# Patient Record
Sex: Female | Born: 1935
Health system: Southern US, Community
[De-identification: ages and names within clinical notes are randomized; demographics above are authoritative.]

## PROBLEM LIST (undated history)

## (undated) DIAGNOSIS — H9190 Unspecified hearing loss, unspecified ear: Secondary | ICD-10-CM

## (undated) DIAGNOSIS — I509 Heart failure, unspecified: Secondary | ICD-10-CM

## (undated) DIAGNOSIS — D500 Iron deficiency anemia secondary to blood loss (chronic): Principal | ICD-10-CM

## (undated) DIAGNOSIS — M545 Low back pain, unspecified: Secondary | ICD-10-CM

## (undated) DIAGNOSIS — C50412 Malignant neoplasm of upper-outer quadrant of left female breast: Secondary | ICD-10-CM

## (undated) DIAGNOSIS — I2510 Atherosclerotic heart disease of native coronary artery without angina pectoris: Secondary | ICD-10-CM

## (undated) DIAGNOSIS — K5790 Diverticulosis of intestine, part unspecified, without perforation or abscess without bleeding: Secondary | ICD-10-CM

## (undated) DIAGNOSIS — E785 Hyperlipidemia, unspecified: Secondary | ICD-10-CM

## (undated) DIAGNOSIS — K909 Intestinal malabsorption, unspecified: Secondary | ICD-10-CM

## (undated) DIAGNOSIS — I4819 Other persistent atrial fibrillation: Secondary | ICD-10-CM

## (undated) DIAGNOSIS — T7840XA Allergy, unspecified, initial encounter: Secondary | ICD-10-CM

## (undated) DIAGNOSIS — Z974 Presence of external hearing-aid: Secondary | ICD-10-CM

## (undated) DIAGNOSIS — D126 Benign neoplasm of colon, unspecified: Secondary | ICD-10-CM

## (undated) DIAGNOSIS — I10 Essential (primary) hypertension: Secondary | ICD-10-CM

## (undated) DIAGNOSIS — Z923 Personal history of irradiation: Secondary | ICD-10-CM

## (undated) DIAGNOSIS — C50919 Malignant neoplasm of unspecified site of unspecified female breast: Secondary | ICD-10-CM

## (undated) DIAGNOSIS — I219 Acute myocardial infarction, unspecified: Secondary | ICD-10-CM

## (undated) DIAGNOSIS — I421 Obstructive hypertrophic cardiomyopathy: Secondary | ICD-10-CM

## (undated) DIAGNOSIS — E039 Hypothyroidism, unspecified: Secondary | ICD-10-CM

## (undated) DIAGNOSIS — M1990 Unspecified osteoarthritis, unspecified site: Secondary | ICD-10-CM

## (undated) DIAGNOSIS — Z86010 Personal history of colonic polyps: Secondary | ICD-10-CM

## (undated) HISTORY — DX: Unspecified osteoarthritis, unspecified site: M19.90

## (undated) HISTORY — DX: Personal history of colonic polyps: Z86.010

## (undated) HISTORY — DX: Essential (primary) hypertension: I10

## (undated) HISTORY — PX: ABDOMINAL HYSTERECTOMY: SHX81

## (undated) HISTORY — DX: Iron deficiency anemia secondary to blood loss (chronic): D50.0

## (undated) HISTORY — PX: BILATERAL SALPINGOOPHORECTOMY: SHX1223

## (undated) HISTORY — DX: Benign neoplasm of colon, unspecified: D12.6

## (undated) HISTORY — DX: Allergy, unspecified, initial encounter: T78.40XA

## (undated) HISTORY — DX: Low back pain: M54.5

## (undated) HISTORY — PX: JOINT REPLACEMENT: SHX530

## (undated) HISTORY — PX: POLYPECTOMY: SHX149

## (undated) HISTORY — DX: Intestinal malabsorption, unspecified: K90.9

## (undated) HISTORY — PX: OTHER SURGICAL HISTORY: SHX169

## (undated) HISTORY — DX: Diverticulosis of intestine, part unspecified, without perforation or abscess without bleeding: K57.90

## (undated) HISTORY — DX: Hypothyroidism, unspecified: E03.9

## (undated) HISTORY — DX: Low back pain, unspecified: M54.50

## (undated) HISTORY — DX: Malignant neoplasm of unspecified site of unspecified female breast: C50.919

---

## 1997-12-31 ENCOUNTER — Ambulatory Visit (HOSPITAL_COMMUNITY): Admission: RE | Admit: 1997-12-31 | Discharge: 1997-12-31 | Payer: Self-pay | Admitting: Obstetrics & Gynecology

## 1999-01-19 ENCOUNTER — Other Ambulatory Visit: Admission: RE | Admit: 1999-01-19 | Discharge: 1999-01-19 | Payer: Self-pay | Admitting: Obstetrics and Gynecology

## 1999-02-02 DIAGNOSIS — Z86010 Personal history of colonic polyps: Secondary | ICD-10-CM

## 1999-02-02 DIAGNOSIS — Z860101 Personal history of adenomatous and serrated colon polyps: Secondary | ICD-10-CM

## 1999-02-02 HISTORY — DX: Personal history of colonic polyps: Z86.010

## 1999-02-02 HISTORY — DX: Personal history of adenomatous and serrated colon polyps: Z86.0101

## 1999-02-27 ENCOUNTER — Other Ambulatory Visit: Admission: RE | Admit: 1999-02-27 | Discharge: 1999-02-27 | Payer: Self-pay | Admitting: Gastroenterology

## 1999-02-27 ENCOUNTER — Encounter: Payer: Self-pay | Admitting: Gastroenterology

## 2000-02-03 ENCOUNTER — Other Ambulatory Visit: Admission: RE | Admit: 2000-02-03 | Discharge: 2000-02-03 | Payer: Self-pay | Admitting: *Deleted

## 2000-02-11 ENCOUNTER — Encounter: Payer: Self-pay | Admitting: *Deleted

## 2000-02-11 ENCOUNTER — Encounter: Admission: RE | Admit: 2000-02-11 | Discharge: 2000-02-11 | Payer: Self-pay | Admitting: *Deleted

## 2001-02-02 ENCOUNTER — Other Ambulatory Visit: Admission: RE | Admit: 2001-02-02 | Discharge: 2001-02-02 | Payer: Self-pay | Admitting: *Deleted

## 2002-01-24 ENCOUNTER — Encounter: Admission: RE | Admit: 2002-01-24 | Discharge: 2002-01-24 | Payer: Self-pay | Admitting: Obstetrics and Gynecology

## 2002-01-24 ENCOUNTER — Encounter: Payer: Self-pay | Admitting: Obstetrics and Gynecology

## 2002-02-06 ENCOUNTER — Other Ambulatory Visit: Admission: RE | Admit: 2002-02-06 | Discharge: 2002-02-06 | Payer: Self-pay | Admitting: Obstetrics and Gynecology

## 2003-02-01 ENCOUNTER — Encounter: Payer: Self-pay | Admitting: *Deleted

## 2003-02-01 ENCOUNTER — Encounter: Admission: RE | Admit: 2003-02-01 | Discharge: 2003-02-01 | Payer: Self-pay | Admitting: *Deleted

## 2003-10-24 ENCOUNTER — Encounter: Payer: Self-pay | Admitting: Gastroenterology

## 2004-11-12 ENCOUNTER — Ambulatory Visit: Payer: Self-pay | Admitting: Family Medicine

## 2004-12-28 ENCOUNTER — Encounter: Admission: RE | Admit: 2004-12-28 | Discharge: 2004-12-28 | Payer: Self-pay | Admitting: Obstetrics and Gynecology

## 2005-01-07 ENCOUNTER — Encounter: Admission: RE | Admit: 2005-01-07 | Discharge: 2005-01-07 | Payer: Self-pay | Admitting: Obstetrics and Gynecology

## 2005-07-05 ENCOUNTER — Encounter: Admission: RE | Admit: 2005-07-05 | Discharge: 2005-07-05 | Payer: Self-pay | Admitting: Obstetrics and Gynecology

## 2006-01-10 ENCOUNTER — Encounter: Admission: RE | Admit: 2006-01-10 | Discharge: 2006-01-10 | Payer: Self-pay | Admitting: Obstetrics and Gynecology

## 2006-01-18 ENCOUNTER — Ambulatory Visit: Payer: Self-pay | Admitting: Family Medicine

## 2006-02-07 ENCOUNTER — Ambulatory Visit: Payer: Self-pay | Admitting: Family Medicine

## 2006-02-10 ENCOUNTER — Ambulatory Visit: Payer: Self-pay

## 2006-02-10 ENCOUNTER — Encounter: Payer: Self-pay | Admitting: Cardiology

## 2006-06-30 ENCOUNTER — Ambulatory Visit: Payer: Self-pay | Admitting: Family Medicine

## 2006-09-15 ENCOUNTER — Ambulatory Visit: Payer: Self-pay | Admitting: Family Medicine

## 2006-09-29 ENCOUNTER — Ambulatory Visit: Payer: Self-pay | Admitting: Family Medicine

## 2006-10-24 ENCOUNTER — Encounter: Admission: RE | Admit: 2006-10-24 | Discharge: 2006-10-24 | Payer: Self-pay | Admitting: Orthopaedic Surgery

## 2006-11-12 LAB — HM MAMMOGRAPHY

## 2006-11-18 ENCOUNTER — Ambulatory Visit: Payer: Self-pay | Admitting: Family Medicine

## 2006-11-22 ENCOUNTER — Ambulatory Visit (HOSPITAL_BASED_OUTPATIENT_CLINIC_OR_DEPARTMENT_OTHER): Admission: RE | Admit: 2006-11-22 | Discharge: 2006-11-22 | Payer: Self-pay | Admitting: Orthopaedic Surgery

## 2006-11-22 HISTORY — PX: EYE SURGERY: SHX253

## 2006-11-30 ENCOUNTER — Ambulatory Visit: Payer: Self-pay | Admitting: Gastroenterology

## 2006-12-13 ENCOUNTER — Encounter (INDEPENDENT_AMBULATORY_CARE_PROVIDER_SITE_OTHER): Payer: Self-pay | Admitting: Specialist

## 2006-12-13 ENCOUNTER — Ambulatory Visit: Payer: Self-pay | Admitting: Gastroenterology

## 2006-12-13 LAB — HM COLONOSCOPY

## 2007-01-18 ENCOUNTER — Encounter: Admission: RE | Admit: 2007-01-18 | Discharge: 2007-01-18 | Payer: Self-pay | Admitting: Family Medicine

## 2007-03-16 ENCOUNTER — Ambulatory Visit: Payer: Self-pay | Admitting: Family Medicine

## 2007-03-16 LAB — CONVERTED CEMR LAB: TSH: 0.2 microintl units/mL — ABNORMAL LOW (ref 0.35–5.50)

## 2007-06-19 ENCOUNTER — Telehealth: Payer: Self-pay | Admitting: Family Medicine

## 2007-06-22 ENCOUNTER — Ambulatory Visit: Payer: Self-pay | Admitting: Family Medicine

## 2007-06-22 DIAGNOSIS — E039 Hypothyroidism, unspecified: Secondary | ICD-10-CM | POA: Insufficient documentation

## 2007-06-27 DIAGNOSIS — J309 Allergic rhinitis, unspecified: Secondary | ICD-10-CM | POA: Insufficient documentation

## 2007-06-27 LAB — CONVERTED CEMR LAB: TSH: 1.23 microintl units/mL (ref 0.35–5.50)

## 2007-06-28 ENCOUNTER — Telehealth: Payer: Self-pay | Admitting: Family Medicine

## 2007-10-25 ENCOUNTER — Encounter: Payer: Self-pay | Admitting: Family Medicine

## 2007-11-27 ENCOUNTER — Telehealth: Payer: Self-pay | Admitting: Family Medicine

## 2007-12-11 ENCOUNTER — Telehealth: Payer: Self-pay | Admitting: Family Medicine

## 2007-12-11 ENCOUNTER — Ambulatory Visit: Payer: Self-pay | Admitting: Family Medicine

## 2007-12-11 DIAGNOSIS — J019 Acute sinusitis, unspecified: Secondary | ICD-10-CM | POA: Insufficient documentation

## 2007-12-13 LAB — CONVERTED CEMR LAB
ALT: 15 units/L (ref 0–35)
AST: 14 units/L (ref 0–37)
Albumin: 3.8 g/dL (ref 3.5–5.2)
Alkaline Phosphatase: 61 units/L (ref 39–117)
BUN: 14 mg/dL (ref 6–23)
Basophils Absolute: 0 10*3/uL (ref 0.0–0.1)
Basophils Relative: 0.2 % (ref 0.0–1.0)
Bilirubin, Direct: 0.1 mg/dL (ref 0.0–0.3)
CO2: 31 meq/L (ref 19–32)
Calcium: 9.2 mg/dL (ref 8.4–10.5)
Chloride: 103 meq/L (ref 96–112)
Creatinine, Ser: 1 mg/dL (ref 0.4–1.2)
Eosinophils Absolute: 0.3 10*3/uL (ref 0.0–0.6)
Eosinophils Relative: 4.6 % (ref 0.0–5.0)
GFR calc Af Amer: 70 mL/min
GFR calc non Af Amer: 58 mL/min
Glucose, Bld: 91 mg/dL (ref 70–99)
HCT: 38.4 % (ref 36.0–46.0)
Hemoglobin: 13.1 g/dL (ref 12.0–15.0)
Lymphocytes Relative: 25.5 % (ref 12.0–46.0)
MCHC: 34.1 g/dL (ref 30.0–36.0)
MCV: 90.4 fL (ref 78.0–100.0)
Monocytes Absolute: 0.2 10*3/uL (ref 0.2–0.7)
Monocytes Relative: 4.3 % (ref 3.0–11.0)
Neutro Abs: 3.6 10*3/uL (ref 1.4–7.7)
Neutrophils Relative %: 65.4 % (ref 43.0–77.0)
Platelets: 142 10*3/uL — ABNORMAL LOW (ref 150–400)
Potassium: 4.2 meq/L (ref 3.5–5.1)
RBC: 4.25 M/uL (ref 3.87–5.11)
RDW: 12.4 % (ref 11.5–14.6)
Sodium: 138 meq/L (ref 135–145)
TSH: 1.66 microintl units/mL (ref 0.35–5.50)
Total Bilirubin: 0.8 mg/dL (ref 0.3–1.2)
Total Protein: 6.3 g/dL (ref 6.0–8.3)
WBC: 5.5 10*3/uL (ref 4.5–10.5)

## 2008-01-19 ENCOUNTER — Encounter: Admission: RE | Admit: 2008-01-19 | Discharge: 2008-01-19 | Payer: Self-pay | Admitting: Family Medicine

## 2008-05-15 ENCOUNTER — Telehealth (INDEPENDENT_AMBULATORY_CARE_PROVIDER_SITE_OTHER): Payer: Self-pay | Admitting: *Deleted

## 2008-11-01 DIAGNOSIS — Z860100 Personal history of colon polyps, unspecified: Secondary | ICD-10-CM | POA: Insufficient documentation

## 2008-11-04 ENCOUNTER — Ambulatory Visit: Payer: Self-pay | Admitting: Gastroenterology

## 2008-11-04 DIAGNOSIS — K5900 Constipation, unspecified: Secondary | ICD-10-CM | POA: Insufficient documentation

## 2009-01-15 ENCOUNTER — Telehealth (INDEPENDENT_AMBULATORY_CARE_PROVIDER_SITE_OTHER): Payer: Self-pay | Admitting: *Deleted

## 2009-01-15 ENCOUNTER — Telehealth: Payer: Self-pay | Admitting: Family Medicine

## 2009-01-20 ENCOUNTER — Encounter: Admission: RE | Admit: 2009-01-20 | Discharge: 2009-01-20 | Payer: Self-pay | Admitting: Family Medicine

## 2009-04-22 ENCOUNTER — Ambulatory Visit: Payer: Self-pay | Admitting: Family Medicine

## 2009-04-22 DIAGNOSIS — M545 Low back pain, unspecified: Secondary | ICD-10-CM | POA: Insufficient documentation

## 2009-04-23 LAB — CONVERTED CEMR LAB
ALT: 11 units/L (ref 0–35)
AST: 15 units/L (ref 0–37)
Albumin: 3.9 g/dL (ref 3.5–5.2)
Alkaline Phosphatase: 69 units/L (ref 39–117)
BUN: 13 mg/dL (ref 6–23)
Basophils Absolute: 0 10*3/uL (ref 0.0–0.1)
Basophils Relative: 1 % (ref 0.0–3.0)
Bilirubin, Direct: 0.1 mg/dL (ref 0.0–0.3)
CO2: 30 meq/L (ref 19–32)
Calcium: 9.4 mg/dL (ref 8.4–10.5)
Chloride: 106 meq/L (ref 96–112)
Cholesterol: 216 mg/dL — ABNORMAL HIGH (ref 0–200)
Creatinine, Ser: 0.8 mg/dL (ref 0.4–1.2)
Direct LDL: 150.1 mg/dL
Eosinophils Absolute: 0.1 10*3/uL (ref 0.0–0.7)
Eosinophils Relative: 3.5 % (ref 0.0–5.0)
GFR calc non Af Amer: 74.86 mL/min (ref >60)
Glucose, Bld: 91 mg/dL (ref 70–99)
HCT: 34.8 % — ABNORMAL LOW (ref 36.0–46.0)
HDL: 39.4 mg/dL (ref >39.00)
Hemoglobin: 11.8 g/dL — ABNORMAL LOW (ref 12.0–15.0)
Lymphocytes Relative: 26.3 % (ref 12.0–46.0)
Lymphs Abs: 1.1 10*3/uL (ref 0.7–4.0)
MCHC: 33.9 g/dL (ref 30.0–36.0)
MCV: 83.6 fL (ref 78.0–100.0)
Monocytes Absolute: 0.4 10*3/uL (ref 0.1–1.0)
Monocytes Relative: 9.6 % (ref 3.0–12.0)
Neutro Abs: 2.5 10*3/uL (ref 1.4–7.7)
Neutrophils Relative %: 59.6 % (ref 43.0–77.0)
Platelets: 108 10*3/uL — ABNORMAL LOW (ref 150.0–400.0)
Potassium: 4.2 meq/L (ref 3.5–5.1)
RBC: 4.17 M/uL (ref 3.87–5.11)
RDW: 15.5 % — ABNORMAL HIGH (ref 11.5–14.6)
Sodium: 140 meq/L (ref 135–145)
TSH: 0.92 microintl units/mL (ref 0.35–5.50)
Total Bilirubin: 0.9 mg/dL (ref 0.3–1.2)
Total CHOL/HDL Ratio: 5
Total Protein: 7 g/dL (ref 6.0–8.3)
Triglycerides: 110 mg/dL (ref 0.0–149.0)
VLDL: 22 mg/dL (ref 0.0–40.0)
WBC: 4.1 10*3/uL — ABNORMAL LOW (ref 4.5–10.5)

## 2009-06-25 ENCOUNTER — Encounter: Payer: Self-pay | Admitting: Family Medicine

## 2009-08-11 ENCOUNTER — Ambulatory Visit: Payer: Self-pay | Admitting: Family Medicine

## 2009-08-20 ENCOUNTER — Telehealth: Payer: Self-pay | Admitting: Family Medicine

## 2009-08-20 ENCOUNTER — Ambulatory Visit: Payer: Self-pay | Admitting: Family Medicine

## 2009-08-21 ENCOUNTER — Encounter: Payer: Self-pay | Admitting: Family Medicine

## 2009-08-22 ENCOUNTER — Telehealth: Payer: Self-pay | Admitting: Family Medicine

## 2009-08-25 ENCOUNTER — Encounter (INDEPENDENT_AMBULATORY_CARE_PROVIDER_SITE_OTHER): Payer: Self-pay | Admitting: *Deleted

## 2009-08-26 ENCOUNTER — Telehealth: Payer: Self-pay | Admitting: Family Medicine

## 2009-09-01 ENCOUNTER — Encounter: Payer: Self-pay | Admitting: Family Medicine

## 2009-09-08 ENCOUNTER — Encounter (INDEPENDENT_AMBULATORY_CARE_PROVIDER_SITE_OTHER): Payer: Self-pay | Admitting: *Deleted

## 2009-09-17 ENCOUNTER — Encounter (INDEPENDENT_AMBULATORY_CARE_PROVIDER_SITE_OTHER): Payer: Self-pay | Admitting: *Deleted

## 2009-11-12 ENCOUNTER — Encounter (INDEPENDENT_AMBULATORY_CARE_PROVIDER_SITE_OTHER): Payer: Self-pay | Admitting: *Deleted

## 2009-11-17 ENCOUNTER — Encounter (INDEPENDENT_AMBULATORY_CARE_PROVIDER_SITE_OTHER): Payer: Self-pay | Admitting: *Deleted

## 2009-12-02 DIAGNOSIS — D126 Benign neoplasm of colon, unspecified: Secondary | ICD-10-CM

## 2009-12-02 HISTORY — DX: Benign neoplasm of colon, unspecified: D12.6

## 2009-12-15 ENCOUNTER — Encounter (INDEPENDENT_AMBULATORY_CARE_PROVIDER_SITE_OTHER): Payer: Self-pay

## 2009-12-16 ENCOUNTER — Ambulatory Visit: Payer: Self-pay | Admitting: Gastroenterology

## 2009-12-22 ENCOUNTER — Telehealth: Payer: Self-pay | Admitting: Gastroenterology

## 2009-12-24 ENCOUNTER — Ambulatory Visit: Payer: Self-pay | Admitting: Gastroenterology

## 2009-12-25 ENCOUNTER — Encounter: Payer: Self-pay | Admitting: Gastroenterology

## 2009-12-31 ENCOUNTER — Ambulatory Visit: Payer: Self-pay | Admitting: Gastroenterology

## 2009-12-31 ENCOUNTER — Inpatient Hospital Stay (HOSPITAL_COMMUNITY): Admission: EM | Admit: 2009-12-31 | Discharge: 2010-01-03 | Payer: Self-pay | Admitting: Emergency Medicine

## 2009-12-31 ENCOUNTER — Telehealth: Payer: Self-pay | Admitting: Gastroenterology

## 2010-01-02 ENCOUNTER — Encounter: Payer: Self-pay | Admitting: Gastroenterology

## 2010-01-02 ENCOUNTER — Telehealth: Payer: Self-pay | Admitting: Gastroenterology

## 2010-01-09 ENCOUNTER — Telehealth: Payer: Self-pay | Admitting: Gastroenterology

## 2010-01-14 ENCOUNTER — Ambulatory Visit: Payer: Self-pay | Admitting: Gastroenterology

## 2010-01-14 LAB — CONVERTED CEMR LAB
Basophils Absolute: 0 10*3/uL (ref 0.0–0.1)
Basophils Relative: 0.8 % (ref 0.0–3.0)
Eosinophils Absolute: 0.2 10*3/uL (ref 0.0–0.7)
Eosinophils Relative: 4 % (ref 0.0–5.0)
HCT: 31.9 % — ABNORMAL LOW (ref 36.0–46.0)
Hemoglobin: 11 g/dL — ABNORMAL LOW (ref 12.0–15.0)
Lymphocytes Relative: 28 % (ref 12.0–46.0)
Lymphs Abs: 1.4 10*3/uL (ref 0.7–4.0)
MCHC: 34.4 g/dL (ref 30.0–36.0)
MCV: 81.8 fL (ref 78.0–100.0)
Monocytes Absolute: 0.4 10*3/uL (ref 0.1–1.0)
Monocytes Relative: 8.6 % (ref 3.0–12.0)
Neutro Abs: 2.9 10*3/uL (ref 1.4–7.7)
Neutrophils Relative %: 58.6 % (ref 43.0–77.0)
Platelets: 170 10*3/uL (ref 150.0–400.0)
RBC: 3.9 M/uL (ref 3.87–5.11)
RDW: 15.8 % — ABNORMAL HIGH (ref 11.5–14.6)
WBC: 4.9 10*3/uL (ref 4.5–10.5)

## 2010-01-21 ENCOUNTER — Encounter: Admission: RE | Admit: 2010-01-21 | Discharge: 2010-01-21 | Payer: Self-pay | Admitting: Family Medicine

## 2010-07-20 ENCOUNTER — Ambulatory Visit: Payer: Self-pay | Admitting: Gastroenterology

## 2010-07-20 LAB — CONVERTED CEMR LAB
Basophils Absolute: 0 10*3/uL (ref 0.0–0.1)
Basophils Relative: 0.7 % (ref 0.0–3.0)
Eosinophils Absolute: 0.1 10*3/uL (ref 0.0–0.7)
Eosinophils Relative: 2.5 % (ref 0.0–5.0)
HCT: 38.4 % (ref 36.0–46.0)
Hemoglobin: 13.4 g/dL (ref 12.0–15.0)
Lymphocytes Relative: 23.8 % (ref 12.0–46.0)
Lymphs Abs: 1.3 10*3/uL (ref 0.7–4.0)
MCHC: 34.9 g/dL (ref 30.0–36.0)
MCV: 89.2 fL (ref 78.0–100.0)
Monocytes Absolute: 0.5 10*3/uL (ref 0.1–1.0)
Monocytes Relative: 8.6 % (ref 3.0–12.0)
Neutro Abs: 3.4 10*3/uL (ref 1.4–7.7)
Neutrophils Relative %: 64.4 % (ref 43.0–77.0)
Platelets: 135 10*3/uL — ABNORMAL LOW (ref 150.0–400.0)
RBC: 4.3 M/uL (ref 3.87–5.11)
RDW: 13.5 % (ref 11.5–14.6)
WBC: 5.3 10*3/uL (ref 4.5–10.5)

## 2010-08-07 ENCOUNTER — Telehealth: Payer: Self-pay | Admitting: Family Medicine

## 2010-08-07 DIAGNOSIS — H9190 Unspecified hearing loss, unspecified ear: Secondary | ICD-10-CM | POA: Insufficient documentation

## 2010-08-21 ENCOUNTER — Encounter: Payer: Self-pay | Admitting: Family Medicine

## 2010-10-04 HISTORY — PX: CATARACT EXTRACTION W/ INTRAOCULAR LENS  IMPLANT, BILATERAL: SHX1307

## 2010-11-03 NOTE — Progress Notes (Signed)
Summary: Sooner appt.  Phone Note Other Incoming   Caller: Jennye Moccasin   [phone] Summary of Call: Needs to sch'd 2 wk hospital f/u w/Dr. Russella Dar. Cannot wait until next avail 02-11-10. Initial call taken by: Karna Christmas,  January 02, 2010 4:47 PM  Follow-up for Phone Call        Sarah ntfd. that i will talk to [facility] on Monday and we will call pt with appt. Follow-up by: Teryl Lucy RN,  January 02, 2010 5:01 PM  Additional Follow-up for Phone Call Additional follow up Details #1::        Patient  aware of appointment scheduled for 01-14-10 8:30 Additional Follow-up by: Darcey Nora RN, CGRN,  January 05, 2010 9:31 AM

## 2010-11-03 NOTE — Letter (Signed)
Summary: Previsit letter  [facility]  [address] [place], [place] [address]   Phone: [phone]  Fax: [phone]       11/17/2009 MRN: [id number]  [facility] [address] [place], [place]  [address]  Dear Ms. Maurine Cane,  Welcome to the Gastroenterology Division at [facility].    You are scheduled to see a nurse for your pre-procedure visit on 12/16/2009 at 8:00AM on the 3rd floor at [facility], [address]  We ask that you try to arrive at our office 15 minutes prior to your appointment time to allow for check-in.  Your nurse visit will consist of discussing your medical and surgical history, your immediate family medical history, and your medications.    Please bring a complete list of all your medications or, if you prefer, bring the medication bottles and we will list them.  We will need to be aware of both prescribed and over the counter drugs.  We will need to know exact dosage information as well.  If you are on blood thinners (Coumadin, Plavix, Aggrenox, Ticlid, etc.) please call our office today/prior to your appointment, as we need to consult with your physician about holding your medication.   Please be prepared to read and sign documents such as consent forms, a financial agreement, and acknowledgement forms.  If necessary, and with your consent, a friend or relative is welcome to sit-in on the nurse visit with you.  Please bring your insurance card so that we may make a copy of it.  If your insurance requires a referral to see a specialist, please bring your referral form from your primary care physician.  No co-pay is required for this nurse visit.     If you cannot keep your appointment, please call [phone] to cancel or reschedule prior to your appointment date.  This allows [place] the opportunity to schedule an appointment for another patient in need of care.    Thank you for choosing [facility] for your medical  needs.  We appreciate the opportunity to care for you.  Please visit [place] at our website  to learn more about our practice.                     Sincerely.                                                                                                                   The Gastroenterology Division

## 2010-11-03 NOTE — Letter (Signed)
Summary: Patient Notice- Polyp Results  [facility]  [address] [place], [place] [address]   Phone: [phone]  Fax: [phone]        December 25, 2009 MRN: [id number]    [facility] [address] [place], [place]  [address]    Dear Ms. Menter,  I am pleased to inform you that the colon polyp(s) removed during your recent colonoscopy was (were) found to be benign (no cancer detected) upon pathologic examination.  I recommend you have a repeat colonoscopy examination in 2 years to look for recurrent polyps, as having colon polyps increases your risk for having recurrent polyps or even colon cancer in the future.  Should you develop new or worsening symptoms of abdominal pain, bowel habit changes or bleeding from the rectum or bowels, please schedule an evaluation with either your primary care physician or with me.  Continue treatment plan as outlined the day of your exam.  Please call us if you are having persistent problems or have questions about your condition that have not been fully answered at this time.  Sincerely,  Meryl Dare MD [facility]  This letter has been electronically signed by your physician.  Appended Document: Patient Notice- Polyp Results letter mailed 3.28.11

## 2010-11-03 NOTE — Assessment & Plan Note (Signed)
Summary: post hospital/post polypectomy bleed/sheri   History of Present Illness Visit Type: Follow-up Visit Primary GI MD: Elie Goody MD [facility] Primary Provider: Shellia Carwin, MD Chief Complaint: Post hospitalization for polypectomy bleed pt. does c/o fatigue History of Present Illness:   This is a 76 year old female returning for followup after hospitalization for post-polypectomy bleed. She has done well since discharge, with no complaints except for mild fatigue. Her discharge hemoglobin was 10.2. I have reviewed the discharge summary and inpatient colonoscopy report.   GI Review of Systems      Denies abdominal pain, acid reflux, belching, bloating, chest pain, dysphagia with liquids, dysphagia with solids, heartburn, loss of appetite, nausea, vomiting, vomiting blood, weight loss, and  weight gain.        Denies anal fissure, black tarry stools, change in bowel habit, constipation, diarrhea, diverticulosis, fecal incontinence, heme positive stool, hemorrhoids, irritable bowel syndrome, jaundice, light color stool, liver problems, rectal bleeding, and  rectal pain.   Current Medications (verified): 1)  Synthroid 125 Mcg  Tabs (Levothyroxine Sodium) .... Once Daily 2)  Vitamin B High Potency .Marland Kitchen.. 1 Tablet By Mouth Once Daily  Allergies (verified): 1)  ! Penicillin V Potassium (Penicillin V Potassium) 2)  ! Darvocet  Past History:  Past Medical History: Allergic rhinitis Hypothyroidism Urinary Tract Infection Diverticulosis Adenomatous Colon Polyps, 02/1999 and Tubulovillous adenoma with HGD, 12/2009 Low back pain, gets ESI from Dr. Sharolyn Douglas sees Debbora Dus NP for GYN exams  Past Surgical History: Reviewed history from 04/22/2009 and no changes required. Hysterectomy Oophorectomy, salpingectomy--bilateral ECHO  11/01 Colonscopy 12-13-06 per Dr. Russella Dar Bone density 2000 Mammogram 2008  Family History: Reviewed history from 11/04/2008 and no changes required. Family  History of Alcoholism/Addiction Family History Kidney failure Family History of Colon Polyps: Daughter No FH of Colon Cancer:  Social History: Reviewed history from 11/04/2008 and no changes required. Married Never Smoked Alcohol use-no Daily Caffeine Use Occupation: retired Illicit Drug Use - no Drug Use:  no  Review of Systems       The patient complains of fatigue.         The pertinent positives and negatives are noted as above and in the HPI. All other ROS were reviewed and were negative.   Vital Signs:  Patient profile:   76 year old female Height:      68 inches Weight:      175 pounds BMI:     26.70 Pulse rate:   80 / minute Pulse rhythm:   regular BP sitting:   108 / 62  (left arm)  Vitals Entered By: Milford Cage NCMA (January 14, 2010 8:35 AM)  Physical Exam  General:  Well developed, well nourished, no acute distress. Head:  Normocephalic and atraumatic. Eyes:  PERRLA, no icterus. Mouth:  No deformity or lesions, dentition normal. Lungs:  Clear throughout to auscultation. Heart:  Regular rate and rhythm; no murmurs, rubs,  or bruits. Abdomen:  Soft, nontender and nondistended. No masses, hepatosplenomegaly or hernias noted. Normal bowel sounds. Psych:  Alert and cooperative. Normal mood and affect.  Impression & Recommendations:  Problem # 1:  ACUTE POSTHEMORRHAGIC ANEMIA (ICD-285.1) Acute blood loss anemia, secondary to a post polypectomy bleed. Repeat CBC today. Begin iron supplements twice daily for 4 weeks and repeat CBC in 6 weeks. Orders: TLB-CBC Platelet - w/Differential ([id number]-[id number])  Problem # 2:  COLONIC POLYPS, HX OF (ICD-V12.72) Prior history of adenomatous colon polyps. Recent tubulovillous adenoma with high-grade dysplasia. Surveillance colonoscopy March 2013.  Patient Instructions: 1)  Get your labs drawn today in the basement. 2)  Start Iron one tablet by mouth two times a day x 1 month and the prescription is at your pharmacy.  3)   We will contact you in 6 months to get your CBC drawn. 4)  Copy sent to : Gershon Crane, MD 5)  The medication list was reviewed and reconciled.  All changed / newly prescribed medications were explained.  A complete medication list was provided to the patient / caregiver.  Prescriptions: FERROUS SULFATE 325 (65 FE) MG TABS (FERROUS SULFATE) one tablet by mouth two times a day  #60 x 0   Entered by:   Christie Nottingham CMA (AAMA)   Authorized by:   Meryl Dare MD [facility]   Signed by:   Meryl Dare MD FACG on 01/14/2010   Method used:   [facility]ically to        [id number]       [address]       [place], [place]  [address]       Ph: [phone]       Fax: [phone]   RxID:   [id number]

## 2010-11-03 NOTE — Progress Notes (Signed)
Summary: speak to nurse  Phone Note Call from Patient Call back at Home Phone [phone]   Caller: Patient Call For: Russella Dar Reason for Call: Talk to Nurse Summary of Call: Patient wants to speak to nurse because she wants to have blood work check instead of ov with Dr Russella Dar 4-13 Initial call taken by: Tawni Levy,  January 09, 2010 1:36 PM  Follow-up for Phone Call        Left message for patient to call back Darcey Nora RN, [facility]  January 09, 2010 3:31 PM  Patient  states she was called from [facility] saying she doesn't need her appointment for 01/14/10.  Patient  advised to keep her appointment with Dr Russella Dar. Follow-up by: Darcey Nora RN, CGRN,  January 09, 2010 3:55 PM

## 2010-11-03 NOTE — Miscellaneous (Signed)
Summary: Lec previsit  Clinical Lists Changes  Medications: Added new medication of MOVIPREP 100 GM  SOLR (PEG-KCL-NACL-NASULF-NA ASC-C) As per prep instructions. - Signed Rx of MOVIPREP 100 GM  SOLR (PEG-KCL-NACL-NASULF-NA ASC-C) As per prep instructions.;  #1 x 0;  Signed;  Entered by: Ulis Rias RN;  Authorized by: Meryl Dare MD [facility];  Method used: [facility] [address] [id number] [address], [place], [place], [place]  [address], Ph: [phone], Fax: [phone] Observations: Added new observation of ALLERGY REV: Done (12/16/2009 7:47)    Prescriptions: MOVIPREP 100 GM  SOLR (PEG-KCL-NACL-NASULF-NA ASC-C) As per prep instructions.  #1 x 0   Entered by:   Ulis Rias RN   Authorized by:   Meryl Dare MD [facility]   Signed by:   Ulis Rias RN on 12/16/2009   Method used:   [facility]        [facility]  [address] [id number]       [address]       [place], [place]  [address]       Ph: [phone]       Fax: [phone]   RxID:   [id number]

## 2010-11-03 NOTE — Progress Notes (Signed)
Summary: Triage  Phone Note Call from Patient Call back at Home Phone [phone]   Caller: Patient Call For: Dr. Russella Dar Reason for Call: Talk to Nurse Summary of Call: Pt. is scheduled for colonoscopy for Wed. Has had diarrhea since yesterday and is weak. Initial call taken by: Karna Christmas,  December 22, 2009 8:24 AM  Follow-up for Phone Call        Patient misunderstood her prep and has been on a clear liquid diet since Friday.  I have advised her she may have a diet today avaoiding the foods listed in the instructions.  She doesn't need to be on a clear liquid diet until tomorrow. I reviewed her prep instructions with her again she verbalized understanding. Follow-up by: Darcey Nora RN, CGRN,  December 22, 2009 10:45 AM

## 2010-11-03 NOTE — Procedures (Signed)
Summary: Colonoscopy  Patient: Kim [name] Note: All result statuses are Final unless otherwise noted.  Tests: (1) Colonoscopy (COL)   COL Colonoscopy           DONE      [facility]     [address]     [place], [place]  [address]           COLONOSCOPY PROCEDURE REPORT           PATIENT:  [name], [name]  MR#:  [id number]     BIRTHDATE:  1935/07/06, 75 yrs. old  GENDER:  female     ENDOSCOPIST:  Barbette Hair. Arlyce Dice, MD     REF. BY:     PROCEDURE DATE:  01/02/2010     PROCEDURE:  Colonoscopy for control of bleeding     ASA CLASS:  Class II     INDICATIONS:  hematochezia 1 week post polypectomy of 3 polyps in     cecum with cautery; s/p sigmoid polypectomies with cold snare     MEDICATIONS:   Fentanyl 100 mcg, Versed 10 mg, Benadryl 50 IV           DESCRIPTION OF PROCEDURE:   After the risks benefits and     alternatives of the procedure were thoroughly explained, informed     consent was obtained.  No rectal exam performed. The [id number]     ([id number]) endoscope was introduced through the anus and advanced     to the cecum, which was identified by the ileocecal valve, limited     by poor preparation.  Moderate amount of puddy-like melenic stool     throughout colon  The quality of the prep was Miralax fair.  The     instrument was then slowly withdrawn as the colon was fully     examined.     <<PROCEDUREIMAGES>>           FINDINGS:  ulcers in the cecum. 2 discreet 1-1.2cm ulcers     corresponding to previous polypectomy sites. Both were oozing     minimal amounts of blood. Each was cauterized successfully with     the APC.     A 3rd clean based ulcer was seen in the cecum corresponding to     another polypectomy site (see image002, image006, image009, and     image010).  Moderate diverticulosis was found in the sigmoid colon     (see image012).  This was otherwise a normal examination of the     colon (see image013).   Retroflexed views in the rectum  revealed     no abnormalities.    The scope was then withdrawn from the patient     and the procedure completed.           COMPLICATIONS:  None     ENDOSCOPIC IMPRESSION:     1) Bleeding Ulcers in the cecum corresponding to previous     polypectomy sites; s/p successfull cauterization     2) Moderate diverticulosis in the sigmoid colon     3) Otherwise normal examination           RECOMMENDATIONS:No ASA or NSAIDS for 2 weeks           REPEAT EXAM:  No           ______________________________     Barbette Hair. Arlyce Dice, MD           CC:  Claudette Head, MD  n.     eSIGNED:   Barbette Hair. Storey Stangeland at 01/02/2010 04:38 PM           Jeri Modena, [id number]  Note: An exclamation mark (!) indicates a result that was not dispersed into the flowsheet. Document Creation Date: 01/02/2010 4:39 PM _______________________________________________________________________  (1) Order result status: Final Collection or observation date-time: 01/02/2010 16:29 Requested date-time:  Receipt date-time:  Reported date-time:  Referring Physician:   Ordering Physician: Melvia Heaps [phone]) Specimen Source:  Source: Launa Grill Order Number: [id number] Lab site:

## 2010-11-03 NOTE — Progress Notes (Signed)
  Phone Note Call from Patient   Caller: Patient Summary of Call: She called tonight at 7:45pm.  HAs been doing well since colonoscopy last week (7 days ago, several polyps removed some with cautery).  Regular BMs daily.  Two hours ago began to have painless, bright red rectal bleeding.  Has had 4-5 bleeding episodes in 2 hours and has feeling that she has more to go.  I instructed her to have someonw drive [facility] (she wanted to take a shower first but I advised her not to for fear of orthostatic symptoms in slippery shower).  I called ahead to ER.  PLan is to admit, labs, type and cross, transfuse if needed, start a split dose prep tonight, second dose to start tomorrow morning.  If she stops bleeding, no need for colonoscopy.  If she continues to bleed during prep, proceed with colonsocopy.  Likely post-polypectomy bleeding. Initial call taken by: Rachael Fee MD,  December 31, 2009 8:26 PM

## 2010-11-03 NOTE — Progress Notes (Signed)
Summary: referral  Phone Note Call from Patient Call back at Home Phone [phone]   Caller: Patient Call For: Nelwyn Salisbury MD Reason for Call: Referral Summary of Call: patient is calling because she would like a referral to see Dr Marciano Sequin to have her hearing checked again.  Is this okay to give? Initial call taken by: Kern Reap CMA Duncan Dull),  August 07, 2010 4:26 PM  Follow-up for Phone Call        please do so Follow-up by: Nelwyn Salisbury MD,  August 07, 2010 4:39 PM  New Problems: UNSPECIFIED HEARING LOSS (ICD-389.9)   New Problems: UNSPECIFIED HEARING LOSS (ICD-389.9)

## 2010-11-03 NOTE — Letter (Signed)
Summary: Colonoscopy Letter  [facility]  [address] [place], [place] [address]   Phone: [phone]  Fax: [phone]      November 12, 2009 MRN: [id number]   [facility] [address] [place], [place]  [address]   Dear Ms. [facility],   According to your medical record, it is time for you to schedule a Colonoscopy. The American Cancer Society recommends this procedure as a method to detect early colon cancer. Patients with a family history of colon cancer, or a personal history of colon polyps or inflammatory bowel disease are at increased risk.  This letter has beeen generated based on the recommendations made at the time of your procedure. If you feel that in your particular situation this may no longer apply, please contact our office.  Please call our office at [phone] to schedule this appointment or to update your records at your earliest convenience.  Thank you for cooperating with [place] to provide you with the very best care possible.   Sincerely,  Judie Petit T. Russella Dar, M.D.  [facility] Gastroenterology Division [phone]

## 2010-11-03 NOTE — Letter (Signed)
Summary: [facility] Instructions  [facility]  [address] [place], [place] [address]   Phone: [phone]  Fax: [phone]       Kim [name]    [birth year]    MRN: [id number]        Procedure Day /Date:  12/24/09  Wednesday     Arrival Time:  9:30am     Procedure Time:  10:30am     Location of Procedure:                    _ x_   Endoscopy Center (4th Floor)   PREPARATION FOR COLONOSCOPY WITH MOVIPREP   Starting 5 days prior to your procedure _3/18/11 _ do not eat nuts, seeds, popcorn, corn, beans, peas,  salads, or any raw vegetables.  Do not take any fiber supplements (e.g. Metamucil, Citrucel, and Benefiber).  THE DAY BEFORE YOUR PROCEDURE         DATE:  12/23/09  DAY:  Tuesday  1.  Drink clear liquids the entire day-NO SOLID FOOD  2.  Do not drink anything colored red or purple.  Avoid juices with pulp.  No orange juice.  3.  Drink at least 64 oz. (8 glasses) of fluid/clear liquids during the day to prevent dehydration and help the prep work efficiently.  CLEAR LIQUIDS INCLUDE: Water Jello Ice Popsicles Tea (sugar ok, no milk/cream) Powdered fruit flavored drinks Coffee (sugar ok, no milk/cream) Gatorade Juice: apple, white grape, white cranberry  Lemonade Clear bullion, consomm, broth Carbonated beverages (any kind) Strained chicken noodle soup Hard Candy                             4.  In the morning, mix first dose of MoviPrep solution:    Empty 1 Pouch A and 1 Pouch B into the disposable container    Add lukewarm drinking water to the top line of the container. Mix to dissolve    Refrigerate (mixed solution should be used within 24 hrs)  5.  Begin drinking the prep at 5:00 p.m. The MoviPrep container is divided by 4 marks.   Every 15 minutes drink the solution down to the next mark (approximately 8 oz) until the full liter is complete.   6.  Follow completed prep with 16 oz of clear liquid of your choice (Nothing red or  purple).  Continue to drink clear liquids until bedtime.  7.  Before going to bed, mix second dose of MoviPrep solution:    Empty 1 Pouch A and 1 Pouch B into the disposable container    Add lukewarm drinking water to the top line of the container. Mix to dissolve    Refrigerate  THE DAY OF YOUR PROCEDURE      DATE:  12/24/09  DAY:  Wednesday  Beginning at  5:30 a.m. (5 hours before procedure):         1. Every 15 minutes, drink the solution down to the next mark (approx 8 oz) until the full liter is complete.  2. Follow completed prep with 16 oz. of clear liquid of your choice.    3. You may drink clear liquids until  8:30am  (2 HOURS BEFORE PROCEDURE).   MEDICATION INSTRUCTIONS  Unless otherwise instructed, you should take regular prescription medications with a small sip of water   as early as possible the morning of your procedure.  OTHER INSTRUCTIONS  You will need a responsible adult at least 74 years of age to accompany you and drive you home.   This person must remain in the waiting room during your procedure.  Wear loose fitting clothing that is easily removed.  Leave jewelry and other valuables at home.  However, you may wish to bring a book to read or  an iPod/MP3 player to listen to music as you wait for your procedure to start.  Remove all body piercing jewelry and leave at home.  Total time from sign-in until discharge is approximately 2-3 hours.  You should go home directly after your procedure and rest.  You can resume normal activities the  day after your procedure.  The day of your procedure you should not:   Drive   Make legal decisions   Operate machinery   Drink alcohol   Return to work  You will receive specific instructions about eating, activities and medications before you leave.    The above instructions have been reviewed and explained to me by   Ulis Rias RN  December 16, 2009 8:05 AM      I fully understand and  can verbalize these instructions _____________________________ Date _________

## 2010-11-03 NOTE — Procedures (Signed)
Summary: Colonoscopy  Patient: Kim [name] Note: All result statuses are Final unless otherwise noted.  Tests: (1) Colonoscopy (COL)   COL Colonoscopy           DONE     [facility]     [address]     [place], [place]  [address]           COLONOSCOPY PROCEDURE REPORT           PATIENT:  Kim [name], Kim [name]  MR#:  [id number]     BIRTHDATE:  [birth year], 75 yrs. old  GENDER:  female           ENDOSCOPIST:  Judie Petit T. Russella Dar, MD, [facility]           PROCEDURE DATE:  12/24/2009     PROCEDURE:  Colonoscopy with biopsy and snare polypectomy     ASA CLASS:  Class II     INDICATIONS:  1) follow-up of polyp, adenomatous polyp, 02/1999.     Screening.           MEDICATIONS:   Fentanyl 75 mcg IV, Versed 10 mg IV           DESCRIPTION OF PROCEDURE:   After the risks benefits and     alternatives of the procedure were thoroughly explained, informed     consent was obtained.  Digital rectal exam was performed and     revealed no abnormalities.   The LB PCF-H180AL [id number] endoscope     was introduced through the anus and advanced to the cecum, which     was identified by both the appendix and ileocecal valve, without     limitations.  The quality of the prep was good, using MoviPrep.     The instrument was then slowly withdrawn as the colon was fully     examined.     <<PROCEDUREIMAGES>>           FINDINGS:  Three polyps were found in the cecum. They were 6 - 10     mm in size. Polyps were snared, then cauterized with monopolar     cautery. Retrieval was successful.  A sessile polyp was found in     the mid transverse colon. The polyp was removed using cold biopsy     forceps.  A sessile polyp was found in the sigmoid colon. It was 4     mm in size. The polyps were removed using cold biopsy forceps.  A     pedunculated polyp was found in the sigmoid colon. It was 6 mm in     size. Polyp was snared, then cauterized with monopolar cautery.     Retrieval was successful.  Moderate  diverticulosis was found in     the sigmoid colon.  This was otherwise a normal examination of the     colon.  Retroflexed views in the rectum revealed internal     hemorrhoids., small.  The time to cecum =  5.5  minutes. The scope     was then withdrawn (time =  13  min) from the patient and the     procedure completed.           COMPLICATIONS:  None           ENDOSCOPIC IMPRESSION:     1) 6 - 10 mm, three polyps in the cecum     2) Sessile polyp in the mid transverse colon     3) 4 mm sessile  polyp in the sigmoid colon     4) 6 mm pedunculated polyp in the sigmoid colon     5) Moderate diverticulosis in the sigmoid colon     6) Internal hemorrhoids           RECOMMENDATIONS:     1) No aspirin or NSAID's for 2 weeks     2) Await pathology results     3) High fiber diet with liberal fluid intake.     4) Repeat Colonoscopy in 2 years.           Venita Lick. Russella Dar, MD, [facility]           CC: Shellia Carwin, MD           n.     Rosalie DoctorVenita Lick. Stark at 12/24/2009 11:19 AM           Jeri Modena, [id number]  Note: An exclamation mark (!) indicates a result that was not dispersed into the flowsheet. Document Creation Date: 12/24/2009 11:20 AM _______________________________________________________________________  (1) Order result status: Final Collection or observation date-time: 12/24/2009 11:09 Requested date-time:  Receipt date-time:  Reported date-time:  Referring Physician:   Ordering Physician: Claudette Head [id number]) Specimen Source:  Source: Launa Grill Order Number: [id number] Lab site:   Appended Document: Colonoscopy     Procedures Next Due Date:    Colonoscopy: 12/2011

## 2010-11-03 NOTE — Therapy (Signed)
Summary: [facility] Testing/[facility]  Hearing Eval & Testing/[facility]   Imported By: Maryln Gottron 09/03/2010 11:26:04  _____________________________________________________________________  External Attachment:    Type:   Image     Comment:   External Document

## 2010-11-09 ENCOUNTER — Telehealth: Payer: Self-pay | Admitting: *Deleted

## 2010-11-09 NOTE — Telephone Encounter (Signed)
Have her set up an OV soon for an  exam and labs

## 2010-11-09 NOTE — Telephone Encounter (Signed)
Pt wants to know when Dr. Clent Ridges would like her to come in for thyroid check.

## 2010-11-10 NOTE — Telephone Encounter (Signed)
I called Kim [name] and sch her for recheck thyroid lab and a fup ov with Dr Clent Ridges, as noted.

## 2010-11-10 NOTE — Telephone Encounter (Signed)
I lft vm for pt to cb to sch exam and lab as noted.

## 2010-11-11 ENCOUNTER — Other Ambulatory Visit (INDEPENDENT_AMBULATORY_CARE_PROVIDER_SITE_OTHER): Payer: Medicare Other | Admitting: Family Medicine

## 2010-11-11 DIAGNOSIS — E039 Hypothyroidism, unspecified: Secondary | ICD-10-CM

## 2010-11-11 LAB — TSH: TSH: 0.93 u[IU]/mL (ref 0.35–5.50)

## 2010-11-12 ENCOUNTER — Encounter: Payer: Self-pay | Admitting: Family Medicine

## 2010-11-12 ENCOUNTER — Telehealth: Payer: Self-pay

## 2010-11-12 ENCOUNTER — Telehealth: Payer: Self-pay | Admitting: Family Medicine

## 2010-11-12 DIAGNOSIS — E039 Hypothyroidism, unspecified: Secondary | ICD-10-CM

## 2010-11-12 NOTE — Telephone Encounter (Signed)
Message copied by Madison Hickman on Thu Nov 12, 2010  8:21 AM ------      Message from: Dwaine Deter      Created: Wed Nov 11, 2010  5:08 PM       normal

## 2010-11-12 NOTE — Telephone Encounter (Signed)
Pt cx her ov for 11/16/10, since labs were ok. Pt said that Dr Clent Ridges told pt that it would be ok for pt to take generic Lipitor . Pt is req generic Lipitor to [facility] [phone]

## 2010-11-12 NOTE — Telephone Encounter (Signed)
Pt aware of results 

## 2010-11-13 MED ORDER — LEVOTHYROXINE SODIUM 125 MCG PO TABS: 125.0000 ug | ORAL_TABLET | Freq: Every day | ORAL | Status: DC

## 2010-11-13 NOTE — Telephone Encounter (Signed)
I sent in one year supply

## 2010-11-16 ENCOUNTER — Ambulatory Visit: Payer: Self-pay | Admitting: Family Medicine

## 2010-11-17 ENCOUNTER — Telehealth: Payer: Self-pay | Admitting: Family Medicine

## 2010-11-17 NOTE — Telephone Encounter (Signed)
I did this last week already. I don't understand what the problem is

## 2010-11-17 NOTE — Telephone Encounter (Signed)
Patient  stated that Dr Clent Ridges was going to change her rx to generic Levothyroxine . Please send in this new rx to [facility] pharmacy.   The previous phone note was sent in error.

## 2010-11-18 ENCOUNTER — Ambulatory Visit: Payer: Self-pay | Admitting: Family Medicine

## 2010-11-18 NOTE — Telephone Encounter (Signed)
Pt requested generic levothyroxine instead of synthroid due to cost effectiveness. Pharmacy called to verify what they filled, which was the brand synthroid because that is what the pt was taking before. Advised pharm to change to generic. Pt notified

## 2010-12-23 LAB — CBC
HCT: 27.6 % — ABNORMAL LOW (ref 36.0–46.0)
HCT: 29 % — ABNORMAL LOW (ref 36.0–46.0)
HCT: 29.9 % — ABNORMAL LOW (ref 36.0–46.0)
Hemoglobin: 10.2 g/dL — ABNORMAL LOW (ref 12.0–15.0)
Hemoglobin: 9.3 g/dL — ABNORMAL LOW (ref 12.0–15.0)
Hemoglobin: 9.9 g/dL — ABNORMAL LOW (ref 12.0–15.0)
MCHC: 33.5 g/dL (ref 30.0–36.0)
MCHC: 34 g/dL (ref 30.0–36.0)
MCHC: 34.2 g/dL (ref 30.0–36.0)
MCV: 83.3 fL (ref 78.0–100.0)
MCV: 83.3 fL (ref 78.0–100.0)
MCV: 83.7 fL (ref 78.0–100.0)
Platelets: 103 10*3/uL — ABNORMAL LOW (ref 150–400)
Platelets: 105 10*3/uL — ABNORMAL LOW (ref 150–400)
Platelets: 128 10*3/uL — ABNORMAL LOW (ref 150–400)
RBC: 3.3 MIL/uL — ABNORMAL LOW (ref 3.87–5.11)
RBC: 3.49 MIL/uL — ABNORMAL LOW (ref 3.87–5.11)
RBC: 3.59 MIL/uL — ABNORMAL LOW (ref 3.87–5.11)
RDW: 14.9 % (ref 11.5–15.5)
RDW: 15.1 % (ref 11.5–15.5)
RDW: 15.3 % (ref 11.5–15.5)
WBC: 4.1 10*3/uL (ref 4.0–10.5)
WBC: 5 10*3/uL (ref 4.0–10.5)
WBC: 5.4 10*3/uL (ref 4.0–10.5)

## 2010-12-27 LAB — DIFFERENTIAL
Basophils Absolute: 0 10*3/uL (ref 0.0–0.1)
Basophils Relative: 0 % (ref 0–1)
Eosinophils Absolute: 0.2 10*3/uL (ref 0.0–0.7)
Eosinophils Relative: 3 % (ref 0–5)
Lymphocytes Relative: 29 % (ref 12–46)
Lymphs Abs: 1.9 10*3/uL (ref 0.7–4.0)
Monocytes Absolute: 0.7 10*3/uL (ref 0.1–1.0)
Monocytes Relative: 10 % (ref 3–12)
Neutro Abs: 3.8 10*3/uL (ref 1.7–7.7)
Neutrophils Relative %: 58 % (ref 43–77)

## 2010-12-27 LAB — CBC
HCT: 30.1 % — ABNORMAL LOW (ref 36.0–46.0)
HCT: 32.6 % — ABNORMAL LOW (ref 36.0–46.0)
Hemoglobin: 10 g/dL — ABNORMAL LOW (ref 12.0–15.0)
Hemoglobin: 11.1 g/dL — ABNORMAL LOW (ref 12.0–15.0)
MCHC: 33.1 g/dL (ref 30.0–36.0)
MCHC: 33.9 g/dL (ref 30.0–36.0)
MCV: 82.3 fL (ref 78.0–100.0)
MCV: 83.7 fL (ref 78.0–100.0)
Platelets: 107 10*3/uL — ABNORMAL LOW (ref 150–400)
Platelets: 140 10*3/uL — ABNORMAL LOW (ref 150–400)
RBC: 3.59 MIL/uL — ABNORMAL LOW (ref 3.87–5.11)
RBC: 3.96 MIL/uL (ref 3.87–5.11)
RDW: 14.9 % (ref 11.5–15.5)
RDW: 15 % (ref 11.5–15.5)
WBC: 4.2 10*3/uL (ref 4.0–10.5)
WBC: 6.7 10*3/uL (ref 4.0–10.5)

## 2010-12-27 LAB — HEMOGLOBIN AND HEMATOCRIT, BLOOD
HCT: 28.1 % — ABNORMAL LOW (ref 36.0–46.0)
Hemoglobin: 9.4 g/dL — ABNORMAL LOW (ref 12.0–15.0)

## 2010-12-27 LAB — PROTIME-INR
INR: 1.05 (ref 0.00–1.49)
Prothrombin Time: 13.6 seconds (ref 11.6–15.2)

## 2010-12-27 LAB — CROSSMATCH
ABO/RH(D): A POS
Antibody Screen: NEGATIVE

## 2010-12-27 LAB — BASIC METABOLIC PANEL
BUN: 21 mg/dL (ref 6–23)
BUN: 22 mg/dL (ref 6–23)
CO2: 24 mEq/L (ref 19–32)
CO2: 26 mEq/L (ref 19–32)
Calcium: 8.9 mg/dL (ref 8.4–10.5)
Calcium: 9.1 mg/dL (ref 8.4–10.5)
Chloride: 101 mEq/L (ref 96–112)
Chloride: 108 mEq/L (ref 96–112)
Creatinine, Ser: 1.06 mg/dL (ref 0.4–1.2)
Creatinine, Ser: 1.33 mg/dL — ABNORMAL HIGH (ref 0.4–1.2)
GFR calc Af Amer: 47 mL/min — ABNORMAL LOW (ref >60)
GFR calc Af Amer: >60 mL/min (ref >60)
GFR calc non Af Amer: 39 mL/min — ABNORMAL LOW (ref >60)
GFR calc non Af Amer: 51 mL/min — ABNORMAL LOW (ref >60)
Glucose, Bld: 105 mg/dL — ABNORMAL HIGH (ref 70–99)
Glucose, Bld: 114 mg/dL — ABNORMAL HIGH (ref 70–99)
Potassium: 3.6 mEq/L (ref 3.5–5.1)
Potassium: 4.1 mEq/L (ref 3.5–5.1)
Sodium: 133 mEq/L — ABNORMAL LOW (ref 135–145)
Sodium: 140 mEq/L (ref 135–145)

## 2010-12-27 LAB — ABO/RH: ABO/RH(D): A POS

## 2010-12-27 LAB — APTT: aPTT: 30 seconds (ref 24–37)

## 2010-12-27 LAB — MRSA PCR SCREENING: MRSA by PCR: NEGATIVE

## 2010-12-27 LAB — PREPARE RBC (CROSSMATCH)

## 2011-02-15 ENCOUNTER — Other Ambulatory Visit: Payer: Self-pay | Admitting: Family Medicine

## 2011-02-15 DIAGNOSIS — Z1231 Encounter for screening mammogram for malignant neoplasm of breast: Secondary | ICD-10-CM

## 2011-02-19 ENCOUNTER — Ambulatory Visit
Admission: RE | Admit: 2011-02-19 | Discharge: 2011-02-19 | Disposition: A | Discharge status: Home / Self Care | Payer: Medicare Other | Source: Ambulatory Visit | Attending: Family Medicine | Admitting: Family Medicine

## 2011-02-19 DIAGNOSIS — Z1231 Encounter for screening mammogram for malignant neoplasm of breast: Secondary | ICD-10-CM

## 2011-02-19 NOTE — Op Note (Signed)
NAMEMATA, ROWEN NO.:  [id number]   MEDICAL RECORD NO.:  [id number]          PATIENT TYPE:  AMB   LOCATION:  DSC                          FACILITY:  [facility]   PHYSICIAN:  Sharolyn Douglas, M.D.        DATE OF BIRTH:  11-15-1934   DATE OF PROCEDURE:  11/22/2006  DATE OF DISCHARGE:                               OPERATIVE REPORT   DIAGNOSIS:  Lumbar spinal stenosis.   PROCEDURE:  1. Lumbar epidural steroid injection.  2. Fluoroscopic imaging used for needle placement of the above      injection.   SURGEON:  Sharolyn Douglas, MD   ASSISTANT:  None.   ANESTHESIA:  MAC plus local.   COMPLICATIONS:  None.   INDICATIONS:  The patient is a pleasant 76 year old female with lumbar  spinal stenosis.  She has failed other treatment options and now  presents for epidural steroid injection.  Risk, benefits, and  alternatives reviewed.   PROCEDURE:  After informed consent, taken to the operating room.  She  was turned prone.  Back prepped, draped usual sterile fashion.  She  underwent sedation by anesthesia.  A 17-gauge Tuohy spinal needle was  then advanced using AP and lateral fluoroscopy into the L5-S1  interspace.  Using the loss-of-resistance technique, the needle tip was  placed in the epidural space.  Aspiration showed no blood or no CSF.  Omnipaque was injected under live fluoroscopy 1 mL showing epidural  spread mostly distally.  We then injected a solution of 80 mg Depo-  Medrol and 4 mL 1% preservative-free lidocaine.  The patient tolerated  procedure well.  No apparent complications, transferred to recovery  stable condition, neurologically intact.  I reviewed post injection  instructions with her, follow-up in 3 weeks.      Sharolyn Douglas, M.D.  Electronically Signed     MC/MEDQ  D:  11/22/2006  T:  11/22/2006  Job:  [id number]

## 2011-05-11 ENCOUNTER — Encounter: Payer: Self-pay | Admitting: Family Medicine

## 2011-05-11 ENCOUNTER — Ambulatory Visit (INDEPENDENT_AMBULATORY_CARE_PROVIDER_SITE_OTHER): Payer: Medicare Other | Admitting: Family Medicine

## 2011-05-11 VITALS — BP 124/78 | HR 87 | Temp 98.9°F | Wt 176.0 lb

## 2011-05-11 DIAGNOSIS — E039 Hypothyroidism, unspecified: Secondary | ICD-10-CM

## 2011-05-11 DIAGNOSIS — R5381 Other malaise: Secondary | ICD-10-CM

## 2011-05-11 DIAGNOSIS — R5383 Other fatigue: Secondary | ICD-10-CM

## 2011-05-11 NOTE — Progress Notes (Signed)
  Subjective:    Patient ID: Kim [name], female    DOB: 08-May-1935, 76 y.o.   MRN: [id number]  HPI Here to ask about her BP and to discuss chronic fatigue. I have not seen her in almost 2 years, because she has been busy taking care of her husband, who has multiple medical issues. She is busy every day running him form one appointment to another. She does sleep well. She started checking her BP a week ago, and she gets readings of 120-142/70-88.    Review of Systems  Constitutional: Positive for fatigue. Negative for activity change, appetite change and unexpected weight change.  Respiratory: Negative.   Cardiovascular: Negative.   Gastrointestinal: Negative.   Psychiatric/Behavioral: Negative for dysphoric mood and decreased concentration. The patient is nervous/anxious.        Objective:   Physical Exam  Constitutional: She is oriented to person, place, and time. She appears well-developed and well-nourished.  Neck: No thyromegaly present.  Cardiovascular: Normal rate, regular rhythm, normal heart sounds and intact distal pulses.   Pulmonary/Chest: Effort normal and breath sounds normal.  Lymphadenopathy:    She has no cervical adenopathy.  Neurological: She is alert and oriented to person, place, and time.  Psychiatric: She has a normal mood and affect. Her behavior is normal. Thought content normal.          Assessment & Plan:  I think her fatigue is simply the effects of stress on her body. I reassured her that she does not have HTN. We will check labs today.

## 2011-05-12 LAB — BASIC METABOLIC PANEL
BUN: 23 mg/dL (ref 6–23)
CO2: 26 mEq/L (ref 19–32)
Calcium: 9.4 mg/dL (ref 8.4–10.5)
Chloride: 103 mEq/L (ref 96–112)
Creatinine, Ser: 1.2 mg/dL (ref 0.4–1.2)
GFR: 45.31 mL/min — ABNORMAL LOW (ref >60.00)
Glucose, Bld: 88 mg/dL (ref 70–99)
Potassium: 3.8 mEq/L (ref 3.5–5.1)
Sodium: 139 mEq/L (ref 135–145)

## 2011-05-12 LAB — POCT URINALYSIS DIPSTICK
Bilirubin, UA: NEGATIVE
Blood, UA: NEGATIVE
Glucose, UA: NEGATIVE
Ketones, UA: NEGATIVE
Nitrite, UA: NEGATIVE
Protein, UA: NEGATIVE
Spec Grav, UA: 1.015
Urobilinogen, UA: 0.2
pH, UA: 5.5

## 2011-05-12 LAB — CBC WITH DIFFERENTIAL/PLATELET
Basophils Absolute: 0 10*3/uL (ref 0.0–0.1)
Basophils Relative: 0.2 % (ref 0.0–3.0)
Eosinophils Absolute: 0.2 10*3/uL (ref 0.0–0.7)
Eosinophils Relative: 2.8 % (ref 0.0–5.0)
HCT: 40 % (ref 36.0–46.0)
Hemoglobin: 13.6 g/dL (ref 12.0–15.0)
Lymphocytes Relative: 25.7 % (ref 12.0–46.0)
Lymphs Abs: 1.6 10*3/uL (ref 0.7–4.0)
MCHC: 34 g/dL (ref 30.0–36.0)
MCV: 93.1 fl (ref 78.0–100.0)
Monocytes Absolute: 0.6 10*3/uL (ref 0.1–1.0)
Monocytes Relative: 9.6 % (ref 3.0–12.0)
Neutro Abs: 3.9 10*3/uL (ref 1.4–7.7)
Neutrophils Relative %: 61.7 % (ref 43.0–77.0)
Platelets: 137 10*3/uL — ABNORMAL LOW (ref 150.0–400.0)
RBC: 4.3 Mil/uL (ref 3.87–5.11)
RDW: 13.5 % (ref 11.5–14.6)
WBC: 6.4 10*3/uL (ref 4.5–10.5)

## 2011-05-12 LAB — HEPATIC FUNCTION PANEL
ALT: 11 U/L (ref 0–35)
AST: 16 U/L (ref 0–37)
Albumin: 4.4 g/dL (ref 3.5–5.2)
Alkaline Phosphatase: 53 U/L (ref 39–117)
Bilirubin, Direct: 0.1 mg/dL (ref 0.0–0.3)
Total Bilirubin: 1.5 mg/dL — ABNORMAL HIGH (ref 0.3–1.2)
Total Protein: 7.3 g/dL (ref 6.0–8.3)

## 2011-05-12 LAB — VITAMIN B12: Vitamin B-12: >1500 pg/mL — ABNORMAL HIGH (ref 211–911)

## 2011-05-12 LAB — TSH: TSH: 2.26 u[IU]/mL (ref 0.35–5.50)

## 2011-05-14 ENCOUNTER — Telehealth: Payer: Self-pay | Admitting: *Deleted

## 2011-05-14 NOTE — Telephone Encounter (Signed)
Would like to have lab results ASAP.

## 2011-05-14 NOTE — Telephone Encounter (Signed)
They were normal

## 2011-05-17 ENCOUNTER — Telehealth: Payer: Self-pay | Admitting: *Deleted

## 2011-05-17 NOTE — Progress Notes (Signed)
Spoke with pt and gave results. 

## 2011-05-17 NOTE — Telephone Encounter (Signed)
Pt aware of lab results 

## 2011-05-17 NOTE — Telephone Encounter (Signed)
Pt is calling back for test results ASAP.

## 2011-05-17 NOTE — Telephone Encounter (Signed)
Pt is asking to be called about lab results TODAY, please.

## 2011-05-18 ENCOUNTER — Ambulatory Visit: Payer: Medicare Other | Admitting: Internal Medicine

## 2011-10-27 ENCOUNTER — Telehealth: Payer: Self-pay | Admitting: Family Medicine

## 2011-10-27 NOTE — Telephone Encounter (Signed)
Need another referral to have a hearing aid test. Please send to Dr Landry Mellow ---ph---[phone]. Thanks.

## 2011-10-28 NOTE — Telephone Encounter (Signed)
Left voice message.

## 2011-10-28 NOTE — Telephone Encounter (Signed)
Tell her that I did the referral, and [name] will call her

## 2011-10-28 NOTE — Telephone Encounter (Signed)
Kim [name], I just wanted to make sure that you have the name and number that the pt is requesting.

## 2011-12-02 ENCOUNTER — Encounter: Payer: Self-pay | Admitting: Gastroenterology

## 2011-12-06 ENCOUNTER — Other Ambulatory Visit: Payer: Self-pay | Admitting: Family Medicine

## 2011-12-08 ENCOUNTER — Encounter: Payer: Self-pay | Admitting: Gastroenterology

## 2011-12-20 ENCOUNTER — Encounter: Payer: Self-pay | Admitting: Family Medicine

## 2011-12-21 ENCOUNTER — Ambulatory Visit (AMBULATORY_SURGERY_CENTER): Payer: Medicare Other | Admitting: *Deleted

## 2011-12-21 VITALS — Ht 68.0 in | Wt 180.0 lb

## 2011-12-21 DIAGNOSIS — Z1211 Encounter for screening for malignant neoplasm of colon: Secondary | ICD-10-CM

## 2011-12-21 MED ORDER — PEG-KCL-NACL-NASULF-NA ASC-C 100 G PO SOLR: ORAL | Status: DC

## 2012-01-01 ENCOUNTER — Other Ambulatory Visit: Payer: Self-pay | Admitting: Family Medicine

## 2012-01-04 ENCOUNTER — Encounter: Payer: Self-pay | Admitting: Gastroenterology

## 2012-01-04 ENCOUNTER — Ambulatory Visit (AMBULATORY_SURGERY_CENTER): Payer: Medicare Other | Admitting: Gastroenterology

## 2012-01-04 ENCOUNTER — Encounter: Payer: Medicare Other | Admitting: Gastroenterology

## 2012-01-04 VITALS — BP 138/79 | HR 75 | Temp 97.9°F | Resp 21 | Ht 68.0 in | Wt 180.0 lb

## 2012-01-04 DIAGNOSIS — Z860100 Personal history of colon polyps, unspecified: Secondary | ICD-10-CM

## 2012-01-04 DIAGNOSIS — D126 Benign neoplasm of colon, unspecified: Secondary | ICD-10-CM

## 2012-01-04 DIAGNOSIS — Z1211 Encounter for screening for malignant neoplasm of colon: Secondary | ICD-10-CM

## 2012-01-04 MED ORDER — SODIUM CHLORIDE 0.9 % IV SOLN: 500.0000 mL | INTRAVENOUS | Status: DC

## 2012-01-04 NOTE — Patient Instructions (Signed)
Hand outs given on polyps, diverticulosis and high fiber diet with liberal fluid intake.  Hold aspirin,aspirin products, and anti-inflammatory medications for two weeks until 01-18-12.  Call if any questions or concerns.   YOU HAD AN ENDOSCOPIC PROCEDURE TODAY AT THE [facility]: Refer to the procedure report that was given to you for any specific questions about what was found during the examination.  If the procedure report does not answer your questions, please call your gastroenterologist to clarify.  If you requested that your care partner not be given the details of your procedure findings, then the procedure report has been included in a sealed envelope for you to review at your convenience later.  YOU SHOULD EXPECT: Some feelings of bloating in the abdomen. Passage of more gas than usual.  Walking can help get rid of the air that was put into your GI tract during the procedure and reduce the bloating. If you had a lower endoscopy (such as a colonoscopy or flexible sigmoidoscopy) you may notice spotting of blood in your stool or on the toilet paper. If you underwent a bowel prep for your procedure, then you may not have a normal bowel movement for a few days.  DIET: Your first meal following the procedure should be a light meal and then it is ok to progress to your normal diet.  A half-sandwich or bowl of soup is an example of a good first meal.  Heavy or fried foods are harder to digest and may make you feel nauseous or bloated.  Likewise meals heavy in dairy and vegetables can cause extra gas to form and this can also increase the bloating.  Drink plenty of fluids but you should avoid alcoholic beverages for 24 hours.  ACTIVITY: Your care partner should take you home directly after the procedure.  You should plan to take it easy, moving slowly for the rest of the day.  You can resume normal activity the day after the procedure however you should NOT DRIVE or use heavy machinery for 24  hours (because of the sedation medicines used during the test).    SYMPTOMS TO REPORT IMMEDIATELY: A gastroenterologist can be reached at any hour.  During normal business hours, 8:30 AM to 5:00 PM Monday through Friday, call [phone].  After hours and on weekends, please call the GI answering service at [phone] who will take a message and have the physician on call contact you.   Following lower endoscopy (colonoscopy or flexible sigmoidoscopy):  Excessive amounts of blood in the stool  Significant tenderness or worsening of abdominal pains  Swelling of the abdomen that is new, acute  Fever of 100F or higher    FOLLOW UP: If any biopsies were taken you will be contacted by phone or by letter within the next 1-3 weeks.  Call your gastroenterologist if you have not heard about the biopsies in 3 weeks.  Our staff will call the home number listed on your records the next business day following your procedure to check on you and address any questions or concerns that you may have at that time regarding the information given to you following your procedure. This is a courtesy call and so if there is no answer at the home number and we have not heard from you through the emergency physician on call, we will assume that you have returned to your regular daily activities without incident.  SIGNATURES/CONFIDENTIALITY: You and/or your care partner have signed paperwork which will be  entered into your electronic medical record.  These signatures attest to the fact that that the information above on your After Visit Summary has been reviewed and is understood.  Full responsibility of the confidentiality of this discharge information lies with you and/or your care-partner.

## 2012-01-04 NOTE — Progress Notes (Signed)
No complaints noted in the recovery room. Maw  Patient did not experience any of the following events: a burn prior to discharge; a fall within the facility; wrong site/side/patient/procedure/implant event; or a hospital transfer or hospital admission upon discharge from the facility. (G8907) Patient did not have preoperative order for IV antibiotic SSI prophylaxis. (G8918)  

## 2012-01-04 NOTE — Op Note (Signed)
[facility] [address] [place], [place]  [address]  COLONOSCOPY PROCEDURE REPORT  PATIENT:  Imonie, Tuch  MR#:  [id number] BIRTHDATE:  04/17/1935, 77 yrs. old  GENDER:  female ENDOSCOPIST:  Judie Petit T. Russella Dar, MD, [facility]  PROCEDURE DATE:  01/04/2012 PROCEDURE:  Colonoscopy with biopsy and snare polypectomy ASA CLASS:  Class II INDICATIONS:  1) surveillance and high-risk screening  2) history of pre-cancerous (adenomatous) colon polyps-2000, TVA-2011 MEDICATIONS:   MAC sedation, administered by CRNA, propofol (Diprivan) 400 mg IV DESCRIPTION OF PROCEDURE:   After the risks benefits and alternatives of the procedure were thoroughly explained, informed consent was obtained.  Digital rectal exam was performed and revealed no abnormalities.   The LB 180AL [id number] endoscope was introduced through the anus and advanced to the cecum, which was identified by both the appendix and ileocecal valve, with a tortuous sigmoid colon and sigmoid spasm.  The quality of the prep was good, using MoviPrep.  The instrument was then slowly withdrawn as the colon was fully examined. <<PROCEDUREIMAGES>> FINDINGS:  A sessile polyp was found in the cecum. It was 8 mm in size. Polyp was snared without cautery. Retrieval was successful. A sessile polyp was found in the ascending colon. It was 6 mm in size. Polyp was snared without cautery. Retrieval was successful. A sessile polyp was found in the proximal transverse colon. It was 4 mm in size. The polyp was removed using cold biopsy forceps.  A sessile polyp was found at the splenic flexure. It was 4 mm in size. The polyp was removed using cold biopsy forceps.  A sessile polyp was found in the sigmoid colon. It was 4 mm in size. The polyp was removed using cold biopsy forceps.  A sessile polyp was found in the sigmoid colon. It was 5 mm in size. Polyp was snared without cautery. Retrieval was successful. Moderate diverticulosis was found in the  sigmoid to descending colon.  Otherwise normal colonoscopy without other polyps, masses, vascular ectasias, or inflammatory changes.  Retroflexed views in the rectum revealed no abnormalities.   The time to cecum =  11  minutes. The scope was then withdrawn (time =  14  min) from the patient and the procedure completed.  COMPLICATIONS:  None  ENDOSCOPIC IMPRESSION: 1) 8 mm sessile polyp in the cecum 2) 6 mm sessile polyp in the ascending colon 3) 4 mm sessile polyp in the proximal transverse colon 4) 4 mm sessile polyp at the splenic flexure 5) 4 mm sessile polyp in the sigmoid colon 6) 5 mm sessile polyp in the sigmoid colon 7) Moderate diverticulosis in the sigmoid and descending colon  RECOMMENDATIONS: 1) Hold aspirin, aspirin products, and anti-inflammatory medication for 2 weeks. 2) Await pathology results 3) High fiber diet with liberal fluid intake. 4) Repeat Colonoscopy in 3 years.  Venita Lick. Russella Dar, MD, [facility]  n. eSIGNED:   Venita Lick. Nevae Pinnix at 01/04/2012 12:38 PM  Jeri Modena, [id number]

## 2012-01-05 ENCOUNTER — Telehealth: Payer: Self-pay | Admitting: *Deleted

## 2012-01-05 NOTE — Telephone Encounter (Signed)
  Follow up Call-  Call back number 01/04/2012  Post procedure Call Back phone  # [phone]  Permission to leave phone message Yes     Patient questions:  Do you have a fever, pain , or abdominal swelling? no Pain Score  0 *  Have you tolerated food without any problems? yes  Have you been able to return to your normal activities? yes  Do you have any questions about your discharge instructions: Diet   no Medications  no Follow up visit  no  Do you have questions or concerns about your Care? no  Actions: * If pain score is 4 or above: No action needed, pain <4.

## 2012-01-10 ENCOUNTER — Encounter: Payer: Self-pay | Admitting: Gastroenterology

## 2012-01-19 ENCOUNTER — Other Ambulatory Visit: Payer: Self-pay | Admitting: Family Medicine

## 2012-01-19 DIAGNOSIS — Z1231 Encounter for screening mammogram for malignant neoplasm of breast: Secondary | ICD-10-CM

## 2012-03-27 ENCOUNTER — Ambulatory Visit
Admission: RE | Admit: 2012-03-27 | Discharge: 2012-03-27 | Disposition: A | Discharge status: Home / Self Care | Payer: Medicare Other | Source: Ambulatory Visit | Attending: Family Medicine | Admitting: Family Medicine

## 2012-03-27 DIAGNOSIS — Z1231 Encounter for screening mammogram for malignant neoplasm of breast: Secondary | ICD-10-CM

## 2012-04-05 ENCOUNTER — Ambulatory Visit (INDEPENDENT_AMBULATORY_CARE_PROVIDER_SITE_OTHER): Payer: Medicare Other | Admitting: Internal Medicine

## 2012-04-05 ENCOUNTER — Encounter: Payer: Self-pay | Admitting: Internal Medicine

## 2012-04-05 VITALS — BP 144/82 | HR 92 | Temp 98.0°F | Wt 178.0 lb

## 2012-04-05 DIAGNOSIS — I10 Essential (primary) hypertension: Secondary | ICD-10-CM

## 2012-04-05 DIAGNOSIS — R011 Cardiac murmur, unspecified: Secondary | ICD-10-CM

## 2012-04-05 MED ORDER — BENAZEPRIL HCL 10 MG PO TABS: 10.0000 mg | ORAL_TABLET | Freq: Every day | ORAL | Status: DC

## 2012-04-05 NOTE — Patient Instructions (Signed)
Please complete the following lab tests before your next follow up appointment: BMET - 401.9 Our office will contact you re: scheduling 2-D echocardiogram

## 2012-04-05 NOTE — Assessment & Plan Note (Signed)
Patient has cardiac murmur suggestive of aortic stenosis. Obtain 2-D echocardiogram.

## 2012-04-05 NOTE — Progress Notes (Signed)
Subjective:    Patient ID: Kim [name], female    DOB: 1935/01/22, 77 y.o.   MRN: [id number]  HPI  77 year old white female with history of hypothyroidism presents with elevated blood pressure readings. She was recently seen by her orthopedic specialist who noted elevated blood pressure readings. At home she notes one reading of 190/100. She denies any associated headache or chest pain.  She denies use of any over-the-counter agents that would raise her blood pressure.  Review of Systems Negative for chest pain or shortness of breath  Past Medical History  Diagnosis Date  . Allergy   . Allergic rhinitis   . Hypothyroidism   . Urinary tract infection   . Diverticulosis   . Hx of adenomatous colonic polyps 02/1999  . Tubulovillous adenoma of colon 12/2009    with HGD  . Low back pain     gets ESI from Dr. Sharolyn Douglas  . Arthritis     History   Social History  . Marital Status: Married    Spouse Name: N/A    Number of Children: N/A  . Years of Education: N/A   Occupational History  . Not on file.   Social History Main Topics  . Smoking status: Never Smoker   . Smokeless tobacco: Never Used  . Alcohol Use: No  . Drug Use: No  . Sexually Active: Not on file   Other Topics Concern  . Not on file   Social History Narrative  . No narrative on file    Past Surgical History  Procedure Date  . Abdominal hysterectomy   . Bilateral salpingoophorectomy     see Debbora Dus NP for GYN exams  . Cataract extraction w/ intraocular lens  implant, bilateral 2012    Family History  Problem Relation Age of Onset  . Alcohol abuse    . Kidney failure    . Colon polyps Daughter   . Colon cancer      family    Allergies  Allergen Reactions  . Cortisone Rash  . Penicillins     REACTION: unspecified  . Propoxyphene-Acetaminophen     REACTION: tongue swelling  . Pseudoephedrine Hcl Er Rash and Other (See Comments)    Face gets red    Current Outpatient Prescriptions on  File Prior to Visit  Medication Sig Dispense Refill  . acetaminophen (TYLENOL) 650 MG CR tablet Take 650 mg by mouth every 8 (eight) hours as needed.      . Calcium Citrate-Vitamin D (CITRACAL MAXIMUM PO) Take by mouth.        . Cyanocobalamin (VITAMIN B 12 PO) Take 2,500 each by mouth daily.        Marland Kitchen levothyroxine (SYNTHROID, LEVOTHROID) 125 MCG tablet TAKE 1 TABLET BY MOUTH ONCE A DAY  30 tablet  3  . Multiple Vitamin (MULTIVITAMIN) tablet Take 1 tablet by mouth daily.        . benazepril (LOTENSIN) 10 MG tablet Take 1 tablet (10 mg total) by mouth daily.  30 tablet  3  . DISCONTD: ferrous sulfate 325 (65 FE) MG tablet Take 325 mg by mouth 2 (two) times daily.          BP 144/82  Pulse 92  Temp 98 F (36.7 C) (Oral)  Wt 178 lb (80.74 kg)       Objective:   Physical Exam  Constitutional: She is oriented to person, place, and time. She appears well-developed and well-nourished.  Cardiovascular: Normal rate and regular rhythm.  Murmur heard.      Systolic ejection murmur 2/6 loudest at right sternal border  Pulmonary/Chest: Effort normal and breath sounds normal. She has no wheezes.  Neurological: She is alert and oriented to person, place, and time.  Skin: Skin is warm and dry.  Psychiatric: She has a normal mood and affect. Her behavior is normal.          Assessment & Plan:

## 2012-04-05 NOTE — Assessment & Plan Note (Addendum)
77 year old white female with stage I hypertension. Start benazepril 10 mg once a day. She declined use of diuretics.  She already has difficulty with urinary frequency.  Reassess in 4-6 weeks.  BMET before next OV.  BP: 144/82 mmHg  Lab Results  Component Value Date   CREATININE 1.2 05/11/2011

## 2012-04-11 ENCOUNTER — Other Ambulatory Visit (HOSPITAL_COMMUNITY): Payer: Medicare Other

## 2012-04-28 ENCOUNTER — Other Ambulatory Visit (INDEPENDENT_AMBULATORY_CARE_PROVIDER_SITE_OTHER): Payer: Medicare Other

## 2012-04-28 DIAGNOSIS — I10 Essential (primary) hypertension: Secondary | ICD-10-CM

## 2012-04-28 LAB — BASIC METABOLIC PANEL
BUN: 23 mg/dL (ref 6–23)
CO2: 26 mEq/L (ref 19–32)
Calcium: 9.3 mg/dL (ref 8.4–10.5)
Chloride: 101 mEq/L (ref 96–112)
Creatinine, Ser: 1 mg/dL (ref 0.4–1.2)
GFR: 54.84 mL/min — ABNORMAL LOW (ref >60.00)
Glucose, Bld: 95 mg/dL (ref 70–99)
Potassium: 4.1 mEq/L (ref 3.5–5.1)
Sodium: 137 mEq/L (ref 135–145)

## 2012-05-01 ENCOUNTER — Other Ambulatory Visit: Payer: Medicare Other

## 2012-05-05 NOTE — Progress Notes (Signed)
Quick Note:  I left voice message with normal results. ______ 

## 2012-05-08 ENCOUNTER — Ambulatory Visit (INDEPENDENT_AMBULATORY_CARE_PROVIDER_SITE_OTHER): Payer: Medicare Other | Admitting: Family Medicine

## 2012-05-08 ENCOUNTER — Encounter: Payer: Self-pay | Admitting: Family Medicine

## 2012-05-08 VITALS — BP 124/80 | HR 87 | Temp 98.5°F | Wt 176.0 lb

## 2012-05-08 DIAGNOSIS — I10 Essential (primary) hypertension: Secondary | ICD-10-CM

## 2012-05-08 DIAGNOSIS — R011 Cardiac murmur, unspecified: Secondary | ICD-10-CM

## 2012-05-08 NOTE — Progress Notes (Signed)
  Subjective:    Patient ID: Kim [name], female    DOB: 03/05/1935, 77 y.o.   MRN: [id number]  HPI Here to follow up recently diagnosed HTN. She was seen by Dr. Artist Pais one month ago and was started on Benazepril. She has tolerated this well, and her BP at home has been well controlled. Dr. Artist Pais wanted to set up an ECHO to assess her mumur but she cancelled this to ask my opinion. She had one in 2007 which showed some anterior motion of her mitral valve only.    Review of Systems  Constitutional: Negative.   Respiratory: Negative.   Cardiovascular: Negative.        Objective:   Physical Exam  Constitutional: She appears well-developed and well-nourished.  Neck: No thyromegaly present.  Cardiovascular: Normal rate and regular rhythm.  Exam reveals no gallop and no friction rub.        Harsh systolic murmur loudest at the mitral area 3/6   Pulmonary/Chest: Effort normal and breath sounds normal.  Lymphadenopathy:    She has no cervical adenopathy.          Assessment & Plan:  Her HTN is stable. Her mumur has progressed over time so we will get another ECHO to assess

## 2012-05-11 ENCOUNTER — Ambulatory Visit (HOSPITAL_COMMUNITY): Payer: Medicare Other | Attending: Family Medicine | Admitting: Radiology

## 2012-05-11 DIAGNOSIS — I517 Cardiomegaly: Secondary | ICD-10-CM | POA: Insufficient documentation

## 2012-05-11 DIAGNOSIS — I059 Rheumatic mitral valve disease, unspecified: Secondary | ICD-10-CM | POA: Insufficient documentation

## 2012-05-11 DIAGNOSIS — E039 Hypothyroidism, unspecified: Secondary | ICD-10-CM | POA: Insufficient documentation

## 2012-05-11 DIAGNOSIS — R011 Cardiac murmur, unspecified: Secondary | ICD-10-CM

## 2012-05-11 DIAGNOSIS — I10 Essential (primary) hypertension: Secondary | ICD-10-CM | POA: Insufficient documentation

## 2012-05-11 NOTE — Progress Notes (Signed)
Echocardiogram performed.  

## 2012-05-12 NOTE — Addendum Note (Signed)
Addended by: Gershon Crane A on: 05/12/2012 01:05 PM   Modules accepted: Orders

## 2012-05-12 NOTE — Progress Notes (Signed)
Quick Note:  Attempt to call both hm and cell - VM - left msg I will call back on Monday to discuss results ______

## 2012-05-15 NOTE — Progress Notes (Signed)
Quick Note:  Spoke with pt- informed of results and the referral that dr. Clent Ridges has done. ______

## 2012-05-18 ENCOUNTER — Ambulatory Visit (INDEPENDENT_AMBULATORY_CARE_PROVIDER_SITE_OTHER): Payer: Medicare Other | Admitting: Family Medicine

## 2012-05-18 ENCOUNTER — Encounter: Payer: Self-pay | Admitting: Family Medicine

## 2012-05-18 VITALS — BP 106/70 | Temp 98.1°F | Wt 176.0 lb

## 2012-05-18 DIAGNOSIS — L259 Unspecified contact dermatitis, unspecified cause: Secondary | ICD-10-CM

## 2012-05-18 DIAGNOSIS — I10 Essential (primary) hypertension: Secondary | ICD-10-CM

## 2012-05-18 LAB — BASIC METABOLIC PANEL
BUN: 19 mg/dL (ref 6–23)
CO2: 29 mEq/L (ref 19–32)
Calcium: 9.6 mg/dL (ref 8.4–10.5)
Chloride: 102 mEq/L (ref 96–112)
Creatinine, Ser: 0.9 mg/dL (ref 0.4–1.2)
GFR: 64.79 mL/min (ref >60.00)
Glucose, Bld: 90 mg/dL (ref 70–99)
Potassium: 4.3 mEq/L (ref 3.5–5.1)
Sodium: 138 mEq/L (ref 135–145)

## 2012-05-18 LAB — CBC WITH DIFFERENTIAL/PLATELET
Basophils Absolute: 0 10*3/uL (ref 0.0–0.1)
Basophils Relative: 0.4 % (ref 0.0–3.0)
Eosinophils Absolute: 0.2 10*3/uL (ref 0.0–0.7)
Eosinophils Relative: 4.2 % (ref 0.0–5.0)
HCT: 41.1 % (ref 36.0–46.0)
Hemoglobin: 13.8 g/dL (ref 12.0–15.0)
Lymphocytes Relative: 25 % (ref 12.0–46.0)
Lymphs Abs: 1.2 10*3/uL (ref 0.7–4.0)
MCHC: 33.6 g/dL (ref 30.0–36.0)
MCV: 91.7 fl (ref 78.0–100.0)
Monocytes Absolute: 0.5 10*3/uL (ref 0.1–1.0)
Monocytes Relative: 9.2 % (ref 3.0–12.0)
Neutro Abs: 3 10*3/uL (ref 1.4–7.7)
Neutrophils Relative %: 61.2 % (ref 43.0–77.0)
Platelets: 124 10*3/uL — ABNORMAL LOW (ref 150.0–400.0)
RBC: 4.48 Mil/uL (ref 3.87–5.11)
RDW: 13.7 % (ref 11.5–14.6)
WBC: 5 10*3/uL (ref 4.5–10.5)

## 2012-05-18 LAB — HEPATIC FUNCTION PANEL
ALT: 11 U/L (ref 0–35)
AST: 11 U/L (ref 0–37)
Albumin: 4.4 g/dL (ref 3.5–5.2)
Alkaline Phosphatase: 68 U/L (ref 39–117)
Bilirubin, Direct: 0.1 mg/dL (ref 0.0–0.3)
Total Bilirubin: 0.8 mg/dL (ref 0.3–1.2)
Total Protein: 7.2 g/dL (ref 6.0–8.3)

## 2012-05-18 LAB — POCT URINALYSIS DIPSTICK
Bilirubin, UA: NEGATIVE
Glucose, UA: NEGATIVE
Ketones, UA: NEGATIVE
Nitrite, UA: POSITIVE
Protein, UA: NEGATIVE
Spec Grav, UA: 1.015
Urobilinogen, UA: 0.2
pH, UA: 5.5

## 2012-05-18 LAB — TSH: TSH: 0.51 u[IU]/mL (ref 0.35–5.50)

## 2012-05-18 LAB — LIPID PANEL
Cholesterol: 218 mg/dL — ABNORMAL HIGH (ref 0–200)
HDL: 45.1 mg/dL (ref >39.00)
Total CHOL/HDL Ratio: 5
Triglycerides: 147 mg/dL (ref 0.0–149.0)
VLDL: 29.4 mg/dL (ref 0.0–40.0)

## 2012-05-18 LAB — LDL CHOLESTEROL, DIRECT: Direct LDL: 147.6 mg/dL

## 2012-05-18 MED ORDER — METHYLPREDNISOLONE ACETATE 80 MG/ML IJ SUSP
80.0000 mg | Freq: Once | INTRAMUSCULAR | Status: AC
  Administered 2012-05-18: 80 mg via INTRAMUSCULAR

## 2012-05-18 NOTE — Addendum Note (Signed)
Addended by: Duard Brady I on: 05/18/2012 09:23 AM   Modules accepted: Orders

## 2012-05-18 NOTE — Progress Notes (Signed)
  Subjective:    Patient ID: Kim [name], female    DOB: Dec 11, 1934, 77 y.o.   MRN: [id number]  HPI Here for an itchy rash over both arms. This started a few days ago after she was trimming some trees in her yard. Using topical cortisone creams.    Review of Systems  Constitutional: Negative.   Skin: Positive for rash.       Objective:   Physical Exam  Constitutional: She appears well-developed and well-nourished.  Skin:       Several patches of red vesicles on the forearms          Assessment & Plan:  Given a steroid shot

## 2012-05-19 ENCOUNTER — Other Ambulatory Visit: Payer: Self-pay | Admitting: Family Medicine

## 2012-05-19 MED ORDER — CIPROFLOXACIN HCL 500 MG PO TABS: 500.0000 mg | ORAL_TABLET | Freq: Two times a day (BID) | ORAL | Status: DC

## 2012-05-19 NOTE — Progress Notes (Signed)
Quick Note:  Attempt to call both home and cell - Vm - LMTCB if questions - gave results and will call in med to [facility] for uti ______

## 2012-06-06 ENCOUNTER — Other Ambulatory Visit: Payer: Self-pay | Admitting: Family Medicine

## 2012-06-06 ENCOUNTER — Institutional Professional Consult (permissible substitution): Payer: Medicare Other | Admitting: Cardiovascular Disease

## 2012-06-15 ENCOUNTER — Ambulatory Visit (INDEPENDENT_AMBULATORY_CARE_PROVIDER_SITE_OTHER): Payer: Medicare Other | Admitting: Cardiovascular Disease

## 2012-06-15 ENCOUNTER — Encounter: Payer: Self-pay | Admitting: Cardiovascular Disease

## 2012-06-15 VITALS — BP 110/74 | HR 57 | Ht 68.0 in | Wt 174.0 lb

## 2012-06-15 DIAGNOSIS — I421 Obstructive hypertrophic cardiomyopathy: Secondary | ICD-10-CM

## 2012-06-15 NOTE — Progress Notes (Signed)
History of Present Illness: 77 yo female with history of HTN, OA, hypothyroidism who is here today to establish cardiology care. She was seen recently by Dr. Clent Ridges in primary care and was found to have a murmur. Echo on 05/11/12 with septal hypertrophy, increased LV outflow gradient and systolic anterior motion of the mitral valve. She feels great. No chest pain, SOB, palpitations, near syncope or syncope. No awareness of irregularities of her heart rhythm, palpitations. She has not been aware of any previous cardiac issues.   Primary Care Physician: Gershon Crane   Past Medical History  Diagnosis Date  . Allergy   . Allergic rhinitis   . Hypothyroidism   . Urinary tract infection   . Diverticulosis   . Hx of adenomatous colonic polyps 02/1999  . Tubulovillous adenoma of colon 12/2009    with HGD  . Low back pain     gets ESI from Dr. Sharolyn Douglas  . Arthritis   . Hypertension     Past Surgical History  Procedure Date  . Abdominal hysterectomy   . Bilateral salpingoophorectomy     see Debbora Dus NP for GYN exams  . Cataract extraction w/ intraocular lens  implant, bilateral 2012    bilateral    Current Outpatient Prescriptions  Medication Sig Dispense Refill  . acetaminophen (TYLENOL) 650 MG CR tablet Take 650 mg by mouth every 8 (eight) hours as needed.      . benazepril (LOTENSIN) 10 MG tablet Take 1 tablet (10 mg total) by mouth daily.  30 tablet  3  . Calcium Citrate-Vitamin D (CITRACAL MAXIMUM PO) Take by mouth.        . Cyanocobalamin (VITAMIN B 12 PO) Take 2,500 each by mouth daily.        Marland Kitchen levothyroxine (SYNTHROID, LEVOTHROID) 125 MCG tablet TAKE 1 TABLET BY MOUTH ONCE A DAY  30 tablet  3  . Multiple Vitamin (MULTIVITAMIN) tablet Take 1 tablet by mouth daily.        Marland Kitchen DISCONTD: ferrous sulfate 325 (65 FE) MG tablet Take 325 mg by mouth 2 (two) times daily.          Allergies  Allergen Reactions  . Cortisone Rash  . Penicillins     REACTION: unspecified  .  Propoxyphene-Acetaminophen     REACTION: tongue swelling  . Pseudoephedrine Hcl Er Rash and Other (See Comments)    Face gets red    History   Social History  . Marital Status: Married    Spouse Name: N/A    Number of Children: 2  . Years of Education: N/A   Occupational History  . Retired-school system    Social History Main Topics  . Smoking status: Never Smoker   . Smokeless tobacco: Never Used  . Alcohol Use: 0.5 oz/week    1 drink(s) per week  . Drug Use: No  . Sexually Active: Not on file   Other Topics Concern  . Not on file   Social History Narrative  . No narrative on file    Family History  Problem Relation Age of Onset  . Alcohol abuse Father   . Kidney failure Mother   . Colon cancer      family    Review of Systems:  As stated in the HPI and otherwise negative.   BP 110/74  Pulse 57  Ht 5\' 8"  (1.727 m)  Wt 174 lb (78.926 kg)  BMI 26.46 kg/m2  Physical Examination: General: Well developed, well nourished,  NAD HEENT: OP clear, mucus membranes moist SKIN: warm, dry. No rashes. Neuro: No focal deficits Musculoskeletal: Muscle strength 5/5 all ext Psychiatric: Mood and affect normal Neck: No JVD, no carotid bruits, no thyromegaly, no lymphadenopathy. Lungs:Clear bilaterally, no wheezes, rhonci, crackles Cardiovascular: Regular rate and rhythm. Harsh systolic murmur. No gallops or rubs. Abdomen:Soft. Bowel sounds present. Non-tender.  Extremities: No lower extremity edema. Pulses are 2 + in the bilateral DP/PT.  EKG:NSR, rate 70 bpm. LAD, LVH  Assessment and Plan:   1. Hypertrophic Cardiomyopathy: Echo  05/11/12 with asymmetric septal hypertrophy with systolic anterior motion of the mitral valve and elevated outflow tract gradient. She has no symptoms of palpitations, syncope, SOB, chest pain. She is very active. I have instructed her that she should avoid dehydration. If she has onset of any symptoms, would recommend stopping her Ace-inhibitor  which is an afterload reducing agent. Typically, I would start a beta blocker but she has a resting heart rate of 55-60 so a beta blocker is contraindicated. I will not make any changes in her medical therapy today. Repeat echo in one year.  She is given the common symptoms to look for and will call if she has any symptoms.

## 2012-06-15 NOTE — Patient Instructions (Addendum)
Your physician wants you to follow-up in:  12 months. You will receive a reminder letter in the mail two months in advance. If you don't receive a letter, please call our office to schedule the follow-up appointment.  Your physician has requested that you have an echocardiogram. Echocardiography is a painless test that uses sound waves to create images of your heart. It provides your doctor with information about the size and shape of your heart and how well your heart's chambers and valves are working. This procedure takes approximately one hour. There are no restrictions for this procedure. To be done in 12 months--week or so prior to appt with Dr. McAlhany      

## 2012-08-07 ENCOUNTER — Telehealth: Payer: Self-pay | Admitting: Family Medicine

## 2012-08-07 NOTE — Telephone Encounter (Signed)
Refill- benazepril hcl 10mg  tab. Take one tablet by mouth once a day. Qty 30 last fill 10.7.13

## 2012-08-08 MED ORDER — BENAZEPRIL HCL 10 MG PO TABS: 10.0000 mg | ORAL_TABLET | Freq: Every day | ORAL | Status: DC

## 2012-08-08 NOTE — Telephone Encounter (Signed)
I sent script e-scribe. 

## 2012-08-10 ENCOUNTER — Telehealth: Payer: Self-pay | Admitting: Family Medicine

## 2012-08-10 NOTE — Telephone Encounter (Signed)
She needs an OV to figure this out

## 2012-08-10 NOTE — Telephone Encounter (Signed)
Caller: [name]/Patient; Patient Name: Kim [name]; PCP: Gershon Crane [facility]); Best Callback Phone Number: [phone]; Reason for call: Urinary Pain.  Patient had Urinary tract infection in September and took antibiotic for the Urinary infection  Patient states that her symptoms of pressure and urgency never completely went away.  Urgent symptoms per Urinary Symptoms - Female protocol ruled out with exception of "Has one or more urinary tract symptom and has not previously been evaluated".  Home care advice given.  Patient states that she attempted to make an appointment today to see Dr. Clent Ridges and schedule was booked.  Patient declines seeing another provider.  Scheduled appointment for 08/11/12 at  0930.  Patient would like to recieve a callback from [name] to discuss symptoms. Patient states that she would like to have an antibiotic called in since she feels that the last antibiotic did not work.  Patient is aware that office visit is recommended however if antibiotic can be called in she would like it called in to [facility] and patient notified so she can cancel office appointment.  OFFICE NOTE: PLEASE SEE NOTE ABOVE.  EVEN THOUGH TRIAGED PATIENT IS REQUESTING A CALLBACK FROM [name] IN OFFICE.

## 2012-08-11 ENCOUNTER — Ambulatory Visit (INDEPENDENT_AMBULATORY_CARE_PROVIDER_SITE_OTHER): Payer: Medicare Other | Admitting: Family Medicine

## 2012-08-11 ENCOUNTER — Encounter: Payer: Self-pay | Admitting: Family Medicine

## 2012-08-11 VITALS — BP 110/66 | HR 86 | Temp 98.7°F | Wt 175.0 lb

## 2012-08-11 DIAGNOSIS — N390 Urinary tract infection, site not specified: Secondary | ICD-10-CM

## 2012-08-11 LAB — POCT URINALYSIS DIPSTICK
Bilirubin, UA: NEGATIVE
Glucose, UA: NEGATIVE
Ketones, UA: NEGATIVE
Leukocytes, UA: NEGATIVE
Protein, UA: NEGATIVE
Spec Grav, UA: 1.01
Urobilinogen, UA: 0.2
pH, UA: 5

## 2012-08-11 MED ORDER — CIPROFLOXACIN HCL 500 MG PO TABS: 500.0000 mg | ORAL_TABLET | Freq: Two times a day (BID) | ORAL | Status: DC

## 2012-08-11 NOTE — Progress Notes (Signed)
  Subjective:    Patient ID: Kim [name], female    DOB: 08/04/1935, 77 y.o.   MRN: [id number]  HPI Here for several days of urinary urgency and occasional incontinence. No burning or pain or fever.    Review of Systems  Constitutional: Negative.   Gastrointestinal: Negative.   Genitourinary: Positive for urgency and frequency. Negative for dysuria, hematuria, flank pain and pelvic pain.       Objective:   Physical Exam  Constitutional: She appears well-developed and well-nourished.  Abdominal: Soft. Bowel sounds are normal. She exhibits no distension and no mass. There is no tenderness. There is no rebound and no guarding.          Assessment & Plan:  Start on Cipro. Await the culture results

## 2012-08-11 NOTE — Telephone Encounter (Signed)
Can you call pt and schedule a office visit? 

## 2012-08-14 LAB — URINE CULTURE: Colony Count: >=100000

## 2012-08-14 MED ORDER — SULFAMETHOXAZOLE-TRIMETHOPRIM 800-160 MG PO TABS: 1.0000 | ORAL_TABLET | Freq: Two times a day (BID) | ORAL | Status: DC

## 2012-08-14 NOTE — Addendum Note (Signed)
Addended by: Aniceto Boss A on: 08/14/2012 02:01 PM   Modules accepted: Orders

## 2012-08-14 NOTE — Progress Notes (Signed)
Quick Note:  I spoke with pt and sent below script e-scribe to [facility] pharmacy. ______

## 2012-10-09 ENCOUNTER — Other Ambulatory Visit: Payer: Self-pay | Admitting: Family Medicine

## 2012-11-23 ENCOUNTER — Ambulatory Visit (INDEPENDENT_AMBULATORY_CARE_PROVIDER_SITE_OTHER): Payer: Medicare Other | Admitting: Family Medicine

## 2012-11-23 ENCOUNTER — Encounter: Payer: Self-pay | Admitting: Family Medicine

## 2012-11-23 VITALS — BP 148/80 | HR 90 | Temp 98.4°F | Wt 180.0 lb

## 2012-11-23 DIAGNOSIS — N390 Urinary tract infection, site not specified: Secondary | ICD-10-CM

## 2012-11-23 LAB — POCT URINALYSIS DIPSTICK
Bilirubin, UA: NEGATIVE
Glucose, UA: NEGATIVE
Ketones, UA: NEGATIVE
Spec Grav, UA: >=1.03
Urobilinogen, UA: 0.2
pH, UA: 6

## 2012-11-23 MED ORDER — NITROFURANTOIN MONOHYD MACRO 100 MG PO CAPS: 100.0000 mg | ORAL_CAPSULE | Freq: Two times a day (BID) | ORAL | Status: DC

## 2012-11-23 NOTE — Addendum Note (Signed)
Addended by: Aniceto Boss A on: 11/23/2012 11:02 AM   Modules accepted: Orders

## 2012-11-23 NOTE — Progress Notes (Signed)
  Subjective:    Patient ID: Kim [name], female    DOB: 08/06/1935, 78 y.o.   MRN: [id number]  HPI Here for 3 days of urinary burning and urgency. No fever or nausea.    Review of Systems  Constitutional: Negative.   Genitourinary: Positive for dysuria, urgency and frequency.       Objective:   Physical Exam  Constitutional: She appears well-developed and well-nourished.  Abdominal: Soft. Bowel sounds are normal. She exhibits no distension and no mass. There is no tenderness. There is no rebound and no guarding.          Assessment & Plan:  Given Macrobid. Culture the urine

## 2012-11-26 LAB — URINE CULTURE: Colony Count: >=100000

## 2012-11-28 NOTE — Progress Notes (Signed)
Quick Note:  I spoke with pt ______ 

## 2012-12-12 ENCOUNTER — Telehealth: Payer: Self-pay | Admitting: Family Medicine

## 2012-12-12 DIAGNOSIS — N390 Urinary tract infection, site not specified: Secondary | ICD-10-CM

## 2012-12-12 NOTE — Telephone Encounter (Signed)
I spoke with pt and she is not having any symptoms, she is just doing a follow up to make sure that all of the infection is gone, per Dr. Clent Ridges okay to put UA order in computer, which I did.

## 2012-12-12 NOTE — Telephone Encounter (Signed)
Caller: Kim [name]/Patient; Phone: [phone]; Reason for Call: Pt wants to have urine checked to make sure bacteria is gone.  Kim [name] advised her she could bring sample in for testing, please call her to advise when she can do this.

## 2012-12-12 NOTE — Telephone Encounter (Signed)
I will call pt.  

## 2012-12-13 ENCOUNTER — Other Ambulatory Visit (INDEPENDENT_AMBULATORY_CARE_PROVIDER_SITE_OTHER): Payer: Medicare Other

## 2012-12-13 DIAGNOSIS — N390 Urinary tract infection, site not specified: Secondary | ICD-10-CM

## 2012-12-13 LAB — POCT URINALYSIS DIPSTICK
Bilirubin, UA: NEGATIVE
Glucose, UA: NEGATIVE
Ketones, UA: NEGATIVE
Nitrite, UA: POSITIVE
Protein, UA: NEGATIVE
Spec Grav, UA: 1.02
Urobilinogen, UA: 0.2
pH, UA: 5.5

## 2012-12-16 LAB — URINE CULTURE: Colony Count: >=100000

## 2012-12-19 ENCOUNTER — Telehealth: Payer: Self-pay | Admitting: Family Medicine

## 2012-12-19 NOTE — Telephone Encounter (Signed)
Pt was here on 11/23/12 for UTI and given the Macrobid at that time. Pt finished this on 12/02/12 and came by to give a urine specimen on 12/13/12. Does she need another round of medication?

## 2012-12-19 NOTE — Progress Notes (Signed)
Quick Note:  I spoke with pt ______ 

## 2012-12-19 NOTE — Telephone Encounter (Signed)
Both cultures grew the same bacteria. Call in another round of Macrobid 100 mg bid, but go for 14 days this time (total of #28)

## 2012-12-20 MED ORDER — NITROFURANTOIN MONOHYD MACRO 100 MG PO CAPS: 100.0000 mg | ORAL_CAPSULE | Freq: Two times a day (BID) | ORAL | Status: DC

## 2012-12-20 NOTE — Telephone Encounter (Signed)
I sent script e-scribe and left voice message for pt 

## 2012-12-27 ENCOUNTER — Telehealth: Payer: Self-pay | Admitting: Family Medicine

## 2012-12-27 DIAGNOSIS — L989 Disorder of the skin and subcutaneous tissue, unspecified: Secondary | ICD-10-CM

## 2012-12-27 DIAGNOSIS — N390 Urinary tract infection, site not specified: Secondary | ICD-10-CM

## 2012-12-27 NOTE — Telephone Encounter (Signed)
Patient called stating that she need a referral to Dermatology for the spots on her face and they told her because she has medicare/medicaid she has to have a referral. Patient prefers to see Dr. Karlyn Agee ph. # [phone]. Please assist. Also patient is on abx for her kidney infection and would like to have a ua done when she is done with the meds.

## 2012-12-27 NOTE — Telephone Encounter (Signed)
Appt scheduled

## 2012-12-27 NOTE — Telephone Encounter (Signed)
The referral was done. She can do a UA one week after her antibiotics are done

## 2012-12-27 NOTE — Telephone Encounter (Signed)
I put future lab order in computer and spoke with pt.  

## 2013-01-16 ENCOUNTER — Other Ambulatory Visit (INDEPENDENT_AMBULATORY_CARE_PROVIDER_SITE_OTHER): Payer: Medicare Other

## 2013-01-16 DIAGNOSIS — N390 Urinary tract infection, site not specified: Secondary | ICD-10-CM

## 2013-01-16 LAB — POCT URINALYSIS DIPSTICK
Bilirubin, UA: NEGATIVE
Glucose, UA: NEGATIVE
Ketones, UA: NEGATIVE
Leukocytes, UA: NEGATIVE
Nitrite, UA: NEGATIVE
Protein, UA: NEGATIVE
Spec Grav, UA: 1.02
Urobilinogen, UA: 0.2
pH, UA: 6

## 2013-01-22 NOTE — Progress Notes (Signed)
Quick Note:  I spoke with pt ______ 

## 2013-02-12 ENCOUNTER — Other Ambulatory Visit: Payer: Self-pay | Admitting: Family Medicine

## 2013-02-19 ENCOUNTER — Other Ambulatory Visit: Payer: Self-pay

## 2013-02-19 DIAGNOSIS — Z1231 Encounter for screening mammogram for malignant neoplasm of breast: Secondary | ICD-10-CM

## 2013-03-28 ENCOUNTER — Ambulatory Visit
Admission: RE | Admit: 2013-03-28 | Discharge: 2013-03-28 | Disposition: A | Discharge status: Home / Self Care | Payer: Medicare Other | Source: Ambulatory Visit

## 2013-03-28 DIAGNOSIS — Z1231 Encounter for screening mammogram for malignant neoplasm of breast: Secondary | ICD-10-CM

## 2013-03-29 ENCOUNTER — Other Ambulatory Visit: Payer: Self-pay | Admitting: Family Medicine

## 2013-03-29 DIAGNOSIS — R928 Other abnormal and inconclusive findings on diagnostic imaging of breast: Secondary | ICD-10-CM

## 2013-04-13 ENCOUNTER — Ambulatory Visit
Admission: RE | Admit: 2013-04-13 | Discharge: 2013-04-13 | Disposition: A | Discharge status: Home / Self Care | Payer: Medicare Other | Source: Ambulatory Visit | Attending: Family Medicine | Admitting: Family Medicine

## 2013-04-13 DIAGNOSIS — R928 Other abnormal and inconclusive findings on diagnostic imaging of breast: Secondary | ICD-10-CM

## 2013-06-20 ENCOUNTER — Other Ambulatory Visit: Payer: Self-pay | Admitting: Family Medicine

## 2013-06-20 NOTE — Telephone Encounter (Signed)
Can we refill this? 

## 2013-06-20 NOTE — Telephone Encounter (Signed)
Okay for one year  

## 2013-06-26 ENCOUNTER — Encounter: Payer: Self-pay | Admitting: Family

## 2013-06-26 ENCOUNTER — Ambulatory Visit (INDEPENDENT_AMBULATORY_CARE_PROVIDER_SITE_OTHER): Payer: Medicare Other | Admitting: Family

## 2013-06-26 VITALS — BP 142/78 | HR 73 | Wt 177.0 lb

## 2013-06-26 DIAGNOSIS — N390 Urinary tract infection, site not specified: Secondary | ICD-10-CM

## 2013-06-26 DIAGNOSIS — R300 Dysuria: Secondary | ICD-10-CM

## 2013-06-26 LAB — POCT URINALYSIS DIPSTICK
Bilirubin, UA: NEGATIVE
Glucose, UA: NEGATIVE
Ketones, UA: NEGATIVE
Nitrite, UA: POSITIVE
Protein, UA: NEGATIVE
Spec Grav, UA: 1.01
Urobilinogen, UA: 0.2
pH, UA: 6

## 2013-06-26 MED ORDER — CIPROFLOXACIN HCL 500 MG PO TABS: 500.0000 mg | ORAL_TABLET | Freq: Two times a day (BID) | ORAL | Status: DC

## 2013-06-26 NOTE — Patient Instructions (Addendum)
Urinary Tract Infection  Urinary tract infections (UTIs) can develop anywhere along your urinary tract. Your urinary tract is your body's drainage system for removing wastes and extra water. Your urinary tract includes two kidneys, two ureters, a bladder, and a urethra. Your kidneys are a pair of bean-shaped organs. Each kidney is about the size of your fist. They are located below your ribs, one on each side of your spine.  CAUSES  Infections are caused by microbes, which are microscopic organisms, including fungi, viruses, and bacteria. These organisms are so small that they can only be seen through a microscope. Bacteria are the microbes that most commonly cause UTIs.  SYMPTOMS   Symptoms of UTIs may vary by age and gender of the patient and by the location of the infection. Symptoms in young women typically include a frequent and intense urge to urinate and a painful, burning feeling in the bladder or urethra during urination. Older women and men are more likely to be tired, shaky, and weak and have muscle aches and abdominal pain. A fever may mean the infection is in your kidneys. Other symptoms of a kidney infection include pain in your back or sides below the ribs, nausea, and vomiting.  DIAGNOSIS  To diagnose a UTI, your caregiver will ask you about your symptoms. Your caregiver also will ask to provide a urine sample. The urine sample will be tested for bacteria and white blood cells. White blood cells are made by your body to help fight infection.  TREATMENT   Typically, UTIs can be treated with medication. Because most UTIs are caused by a bacterial infection, they usually can be treated with the use of antibiotics. The choice of antibiotic and length of treatment depend on your symptoms and the type of bacteria causing your infection.  HOME CARE INSTRUCTIONS   If you were prescribed antibiotics, take them exactly as your caregiver instructs you. Finish the medication even if you feel better after you  have only taken some of the medication.   Drink enough water and fluids to keep your urine clear or pale yellow.   Avoid caffeine, tea, and carbonated beverages. They tend to irritate your bladder.   Empty your bladder often. Avoid holding urine for long periods of time.   Empty your bladder before and after sexual intercourse.   After a bowel movement, women should cleanse from front to back. Use each tissue only once.  SEEK MEDICAL CARE IF:    You have back pain.   You develop a fever.   Your symptoms do not begin to resolve within 3 days.  SEEK IMMEDIATE MEDICAL CARE IF:    You have severe back pain or lower abdominal pain.   You develop chills.   You have nausea or vomiting.   You have continued burning or discomfort with urination.  MAKE SURE YOU:    Understand these instructions.   Will watch your condition.   Will get help right away if you are not doing well or get worse.  Document Released: 06/30/2005 Document Revised: 03/21/2012 Document Reviewed: 10/29/2011  Exit[employer] Patient Information 2014 [employer].

## 2013-06-26 NOTE — Progress Notes (Signed)
Subjective:    Patient ID: Kim [name], female    DOB: 1935/05/01, 78 y.o.   MRN: [id number]  HPI 78 year old white female, patient of Dr. Clent Ridges is in today with complaints of urinary frequency, urgency, burning with urination x2 days and worsening. Last urinary tract infection was March 2014. Reports having several the last year. She's not sexually active.   Review of Systems  Constitutional: Negative.   Respiratory: Negative.   Cardiovascular: Negative.   Gastrointestinal: Negative.   Genitourinary: Positive for dysuria and urgency.  Musculoskeletal: Negative.  Negative for arthralgias.  Psychiatric/Behavioral: Negative.    Past Medical History  Diagnosis Date  . Allergy   . Allergic rhinitis   . Hypothyroidism   . Urinary tract infection   . Diverticulosis   . Hx of adenomatous colonic polyps 02/1999  . Tubulovillous adenoma of colon 12/2009    with HGD  . Low back pain     gets ESI from Dr. Sharolyn Douglas  . Arthritis   . Hypertension     History   Social History  . Marital Status: Married    Spouse Name: N/A    Number of Children: 2  . Years of Education: N/A   Occupational History  . Retired-school system    Social History Main Topics  . Smoking status: Never Smoker   . Smokeless tobacco: Never Used  . Alcohol Use: No  . Drug Use: No  . Sexual Activity: Not on file   Other Topics Concern  . Not on file   Social History Narrative  . No narrative on file    Past Surgical History  Procedure Laterality Date  . Abdominal hysterectomy    . Bilateral salpingoophorectomy      see Debbora Dus NP for GYN exams  . Cataract extraction w/ intraocular lens  implant, bilateral  2012    bilateral    Family History  Problem Relation Age of Onset  . Alcohol abuse Father   . Kidney failure Mother   . Colon cancer      family    Allergies  Allergen Reactions  . Cortisone Rash  . Penicillins     REACTION: unspecified  . Propoxyphene-Acetaminophen    REACTION: tongue swelling  . Pseudoephedrine Hcl Er Rash and Other (See Comments)    Face gets red    Current Outpatient Prescriptions on File Prior to Visit  Medication Sig Dispense Refill  . benazepril (LOTENSIN) 10 MG tablet Take 1 tablet (10 mg total) by mouth daily.  30 tablet  10  . levothyroxine (SYNTHROID, LEVOTHROID) 125 MCG tablet TAKE 1 TABLET BY MOUTH ONCE A DAY  30 tablet  4  . acetaminophen (TYLENOL) 650 MG CR tablet Take 650 mg by mouth every 8 (eight) hours as needed.      . Calcium Citrate-Vitamin D (CITRACAL MAXIMUM PO) Take by mouth.        . cetirizine (ZYRTEC) 10 MG tablet Take 10 mg by mouth daily as needed for allergies.      . Cyanocobalamin (VITAMIN B 12 PO) Take 2,500 each by mouth daily.        Marland Kitchen levothyroxine (SYNTHROID, LEVOTHROID) 125 MCG tablet TAKE 1 TABLET BY MOUTH ONCE A DAY  30 tablet  11  . Multiple Vitamin (MULTIVITAMIN) tablet Take 1 tablet by mouth daily.        . nitrofurantoin, macrocrystal-monohydrate, (MACROBID) 100 MG capsule Take 1 capsule (100 mg total) by mouth 2 (two) times daily.  28 capsule  0  . [DISCONTINUED] ferrous sulfate 325 (65 FE) MG tablet Take 325 mg by mouth 2 (two) times daily.         No current facility-administered medications on file prior to visit.    BP 142/78  Pulse 73  Wt 177 lb (80.287 kg)  BMI 26.92 kg/m2chart    Objective:   Physical Exam  Constitutional: She is oriented to person, place, and time. She appears well-developed and well-nourished.  Neck: Normal range of motion. Neck supple.  Cardiovascular: Normal rate, regular rhythm and normal heart sounds.   Pulmonary/Chest: Effort normal and breath sounds normal.  Abdominal: Soft. Bowel sounds are normal.  Neurological: She is alert and oriented to person, place, and time.  Skin: Skin is warm and dry.  Psychiatric: She has a normal mood and affect.          Assessment & Plan:  Assessment: 1. UTI 2. Dysuria  Plan: Cipro 500 mg one tablet twice a  day x7 days due to her history. Urine culture sent. Call the office with any questions or concerns. Recheck as scheduled, and as needed.

## 2013-06-28 LAB — URINE CULTURE: Colony Count: >=100000

## 2013-07-18 ENCOUNTER — Telehealth: Payer: Self-pay | Admitting: Family Medicine

## 2013-07-18 NOTE — Telephone Encounter (Signed)
Pt aware urine cx showed e coli

## 2013-07-18 NOTE — Telephone Encounter (Signed)
Pt saw NP on 06-26-13. Pt would like results

## 2013-07-31 ENCOUNTER — Other Ambulatory Visit: Payer: Self-pay | Admitting: Dermatology

## 2013-08-17 ENCOUNTER — Telehealth: Payer: Self-pay | Admitting: Family Medicine

## 2013-08-17 DIAGNOSIS — N390 Urinary tract infection, site not specified: Secondary | ICD-10-CM

## 2013-08-17 NOTE — Telephone Encounter (Signed)
Pt states that on last visit she and Dr. Clent Ridges discussed a referral to a urologist for her bladder infections, pt would like a referral set up. Please advise.

## 2013-08-17 NOTE — Telephone Encounter (Signed)
The referral was done  

## 2013-08-17 NOTE — Telephone Encounter (Signed)
I spoke with pt  

## 2013-09-18 ENCOUNTER — Other Ambulatory Visit: Payer: Self-pay | Admitting: Family Medicine

## 2013-09-18 DIAGNOSIS — R921 Mammographic calcification found on diagnostic imaging of breast: Secondary | ICD-10-CM

## 2013-10-04 DIAGNOSIS — Z923 Personal history of irradiation: Secondary | ICD-10-CM

## 2013-10-04 HISTORY — DX: Personal history of irradiation: Z92.3

## 2013-10-12 ENCOUNTER — Other Ambulatory Visit: Payer: Self-pay | Admitting: Family Medicine

## 2013-10-12 ENCOUNTER — Other Ambulatory Visit: Payer: Self-pay

## 2013-10-12 DIAGNOSIS — R921 Mammographic calcification found on diagnostic imaging of breast: Secondary | ICD-10-CM

## 2013-10-16 ENCOUNTER — Ambulatory Visit
Admission: RE | Admit: 2013-10-16 | Discharge: 2013-10-16 | Disposition: A | Discharge status: Home / Self Care | Payer: Medicare Other | Source: Ambulatory Visit | Attending: Family Medicine | Admitting: Family Medicine

## 2013-10-16 ENCOUNTER — Other Ambulatory Visit: Payer: Self-pay | Admitting: Family Medicine

## 2013-10-16 DIAGNOSIS — R921 Mammographic calcification found on diagnostic imaging of breast: Secondary | ICD-10-CM

## 2013-10-18 ENCOUNTER — Ambulatory Visit
Admission: RE | Admit: 2013-10-18 | Discharge: 2013-10-18 | Disposition: A | Discharge status: Home / Self Care | Payer: Medicare Other | Source: Ambulatory Visit | Attending: Family Medicine | Admitting: Family Medicine

## 2013-10-18 DIAGNOSIS — R921 Mammographic calcification found on diagnostic imaging of breast: Secondary | ICD-10-CM

## 2013-10-18 HISTORY — PX: BREAST BIOPSY: SHX20

## 2013-10-19 ENCOUNTER — Other Ambulatory Visit: Payer: Self-pay | Admitting: Family Medicine

## 2013-10-19 DIAGNOSIS — N6489 Other specified disorders of breast: Secondary | ICD-10-CM

## 2013-10-19 DIAGNOSIS — D0510 Intraductal carcinoma in situ of unspecified breast: Secondary | ICD-10-CM

## 2013-10-26 ENCOUNTER — Encounter (INDEPENDENT_AMBULATORY_CARE_PROVIDER_SITE_OTHER): Payer: Self-pay | Admitting: General Surgery

## 2013-10-26 ENCOUNTER — Ambulatory Visit
Admission: RE | Admit: 2013-10-26 | Discharge: 2013-10-26 | Disposition: A | Discharge status: Home / Self Care | Payer: Medicare Other | Source: Ambulatory Visit | Attending: Family Medicine | Admitting: Family Medicine

## 2013-10-26 ENCOUNTER — Ambulatory Visit (INDEPENDENT_AMBULATORY_CARE_PROVIDER_SITE_OTHER): Payer: Medicare Other | Admitting: General Surgery

## 2013-10-26 ENCOUNTER — Ambulatory Visit
Admission: RE | Admit: 2013-10-26 | Discharge: 2013-10-26 | Disposition: A | Discharge status: Home / Self Care | Payer: 59 | Source: Ambulatory Visit | Attending: Family Medicine | Admitting: Family Medicine

## 2013-10-26 VITALS — BP 128/68 | HR 78 | Temp 97.9°F | Resp 16 | Ht 68.0 in | Wt 178.2 lb

## 2013-10-26 DIAGNOSIS — D0510 Intraductal carcinoma in situ of unspecified breast: Secondary | ICD-10-CM

## 2013-10-26 DIAGNOSIS — D0590 Unspecified type of carcinoma in situ of unspecified breast: Secondary | ICD-10-CM

## 2013-10-26 DIAGNOSIS — N6489 Other specified disorders of breast: Secondary | ICD-10-CM

## 2013-10-26 MED ORDER — GADOBENATE DIMEGLUMINE 529 MG/ML IV SOLN
20.0000 mL | Freq: Once | INTRAVENOUS | Status: AC | PRN
  Administered 2013-10-26: 20 mL via INTRAVENOUS

## 2013-10-26 NOTE — Progress Notes (Signed)
Chief Complaint: New diagnosis of breast cancer  History:    Kim [name] is a 79 y.o. postmenopausal female referred by Dr. Margarette Canada  for evaluation of recently diagnosed carcinoma of the left breast. She recently presented for a screening mamogram 6 months ago revealing a small area of calcifications in the upper outer quadrant left breast. Short-term follow up was recommended in 6 months and this was just performed showing a slight increase in the number of calcifications..     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 She is seen now in office for initial treatment planning.  She has experienced no breast symptoms, specifically lump or skin changes or nipple discharge..  She does not have a personal history of any previous breast problems.  Bilateral breast MR was performed today showing just a biopsy site hematoma at the area of the clip with no enhancing masses in either breast. However a slightly enlarged morphologically abnormal lymph node was noted in the left axilla.  Findings at that time were the following:  Tumor size: 1 cm  Tumor grade: 3  Estrogen Receptor: positive Progesterone Receptor: positive  Her-2 neu: not performed  Lymph node status: abnormal left axillary lymph node noted on MRI of questionable significance insetting of DCIS   Past Medical History  Diagnosis Date  . Allergy   . Allergic rhinitis   . Hypothyroidism   . Urinary tract infection   . Diverticulosis   . Hx of adenomatous colonic polyps 02/1999  . Tubulovillous adenoma of colon 12/2009    with HGD  . Low back pain     gets ESI from Dr. Rennis Harding  . Arthritis   . Hypertension     Past Surgical History  Procedure Laterality Date  . Abdominal hysterectomy    . Bilateral salpingoophorectomy      see Laurin Coder NP for GYN exams  . Cataract extraction w/ intraocular lens  implant, bilateral  2012    bilateral    Current  Outpatient Prescriptions  Medication Sig Dispense Refill  . acetaminophen (TYLENOL) 650 MG CR tablet Take 650 mg by mouth every 8 (eight) hours as needed.      . Calcium Citrate-Vitamin D (CITRACAL MAXIMUM PO) Take by mouth.        . cetirizine (ZYRTEC) 10 MG tablet Take 10 mg by mouth daily as needed for allergies.      . ciprofloxacin (CIPRO) 500 MG tablet Take 1 tablet (500 mg total) by mouth 2 (two) times daily.  14 tablet  0  . levothyroxine (SYNTHROID, LEVOTHROID) 125 MCG tablet TAKE 1 TABLET BY MOUTH ONCE A DAY  30 tablet  4  . levothyroxine (SYNTHROID, LEVOTHROID) 125 MCG tablet TAKE 1 TABLET BY MOUTH ONCE A DAY  30 tablet  11  . Multiple Vitamin (MULTIVITAMIN) tablet Take 1 tablet by mouth daily.        . benazepril (LOTENSIN) 10 MG tablet Take 1 tablet (10 mg total) by mouth daily.  30 tablet  10  . [DISCONTINUED] ferrous sulfate 325 (65 FE) MG tablet Take 325 mg by mouth 2 (two) times daily.         No current facility-administered medications for this visit.    Family History  Problem Relation Age of Onset  . Alcohol abuse Father   . Kidney failure Mother   . Colon cancer      family    History  Social History  . Marital Status: Married    Spouse Name: N/A    Number of Children: 2  . Years of Education: N/A   Occupational History  . Retired-school system    Social History Main Topics  . Smoking status: Never Smoker   . Smokeless tobacco: Never Used  . Alcohol Use: No  . Drug Use: No  . Sexual Activity: None   Other Topics Concern  . None   Social History Narrative  . None     Review of Systems A comprehensive review of systems was negative.     Objective:  BP 128/68  Pulse 78  Temp(Src) 97.9 F (36.6 C) (Temporal)  Resp 16  Ht $R'5\' 8"'Jg$  (1.727 m)  Wt 178 lb 3.2 oz (80.831 kg)  BMI 27.10 kg/m2  General: Alert, well-developed Caucasian female, in no distress Skin: Warm and dry without rash or infection. HEENT: No palpable masses or thyromegaly.  Sclera nonicteric. Pupils equal round and reactive. Oropharynx clear. Breasts: some bruising and thickening in the upper outer left breast post biopsy. No other palpable abnormalities or skin changes or nipple discharge. Lymph nodes: No cervical, supraclavicular, or inguinal nodes palpable. Lungs: Breath sounds clear and equal without increased work of breathing Cardiovascular: Regular rate and rhythm without murmur. No JVD or edema. Peripheral pulses intact. Abdomen: Nondistended. Soft and nontender. No masses palpable. No organomegaly. No palpable hernias. Extremities: No edema or joint swelling or deformity. No chronic venous stasis changes. Neurologic: Alert and fully oriented. Gait normal.   Laboratory data:  CBC:  Lab Results  Component Value Date   WBC 5.0 05/18/2012   RBC 4.48 05/18/2012   HGB 13.8 05/18/2012   HCT 41.1 05/18/2012   PLT 124.0* 05/18/2012  ]  [facility] Labs:  Lab Results  Component Value Date   NA 138 05/18/2012   K 4.3 05/18/2012   CL 102 05/18/2012   CO2 29 05/18/2012   BUN 19 05/18/2012   CREATININE 0.9 05/18/2012   CALCIUM 9.6 05/18/2012   PROT 7.2 05/18/2012   BILITOT 0.8 05/18/2012   BILIDIR 0.1 05/18/2012   ALKPHOS 68 05/18/2012   AST 11 05/18/2012   ALT 11 05/18/2012     Assessment  79 y.o. female with a new diagnosis of cancer of the the left breast upper outer quadrant.  Clinical 0, estrogen receptor positive. I discussed with the patient and family members present today initial surgical treatment options. We discussed options of breast conservation with lumpectomy or total mastectomy and sentinal lymph node biopsy/dissection. Options for reconstruction were discussed. After discussion they have elected to proceed with left partial mastectomy.  We discussed the indications and nature of the procedure, and expected recovery, in detail. Surgical risks including anesthetic complications, cardiorespiratory complications, bleeding, infection, wound healing complications,  blood clots, lymphedema, local and distant recurrence and possible need for further surgery based on the final pathology was discussed and understood.  Chemotherapy, hormonal therapy and radiation therapy have been discussed. They have been provided with literature regarding the treatment of breast cancer.  All questions were answered. They understand and agree to proceed and we will go ahead with scheduling. We will go ahead and schedule her for evaluation of her left axilla with ultrasound and possible biopsy.  Plan left partial mastectomy with needle localization under general anesthesia as an outpatient. We will need to obtain the results of her axillary ultrasound and possible biopsy prior to surgery.  Edward Jolly MD, FACS  10/26/2013, 2:54 PM

## 2013-10-29 ENCOUNTER — Other Ambulatory Visit (INDEPENDENT_AMBULATORY_CARE_PROVIDER_SITE_OTHER): Payer: Self-pay | Admitting: General Surgery

## 2013-10-29 DIAGNOSIS — D0510 Intraductal carcinoma in situ of unspecified breast: Secondary | ICD-10-CM

## 2013-10-31 ENCOUNTER — Encounter (HOSPITAL_BASED_OUTPATIENT_CLINIC_OR_DEPARTMENT_OTHER): Payer: Self-pay | Admitting: *Deleted

## 2013-10-31 ENCOUNTER — Ambulatory Visit
Admission: RE | Admit: 2013-10-31 | Discharge: 2013-10-31 | Disposition: A | Discharge status: Home / Self Care | Payer: Medicare Other | Source: Ambulatory Visit | Attending: General Surgery | Admitting: General Surgery

## 2013-10-31 DIAGNOSIS — D0510 Intraductal carcinoma in situ of unspecified breast: Secondary | ICD-10-CM

## 2013-10-31 HISTORY — PX: BREAST BIOPSY: SHX20

## 2013-10-31 NOTE — Progress Notes (Signed)
Had node bx today-surgery may chg from lumpectomy to lump with SNBX-will wait till Friday to come for labs in case orders chg

## 2013-11-01 ENCOUNTER — Telehealth (INDEPENDENT_AMBULATORY_CARE_PROVIDER_SITE_OTHER): Payer: Self-pay | Admitting: *Deleted

## 2013-11-01 NOTE — Telephone Encounter (Signed)
Patient called asking about her surgery and asking if lymph nodes would be included since her surg path results came back from yesterday stating she had lymph node involvement.  I explained that we would discuss this with Dr. Excell Seltzer tomorrow then give her a call with an update.  Patient is anxious about everything going on and wants to make sure something doesn't get missed.

## 2013-11-02 ENCOUNTER — Other Ambulatory Visit: Payer: Self-pay

## 2013-11-02 ENCOUNTER — Other Ambulatory Visit (INDEPENDENT_AMBULATORY_CARE_PROVIDER_SITE_OTHER): Payer: Self-pay | Admitting: General Surgery

## 2013-11-02 ENCOUNTER — Encounter (HOSPITAL_BASED_OUTPATIENT_CLINIC_OR_DEPARTMENT_OTHER)
Admission: RE | Admit: 2013-11-02 | Discharge: 2013-11-02 | Disposition: A | Discharge status: Home / Self Care | Payer: Medicare Other | Source: Ambulatory Visit | Attending: General Surgery | Admitting: General Surgery

## 2013-11-02 ENCOUNTER — Telehealth (INDEPENDENT_AMBULATORY_CARE_PROVIDER_SITE_OTHER): Payer: Self-pay | Admitting: General Surgery

## 2013-11-02 LAB — BASIC METABOLIC PANEL
BUN: 20 mg/dL (ref 6–23)
CO2: 26 mEq/L (ref 19–32)
Calcium: 9.2 mg/dL (ref 8.4–10.5)
Chloride: 101 mEq/L (ref 96–112)
Creatinine, Ser: 0.95 mg/dL (ref 0.50–1.10)
GFR calc Af Amer: 66 mL/min — ABNORMAL LOW (ref >90)
GFR calc non Af Amer: 57 mL/min — ABNORMAL LOW (ref >90)
Glucose, Bld: 86 mg/dL (ref 70–99)
Potassium: 4.2 mEq/L (ref 3.7–5.3)
Sodium: 138 mEq/L (ref 137–147)

## 2013-11-02 NOTE — Progress Notes (Signed)
Pt here for labs and ekg-called dr Excell Seltzer because pt said they may need to do axillary nodes=he talked with pt-has to have axillary diss, Cbc and cmet done per dr h-showed pt and husband overnight rooms and reviewed all preop and post op care

## 2013-11-02 NOTE — Telephone Encounter (Signed)
I discussed with the patient by phone that biopsy of her axillary lymph node revealed metastatic breast cancer. We discussed that this was a surprising finding considering her breast biopsy showed only in situ disease. There is no other evidence of any abnormality in the breast on MRI. I have recommended adding axillary dissection to her needle localized lumpectomy. I discussed the procedure with her including indications and nature of the surgery and recovery as well as risks of lymphedema and nerve injury. She is agreeable to proceeding.

## 2013-11-04 HISTORY — PX: BREAST SURGERY: SHX581

## 2013-11-05 ENCOUNTER — Other Ambulatory Visit: Payer: Medicare Other

## 2013-11-05 ENCOUNTER — Encounter (HOSPITAL_BASED_OUTPATIENT_CLINIC_OR_DEPARTMENT_OTHER): Payer: Self-pay | Admitting: *Deleted

## 2013-11-05 ENCOUNTER — Ambulatory Visit (HOSPITAL_BASED_OUTPATIENT_CLINIC_OR_DEPARTMENT_OTHER)
Admission: RE | Admit: 2013-11-05 | Discharge: 2013-11-06 | Disposition: A | Discharge status: Home / Self Care | Payer: Medicare Other | Source: Ambulatory Visit | Attending: General Surgery | Admitting: General Surgery

## 2013-11-05 ENCOUNTER — Ambulatory Visit (HOSPITAL_BASED_OUTPATIENT_CLINIC_OR_DEPARTMENT_OTHER): Payer: Medicare Other | Admitting: Anesthesiology

## 2013-11-05 ENCOUNTER — Encounter (HOSPITAL_BASED_OUTPATIENT_CLINIC_OR_DEPARTMENT_OTHER): Payer: Medicare Other | Admitting: Anesthesiology

## 2013-11-05 ENCOUNTER — Encounter (HOSPITAL_BASED_OUTPATIENT_CLINIC_OR_DEPARTMENT_OTHER)
Admission: RE | Disposition: A | Discharge status: Home / Self Care | Payer: Self-pay | Source: Ambulatory Visit | Attending: General Surgery

## 2013-11-05 ENCOUNTER — Ambulatory Visit
Admission: RE | Admit: 2013-11-05 | Discharge: 2013-11-05 | Disposition: A | Discharge status: Home / Self Care | Payer: Medicare Other | Source: Ambulatory Visit | Attending: General Surgery | Admitting: General Surgery

## 2013-11-05 DIAGNOSIS — E039 Hypothyroidism, unspecified: Secondary | ICD-10-CM | POA: Insufficient documentation

## 2013-11-05 DIAGNOSIS — Z170 Estrogen receptor positive status [ER+]: Secondary | ICD-10-CM | POA: Diagnosis present

## 2013-11-05 DIAGNOSIS — I10 Essential (primary) hypertension: Secondary | ICD-10-CM | POA: Insufficient documentation

## 2013-11-05 DIAGNOSIS — D0590 Unspecified type of carcinoma in situ of unspecified breast: Secondary | ICD-10-CM

## 2013-11-05 DIAGNOSIS — C773 Secondary and unspecified malignant neoplasm of axilla and upper limb lymph nodes: Secondary | ICD-10-CM | POA: Insufficient documentation

## 2013-11-05 DIAGNOSIS — D0510 Intraductal carcinoma in situ of unspecified breast: Secondary | ICD-10-CM

## 2013-11-05 DIAGNOSIS — C50412 Malignant neoplasm of upper-outer quadrant of left female breast: Secondary | ICD-10-CM | POA: Diagnosis present

## 2013-11-05 HISTORY — PX: BREAST LUMPECTOMY: SHX2

## 2013-11-05 HISTORY — PX: BREAST LUMPECTOMY WITH NEEDLE LOCALIZATION AND AXILLARY LYMPH NODE DISSECTION: SHX5758

## 2013-11-05 HISTORY — DX: Presence of external hearing-aid: Z97.4

## 2013-11-05 HISTORY — DX: Unspecified hearing loss, unspecified ear: H91.90

## 2013-11-05 LAB — CBC WITH DIFFERENTIAL/PLATELET
Basophils Absolute: 0 10*3/uL (ref 0.0–0.1)
Basophils Relative: 1 % (ref 0–1)
Eosinophils Absolute: 0.1 10*3/uL (ref 0.0–0.7)
Eosinophils Relative: 1 % (ref 0–5)
HCT: 38.7 % (ref 36.0–46.0)
Hemoglobin: 13.1 g/dL (ref 12.0–15.0)
Lymphocytes Relative: 24 % (ref 12–46)
Lymphs Abs: 1.3 10*3/uL (ref 0.7–4.0)
MCH: 30 pg (ref 26.0–34.0)
MCHC: 33.9 g/dL (ref 30.0–36.0)
MCV: 88.8 fL (ref 78.0–100.0)
Monocytes Absolute: 0.4 10*3/uL (ref 0.1–1.0)
Monocytes Relative: 8 % (ref 3–12)
Neutro Abs: 3.6 10*3/uL (ref 1.7–7.7)
Neutrophils Relative %: 67 % (ref 43–77)
Platelets: 134 10*3/uL — ABNORMAL LOW (ref 150–400)
RBC: 4.36 MIL/uL (ref 3.87–5.11)
RDW: 13.2 % (ref 11.5–15.5)
WBC: 5.4 10*3/uL (ref 4.0–10.5)

## 2013-11-05 SURGERY — BREAST LUMPECTOMY WITH NEEDLE LOCALIZATION AND AXILLARY LYMPH NODE DISSECTION
Anesthesia: General | Site: Breast | Laterality: Left

## 2013-11-05 MED ORDER — SODIUM BICARBONATE 4 % IV SOLN
INTRAVENOUS | Status: AC
  Filled 2013-11-05: qty 5

## 2013-11-05 MED ORDER — OXYCODONE HCL 5 MG/5ML PO SOLN: 5.0000 mg | Freq: Once | ORAL | Status: DC | PRN

## 2013-11-05 MED ORDER — MORPHINE SULFATE 2 MG/ML IJ SOLN
2.0000 mg | INTRAMUSCULAR | Status: DC | PRN
  Administered 2013-11-05: 2 mg via INTRAVENOUS
  Filled 2013-11-05: qty 1

## 2013-11-05 MED ORDER — LIDOCAINE HCL (PF) 1 % IJ SOLN
INTRAMUSCULAR | Status: AC
  Filled 2013-11-05: qty 30

## 2013-11-05 MED ORDER — FENTANYL CITRATE 0.05 MG/ML IJ SOLN
INTRAMUSCULAR | Status: DC | PRN
Start: 2013-11-05 — End: 2013-11-05
  Administered 2013-11-05 (×4): 25 ug via INTRAVENOUS
  Administered 2013-11-05: 50 ug via INTRAVENOUS

## 2013-11-05 MED ORDER — LEVOTHYROXINE SODIUM 125 MCG PO TABS: 125.0000 ug | ORAL_TABLET | Freq: Every day | ORAL | Status: DC

## 2013-11-05 MED ORDER — ONDANSETRON HCL 4 MG PO TABS: 4.0000 mg | ORAL_TABLET | Freq: Four times a day (QID) | ORAL | Status: DC | PRN

## 2013-11-05 MED ORDER — ONDANSETRON HCL 4 MG/2ML IJ SOLN
INTRAMUSCULAR | Status: DC | PRN
  Administered 2013-11-05: 4 mg via INTRAVENOUS

## 2013-11-05 MED ORDER — OXYCODONE-ACETAMINOPHEN 5-325 MG PO TABS
1.0000 | ORAL_TABLET | ORAL | Status: DC | PRN
  Administered 2013-11-05 – 2013-11-06 (×3): 2 via ORAL
  Filled 2013-11-05 (×3): qty 2

## 2013-11-05 MED ORDER — PROPOFOL 10 MG/ML IV BOLUS
INTRAVENOUS | Status: DC | PRN
  Administered 2013-11-05: 130 mg via INTRAVENOUS

## 2013-11-05 MED ORDER — FENTANYL CITRATE 0.05 MG/ML IJ SOLN
INTRAMUSCULAR | Status: AC
  Filled 2013-11-05: qty 2

## 2013-11-05 MED ORDER — OXYCODONE-ACETAMINOPHEN 5-325 MG PO TABS: 1.0000 | ORAL_TABLET | ORAL | Status: DC | PRN

## 2013-11-05 MED ORDER — CIPROFLOXACIN IN D5W 400 MG/200ML IV SOLN
400.0000 mg | INTRAVENOUS | Status: AC
  Administered 2013-11-05: 400 mg via INTRAVENOUS

## 2013-11-05 MED ORDER — LIDOCAINE HCL (CARDIAC) 20 MG/ML IV SOLN
INTRAVENOUS | Status: DC | PRN
  Administered 2013-11-05: 80 mg via INTRAVENOUS

## 2013-11-05 MED ORDER — CIPROFLOXACIN IN D5W 400 MG/200ML IV SOLN: 400.0000 mg | INTRAVENOUS | Status: DC

## 2013-11-05 MED ORDER — EPHEDRINE SULFATE 50 MG/ML IJ SOLN
INTRAMUSCULAR | Status: DC | PRN
  Administered 2013-11-05: 10 mg via INTRAVENOUS

## 2013-11-05 MED ORDER — CHLORHEXIDINE GLUCONATE 4 % EX LIQD: 1.0000 "application " | Freq: Once | CUTANEOUS | Status: DC

## 2013-11-05 MED ORDER — METHYLENE BLUE 1 % INJ SOLN
INTRAMUSCULAR | Status: AC
  Filled 2013-11-05: qty 10

## 2013-11-05 MED ORDER — FENTANYL CITRATE 0.05 MG/ML IJ SOLN
INTRAMUSCULAR | Status: AC
  Filled 2013-11-05: qty 4

## 2013-11-05 MED ORDER — FENTANYL CITRATE 0.05 MG/ML IJ SOLN: 50.0000 ug | INTRAMUSCULAR | Status: DC | PRN

## 2013-11-05 MED ORDER — ONDANSETRON HCL 4 MG/2ML IJ SOLN: 4.0000 mg | Freq: Four times a day (QID) | INTRAMUSCULAR | Status: DC | PRN

## 2013-11-05 MED ORDER — HEPARIN SODIUM (PORCINE) 5000 UNIT/ML IJ SOLN
5000.0000 [IU] | Freq: Three times a day (TID) | INTRAMUSCULAR | Status: DC
  Administered 2013-11-05 – 2013-11-06 (×2): 5000 [IU] via SUBCUTANEOUS

## 2013-11-05 MED ORDER — LACTATED RINGERS IV SOLN
INTRAVENOUS | Status: DC
  Administered 2013-11-05: 50 mL/h via INTRAVENOUS

## 2013-11-05 MED ORDER — SODIUM CHLORIDE 0.9 % IJ SOLN
INTRAMUSCULAR | Status: AC
  Filled 2013-11-05: qty 10

## 2013-11-05 MED ORDER — OXYCODONE HCL 5 MG PO TABS: 5.0000 mg | ORAL_TABLET | Freq: Once | ORAL | Status: DC | PRN

## 2013-11-05 MED ORDER — MIDAZOLAM HCL 2 MG/2ML IJ SOLN: 1.0000 mg | INTRAMUSCULAR | Status: DC | PRN

## 2013-11-05 MED ORDER — DEXAMETHASONE SODIUM PHOSPHATE 4 MG/ML IJ SOLN
INTRAMUSCULAR | Status: DC | PRN
  Administered 2013-11-05: 5 mg via INTRAVENOUS

## 2013-11-05 MED ORDER — MIDAZOLAM HCL 2 MG/2ML IJ SOLN
INTRAMUSCULAR | Status: AC
  Filled 2013-11-05: qty 2

## 2013-11-05 MED ORDER — BUPIVACAINE-EPINEPHRINE PF 0.5-1:200000 % IJ SOLN
INTRAMUSCULAR | Status: DC | PRN
  Administered 2013-11-05: 13 mL

## 2013-11-05 MED ORDER — BUPIVACAINE-EPINEPHRINE PF 0.5-1:200000 % IJ SOLN
INTRAMUSCULAR | Status: AC
  Filled 2013-11-05: qty 30

## 2013-11-05 MED ORDER — FENTANYL CITRATE 0.05 MG/ML IJ SOLN
25.0000 ug | INTRAMUSCULAR | Status: DC | PRN
  Administered 2013-11-05 (×3): 25 ug via INTRAVENOUS

## 2013-11-05 MED ORDER — BENAZEPRIL HCL 10 MG PO TABS: 10.0000 mg | ORAL_TABLET | Freq: Every day | ORAL | Status: DC

## 2013-11-05 MED ORDER — LACTATED RINGERS IV SOLN
INTRAVENOUS | Status: DC
  Administered 2013-11-05 (×2): via INTRAVENOUS

## 2013-11-05 MED ORDER — CIPROFLOXACIN IN D5W 400 MG/200ML IV SOLN
INTRAVENOUS | Status: AC
  Filled 2013-11-05: qty 200

## 2013-11-05 SURGICAL SUPPLY — 63 items
ADH SKN CLS APL DERMABOND .7 (GAUZE/BANDAGES/DRESSINGS) ×1
APPLIER CLIP 9.375 MED OPEN (MISCELLANEOUS) ×2
APR CLP MED 9.3 20 MLT OPN (MISCELLANEOUS) ×1
BINDER BREAST XLRG (GAUZE/BANDAGES/DRESSINGS) ×1 IMPLANT
BIOPATCH RED 1 DISK 7.0 (GAUZE/BANDAGES/DRESSINGS) ×2 IMPLANT
BLADE SURG 10 STRL SS (BLADE) ×1 IMPLANT
BLADE SURG 15 STRL LF DISP TIS (BLADE) ×1 IMPLANT
BLADE SURG 15 STRL SS (BLADE) ×2
CANISTER SUCT 1200ML W/VALVE (MISCELLANEOUS) ×1 IMPLANT
CHLORAPREP W/TINT 26ML (MISCELLANEOUS) ×2 IMPLANT
CLIP APPLIE 9.375 MED OPEN (MISCELLANEOUS) IMPLANT
CLIP TI MEDIUM 6 (CLIP) IMPLANT
CLIP TI WIDE RED SMALL 6 (CLIP) IMPLANT
COVER MAYO STAND STRL (DRAPES) ×2 IMPLANT
COVER TABLE BACK 60X90 (DRAPES) ×2 IMPLANT
DERMABOND ADVANCED (GAUZE/BANDAGES/DRESSINGS) ×1
DERMABOND ADVANCED .7 DNX12 (GAUZE/BANDAGES/DRESSINGS) IMPLANT
DEVICE DUBIN W/COMP PLATE 8390 (MISCELLANEOUS) ×1 IMPLANT
DRAIN CHANNEL 19F RND (DRAIN) ×1 IMPLANT
DRAPE LAPAROSCOPIC ABDOMINAL (DRAPES) ×1 IMPLANT
DRAPE PED LAPAROTOMY (DRAPES) ×1 IMPLANT
DRAPE UTILITY XL STRL (DRAPES) ×2 IMPLANT
DRSG TEGADERM 2-3/8X2-3/4 SM (GAUZE/BANDAGES/DRESSINGS) ×1 IMPLANT
ELECT COATED BLADE 2.86 ST (ELECTRODE) ×2 IMPLANT
ELECT REM PT RETURN 9FT ADLT (ELECTROSURGICAL) ×2
ELECTRODE REM PT RTRN 9FT ADLT (ELECTROSURGICAL) ×1 IMPLANT
EVACUATOR SILICONE 100CC (DRAIN) ×1 IMPLANT
GLOVE BIO SURGEON STRL SZ 6 (GLOVE) ×1 IMPLANT
GLOVE BIO SURGEON STRL SZ7 (GLOVE) ×1 IMPLANT
GLOVE BIOGEL PI IND STRL 7.0 (GLOVE) IMPLANT
GLOVE BIOGEL PI IND STRL 8 (GLOVE) ×1 IMPLANT
GLOVE BIOGEL PI INDICATOR 7.0 (GLOVE) ×2
GLOVE BIOGEL PI INDICATOR 8 (GLOVE) ×1
GLOVE SS BIOGEL STRL SZ 7.5 (GLOVE) ×1 IMPLANT
GLOVE SUPERSENSE BIOGEL SZ 7.5 (GLOVE) ×1
GOWN STRL REUS W/ TWL LRG LVL3 (GOWN DISPOSABLE) ×1 IMPLANT
GOWN STRL REUS W/ TWL XL LVL3 (GOWN DISPOSABLE) ×1 IMPLANT
GOWN STRL REUS W/TWL LRG LVL3 (GOWN DISPOSABLE) ×6
GOWN STRL REUS W/TWL XL LVL3 (GOWN DISPOSABLE) ×2
KIT MARKER MARGIN INK (KITS) IMPLANT
NDL HYPO 25X1 1.5 SAFETY (NEEDLE) ×1 IMPLANT
NEEDLE HYPO 25X1 1.5 SAFETY (NEEDLE) ×2 IMPLANT
NS IRRIG 1000ML POUR BTL (IV SOLUTION) ×2 IMPLANT
PACK BASIN DAY SURGERY FS (CUSTOM PROCEDURE TRAY) ×2 IMPLANT
PENCIL BUTTON HOLSTER BLD 10FT (ELECTRODE) ×2 IMPLANT
PIN SAFETY STERILE (MISCELLANEOUS) ×1 IMPLANT
SLEEVE SCD COMPRESS KNEE MED (MISCELLANEOUS) ×1 IMPLANT
STAPLER VISISTAT 35W (STAPLE) ×1 IMPLANT
SUT ETHILON 3 0 PS 1 (SUTURE) ×1 IMPLANT
SUT MON AB 5-0 PS2 18 (SUTURE) ×2 IMPLANT
SUT SILK 3 0 SH 30 (SUTURE) IMPLANT
SUT VIC AB 3-0 54X BRD REEL (SUTURE) IMPLANT
SUT VIC AB 3-0 BRD 54 (SUTURE) ×2
SUT VIC AB 3-0 SH 27 (SUTURE)
SUT VIC AB 3-0 SH 27X BRD (SUTURE) ×1 IMPLANT
SUT VIC AB 4-0 BRD 54 (SUTURE) IMPLANT
SUT VICRYL 3-0 CR8 SH (SUTURE) ×1 IMPLANT
SYR BULB 3OZ (MISCELLANEOUS) IMPLANT
SYR CONTROL 10ML LL (SYRINGE) ×2 IMPLANT
TOWEL OR 17X24 6PK STRL BLUE (TOWEL DISPOSABLE) ×4 IMPLANT
TOWEL OR NON WOVEN STRL DISP B (DISPOSABLE) ×2 IMPLANT
TUBE CONNECTING 20X1/4 (TUBING) ×1 IMPLANT
YANKAUER SUCT BULB TIP NO VENT (SUCTIONS) ×1 IMPLANT

## 2013-11-05 NOTE — H&P (View-Only) (Signed)
Chief Complaint: New diagnosis of breast cancer  History:    Kim [name] is a 79 y.o. postmenopausal female referred by Dr. Margarette Canada  for evaluation of recently diagnosed carcinoma of the left breast. She recently presented for a screening mamogram 6 months ago revealing a small area of calcifications in the upper outer quadrant left breast. Short-term follow up was recommended in 6 months and this was just performed showing a slight increase in the number of calcifications..     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 She is seen now in office for initial treatment planning.  She has experienced no breast symptoms, specifically lump or skin changes or nipple discharge..  She does not have a personal history of any previous breast problems.  Bilateral breast MR was performed today showing just a biopsy site hematoma at the area of the clip with no enhancing masses in either breast. However a slightly enlarged morphologically abnormal lymph node was noted in the left axilla.  Findings at that time were the following:  Tumor size: 1 cm  Tumor grade: 3  Estrogen Receptor: positive Progesterone Receptor: positive  Her-2 neu: not performed  Lymph node status: abnormal left axillary lymph node noted on MRI of questionable significance insetting of DCIS   Past Medical History  Diagnosis Date  . Allergy   . Allergic rhinitis   . Hypothyroidism   . Urinary tract infection   . Diverticulosis   . Hx of adenomatous colonic polyps 02/1999  . Tubulovillous adenoma of colon 12/2009    with HGD  . Low back pain     gets ESI from Dr. Rennis Harding  . Arthritis   . Hypertension     Past Surgical History  Procedure Laterality Date  . Abdominal hysterectomy    . Bilateral salpingoophorectomy      see Laurin Coder NP for GYN exams  . Cataract extraction w/ intraocular lens  implant, bilateral  2012    bilateral    Current  Outpatient Prescriptions  Medication Sig Dispense Refill  . acetaminophen (TYLENOL) 650 MG CR tablet Take 650 mg by mouth every 8 (eight) hours as needed.      . Calcium Citrate-Vitamin D (CITRACAL MAXIMUM PO) Take by mouth.        . cetirizine (ZYRTEC) 10 MG tablet Take 10 mg by mouth daily as needed for allergies.      . ciprofloxacin (CIPRO) 500 MG tablet Take 1 tablet (500 mg total) by mouth 2 (two) times daily.  14 tablet  0  . levothyroxine (SYNTHROID, LEVOTHROID) 125 MCG tablet TAKE 1 TABLET BY MOUTH ONCE A DAY  30 tablet  4  . levothyroxine (SYNTHROID, LEVOTHROID) 125 MCG tablet TAKE 1 TABLET BY MOUTH ONCE A DAY  30 tablet  11  . Multiple Vitamin (MULTIVITAMIN) tablet Take 1 tablet by mouth daily.        . benazepril (LOTENSIN) 10 MG tablet Take 1 tablet (10 mg total) by mouth daily.  30 tablet  10  . [DISCONTINUED] ferrous sulfate 325 (65 FE) MG tablet Take 325 mg by mouth 2 (two) times daily.         No current facility-administered medications for this visit.    Family History  Problem Relation Age of Onset  . Alcohol abuse Father   . Kidney failure Mother   . Colon cancer      family    History  Social History  . Marital Status: Married    Spouse Name: N/A    Number of Children: 2  . Years of Education: N/A   Occupational History  . Retired-school system    Social History Main Topics  . Smoking status: Never Smoker   . Smokeless tobacco: Never Used  . Alcohol Use: No  . Drug Use: No  . Sexual Activity: None   Other Topics Concern  . None   Social History Narrative  . None     Review of Systems A comprehensive review of systems was negative.     Objective:  BP 128/68  Pulse 78  Temp(Src) 97.9 F (36.6 C) (Temporal)  Resp 16  Ht $R'5\' 8"'qJ$  (1.727 m)  Wt 178 lb 3.2 oz (80.831 kg)  BMI 27.10 kg/m2  General: Alert, well-developed Caucasian female, in no distress Skin: Warm and dry without rash or infection. HEENT: No palpable masses or thyromegaly.  Sclera nonicteric. Pupils equal round and reactive. Oropharynx clear. Breasts: some bruising and thickening in the upper outer left breast post biopsy. No other palpable abnormalities or skin changes or nipple discharge. Lymph nodes: No cervical, supraclavicular, or inguinal nodes palpable. Lungs: Breath sounds clear and equal without increased work of breathing Cardiovascular: Regular rate and rhythm without murmur. No JVD or edema. Peripheral pulses intact. Abdomen: Nondistended. Soft and nontender. No masses palpable. No organomegaly. No palpable hernias. Extremities: No edema or joint swelling or deformity. No chronic venous stasis changes. Neurologic: Alert and fully oriented. Gait normal.   Laboratory data:  CBC:  Lab Results  Component Value Date   WBC 5.0 05/18/2012   RBC 4.48 05/18/2012   HGB 13.8 05/18/2012   HCT 41.1 05/18/2012   PLT 124.0* 05/18/2012  ]  [facility] Labs:  Lab Results  Component Value Date   NA 138 05/18/2012   K 4.3 05/18/2012   CL 102 05/18/2012   CO2 29 05/18/2012   BUN 19 05/18/2012   CREATININE 0.9 05/18/2012   CALCIUM 9.6 05/18/2012   PROT 7.2 05/18/2012   BILITOT 0.8 05/18/2012   BILIDIR 0.1 05/18/2012   ALKPHOS 68 05/18/2012   AST 11 05/18/2012   ALT 11 05/18/2012     Assessment  79 y.o. female with a new diagnosis of cancer of the the left breast upper outer quadrant.  Clinical 0, estrogen receptor positive. I discussed with the patient and family members present today initial surgical treatment options. We discussed options of breast conservation with lumpectomy or total mastectomy and sentinal lymph node biopsy/dissection. Options for reconstruction were discussed. After discussion they have elected to proceed with left partial mastectomy.  We discussed the indications and nature of the procedure, and expected recovery, in detail. Surgical risks including anesthetic complications, cardiorespiratory complications, bleeding, infection, wound healing complications,  blood clots, lymphedema, local and distant recurrence and possible need for further surgery based on the final pathology was discussed and understood.  Chemotherapy, hormonal therapy and radiation therapy have been discussed. They have been provided with literature regarding the treatment of breast cancer.  All questions were answered. They understand and agree to proceed and we will go ahead with scheduling. We will go ahead and schedule her for evaluation of her left axilla with ultrasound and possible biopsy.  Plan left partial mastectomy with needle localization under general anesthesia as an outpatient. We will need to obtain the results of her axillary ultrasound and possible biopsy prior to surgery.  Edward Jolly MD, FACS  10/26/2013, 2:54 PM

## 2013-11-05 NOTE — Discharge Instructions (Addendum)
Per Dr. Excell Seltzer -Sponge bath until drain is removed (no bath or shower) -Keep dressing clean and dry -Do not change dressing -Follow up with Dr. Excell Seltzer in his office as scheduled on 11/14/13 at 0850 am.   Post Anesthesia Home Care Instructions  Activity: Get plenty of rest for the remainder of the day. A responsible adult should stay with you for 24 hours following the procedure.  For the next 24 hours, DO NOT: -Drive a car -Paediatric nurse -Drink alcoholic beverages -Take any medication unless instructed by your physician -Make any legal decisions or sign important papers.  Meals: Start with liquid foods such as gelatin or soup. Progress to regular foods as tolerated. Avoid greasy, spicy, heavy foods. If nausea and/or vomiting occur, drink only clear liquids until the nausea and/or vomiting subsides. Call your physician if vomiting continues.  Special Instructions/Symptoms: Your throat may feel dry or sore from the anesthesia or the breathing tube placed in your throat during surgery. If this causes discomfort, gargle with warm salt water. The discomfort should disappear within 24 hours.  Call your surgeon if you experience:   1.  Fever over 101.0. 2.  Inability to urinate. 3.  Nausea and/or vomiting. 4.  Extreme swelling or bruising at the surgical site. 5.  Continued bleeding from the incision. 6.  Increased pain, redness or drainage from the incision. 7.  Problems related to your pain medication.   About my Jackson-Pratt Bulb Drain  What is a Jackson-Pratt bulb? A Jackson-Pratt is a soft, round device used to collect drainage. It is connected to a long, thin drainage catheter, which is held in place by one or two small stiches near your surgical incision site. When the bulb is squeezed, it forms a vacuum, forcing the drainage to empty into the bulb.  Emptying the Jackson-Pratt bulb- To empty the bulb: 1. Release the plug on the top of the bulb. 2. Pour the bulb's  contents into a measuring container which your nurse will provide. 3. Record the time emptied and amount of drainage. Empty the drain(s) as often as your     doctor or nurse recommends.  Date                  Time                    Amount (Drain 1)                 Amount (Drain 2)  _____________________________________________________________________  _____________________________________________________________________  _____________________________________________________________________  _____________________________________________________________________  _____________________________________________________________________  _____________________________________________________________________  _____________________________________________________________________  _____________________________________________________________________  Squeezing the Jackson-Pratt Bulb- To squeeze the bulb: 1. Make sure the plug at the top of the bulb is open. 2. Squeeze the bulb tightly in your fist. You will hear air squeezing from the bulb. 3. Replace the plug while the bulb is squeezed. 4. Use a safety pin to attach the bulb to your clothing. This will keep the catheter from     pulling at the bulb insertion site.  When to call your doctor- Call your doctor if:  Drain site becomes red, swollen or hot.  You have a fever greater than 101 degrees F.  There is oozing at the drain site.  Drain falls out (apply a guaze bandage over the drain hole and secure it with tape).  Drainage increases daily not related to activity patterns. (You will usually have more drainage when you are active than when you are resting.)  Drainage has a bad  odor.

## 2013-11-05 NOTE — Interval H&P Note (Signed)
History and Physical Interval Note:  11/05/2013 4:10 PM As noted above the patient has undergone left axillary ultrasound and biopsy which has unfortunately revealed metastatic mammary carcinoma in the lymph node. There are no other abnormalities in the breast other than the area of known biopsied DCIS. I recommended proceeding with axillary dissection at the time of her lumpectomy. This has been discussed with her including indications and risks of bleeding and infection and lymphedema and nerve and vascular injury. She understands and agrees to proceed. Kim [name]  has presented today for surgery, with the diagnosis of left breast cancer  The various methods of treatment have been discussed with the patient and family. After consideration of risks, benefits and other options for treatment, the patient has consented to  Procedure(s): LEFT BREAST LUMPECTOMY WITH NEEDLE LOCALIZATION and axillary lymph Node Dissection (Left) as a surgical intervention .  The patient's history has been reviewed, patient examined, no change in status, stable for surgery.  I have reviewed the patient's chart and labs.  Questions were answered to the patient's satisfaction.     Cecil Bixby T

## 2013-11-05 NOTE — Transfer of Care (Signed)
Immediate Anesthesia Transfer of Care Note  Patient: Kim [name]  Procedure(s) Performed: Procedure(s): LEFT BREAST LUMPECTOMY WITH NEEDLE LOCALIZATION and axillary lymph Node Dissection (Left)  Patient Location: PACU  Anesthesia Type:General  Level of Consciousness: awake, alert  and oriented  Airway & Oxygen Therapy: Patient Spontanous Breathing and Patient connected to face mask oxygen  Post-op Assessment: Report given to PACU RN and Post -op Vital signs reviewed and stable  Post vital signs: Reviewed and stable  Complications: No apparent anesthesia complications

## 2013-11-05 NOTE — Op Note (Signed)
Preoperative Diagnosis: left breast cancer  Postoprative Diagnosis: left breast cancer  Procedure: Procedure(s): LEFT BREAST LUMPECTOMY WITH NEEDLE LOCALIZATION and axillary lymph Node Dissection   Surgeon: Excell Seltzer T   Assistants: none  Anesthesia:  General LMA anesthesia  Indications: patient is a 79 year old female with a recent abnormal mammogram revealing calcifications in the upper-outer left breast. 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 MRI showed a limited area of disease in the breast was really just postbiopsy hematoma however there was an enlarged axillary lymph node and large needle biopsy has revealed metastatic mammary carcinoma. There are no other abnormalities in the breast on mammogram or MRI or physical exam. I recommend proceeding with needle localized lumpectomy and left axillary dissection is initial surgical therapy. We discussed the procedure and its indications and risks extensively detailed elsewhere.  Procedure Detail:  Following accurate needle localization the patient is brought to the operating room, placed in the supine position on the operating table, and laryngeal mask general anesthesia induced. The entire breast and axilla and upper arm were widely sterilely prepped and draped. She received preoperative IV antibiotics. Patient Timeout was performed and correct procedure verified. Polypectomy was approached initially. A curvilinear incision was made near the wire insertion site and dissection carried down through the subcutaneous tissue to the breast capsule. The wire was brought into the incision. The breast capsule was approached and excised a generous specimen of tissue around the shaft and the tip of the wire. There was a central palpable abnormality but this could have been postbiopsy change. The specimen was inked for margins and specimen radiograph obtained showing the clip and the wire centrally located in the specimen. The  inferior edge appeared somewhat closer than the other margins I did take a small further inferior excision from the lumpectomy cavity which was oriented and sent as a separate specimen. There were no other palpable abnormalities. The lumpectomy cavity was marked with clips. The soft tissue was infiltrated with Marcaine. Hemostasis was assured. The deep and subcutaneous tissue were closed with interrupted 3-0 Vicryl. Attention was turned to the axilla. A transverse incision was made in the axilla and dissection carried down to the subcutaneous tissue using cautery. Dissection was deepened down anteriorly to the lateral border of the pectoralis major which was defined and posteriorly to the anterior border the latissimus dorsi which was dissected free. The axillary contents were dissected away from the tail of Spence the breast using cautery. The pectoralis major was retracted medially and the clavipectoral fascia identified and incised and the axilla entered. The pectoralis minor was retracted medially. The axillary vein was identified and was carefully protected. Beginning under the pectoralis minor all fibrofatty tissue inferior to the axillary vein was swept inferiorly and laterally. Perforating vessels from the axillary artery and vein were controlled with clips. As the dissection progressed laterally I identified the the thoracodorsal nerve and vessels which were carefully protected. Medially the long thoracic nerve was identified and protected. All tissue between these 2 structures was swept inferiorly. Attachments up toward the axilla along the anterior border of the latissimus were clamped and tied and divided and the dissection continued inferiorly until finally the final attachments along the serratus and breast were divided with cautery and the specimen was removed. The nerves were identified and intact. There was no significant bleeding. The wound was thoroughly irrigated. A 19 Blake closed suction drain  was left in the wound brought out through a separate stab wound. The subcutaneous tissues  closed with running 3-0 Vicryl and the skin closed with staples. The breast wound skin was closed with subcuticular Monocryl and Dermabond. Sponge needle and his counts were correct.   Estimated Blood Loss:  Minimal         Drains: 19 Blake drain in axilla  Blood Given: none          Specimens: #1 left breast lumpectomy       #2 further inferior margin left breast lumpectomy     #3 left axillary contents        Complications:  * No complications entered in OR log *         Disposition: PACU - hemodynamically stable.         Condition: stable

## 2013-11-05 NOTE — Anesthesia Preprocedure Evaluation (Signed)
Anesthesia Evaluation  Patient identified by MRN, date of birth, ID band Patient awake    Reviewed: Allergy & Precautions, H&P , NPO status , Patient's Chart, lab work & pertinent test results  Airway Mallampati: II  Neck ROM: full    Dental   Pulmonary neg pulmonary ROS,          Cardiovascular hypertension,     Neuro/Psych    GI/Hepatic   Endo/Other  Hypothyroidism   Renal/GU      Musculoskeletal   Abdominal   Peds  Hematology   Anesthesia Other Findings   Reproductive/Obstetrics                           Anesthesia Physical Anesthesia Plan  ASA: II  Anesthesia Plan: General   Post-op Pain Management:    Induction: Intravenous  Airway Management Planned: LMA  Additional Equipment:   Intra-op Plan:   Post-operative Plan:   Informed Consent: I have reviewed the patients History and Physical, chart, labs and discussed the procedure including the risks, benefits and alternatives for the proposed anesthesia with the patient or authorized representative who has indicated his/her understanding and acceptance.     Plan Discussed with: CRNA, Anesthesiologist and Surgeon  Anesthesia Plan Comments:         Anesthesia Quick Evaluation

## 2013-11-05 NOTE — Anesthesia Procedure Notes (Signed)
Procedure Name: LMA Insertion Date/Time: 11/05/2013 4:30 PM Performed by: Maryella Shivers Pre-anesthesia Checklist: Patient identified, Emergency Drugs available, Suction available and Patient being monitored Patient Re-evaluated:Patient Re-evaluated prior to inductionOxygen Delivery Method: Circle System Utilized Preoxygenation: Pre-oxygenation with 100% oxygen Intubation Type: IV induction Ventilation: Mask ventilation without difficulty LMA: LMA inserted LMA Size: 4.0 Number of attempts: 1 Airway Equipment and Method: bite block Placement Confirmation: positive ETCO2 Tube secured with: Tape Dental Injury: Teeth and Oropharynx as per pre-operative assessment

## 2013-11-06 NOTE — Anesthesia Postprocedure Evaluation (Signed)
Anesthesia Post Note  Patient: Kim [name]  Procedure(s) Performed: Procedure(s) (LRB): LEFT BREAST LUMPECTOMY WITH NEEDLE LOCALIZATION and axillary lymph Node Dissection (Left)  Anesthesia type: general  Patient location: PACU  Post pain: Pain level controlled  Post assessment: Patient's Cardiovascular Status Stable  Post vital signs: Reviewed and stable  Level of consciousness: sedated  Complications: No apparent anesthesia complications

## 2013-11-07 ENCOUNTER — Encounter (HOSPITAL_BASED_OUTPATIENT_CLINIC_OR_DEPARTMENT_OTHER): Payer: Self-pay | Admitting: General Surgery

## 2013-11-08 ENCOUNTER — Telehealth (INDEPENDENT_AMBULATORY_CARE_PROVIDER_SITE_OTHER): Payer: Self-pay | Admitting: General Surgery

## 2013-11-08 DIAGNOSIS — C50412 Malignant neoplasm of upper-outer quadrant of left female breast: Secondary | ICD-10-CM

## 2013-11-08 HISTORY — DX: Malignant neoplasm of upper-outer quadrant of left female breast: C50.412

## 2013-11-08 NOTE — Telephone Encounter (Signed)
Called, patient resting, will call back later

## 2013-11-08 NOTE — Telephone Encounter (Signed)
Pt returned call to Dr Excell Seltzer.  Explained his is unavailable this afternoon, but would let him know she had called.

## 2013-11-14 ENCOUNTER — Ambulatory Visit (INDEPENDENT_AMBULATORY_CARE_PROVIDER_SITE_OTHER): Payer: Medicare Other | Admitting: General Surgery

## 2013-11-14 ENCOUNTER — Encounter (INDEPENDENT_AMBULATORY_CARE_PROVIDER_SITE_OTHER): Payer: Self-pay | Admitting: General Surgery

## 2013-11-14 VITALS — BP 150/80 | HR 68 | Temp 97.5°F | Resp 14 | Ht 68.0 in | Wt 181.6 lb

## 2013-11-14 DIAGNOSIS — Z09 Encounter for follow-up examination after completed treatment for conditions other than malignant neoplasm: Secondary | ICD-10-CM

## 2013-11-14 NOTE — Patient Instructions (Signed)
No activity limitations. Begin exercises for your arm as per the instruction sheet He will receive a call from the cancer center regarding medical and radiation oncology appointments.

## 2013-11-14 NOTE — Progress Notes (Signed)
History: Patient returns following left breast lumpectomy and axillary dissection for left breast cancer metastatic to lymph nodes or preop biopsy. She has done well from surgery. The drain  "came out" a day or 2 ago. She denies discomfort or other complaints..  Exam: BP 150/80  Pulse 68  Temp(Src) 97.5 F (36.4 C) (Oral)  Resp 14  Ht 5\' 8"  (1.727 m)  Wt 181 lb 9.6 oz (82.373 kg)  BMI 27.62 kg/m2 General: Appears well Wounds: Breath lobectomy wouldn't actually weren't are healing well without seroma or infection or other complications. Staples are removed from the axilla.  I reviewed the path report again with the patient and her family. Lumpectomy margins were clear and revealed only ductal carcinoma in situ. She however had 4/10 axillary lymph nodes involved with metastatic mammary carcinoma. She obviously has an occult cancer in the left breast. She will need radiation therapy and some sort of systemic therapy. I do not believe any further surgery is indicated. We will arrange appointments at the [facility] for medical and radiation oncology. She was given instructions regarding exercises for her arm. She is to return in one month.

## 2013-11-15 ENCOUNTER — Other Ambulatory Visit: Payer: Self-pay | Admitting: *Deleted

## 2013-11-15 ENCOUNTER — Other Ambulatory Visit (INDEPENDENT_AMBULATORY_CARE_PROVIDER_SITE_OTHER): Payer: Self-pay

## 2013-11-15 ENCOUNTER — Telehealth: Payer: Self-pay | Admitting: *Deleted

## 2013-11-15 ENCOUNTER — Encounter: Payer: Self-pay | Admitting: *Deleted

## 2013-11-15 DIAGNOSIS — D0590 Unspecified type of carcinoma in situ of unspecified breast: Secondary | ICD-10-CM

## 2013-11-15 DIAGNOSIS — C50419 Malignant neoplasm of upper-outer quadrant of unspecified female breast: Secondary | ICD-10-CM

## 2013-11-15 NOTE — Telephone Encounter (Signed)
Received appt time and date from Dr. Humphrey Rolls.  Called pt and confirmed 11/16/13 appt w/ her.  Unable to mail before appt letter - gave verbal.  Unable to mail welcome packet - gave directions to make sure she knew where to come.  Unable to give Intake form - placed a note for one to be given at time of check in.  Emailed Christy at [place] to make her aware of appt.  Emailed Santiago Glad in [place] to make her aware and let her know that pt is expecting her call.  Made chart and gave to [facility] to enter labs.

## 2013-11-15 NOTE — Progress Notes (Signed)
Received referral in [facility].  Took paperwork to Dr. Jana Hakim for an appt time and date.  Emailed Christy at [employer] to make her aware.  Emailed Santiago Glad in [place] to make her aware.

## 2013-11-16 ENCOUNTER — Encounter: Payer: Self-pay | Admitting: *Deleted

## 2013-11-16 ENCOUNTER — Ambulatory Visit (HOSPITAL_BASED_OUTPATIENT_CLINIC_OR_DEPARTMENT_OTHER): Payer: Medicare Other | Admitting: Oncology

## 2013-11-16 ENCOUNTER — Encounter: Payer: Self-pay | Admitting: Oncology

## 2013-11-16 ENCOUNTER — Ambulatory Visit: Payer: Medicare Other

## 2013-11-16 ENCOUNTER — Other Ambulatory Visit (HOSPITAL_BASED_OUTPATIENT_CLINIC_OR_DEPARTMENT_OTHER): Payer: Medicare Other

## 2013-11-16 VITALS — BP 158/81 | HR 78 | Temp 97.9°F | Resp 18 | Ht 67.5 in | Wt 177.2 lb

## 2013-11-16 DIAGNOSIS — C773 Secondary and unspecified malignant neoplasm of axilla and upper limb lymph nodes: Secondary | ICD-10-CM

## 2013-11-16 DIAGNOSIS — C50419 Malignant neoplasm of upper-outer quadrant of unspecified female breast: Secondary | ICD-10-CM

## 2013-11-16 DIAGNOSIS — C50412 Malignant neoplasm of upper-outer quadrant of left female breast: Secondary | ICD-10-CM

## 2013-11-16 DIAGNOSIS — D0510 Intraductal carcinoma in situ of unspecified breast: Secondary | ICD-10-CM

## 2013-11-16 DIAGNOSIS — Z170 Estrogen receptor positive status [ER+]: Secondary | ICD-10-CM

## 2013-11-16 LAB — CBC WITH DIFFERENTIAL/PLATELET
BASO%: 0.9 % (ref 0.0–2.0)
Basophils Absolute: 0.1 10*3/uL (ref 0.0–0.1)
EOS%: 1.9 % (ref 0.0–7.0)
Eosinophils Absolute: 0.1 10*3/uL (ref 0.0–0.5)
HCT: 37 % (ref 34.8–46.6)
HGB: 12.2 g/dL (ref 11.6–15.9)
LYMPH%: 26.9 % (ref 14.0–49.7)
MCH: 29.3 pg (ref 25.1–34.0)
MCHC: 33 g/dL (ref 31.5–36.0)
MCV: 88.9 fL (ref 79.5–101.0)
MONO#: 0.6 10*3/uL (ref 0.1–0.9)
MONO%: 8.6 % (ref 0.0–14.0)
NEUT#: 4.1 10*3/uL (ref 1.5–6.5)
NEUT%: 61.7 % (ref 38.4–76.8)
Platelets: 161 10*3/uL (ref 145–400)
RBC: 4.16 10*6/uL (ref 3.70–5.45)
RDW: 13.6 % (ref 11.2–14.5)
WBC: 6.6 10*3/uL (ref 3.9–10.3)
lymph#: 1.8 10*3/uL (ref 0.9–3.3)

## 2013-11-16 LAB — COMPREHENSIVE METABOLIC PANEL (CC13)
ALT: 21 U/L (ref 0–55)
AST: 17 U/L (ref 5–34)
Albumin: 3.8 g/dL (ref 3.5–5.0)
Alkaline Phosphatase: 59 U/L (ref 40–150)
Anion Gap: 8 mEq/L (ref 3–11)
BUN: 19.2 mg/dL (ref 7.0–26.0)
CO2: 24 mEq/L (ref 22–29)
Calcium: 9.5 mg/dL (ref 8.4–10.4)
Chloride: 108 mEq/L (ref 98–109)
Creatinine: 0.9 mg/dL (ref 0.6–1.1)
Glucose: 100 mg/dl (ref 70–140)
Potassium: 3.9 mEq/L (ref 3.5–5.1)
Sodium: 139 mEq/L (ref 136–145)
Total Bilirubin: 0.46 mg/dL (ref 0.20–1.20)
Total Protein: 6.5 g/dL (ref 6.4–8.3)

## 2013-11-16 NOTE — Progress Notes (Signed)
Completed chart and gave to Dawn to enter labs and return to me.  

## 2013-11-16 NOTE — Progress Notes (Signed)
Kim [name] [id number] [birth year] 79 y.o. 11/16/2013 4:09 PM  CC  Laurey Morale, MD [place] [address] Dr. 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 Excell Seltzer  HISTORY OF PRESENT ILLNESS:  Kim [name] is a 79 y.o. female.  Who presented for a screening mammogram 6 months ago that revealed a small area of calcifications in the upper-outer quadrant of the left breast. She was recommended a short term followup mammogram which was performed in January 2015. The mammogram revealed slight increase in the number of calcifications. Because of this she had a stereotactic biopsy performed on 10/18/2013. The pathology revealed a ductal carcinoma in situ. The tumor was ER positive PR positive. Measuring 1cm. Patient had MRI of the breasts performed which did reveal abnormal left axillary lymph nodes. Patient was seen by Dr. Excell Seltzer who recommended partial left mastectomy with sentinel lymph node biopsy/dissection. Patient went on to have lumpectomy with lymph node dissection performed on 11/05/2013. The final pathology revealed 1.5 cm ductal carcinoma in situ with intravascular carcinoma 4 of 10 lymph nodes were positive for metastatic disease. The tumor was ER  positive PR positive. HER-2/neu and Ki-67 were not performed. Patient is now seen in medical oncology with her husband and her daughter for discussion of treatment options adjuvantly. Postoperatively she is doing well. She is without any complaints related to her breast cancer. I personally have reviewed her pathology and radiology and I discussed it with them thoroughly.   Past Medical History: Past Medical History  Diagnosis Date  . Allergy   . Allergic rhinitis   . Hypothyroidism   . Urinary tract infection   . Diverticulosis   . Hx of adenomatous colonic polyps 02/1999  . Tubulovillous adenoma of colon 12/2009    with HGD  . Low back pain     gets ESI from Dr. Rennis Harding  . Arthritis   . Hypertension   . HOH (hard of hearing)   . Wears hearing aid     Past Surgical History: Past Surgical History  Procedure Laterality Date  . Abdominal hysterectomy    . Bilateral salpingoophorectomy      see Laurin Coder NP for GYN exams  . Cataract extraction w/ intraocular lens  implant, bilateral  2012    bilateral  . Colonoscopy    . Breast lumpectomy with needle localization and axillary lymph node dissection Left 11/05/2013    Procedure: LEFT BREAST LUMPECTOMY WITH NEEDLE LOCALIZATION and axillary lymph Node Dissection;  Surgeon: Edward Jolly, MD;  Location: [facility];  Service: General;  Laterality: Left;  . Breast surgery      Family History: Family History  Problem Relation Age of Onset  .  Alcohol abuse Father   . Kidney failure Mother   . Colon cancer      family    Social History History  Substance Use Topics  . Smoking status: Never Smoker   . Smokeless tobacco: Never Used  . Alcohol Use: No    Allergies: Allergies  Allergen Reactions  . Propoxyphene N-Acetaminophen Swelling    tongue swelling  . Penicillins   . Cortisone Rash  . Pseudoephedrine Hcl Er Rash and Other (See Comments)    Face gets red    Current Medications: Current  Outpatient Prescriptions  Medication Sig Dispense Refill  . acetaminophen (TYLENOL) 650 MG CR tablet Take 650 mg by mouth every 8 (eight) hours as needed.      . Calcium Citrate-Vitamin D (CITRACAL MAXIMUM PO) Take by mouth.        . levothyroxine (SYNTHROID, LEVOTHROID) 125 MCG tablet TAKE 1 TABLET BY MOUTH ONCE A DAY  30 tablet  4  . Multiple Vitamin (MULTIVITAMIN) tablet Take 1 tablet by mouth daily.        Marland Kitchen oxyCODONE-acetaminophen (PERCOCET/ROXICET) 5-325 MG per tablet       . benazepril (LOTENSIN) 10 MG tablet Take 1 tablet (10 mg total) by mouth daily.  30 tablet  10  . cetirizine (ZYRTEC) 10 MG tablet Take 10 mg by mouth daily as needed for allergies.      . [DISCONTINUED] ferrous sulfate 325 (65 FE) MG tablet Take 325 mg by mouth 2 (two) times daily.         No current facility-administered medications for this visit.    OB/GYN History: menarche at age 23, menopause at 84, age at first child 82, G36P2, HRT x 15 years  Fertility Discussion: N/A Prior History of Cancer: N/A  Health Maintenance:  Colonoscopy 2014 Bone Density yes 2014 Last PAP smear no more PAP  ECOG PERFORMANCE STATUS: 1 - Symptomatic but completely ambulatory  Genetic Counseling/testing: n/a  REVIEW OF SYSTEMS:  Comprehensive 14 point review of systems was obtained and it is again separately into the electronic medical record  PHYSICAL EXAMINATION: Blood pressure 158/81, pulse 78, temperature 97.9 F (36.6 C), temperature source Oral, resp. rate 18, height 5' 7.5" (1.715 m), weight 177 lb 3.2 oz (80.377 kg).  General:  well-nourished in no acute distress.  Eyes:  no scleral icterus.  ENT:  There were no oropharyngeal lesions.  Neck was without thyromegaly.  Lymphatics:  Negative cervical, supraclavicular or axillary adenopathy.  Respiratory: lungs were clear bilaterally without wheezing or crackles.  Cardiovascular:  Regular rate and rhythm, S1/S2, without murmur, rub or gallop.  There was no pedal edema.   GI:  abdomen was soft, flat, nontender, nondistended, without organomegaly.  Muscoloskeletal:  no spinal tenderness of palpation of vertebral spine.  Skin exam was without echymosis, petichae.  Neuro exam was nonfocal.  Patient was able to get on and off exam table without assistance.  Gait was normal.  Patient was alerted and oriented.  Attention was good.   Language was appropriate.  Mood was normal without depression.  Speech was not pressured.  Thought content was not tangential.   Breasts: right breast normal without mass, skin or nipple changes or axillary nodes, surgical scars noted in the left breast. There is no evidence of infection or nipple discharge.Marland Kitchen   STUDIES/RESULTS: Mr Breast Bilateral W Wo Contrast  10/26/2013   CLINICAL DATA:  79 year old female with recently diagnosed DCIS of the left breast post stereotactic biopsy of a cluster of  calcifications measuring up to 1.4 cm.  EXAM: BILATERAL BREAST MRI WITH AND WITHOUT CONTRAST  LABS:  BUN and creatinine were obtained on site at [facility] at  [address]  Results:  BUN 19 mg/dL,  Creatinine 0.9 mg/dL.  TECHNIQUE: Multiplanar, multisequence MR images of both breasts were obtained prior to and following the intravenous administration of 40ml of MultiHance.  THREE-DIMENSIONAL MR IMAGE RENDERING ON INDEPENDENT WORKSTATION:  Three-dimensional MR images were rendered by post-processing of the original MR data on an independent workstation. The three-dimensional MR images were interpreted, and findings are reported in the following complete MRI report for this study. Three dimensional images were evaluated at the independent DynaCad workstation  COMPARISON:  Previous exams  FINDINGS: Breast composition: b.  Scattered fibroglandular tissue.  Background parenchymal enhancement: Mild.  Right breast: No suspicious rapidly enhancing masses or abnormal areas of enhancement are seen in the right breast to suggest malignancy.  Left breast:  Rim enhancing post biopsy hematoma containing clip artifact is present in the upper-outer left breast. This measures 1.1 cm AP, 1.1 cm transverse, and up to 3.3 cm craniocaudal. No additional suspicious rapidly enhancing masses or abnormal areas of enhancement are seen in the left breast although patient motion mildly limits evaluation.  Lymph nodes: A rounded morphologically abnormal lymph node is present in the left axilla measuring up to 1.2 cm.  Ancillary findings: A splenule is incidentally seen along the lateral margin of the spleen.  IMPRESSION: 1. Biopsy proven DCIS in the upper-outer left breast, within associated rim enhancing hematoma measuring up to 3.3 cm craniocaudal. No definite additional sites of disease are seen in the left breast, although motion mildly limits evaluation.  2.  Enlarged morphologically abnormal lymph node in the left axilla.  3.  No MRI evidence of malignancy in the right breast.  RECOMMENDATION: 1. A second-look ultrasound (with possible biopsy) of the left axilla is recommended for further evaluation/characterization of the morphologically abnormal lymph node seen on MRI.  2.  Treatment plan for known left breast malignancy.  BI-RADS CATEGORY  6: Known biopsy-proven malignancy - appropriate action should be taken.   Electronically Signed   By: Everlean Alstrom M.D.   On: 10/26/2013 14:35   Mm Digital Diagnostic Unilat R  10/26/2013   CLINICAL DATA:  79 year old female with recently diagnosed DCIS of the left breast. Screening mammogram of the right breast.  EXAM: DIGITAL DIAGNOSTIC  RIGHT MAMMOGRAM WITH CAD  COMPARISON:  Previous exams.  ACR Breast Density Category b: There are scattered areas of fibroglandular density.  FINDINGS: No suspicious masses or calcifications are seen in the right breast. There is no mammographic evidence of malignancy in the right breast.  Mammographic images were processed with CAD.  IMPRESSION: No mammographic evidence of malignancy in the right  breast.  RECOMMENDATION: 1.  Treatment plan for known left breast malignancy.  2. Screening mammogram in one year.(Code:[id number]-B-01Y)  I have discussed the findings and recommendations with the patient. Results were also provided in writing at the conclusion of the visit. If applicable, a reminder letter will be sent to the patient regarding the next appointment.  BI-RADS CATEGORY  1: Negative.   Electronically Signed   By: Everlean Alstrom M.D.   On: 10/26/2013 13:51   Mm Lt Breast Bx W Loc Dev 1st Lesion Image Bx Spec Stereo Guide  10/19/2013   CLINICAL DATA:  79 year old female for tissue sampling of developing calcifications in the upper outer left breast  EXAM: LEFT  STEREOTACTIC CORE NEEDLE BIOPSY  COMPARISON:  Previous exams.  FINDINGS: The patient and I discussed the procedure of stereotactic-guided biopsy including benefits and alternatives. We discussed the high likelihood of a successful procedure. We discussed the risks of the procedure including infection, bleeding, tissue injury, clip migration, and inadequate sampling. Informed written consent was given. The usual time out protocol was performed immediately prior to the procedure.  Using sterile technique and 2% Lidocaine as local anesthetic, under stereotactic guidance, a 9 gauge vacuum assisted needle device was used to perform core needle biopsy of calcifications in the upper outer left breast using a superior approach. Specimen radiograph was performed showing calcifications. Specimens with calcifications are identified for pathology.  At the conclusion of the procedure, a ribbon shaped tissue marker clip was deployed into the biopsy cavity. Follow-up 2-view mammogram confirmed clip placement to be satisfactory.  IMPRESSION: Stereotactic-guided biopsy of left breast calcifications. No apparent complications.  Final pathology demonstrates DCIS.  Histology correlates with imaging findings.  The patient was contacted by phone on 10/19/2013. and these  results given to her which she understood. Her questions were answered.  The patient had no complaints with her biopsy site. .  Recommend surgery/oncology consultation. An appointment with Dr. Excell Seltzer [facility] surgery) has been scheduled for 10/26/2013 and the patient informed.  Recommend breast MRI. The patient will be contacted by our office with her appointment.   Electronically Signed   By: Hassan Rowan M.D.   On: 10/19/2013 12:50   [place] Lt Breast Bx W Loc Dev 1st Lesion Img Bx Spec US Guide  11/01/2013   ADDENDUM REPORT: 11/01/2013 15:47  ADDENDUM: Pathology results: Pathology results from the left axillary lymph node biopsy revealed metastatic invasive mammary carcinoma. This is concordant with the imaging findings. The patient has been notified of the results. She is doing well and denies any biopsy site complications.  Treatment plan for her known left breast cancer is recommended. The patient has been instructed to call the [facility] with any questions or concerns.   Electronically Signed   By: Everlean Alstrom M.D.   On: 11/01/2013 15:47   11/01/2013   CLINICAL DATA:  79 year old female with recently diagnosed ductal carcinoma in situ of the left breast. Morphologically abnormal lymph nodes seen in the left axilla on recent breast MRI.  EXAM: ULTRASOUND GUIDED LEFT BREAST (AXILLA) CORE NEEDLE BIOPSY WITH VACUUM ASSIST  COMPARISON:  Previous exams.  PROCEDURE: I met with the patient and we discussed the procedure of ultrasound-guided biopsy, including benefits and alternatives. We discussed the high likelihood of a successful procedure. We discussed the risks of the procedure including infection, bleeding, tissue injury, clip migration, and inadequate sampling. Informed written consent was given. The usual time-out protocol was performed immediately prior to the procedure.  Using sterile technique and 2% Lidocaine as local anesthetic, under direct ultrasound visualization, a 12 gauge  vacuum-assisteddevice was used to perform biopsy of a morphologically abnormal lymph node in the low left axilla using a lateral approach.  IMPRESSION: Ultrasound-guided biopsy of a morphologically abnormal lymph node in the left axilla. No apparent complications.  Electronically Signed: By: Everlean Alstrom M.D. On: 10/31/2013 10:55   Mm Lt Plc Breast Loc Dev   1st Lesion  Inc Mammo Guide  11/05/2013   CLINICAL DATA:  Preoperative localization for surgical excision of the left breast DCIS.  EXAM: NEEDLE LOCALIZATION OF THE left BREAST WITH MAMMO GUIDANCE  COMPARISON:  Previous exams.  FINDINGS: Patient presents for needle  localization prior to surgical excision of left breast DCIS. I met with the patient and we discussed the procedure of needle localization including benefits and alternatives. We discussed the high likelihood of a successful procedure. We discussed the risks of the procedure, including infection, bleeding, tissue injury, and further surgery. Informed, written consent was given. The usual time-out protocol was performed immediately prior to the procedure.  Using mammographic guidance, sterile technique, 2% lidocaine and a 5 cm modified Kopans needle, the clip was localized using lateral approach. The films were marked for Dr. Excell Seltzer.  Specimen radiograph was performed at surgery and confirms the clip an intact wire to be present in the tissue sample. The specimen was marked for pathology.  IMPRESSION: Needle localization of the left breast. No apparent complications.   Electronically Signed   By: Luberta Robertson M.D.   On: 11/05/2013 17:12     LABS:    Chemistry      Component Value Date/Time   NA 139 11/16/2013 1457   NA 138 11/02/2013 1230   K 3.9 11/16/2013 1457   K 4.2 11/02/2013 1230   CL 101 11/02/2013 1230   CO2 24 11/16/2013 1457   CO2 26 11/02/2013 1230   BUN 19.2 11/16/2013 1457   BUN 20 11/02/2013 1230   CREATININE 0.9 11/16/2013 1457   CREATININE 0.95 11/02/2013 1230       Component Value Date/Time   CALCIUM 9.5 11/16/2013 1457   CALCIUM 9.2 11/02/2013 1230   ALKPHOS 59 11/16/2013 1457   ALKPHOS 68 05/18/2012 0923   AST 17 11/16/2013 1457   AST 11 05/18/2012 0923   ALT 21 11/16/2013 1457   ALT 11 05/18/2012 0923   BILITOT 0.46 11/16/2013 1457   BILITOT 0.8 05/18/2012 0923      Lab Results  Component Value Date   WBC 6.6 11/16/2013   HGB 12.2 11/16/2013   HCT 37.0 11/16/2013   MCV 88.9 11/16/2013   PLT 161 11/16/2013   PATHOLOGY Diagnosis 1. Breast, lumpectomy, Left - DUCTAL CARCINOMA IN SITU, 1.5 CM, ASSOCIATED WITH BIOPSY SITE REACTION. - MARGINS NOT INVOLVED BY DUCTAL CARCINOMA IN SITU. - SCATTERED FOCI OF INTRAVASCULAR CARCINOMA. - SEE ONCOLOGY TABLE AND COMMENT. 2. Breast, excision, Left further inferior margin - FIBROCYSTIC CHANGES. - NO MALIGNANCY IDENTIFIED. 3. Lymph nodes, regional resection, Left axillary contents - METASTATIC CARCINOMA IN FOUR OF TEN LYMPH NODES (4/10). Microscopic Comment 1. BREAST, INVASIVE TUMOR, WITH LYMPH NODE SAMPLING Specimen, including laterality and lymph node sampling (sentinel, non-sentinel): Left breast and axillary lymph nodes Procedure: Lumpectomy with axillary lymph nodes Histologic type: See comment Grade: II Tubule formation: 3 Nuclear pleomorphism: 2 Mitotic: 2 Tumor size (gross measurement): 1.5 cm ductal carcinoma in situ, see comment Margins: Free of tumor Invasive, distance to closest margin: Intravascular tumor focally less than 0.1 cm from medial margin In-situ, distance to closest margin: 0.3 cm from anterior margin If margin positive, focally or broadly: N/A Lymphovascular invasion: Present, scattered foci, see comment. Ductal carcinoma in situ: Present Grade: High grade Extensive intraductal component: N/A Lobular neoplasia: No Tumor focality: See comment Treatment effect: No 1 of 3 FINAL for GABRIANA, WILMOTT ([id number]) Microscopic Comment(continued) If present, treatment effect in  breast tissue, lymph nodes or both: N/A Extent of tumor: Skin: N/A Nipple: N/A Skeletal muscle: Free of tumor Lymph nodes: Examined: 0 Sentinel 10 Non-sentinel 10 Total Lymph nodes with metastasis: 4 Isolated tumor cells (< 0.2 mm): 0 Micrometastasis: (> 0.2 mm and < 2.0 mm): 0 Macrometastasis: (>  2.0 mm): 4 Extracapsular extension: Present Breast prognostic profile: Case [id number] Estrogen receptor: 100%, strong staining Progesterone receptor: 4%, positive Her 2 neu: N/A Ki-67: N/A Non-neoplastic breast: Fibrocystic changes with focal pseudoangiomatous stromal hyperplasia TNM: See comment Comments: In the area of localization there is biopsy site reaction and several foci of residual ductal carcinoma in situ, estimated at 1.5 cm. There are also scattered foci within the specimen demonstrating lymphatic vascular involvement by carcinoma which focally is less than 0.1 cm from the medial margin of the specimen. Multiple additional sections of the specimen are submitted and show focal lymphatic vascular involvement by carcinoma. No invasive carcinoma is identified. There are ten lymph nodes isolated and four show metastatic carcinoma and in several of the involved nodes there is lymphatic vascular involvement by tumor adjacent to the involved nodes. The case was discussed with Dr. Excell Seltzer on 11/08/13. (JDP:kh 11/08/13) Claudette Laws MD Pathologist, Electronic Signature (Case signed 11/08/2013)  ASSESSMENT/PLAN   79 year old female with  #1 stage III (Tis N2) ductal carcinoma with intravascular carcinoma and 4 of 10 positive lymph nodes. Tumor ER positive PR positive. HER-2/neu Ki-67 not performed. Patient is status post partial mastectomy with axillary lymph node dissection. Postoperatively she is doing well.  #2 We spent the better part of today's hour-long appointment discussing the biology of breast cancer in general, and the specifics of the patient's tumor in particular. We  discussed the final pathology results. We discussed invasive and noninvasive breast cancer pathophysiology. Patient obviously originally had a noninvasive disease. But on the final pathology she does have positive lymph nodes and most likely harbors occult invasive disease. I discussed the significance of this. We discussed treatment for breast cancer in general which includes multimodality including surgery systemic adjuvant chemotherapy/adjuvant antiestrogen therapy and adjuvant radiation therapy in case of breast conservation type of surgery.  #3 chemotherapy: We discussed the different chemotherapeutic agents available to [place] including anthracycline-based chemotherapy versus non-anthracycline-based treatment regimens. We discussed use of Taxotere and Cytoxan given every 21 days for total of 6 cycles. We discussed use of growth factors such as Neulasta on day 2 of chemotherapy. We discussed risks benefits and side effects of chemotherapy in detail including but not limited to nausea vomiting diarrhea or constipation. She may also experience mucositis weakness fatigue dehydration. We discussed myelosuppression from chemotherapy as well as immunosuppression. We discussed the role of growth factors. We discussed side effects of growth factors.  #4 this patient would receive Taxotere Cytoxan every 3 weeks for duration of 6 cycles. This would be followed by adjuvant radiation therapy to the breast as well as axilla. Patient will need radiation therapy consultation. I believe she is gong to be seen by Dr. Eppie Gibson next week.  #5 we discussed staging studies to look for any evidence of distant disease. I would recommend PET/CT.  #6 we discussed chemotherapy teaching class, placement of Port-A-Cath by Dr. Excell Seltzer, we would also obtain an echocardiogram in the 79 year old patient.  #7 we discussed anti-emetic use including Zofran, Compazine, dexamethasone ,and Ativan. Prescriptions will be sent to  her pharmacy. I counseled them on how to take it. We will give them written information as well.  #8 my plan will be to get the patient started on adjuvant chemotherapy starting in first week of March 2015.  Discussion: Patient is being treated per NCCN breast cancer care guidelines appropriate for stage.III   Thank you so much for allowing me to participate in the care of Kim [name]. I  will continue to follow up the patient with you and assist in her care.  All questions were answered. The patient knows to call the clinic with any problems, questions or concerns. We can certainly see the patient much sooner if necessary.  I spent 55 minutes counseling the patient face to face. The total time spent in the appointment was 60 minutes.  Marcy Panning, MD Medical/Oncology [facility] [phone] (beeper) [phone] (Office)  11/16/2013, 4:09 PM

## 2013-11-16 NOTE — Progress Notes (Signed)
Gave the patient the [facility] packet,- new patient with no financial issues.

## 2013-11-16 NOTE — Progress Notes (Signed)
Received chart back and added to spreadsheet then placed on Dr. Laurelyn Sickle desk.

## 2013-11-19 ENCOUNTER — Telehealth: Payer: Self-pay | Admitting: *Deleted

## 2013-11-19 ENCOUNTER — Encounter: Payer: Self-pay | Admitting: Oncology

## 2013-11-19 ENCOUNTER — Telehealth: Payer: Self-pay | Admitting: Oncology

## 2013-11-19 MED ORDER — DEXAMETHASONE 4 MG PO TABS: 8.0000 mg | ORAL_TABLET | Freq: Two times a day (BID) | ORAL | Status: DC

## 2013-11-19 MED ORDER — LIDOCAINE-PRILOCAINE 2.5-2.5 % EX CREA: TOPICAL_CREAM | CUTANEOUS | Status: DC | PRN

## 2013-11-19 MED ORDER — PROCHLORPERAZINE MALEATE 10 MG PO TABS: 10.0000 mg | ORAL_TABLET | Freq: Four times a day (QID) | ORAL | Status: DC | PRN

## 2013-11-19 MED ORDER — LORAZEPAM 0.5 MG PO TABS: 0.5000 mg | ORAL_TABLET | Freq: Four times a day (QID) | ORAL | Status: DC | PRN

## 2013-11-19 MED ORDER — ONDANSETRON HCL 8 MG PO TABS
8.0000 mg | ORAL_TABLET | Freq: Two times a day (BID) | ORAL | Status: DC
Start: 2013-11-19 — End: 2013-12-13

## 2013-11-19 NOTE — Telephone Encounter (Signed)
, °

## 2013-11-19 NOTE — Telephone Encounter (Signed)
Per staff message and POF I have scheduled appts.  JMW  

## 2013-11-20 ENCOUNTER — Encounter: Payer: Self-pay | Admitting: Radiation Oncology

## 2013-11-20 ENCOUNTER — Ambulatory Visit
Admission: RE | Admit: 2013-11-20 | Discharge: 2013-11-20 | Disposition: A | Discharge status: Home / Self Care | Payer: Medicare Other | Source: Ambulatory Visit | Attending: Radiation Oncology | Admitting: Radiation Oncology

## 2013-11-20 ENCOUNTER — Ambulatory Visit: Payer: Medicare Other

## 2013-11-20 NOTE — Progress Notes (Signed)
Location of Breast Cancer: Left Upper Outer Quadrant   Histology per Pathology Report:  11/08/13  Diagnosis  1. Breast, lumpectomy, Left  - DUCTAL CARCINOMA IN SITU, 1.5 CM, ASSOCIATED WITH BIOPSY SITE REACTION.  - MARGINS NOT INVOLVED BY DUCTAL CARCINOMA IN SITU.  - SCATTERED FOCI OF INTRAVASCULAR CARCINOMA.  - SEE ONCOLOGY TABLE AND COMMENT.  2. Breast, excision, Left further inferior margin  - FIBROCYSTIC CHANGES.  - NO MALIGNANCY IDENTIFIED.  3. Lymph nodes, regional resection, Left axillary contents  - METASTATIC CARCINOMA IN FOUR OF TEN LYMPH NODES (4/10).  10/31/13  Diagnosis  Lymph node, needle/core biopsy, left axilla  - ONE LYMPH NODE POSITIVE FOR METASTATIC MAMMARY CARCINOMA (1/1).  - SEE COMMENT.  10/18/13  Diagnosis  Breast, left, needle core biopsy, outer left breast  - DUCTAL CARCINOMA IN SITU WITH NECROSIS AND CALCIFICATIONS, SEE COMMENT   Receptor Status: ER(100%), PR (4%), Her2-neu (N/A), Ki-67(N/A)   Found on screening mammography?: Screening mammogram 6 months ago revealed a small area of calcifications in the upper-outer quadrant of the left breast. She was recommended a short term followup mammogram which was performed in January 2015. The mammogram revealed slight increase in the number of calcifications   Past/Anticipated interventions by surgeon, if any: Lumpectomy of Left Breast   Past/Anticipated interventions by medical oncology, if any:  ? Chemotherapy:  Taxotere Cytoxan every 3 weeks for duration of 6 cycles  Lymphedema issues, if any:    Pain issues, if any: soreness left breast and swelling still  SAFETY ISSUES: no Prior radiation? NO Pacemaker/ICD? NO Possible current pregnancy? NO Is the patient on methotrexate? NO   Menarche at age 35, menopause at 51, age at first child 69, G5P2, HRT x 15 years, no none  cancer in family  Current Complaints / other details:    Deirdre Evener, RN  11/20/2013,9:07 AM

## 2013-11-20 NOTE — Progress Notes (Signed)
Location of Breast Cancer:  Left Upper Outer Quadrant  Histology per Pathology Report:   11/08/13 Diagnosis 1. Breast, lumpectomy, Left - DUCTAL CARCINOMA IN SITU, 1.5 CM, ASSOCIATED WITH BIOPSY SITE REACTION. - MARGINS NOT INVOLVED BY DUCTAL CARCINOMA IN SITU. - SCATTERED FOCI OF INTRAVASCULAR CARCINOMA. - SEE ONCOLOGY TABLE AND COMMENT. 2. Breast, excision, Left further inferior margin - FIBROCYSTIC CHANGES. - NO MALIGNANCY IDENTIFIED. 3. Lymph nodes, regional resection, Left axillary contents - METASTATIC CARCINOMA IN FOUR OF TEN LYMPH NODES (4/10).  10/31/13 Diagnosis Lymph node, needle/core biopsy, left axilla - ONE LYMPH NODE POSITIVE FOR METASTATIC MAMMARY CARCINOMA (1/1). - SEE COMMENT.  10/18/13 Diagnosis Breast, left, needle core biopsy, outer left breast - DUCTAL CARCINOMA IN SITU WITH NECROSIS AND CALCIFICATIONS, SEE COMMENT  Receptor Status: ER(100%), PR (4%), Her2-neu (N/A), Ki-67(N/A)   Found on screening mammography?: Screening mammogram 6 months ago revealed a small area of calcifications in the upper-outer quadrant of the left breast. She was recommended a short term followup mammogram which was performed in January 2015. The mammogram revealed slight increase in the number of calcifications   Past/Anticipated interventions by surgeon, if any: Lumpectomy of Left Breast  Past/Anticipated interventions by medical oncology, if any: Chemotherapy   Lymphedema issues, if any:  Pain issues, if any:  SAFETY ISSUES:  Prior radiation?   Pacemaker/ICD?   Possible current pregnancy? Is the patient on methotrexate? Current Complaints / other details:     Deirdre Evener, RN 11/20/2013,9:07 AM

## 2013-11-23 ENCOUNTER — Ambulatory Visit
Admission: RE | Admit: 2013-11-23 | Discharge: 2013-11-23 | Disposition: A | Discharge status: Home / Self Care | Payer: Medicare Other | Source: Ambulatory Visit | Attending: Radiation Oncology | Admitting: Radiation Oncology

## 2013-11-23 ENCOUNTER — Ambulatory Visit (INDEPENDENT_AMBULATORY_CARE_PROVIDER_SITE_OTHER): Payer: Medicare Other | Admitting: Surgery

## 2013-11-23 ENCOUNTER — Encounter: Payer: Self-pay | Admitting: Radiation Oncology

## 2013-11-23 ENCOUNTER — Encounter (INDEPENDENT_AMBULATORY_CARE_PROVIDER_SITE_OTHER): Payer: Self-pay | Admitting: Surgery

## 2013-11-23 VITALS — BP 112/60 | HR 72 | Temp 98.6°F | Resp 16 | Ht 68.0 in | Wt 172.0 lb

## 2013-11-23 VITALS — BP 142/78 | HR 78 | Temp 97.7°F | Resp 20 | Ht 67.5 in | Wt 175.2 lb

## 2013-11-23 DIAGNOSIS — IMO0002 Reserved for concepts with insufficient information to code with codable children: Secondary | ICD-10-CM

## 2013-11-23 DIAGNOSIS — Z9079 Acquired absence of other genital organ(s): Secondary | ICD-10-CM | POA: Insufficient documentation

## 2013-11-23 DIAGNOSIS — C773 Secondary and unspecified malignant neoplasm of axilla and upper limb lymph nodes: Secondary | ICD-10-CM | POA: Insufficient documentation

## 2013-11-23 DIAGNOSIS — C50412 Malignant neoplasm of upper-outer quadrant of left female breast: Secondary | ICD-10-CM

## 2013-11-23 DIAGNOSIS — C50419 Malignant neoplasm of upper-outer quadrant of unspecified female breast: Secondary | ICD-10-CM

## 2013-11-23 DIAGNOSIS — Z79899 Other long term (current) drug therapy: Secondary | ICD-10-CM | POA: Insufficient documentation

## 2013-11-23 DIAGNOSIS — I10 Essential (primary) hypertension: Secondary | ICD-10-CM | POA: Insufficient documentation

## 2013-11-23 DIAGNOSIS — Z90710 Acquired absence of both cervix and uterus: Secondary | ICD-10-CM | POA: Insufficient documentation

## 2013-11-23 HISTORY — DX: Malignant neoplasm of upper-outer quadrant of left female breast: C50.412

## 2013-11-23 NOTE — Progress Notes (Signed)
Checked patient's  Left breast under axilla incision area  swollen, warm to touch and red,breast incision well healed informed MD, patient requesting U/A, had a UTI recently, didn't get lab results after taking antibiotics 7:57 AM

## 2013-11-23 NOTE — Progress Notes (Signed)
Please see the Nurse Progress Note in the MD Initial Consult Encounter for this patient. 

## 2013-11-23 NOTE — Progress Notes (Signed)
General Surgery [facility]  Chief Complaint  Patient presents with  . Post-op Problem    s/p lt br lumpectomy check incision for redness and heat - patient of Dr. Suezanne Jacquet Hoxworth    HISTORY: The patient is a 79 year old female 2 weeks status post left partial mastectomy and axillary lymph node dissection.  She presents with complaints of pain, burning, and swelling in the left breast and left axilla.  EXAM: Surgical incisions appear to be healing uneventfully. There is no cellulitis. There is no sign of infection. There is a moderate seroma in the left breast. There is a large seroma in the left axilla.  Under aseptic conditions using an 18-gauge needle, 610 cc of serous fluid is evacuated from the left axilla. Fluid is discarded. Dry gauze dressing is placed.  IMPRESSION: Postoperative seroma left axilla  PLAN: Usual instructions are provided. Patient will contact the office if she has recurrent symptoms. She is scheduled to see Dr. Excell Seltzer in the office in 5 days.  Earnstine Regal, MD, [facility]   Visit Diagnoses: 1. Breast cancer of upper-outer quadrant of left female breast   2. Postoperative wound seroma, left axilla

## 2013-11-23 NOTE — Progress Notes (Signed)
Radiation Oncology         [phone] ________________________________  Initial outpatient Consultation  Name: Kim [name] MRN: [id number]  Date: 11/23/2013  DOB: 1935/02/06  CC:FRY,STEPHEN A, MD  Hoxworth, Darene Lamer, MD   REFERRING PHYSICIAN: Excell Seltzer Darene Lamer, MD  DIAGNOSIS: TisN2Mx Stage III left ductal carcinoma with 5 of 11 positive lymph nodes    HISTORY OF PRESENT ILLNESS::Kim [name] is a 79 y.o. female who presented with calcifications in her left breast on screening mammography. Breast biopsy on 10/18/2013 revealed ductal carcinoma in situ with necrosis and calcifications. This was ER 100% PR 4%.   MRI 10/26/13  of breasts showed: 1. Biopsy proven DCIS in the upper-outer left breast, within  associated rim enhancing hematoma measuring up to 3.3 cm  craniocaudal. No definite additional sites of disease are seen in  the left breast, although motion mildly limits evaluation.  2. Enlarged morphologically abnormal lymph node in the left axilla.  3. No MRI evidence of malignancy in the right breast  A single left axillary lymph node was biopsied by core needle on 10/31/2013, revealing metastatic mammary carcinoma. Hormone receptor studies revealed ER/PR negative (0%); HER-2/neu status is positive.  Lumpectomy was performed revealing 1.5 cm of grade 2 DCIS at the primary site with scattered foci of intravascular carcinoma. There is lymphatic vascular involvement by tumor adjacent to the involved nodes. Margins are negative for ductal carcinoma in situ. Intervascular tumor is focally less than 0.1 cm from medial margin. No clear evidence of invasive ductal carcinoma. 4 of 10 lymph nodes were positive. On this report, receptor status is ER 0% PR 0% HER-2/neu positive.  She has plans to start Taxotere Cytoxan every 3 weeks for duration of 6 cycles. PET scan pending. Reports soreness left breast and swelling. No family history of breast/ovarian ca.  She took hormone  replacement in her 69s.   PREVIOUS RADIATION THERAPY: No  PAST MEDICAL HISTORY:  has a past medical history of Allergy; Allergic rhinitis; Hypothyroidism; Urinary tract infection; Diverticulosis; adenomatous colonic polyps (02/1999); Tubulovillous adenoma of colon (12/2009); Low back pain; Arthritis; Hypertension; HOH (hard of hearing); Wears hearing aid; and Breast cancer of upper-outer quadrant of left female breast (11/08/13).    PAST SURGICAL HISTORY: Past Surgical History  Procedure Laterality Date  . Abdominal hysterectomy    . Bilateral salpingoophorectomy      see Laurin Coder NP for GYN exams  . Cataract extraction w/ intraocular lens  implant, bilateral  2012    bilateral  . Colonoscopy    . Breast lumpectomy with needle localization and axillary lymph node dissection Left 11/05/2013    Procedure: LEFT BREAST LUMPECTOMY WITH NEEDLE LOCALIZATION and axillary lymph Node Dissection;  Surgeon: Edward Jolly, MD;  Location: [facility];  Service: General;  Laterality: Left;  . Breast surgery      FAMILY HISTORY: family history includes Alcohol abuse in her father; Colon cancer in an other family member; Kidney failure in her mother.  SOCIAL HISTORY:  reports that she has never smoked. She has never used smokeless tobacco. She reports that she does not drink alcohol or use illicit drugs.  ALLERGIES: Propoxyphene n-acetaminophen; Penicillins; Cortisone; and Pseudoephedrine hcl er  MEDICATIONS:  Current Outpatient Prescriptions  Medication Sig Dispense Refill  . acetaminophen (TYLENOL) 650 MG CR tablet Take 650 mg by mouth every 8 (eight) hours as needed.      . Calcium Citrate-Vitamin D (CITRACAL MAXIMUM PO) Take by mouth.        Marland Kitchen  cetirizine (ZYRTEC) 10 MG tablet Take 10 mg by mouth daily as needed for allergies.      Marland Kitchen levothyroxine (SYNTHROID, LEVOTHROID) 125 MCG tablet TAKE 1 TABLET BY MOUTH ONCE A DAY  30 tablet  4  . Multiple Vitamin (MULTIVITAMIN) tablet Take 1  tablet by mouth daily.        . benazepril (LOTENSIN) 10 MG tablet Take 1 tablet (10 mg total) by mouth daily.  30 tablet  10  . dexamethasone (DECADRON) 4 MG tablet Take 2 tablets (8 mg total) by mouth 2 (two) times daily. Start the day before Taxotere. Then again the day after chemo for 3 days.  30 tablet  1  . lidocaine-prilocaine (EMLA) cream Apply topically as needed.  30 g  6  . LORazepam (ATIVAN) 0.5 MG tablet Take 1 tablet (0.5 mg total) by mouth every 6 (six) hours as needed (Nausea or vomiting).  30 tablet  0  . ondansetron (ZOFRAN) 8 MG tablet Take 1 tablet (8 mg total) by mouth 2 (two) times daily. Start the day after chemo for 3 days. Then take as needed for nausea or vomiting.  30 tablet  1  . prochlorperazine (COMPAZINE) 10 MG tablet Take 1 tablet (10 mg total) by mouth every 6 (six) hours as needed (Nausea or vomiting).  30 tablet  1  . [DISCONTINUED] ferrous sulfate 325 (65 FE) MG tablet Take 325 mg by mouth 2 (two) times daily.         No current facility-administered medications for this encounter.    REVIEW OF SYSTEMS:  Notable for that above.   PHYSICAL EXAM:  height is 5' 7.5" (1.715 m) and weight is 175 lb 3.2 oz (79.47 kg). Her oral temperature is 97.7 F (36.5 C). Her blood pressure is 142/78 and her pulse is 78. Her respiration is 20.   General: Alert and oriented, in no acute distress HEENT: Head is normocephalic. Extraocular movements are intact. Oropharynx is clear. Neck:  no palpable cervical or supraclavicular lymphadenopathy. Heart: Regular in rate and rhythm with audible murmurs throughout precordium. Chest: Clear to auscultation bilaterally, with no rhonchi, wheezes, or rales. Abdomen: Soft, nontender, nondistended, with no rigidity or guarding. Extremities: No cyanosis or edema in arms. Neurologic: Cranial nerves II through XII are grossly intact. No obvious focalities. Speech is fluent. Coordination is intact. Psychiatric: Judgment and insight are intact.  Affect is appropriate. Breasts: right breast  unremarkable.  Left breast - UOQ and axillary scars are closed with seromas palpable in both surgical sites. Erythema with mild warmth in L axillary region.   ECOG = 0  0 - Asymptomatic (Fully active, able to carry on all predisease activities without restriction)  1 - Symptomatic but completely ambulatory (Restricted in physically strenuous activity but ambulatory and able to carry out work of a light or sedentary nature. For example, light housework, office work)  2 - Symptomatic, <50% in bed during the day (Ambulatory and capable of all self care but unable to carry out any work activities. Up and about more than 50% of waking hours)  3 - Symptomatic, >50% in bed, but not bedbound (Capable of only limited self-care, confined to bed or chair 50% or more of waking hours)  4 - Bedbound (Completely disabled. Cannot carry on any self-care. Totally confined to bed or chair)  5 - Death   Eustace Pen MM, Creech RH, Tormey DC, et al. [phone]). "Toxicity and response criteria of the [facility]". [place] Oncol.  5 (6): 649-55   LABORATORY DATA:  Lab Results  Component Value Date   WBC 6.6 11/16/2013   HGB 12.2 11/16/2013   HCT 37.0 11/16/2013   MCV 88.9 11/16/2013   PLT 161 11/16/2013   CMP     Component Value Date/Time   NA 139 11/16/2013 1457   NA 138 11/02/2013 1230   K 3.9 11/16/2013 1457   K 4.2 11/02/2013 1230   CL 101 11/02/2013 1230   CO2 24 11/16/2013 1457   CO2 26 11/02/2013 1230   GLUCOSE 100 11/16/2013 1457   GLUCOSE 86 11/02/2013 1230   BUN 19.2 11/16/2013 1457   BUN 20 11/02/2013 1230   CREATININE 0.9 11/16/2013 1457   CREATININE 0.95 11/02/2013 1230   CALCIUM 9.5 11/16/2013 1457   CALCIUM 9.2 11/02/2013 1230   PROT 6.5 11/16/2013 1457   PROT 7.2 05/18/2012 0923   ALBUMIN 3.8 11/16/2013 1457   ALBUMIN 4.4 05/18/2012 0923   AST 17 11/16/2013 1457   AST 11 05/18/2012 0923   ALT 21 11/16/2013 1457   ALT 11 05/18/2012 0923     ALKPHOS 59 11/16/2013 1457   ALKPHOS 68 05/18/2012 0923   BILITOT 0.46 11/16/2013 1457   BILITOT 0.8 05/18/2012 0923   GFRNONAA 57* 11/02/2013 1230   GFRAA 66* 11/02/2013 1230         RADIOGRAPHY: Mr Breast Bilateral W Wo Contrast  10/26/2013   CLINICAL DATA:  79 year old female with recently diagnosed DCIS of the left breast post stereotactic biopsy of a cluster of calcifications measuring up to 1.4 cm.  EXAM: BILATERAL BREAST MRI WITH AND WITHOUT CONTRAST  LABS:  BUN and creatinine were obtained on site at [facility] at  [address]  Results:  BUN 19 mg/dL,  Creatinine 0.9 mg/dL.  TECHNIQUE: Multiplanar, multisequence MR images of both breasts were obtained prior to and following the intravenous administration of 1m of MultiHance.  THREE-DIMENSIONAL MR IMAGE RENDERING ON INDEPENDENT WORKSTATION:  Three-dimensional MR images were rendered by post-processing of the original MR data on an independent workstation. The three-dimensional MR images were interpreted, and findings are reported in the following complete MRI report for this study. Three dimensional images were evaluated at the independent DynaCad workstation  COMPARISON:  Previous exams  FINDINGS: Breast composition: b.  Scattered fibroglandular tissue.  Background parenchymal enhancement: Mild.  Right breast: No suspicious rapidly enhancing masses or abnormal areas of enhancement are seen in the right breast to suggest malignancy.  Left breast: Rim enhancing post biopsy hematoma containing clip artifact is present in the upper-outer left breast. This measures 1.1 cm AP, 1.1 cm transverse, and up to 3.3 cm craniocaudal. No additional suspicious rapidly enhancing masses or abnormal areas of enhancement are seen in the left breast although patient motion mildly limits evaluation.  Lymph nodes: A rounded morphologically abnormal lymph node is present in the left axilla measuring up to 1.2 cm.  Ancillary findings: A splenule is  incidentally seen along the lateral margin of the spleen.  IMPRESSION: 1. Biopsy proven DCIS in the upper-outer left breast, within associated rim enhancing hematoma measuring up to 3.3 cm craniocaudal. No definite additional sites of disease are seen in the left breast, although motion mildly limits evaluation.  2.  Enlarged morphologically abnormal lymph node in the left axilla.  3.  No MRI evidence of malignancy in the right breast.  RECOMMENDATION: 1. A second-look ultrasound (with possible biopsy) of the left axilla is recommended for further evaluation/characterization of the morphologically  abnormal lymph node seen on MRI.  2.  Treatment plan for known left breast malignancy.  BI-RADS CATEGORY  6: Known biopsy-proven malignancy - appropriate action should be taken.   Electronically Signed   By: Everlean Alstrom M.D.   On: 10/26/2013 14:35   Mm Digital Diagnostic Unilat R  10/26/2013   CLINICAL DATA:  79 year old female with recently diagnosed DCIS of the left breast. Screening mammogram of the right breast.  EXAM: DIGITAL DIAGNOSTIC  RIGHT MAMMOGRAM WITH CAD  COMPARISON:  Previous exams.  ACR Breast Density Category b: There are scattered areas of fibroglandular density.  FINDINGS: No suspicious masses or calcifications are seen in the right breast. There is no mammographic evidence of malignancy in the right breast.  Mammographic images were processed with CAD.  IMPRESSION: No mammographic evidence of malignancy in the right breast.  RECOMMENDATION: 1.  Treatment plan for known left breast malignancy.  2. Screening mammogram in one year.(Code:SM-B-01Y)  I have discussed the findings and recommendations with the patient. Results were also provided in writing at the conclusion of the visit. If applicable, a reminder letter will be sent to the patient regarding the next appointment.  BI-RADS CATEGORY  1: Negative.   Electronically Signed   By: Everlean Alstrom M.D.   On: 10/26/2013 13:51   [place] Lt Breast Bx  W Loc Dev 1st Lesion Img Bx Spec US Guide  11/01/2013   ADDENDUM REPORT: 11/01/2013 15:47  ADDENDUM: Pathology results: Pathology results from the left axillary lymph node biopsy revealed metastatic invasive mammary carcinoma. This is concordant with the imaging findings. The patient has been notified of the results. She is doing well and denies any biopsy site complications.  Treatment plan for her known left breast cancer is recommended. The patient has been instructed to call the [facility] with any questions or concerns.   Electronically Signed   By: Everlean Alstrom M.D.   On: 11/01/2013 15:47   11/01/2013   CLINICAL DATA:  79 year old female with recently diagnosed ductal carcinoma in situ of the left breast. Morphologically abnormal lymph nodes seen in the left axilla on recent breast MRI.  EXAM: ULTRASOUND GUIDED LEFT BREAST (AXILLA) CORE NEEDLE BIOPSY WITH VACUUM ASSIST  COMPARISON:  Previous exams.  PROCEDURE: I met with the patient and we discussed the procedure of ultrasound-guided biopsy, including benefits and alternatives. We discussed the high likelihood of a successful procedure. We discussed the risks of the procedure including infection, bleeding, tissue injury, clip migration, and inadequate sampling. Informed written consent was given. The usual time-out protocol was performed immediately prior to the procedure.  Using sterile technique and 2% Lidocaine as local anesthetic, under direct ultrasound visualization, a 12 gauge vacuum-assisteddevice was used to perform biopsy of a morphologically abnormal lymph node in the low left axilla using a lateral approach.  IMPRESSION: Ultrasound-guided biopsy of a morphologically abnormal lymph node in the left axilla. No apparent complications.  Electronically Signed: By: Everlean Alstrom M.D. On: 10/31/2013 10:55   Mm Lt Plc Breast Loc Dev   1st Lesion  Inc Mammo Guide  11/05/2013   CLINICAL DATA:  Preoperative localization for surgical excision of  the left breast DCIS.  EXAM: NEEDLE LOCALIZATION OF THE left BREAST WITH MAMMO GUIDANCE  COMPARISON:  Previous exams.  FINDINGS: Patient presents for needle localization prior to surgical excision of left breast DCIS. I met with the patient and we discussed the procedure of needle localization including benefits and alternatives. We discussed the high likelihood of  a successful procedure. We discussed the risks of the procedure, including infection, bleeding, tissue injury, and further surgery. Informed, written consent was given. The usual time-out protocol was performed immediately prior to the procedure.  Using mammographic guidance, sterile technique, 2% lidocaine and a 5 cm modified Kopans needle, the clip was localized using lateral approach. The films were marked for Dr. Excell Seltzer.  Specimen radiograph was performed at surgery and confirms the clip an intact wire to be present in the tissue sample. The specimen was marked for pathology.  IMPRESSION: Needle localization of the left breast. No apparent complications.   Electronically Signed   By: Luberta Robertson M.D.   On: 11/05/2013 17:12      IMPRESSION/PLAN: Lovely 79 yo with TisN2Mx Left breast ductal carcinoma.  I talked to her about the option of a mastectomy and informed her that her expected overall survival would be equivalent between mastectomy and breast conservation, based upon randomized controlled data. She is enthusiastic about breast conservation which has been pursued with lumpectomy.  She will under PET staging next week.  Assuming her PET is negative for metastases, the plan as discussed at tumor board is for her to receive chemotherapy as described above.  Following this, I recommended RT to her L breast and regional nodes to decrease chance of local regional recurrence by 2/3.  It was a pleasure meeting the patient today. We discussed the risks, benefits, and side effects of radiotherapy. We discussed that radiation would take  approximately 6 weeks to complete and that I would give the patient a few weeks after completing chemotherapy before starting treatment. We spoke about acute effects including skin irritation and fatigue as well as much less common late effects including lung and heart irritation. We spoke about the latest technology that is used to minimize the risk of late effects for breast cancer patients undergoing radiotherapy. No guarantees of treatment were given. The patient is enthusiastic about proceeding with treatment. I look forward to participating in the patient's care.  She had some concerns re: the swelling and pain in her left axilla.  She has no fevers associated with this. I recommended she call Dr. Lear Ng clinic to move up her f/u with him.  I spent 30 minutes  face to face with the patient and more than 50% of that time was spent in counseling and/or coordination of care.    __________________________________________   Eppie Gibson, MD

## 2013-11-27 ENCOUNTER — Ambulatory Visit (HOSPITAL_COMMUNITY): Payer: Medicare Other

## 2013-11-27 ENCOUNTER — Other Ambulatory Visit: Payer: Medicare Other

## 2013-11-28 ENCOUNTER — Encounter (INDEPENDENT_AMBULATORY_CARE_PROVIDER_SITE_OTHER): Payer: Self-pay | Admitting: General Surgery

## 2013-11-28 ENCOUNTER — Ambulatory Visit (INDEPENDENT_AMBULATORY_CARE_PROVIDER_SITE_OTHER): Payer: Medicare Other | Admitting: General Surgery

## 2013-11-28 ENCOUNTER — Encounter (INDEPENDENT_AMBULATORY_CARE_PROVIDER_SITE_OTHER): Payer: Medicare Other | Admitting: General Surgery

## 2013-11-28 VITALS — BP 125/81 | HR 81 | Temp 97.7°F | Resp 14 | Ht 68.0 in | Wt 175.6 lb

## 2013-11-28 DIAGNOSIS — IMO0002 Reserved for concepts with insufficient information to code with codable children: Secondary | ICD-10-CM

## 2013-11-28 NOTE — Progress Notes (Signed)
History: His return to the office status post left breast lumpectomy and axillary dissection. She developed a postoperative seroma. Her drain had come out a little bit early at home. Dr. Gerkin aspirated this 5 days ago but she has recurrent swelling.  Exam: There is a very large seroma in the left axilla. Right breast incision healing well with minimal seroma.  Assessment and plan: Postoperative seroma. This is quite large and recurred quickly. I recommended we place a seroma catheter. Under sterile technique with local anesthesia the catheter was placed and sutured in position and we immediately drained over 200 cc of clear serous fluid. She husband understand the care for the drain the middle ear do this twice daily and as needed.  The patient is reluctant to go ahead with chemotherapy while this is going on I would agree we should postpone her chemotherapy until the seroma is resolved. However I think is fine to go ahead and get her Port-A-Cath placed next week and she is agreeable to this. We discussed this procedure and its indications as well as risks of leaving, infection, pneumothorax, catheter displacement or occlusion and the risk of DVT. She is agreeable to placement next week but she may not be ready for chemotherapy by March the due to her seroma  

## 2013-11-29 ENCOUNTER — Other Ambulatory Visit: Payer: Self-pay | Admitting: *Deleted

## 2013-11-29 ENCOUNTER — Encounter (HOSPITAL_COMMUNITY): Payer: Medicare Other

## 2013-11-29 ENCOUNTER — Ambulatory Visit (HOSPITAL_COMMUNITY): Payer: Medicare Other

## 2013-11-30 ENCOUNTER — Telehealth: Payer: Self-pay | Admitting: *Deleted

## 2013-11-30 ENCOUNTER — Other Ambulatory Visit: Payer: Self-pay | Admitting: *Deleted

## 2013-11-30 ENCOUNTER — Telehealth: Payer: Self-pay | Admitting: Oncology

## 2013-11-30 NOTE — Telephone Encounter (Signed)
, °

## 2013-11-30 NOTE — Telephone Encounter (Signed)
Appts moved per schuduler due to port placement

## 2013-11-30 NOTE — Telephone Encounter (Signed)
Per staff message and POF I have scheduled appts.  JMW  

## 2013-12-02 DIAGNOSIS — I219 Acute myocardial infarction, unspecified: Secondary | ICD-10-CM

## 2013-12-02 HISTORY — DX: Acute myocardial infarction, unspecified: I21.9

## 2013-12-04 ENCOUNTER — Encounter: Payer: Self-pay | Admitting: *Deleted

## 2013-12-04 ENCOUNTER — Other Ambulatory Visit: Payer: Medicare Other

## 2013-12-06 ENCOUNTER — Other Ambulatory Visit: Payer: Medicare Other

## 2013-12-06 ENCOUNTER — Encounter (HOSPITAL_COMMUNITY): Admission: RE | Payer: Self-pay | Source: Ambulatory Visit

## 2013-12-06 ENCOUNTER — Ambulatory Visit (HOSPITAL_COMMUNITY): Admission: RE | Admit: 2013-12-06 | Payer: Medicare Other | Source: Ambulatory Visit | Admitting: General Surgery

## 2013-12-06 ENCOUNTER — Ambulatory Visit: Payer: Medicare Other

## 2013-12-06 ENCOUNTER — Ambulatory Visit: Payer: Medicare Other | Admitting: Oncology

## 2013-12-06 SURGERY — INSERTION, TUNNELED CENTRAL VENOUS DEVICE, WITH PORT
Anesthesia: Choice

## 2013-12-07 ENCOUNTER — Ambulatory Visit: Payer: Medicare Other

## 2013-12-07 ENCOUNTER — Encounter (HOSPITAL_BASED_OUTPATIENT_CLINIC_OR_DEPARTMENT_OTHER): Payer: Self-pay | Admitting: *Deleted

## 2013-12-07 ENCOUNTER — Ambulatory Visit (HOSPITAL_COMMUNITY)
Admission: RE | Admit: 2013-12-07 | Discharge: 2013-12-07 | Disposition: A | Discharge status: Home / Self Care | Payer: Medicare Other | Source: Ambulatory Visit | Attending: Oncology | Admitting: Oncology

## 2013-12-07 DIAGNOSIS — I059 Rheumatic mitral valve disease, unspecified: Secondary | ICD-10-CM | POA: Insufficient documentation

## 2013-12-07 DIAGNOSIS — C50412 Malignant neoplasm of upper-outer quadrant of left female breast: Secondary | ICD-10-CM

## 2013-12-07 DIAGNOSIS — Z01818 Encounter for other preprocedural examination: Secondary | ICD-10-CM | POA: Insufficient documentation

## 2013-12-07 DIAGNOSIS — C50919 Malignant neoplasm of unspecified site of unspecified female breast: Secondary | ICD-10-CM | POA: Insufficient documentation

## 2013-12-07 NOTE — Progress Notes (Signed)
Pt was here for surgery 2/15-did well-no labs needed

## 2013-12-07 NOTE — Progress Notes (Signed)
  Echocardiogram 2D Echocardiogram limited has been performed.  Eshaan Titzer 12/07/2013, 10:23 AM

## 2013-12-11 ENCOUNTER — Ambulatory Visit (HOSPITAL_BASED_OUTPATIENT_CLINIC_OR_DEPARTMENT_OTHER)
Admission: RE | Admit: 2013-12-11 | Discharge: 2013-12-11 | Disposition: A | Discharge status: Home / Self Care | Payer: Medicare Other | Source: Ambulatory Visit | Attending: General Surgery | Admitting: General Surgery

## 2013-12-11 ENCOUNTER — Ambulatory Visit (HOSPITAL_BASED_OUTPATIENT_CLINIC_OR_DEPARTMENT_OTHER): Payer: Medicare Other | Admitting: Anesthesiology

## 2013-12-11 ENCOUNTER — Ambulatory Visit (HOSPITAL_COMMUNITY): Payer: Medicare Other

## 2013-12-11 ENCOUNTER — Encounter (HOSPITAL_BASED_OUTPATIENT_CLINIC_OR_DEPARTMENT_OTHER): Payer: Self-pay | Admitting: *Deleted

## 2013-12-11 ENCOUNTER — Encounter (HOSPITAL_BASED_OUTPATIENT_CLINIC_OR_DEPARTMENT_OTHER)
Admission: RE | Disposition: A | Discharge status: Home / Self Care | Payer: Self-pay | Source: Ambulatory Visit | Attending: General Surgery

## 2013-12-11 ENCOUNTER — Encounter (HOSPITAL_BASED_OUTPATIENT_CLINIC_OR_DEPARTMENT_OTHER): Payer: Medicare Other | Admitting: Anesthesiology

## 2013-12-11 DIAGNOSIS — C50919 Malignant neoplasm of unspecified site of unspecified female breast: Secondary | ICD-10-CM | POA: Insufficient documentation

## 2013-12-11 DIAGNOSIS — I10 Essential (primary) hypertension: Secondary | ICD-10-CM | POA: Insufficient documentation

## 2013-12-11 DIAGNOSIS — C50412 Malignant neoplasm of upper-outer quadrant of left female breast: Secondary | ICD-10-CM

## 2013-12-11 DIAGNOSIS — Z9010 Acquired absence of unspecified breast and nipple: Secondary | ICD-10-CM | POA: Insufficient documentation

## 2013-12-11 HISTORY — PX: PORTACATH PLACEMENT: SHX2246

## 2013-12-11 SURGERY — INSERTION, TUNNELED CENTRAL VENOUS DEVICE, WITH PORT
Anesthesia: Monitor Anesthesia Care | Site: Chest | Laterality: Right

## 2013-12-11 MED ORDER — LIDOCAINE HCL (CARDIAC) 20 MG/ML IV SOLN
INTRAVENOUS | Status: DC | PRN
  Administered 2013-12-11: 50 mg via INTRAVENOUS

## 2013-12-11 MED ORDER — FENTANYL CITRATE 0.05 MG/ML IJ SOLN
INTRAMUSCULAR | Status: DC | PRN
  Administered 2013-12-11 (×2): 50 ug via INTRAVENOUS

## 2013-12-11 MED ORDER — CHLORHEXIDINE GLUCONATE 4 % EX LIQD: 1.0000 "application " | Freq: Once | CUTANEOUS | Status: DC

## 2013-12-11 MED ORDER — HEPARIN (PORCINE) IN NACL 2-0.9 UNIT/ML-% IJ SOLN
INTRAMUSCULAR | Status: DC | PRN
  Administered 2013-12-11: 500 mL

## 2013-12-11 MED ORDER — HEPARIN (PORCINE) IN NACL 2-0.9 UNIT/ML-% IJ SOLN
INTRAMUSCULAR | Status: AC
  Filled 2013-12-11: qty 500

## 2013-12-11 MED ORDER — HYDROCODONE-ACETAMINOPHEN 5-325 MG PO TABS: 1.0000 | ORAL_TABLET | Freq: Four times a day (QID) | ORAL | Status: DC | PRN

## 2013-12-11 MED ORDER — SODIUM BICARBONATE 4 % IV SOLN
INTRAVENOUS | Status: DC | PRN
  Administered 2013-12-11: 5 mL via INTRAVENOUS

## 2013-12-11 MED ORDER — CIPROFLOXACIN IN D5W 400 MG/200ML IV SOLN
INTRAVENOUS | Status: AC
  Filled 2013-12-11: qty 200

## 2013-12-11 MED ORDER — HEPARIN SOD (PORK) LOCK FLUSH 100 UNIT/ML IV SOLN
INTRAVENOUS | Status: DC | PRN
  Administered 2013-12-11: 500 [IU] via INTRAVENOUS

## 2013-12-11 MED ORDER — LIDOCAINE HCL (PF) 1 % IJ SOLN
INTRAMUSCULAR | Status: DC | PRN
  Administered 2013-12-11: 5 mL

## 2013-12-11 MED ORDER — FENTANYL CITRATE 0.05 MG/ML IJ SOLN
INTRAMUSCULAR | Status: AC
  Filled 2013-12-11: qty 4

## 2013-12-11 MED ORDER — DEXAMETHASONE SODIUM PHOSPHATE 10 MG/ML IJ SOLN
INTRAMUSCULAR | Status: DC | PRN
  Administered 2013-12-11: 10 mg via INTRAVENOUS

## 2013-12-11 MED ORDER — FENTANYL CITRATE 0.05 MG/ML IJ SOLN: 25.0000 ug | INTRAMUSCULAR | Status: DC | PRN

## 2013-12-11 MED ORDER — SODIUM BICARBONATE 4 % IV SOLN
INTRAVENOUS | Status: AC
  Filled 2013-12-11: qty 5

## 2013-12-11 MED ORDER — CIPROFLOXACIN IN D5W 400 MG/200ML IV SOLN
400.0000 mg | INTRAVENOUS | Status: AC
  Administered 2013-12-11: 400 mg via INTRAVENOUS

## 2013-12-11 MED ORDER — FENTANYL CITRATE 0.05 MG/ML IJ SOLN: 50.0000 ug | INTRAMUSCULAR | Status: DC | PRN

## 2013-12-11 MED ORDER — ONDANSETRON HCL 4 MG/2ML IJ SOLN
INTRAMUSCULAR | Status: DC | PRN
  Administered 2013-12-11: 4 mg via INTRAVENOUS

## 2013-12-11 MED ORDER — HEPARIN SOD (PORK) LOCK FLUSH 100 UNIT/ML IV SOLN
INTRAVENOUS | Status: AC
  Filled 2013-12-11: qty 5

## 2013-12-11 MED ORDER — MIDAZOLAM HCL 2 MG/2ML IJ SOLN: 1.0000 mg | INTRAMUSCULAR | Status: DC | PRN

## 2013-12-11 MED ORDER — ONDANSETRON HCL 4 MG/2ML IJ SOLN: 4.0000 mg | Freq: Once | INTRAMUSCULAR | Status: DC | PRN

## 2013-12-11 MED ORDER — LIDOCAINE HCL (PF) 1 % IJ SOLN
INTRAMUSCULAR | Status: AC
  Filled 2013-12-11: qty 30

## 2013-12-11 MED ORDER — PROPOFOL INFUSION 10 MG/ML OPTIME
INTRAVENOUS | Status: DC | PRN
  Administered 2013-12-11: 100 ug/kg/min via INTRAVENOUS

## 2013-12-11 MED ORDER — LACTATED RINGERS IV SOLN
INTRAVENOUS | Status: DC
  Administered 2013-12-11: 09:00:00 via INTRAVENOUS

## 2013-12-11 SURGICAL SUPPLY — 47 items
ADH SKN CLS APL DERMABOND .7 (GAUZE/BANDAGES/DRESSINGS) ×1
BAG DECANTER FOR FLEXI CONT (MISCELLANEOUS) ×2 IMPLANT
BANDAGE ADH SHEER 1  50/CT (GAUZE/BANDAGES/DRESSINGS) ×2 IMPLANT
BLADE SURG 15 STRL LF DISP TIS (BLADE) ×1 IMPLANT
BLADE SURG 15 STRL SS (BLADE) ×2
CHLORAPREP W/TINT 26ML (MISCELLANEOUS) ×3 IMPLANT
COVER MAYO STAND STRL (DRAPES) ×2 IMPLANT
COVER TABLE BACK 60X90 (DRAPES) ×2 IMPLANT
DECANTER SPIKE VIAL GLASS SM (MISCELLANEOUS) ×1 IMPLANT
DERMABOND ADVANCED (GAUZE/BANDAGES/DRESSINGS) ×1
DERMABOND ADVANCED .7 DNX12 (GAUZE/BANDAGES/DRESSINGS) ×1 IMPLANT
DRAPE C-ARM 42X72 X-RAY (DRAPES) ×2 IMPLANT
DRAPE LAPAROTOMY T 102X78X121 (DRAPES) ×2 IMPLANT
DRAPE UTILITY XL STRL (DRAPES) ×2 IMPLANT
ELECT REM PT RETURN 9FT ADLT (ELECTROSURGICAL) ×2
ELECTRODE REM PT RTRN 9FT ADLT (ELECTROSURGICAL) ×1 IMPLANT
GLOVE BIOGEL PI IND STRL 6.5 (GLOVE) IMPLANT
GLOVE BIOGEL PI IND STRL 8 (GLOVE) ×1 IMPLANT
GLOVE BIOGEL PI INDICATOR 6.5 (GLOVE) ×1
GLOVE BIOGEL PI INDICATOR 8 (GLOVE) ×1
GLOVE ECLIPSE 6.5 STRL STRAW (GLOVE) ×1 IMPLANT
GLOVE EXAM NITRILE MD LF STRL (GLOVE) ×1 IMPLANT
GLOVE SS BIOGEL STRL SZ 7.5 (GLOVE) ×1 IMPLANT
GLOVE SUPERSENSE BIOGEL SZ 7.5 (GLOVE) ×1
GOWN STRL REUS W/ TWL LRG LVL3 (GOWN DISPOSABLE) ×1 IMPLANT
GOWN STRL REUS W/ TWL XL LVL3 (GOWN DISPOSABLE) ×1 IMPLANT
GOWN STRL REUS W/TWL LRG LVL3 (GOWN DISPOSABLE) ×2
GOWN STRL REUS W/TWL XL LVL3 (GOWN DISPOSABLE) ×2
IV CATH PLACEMENT UNIT 16 GA (IV SOLUTION) IMPLANT
IV KIT MINILOC 20X1 SAFETY (NEEDLE) IMPLANT
KIT BARDPORT ISP (Port) IMPLANT
KIT PORT POWER 8FR ISP CVUE (Catheter) ×2 IMPLANT
NDL HYPO 25X1 1.5 SAFETY (NEEDLE) ×1 IMPLANT
NEEDLE HYPO 22GX1.5 SAFETY (NEEDLE) ×2 IMPLANT
NEEDLE HYPO 25X1 1.5 SAFETY (NEEDLE) ×2 IMPLANT
PACK BASIN DAY SURGERY FS (CUSTOM PROCEDURE TRAY) ×2 IMPLANT
PENCIL BUTTON HOLSTER BLD 10FT (ELECTRODE) ×2 IMPLANT
SET SHEATH INTRODUCER 10FR (MISCELLANEOUS) IMPLANT
SHEATH COOK PEEL AWAY SET 9F (SHEATH) IMPLANT
SLEEVE SCD COMPRESS KNEE MED (MISCELLANEOUS) IMPLANT
SUT MON AB 4-0 PC3 18 (SUTURE) ×2 IMPLANT
SUT PROLENE 2 0 CT2 30 (SUTURE) ×2 IMPLANT
SUT SILK 4 0 TIES 17X18 (SUTURE) IMPLANT
SYR 5ML LUER SLIP (SYRINGE) ×2 IMPLANT
SYR CONTROL 10ML LL (SYRINGE) ×2 IMPLANT
TOWEL OR 17X24 6PK STRL BLUE (TOWEL DISPOSABLE) ×4 IMPLANT
TOWEL OR NON WOVEN STRL DISP B (DISPOSABLE) ×2 IMPLANT

## 2013-12-11 NOTE — Anesthesia Preprocedure Evaluation (Signed)
Anesthesia Evaluation  Patient identified by MRN, date of birth, ID band Patient awake    Reviewed: Allergy & Precautions, H&P , NPO status , Patient's Chart, lab work & pertinent test results  Airway Mallampati: I TM Distance: >3 FB Neck ROM: Full    Dental  (+) Teeth Intact, Partial Upper, Dental Advisory Given   Pulmonary  breath sounds clear to auscultation        Cardiovascular hypertension, Pt. on medications Rhythm:Regular Rate:Normal     Neuro/Psych    GI/Hepatic   Endo/Other    Renal/GU      Musculoskeletal   Abdominal   Peds  Hematology   Anesthesia Other Findings   Reproductive/Obstetrics                           Anesthesia Physical Anesthesia Plan  ASA: III  Anesthesia Plan: MAC   Post-op Pain Management:    Induction: Intravenous  Airway Management Planned: Simple Face Mask  Additional Equipment:   Intra-op Plan:   Post-operative Plan:   Informed Consent: I have reviewed the patients History and Physical, chart, labs and discussed the procedure including the risks, benefits and alternatives for the proposed anesthesia with the patient or authorized representative who has indicated his/her understanding and acceptance.   Dental advisory given  Plan Discussed with: CRNA  Anesthesia Plan Comments:         Anesthesia Quick Evaluation

## 2013-12-11 NOTE — Interval H&P Note (Signed)
History and Physical Interval Note:  12/11/2013 9:06 AM  Kim [name]  has presented today for surgery, with the diagnosis of breast cancer  The various methods of treatment have been discussed with the patient and family. After consideration of risks, benefits and other options for treatment, the patient has consented to  Procedure(s): INSERTION PORT-A-CATH (N/A) as a surgical intervention .  The patient's history has been reviewed, patient examined, no change in status, stable for surgery.  I have reviewed the patient's chart and labs.  Questions were answered to the patient's satisfaction.     Cailen Texeira T

## 2013-12-11 NOTE — Op Note (Signed)
Preoperative diagnosis: Cancer of the breast and the poor venous access  Postoperative diagnosis: Same  Procedure: Placement of ClearVue subcutaneous venous port  Surgeon: Excell Seltzer M.D.  Anesthesia: Local with IV sedation  Description of procedure: Patient is brought to the operating room and placed in the supine position on the operating table. IV sedation was administered. The entire upper chest and neck were widely sterilely prepped and draped. Local anesthesia was used to infiltrate the insertion of port site. The right subclavian vein was cannulated with a needle and guidewire without difficulty and position in the superior vena cava was confirmed by fluoroscopy. The introducer was then placed over the guidewire and the flushed catheter placed via the introducer which was stripped away and the tip of the catheter positioned near the cavoatrial junction. A small transverse incision was made in the anterior chest wall and subcutaneous pocket created. The catheter was tunneled into the pocket, trimmed to length, and attached to the flushed port which was positioned in the pocket. The port was sutured to the chest wall with interrupted 2-0 Prolene. The incisions were closed with subcutaneous interrupted Monocryl and the skin incisions closed with subcuticular Monocryl and Dermabond. The port was accessed and flushed and aspirated easily and was left flushed with concentrated heparin solution. Sponge needle as the counts were correct. The patient was taken to recovery in good condition. The seroma cath in the left axilla was removed at the end of the procedure.  Caylea Foronda T  12/11/2013

## 2013-12-11 NOTE — H&P (View-Only) (Signed)
History: His return to the office status post left breast lumpectomy and axillary dissection. She developed a postoperative seroma. Her drain had come out a little bit early at home. Dr. Harlow Asa aspirated this 5 days ago but she has recurrent swelling.  Exam: There is a very large seroma in the left axilla. Right breast incision healing well with minimal seroma.  Assessment and plan: Postoperative seroma. This is quite large and recurred quickly. I recommended we place a seroma catheter. Under sterile technique with local anesthesia the catheter was placed and sutured in position and we immediately drained over 200 cc of clear serous fluid. She husband understand the care for the drain the middle ear do this twice daily and as needed.  The patient is reluctant to go ahead with chemotherapy while this is going on I would agree we should postpone her chemotherapy until the seroma is resolved. However I think is fine to go ahead and get her Port-A-Cath placed next week and she is agreeable to this. We discussed this procedure and its indications as well as risks of leaving, infection, pneumothorax, catheter displacement or occlusion and the risk of DVT. She is agreeable to placement next week but she may not be ready for chemotherapy by March the due to her seroma

## 2013-12-11 NOTE — Anesthesia Postprocedure Evaluation (Signed)
  Anesthesia Post-op Note  Patient: Kim [name]  Procedure(s) Performed: Procedure(s) with comments: INSERTION PORT-A-CATH (Right) - Subclavian Vein; REMOVAL OF JP DRAIN LEFT AXILLA  Patient Location: PACU  Anesthesia Type:MAC  Level of Consciousness: awake, alert  and oriented  Airway and Oxygen Therapy: Patient Spontanous Breathing  Post-op Pain: mild  Post-op Assessment: Post-op Vital signs reviewed  Post-op Vital Signs: Reviewed  Complications: No apparent anesthesia complications

## 2013-12-11 NOTE — Transfer of Care (Signed)
Immediate Anesthesia Transfer of Care Note  Patient: Kim [name]  Procedure(s) Performed: Procedure(s) with comments: INSERTION PORT-A-CATH (Right) - Subclavian Vein  Patient Location: PACU  Anesthesia Type:MAC  Level of Consciousness: awake and alert   Airway & Oxygen Therapy: Patient Spontanous Breathing and Patient connected to face mask oxygen  Post-op Assessment: Report given to PACU RN and Post -op Vital signs reviewed and stable  Post vital signs: Reviewed and stable  Complications: No apparent anesthesia complications

## 2013-12-11 NOTE — Discharge Instructions (Signed)
° ° °PORT-A-CATH: POST OP INSTRUCTIONS ° °Always review your discharge instruction sheet given to you by the facility where your surgery was performed.  ° °1. A prescription for pain medication may be given to you upon discharge. Take your pain medication as prescribed, if needed. If narcotic pain medicine is not needed, then you make take acetaminophen (Tylenol) or ibuprofen (Advil) as needed.  °2. Take your usually prescribed medications unless otherwise directed. °3. If you need a refill on your pain medication, please contact our office. All narcotic pain medicine now requires a paper prescription.  Phoned in and fax refills are no longer allowed by law.  Prescriptions will not be filled after 5 pm or on weekends.  °4. You should follow a light diet for the remainder of the day after your procedure. °5. Most patients will experience some mild swelling and/or bruising in the area of the incision. It may take several days to resolve. °6. It is common to experience some constipation if taking pain medication after surgery. Increasing fluid intake and taking a stool softener (such as Colace) will usually help or prevent this problem from occurring. A mild laxative (Milk of Magnesia or Miralax) should be taken according to package directions if there are no bowel movements after 48 hours.  °7. Unless discharge instructions indicate otherwise, you may remove your bandages 48 hours after surgery, and you may shower at that time. You may have steri-strips (small white skin tapes) in place directly over the incision.  These strips should be left on the skin for 7-10 days.  If your surgeon used Dermabond (skin glue) on the incision, you may shower in 24 hours.  The glue will flake off over the next 2-3 weeks.  °8. If your port is left accessed at the end of surgery (needle left in port), the dressing cannot get wet and should only by changed by a healthcare professional. When the port is no longer accessed (when the  needle has been removed), follow step 7.   °9. ACTIVITIES:  Limit activity involving your arms for the next 72 hours. Do no strenuous exercise or activity for 1 week. You may drive when you are no longer taking prescription pain medication, you can comfortably wear a seatbelt, and you can maneuver your car. °10.You may need to see your doctor in the office for a follow-up appointment.  Please °      check with your doctor.  °11.When you receive a new Port-a-Cath, you will get a product guide and  °      ID card.  Please keep them in case you need them. ° °WHEN TO CALL YOUR DOCTOR ([phone]): °1. Fever over 101.0 °2. Chills °3. Continued bleeding from incision °4. Increased redness and tenderness at the site °5. Shortness of breath, difficulty breathing ° ° °The clinic staff is available to answer your questions during regular business hours. Please don’t hesitate to call and ask to speak to one of the nurses or medical assistants for clinical concerns. If you have a medical emergency, go to the nearest emergency room or call 911.  A surgeon from [facility] is always on call at the hospital.  ° ° ° °For further information, please visit [email or web address] ° ° °Post Anesthesia Home Care Instructions ° °Activity: °Get plenty of rest for the remainder of the day. A responsible adult should stay with you for 24 hours following the procedure.  °For the next 24 hours, DO NOT: °-Drive a car °-  Operate machinery °-Drink alcoholic beverages °-Take any medication unless instructed by your physician °-Make any legal decisions or sign important papers. ° °Meals: °Start with liquid foods such as gelatin or soup. Progress to regular foods as tolerated. Avoid greasy, spicy, heavy foods. If nausea and/or vomiting occur, drink only clear liquids until the nausea and/or vomiting subsides. Call your physician if vomiting continues. ° °Special Instructions/Symptoms: °Your throat may feel dry or sore from the  anesthesia or the breathing tube placed in your throat during surgery. If this causes discomfort, gargle with warm salt water. The discomfort should disappear within 24 hours. ° ° ° ° ° ° °

## 2013-12-12 ENCOUNTER — Encounter (HOSPITAL_BASED_OUTPATIENT_CLINIC_OR_DEPARTMENT_OTHER): Payer: Self-pay | Admitting: General Surgery

## 2013-12-12 ENCOUNTER — Encounter (HOSPITAL_COMMUNITY)
Admission: RE | Admit: 2013-12-12 | Discharge: 2013-12-12 | Disposition: A | Discharge status: Home / Self Care | Payer: Medicare Other | Source: Ambulatory Visit | Attending: Oncology | Admitting: Oncology

## 2013-12-12 ENCOUNTER — Ambulatory Visit (HOSPITAL_COMMUNITY)
Admission: RE | Admit: 2013-12-12 | Discharge: 2013-12-12 | Disposition: A | Discharge status: Home / Self Care | Payer: Medicare Other | Source: Ambulatory Visit | Attending: Oncology | Admitting: Oncology

## 2013-12-12 DIAGNOSIS — C773 Secondary and unspecified malignant neoplasm of axilla and upper limb lymph nodes: Secondary | ICD-10-CM | POA: Insufficient documentation

## 2013-12-12 DIAGNOSIS — C50412 Malignant neoplasm of upper-outer quadrant of left female breast: Secondary | ICD-10-CM

## 2013-12-12 DIAGNOSIS — C50919 Malignant neoplasm of unspecified site of unspecified female breast: Secondary | ICD-10-CM | POA: Insufficient documentation

## 2013-12-12 DIAGNOSIS — R911 Solitary pulmonary nodule: Secondary | ICD-10-CM | POA: Insufficient documentation

## 2013-12-12 DIAGNOSIS — C50419 Malignant neoplasm of upper-outer quadrant of unspecified female breast: Secondary | ICD-10-CM | POA: Insufficient documentation

## 2013-12-12 LAB — GLUCOSE, CAPILLARY: Glucose-Capillary: 105 mg/dL — ABNORMAL HIGH (ref 70–99)

## 2013-12-12 MED ORDER — IOHEXOL 300 MG/ML  SOLN
100.0000 mL | Freq: Once | INTRAMUSCULAR | Status: AC | PRN
  Administered 2013-12-12: 100 mL via INTRAVENOUS

## 2013-12-12 MED ORDER — FLUDEOXYGLUCOSE F - 18 (FDG) INJECTION
9.8000 | Freq: Once | INTRAVENOUS | Status: AC | PRN
  Administered 2013-12-12: 9.8 via INTRAVENOUS

## 2013-12-13 ENCOUNTER — Other Ambulatory Visit: Payer: Self-pay | Admitting: Oncology

## 2013-12-13 ENCOUNTER — Encounter: Payer: Self-pay | Admitting: *Deleted

## 2013-12-13 ENCOUNTER — Encounter (INDEPENDENT_AMBULATORY_CARE_PROVIDER_SITE_OTHER): Payer: Medicare Other | Admitting: General Surgery

## 2013-12-13 ENCOUNTER — Telehealth: Payer: Self-pay

## 2013-12-13 ENCOUNTER — Ambulatory Visit: Payer: Medicare Other

## 2013-12-13 NOTE — Telephone Encounter (Signed)
Pt called wanting results of recent studies/tests prior to 1st chemo tomorrow.  Checked schedule - will see LC prior to chemo.  Let patient know LC will go over her results in appt tomorrow and adjust treatment accordingly as needed.   Pt voiced understanding.   Pt expressed being "overwhelmed" and "everything has moved so fast."  Reassured pt and recommended she put together a binder to track her treatment.  Pt expressed appreciation "I fell much better now".   Confirmed pt attended chemo class on 3/3.

## 2013-12-14 ENCOUNTER — Encounter (INDEPENDENT_AMBULATORY_CARE_PROVIDER_SITE_OTHER): Payer: Medicare Other | Admitting: General Surgery

## 2013-12-14 ENCOUNTER — Other Ambulatory Visit (HOSPITAL_BASED_OUTPATIENT_CLINIC_OR_DEPARTMENT_OTHER): Payer: Medicare Other

## 2013-12-14 ENCOUNTER — Ambulatory Visit: Payer: Medicare Other

## 2013-12-14 ENCOUNTER — Ambulatory Visit (HOSPITAL_BASED_OUTPATIENT_CLINIC_OR_DEPARTMENT_OTHER): Payer: Medicare Other | Admitting: Adult Health

## 2013-12-14 ENCOUNTER — Telehealth: Payer: Self-pay | Admitting: *Deleted

## 2013-12-14 VITALS — BP 129/76 | HR 92 | Temp 98.3°F | Resp 18 | Ht 68.0 in | Wt 175.0 lb

## 2013-12-14 DIAGNOSIS — C50419 Malignant neoplasm of upper-outer quadrant of unspecified female breast: Secondary | ICD-10-CM

## 2013-12-14 DIAGNOSIS — C50412 Malignant neoplasm of upper-outer quadrant of left female breast: Secondary | ICD-10-CM

## 2013-12-14 DIAGNOSIS — Z170 Estrogen receptor positive status [ER+]: Secondary | ICD-10-CM

## 2013-12-14 DIAGNOSIS — C773 Secondary and unspecified malignant neoplasm of axilla and upper limb lymph nodes: Secondary | ICD-10-CM

## 2013-12-14 LAB — CBC WITH DIFFERENTIAL/PLATELET
BASO%: 0.6 % (ref 0.0–2.0)
Basophils Absolute: 0 10*3/uL (ref 0.0–0.1)
EOS%: 1.6 % (ref 0.0–7.0)
Eosinophils Absolute: 0.1 10*3/uL (ref 0.0–0.5)
HCT: 38.3 % (ref 34.8–46.6)
HGB: 12.8 g/dL (ref 11.6–15.9)
LYMPH%: 22.7 % (ref 14.0–49.7)
MCH: 29.5 pg (ref 25.1–34.0)
MCHC: 33.4 g/dL (ref 31.5–36.0)
MCV: 88.2 fL (ref 79.5–101.0)
MONO#: 0.4 10*3/uL (ref 0.1–0.9)
MONO%: 8.4 % (ref 0.0–14.0)
NEUT#: 3.3 10*3/uL (ref 1.5–6.5)
NEUT%: 66.7 % (ref 38.4–76.8)
Platelets: 136 10*3/uL — ABNORMAL LOW (ref 145–400)
RBC: 4.34 10*6/uL (ref 3.70–5.45)
RDW: 13.8 % (ref 11.2–14.5)
WBC: 4.9 10*3/uL (ref 3.9–10.3)
lymph#: 1.1 10*3/uL (ref 0.9–3.3)
nRBC: 0 % (ref 0–0)

## 2013-12-14 LAB — COMPREHENSIVE METABOLIC PANEL (CC13)
ALT: 9 U/L (ref 0–55)
AST: 12 U/L (ref 5–34)
Albumin: 4 g/dL (ref 3.5–5.0)
Alkaline Phosphatase: 65 U/L (ref 40–150)
Anion Gap: 11 mEq/L (ref 3–11)
BUN: 16.3 mg/dL (ref 7.0–26.0)
CO2: 24 mEq/L (ref 22–29)
Calcium: 9.5 mg/dL (ref 8.4–10.4)
Chloride: 105 mEq/L (ref 98–109)
Creatinine: 0.9 mg/dL (ref 0.6–1.1)
Glucose: 91 mg/dl (ref 70–140)
Potassium: 4.2 mEq/L (ref 3.5–5.1)
Sodium: 140 mEq/L (ref 136–145)
Total Bilirubin: 0.62 mg/dL (ref 0.20–1.20)
Total Protein: 6.8 g/dL (ref 6.4–8.3)

## 2013-12-14 MED ORDER — ONDANSETRON 16 MG/50ML IVPB (CHCC)
INTRAVENOUS | Status: AC
  Filled 2013-12-14: qty 16

## 2013-12-14 MED ORDER — DEXAMETHASONE SODIUM PHOSPHATE 20 MG/5ML IJ SOLN
INTRAMUSCULAR | Status: AC
  Filled 2013-12-14: qty 5

## 2013-12-14 NOTE — Progress Notes (Addendum)
Kim [name] [id number] 1935/07/21 79 y.o. 12/14/2013 3:43 PM  CC  Laurey Morale, MD [address] [place] [address] Dr. 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 Excell Seltzer  PRIOR HISTORY:  Kim [name] is a 79 y.o. female.   1. Who presented for a screening mammogram 6 months ago that revealed a small area of calcifications in the upper-outer quadrant of the left breast. She was recommended a short term followup mammogram which was performed in January 2015. The mammogram revealed slight increase in the number of calcifications. Because of this she had a stereotactic biopsy performed on 10/18/2013. The pathology revealed a ductal carcinoma in situ. The tumor was ER positive PR positive. Measuring 1cm. Patient had MRI of the breasts performed which did reveal abnormal left axillary lymph nodes. Patient was seen by Dr. Excell Seltzer who recommended partial left mastectomy with sentinel lymph node biopsy/dissection. Patient went on to have lumpectomy with lymph node dissection performed on 11/05/2013. The final pathology revealed 1.5 cm ductal carcinoma in situ with intravascular carcinoma 4 of 10 lymph nodes were positive for metastatic disease. The tumor was ER positive PR positive.  HER-2/neu and Ki-67 were not performed.   2.  Patient had HER-2/neu positive results from pathology on 11/06/13.  Patient underwent PET/CT on 12/12/13 that was negative for metastatic disease.    CURRENT THERAPY: [facility] therapy with weekly herceptin to begin on 12/17/13  INTERVAL HISTORY:  The patient is here for follow up and evaluation and for test results of her PET/CT scan and final path.  She has a lot of questions about her nausea medications, chemotherapy regimen, echocardiogram and what everything means.  She denies fevers, chills, nausea, vomiting, constipation, diarrhea, numbness, or any other concerns.  Her port was recently placed and that area is sore, otherwise, a 10 point ROS is neg.     Past Medical History: Past Medical History  Diagnosis Date  . Allergy   . Allergic rhinitis   . Hypothyroidism   . Urinary tract infection   . Diverticulosis   . Hx of adenomatous colonic polyps 02/1999  . Tubulovillous adenoma of colon 12/2009    with HGD  . Low back pain     gets ESI from Dr. Rennis Harding  . Arthritis   . Hypertension   . HOH (hard of hearing)   . Wears hearing aid   . Breast cancer of upper-outer quadrant of left female breast 11/08/13    Past Surgical History: Past Surgical History  Procedure Laterality Date  . Abdominal hysterectomy    . Bilateral salpingoophorectomy      see Laurin Coder NP for GYN exams  . Cataract extraction w/ intraocular lens  implant, bilateral  2012  bilateral  . Colonoscopy    . Breast lumpectomy with needle localization and axillary lymph node dissection Left 11/05/2013    Procedure: LEFT BREAST LUMPECTOMY WITH NEEDLE LOCALIZATION and axillary lymph Node Dissection;  Surgeon: Edward Jolly, MD;  Location: [facility];  Service: General;  Laterality: Left;  . Breast surgery    . Portacath placement Right 12/11/2013    Procedure: INSERTION PORT-A-CATH;  Surgeon: Edward Jolly, MD;  Location: [facility];   Service: General;  Laterality: Right;  Subclavian Vein; REMOVAL OF JP DRAIN LEFT AXILLA    Family History: Family History  Problem Relation Age of Onset  . Alcohol abuse Father   . Kidney failure Mother   . Colon cancer      family    Social History History  Substance Use Topics  . Smoking status: Never Smoker   . Smokeless tobacco: Never Used  . Alcohol Use: No    Allergies: Allergies  Allergen Reactions  . Propoxyphene N-Acetaminophen Swelling    tongue swelling  . Penicillins Swelling    Facial swelling  . Cortisone Rash  . Pseudoephedrine Hcl Er Rash and Other (See Comments)    Face gets red    Current Medications: Current Outpatient Prescriptions  Medication Sig Dispense Refill  . acetaminophen (TYLENOL) 650 MG CR tablet Take 650 mg by mouth every 8 (eight) hours as needed.      . benazepril (LOTENSIN) 10 MG tablet Take 1 tablet (10 mg total) by mouth daily.  30 tablet  10  . Calcium Citrate-Vitamin D (CITRACAL MAXIMUM PO) Take by mouth.        . cetirizine (ZYRTEC) 10 MG tablet Take 10 mg by mouth daily as needed for allergies.      Marland Kitchen dexamethasone (DECADRON) 4 MG tablet Take 4 mg by mouth 2 (two) times daily with a meal.      . HYDROcodone-acetaminophen (NORCO) 5-325 MG per tablet Take 1-2 tablets by mouth every 6 (six) hours as needed.  30 tablet  0  . levothyroxine (SYNTHROID, LEVOTHROID) 125 MCG tablet TAKE 1 TABLET BY MOUTH ONCE A DAY  30 tablet  4  . lidocaine-prilocaine (EMLA) cream Apply topically as needed.  30 g  6  . Multiple Vitamin (MULTIVITAMIN) tablet Take 1 tablet by mouth daily.        . ondansetron (ZOFRAN) 8 MG tablet Take 8 mg by mouth every 8 (eight) hours as needed for nausea or vomiting.      . prochlorperazine (COMPAZINE) 10 MG tablet Take 10 mg by mouth every 6 (six) hours as needed for nausea or vomiting.      . [DISCONTINUED] ferrous sulfate 325 (65 FE) MG tablet Take 325 mg by mouth 2 (two) times daily.         No current  facility-administered medications for this visit.         REVIEW OF SYSTEMS:  A 10 point review of systems was conducted and is otherwise negative except for what is noted above.     PHYSICAL EXAMINATION: Blood pressure 129/76, pulse 92, temperature 98.3 F (36.8 C), temperature source Oral, resp. rate 18, height $RemoveBe'5\' 8"'gucnbluRm$  (1.727 m), weight 175 lb (79.379 kg). GENERAL: Patient is a well appearing female in no acute distress HEENT:  Sclerae anicteric.  Oropharynx clear and moist. No ulcerations or evidence of oropharyngeal candidiasis. Neck is supple.  NODES:  No cervical, supraclavicular, or axillary lymphadenopathy palpated.  BREAST EXAM:  Deferred. Port with  ecchymosis and surgi-glue covering insertion site LUNGS:  Clear to auscultation bilaterally.  No wheezes or rhonchi. HEART:  Regular rate and rhythm. No murmur appreciated. ABDOMEN:  Soft, nontender.  Positive, normoactive bowel sounds. No organomegaly palpated. MSK:  No focal spinal tenderness to palpation. Full range of motion bilaterally in the upper extremities. EXTREMITIES:  No peripheral edema.   SKIN:  Clear with no obvious rashes or skin changes. No nail dyscrasia. NEURO:  Nonfocal. Well oriented.  Appropriate affect. ECOG PERFORMANCE STATUS: 1 - Symptomatic but completely ambulatory  STUDIES/RESULTS: Mr Breast Bilateral W Wo Contrast  10/26/2013   CLINICAL DATA:  79 year old female with recently diagnosed DCIS of the left breast post stereotactic biopsy of a cluster of calcifications measuring up to 1.4 cm.  EXAM: BILATERAL BREAST MRI WITH AND WITHOUT CONTRAST  LABS:  BUN and creatinine were obtained on site at [facility] at  [address]  Results:  BUN 19 mg/dL,  Creatinine 0.9 mg/dL.  TECHNIQUE: Multiplanar, multisequence MR images of both breasts were obtained prior to and following the intravenous administration of 5ml of MultiHance.  THREE-DIMENSIONAL MR IMAGE RENDERING ON INDEPENDENT WORKSTATION:   Three-dimensional MR images were rendered by post-processing of the original MR data on an independent workstation. The three-dimensional MR images were interpreted, and findings are reported in the following complete MRI report for this study. Three dimensional images were evaluated at the independent DynaCad workstation  COMPARISON:  Previous exams  FINDINGS: Breast composition: b.  Scattered fibroglandular tissue.  Background parenchymal enhancement: Mild.  Right breast: No suspicious rapidly enhancing masses or abnormal areas of enhancement are seen in the right breast to suggest malignancy.  Left breast: Rim enhancing post biopsy hematoma containing clip artifact is present in the upper-outer left breast. This measures 1.1 cm AP, 1.1 cm transverse, and up to 3.3 cm craniocaudal. No additional suspicious rapidly enhancing masses or abnormal areas of enhancement are seen in the left breast although patient motion mildly limits evaluation.  Lymph nodes: A rounded morphologically abnormal lymph node is present in the left axilla measuring up to 1.2 cm.  Ancillary findings: A splenule is incidentally seen along the lateral margin of the spleen.  IMPRESSION: 1. Biopsy proven DCIS in the upper-outer left breast, within associated rim enhancing hematoma measuring up to 3.3 cm craniocaudal. No definite additional sites of disease are seen in the left breast, although motion mildly limits evaluation.  2.  Enlarged morphologically abnormal lymph node in the left axilla.  3.  No MRI evidence of malignancy in the right breast.  RECOMMENDATION: 1. A second-look ultrasound (with possible biopsy) of the left axilla is recommended for further evaluation/characterization of the morphologically abnormal lymph node seen on MRI.  2.  Treatment plan for known left breast malignancy.  BI-RADS CATEGORY  6: Known biopsy-proven malignancy - appropriate action should be taken.   Electronically Signed   By: Everlean Alstrom M.D.   On:  10/26/2013 14:35   Mm Digital Diagnostic Unilat R  10/26/2013   CLINICAL DATA:  79 year old female with recently diagnosed DCIS of the left breast. Screening mammogram of the right breast.  EXAM: DIGITAL DIAGNOSTIC  RIGHT MAMMOGRAM WITH CAD  COMPARISON:  Previous exams.  ACR Breast Density Category b: There are scattered areas of fibroglandular density.  FINDINGS: No suspicious masses or calcifications are seen in the right breast. There is no mammographic evidence of malignancy in the right breast.  Mammographic images were processed with CAD.  IMPRESSION: No mammographic evidence  of malignancy in the right breast.  RECOMMENDATION: 1.  Treatment plan for known left breast malignancy.  2. Screening mammogram in one year.(Code:[id number]-B-01Y)  I have discussed the findings and recommendations with the patient. Results were also provided in writing at the conclusion of the visit. If applicable, a reminder letter will be sent to the patient regarding the next appointment.  BI-RADS CATEGORY  1: Negative.   Electronically Signed   By: Everlean Alstrom M.D.   On: 10/26/2013 13:51   Mm Lt Breast Bx W Loc Dev 1st Lesion Image Bx Spec Stereo Guide  10/19/2013   CLINICAL DATA:  79 year old female for tissue sampling of developing calcifications in the upper outer left breast  EXAM: LEFT STEREOTACTIC CORE NEEDLE BIOPSY  COMPARISON:  Previous exams.  FINDINGS: The patient and I discussed the procedure of stereotactic-guided biopsy including benefits and alternatives. We discussed the high likelihood of a successful procedure. We discussed the risks of the procedure including infection, bleeding, tissue injury, clip migration, and inadequate sampling. Informed written consent was given. The usual time out protocol was performed immediately prior to the procedure.  Using sterile technique and 2% Lidocaine as local anesthetic, under stereotactic guidance, a 9 gauge vacuum assisted needle device was used to perform core needle  biopsy of calcifications in the upper outer left breast using a superior approach. Specimen radiograph was performed showing calcifications. Specimens with calcifications are identified for pathology.  At the conclusion of the procedure, a ribbon shaped tissue marker clip was deployed into the biopsy cavity. Follow-up 2-view mammogram confirmed clip placement to be satisfactory.  IMPRESSION: Stereotactic-guided biopsy of left breast calcifications. No apparent complications.  Final pathology demonstrates DCIS.  Histology correlates with imaging findings.  The patient was contacted by phone on 10/19/2013. and these results given to her which she understood. Her questions were answered.  The patient had no complaints with her biopsy site. .  Recommend surgery/oncology consultation. An appointment with Dr. Excell Seltzer [facility] surgery) has been scheduled for 10/26/2013 and the patient informed.  Recommend breast MRI. The patient will be contacted by our office with her appointment.   Electronically Signed   By: Hassan Rowan M.D.   On: 10/19/2013 12:50   [place] Lt Breast Bx W Loc Dev 1st Lesion Img Bx Spec US Guide  11/01/2013   ADDENDUM REPORT: 11/01/2013 15:47  ADDENDUM: Pathology results: Pathology results from the left axillary lymph node biopsy revealed metastatic invasive mammary carcinoma. This is concordant with the imaging findings. The patient has been notified of the results. She is doing well and denies any biopsy site complications.  Treatment plan for her known left breast cancer is recommended. The patient has been instructed to call the [facility] with any questions or concerns.   Electronically Signed   By: Everlean Alstrom M.D.   On: 11/01/2013 15:47   11/01/2013   CLINICAL DATA:  79 year old female with recently diagnosed ductal carcinoma in situ of the left breast. Morphologically abnormal lymph nodes seen in the left axilla on recent breast MRI.  EXAM: ULTRASOUND GUIDED LEFT BREAST (AXILLA)  CORE NEEDLE BIOPSY WITH VACUUM ASSIST  COMPARISON:  Previous exams.  PROCEDURE: I met with the patient and we discussed the procedure of ultrasound-guided biopsy, including benefits and alternatives. We discussed the high likelihood of a successful procedure. We discussed the risks of the procedure including infection, bleeding, tissue injury, clip migration, and inadequate sampling. Informed written consent was given. The usual time-out protocol was performed immediately prior  to the procedure.  Using sterile technique and 2% Lidocaine as local anesthetic, under direct ultrasound visualization, a 12 gauge vacuum-assisteddevice was used to perform biopsy of a morphologically abnormal lymph node in the low left axilla using a lateral approach.  IMPRESSION: Ultrasound-guided biopsy of a morphologically abnormal lymph node in the left axilla. No apparent complications.  Electronically Signed: By: Everlean Alstrom M.D. On: 10/31/2013 10:55   Mm Lt Plc Breast Loc Dev   1st Lesion  Inc Mammo Guide  11/05/2013   CLINICAL DATA:  Preoperative localization for surgical excision of the left breast DCIS.  EXAM: NEEDLE LOCALIZATION OF THE left BREAST WITH MAMMO GUIDANCE  COMPARISON:  Previous exams.  FINDINGS: Patient presents for needle localization prior to surgical excision of left breast DCIS. I met with the patient and we discussed the procedure of needle localization including benefits and alternatives. We discussed the high likelihood of a successful procedure. We discussed the risks of the procedure, including infection, bleeding, tissue injury, and further surgery. Informed, written consent was given. The usual time-out protocol was performed immediately prior to the procedure.  Using mammographic guidance, sterile technique, 2% lidocaine and a 5 cm modified Kopans needle, the clip was localized using lateral approach. The films were marked for Dr. Excell Seltzer.  Specimen radiograph was performed at surgery and confirms  the clip an intact wire to be present in the tissue sample. The specimen was marked for pathology.  IMPRESSION: Needle localization of the left breast. No apparent complications.   Electronically Signed   By: Luberta Robertson M.D.   On: 11/05/2013 17:12     LABS:    Chemistry      Component Value Date/Time   NA 140 12/14/2013 1003   NA 138 11/02/2013 1230   K 4.2 12/14/2013 1003   K 4.2 11/02/2013 1230   CL 101 11/02/2013 1230   CO2 24 12/14/2013 1003   CO2 26 11/02/2013 1230   BUN 16.3 12/14/2013 1003   BUN 20 11/02/2013 1230   CREATININE 0.9 12/14/2013 1003   CREATININE 0.95 11/02/2013 1230      Component Value Date/Time   CALCIUM 9.5 12/14/2013 1003   CALCIUM 9.2 11/02/2013 1230   ALKPHOS 65 12/14/2013 1003   ALKPHOS 68 05/18/2012 0923   AST 12 12/14/2013 1003   AST 11 05/18/2012 0923   ALT 9 12/14/2013 1003   ALT 11 05/18/2012 0923   BILITOT 0.62 12/14/2013 1003   BILITOT 0.8 05/18/2012 0923      Lab Results  Component Value Date   WBC 4.9 12/14/2013   HGB 12.8 12/14/2013   HCT 38.3 12/14/2013   MCV 88.2 12/14/2013   PLT 136* 12/14/2013   PATHOLOGY Diagnosis 1. Breast, lumpectomy, Left - DUCTAL CARCINOMA IN SITU, 1.5 CM, ASSOCIATED WITH BIOPSY SITE REACTION. - MARGINS NOT INVOLVED BY DUCTAL CARCINOMA IN SITU. - SCATTERED FOCI OF INTRAVASCULAR CARCINOMA. - SEE ONCOLOGY TABLE AND COMMENT. 2. Breast, excision, Left further inferior margin - FIBROCYSTIC CHANGES. - NO MALIGNANCY IDENTIFIED. 3. Lymph nodes, regional resection, Left axillary contents - METASTATIC CARCINOMA IN FOUR OF TEN LYMPH NODES (4/10). Microscopic Comment 1. BREAST, INVASIVE TUMOR, WITH LYMPH NODE SAMPLING Specimen, including laterality and lymph node sampling (sentinel, non-sentinel): Left breast and axillary lymph nodes Procedure: Lumpectomy with axillary lymph nodes Histologic type: See comment Grade: II Tubule formation: 3 Nuclear pleomorphism: 2 Mitotic: 2 Tumor size (gross measurement): 1.5 cm ductal  carcinoma in situ, see comment Margins: Free of tumor Invasive, distance to closest  margin: Intravascular tumor focally less than 0.1 cm from medial margin In-situ, distance to closest margin: 0.3 cm from anterior margin If margin positive, focally or broadly: N/A Lymphovascular invasion: Present, scattered foci, see comment. Ductal carcinoma in situ: Present Grade: High grade Extensive intraductal component: N/A Lobular neoplasia: No Tumor focality: See comment Treatment effect: No 1 of 3 FINAL for Kim [name], Kim [name] ([id number]) Microscopic Comment(continued) If present, treatment effect in breast tissue, lymph nodes or both: N/A Extent of tumor: Skin: N/A Nipple: N/A Skeletal muscle: Free of tumor Lymph nodes: Examined: 0 Sentinel 10 Non-sentinel 10 Total Lymph nodes with metastasis: 4 Isolated tumor cells (< 0.2 mm): 0 Micrometastasis: (> 0.2 mm and < 2.0 mm): 0 Macrometastasis: (> 2.0 mm): 4 Extracapsular extension: Present Breast prognostic profile: Case [id number] Estrogen receptor: 100%, strong staining Progesterone receptor: 4%, positive Her 2 neu: N/A Ki-67: N/A Non-neoplastic breast: Fibrocystic changes with focal pseudoangiomatous stromal hyperplasia TNM: See comment Comments: In the area of localization there is biopsy site reaction and several foci of residual ductal carcinoma in situ, estimated at 1.5 cm. There are also scattered foci within the specimen demonstrating lymphatic vascular involvement by carcinoma which focally is less than 0.1 cm from the medial margin of the specimen. Multiple additional sections of the specimen are submitted and show focal lymphatic vascular involvement by carcinoma. No invasive carcinoma is identified. There are ten lymph nodes isolated and four show metastatic carcinoma and in several of the involved nodes there is lymphatic vascular involvement by tumor adjacent to the involved nodes. The case was discussed with Dr. Excell Seltzer  on 11/08/13. (JDP:kh 11/08/13) Claudette Laws MD Pathologist, Electronic Signature (Case signed 11/08/2013)  ASSESSMENT/PLAN   79 year old female with  #1 stage III (Tis N2) ductal carcinoma with intravascular carcinoma and 4 of 10 positive lymph nodes. Tumor ER positive PR positive and now HER-2neu positive.    #2 I discussed the PET/CT results with the patient in detail and that they were negative for distant metastases.  I discussed the echocardiogram results with the patient in detail.   #3  We discussed what HER-2/neu positivity means and how it will change her treatment.  I discussed the role of Herceptin, and TCH chemotherapy.  I discussed taxotere/carboplatin/herceptin given once every 21 days with neulasta support and weekly Herceptin.  I reviewed how to take her anti-emetics, possible adverse effects of chemotherapy/treatment and when to call.  I reviewed the risk of cardiotoxicity while receiving Herceptin therapy and the role that Dr. Ardyth Man. Aundra Dubin have in evaluating our herceptin patients and following their echocardiograms every three months or sooner if needed.  I also gave the patient detailed information in her AVS about Taxotere, Carboplatin, Herceptin, her anti-emetics for chemotherapy, the Herceptin schedule, and her referral information for Dr. Ardyth Man. Aundra Dubin.    #4  After an in depth discussion about treatment, the patient would like to wait until Monday to receive chemotherapy.  I have made the appropriate scheduling requests to accommodate this.  She will return on Monday for labs, evaluation, and [facility] therapy.     All questions were answered. The patient knows to call the clinic with any problems, questions or concerns. We can certainly see the patient much sooner if necessary.  I spent 40 minutes counseling the patient face to face. The total time spent in the appointment was 60 minutes.  Minette Headland, [place] [phone] 12/14/2013, 3:43 PM   ATTENDING'S ATTESTATION:  I personally reviewed patient's  chart, examined patient myself, formulated the treatment plan as followed.    I went over the final pathology with the patient. Her tumor turns out to be HER-2 positive. She will now receive Taxotere carboplatinum and Herceptin combination treatment. We discussed risks benefits and side effects and the rationale for this. Patient does feel that she wants to wait until Monday to start her treatments to 2 transportation issues.  I went over patient's results of her scans. She demonstrated understanding. Consent was obtained. Her port site looks good. She'll be seen back on Monday to begin cycle 1 day 1 of [place].  Marcy Panning, MD Medical/Oncology [facility] [phone] (beeper) [phone] (Office)

## 2013-12-14 NOTE — Telephone Encounter (Signed)
appts made and printed. Pt is aware that tx will be added. i emailed MW,SD, and MD to add the tx. ...td

## 2013-12-14 NOTE — Patient Instructions (Addendum)
Nausea medication  Decadron/Dexamethasone: take two tablets twice a day starting the day before chemotherapy.  Then take two tablets twice a day starting the day after chemotherapy for 3 days.    Ondansetron/Zofran: take one tablet twice a day starting the day after chemotherapy for three days.   Prochlorperazine/Compazine: Take every 6 hours as needed for nausea  I will refer you to see Dr. Gala Romney, or Dr. Shirlee Latch, a cardiologist for close monitoring of your heart while receiving Herceptin.    Chemotherapy plan:  Week One: Taxotere/Carboplatin/Herceptin every three weeks with Neulasta the day after  Week Two: Herceptin   Week Three: Herceptin  Once you have received 4-6 cycles of Taxotere/Carboplatin/Herceptin the recommendation is to then change to Herceptin every three weeks to complete one calendar year of Herceptin therapy.        Docetaxel injection What is this medicine? DOCETAXEL (doe se TAX el) is a chemotherapy drug. It targets fast dividing cells, like cancer cells, and causes these cells to die. This medicine is used to treat many types of cancers like breast cancer, certain stomach cancers, head and neck cancer, lung cancer, and prostate cancer. This medicine may be used for other purposes; ask your health care provider or pharmacist if you have questions. COMMON BRAND NAME(S): Docefrez , Taxotere What should I tell my health care provider before I take this medicine? They need to know if you have any of these conditions: -infection (especially a virus infection such as chickenpox, cold sores, or herpes) -liver disease -low blood counts, like low white cell, platelet, or red cell counts -an unusual or allergic reaction to docetaxel, polysorbate 80, other chemotherapy agents, other medicines, foods, dyes, or preservatives -pregnant or trying to get pregnant -breast-feeding How should I use this medicine? This drug is given as an infusion into a vein. It is  administered in a hospital or clinic by a specially trained health care professional. Talk to your pediatrician regarding the use of this medicine in children. Special care may be needed. Overdosage: If you think you have taken too much of this medicine contact a poison control center or emergency room at once. NOTE: This medicine is only for you. Do not share this medicine with others. What if I miss a dose? It is important not to miss your dose. Call your doctor or health care professional if you are unable to keep an appointment. What may interact with this medicine? -cyclosporine -erythromycin -ketoconazole -medicines to increase blood counts like filgrastim, pegfilgrastim, sargramostim -vaccines Talk to your doctor or health care professional before taking any of these medicines: -acetaminophen -aspirin -ibuprofen -ketoprofen -naproxen This list may not describe all possible interactions. Give your health care provider a list of all the medicines, herbs, non-prescription drugs, or dietary supplements you use. Also tell them if you smoke, drink alcohol, or use illegal drugs. Some items may interact with your medicine. What should I watch for while using this medicine? Your condition will be monitored carefully while you are receiving this medicine. You will need important blood work done while you are taking this medicine. This drug may make you feel generally unwell. This is not uncommon, as chemotherapy can affect healthy cells as well as cancer cells. Report any side effects. Continue your course of treatment even though you feel ill unless your doctor tells you to stop. In some cases, you may be given additional medicines to help with side effects. Follow all directions for their use. Call your doctor or health care  professional for advice if you get a fever, chills or sore throat, or other symptoms of a cold or flu. Do not treat yourself. This drug decreases your body's ability to  fight infections. Try to avoid being around people who are sick. This medicine may increase your risk to bruise or bleed. Call your doctor or health care professional if you notice any unusual bleeding. Be careful brushing and flossing your teeth or using a toothpick because you may get an infection or bleed more easily. If you have any dental work done, tell your dentist you are receiving this medicine. Avoid taking products that contain aspirin, acetaminophen, ibuprofen, naproxen, or ketoprofen unless instructed by your doctor. These medicines may hide a fever. Do not become pregnant while taking this medicine. Women should inform their doctor if they wish to become pregnant or think they might be pregnant. There is a potential for serious side effects to an unborn child. Talk to your health care professional or pharmacist for more information. Do not breast-feed an infant while taking this medicine. What side effects may I notice from receiving this medicine? Side effects that you should report to your doctor or health care professional as soon as possible: -allergic reactions like skin rash, itching or hives, swelling of the face, lips, or tongue -low blood counts - This drug may decrease the number of white blood cells, red blood cells and platelets. You may be at increased risk for infections and bleeding. -signs of infection - fever or chills, cough, sore throat, pain or difficulty passing urine -signs of decreased platelets or bleeding - bruising, pinpoint red spots on the skin, black, tarry stools, nosebleeds -signs of decreased red blood cells - unusually weak or tired, fainting spells, lightheadedness -breathing problems -fast or irregular heartbeat -low blood pressure -mouth sores -nausea and vomiting -pain, swelling, redness or irritation at the injection site -pain, tingling, numbness in the hands or feet -swelling of the ankle, feet, hands -weight gain Side effects that usually do  not require medical attention (report to your prescriber or health care professional if they continue or are bothersome): -bone pain -complete hair loss including hair on your head, underarms, pubic hair, eyebrows, and eyelashes -diarrhea -excessive tearing -changes in the color of fingernails -loosening of the fingernails -nausea -muscle pain -red flush to skin -sweating -weak or tired This list may not describe all possible side effects. Call your doctor for medical advice about side effects. You may report side effects to FDA at 1-800-FDA-1088. Where should I keep my medicine? This drug is given in a hospital or clinic and will not be stored at home. NOTE: This sheet is a summary. It may not cover all possible information. If you have questions about this medicine, talk to your doctor, pharmacist, or health care provider.  2014, [employer]. (2008-09-02 11:52:10) Carboplatin injection What is this medicine? CARBOPLATIN (KAR boe pla tin) is a chemotherapy drug. It targets fast dividing cells, like cancer cells, and causes these cells to die. This medicine is used to treat ovarian cancer and many other cancers. This medicine may be used for other purposes; ask your health care provider or pharmacist if you have questions. COMMON BRAND NAME(S): Paraplatin What should I tell my health care provider before I take this medicine? They need to know if you have any of these conditions: -blood disorders -hearing problems -kidney disease -recent or ongoing radiation therapy -an unusual or allergic reaction to carboplatin, cisplatin, other chemotherapy, other medicines, foods, dyes, or  preservatives -pregnant or trying to get pregnant -breast-feeding How should I use this medicine? This drug is usually given as an infusion into a vein. It is administered in a hospital or clinic by a specially trained health care professional. Talk to your pediatrician regarding the use of this  medicine in children. Special care may be needed. Overdosage: If you think you have taken too much of this medicine contact a poison control center or emergency room at once. NOTE: This medicine is only for you. Do not share this medicine with others. What if I miss a dose? It is important not to miss a dose. Call your doctor or health care professional if you are unable to keep an appointment. What may interact with this medicine? -medicines for seizures -medicines to increase blood counts like filgrastim, pegfilgrastim, sargramostim -some antibiotics like amikacin, gentamicin, neomycin, streptomycin, tobramycin -vaccines Talk to your doctor or health care professional before taking any of these medicines: -acetaminophen -aspirin -ibuprofen -ketoprofen -naproxen This list may not describe all possible interactions. Give your health care provider a list of all the medicines, herbs, non-prescription drugs, or dietary supplements you use. Also tell them if you smoke, drink alcohol, or use illegal drugs. Some items may interact with your medicine. What should I watch for while using this medicine? Your condition will be monitored carefully while you are receiving this medicine. You will need important blood work done while you are taking this medicine. This drug may make you feel generally unwell. This is not uncommon, as chemotherapy can affect healthy cells as well as cancer cells. Report any side effects. Continue your course of treatment even though you feel ill unless your doctor tells you to stop. In some cases, you may be given additional medicines to help with side effects. Follow all directions for their use. Call your doctor or health care professional for advice if you get a fever, chills or sore throat, or other symptoms of a cold or flu. Do not treat yourself. This drug decreases your body's ability to fight infections. Try to avoid being around people who are sick. This medicine may  increase your risk to bruise or bleed. Call your doctor or health care professional if you notice any unusual bleeding. Be careful brushing and flossing your teeth or using a toothpick because you may get an infection or bleed more easily. If you have any dental work done, tell your dentist you are receiving this medicine. Avoid taking products that contain aspirin, acetaminophen, ibuprofen, naproxen, or ketoprofen unless instructed by your doctor. These medicines may hide a fever. Do not become pregnant while taking this medicine. Women should inform their doctor if they wish to become pregnant or think they might be pregnant. There is a potential for serious side effects to an unborn child. Talk to your health care professional or pharmacist for more information. Do not breast-feed an infant while taking this medicine. What side effects may I notice from receiving this medicine? Side effects that you should report to your doctor or health care professional as soon as possible: -allergic reactions like skin rash, itching or hives, swelling of the face, lips, or tongue -signs of infection - fever or chills, cough, sore throat, pain or difficulty passing urine -signs of decreased platelets or bleeding - bruising, pinpoint red spots on the skin, black, tarry stools, nosebleeds -signs of decreased red blood cells - unusually weak or tired, fainting spells, lightheadedness -breathing problems -changes in hearing -changes in vision -chest pain -high  blood pressure -low blood counts - This drug may decrease the number of white blood cells, red blood cells and platelets. You may be at increased risk for infections and bleeding. -nausea and vomiting -pain, swelling, redness or irritation at the injection site -pain, tingling, numbness in the hands or feet -problems with balance, talking, walking -trouble passing urine or change in the amount of urine Side effects that usually do not require medical  attention (report to your doctor or health care professional if they continue or are bothersome): -hair loss -loss of appetite -metallic taste in the mouth or changes in taste This list may not describe all possible side effects. Call your doctor for medical advice about side effects. You may report side effects to FDA at 1-800-FDA-1088. Where should I keep my medicine? This drug is given in a hospital or clinic and will not be stored at home. NOTE: This sheet is a summary. It may not cover all possible information. If you have questions about this medicine, talk to your doctor, pharmacist, or health care provider.  2014, [employer] Standard. (2007-12-26 14:38:05) Trastuzumab injection for infusion What is this medicine? TRASTUZUMAB (tras TOO zoo mab) is a monoclonal antibody. It targets a protein called HER2. This protein is found in some stomach and breast cancers. This medicine can stop cancer cell growth. This medicine may be used with other cancer treatments. This medicine may be used for other purposes; ask your health care provider or pharmacist if you have questions. COMMON BRAND NAME(S): Herceptin What should I tell my health care provider before I take this medicine? They need to know if you have any of these conditions: -heart disease -heart failure -infection (especially a virus infection such as chickenpox, cold sores, or herpes) -lung or breathing disease, like asthma -recent or ongoing radiation therapy -an unusual or allergic reaction to trastuzumab, benzyl alcohol, or other medications, foods, dyes, or preservatives -pregnant or trying to get pregnant -breast-feeding How should I use this medicine? This drug is given as an infusion into a vein. It is administered in a hospital or clinic by a specially trained health care professional. Talk to your pediatrician regarding the use of this medicine in children. This medicine is not approved for use in children. Overdosage:  If you think you have taken too much of this medicine contact a poison control center or emergency room at once. NOTE: This medicine is only for you. Do not share this medicine with others. What if I miss a dose? It is important not to miss a dose. Call your doctor or health care professional if you are unable to keep an appointment. What may interact with this medicine? -cyclophosphamide -doxorubicin -warfarin This list may not describe all possible interactions. Give your health care provider a list of all the medicines, herbs, non-prescription drugs, or dietary supplements you use. Also tell them if you smoke, drink alcohol, or use illegal drugs. Some items may interact with your medicine. What should I watch for while using this medicine? Visit your doctor for checks on your progress. Report any side effects. Continue your course of treatment even though you feel ill unless your doctor tells you to stop. Call your doctor or health care professional for advice if you get a fever, chills or sore throat, or other symptoms of a cold or flu. Do not treat yourself. Try to avoid being around people who are sick. You may experience fever, chills and shaking during your first infusion. These effects are usually mild  and can be treated with other medicines. Report any side effects during the infusion to your health care professional. Fever and chills usually do not happen with later infusions. What side effects may I notice from receiving this medicine? Side effects that you should report to your doctor or other health care professional as soon as possible: -breathing difficulties -chest pain or palpitations -cough -dizziness or fainting -fever or chills, sore throat -skin rash, itching or hives -swelling of the legs or ankles -unusually weak or tired Side effects that usually do not require medical attention (report to your doctor or other health care professional if they continue or are  bothersome): -loss of appetite -headache -muscle aches -nausea This list may not describe all possible side effects. Call your doctor for medical advice about side effects. You may report side effects to FDA at 1-800-FDA-1088. Where should I keep my medicine? This drug is given in a hospital or clinic and will not be stored at home. NOTE: This sheet is a summary. It may not cover all possible information. If you have questions about this medicine, talk to your doctor, pharmacist, or health care provider.  2014, [employer]. (2009-07-25 13:43:15)

## 2013-12-15 ENCOUNTER — Ambulatory Visit: Payer: Medicare Other

## 2013-12-16 ENCOUNTER — Encounter: Payer: Self-pay | Admitting: Adult Health

## 2013-12-17 ENCOUNTER — Encounter: Payer: Self-pay | Admitting: Oncology

## 2013-12-17 ENCOUNTER — Ambulatory Visit (HOSPITAL_BASED_OUTPATIENT_CLINIC_OR_DEPARTMENT_OTHER): Payer: Medicare Other | Admitting: Adult Health

## 2013-12-17 ENCOUNTER — Encounter: Payer: Self-pay | Admitting: Adult Health

## 2013-12-17 ENCOUNTER — Ambulatory Visit (HOSPITAL_BASED_OUTPATIENT_CLINIC_OR_DEPARTMENT_OTHER): Payer: Medicare Other

## 2013-12-17 ENCOUNTER — Telehealth: Payer: Self-pay | Admitting: *Deleted

## 2013-12-17 ENCOUNTER — Other Ambulatory Visit (HOSPITAL_BASED_OUTPATIENT_CLINIC_OR_DEPARTMENT_OTHER): Payer: Medicare Other

## 2013-12-17 VITALS — BP 127/70 | HR 63 | Temp 98.0°F | Resp 18

## 2013-12-17 VITALS — BP 159/84 | HR 87 | Temp 97.9°F | Resp 18 | Ht 68.0 in | Wt 174.2 lb

## 2013-12-17 DIAGNOSIS — Z5111 Encounter for antineoplastic chemotherapy: Secondary | ICD-10-CM

## 2013-12-17 DIAGNOSIS — Z170 Estrogen receptor positive status [ER+]: Secondary | ICD-10-CM

## 2013-12-17 DIAGNOSIS — C50412 Malignant neoplasm of upper-outer quadrant of left female breast: Secondary | ICD-10-CM

## 2013-12-17 DIAGNOSIS — C50419 Malignant neoplasm of upper-outer quadrant of unspecified female breast: Secondary | ICD-10-CM

## 2013-12-17 DIAGNOSIS — C773 Secondary and unspecified malignant neoplasm of axilla and upper limb lymph nodes: Secondary | ICD-10-CM

## 2013-12-17 DIAGNOSIS — E039 Hypothyroidism, unspecified: Secondary | ICD-10-CM

## 2013-12-17 DIAGNOSIS — I10 Essential (primary) hypertension: Secondary | ICD-10-CM

## 2013-12-17 DIAGNOSIS — Z5112 Encounter for antineoplastic immunotherapy: Secondary | ICD-10-CM

## 2013-12-17 LAB — CBC WITH DIFFERENTIAL/PLATELET
BASO%: 0.2 % (ref 0.0–2.0)
Basophils Absolute: 0 10*3/uL (ref 0.0–0.1)
EOS%: 0 % (ref 0.0–7.0)
Eosinophils Absolute: 0 10*3/uL (ref 0.0–0.5)
HCT: 39.5 % (ref 34.8–46.6)
HGB: 13.1 g/dL (ref 11.6–15.9)
LYMPH%: 6.1 % — ABNORMAL LOW (ref 14.0–49.7)
MCH: 29.5 pg (ref 25.1–34.0)
MCHC: 33.1 g/dL (ref 31.5–36.0)
MCV: 89.2 fL (ref 79.5–101.0)
MONO#: 0.5 10*3/uL (ref 0.1–0.9)
MONO%: 5.2 % (ref 0.0–14.0)
NEUT#: 8.8 10*3/uL — ABNORMAL HIGH (ref 1.5–6.5)
NEUT%: 88.5 % — ABNORMAL HIGH (ref 38.4–76.8)
Platelets: 149 10*3/uL (ref 145–400)
RBC: 4.42 10*6/uL (ref 3.70–5.45)
RDW: 14.3 % (ref 11.2–14.5)
WBC: 9.9 10*3/uL (ref 3.9–10.3)
lymph#: 0.6 10*3/uL — ABNORMAL LOW (ref 0.9–3.3)

## 2013-12-17 LAB — COMPREHENSIVE METABOLIC PANEL (CC13)
ALT: 10 U/L (ref 0–55)
AST: 10 U/L (ref 5–34)
Albumin: 4.1 g/dL (ref 3.5–5.0)
Alkaline Phosphatase: 67 U/L (ref 40–150)
Anion Gap: 11 mEq/L (ref 3–11)
BUN: 20.9 mg/dL (ref 7.0–26.0)
CO2: 22 mEq/L (ref 22–29)
Calcium: 10 mg/dL (ref 8.4–10.4)
Chloride: 105 mEq/L (ref 98–109)
Creatinine: 0.9 mg/dL (ref 0.6–1.1)
Glucose: 102 mg/dl (ref 70–140)
Potassium: 3.9 mEq/L (ref 3.5–5.1)
Sodium: 139 mEq/L (ref 136–145)
Total Bilirubin: 0.78 mg/dL (ref 0.20–1.20)
Total Protein: 7 g/dL (ref 6.4–8.3)

## 2013-12-17 MED ORDER — SODIUM CHLORIDE 0.9 % IV SOLN
Freq: Once | INTRAVENOUS | Status: AC
  Administered 2013-12-17: 12:00:00 via INTRAVENOUS

## 2013-12-17 MED ORDER — ONDANSETRON 16 MG/50ML IVPB (CHCC)
INTRAVENOUS | Status: AC
  Filled 2013-12-17: qty 16

## 2013-12-17 MED ORDER — ONDANSETRON 16 MG/50ML IVPB (CHCC)
16.0000 mg | Freq: Once | INTRAVENOUS | Status: AC
  Administered 2013-12-17: 16 mg via INTRAVENOUS

## 2013-12-17 MED ORDER — SODIUM CHLORIDE 0.9 % IV SOLN
75.0000 mg/m2 | Freq: Once | INTRAVENOUS | Status: AC
  Administered 2013-12-17: 150 mg via INTRAVENOUS
  Filled 2013-12-17: qty 15

## 2013-12-17 MED ORDER — ACETAMINOPHEN 325 MG PO TABS
ORAL_TABLET | ORAL | Status: AC
  Filled 2013-12-17: qty 2

## 2013-12-17 MED ORDER — DEXAMETHASONE SODIUM PHOSPHATE 20 MG/5ML IJ SOLN
INTRAMUSCULAR | Status: AC
  Filled 2013-12-17: qty 5

## 2013-12-17 MED ORDER — SODIUM CHLORIDE 0.9 % IV SOLN
4.0000 mg/kg | Freq: Once | INTRAVENOUS | Status: AC
  Administered 2013-12-17: 315 mg via INTRAVENOUS
  Filled 2013-12-17: qty 15

## 2013-12-17 MED ORDER — ACETAMINOPHEN 325 MG PO TABS
650.0000 mg | ORAL_TABLET | Freq: Once | ORAL | Status: AC
  Administered 2013-12-17: 650 mg via ORAL

## 2013-12-17 MED ORDER — HEPARIN SOD (PORK) LOCK FLUSH 100 UNIT/ML IV SOLN
500.0000 [IU] | Freq: Once | INTRAVENOUS | Status: AC | PRN
  Administered 2013-12-17: 500 [IU]
  Filled 2013-12-17: qty 5

## 2013-12-17 MED ORDER — DIPHENHYDRAMINE HCL 25 MG PO CAPS
50.0000 mg | ORAL_CAPSULE | Freq: Once | ORAL | Status: AC
  Administered 2013-12-17: 50 mg via ORAL

## 2013-12-17 MED ORDER — DIPHENHYDRAMINE HCL 25 MG PO CAPS
ORAL_CAPSULE | ORAL | Status: AC
  Filled 2013-12-17: qty 2

## 2013-12-17 MED ORDER — SODIUM CHLORIDE 0.9 % IJ SOLN
10.0000 mL | INTRAMUSCULAR | Status: DC | PRN
  Administered 2013-12-17: 10 mL
  Filled 2013-12-17: qty 10

## 2013-12-17 MED ORDER — DEXAMETHASONE SODIUM PHOSPHATE 20 MG/5ML IJ SOLN
20.0000 mg | Freq: Once | INTRAMUSCULAR | Status: AC
  Administered 2013-12-17: 20 mg via INTRAVENOUS

## 2013-12-17 MED ORDER — SODIUM CHLORIDE 0.9 % IV SOLN
501.0000 mg | Freq: Once | INTRAVENOUS | Status: AC
  Administered 2013-12-17: 500 mg via INTRAVENOUS
  Filled 2013-12-17: qty 50

## 2013-12-17 NOTE — Telephone Encounter (Signed)
appts made and printed. Pt is aware that tx will be added. i emailed MW to add the tx...td 

## 2013-12-17 NOTE — Progress Notes (Signed)
Kim [name] [id number] 01-29-1935 79 y.o. 12/17/2013 11:16 AM  CC  Laurey Morale, MD [address] [place] [address] Dr. 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 Excell Seltzer  PRIOR HISTORY:  Kim [name] is a 78 y.o. female.   1. Who presented for a screening mammogram 6 months ago that revealed a small area of calcifications in the upper-outer quadrant of the left breast. She was recommended a short term followup mammogram which was performed in January 2015. The mammogram revealed slight increase in the number of calcifications. Because of this she had a stereotactic biopsy performed on 10/18/2013. The pathology revealed a ductal carcinoma in situ. The tumor was ER positive PR positive. Measuring 1cm. Patient had MRI of the breasts performed which did reveal abnormal left axillary lymph nodes. Patient was seen by Dr. Excell Seltzer who recommended partial left mastectomy with sentinel lymph node biopsy/dissection. Patient went on to have lumpectomy with lymph node dissection performed on 11/05/2013. The final pathology revealed 1.5 cm ductal carcinoma in situ with intravascular carcinoma 4 of 10 lymph nodes were positive for metastatic disease. The tumor was ER positive PR positive.  HER-2/neu and Ki-67 were not performed.   2.  Patient had HER-2/neu positive results from pathology on 11/06/13.  Patient underwent PET/CT on 12/12/13 that was negative for metastatic disease.    CURRENT THERAPY: [facility] therapy with weekly herceptin to begin on 12/17/13  INTERVAL HISTORY:  The patient is here for follow up and evaluation prior to receiving day 1 cycle 1 of 4-6 planned q. three-week doses of docetaxel/carboplatin/trastuzumab, being given in the adjuvant setting. Patient receives docetaxel/carboplatin on day 1 of each 21 day cycle, Neulasta on day 2, and trastuzumab alone on days 8 and 15.  She is due for [facility] today.  She would like some clarification on her nausea medications and her chemotherapy schedule.  She is doing well today.  She took the Dexamethasone yesterday as prescribed.  She denies fevers, chills, nausea, vomiting, constipation, diarrhea, numbness, or any further concerns.     Past Medical History: Past Medical History  Diagnosis Date  . Allergy   . Allergic rhinitis   . Hypothyroidism   . Urinary tract infection   . Diverticulosis   . Hx of adenomatous colonic polyps 02/1999  . Tubulovillous adenoma of colon 12/2009    with HGD  . Low back pain     gets ESI from Dr. Rennis Harding  . Arthritis   . Hypertension   . HOH (hard of hearing)   . Wears hearing aid   . Breast cancer of upper-outer quadrant of left female breast 11/08/13    Past Surgical History: Past Surgical History  Procedure Laterality Date  . Abdominal  hysterectomy    . Bilateral salpingoophorectomy      see Laurin Coder NP for GYN exams  . Cataract extraction w/ intraocular lens  implant, bilateral  2012    bilateral  . Colonoscopy    . Breast lumpectomy with needle localization and axillary lymph node dissection Left 11/05/2013    Procedure: LEFT BREAST LUMPECTOMY WITH NEEDLE LOCALIZATION and axillary lymph Node Dissection;  Surgeon: Edward Jolly, MD;  Location: [place];   Service: General;  Laterality: Left;  . Breast surgery    . Portacath placement Right 12/11/2013    Procedure: INSERTION PORT-A-CATH;  Surgeon: Edward Jolly, MD;  Location: [place];  Service: General;  Laterality: Right;  Subclavian Vein; REMOVAL OF JP DRAIN LEFT AXILLA    Family History: Family History  Problem Relation Age of Onset  . Alcohol abuse Father   . Kidney failure Mother   . Colon cancer      family    Social History History  Substance Use Topics  . Smoking status: Never Smoker   . Smokeless tobacco: Never Used  . Alcohol Use: No    Allergies: Allergies  Allergen Reactions  . Propoxyphene N-Acetaminophen Swelling    tongue swelling  . Penicillins Swelling    Facial swelling  . Cortisone Rash  . Pseudoephedrine Hcl Er Rash and Other (See Comments)    Face gets red    Current Medications: Current Outpatient Prescriptions  Medication Sig Dispense Refill  . acetaminophen (TYLENOL) 650 MG CR tablet Take 650 mg by mouth every 8 (eight) hours as needed.      . benazepril (LOTENSIN) 10 MG tablet Take 1 tablet (10 mg total) by mouth daily.  30 tablet  10  . Calcium Citrate-Vitamin D (CITRACAL MAXIMUM PO) Take by mouth.        . cetirizine (ZYRTEC) 10 MG tablet Take 10 mg by mouth daily as needed for allergies.      Marland Kitchen dexamethasone (DECADRON) 4 MG tablet Take 4 mg by mouth 2 (two) times daily with a meal.      . HYDROcodone-acetaminophen (NORCO) 5-325 MG per tablet Take 1-2 tablets by mouth every 6 (six) hours as needed.  30 tablet  0  . levothyroxine (SYNTHROID, LEVOTHROID) 125 MCG tablet TAKE 1 TABLET BY MOUTH ONCE A DAY  30 tablet  4  . lidocaine-prilocaine (EMLA) cream Apply topically as needed.  30 g  6  . Multiple Vitamin (MULTIVITAMIN) tablet Take 1 tablet by mouth daily.        . ondansetron (ZOFRAN) 8 MG tablet Take 8 mg by mouth every 8 (eight) hours as needed for nausea or vomiting.      . prochlorperazine (COMPAZINE) 10 MG tablet  Take 10 mg by mouth every 6 (six) hours as needed for nausea or vomiting.      . [DISCONTINUED] ferrous sulfate 325 (65 FE) MG tablet Take 325 mg by mouth 2 (two) times daily.         No current facility-administered medications for this visit.         REVIEW OF SYSTEMS: A 10 point review of systems was conducted and is otherwise negative except for what is noted above.     PHYSICAL EXAMINATION: Blood pressure 159/84, pulse 87, temperature 97.9 F (36.6 C), temperature source Oral, resp. rate 18, height $RemoveBe'5\' 8"'mUPCLEZvK$  (1.727 m), weight 174 lb 3.2 oz (79.017 kg). GENERAL: Patient is a well appearing female in no acute distress.  Hard of hearing. HEENT:  Sclerae anicteric.  Oropharynx clear and moist. No ulcerations or evidence of oropharyngeal candidiasis. Neck is supple.  NODES:  No cervical, supraclavicular, or axillary lymphadenopathy palpated.  BREAST EXAM:  Left lumpectomy site and axillary scars are healing well, no redness or sign of infection.   LUNGS:  Clear to auscultation bilaterally.  No wheezes or rhonchi. HEART:  Regular rate and rhythm. No murmur appreciated. ABDOMEN:  Soft, nontender.  Positive, normoactive bowel sounds. No organomegaly palpated. MSK:  No focal spinal tenderness to palpation. Full range of motion bilaterally in the upper extremities. EXTREMITIES:  No peripheral edema.   SKIN:  Clear with no obvious rashes or skin changes. No nail dyscrasia.  Port covered with emla cream. NEURO:  Nonfocal. Well oriented.  Appropriate affect. ECOG PERFORMANCE STATUS: 1 - Symptomatic but completely ambulatory  STUDIES/RESULTS: Mr Breast Bilateral W Wo Contrast  10/26/2013   CLINICAL DATA:  79 year old female with recently diagnosed DCIS of the left breast post stereotactic biopsy of a cluster of calcifications measuring up to 1.4 cm.  EXAM: BILATERAL BREAST MRI WITH AND WITHOUT CONTRAST  LABS:  BUN and creatinine were obtained on site at [facility] at  [address]   Results:  BUN 19 mg/dL,  Creatinine 0.9 mg/dL.  TECHNIQUE: Multiplanar, multisequence MR images of both breasts were obtained prior to and following the intravenous administration of 71ml of MultiHance.  THREE-DIMENSIONAL MR IMAGE RENDERING ON INDEPENDENT WORKSTATION:  Three-dimensional MR images were rendered by post-processing of the original MR data on an independent workstation. The three-dimensional MR images were interpreted, and findings are reported in the following complete MRI report for this study. Three dimensional images were evaluated at the independent DynaCad workstation  COMPARISON:  Previous exams  FINDINGS: Breast composition: b.  Scattered fibroglandular tissue.  Background parenchymal enhancement: Mild.  Right breast: No suspicious rapidly enhancing masses or abnormal areas of enhancement are seen in the right breast to suggest malignancy.  Left breast: Rim enhancing post biopsy hematoma containing clip artifact is present in the upper-outer left breast. This measures 1.1 cm AP, 1.1 cm transverse, and up to 3.3 cm craniocaudal. No additional suspicious rapidly enhancing masses or abnormal areas of enhancement are seen in the left breast although patient motion mildly limits evaluation.  Lymph nodes: A rounded morphologically abnormal lymph node is present in the left axilla measuring up to 1.2 cm.  Ancillary findings: A splenule is incidentally seen along the lateral margin of the spleen.  IMPRESSION: 1. Biopsy proven DCIS in the upper-outer left breast, within associated rim enhancing hematoma measuring up to 3.3 cm craniocaudal. No definite additional sites of disease are seen in the left breast, although motion mildly limits evaluation.  2.  Enlarged morphologically abnormal lymph node in the left axilla.  3.  No MRI evidence of malignancy in the right breast.  RECOMMENDATION: 1. A second-look ultrasound (with possible biopsy) of the left axilla is recommended for further  evaluation/characterization of the morphologically abnormal lymph node seen on MRI.  2.  Treatment plan for known left breast malignancy.  BI-RADS CATEGORY  6: Known biopsy-proven malignancy - appropriate action should be taken.   Electronically Signed   By: Everlean Alstrom M.D.   On: 10/26/2013 14:35   Mm Digital Diagnostic Unilat R  10/26/2013   CLINICAL DATA:  79 year old female with recently diagnosed DCIS of the left breast. Screening mammogram of the right breast.  EXAM: DIGITAL DIAGNOSTIC  RIGHT MAMMOGRAM WITH CAD  COMPARISON:  Previous exams.  ACR Breast Density Category b: There are scattered areas of fibroglandular density.  FINDINGS: No suspicious masses or calcifications are seen in the right breast. There is no mammographic evidence of malignancy in the right breast.  Mammographic images were processed with CAD.  IMPRESSION: No mammographic evidence of malignancy in the right breast.  RECOMMENDATION: 1.  Treatment plan for known left breast malignancy.  2. Screening mammogram in one year.(Code:[id number]-B-01Y)  I have discussed the findings and recommendations with the patient. Results were also provided in writing at the conclusion of the visit. If applicable, a reminder letter will be sent to the patient regarding the next appointment.  BI-RADS CATEGORY  1: Negative.   Electronically Signed   By: Everlean Alstrom M.D.   On: 10/26/2013 13:51   Mm Lt Breast Bx W Loc Dev 1st Lesion Image Bx Spec Stereo Guide  10/19/2013   CLINICAL DATA:  79 year old female for tissue sampling of developing calcifications in the upper outer left breast  EXAM: LEFT STEREOTACTIC CORE NEEDLE BIOPSY  COMPARISON:  Previous exams.  FINDINGS: The patient and I discussed the procedure of stereotactic-guided biopsy including benefits and alternatives. We discussed the high likelihood of a successful procedure. We discussed the risks of the procedure including infection, bleeding, tissue injury, clip migration, and inadequate  sampling. Informed written consent was given. The usual time out protocol was performed immediately prior to the procedure.  Using sterile technique and 2% Lidocaine as local anesthetic, under stereotactic guidance, a 9 gauge vacuum assisted needle device was used to perform core needle biopsy of calcifications in the upper outer left breast using a superior approach. Specimen radiograph was performed showing calcifications. Specimens with calcifications are identified for pathology.  At the conclusion of the procedure, a ribbon shaped tissue marker clip was deployed into the biopsy cavity. Follow-up 2-view mammogram confirmed clip placement to be satisfactory.  IMPRESSION: Stereotactic-guided biopsy of left breast calcifications. No apparent complications.  Final pathology demonstrates DCIS.  Histology correlates with imaging findings.  The patient was contacted by phone on 10/19/2013. and these results given to her which she understood. Her questions were answered.  The patient had no complaints with her biopsy site. .  Recommend surgery/oncology consultation. An appointment with Dr. Excell Seltzer [facility] surgery) has been scheduled for 10/26/2013 and the patient informed.  Recommend breast MRI. The patient will be contacted by our office with her appointment.   Electronically Signed   By: Hassan Rowan M.D.   On: 10/19/2013 12:50   [place] Lt Breast Bx W Loc Dev 1st Lesion Img Bx Spec US Guide  11/01/2013   ADDENDUM REPORT: 11/01/2013 15:47  ADDENDUM: Pathology results: Pathology results from the left axillary lymph node biopsy revealed metastatic invasive mammary carcinoma. This is concordant with the imaging findings. The patient has been notified of the results. She is doing well and denies any biopsy site complications.  Treatment plan for her known left breast cancer is recommended. The patient has been instructed to call the [facility] [place] with any questions or concerns.   Electronically Signed   By: Everlean Alstrom M.D.   On: 11/01/2013 15:47   11/01/2013   CLINICAL DATA:  79 year old female with recently diagnosed ductal carcinoma in situ of the left breast. Morphologically abnormal lymph nodes seen in the left axilla on recent breast MRI.  EXAM: ULTRASOUND GUIDED LEFT BREAST (AXILLA) CORE NEEDLE BIOPSY WITH VACUUM ASSIST  COMPARISON:  Previous exams.  PROCEDURE: I met with  the patient and we discussed the procedure of ultrasound-guided biopsy, including benefits and alternatives. We discussed the high likelihood of a successful procedure. We discussed the risks of the procedure including infection, bleeding, tissue injury, clip migration, and inadequate sampling. Informed written consent was given. The usual time-out protocol was performed immediately prior to the procedure.  Using sterile technique and 2% Lidocaine as local anesthetic, under direct ultrasound visualization, a 12 gauge vacuum-assisteddevice was used to perform biopsy of a morphologically abnormal lymph node in the low left axilla using a lateral approach.  IMPRESSION: Ultrasound-guided biopsy of a morphologically abnormal lymph node in the left axilla. No apparent complications.  Electronically Signed: By: Everlean Alstrom M.D. On: 10/31/2013 10:55   Mm Lt Plc Breast Loc Dev   1st Lesion  Inc Mammo Guide  11/05/2013   CLINICAL DATA:  Preoperative localization for surgical excision of the left breast DCIS.  EXAM: NEEDLE LOCALIZATION OF THE left BREAST WITH MAMMO GUIDANCE  COMPARISON:  Previous exams.  FINDINGS: Patient presents for needle localization prior to surgical excision of left breast DCIS. I met with the patient and we discussed the procedure of needle localization including benefits and alternatives. We discussed the high likelihood of a successful procedure. We discussed the risks of the procedure, including infection, bleeding, tissue injury, and further surgery. Informed, written consent was given. The usual time-out protocol was  performed immediately prior to the procedure.  Using mammographic guidance, sterile technique, 2% lidocaine and a 5 cm modified Kopans needle, the clip was localized using lateral approach. The films were marked for Dr. Excell Seltzer.  Specimen radiograph was performed at surgery and confirms the clip an intact wire to be present in the tissue sample. The specimen was marked for pathology.  IMPRESSION: Needle localization of the left breast. No apparent complications.   Electronically Signed   By: Luberta Robertson M.D.   On: 11/05/2013 17:12     LABS:    Chemistry      Component Value Date/Time   NA 139 12/17/2013 1028   NA 138 11/02/2013 1230   K 3.9 12/17/2013 1028   K 4.2 11/02/2013 1230   CL 101 11/02/2013 1230   CO2 22 12/17/2013 1028   CO2 26 11/02/2013 1230   BUN 20.9 12/17/2013 1028   BUN 20 11/02/2013 1230   CREATININE 0.9 12/17/2013 1028   CREATININE 0.95 11/02/2013 1230      Component Value Date/Time   CALCIUM 10.0 12/17/2013 1028   CALCIUM 9.2 11/02/2013 1230   ALKPHOS 67 12/17/2013 1028   ALKPHOS 68 05/18/2012 0923   AST 10 12/17/2013 1028   AST 11 05/18/2012 0923   ALT 10 12/17/2013 1028   ALT 11 05/18/2012 0923   BILITOT 0.78 12/17/2013 1028   BILITOT 0.8 05/18/2012 0923      Lab Results  Component Value Date   WBC 9.9 12/17/2013   HGB 13.1 12/17/2013   HCT 39.5 12/17/2013   MCV 89.2 12/17/2013   PLT 149 12/17/2013   PATHOLOGY Diagnosis 1. Breast, lumpectomy, Left - DUCTAL CARCINOMA IN SITU, 1.5 CM, ASSOCIATED WITH BIOPSY SITE REACTION. - MARGINS NOT INVOLVED BY DUCTAL CARCINOMA IN SITU. - SCATTERED FOCI OF INTRAVASCULAR CARCINOMA. - SEE ONCOLOGY TABLE AND COMMENT. 2. Breast, excision, Left further inferior margin - FIBROCYSTIC CHANGES. - NO MALIGNANCY IDENTIFIED. 3. Lymph nodes, regional resection, Left axillary contents - METASTATIC CARCINOMA IN FOUR OF TEN LYMPH NODES (4/10). Microscopic Comment 1. BREAST, INVASIVE TUMOR, WITH LYMPH NODE SAMPLING Specimen, including  laterality and lymph node sampling (sentinel, non-sentinel): Left breast and axillary lymph nodes Procedure: Lumpectomy with axillary lymph nodes Histologic type: See comment Grade: II Tubule formation: 3 Nuclear pleomorphism: 2 Mitotic: 2 Tumor size (gross measurement): 1.5 cm ductal carcinoma in situ, see comment Margins: Free of tumor Invasive, distance to closest margin: Intravascular tumor focally less than 0.1 cm from medial margin In-situ, distance to closest margin: 0.3 cm from anterior margin If margin positive, focally or broadly: N/A Lymphovascular invasion: Present, scattered foci, see comment. Ductal carcinoma in situ: Present Grade: High grade Extensive intraductal component: N/A Lobular neoplasia: No Tumor focality: See comment Treatment effect: No 1 of 3 FINAL for BERDINE, RASMUSSON ([id number]) Microscopic Comment(continued) If present, treatment effect in breast tissue, lymph nodes or both: N/A Extent of tumor: Skin: N/A Nipple: N/A Skeletal muscle: Free of tumor Lymph nodes: Examined: 0 Sentinel 10 Non-sentinel 10 Total Lymph nodes with metastasis: 4 Isolated tumor cells (< 0.2 mm): 0 Micrometastasis: (> 0.2 mm and < 2.0 mm): 0 Macrometastasis: (> 2.0 mm): 4 Extracapsular extension: Present Breast prognostic profile: Case [id number] Estrogen receptor: 100%, strong staining Progesterone receptor: 4%, positive Her 2 neu: N/A Ki-67: N/A Non-neoplastic breast: Fibrocystic changes with focal pseudoangiomatous stromal hyperplasia TNM: See comment Comments: In the area of localization there is biopsy site reaction and several foci of residual ductal carcinoma in situ, estimated at 1.5 cm. There are also scattered foci within the specimen demonstrating lymphatic vascular involvement by carcinoma which focally is less than 0.1 cm from the medial margin of the specimen. Multiple additional sections of the specimen are submitted and show focal lymphatic vascular  involvement by carcinoma. No invasive carcinoma is identified. There are ten lymph nodes isolated and four show metastatic carcinoma and in several of the involved nodes there is lymphatic vascular involvement by tumor adjacent to the involved nodes. The case was discussed with Dr. Excell Seltzer on 11/08/13. (JDP:kh 11/08/13) Claudette Laws MD Pathologist, Electronic Signature (Case signed 11/08/2013)  ASSESSMENT/PLAN   79 year old female with  #1 stage III (Tis N2) ductal carcinoma with intravascular carcinoma and 4 of 10 positive lymph nodes. Tumor ER positive PR positive and now HER-2neu positive.    #2 The negative results of the PET/CT on 12/12/2013 along with the LVEF from the echocardiogram on 12/07/2013 have been discussed with the patient in detail.    #3  I again reviewed her chemotherapy regimen with her in detail, along with how to take her anti-emetics beginning tomorrow.  I again reviewed the rationale for Herceptin and what Her-2 positivity means.  I also gave her information and discussed the role of Neulasta in her treatment plan.  I recommended she take Tylenol $RemoveBefo'650mg'TzYVauDDSlB$ , and Claritin $RemoveBefo'10mg'EUUsTCzzqRw$  daily for five days beginning tomorrow morning.  All of this information was given to the patient in her AVS in detail.  She and her husband verbalized understanding.  They know to call if she develops bone pain that becomes uncontrolled and we will prescribe a stronger pain medication.    #4  The patient will return tomorrow for Neulasta and in one week for labs, evaluation, and Herceptin therapy.    All questions were answered. The patient knows to call the clinic with any problems, questions or concerns. We can certainly see the patient much sooner if necessary.  I spent 25 minutes counseling the patient face to face. The total time spent in the appointment was 30 minutes.  Minette Headland, [place] [phone]  12/17/2013, 11:16 AM

## 2013-12-17 NOTE — Patient Instructions (Signed)
Take Tylenol 650mg  and Claritin/Loratidine 10mg  daily for five days starting tomorrow, December 18, 2013.  If you have severe bone pain despite this regimen, please call our office and we will prescribe something stronger.       Pegfilgrastim injection What is this medicine? PEGFILGRASTIM (peg fil GRA stim) helps the body make more white blood cells. It is used to prevent infection in people with low amounts of white blood cells following cancer treatment. This medicine may be used for other purposes; ask your health care provider or pharmacist if you have questions. COMMON BRAND NAME(S): Neulasta What should I tell my health care provider before I take this medicine? They need to know if you have any of these conditions: -sickle cell disease -an unusual or allergic reaction to pegfilgrastim, filgrastim, E.coli protein, other medicines, foods, dyes, or preservatives -pregnant or trying to get pregnant -breast-feeding How should I use this medicine? This medicine is for injection under the skin. It is usually given by a health care professional in a hospital or clinic setting. If you get this medicine at home, you will be taught how to prepare and give this medicine. Do not shake this medicine. Use exactly as directed. Take your medicine at regular intervals. Do not take your medicine more often than directed. It is important that you put your used needles and syringes in a special sharps container. Do not put them in a trash can. If you do not have a sharps container, call your pharmacist or healthcare provider to get one. Talk to your pediatrician regarding the use of this medicine in children. While this drug may be prescribed for children who weigh more than 45 kg for selected conditions, precautions do apply Overdosage: If you think you have taken too much of this medicine contact a poison control center or emergency room at once. NOTE: This medicine is only for you. Do not share this  medicine with others. What if I miss a dose? If you miss a dose, take it as soon as you can. If it is almost time for your next dose, take only that dose. Do not take double or extra doses. What may interact with this medicine? -lithium -medicines for growth therapy This list may not describe all possible interactions. Give your health care provider a list of all the medicines, herbs, non-prescription drugs, or dietary supplements you use. Also tell them if you smoke, drink alcohol, or use illegal drugs. Some items may interact with your medicine. What should I watch for while using this medicine? Visit your doctor for regular check ups. You will need important blood work done while you are taking this medicine. What side effects may I notice from receiving this medicine? Side effects that you should report to your doctor or health care professional as soon as possible: -allergic reactions like skin rash, itching or hives, swelling of the face, lips, or tongue -breathing problems -fever -pain, redness, or swelling where injected -shoulder pain -stomach or side pain Side effects that usually do not require medical attention (report to your doctor or health care professional if they continue or are bothersome): -aches, pains -headache -loss of appetite -nausea, vomiting -unusually tired This list may not describe all possible side effects. Call your doctor for medical advice about side effects. You may report side effects to FDA at 1-800-FDA-1088. Where should I keep my medicine? Keep out of the reach of children. Store in a refrigerator between 2 and 8 degrees C (36 and 46 degrees  F). Do not freeze. Keep in carton to protect from light. Throw away this medicine if it is left out of the refrigerator for more than 48 hours. Throw away any unused medicine after the expiration date. NOTE: This sheet is a summary. It may not cover all possible information. If you have questions about this  medicine, talk to your doctor, pharmacist, or health care provider.  2014, Elsevier/Gold Standard. (2008-04-22 15:41:44)

## 2013-12-17 NOTE — Telephone Encounter (Signed)
Lm informed the pt that her appt time has changed for 01/07/14. gv appt for labs@ 9:45am, ov@ 10:15am with tx to follow. Made the pt aware that i will mail a letter/avs..td

## 2013-12-17 NOTE — Patient Instructions (Addendum)
[facility] Discharge Instructions for Patients Receiving Chemotherapy  Today you received the following chemotherapy agents: Taxotere, Carboplatin and Herceptin.  To help prevent nausea and vomiting after your treatment, we encourage you to take your nausea medication, Zofran (Ondansetron). Take one twice daily for the next three days. Use Compazine every six hours as needed (may make you drowsy). Continue Decadron (Dexamethasone) twice daily as prescribed.    If you develop nausea and vomiting that is not controlled by your nausea medication, call the clinic.   BELOW ARE SYMPTOMS THAT SHOULD BE REPORTED IMMEDIATELY:  *FEVER GREATER THAN 100.5 F  *CHILLS WITH OR WITHOUT FEVER  NAUSEA AND VOMITING THAT IS NOT CONTROLLED WITH YOUR NAUSEA MEDICATION  *UNUSUAL SHORTNESS OF BREATH  *UNUSUAL BRUISING OR BLEEDING  TENDERNESS IN MOUTH AND THROAT WITH OR WITHOUT PRESENCE OF ULCERS  *URINARY PROBLEMS  *BOWEL PROBLEMS  UNUSUAL RASH Items with * indicate a potential emergency and should be followed up as soon as possible.  Feel free to call the clinic should you have any questions or concerns. The clinic phone number is [phone].

## 2013-12-18 ENCOUNTER — Telehealth: Payer: Self-pay | Admitting: Cardiology

## 2013-12-18 ENCOUNTER — Telehealth: Payer: Self-pay | Admitting: *Deleted

## 2013-12-18 ENCOUNTER — Ambulatory Visit (HOSPITAL_BASED_OUTPATIENT_CLINIC_OR_DEPARTMENT_OTHER): Payer: Medicare Other

## 2013-12-18 VITALS — BP 125/55 | HR 79 | Temp 98.3°F

## 2013-12-18 DIAGNOSIS — C50412 Malignant neoplasm of upper-outer quadrant of left female breast: Secondary | ICD-10-CM

## 2013-12-18 DIAGNOSIS — C773 Secondary and unspecified malignant neoplasm of axilla and upper limb lymph nodes: Secondary | ICD-10-CM

## 2013-12-18 DIAGNOSIS — Z5189 Encounter for other specified aftercare: Secondary | ICD-10-CM

## 2013-12-18 DIAGNOSIS — C50419 Malignant neoplasm of upper-outer quadrant of unspecified female breast: Secondary | ICD-10-CM

## 2013-12-18 MED ORDER — PEGFILGRASTIM INJECTION 6 MG/0.6ML
6.0000 mg | Freq: Once | SUBCUTANEOUS | Status: AC
  Administered 2013-12-18: 6 mg via SUBCUTANEOUS
  Filled 2013-12-18: qty 0.6

## 2013-12-18 NOTE — Telephone Encounter (Signed)
Pt called because she has an appointment with Dr. Aundra Dubin on 02/20/14, she  would like to have a sooner appointment. Pt is Dr. Camillia Herter pt she was  seen in his clinic on  06/15/12 . Pt states she is a cancer pt and [facility] oncology assigned her to Dr. Aundra Dubin, also states, she  would like to see Dr. Aundra Dubin because Dr. Aundra Dubin is her husband's doctor.

## 2013-12-18 NOTE — Telephone Encounter (Signed)
Kim [name] here for Neulasta injection following 1st [facility] chemotherapy.  States that she is having some diarrhea which started at 5:00 today.  She took Imodium and had another stool an hour later and she repeated the Imodium.  Has not had any more stools since then, but stomach is still rolling.  Told her that she was taking the Imodium correctly and to follow the directions on the box.  She is drinking lots of fluids.  No other complaints.  All questions answered.  Knows to call if she has any other problems or concerns.

## 2013-12-18 NOTE — Telephone Encounter (Signed)
New Message:  Pt is requesting to be worked in sooner w/ Dr. Aundra Dubin. Pt would like a call back from the nurse.

## 2013-12-18 NOTE — Patient Instructions (Signed)

## 2013-12-19 ENCOUNTER — Other Ambulatory Visit: Payer: Self-pay

## 2013-12-19 ENCOUNTER — Encounter (HOSPITAL_COMMUNITY): Payer: Self-pay | Admitting: Emergency Medicine

## 2013-12-19 ENCOUNTER — Inpatient Hospital Stay (HOSPITAL_COMMUNITY)
Admission: EM | Admit: 2013-12-19 | Discharge: 2013-12-21 | DRG: 247 | Disposition: A | Discharge status: Home / Self Care | Payer: Medicare Other | Attending: Cardiology | Admitting: Cardiology

## 2013-12-19 ENCOUNTER — Encounter (HOSPITAL_COMMUNITY)
Admission: EM | Disposition: A | Discharge status: Home / Self Care | Payer: Self-pay | Source: Home / Self Care | Attending: Cardiology

## 2013-12-19 ENCOUNTER — Emergency Department (HOSPITAL_COMMUNITY): Payer: Medicare Other

## 2013-12-19 ENCOUNTER — Telehealth: Payer: Self-pay | Admitting: *Deleted

## 2013-12-19 DIAGNOSIS — C50412 Malignant neoplasm of upper-outer quadrant of left female breast: Secondary | ICD-10-CM | POA: Diagnosis present

## 2013-12-19 DIAGNOSIS — I200 Unstable angina: Secondary | ICD-10-CM | POA: Diagnosis present

## 2013-12-19 DIAGNOSIS — I249 Acute ischemic heart disease, unspecified: Secondary | ICD-10-CM

## 2013-12-19 DIAGNOSIS — Z170 Estrogen receptor positive status [ER+]: Secondary | ICD-10-CM | POA: Diagnosis present

## 2013-12-19 DIAGNOSIS — E785 Hyperlipidemia, unspecified: Secondary | ICD-10-CM | POA: Diagnosis present

## 2013-12-19 DIAGNOSIS — I2510 Atherosclerotic heart disease of native coronary artery without angina pectoris: Secondary | ICD-10-CM

## 2013-12-19 DIAGNOSIS — R011 Cardiac murmur, unspecified: Secondary | ICD-10-CM | POA: Diagnosis present

## 2013-12-19 DIAGNOSIS — I421 Obstructive hypertrophic cardiomyopathy: Secondary | ICD-10-CM | POA: Diagnosis present

## 2013-12-19 DIAGNOSIS — C50919 Malignant neoplasm of unspecified site of unspecified female breast: Secondary | ICD-10-CM | POA: Diagnosis present

## 2013-12-19 DIAGNOSIS — E039 Hypothyroidism, unspecified: Secondary | ICD-10-CM | POA: Diagnosis present

## 2013-12-19 DIAGNOSIS — I10 Essential (primary) hypertension: Secondary | ICD-10-CM | POA: Diagnosis present

## 2013-12-19 HISTORY — DX: Hyperlipidemia, unspecified: E78.5

## 2013-12-19 HISTORY — PX: LEFT HEART CATHETERIZATION WITH CORONARY ANGIOGRAM: SHX5451

## 2013-12-19 HISTORY — DX: Atherosclerotic heart disease of native coronary artery without angina pectoris: I25.10

## 2013-12-19 LAB — CBC WITH DIFFERENTIAL/PLATELET
Basophils Absolute: 0 10*3/uL (ref 0.0–0.1)
Basophils Relative: 0 % (ref 0–1)
Eosinophils Absolute: 0 10*3/uL (ref 0.0–0.7)
Eosinophils Relative: 0 % (ref 0–5)
HCT: 35.6 % — ABNORMAL LOW (ref 36.0–46.0)
Hemoglobin: 12.1 g/dL (ref 12.0–15.0)
Lymphocytes Relative: 1 % — ABNORMAL LOW (ref 12–46)
Lymphs Abs: 0.3 10*3/uL — ABNORMAL LOW (ref 0.7–4.0)
MCH: 30 pg (ref 26.0–34.0)
MCHC: 34 g/dL (ref 30.0–36.0)
MCV: 88.3 fL (ref 78.0–100.0)
Monocytes Absolute: 0.6 10*3/uL (ref 0.1–1.0)
Monocytes Relative: 2 % — ABNORMAL LOW (ref 3–12)
Neutro Abs: 30.5 10*3/uL — ABNORMAL HIGH (ref 1.7–7.7)
Neutrophils Relative %: 97 % — ABNORMAL HIGH (ref 43–77)
Platelets: 127 10*3/uL — ABNORMAL LOW (ref 150–400)
RBC: 4.03 MIL/uL (ref 3.87–5.11)
RDW: 13.7 % (ref 11.5–15.5)
WBC: 31.4 10*3/uL — ABNORMAL HIGH (ref 4.0–10.5)

## 2013-12-19 LAB — COMPREHENSIVE METABOLIC PANEL
ALT: 36 U/L — ABNORMAL HIGH (ref 0–35)
AST: 17 U/L (ref 0–37)
Albumin: 3.5 g/dL (ref 3.5–5.2)
Alkaline Phosphatase: 59 U/L (ref 39–117)
BUN: 30 mg/dL — ABNORMAL HIGH (ref 6–23)
CO2: 21 mEq/L (ref 19–32)
Calcium: 8.6 mg/dL (ref 8.4–10.5)
Chloride: 99 mEq/L (ref 96–112)
Creatinine, Ser: 1 mg/dL (ref 0.50–1.10)
GFR calc Af Amer: 61 mL/min — ABNORMAL LOW (ref >90)
GFR calc non Af Amer: 53 mL/min — ABNORMAL LOW (ref >90)
Glucose, Bld: 111 mg/dL — ABNORMAL HIGH (ref 70–99)
Potassium: 4.1 mEq/L (ref 3.7–5.3)
Sodium: 134 mEq/L — ABNORMAL LOW (ref 137–147)
Total Bilirubin: 1 mg/dL (ref 0.3–1.2)
Total Protein: 6.1 g/dL (ref 6.0–8.3)

## 2013-12-19 LAB — TROPONIN I: Troponin I: <0.3 ng/mL (ref <0.30)

## 2013-12-19 LAB — I-STAT TROPONIN, ED: Troponin i, poc: 0.02 ng/mL (ref 0.00–0.08)

## 2013-12-19 LAB — POCT ACTIVATED CLOTTING TIME: Activated Clotting Time: 249 seconds

## 2013-12-19 LAB — APTT: aPTT: 22 seconds — ABNORMAL LOW (ref 24–37)

## 2013-12-19 LAB — PROTIME-INR
INR: 1.02 (ref 0.00–1.49)
Prothrombin Time: 13.2 seconds (ref 11.6–15.2)

## 2013-12-19 SURGERY — LEFT HEART CATHETERIZATION WITH CORONARY ANGIOGRAM
Anesthesia: LOCAL

## 2013-12-19 MED ORDER — ATORVASTATIN CALCIUM 20 MG PO TABS
20.0000 mg | ORAL_TABLET | Freq: Every day | ORAL | Status: DC
  Administered 2013-12-20: 20 mg via ORAL
  Filled 2013-12-19 (×2): qty 1

## 2013-12-19 MED ORDER — NITROGLYCERIN 0.2 MG/ML ON CALL CATH LAB
INTRAVENOUS | Status: AC
  Filled 2013-12-19: qty 1

## 2013-12-19 MED ORDER — ASPIRIN 325 MG PO TABS
325.0000 mg | ORAL_TABLET | Freq: Once | ORAL | Status: AC
  Administered 2013-12-19: 325 mg via ORAL
  Filled 2013-12-19: qty 1

## 2013-12-19 MED ORDER — LEVOTHYROXINE SODIUM 125 MCG PO TABS
125.0000 ug | ORAL_TABLET | Freq: Every day | ORAL | Status: DC
  Administered 2013-12-21: 09:00:00 125 ug via ORAL
  Filled 2013-12-19 (×3): qty 1

## 2013-12-19 MED ORDER — VERAPAMIL HCL 2.5 MG/ML IV SOLN
INTRAVENOUS | Status: AC
  Filled 2013-12-19: qty 2

## 2013-12-19 MED ORDER — NITROGLYCERIN 0.4 MG SL SUBL: 0.4000 mg | SUBLINGUAL_TABLET | SUBLINGUAL | Status: DC | PRN

## 2013-12-19 MED ORDER — FENTANYL CITRATE 0.05 MG/ML IJ SOLN
INTRAMUSCULAR | Status: AC
  Filled 2013-12-19: qty 2

## 2013-12-19 MED ORDER — HEPARIN (PORCINE) IN NACL 2-0.9 UNIT/ML-% IJ SOLN
INTRAMUSCULAR | Status: AC
  Filled 2013-12-19: qty 2000

## 2013-12-19 MED ORDER — HEPARIN (PORCINE) IN NACL 100-0.45 UNIT/ML-% IJ SOLN
950.0000 [IU]/h | INTRAMUSCULAR | Status: DC
  Administered 2013-12-19: 950 [IU]/h via INTRAVENOUS
  Filled 2013-12-19 (×2): qty 250

## 2013-12-19 MED ORDER — CLOPIDOGREL BISULFATE 75 MG PO TABS
75.0000 mg | ORAL_TABLET | Freq: Every day | ORAL | Status: DC
  Administered 2013-12-20 – 2013-12-21 (×2): 75 mg via ORAL
  Filled 2013-12-19 (×2): qty 1

## 2013-12-19 MED ORDER — HEPARIN BOLUS VIA INFUSION
4000.0000 [IU] | INTRAVENOUS | Status: AC
  Administered 2013-12-19: 4000 [IU] via INTRAVENOUS
  Filled 2013-12-19: qty 4000

## 2013-12-19 MED ORDER — NITROGLYCERIN 0.4 MG SL SUBL
0.4000 mg | SUBLINGUAL_TABLET | SUBLINGUAL | Status: DC | PRN
  Filled 2013-12-19: qty 1

## 2013-12-19 MED ORDER — SODIUM CHLORIDE 0.9 % IV SOLN: INTRAVENOUS | Status: AC

## 2013-12-19 MED ORDER — SODIUM CHLORIDE 0.9 % IV SOLN: INTRAVENOUS | Status: DC

## 2013-12-19 MED ORDER — HEPARIN SODIUM (PORCINE) 1000 UNIT/ML IJ SOLN
INTRAMUSCULAR | Status: AC
  Filled 2013-12-19: qty 1

## 2013-12-19 MED ORDER — LIDOCAINE HCL (PF) 1 % IJ SOLN
INTRAMUSCULAR | Status: AC
  Filled 2013-12-19: qty 30

## 2013-12-19 MED ORDER — NITROGLYCERIN IN D5W 200-5 MCG/ML-% IV SOLN: 3.0000 ug/min | INTRAVENOUS | Status: DC

## 2013-12-19 MED ORDER — ADULT MULTIVITAMIN W/MINERALS CH
1.0000 | ORAL_TABLET | Freq: Every day | ORAL | Status: DC
  Administered 2013-12-19 – 2013-12-21 (×3): 1 via ORAL
  Filled 2013-12-19 (×3): qty 1

## 2013-12-19 MED ORDER — ONDANSETRON HCL 4 MG PO TABS: 8.0000 mg | ORAL_TABLET | Freq: Three times a day (TID) | ORAL | Status: DC | PRN

## 2013-12-19 MED ORDER — BENAZEPRIL HCL 10 MG PO TABS
10.0000 mg | ORAL_TABLET | Freq: Every day | ORAL | Status: DC
  Administered 2013-12-19 – 2013-12-21 (×3): 10 mg via ORAL
  Filled 2013-12-19 (×3): qty 1

## 2013-12-19 MED ORDER — SODIUM CHLORIDE 0.9 % IV SOLN
Freq: Once | INTRAVENOUS | Status: AC
  Administered 2013-12-19: 12:00:00 via INTRAVENOUS

## 2013-12-19 MED ORDER — SODIUM CHLORIDE 0.9 % IJ SOLN
3.0000 mL | Freq: Two times a day (BID) | INTRAMUSCULAR | Status: DC
  Administered 2013-12-20: 22:00:00 3 mL via INTRAVENOUS

## 2013-12-19 MED ORDER — HYDROCODONE-ACETAMINOPHEN 5-325 MG PO TABS
1.0000 | ORAL_TABLET | Freq: Four times a day (QID) | ORAL | Status: DC | PRN
  Administered 2013-12-19 – 2013-12-21 (×2): 1 via ORAL
  Filled 2013-12-19 (×2): qty 1

## 2013-12-19 MED ORDER — METOPROLOL SUCCINATE 12.5 MG HALF TABLET
12.5000 mg | ORAL_TABLET | Freq: Two times a day (BID) | ORAL | Status: DC
  Administered 2013-12-19 – 2013-12-21 (×3): 12.5 mg via ORAL
  Filled 2013-12-19 (×5): qty 1

## 2013-12-19 MED ORDER — ATORVASTATIN CALCIUM 10 MG PO TABS: 10.0000 mg | ORAL_TABLET | Freq: Every day | ORAL | Status: DC

## 2013-12-19 MED ORDER — ONE-DAILY MULTI VITAMINS PO TABS: 1.0000 | ORAL_TABLET | Freq: Every day | ORAL | Status: DC

## 2013-12-19 MED ORDER — ASPIRIN EC 81 MG PO TBEC
81.0000 mg | DELAYED_RELEASE_TABLET | Freq: Every day | ORAL | Status: DC
  Administered 2013-12-20 – 2013-12-21 (×2): 81 mg via ORAL
  Filled 2013-12-19 (×2): qty 1

## 2013-12-19 MED ORDER — NITROGLYCERIN IN D5W 200-5 MCG/ML-% IV SOLN
2.0000 ug/min | Freq: Once | INTRAVENOUS | Status: AC
  Administered 2013-12-19: 5 ug/min via INTRAVENOUS
  Filled 2013-12-19: qty 250

## 2013-12-19 MED ORDER — DEXAMETHASONE 4 MG PO TABS
8.0000 mg | ORAL_TABLET | Freq: Two times a day (BID) | ORAL | Status: DC
  Administered 2013-12-19 – 2013-12-21 (×4): 8 mg via ORAL
  Filled 2013-12-19 (×6): qty 2

## 2013-12-19 MED ORDER — ACETAMINOPHEN ER 650 MG PO TBCR: 650.0000 mg | EXTENDED_RELEASE_TABLET | Freq: Three times a day (TID) | ORAL | Status: DC | PRN

## 2013-12-19 MED ORDER — ASPIRIN 81 MG PO CHEW: 324.0000 mg | CHEWABLE_TABLET | ORAL | Status: DC

## 2013-12-19 MED ORDER — SODIUM CHLORIDE 0.9 % IV SOLN: 250.0000 mL | INTRAVENOUS | Status: DC | PRN

## 2013-12-19 MED ORDER — ASPIRIN 300 MG RE SUPP: 300.0000 mg | RECTAL | Status: DC

## 2013-12-19 MED ORDER — CLOPIDOGREL BISULFATE 300 MG PO TABS
ORAL_TABLET | ORAL | Status: AC
  Filled 2013-12-19: qty 1

## 2013-12-19 MED ORDER — MIDAZOLAM HCL 2 MG/2ML IJ SOLN
INTRAMUSCULAR | Status: AC
  Filled 2013-12-19: qty 2

## 2013-12-19 MED ORDER — SODIUM CHLORIDE 0.9 % IJ SOLN: 3.0000 mL | INTRAMUSCULAR | Status: DC | PRN

## 2013-12-19 NOTE — Telephone Encounter (Signed)
Spoke with patient. She thinks she was confusing Dr Aundra Dubin and Dr Angelena Form. She is willing to see Dr Angelena Form back in follow up since she initially saw him 06/15/12.

## 2013-12-19 NOTE — Progress Notes (Signed)
First attempt to deflate TR band, slow oozing noted, 3 cc put back in.  Rt hand noted to have large hematoma, pt states lab drew blood there about an hour ago.  Manual pressure held to decompress hematoma, ice pack applied to hand, elevated on pillow.

## 2013-12-19 NOTE — Progress Notes (Signed)
ANTICOAGULATION CONSULT NOTE - Initial Consult  Pharmacy Consult for Heparin Indication: chest pain/ACS  Allergies  Allergen Reactions  . Propoxyphene N-Acetaminophen Swelling    tongue swelling  . Penicillins Swelling    Facial swelling  . Cortisone Rash  . Pseudoephedrine Hcl Er Rash and Other (See Comments)    Face gets red    Patient Measurements: Weight: 173 lb (78.472 kg) Heparin Dosing Weight:   Vital Signs: Temp: 98.7 F (37.1 C) (03/18 1125) BP: 154/79 mmHg (03/18 1125) Pulse Rate: 87 (03/18 1125)  Labs:  Recent Labs  12/17/13 1028 12/17/13 1029  HGB  --  13.1  HCT  --  39.5  PLT  --  149  CREATININE 0.9  --     The CrCl is unknown because both a height and weight (above a minimum accepted value) are required for this calculation.   Medical History: Past Medical History  Diagnosis Date  . Allergy   . Allergic rhinitis   . Hypothyroidism   . Urinary tract infection   . Diverticulosis   . Hx of adenomatous colonic polyps 02/1999  . Tubulovillous adenoma of colon 12/2009    with HGD  . Low back pain     gets ESI from Dr. Rennis Harding  . Arthritis   . Hypertension   . HOH (hard of hearing)   . Wears hearing aid   . Breast cancer of upper-outer quadrant of left female breast 11/08/13    Medications:  Infusions:  . heparin    . heparin    Scheduled:     Assessment: 89 yoF presenting to [facility] ED 3/18 with chest pressure.  PMH is significant for breast cancer with recent port-a-cath placement.  Pharmacy is consulted to dose IV Heparin for r/o ACS/STEMI.   CBC: pending (last Hgb 13.1 and Plt 149 on 12/17/13)  SCr 0.9, CrCl ~ 65 ml/min  Baseline coags: pending  Troponins: 0.02  Goal of Therapy:  Heparin level 0.3-0.7 units/ml Monitor platelets by anticoagulation protocol: Yes   Plan:   Baseline PTT, PT/INR  Give heparin 4000 units bolus IV x 1  Start heparin IV infusion at 950 units/hr (9.5 ml/hr)  Heparin level 8 hours after  starting  Daily heparin level and CBC  Continue to monitor H&H and platelets  Gretta Arab PharmD, BCPS Pager [phone] 12/19/2013 11:53 AM

## 2013-12-19 NOTE — ED Notes (Signed)
Pt c/o chest "pressure" since Monday; unbearable today; took chemotherapy Monday--had an injection yesterday for "white cells"

## 2013-12-19 NOTE — CV Procedure (Signed)
Cardiac Catheterization Operative Report  Kim [name] [id number] 3/18/20155:29 PM Laurey Morale, MD  Procedure Performed:  1. Left Heart Catheterization 2. Selective Coronary Angiography 3. Left ventricular angiogram 4. PTCA/DES x 1 mid RCA  Operator: Lauree Chandler, MD  Arterial access site:  Right radial artery.   Indication:  79 yo female with history of breast cancer, HTN with unstable angina.                                       Procedure Details: The risks, benefits, complications, treatment options, and expected outcomes were discussed with the patient. The patient and/or family concurred with the proposed plan, giving informed consent. The patient was brought to the cath lab after IV hydration was begun and oral premedication was given. The patient was further sedated with Versed and Fentanyl. The right wrist was assessed with an Allens test which was positive. The right wrist was prepped and draped in a sterile fashion. 1% lidocaine was used for local anesthesia. Using the modified Seldinger access technique, a 5 French sheath was placed in the right radial artery. 3 mg Verapamil was given through the sheath. 4000 units IV heparin was given. Standard diagnostic catheters were used to perform selective coronary angiography. A pigtail catheter was used to perform a left ventricular angiogram. Following the diagnostic portion of the case, I elected to proceed to PCI of the mid RCA.   PCI Note: The patient was given Plavix 600 mg po x 1. 4000 additional units of heparin was given IV. ACT was 249. I engaged the RCA with a JR4 guiding catheter. I then advanced a Cougar IC wire down the RCA. A 2.5 x 12 mm balloon was used to pre-dilate the stenosis in the mid RCA. I then carefully positioned and deployed a 4.0 x 16 mm Promus Premier DES in the mid RCA. The stent was post-dilated x 1 with a 4.5 x 12 mm [place] balloon. The stenosis was taken from 99% down to 0%. The wire and  guide were removed.   The sheath was removed from the right radial artery and a Terumo hemostasis band was applied at the arteriotomy site on the right wrist.   There were no immediate complications. The patient was taken to the recovery area in stable condition.   Hemodynamic Findings: Central aortic pressure: 121/66 Left ventricular pressure: 123/16/31  Angiographic Findings:  Left main: No obstructive disease.   Left Anterior Descending Artery:  Large caliber vessel that courses to the apex. The proximal vessel has diffuse 20% stenosis. The mid vessel has diffuse 50% stenosis after the takeoff of the diagonal branch. The distal vessel has diffuse 30% stenosis. The diagonal branch is moderate in caliber with 30% proximal stenosis.   Circumflex Artery: Large caliber vessel with 20% proximal stenosis, 30% mid stenosis after the takeoff of the two obtuse marginal branches. The first OM branch is moderate in caliber, tortuous with 20% stenosis. The second OM branch is moderate in caliber with 40% proximal stenosis.   Right Coronary Artery: Large dominant vessel with 30% proximal stenosis, 99% mid stenosis, 20% distal stenosis. The posterolateral branch is moderate in caliber with 30% stenosis. The PDA is moderate in caliber with proximal 70% stenosis, mid 50% stenosis.   Left Ventricular Angiogram: LVEF=65-70%  Impression: 1. Severe stenosis mid RCA 2. Unstable angina 3. Successful PTCA/DES x 1 mid RCA 4.  Moderate disease in LAD, Circumflex, PDA 5. Normal LV systolic function  Recommendations: She will need continued therapy with ASA and Plavix for at least one year. Beta blocker as tolerated. Start statin.        Complications:  None. The patient tolerated the procedure well.

## 2013-12-19 NOTE — ED Provider Notes (Signed)
CSN: [id number]     Arrival date & time 12/19/13  1119 History   First MD Initiated Contact with Patient 12/19/13 1120     Chief Complaint  Patient presents with  . Chest Pain     (Consider location/radiation/quality/duration/timing/severity/associated sxs/prior Treatment) Patient is a 79 y.o. female presenting with chest pain. The history is provided by the patient.  Chest Pain Pain location:  Substernal area Pain quality: pressure   Pain radiates to:  Does not radiate Pain radiates to the back: no   Pain severity:  Moderate Onset quality:  Sudden Timing:  Intermittent Progression:  Improving Chronicity:  Recurrent Context: eating and at rest  Drug use: just after eating.   Relieved by:  Nothing Worsened by:  Nothing tried Ineffective treatments:  None tried Associated symptoms: no abdominal pain, no cough, no fever, no nausea, no shortness of breath and not vomiting     Past Medical History  Diagnosis Date  . Allergy   . Allergic rhinitis   . Hypothyroidism   . Urinary tract infection   . Diverticulosis   . Hx of adenomatous colonic polyps 02/1999  . Tubulovillous adenoma of colon 12/2009    with HGD  . Low back pain     gets ESI from Dr. Rennis Harding  . Arthritis   . Hypertension   . HOH (hard of hearing)   . Wears hearing aid   . Breast cancer of upper-outer quadrant of left female breast 11/08/13   Past Surgical History  Procedure Laterality Date  . Abdominal hysterectomy    . Bilateral salpingoophorectomy      see Laurin Coder NP for GYN exams  . Cataract extraction w/ intraocular lens  implant, bilateral  2012    bilateral  . Colonoscopy    . Breast lumpectomy with needle localization and axillary lymph node dissection Left 11/05/2013    Procedure: LEFT BREAST LUMPECTOMY WITH NEEDLE LOCALIZATION and axillary lymph Node Dissection;  Surgeon: Edward Jolly, MD;  Location: [facility];  Service: General;  Laterality: Left;  . Breast surgery    .  Portacath placement Right 12/11/2013    Procedure: INSERTION PORT-A-CATH;  Surgeon: Edward Jolly, MD;  Location: [facility];  Service: General;  Laterality: Right;  Subclavian Vein; REMOVAL OF JP DRAIN LEFT AXILLA   Family History  Problem Relation Age of Onset  . Alcohol abuse Father   . Kidney failure Mother   . Colon cancer      family   History  Substance Use Topics  . Smoking status: Never Smoker   . Smokeless tobacco: Never Used  . Alcohol Use: No   OB History   Grav Para Term Preterm Abortions TAB SAB Ect Mult Living                 Review of Systems  Constitutional: Negative for fever.  Respiratory: Negative for cough and shortness of breath.   Cardiovascular: Positive for chest pain.  Gastrointestinal: Negative for nausea, vomiting and abdominal pain.  All other systems reviewed and are negative.      Allergies  Propoxyphene n-acetaminophen; Penicillins; Cortisone; and Pseudoephedrine hcl er  Home Medications   Current Outpatient Rx  Name  Route  Sig  Dispense  Refill  . acetaminophen (TYLENOL) 650 MG CR tablet   Oral   Take 650 mg by mouth every 8 (eight) hours as needed.         . benazepril (LOTENSIN) 10 MG tablet  Oral   Take 1 tablet (10 mg total) by mouth daily.   30 tablet   10     Dispense as written.   . Calcium Citrate-Vitamin D (CITRACAL MAXIMUM PO)   Oral   Take by mouth.           . cetirizine (ZYRTEC) 10 MG tablet   Oral   Take 10 mg by mouth daily as needed for allergies.         Marland Kitchen dexamethasone (DECADRON) 4 MG tablet   Oral   Take 4 mg by mouth 2 (two) times daily with a meal.         . HYDROcodone-acetaminophen (NORCO) 5-325 MG per tablet   Oral   Take 1-2 tablets by mouth every 6 (six) hours as needed.   30 tablet   0   . levothyroxine (SYNTHROID, LEVOTHROID) 125 MCG tablet      TAKE 1 TABLET BY MOUTH ONCE A DAY   30 tablet   4   . lidocaine-prilocaine (EMLA) cream   Topical   Apply  topically as needed.   30 g   6   . Multiple Vitamin (MULTIVITAMIN) tablet   Oral   Take 1 tablet by mouth daily.           . ondansetron (ZOFRAN) 8 MG tablet   Oral   Take 8 mg by mouth every 8 (eight) hours as needed for nausea or vomiting.         . prochlorperazine (COMPAZINE) 10 MG tablet   Oral   Take 10 mg by mouth every 6 (six) hours as needed for nausea or vomiting.          BP 154/79  Pulse 87  Temp(Src) 98.7 F (37.1 C)  Resp 20  Wt 173 lb (78.472 kg)  SpO2 96% Physical Exam  Nursing note and vitals reviewed. Constitutional: She is oriented to person, place, and time. She appears well-developed and well-nourished. No distress.  HENT:  Head: Normocephalic and atraumatic.  Eyes: EOM are normal. Pupils are equal, round, and reactive to light.  Neck: Normal range of motion. Neck supple.  Cardiovascular: Normal rate and regular rhythm.  Exam reveals no friction rub.   No murmur heard. Pulmonary/Chest: Effort normal and breath sounds normal. No respiratory distress. She has no wheezes. She has no rales.  R chest - port  Abdominal: Soft. She exhibits no distension. There is no tenderness. There is no rebound.  Musculoskeletal: Normal range of motion. She exhibits no edema.  Neurological: She is alert and oriented to person, place, and time. No cranial nerve deficit. She exhibits normal muscle tone. Coordination normal.  Skin: No rash noted. She is not diaphoretic.    ED Course  Procedures (including critical care time) Labs Review Labs Reviewed  CBC WITH DIFFERENTIAL  COMPREHENSIVE METABOLIC PANEL  I-STAT [place], ED   Imaging Review Dg Chest Port 1 View  12/19/2013   CLINICAL DATA:  Ischemic chest pain, EKG changes, history hypertension, breast cancer  EXAM: PORTABLE CHEST - 1 VIEW  COMPARISON:  Portable exam 1313 hr compared to 12/11/2013  FINDINGS: Enlargement of cardiac silhouette.  Right subclavian Port-A-Cath tip projecting over SVC.  Slight  pulmonary vascular congestion.  Mediastinal contours normal.  Chronic bronchitic changes and bibasilar atelectasis.  No definite acute infiltrate, pleural effusion or pneumothorax.  Bones demineralized.  Surgical clips left breast and left axilla.  IMPRESSION: Enlargement of cardiac silhouette with pulmonary vascular congestion.  Bronchitic changes with bibasilar  atelectasis.   Electronically Signed   By: Lavonia Dana M.D.   On: 12/19/2013 13:37     EKG Interpretation None      Date: 12/19/2013  Rate: 80  Rhythm: normal sinus rhythm  QRS Axis: normal  Intervals: normal  ST/T Wave abnormalities: ST elevation in aVR, V1, ST depression laterally (V5-V6) - new  Conduction Disutrbances:none  Narrative Interpretation:   Old EKG Reviewed: changes noted   CRITICAL CARE Performed by: Osvaldo Shipper   Total critical care time: 30 minutes  Critical care time was exclusive of separately billable procedures and treating other patients.  Critical care was necessary to treat or prevent imminent or life-threatening deterioration.  Critical care was time spent personally by me on the following activities: development of treatment plan with patient and/or surrogate as well as nursing, discussions with consultants, evaluation of patient's response to treatment, examination of patient, obtaining history from patient or surrogate, ordering and performing treatments and interventions, ordering and review of laboratory studies, ordering and review of radiographic studies, pulse oximetry and re-evaluation of patient's condition.  MDM   Final diagnoses:  ACS (acute coronary syndrome)    66F with hx of breast cancer, recent port-a-cath placement for chemo presents with chest pressure. Began this am, 30-45 minute episode after eating, no radiation. Patient has had multiple episodes of this chest pain before, but this was the worst.  Patient here with 2/10 chest pressure, nonradiating. EKG with  ischemic changes. I spoke with Dr. Martinique who agreed, no Code STEMI call, but will start with NTG IV and heparin IV. Patient admitted to [facility] for her ACS.    Osvaldo Shipper, MD 12/19/13 [phone]

## 2013-12-19 NOTE — Telephone Encounter (Signed)
Received a written note from Medical Records to call patient. I called patient and reviewed her chemotherapy from Monday, 3/16 and her Neulasta injection on Tuesday, 3/17. Patient stated, "I've had chest pressure since last night. When I lay down the pressure is worse. When I stand up the pressure is better. I ate breakfast this morning and then went back to bed." Patient denied chest pain, shortness of breath, chills, fever, nausea, vomiting and diarrhea. Per Dr. Humphrey Rolls, have patient go to the Emergency Room. I instructed the patient to go to the closest ER to be seen. Patient stated,"My heart doctor said he could see me today. His name is Dr. Lequita Halt." I instructed patient not to wait to be seen. This chest pressure needs to be addressed, sooner the better. Patient stated, "the closest ER would be [facility]." Again, I reinforced that she needs to be assessed. Patient would have her husband bring her to the ER. Patient verbalized understanding.

## 2013-12-19 NOTE — Telephone Encounter (Signed)
Pt states she will call back to schedule an appt with Dr Angelena Form instead of Dr Aundra Dubin. She is satisfied with this.

## 2013-12-19 NOTE — H&P (Signed)
Physician History and Physical    Patient ID: Kim [name] MRN: [id number] DOB/AGE: 77/15/1936 77 y.o. Admit date: 12/19/2013  Primary Care Physician: Alysia Penna MD Primary Cardiologist Darlina Guys MD  HPI: Kim [name] is a pleasant 79 yo WF seen in the [facility] ED for evaluation of chest pain. She has a history of cardiac murmur, HTN, and breast CA. 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 PET/CT negative for other metastatic disease. She began chemotherapy with [place] (docetaxel/carboplatin/trastuzumab) on 12/17/13 with Neulasta and dexamethasone. She reports that even before her chemotherapy she has been experiencing symptoms of mid substernal chest pressure. This sometimes occurs at night. She takes Tylenol and it eventually wears off. This am after eating breakfast she had recurrent pain described as severe anterior chest pressure. It does not radiate. No N/V, diaphoresis, or SOB. No history of CAD. Pain now 1-2/10.  Recent Echo 12/07/13 showed hyperdynamic LV function EF 60-65%. Mild to moderate MR. Prior Echos demonstrated SAM and basal septal hypertrophy. Ecg today demonstrates new ischemic changes in the lateral leads.  Review of systems complete and found to be negative unless listed above  Past Medical History  Diagnosis Date  . Allergy   . Allergic rhinitis   . Hypothyroidism   . Urinary tract infection   . Diverticulosis   . Hx of adenomatous colonic polyps 02/1999  . Tubulovillous adenoma of colon 12/2009    with HGD  . Low back pain     gets ESI from Dr. Rennis Harding  . Arthritis   . Hypertension   . HOH (hard of hearing)   . Wears hearing aid   . Breast cancer of upper-outer quadrant of left female breast 11/08/13    Family History  Problem Relation Age of Onset  . Alcohol abuse Father   . Kidney failure Mother   . Colon cancer      family    History     Social History  . Marital Status: Married    Spouse Name: N/A    Number of Children: 2  . Years of Education: N/A   Occupational History  . Retired-school system    Social History Main Topics  . Smoking status: Never Smoker   . Smokeless tobacco: Never Used  . Alcohol Use: No  . Drug Use: No  . Sexual Activity: Not Currently   Other Topics Concern  . Not on file   Social History Narrative  . No narrative on file    Past Surgical History  Procedure Laterality Date  . Abdominal hysterectomy    . Bilateral salpingoophorectomy      see Laurin Coder NP for GYN exams  . Cataract extraction w/ intraocular lens  implant, bilateral  2012    bilateral  . Colonoscopy    . Breast lumpectomy with needle localization and axillary lymph node dissection Left 11/05/2013    Procedure: LEFT BREAST LUMPECTOMY WITH NEEDLE LOCALIZATION and axillary lymph Node Dissection;  Surgeon: Edward Jolly, MD;  Location: [facility];  Service: General;  Laterality: Left;  . Breast surgery    . Portacath placement Right 12/11/2013    Procedure: INSERTION PORT-A-CATH;  Surgeon: Edward Jolly, MD;  Location: [place];  Service: General;  Laterality: Right;  Subclavian Vein; REMOVAL OF JP DRAIN LEFT AXILLA      (Not in a hospital admission)  Physical  Exam: Blood pressure 138/68, pulse 75, temperature 98.7 F (37.1 C), resp. rate 22, height 5\' 8"  (1.727 m), weight 173 lb (78.472 kg), SpO2 97.00%.  She is a pleasant WF in NAD. HEENT: [place]/AT, PERRLA, EOMI, oropharynx is clear.  Neck: supple. No JVD, adenopathy, bruits, or thyromegaly. Lungs: clear CV: RRR, Normal S1-2, Harsh 3/6 systolic murmur RUSB>>LSB Chest: porta cath in right upper chest with some bruising. Abdomen: soft, NT, BS positive, no HSM Ext: pulses 2+, no edema.  Skin: warm and dry. Neuro: alert and oriented x 3. CN II-XII intact.  Labs:   Lab Results  Component Value Date   WBC 31.4* 12/19/2013    HGB 12.1 12/19/2013   HCT 35.6* 12/19/2013   MCV 88.3 12/19/2013   PLT 127* 12/19/2013    Recent Labs Lab 12/19/13 1140  NA 134*  K 4.1  CL 99  CO2 21  BUN 30*  CREATININE 1.00  CALCIUM 8.6  PROT 6.1  BILITOT 1.0  ALKPHOS 59  ALT 36*  AST 17  GLUCOSE 111*   No results found for this basename: CKTOTAL, CKMB, CKMBINDEX, TROPONINI    Lab Results  Component Value Date   CHOL 218* 05/18/2012   CHOL 216* 04/22/2009   Lab Results  Component Value Date   HDL 45.10 05/18/2012   HDL 39.40 04/22/2009   No results found for this basename: [facility]   Lab Results  Component Value Date   TRIG 147.0 05/18/2012   TRIG 110.0 04/22/2009   Lab Results  Component Value Date   CHOLHDL 5 05/18/2012   CHOLHDL 5 04/22/2009   Lab Results  Component Value Date   LDLDIRECT 147.6 05/18/2012   LDLDIRECT 150.1 04/22/2009    POC troponin 0.02   Radiology: pending  EKG: NSR septal infarct age undetermined. ST-T depression in leads 1,avl, V5-6 c/w lateral ischemia. This is new since January 2015.  ASSESSMENT AND PLAN:  1. Acute chest pain. Symptoms are very worrisome for unstable angina with new lateral ischemic changes on Ecg. Will initiate therapy with IV Ntg and heparin. ASA, beta blocker and statin started. Given need for definitive evaluation I have recommended cardiac cath +/- PCI today. Will cycle cardiac enzymes. The procedure and risks were reviewed including but not limited to death, myocardial infarction, stroke, arrythmias, bleeding, transfusion, emergency surgery, dye allergy, or renal dysfunction. The patient voices understanding and is agreeable to proceed.  2. Breast CA- currently receiving chemotherapy  3. Elevated WBC- probably due to steroid therapy. No signs of active infection.  4. HTN  5. Murmur- Hypertrophic cardiomyopathy.     SignedCollier Salina [facility] 12/19/2013, 12:32 PM

## 2013-12-19 NOTE — Telephone Encounter (Signed)
See phone note 12/19/13

## 2013-12-20 ENCOUNTER — Encounter (HOSPITAL_COMMUNITY): Payer: Self-pay | Admitting: Nurse Practitioner

## 2013-12-20 ENCOUNTER — Other Ambulatory Visit: Payer: Self-pay

## 2013-12-20 DIAGNOSIS — E785 Hyperlipidemia, unspecified: Secondary | ICD-10-CM | POA: Diagnosis present

## 2013-12-20 DIAGNOSIS — I2510 Atherosclerotic heart disease of native coronary artery without angina pectoris: Principal | ICD-10-CM

## 2013-12-20 DIAGNOSIS — I200 Unstable angina: Secondary | ICD-10-CM

## 2013-12-20 LAB — CBC
HCT: 34.1 % — ABNORMAL LOW (ref 36.0–46.0)
Hemoglobin: 11.5 g/dL — ABNORMAL LOW (ref 12.0–15.0)
MCH: 29.9 pg (ref 26.0–34.0)
MCHC: 33.7 g/dL (ref 30.0–36.0)
MCV: 88.8 fL (ref 78.0–100.0)
Platelets: 117 10*3/uL — ABNORMAL LOW (ref 150–400)
RBC: 3.84 MIL/uL — ABNORMAL LOW (ref 3.87–5.11)
RDW: 13.8 % (ref 11.5–15.5)
WBC: 24.5 10*3/uL — ABNORMAL HIGH (ref 4.0–10.5)

## 2013-12-20 LAB — TROPONIN I
Troponin I: <0.3 ng/mL (ref <0.30)
Troponin I: <0.3 ng/mL (ref <0.30)
Troponin I: <0.3 ng/mL (ref <0.30)

## 2013-12-20 LAB — BASIC METABOLIC PANEL
BUN: 31 mg/dL — ABNORMAL HIGH (ref 6–23)
CO2: 23 mEq/L (ref 19–32)
Calcium: 8.6 mg/dL (ref 8.4–10.5)
Chloride: 100 mEq/L (ref 96–112)
Creatinine, Ser: 0.84 mg/dL (ref 0.50–1.10)
GFR calc Af Amer: 76 mL/min — ABNORMAL LOW (ref >90)
GFR calc non Af Amer: 65 mL/min — ABNORMAL LOW (ref >90)
Glucose, Bld: 124 mg/dL — ABNORMAL HIGH (ref 70–99)
Potassium: 4.5 mEq/L (ref 3.7–5.3)
Sodium: 136 mEq/L — ABNORMAL LOW (ref 137–147)

## 2013-12-20 LAB — LIPID PANEL
Cholesterol: 164 mg/dL (ref 0–200)
HDL: 46 mg/dL (ref >39)
LDL Cholesterol: 91 mg/dL (ref 0–99)
Total CHOL/HDL Ratio: 3.6 RATIO
Triglycerides: 137 mg/dL (ref <150)
VLDL: 27 mg/dL (ref 0–40)

## 2013-12-20 MED ORDER — MORPHINE SULFATE 2 MG/ML IJ SOLN
INTRAMUSCULAR | Status: AC
  Administered 2013-12-20: 2 mg via INTRAVENOUS
  Filled 2013-12-20: qty 1

## 2013-12-20 MED ORDER — NITROGLYCERIN 0.4 MG SL SUBL: 0.4000 mg | SUBLINGUAL_TABLET | SUBLINGUAL | Status: DC | PRN

## 2013-12-20 MED ORDER — PANTOPRAZOLE SODIUM 40 MG PO TBEC
40.0000 mg | DELAYED_RELEASE_TABLET | Freq: Every day | ORAL | Status: DC
  Administered 2013-12-20 – 2013-12-21 (×2): 40 mg via ORAL
  Filled 2013-12-20 (×2): qty 1

## 2013-12-20 MED ORDER — CLOPIDOGREL BISULFATE 75 MG PO TABS: 75.0000 mg | ORAL_TABLET | Freq: Every day | ORAL | Status: DC

## 2013-12-20 MED ORDER — METOPROLOL SUCCINATE ER 25 MG PO TB24: 25.0000 mg | ORAL_TABLET | Freq: Every day | ORAL | Status: DC

## 2013-12-20 MED ORDER — ATORVASTATIN CALCIUM 40 MG PO TABS: 40.0000 mg | ORAL_TABLET | Freq: Every day | ORAL | Status: DC

## 2013-12-20 MED ORDER — ASPIRIN 81 MG PO TBEC: 81.0000 mg | DELAYED_RELEASE_TABLET | Freq: Every day | ORAL | Status: DC

## 2013-12-20 MED ORDER — MORPHINE SULFATE 2 MG/ML IJ SOLN: 2.0000 mg | INTRAMUSCULAR | Status: DC | PRN

## 2013-12-20 NOTE — Discharge Summary (Signed)
Discharge Summary   Kim [name] ID: Kim [name],  MRN: [id number], DOB/AGE: [birth year] 79 y.o.  Admit date: 12/19/2013 Discharge date: 12/21/2013  Primary Care Provider: Alysia Penna A Primary Cardiologist: C. Angelena Form, MD   Discharge Diagnoses Principal Problem:   Unstable angina  **s/p PCI and drug-eluting stent placement to the RCA this admission.  Active Problems:   Hypertension   CAD (coronary artery disease)   HYPOTHYROIDISM   Breast cancer of upper-outer quadrant of left female breast   Hyperlipidemia  Allergies Allergies  Allergen Reactions  . Propoxyphene N-Acetaminophen Swelling    tongue swelling  . Penicillins Swelling    Facial swelling  . Cortisone Rash  . Pseudoephedrine Hcl Er Rash and Other (See Comments)    Face gets red   Procedures  Cardiac Catheterization and Percutaneous Coronary Intervention 3.18.2015  Hemodynamic Findings: Central aortic pressure: 121/66 Left ventricular pressure: 123/16/31  Angiographic Findings:  Left main: No obstructive disease.   Left Anterior Descending Artery:  Large caliber vessel that courses to the apex. The proximal vessel has diffuse 20% stenosis. The mid vessel has diffuse 50% stenosis after the takeoff of the diagonal branch. The distal vessel has diffuse 30% stenosis. The diagonal branch is moderate in caliber with 30% proximal stenosis.   Circumflex Artery: Large caliber vessel with 20% proximal stenosis, 30% mid stenosis after the takeoff of the two obtuse marginal branches. The first OM branch is moderate in caliber, tortuous with 20% stenosis. The second OM branch is moderate in caliber with 40% proximal stenosis.   Right Coronary Artery: Large dominant vessel with 30% proximal stenosis, 99% mid stenosis, 20% distal stenosis. The posterolateral branch is moderate in caliber with 30% stenosis. The PDA is moderate in caliber with proximal 70% stenosis, mid 50% stenosis.    **The RCA was successfully stented using  a 4.0 x 52mm Promus Premier DES**  Left Ventricular Angiogram: LVEF=65-70% _____________   History of Present Illness  79 year old female with a prior history of hypertrophic cardiomyopathy, hypertension, and breast cancer currently undergoing chemotherapy who presented to the [facility] long emergency department on March 18 with a several month history of substernal chest pressure. There, troponin was normal however ECG showed anterolateral ST depression with T-wave inversion. Decision was made to transfer the Kim [name] to [facility] cone for further evaluation and cardiology admission.  Hospital Course  Kim [name] ruled out for myocardial infarction. Given her history of symptoms and worrisome ECG changes, decision was made to pursue diagnostic catheterization. This was performed on March 18, and revealed severe mid right coronary artery stenosis and otherwise nonobstructive disease and normal LV function. The right coronary artery was then successfully stented using a 4.0 by 72mm Promus Premier drug-eluting stent. Kim [name] tolerated procedure well and was preparing for discharge on March 19 which complained of recurrent midsternal chest discomfort after eating breakfast. ECG during discomfort did show some worsening of anterolateral ST and T changes and therefore discharge was canceled and cardiac markers were recycled. Troponin remained negative and Kim [name] had no further chest discomfort. PPI therapy was added. This morning, Kim [name] has been ambulating without recurrent symptoms or limitations. She will be discharged home today in good condition.  Discharge Vitals Blood pressure 120/65, pulse 90, temperature 98.5 F (36.9 C), temperature source Oral, resp. rate 19, height 5\' 8"  (1.727 m), weight 176 lb 5.9 oz (80 kg), SpO2 99.00%.  Filed Weights   12/19/13 1225 12/19/13 1607 12/20/13 0001  Weight: 173 lb (78.472 kg) 175 lb 11.2 oz (79.697 kg) 176  lb 5.9 oz (80 kg)    Labs  CBC  Recent Labs   12/19/13 1140 12/20/13 0120  WBC 31.4* 24.5*  NEUTROABS 30.5*  --   HGB 12.1 11.5*  HCT 35.6* 34.1*  MCV 88.3 88.8  PLT 127* 623*   Basic Metabolic Panel  Recent Labs  12/19/13 1140 12/20/13 0154  NA 134* 136*  K 4.1 4.5  CL 99 100  CO2 21 23  GLUCOSE 111* 124*  BUN 30* 31*  CREATININE 1.00 0.84  CALCIUM 8.6 8.6   Liver Function Tests  Recent Labs  12/19/13 1140  AST 17  ALT 36*  ALKPHOS 59  BILITOT 1.0  PROT 6.1  ALBUMIN 3.5   Cardiac Enzymes  Recent Labs  12/20/13 1912 12/20/13 2330 12/21/13 0600  TROPONINI <0.30 <0.30 <0.30   Fasting Lipid Panel  Recent Labs  12/20/13 0154  CHOL 164  HDL 46  LDLCALC 91  TRIG 137  CHOLHDL 3.6   Disposition  Pt is being discharged home today in good condition.  Follow-up Plans & Appointments      Follow-up Information   Follow up with Richardson Dopp, PA-C On 01/08/2014. (11:30 AM - Dr. Camillia Herter PA.)    Specialty:  Physician Assistant   Contact information:   [address] [place] [address] [phone]       Follow up with Laurey Morale, MD. (as scheduled.)    Specialty:  Family Medicine   Contact information:   [place] [address] [phone]      Discharge Medications    Medication List         acetaminophen 650 MG CR tablet  Commonly known as:  TYLENOL  Take 650 mg by mouth every 8 (eight) hours as needed.     aspirin 81 MG EC tablet  Take 1 tablet (81 mg total) by mouth daily.     atorvastatin 40 MG tablet  Commonly known as:  LIPITOR  Take 1 tablet (40 mg total) by mouth daily at 6 PM.     benazepril 10 MG tablet  Commonly known as:  LOTENSIN  Take 1 tablet (10 mg total) by mouth daily.     cetirizine 10 MG tablet  Commonly known as:  ZYRTEC  Take 10 mg by mouth daily as needed for allergies.     CITRACAL MAXIMUM PO  Take 1 tablet by mouth daily.     clopidogrel 75 MG tablet  Commonly known as:  PLAVIX  Take 1 tablet (75  mg total) by mouth daily with breakfast.     dexamethasone 4 MG tablet  Commonly known as:  DECADRON  Take 8 mg by mouth 2 (two) times daily with a meal.     HYDROcodone-acetaminophen 5-325 MG per tablet  Commonly known as:  NORCO  Take 1-2 tablets by mouth every 6 (six) hours as needed.     levothyroxine 125 MCG tablet  Commonly known as:  SYNTHROID, LEVOTHROID  Take 125 mcg by mouth daily before breakfast.     lidocaine-prilocaine cream  Commonly known as:  EMLA  Apply topically as needed.     metoprolol succinate 25 MG 24 hr tablet  Commonly known as:  TOPROL XL  Take 1 tablet (25 mg total) by mouth daily.     multivitamin tablet  Take 1 tablet by mouth daily.     nitroGLYCERIN 0.4 MG SL tablet  Commonly known as:  NITROSTAT  Place 1 tablet (0.4 mg  total) under the tongue every 5 (five) minutes x 3 doses as needed for chest pain.     ondansetron 8 MG tablet  Commonly known as:  ZOFRAN  Take 8 mg by mouth every 8 (eight) hours as needed for nausea or vomiting.     pantoprazole 40 MG tablet  Commonly known as:  PROTONIX  Take 1 tablet (40 mg total) by mouth daily at 6 (six) AM.     pegfilgrastim 6 MG/0.6ML injection  Commonly known as:  NEULASTA  Inject 6 mg into the skin once.     prochlorperazine 10 MG tablet  Commonly known as:  COMPAZINE  Take 10 mg by mouth every 6 (six) hours as needed for nausea or vomiting.       Outstanding Labs/Studies  Follow-up lipids/lft's in 6-8 wks.  Duration of Discharge Encounter   40 minutes including physician time, going over the plan and medications.  Signed, Murray Hodgkins NP 12/21/2013, 8:59 AM   I have examined the Kim [name] and reviewed assessment and plan and discussed with Kim [name].  Agree with above as stated.  CP resolved. Ruled out for MI with enzymes.   VARANASI,JAYADEEP S.

## 2013-12-20 NOTE — Progress Notes (Signed)
Agree. cdm 

## 2013-12-20 NOTE — Progress Notes (Signed)
    S:  Pt c/o c/p after eating breakfast this AM. She said it felt similar to presenting Ss.  She was treated with sl ntg and then 2mg  of IV morphine with relief.  Duration was ~ 15 mins.  ECG during c/p showed more pronounced ST depression in V5 & V6 then prior ECG's.  In fact, the ECG @ 0328 this morning did not have any ST depression in V5-6.  After resolution of c/p, f/u ECG showed improvement of ST segments, similar as to that seen on admission and post-pci.  She remains pain free at this point.  O:   Filed Vitals:   12/20/13 1202  BP: 105/47  Pulse: 73  Temp: 97.6 F (36.4 C)  Resp: 17   Pleasant, NAD, AAOx3.  Lungs cta, Cor rrr.  ECG's: see above  A/P:  1. [place]/CAD:  Pt had recurrent chest discomfort this morning with dynamic ECG changes that improved with resolution of Ss.  ECG's and case reviewed with Dr. Angelena Form who agrees that we should monitor her today and cancel discharge.  I will cycle troponins.  Continue current meds.  As symptoms have been temporally related to meals, I will also add a PPI.  Murray Hodgkins, NP 12/20/2013 12:31 PM

## 2013-12-20 NOTE — Progress Notes (Signed)
     SUBJECTIVE: No chest pain. No SOB.   BP 117/66  Pulse 81  Temp(Src) 97.5 F (36.4 C) (Oral)  Resp 20  Ht 5\' 8"  (1.727 m)  Wt 176 lb 5.9 oz (80 kg)  BMI 26.82 kg/m2  SpO2 99%  Intake/Output Summary (Last 24 hours) at 12/20/13 0645 Last data filed at 12/19/13 2200  Gross per 24 hour  Intake    600 ml  Output      0 ml  Net    600 ml    PHYSICAL EXAM General: Well developed, well nourished, in no acute distress. Alert and oriented x 3.  Psych:  Good affect, responds appropriately Neck: No JVD. No masses noted.  Lungs: Clear bilaterally with no wheezes or rhonci noted.  Heart: RRR with systolic murmur noted. Abdomen: Bowel sounds are present. Soft, non-tender.  Extremities: No lower extremity edema.   LABS: Basic Metabolic Panel:  Recent Labs  12/19/13 1140 12/20/13 0154  NA 134* 136*  K 4.1 4.5  CL 99 100  CO2 21 23  GLUCOSE 111* 124*  BUN 30* 31*  CREATININE 1.00 0.84  CALCIUM 8.6 8.6   CBC:  Recent Labs  12/17/13 1029 12/19/13 1140 12/20/13 0120  WBC 9.9 31.4* 24.5*  NEUTROABS 8.8* 30.5*  --   HGB 13.1 12.1 11.5*  HCT 39.5 35.6* 34.1*  MCV 89.2 88.3 88.8  PLT 149 127* 117*   Cardiac Enzymes:  Recent Labs  12/19/13 1903 12/20/13 0154  TROPONINI <0.30 <0.30   Fasting Lipid Panel:  Recent Labs  12/20/13 0154  CHOL 164  HDL 46  LDLCALC 91  TRIG 137  CHOLHDL 3.6    Current Meds: . aspirin EC  81 mg Oral Daily  . atorvastatin  20 mg Oral q1800  . benazepril  10 mg Oral Daily  . clopidogrel  75 mg Oral Q breakfast  . dexamethasone  8 mg Oral BID WC  . levothyroxine  125 mcg Oral QAC breakfast  . metoprolol succinate  12.5 mg Oral BID  . multivitamin with minerals  1 tablet Oral Daily  . sodium chloride  3 mL Intravenous Q12H     ASSESSMENT AND PLAN:  1. Unstable angina/CAD: Pt admitted after cath/PCI yesterday. Severe stenosis mid RCA treated with DES x 1 mid RCA. Moderate disease in distal RCA and LAD will be treated  medically. Continue ASA, Plavix, statin, beta blocker. Discharge home today after she ambulates with cardiac rehab. She will follow with me. She can see me or one of the office NP/PAs in 2-3 weeks.   2. HOCM: continue current therapy  3. Breast cancer: Continue chemotherapy  MCALHANY,CHRISTOPHER  3/19/20156:45 AM

## 2013-12-20 NOTE — Progress Notes (Signed)
0947 Patient complained of chest pressure 7/10 mid sternal, started after dressing, no SOB or other noted s/s. Stated she had no chest pain or SOB with ambulation, washing or eating this morning and felt good. Reported to Ignacia Bayley NP. Patient had mid sternal chest pressure 5/10 when 12 lead ECG done at  949 given to and reviewed by Ignacia Bayley.  Patient given Ntg sl at 950, BP 119/65 HR 81. Patient reported no relief at 953, BP 99/51 HR 79. Reported to Ignacia Bayley. Patient continued to c/o chest pressure 5/10, Inserted IV and given morphine 2 mg IV via NS infusion at 1007 with patient stating no chest pressure at 1010. 12 lead ECG done at 1012/1013 given to Ignacia Bayley NP. Ordered to keep patient until information reviewed with cardiologist. Patient aware and resting comfortable at 1015.  Patient has ambulated in halls today and has no further chest discomfort, pressure or shortness of breath today. Serial troponins drawn. Resting comfortably at this time.

## 2013-12-20 NOTE — Progress Notes (Signed)
CARDIAC REHAB PHASE I   PRE:  Rate/Rhythm: 72 SR    BP: sitting 122/61    SaO2:   MODE:  Ambulation: 500 ft   POST:  Rate/Rhythm: 101 ST    BP: sitting 138/76     SaO2:   Tolerated fairly well. Some SOB/fatigue noted but pt sts she is doing much better. Ed completed which was difficult due to pt being [facility] Gave handouts. Pt will think about CRPII for the future and call when she is ready (too much right now with ca tx).  [id number]   Darrick Meigs CES, ACSM 12/20/2013 8:30 AM

## 2013-12-20 NOTE — Progress Notes (Signed)
TR BAND REMOVAL  LOCATION:    right radial  DEFLATED PER PROTOCOL:    yes  TIME BAND OFF / DRESSING APPLIED:   2330  SITE UPON ARRIVAL:    Level 0  SITE AFTER BAND REMOVAL:    Level 1, bruised, soft, flat, no hematoma  REVERSE ALLEN'S TEST:     positive  CIRCULATION SENSATION AND MOVEMENT:    Within Normal Limits   yes  COMMENTS:   TR band deflation delayed twice due to slow oozing.  Post radial cath instruction sheet given and reviewed w/ pt. Questions answered.  Call light in reach.  Pt forgetful about using rt hand but attempting to be compliant with restrictions.  Vicodin given for headache with complete relief.

## 2013-12-20 NOTE — Discharge Instructions (Signed)

## 2013-12-21 ENCOUNTER — Other Ambulatory Visit: Payer: Self-pay

## 2013-12-21 DIAGNOSIS — I2510 Atherosclerotic heart disease of native coronary artery without angina pectoris: Secondary | ICD-10-CM

## 2013-12-21 LAB — TROPONIN I
Troponin I: <0.3 ng/mL (ref <0.30)
Troponin I: <0.3 ng/mL (ref <0.30)

## 2013-12-21 MED ORDER — PANTOPRAZOLE SODIUM 40 MG PO TBEC: 40.0000 mg | DELAYED_RELEASE_TABLET | Freq: Every day | ORAL | Status: DC

## 2013-12-21 NOTE — Progress Notes (Signed)
Patient Name: Kim [name] Date of Encounter: 12/21/2013  Principal Problem:   Unstable angina Active Problems:   Hypertension   CAD (coronary artery disease)   HYPOTHYROIDISM   Breast cancer of upper-outer quadrant of left female breast   Hyperlipidemia   SUBJECTIVE  No further chest pain or sob. She thinks it might be reflux.  PPI added yesterday.  CURRENT MEDS . aspirin EC  81 mg Oral Daily  . atorvastatin  20 mg Oral q1800  . benazepril  10 mg Oral Daily  . clopidogrel  75 mg Oral Q breakfast  . dexamethasone  8 mg Oral BID WC  . levothyroxine  125 mcg Oral QAC breakfast  . metoprolol succinate  12.5 mg Oral BID  . multivitamin with minerals  1 tablet Oral Daily  . pantoprazole  40 mg Oral Q0600  . sodium chloride  3 mL Intravenous Q12H   OBJECTIVE  Filed Vitals:   12/20/13 1958 12/20/13 2355 12/21/13 0416 12/21/13 0810  BP: 113/63 95/50 101/65 120/65  Pulse: 84 76 76 90  Temp: 97.9 F (36.6 C) 98.1 F (36.7 C) 98.3 F (36.8 C) 98.5 F (36.9 C)  TempSrc: Oral Oral Oral Oral  Resp: 18 17 19 19   Height:      Weight:      SpO2: 99% 98% 98% 99%    Intake/Output Summary (Last 24 hours) at 12/21/13 0838 Last data filed at 12/21/13 4098  Gross per 24 hour  Intake    620 ml  Output      1 ml  Net    619 ml   Filed Weights   12/19/13 1225 12/19/13 1607 12/20/13 0001  Weight: 173 lb (78.472 kg) 175 lb 11.2 oz (79.697 kg) 176 lb 5.9 oz (80 kg)    PHYSICAL EXAM  General: Pleasant, NAD. Neuro: Alert and oriented X 3. Moves all extremities spontaneously. Psych: Normal affect. HEENT:  Normal  Neck: Supple without bruits or JVD. Lungs:  Resp regular and unlabored, CTA. Heart: RRR no s3, s4, 3/6 harsh, midsystolic murmur heard throughout - loudest @ apex. Abdomen: Soft, non-tender, non-distended, BS + x 4.  Extremities: No clubbing, cyanosis or edema. DP/PT/Radials 2+ and equal bilaterally. R wrist ecchymotic w/o bleeding/bruit/hematoma.  Accessory  Clinical Findings  CBC  Recent Labs  12/19/13 1140 12/20/13 0120  WBC 31.4* 24.5*  NEUTROABS 30.5*  --   HGB 12.1 11.5*  HCT 35.6* 34.1*  MCV 88.3 88.8  PLT 127* 119*   Basic Metabolic Panel  Recent Labs  12/19/13 1140 12/20/13 0154  NA 134* 136*  K 4.1 4.5  CL 99 100  CO2 21 23  GLUCOSE 111* 124*  BUN 30* 31*  CREATININE 1.00 0.84  CALCIUM 8.6 8.6   Liver Function Tests  Recent Labs  12/19/13 1140  AST 17  ALT 36*  ALKPHOS 59  BILITOT 1.0  PROT 6.1  ALBUMIN 3.5   Cardiac Enzymes  Recent Labs  12/20/13 1912 12/20/13 2330 12/21/13 0600  TROPONINI <0.30 <0.30 <0.30   Fasting Lipid Panel  Recent Labs  12/20/13 0154  CHOL 164  HDL 46  LDLCALC 91  TRIG 137  CHOLHDL 3.6   TELE  rsr  ECG  Rsr, 75, poor r prog, antlat st dep/twi - similar to yesterday.  Radiology/Studies  Dg Chest Port 1 View  12/19/2013   CLINICAL DATA:  Ischemic chest pain, EKG changes, history hypertension, breast cancer  EXAM: PORTABLE CHEST - 1 VIEW  COMPARISON:  Portable  exam 1313 hr compared to 12/11/2013  FINDINGS: Enlargement of cardiac silhouette.  Right subclavian Port-A-Cath tip projecting over SVC.  Slight pulmonary vascular congestion.  Mediastinal contours normal.  Chronic bronchitic changes and bibasilar atelectasis.  No definite acute infiltrate, pleural effusion or pneumothorax.  Bones demineralized.  Surgical clips left breast and left axilla.  IMPRESSION: Enlargement of cardiac silhouette with pulmonary vascular congestion.  Bronchitic changes with bibasilar atelectasis.   Electronically Signed   By: Lavonia Dana M.D.   On: 12/19/2013 13:37   ASSESSMENT AND PLAN  1. [place]/CAD:  S/p PCI/DES to the RCA.  She had c/p after eating bfast yesterday with dynamic ecg changes but troponin's have been neg and Ss have not recurred.  ? GERD.  PPI started yesterday and will cont at discharged.  Cont asa, plavix, statin, bb.  2.  HOCM:  Cont bb.  Euvolemic.  3.  Breast CA:   Undergoing chemo.  4.  HL:  Cont statin.  5.  HTN:  Stable.  6. Dispo:  Ambulate.  D/c today.  Signed, Murray Hodgkins NP  I have examined the patient and reviewed assessment and plan and discussed with patient.  Agree with above as stated.  Negative enzymes.  Bruised right wrist.  OK for discharge. F/u with Dr. Cheree Ditto.

## 2013-12-21 NOTE — Progress Notes (Signed)
Pt is walking independently without CP. No questions.  Yves Dill CES, ACSM 9:28 AM 12/21/2013

## 2013-12-24 ENCOUNTER — Encounter: Payer: Self-pay | Admitting: Gastroenterology

## 2013-12-24 ENCOUNTER — Telehealth: Payer: Self-pay | Admitting: Adult Health

## 2013-12-24 ENCOUNTER — Other Ambulatory Visit (HOSPITAL_BASED_OUTPATIENT_CLINIC_OR_DEPARTMENT_OTHER): Payer: Medicare Other

## 2013-12-24 ENCOUNTER — Ambulatory Visit: Payer: Medicare Other

## 2013-12-24 ENCOUNTER — Encounter: Payer: Self-pay | Admitting: Adult Health

## 2013-12-24 ENCOUNTER — Ambulatory Visit (HOSPITAL_BASED_OUTPATIENT_CLINIC_OR_DEPARTMENT_OTHER): Payer: Medicare Other | Admitting: Adult Health

## 2013-12-24 VITALS — BP 115/73 | HR 71 | Temp 98.1°F | Resp 18 | Ht 68.0 in | Wt 174.9 lb

## 2013-12-24 DIAGNOSIS — C50412 Malignant neoplasm of upper-outer quadrant of left female breast: Secondary | ICD-10-CM

## 2013-12-24 DIAGNOSIS — K921 Melena: Secondary | ICD-10-CM

## 2013-12-24 DIAGNOSIS — Z170 Estrogen receptor positive status [ER+]: Secondary | ICD-10-CM

## 2013-12-24 DIAGNOSIS — C50419 Malignant neoplasm of upper-outer quadrant of unspecified female breast: Secondary | ICD-10-CM

## 2013-12-24 LAB — CBC WITH DIFFERENTIAL/PLATELET
BASO%: 0.2 % (ref 0.0–2.0)
Basophils Absolute: 0 10*3/uL (ref 0.0–0.1)
EOS%: 0.2 % (ref 0.0–7.0)
Eosinophils Absolute: 0 10*3/uL (ref 0.0–0.5)
HCT: 36.7 % (ref 34.8–46.6)
HGB: 12.3 g/dL (ref 11.6–15.9)
LYMPH%: 6.8 % — ABNORMAL LOW (ref 14.0–49.7)
MCH: 29.6 pg (ref 25.1–34.0)
MCHC: 33.5 g/dL (ref 31.5–36.0)
MCV: 88.2 fL (ref 79.5–101.0)
MONO#: 1.7 10*3/uL — ABNORMAL HIGH (ref 0.1–0.9)
MONO%: 9.7 % (ref 0.0–14.0)
NEUT#: 14.8 10*3/uL — ABNORMAL HIGH (ref 1.5–6.5)
NEUT%: 83.1 % — ABNORMAL HIGH (ref 38.4–76.8)
Platelets: 114 10*3/uL — ABNORMAL LOW (ref 145–400)
RBC: 4.16 10*6/uL (ref 3.70–5.45)
RDW: 13.7 % (ref 11.2–14.5)
WBC: 17.8 10*3/uL — ABNORMAL HIGH (ref 3.9–10.3)
lymph#: 1.2 10*3/uL (ref 0.9–3.3)
nRBC: 0 % (ref 0–0)

## 2013-12-24 NOTE — Progress Notes (Deleted)
Kim [name] [id number] [birth year] 79 y.o. 12/24/2013 9:24 AM  CC  Kim Morale, MD [address] [place] [address] Dr. 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 BeExcell SeltzerPRIOR HISTORY:  SaELVIA [name] a [age].o. female.   1. Who presented for a screening mammogram 6 months ago that revealed a small area of calcifications in the upper-outer quadrant of the left breast. She was recommended a short term followup mammogram which was performed in January 2015. The mammogram revealed slight increase in the number of calcifications. Because of this she had a stereotactic biopsy performed on 10/18/2013. The pathology revealed a ductal carcinoma in situ. The tumor was ER positive PR positive. Measuring 1cm. Patient had MRI of the breasts performed which did reveal abnormal left axillary lymph nodes. Patient was seen by Dr. BeExcell Seltzerho recommended partial left mastectomy with sentinel lymph node biopsy/dissection. Patient went on to have lumpectomy with lymph node dissection performed on 11/05/2013. The final pathology revealed 1.5 cm ductal carcinoma in situ with intravascular carcinoma 4 of 10 lymph nodes were positive for metastatic disease. The tumor was ER positive PR positive.  HER-2/neu and Ki-67 were not performed.   2.  Patient had HER-2/neu positive results from pathology on 11/06/13.  Patient underwent PET/CT on 12/12/13 that was negative for metastatic disease.    [facility] her[facility] to begin on 12/17/13  INTERVAL HISTORY:  The patient is here for follow up and evaluation prior to receiving day 1 cycle 1 of 4-6 planned q. three-week doses of docetaxel/carboplatin/trastuzumab, being given in the adjuvant setting. Patient receives docetaxel/carboplatin on day 1 of each 21 day cycle, Neulasta on day 2, and trastuzumab alone on days 8 and 15.  [facility]  She would like some clarification on her nausea medications and her chemotherapy schedule.  She is doing well today.  She took the Dexamethasone yesterday as prescribed.  She denies fevers, chills, nausea, vomiting, constipation, diarrhea, numbness, or any further concerns.     Past Medical History: Past Medical History  Diagnosis Date  . Allergy   . Allergic rhinitis   . Hypothyroidism   . Urinary tract infection   . Diverticulosis   . Hx of adenomatous colonic polyps 02/1999  . Tubulovillous adenoma of colon 12/2009    with HGD  . Low back pain     gets ESI from Dr. MaRennis Harding. Arthritis   . Hypertension   . HOH (hard of hearing)   . Wears hearing aid   . Breast cancer of upper-outer quadrant of left female breast 11/08/13  . CAD (coronary artery disease)     a. 12/2013 Cath/PCI: LM nl, LAD  20p, 41m, 30d, D1 30p, LCX 20p, 80m, OM1 20, Mo2 40p, RCA 30p, 79m (4.0x16 Promus Premier DES), 20d, RPL 30, RPDA 70p, 3m, EF 65-70%.  . Hyperlipidemia     Past Surgical History: Past Surgical History  Procedure Laterality Date  . Abdominal hysterectomy    . Bilateral salpingoophorectomy      see Laurin Coder NP for GYN exams  . Cataract extraction w/ intraocular lens  implant, bilateral  2012    bilateral  . Colonoscopy    . Breast lumpectomy with needle localization  and axillary lymph node dissection Left 11/05/2013    Procedure: LEFT BREAST LUMPECTOMY WITH NEEDLE LOCALIZATION and axillary lymph Node Dissection;  Surgeon: Edward Jolly, MD;  Location: [facility];  Service: General;  Laterality: Left;  . Breast surgery    . Portacath placement Right 12/11/2013    Procedure: INSERTION PORT-A-CATH;  Surgeon: Edward Jolly, MD;  Location: [facility];  Service: General;  Laterality: Right;  Subclavian Vein; REMOVAL OF JP DRAIN LEFT AXILLA    Family History: Family History  Problem Relation Age of Onset  . Alcohol abuse Father   . Kidney failure Mother   . Colon cancer      family    Social History History  Substance Use Topics  . Smoking status: Never Smoker   . Smokeless tobacco: Never Used  . Alcohol Use: No    Allergies: Allergies  Allergen Reactions  . Propoxyphene N-Acetaminophen Swelling    tongue swelling  . Penicillins Swelling    Facial swelling  . Cortisone Rash  . Pseudoephedrine Hcl Er Rash and Other (See Comments)    Face gets red    Current Medications: Current Outpatient Prescriptions  Medication Sig Dispense Refill  . aspirin EC 81 MG EC tablet Take 1 tablet (81 mg total) by mouth daily.      Marland Kitchen atorvastatin (LIPITOR) 40 MG tablet Take 1 tablet (40 mg total) by mouth daily at 6 PM.  30 tablet  6  . benazepril (LOTENSIN) 10 MG tablet Take 1 tablet (10 mg total) by mouth daily.  30 tablet  10  . Calcium Citrate-Vitamin D (CITRACAL MAXIMUM PO) Take 1 tablet by mouth daily.       . clopidogrel (PLAVIX) 75 MG tablet Take 1 tablet (75 mg total) by mouth daily with breakfast.  30 tablet  6  . dexamethasone (DECADRON) 4 MG tablet Take 8 mg by mouth 2 (two) times daily with a meal.       . levothyroxine (SYNTHROID, LEVOTHROID) 125 MCG tablet Take 125 mcg by mouth daily before breakfast.      . lidocaine-prilocaine (EMLA) cream Apply topically as needed.  30 g  6  . metoprolol succinate  (TOPROL XL) 25 MG 24 hr tablet Take 1 tablet (25 mg total) by mouth daily.  30 tablet  6  . Multiple Vitamin (MULTIVITAMIN) tablet Take 1 tablet by mouth daily.        Marland Kitchen oxyCODONE-acetaminophen (PERCOCET/ROXICET) 5-325 MG per tablet Take 1 tablet by mouth every 4 (four) hours as needed for severe pain.      . pantoprazole (PROTONIX) 40 MG tablet Take 1 tablet (40 mg total) by mouth daily at 6 (six) AM.  30 tablet  6  . pegfilgrastim (NEULASTA) 6 MG/0.6ML injection Inject 6 mg into the skin once.      Marland Kitchen acetaminophen (TYLENOL) 650 MG CR tablet Take 650 mg by mouth every 8 (eight) hours as  needed.      . cetirizine (ZYRTEC) 10 MG tablet Take 10 mg by mouth daily as needed for allergies.      Marland Kitchen HYDROcodone-acetaminophen (NORCO) 5-325 MG per tablet Take 1-2 tablets by mouth every 6 (six) hours as needed.  30 tablet  0  . nitroGLYCERIN (NITROSTAT) 0.4 MG SL tablet Place 1 tablet (0.4 mg total) under the tongue every 5 (five) minutes x 3 doses as needed for chest pain.  25 tablet  3  . ondansetron (ZOFRAN) 8 MG tablet Take 8 mg by mouth every 8 (eight) hours as needed for nausea or vomiting.      . prochlorperazine (COMPAZINE) 10 MG tablet Take 10 mg by mouth every 6 (six) hours as needed for nausea or vomiting.      . [DISCONTINUED] ferrous sulfate 325 (65 FE) MG tablet Take 325 mg by mouth 2 (two) times daily.         No current facility-administered medications for this visit.         REVIEW OF SYSTEMS: A 10 point review of systems was conducted and is otherwise negative except for what is noted above.     PHYSICAL EXAMINATION: There were no vitals taken for this visit. GENERAL: Patient is a well appearing female in no acute distress.  Hard of hearing. HEENT:  Sclerae anicteric.  Oropharynx clear and moist. No ulcerations or evidence of oropharyngeal candidiasis. Neck is supple.  NODES:  No cervical, supraclavicular, or axillary lymphadenopathy palpated.  BREAST EXAM:  Left lumpectomy site  and axillary scars are healing well, no redness or sign of infection.   LUNGS:  Clear to auscultation bilaterally.  No wheezes or rhonchi. HEART:  Regular rate and rhythm. No murmur appreciated. ABDOMEN:  Soft, nontender.  Positive, normoactive bowel sounds. No organomegaly palpated. MSK:  No focal spinal tenderness to palpation. Full range of motion bilaterally in the upper extremities. EXTREMITIES:  No peripheral edema.   SKIN:  Clear with no obvious rashes or skin changes. No nail dyscrasia.  Port covered with emla cream. NEURO:  Nonfocal. Well oriented.  Appropriate affect. ECOG PERFORMANCE STATUS: 1 - Symptomatic but completely ambulatory  STUDIES/RESULTS: Mr Breast Bilateral W Wo Contrast  10/26/2013   CLINICAL DATA:  79 year old female with recently diagnosed DCIS of the left breast post stereotactic biopsy of a cluster of calcifications measuring up to 1.4 cm.  EXAM: BILATERAL BREAST MRI WITH AND WITHOUT CONTRAST  LABS:  BUN and creatinine were obtained on site at [facility] at  [address]  Results:  BUN 19 mg/dL,  Creatinine 0.9 mg/dL.  TECHNIQUE: Multiplanar, multisequence MR images of both breasts were obtained prior to and following the intravenous administration of 21ml of MultiHance.  THREE-DIMENSIONAL MR IMAGE RENDERING ON INDEPENDENT WORKSTATION:  Three-dimensional MR images were rendered by post-processing of the original MR data on an independent workstation. The three-dimensional MR images were interpreted, and findings are reported in the following complete MRI report for this study. Three dimensional images were evaluated at the independent DynaCad workstation  COMPARISON:  Previous exams  FINDINGS: Breast composition: b.  Scattered fibroglandular tissue.  Background parenchymal enhancement: Mild.  Right breast: No suspicious rapidly enhancing masses or abnormal areas of enhancement are seen in the right breast to suggest malignancy.  Left breast: Rim enhancing post  biopsy hematoma containing clip artifact is present in the upper-outer left breast. This measures 1.1 cm AP, 1.1 cm transverse, and up to 3.3 cm craniocaudal. No additional suspicious  rapidly enhancing masses or abnormal areas of enhancement are seen in the left breast although patient motion mildly limits evaluation.  Lymph nodes: A rounded morphologically abnormal lymph node is present in the left axilla measuring up to 1.2 cm.  Ancillary findings: A splenule is incidentally seen along the lateral margin of the spleen.  IMPRESSION: 1. Biopsy proven DCIS in the upper-outer left breast, within associated rim enhancing hematoma measuring up to 3.3 cm craniocaudal. No definite additional sites of disease are seen in the left breast, although motion mildly limits evaluation.  2.  Enlarged morphologically abnormal lymph node in the left axilla.  3.  No MRI evidence of malignancy in the right breast.  RECOMMENDATION: 1. A second-look ultrasound (with possible biopsy) of the left axilla is recommended for further evaluation/characterization of the morphologically abnormal lymph node seen on MRI.  2.  Treatment plan for known left breast malignancy.  BI-RADS CATEGORY  6: Known biopsy-proven malignancy - appropriate action should be taken.   Electronically Signed   By: Everlean Alstrom M.D.   On: 10/26/2013 14:35   Mm Digital Diagnostic Unilat R  10/26/2013   CLINICAL DATA:  79 year old female with recently diagnosed DCIS of the left breast. Screening mammogram of the right breast.  EXAM: DIGITAL DIAGNOSTIC  RIGHT MAMMOGRAM WITH CAD  COMPARISON:  Previous exams.  ACR Breast Density Category b: There are scattered areas of fibroglandular density.  FINDINGS: No suspicious masses or calcifications are seen in the right breast. There is no mammographic evidence of malignancy in the right breast.  Mammographic images were processed with CAD.  IMPRESSION: No mammographic evidence of malignancy in the right breast.   RECOMMENDATION: 1.  Treatment plan for known left breast malignancy.  2. Screening mammogram in one year.(Code:[id number]-B-01Y)  I have discussed the findings and recommendations with the patient. Results were also provided in writing at the conclusion of the visit. If applicable, a reminder letter will be sent to the patient regarding the next appointment.  BI-RADS CATEGORY  1: Negative.   Electronically Signed   By: Everlean Alstrom M.D.   On: 10/26/2013 13:51   Mm Lt Breast Bx W Loc Dev 1st Lesion Image Bx Spec Stereo Guide  10/19/2013   CLINICAL DATA:  79 year old female for tissue sampling of developing calcifications in the upper outer left breast  EXAM: LEFT STEREOTACTIC CORE NEEDLE BIOPSY  COMPARISON:  Previous exams.  FINDINGS: The patient and I discussed the procedure of stereotactic-guided biopsy including benefits and alternatives. We discussed the high likelihood of a successful procedure. We discussed the risks of the procedure including infection, bleeding, tissue injury, clip migration, and inadequate sampling. Informed written consent was given. The usual time out protocol was performed immediately prior to the procedure.  Using sterile technique and 2% Lidocaine as local anesthetic, under stereotactic guidance, a 9 gauge vacuum assisted needle device was used to perform core needle biopsy of calcifications in the upper outer left breast using a superior approach. Specimen radiograph was performed showing calcifications. Specimens with calcifications are identified for pathology.  At the conclusion of the procedure, a ribbon shaped tissue marker clip was deployed into the biopsy cavity. Follow-up 2-view mammogram confirmed clip placement to be satisfactory.  IMPRESSION: Stereotactic-guided biopsy of left breast calcifications. No apparent complications.  Final pathology demonstrates DCIS.  Histology correlates with imaging findings.  The patient was contacted by phone on 10/19/2013. and these results  given to her which she understood. Her questions were answered.  The patient had  no complaints with her biopsy site. .  Recommend surgery/oncology consultation. An appointment with Dr. Excell Seltzer [facility] surgery) has been scheduled for 10/26/2013 and the patient informed.  Recommend breast MRI. The patient will be contacted by our office with her appointment.   Electronically Signed   By: Hassan Rowan M.D.   On: 10/19/2013 12:50   [place] Lt Breast Bx W Loc Dev 1st Lesion Img Bx Spec US Guide  11/01/2013   ADDENDUM REPORT: 11/01/2013 15:47  ADDENDUM: Pathology results: Pathology results from the left axillary lymph node biopsy revealed metastatic invasive mammary carcinoma. This is concordant with the imaging findings. The patient has been notified of the results. She is doing well and denies any biopsy site complications.  Treatment plan for her known left breast cancer is recommended. The patient has been instructed to call the [place] with any questions or concerns.   Electronically Signed   By: Everlean Alstrom M.D.   On: 11/01/2013 15:47   11/01/2013   CLINICAL DATA:  79 year old female with recently diagnosed ductal carcinoma in situ of the left breast. Morphologically abnormal lymph nodes seen in the left axilla on recent breast MRI.  EXAM: ULTRASOUND GUIDED LEFT BREAST (AXILLA) CORE NEEDLE BIOPSY WITH VACUUM ASSIST  COMPARISON:  Previous exams.  PROCEDURE: I met with the patient and we discussed the procedure of ultrasound-guided biopsy, including benefits and alternatives. We discussed the high likelihood of a successful procedure. We discussed the risks of the procedure including infection, bleeding, tissue injury, clip migration, and inadequate sampling. Informed written consent was given. The usual time-out protocol was performed immediately prior to the procedure.  Using sterile technique and 2% Lidocaine as local anesthetic, under direct ultrasound visualization, a 12 gauge  vacuum-assisteddevice was used to perform biopsy of a morphologically abnormal lymph node in the low left axilla using a lateral approach.  IMPRESSION: Ultrasound-guided biopsy of a morphologically abnormal lymph node in the left axilla. No apparent complications.  Electronically Signed: By: Everlean Alstrom M.D. On: 10/31/2013 10:55   Mm Lt Plc Breast Loc Dev   1st Lesion  Inc Mammo Guide  11/05/2013   CLINICAL DATA:  Preoperative localization for surgical excision of the left breast DCIS.  EXAM: NEEDLE LOCALIZATION OF THE left BREAST WITH MAMMO GUIDANCE  COMPARISON:  Previous exams.  FINDINGS: Patient presents for needle localization prior to surgical excision of left breast DCIS. I met with the patient and we discussed the procedure of needle localization including benefits and alternatives. We discussed the high likelihood of a successful procedure. We discussed the risks of the procedure, including infection, bleeding, tissue injury, and further surgery. Informed, written consent was given. The usual time-out protocol was performed immediately prior to the procedure.  Using mammographic guidance, sterile technique, 2% lidocaine and a 5 cm modified Kopans needle, the clip was localized using lateral approach. The films were marked for Dr. Excell Seltzer.  Specimen radiograph was performed at surgery and confirms the clip an intact wire to be present in the tissue sample. The specimen was marked for pathology.  IMPRESSION: Needle localization of the left breast. No apparent complications.   Electronically Signed   By: Luberta Robertson M.D.   On: 11/05/2013 17:12     LABS:    Chemistry      Component Value Date/Time   NA 136* 12/20/2013 0154   NA 139 12/17/2013 1028   K 4.5 12/20/2013 0154   K 3.9 12/17/2013 1028   CL 100 12/20/2013 0154  CO2 23 12/20/2013 0154   CO2 22 12/17/2013 1028   BUN 31* 12/20/2013 0154   BUN 20.9 12/17/2013 1028   CREATININE 0.84 12/20/2013 0154   CREATININE 0.9 12/17/2013 1028       Component Value Date/Time   CALCIUM 8.6 12/20/2013 0154   CALCIUM 10.0 12/17/2013 1028   ALKPHOS 59 12/19/2013 1140   ALKPHOS 67 12/17/2013 1028   AST 17 12/19/2013 1140   AST 10 12/17/2013 1028   ALT 36* 12/19/2013 1140   ALT 10 12/17/2013 1028   BILITOT 1.0 12/19/2013 1140   BILITOT 0.78 12/17/2013 1028      Lab Results  Component Value Date   WBC 17.8* 12/24/2013   HGB 12.3 12/24/2013   HCT 36.7 12/24/2013   MCV 88.2 12/24/2013   PLT 114* 12/24/2013   PATHOLOGY Diagnosis 1. Breast, lumpectomy, Left - DUCTAL CARCINOMA IN SITU, 1.5 CM, ASSOCIATED WITH BIOPSY SITE REACTION. - MARGINS NOT INVOLVED BY DUCTAL CARCINOMA IN SITU. - SCATTERED FOCI OF INTRAVASCULAR CARCINOMA. - SEE ONCOLOGY TABLE AND COMMENT. 2. Breast, excision, Left further inferior margin - FIBROCYSTIC CHANGES. - NO MALIGNANCY IDENTIFIED. 3. Lymph nodes, regional resection, Left axillary contents - METASTATIC CARCINOMA IN FOUR OF TEN LYMPH NODES (4/10). Microscopic Comment 1. BREAST, INVASIVE TUMOR, WITH LYMPH NODE SAMPLING Specimen, including laterality and lymph node sampling (sentinel, non-sentinel): Left breast and axillary lymph nodes Procedure: Lumpectomy with axillary lymph nodes Histologic type: See comment Grade: II Tubule formation: 3 Nuclear pleomorphism: 2 Mitotic: 2 Tumor size (gross measurement): 1.5 cm ductal carcinoma in situ, see comment Margins: Free of tumor Invasive, distance to closest margin: Intravascular tumor focally less than 0.1 cm from medial margin In-situ, distance to closest margin: 0.3 cm from anterior margin If margin positive, focally or broadly: N/A Lymphovascular invasion: Present, scattered foci, see comment. Ductal carcinoma in situ: Present Grade: High grade Extensive intraductal component: N/A Lobular neoplasia: No Tumor focality: See comment Treatment effect: No 1 of 3 FINAL for Kim [name], Kim [name] ([id number]) Microscopic Comment(continued) If present, treatment effect  in breast tissue, lymph nodes or both: N/A Extent of tumor: Skin: N/A Nipple: N/A Skeletal muscle: Free of tumor Lymph nodes: Examined: 0 Sentinel 10 Non-sentinel 10 Total Lymph nodes with metastasis: 4 Isolated tumor cells (< 0.2 mm): 0 Micrometastasis: (> 0.2 mm and < 2.0 mm): 0 Macrometastasis: (> 2.0 mm): 4 Extracapsular extension: Present Breast prognostic profile: Case [id number] Estrogen receptor: 100%, strong staining Progesterone receptor: 4%, positive Her 2 neu: N/A Ki-67: N/A Non-neoplastic breast: Fibrocystic changes with focal pseudoangiomatous stromal hyperplasia TNM: See comment Comments: In the area of localization there is biopsy site reaction and several foci of residual ductal carcinoma in situ, estimated at 1.5 cm. There are also scattered foci within the specimen demonstrating lymphatic vascular involvement by carcinoma which focally is less than 0.1 cm from the medial margin of the specimen. Multiple additional sections of the specimen are submitted and show focal lymphatic vascular involvement by carcinoma. No invasive carcinoma is identified. There are ten lymph nodes isolated and four show metastatic carcinoma and in several of the involved nodes there is lymphatic vascular involvement by tumor adjacent to the involved nodes. The case was discussed with Dr. Excell Seltzer on 11/08/13. (JDP:kh 11/08/13) Claudette Laws MD Pathologist, Electronic Signature (Case signed 11/08/2013)  ASSESSMENT/PLAN   79 year old female with  #1 stage III (Tis N2) ductal carcinoma with intravascular carcinoma and 4 of 10 positive lymph nodes. Tumor ER positive PR positive and now HER-2neu positive.    #  2 The negative results of the PET/CT on 12/12/2013 along with the LVEF from the echocardiogram on 12/07/2013 have been discussed with the patient in detail.    #3  I again reviewed her chemotherapy regimen with her in detail, along with how to take her anti-emetics beginning tomorrow.  I  again reviewed the rationale for Herceptin and what Her-2 positivity means.  I also gave her information and discussed the role of Neulasta in her treatment plan.  I recommended she take Tylenol 646m, and Claritin 167mdaily for five days beginning tomorrow morning.  All of this information was given to the patient in her AVS in detail.  She and her husband verbalized understanding.  They know to call if she develops bone pain that becomes uncontrolled and we will prescribe a stronger pain medication.    #4  The patient will return tomorrow for Neulasta and in one week for labs, evaluation, and Herceptin therapy.    All questions were answered. The patient knows to call the clinic with any problems, questions or concerns. We can certainly see the patient much sooner if necessary.  I spent 25 minutes counseling the patient face to face. The total time spent in the appointment was 30 minutes.  LiMinette [phone]/23/2015, 9:24 AM

## 2013-12-24 NOTE — Progress Notes (Addendum)
Hematology and Oncology Follow Up Visit  Kim [name] [id number] [birth year] 79 y.o. 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     Prior Therapy:   1. Patient presented for a screening mammogram 6 months ago that revealed a small area of calcifications in the upper-outer quadrant of the left breast. She was recommended a short term followup mammogram which was performed in January 2015. The mammogram revealed slight increase in the number of calcifications. Because of this she had a stereotactic biopsy performed on 10/18/2013. The pathology revealed a ductal carcinoma in situ. The tumor was ER positive PR positive. Measuring 1cm. Patient had MRI of the breasts performed which did reveal abnormal left axillary lymph nodes. Patient was seen by Dr. Excell Seltzer who recommended partial left mastectomy with sentinel lymph node biopsy/dissection. Patient went on to have lumpectomy with lymph node dissection performed on 11/05/2013. The final pathology revealed 1.5 cm ductal carcinoma in situ with foci of intravascular carcinoma, 4 of 10 lymph nodes were positive for metastatic disease. The tumor was ER positive PR positive. HER-2/neu and Ki-67 were not performed.   2. Patient had HER-2/neu positive results from pathology on 11/06/13. Patient underwent PET/CT on 12/12/13 that was negative for metastatic disease.    Current therapy: [place] (with weekly Herceptin) cycle 1 day 8  Interim History:  Patient is here prior to receiving day 8 cycle 1 of 6 planned q. three-week doses of of [place], being given in the adjuvant setting. Patient receives Taxotere/Carboplatin on day 1 of each 21 day cycle, Neulasta on day 2, and Herceptin alone on days 8 and 15.  She is due for Herceptin today.  She underwent treatment last week and received Neulasta on Tuesday.  On Wednesday, March, 18 she started to feel some chest  pressure and called our office.  She was recommended to go to the emergency room.  She had an EKG done that was concerning for anterolateral ischemia and a heparin and nitro gtt was started. She went to have a cardiac catheterization.  Her RCA was 99% blocked and she is s/p PCI and drug eluting stent placement to that area.  She was discharged from the hospital on Friday, March 20.  She was discharged on baby aspirin and daily Plavix.  She developed diarrhea with blood in her stool on Saturday, March 21.  She denies any further bleeding from her rectum or any pouring of blood from her rectum.  She has known hemorrhoids, and hasn't had any further bleeding in her stool since, however she hasn't had a bowel movement since.  She feels weak and tired today, however, she denies fevers, chills, nausea, vomiting, numbness, tingling, skin changes, mouth pain or any other concern.  She denies any other bleeding from her gums, from her nose, or any other area.  She developed back pain on Saturday, and Tylenol didn't help.  She took Percocet for the pain instead.  She is taking one tablet every four hours and her pain has resolved.  She has some muscle aches and dizziness since starting her statin and beta blocker.  She is normotensive today.  Otherwise, a 10 point ROS is neg.    Medications:  Current Outpatient Prescriptions  Medication Sig Dispense Refill  . aspirin EC 81 MG EC tablet Take 1 tablet (81 mg total) by mouth daily.      Marland Kitchen atorvastatin (LIPITOR) 40 MG tablet Take  1 tablet (40 mg total) by mouth daily at 6 PM.  30 tablet  6  . benazepril (LOTENSIN) 10 MG tablet Take 1 tablet (10 mg total) by mouth daily.  30 tablet  10  . Calcium Citrate-Vitamin D (CITRACAL MAXIMUM PO) Take 1 tablet by mouth daily.       . clopidogrel (PLAVIX) 75 MG tablet Take 1 tablet (75 mg total) by mouth daily with breakfast.  30 tablet  6  . dexamethasone (DECADRON) 4 MG tablet Take 8 mg by mouth 2 (two) times daily with a meal.        . levothyroxine (SYNTHROID, LEVOTHROID) 125 MCG tablet Take 125 mcg by mouth daily before breakfast.      . lidocaine-prilocaine (EMLA) cream Apply topically as needed.  30 g  6  . metoprolol succinate (TOPROL XL) 25 MG 24 hr tablet Take 1 tablet (25 mg total) by mouth daily.  30 tablet  6  . Multiple Vitamin (MULTIVITAMIN) tablet Take 1 tablet by mouth daily.        Marland Kitchen oxyCODONE-acetaminophen (PERCOCET/ROXICET) 5-325 MG per tablet Take 1 tablet by mouth every 4 (four) hours as needed for severe pain.      . pantoprazole (PROTONIX) 40 MG tablet Take 1 tablet (40 mg total) by mouth daily at 6 (six) AM.  30 tablet  6  . pegfilgrastim (NEULASTA) 6 MG/0.6ML injection Inject 6 mg into the skin once.      Marland Kitchen acetaminophen (TYLENOL) 650 MG CR tablet Take 650 mg by mouth every 8 (eight) hours as needed.      . cetirizine (ZYRTEC) 10 MG tablet Take 10 mg by mouth daily as needed for allergies.      Marland Kitchen HYDROcodone-acetaminophen (NORCO) 5-325 MG per tablet Take 1-2 tablets by mouth every 6 (six) hours as needed.  30 tablet  0  . nitroGLYCERIN (NITROSTAT) 0.4 MG SL tablet Place 1 tablet (0.4 mg total) under the tongue every 5 (five) minutes x 3 doses as needed for chest pain.  25 tablet  3  . ondansetron (ZOFRAN) 8 MG tablet Take 8 mg by mouth every 8 (eight) hours as needed for nausea or vomiting.      . prochlorperazine (COMPAZINE) 10 MG tablet Take 10 mg by mouth every 6 (six) hours as needed for nausea or vomiting.      . [DISCONTINUED] ferrous sulfate 325 (65 FE) MG tablet Take 325 mg by mouth 2 (two) times daily.         No current facility-administered medications for this visit.     Allergies:  Allergies  Allergen Reactions  . Propoxyphene N-Acetaminophen Swelling    tongue swelling  . Penicillins Swelling    Facial swelling  . Cortisone Rash  . Pseudoephedrine Hcl Er Rash and Other (See Comments)    Face gets red    Past Medical History, Surgical history, Social history, and Family  History were reviewed and updated.  Review of Systems: A 10 point review of systems was conducted and is otherwise negative except for what is noted above.     Physical Exam: Blood pressure 115/73, pulse 71, temperature 98.1 F (36.7 C), temperature source Oral, resp. rate 18, height _0  (1.727 m), weight 174 lb 14.4 oz (79.334 kg). GENERAL: Patient is a well appearing female in no acute distress HEENT:  Sclerae anicteric.  Oropharynx clear and moist. No ulcerations or evidence of oropharyngeal candidiasis. Neck is supple.  NODES:  No cervical, supraclavicular, or axillary lymphadenopathy  palpated.  BREAST EXAM:  Deferred. LUNGS:  Clear to auscultation bilaterally.  No wheezes or rhonchi. HEART:  Regular rate and rhythm. Systolic murmur. ABDOMEN:  Soft, nontender.  Positive, normoactive bowel sounds. No organomegaly palpated. MSK:  No focal spinal tenderness to palpation. Full range of motion bilaterally in the upper extremities. EXTREMITIES:  No peripheral edema.   SKIN:  Clear with no obvious rashes or skin changes. No nail dyscrasia. NEURO:  Nonfocal. Well oriented.  Appropriate affect. ECOG: 1    Lab Results: Lab Results  Component Value Date   WBC 17.8* 12/24/2013   HGB 12.3 12/24/2013   HCT 36.7 12/24/2013   MCV 88.2 12/24/2013   PLT 114* 12/24/2013     Chemistry      Component Value Date/Time   NA 136* 12/20/2013 0154   NA 139 12/17/2013 1028   K 4.5 12/20/2013 0154   K 3.9 12/17/2013 1028   CL 100 12/20/2013 0154   CO2 23 12/20/2013 0154   CO2 22 12/17/2013 1028   BUN 31* 12/20/2013 0154   BUN 20.9 12/17/2013 1028   CREATININE 0.84 12/20/2013 0154   CREATININE 0.9 12/17/2013 1028      Component Value Date/Time   CALCIUM 8.6 12/20/2013 0154   CALCIUM 10.0 12/17/2013 1028   ALKPHOS 59 12/19/2013 1140   ALKPHOS 67 12/17/2013 1028   AST 17 12/19/2013 1140   AST 10 12/17/2013 1028   ALT 36* 12/19/2013 1140   ALT 10 12/17/2013 1028   BILITOT 1.0 12/19/2013 1140   BILITOT 0.78  12/17/2013 1028       Assessment and Plan:  Patient is a 79 year old female with  1.  Stage III triple positive invasive ductal carcinoma of the left breast: The patient is currently day 8 of cycle 1 of [place] chemotherapy with Neulasta support.  She tolerated the treatment well in regards to nausea, vomiting, diet changes, or dehydration.  However, due to the RCA blockage and PCI requirement, we will await a Dr. Haroldine Laws appointment and evaluation before giving the patient any further Herceptin at this point.  Chemotherapy will be delayed until 01/21/2014.  I reviewed her labs with her today, they are stable.    2. CAD s/p RCA PCI with drug eluting stent placement.  The patient was started on many new medications, including a beta blocker, statin, aspirin, and Plavix. She is having some muscle aches, and occasional dizziness.  She also had the episode of blood in her stool x 1 over the weekend.  She is hemodynamically stable today and her hemoglobin is stable as well.  I recommended she f/u with cardiology and discuss her concerns regarding the side effects from her new cardiac medications.   Her primary cardiologist is Dr. Angelena Form.    3.  Hematochezia:  After review with Dr. Humphrey Rolls, due to the patient having a h/o GI bleeding and an episode of blood in the stool, we will send the patient back to see Dr. Fuller Plan in gastroenterology.  I recommended colace to the patient due to her h/o hemorrhoids, and she has not had another episode since.    After review with Dr. Humphrey Rolls, due to the above issues, we will delay Mrs. Calkin next treatment until 01/21/14.  She will under go labs, evaluation, and possibly treatment.  In the meantime she will be evaluated by Dr. Haroldine Laws for recommendations on Herceptin therapy.    All of the patient's questions were answered.  She knows to call in the interim  if she has any further questions or concerns and we will see her sooner if needed.    I spent 25 minutes counseling  the patient face to face.  The total time spent in the appointment was 30 minutes.  Minette Headland, [place] [phone] 3/24/20151:42 PM  ATTENDING'S ATTESTATION:  I personally reviewed patient's chart, examined patient myself, formulated the impression and treatment plan as documented by Advanced Practitioner's note .   Patient with stage III triple positive left breast cancer. She is status post cycle 1 of TCH. Unfortunately her course was complicated by development of RCA blockage and requirement for PCI. She is now doing much better. 2 2-D cardiac events we will plan on holding her treatment. She will followup with cardiology. Patient is having some hematochezia. She does have prior history of GI bleeding. We will plan on sending the patient to her gastroenterologist.  Patient will return on 01/21/2014 for possible treatment.  Marcy Panning, MD Medical/Oncology [facility] [phone] (beeper) [phone] (Office)  12/30/2013, 9:36 PM

## 2013-12-26 ENCOUNTER — Other Ambulatory Visit: Payer: Self-pay | Admitting: Family Medicine

## 2013-12-26 ENCOUNTER — Telehealth (INDEPENDENT_AMBULATORY_CARE_PROVIDER_SITE_OTHER): Payer: Self-pay | Admitting: *Deleted

## 2013-12-26 NOTE — Telephone Encounter (Signed)
Husband called requesting a rx for Oxycodone. I tried to make sure what he was asking for.  He was a little confused and he said he will call me back when Mrs. Obyrne woke up and I will send another message when I am sure what he is asking for. Thanks.Anderson Malta

## 2013-12-27 ENCOUNTER — Ambulatory Visit: Payer: Medicare Other

## 2013-12-27 ENCOUNTER — Other Ambulatory Visit (INDEPENDENT_AMBULATORY_CARE_PROVIDER_SITE_OTHER): Payer: Self-pay

## 2013-12-27 ENCOUNTER — Other Ambulatory Visit: Payer: Medicare Other

## 2013-12-27 ENCOUNTER — Telehealth (INDEPENDENT_AMBULATORY_CARE_PROVIDER_SITE_OTHER): Payer: Self-pay

## 2013-12-27 DIAGNOSIS — G8918 Other acute postprocedural pain: Secondary | ICD-10-CM

## 2013-12-27 MED ORDER — OXYCODONE-ACETAMINOPHEN 5-325 MG PO TABS: 1.0000 | ORAL_TABLET | Freq: Four times a day (QID) | ORAL | Status: DC | PRN

## 2013-12-27 NOTE — Telephone Encounter (Signed)
Called patient to make aware that pain medication refill for Oxycodone will be at the front desk for pick up.  Patient will send Daughter Aris [place]) to pick up

## 2013-12-27 NOTE — Telephone Encounter (Signed)
Refill approved and patient aware prescription is at front desk for pick up.

## 2013-12-28 ENCOUNTER — Ambulatory Visit: Payer: Medicare Other

## 2013-12-31 ENCOUNTER — Ambulatory Visit: Payer: Medicare Other

## 2013-12-31 ENCOUNTER — Ambulatory Visit: Payer: Medicare Other | Admitting: Adult Health

## 2013-12-31 ENCOUNTER — Other Ambulatory Visit: Payer: Medicare Other

## 2014-01-03 ENCOUNTER — Ambulatory Visit: Payer: Medicare Other

## 2014-01-03 ENCOUNTER — Other Ambulatory Visit: Payer: Medicare Other

## 2014-01-04 ENCOUNTER — Ambulatory Visit: Payer: Medicare Other

## 2014-01-04 ENCOUNTER — Telehealth: Payer: Self-pay | Admitting: *Deleted

## 2014-01-04 ENCOUNTER — Other Ambulatory Visit: Payer: Medicare Other

## 2014-01-04 NOTE — Telephone Encounter (Signed)
Mailed after appt letter to pt. 

## 2014-01-05 ENCOUNTER — Ambulatory Visit: Payer: Medicare Other

## 2014-01-07 ENCOUNTER — Ambulatory Visit: Payer: Medicare Other

## 2014-01-07 ENCOUNTER — Ambulatory Visit: Payer: Medicare Other | Admitting: Adult Health

## 2014-01-07 ENCOUNTER — Other Ambulatory Visit: Payer: Medicare Other

## 2014-01-07 ENCOUNTER — Ambulatory Visit (HOSPITAL_COMMUNITY): Payer: Medicare Other

## 2014-01-08 ENCOUNTER — Ambulatory Visit (HOSPITAL_COMMUNITY)
Admission: RE | Admit: 2014-01-08 | Discharge: 2014-01-08 | Disposition: A | Discharge status: Home / Self Care | Payer: Medicare Other | Source: Ambulatory Visit | Attending: Cardiology | Admitting: Cardiology

## 2014-01-08 ENCOUNTER — Ambulatory Visit: Payer: Medicare Other

## 2014-01-08 ENCOUNTER — Encounter (HOSPITAL_COMMUNITY): Payer: Self-pay

## 2014-01-08 ENCOUNTER — Encounter: Payer: Medicare Other | Admitting: Physician Assistant

## 2014-01-08 VITALS — BP 132/64 | HR 88 | Wt 170.8 lb

## 2014-01-08 DIAGNOSIS — C50412 Malignant neoplasm of upper-outer quadrant of left female breast: Secondary | ICD-10-CM

## 2014-01-08 DIAGNOSIS — E785 Hyperlipidemia, unspecified: Secondary | ICD-10-CM

## 2014-01-08 DIAGNOSIS — C50919 Malignant neoplasm of unspecified site of unspecified female breast: Secondary | ICD-10-CM

## 2014-01-08 DIAGNOSIS — I2510 Atherosclerotic heart disease of native coronary artery without angina pectoris: Secondary | ICD-10-CM | POA: Insufficient documentation

## 2014-01-08 DIAGNOSIS — I421 Obstructive hypertrophic cardiomyopathy: Secondary | ICD-10-CM

## 2014-01-08 DIAGNOSIS — C50419 Malignant neoplasm of upper-outer quadrant of unspecified female breast: Secondary | ICD-10-CM | POA: Insufficient documentation

## 2014-01-08 MED ORDER — ATORVASTATIN CALCIUM 10 MG PO TABS: 10.0000 mg | ORAL_TABLET | Freq: Every day | ORAL | Status: DC

## 2014-01-08 MED ORDER — BENAZEPRIL HCL 10 MG PO TABS: 5.0000 mg | ORAL_TABLET | Freq: Every day | ORAL | Status: DC

## 2014-01-08 MED ORDER — METOPROLOL SUCCINATE ER 50 MG PO TB24: 50.0000 mg | ORAL_TABLET | Freq: Every day | ORAL | Status: DC

## 2014-01-08 NOTE — Progress Notes (Signed)
Patient ID: SHERA LAUBACH, female   DOB: 23-Dec-1934, 79 y.o.   MRN: [id number] PCP: Dr. Sarajane Jews Oncologist: Dr. Humphrey Rolls  79 yo with history of CAD and HOCM presents for cardiology evaluation given use of Herceptin with her breast cancer chemo.  Patient is s/p lumpectomy with lymph node biopsy in 2/15.  4/10 nodes positive.  She was started on docetaxol/carboplatin/Herceptin in 3/15 with plan for 6 cycles chemo. She had a tough time with her 1st chemotherapy session and felt very weak afterwards.  Shortly after, she developed unstable angina and ended up getting a DES to the mid RCA.  She has not had chemotherapy since that time. Patient additionally has a history of HOCM.  This has been recognized on prior echoes.  She has severe asymmetric basal septal hypertrophy and SAM with LVOT gradient peak 58 mmHg on last echo along with moderate MR.   Patient had pain all over after starting Lipitor 40 mg daily post-cath.  She is now cutting it into 4ths and is able to tolerate this dose (10 mg daily).  She denies exertional dyspnea or chest pain.  She can climb a flight of steps without problems.  No orthopnea/PND.  No lightheadedness/syncope.    ECG (3/15): NSR, lateral T wave inversions  Labs (3/15): K 4.5, creatinine 0.84, LDL 91  PMH: 1. CAD: Unstable angina 3/15 with LHC showing 99% mRCA stenosis, treated with DES to mRCA.  2. Hypothyroidism 3. Diverticulosis 4. HTN 5. H/o TAH/BSO 6. Breast cancer: s/p lumpectomy with lymph node biopsy in 2/15.  4/10 nodes positive.  She was started on docetaxol/carboplatin/Herceptin in 3/15 with plan for 6 cycles chemo.  7. Hypertrophic obstructive cardiomyopathy: Echo (3/15) with severe focal basal septal hypertrophy (22 mm), narrow LV outflow tract with mitral valve SAM and 56 mmHg peak LVOT resting gradient, EF 60-65%, moderate MR, moderate LAE, normal RV, lateral s' 10.4 cm/sec.  Patient says that her 2 grown sons has had echoes to screen for HOCM.   SH: Married, 2  children, retired, nonsmoker.   FH: No family history of HOCM or sudden death.  There is a family history of CAD.   ROS: All systems reviewed and negative except as per HPI.    Current Outpatient Prescriptions  Medication Sig Dispense Refill  . acetaminophen (TYLENOL) 650 MG CR tablet Take 650 mg by mouth every 8 (eight) hours as needed.      Marland Kitchen aspirin EC 81 MG EC tablet Take 1 tablet (81 mg total) by mouth daily.      Marland Kitchen atorvastatin (LIPITOR) 10 MG tablet Take 1 tablet (10 mg total) by mouth daily at 6 PM.  30 tablet  6  . benazepril (LOTENSIN) 10 MG tablet Take 0.5 tablets (5 mg total) by mouth daily.  15 tablet  1  . Calcium Citrate-Vitamin D (CITRACAL MAXIMUM PO) Take 1 tablet by mouth daily.       . cetirizine (ZYRTEC) 10 MG tablet Take 10 mg by mouth daily as needed for allergies.      Marland Kitchen clopidogrel (PLAVIX) 75 MG tablet Take 1 tablet (75 mg total) by mouth daily with breakfast.  30 tablet  6  . dexamethasone (DECADRON) 4 MG tablet Take 8 mg by mouth 2 (two) times daily with a meal.       . HYDROcodone-acetaminophen (NORCO) 5-325 MG per tablet Take 1-2 tablets by mouth every 6 (six) hours as needed.  30 tablet  0  . levothyroxine (SYNTHROID, LEVOTHROID) 125 MCG tablet  Take 125 mcg by mouth daily before breakfast.      . lidocaine-prilocaine (EMLA) cream Apply topically as needed.  30 g  6  . metoprolol succinate (TOPROL XL) 50 MG 24 hr tablet Take 1 tablet (50 mg total) by mouth daily.  30 tablet  6  . Multiple Vitamin (MULTIVITAMIN) tablet Take 1 tablet by mouth daily.        . nitroGLYCERIN (NITROSTAT) 0.4 MG SL tablet Place 1 tablet (0.4 mg total) under the tongue every 5 (five) minutes x 3 doses as needed for chest pain.  25 tablet  3  . ondansetron (ZOFRAN) 8 MG tablet Take 8 mg by mouth every 8 (eight) hours as needed for nausea or vomiting.      Marland Kitchen oxyCODONE-acetaminophen (PERCOCET/ROXICET) 5-325 MG per tablet Take 1 tablet by mouth every 6 (six) hours as needed for severe pain.   30 tablet    . pantoprazole (PROTONIX) 40 MG tablet Take 1 tablet (40 mg total) by mouth daily at 6 (six) AM.  30 tablet  6  . pegfilgrastim (NEULASTA) 6 MG/0.6ML injection Inject 6 mg into the skin once.      . prochlorperazine (COMPAZINE) 10 MG tablet Take 10 mg by mouth every 6 (six) hours as needed for nausea or vomiting.      . [DISCONTINUED] ferrous sulfate 325 (65 FE) MG tablet Take 325 mg by mouth 2 (two) times daily.         No current facility-administered medications for this encounter.   BP 132/64  Pulse 88  Wt 170 lb 12.8 oz (77.474 kg)  SpO2 98% General: NAD Neck: No JVD, no thyromegaly or thyroid nodule.  Lungs: Clear to auscultation bilaterally with normal respiratory effort. CV: Nondisplaced PMI.  Heart regular S1/S2, no S3/S4, 2/6 SEM RUSB.  No peripheral edema.  No carotid bruit.  Normal pedal pulses.  Abdomen: Soft, nontender, no hepatosplenomegaly, no distention.  Skin: Intact without lesions or rashes.  Neurologic: Alert and oriented x 3.  Psych: Normal affect. Extremities: No clubbing or cyanosis.  HEENT: Normal.   Assessment/Plan: 1. CAD: Status post PCI for unstable angina in 3/15 with DES to [facility].  - Continue ASA 81, statin, Plavix, and Toprol XL.  - Should participate in cardiac rehab.  2. Hyperlipidemia: Myalgias with atoravastatin 40 mg daily but she can tolerate 10 mg daily.  I will have her continue this.  Lipids/LFTs in 2 months.  3. Hypertrophic obstructive cardiomyopathy: No family history of HOCM or sudden death (interestingly, her husband has HOCM).  She has a moderate LVOT gradient with mitral valve SAM and moderate MR.  She does not have significant exertional symptoms. She has never passed out.   - I am going to get a 24 hour holter monitor to screen for ventricular arrhythmias.  - I am going to get a cardiac MRI to assess scar burden in the myocardium.  - I will increase Toprol XL to 50 mg daily to help lower LVOT gradient and will also decrease  benazepril to 5 mg daily with the ultimate goal of stopping this medication (afterload reduction not ideal with HOCM).   - Per patient, her 2 sons (in their 40s) have had screening echoes to look for hypertrophic cardiomyopathy and did not show signs of HOCM.   4. Breast cancer/Herceptin: Patient had a baseline echo in 3/15 prior to receiving 1st dose of Herceptin-containing chemotherapy, then had unstable angina with PCI.  I would like to repeat her echo prior  to her next dose of Herceptin and reassess EF and strain/lateral s'.  I will arrange to have this done in the next week or so.   [name] 01/09/2014

## 2014-01-08 NOTE — Patient Instructions (Signed)
[facility] will contact you this week to have a 24 hr Holter monitor placed to monitor your heart rate and rhythm.  Our services will also contact you soon to schedule a cardiac MRI once your insurance has approved this test.  Do the following things EVERYDAY: 1) Weigh yourself in the morning before breakfast. Write it down and keep it in a log. 2) Take your medicines as prescribed 3) Eat low salt foods-Limit salt (sodium) to 2000 mg per day.  4) Stay as active as you can everyday 5) Limit all fluids for the day to less than 2 liters

## 2014-01-09 DIAGNOSIS — C50919 Malignant neoplasm of unspecified site of unspecified female breast: Secondary | ICD-10-CM | POA: Insufficient documentation

## 2014-01-09 DIAGNOSIS — I421 Obstructive hypertrophic cardiomyopathy: Secondary | ICD-10-CM | POA: Insufficient documentation

## 2014-01-10 ENCOUNTER — Ambulatory Visit (HOSPITAL_COMMUNITY)
Admission: RE | Admit: 2014-01-10 | Discharge: 2014-01-10 | Disposition: A | Discharge status: Home / Self Care | Payer: Medicare Other | Source: Ambulatory Visit | Attending: Internal Medicine | Admitting: Internal Medicine

## 2014-01-10 ENCOUNTER — Other Ambulatory Visit (HOSPITAL_COMMUNITY): Payer: Self-pay | Admitting: Internal Medicine

## 2014-01-10 DIAGNOSIS — I509 Heart failure, unspecified: Secondary | ICD-10-CM

## 2014-01-10 DIAGNOSIS — E785 Hyperlipidemia, unspecified: Secondary | ICD-10-CM | POA: Insufficient documentation

## 2014-01-10 DIAGNOSIS — C50919 Malignant neoplasm of unspecified site of unspecified female breast: Secondary | ICD-10-CM | POA: Insufficient documentation

## 2014-01-10 DIAGNOSIS — I2510 Atherosclerotic heart disease of native coronary artery without angina pectoris: Secondary | ICD-10-CM | POA: Insufficient documentation

## 2014-01-10 DIAGNOSIS — I059 Rheumatic mitral valve disease, unspecified: Secondary | ICD-10-CM

## 2014-01-10 DIAGNOSIS — I10 Essential (primary) hypertension: Secondary | ICD-10-CM | POA: Insufficient documentation

## 2014-01-10 NOTE — Progress Notes (Signed)
  Echocardiogram 2D Echocardiogram has been performed.  [place] 01/10/2014, 9:55 AM

## 2014-01-14 ENCOUNTER — Other Ambulatory Visit: Payer: Medicare Other

## 2014-01-14 ENCOUNTER — Ambulatory Visit: Payer: Medicare Other | Admitting: Adult Health

## 2014-01-14 ENCOUNTER — Ambulatory Visit: Payer: Medicare Other

## 2014-01-18 ENCOUNTER — Ambulatory Visit: Payer: Medicare Other

## 2014-01-21 ENCOUNTER — Other Ambulatory Visit (HOSPITAL_BASED_OUTPATIENT_CLINIC_OR_DEPARTMENT_OTHER): Payer: Medicare Other

## 2014-01-21 ENCOUNTER — Ambulatory Visit (HOSPITAL_BASED_OUTPATIENT_CLINIC_OR_DEPARTMENT_OTHER): Payer: Medicare Other | Admitting: Adult Health

## 2014-01-21 ENCOUNTER — Ambulatory Visit (HOSPITAL_BASED_OUTPATIENT_CLINIC_OR_DEPARTMENT_OTHER): Payer: Medicare Other

## 2014-01-21 ENCOUNTER — Encounter: Payer: Self-pay | Admitting: Adult Health

## 2014-01-21 VITALS — BP 142/76 | HR 65 | Temp 97.9°F | Resp 18 | Ht 68.0 in | Wt 151.8 lb

## 2014-01-21 DIAGNOSIS — C773 Secondary and unspecified malignant neoplasm of axilla and upper limb lymph nodes: Secondary | ICD-10-CM

## 2014-01-21 DIAGNOSIS — I2510 Atherosclerotic heart disease of native coronary artery without angina pectoris: Secondary | ICD-10-CM

## 2014-01-21 DIAGNOSIS — C50412 Malignant neoplasm of upper-outer quadrant of left female breast: Secondary | ICD-10-CM

## 2014-01-21 DIAGNOSIS — C50419 Malignant neoplasm of upper-outer quadrant of unspecified female breast: Secondary | ICD-10-CM

## 2014-01-21 DIAGNOSIS — Z170 Estrogen receptor positive status [ER+]: Secondary | ICD-10-CM

## 2014-01-21 DIAGNOSIS — Z5112 Encounter for antineoplastic immunotherapy: Secondary | ICD-10-CM

## 2014-01-21 LAB — CBC WITH DIFFERENTIAL/PLATELET
BASO%: 0.2 % (ref 0.0–2.0)
Basophils Absolute: 0 10*3/uL (ref 0.0–0.1)
EOS%: 0.1 % (ref 0.0–7.0)
Eosinophils Absolute: 0 10*3/uL (ref 0.0–0.5)
HCT: 32.5 % — ABNORMAL LOW (ref 34.8–46.6)
HGB: 10.8 g/dL — ABNORMAL LOW (ref 11.6–15.9)
LYMPH%: 9.7 % — ABNORMAL LOW (ref 14.0–49.7)
MCH: 31.1 pg (ref 25.1–34.0)
MCHC: 33.2 g/dL (ref 31.5–36.0)
MCV: 93.7 fL (ref 79.5–101.0)
MONO#: 0.8 10*3/uL (ref 0.1–0.9)
MONO%: 8.3 % (ref 0.0–14.0)
NEUT#: 7.4 10*3/uL — ABNORMAL HIGH (ref 1.5–6.5)
NEUT%: 81.7 % — ABNORMAL HIGH (ref 38.4–76.8)
Platelets: 153 10*3/uL (ref 145–400)
RBC: 3.47 10*6/uL — ABNORMAL LOW (ref 3.70–5.45)
RDW: 19.7 % — ABNORMAL HIGH (ref 11.2–14.5)
WBC: 9.1 10*3/uL (ref 3.9–10.3)
lymph#: 0.9 10*3/uL (ref 0.9–3.3)

## 2014-01-21 LAB — COMPREHENSIVE METABOLIC PANEL (CC13)
ALT: 8 U/L (ref 0–55)
AST: 10 U/L (ref 5–34)
Albumin: 3.9 g/dL (ref 3.5–5.0)
Alkaline Phosphatase: 61 U/L (ref 40–150)
Anion Gap: 11 mEq/L (ref 3–11)
BUN: 29.4 mg/dL — ABNORMAL HIGH (ref 7.0–26.0)
CO2: 23 mEq/L (ref 22–29)
Calcium: 9.5 mg/dL (ref 8.4–10.4)
Chloride: 106 mEq/L (ref 98–109)
Creatinine: 1 mg/dL (ref 0.6–1.1)
Glucose: 139 mg/dl (ref 70–140)
Potassium: 3.3 mEq/L — ABNORMAL LOW (ref 3.5–5.1)
Sodium: 141 mEq/L (ref 136–145)
Total Bilirubin: 0.63 mg/dL (ref 0.20–1.20)
Total Protein: 6.4 g/dL (ref 6.4–8.3)

## 2014-01-21 MED ORDER — DIPHENHYDRAMINE HCL 25 MG PO CAPS
50.0000 mg | ORAL_CAPSULE | Freq: Once | ORAL | Status: AC
  Administered 2014-01-21: 50 mg via ORAL

## 2014-01-21 MED ORDER — HEPARIN SOD (PORK) LOCK FLUSH 100 UNIT/ML IV SOLN
500.0000 [IU] | Freq: Once | INTRAVENOUS | Status: AC | PRN
  Administered 2014-01-21: 500 [IU]
  Filled 2014-01-21: qty 5

## 2014-01-21 MED ORDER — ACETAMINOPHEN 325 MG PO TABS
ORAL_TABLET | ORAL | Status: AC
  Filled 2014-01-21: qty 2

## 2014-01-21 MED ORDER — DIPHENHYDRAMINE HCL 25 MG PO CAPS
ORAL_CAPSULE | ORAL | Status: AC
  Filled 2014-01-21: qty 2

## 2014-01-21 MED ORDER — SODIUM CHLORIDE 0.9 % IJ SOLN
10.0000 mL | INTRAMUSCULAR | Status: DC | PRN
  Administered 2014-01-21: 10 mL
  Filled 2014-01-21: qty 10

## 2014-01-21 MED ORDER — ACETAMINOPHEN 325 MG PO TABS
650.0000 mg | ORAL_TABLET | Freq: Once | ORAL | Status: AC
  Administered 2014-01-21: 650 mg via ORAL

## 2014-01-21 MED ORDER — SODIUM CHLORIDE 0.9 % IV SOLN
Freq: Once | INTRAVENOUS | Status: AC
  Administered 2014-01-21: 14:00:00 via INTRAVENOUS

## 2014-01-21 MED ORDER — TRASTUZUMAB CHEMO INJECTION 440 MG
2.0000 mg/kg | Freq: Once | INTRAVENOUS | Status: AC
  Administered 2014-01-21: 147 mg via INTRAVENOUS
  Filled 2014-01-21: qty 7

## 2014-01-21 NOTE — Patient Instructions (Signed)

## 2014-01-21 NOTE — Patient Instructions (Signed)

## 2014-01-21 NOTE — Progress Notes (Signed)
Hematology and Oncology Follow Up Visit  Kim [name] [id number] 1935-09-02 79 y.o. 01/21/2014 3:06 PM     Principle Diagnosis:Kim [name] 79 y.o. 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     Prior Therapy:  1. Patient presented for a screening mammogram 6 months ago that revealed a small area of calcifications in the upper-outer quadrant of the left breast. She was recommended a short term followup mammogram which was performed in January 2015. The mammogram revealed slight increase in the number of calcifications. Because of this she had a stereotactic biopsy performed on 10/18/2013. The pathology revealed a ductal carcinoma in situ. The tumor was ER positive PR positive. Measuring 1cm. Patient had MRI of the breasts performed which did reveal abnormal left axillary lymph nodes. Patient was seen by Dr. Excell Seltzer who recommended partial left mastectomy with sentinel lymph node biopsy/dissection. Patient went on to have lumpectomy with lymph node dissection performed on 11/05/2013. The final pathology revealed 1.5 cm ductal carcinoma in situ with foci of intravascular carcinoma, 4 of 10 lymph nodes were positive for metastatic disease. The tumor was ER positive PR positive. HER-2/neu and Ki-67 were not performed.   2. Patient had HER-2/neu positive results from pathology on 11/06/13. Patient underwent PET/CT on 12/12/13 that was negative for metastatic disease.   3.  Patient received one cycle of Taxotere, Carboplatin, Herceptin on 12/17/13, this was followed by unstable angina on 12/19/13 and she was admitted and underwent a cardiac catheterization.     Current therapy:  Herceptin  Interim History: Kim [name] 79 y.o. female is here today for evaluation prior to Taxotere, Carboplatin, Herceptin cycle 2 day 1.  After her last treatment the patient suffered from a heart attack, her daughter communicates that the patient was in the  bed x 2 weeks, had no appetite, was dehydrated, and does not think that she can tolerate the strong chemotherapy.  She also had significant bone pain from the Neulasta.  They would like to talk to Dr. Humphrey Rolls about possibly receiving a less intense chemotherapy.  She denies fevers, chills, nausea, vomiting, constipation, diarrhea, chest pain, palpitations, swelling, DOE, orthopnea, PND or any further concerns.    Medications:  Current Outpatient Prescriptions  Medication Sig Dispense Refill  . aspirin EC 81 MG EC tablet Take 1 tablet (81 mg total) by mouth daily.      Marland Kitchen atorvastatin (LIPITOR) 10 MG tablet Take 1 tablet (10 mg total) by mouth daily at 6 PM.  30 tablet  6  . benazepril (LOTENSIN) 10 MG tablet Take 0.5 tablets (5 mg total) by mouth daily.  15 tablet  1  . Calcium Citrate-Vitamin D (CITRACAL MAXIMUM PO) Take 1 tablet by mouth daily.       . cetirizine (ZYRTEC) 10 MG tablet Take 10 mg by mouth daily as needed for allergies.      Marland Kitchen clopidogrel (PLAVIX) 75 MG tablet Take 1 tablet (75 mg total) by mouth daily with breakfast.  30 tablet  6  . dexamethasone (DECADRON) 4 MG tablet Take 8 mg by mouth 2 (two) times daily with a meal.       . levothyroxine (SYNTHROID, LEVOTHROID) 125 MCG tablet Take 125 mcg by mouth daily before breakfast.      . lidocaine-prilocaine (EMLA) cream Apply topically as needed.  30 g  6  . metoprolol succinate (TOPROL XL) 50 MG 24 hr tablet Take 1 tablet (50 mg total) by mouth daily.  30 tablet  6  . Multiple Vitamin (MULTIVITAMIN) tablet Take 1 tablet by mouth daily.        . pantoprazole (PROTONIX) 40 MG tablet Take 1 tablet (40 mg total) by mouth daily at 6 (six) AM.  30 tablet  6  . pegfilgrastim (NEULASTA) 6 MG/0.6ML injection Inject 6 mg into the skin once.      Marland Kitchen acetaminophen (TYLENOL) 650 MG CR tablet Take 650 mg by mouth every 8 (eight) hours as needed.      Marland Kitchen HYDROcodone-acetaminophen (NORCO) 5-325 MG per tablet Take 1-2 tablets by mouth every 6 (six) hours  as needed.  30 tablet  0  . nitroGLYCERIN (NITROSTAT) 0.4 MG SL tablet Place 1 tablet (0.4 mg total) under the tongue every 5 (five) minutes x 3 doses as needed for chest pain.  25 tablet  3  . ondansetron (ZOFRAN) 8 MG tablet Take 8 mg by mouth every 8 (eight) hours as needed for nausea or vomiting.      Marland Kitchen oxyCODONE-acetaminophen (PERCOCET/ROXICET) 5-325 MG per tablet Take 1 tablet by mouth every 6 (six) hours as needed for severe pain.  30 tablet    . prochlorperazine (COMPAZINE) 10 MG tablet Take 10 mg by mouth every 6 (six) hours as needed for nausea or vomiting.      . [DISCONTINUED] ferrous sulfate 325 (65 FE) MG tablet Take 325 mg by mouth 2 (two) times daily.         No current facility-administered medications for this visit.   Facility-Administered Medications Ordered in Other Visits  Medication Dose Route Frequency Provider Last Rate Last Dose  . sodium chloride 0.9 % injection 10 mL  10 mL Intracatheter PRN Deatra Robinson, MD   10 mL at 01/21/14 1441     Allergies:  Allergies  Allergen Reactions  . Propoxyphene N-Acetaminophen Swelling    tongue swelling  . Penicillins Swelling    Facial swelling  . Cortisone Rash  . Pseudoephedrine Hcl Er Rash and Other (See Comments)    Face gets red    Medical History: Past Medical History  Diagnosis Date  . Allergy   . Allergic rhinitis   . Hypothyroidism   . Urinary tract infection   . Diverticulosis   . Hx of adenomatous colonic polyps 02/1999  . Tubulovillous adenoma of colon 12/2009    with HGD  . Low back pain     gets ESI from Dr. Rennis Harding  . Arthritis   . Hypertension   . HOH (hard of hearing)   . Wears hearing aid   . Breast cancer of upper-outer quadrant of left female breast 11/08/13  . CAD (coronary artery disease)     a. 12/2013 Cath/PCI: LM nl, LAD 20p, 54m, 30d, D1 30p, LCX 20p, 6m, OM1 20, Mo2 40p, RCA 30p, 20m (4.0x16 Promus Premier DES), 20d, RPL 30, RPDA 70p, 12m, EF 65-70%.  . Hyperlipidemia      Surgical History:  Past Surgical History  Procedure Laterality Date  . Abdominal hysterectomy    . Bilateral salpingoophorectomy      see Laurin Coder NP for GYN exams  . Cataract extraction w/ intraocular lens  implant, bilateral  2012    bilateral  . Colonoscopy    . Breast lumpectomy with needle localization and axillary lymph node dissection Left 11/05/2013    Procedure: LEFT BREAST LUMPECTOMY WITH NEEDLE LOCALIZATION and axillary lymph Node Dissection;  Surgeon: Edward Jolly, MD;  Location: [place];  Service: General;  Laterality: Left;  . Breast surgery    . Portacath placement Right 12/11/2013    Procedure: INSERTION PORT-A-CATH;  Surgeon: Edward Jolly, MD;  Location: [place];  Service: General;  Laterality: Right;  Subclavian Vein; REMOVAL OF JP DRAIN LEFT AXILLA     Review of Systems: A 10 point review of systems was conducted and is otherwise negative except for what is noted above.    Physical Exam: Blood pressure 142/76, pulse 65, temperature 97.9 F (36.6 C), temperature source Oral, resp. rate 18, height $RemoveBe'5\' 8"'CJJnnIAwv$  (1.727 m), weight 151 lb 12.8 oz (68.856 kg). GENERAL: Patient is a well appearing older female in no acute distress HEENT:  Sclerae anicteric.  Oropharynx clear and moist. No ulcerations or evidence of oropharyngeal candidiasis. Neck is supple.  NODES:  No cervical, supraclavicular, or axillary lymphadenopathy palpated.  BREAST EXAM:  Deferred. LUNGS:  Clear to auscultation bilaterally.  No wheezes or rhonchi. HEART:  Regular rate and rhythm. No murmur appreciated. ABDOMEN:  Soft, nontender.  Positive, normoactive bowel sounds. No organomegaly palpated. MSK:  No focal spinal tenderness to palpation. Full range of motion bilaterally in the upper extremities. EXTREMITIES:  No peripheral edema.   SKIN:  Clear with no obvious rashes or skin changes. No nail dyscrasia. NEURO:  Nonfocal. Well oriented.  Appropriate  affect. ECOG PERFORMANCE STATUS: 1 - Symptomatic but completely ambulatory   Lab Results: Lab Results  Component Value Date   WBC 9.1 01/21/2014   HGB 10.8* 01/21/2014   HCT 32.5* 01/21/2014   MCV 93.7 01/21/2014   PLT 153 01/21/2014     Chemistry      Component Value Date/Time   NA 136* 12/20/2013 0154   NA 139 12/17/2013 1028   K 4.5 12/20/2013 0154   K 3.9 12/17/2013 1028   CL 100 12/20/2013 0154   CO2 23 12/20/2013 0154   CO2 22 12/17/2013 1028   BUN 31* 12/20/2013 0154   BUN 20.9 12/17/2013 1028   CREATININE 0.84 12/20/2013 0154   CREATININE 0.9 12/17/2013 1028      Component Value Date/Time   CALCIUM 8.6 12/20/2013 0154   CALCIUM 10.0 12/17/2013 1028   ALKPHOS 59 12/19/2013 1140   ALKPHOS 67 12/17/2013 1028   AST 17 12/19/2013 1140   AST 10 12/17/2013 1028   ALT 36* 12/19/2013 1140   ALT 10 12/17/2013 1028   BILITOT 1.0 12/19/2013 1140   BILITOT 0.78 12/17/2013 1028      Assessment and Plan: Kim [name] 79 y.o. female with  1. Stage III triple positive breast cancer of the left breast.  Please see prior history above.  Patient did receive one cycle of adjuvant Taxotere, Carboplatin and Herceptin on 12/17/13 and subsequently suffered from unstable angina on 12/19/13 requiring a cardiac catheterization and stent placement.  The patient had an incredibly difficult time with weakness, nausea, vomiting, pain following chemotherapy, and does not wish to receive such intense treatment.  They discussed this with Dr. Humphrey Rolls.  The patient will continue to receive Herceptin weekly, and will return in three weeks for Abraxane and Herceptin.    2. CAD s/p RCA PCI with drug eluting stent placement.  The patient was recently evaluated in the cardio-onc clinic by Dr. Aundra Dubin.  She underwent an echocardiogram on 01/10/14 and her LVEF was 65-70%.  I spoke with Dr. Haroldine Laws who reviewed her echo and cleared her to proceed with Hercptin therapy.  She was recommended 1 month follow up.  The patient will  return in one week for labs, evaluation, and Herceptin therapy.   She knows to call us in the interim for any questions or concerns.  We can certainly see her sooner if needed.  I spent 25 minutes counseling the patient face to face.  The total time spent in the appointment was 30 minutes.  Minette Headland, [place] [phone] 01/21/2014 3:06 PM

## 2014-01-22 ENCOUNTER — Ambulatory Visit: Payer: Medicare Other

## 2014-01-23 ENCOUNTER — Telehealth: Payer: Self-pay | Admitting: Oncology

## 2014-01-23 ENCOUNTER — Telehealth: Payer: Self-pay | Admitting: *Deleted

## 2014-01-23 NOTE — Telephone Encounter (Signed)
s.w. pt and advised on 4.27.15 time change...pt ok adn aware

## 2014-01-23 NOTE — Telephone Encounter (Signed)
Called pt to inform her of potassium level (3.3L). Told pt I have called in [place] to her pharmacy per Charlestine Massed, NP. Disp - 5, Refill -0 . Instructed pt to take 1- 20 mEq tablet daily x 5 days. Pt verbalized understanding. No further concerns. Message to be forwarded to [place].

## 2014-01-28 ENCOUNTER — Ambulatory Visit (HOSPITAL_BASED_OUTPATIENT_CLINIC_OR_DEPARTMENT_OTHER): Payer: Medicare Other

## 2014-01-28 ENCOUNTER — Ambulatory Visit: Payer: Medicare Other | Admitting: Adult Health

## 2014-01-28 ENCOUNTER — Encounter: Payer: Self-pay | Admitting: Adult Health

## 2014-01-28 ENCOUNTER — Other Ambulatory Visit (HOSPITAL_BASED_OUTPATIENT_CLINIC_OR_DEPARTMENT_OTHER): Payer: Medicare Other

## 2014-01-28 ENCOUNTER — Ambulatory Visit (HOSPITAL_BASED_OUTPATIENT_CLINIC_OR_DEPARTMENT_OTHER): Payer: Medicare Other | Admitting: Adult Health

## 2014-01-28 ENCOUNTER — Other Ambulatory Visit: Payer: Medicare Other

## 2014-01-28 VITALS — BP 123/71 | HR 93 | Temp 98.3°F | Resp 20 | Ht 68.0 in | Wt 172.1 lb

## 2014-01-28 DIAGNOSIS — C50412 Malignant neoplasm of upper-outer quadrant of left female breast: Secondary | ICD-10-CM

## 2014-01-28 DIAGNOSIS — C50419 Malignant neoplasm of upper-outer quadrant of unspecified female breast: Secondary | ICD-10-CM

## 2014-01-28 DIAGNOSIS — C773 Secondary and unspecified malignant neoplasm of axilla and upper limb lymph nodes: Secondary | ICD-10-CM

## 2014-01-28 DIAGNOSIS — Z5112 Encounter for antineoplastic immunotherapy: Secondary | ICD-10-CM

## 2014-01-28 LAB — COMPREHENSIVE METABOLIC PANEL (CC13)
ALT: 13 U/L (ref 0–55)
AST: 10 U/L (ref 5–34)
Albumin: 3.7 g/dL (ref 3.5–5.0)
Alkaline Phosphatase: 63 U/L (ref 40–150)
Anion Gap: 10 mEq/L (ref 3–11)
BUN: 35.3 mg/dL — ABNORMAL HIGH (ref 7.0–26.0)
CO2: 21 mEq/L — ABNORMAL LOW (ref 22–29)
Calcium: 9.4 mg/dL (ref 8.4–10.4)
Chloride: 106 mEq/L (ref 98–109)
Creatinine: 1.3 mg/dL — ABNORMAL HIGH (ref 0.6–1.1)
Glucose: 156 mg/dl — ABNORMAL HIGH (ref 70–140)
Potassium: 4.3 mEq/L (ref 3.5–5.1)
Sodium: 137 mEq/L (ref 136–145)
Total Bilirubin: 0.76 mg/dL (ref 0.20–1.20)
Total Protein: 6.7 g/dL (ref 6.4–8.3)

## 2014-01-28 LAB — CBC WITH DIFFERENTIAL/PLATELET
BASO%: 0 % (ref 0.0–2.0)
Basophils Absolute: 0 10*3/uL (ref 0.0–0.1)
EOS%: 0 % (ref 0.0–7.0)
Eosinophils Absolute: 0 10*3/uL (ref 0.0–0.5)
HCT: 34.1 % — ABNORMAL LOW (ref 34.8–46.6)
HGB: 11.4 g/dL — ABNORMAL LOW (ref 11.6–15.9)
LYMPH%: 5.4 % — ABNORMAL LOW (ref 14.0–49.7)
MCH: 31.3 pg (ref 25.1–34.0)
MCHC: 33.4 g/dL (ref 31.5–36.0)
MCV: 93.7 fL (ref 79.5–101.0)
MONO#: 0.5 10*3/uL (ref 0.1–0.9)
MONO%: 5.4 % (ref 0.0–14.0)
NEUT#: 7.4 10*3/uL — ABNORMAL HIGH (ref 1.5–6.5)
NEUT%: 89.2 % — ABNORMAL HIGH (ref 38.4–76.8)
Platelets: 198 10*3/uL (ref 145–400)
RBC: 3.64 10*6/uL — ABNORMAL LOW (ref 3.70–5.45)
RDW: 17.3 % — ABNORMAL HIGH (ref 11.2–14.5)
WBC: 8.3 10*3/uL (ref 3.9–10.3)
lymph#: 0.5 10*3/uL — ABNORMAL LOW (ref 0.9–3.3)

## 2014-01-28 MED ORDER — DIPHENHYDRAMINE HCL 25 MG PO CAPS
ORAL_CAPSULE | ORAL | Status: AC
  Filled 2014-01-28: qty 2

## 2014-01-28 MED ORDER — HEPARIN SOD (PORK) LOCK FLUSH 100 UNIT/ML IV SOLN
500.0000 [IU] | Freq: Once | INTRAVENOUS | Status: AC | PRN
  Administered 2014-01-28: 500 [IU]
  Filled 2014-01-28: qty 5

## 2014-01-28 MED ORDER — TRASTUZUMAB CHEMO INJECTION 440 MG
2.0000 mg/kg | Freq: Once | INTRAVENOUS | Status: AC
  Administered 2014-01-28: 147 mg via INTRAVENOUS
  Filled 2014-01-28: qty 7

## 2014-01-28 MED ORDER — SODIUM CHLORIDE 0.9 % IJ SOLN
10.0000 mL | INTRAMUSCULAR | Status: DC | PRN
  Administered 2014-01-28: 10 mL
  Filled 2014-01-28: qty 10

## 2014-01-28 MED ORDER — ACETAMINOPHEN 325 MG PO TABS
ORAL_TABLET | ORAL | Status: AC
  Filled 2014-01-28: qty 2

## 2014-01-28 MED ORDER — ACETAMINOPHEN 325 MG PO TABS
650.0000 mg | ORAL_TABLET | Freq: Once | ORAL | Status: AC
  Administered 2014-01-28: 650 mg via ORAL

## 2014-01-28 MED ORDER — DIPHENHYDRAMINE HCL 25 MG PO CAPS
50.0000 mg | ORAL_CAPSULE | Freq: Once | ORAL | Status: AC
  Administered 2014-01-28: 50 mg via ORAL

## 2014-01-28 MED ORDER — SODIUM CHLORIDE 0.9 % IV SOLN
Freq: Once | INTRAVENOUS | Status: AC
  Administered 2014-01-28: 16:00:00 via INTRAVENOUS

## 2014-01-28 NOTE — Progress Notes (Signed)
Hematology and Oncology Follow Up Visit  ALINAH SHEARD [id number] 12/03/1934 79 y.o. 01/30/2014 4:12 PM     Principle Diagnosis:Kalena B Lambert Mody 79 y.o. 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     Prior Therapy:  1. Patient presented for a screening mammogram 6 months ago that revealed a small area of calcifications in the upper-outer quadrant of the left breast. She was recommended a short term followup mammogram which was performed in January 2015. The mammogram revealed slight increase in the number of calcifications. Because of this she had a stereotactic biopsy performed on 10/18/2013. The pathology revealed a ductal carcinoma in situ. The tumor was ER positive PR positive. Measuring 1cm. Patient had MRI of the breasts performed which did reveal abnormal left axillary lymph nodes. Patient was seen by Dr. Excell Seltzer who recommended partial left mastectomy with sentinel lymph node biopsy/dissection. Patient went on to have lumpectomy with lymph node dissection performed on 11/05/2013. The final pathology revealed 1.5 cm ductal carcinoma in situ with foci of intravascular carcinoma, 4 of 10 lymph nodes were positive for metastatic disease. The tumor was ER positive PR positive. HER-2/neu and Ki-67 were not performed.   2. Patient had HER-2/neu positive results from pathology on 11/06/13. Patient underwent PET/CT on 12/12/13 that was negative for metastatic disease.   3.  Patient received one cycle of Taxotere, Carboplatin, Herceptin on 12/17/13, this was followed by unstable angina on 12/19/13 and she was admitted and underwent a cardiac catheterization.  The patient only received one cycle of TCH, and weekly herceptin for three weeks.    4.  Patient to start Abraxane on day 1 and day 8 of a21 day cycle and Herceptin on day 1 of a 21 day cycle beginning on 02/04/14.     Current therapy:  Herceptin  Interim History: JORDY HEWINS 79 y.o.  female is here today for evaluation of her stage III triple positive breast cancer.  She is doing moderately well.  She would like more informationa bout the specifics regarding Herceptin and Abraxane.  She wants to know what her future schedule will look like.  She also wants more information about Abraxane and the Herceptin.  Otherwise, she is feeling well.  She denies fevers, chills, nausea, vomiting, constipation, diarrhea, numbness, shortness of breath, DOE or any other concerns.  A 10 point ROS is neg.   Medications:  Current Outpatient Prescriptions  Medication Sig Dispense Refill  . acetaminophen (TYLENOL) 650 MG CR tablet Take 650 mg by mouth every 8 (eight) hours as needed.      Marland Kitchen aspirin EC 81 MG EC tablet Take 1 tablet (81 mg total) by mouth daily.      Marland Kitchen atorvastatin (LIPITOR) 10 MG tablet Take 1 tablet (10 mg total) by mouth daily at 6 PM.  30 tablet  6  . benazepril (LOTENSIN) 10 MG tablet Take 0.5 tablets (5 mg total) by mouth daily.  15 tablet  1  . Calcium Citrate-Vitamin D (CITRACAL MAXIMUM PO) Take 1 tablet by mouth daily.       . cetirizine (ZYRTEC) 10 MG tablet Take 10 mg by mouth daily as needed for allergies.      Marland Kitchen clopidogrel (PLAVIX) 75 MG tablet Take 1 tablet (75 mg total) by mouth daily with breakfast.  30 tablet  6  . levothyroxine (SYNTHROID, LEVOTHROID) 125 MCG tablet Take 125 mcg by mouth daily before breakfast.      . lidocaine-prilocaine (EMLA)  cream Apply topically as needed.  30 g  6  . metoprolol succinate (TOPROL XL) 50 MG 24 hr tablet Take 1 tablet (50 mg total) by mouth daily.  30 tablet  6  . Multiple Vitamin (MULTIVITAMIN) tablet Take 1 tablet by mouth daily.        . pantoprazole (PROTONIX) 40 MG tablet Take 1 tablet (40 mg total) by mouth daily at 6 (six) AM.  30 tablet  6  . pegfilgrastim (NEULASTA) 6 MG/0.6ML injection Inject 6 mg into the skin once.      Marland Kitchen dexamethasone (DECADRON) 4 MG tablet Take 8 mg by mouth 2 (two) times daily with a meal.        . HYDROcodone-acetaminophen (NORCO) 5-325 MG per tablet Take 1-2 tablets by mouth every 6 (six) hours as needed.  30 tablet  0  . nitroGLYCERIN (NITROSTAT) 0.4 MG SL tablet Place 1 tablet (0.4 mg total) under the tongue every 5 (five) minutes x 3 doses as needed for chest pain.  25 tablet  3  . ondansetron (ZOFRAN) 8 MG tablet Take 8 mg by mouth every 8 (eight) hours as needed for nausea or vomiting.      Marland Kitchen oxyCODONE-acetaminophen (PERCOCET/ROXICET) 5-325 MG per tablet Take 1 tablet by mouth every 6 (six) hours as needed for severe pain.  30 tablet    . prochlorperazine (COMPAZINE) 10 MG tablet Take 10 mg by mouth every 6 (six) hours as needed for nausea or vomiting.      . [DISCONTINUED] ferrous sulfate 325 (65 FE) MG tablet Take 325 mg by mouth 2 (two) times daily.         No current facility-administered medications for this visit.     Allergies:  Allergies  Allergen Reactions  . Propoxyphene N-Acetaminophen Swelling    tongue swelling  . Penicillins Swelling    Facial swelling  . Cortisone Rash  . Pseudoephedrine Hcl Er Rash and Other (See Comments)    Face gets red    Medical History: Past Medical History  Diagnosis Date  . Allergy   . Allergic rhinitis   . Hypothyroidism   . Urinary tract infection   . Diverticulosis   . Hx of adenomatous colonic polyps 02/1999  . Tubulovillous adenoma of colon 12/2009    with HGD  . Low back pain     gets ESI from Dr. Rennis Harding  . Arthritis   . Hypertension   . HOH (hard of hearing)   . Wears hearing aid   . Breast cancer of upper-outer quadrant of left female breast 11/08/13  . CAD (coronary artery disease)     a. 12/2013 Cath/PCI: LM nl, LAD 20p, 40m, 30d, D1 30p, LCX 20p, 76m, OM1 20, Mo2 40p, RCA 30p, 5m (4.0x16 Promus Premier DES), 20d, RPL 30, RPDA 70p, 40m, EF 65-70%.  . Hyperlipidemia     Surgical History:  Past Surgical History  Procedure Laterality Date  . Abdominal hysterectomy    . Bilateral salpingoophorectomy       see Laurin Coder NP for GYN exams  . Cataract extraction w/ intraocular lens  implant, bilateral  2012    bilateral  . Colonoscopy    . Breast lumpectomy with needle localization and axillary lymph node dissection Left 11/05/2013    Procedure: LEFT BREAST LUMPECTOMY WITH NEEDLE LOCALIZATION and axillary lymph Node Dissection;  Surgeon: Edward Jolly, MD;  Location: [place];  Service: General;  Laterality: Left;  . Breast surgery    .  Portacath placement Right 12/11/2013    Procedure: INSERTION PORT-A-CATH;  Surgeon: Edward Jolly, MD;  Location: [place];  Service: General;  Laterality: Right;  Subclavian Vein; REMOVAL OF JP DRAIN LEFT AXILLA     Review of Systems: A 10 point review of systems was conducted and is otherwise negative except for what is noted above.    Physical Exam: Blood pressure 123/71, pulse 93, temperature 98.3 F (36.8 C), temperature source Oral, resp. rate 20, height $RemoveBe'5\' 8"'wJPWdHfyJ$  (1.727 m), weight 172 lb 1.6 oz (78.064 kg). GENERAL: Patient is a well appearing older female in no acute distress HEENT:  Sclerae anicteric.  Oropharynx clear and moist. No ulcerations or evidence of oropharyngeal candidiasis. Neck is supple.  NODES:  No cervical, supraclavicular, or axillary lymphadenopathy palpated.  BREAST EXAM:  Deferred. LUNGS:  Clear to auscultation bilaterally.  No wheezes or rhonchi. HEART:  Regular rate and rhythm. No murmur appreciated. ABDOMEN:  Soft, nontender.  Positive, normoactive bowel sounds. No organomegaly palpated. MSK:  No focal spinal tenderness to palpation. Full range of motion bilaterally in the upper extremities. EXTREMITIES:  No peripheral edema.   SKIN:  Clear with no obvious rashes or skin changes. No nail dyscrasia. NEURO:  Nonfocal. Well oriented.  Appropriate affect. ECOG PERFORMANCE STATUS: 1 - Symptomatic but completely ambulatory   Lab Results: Lab Results  Component Value Date   WBC 8.3  01/28/2014   HGB 11.4* 01/28/2014   HCT 34.1* 01/28/2014   MCV 93.7 01/28/2014   PLT 198 01/28/2014     Chemistry      Component Value Date/Time   NA 137 01/28/2014 1354   NA 136* 12/20/2013 0154   K 4.3 01/28/2014 1354   K 4.5 12/20/2013 0154   CL 100 12/20/2013 0154   CO2 21* 01/28/2014 1354   CO2 23 12/20/2013 0154   BUN 35.3* 01/28/2014 1354   BUN 31* 12/20/2013 0154   CREATININE 1.3* 01/28/2014 1354   CREATININE 0.84 12/20/2013 0154      Component Value Date/Time   CALCIUM 9.4 01/28/2014 1354   CALCIUM 8.6 12/20/2013 0154   ALKPHOS 63 01/28/2014 1354   ALKPHOS 59 12/19/2013 1140   AST 10 01/28/2014 1354   AST 17 12/19/2013 1140   ALT 13 01/28/2014 1354   ALT 36* 12/19/2013 1140   BILITOT 0.76 01/28/2014 1354   BILITOT 1.0 12/19/2013 1140      Assessment and Plan: Toma Deiters 79 y.o. female with  1. Stage III triple positive breast cancer of the left breast.  Please see prior history above. Patient couldn't tolerate the Taxotere, Carboplatin, and Herceptin, therefore it was discontinued.  We discussed weekly abraxane in detail.  She will receive it on day 1 and day 8 on a 21 day cycle, and receive Herceptin on day 1 of a 21 day cycle.  The patient will begin this on 02/04/14.  I gave her detailed information about the Abraxane in her AVS.    2. CAD s/p RCA PCI with drug eluting stent placement.  The patient was recently evaluated in the cardio-onc clinic by Dr. Aundra Dubin.  She underwent an echocardiogram on 01/10/14 and her LVEF was 65-70%.  I spoke with Dr. Haroldine Laws who reviewed her echo and cleared her to proceed with Hercptin therapy.  She was recommended 1 month follow up.  This is scheduled on 02/07/14.    This plan was reviewed with Dr. Jana Hakim.    The patient will return on 02/04/14 for labs,  evaluation, and chemotherapy. She knows to call us in the interim for any questions or concerns.  We can certainly see her sooner if needed.  I spent 25 minutes counseling the patient face to face.  The  total time spent in the appointment was 30 minutes.  Minette Headland, [place] [phone] 01/30/2014 4:12 PM

## 2014-01-29 ENCOUNTER — Telehealth: Payer: Self-pay | Admitting: Cardiology

## 2014-01-29 ENCOUNTER — Encounter: Payer: Self-pay | Admitting: Cardiology

## 2014-01-29 NOTE — Telephone Encounter (Signed)
New message   Call patient with appt , date, time. For cardiac mri 5/13    Patient stated she did not need this appt.  Was told her echo was normal.

## 2014-01-30 ENCOUNTER — Other Ambulatory Visit (HOSPITAL_COMMUNITY): Payer: Self-pay | Admitting: Cardiology

## 2014-01-30 ENCOUNTER — Telehealth: Payer: Self-pay | Admitting: Hematology & Oncology

## 2014-01-30 DIAGNOSIS — I421 Obstructive hypertrophic cardiomyopathy: Secondary | ICD-10-CM

## 2014-01-30 NOTE — Telephone Encounter (Signed)
Daughter called wants to switch care to Dr. Marin Olp. Dr. Marin Olp has chart to review. I left message that we will call after MD reviews.

## 2014-01-31 ENCOUNTER — Telehealth: Payer: Self-pay | Admitting: Hematology & Oncology

## 2014-01-31 ENCOUNTER — Other Ambulatory Visit: Payer: Self-pay | Admitting: Hematology & Oncology

## 2014-01-31 ENCOUNTER — Other Ambulatory Visit: Payer: Self-pay | Admitting: Oncology

## 2014-01-31 NOTE — Telephone Encounter (Signed)
Pt aware of 5-4, RN aware of tx

## 2014-02-04 ENCOUNTER — Other Ambulatory Visit (HOSPITAL_BASED_OUTPATIENT_CLINIC_OR_DEPARTMENT_OTHER): Payer: Medicare Other | Admitting: Lab

## 2014-02-04 ENCOUNTER — Ambulatory Visit: Payer: Medicare Other

## 2014-02-04 ENCOUNTER — Ambulatory Visit: Payer: Medicare Other | Admitting: Adult Health

## 2014-02-04 ENCOUNTER — Encounter: Payer: Self-pay | Admitting: Hematology & Oncology

## 2014-02-04 ENCOUNTER — Other Ambulatory Visit: Payer: Medicare Other

## 2014-02-04 ENCOUNTER — Ambulatory Visit (HOSPITAL_BASED_OUTPATIENT_CLINIC_OR_DEPARTMENT_OTHER): Payer: Medicare Other | Admitting: Hematology & Oncology

## 2014-02-04 ENCOUNTER — Ambulatory Visit (HOSPITAL_BASED_OUTPATIENT_CLINIC_OR_DEPARTMENT_OTHER): Payer: Medicare Other

## 2014-02-04 VITALS — BP 121/59 | HR 79 | Temp 97.8°F | Resp 14 | Ht 68.0 in | Wt 171.0 lb

## 2014-02-04 DIAGNOSIS — C773 Secondary and unspecified malignant neoplasm of axilla and upper limb lymph nodes: Secondary | ICD-10-CM

## 2014-02-04 DIAGNOSIS — C50412 Malignant neoplasm of upper-outer quadrant of left female breast: Secondary | ICD-10-CM

## 2014-02-04 DIAGNOSIS — Z170 Estrogen receptor positive status [ER+]: Secondary | ICD-10-CM

## 2014-02-04 DIAGNOSIS — C50419 Malignant neoplasm of upper-outer quadrant of unspecified female breast: Secondary | ICD-10-CM

## 2014-02-04 DIAGNOSIS — C50919 Malignant neoplasm of unspecified site of unspecified female breast: Secondary | ICD-10-CM

## 2014-02-04 DIAGNOSIS — Z5112 Encounter for antineoplastic immunotherapy: Secondary | ICD-10-CM

## 2014-02-04 DIAGNOSIS — Z5111 Encounter for antineoplastic chemotherapy: Secondary | ICD-10-CM

## 2014-02-04 LAB — CBC WITH DIFFERENTIAL (CANCER CENTER ONLY)
BASO#: 0 10*3/uL (ref 0.0–0.2)
BASO%: 0.2 % (ref 0.0–2.0)
EOS%: 0.9 % (ref 0.0–7.0)
Eosinophils Absolute: 0.1 10*3/uL (ref 0.0–0.5)
HCT: 34.7 % — ABNORMAL LOW (ref 34.8–46.6)
HGB: 11.5 g/dL — ABNORMAL LOW (ref 11.6–15.9)
LYMPH#: 1.2 10*3/uL (ref 0.9–3.3)
LYMPH%: 14.1 % (ref 14.0–48.0)
MCH: 32.2 pg (ref 26.0–34.0)
MCHC: 33.1 g/dL (ref 32.0–36.0)
MCV: 97 fL (ref 81–101)
MONO#: 1 10*3/uL — ABNORMAL HIGH (ref 0.1–0.9)
MONO%: 11 % (ref 0.0–13.0)
NEUT#: 6.4 10*3/uL (ref 1.5–6.5)
NEUT%: 73.8 % (ref 39.6–80.0)
Platelets: 166 10*3/uL (ref 145–400)
RBC: 3.57 10*6/uL — ABNORMAL LOW (ref 3.70–5.32)
RDW: 16.4 % — ABNORMAL HIGH (ref 11.1–15.7)
WBC: 8.7 10*3/uL (ref 3.9–10.0)

## 2014-02-04 LAB — CMP (CANCER CENTER ONLY)
ALT(SGPT): 19 U/L (ref 10–47)
AST: 22 U/L (ref 11–38)
Albumin: 3.3 g/dL (ref 3.3–5.5)
Alkaline Phosphatase: 50 U/L (ref 26–84)
BUN, Bld: 25 mg/dL — ABNORMAL HIGH (ref 7–22)
CO2: 30 mEq/L (ref 18–33)
Calcium: 9.1 mg/dL (ref 8.0–10.3)
Chloride: 104 mEq/L (ref 98–108)
Creat: 1.1 mg/dl (ref 0.6–1.2)
Glucose, Bld: 97 mg/dL (ref 73–118)
Potassium: 3.9 mEq/L (ref 3.3–4.7)
Sodium: 139 mEq/L (ref 128–145)
Total Bilirubin: 0.7 mg/dl (ref 0.20–1.60)
Total Protein: 6.2 g/dL — ABNORMAL LOW (ref 6.4–8.1)

## 2014-02-04 MED ORDER — DIPHENHYDRAMINE HCL 25 MG PO CAPS
ORAL_CAPSULE | ORAL | Status: AC
  Filled 2014-02-04: qty 2

## 2014-02-04 MED ORDER — SODIUM CHLORIDE 0.9 % IJ SOLN
3.0000 mL | INTRAMUSCULAR | Status: DC | PRN
  Filled 2014-02-04: qty 10

## 2014-02-04 MED ORDER — HEPARIN SOD (PORK) LOCK FLUSH 100 UNIT/ML IV SOLN
250.0000 [IU] | Freq: Once | INTRAVENOUS | Status: DC | PRN
  Filled 2014-02-04: qty 5

## 2014-02-04 MED ORDER — SODIUM CHLORIDE 0.9 % IV SOLN
Freq: Once | INTRAVENOUS | Status: AC
  Administered 2014-02-04: 12:00:00 via INTRAVENOUS

## 2014-02-04 MED ORDER — DIPHENHYDRAMINE HCL 50 MG/ML IJ SOLN: 50.0000 mg | Freq: Once | INTRAMUSCULAR | Status: DC | PRN

## 2014-02-04 MED ORDER — METHYLPREDNISOLONE SODIUM SUCC 125 MG IJ SOLR: 125.0000 mg | Freq: Once | INTRAMUSCULAR | Status: DC | PRN

## 2014-02-04 MED ORDER — SODIUM CHLORIDE 0.9 % IJ SOLN
10.0000 mL | INTRAMUSCULAR | Status: DC | PRN
  Administered 2014-02-04: 10 mL
  Filled 2014-02-04: qty 10

## 2014-02-04 MED ORDER — PACLITAXEL PROTEIN-BOUND CHEMO INJECTION 100 MG
100.0000 mg/m2 | Freq: Once | INTRAVENOUS | Status: AC
  Administered 2014-02-04: 200 mg via INTRAVENOUS
  Filled 2014-02-04: qty 40

## 2014-02-04 MED ORDER — TRASTUZUMAB CHEMO INJECTION 440 MG
6.0000 mg/kg | Freq: Once | INTRAVENOUS | Status: AC
  Administered 2014-02-04: 462 mg via INTRAVENOUS
  Filled 2014-02-04: qty 22

## 2014-02-04 MED ORDER — ALTEPLASE 2 MG IJ SOLR
2.0000 mg | Freq: Once | INTRAMUSCULAR | Status: DC | PRN
  Filled 2014-02-04: qty 2

## 2014-02-04 MED ORDER — EPINEPHRINE HCL 1 MG/ML IJ SOLN: 0.5000 mg | Freq: Once | INTRAMUSCULAR | Status: DC | PRN

## 2014-02-04 MED ORDER — ONDANSETRON 8 MG/50ML IVPB (CHCC)
8.0000 mg | Freq: Once | INTRAVENOUS | Status: AC
  Administered 2014-02-04: 8 mg via INTRAVENOUS

## 2014-02-04 MED ORDER — ALBUTEROL SULFATE (2.5 MG/3ML) 0.083% IN NEBU
2.5000 mg | INHALATION_SOLUTION | Freq: Once | RESPIRATORY_TRACT | Status: DC | PRN
  Filled 2014-02-04: qty 3

## 2014-02-04 MED ORDER — ACETAMINOPHEN 325 MG PO TABS
ORAL_TABLET | ORAL | Status: AC
  Filled 2014-02-04: qty 2

## 2014-02-04 MED ORDER — SODIUM CHLORIDE 0.9 % IV SOLN: Freq: Once | INTRAVENOUS | Status: DC

## 2014-02-04 MED ORDER — HEPARIN SOD (PORK) LOCK FLUSH 100 UNIT/ML IV SOLN
500.0000 [IU] | Freq: Once | INTRAVENOUS | Status: DC | PRN
  Filled 2014-02-04: qty 5

## 2014-02-04 MED ORDER — DEXAMETHASONE SODIUM PHOSPHATE 10 MG/ML IJ SOLN
INTRAMUSCULAR | Status: AC
  Filled 2014-02-04: qty 1

## 2014-02-04 MED ORDER — EPINEPHRINE HCL 0.1 MG/ML IJ SOSY
0.2500 mg | PREFILLED_SYRINGE | Freq: Once | INTRAMUSCULAR | Status: DC | PRN
  Filled 2014-02-04: qty 10

## 2014-02-04 MED ORDER — FAMOTIDINE IN NACL 20-0.9 MG/50ML-% IV SOLN: 20.0000 mg | Freq: Once | INTRAVENOUS | Status: DC | PRN

## 2014-02-04 MED ORDER — SODIUM CHLORIDE 0.9 % IJ SOLN
10.0000 mL | INTRAMUSCULAR | Status: DC | PRN
  Filled 2014-02-04: qty 10

## 2014-02-04 MED ORDER — HEPARIN SOD (PORK) LOCK FLUSH 100 UNIT/ML IV SOLN
500.0000 [IU] | Freq: Once | INTRAVENOUS | Status: AC | PRN
  Administered 2014-02-04: 500 [IU]
  Filled 2014-02-04: qty 5

## 2014-02-04 MED ORDER — DIPHENHYDRAMINE HCL 50 MG/ML IJ SOLN: 25.0000 mg | Freq: Once | INTRAMUSCULAR | Status: DC | PRN

## 2014-02-04 MED ORDER — DIPHENHYDRAMINE HCL 25 MG PO CAPS
50.0000 mg | ORAL_CAPSULE | Freq: Once | ORAL | Status: AC
  Administered 2014-02-04: 50 mg via ORAL

## 2014-02-04 MED ORDER — DEXAMETHASONE SODIUM PHOSPHATE 10 MG/ML IJ SOLN
10.0000 mg | Freq: Once | INTRAMUSCULAR | Status: AC
  Administered 2014-02-04: 10 mg via INTRAVENOUS

## 2014-02-04 MED ORDER — ACETAMINOPHEN 325 MG PO TABS
650.0000 mg | ORAL_TABLET | Freq: Once | ORAL | Status: AC
  Administered 2014-02-04: 650 mg via ORAL

## 2014-02-04 MED ORDER — SODIUM CHLORIDE 0.9 % IV SOLN: Freq: Once | INTRAVENOUS | Status: DC | PRN

## 2014-02-04 NOTE — Patient Instructions (Addendum)
Nanoparticle Albumin-Bound Paclitaxel injection What is this medicine? NANOPARTICLE ALBUMIN-BOUND PACLITAXEL (Na no PAHR ti kuhl al BYOO muhn-bound PAK li TAX el) is a chemotherapy drug. It targets fast dividing cells, like cancer cells, and causes these cells to die. This medicine is used to treat advanced breast cancer and advanced lung cancer. This medicine may be used for other purposes; ask your health care provider or pharmacist if you have questions. COMMON BRAND NAME(S): Abraxane What should I tell my health care provider before I take this medicine? They need to know if you have any of these conditions: -kidney disease -liver disease -low blood counts, like low platelets, red blood cells, or white blood cells -recent or ongoing radiation therapy -an unusual or allergic reaction to paclitaxel, albumin, other chemotherapy, other medicines, foods, dyes, or preservatives -pregnant or trying to get pregnant -breast-feeding How should I use this medicine? This drug is given as an infusion into a vein. It is administered in a hospital or clinic by a specially trained health care professional. Talk to your pediatrician regarding the use of this medicine in children. Special care may be needed. Overdosage: If you think you have taken too much of this medicine contact a poison control center or emergency room at once. NOTE: This medicine is only for you. Do not share this medicine with others. What if I miss a dose? It is important not to miss your dose. Call your doctor or health care professional if you are unable to keep an appointment. What may interact with this medicine? -cyclosporine -diazepam -ketoconazole -medicines to increase blood counts like filgrastim, pegfilgrastim, sargramostim -other chemotherapy drugs like cisplatin, doxorubicin, epirubicin, etoposide, teniposide, vincristine -quinidine -testosterone -vaccines -verapamil Talk to your doctor or health care professional  before taking any of these medicines: -acetaminophen -aspirin -ibuprofen -ketoprofen -naproxen This list may not describe all possible interactions. Give your health care provider a list of all the medicines, herbs, non-prescription drugs, or dietary supplements you use. Also tell them if you smoke, drink alcohol, or use illegal drugs. Some items may interact with your medicine. What should I watch for while using this medicine? Your condition will be monitored carefully while you are receiving this medicine. You will need important blood work done while you are taking this medicine. This drug may make you feel generally unwell. This is not uncommon, as chemotherapy can affect healthy cells as well as cancer cells. Report any side effects. Continue your course of treatment even though you feel ill unless your doctor tells you to stop. In some cases, you may be given additional medicines to help with side effects. Follow all directions for their use. Call your doctor or health care professional for advice if you get a fever, chills or sore throat, or other symptoms of a cold or flu. Do not treat yourself. This drug decreases your body's ability to fight infections. Try to avoid being around people who are sick. This medicine may increase your risk to bruise or bleed. Call your doctor or health care professional if you notice any unusual bleeding. Be careful brushing and flossing your teeth or using a toothpick because you may get an infection or bleed more easily. If you have any dental work done, tell your dentist you are receiving this medicine. Avoid taking products that contain aspirin, acetaminophen, ibuprofen, naproxen, or ketoprofen unless instructed by your doctor. These medicines may hide a fever. Do not become pregnant while taking this medicine. Women should inform their doctor if they wish  to become pregnant or think they might be pregnant. There is a potential for serious side effects to  an unborn child. Talk to your health care professional or pharmacist for more information. Do not breast-feed an infant while taking this medicine. Men are advised not to father a child while receiving this medicine. What side effects may I notice from receiving this medicine? Side effects that you should report to your doctor or health care professional as soon as possible: -allergic reactions like skin rash, itching or hives, swelling of the face, lips, or tongue -low blood counts - This drug may decrease the number of white blood cells, red blood cells and platelets. You may be at increased risk for infections and bleeding. -signs of infection - fever or chills, cough, sore throat, pain or difficulty passing urine -signs of decreased platelets or bleeding - bruising, pinpoint red spots on the skin, black, tarry stools, nosebleeds -signs of decreased red blood cells - unusually weak or tired, fainting spells, lightheadedness -breathing problems -changes in vision -chest pain -high or low blood pressure -mouth sores -nausea and vomiting -pain, swelling, redness or irritation at the injection site -pain, tingling, numbness in the hands or feet -slow or irregular heartbeat -swelling of the ankle, feet, hands Side effects that usually do not require medical attention (report to your doctor or health care professional if they continue or are bothersome): -aches, pains -changes in the color of fingernails -diarrhea -hair loss -loss of appetite This list may not describe all possible side effects. Call your doctor for medical advice about side effects. You may report side effects to FDA at 1-800-FDA-1088. Where should I keep my medicine? This drug is given in a hospital or clinic and will not be stored at home. NOTE: This sheet is a summary. It may not cover all possible information. If you have questions about this medicine, talk to your doctor, pharmacist, or health care provider.  2014,  Elsevier/Gold Standard. (2012-11-13 16:48:50)   Trastuzumab injection for infusion What is this medicine? TRASTUZUMAB (tras TOO zoo mab) is a monoclonal antibody. It targets a protein called HER2. This protein is found in some stomach and breast cancers. This medicine can stop cancer cell growth. This medicine may be used with other cancer treatments. This medicine may be used for other purposes; ask your health care provider or pharmacist if you have questions. COMMON BRAND NAME(S): Herceptin What should I tell my health care provider before I take this medicine? They need to know if you have any of these conditions: -heart disease -heart failure -infection (especially a virus infection such as chickenpox, cold sores, or herpes) -lung or breathing disease, like asthma -recent or ongoing radiation therapy -an unusual or allergic reaction to trastuzumab, benzyl alcohol, or other medications, foods, dyes, or preservatives -pregnant or trying to get pregnant -breast-feeding How should I use this medicine? This drug is given as an infusion into a vein. It is administered in a hospital or clinic by a specially trained health care professional. Talk to your pediatrician regarding the use of this medicine in children. This medicine is not approved for use in children. Overdosage: If you think you have taken too much of this medicine contact a poison control center or emergency room at once. NOTE: This medicine is only for you. Do not share this medicine with others. What if I miss a dose? It is important not to miss a dose. Call your doctor or health care professional if you are unable to  keep an appointment. What may interact with this medicine? -cyclophosphamide -doxorubicin -warfarin This list may not describe all possible interactions. Give your health care provider a list of all the medicines, herbs, non-prescription drugs, or dietary supplements you use. Also tell them if you smoke, drink  alcohol, or use illegal drugs. Some items may interact with your medicine. What should I watch for while using this medicine? Visit your doctor for checks on your progress. Report any side effects. Continue your course of treatment even though you feel ill unless your doctor tells you to stop. Call your doctor or health care professional for advice if you get a fever, chills or sore throat, or other symptoms of a cold or flu. Do not treat yourself. Try to avoid being around people who are sick. You may experience fever, chills and shaking during your first infusion. These effects are usually mild and can be treated with other medicines. Report any side effects during the infusion to your health care professional. Fever and chills usually do not happen with later infusions. What side effects may I notice from receiving this medicine? Side effects that you should report to your doctor or other health care professional as soon as possible: -breathing difficulties -chest pain or palpitations -cough -dizziness or fainting -fever or chills, sore throat -skin rash, itching or hives -swelling of the legs or ankles -unusually weak or tired Side effects that usually do not require medical attention (report to your doctor or other health care professional if they continue or are bothersome): -loss of appetite -headache -muscle aches -nausea This list may not describe all possible side effects. Call your doctor for medical advice about side effects. You may report side effects to FDA at 1-800-FDA-1088. Where should I keep my medicine? This drug is given in a hospital or clinic and will not be stored at home. NOTE: This sheet is a summary. It may not cover all possible information. If you have questions about this medicine, talk to your doctor, pharmacist, or health care provider.  2014, Elsevier/Gold Standard. (2009-07-25 13:43:15)

## 2014-02-06 NOTE — Progress Notes (Signed)
Hematology and Oncology Follow Up Visit  Kim [name] [id number] August 02, 1935 79 y.o. 02/06/2014   Principle Diagnosis:   Stage IIA (T1N1M0) adenocarcinoma of the left breast-triple positive  Current Therapy:    Patient to start weekly Abraxane with Herceptin     Interim History:  [name] is in for her first office visit with [place]. She was [facility] seen [facility] but now wishes to see [place]. she has 4 positive lymph nodes. She underwent lumpectomy. She will start off on chemotherapy withTHC . She tolerated this poorly. She had angina and needed to have a cardiac stent placed in the right coronary artery. She's been followed by cardiology.  She is a little worried about the new chemotherapy. I talked to her at length about this. I told her that this would be very reasonable. She should be or tolerate this.  Given the 4 positive lymph nodes, she does have a significant risk of recurrence.  She's had no nausea vomiting or in she's had no diarrhea. There's been no rashes. She's had no leg swelling. She's had no fever.   Medications: Current outpatient prescriptions:acetaminophen (TYLENOL) 650 MG CR tablet, Take 650 mg by mouth every 8 (eight) hours as needed., Disp: , Rfl: ;  Ascorbic Acid (VITAMIN C) 1000 MG tablet, Take 1,000 mg by mouth daily., Disp: , Rfl: ;  aspirin EC 81 MG EC tablet, Take 1 tablet (81 mg total) by mouth daily., Disp: , Rfl: ;  atorvastatin (LIPITOR) 10 MG tablet, Take 1 tablet (10 mg total) by mouth daily at 6 PM., Disp: 30 tablet, Rfl: 6 benazepril (LOTENSIN) 10 MG tablet, Take 0.5 tablets (5 mg total) by mouth daily., Disp: 15 tablet, Rfl: 1;  Calcium Citrate-Vitamin D (CITRACAL MAXIMUM PO), Take 1 tablet by mouth daily. , Disp: , Rfl: ;  cetirizine (ZYRTEC) 10 MG tablet, Take 10 mg by mouth daily as needed for allergies., Disp: , Rfl: ;  clopidogrel (PLAVIX) 75 MG tablet, Take 1 tablet (75 mg total) by mouth daily with breakfast., Disp: 30 tablet, Rfl:  6 Cyanocobalamin (VITAMIN B-12) 2500 MCG SUBL, Place under the tongue as needed., Disp: , Rfl: ;  dexamethasone (DECADRON) 4 MG tablet, Take 8 mg by mouth 2 (two) times daily with a meal. , Disp: , Rfl: ;  HYDROcodone-acetaminophen (NORCO) 5-325 MG per tablet, Take 1-2 tablets by mouth every 6 (six) hours as needed., Disp: 30 tablet, Rfl: 0 levothyroxine (SYNTHROID, LEVOTHROID) 125 MCG tablet, Take 125 mcg by mouth daily before breakfast., Disp: , Rfl: ;  lidocaine-prilocaine (EMLA) cream, Apply topically as needed., Disp: 30 g, Rfl: 6;  Loperamide HCl (IMODIUM PO), Take by mouth as needed., Disp: , Rfl: ;  metoprolol succinate (TOPROL XL) 50 MG 24 hr tablet, Take 1 tablet (50 mg total) by mouth daily., Disp: 30 tablet, Rfl: 6 Multiple Vitamin (MULTIVITAMIN) tablet, Take 1 tablet by mouth daily.  , Disp: , Rfl: ;  Multiple Vitamins-Minerals (CENTRUM ADULTS PO), Take by mouth., Disp: , Rfl: ;  ondansetron (ZOFRAN) 8 MG tablet, Take 8 mg by mouth every 8 (eight) hours as needed for nausea or vomiting., Disp: , Rfl: ;  oxyCODONE-acetaminophen (PERCOCET/ROXICET) 5-325 MG per tablet, Take 1 tablet by mouth every 6 (six) hours as needed for severe pain., Disp: 30 tablet, Rfl:  pantoprazole (PROTONIX) 40 MG tablet, Take 1 tablet (40 mg total) by mouth daily at 6 (six) AM., Disp: 30 tablet, Rfl: 6;  pegfilgrastim (NEULASTA) 6 MG/0.6ML injection, Inject 6  mg into the skin once., Disp: , Rfl: ;  prochlorperazine (COMPAZINE) 10 MG tablet, Take 10 mg by mouth every 6 (six) hours as needed for nausea or vomiting., Disp: , Rfl:  nitroGLYCERIN (NITROSTAT) 0.4 MG SL tablet, Place 1 tablet (0.4 mg total) under the tongue every 5 (five) minutes x 3 doses as needed for chest pain., Disp: 25 tablet, Rfl: 3;  [DISCONTINUED] ferrous sulfate 325 (65 FE) MG tablet, Take 325 mg by mouth 2 (two) times daily.  , Disp: , Rfl:   Allergies:  Allergies  Allergen Reactions  . Propoxyphene N-Acetaminophen Swelling    tongue swelling  .  Penicillins Swelling    Facial swelling  . Cortisone Rash  . Pseudoephedrine Hcl Er Rash and Other (See Comments)    Face gets red    Past Medical History, Surgical history, Social history, and Family History were reviewed and updated.  Review of Systems: as above   Physical Exam:  height is 5\' 8"  (1.727 m) and weight is 171 lb (77.565 kg). Her oral temperature is 97.8 F (36.6 C). Her blood pressure is 121/59 and her pulse is 79. Her respiration is 14.    well-developed and well-nourished white female. Head and neck exam shows no ocular or oral lesions. She has alopecia. She has no adenopathy in the neck. Lungs are clear. Cardiac exam regular rate and rhythm with no murmurs rubs or bruits. Abdomen is soft. Has good bowel sounds. No fluid wave. There is no palpable liver or spleen tip. Breast exam shows right breast no masses edema or erythema. There is no right axillary adenopathy. Left breast shows a well-healed lumpectomy. This is located at 2:00 position. There is no discrete mass in the left breast. Is no left axillary adenopathy. Back exam no tenderness over the spine ribs or hips. Extremities no clubbing cyanosis or edema. Skin exam no rashes.   Lab Results  Component Value Date   WBC 8.3 01/28/2014   HGB 11.4* 01/28/2014   HCT 34.1* 01/28/2014   MCV 93.7 01/28/2014   PLT 198 01/28/2014     Chemistry      Component Value Date/Time   NA 139 02/04/2014 0948   NA 137 01/28/2014 1354   NA 136* 12/20/2013 0154   K 3.9 02/04/2014 0948   K 4.3 01/28/2014 1354   K 4.5 12/20/2013 0154   CL 104 02/04/2014 0948   CL 100 12/20/2013 0154   CO2 30 02/04/2014 0948   CO2 21* 01/28/2014 1354   CO2 23 12/20/2013 0154   BUN 25* 02/04/2014 0948   BUN 35.3* 01/28/2014 1354   BUN 31* 12/20/2013 0154   CREATININE 1.1 02/04/2014 0948   CREATININE 1.3* 01/28/2014 1354   CREATININE 0.84 12/20/2013 0154      Component Value Date/Time   CALCIUM 9.1 02/04/2014 0948   CALCIUM 9.4 01/28/2014 1354   CALCIUM 8.6 12/20/2013  0154   ALKPHOS 50 02/04/2014 0948   ALKPHOS 63 01/28/2014 1354   ALKPHOS 59 12/19/2013 1140   AST 22 02/04/2014 0948   AST 10 01/28/2014 1354   AST 17 12/19/2013 1140   ALT 19 02/04/2014 0948   ALT 13 01/28/2014 1354   ALT 36* 12/19/2013 1140   BILITOT 0.70 02/04/2014 0948   BILITOT 0.76 01/28/2014 1354   BILITOT 1.0 12/19/2013 1140      Echocardiogram done in April showed an ejection fraction of 65-70%   Impression and Plan: Kim [name] is  79 year-old postmenopausal female. She has  stage IIA adenocarcinoma the left breast. Again she had 4 positive lymph nodes.  We will start her on Abraxane.  I think that  4 cycles of Abraxane would be appropriate. She's had 3 cycles previously of THC. She will need a year of Herceptin.  We probably need to repeat her MUGA scan soon.  She will need radiation therapy.  She will need an aromatase inhibitor.  I spent about 45 minutes with Kim [name] and her family. They're all very very nice. I gave Kim [name] a prayer blanket which she very much appreciated.  We will plan to get her back to see [place] in 3 weeks. She will come in next week for day 8 of Abraxane.    [name], MD 5/6/20156:49 AM

## 2014-02-07 ENCOUNTER — Ambulatory Visit (HOSPITAL_COMMUNITY)
Admission: RE | Admit: 2014-02-07 | Discharge: 2014-02-07 | Disposition: A | Discharge status: Home / Self Care | Payer: Medicare Other | Source: Ambulatory Visit | Attending: Internal Medicine | Admitting: Internal Medicine

## 2014-02-07 VITALS — BP 116/60 | HR 93 | Wt 170.0 lb

## 2014-02-07 DIAGNOSIS — C773 Secondary and unspecified malignant neoplasm of axilla and upper limb lymph nodes: Secondary | ICD-10-CM | POA: Insufficient documentation

## 2014-02-07 DIAGNOSIS — I2510 Atherosclerotic heart disease of native coronary artery without angina pectoris: Secondary | ICD-10-CM | POA: Insufficient documentation

## 2014-02-07 DIAGNOSIS — I421 Obstructive hypertrophic cardiomyopathy: Secondary | ICD-10-CM

## 2014-02-07 DIAGNOSIS — I200 Unstable angina: Secondary | ICD-10-CM | POA: Insufficient documentation

## 2014-02-07 DIAGNOSIS — Z9221 Personal history of antineoplastic chemotherapy: Secondary | ICD-10-CM | POA: Insufficient documentation

## 2014-02-07 DIAGNOSIS — E039 Hypothyroidism, unspecified: Secondary | ICD-10-CM | POA: Insufficient documentation

## 2014-02-07 DIAGNOSIS — C50919 Malignant neoplasm of unspecified site of unspecified female breast: Secondary | ICD-10-CM | POA: Insufficient documentation

## 2014-02-07 MED ORDER — METOPROLOL SUCCINATE ER 50 MG PO TB24: ORAL_TABLET | ORAL | Status: DC

## 2014-02-07 NOTE — Patient Instructions (Signed)
Stop Benazepril   Increase Metoprolol to 75 mg (1 & 1/2 tabs) daily  Your physician has recommended that you wear a holter monitor. Holter monitors are medical devices that record the heart's electrical activity. Doctors most often use these monitors to diagnose arrhythmias. Arrhythmias are problems with the speed or rhythm of the heartbeat. The monitor is a small, portable device. You can wear one while you do your normal daily activities. This is usually used to diagnose what is causing palpitations/syncope (passing out).  Cardiac MRI is on Wed 5/13, please arrive to Main Entrance of [facility] at 11:15 to register, then report to Radiology   We will contact you to schedule your next appointment and echocardiogram for the end of July

## 2014-02-07 NOTE — Progress Notes (Signed)
Patient ID: Kim [name], female   DOB: 11/16/1934, 79 y.o.   MRN: [id number] PCP: Dr. Sarajane Jews Oncologist: Dr. Humphrey Rolls  79 yo with history of CAD and HOCM presents for cardiology evaluation given use of Herceptin with her breast cancer chemo.  Patient is s/p lumpectomy with lymph node biopsy in 2/15.  4/10 nodes positive.  She was started on docetaxol/carboplatin/Herceptin in 3/15 with plan for 6 cycles chemo. She had a tough time with her 1st chemotherapy session and felt very weak afterwards.  Shortly after, she developed unstable angina and ended up getting a DES to the mid RCA in 3/15.  Patient additionally has a history of HOCM.  This has been recognized on prior echoes.  She has severe asymmetric basal septal hypertrophy and SAM with LVOT gradient peak 58 mmHg on echo in 3/15 along with moderate MR.  I have been working on increasing her beta blocker.   She returns for followup today.  She is now getting abraxane/Herceptin x 4 cycles with plan to complete 1 year Herceptin.  I reviewed her most recent echo from 4/15.  This showed normal EF, LVOT gradient down to 33 mmHg.  Lateral S' stable.    Overall, she is doing well.  She is weak at times but generally holding her own.  No chest pain.  No exertional dyspnea.  Some fatigue walking up a flight of steps.  No orthopnea/PND.   ECG (3/15): NSR, lateral T wave inversions  Labs (3/15): K 4.5, creatinine 0.84, LDL 91 Labs (5/15): K 3.9, creatinine 1.1  PMH: 1. CAD: Unstable angina 3/15 with LHC showing 99% mRCA stenosis, treated with DES to mRCA.  2. Hypothyroidism 3. Diverticulosis 4. HTN 5. H/o TAH/BSO 6. Breast cancer: s/p lumpectomy with lymph node biopsy in 2/15.  4/10 nodes positive.  She was started on docetaxol/carboplatin/Herceptin in 3/15 with plan for 6 cycles chemo.  7. Hypertrophic obstructive cardiomyopathy: Echo (3/15) with severe focal basal septal hypertrophy (22 mm), narrow LV outflow tract with mitral valve SAM and 58 mmHg peak  LVOT resting gradient, EF 60-65%, moderate MR, moderate LAE, normal RV, lateral s' 10.4 cm/sec.  Patient says that her 2 grown sons has had echoes to screen for HOCM.  Echo (4/15) with EF 65-70%, severe focal basal septal hypertrophy, LVOT gradient 33 mmHg, SAM was present with moderate MR, normal RV size and systolic function, lateral S' 10.6, GLS -17.5%.   SH: Married, 2 children, retired, nonsmoker.   FH: No family history of HOCM or sudden death.  There is a family history of CAD.   ROS: All systems reviewed and negative except as per HPI.    Current Outpatient Prescriptions  Medication Sig Dispense Refill  . acetaminophen (TYLENOL) 650 MG CR tablet Take 650 mg by mouth every 8 (eight) hours as needed.      . Ascorbic Acid (VITAMIN C) 1000 MG tablet Take 1,000 mg by mouth daily.      Marland Kitchen aspirin EC 81 MG EC tablet Take 1 tablet (81 mg total) by mouth daily.      Marland Kitchen atorvastatin (LIPITOR) 10 MG tablet Take 1 tablet (10 mg total) by mouth daily at 6 PM.  30 tablet  6  . Calcium Citrate-Vitamin D (CITRACAL MAXIMUM PO) Take 1 tablet by mouth daily.       . cetirizine (ZYRTEC) 10 MG tablet Take 10 mg by mouth daily as needed for allergies.      Marland Kitchen clopidogrel (PLAVIX) 75 MG tablet Take 1  tablet (75 mg total) by mouth daily with breakfast.  30 tablet  6  . Cyanocobalamin (VITAMIN B-12) 2500 MCG SUBL Place under the tongue as needed.      Marland Kitchen dexamethasone (DECADRON) 4 MG tablet Take 8 mg by mouth 2 (two) times daily with a meal.       . HYDROcodone-acetaminophen (NORCO) 5-325 MG per tablet Take 1-2 tablets by mouth every 6 (six) hours as needed.  30 tablet  0  . levothyroxine (SYNTHROID, LEVOTHROID) 125 MCG tablet Take 125 mcg by mouth daily before breakfast.      . lidocaine-prilocaine (EMLA) cream Apply topically as needed.  30 g  6  . Loperamide HCl (IMODIUM PO) Take by mouth as needed.      . metoprolol succinate (TOPROL-XL) 50 MG 24 hr tablet Take 1 & 1/2 tabs daily  45 tablet  6  . Multiple  Vitamin (MULTIVITAMIN) tablet Take 1 tablet by mouth daily.        . Multiple Vitamins-Minerals (CENTRUM ADULTS PO) Take by mouth.      . nitroGLYCERIN (NITROSTAT) 0.4 MG SL tablet Place 1 tablet (0.4 mg total) under the tongue every 5 (five) minutes x 3 doses as needed for chest pain.  25 tablet  3  . ondansetron (ZOFRAN) 8 MG tablet Take 8 mg by mouth every 8 (eight) hours as needed for nausea or vomiting.      Marland Kitchen oxyCODONE-acetaminophen (PERCOCET/ROXICET) 5-325 MG per tablet Take 1 tablet by mouth every 6 (six) hours as needed for severe pain.  30 tablet    . pantoprazole (PROTONIX) 40 MG tablet Take 1 tablet (40 mg total) by mouth daily at 6 (six) AM.  30 tablet  6  . pegfilgrastim (NEULASTA) 6 MG/0.6ML injection Inject 6 mg into the skin once.      . prochlorperazine (COMPAZINE) 10 MG tablet Take 10 mg by mouth every 6 (six) hours as needed for nausea or vomiting.      . [DISCONTINUED] ferrous sulfate 325 (65 FE) MG tablet Take 325 mg by mouth 2 (two) times daily.         No current facility-administered medications for this encounter.   BP 116/60  Pulse 93  Wt 170 lb (77.111 kg)  SpO2 99% General: NAD Neck: No JVD, no thyromegaly or thyroid nodule.  Lungs: Clear to auscultation bilaterally with normal respiratory effort. CV: Nondisplaced PMI.  Heart regular S1/S2, no S3/S4, 3/6 SEM RUSB.  No peripheral edema.  No carotid bruit.  Normal pedal pulses.  Abdomen: Soft, nontender, no hepatosplenomegaly, no distention.  Skin: Intact without lesions or rashes.  Neurologic: Alert and oriented x 3.  Psych: Normal affect. Extremities: No clubbing or cyanosis.   Assessment/Plan: 1. CAD: Status post PCI for unstable angina in 3/15 with DES to [facility].  - Continue ASA 81, statin, Plavix, and Toprol XL.  - Should participate in cardiac rehab.  2. Hyperlipidemia: Myalgias with atoravastatin 40 mg daily but she can tolerate 10 mg daily.  I will have her continue this.  Lipids/LFTs in 6/15.  3.  Hypertrophic obstructive cardiomyopathy: No family history of HOCM or sudden death (interestingly, her husband has HOCM).  She has a moderate LVOT gradient with mitral valve SAM and moderate MR.  She does not have significant exertional symptoms. She has never passed out.  LVOT gradient was lower on most recent echo after increasing Toprol XL.  - I am going to get a 24 hour holter monitor to screen for ventricular  arrhythmias.  - I am going to get a cardiac MRI to assess scar burden in the myocardium (scheduled for next week).  - I will increase Toprol XL to 75 mg daily to help lower LVOT gradient and will also stop benazepril as afterload reduction is not ideal with HOCM.   - Per patient, her 2 sons (in their 38s) have had screening echoes to look for hypertrophic cardiomyopathy and did not show signs of HOCM.   4. Breast cancer/Herceptin: Patient had a baseline echo in 3/15 prior to receiving 1st dose of Herceptin-containing chemotherapy, then had unstable angina with PCI.  Most recent echo showed stable EF and lateral S'.   - Echo and office visit in 3 months.    Larey Dresser 02/07/2014

## 2014-02-07 NOTE — Telephone Encounter (Signed)
Discussed at [place] 5/7

## 2014-02-08 ENCOUNTER — Ambulatory Visit: Payer: Medicare Other

## 2014-02-11 ENCOUNTER — Ambulatory Visit: Payer: Medicare Other | Admitting: Adult Health

## 2014-02-11 ENCOUNTER — Other Ambulatory Visit (HOSPITAL_BASED_OUTPATIENT_CLINIC_OR_DEPARTMENT_OTHER): Payer: Medicare Other | Admitting: Lab

## 2014-02-11 ENCOUNTER — Telehealth: Payer: Self-pay | Admitting: Hematology & Oncology

## 2014-02-11 ENCOUNTER — Ambulatory Visit (HOSPITAL_BASED_OUTPATIENT_CLINIC_OR_DEPARTMENT_OTHER): Payer: Medicare Other

## 2014-02-11 ENCOUNTER — Other Ambulatory Visit: Payer: Medicare Other

## 2014-02-11 ENCOUNTER — Ambulatory Visit: Payer: Medicare Other

## 2014-02-11 ENCOUNTER — Ambulatory Visit: Payer: Medicare Other | Admitting: Gastroenterology

## 2014-02-11 VITALS — BP 104/68 | HR 71 | Temp 98.0°F | Resp 16

## 2014-02-11 DIAGNOSIS — C50919 Malignant neoplasm of unspecified site of unspecified female breast: Secondary | ICD-10-CM

## 2014-02-11 DIAGNOSIS — C50412 Malignant neoplasm of upper-outer quadrant of left female breast: Secondary | ICD-10-CM

## 2014-02-11 DIAGNOSIS — Z5111 Encounter for antineoplastic chemotherapy: Secondary | ICD-10-CM

## 2014-02-11 DIAGNOSIS — C50419 Malignant neoplasm of upper-outer quadrant of unspecified female breast: Secondary | ICD-10-CM

## 2014-02-11 LAB — CBC WITH DIFFERENTIAL (CANCER CENTER ONLY)
BASO#: 0 10*3/uL (ref 0.0–0.2)
BASO%: 0.3 % (ref 0.0–2.0)
EOS%: 1.6 % (ref 0.0–7.0)
Eosinophils Absolute: 0.1 10*3/uL (ref 0.0–0.5)
HCT: 30.5 % — ABNORMAL LOW (ref 34.8–46.6)
HGB: 10.2 g/dL — ABNORMAL LOW (ref 11.6–15.9)
LYMPH#: 1 10*3/uL (ref 0.9–3.3)
LYMPH%: 18 % (ref 14.0–48.0)
MCH: 32.4 pg (ref 26.0–34.0)
MCHC: 33.4 g/dL (ref 32.0–36.0)
MCV: 97 fL (ref 81–101)
MONO#: 0.2 10*3/uL (ref 0.1–0.9)
MONO%: 2.6 % (ref 0.0–13.0)
NEUT#: 4.5 10*3/uL (ref 1.5–6.5)
NEUT%: 77.5 % (ref 39.6–80.0)
Platelets: 137 10*3/uL — ABNORMAL LOW (ref 145–400)
RBC: 3.15 10*6/uL — ABNORMAL LOW (ref 3.70–5.32)
RDW: 15.5 % (ref 11.1–15.7)
WBC: 5.8 10*3/uL (ref 3.9–10.0)

## 2014-02-11 LAB — CMP (CANCER CENTER ONLY)
ALT(SGPT): 17 U/L (ref 10–47)
AST: 14 U/L (ref 11–38)
Albumin: 3.1 g/dL — ABNORMAL LOW (ref 3.3–5.5)
Alkaline Phosphatase: 48 U/L (ref 26–84)
BUN, Bld: 26 mg/dL — ABNORMAL HIGH (ref 7–22)
CO2: 29 mEq/L (ref 18–33)
Calcium: 8.6 mg/dL (ref 8.0–10.3)
Chloride: 106 mEq/L (ref 98–108)
Creat: 1.3 mg/dl — ABNORMAL HIGH (ref 0.6–1.2)
Glucose, Bld: 84 mg/dL (ref 73–118)
Potassium: 3.7 mEq/L (ref 3.3–4.7)
Sodium: 135 mEq/L (ref 128–145)
Total Bilirubin: 0.6 mg/dl (ref 0.20–1.60)
Total Protein: 5.4 g/dL — ABNORMAL LOW (ref 6.4–8.1)

## 2014-02-11 MED ORDER — ONDANSETRON 8 MG/50ML IVPB (CHCC)
8.0000 mg | Freq: Once | INTRAVENOUS | Status: AC
  Administered 2014-02-11: 8 mg via INTRAVENOUS

## 2014-02-11 MED ORDER — HEPARIN SOD (PORK) LOCK FLUSH 100 UNIT/ML IV SOLN
500.0000 [IU] | Freq: Once | INTRAVENOUS | Status: AC | PRN
  Administered 2014-02-11: 500 [IU]
  Filled 2014-02-11: qty 5

## 2014-02-11 MED ORDER — DEXAMETHASONE SODIUM PHOSPHATE 20 MG/5ML IJ SOLN
INTRAMUSCULAR | Status: AC
  Filled 2014-02-11: qty 5

## 2014-02-11 MED ORDER — DEXAMETHASONE SODIUM PHOSPHATE 10 MG/ML IJ SOLN
INTRAMUSCULAR | Status: AC
  Filled 2014-02-11: qty 1

## 2014-02-11 MED ORDER — SODIUM CHLORIDE 0.9 % IJ SOLN
10.0000 mL | INTRAMUSCULAR | Status: DC | PRN
  Administered 2014-02-11: 10 mL
  Filled 2014-02-11: qty 10

## 2014-02-11 MED ORDER — SODIUM CHLORIDE 0.9 % IV SOLN
Freq: Once | INTRAVENOUS | Status: AC
  Administered 2014-02-11: 15:00:00 via INTRAVENOUS

## 2014-02-11 MED ORDER — DEXAMETHASONE SODIUM PHOSPHATE 10 MG/ML IJ SOLN
10.0000 mg | Freq: Once | INTRAMUSCULAR | Status: AC
  Administered 2014-02-11: 10 mg via INTRAVENOUS

## 2014-02-11 MED ORDER — PACLITAXEL PROTEIN-BOUND CHEMO INJECTION 100 MG
100.0000 mg/m2 | Freq: Once | INTRAVENOUS | Status: AC
  Administered 2014-02-11: 200 mg via INTRAVENOUS
  Filled 2014-02-11: qty 40

## 2014-02-11 NOTE — Patient Instructions (Signed)

## 2014-02-11 NOTE — Telephone Encounter (Signed)
Pt cx 6-1 said MD told her that was ok. I left RN message to check with MD

## 2014-02-13 ENCOUNTER — Ambulatory Visit (HOSPITAL_COMMUNITY)
Admission: RE | Admit: 2014-02-13 | Discharge: 2014-02-13 | Disposition: A | Discharge status: Home / Self Care | Payer: Medicare Other | Source: Ambulatory Visit | Attending: Cardiology | Admitting: Cardiology

## 2014-02-13 DIAGNOSIS — I421 Obstructive hypertrophic cardiomyopathy: Secondary | ICD-10-CM

## 2014-02-13 DIAGNOSIS — I422 Other hypertrophic cardiomyopathy: Secondary | ICD-10-CM | POA: Insufficient documentation

## 2014-02-13 MED ORDER — GADOBENATE DIMEGLUMINE 529 MG/ML IV SOLN
25.0000 mL | Freq: Once | INTRAVENOUS | Status: AC
  Administered 2014-02-13: 25 mL via INTRAVENOUS

## 2014-02-18 ENCOUNTER — Other Ambulatory Visit (HOSPITAL_BASED_OUTPATIENT_CLINIC_OR_DEPARTMENT_OTHER): Payer: Medicare Other | Admitting: Lab

## 2014-02-18 ENCOUNTER — Ambulatory Visit (HOSPITAL_BASED_OUTPATIENT_CLINIC_OR_DEPARTMENT_OTHER): Payer: Medicare Other

## 2014-02-18 ENCOUNTER — Telehealth: Payer: Self-pay | Admitting: *Deleted

## 2014-02-18 ENCOUNTER — Other Ambulatory Visit: Payer: Self-pay | Admitting: *Deleted

## 2014-02-18 VITALS — BP 130/53 | HR 70 | Temp 98.1°F | Resp 18

## 2014-02-18 DIAGNOSIS — R112 Nausea with vomiting, unspecified: Secondary | ICD-10-CM

## 2014-02-18 DIAGNOSIS — C50419 Malignant neoplasm of upper-outer quadrant of unspecified female breast: Secondary | ICD-10-CM

## 2014-02-18 DIAGNOSIS — C50919 Malignant neoplasm of unspecified site of unspecified female breast: Secondary | ICD-10-CM

## 2014-02-18 DIAGNOSIS — C50412 Malignant neoplasm of upper-outer quadrant of left female breast: Secondary | ICD-10-CM

## 2014-02-18 LAB — BASIC METABOLIC PANEL - CANCER CENTER ONLY
BUN, Bld: 13 mg/dL (ref 7–22)
CO2: 27 mEq/L (ref 18–33)
Calcium: 8.2 mg/dL (ref 8.0–10.3)
Chloride: 99 mEq/L (ref 98–108)
Creat: 0.9 mg/dl (ref 0.6–1.2)
Glucose, Bld: 113 mg/dL (ref 73–118)
Potassium: 3.4 mEq/L (ref 3.3–4.7)
Sodium: 137 mEq/L (ref 128–145)

## 2014-02-18 MED ORDER — HEPARIN SOD (PORK) LOCK FLUSH 100 UNIT/ML IV SOLN
500.0000 [IU] | Freq: Once | INTRAVENOUS | Status: AC
  Administered 2014-02-18: 500 [IU] via INTRAVENOUS
  Filled 2014-02-18: qty 5

## 2014-02-18 MED ORDER — SODIUM CHLORIDE 0.9 % IV SOLN
1000.0000 mL | Freq: Once | INTRAVENOUS | Status: AC
  Administered 2014-02-18: 1000 mL via INTRAVENOUS

## 2014-02-18 MED ORDER — SODIUM CHLORIDE 0.9 % IJ SOLN
10.0000 mL | INTRAMUSCULAR | Status: DC | PRN
  Administered 2014-02-18: 10 mL via INTRAVENOUS
  Filled 2014-02-18: qty 10

## 2014-02-18 NOTE — Patient Instructions (Signed)
Dehydration, Adult Dehydration is when you lose more fluids from the body than you take in. Vital organs like the kidneys, brain, and heart cannot function without a proper amount of fluids and salt. Any loss of fluids from the body can cause dehydration.  CAUSES   Vomiting.  Diarrhea.  Excessive sweating.  Excessive urine output.  Fever. SYMPTOMS  Mild dehydration  Thirst.  Dry lips.  Slightly dry mouth. Moderate dehydration  Very dry mouth.  Sunken eyes.  Skin does not bounce back quickly when lightly pinched and released.  Dark urine and decreased urine production.  Decreased tear production.  Headache. Severe dehydration  Very dry mouth.  Extreme thirst.  Rapid, weak pulse (more than 100 beats per minute at rest).  Cold hands and feet.  Not able to sweat in spite of heat and temperature.  Rapid breathing.  Blue lips.  Confusion and lethargy.  Difficulty being awakened.  Minimal urine production.  No tears. DIAGNOSIS  Your caregiver will diagnose dehydration based on your symptoms and your exam. Blood and urine tests will help confirm the diagnosis. The diagnostic evaluation should also identify the cause of dehydration. TREATMENT  Treatment of mild or moderate dehydration can often be done at home by increasing the amount of fluids that you drink. It is best to drink small amounts of fluid more often. Drinking too much at one time can make vomiting worse. Refer to the home care instructions below. Severe dehydration needs to be treated at the hospital where you will probably be given intravenous (IV) fluids that contain water and electrolytes. HOME CARE INSTRUCTIONS   Ask your caregiver about specific rehydration instructions.  Drink enough fluids to keep your urine clear or pale yellow.  Drink small amounts frequently if you have nausea and vomiting.  Eat as you normally do.  Avoid:  Foods or drinks high in sugar.  Carbonated  drinks.  Juice.  Extremely hot or cold fluids.  Drinks with caffeine.  Fatty, greasy foods.  Alcohol.  Tobacco.  Overeating.  Gelatin desserts.  Wash your hands well to avoid spreading bacteria and viruses.  Only take over-the-counter or prescription medicines for pain, discomfort, or fever as directed by your caregiver.  Ask your caregiver if you should continue all prescribed and over-the-counter medicines.  Keep all follow-up appointments with your caregiver. SEEK MEDICAL CARE IF:  You have abdominal pain and it increases or stays in one area (localizes).  You have a rash, stiff neck, or severe headache.  You are irritable, sleepy, or difficult to awaken.  You are weak, dizzy, or extremely thirsty. SEEK IMMEDIATE MEDICAL CARE IF:   You are unable to keep fluids down or you get worse despite treatment.  You have frequent episodes of vomiting or diarrhea.  You have blood or green matter (bile) in your vomit.  You have blood in your stool or your stool looks black and tarry.  You have not urinated in 6 to 8 hours, or you have only urinated a small amount of very dark urine.  You have a fever.  You faint. MAKE SURE YOU:   Understand these instructions.  Will watch your condition.  Will get help right away if you are not doing well or get worse. Document Released: 09/20/2005 Document Revised: 12/13/2011 Document Reviewed: 05/10/2011 ExitCare Patient Information 2014 [employer].  

## 2014-02-18 NOTE — Telephone Encounter (Signed)
Received call from patient stating that she has been sick since last chemotherapy with nausea and vomiting and diarrhea since last chemotherapy.  Unable to eat much or drink well.  Spoke with Dr.Ennever.  He said to bring patient in for fluids.  Appointment made.

## 2014-02-19 ENCOUNTER — Telehealth: Payer: Self-pay | Admitting: Cardiology

## 2014-02-19 NOTE — Telephone Encounter (Signed)
New message ° ° ° ° ° °Want MRI results  °

## 2014-02-19 NOTE — Telephone Encounter (Signed)
Spoke to patient to get permission to return her daugther's Aris [place]) phone call asking for test results. Patient verbalized that she "shouldn't have to wait to get a call from the doctor with her test results". She verbalized that the test was last week and she still does not have results and that the test "messed up her insides" and "took 4 bottles of medicine to get me regular again" (constipation).  She is concerned the doctors are not following through on her care, as she has cancer and the heart problem that she is worried about together. She verbalized that she couldn't understand why she has to give permission for [place] to speak with her daughter when her daughter is on her Emergency Contact List.  Reassurance provided regarding her concerns. Explained HIPAA to her and she did provide verbal consent to speak with her daughter regarding any medical results/discussions. This was noted on her SnapShot page in the chart. Provided test results of the MRI to her, per Dr. Claris Gladden notes. Provided education regarding Hypertrophic Cardiomyopathy. Patient requests Dr. Aundra Dubin call her to further discuss these results. Attempted to call her daughter, Aris Georgia [facility].

## 2014-02-20 ENCOUNTER — Encounter: Payer: Medicare Other | Admitting: Cardiology

## 2014-02-20 NOTE — Telephone Encounter (Signed)
Will have Dr Aundra Dubin call pt tomorrow when he is in clinic

## 2014-02-20 NOTE — Telephone Encounter (Signed)
I left messages for her earlier this week and never got a call back.  I tried to call Kim [name] and her daughter today and left messages again.  I will try again tomorrow.

## 2014-02-26 ENCOUNTER — Ambulatory Visit (HOSPITAL_BASED_OUTPATIENT_CLINIC_OR_DEPARTMENT_OTHER): Payer: Medicare Other | Admitting: Hematology & Oncology

## 2014-02-26 ENCOUNTER — Encounter: Payer: Self-pay | Admitting: Hematology & Oncology

## 2014-02-26 ENCOUNTER — Ambulatory Visit (HOSPITAL_BASED_OUTPATIENT_CLINIC_OR_DEPARTMENT_OTHER): Payer: Medicare Other

## 2014-02-26 ENCOUNTER — Other Ambulatory Visit (HOSPITAL_BASED_OUTPATIENT_CLINIC_OR_DEPARTMENT_OTHER): Payer: Medicare Other | Admitting: Lab

## 2014-02-26 VITALS — BP 115/59 | HR 69 | Temp 98.1°F | Resp 14 | Ht 68.0 in | Wt 172.0 lb

## 2014-02-26 DIAGNOSIS — Z5111 Encounter for antineoplastic chemotherapy: Secondary | ICD-10-CM

## 2014-02-26 DIAGNOSIS — C50412 Malignant neoplasm of upper-outer quadrant of left female breast: Secondary | ICD-10-CM

## 2014-02-26 DIAGNOSIS — C50919 Malignant neoplasm of unspecified site of unspecified female breast: Secondary | ICD-10-CM

## 2014-02-26 DIAGNOSIS — Z5112 Encounter for antineoplastic immunotherapy: Secondary | ICD-10-CM

## 2014-02-26 DIAGNOSIS — Z170 Estrogen receptor positive status [ER+]: Secondary | ICD-10-CM

## 2014-02-26 DIAGNOSIS — C50419 Malignant neoplasm of upper-outer quadrant of unspecified female breast: Secondary | ICD-10-CM

## 2014-02-26 LAB — CBC WITH DIFFERENTIAL (CANCER CENTER ONLY)
BASO#: 0 10*3/uL (ref 0.0–0.2)
BASO%: 0.4 % (ref 0.0–2.0)
EOS%: 0 % (ref 0.0–7.0)
Eosinophils Absolute: 0 10*3/uL (ref 0.0–0.5)
HCT: 28.9 % — ABNORMAL LOW (ref 34.8–46.6)
HGB: 9.4 g/dL — ABNORMAL LOW (ref 11.6–15.9)
LYMPH#: 0.7 10*3/uL — ABNORMAL LOW (ref 0.9–3.3)
LYMPH%: 6.6 % — ABNORMAL LOW (ref 14.0–48.0)
MCH: 32 pg (ref 26.0–34.0)
MCHC: 32.5 g/dL (ref 32.0–36.0)
MCV: 98 fL (ref 81–101)
MONO#: 0.5 10*3/uL (ref 0.1–0.9)
MONO%: 5.2 % (ref 0.0–13.0)
NEUT#: 9.1 10*3/uL — ABNORMAL HIGH (ref 1.5–6.5)
NEUT%: 87.8 % — ABNORMAL HIGH (ref 39.6–80.0)
Platelets: 222 10*3/uL (ref 145–400)
RBC: 2.94 10*6/uL — ABNORMAL LOW (ref 3.70–5.32)
RDW: 16.4 % — ABNORMAL HIGH (ref 11.1–15.7)
WBC: 10.4 10*3/uL — ABNORMAL HIGH (ref 3.9–10.0)

## 2014-02-26 LAB — CMP (CANCER CENTER ONLY)
ALT(SGPT): 23 U/L (ref 10–47)
AST: 16 U/L (ref 11–38)
Albumin: 3.1 g/dL — ABNORMAL LOW (ref 3.3–5.5)
Alkaline Phosphatase: 43 U/L (ref 26–84)
BUN, Bld: 18 mg/dL (ref 7–22)
CO2: 27 mEq/L (ref 18–33)
Calcium: 9.2 mg/dL (ref 8.0–10.3)
Chloride: 100 mEq/L (ref 98–108)
Creat: 0.9 mg/dl (ref 0.6–1.2)
Glucose, Bld: 111 mg/dL (ref 73–118)
Potassium: 3.4 mEq/L (ref 3.3–4.7)
Sodium: 139 mEq/L (ref 128–145)
Total Bilirubin: 0.7 mg/dl (ref 0.20–1.60)
Total Protein: 6.2 g/dL — ABNORMAL LOW (ref 6.4–8.1)

## 2014-02-26 MED ORDER — ACETAMINOPHEN 325 MG PO TABS
ORAL_TABLET | ORAL | Status: AC
  Filled 2014-02-26: qty 2

## 2014-02-26 MED ORDER — DIPHENHYDRAMINE HCL 25 MG PO CAPS
50.0000 mg | ORAL_CAPSULE | Freq: Once | ORAL | Status: AC
  Administered 2014-02-26: 50 mg via ORAL

## 2014-02-26 MED ORDER — DIPHENHYDRAMINE HCL 25 MG PO CAPS
ORAL_CAPSULE | ORAL | Status: AC
  Filled 2014-02-26: qty 2

## 2014-02-26 MED ORDER — SODIUM CHLORIDE 0.9 % IV SOLN: Freq: Once | INTRAVENOUS | Status: DC

## 2014-02-26 MED ORDER — PACLITAXEL PROTEIN-BOUND CHEMO INJECTION 100 MG
75.0000 mg/m2 | Freq: Once | INTRAVENOUS | Status: AC
  Administered 2014-02-26: 150 mg via INTRAVENOUS
  Filled 2014-02-26: qty 30

## 2014-02-26 MED ORDER — ACETAMINOPHEN 325 MG PO TABS
650.0000 mg | ORAL_TABLET | Freq: Once | ORAL | Status: AC
  Administered 2014-02-26: 650 mg via ORAL

## 2014-02-26 MED ORDER — DEXAMETHASONE SODIUM PHOSPHATE 10 MG/ML IJ SOLN
INTRAMUSCULAR | Status: AC
  Filled 2014-02-26: qty 1

## 2014-02-26 MED ORDER — SODIUM CHLORIDE 0.9 % IJ SOLN
10.0000 mL | INTRAMUSCULAR | Status: DC | PRN
  Filled 2014-02-26: qty 10

## 2014-02-26 MED ORDER — HEPARIN SOD (PORK) LOCK FLUSH 100 UNIT/ML IV SOLN
500.0000 [IU] | Freq: Once | INTRAVENOUS | Status: DC | PRN
  Filled 2014-02-26: qty 5

## 2014-02-26 MED ORDER — SODIUM CHLORIDE 0.9 % IV SOLN
Freq: Once | INTRAVENOUS | Status: AC
  Administered 2014-02-26: 11:00:00 via INTRAVENOUS

## 2014-02-26 MED ORDER — LOPERAMIDE HCL 2 MG PO CAPS
4.0000 mg | ORAL_CAPSULE | ORAL | Status: DC | PRN
  Administered 2014-02-26: 4 mg via ORAL

## 2014-02-26 MED ORDER — LOPERAMIDE HCL 2 MG PO CAPS
ORAL_CAPSULE | ORAL | Status: AC
Start: 2014-02-26 — End: 2014-02-26
  Filled 2014-02-26: qty 2

## 2014-02-26 MED ORDER — ONDANSETRON 8 MG/50ML IVPB (CHCC)
8.0000 mg | Freq: Once | INTRAVENOUS | Status: AC
  Administered 2014-02-26: 8 mg via INTRAVENOUS

## 2014-02-26 MED ORDER — DEXAMETHASONE SODIUM PHOSPHATE 10 MG/ML IJ SOLN
10.0000 mg | Freq: Once | INTRAMUSCULAR | Status: AC
  Administered 2014-02-26: 10 mg via INTRAVENOUS

## 2014-02-26 MED ORDER — SODIUM CHLORIDE 0.9 % IV SOLN
6.0000 mg/kg | Freq: Once | INTRAVENOUS | Status: AC
  Administered 2014-02-26: 462 mg via INTRAVENOUS
  Filled 2014-02-26: qty 22

## 2014-02-26 NOTE — Patient Instructions (Signed)
Nanoparticle Albumin-Bound Paclitaxel injection What is this medicine? NANOPARTICLE ALBUMIN-BOUND PACLITAXEL (Na no PAHR ti kuhl al BYOO muhn-bound PAK li TAX el) is a chemotherapy drug. It targets fast dividing cells, like cancer cells, and causes these cells to die. This medicine is used to treat advanced breast cancer and advanced lung cancer. This medicine may be used for other purposes; ask your health care provider or pharmacist if you have questions. COMMON BRAND NAME(S): Abraxane What should I tell my health care provider before I take this medicine? They need to know if you have any of these conditions: -kidney disease -liver disease -low blood counts, like low platelets, red blood cells, or white blood cells -recent or ongoing radiation therapy -an unusual or allergic reaction to paclitaxel, albumin, other chemotherapy, other medicines, foods, dyes, or preservatives -pregnant or trying to get pregnant -breast-feeding How should I use this medicine? This drug is given as an infusion into a vein. It is administered in a hospital or clinic by a specially trained health care professional. Talk to your pediatrician regarding the use of this medicine in children. Special care may be needed. Overdosage: If you think you have taken too much of this medicine contact a poison control center or emergency room at once. NOTE: This medicine is only for you. Do not share this medicine with others. What if I miss a dose? It is important not to miss your dose. Call your doctor or health care professional if you are unable to keep an appointment. What may interact with this medicine? -cyclosporine -diazepam -ketoconazole -medicines to increase blood counts like filgrastim, pegfilgrastim, sargramostim -other chemotherapy drugs like cisplatin, doxorubicin, epirubicin, etoposide, teniposide, vincristine -quinidine -testosterone -vaccines -verapamil Talk to your doctor or health care professional  before taking any of these medicines: -acetaminophen -aspirin -ibuprofen -ketoprofen -naproxen This list may not describe all possible interactions. Give your health care provider a list of all the medicines, herbs, non-prescription drugs, or dietary supplements you use. Also tell them if you smoke, drink alcohol, or use illegal drugs. Some items may interact with your medicine. What should I watch for while using this medicine? Your condition will be monitored carefully while you are receiving this medicine. You will need important blood work done while you are taking this medicine. This drug may make you feel generally unwell. This is not uncommon, as chemotherapy can affect healthy cells as well as cancer cells. Report any side effects. Continue your course of treatment even though you feel ill unless your doctor tells you to stop. In some cases, you may be given additional medicines to help with side effects. Follow all directions for their use. Call your doctor or health care professional for advice if you get a fever, chills or sore throat, or other symptoms of a cold or flu. Do not treat yourself. This drug decreases your body's ability to fight infections. Try to avoid being around people who are sick. This medicine may increase your risk to bruise or bleed. Call your doctor or health care professional if you notice any unusual bleeding. Be careful brushing and flossing your teeth or using a toothpick because you may get an infection or bleed more easily. If you have any dental work done, tell your dentist you are receiving this medicine. Avoid taking products that contain aspirin, acetaminophen, ibuprofen, naproxen, or ketoprofen unless instructed by your doctor. These medicines may hide a fever. Do not become pregnant while taking this medicine. Women should inform their doctor if they wish  to become pregnant or think they might be pregnant. There is a potential for serious side effects to  an unborn child. Talk to your health care professional or pharmacist for more information. Do not breast-feed an infant while taking this medicine. Men are advised not to father a child while receiving this medicine. What side effects may I notice from receiving this medicine? Side effects that you should report to your doctor or health care professional as soon as possible: -allergic reactions like skin rash, itching or hives, swelling of the face, lips, or tongue -low blood counts - This drug may decrease the number of white blood cells, red blood cells and platelets. You may be at increased risk for infections and bleeding. -signs of infection - fever or chills, cough, sore throat, pain or difficulty passing urine -signs of decreased platelets or bleeding - bruising, pinpoint red spots on the skin, black, tarry stools, nosebleeds -signs of decreased red blood cells - unusually weak or tired, fainting spells, lightheadedness -breathing problems -changes in vision -chest pain -high or low blood pressure -mouth sores -nausea and vomiting -pain, swelling, redness or irritation at the injection site -pain, tingling, numbness in the hands or feet -slow or irregular heartbeat -swelling of the ankle, feet, hands Side effects that usually do not require medical attention (report to your doctor or health care professional if they continue or are bothersome): -aches, pains -changes in the color of fingernails -diarrhea -hair loss -loss of appetite This list may not describe all possible side effects. Call your doctor for medical advice about side effects. You may report side effects to FDA at 1-800-FDA-1088. Where should I keep my medicine? This drug is given in a hospital or clinic and will not be stored at home. NOTE: This sheet is a summary. It may not cover all possible information. If you have questions about this medicine, talk to your doctor, pharmacist, or health care provider.  2014,  Elsevier/Gold Standard. (2012-11-13 16:48:50) Trastuzumab injection for infusion What is this medicine? TRASTUZUMAB (tras TOO zoo mab) is a monoclonal antibody. It targets a protein called HER2. This protein is found in some stomach and breast cancers. This medicine can stop cancer cell growth. This medicine may be used with other cancer treatments. This medicine may be used for other purposes; ask your health care provider or pharmacist if you have questions. COMMON BRAND NAME(S): Herceptin What should I tell my health care provider before I take this medicine? They need to know if you have any of these conditions: -heart disease -heart failure -infection (especially a virus infection such as chickenpox, cold sores, or herpes) -lung or breathing disease, like asthma -recent or ongoing radiation therapy -an unusual or allergic reaction to trastuzumab, benzyl alcohol, or other medications, foods, dyes, or preservatives -pregnant or trying to get pregnant -breast-feeding How should I use this medicine? This drug is given as an infusion into a vein. It is administered in a hospital or clinic by a specially trained health care professional. Talk to your pediatrician regarding the use of this medicine in children. This medicine is not approved for use in children. Overdosage: If you think you have taken too much of this medicine contact a poison control center or emergency room at once. NOTE: This medicine is only for you. Do not share this medicine with others. What if I miss a dose? It is important not to miss a dose. Call your doctor or health care professional if you are unable to keep an  appointment. What may interact with this medicine? -cyclophosphamide -doxorubicin -warfarin This list may not describe all possible interactions. Give your health care provider a list of all the medicines, herbs, non-prescription drugs, or dietary supplements you use. Also tell them if you smoke, drink  alcohol, or use illegal drugs. Some items may interact with your medicine. What should I watch for while using this medicine? Visit your doctor for checks on your progress. Report any side effects. Continue your course of treatment even though you feel ill unless your doctor tells you to stop. Call your doctor or health care professional for advice if you get a fever, chills or sore throat, or other symptoms of a cold or flu. Do not treat yourself. Try to avoid being around people who are sick. You may experience fever, chills and shaking during your first infusion. These effects are usually mild and can be treated with other medicines. Report any side effects during the infusion to your health care professional. Fever and chills usually do not happen with later infusions. What side effects may I notice from receiving this medicine? Side effects that you should report to your doctor or other health care professional as soon as possible: -breathing difficulties -chest pain or palpitations -cough -dizziness or fainting -fever or chills, sore throat -skin rash, itching or hives -swelling of the legs or ankles -unusually weak or tired Side effects that usually do not require medical attention (report to your doctor or other health care professional if they continue or are bothersome): -loss of appetite -headache -muscle aches -nausea This list may not describe all possible side effects. Call your doctor for medical advice about side effects. You may report side effects to FDA at 1-800-FDA-1088. Where should I keep my medicine? This drug is given in a hospital or clinic and will not be stored at home. NOTE: This sheet is a summary. It may not cover all possible information. If you have questions about this medicine, talk to your doctor, pharmacist, or health care provider.  2014, Elsevier/Gold Standard. (2009-07-25 13:43:15)

## 2014-02-27 NOTE — Progress Notes (Signed)
Hematology and Oncology Follow Up Visit  Kim [name] [id number] 1934-10-15 79 y.o. 02/27/2014   Principle Diagnosis:  Stage IIA (T1N1M0) adenocarcinoma of the left breast-triple positive  Current Therapy:    Status post cycle 1 of Abraxane/Herceptin     Interim History:  [name] is back for followup. She had problems with some nausea vomiting diarrhea after the day 8 treatment of Abraxane. She thinks the dose is might be too much for her. She is considering not do any more therapy. I told her that we can always cut back her dose will do more. I certainly can understand what she is talking about.  She had 4 positive lymph nodes. As such, trying to give her chemotherapy I think would be important.  She and her family and are planning [place] ocean this [place]. I think we'll have to make some adjustments to her schedule to accommodate this.  She's had no issues with respect to her heart function. Her echocardiogram back in April showed an ejection fraction of 65-70%.  She's had no bleeding. [name] had no fever. She's had no rash.  Medications: Current outpatient prescriptions:acetaminophen (TYLENOL) 650 MG CR tablet, Take 650 mg by mouth every 8 (eight) hours as needed., Disp: , Rfl: ;  Ascorbic Acid (VITAMIN C) 1000 MG tablet, Take 1,000 mg by mouth daily., Disp: , Rfl: ;  aspirin EC 81 MG EC tablet, Take 1 tablet (81 mg total) by mouth daily., Disp: , Rfl: ;  atorvastatin (LIPITOR) 10 MG tablet, Take 1 tablet (10 mg total) by mouth daily at 6 PM., Disp: 30 tablet, Rfl: 6 Calcium Citrate-Vitamin D (CITRACAL MAXIMUM PO), Take 1 tablet by mouth daily. , Disp: , Rfl: ;  cetirizine (ZYRTEC) 10 MG tablet, Take 10 mg by mouth daily as needed for allergies., Disp: , Rfl: ;  clopidogrel (PLAVIX) 75 MG tablet, Take 1 tablet (75 mg total) by mouth daily with breakfast., Disp: 30 tablet, Rfl: 6;  Cyanocobalamin (VITAMIN B-12) 2500 MCG SUBL, Place under the tongue as needed.,  Disp: , Rfl:  dexamethasone (DECADRON) 4 MG tablet, Take 8 mg by mouth as directed. 2 tabs in AM and 2 in PM day before chemo & 2 in Am and 2 in PM for 3 days after chemo., Disp: , Rfl: ;  HYDROcodone-acetaminophen (NORCO) 5-325 MG per tablet, Take 1-2 tablets by mouth every 6 (six) hours as needed., Disp: 30 tablet, Rfl: 0;  levothyroxine (SYNTHROID, LEVOTHROID) 125 MCG tablet, Take 125 mcg by mouth daily before breakfast., Disp: , Rfl:  lidocaine-prilocaine (EMLA) cream, Apply topically as needed., Disp: 30 g, Rfl: 6;  Loperamide HCl (IMODIUM PO), Take by mouth as needed., Disp: , Rfl: ;  metoprolol succinate (TOPROL-XL) 50 MG 24 hr tablet, Take 1 & 1/2 tabs daily, Disp: 45 tablet, Rfl: 6;  Multiple Vitamin (MULTIVITAMIN) tablet, Take 1 tablet by mouth daily.  , Disp: , Rfl: ;  Multiple Vitamins-Minerals (CENTRUM ADULTS PO), Take by mouth., Disp: , Rfl:  nitroGLYCERIN (NITROSTAT) 0.4 MG SL tablet, Place 1 tablet (0.4 mg total) under the tongue every 5 (five) minutes x 3 doses as needed for chest pain., Disp: 25 tablet, Rfl: 3;  oxyCODONE-acetaminophen (PERCOCET/ROXICET) 5-325 MG per tablet, Take 1 tablet by mouth every 6 (six) hours as needed for severe pain., Disp: 30 tablet, Rfl:  pantoprazole (PROTONIX) 40 MG tablet, Take 1 tablet (40 mg total) by mouth daily at 6 (six) AM., Disp: 30 tablet, Rfl: 6;  pegfilgrastim (NEULASTA) 6 MG/0.6ML injection, Inject 6 mg into the skin once., Disp: , Rfl: ;  prochlorperazine (COMPAZINE) 10 MG tablet, Take 10 mg by mouth every 6 (six) hours as needed for nausea or vomiting., Disp: , Rfl: ;  benazepril (LOTENSIN) 10 MG tablet, , Disp: , Rfl:  ondansetron (ZOFRAN) 8 MG tablet, Take 8 mg by mouth every 8 (eight) hours as needed for nausea or vomiting., Disp: , Rfl: ;  [DISCONTINUED] ferrous sulfate 325 (65 FE) MG tablet, Take 325 mg by mouth 2 (two) times daily.  , Disp: , Rfl:   Allergies:  Allergies  Allergen Reactions  . Propoxyphene N-Acetaminophen Swelling     tongue swelling  . Penicillins Swelling    Facial swelling  . Cortisone Rash  . Pseudoephedrine Hcl Er Rash and Other (See Comments)    Face gets red    Past Medical History, Surgical history, Social history, and Family History were reviewed and updated.  Review of Systems: As above   Physical Exam:  height is 5\' 8"  (1.727 m) and weight is 172 lb (78.019 kg). Her oral temperature is 98.1 F (36.7 C). Her blood pressure is 115/59 and her pulse is 69. Her respiration is 14.   Well-developed and well-nourished white female. Lungs are clear. Cardiac exam regular in rhythm. She has a 3/6  systolic ejection murmur. Abdomen is soft. Has good bowel sounds. There is no palpable liver or spleen tip. Breast exam shows a well-healed lumpectomy of the left breast at the 3:00 position. Right breast is unremarkable. Extremities shows no clubbing cyanosis or edema. Skin exam no rashes. Neurological exam is nonfocal.  Lab Results  Component Value Date   WBC 10.4* 02/26/2014   HGB 9.4* 02/26/2014   HCT 28.9* 02/26/2014   MCV 98 02/26/2014   PLT 222 02/26/2014     Chemistry      Component Value Date/Time   NA 139 02/26/2014 0815   NA 137 01/28/2014 1354   NA 136* 12/20/2013 0154   K 3.4 02/26/2014 0815   K 4.3 01/28/2014 1354   K 4.5 12/20/2013 0154   CL 100 02/26/2014 0815   CL 100 12/20/2013 0154   CO2 27 02/26/2014 0815   CO2 21* 01/28/2014 1354   CO2 23 12/20/2013 0154   BUN 18 02/26/2014 0815   BUN 35.3* 01/28/2014 1354   BUN 31* 12/20/2013 0154   CREATININE 0.9 02/26/2014 0815   CREATININE 1.3* 01/28/2014 1354   CREATININE 0.84 12/20/2013 0154      Component Value Date/Time   CALCIUM 9.2 02/26/2014 0815   CALCIUM 9.4 01/28/2014 1354   CALCIUM 8.6 12/20/2013 0154   ALKPHOS 43 02/26/2014 0815   ALKPHOS 63 01/28/2014 1354   ALKPHOS 59 12/19/2013 1140   AST 16 02/26/2014 0815   AST 10 01/28/2014 1354   AST 17 12/19/2013 1140   ALT 23 02/26/2014 0815   ALT 13 01/28/2014 1354   ALT 36* 12/19/2013 1140    BILITOT 0.70 02/26/2014 0815   BILITOT 0.76 01/28/2014 1354   BILITOT 1.0 12/19/2013 1140         Impression and Plan: Kim [name] is a 79 year old white female. She has stage IIa adenocarcinoma of the left breast. She has for possible dose. She is triple positive.  We will go ahead with her next cycle of Abraxane. Again, she wants go to the ocean next week. [place] for 2 weeks. She will be back in one week. She will be gone for another  2 weeks. I think that we can certainly arrange her schedule to accommodate her medications.  She will need radiation therapy. We will try to go through chemotherapy first.  I'll plan to get her back to see [place] in another month.   [name], MD 5/27/20156:25 AM

## 2014-02-28 ENCOUNTER — Telehealth: Payer: Self-pay | Admitting: *Deleted

## 2014-02-28 ENCOUNTER — Other Ambulatory Visit: Payer: Self-pay | Admitting: *Deleted

## 2014-02-28 MED ORDER — FUROSEMIDE 20 MG PO TABS: 20.0000 mg | ORAL_TABLET | Freq: Every day | ORAL | Status: DC

## 2014-02-28 NOTE — Telephone Encounter (Signed)
Patient called stating that her legs are swelling more and would like to have fluid pill. Dr. Marin Olp ordered Lasix 20 mg every day x 3 days then as needed

## 2014-03-04 ENCOUNTER — Ambulatory Visit: Payer: Medicare Other

## 2014-03-04 ENCOUNTER — Other Ambulatory Visit: Payer: Medicare Other | Admitting: Lab

## 2014-03-18 ENCOUNTER — Other Ambulatory Visit (HOSPITAL_BASED_OUTPATIENT_CLINIC_OR_DEPARTMENT_OTHER): Payer: Medicare Other | Admitting: Lab

## 2014-03-18 ENCOUNTER — Ambulatory Visit (HOSPITAL_BASED_OUTPATIENT_CLINIC_OR_DEPARTMENT_OTHER): Payer: Medicare Other

## 2014-03-18 ENCOUNTER — Ambulatory Visit: Payer: Medicare Other | Admitting: Hematology & Oncology

## 2014-03-18 VITALS — BP 129/70 | HR 66 | Temp 98.8°F

## 2014-03-18 DIAGNOSIS — C50919 Malignant neoplasm of unspecified site of unspecified female breast: Secondary | ICD-10-CM

## 2014-03-18 DIAGNOSIS — C50412 Malignant neoplasm of upper-outer quadrant of left female breast: Secondary | ICD-10-CM

## 2014-03-18 DIAGNOSIS — C50419 Malignant neoplasm of upper-outer quadrant of unspecified female breast: Secondary | ICD-10-CM

## 2014-03-18 DIAGNOSIS — Z5112 Encounter for antineoplastic immunotherapy: Secondary | ICD-10-CM

## 2014-03-18 DIAGNOSIS — Z5111 Encounter for antineoplastic chemotherapy: Secondary | ICD-10-CM

## 2014-03-18 LAB — CBC WITH DIFFERENTIAL (CANCER CENTER ONLY)
BASO#: 0 10*3/uL (ref 0.0–0.2)
BASO%: 0.3 % (ref 0.0–2.0)
EOS%: 0 % (ref 0.0–7.0)
Eosinophils Absolute: 0 10*3/uL (ref 0.0–0.5)
HCT: 30.2 % — ABNORMAL LOW (ref 34.8–46.6)
HGB: 10.1 g/dL — ABNORMAL LOW (ref 11.6–15.9)
LYMPH#: 0.5 10*3/uL — ABNORMAL LOW (ref 0.9–3.3)
LYMPH%: 6.7 % — ABNORMAL LOW (ref 14.0–48.0)
MCH: 32.3 pg (ref 26.0–34.0)
MCHC: 33.4 g/dL (ref 32.0–36.0)
MCV: 97 fL (ref 81–101)
MONO#: 0.5 10*3/uL (ref 0.1–0.9)
MONO%: 6 % (ref 0.0–13.0)
NEUT#: 6.5 10*3/uL (ref 1.5–6.5)
NEUT%: 87 % — ABNORMAL HIGH (ref 39.6–80.0)
Platelets: 138 10*3/uL — ABNORMAL LOW (ref 145–400)
RBC: 3.13 10*6/uL — ABNORMAL LOW (ref 3.70–5.32)
RDW: 14.9 % (ref 11.1–15.7)
WBC: 7.5 10*3/uL (ref 3.9–10.0)

## 2014-03-18 LAB — CMP (CANCER CENTER ONLY)
ALT(SGPT): 14 U/L (ref 10–47)
AST: 17 U/L (ref 11–38)
Albumin: 3.6 g/dL (ref 3.3–5.5)
Alkaline Phosphatase: 42 U/L (ref 26–84)
BUN, Bld: 21 mg/dL (ref 7–22)
CO2: 26 mEq/L (ref 18–33)
Calcium: 9.4 mg/dL (ref 8.0–10.3)
Chloride: 102 mEq/L (ref 98–108)
Creat: 1 mg/dl (ref 0.6–1.2)
Glucose, Bld: 126 mg/dL — ABNORMAL HIGH (ref 73–118)
Potassium: 3.7 mEq/L (ref 3.3–4.7)
Sodium: 139 mEq/L (ref 128–145)
Total Bilirubin: 0.8 mg/dl (ref 0.20–1.60)
Total Protein: 6.3 g/dL — ABNORMAL LOW (ref 6.4–8.1)

## 2014-03-18 MED ORDER — HEPARIN SOD (PORK) LOCK FLUSH 100 UNIT/ML IV SOLN
500.0000 [IU] | Freq: Once | INTRAVENOUS | Status: DC | PRN
  Filled 2014-03-18: qty 5

## 2014-03-18 MED ORDER — PACLITAXEL PROTEIN-BOUND CHEMO INJECTION 100 MG
67.5000 mg/m2 | Freq: Once | INTRAVENOUS | Status: AC
  Administered 2014-03-18: 125 mg via INTRAVENOUS
  Filled 2014-03-18: qty 25

## 2014-03-18 MED ORDER — DEXAMETHASONE SODIUM PHOSPHATE 10 MG/ML IJ SOLN
10.0000 mg | Freq: Once | INTRAMUSCULAR | Status: AC
  Administered 2014-03-18: 10 mg via INTRAVENOUS

## 2014-03-18 MED ORDER — HEPARIN SOD (PORK) LOCK FLUSH 100 UNIT/ML IV SOLN
250.0000 [IU] | Freq: Once | INTRAVENOUS | Status: DC | PRN
  Filled 2014-03-18: qty 5

## 2014-03-18 MED ORDER — ONDANSETRON 8 MG/50ML IVPB (CHCC)
8.0000 mg | Freq: Once | INTRAVENOUS | Status: AC
  Administered 2014-03-18: 8 mg via INTRAVENOUS

## 2014-03-18 MED ORDER — SODIUM CHLORIDE 0.9 % IJ SOLN
10.0000 mL | INTRAMUSCULAR | Status: DC | PRN
  Filled 2014-03-18: qty 10

## 2014-03-18 MED ORDER — SODIUM CHLORIDE 0.9 % IV SOLN
Freq: Once | INTRAVENOUS | Status: AC
  Administered 2014-03-18: 12:00:00 via INTRAVENOUS

## 2014-03-18 MED ORDER — DEXAMETHASONE SODIUM PHOSPHATE 10 MG/ML IJ SOLN
INTRAMUSCULAR | Status: AC
  Filled 2014-03-18: qty 1

## 2014-03-18 MED ORDER — SODIUM CHLORIDE 0.9 % IV SOLN
6.0000 mg/kg | Freq: Once | INTRAVENOUS | Status: AC
  Administered 2014-03-18: 462 mg via INTRAVENOUS
  Filled 2014-03-18: qty 22

## 2014-03-18 MED ORDER — DIPHENHYDRAMINE HCL 25 MG PO CAPS
ORAL_CAPSULE | ORAL | Status: AC
  Filled 2014-03-18: qty 2

## 2014-03-18 MED ORDER — ALTEPLASE 2 MG IJ SOLR
2.0000 mg | Freq: Once | INTRAMUSCULAR | Status: DC | PRN
  Filled 2014-03-18: qty 2

## 2014-03-18 MED ORDER — ACETAMINOPHEN 325 MG PO TABS
ORAL_TABLET | ORAL | Status: AC
  Filled 2014-03-18: qty 2

## 2014-03-18 MED ORDER — DIPHENHYDRAMINE HCL 25 MG PO CAPS
50.0000 mg | ORAL_CAPSULE | Freq: Once | ORAL | Status: AC
  Administered 2014-03-18: 50 mg via ORAL

## 2014-03-18 MED ORDER — SODIUM CHLORIDE 0.9 % IJ SOLN
3.0000 mL | INTRAMUSCULAR | Status: DC | PRN
  Filled 2014-03-18: qty 10

## 2014-03-18 MED ORDER — ACETAMINOPHEN 325 MG PO TABS
650.0000 mg | ORAL_TABLET | Freq: Once | ORAL | Status: AC
  Administered 2014-03-18: 650 mg via ORAL

## 2014-03-18 NOTE — Patient Instructions (Signed)
Nanoparticle Albumin-Bound Paclitaxel injection What is this medicine? NANOPARTICLE ALBUMIN-BOUND PACLITAXEL (Na no PAHR ti kuhl al BYOO muhn-bound PAK li TAX el) is a chemotherapy drug. It targets fast dividing cells, like cancer cells, and causes these cells to die. This medicine is used to treat advanced breast cancer and advanced lung cancer. This medicine may be used for other purposes; ask your health care provider or pharmacist if you have questions. COMMON BRAND NAME(S): Abraxane What should I tell my health care provider before I take this medicine? They need to know if you have any of these conditions: -kidney disease -liver disease -low blood counts, like low platelets, red blood cells, or white blood cells -recent or ongoing radiation therapy -an unusual or allergic reaction to paclitaxel, albumin, other chemotherapy, other medicines, foods, dyes, or preservatives -pregnant or trying to get pregnant -breast-feeding How should I use this medicine? This drug is given as an infusion into a vein. It is administered in a hospital or clinic by a specially trained health care professional. Talk to your pediatrician regarding the use of this medicine in children. Special care may be needed. Overdosage: If you think you have taken too much of this medicine contact a poison control center or emergency room at once. NOTE: This medicine is only for you. Do not share this medicine with others. What if I miss a dose? It is important not to miss your dose. Call your doctor or health care professional if you are unable to keep an appointment. What may interact with this medicine? -cyclosporine -diazepam -ketoconazole -medicines to increase blood counts like filgrastim, pegfilgrastim, sargramostim -other chemotherapy drugs like cisplatin, doxorubicin, epirubicin, etoposide, teniposide, vincristine -quinidine -testosterone -vaccines -verapamil Talk to your doctor or health care professional  before taking any of these medicines: -acetaminophen -aspirin -ibuprofen -ketoprofen -naproxen This list may not describe all possible interactions. Give your health care provider a list of all the medicines, herbs, non-prescription drugs, or dietary supplements you use. Also tell them if you smoke, drink alcohol, or use illegal drugs. Some items may interact with your medicine. What should I watch for while using this medicine? Your condition will be monitored carefully while you are receiving this medicine. You will need important blood work done while you are taking this medicine. This drug may make you feel generally unwell. This is not uncommon, as chemotherapy can affect healthy cells as well as cancer cells. Report any side effects. Continue your course of treatment even though you feel ill unless your doctor tells you to stop. In some cases, you may be given additional medicines to help with side effects. Follow all directions for their use. Call your doctor or health care professional for advice if you get a fever, chills or sore throat, or other symptoms of a cold or flu. Do not treat yourself. This drug decreases your body's ability to fight infections. Try to avoid being around people who are sick. This medicine may increase your risk to bruise or bleed. Call your doctor or health care professional if you notice any unusual bleeding. Be careful brushing and flossing your teeth or using a toothpick because you may get an infection or bleed more easily. If you have any dental work done, tell your dentist you are receiving this medicine. Avoid taking products that contain aspirin, acetaminophen, ibuprofen, naproxen, or ketoprofen unless instructed by your doctor. These medicines may hide a fever. Do not become pregnant while taking this medicine. Women should inform their doctor if they wish  to become pregnant or think they might be pregnant. There is a potential for serious side effects to  an unborn child. Talk to your health care professional or pharmacist for more information. Do not breast-feed an infant while taking this medicine. Men are advised not to father a child while receiving this medicine. What side effects may I notice from receiving this medicine? Side effects that you should report to your doctor or health care professional as soon as possible: -allergic reactions like skin rash, itching or hives, swelling of the face, lips, or tongue -low blood counts - This drug may decrease the number of white blood cells, red blood cells and platelets. You may be at increased risk for infections and bleeding. -signs of infection - fever or chills, cough, sore throat, pain or difficulty passing urine -signs of decreased platelets or bleeding - bruising, pinpoint red spots on the skin, black, tarry stools, nosebleeds -signs of decreased red blood cells - unusually weak or tired, fainting spells, lightheadedness -breathing problems -changes in vision -chest pain -high or low blood pressure -mouth sores -nausea and vomiting -pain, swelling, redness or irritation at the injection site -pain, tingling, numbness in the hands or feet -slow or irregular heartbeat -swelling of the ankle, feet, hands Side effects that usually do not require medical attention (report to your doctor or health care professional if they continue or are bothersome): -aches, pains -changes in the color of fingernails -diarrhea -hair loss -loss of appetite This list may not describe all possible side effects. Call your doctor for medical advice about side effects. You may report side effects to FDA at 1-800-FDA-1088. Where should I keep my medicine? This drug is given in a hospital or clinic and will not be stored at home. NOTE: This sheet is a summary. It may not cover all possible information. If you have questions about this medicine, talk to your doctor, pharmacist, or health care provider.  2014,  [employer]vier/Gold Standard. (2012-11-13 16:48:50) Trastuzumab injection for infusion What is this medicine? TRASTUZUMAB (tras TOO zoo mab) is a monoclonal antibody. It targets a protein called HER2. This protein is found in some stomach and breast cancers. This medicine can stop cancer cell growth. This medicine may be used with other cancer treatments. This medicine may be used for other purposes; ask your health care provider or pharmacist if you have questions. COMMON BRAND NAME(S): Herceptin What should I tell my health care provider before I take this medicine? They need to know if you have any of these conditions: -heart disease -heart failure -infection (especially a virus infection such as chickenpox, cold sores, or herpes) -lung or breathing disease, like asthma -recent or ongoing radiation therapy -an unusual or allergic reaction to trastuzumab, benzyl alcohol, or other medications, foods, dyes, or preservatives -pregnant or trying to get pregnant -breast-feeding How should I use this medicine? This drug is given as an infusion into a vein. It is administered in a hospital or clinic by a specially trained health care professional. Talk to your pediatrician regarding the use of this medicine in children. This medicine is not approved for use in children. Overdosage: If you think you have taken too much of this medicine contact a poison control center or emergency room at once. NOTE: This medicine is only for you. Do not share this medicine with others. What if I miss a dose? It is important not to miss a dose. Call your doctor or health care professional if you are unable to keep an  appointment. What may interact with this medicine? -cyclophosphamide -doxorubicin -warfarin This list may not describe all possible interactions. Give your health care provider a list of all the medicines, herbs, non-prescription drugs, or dietary supplements you use. Also tell them if you smoke, drink  alcohol, or use illegal drugs. Some items may interact with your medicine. What should I watch for while using this medicine? Visit your doctor for checks on your progress. Report any side effects. Continue your course of treatment even though you feel ill unless your doctor tells you to stop. Call your doctor or health care professional for advice if you get a fever, chills or sore throat, or other symptoms of a cold or flu. Do not treat yourself. Try to avoid being around people who are sick. You may experience fever, chills and shaking during your first infusion. These effects are usually mild and can be treated with other medicines. Report any side effects during the infusion to your health care professional. Fever and chills usually do not happen with later infusions. What side effects may I notice from receiving this medicine? Side effects that you should report to your doctor or other health care professional as soon as possible: -breathing difficulties -chest pain or palpitations -cough -dizziness or fainting -fever or chills, sore throat -skin rash, itching or hives -swelling of the legs or ankles -unusually weak or tired Side effects that usually do not require medical attention (report to your doctor or other health care professional if they continue or are bothersome): -loss of appetite -headache -muscle aches -nausea This list may not describe all possible side effects. Call your doctor for medical advice about side effects. You may report side effects to FDA at 1-800-FDA-1088. Where should I keep my medicine? This drug is given in a hospital or clinic and will not be stored at home. NOTE: This sheet is a summary. It may not cover all possible information. If you have questions about this medicine, talk to your doctor, pharmacist, or health care provider.  2014, [employer]vier/Gold Standard. (2009-07-25 13:43:15) Trastuzumab injection for infusion What is this medicine? TRASTUZUMAB  (tras TOO zoo mab) is a monoclonal antibody. It targets a protein called HER2. This protein is found in some stomach and breast cancers. This medicine can stop cancer cell growth. This medicine may be used with other cancer treatments. This medicine may be used for other purposes; ask your health care provider or pharmacist if you have questions. COMMON BRAND NAME(S): Herceptin What should I tell my health care provider before I take this medicine? They need to know if you have any of these conditions: -heart disease -heart failure -infection (especially a virus infection such as chickenpox, cold sores, or herpes) -lung or breathing disease, like asthma -recent or ongoing radiation therapy -an unusual or allergic reaction to trastuzumab, benzyl alcohol, or other medications, foods, dyes, or preservatives -pregnant or trying to get pregnant -breast-feeding How should I use this medicine? This drug is given as an infusion into a vein. It is administered in a hospital or clinic by a specially trained health care professional. Talk to your pediatrician regarding the use of this medicine in children. This medicine is not approved for use in children. Overdosage: If you think you have taken too much of this medicine contact a poison control center or emergency room at once. NOTE: This medicine is only for you. Do not share this medicine with others. What if I miss a dose? It is important not to miss a  dose. Call your doctor or health care professional if you are unable to keep an appointment. What may interact with this medicine? -cyclophosphamide -doxorubicin -warfarin This list may not describe all possible interactions. Give your health care provider a list of all the medicines, herbs, non-prescription drugs, or dietary supplements you use. Also tell them if you smoke, drink alcohol, or use illegal drugs. Some items may interact with your medicine. What should I watch for while using this  medicine? Visit your doctor for checks on your progress. Report any side effects. Continue your course of treatment even though you feel ill unless your doctor tells you to stop. Call your doctor or health care professional for advice if you get a fever, chills or sore throat, or other symptoms of a cold or flu. Do not treat yourself. Try to avoid being around people who are sick. You may experience fever, chills and shaking during your first infusion. These effects are usually mild and can be treated with other medicines. Report any side effects during the infusion to your health care professional. Fever and chills usually do not happen with later infusions. What side effects may I notice from receiving this medicine? Side effects that you should report to your doctor or other health care professional as soon as possible: -breathing difficulties -chest pain or palpitations -cough -dizziness or fainting -fever or chills, sore throat -skin rash, itching or hives -swelling of the legs or ankles -unusually weak or tired Side effects that usually do not require medical attention (report to your doctor or other health care professional if they continue or are bothersome): -loss of appetite -headache -muscle aches -nausea This list may not describe all possible side effects. Call your doctor for medical advice about side effects. You may report side effects to FDA at 1-800-FDA-1088. Where should I keep my medicine? This drug is given in a hospital or clinic and will not be stored at home. NOTE: This sheet is a summary. It may not cover all possible information. If you have questions about this medicine, talk to your doctor, pharmacist, or health care provider.  2014, [employer]vier/Gold Standard. (2009-07-25 13:43:15)

## 2014-03-19 ENCOUNTER — Ambulatory Visit: Payer: Medicare Other | Admitting: Hematology & Oncology

## 2014-03-25 ENCOUNTER — Other Ambulatory Visit: Payer: Medicare Other | Admitting: Lab

## 2014-03-25 ENCOUNTER — Ambulatory Visit: Payer: Medicare Other | Admitting: Hematology & Oncology

## 2014-03-25 ENCOUNTER — Ambulatory Visit: Payer: Medicare Other

## 2014-04-08 ENCOUNTER — Ambulatory Visit (HOSPITAL_BASED_OUTPATIENT_CLINIC_OR_DEPARTMENT_OTHER): Payer: Medicare Other

## 2014-04-08 ENCOUNTER — Ambulatory Visit (HOSPITAL_BASED_OUTPATIENT_CLINIC_OR_DEPARTMENT_OTHER): Payer: Medicare Other | Admitting: Hematology & Oncology

## 2014-04-08 ENCOUNTER — Encounter: Payer: Self-pay | Admitting: Hematology & Oncology

## 2014-04-08 ENCOUNTER — Other Ambulatory Visit (HOSPITAL_BASED_OUTPATIENT_CLINIC_OR_DEPARTMENT_OTHER): Payer: Medicare Other | Admitting: Lab

## 2014-04-08 VITALS — BP 137/83 | HR 71 | Temp 97.0°F | Resp 18 | Ht 68.0 in | Wt 168.8 lb

## 2014-04-08 DIAGNOSIS — C50419 Malignant neoplasm of upper-outer quadrant of unspecified female breast: Secondary | ICD-10-CM

## 2014-04-08 DIAGNOSIS — C50412 Malignant neoplasm of upper-outer quadrant of left female breast: Secondary | ICD-10-CM

## 2014-04-08 DIAGNOSIS — C50919 Malignant neoplasm of unspecified site of unspecified female breast: Secondary | ICD-10-CM

## 2014-04-08 DIAGNOSIS — C50912 Malignant neoplasm of unspecified site of left female breast: Secondary | ICD-10-CM

## 2014-04-08 DIAGNOSIS — Z5111 Encounter for antineoplastic chemotherapy: Secondary | ICD-10-CM

## 2014-04-08 DIAGNOSIS — Z5112 Encounter for antineoplastic immunotherapy: Secondary | ICD-10-CM

## 2014-04-08 DIAGNOSIS — Z170 Estrogen receptor positive status [ER+]: Secondary | ICD-10-CM

## 2014-04-08 LAB — CMP (CANCER CENTER ONLY)
ALT(SGPT): 11 U/L (ref 10–47)
AST: 15 U/L (ref 11–38)
Albumin: 3.8 g/dL (ref 3.3–5.5)
Alkaline Phosphatase: 55 U/L (ref 26–84)
BUN, Bld: 23 mg/dL — ABNORMAL HIGH (ref 7–22)
CO2: 26 mEq/L (ref 18–33)
Calcium: 9.3 mg/dL (ref 8.0–10.3)
Chloride: 103 mEq/L (ref 98–108)
Creat: 0.6 mg/dl (ref 0.6–1.2)
Glucose, Bld: 131 mg/dL — ABNORMAL HIGH (ref 73–118)
Potassium: 3.8 mEq/L (ref 3.3–4.7)
Sodium: 138 mEq/L (ref 128–145)
Total Bilirubin: 0.9 mg/dl (ref 0.20–1.60)
Total Protein: 6.7 g/dL (ref 6.4–8.1)

## 2014-04-08 LAB — CBC WITH DIFFERENTIAL (CANCER CENTER ONLY)
BASO#: 0 10*3/uL (ref 0.0–0.2)
BASO%: 0.2 % (ref 0.0–2.0)
EOS%: 0 % (ref 0.0–7.0)
Eosinophils Absolute: 0 10*3/uL (ref 0.0–0.5)
HCT: 32.1 % — ABNORMAL LOW (ref 34.8–46.6)
HGB: 10.9 g/dL — ABNORMAL LOW (ref 11.6–15.9)
LYMPH#: 0.4 10*3/uL — ABNORMAL LOW (ref 0.9–3.3)
LYMPH%: 6.2 % — ABNORMAL LOW (ref 14.0–48.0)
MCH: 31.6 pg (ref 26.0–34.0)
MCHC: 34 g/dL (ref 32.0–36.0)
MCV: 93 fL (ref 81–101)
MONO#: 0.1 10*3/uL (ref 0.1–0.9)
MONO%: 1.8 % (ref 0.0–13.0)
NEUT#: 6 10*3/uL (ref 1.5–6.5)
NEUT%: 91.8 % — ABNORMAL HIGH (ref 39.6–80.0)
Platelets: 155 10*3/uL (ref 145–400)
RBC: 3.45 10*6/uL — ABNORMAL LOW (ref 3.70–5.32)
RDW: 14.4 % (ref 11.1–15.7)
WBC: 6.6 10*3/uL (ref 3.9–10.0)

## 2014-04-08 MED ORDER — DIPHENHYDRAMINE HCL 25 MG PO CAPS
ORAL_CAPSULE | ORAL | Status: AC
  Filled 2014-04-08: qty 2

## 2014-04-08 MED ORDER — DEXAMETHASONE SODIUM PHOSPHATE 10 MG/ML IJ SOLN
10.0000 mg | Freq: Once | INTRAMUSCULAR | Status: AC
  Administered 2014-04-08: 10 mg via INTRAVENOUS

## 2014-04-08 MED ORDER — TRASTUZUMAB CHEMO INJECTION 440 MG
6.0000 mg/kg | Freq: Once | INTRAVENOUS | Status: AC
  Administered 2014-04-08: 462 mg via INTRAVENOUS
  Filled 2014-04-08: qty 22

## 2014-04-08 MED ORDER — DEXAMETHASONE SODIUM PHOSPHATE 10 MG/ML IJ SOLN
INTRAMUSCULAR | Status: AC
  Filled 2014-04-08: qty 1

## 2014-04-08 MED ORDER — HEPARIN SOD (PORK) LOCK FLUSH 100 UNIT/ML IV SOLN
500.0000 [IU] | Freq: Once | INTRAVENOUS | Status: AC | PRN
  Administered 2014-04-08: 500 [IU]
  Filled 2014-04-08: qty 5

## 2014-04-08 MED ORDER — SODIUM CHLORIDE 0.9 % IV SOLN
Freq: Once | INTRAVENOUS | Status: AC
  Administered 2014-04-08: 12:00:00 via INTRAVENOUS

## 2014-04-08 MED ORDER — DEXAMETHASONE 4 MG PO TABS: 8.0000 mg | ORAL_TABLET | ORAL | Status: DC

## 2014-04-08 MED ORDER — ACETAMINOPHEN 325 MG PO TABS
ORAL_TABLET | ORAL | Status: AC
  Filled 2014-04-08: qty 2

## 2014-04-08 MED ORDER — ONDANSETRON 8 MG/50ML IVPB (CHCC)
8.0000 mg | Freq: Once | INTRAVENOUS | Status: AC
  Administered 2014-04-08: 8 mg via INTRAVENOUS

## 2014-04-08 MED ORDER — ACETAMINOPHEN 325 MG PO TABS
650.0000 mg | ORAL_TABLET | Freq: Once | ORAL | Status: AC
  Administered 2014-04-08: 650 mg via ORAL

## 2014-04-08 MED ORDER — DIPHENHYDRAMINE HCL 25 MG PO CAPS
50.0000 mg | ORAL_CAPSULE | Freq: Once | ORAL | Status: AC
  Administered 2014-04-08: 50 mg via ORAL

## 2014-04-08 MED ORDER — SODIUM CHLORIDE 0.9 % IJ SOLN
10.0000 mL | INTRAMUSCULAR | Status: DC | PRN
  Administered 2014-04-08: 10 mL
  Filled 2014-04-08: qty 10

## 2014-04-08 MED ORDER — PACLITAXEL PROTEIN-BOUND CHEMO INJECTION 100 MG
67.5000 mg/m2 | Freq: Once | INTRAVENOUS | Status: AC
  Administered 2014-04-08: 125 mg via INTRAVENOUS
  Filled 2014-04-08: qty 25

## 2014-04-08 NOTE — Patient Instructions (Signed)
Nanoparticle Albumin-Bound Paclitaxel injection What is this medicine? NANOPARTICLE ALBUMIN-BOUND PACLITAXEL (Na no PAHR ti kuhl al BYOO muhn-bound PAK li TAX el) is a chemotherapy drug. It targets fast dividing cells, like cancer cells, and causes these cells to die. This medicine is used to treat advanced breast cancer and advanced lung cancer. This medicine may be used for other purposes; ask your health care provider or pharmacist if you have questions. COMMON BRAND NAME(S): Abraxane What should I tell my health care provider before I take this medicine? They need to know if you have any of these conditions: -kidney disease -liver disease -low blood counts, like low platelets, red blood cells, or white blood cells -recent or ongoing radiation therapy -an unusual or allergic reaction to paclitaxel, albumin, other chemotherapy, other medicines, foods, dyes, or preservatives -pregnant or trying to get pregnant -breast-feeding How should I use this medicine? This drug is given as an infusion into a vein. It is administered in a hospital or clinic by a specially trained health care professional. Talk to your pediatrician regarding the use of this medicine in children. Special care may be needed. Overdosage: If you think you have taken too much of this medicine contact a poison control center or emergency room at once. NOTE: This medicine is only for you. Do not share this medicine with others. What if I miss a dose? It is important not to miss your dose. Call your doctor or health care professional if you are unable to keep an appointment. What may interact with this medicine? -cyclosporine -diazepam -ketoconazole -medicines to increase blood counts like filgrastim, pegfilgrastim, sargramostim -other chemotherapy drugs like cisplatin, doxorubicin, epirubicin, etoposide, teniposide, vincristine -quinidine -testosterone -vaccines -verapamil Talk to your doctor or health care professional  before taking any of these medicines: -acetaminophen -aspirin -ibuprofen -ketoprofen -naproxen This list may not describe all possible interactions. Give your health care provider a list of all the medicines, herbs, non-prescription drugs, or dietary supplements you use. Also tell them if you smoke, drink alcohol, or use illegal drugs. Some items may interact with your medicine. What should I watch for while using this medicine? Your condition will be monitored carefully while you are receiving this medicine. You will need important blood work done while you are taking this medicine. This drug may make you feel generally unwell. This is not uncommon, as chemotherapy can affect healthy cells as well as cancer cells. Report any side effects. Continue your course of treatment even though you feel ill unless your doctor tells you to stop. In some cases, you may be given additional medicines to help with side effects. Follow all directions for their use. Call your doctor or health care professional for advice if you get a fever, chills or sore throat, or other symptoms of a cold or flu. Do not treat yourself. This drug decreases your body's ability to fight infections. Try to avoid being around people who are sick. This medicine may increase your risk to bruise or bleed. Call your doctor or health care professional if you notice any unusual bleeding. Be careful brushing and flossing your teeth or using a toothpick because you may get an infection or bleed more easily. If you have any dental work done, tell your dentist you are receiving this medicine. Avoid taking products that contain aspirin, acetaminophen, ibuprofen, naproxen, or ketoprofen unless instructed by your doctor. These medicines may hide a fever. Do not become pregnant while taking this medicine. Women should inform their doctor if they wish  to become pregnant or think they might be pregnant. There is a potential for serious side effects to  an unborn child. Talk to your health care professional or pharmacist for more information. Do not breast-feed an infant while taking this medicine. Men are advised not to father a child while receiving this medicine. What side effects may I notice from receiving this medicine? Side effects that you should report to your doctor or health care professional as soon as possible: -allergic reactions like skin rash, itching or hives, swelling of the face, lips, or tongue -low blood counts - This drug may decrease the number of white blood cells, red blood cells and platelets. You may be at increased risk for infections and bleeding. -signs of infection - fever or chills, cough, sore throat, pain or difficulty passing urine -signs of decreased platelets or bleeding - bruising, pinpoint red spots on the skin, black, tarry stools, nosebleeds -signs of decreased red blood cells - unusually weak or tired, fainting spells, lightheadedness -breathing problems -changes in vision -chest pain -high or low blood pressure -mouth sores -nausea and vomiting -pain, swelling, redness or irritation at the injection site -pain, tingling, numbness in the hands or feet -slow or irregular heartbeat -swelling of the ankle, feet, hands Side effects that usually do not require medical attention (report to your doctor or health care professional if they continue or are bothersome): -aches, pains -changes in the color of fingernails -diarrhea -hair loss -loss of appetite This list may not describe all possible side effects. Call your doctor for medical advice about side effects. You may report side effects to FDA at 1-800-FDA-1088. Where should I keep my medicine? This drug is given in a hospital or clinic and will not be stored at home. NOTE: This sheet is a summary. It may not cover all possible information. If you have questions about this medicine, talk to your doctor, pharmacist, or health care provider.  2014,  Elsevier/Gold Standard. (2012-11-13 16:48:50) Trastuzumab injection for infusion What is this medicine? TRASTUZUMAB (tras TOO zoo mab) is a monoclonal antibody. It targets a protein called HER2. This protein is found in some stomach and breast cancers. This medicine can stop cancer cell growth. This medicine may be used with other cancer treatments. This medicine may be used for other purposes; ask your health care provider or pharmacist if you have questions. COMMON BRAND NAME(S): Herceptin What should I tell my health care provider before I take this medicine? They need to know if you have any of these conditions: -heart disease -heart failure -infection (especially a virus infection such as chickenpox, cold sores, or herpes) -lung or breathing disease, like asthma -recent or ongoing radiation therapy -an unusual or allergic reaction to trastuzumab, benzyl alcohol, or other medications, foods, dyes, or preservatives -pregnant or trying to get pregnant -breast-feeding How should I use this medicine? This drug is given as an infusion into a vein. It is administered in a hospital or clinic by a specially trained health care professional. Talk to your pediatrician regarding the use of this medicine in children. This medicine is not approved for use in children. Overdosage: If you think you have taken too much of this medicine contact a poison control center or emergency room at once. NOTE: This medicine is only for you. Do not share this medicine with others. What if I miss a dose? It is important not to miss a dose. Call your doctor or health care professional if you are unable to keep an  appointment. What may interact with this medicine? -cyclophosphamide -doxorubicin -warfarin This list may not describe all possible interactions. Give your health care provider a list of all the medicines, herbs, non-prescription drugs, or dietary supplements you use. Also tell them if you smoke, drink  alcohol, or use illegal drugs. Some items may interact with your medicine. What should I watch for while using this medicine? Visit your doctor for checks on your progress. Report any side effects. Continue your course of treatment even though you feel ill unless your doctor tells you to stop. Call your doctor or health care professional for advice if you get a fever, chills or sore throat, or other symptoms of a cold or flu. Do not treat yourself. Try to avoid being around people who are sick. You may experience fever, chills and shaking during your first infusion. These effects are usually mild and can be treated with other medicines. Report any side effects during the infusion to your health care professional. Fever and chills usually do not happen with later infusions. What side effects may I notice from receiving this medicine? Side effects that you should report to your doctor or other health care professional as soon as possible: -breathing difficulties -chest pain or palpitations -cough -dizziness or fainting -fever or chills, sore throat -skin rash, itching or hives -swelling of the legs or ankles -unusually weak or tired Side effects that usually do not require medical attention (report to your doctor or other health care professional if they continue or are bothersome): -loss of appetite -headache -muscle aches -nausea This list may not describe all possible side effects. Call your doctor for medical advice about side effects. You may report side effects to FDA at 1-800-FDA-1088. Where should I keep my medicine? This drug is given in a hospital or clinic and will not be stored at home. NOTE: This sheet is a summary. It may not cover all possible information. If you have questions about this medicine, talk to your doctor, pharmacist, or health care provider.  2014, Elsevier/Gold Standard. (2009-07-25 13:43:15)

## 2014-04-09 NOTE — Progress Notes (Signed)
Hematology and Oncology Follow Up Visit  Kim [name] [id number] [birth year] 79 y.o. 04/09/2014   Principle Diagnosis:  Stage IIA (T1N1M0) adenocarcinoma of the left breast-triple positive  Current Therapy:    Status post cycle 3 of Abraxane/Herceptin     Interim History:  Kim [name] is back for followup. She has had not as many problems  with some nausea or vomiting. There is still some diarrhea after the day 8 treatment of Abraxane. Her appetite has been okay. She has had no pain. There's been no leg swelling. She has had no tingling in the hands or feet.   She had 4 positive lymph nodes. As such, trying to give her chemotherapy I think would be important.  She was able to go on her family vacation. She's been to the beach. She will be done back in a week or so. She has been able to enjoy this. She's had no issues with respect to her heart function. Her echocardiogram back in April showed an ejection fraction of 65-70%. We will repeat this.  She's had no bleeding. [name] had no fever. She's had no rash.  Medications: Current outpatient prescriptions:acetaminophen (TYLENOL) 650 MG CR tablet, Take 650 mg by mouth every 8 (eight) hours as needed., Disp: , Rfl: ;  acidophilus (RISAQUAD) CAPS capsule, Take 1 capsule by mouth daily., Disp: , Rfl: ;  Ascorbic Acid (VITAMIN C) 1000 MG tablet, Take 1,000 mg by mouth daily., Disp: , Rfl: ;  aspirin EC 81 MG EC tablet, Take 1 tablet (81 mg total) by mouth daily., Disp: , Rfl:  atorvastatin (LIPITOR) 10 MG tablet, Take 1 tablet (10 mg total) by mouth daily at 6 PM., Disp: 30 tablet, Rfl: 6;  benazepril (LOTENSIN) 10 MG tablet, Take 10 mg by mouth daily. , Disp: , Rfl: ;  Calcium Citrate-Vitamin D (CITRACAL MAXIMUM PO), Take 1 tablet by mouth daily. , Disp: , Rfl: ;  clopidogrel (PLAVIX) 75 MG tablet, Take 1 tablet (75 mg total) by mouth daily with breakfast., Disp: 30 tablet, Rfl: 6 Cyanocobalamin (VITAMIN B-12) 2500 MCG SUBL, Place under the tongue as  needed., Disp: , Rfl: ;  dexamethasone (DECADRON) 4 MG tablet, Take 2 tablets (8 mg total) by mouth as directed. 2 tabs in AM and 2 in PM day before chemo & 2 in Am and 2 in PM for 3 days after chemo., Disp: 30 tablet, Rfl: 5 furosemide (LASIX) 20 MG tablet, Take 1 tablet (20 mg total) by mouth daily. Take 1 tablet (20 mg total) every day x 3 days then take once daily as needed, Disp: 30 tablet, Rfl: 0;  HYDROcodone-acetaminophen (NORCO) 5-325 MG per tablet, Take 1-2 tablets by mouth every 6 (six) hours as needed., Disp: 30 tablet, Rfl: 0;  levothyroxine (SYNTHROID, LEVOTHROID) 125 MCG tablet, Take 125 mcg by mouth daily before breakfast., Disp: , Rfl:  lidocaine-prilocaine (EMLA) cream, Apply topically as needed., Disp: 30 g, Rfl: 6;  Loperamide HCl (IMODIUM PO), Take by mouth as needed., Disp: , Rfl: ;  metoprolol succinate (TOPROL-XL) 50 MG 24 hr tablet, Take 1 & 1/2 tabs daily, Disp: 45 tablet, Rfl: 6;  Multiple Vitamin (MULTIVITAMIN) tablet, Take 1 tablet by mouth daily.  , Disp: , Rfl: ;  Multiple Vitamins-Minerals (CENTRUM ADULTS PO), Take by mouth., Disp: , Rfl:  nitroGLYCERIN (NITROSTAT) 0.4 MG SL tablet, Place 1 tablet (0.4 mg total) under the tongue every 5 (five) minutes x 3 doses as needed for chest pain., Disp: 25 tablet, Rfl: 3;  ondansetron (ZOFRAN) 8 MG tablet, Take 8 mg by mouth every 8 (eight) hours as needed for nausea or vomiting., Disp: , Rfl:  oxyCODONE-acetaminophen (PERCOCET/ROXICET) 5-325 MG per tablet, Take 1 tablet by mouth every 6 (six) hours as needed for severe pain., Disp: 30 tablet, Rfl: ;  pantoprazole (PROTONIX) 40 MG tablet, Take 1 tablet (40 mg total) by mouth daily at 6 (six) AM., Disp: 30 tablet, Rfl: 6;  pegfilgrastim (NEULASTA) 6 MG/0.6ML injection, Inject 6 mg into the skin once., Disp: , Rfl:  prochlorperazine (COMPAZINE) 10 MG tablet, Take 10 mg by mouth every 6 (six) hours as needed for nausea or vomiting., Disp: , Rfl: ;  cetirizine (ZYRTEC) 10 MG tablet, Take 10 mg  by mouth daily as needed for allergies., Disp: , Rfl: ;  [DISCONTINUED] ferrous sulfate 325 (65 FE) MG tablet, Take 325 mg by mouth 2 (two) times daily.  , Disp: , Rfl:   Allergies:  Allergies  Allergen Reactions  . Propoxyphene N-Acetaminophen Swelling    tongue swelling  . Penicillins Swelling    Facial swelling  . Cortisone Rash  . Pseudoephedrine Hcl Er Rash and Other (See Comments)    Face gets red    Past Medical History, Surgical history, Social history, and Family History were reviewed and updated.  Review of Systems: As above   Physical Exam:  height is 5\' 8"  (1.727 m) and weight is 168 lb 12.8 oz (76.567 kg). Her oral temperature is 97 F (36.1 C). Her blood pressure is 137/83 and her pulse is 71. Her respiration is 18.   Well-developed and well-nourished white female. Lungs are clear. Cardiac exam regular in rhythm. She has a 3/6  systolic ejection murmur. Abdomen is soft. Has good bowel sounds. There is no palpable liver or spleen tip. Breast exam shows a well-healed lumpectomy of the left breast at the 3:00 position. Right breast is unremarkable. Extremities shows no clubbing cyanosis or edema. Skin exam no rashes. Neurological exam is nonfocal.  Lab Results  Component Value Date   WBC 6.6 04/08/2014   HGB 10.9* 04/08/2014   HCT 32.1* 04/08/2014   MCV 93 04/08/2014   PLT 155 04/08/2014     Chemistry      Component Value Date/Time   NA 138 04/08/2014 1048   NA 137 01/28/2014 1354   NA 136* 12/20/2013 0154   K 3.8 04/08/2014 1048   K 4.3 01/28/2014 1354   K 4.5 12/20/2013 0154   CL 103 04/08/2014 1048   CL 100 12/20/2013 0154   CO2 26 04/08/2014 1048   CO2 21* 01/28/2014 1354   CO2 23 12/20/2013 0154   BUN 23* 04/08/2014 1048   BUN 35.3* 01/28/2014 1354   BUN 31* 12/20/2013 0154   CREATININE 0.6 04/08/2014 1048   CREATININE 1.3* 01/28/2014 1354   CREATININE 0.84 12/20/2013 0154      Component Value Date/Time   CALCIUM 9.3 04/08/2014 1048   CALCIUM 9.4 01/28/2014 1354   CALCIUM 8.6  12/20/2013 0154   ALKPHOS 55 04/08/2014 1048   ALKPHOS 63 01/28/2014 1354   ALKPHOS 59 12/19/2013 1140   AST 15 04/08/2014 1048   AST 10 01/28/2014 1354   AST 17 12/19/2013 1140   ALT 11 04/08/2014 1048   ALT 13 01/28/2014 1354   ALT 36* 12/19/2013 1140   BILITOT 0.90 04/08/2014 1048   BILITOT 0.76 01/28/2014 1354   BILITOT 1.0 12/19/2013 1140         Impression and Plan: Kim [name]  is a 79 year old white female. She has stage IIa adenocarcinoma of the left breast. She has for possible dose. She is triple positive.  We will go ahead with her fourth and final cycle of Abraxane. Again, she wants go to the ocean next week. After that, we will plan for radiation therapy. I spoke with Dr. Sondra Come of radiation oncology. Once radiation is completed, then I will put her on Femara.  She will continue the Herceptin throughout radiation and Femara for one year.  I'll plan to get her back to see [place] in another month.  I also think that Zometa would be helpful twice a year.  I spent a good half hour with her.   [name], MD 7/7/20157:25 AM

## 2014-04-10 ENCOUNTER — Telehealth: Payer: Self-pay | Admitting: Hematology & Oncology

## 2014-04-10 NOTE — Telephone Encounter (Signed)
Per Md order to sch Echocardiogram.  I called [phone] Vascular lab and sch apt with Kristy for 04/24/14 at 11am here at the [facility] in the [facility] location.  I called the patient to give apt dates and times.  Patient's husband answer and received the message for the apt date and times.

## 2014-04-12 ENCOUNTER — Telehealth: Payer: Self-pay | Admitting: Cardiology

## 2014-04-12 NOTE — Telephone Encounter (Signed)
lmtcb

## 2014-04-12 NOTE — Telephone Encounter (Signed)
Called stating Dr. Aundra Dubin said both her parents have a heart condition that she and her brother need to be checked with an ultrasound.  She wanted to know the name and from looking at both her mother and father's chart appears to be hypertrophic cardiomyopathy. Advised she probably needs an Echo.  She will call her PCP and have him order test for her.

## 2014-04-12 NOTE — Telephone Encounter (Signed)
New problem   Pt's daughter stated Dr Aundra Dubin advised her to have a diagnostics study b/c of her mom condition. She need to know he name of her mom's heart condition and where does she need to go and have test. Please call pt.

## 2014-04-15 ENCOUNTER — Other Ambulatory Visit: Payer: Medicare Other | Admitting: Lab

## 2014-04-15 ENCOUNTER — Ambulatory Visit: Payer: Medicare Other

## 2014-04-15 NOTE — Progress Notes (Signed)
Location of Breast Cancer:Left Upper Outer Quadrant    Histology per Pathology Report:   11/08/13  Diagnosis  1. Breast, lumpectomy, Left  - DUCTAL CARCINOMA IN SITU, 1.5 CM, ASSOCIATED WITH BIOPSY SITE REACTION.  - MARGINS NOT INVOLVED BY DUCTAL CARCINOMA IN SITU.  - SCATTERED FOCI OF INTRAVASCULAR CARCINOMA.  - SEE ONCOLOGY TABLE AND COMMENT.  2. Breast, excision, Left further inferior margin  - FIBROCYSTIC CHANGES.  - NO MALIGNANCY IDENTIFIED.  3. Lymph nodes, regional resection, Left axillary contents  - METASTATIC CARCINOMA IN FOUR OF TEN LYMPH NODES (4/10).  10/31/13  Diagnosis  Lymph node, needle/core biopsy, left axilla  - ONE LYMPH NODE POSITIVE FOR METASTATIC MAMMARY CARCINOMA (1/1).  - SEE COMMENT.  10/18/13  Diagnosis  Breast, left, needle core biopsy, outer left breast  - DUCTAL CARCINOMA IN SITU WITH NECROSIS AND CALCIFICATIONS, SEE COMMENT    Receptor Status: ER(100%), PR (4%), Her2-neu (N/A), Ki-67(N/A)    Found on screening mammography?: Screening mammogram 6 months ago revealed a small area of calcifications in the upper-outer quadrant of the left breast. She was recommended a short term followup mammogram which was performed in January 2015. The mammogram revealed slight increase in the number of calcifications.  Past/Anticipated interventions by surgeon, if any: Lumpectomy of Left Breast   Past/Anticipated interventions by medical oncology, if any: ? Chemotherapy : 3 cycles previously of THC and 4 cycles of Abraxane/ Herceptin.  Toltal of 1 year of Herceptin.     Lymphedema issues, if any: NO   Pain issues, if any: NO  SAFETY ISSUES:  Prior radiation? No  Pacemaker/ICD? No   Possible current pregnancy? No  Is the patient on methotrexate? NO  Menarche age [age], parity age 53, G69,P2,  No BC, HRT x 20 years  Current Complaints / other details: Haring aids       Deirdre Evener, RN 04/15/2014,5:37 PM

## 2014-04-16 NOTE — Progress Notes (Signed)
Radiation Oncology         [phone] ________________________________  Name: Kim [name] MRN: [id number]  Date: 04/17/2014  DOB: 1934/10/18  Follow-Up Visit Note  Outpatient  CC: Laurey Morale, MD  [name], MD  Diagnosis:   TisN2Mx Stage III left ductal carcinoma with 5 of 11 positive lymph nodes    Narrative:  The patient returns today for follow-up.    Since I saw her in February, she started systemic therapy.  Of note, her PET ruled out distant metastases.  She started with [facility] but tolerated this poorly. Therefore she switched to Abraxane. She has also received Herceptin. Last cycle of Abraxane appears to be scheduled for 7-17.  She will be on Herceptin for 1 yr and eventually start an anti-estrogen pill with Dr. Marin Olp.            She reports the worst side effect from Abraxane has been chemotherapy.  She does not remember a lot from our consultation in February so we spoke a lot about the logistics of RT today.         ALLERGIES:  is allergic to propoxyphene n-acetaminophen; penicillins; cortisone; and pseudoephedrine hcl er.  Meds: Current Outpatient Prescriptions  Medication Sig Dispense Refill  . acetaminophen (TYLENOL) 650 MG CR tablet Take 650 mg by mouth every 8 (eight) hours as needed.      Marland Kitchen acidophilus (RISAQUAD) CAPS capsule Take 1 capsule by mouth daily.      . Ascorbic Acid (VITAMIN C) 1000 MG tablet Take 1,000 mg by mouth daily.      Marland Kitchen aspirin EC 81 MG EC tablet Take 1 tablet (81 mg total) by mouth daily.      Marland Kitchen atorvastatin (LIPITOR) 10 MG tablet Take 1 tablet (10 mg total) by mouth daily at 6 PM.  30 tablet  6  . Calcium Citrate-Vitamin D (CITRACAL MAXIMUM PO) Take 1 tablet by mouth daily.       . cetirizine (ZYRTEC) 10 MG tablet Take 10 mg by mouth daily as needed for allergies.      Marland Kitchen clopidogrel (PLAVIX) 75 MG tablet Take 1 tablet (75 mg total) by mouth daily with breakfast.  30 tablet  6  . Cyanocobalamin (VITAMIN B-12) 2500 MCG SUBL Place  under the tongue as needed.      Marland Kitchen dexamethasone (DECADRON) 4 MG tablet Take 2 tablets (8 mg total) by mouth as directed. 2 tabs in AM and 2 in PM day before chemo & 2 in Am and 2 in PM for 3 days after chemo.  30 tablet  5  . furosemide (LASIX) 20 MG tablet Take 1 tablet (20 mg total) by mouth daily. Take 1 tablet (20 mg total) every day x 3 days then take once daily as needed  30 tablet  0  . HYDROcodone-acetaminophen (NORCO) 5-325 MG per tablet Take 1-2 tablets by mouth every 6 (six) hours as needed.  30 tablet  0  . levothyroxine (SYNTHROID, LEVOTHROID) 125 MCG tablet Take 125 mcg by mouth daily before breakfast.      . lidocaine-prilocaine (EMLA) cream Apply topically as needed.  30 g  6  . Loperamide HCl (IMODIUM PO) Take by mouth as needed.      . Multiple Vitamin (MULTIVITAMIN) tablet Take 1 tablet by mouth daily.        . Multiple Vitamins-Minerals (CENTRUM ADULTS PO) Take by mouth.      . nitroGLYCERIN (NITROSTAT) 0.4 MG SL tablet Place 1 tablet (  0.4 mg total) under the tongue every 5 (five) minutes x 3 doses as needed for chest pain.  25 tablet  3  . ondansetron (ZOFRAN) 8 MG tablet Take 8 mg by mouth every 8 (eight) hours as needed for nausea or vomiting.      Marland Kitchen oxyCODONE-acetaminophen (PERCOCET/ROXICET) 5-325 MG per tablet Take 1 tablet by mouth every 6 (six) hours as needed for severe pain.  30 tablet    . prochlorperazine (COMPAZINE) 10 MG tablet Take 10 mg by mouth every 6 (six) hours as needed for nausea or vomiting.      . benazepril (LOTENSIN) 10 MG tablet Take 10 mg by mouth daily.       . metoprolol succinate (TOPROL-XL) 50 MG 24 hr tablet Take 1 & 1/2 tabs daily  45 tablet  6  . pantoprazole (PROTONIX) 40 MG tablet Take 1 tablet (40 mg total) by mouth daily at 6 (six) AM.  30 tablet  6  . [DISCONTINUED] ferrous sulfate 325 (65 FE) MG tablet Take 325 mg by mouth 2 (two) times daily.         No current facility-administered medications for this encounter.    Physical  Findings: The patient is in no acute distress. Patient is alert and oriented.  height is 5\' 8"  (1.727 m) and weight is 166 lb (75.297 kg). Her temperature is 98.2 F (36.8 C). Her blood pressure is 123/68 and her pulse is 79. Marland Kitchen    General: Alert and oriented, in no acute distress HEENT: Head is normocephalic. Head tremor. Alopecia. Hearing aids. Extraocular movements are intact. Oropharynx is clear. Neck: Neck is supple, no palpable cervical or supraclavicular lymphadenopathy. Heart: Regular in rate and rhythm with loud systolic murmur throughout precordium Chest: high pitched sounds bilaterally on inspiration  Abdomen: Soft, nontender, nondistended, with no rigidity or guarding. Extremities: No cyanosis or edema. Lymphatics: No concerning lymphadenopathy. Skin: No concerning lesions. Psychiatric: Judgment and insight are intact. Affect is appropriate. BREASTS: well healed lumpectomy scar in UOQ of L breast. No lesions of concern in breasts or axillary regions   Lab Findings: Lab Results  Component Value Date   WBC 6.6 04/08/2014   HGB 10.9* 04/08/2014   HCT 32.1* 04/08/2014   MCV 93 04/08/2014   PLT 155 04/08/2014   CMP     Component Value Date/Time   NA 138 04/08/2014 1048   NA 137 01/28/2014 1354   NA 136* 12/20/2013 0154   K 3.8 04/08/2014 1048   K 4.3 01/28/2014 1354   K 4.5 12/20/2013 0154   CL 103 04/08/2014 1048   CL 100 12/20/2013 0154   CO2 26 04/08/2014 1048   CO2 21* 01/28/2014 1354   CO2 23 12/20/2013 0154   GLUCOSE 131* 04/08/2014 1048   GLUCOSE 156* 01/28/2014 1354   GLUCOSE 124* 12/20/2013 0154   BUN 23* 04/08/2014 1048   BUN 35.3* 01/28/2014 1354   BUN 31* 12/20/2013 0154   CREATININE 0.6 04/08/2014 1048   CREATININE 1.3* 01/28/2014 1354   CREATININE 0.84 12/20/2013 0154   CALCIUM 9.3 04/08/2014 1048   CALCIUM 9.4 01/28/2014 1354   CALCIUM 8.6 12/20/2013 0154   PROT 6.7 04/08/2014 1048   PROT 6.7 01/28/2014 1354   PROT 6.1 12/19/2013 1140   ALBUMIN 3.7 01/28/2014 1354   ALBUMIN 3.5  12/19/2013 1140   AST 15 04/08/2014 1048   AST 10 01/28/2014 1354   AST 17 12/19/2013 1140   ALT 11 04/08/2014 1048   ALT 13 01/28/2014  1354   ALT 36* 12/19/2013 1140   ALKPHOS 55 04/08/2014 1048   ALKPHOS 63 01/28/2014 1354   ALKPHOS 59 12/19/2013 1140   BILITOT 0.90 04/08/2014 1048   BILITOT 0.76 01/28/2014 1354   BILITOT 1.0 12/19/2013 1140   GFRNONAA 65* 12/20/2013 0154   GFRAA 76* 12/20/2013 0154      Radiographic Findings: No results found.  Impression/Plan: TisN2Mx Stage III left ductal carcinoma with 5 of 11 positive lymph nodes   We discussed adjuvant radiotherapy today.  I recommend RT to the Left Breast and supraclavicular and axillary nodes in order to reduce her risk of local regional recurrence by about 2/3.  The risks, benefits and side effects of this treatment were discussed in detail.  She understands that radiotherapy is associated with skin irritation and fatigue in the acute setting. Late effects can include cosmetic changes and rare injury to internal organs.   She is enthusiastic about proceeding with treatment. A consent form has been signed and placed in her chart. Will simulate her in the first week of August, and start RT a week thereafter.    It appears that her last cycle of Abraxane is  tomorrow. Dr. Marin Olp was not in the office today, so I will send him a note to confirm this.    -----------------------------------  Eppie Gibson, MD

## 2014-04-17 ENCOUNTER — Encounter: Payer: Self-pay | Admitting: Radiation Oncology

## 2014-04-17 ENCOUNTER — Ambulatory Visit
Admission: RE | Admit: 2014-04-17 | Discharge: 2014-04-17 | Disposition: A | Discharge status: Home / Self Care | Payer: Medicare Other | Source: Ambulatory Visit | Attending: Radiation Oncology | Admitting: Radiation Oncology

## 2014-04-17 VITALS — BP 123/68 | HR 79 | Temp 98.2°F | Ht 68.0 in | Wt 166.0 lb

## 2014-04-17 DIAGNOSIS — C50419 Malignant neoplasm of upper-outer quadrant of unspecified female breast: Secondary | ICD-10-CM | POA: Insufficient documentation

## 2014-04-17 DIAGNOSIS — C50412 Malignant neoplasm of upper-outer quadrant of left female breast: Secondary | ICD-10-CM

## 2014-04-17 DIAGNOSIS — Z79899 Other long term (current) drug therapy: Secondary | ICD-10-CM | POA: Insufficient documentation

## 2014-04-17 DIAGNOSIS — Z7902 Long term (current) use of antithrombotics/antiplatelets: Secondary | ICD-10-CM | POA: Insufficient documentation

## 2014-04-17 DIAGNOSIS — Z7982 Long term (current) use of aspirin: Secondary | ICD-10-CM | POA: Insufficient documentation

## 2014-04-17 DIAGNOSIS — Z510 Encounter for antineoplastic radiation therapy: Secondary | ICD-10-CM | POA: Diagnosis not present

## 2014-04-18 ENCOUNTER — Ambulatory Visit (HOSPITAL_BASED_OUTPATIENT_CLINIC_OR_DEPARTMENT_OTHER): Payer: Medicare Other

## 2014-04-18 ENCOUNTER — Other Ambulatory Visit (HOSPITAL_BASED_OUTPATIENT_CLINIC_OR_DEPARTMENT_OTHER): Payer: Medicare Other | Admitting: Lab

## 2014-04-18 VITALS — BP 133/79 | HR 73 | Temp 96.9°F | Resp 18

## 2014-04-18 DIAGNOSIS — C50912 Malignant neoplasm of unspecified site of left female breast: Secondary | ICD-10-CM

## 2014-04-18 DIAGNOSIS — C50419 Malignant neoplasm of upper-outer quadrant of unspecified female breast: Secondary | ICD-10-CM

## 2014-04-18 DIAGNOSIS — Z5111 Encounter for antineoplastic chemotherapy: Secondary | ICD-10-CM

## 2014-04-18 DIAGNOSIS — C50412 Malignant neoplasm of upper-outer quadrant of left female breast: Secondary | ICD-10-CM

## 2014-04-18 LAB — CBC WITH DIFFERENTIAL (CANCER CENTER ONLY)
BASO#: 0 10*3/uL (ref 0.0–0.2)
BASO%: 0.2 % (ref 0.0–2.0)
EOS%: 0 % (ref 0.0–7.0)
Eosinophils Absolute: 0 10*3/uL (ref 0.0–0.5)
HCT: 33.2 % — ABNORMAL LOW (ref 34.8–46.6)
HGB: 11.3 g/dL — ABNORMAL LOW (ref 11.6–15.9)
LYMPH#: 0.4 10*3/uL — ABNORMAL LOW (ref 0.9–3.3)
LYMPH%: 6.7 % — ABNORMAL LOW (ref 14.0–48.0)
MCH: 31.5 pg (ref 26.0–34.0)
MCHC: 34 g/dL (ref 32.0–36.0)
MCV: 93 fL (ref 81–101)
MONO#: 0.3 10*3/uL (ref 0.1–0.9)
MONO%: 5.4 % (ref 0.0–13.0)
NEUT#: 5 10*3/uL (ref 1.5–6.5)
NEUT%: 87.7 % — ABNORMAL HIGH (ref 39.6–80.0)
Platelets: 198 10*3/uL (ref 145–400)
RBC: 3.59 10*6/uL — ABNORMAL LOW (ref 3.70–5.32)
RDW: 14.9 % (ref 11.1–15.7)
WBC: 5.7 10*3/uL (ref 3.9–10.0)

## 2014-04-18 MED ORDER — SODIUM CHLORIDE 0.9 % IJ SOLN
10.0000 mL | INTRAMUSCULAR | Status: DC | PRN
  Administered 2014-04-18: 10 mL
  Filled 2014-04-18: qty 10

## 2014-04-18 MED ORDER — ALTEPLASE 2 MG IJ SOLR
2.0000 mg | Freq: Once | INTRAMUSCULAR | Status: DC | PRN
  Filled 2014-04-18: qty 2

## 2014-04-18 MED ORDER — HEPARIN SOD (PORK) LOCK FLUSH 100 UNIT/ML IV SOLN
250.0000 [IU] | Freq: Once | INTRAVENOUS | Status: DC | PRN
  Filled 2014-04-18: qty 5

## 2014-04-18 MED ORDER — SODIUM CHLORIDE 0.9 % IV SOLN
Freq: Once | INTRAVENOUS | Status: AC
  Administered 2014-04-18: 14:00:00 via INTRAVENOUS

## 2014-04-18 MED ORDER — PACLITAXEL PROTEIN-BOUND CHEMO INJECTION 100 MG
67.5000 mg/m2 | Freq: Once | INTRAVENOUS | Status: AC
  Administered 2014-04-18: 125 mg via INTRAVENOUS
  Filled 2014-04-18: qty 25

## 2014-04-18 MED ORDER — HEPARIN SOD (PORK) LOCK FLUSH 100 UNIT/ML IV SOLN
500.0000 [IU] | Freq: Once | INTRAVENOUS | Status: AC | PRN
  Administered 2014-04-18: 500 [IU]
  Filled 2014-04-18: qty 5

## 2014-04-18 MED ORDER — SODIUM CHLORIDE 0.9 % IJ SOLN
3.0000 mL | INTRAMUSCULAR | Status: DC | PRN
  Filled 2014-04-18: qty 10

## 2014-04-18 MED ORDER — ONDANSETRON 8 MG/50ML IVPB (CHCC)
8.0000 mg | Freq: Once | INTRAVENOUS | Status: AC
  Administered 2014-04-18: 8 mg via INTRAVENOUS

## 2014-04-18 MED ORDER — DEXAMETHASONE SODIUM PHOSPHATE 10 MG/ML IJ SOLN: 10.0000 mg | Freq: Once | INTRAMUSCULAR | Status: DC

## 2014-04-18 MED ORDER — DEXAMETHASONE SODIUM PHOSPHATE 10 MG/ML IJ SOLN
INTRAMUSCULAR | Status: AC
  Filled 2014-04-18: qty 1

## 2014-04-18 NOTE — Patient Instructions (Signed)

## 2014-04-19 ENCOUNTER — Ambulatory Visit (INDEPENDENT_AMBULATORY_CARE_PROVIDER_SITE_OTHER): Payer: Medicare Other | Admitting: Family Medicine

## 2014-04-19 ENCOUNTER — Telehealth: Payer: Self-pay | Admitting: *Deleted

## 2014-04-19 ENCOUNTER — Encounter: Payer: Self-pay | Admitting: Family Medicine

## 2014-04-19 VITALS — BP 135/77 | HR 61 | Temp 99.1°F | Ht 68.0 in | Wt 166.0 lb

## 2014-04-19 DIAGNOSIS — E039 Hypothyroidism, unspecified: Secondary | ICD-10-CM

## 2014-04-19 DIAGNOSIS — Z0000 Encounter for general adult medical examination without abnormal findings: Secondary | ICD-10-CM

## 2014-04-19 DIAGNOSIS — M899 Disorder of bone, unspecified: Secondary | ICD-10-CM

## 2014-04-19 DIAGNOSIS — M8580 Other specified disorders of bone density and structure, unspecified site: Secondary | ICD-10-CM

## 2014-04-19 DIAGNOSIS — M949 Disorder of cartilage, unspecified: Secondary | ICD-10-CM

## 2014-04-19 LAB — COMPREHENSIVE METABOLIC PANEL
ALT: 19 U/L (ref 0–35)
AST: 16 U/L (ref 0–37)
Albumin: 3.7 g/dL (ref 3.5–5.2)
Alkaline Phosphatase: 59 U/L (ref 39–117)
BUN: 35 mg/dL — ABNORMAL HIGH (ref 6–23)
CO2: 20 mEq/L (ref 19–32)
Calcium: 9.2 mg/dL (ref 8.4–10.5)
Chloride: 105 mEq/L (ref 96–112)
Creatinine, Ser: 1.04 mg/dL (ref 0.50–1.10)
Glucose, Bld: 166 mg/dL — ABNORMAL HIGH (ref 70–99)
Potassium: 4 mEq/L (ref 3.5–5.3)
Sodium: 135 mEq/L (ref 135–145)
Total Bilirubin: 0.8 mg/dL (ref 0.2–1.2)
Total Protein: 6 g/dL (ref 6.0–8.3)

## 2014-04-19 LAB — TSH: TSH: 0.78 u[IU]/mL (ref 0.35–4.50)

## 2014-04-19 LAB — T4, FREE: Free T4: 1.37 ng/dL (ref 0.60–1.60)

## 2014-04-19 LAB — VITAMIN D 25 HYDROXY (VIT D DEFICIENCY, FRACTURES): Vit D, 25-Hydroxy: 29 ng/mL — ABNORMAL LOW (ref 30–89)

## 2014-04-19 MED ORDER — LEVOTHYROXINE SODIUM 125 MCG PO TABS: 125.0000 ug | ORAL_TABLET | Freq: Every day | ORAL | Status: DC

## 2014-04-19 NOTE — Progress Notes (Signed)
Pre visit review using our clinic review tool, if applicable. No additional management support is needed unless otherwise documented below in the visit note. 

## 2014-04-19 NOTE — Progress Notes (Signed)
   Subjective:    Patient ID: Kim [name], female    DOB: 11/24/1934, 79 y.o.   MRN: [id number]  HPI 79 yr old female for a cpx. She feels well today. She just what she hopes was her last chemotherapy session yesterday, but she is waiting to hear back from Dr. Marin Olp on this. She has no concerns today. She has had all her usual labs in the past few months except for her thyroid levels. Her last DEXA was in 2010.    Review of Systems  Constitutional: Negative.   HENT: Negative.   Eyes: Negative.   Respiratory: Negative.   Cardiovascular: Negative.   Gastrointestinal: Negative.   Genitourinary: Negative for dysuria, urgency, frequency, hematuria, flank pain, decreased urine volume, enuresis, difficulty urinating, pelvic pain and dyspareunia.  Musculoskeletal: Negative.   Skin: Negative.   Neurological: Negative.   Psychiatric/Behavioral: Negative.        Objective:   Physical Exam  Constitutional: She is oriented to person, place, and time. She appears well-developed and well-nourished. No distress.  HENT:  Head: Normocephalic and atraumatic.  Right Ear: External ear normal.  Left Ear: External ear normal.  Nose: Nose normal.  Mouth/Throat: Oropharynx is clear and moist. No oropharyngeal exudate.  Eyes: Conjunctivae and EOM are normal. Pupils are equal, round, and reactive to light. No scleral icterus.  Neck: Normal range of motion. Neck supple. No JVD present. No thyromegaly present.  Cardiovascular: Normal rate, regular rhythm, normal heart sounds and intact distal pulses.  Exam reveals no gallop and no friction rub.   No murmur heard. Pulmonary/Chest: Effort normal and breath sounds normal. No respiratory distress. She has no wheezes. She has no rales. She exhibits no tenderness.  Abdominal: Soft. Bowel sounds are normal. She exhibits no distension and no mass. There is no tenderness. There is no rebound and no guarding.  Musculoskeletal: Normal range of motion. She  exhibits no edema and no tenderness.  Lymphadenopathy:    She has no cervical adenopathy.  Neurological: She is alert and oriented to person, place, and time. She has normal reflexes. No cranial nerve deficit. She exhibits normal muscle tone. Coordination normal.  Skin: Skin is warm and dry. No rash noted. No erythema.  Psychiatric: She has a normal mood and affect. Her behavior is normal. Judgment and thought content normal.          Assessment & Plan:  Well exam. Get a thyroid panel. Set up a DEXA soon

## 2014-04-19 NOTE — Telephone Encounter (Addendum)
Message copied by Lenn Sink on Fri Apr 19, 2014  4:06 PM ------      Message from: [name]      Created: Fri Apr 19, 2014 11:23 AM       Call - vit D is low!! How much is she taking!!!  Let me know in person!!  pete ------Informed pt her vit D level is low. Asked pt how much vit D she is taking. Pt stated her PCP started her on 5,000 units of vit D today.

## 2014-04-23 NOTE — Addendum Note (Signed)
Encounter addended by: Deirdre Evener, RN on: 04/23/2014  9:39 AM<BR>     Documentation filed: Charges VN

## 2014-04-23 NOTE — Addendum Note (Signed)
Encounter addended by: Deirdre Evener, RN on: 04/23/2014  9:40 AM<BR>     Documentation filed: Charges VN

## 2014-04-24 ENCOUNTER — Ambulatory Visit (HOSPITAL_BASED_OUTPATIENT_CLINIC_OR_DEPARTMENT_OTHER)
Admission: RE | Admit: 2014-04-24 | Discharge: 2014-04-24 | Disposition: A | Discharge status: Home / Self Care | Payer: Medicare Other | Source: Ambulatory Visit | Attending: Hematology & Oncology | Admitting: Hematology & Oncology

## 2014-04-24 DIAGNOSIS — C50412 Malignant neoplasm of upper-outer quadrant of left female breast: Secondary | ICD-10-CM

## 2014-04-24 DIAGNOSIS — C50419 Malignant neoplasm of upper-outer quadrant of unspecified female breast: Secondary | ICD-10-CM | POA: Insufficient documentation

## 2014-04-24 DIAGNOSIS — I059 Rheumatic mitral valve disease, unspecified: Secondary | ICD-10-CM

## 2014-04-24 NOTE — Progress Notes (Signed)
  Echocardiogram 2D Echocardiogram has been performed.  Mauricio Po 04/24/2014, 11:59 AM

## 2014-04-25 ENCOUNTER — Other Ambulatory Visit: Payer: Self-pay

## 2014-04-29 ENCOUNTER — Encounter: Payer: Self-pay | Admitting: Hematology & Oncology

## 2014-04-29 ENCOUNTER — Telehealth (HOSPITAL_COMMUNITY): Payer: Self-pay | Admitting: Vascular Surgery

## 2014-04-29 ENCOUNTER — Other Ambulatory Visit (HOSPITAL_BASED_OUTPATIENT_CLINIC_OR_DEPARTMENT_OTHER): Payer: Medicare Other | Admitting: Lab

## 2014-04-29 ENCOUNTER — Ambulatory Visit (HOSPITAL_BASED_OUTPATIENT_CLINIC_OR_DEPARTMENT_OTHER): Payer: Medicare Other

## 2014-04-29 ENCOUNTER — Ambulatory Visit (HOSPITAL_BASED_OUTPATIENT_CLINIC_OR_DEPARTMENT_OTHER): Payer: Medicare Other | Admitting: Hematology & Oncology

## 2014-04-29 ENCOUNTER — Encounter (HOSPITAL_COMMUNITY): Payer: Self-pay | Admitting: Vascular Surgery

## 2014-04-29 VITALS — BP 144/88 | HR 79 | Temp 97.9°F | Resp 14 | Wt 167.0 lb

## 2014-04-29 DIAGNOSIS — M949 Disorder of cartilage, unspecified: Secondary | ICD-10-CM

## 2014-04-29 DIAGNOSIS — C50419 Malignant neoplasm of upper-outer quadrant of unspecified female breast: Secondary | ICD-10-CM

## 2014-04-29 DIAGNOSIS — C50919 Malignant neoplasm of unspecified site of unspecified female breast: Secondary | ICD-10-CM

## 2014-04-29 DIAGNOSIS — Z170 Estrogen receptor positive status [ER+]: Secondary | ICD-10-CM

## 2014-04-29 DIAGNOSIS — C773 Secondary and unspecified malignant neoplasm of axilla and upper limb lymph nodes: Secondary | ICD-10-CM

## 2014-04-29 DIAGNOSIS — C50412 Malignant neoplasm of upper-outer quadrant of left female breast: Secondary | ICD-10-CM

## 2014-04-29 DIAGNOSIS — Z5112 Encounter for antineoplastic immunotherapy: Secondary | ICD-10-CM

## 2014-04-29 DIAGNOSIS — C50912 Malignant neoplasm of unspecified site of left female breast: Secondary | ICD-10-CM

## 2014-04-29 DIAGNOSIS — M899 Disorder of bone, unspecified: Secondary | ICD-10-CM

## 2014-04-29 LAB — CBC WITH DIFFERENTIAL (CANCER CENTER ONLY)
BASO#: 0 10*3/uL (ref 0.0–0.2)
BASO#: 0 10*3/uL (ref 0.0–0.2)
BASO%: 0.3 % (ref 0.0–2.0)
BASO%: 0.3 % (ref 0.0–2.0)
EOS%: 0.3 % (ref 0.0–7.0)
EOS%: 0.3 % (ref 0.0–7.0)
Eosinophils Absolute: 0 10*3/uL (ref 0.0–0.5)
Eosinophils Absolute: 0 10*3/uL (ref 0.0–0.5)
HCT: 31.8 % — ABNORMAL LOW (ref 34.8–46.6)
HCT: 31.8 % — ABNORMAL LOW (ref 34.8–46.6)
HGB: 10.5 g/dL — ABNORMAL LOW (ref 11.6–15.9)
HGB: 10.5 g/dL — ABNORMAL LOW (ref 11.6–15.9)
LYMPH#: 0.3 10*3/uL — ABNORMAL LOW (ref 0.9–3.3)
LYMPH#: 0.3 10*3/uL — ABNORMAL LOW (ref 0.9–3.3)
LYMPH%: 9.1 % — ABNORMAL LOW (ref 14.0–48.0)
LYMPH%: 9.1 % — ABNORMAL LOW (ref 14.0–48.0)
MCH: 30.4 pg (ref 26.0–34.0)
MCH: 30.4 pg (ref 26.0–34.0)
MCHC: 33 g/dL (ref 32.0–36.0)
MCHC: 33 g/dL (ref 32.0–36.0)
MCV: 92 fL (ref 81–101)
MCV: 92 fL (ref 81–101)
MONO#: 0.1 10*3/uL (ref 0.1–0.9)
MONO#: 0.1 10*3/uL (ref 0.1–0.9)
MONO%: 2.3 % (ref 0.0–13.0)
MONO%: 2.3 % (ref 0.0–13.0)
NEUT#: 2.7 10*3/uL (ref 1.5–6.5)
NEUT#: 2.7 10*3/uL (ref 1.5–6.5)
NEUT%: 88 % — ABNORMAL HIGH (ref 39.6–80.0)
NEUT%: 88 % — ABNORMAL HIGH (ref 39.6–80.0)
Platelets: 129 10*3/uL — ABNORMAL LOW (ref 145–400)
Platelets: 129 10*3/uL — ABNORMAL LOW (ref 145–400)
RBC: 3.45 10*6/uL — ABNORMAL LOW (ref 3.70–5.32)
RBC: 3.45 10*6/uL — ABNORMAL LOW (ref 3.70–5.32)
RDW: 15.3 % (ref 11.1–15.7)
RDW: 15.3 % (ref 11.1–15.7)
WBC: 3.1 10*3/uL — ABNORMAL LOW (ref 3.9–10.0)
WBC: 3.1 10*3/uL — ABNORMAL LOW (ref 3.9–10.0)

## 2014-04-29 LAB — CMP (CANCER CENTER ONLY)
ALT(SGPT): 22 U/L (ref 10–47)
AST: 22 U/L (ref 11–38)
Albumin: 3.5 g/dL (ref 3.3–5.5)
Alkaline Phosphatase: 54 U/L (ref 26–84)
BUN, Bld: 21 mg/dL (ref 7–22)
CO2: 25 mEq/L (ref 18–33)
Calcium: 8.9 mg/dL (ref 8.0–10.3)
Chloride: 100 mEq/L (ref 98–108)
Creat: 0.6 mg/dl (ref 0.6–1.2)
Glucose, Bld: 188 mg/dL — ABNORMAL HIGH (ref 73–118)
Potassium: 3.9 mEq/L (ref 3.3–4.7)
Sodium: 142 mEq/L (ref 128–145)
Total Bilirubin: 0.8 mg/dl (ref 0.20–1.60)
Total Protein: 6.7 g/dL (ref 6.4–8.1)

## 2014-04-29 MED ORDER — ZOLEDRONIC ACID 4 MG/100ML IV SOLN
4.0000 mg | Freq: Once | INTRAVENOUS | Status: AC
  Administered 2014-04-29: 4 mg via INTRAVENOUS
  Filled 2014-04-29: qty 100

## 2014-04-29 MED ORDER — ACETAMINOPHEN 325 MG PO TABS
ORAL_TABLET | ORAL | Status: AC
  Filled 2014-04-29: qty 2

## 2014-04-29 MED ORDER — ACETAMINOPHEN 325 MG PO TABS
650.0000 mg | ORAL_TABLET | Freq: Once | ORAL | Status: AC
  Administered 2014-04-29: 650 mg via ORAL

## 2014-04-29 MED ORDER — HEPARIN SOD (PORK) LOCK FLUSH 100 UNIT/ML IV SOLN
500.0000 [IU] | Freq: Once | INTRAVENOUS | Status: AC | PRN
  Administered 2014-04-29: 500 [IU]
  Filled 2014-04-29: qty 5

## 2014-04-29 MED ORDER — DIPHENHYDRAMINE HCL 25 MG PO CAPS
ORAL_CAPSULE | ORAL | Status: AC
  Filled 2014-04-29: qty 2

## 2014-04-29 MED ORDER — DIPHENHYDRAMINE HCL 25 MG PO CAPS
50.0000 mg | ORAL_CAPSULE | Freq: Once | ORAL | Status: AC
  Administered 2014-04-29: 50 mg via ORAL

## 2014-04-29 MED ORDER — TRASTUZUMAB CHEMO INJECTION 440 MG
6.0000 mg/kg | Freq: Once | INTRAVENOUS | Status: AC
  Administered 2014-04-29: 462 mg via INTRAVENOUS
  Filled 2014-04-29: qty 22

## 2014-04-29 MED ORDER — SODIUM CHLORIDE 0.9 % IJ SOLN
10.0000 mL | INTRAMUSCULAR | Status: DC | PRN
  Administered 2014-04-29: 10 mL
  Filled 2014-04-29: qty 10

## 2014-04-29 MED ORDER — SODIUM CHLORIDE 0.9 % IV SOLN
Freq: Once | INTRAVENOUS | Status: AC
  Administered 2014-04-29: 11:00:00 via INTRAVENOUS

## 2014-04-29 NOTE — Patient Instructions (Signed)
Trastuzumab injection for infusion What is this medicine? TRASTUZUMAB (tras TOO zoo mab) is a monoclonal antibody. It targets a protein called HER2. This protein is found in some stomach and breast cancers. This medicine can stop cancer cell growth. This medicine may be used with other cancer treatments. This medicine may be used for other purposes; ask your health care provider or pharmacist if you have questions. COMMON BRAND NAME(S): Herceptin What should I tell my health care provider before I take this medicine? They need to know if you have any of these conditions: -heart disease -heart failure -infection (especially a virus infection such as chickenpox, cold sores, or herpes) -lung or breathing disease, like asthma -recent or ongoing radiation therapy -an unusual or allergic reaction to trastuzumab, benzyl alcohol, or other medications, foods, dyes, or preservatives -pregnant or trying to get pregnant -breast-feeding How should I use this medicine? This drug is given as an infusion into a vein. It is administered in a hospital or clinic by a specially trained health care professional. Talk to your pediatrician regarding the use of this medicine in children. This medicine is not approved for use in children. Overdosage: If you think you have taken too much of this medicine contact a poison control center or emergency room at once. NOTE: This medicine is only for you. Do not share this medicine with others. What if I miss a dose? It is important not to miss a dose. Call your doctor or health care professional if you are unable to keep an appointment. What may interact with this medicine? -cyclophosphamide -doxorubicin -warfarin This list may not describe all possible interactions. Give your health care provider a list of all the medicines, herbs, non-prescription drugs, or dietary supplements you use. Also tell them if you smoke, drink alcohol, or use illegal drugs. Some items may  interact with your medicine. What should I watch for while using this medicine? Visit your doctor for checks on your progress. Report any side effects. Continue your course of treatment even though you feel ill unless your doctor tells you to stop. Call your doctor or health care professional for advice if you get a fever, chills or sore throat, or other symptoms of a cold or flu. Do not treat yourself. Try to avoid being around people who are sick. You may experience fever, chills and shaking during your first infusion. These effects are usually mild and can be treated with other medicines. Report any side effects during the infusion to your health care professional. Fever and chills usually do not happen with later infusions. What side effects may I notice from receiving this medicine? Side effects that you should report to your doctor or other health care professional as soon as possible: -breathing difficulties -chest pain or palpitations -cough -dizziness or fainting -fever or chills, sore throat -skin rash, itching or hives -swelling of the legs or ankles -unusually weak or tired Side effects that usually do not require medical attention (report to your doctor or other health care professional if they continue or are bothersome): -loss of appetite -headache -muscle aches -nausea This list may not describe all possible side effects. Call your doctor for medical advice about side effects. You may report side effects to FDA at 1-800-FDA-1088. Where should I keep my medicine? This drug is given in a hospital or clinic and will not be stored at home. NOTE: This sheet is a summary. It may not cover all possible information. If you have questions about this medicine, talk   to your doctor, pharmacist, or health care provider.  2015, Elsevier/Gold Standard. (2009-07-25 13:43:15) Zoledronic Acid injection (Hypercalcemia, Oncology) What is this medicine? ZOLEDRONIC ACID (ZOE le dron ik AS id)  lowers the amount of calcium loss from bone. It is used to treat too much calcium in your blood from cancer. It is also used to prevent complications of cancer that has spread to the bone. This medicine may be used for other purposes; ask your health care provider or pharmacist if you have questions. COMMON BRAND NAME(S): Zometa What should I tell my health care provider before I take this medicine? They need to know if you have any of these conditions: -aspirin-sensitive asthma -cancer, especially if you are receiving medicines used to treat cancer -dental disease or wear dentures -infection -kidney disease -receiving corticosteroids like dexamethasone or prednisone -an unusual or allergic reaction to zoledronic acid, other medicines, foods, dyes, or preservatives -pregnant or trying to get pregnant -breast-feeding How should I use this medicine? This medicine is for infusion into a vein. It is given by a health care professional in a hospital or clinic setting. Talk to your pediatrician regarding the use of this medicine in children. Special care may be needed. Overdosage: If you think you have taken too much of this medicine contact a poison control center or emergency room at once. NOTE: This medicine is only for you. Do not share this medicine with others. What if I miss a dose? It is important not to miss your dose. Call your doctor or health care professional if you are unable to keep an appointment. What may interact with this medicine? -certain antibiotics given by injection -NSAIDs, medicines for pain and inflammation, like ibuprofen or naproxen -some diuretics like bumetanide, furosemide -teriparatide -thalidomide This list may not describe all possible interactions. Give your health care provider a list of all the medicines, herbs, non-prescription drugs, or dietary supplements you use. Also tell them if you smoke, drink alcohol, or use illegal drugs. Some items may interact  with your medicine. What should I watch for while using this medicine? Visit your doctor or health care professional for regular checkups. It may be some time before you see the benefit from this medicine. Do not stop taking your medicine unless your doctor tells you to. Your doctor may order blood tests or other tests to see how you are doing. Women should inform their doctor if they wish to become pregnant or think they might be pregnant. There is a potential for serious side effects to an unborn child. Talk to your health care professional or pharmacist for more information. You should make sure that you get enough calcium and vitamin D while you are taking this medicine. Discuss the foods you eat and the vitamins you take with your health care professional. Some people who take this medicine have severe bone, joint, and/or muscle pain. This medicine may also increase your risk for jaw problems or a broken thigh bone. Tell your doctor right away if you have severe pain in your jaw, bones, joints, or muscles. Tell your doctor if you have any pain that does not go away or that gets worse. Tell your dentist and dental surgeon that you are taking this medicine. You should not have major dental surgery while on this medicine. See your dentist to have a dental exam and fix any dental problems before starting this medicine. Take good care of your teeth while on this medicine. Make sure you see your dentist for regular  follow-up appointments. What side effects may I notice from receiving this medicine? Side effects that you should report to your doctor or health care professional as soon as possible: -allergic reactions like skin rash, itching or hives, swelling of the face, lips, or tongue -anxiety, confusion, or depression -breathing problems -changes in vision -eye pain -feeling faint or lightheaded, falls -jaw pain, especially after dental work -mouth sores -muscle cramps, stiffness, or  weakness -trouble passing urine or change in the amount of urine Side effects that usually do not require medical attention (report to your doctor or health care professional if they continue or are bothersome): -bone, joint, or muscle pain -constipation -diarrhea -fever -hair loss -irritation at site where injected -loss of appetite -nausea, vomiting -stomach upset -trouble sleeping -trouble swallowing -weak or tired This list may not describe all possible side effects. Call your doctor for medical advice about side effects. You may report side effects to FDA at 1-800-FDA-1088. Where should I keep my medicine? This drug is given in a hospital or clinic and will not be stored at home. NOTE: This sheet is a summary. It may not cover all possible information. If you have questions about this medicine, talk to your doctor, pharmacist, or health care provider.  2015, Elsevier/Gold Standard. (2013-03-01 13:03:13)

## 2014-04-29 NOTE — Progress Notes (Signed)
Hematology and Oncology Follow Up Visit  Kim [name] [id number] [birth year] 79 y.o. 04/29/2014   Principle Diagnosis:  Stage IIA (T1N1M0) adenocarcinoma of the left breast-triple positive  Current Therapy:   Status post cycle 4 of Abraxane/Herceptin Patient to start maintenance Herceptin Radiation therapy to start in one week Zometa 4 mg IV every 6 months Femara 2.5 mg by mouth daily to start after radiation therapy is complete.     Interim History:  Ms.  Kim [name] is back for followup. She has her good days and bad days. She completed 4 cycles of therapy. She actually did pretty well with them. She had some nausea and vomiting at the beginning but we got this under better control.  She did have a followup echocardiogram. This showed an ejection fraction of 65-70%.  She does have some slight swelling in her legs. She has a little bit of neuropathy in her feet.  Her appetite has been good. She's worried about gaining some weight.  She has seen a radiation oncology. She will start radiation in a week or so.  She's had no cough. She's had no dysphasia. She's had no diarrhea constipation. There have been no rashes.  She has a performance status of ECOG 1.    Medications: Current outpatient prescriptions:acetaminophen (TYLENOL) 650 MG CR tablet, Take 650 mg by mouth every 8 (eight) hours as needed., Disp: , Rfl: ;  acidophilus (RISAQUAD) CAPS capsule, Take 1 capsule by mouth daily., Disp: , Rfl: ;  Ascorbic Acid (VITAMIN C) 1000 MG tablet, Take 1,000 mg by mouth daily., Disp: , Rfl: ;  aspirin EC 81 MG EC tablet, Take 1 tablet (81 mg total) by mouth daily., Disp: , Rfl:  atorvastatin (LIPITOR) 10 MG tablet, Take 1 tablet (10 mg total) by mouth daily at 6 PM., Disp: 30 tablet, Rfl: 6;  benazepril (LOTENSIN) 10 MG tablet, Take 10 mg by mouth daily. , Disp: , Rfl: ;  Calcium Citrate-Vitamin D (CITRACAL MAXIMUM PO), Take 1 tablet by mouth daily. , Disp: , Rfl: ;  cetirizine (ZYRTEC) 10 MG  tablet, Take 10 mg by mouth daily as needed for allergies., Disp: , Rfl:  clopidogrel (PLAVIX) 75 MG tablet, Take 1 tablet (75 mg total) by mouth daily with breakfast., Disp: 30 tablet, Rfl: 6;  Cyanocobalamin (VITAMIN B-12) 2500 MCG SUBL, Place under the tongue as needed., Disp: , Rfl: ;  dexamethasone (DECADRON) 4 MG tablet, Take 2 tablets (8 mg total) by mouth as directed. 2 tabs in AM and 2 in PM day before chemo & 2 in Am and 2 in PM for 3 days after chemo., Disp: 30 tablet, Rfl: 5 furosemide (LASIX) 20 MG tablet, Take 1 tablet (20 mg total) by mouth daily. Take 1 tablet (20 mg total) every day x 3 days then take once daily as needed, Disp: 30 tablet, Rfl: 0;  levothyroxine (SYNTHROID, LEVOTHROID) 125 MCG tablet, Take 1 tablet (125 mcg total) by mouth daily before breakfast., Disp: 90 tablet, Rfl: 3;  lidocaine-prilocaine (EMLA) cream, Apply topically as needed., Disp: 30 g, Rfl: 6 Loperamide HCl (IMODIUM PO), Take by mouth as needed., Disp: , Rfl: ;  metoprolol succinate (TOPROL-XL) 50 MG 24 hr tablet, Take 1 & 1/2 tabs daily, Disp: 45 tablet, Rfl: 6;  Multiple Vitamins-Minerals (CENTRUM ADULTS PO), Take by mouth., Disp: , Rfl: ;  nitroGLYCERIN (NITROSTAT) 0.4 MG SL tablet, Place 1 tablet (0.4 mg total) under the tongue every 5 (five) minutes x 3 doses as needed for chest pain.,  Disp: 25 tablet, Rfl: 3 pantoprazole (PROTONIX) 40 MG tablet, Take 1 tablet (40 mg total) by mouth daily at 6 (six) AM., Disp: 30 tablet, Rfl: 6;  [DISCONTINUED] ferrous sulfate 325 (65 FE) MG tablet, Take 325 mg by mouth 2 (two) times daily.  , Disp: , Rfl:   Allergies:  Allergies  Allergen Reactions  . Propoxyphene N-Acetaminophen Swelling    tongue swelling  . Penicillins Swelling    Facial swelling  . Cortisone Rash  . Pseudoephedrine Hcl Er Rash and Other (See Comments)    Face gets red    Past Medical History, Surgical history, Social history, and Family History were reviewed and updated.  Review of  Systems: As above  Physical Exam:  weight is 167 lb (75.751 kg). Her oral temperature is 97.9 F (36.6 C). Her blood pressure is 144/88 and her pulse is 79. Her respiration is 14.   Well-developed and well-nourished white female. Her head and neck exam shows no ocular or oral lesions. She has no palpable cervical or supraclavicular lymph nodes. Lungs are clear bilaterally. Cardiac exam regular rate and rhythm with a 2/6 systolic ejection murmur. Breast exam shows right breast with no masses edema or erythema. There is no right axillary adenopathy. Left breast shows well-healed lumpectomy at the 2:00 position. There is no tenderness at the lumpectomy site. There is no masses in the left breast. There is no left axillary adenopathy.. Abdomen is soft. She's good bowel sounds. There is no palpable liver or spleen tip. Back exam shows no tenderness over the spine ribs or hips. Extremities shows no clubbing cyanosis or edema. Neurological exam shows no focal neurological deficits.  Lab Results  Component Value Date   WBC 3.1* 04/29/2014   HGB 10.5* 04/29/2014   HCT 31.8* 04/29/2014   MCV 92 04/29/2014   PLT 129* 04/29/2014     Chemistry      Component Value Date/Time   NA 142 04/29/2014 0938   NA 135 04/18/2014 1319   NA 137 01/28/2014 1354   K 3.9 04/29/2014 0938   K 4.0 04/18/2014 1319   K 4.3 01/28/2014 1354   CL 100 04/29/2014 0938   CL 105 04/18/2014 1319   CO2 25 04/29/2014 0938   CO2 20 04/18/2014 1319   CO2 21* 01/28/2014 1354   BUN 21 04/29/2014 0938   BUN 35* 04/18/2014 1319   BUN 35.3* 01/28/2014 1354   CREATININE 0.6 04/29/2014 0938   CREATININE 1.04 04/18/2014 1319   CREATININE 1.3* 01/28/2014 1354      Component Value Date/Time   CALCIUM 8.9 04/29/2014 0938   CALCIUM 9.2 04/18/2014 1319   CALCIUM 9.4 01/28/2014 1354   ALKPHOS 54 04/29/2014 0938   ALKPHOS 59 04/18/2014 1319   ALKPHOS 63 01/28/2014 1354   AST 22 04/29/2014 0938   AST 16 04/18/2014 1319   AST 10 01/28/2014 1354   ALT 22  04/29/2014 0938   ALT 19 04/18/2014 1319   ALT 13 01/28/2014 1354   BILITOT 0.80 04/29/2014 0938   BILITOT 0.8 04/18/2014 1319   BILITOT 0.76 01/28/2014 1354         Impression and Plan: Ms. Routson is 79 year old white female with stage IIA ductal carcinoma of the left breast. She had 4 positive lymph nodes. Her breast cancer was triple positive. She completed her adjuvant chemotherapy. She completed this in July of 2015.  We now will proceed with maintenance therapy with Herceptin for one year. This will be given every 3  weeks. Her cardiac function is very good. I don't think we need to do another echocardiogram probably in still October.  I also do not think that some Zometa will help. I think twice a year therapy with Zometa is worthwhile.  We will not start Femara until after radiation is completed.  I spent about 35 minutes with she and her husband. I answered all of their questions. I explained to Ms. Downard how the remainder of her therapy will be. I explained why everything was being done as it is scheduled. I explained why a year of Herceptin was necessary.  I want to see her back in 3 weeks. We will continue to follow her closely during the radiation therapy while she is getting the Herceptin.   [name], MD 7/27/20155:26 PM

## 2014-04-30 ENCOUNTER — Encounter (HOSPITAL_COMMUNITY): Payer: Self-pay | Admitting: Vascular Surgery

## 2014-04-30 ENCOUNTER — Encounter: Payer: Self-pay | Admitting: Nurse Practitioner

## 2014-05-06 ENCOUNTER — Ambulatory Visit
Admission: RE | Admit: 2014-05-06 | Discharge: 2014-05-06 | Disposition: A | Discharge status: Home / Self Care | Payer: Medicare Other | Source: Ambulatory Visit | Attending: Radiation Oncology | Admitting: Radiation Oncology

## 2014-05-06 DIAGNOSIS — Z510 Encounter for antineoplastic radiation therapy: Secondary | ICD-10-CM | POA: Diagnosis not present

## 2014-05-06 DIAGNOSIS — C50412 Malignant neoplasm of upper-outer quadrant of left female breast: Secondary | ICD-10-CM

## 2014-05-06 NOTE — Progress Notes (Signed)
SIMULATION AND TREATMENT PLANNING NOTE  Outpatient  DIAGNOSIS:  Left breast cancer    ICD-9-CM  1. Breast cancer of upper-outer quadrant of left female breast 174.4     NARRATIVE:  The patient was brought to the [facility].  Identity was confirmed.  All relevant records and images related to the planned course of therapy were reviewed.  The patient freely provided informed written consent to proceed with treatment after reviewing the details related to the planned course of therapy. The consent form was witnessed and verified by the simulation staff.    Then, the patient was set-up in a stable reproducible  supine position for radiation therapy - arms in breast board, head in accuform.  CT images were obtained.  Surface markings were placed.  The CT images were loaded into the planning software.     In order to account for effect of respiratory motion on target structures and other organs in the planning and delivery of radiotherapy, this patient underwent respiratory motion management simulation.  To accomplish this, when the patient was brought to the CT simulation planning suite, 4D respiratory motion management CT images were obtained.  BUT, it was determined that the breathhold technique was difficult for the patient to perform and it would not significantly increase sparing of the heart from radiation dose. Therefore, breath hold will not be recommended.  TREATMENT PLANNING NOTE: Treatment planning then occurred.  The radiation prescription was entered and confirmed.    A total of 5 medically necessary complex treatment devices were fabricated and supervised by me - 2 tangential fields with MLCs to block lung/heart, 1 PAB fields with MLCs to block lung, cord, esophagus, and 1 SCV field with MLCS to block lung, cord, esophagus, and 1 accuform device for upper body/arm. I have requested : 3D Simulation.  I have requested a DVH of the following structures: lungs, heart,  lumpectomy cavity, esophagus, cord.     The patient will receive 50 Gy in 25 fraction to the left breast. She will receive 47.5 Gy in 25 fractions to the left SCV nodes and axilla. This will be followed by a boost to the lumpectomy cavity of 10 Gy in 5 fractions.  Optical Surface Tracking Plan:  Since intensity modulated radiotherapy (IMRT) and 3D conformal radiation treatment methods are predicated on accurate and precise positioning for treatment, intrafraction motion monitoring is medically necessary to ensure accurate and safe treatment delivery. The ability to quantify intrafraction motion without excessive ionizing radiation dose can only be performed with optical surface tracking. Accordingly, surface imaging offers the opportunity to obtain 3D measurements of patient position throughout IMRT and 3D treatments without excessive radiation exposure. I am ordering optical surface tracking for this patient's upcoming course of radiotherapy.  ________________________________   Reference:  Ursula Alert, J, et al. Surface imaging-based analysis of intrafraction motion for breast radiotherapy patients.Journal of [place], n. 6, nov. 2014. ISSN [id number].  Available at: <http://www.jacmp.org/index.php/jacmp/article/view/4957>.  -----------------------------------  Eppie Gibson, MD

## 2014-05-10 DIAGNOSIS — Z510 Encounter for antineoplastic radiation therapy: Secondary | ICD-10-CM | POA: Diagnosis not present

## 2014-05-13 ENCOUNTER — Ambulatory Visit
Admission: RE | Admit: 2014-05-13 | Discharge: 2014-05-13 | Disposition: A | Discharge status: Home / Self Care | Payer: Medicare Other | Source: Ambulatory Visit | Attending: Radiation Oncology | Admitting: Radiation Oncology

## 2014-05-13 DIAGNOSIS — C50412 Malignant neoplasm of upper-outer quadrant of left female breast: Secondary | ICD-10-CM

## 2014-05-13 DIAGNOSIS — Z510 Encounter for antineoplastic radiation therapy: Secondary | ICD-10-CM | POA: Diagnosis not present

## 2014-05-13 NOTE — Progress Notes (Signed)
Simulation Verification Note  The patient was brought to the treatment unit and placed in the planned treatment position. The clinical setup was verified. Then port films were obtained and uploaded to the radiation oncology medical record software.  The treatment beams were carefully compared against the planned radiation fields. The position location and shape of the radiation fields was reviewed. They targeted volume of tissue appears to be appropriately covered by the radiation beams. Organs at risk appear to be excluded as planned.  Based on my personal review, I approved the simulation verification. The patient's treatment will proceed as planned.  -----------------------------------  Kaian Fahs, MD  

## 2014-05-14 ENCOUNTER — Ambulatory Visit
Admission: RE | Admit: 2014-05-14 | Discharge: 2014-05-14 | Disposition: A | Discharge status: Home / Self Care | Payer: Medicare Other | Source: Ambulatory Visit | Attending: Radiation Oncology | Admitting: Radiation Oncology

## 2014-05-14 ENCOUNTER — Encounter: Payer: Self-pay | Admitting: Family Medicine

## 2014-05-14 ENCOUNTER — Ambulatory Visit
Admission: RE | Admit: 2014-05-14 | Payer: Medicare Other | Source: Ambulatory Visit | Attending: Radiation Oncology | Admitting: Radiation Oncology

## 2014-05-14 DIAGNOSIS — Z510 Encounter for antineoplastic radiation therapy: Secondary | ICD-10-CM | POA: Diagnosis not present

## 2014-05-15 ENCOUNTER — Ambulatory Visit
Admission: RE | Admit: 2014-05-15 | Discharge: 2014-05-15 | Disposition: A | Discharge status: Home / Self Care | Payer: Medicare Other | Source: Ambulatory Visit | Attending: Radiation Oncology | Admitting: Radiation Oncology

## 2014-05-15 DIAGNOSIS — Z510 Encounter for antineoplastic radiation therapy: Secondary | ICD-10-CM | POA: Diagnosis not present

## 2014-05-16 ENCOUNTER — Ambulatory Visit
Admission: RE | Admit: 2014-05-16 | Discharge: 2014-05-16 | Disposition: A | Discharge status: Home / Self Care | Payer: Medicare Other | Source: Ambulatory Visit | Attending: Radiation Oncology | Admitting: Radiation Oncology

## 2014-05-16 DIAGNOSIS — Z510 Encounter for antineoplastic radiation therapy: Secondary | ICD-10-CM | POA: Diagnosis not present

## 2014-05-17 ENCOUNTER — Ambulatory Visit
Admission: RE | Admit: 2014-05-17 | Discharge: 2014-05-17 | Disposition: A | Discharge status: Home / Self Care | Payer: Medicare Other | Source: Ambulatory Visit | Attending: Radiation Oncology | Admitting: Radiation Oncology

## 2014-05-17 DIAGNOSIS — Z510 Encounter for antineoplastic radiation therapy: Secondary | ICD-10-CM | POA: Diagnosis not present

## 2014-05-20 ENCOUNTER — Ambulatory Visit
Admission: RE | Admit: 2014-05-20 | Discharge: 2014-05-20 | Disposition: A | Discharge status: Home / Self Care | Payer: Medicare Other | Source: Ambulatory Visit | Attending: Radiation Oncology | Admitting: Radiation Oncology

## 2014-05-20 ENCOUNTER — Ambulatory Visit (HOSPITAL_BASED_OUTPATIENT_CLINIC_OR_DEPARTMENT_OTHER): Payer: Medicare Other | Admitting: Family

## 2014-05-20 ENCOUNTER — Other Ambulatory Visit (HOSPITAL_BASED_OUTPATIENT_CLINIC_OR_DEPARTMENT_OTHER): Payer: Medicare Other | Admitting: Lab

## 2014-05-20 ENCOUNTER — Ambulatory Visit (HOSPITAL_BASED_OUTPATIENT_CLINIC_OR_DEPARTMENT_OTHER): Payer: Medicare Other

## 2014-05-20 ENCOUNTER — Encounter: Payer: Self-pay | Admitting: Family

## 2014-05-20 VITALS — BP 141/71 | HR 70 | Temp 97.9°F | Resp 14 | Ht 68.0 in | Wt 167.0 lb

## 2014-05-20 VITALS — BP 136/63 | HR 64 | Temp 97.8°F | Resp 12 | Wt 169.9 lb

## 2014-05-20 DIAGNOSIS — C50419 Malignant neoplasm of upper-outer quadrant of unspecified female breast: Secondary | ICD-10-CM

## 2014-05-20 DIAGNOSIS — Z510 Encounter for antineoplastic radiation therapy: Secondary | ICD-10-CM | POA: Diagnosis not present

## 2014-05-20 DIAGNOSIS — C50412 Malignant neoplasm of upper-outer quadrant of left female breast: Secondary | ICD-10-CM

## 2014-05-20 DIAGNOSIS — C50919 Malignant neoplasm of unspecified site of unspecified female breast: Secondary | ICD-10-CM

## 2014-05-20 DIAGNOSIS — C50912 Malignant neoplasm of unspecified site of left female breast: Secondary | ICD-10-CM

## 2014-05-20 DIAGNOSIS — D0512 Intraductal carcinoma in situ of left breast: Secondary | ICD-10-CM

## 2014-05-20 DIAGNOSIS — Z5112 Encounter for antineoplastic immunotherapy: Secondary | ICD-10-CM

## 2014-05-20 LAB — CBC WITH DIFFERENTIAL (CANCER CENTER ONLY)
BASO#: 0.1 10*3/uL (ref 0.0–0.2)
BASO%: 1.2 % (ref 0.0–2.0)
EOS%: 4.3 % (ref 0.0–7.0)
Eosinophils Absolute: 0.2 10*3/uL (ref 0.0–0.5)
HCT: 34.3 % — ABNORMAL LOW (ref 34.8–46.6)
HGB: 11.1 g/dL — ABNORMAL LOW (ref 11.6–15.9)
LYMPH#: 0.9 10*3/uL (ref 0.9–3.3)
LYMPH%: 20.8 % (ref 14.0–48.0)
MCH: 29.9 pg (ref 26.0–34.0)
MCHC: 32.4 g/dL (ref 32.0–36.0)
MCV: 93 fL (ref 81–101)
MONO#: 0.4 10*3/uL (ref 0.1–0.9)
MONO%: 8.6 % (ref 0.0–13.0)
NEUT#: 2.7 10*3/uL (ref 1.5–6.5)
NEUT%: 65.1 % (ref 39.6–80.0)
Platelets: 156 10*3/uL (ref 145–400)
RBC: 3.71 10*6/uL (ref 3.70–5.32)
RDW: 15.6 % (ref 11.1–15.7)
WBC: 4.2 10*3/uL (ref 3.9–10.0)

## 2014-05-20 LAB — CMP (CANCER CENTER ONLY)
ALT(SGPT): 16 U/L (ref 10–47)
AST: 17 U/L (ref 11–38)
Albumin: 3.5 g/dL (ref 3.3–5.5)
Alkaline Phosphatase: 51 U/L (ref 26–84)
BUN, Bld: 19 mg/dL (ref 7–22)
CO2: 27 mEq/L (ref 18–33)
Calcium: 8.8 mg/dL (ref 8.0–10.3)
Chloride: 104 mEq/L (ref 98–108)
Creat: 0.9 mg/dl (ref 0.6–1.2)
Glucose, Bld: 91 mg/dL (ref 73–118)
Potassium: 3.8 mEq/L (ref 3.3–4.7)
Sodium: 140 mEq/L (ref 128–145)
Total Bilirubin: 0.8 mg/dl (ref 0.20–1.60)
Total Protein: 6.7 g/dL (ref 6.4–8.1)

## 2014-05-20 MED ORDER — ACETAMINOPHEN 325 MG PO TABS
ORAL_TABLET | ORAL | Status: AC
  Filled 2014-05-20: qty 2

## 2014-05-20 MED ORDER — HEPARIN SOD (PORK) LOCK FLUSH 100 UNIT/ML IV SOLN
500.0000 [IU] | Freq: Once | INTRAVENOUS | Status: AC | PRN
  Administered 2014-05-20: 500 [IU]
  Filled 2014-05-20: qty 5

## 2014-05-20 MED ORDER — DIPHENHYDRAMINE HCL 25 MG PO CAPS
ORAL_CAPSULE | ORAL | Status: AC
  Filled 2014-05-20: qty 2

## 2014-05-20 MED ORDER — TRASTUZUMAB CHEMO INJECTION 440 MG
6.0000 mg/kg | Freq: Once | INTRAVENOUS | Status: AC
  Administered 2014-05-20: 462 mg via INTRAVENOUS
  Filled 2014-05-20: qty 22

## 2014-05-20 MED ORDER — SODIUM CHLORIDE 0.9 % IJ SOLN
10.0000 mL | INTRAMUSCULAR | Status: DC | PRN
  Administered 2014-05-20: 10 mL
  Filled 2014-05-20: qty 10

## 2014-05-20 MED ORDER — SODIUM CHLORIDE 0.9 % IV SOLN
Freq: Once | INTRAVENOUS | Status: AC
  Administered 2014-05-20: 11:00:00 via INTRAVENOUS

## 2014-05-20 MED ORDER — ALRA NON-METALLIC DEODORANT (RAD-ONC)
1.0000 "application " | Freq: Once | TOPICAL | Status: AC
  Administered 2014-05-20: 1 via TOPICAL

## 2014-05-20 MED ORDER — ACETAMINOPHEN 325 MG PO TABS
650.0000 mg | ORAL_TABLET | Freq: Once | ORAL | Status: AC
  Administered 2014-05-20: 650 mg via ORAL

## 2014-05-20 MED ORDER — RADIAPLEXRX EX GEL
Freq: Once | CUTANEOUS | Status: AC
  Administered 2014-05-20: 16:00:00 via TOPICAL

## 2014-05-20 MED ORDER — DIPHENHYDRAMINE HCL 25 MG PO CAPS
50.0000 mg | ORAL_CAPSULE | Freq: Once | ORAL | Status: AC
  Administered 2014-05-20: 50 mg via ORAL

## 2014-05-20 NOTE — Patient Instructions (Signed)

## 2014-05-20 NOTE — Addendum Note (Signed)
Encounter addended by: Jenene Slicker, RN on: 05/20/2014  3:59 PM<BR>     Documentation filed: Visit Diagnoses, Inpatient MAR, Orders

## 2014-05-20 NOTE — Progress Notes (Signed)
[place]  [phone] [phone]) [phone]  ID: Kim [name] OB: 20-May-1935 MR#: [id number] QQP#:[id number] [name] Care Team: Laurey Morale, MD as PCP - General  DIAGNOSIS: Stage IIA (T1N1M0) adenocarcinoma of Kim left breast-triple positive  INTERVAL HISTORY: Kim [name] is back today for follow-up. Kim [name] has her good days and bad days. Kim [name] completed 4 cycles of therapy. Kim [name] actually did pretty well with them. Kim [name] had some nausea and vomiting at Kim beginning but we got this under better control.  Kim [name] did have a followup echocardiogram. This showed an ejection fraction of 65-70%. Kim [name] started radiation therapy last week and has had 4 treatments. Kim [name] has some diarrhea after her Herceptin treatments but is able to manage this with imodium. Kim [name] denies fever, chills, n/v, cough, rash, headache, dizziness, SOB, chest pain, palpitations, abdominal pain, constipation, blood in urine or stool. Kim [name] is achy and fatigued after her treatments. Kim [name] denies swelling or tenderness in her extremities. Her appetite is good and Kim [name] is drinking plenty of fluids. Her weight has remained stable. Kim [name] still has a little bit of neuropathy in her feet and it is tolerable.   CURRENT TREATMENT: Status post cycle 4 of Abraxane/Herceptin  Maintenance Herceptin  Radiation therapy started last week, has had 4 treatments Zometa 4 mg IV every 6 months  Femara 2.5 mg by mouth daily to start after radiation therapy is complete.  REVIEW OF SYSTEMS: All other 10 point review of systems is negative.   PAST MEDICAL HISTORY: Past Medical History  Diagnosis Date  . Allergy   . Allergic rhinitis   . Hypothyroidism   . Urinary tract infection   . Diverticulosis   . Hx of adenomatous colonic polyps 02/1999  . Tubulovillous adenoma of colon 12/2009    with HGD  . Low back pain     gets ESI from Dr. Rennis Harding  . Arthritis   . Hypertension   . HOH (hard of hearing)   . Wears hearing aid   . Breast  cancer of upper-outer quadrant of left female breast 11/08/13  . CAD (coronary artery disease)     a. 12/2013 Cath/PCI: LM nl, LAD 20p, 87m, 30d, D1 30p, LCX 20p, 67m, OM1 20, Mo2 40p, RCA 30p, 3m (4.0x16 Promus Premier DES), 20d, RPL 30, RPDA 70p, 87m, EF 65-70%.  . Hyperlipidemia    PAST SURGICAL HISTORY: Past Surgical History  Procedure Laterality Date  . Abdominal hysterectomy    . Bilateral salpingoophorectomy      see Laurin Coder NP for GYN exams  . Cataract extraction w/ intraocular lens  implant, bilateral  2012    bilateral  . Colonoscopy  01-14-12    per Dr. Fuller Plan, adenomatous polyps, repeat in 3 years   . Breast lumpectomy with needle localization and axillary lymph node dissection Left 11/05/2013    Procedure: LEFT BREAST LUMPECTOMY WITH NEEDLE LOCALIZATION and axillary lymph Node Dissection;  Surgeon: Edward Jolly, MD;  Location: [facility];  Service: General;  Laterality: Left;  . Breast surgery    . Portacath placement Right 12/11/2013    Procedure: INSERTION PORT-A-CATH;  Surgeon: Edward Jolly, MD;  Location: [facility];  Service: General;  Laterality: Right;  Subclavian Vein;    FAMILY HISTORY Family History  Problem Relation Age of Onset  . Alcohol abuse Father   . Kidney failure Mother   . Colon cancer      family   GYNECOLOGIC HISTORY:  [name]'s last  menstrual period was 10/19/1991.   SOCIAL HISTORY:  History   Social History  . Marital Status: Married    Spouse Name: N/A    Number of Children: 2  . Years of Education: N/A   Occupational History  . Retired-school system    Social History Main Topics  . Smoking status: Never Smoker   . Smokeless tobacco: Never Used     Comment: never used tobacco  . Alcohol Use: No  . Drug Use: No  . Sexual Activity: Not Currently   Other Topics Concern  . Not on file   Social History Narrative  . No narrative on file   ADVANCED DIRECTIVES: <no information>  HEALTH  MAINTENANCE: History  Substance Use Topics  . Smoking status: Never Smoker   . Smokeless tobacco: Never Used     Comment: never used tobacco  . Alcohol Use: No   Colonoscopy: PAP: Bone density: Lipid panel:  Allergies  Allergen Reactions  . Propoxyphene N-Acetaminophen Swelling    tongue swelling  . Penicillins Swelling    Facial swelling  . Cortisone Rash  . Pseudoephedrine Hcl Er Rash and Other (See Comments)    Face gets red   Current Outpatient Prescriptions  Medication Sig Dispense Refill  . acetaminophen (TYLENOL) 650 MG CR tablet Take 650 mg by mouth every 8 (eight) hours as needed.      Marland Kitchen acidophilus (RISAQUAD) CAPS capsule Take 1 capsule by mouth daily.      . Ascorbic Acid (VITAMIN C) 1000 MG tablet Take 1,000 mg by mouth daily.      Marland Kitchen aspirin EC 81 MG EC tablet Take 1 tablet (81 mg total) by mouth daily.      Marland Kitchen atorvastatin (LIPITOR) 10 MG tablet Take 1 tablet (10 mg total) by mouth daily at 6 PM.  30 tablet  6  . Calcium Citrate-Vitamin D (CITRACAL MAXIMUM PO) Take 1 tablet by mouth daily.       . cetirizine (ZYRTEC) 10 MG tablet Take 10 mg by mouth daily as needed for allergies.      Marland Kitchen clopidogrel (PLAVIX) 75 MG tablet Take 1 tablet (75 mg total) by mouth daily with breakfast.  30 tablet  6  . Cyanocobalamin (VITAMIN B-12) 2500 MCG SUBL Place under Kim tongue as needed.      Marland Kitchen dexamethasone (DECADRON) 4 MG tablet Take 2 tablets (8 mg total) by mouth as directed. 2 tabs in AM and 2 in PM day before chemo & 2 in Am and 2 in PM for 3 days after chemo.  30 tablet  5  . furosemide (LASIX) 20 MG tablet Take 1 tablet (20 mg total) by mouth daily. Take 1 tablet (20 mg total) every day x 3 days then take once daily as needed  30 tablet  0  . levothyroxine (SYNTHROID, LEVOTHROID) 125 MCG tablet Take 1 tablet (125 mcg total) by mouth daily before breakfast.  90 tablet  3  . lidocaine-prilocaine (EMLA) cream Apply topically as needed.  30 g  6  . Loperamide HCl (IMODIUM PO)  Take by mouth as needed.      . metoprolol succinate (TOPROL-XL) 50 MG 24 hr tablet Take 1 & 1/2 tabs daily  45 tablet  6  . Multiple Vitamins-Minerals (CENTRUM ADULTS PO) Take by mouth.      . nitroGLYCERIN (NITROSTAT) 0.4 MG SL tablet Place 1 tablet (0.4 mg total) under Kim tongue every 5 (five) minutes x 3 doses as needed for chest pain.  25 tablet  3  . [DISCONTINUED] ferrous sulfate 325 (65 FE) MG tablet Take 325 mg by mouth 2 (two) times daily.         No current facility-administered medications for this visit.   OBJECTIVE: Filed Vitals:   05/20/14 0939  BP: 141/71  Pulse: 70  Temp: 97.9 F (36.6 C)  Resp: 14   Body mass index is 25.4 kg/(m^2). ECOG FS:0 - Asymptomatic Ocular: Sclerae unicteric, pupils equal, round and reactive to light Ear-nose-throat: Oropharynx clear, dentition fair Lymphatic: No cervical or supraclavicular adenopathy Lungs no rales or rhonchi, good excursion bilaterally Heart regular rate and rhythm, no murmur appreciated Abd soft, nontender, positive bowel sounds MSK no focal spinal tenderness, no joint edema Neuro: non-focal, well-oriented, appropriate affect Breasts: No changes, no lymphedema   LAB RESULTS: CMP     Component Value Date/Time   NA 140 05/20/2014 0846   NA 135 04/18/2014 1319   NA 137 01/28/2014 1354   K 3.8 05/20/2014 0846   K 4.0 04/18/2014 1319   K 4.3 01/28/2014 1354   CL 104 05/20/2014 0846   CL 105 04/18/2014 1319   CO2 27 05/20/2014 0846   CO2 20 04/18/2014 1319   CO2 21* 01/28/2014 1354   GLUCOSE 91 05/20/2014 0846   GLUCOSE 166* 04/18/2014 1319   GLUCOSE 156* 01/28/2014 1354   BUN 19 05/20/2014 0846   BUN 35* 04/18/2014 1319   BUN 35.3* 01/28/2014 1354   CREATININE 0.9 05/20/2014 0846   CREATININE 1.04 04/18/2014 1319   CREATININE 1.3* 01/28/2014 1354   CALCIUM 8.8 05/20/2014 0846   CALCIUM 9.2 04/18/2014 1319   CALCIUM 9.4 01/28/2014 1354   PROT 6.7 05/20/2014 0846   PROT 6.0 04/18/2014 1319   PROT 6.7 01/28/2014 1354   ALBUMIN 3.7  04/18/2014 1319   ALBUMIN 3.7 01/28/2014 1354   AST 17 05/20/2014 0846   AST 16 04/18/2014 1319   AST 10 01/28/2014 1354   ALT 16 05/20/2014 0846   ALT 19 04/18/2014 1319   ALT 13 01/28/2014 1354   ALKPHOS 51 05/20/2014 0846   ALKPHOS 59 04/18/2014 1319   ALKPHOS 63 01/28/2014 1354   BILITOT 0.80 05/20/2014 0846   BILITOT 0.8 04/18/2014 1319   BILITOT 0.76 01/28/2014 1354   GFRNONAA 65* 12/20/2013 0154   GFRAA 76* 12/20/2013 0154   No results found for this basename: SPEP, UPEP,  kappa and lambda light chains   Lab Results  Component Value Date   WBC 4.2 05/20/2014   NEUTROABS 2.7 05/20/2014   HGB 11.1* 05/20/2014   HCT 34.3* 05/20/2014   MCV 93 05/20/2014   PLT 156 05/20/2014   No results found for this basename: LABCA2   No components found with this basename: FIEPP295   No results found for this basename: INR,  in Kim last 168 hours  STUDIES: No results found.  ASSESSMENT/PLAN: Kim [name] is 79 year old white female with stage IIA ductal carcinoma of Kim left breast. Kim [name] had 4 positive lymph nodes. Her breast cancer was triple positive. Kim [name] completed her adjuvant chemotherapy. Kim [name] completed this in July of 2015.  Kim [name] is now receiving maintenance therapy with Herceptin for one year. Today is Day 1 Cycle 6. This will be given every 3 weeks. Her cardiac function is very good. Kim [name] will need another need another echocardiogram probably in October.  Kim [name] will receive Zometa twice a year.  We will not start Femara until after radiation is completed.  Her labs looked good today. Kim [name]  has her chemo schedule and will also see Dr. Marin Olp in 3 weeks for follow-up. Kim [name] is in agreement with this and knows to call with any questions or concerns. We can certainly see her sooner if need be.   Eliezer Bottom, NP 05/20/2014 10:24 AM

## 2014-05-20 NOTE — Progress Notes (Signed)
She is currently in no pain. Does experience pain over bilateral knees.  Pt complains of, Chills, Fatigue and Generalized Weakness, dizziness.  Pt left breast skin is warm dry and intact. Radiaplex and alra deodorant with instructions.

## 2014-05-20 NOTE — Progress Notes (Signed)
Weekly Management Note:  Site: Left breast/regional lymph nodes Current Dose:  1000  cGy Projected Dose: 5000  cGy  Narrative: The patient is seen today for routine under treatment assessment. CBCT/MVCT images/port films were reviewed. The chart was reviewed.   She is without complaints today. She had patient education today. She has Radioplex gel to use when necessary.  Physical Examination:  Filed Vitals:   05/20/14 1521  BP: 136/63  Pulse: 64  Temp: 97.8 F (36.6 C)  Resp: 12  .  Weight: 169 lb 14.4 oz (77.066 kg). No significant skin changes.  Impression: Tolerating radiation therapy well.  Plan: Continue radiation therapy as planned.

## 2014-05-21 ENCOUNTER — Ambulatory Visit
Admission: RE | Admit: 2014-05-21 | Discharge: 2014-05-21 | Disposition: A | Discharge status: Home / Self Care | Payer: Medicare Other | Source: Ambulatory Visit | Attending: Radiation Oncology | Admitting: Radiation Oncology

## 2014-05-21 DIAGNOSIS — Z510 Encounter for antineoplastic radiation therapy: Secondary | ICD-10-CM | POA: Diagnosis not present

## 2014-05-22 ENCOUNTER — Ambulatory Visit
Admission: RE | Admit: 2014-05-22 | Discharge: 2014-05-22 | Disposition: A | Discharge status: Home / Self Care | Payer: Medicare Other | Source: Ambulatory Visit | Attending: Radiation Oncology | Admitting: Radiation Oncology

## 2014-05-22 DIAGNOSIS — Z510 Encounter for antineoplastic radiation therapy: Secondary | ICD-10-CM | POA: Diagnosis not present

## 2014-05-23 ENCOUNTER — Ambulatory Visit
Admission: RE | Admit: 2014-05-23 | Discharge: 2014-05-23 | Disposition: A | Discharge status: Home / Self Care | Payer: Medicare Other | Source: Ambulatory Visit | Attending: Radiation Oncology | Admitting: Radiation Oncology

## 2014-05-23 DIAGNOSIS — Z510 Encounter for antineoplastic radiation therapy: Secondary | ICD-10-CM | POA: Diagnosis not present

## 2014-05-24 ENCOUNTER — Ambulatory Visit
Admission: RE | Admit: 2014-05-24 | Discharge: 2014-05-24 | Disposition: A | Discharge status: Home / Self Care | Payer: Medicare Other | Source: Ambulatory Visit | Attending: Radiation Oncology | Admitting: Radiation Oncology

## 2014-05-24 DIAGNOSIS — Z510 Encounter for antineoplastic radiation therapy: Secondary | ICD-10-CM | POA: Diagnosis not present

## 2014-05-27 ENCOUNTER — Encounter: Payer: Self-pay | Admitting: Radiation Oncology

## 2014-05-27 ENCOUNTER — Ambulatory Visit
Admission: RE | Admit: 2014-05-27 | Discharge: 2014-05-27 | Disposition: A | Discharge status: Home / Self Care | Payer: Medicare Other | Source: Ambulatory Visit | Attending: Radiation Oncology | Admitting: Radiation Oncology

## 2014-05-27 VITALS — BP 155/81 | HR 62 | Temp 97.6°F | Ht 68.0 in | Wt 168.1 lb

## 2014-05-27 DIAGNOSIS — Z510 Encounter for antineoplastic radiation therapy: Secondary | ICD-10-CM | POA: Diagnosis not present

## 2014-05-27 DIAGNOSIS — C50412 Malignant neoplasm of upper-outer quadrant of left female breast: Secondary | ICD-10-CM

## 2014-05-27 NOTE — Addendum Note (Signed)
Encounter addended by: Deirdre Evener, RN on: 05/27/2014  6:38 PM<BR>     Documentation filed: Notes Section, Inpatient Patient Education

## 2014-05-27 NOTE — Progress Notes (Signed)
   Weekly Management Note:  outpatient    ICD-9-CM ICD-10-CM  1. Breast cancer of upper-outer quadrant of left female breast 174.4 C50.412    Current Dose:  20 Gy  Projected Dose: 60 Gy   Narrative:  The patient presents for routine under treatment assessment.  CBCT/MVCT images/Port film x-rays were reviewed.  The chart was checked. No complaints  Physical Findings:  height is 5\' 8"  (1.727 m) and weight is 168 lb 1.6 oz (76.25 kg). Her temperature is 97.6 F (36.4 C). Her blood pressure is 155/81 and her pulse is 62.  NAD, L breast skin intact, slightly hyperpigmented  Impression:  The patient is tolerating radiotherapy.  Plan:  Continue radiotherapy as planned.   ________________________________   Eppie Gibson, M.D.

## 2014-05-27 NOTE — Progress Notes (Addendum)
Kim [name] has received 10 fractions to her left breast.  Her breast is without any irritation.  Reviewed skin care to ensure she was applying  Radiplex Gel to the skin on her left breast, and she stated compliance.  Daughter present at this time.

## 2014-05-28 ENCOUNTER — Ambulatory Visit
Admission: RE | Admit: 2014-05-28 | Discharge: 2014-05-28 | Disposition: A | Discharge status: Home / Self Care | Payer: Medicare Other | Source: Ambulatory Visit | Attending: Radiation Oncology | Admitting: Radiation Oncology

## 2014-05-28 DIAGNOSIS — Z510 Encounter for antineoplastic radiation therapy: Secondary | ICD-10-CM | POA: Diagnosis not present

## 2014-05-29 ENCOUNTER — Ambulatory Visit
Admission: RE | Admit: 2014-05-29 | Discharge: 2014-05-29 | Disposition: A | Discharge status: Home / Self Care | Payer: Medicare Other | Source: Ambulatory Visit | Attending: Radiation Oncology | Admitting: Radiation Oncology

## 2014-05-29 DIAGNOSIS — Z510 Encounter for antineoplastic radiation therapy: Secondary | ICD-10-CM | POA: Diagnosis not present

## 2014-05-30 ENCOUNTER — Ambulatory Visit: Payer: Medicare Other

## 2014-05-31 ENCOUNTER — Ambulatory Visit
Admission: RE | Admit: 2014-05-31 | Discharge: 2014-05-31 | Disposition: A | Discharge status: Home / Self Care | Payer: Medicare Other | Source: Ambulatory Visit | Attending: Radiation Oncology | Admitting: Radiation Oncology

## 2014-05-31 DIAGNOSIS — Z510 Encounter for antineoplastic radiation therapy: Secondary | ICD-10-CM | POA: Diagnosis not present

## 2014-06-03 ENCOUNTER — Ambulatory Visit
Admission: RE | Admit: 2014-06-03 | Discharge: 2014-06-03 | Disposition: A | Discharge status: Home / Self Care | Payer: Medicare Other | Source: Ambulatory Visit | Attending: Radiation Oncology | Admitting: Radiation Oncology

## 2014-06-03 ENCOUNTER — Telehealth: Payer: Self-pay | Admitting: Family Medicine

## 2014-06-03 ENCOUNTER — Encounter: Payer: Self-pay | Admitting: Radiation Oncology

## 2014-06-03 VITALS — BP 131/81 | HR 67 | Temp 98.2°F | Resp 20 | Wt 169.0 lb

## 2014-06-03 DIAGNOSIS — C50412 Malignant neoplasm of upper-outer quadrant of left female breast: Secondary | ICD-10-CM

## 2014-06-03 DIAGNOSIS — Z510 Encounter for antineoplastic radiation therapy: Secondary | ICD-10-CM | POA: Diagnosis not present

## 2014-06-03 NOTE — Telephone Encounter (Signed)
Pt was seen on 04-19-14 and needs blood work results

## 2014-06-03 NOTE — Progress Notes (Signed)
   Weekly Management Note:  outpatient    ICD-9-CM ICD-10-CM  1. Breast cancer of upper-outer quadrant of left female breast 174.4 C50.412    Current Dose:  28 Gy  Projected Dose: 60 Gy   Narrative:  The patient presents for routine under treatment assessment.  CBCT/MVCT images/Port film x-rays were reviewed.  The chart was checked. Minimal skin irritation  Physical Findings:  weight is 169 lb (76.658 kg). Her temperature is 98.2 F (36.8 C). Her blood pressure is 131/81 and her pulse is 67. Her respiration is 20.  minimal erythema over left chest, skin intact  Impression:  The patient is tolerating radiotherapy.  Plan:  Continue radiotherapy as planned.   ________________________________   Eppie Gibson, M.D.

## 2014-06-03 NOTE — Telephone Encounter (Signed)
I left a voice message with lab results from the below date.

## 2014-06-03 NOTE — Progress Notes (Signed)
Patient denies pain, loss of appetite. She is fatigued but states it is "getting better". Pt applying Radiaplex to left breast treatment area, denies skin changes at this time.

## 2014-06-04 ENCOUNTER — Ambulatory Visit
Admission: RE | Admit: 2014-06-04 | Discharge: 2014-06-04 | Disposition: A | Discharge status: Home / Self Care | Payer: Medicare Other | Source: Ambulatory Visit | Attending: Radiation Oncology | Admitting: Radiation Oncology

## 2014-06-04 DIAGNOSIS — Z510 Encounter for antineoplastic radiation therapy: Secondary | ICD-10-CM | POA: Diagnosis not present

## 2014-06-05 ENCOUNTER — Ambulatory Visit
Admission: RE | Admit: 2014-06-05 | Discharge: 2014-06-05 | Disposition: A | Discharge status: Home / Self Care | Payer: Medicare Other | Source: Ambulatory Visit | Attending: Radiation Oncology | Admitting: Radiation Oncology

## 2014-06-05 DIAGNOSIS — Z510 Encounter for antineoplastic radiation therapy: Secondary | ICD-10-CM | POA: Diagnosis not present

## 2014-06-06 ENCOUNTER — Ambulatory Visit
Admission: RE | Admit: 2014-06-06 | Discharge: 2014-06-06 | Disposition: A | Discharge status: Home / Self Care | Payer: Medicare Other | Source: Ambulatory Visit | Attending: Radiation Oncology | Admitting: Radiation Oncology

## 2014-06-06 DIAGNOSIS — Z510 Encounter for antineoplastic radiation therapy: Secondary | ICD-10-CM | POA: Diagnosis not present

## 2014-06-07 ENCOUNTER — Ambulatory Visit
Admission: RE | Admit: 2014-06-07 | Discharge: 2014-06-07 | Disposition: A | Discharge status: Home / Self Care | Payer: Medicare Other | Source: Ambulatory Visit | Attending: Radiation Oncology | Admitting: Radiation Oncology

## 2014-06-07 DIAGNOSIS — Z510 Encounter for antineoplastic radiation therapy: Secondary | ICD-10-CM | POA: Diagnosis not present

## 2014-06-11 ENCOUNTER — Ambulatory Visit (HOSPITAL_BASED_OUTPATIENT_CLINIC_OR_DEPARTMENT_OTHER): Payer: Medicare Other | Admitting: Hematology & Oncology

## 2014-06-11 ENCOUNTER — Ambulatory Visit
Admission: RE | Admit: 2014-06-11 | Discharge: 2014-06-11 | Disposition: A | Discharge status: Home / Self Care | Payer: Medicare Other | Source: Ambulatory Visit | Attending: Radiation Oncology | Admitting: Radiation Oncology

## 2014-06-11 ENCOUNTER — Ambulatory Visit (HOSPITAL_BASED_OUTPATIENT_CLINIC_OR_DEPARTMENT_OTHER): Payer: Medicare Other

## 2014-06-11 ENCOUNTER — Encounter: Payer: Self-pay | Admitting: Hematology & Oncology

## 2014-06-11 ENCOUNTER — Other Ambulatory Visit (HOSPITAL_BASED_OUTPATIENT_CLINIC_OR_DEPARTMENT_OTHER): Payer: Medicare Other | Admitting: Lab

## 2014-06-11 VITALS — BP 130/71 | HR 73 | Temp 98.0°F | Resp 14 | Ht 68.0 in | Wt 169.0 lb

## 2014-06-11 DIAGNOSIS — Z5112 Encounter for antineoplastic immunotherapy: Secondary | ICD-10-CM

## 2014-06-11 DIAGNOSIS — C50419 Malignant neoplasm of upper-outer quadrant of unspecified female breast: Secondary | ICD-10-CM

## 2014-06-11 DIAGNOSIS — C50912 Malignant neoplasm of unspecified site of left female breast: Secondary | ICD-10-CM

## 2014-06-11 DIAGNOSIS — Z170 Estrogen receptor positive status [ER+]: Secondary | ICD-10-CM

## 2014-06-11 DIAGNOSIS — C50412 Malignant neoplasm of upper-outer quadrant of left female breast: Secondary | ICD-10-CM

## 2014-06-11 DIAGNOSIS — C50919 Malignant neoplasm of unspecified site of unspecified female breast: Secondary | ICD-10-CM

## 2014-06-11 DIAGNOSIS — Z510 Encounter for antineoplastic radiation therapy: Secondary | ICD-10-CM | POA: Diagnosis not present

## 2014-06-11 LAB — CMP (CANCER CENTER ONLY)
ALT(SGPT): 13 U/L (ref 10–47)
AST: 15 U/L (ref 11–38)
Albumin: 3.8 g/dL (ref 3.3–5.5)
Alkaline Phosphatase: 48 U/L (ref 26–84)
BUN, Bld: 20 mg/dL (ref 7–22)
CO2: 25 mEq/L (ref 18–33)
Calcium: 9.3 mg/dL (ref 8.0–10.3)
Chloride: 102 mEq/L (ref 98–108)
Creat: 0.9 mg/dl (ref 0.6–1.2)
Glucose, Bld: 134 mg/dL — ABNORMAL HIGH (ref 73–118)
Potassium: 3.7 mEq/L (ref 3.3–4.7)
Sodium: 141 mEq/L (ref 128–145)
Total Bilirubin: 0.9 mg/dl (ref 0.20–1.60)
Total Protein: 6.9 g/dL (ref 6.4–8.1)

## 2014-06-11 LAB — CBC WITH DIFFERENTIAL (CANCER CENTER ONLY)
BASO#: 0 10*3/uL (ref 0.0–0.2)
BASO%: 0.4 % (ref 0.0–2.0)
EOS%: 0 % (ref 0.0–7.0)
Eosinophils Absolute: 0 10*3/uL (ref 0.0–0.5)
HCT: 35.3 % (ref 34.8–46.6)
HGB: 11.7 g/dL (ref 11.6–15.9)
LYMPH#: 0.3 10*3/uL — ABNORMAL LOW (ref 0.9–3.3)
LYMPH%: 4 % — ABNORMAL LOW (ref 14.0–48.0)
MCH: 29.7 pg (ref 26.0–34.0)
MCHC: 33.1 g/dL (ref 32.0–36.0)
MCV: 90 fL (ref 81–101)
MONO#: 0.3 10*3/uL (ref 0.1–0.9)
MONO%: 3.9 % (ref 0.0–13.0)
NEUT#: 7.3 10*3/uL — ABNORMAL HIGH (ref 1.5–6.5)
NEUT%: 91.7 % — ABNORMAL HIGH (ref 39.6–80.0)
Platelets: 127 10*3/uL — ABNORMAL LOW (ref 145–400)
RBC: 3.94 10*6/uL (ref 3.70–5.32)
RDW: 14.5 % (ref 11.1–15.7)
WBC: 7.9 10*3/uL (ref 3.9–10.0)

## 2014-06-11 MED ORDER — SODIUM CHLORIDE 0.9 % IJ SOLN
10.0000 mL | INTRAMUSCULAR | Status: DC | PRN
  Administered 2014-06-11: 10 mL
  Filled 2014-06-11: qty 10

## 2014-06-11 MED ORDER — SODIUM CHLORIDE 0.9 % IV SOLN
6.0000 mg/kg | Freq: Once | INTRAVENOUS | Status: AC
  Administered 2014-06-11: 462 mg via INTRAVENOUS
  Filled 2014-06-11: qty 22

## 2014-06-11 MED ORDER — HEPARIN SOD (PORK) LOCK FLUSH 100 UNIT/ML IV SOLN
500.0000 [IU] | Freq: Once | INTRAVENOUS | Status: AC | PRN
  Administered 2014-06-11: 500 [IU]
  Filled 2014-06-11: qty 5

## 2014-06-11 MED ORDER — SODIUM CHLORIDE 0.9 % IV SOLN
Freq: Once | INTRAVENOUS | Status: AC
  Administered 2014-06-11: 11:00:00 via INTRAVENOUS

## 2014-06-11 MED ORDER — ACETAMINOPHEN 325 MG PO TABS
650.0000 mg | ORAL_TABLET | Freq: Once | ORAL | Status: AC
  Administered 2014-06-11: 650 mg via ORAL

## 2014-06-11 MED ORDER — ACETAMINOPHEN 325 MG PO TABS
ORAL_TABLET | ORAL | Status: AC
  Filled 2014-06-11: qty 2

## 2014-06-11 MED ORDER — DIPHENHYDRAMINE HCL 25 MG PO CAPS
50.0000 mg | ORAL_CAPSULE | Freq: Once | ORAL | Status: AC
Start: 2014-06-11 — End: 2014-06-11
  Administered 2014-06-11: 50 mg via ORAL

## 2014-06-11 MED ORDER — DIPHENHYDRAMINE HCL 25 MG PO CAPS
ORAL_CAPSULE | ORAL | Status: AC
  Filled 2014-06-11: qty 2

## 2014-06-11 NOTE — Patient Instructions (Signed)

## 2014-06-12 ENCOUNTER — Encounter: Payer: Self-pay | Admitting: Radiation Oncology

## 2014-06-12 ENCOUNTER — Ambulatory Visit
Admission: RE | Admit: 2014-06-12 | Discharge: 2014-06-12 | Disposition: A | Discharge status: Home / Self Care | Payer: Medicare Other | Source: Ambulatory Visit | Attending: Radiation Oncology | Admitting: Radiation Oncology

## 2014-06-12 VITALS — BP 132/75 | HR 65 | Temp 97.7°F | Ht 68.0 in | Wt 171.3 lb

## 2014-06-12 DIAGNOSIS — Z510 Encounter for antineoplastic radiation therapy: Secondary | ICD-10-CM | POA: Diagnosis not present

## 2014-06-12 DIAGNOSIS — C50412 Malignant neoplasm of upper-outer quadrant of left female breast: Secondary | ICD-10-CM

## 2014-06-12 NOTE — Progress Notes (Signed)
   Weekly Management Note:  Outpatient    ICD-9-CM ICD-10-CM  1. Breast cancer of upper-outer quadrant of left female breast 174.4 C50.412    Current Dose:  40 Gy  Projected Dose: 60 Gy   Narrative:  The patient presents for routine under treatment assessment.  CBCT/MVCT images/Port film x-rays were reviewed.  The chart was checked. Tired.   Physical Findings:  height is 5\' 8"  (1.727 m) and weight is 171 lb 4.8 oz (77.701 kg). Her temperature is 97.7 F (36.5 C). Her blood pressure is 132/75 and her pulse is 65.  minimal skin changes over left breast/ lower neck  Impression:  The patient is tolerating radiotherapy.  Plan:  Continue radiotherapy as planned.    ________________________________   Eppie Gibson, M.D.

## 2014-06-12 NOTE — Progress Notes (Signed)
Kim [name] has received 20 fractions to her left breast.  She denies any pain and her skin is without any redness nor tanning in the treatment field. She received Herceptin on yesterday and states she feels weak since then.  VSS.

## 2014-06-13 ENCOUNTER — Ambulatory Visit
Admission: RE | Admit: 2014-06-13 | Discharge: 2014-06-13 | Disposition: A | Discharge status: Home / Self Care | Payer: Medicare Other | Source: Ambulatory Visit | Attending: Radiation Oncology | Admitting: Radiation Oncology

## 2014-06-13 DIAGNOSIS — Z510 Encounter for antineoplastic radiation therapy: Secondary | ICD-10-CM | POA: Diagnosis not present

## 2014-06-14 ENCOUNTER — Ambulatory Visit
Admission: RE | Admit: 2014-06-14 | Discharge: 2014-06-14 | Disposition: A | Discharge status: Home / Self Care | Payer: Medicare Other | Source: Ambulatory Visit | Attending: Radiation Oncology | Admitting: Radiation Oncology

## 2014-06-14 DIAGNOSIS — Z510 Encounter for antineoplastic radiation therapy: Secondary | ICD-10-CM | POA: Diagnosis not present

## 2014-06-17 ENCOUNTER — Ambulatory Visit: Payer: Medicare Other

## 2014-06-17 ENCOUNTER — Encounter: Payer: Self-pay | Admitting: Radiation Oncology

## 2014-06-17 ENCOUNTER — Ambulatory Visit: Payer: Medicare Other | Admitting: Radiation Oncology

## 2014-06-17 ENCOUNTER — Ambulatory Visit
Admission: RE | Admit: 2014-06-17 | Discharge: 2014-06-17 | Disposition: A | Discharge status: Home / Self Care | Payer: Medicare Other | Source: Ambulatory Visit | Attending: Radiation Oncology | Admitting: Radiation Oncology

## 2014-06-17 VITALS — BP 138/82 | HR 70 | Temp 98.2°F | Ht 68.0 in | Wt 169.9 lb

## 2014-06-17 DIAGNOSIS — Z510 Encounter for antineoplastic radiation therapy: Secondary | ICD-10-CM | POA: Diagnosis not present

## 2014-06-17 DIAGNOSIS — C50412 Malignant neoplasm of upper-outer quadrant of left female breast: Secondary | ICD-10-CM

## 2014-06-17 NOTE — Progress Notes (Signed)
Kim [name] has completed 23 fractions to her left breast and subclavian area.  She denies pain.  She reports feeling tired only after her herceptin injections which she had last Monday.  The skin on her left subclavian area and left breast are red with a scattered rash.  She reports itching occasionally.  She is using radiaplex gel twice a day.

## 2014-06-17 NOTE — Progress Notes (Signed)
   Weekly Management Note:  Outpatient    ICD-9-CM ICD-10-CM  1. Breast cancer of upper-outer quadrant of left female breast 174.4 C50.412    Current Dose:  46 Gy  Projected Dose: 60 Gy   Narrative:  The patient presents for routine under treatment assessment.  CBCT/MVCT images/Port film x-rays were reviewed.  The chart was checked. NAD, minimal complaints.  Soreness of skin in left SCV region  Physical Findings:  height is 5\' 8"  (1.727 m) and weight is 169 lb 14.4 oz (77.066 kg). Her oral temperature is 98.2 F (36.8 C). Her blood pressure is 138/82 and her pulse is 70.  NAD. Left SCV region erythematous, dry. Skin otherwise looks excellent  Impression:  The patient is tolerating radiotherapy.  Plan:  Continue radiotherapy as planned. Radiaplex TID.  ________________________________   Eppie Gibson, M.D.

## 2014-06-18 ENCOUNTER — Ambulatory Visit: Payer: Medicare Other

## 2014-06-18 ENCOUNTER — Ambulatory Visit
Admission: RE | Admit: 2014-06-18 | Discharge: 2014-06-18 | Disposition: A | Discharge status: Home / Self Care | Payer: Medicare Other | Source: Ambulatory Visit | Attending: Radiation Oncology | Admitting: Radiation Oncology

## 2014-06-18 DIAGNOSIS — Z510 Encounter for antineoplastic radiation therapy: Secondary | ICD-10-CM | POA: Diagnosis not present

## 2014-06-18 NOTE — Progress Notes (Signed)
Hematology and Oncology Follow Up Visit  Kim [name] [id number] [birth year] 79 y.o. 06/18/2014   Principle Diagnosis:  Stage IIA (T1N1M0) adenocarcinoma of the left breast-triple positive  Current Therapy:   Status post cycle 4 of Abraxane/Herceptin Maintenance Herceptin q 3wk Radiation therapy to the left breast Zometa 4 mg IV every 6 months Femara 2.5 mg by mouth daily to start after radiation therapy is complete.     Interim History:  Kim [name] is back for followup. She's radiation therapy. She is about halfway through. She really has had very few side effects. She is tired. She's had no nausea vomiting. She's had no headache.  She's doing well with the Herceptin. She's had no complications from this. She did have a followup echocardiogram. This was done in July. This showed an ejection fraction of 65-70%.  She does have some slight swelling in her legs. She has a little bit of neuropathy in her feet.  Her appetite has been good. She's worried about gaining some weight. She has a performance status of ECOG 1.    Medications: Current outpatient prescriptions:acetaminophen (TYLENOL) 650 MG CR tablet, Take 650 mg by mouth every 8 (eight) hours as needed., Disp: , Rfl: ;  acidophilus (RISAQUAD) CAPS capsule, Take 1 capsule by mouth daily., Disp: , Rfl: ;  Ascorbic Acid (VITAMIN C) 1000 MG tablet, Take 1,000 mg by mouth daily., Disp: , Rfl: ;  aspirin EC 81 MG EC tablet, Take 1 tablet (81 mg total) by mouth daily., Disp: , Rfl:  atorvastatin (LIPITOR) 10 MG tablet, Take 1 tablet (10 mg total) by mouth daily at 6 PM., Disp: 30 tablet, Rfl: 6;  Calcium Citrate-Vitamin D (CITRACAL MAXIMUM PO), Take 1 tablet by mouth daily. , Disp: , Rfl: ;  cetirizine (ZYRTEC) 10 MG tablet, Take 10 mg by mouth daily as needed for allergies., Disp: , Rfl: ;  clopidogrel (PLAVIX) 75 MG tablet, Take 1 tablet (75 mg total) by mouth daily with breakfast., Disp: 30 tablet, Rfl: 6 Cyanocobalamin (VITAMIN B-12)  2500 MCG SUBL, Place under the tongue as needed., Disp: , Rfl: ;  dexamethasone (DECADRON) 4 MG tablet, Take 2 tablets (8 mg total) by mouth as directed. 2 tabs in AM and 2 in PM day before chemo & 2 in Am and 2 in PM for 3 days after chemo., Disp: 30 tablet, Rfl: 5 furosemide (LASIX) 20 MG tablet, Take 1 tablet (20 mg total) by mouth daily. Take 1 tablet (20 mg total) every day x 3 days then take once daily as needed, Disp: 30 tablet, Rfl: 0;  levothyroxine (SYNTHROID, LEVOTHROID) 125 MCG tablet, Take 1 tablet (125 mcg total) by mouth daily before breakfast., Disp: 90 tablet, Rfl: 3;  lidocaine-prilocaine (EMLA) cream, Apply topically as needed., Disp: 30 g, Rfl: 6 Loperamide HCl (IMODIUM PO), Take by mouth as needed., Disp: , Rfl: ;  metoprolol succinate (TOPROL-XL) 50 MG 24 hr tablet, Take 1 & 1/2 tabs daily, Disp: 45 tablet, Rfl: 6;  Multiple Vitamins-Minerals (CENTRUM ADULTS PO), Take by mouth., Disp: , Rfl: ;  nitroGLYCERIN (NITROSTAT) 0.4 MG SL tablet, Place 1 tablet (0.4 mg total) under the tongue every 5 (five) minutes x 3 doses as needed for chest pain., Disp: 25 tablet, Rfl: 3 [DISCONTINUED] ferrous sulfate 325 (65 FE) MG tablet, Take 325 mg by mouth 2 (two) times daily.  , Disp: , Rfl:   Allergies:  Allergies  Allergen Reactions  . Propoxyphene N-Acetaminophen Swelling    tongue swelling  .  Penicillins Swelling    Facial swelling  . Cortisone Rash  . Pseudoephedrine Hcl Er Rash and Other (See Comments)    Face gets red    Past Medical History, Surgical history, Social history, and Family History were reviewed and updated.  Review of Systems: As above  Physical Exam:  height is 5\' 8"  (1.727 m) and weight is 169 lb (76.658 kg). Her oral temperature is 98 F (36.7 C). Her blood pressure is 130/71 and her pulse is 73. Her respiration is 14.   Well-developed and well-nourished white female. Her head and neck exam shows no ocular or oral lesions. She has no palpable cervical or  supraclavicular lymph nodes. Lungs are clear bilaterally. Cardiac exam regular rate and rhythm with a 2/6 systolic ejection murmur. Breast exam shows right breast with no masses edema or erythema. There is no right axillary adenopathy. Left breast shows well-healed lumpectomy at the 2:00 position. There might be a little bit of erythema. The might be a little bit of swelling. There is no tenderness at the lumpectomy site. There is no masses in the left breast. There is no left axillary adenopathy.. Abdomen is soft. She's good bowel sounds. There is no palpable liver or spleen tip. Back exam shows no tenderness over the spine ribs or hips. Extremities shows no clubbing cyanosis or edema. Neurological exam shows no focal neurological deficits.  Lab Results  Component Value Date   WBC 7.9 06/11/2014   HGB 11.7 06/11/2014   HCT 35.3 06/11/2014   MCV 90 06/11/2014   PLT 127* 06/11/2014     Chemistry      Component Value Date/Time   NA 141 06/11/2014 0841   NA 135 04/18/2014 1319   NA 137 01/28/2014 1354   K 3.7 06/11/2014 0841   K 4.0 04/18/2014 1319   K 4.3 01/28/2014 1354   CL 102 06/11/2014 0841   CL 105 04/18/2014 1319   CO2 25 06/11/2014 0841   CO2 20 04/18/2014 1319   CO2 21* 01/28/2014 1354   BUN 20 06/11/2014 0841   BUN 35* 04/18/2014 1319   BUN 35.3* 01/28/2014 1354   CREATININE 0.9 06/11/2014 0841   CREATININE 1.04 04/18/2014 1319   CREATININE 1.3* 01/28/2014 1354      Component Value Date/Time   CALCIUM 9.3 06/11/2014 0841   CALCIUM 9.2 04/18/2014 1319   CALCIUM 9.4 01/28/2014 1354   ALKPHOS 48 06/11/2014 0841   ALKPHOS 59 04/18/2014 1319   ALKPHOS 63 01/28/2014 1354   AST 15 06/11/2014 0841   AST 16 04/18/2014 1319   AST 10 01/28/2014 1354   ALT 13 06/11/2014 0841   ALT 19 04/18/2014 1319   ALT 13 01/28/2014 1354   BILITOT 0.90 06/11/2014 0841   BILITOT 0.8 04/18/2014 1319   BILITOT 0.76 01/28/2014 1354         Impression and Plan: Kim [name] is 79 year old white female with stage IIA ductal carcinoma of the  left breast. She had 4 positive lymph nodes. Her breast cancer was triple positive. She completed her adjuvant chemotherapy. She completed this in July of 2015.  She's doing well. There is up is doing okay. She's had no problems with the radiation therapy. I also do  think that some Zometa will help. I think twice a year therapy with Zometa is worthwhile.  We will not start Femara until after radiation is completed.  I spent about 35 minutes with she and her husband. I answered all of their questions. I  explained to Kim [name] how the remainder of her therapy will be. I explained why everything was being done as it is scheduled. I explained why a year of Herceptin was necessary.  I want to see her back in 3 weeks. We will continue to follow her closely during the radiation therapy while she is getting the Herceptin.   [name], MD 9/15/20157:12 AM

## 2014-06-19 ENCOUNTER — Ambulatory Visit
Admission: RE | Admit: 2014-06-19 | Discharge: 2014-06-19 | Disposition: A | Discharge status: Home / Self Care | Payer: Medicare Other | Source: Ambulatory Visit | Attending: Radiation Oncology | Admitting: Radiation Oncology

## 2014-06-19 ENCOUNTER — Ambulatory Visit: Payer: Medicare Other

## 2014-06-19 DIAGNOSIS — Z510 Encounter for antineoplastic radiation therapy: Secondary | ICD-10-CM | POA: Diagnosis not present

## 2014-06-20 ENCOUNTER — Encounter: Payer: Self-pay | Admitting: Gastroenterology

## 2014-06-20 ENCOUNTER — Ambulatory Visit
Admission: RE | Admit: 2014-06-20 | Discharge: 2014-06-20 | Disposition: A | Discharge status: Home / Self Care | Payer: Medicare Other | Source: Ambulatory Visit | Attending: Radiation Oncology | Admitting: Radiation Oncology

## 2014-06-20 ENCOUNTER — Ambulatory Visit: Payer: Medicare Other

## 2014-06-20 DIAGNOSIS — Z510 Encounter for antineoplastic radiation therapy: Secondary | ICD-10-CM | POA: Diagnosis not present

## 2014-06-21 ENCOUNTER — Ambulatory Visit
Admission: RE | Admit: 2014-06-21 | Discharge: 2014-06-21 | Disposition: A | Discharge status: Home / Self Care | Payer: Medicare Other | Source: Ambulatory Visit | Attending: Radiation Oncology | Admitting: Radiation Oncology

## 2014-06-21 DIAGNOSIS — Z510 Encounter for antineoplastic radiation therapy: Secondary | ICD-10-CM | POA: Diagnosis not present

## 2014-06-24 ENCOUNTER — Encounter: Payer: Self-pay | Admitting: Radiation Oncology

## 2014-06-24 ENCOUNTER — Ambulatory Visit
Admission: RE | Admit: 2014-06-24 | Discharge: 2014-06-24 | Disposition: A | Discharge status: Home / Self Care | Payer: Medicare Other | Source: Ambulatory Visit | Attending: Radiation Oncology | Admitting: Radiation Oncology

## 2014-06-24 VITALS — BP 113/70 | HR 67 | Temp 97.7°F | Resp 20 | Wt 170.5 lb

## 2014-06-24 DIAGNOSIS — C50412 Malignant neoplasm of upper-outer quadrant of left female breast: Secondary | ICD-10-CM

## 2014-06-24 DIAGNOSIS — Z510 Encounter for antineoplastic radiation therapy: Secondary | ICD-10-CM | POA: Diagnosis not present

## 2014-06-24 NOTE — Progress Notes (Signed)
Weekly rad txs 28/30  rad txs left breast/subcalv , erythema in both areas, on her boost now, skin thinning under inframmary fold, uses radiaplex bid, itching only on subclavian area, no scratching, HIH,  Appetite good, energy  Comes and goes stated 3:05 PM

## 2014-06-25 ENCOUNTER — Ambulatory Visit: Payer: Medicare Other

## 2014-06-25 ENCOUNTER — Ambulatory Visit
Admission: RE | Admit: 2014-06-25 | Discharge: 2014-06-25 | Disposition: A | Discharge status: Home / Self Care | Payer: Medicare Other | Source: Ambulatory Visit | Attending: Radiation Oncology | Admitting: Radiation Oncology

## 2014-06-25 DIAGNOSIS — Z510 Encounter for antineoplastic radiation therapy: Secondary | ICD-10-CM | POA: Diagnosis not present

## 2014-06-25 NOTE — Progress Notes (Signed)
   Weekly Management Note:  outpatient    ICD-9-CM ICD-10-CM  1. Breast cancer of upper-outer quadrant of left female breast 174.4 C50.412    Current Dose:  56 Gy  Projected Dose: 60 Gy   Narrative:  The patient presents for routine under treatment assessment.  CBCT/MVCT images/Port film x-rays were reviewed.  The chart was checked. Doing well. Some increased skin irritation.   Physical Findings:  weight is 170 lb 8 oz (77.338 kg). Her oral temperature is 97.7 F (36.5 C). Her blood pressure is 113/70 and her pulse is 67. Her respiration is 20.  erythematous left SCV and breast regions, skin intact  Impression:  The patient is tolerating radiotherapy.  Plan:  Continue radiotherapy as planned. Continue radiaplex.  1% hydrocortisone cream for areas that might itch.  Vit E lotion can be used in a few weeks. F/u in 3mo.    ________________________________   Eppie Gibson, M.D.

## 2014-06-26 ENCOUNTER — Ambulatory Visit: Payer: Medicare Other

## 2014-06-26 ENCOUNTER — Ambulatory Visit
Admission: RE | Admit: 2014-06-26 | Discharge: 2014-06-26 | Disposition: A | Discharge status: Home / Self Care | Payer: Medicare Other | Source: Ambulatory Visit | Attending: Radiation Oncology | Admitting: Radiation Oncology

## 2014-06-26 ENCOUNTER — Encounter: Payer: Self-pay | Admitting: Radiation Oncology

## 2014-06-26 DIAGNOSIS — Z510 Encounter for antineoplastic radiation therapy: Secondary | ICD-10-CM | POA: Diagnosis not present

## 2014-07-01 ENCOUNTER — Other Ambulatory Visit: Payer: Medicare Other | Admitting: Lab

## 2014-07-01 ENCOUNTER — Ambulatory Visit: Payer: Medicare Other | Admitting: Family

## 2014-07-01 ENCOUNTER — Ambulatory Visit (HOSPITAL_BASED_OUTPATIENT_CLINIC_OR_DEPARTMENT_OTHER): Payer: Medicare Other | Admitting: Family

## 2014-07-01 ENCOUNTER — Other Ambulatory Visit (HOSPITAL_BASED_OUTPATIENT_CLINIC_OR_DEPARTMENT_OTHER): Payer: Medicare Other | Admitting: Lab

## 2014-07-01 ENCOUNTER — Ambulatory Visit: Payer: Medicare Other

## 2014-07-01 ENCOUNTER — Ambulatory Visit (HOSPITAL_BASED_OUTPATIENT_CLINIC_OR_DEPARTMENT_OTHER): Payer: Medicare Other

## 2014-07-01 ENCOUNTER — Encounter: Payer: Self-pay | Admitting: Family

## 2014-07-01 VITALS — BP 146/76 | HR 56 | Temp 97.7°F | Resp 14 | Ht 67.0 in | Wt 168.0 lb

## 2014-07-01 DIAGNOSIS — C50912 Malignant neoplasm of unspecified site of left female breast: Secondary | ICD-10-CM

## 2014-07-01 DIAGNOSIS — C50919 Malignant neoplasm of unspecified site of unspecified female breast: Secondary | ICD-10-CM

## 2014-07-01 DIAGNOSIS — C50412 Malignant neoplasm of upper-outer quadrant of left female breast: Secondary | ICD-10-CM

## 2014-07-01 DIAGNOSIS — C773 Secondary and unspecified malignant neoplasm of axilla and upper limb lymph nodes: Secondary | ICD-10-CM

## 2014-07-01 DIAGNOSIS — Z5112 Encounter for antineoplastic immunotherapy: Secondary | ICD-10-CM

## 2014-07-01 LAB — CMP (CANCER CENTER ONLY)
ALT(SGPT): 13 U/L (ref 10–47)
AST: 13 U/L (ref 11–38)
Albumin: 3.8 g/dL (ref 3.3–5.5)
Alkaline Phosphatase: 39 U/L (ref 26–84)
BUN, Bld: 22 mg/dL (ref 7–22)
CO2: 25 mEq/L (ref 18–33)
Calcium: 9.4 mg/dL (ref 8.0–10.3)
Chloride: 99 mEq/L (ref 98–108)
Creat: 1.3 mg/dl — ABNORMAL HIGH (ref 0.6–1.2)
Glucose, Bld: 113 mg/dL (ref 73–118)
Potassium: 4.1 mEq/L (ref 3.3–4.7)
Sodium: 138 mEq/L (ref 128–145)
Total Bilirubin: 0.9 mg/dl (ref 0.20–1.60)
Total Protein: 6.9 g/dL (ref 6.4–8.1)

## 2014-07-01 LAB — CBC WITH DIFFERENTIAL (CANCER CENTER ONLY)
BASO#: 0 10*3/uL (ref 0.0–0.2)
BASO%: 0.4 % (ref 0.0–2.0)
EOS%: 0 % (ref 0.0–7.0)
Eosinophils Absolute: 0 10*3/uL (ref 0.0–0.5)
HCT: 34.7 % — ABNORMAL LOW (ref 34.8–46.6)
HGB: 11.8 g/dL (ref 11.6–15.9)
LYMPH#: 0.3 10*3/uL — ABNORMAL LOW (ref 0.9–3.3)
LYMPH%: 6.2 % — ABNORMAL LOW (ref 14.0–48.0)
MCH: 30.3 pg (ref 26.0–34.0)
MCHC: 34 g/dL (ref 32.0–36.0)
MCV: 89 fL (ref 81–101)
MONO#: 0.3 10*3/uL (ref 0.1–0.9)
MONO%: 5.9 % (ref 0.0–13.0)
NEUT#: 4.8 10*3/uL (ref 1.5–6.5)
NEUT%: 87.5 % — ABNORMAL HIGH (ref 39.6–80.0)
Platelets: 129 10*3/uL — ABNORMAL LOW (ref 145–400)
RBC: 3.9 10*6/uL (ref 3.70–5.32)
RDW: 14.1 % (ref 11.1–15.7)
WBC: 5.5 10*3/uL (ref 3.9–10.0)

## 2014-07-01 MED ORDER — ACETAMINOPHEN 325 MG PO TABS
650.0000 mg | ORAL_TABLET | Freq: Once | ORAL | Status: AC
  Administered 2014-07-01: 650 mg via ORAL

## 2014-07-01 MED ORDER — ACETAMINOPHEN 325 MG PO TABS
ORAL_TABLET | ORAL | Status: AC
  Filled 2014-07-01: qty 2

## 2014-07-01 MED ORDER — DIPHENHYDRAMINE HCL 25 MG PO CAPS
50.0000 mg | ORAL_CAPSULE | Freq: Once | ORAL | Status: AC
  Administered 2014-07-01: 50 mg via ORAL

## 2014-07-01 MED ORDER — TRASTUZUMAB CHEMO INJECTION 440 MG
6.0000 mg/kg | Freq: Once | INTRAVENOUS | Status: AC
  Administered 2014-07-01: 462 mg via INTRAVENOUS
  Filled 2014-07-01: qty 22

## 2014-07-01 MED ORDER — SODIUM CHLORIDE 0.9 % IV SOLN
Freq: Once | INTRAVENOUS | Status: AC
  Administered 2014-07-01: 13:00:00 via INTRAVENOUS

## 2014-07-01 MED ORDER — LETROZOLE 2.5 MG PO TABS: 2.5000 mg | ORAL_TABLET | Freq: Every day | ORAL | Status: DC

## 2014-07-01 MED ORDER — HEPARIN SOD (PORK) LOCK FLUSH 100 UNIT/ML IV SOLN
500.0000 [IU] | Freq: Once | INTRAVENOUS | Status: AC | PRN
  Administered 2014-07-01: 500 [IU]
  Filled 2014-07-01: qty 5

## 2014-07-01 MED ORDER — SODIUM CHLORIDE 0.9 % IJ SOLN
10.0000 mL | INTRAMUSCULAR | Status: DC | PRN
  Administered 2014-07-01: 10 mL
  Filled 2014-07-01: qty 10

## 2014-07-01 MED ORDER — DIPHENHYDRAMINE HCL 25 MG PO CAPS
ORAL_CAPSULE | ORAL | Status: AC
  Filled 2014-07-01: qty 2

## 2014-07-01 NOTE — Patient Instructions (Signed)

## 2014-07-01 NOTE — Progress Notes (Signed)
[place]  [phone]) [phone] [phone]) [phone]  ID: LOUISA FAVARO OB: 03/20/1935 MR#: [id number] OQH#:[id number] Patient Care Team: Laurey Morale, MD as PCP - General  DIAGNOSIS: Stage IIA (T1N1M0) adenocarcinoma of the left breast-triple positive  INTERVAL HISTORY: Ms. Kim [name] is here today for follow-up. She is doing well. She recently finished her radiation therapy to her left breast. She has done very well with treatment and has had no complications. She denies fever, chills, n/v, cough, rash, headache, dizziness, SOB, chest pain, palpitations, abdominal pain, constipation, diarrhea, blood in urine or stool. She has had no swelling or tenderness in her extremities. Her appetite is good and she is drinking plenty of fluids. In July, her echo showed an ejection fraction of 65-70%. Her neuropathy in her feet is the same and she states that it is tolerable. The swelling in her legs is better. Her weight is stable. She has lost 2 lbs since her last visit. She has a scrape on the top of her head and it is scabbed. She is going to keep an eye on this. She has a dermatologist so if it continues she will follow-up with them. She also is having some issues with incontinence of her bladder and recently found out she needs to have her bladder "lifted". She is asking today if it is ok to have this done. I spoke with Dr. Marin Olp about this and he said that would be fine. She also said the decadron causes her to break out on her cheeks.    CURRENT TREATMENT: Status post cycle 4 of Abraxane/Herceptin  Maintenance Herceptin q 3wk  Radiation therapy to the left breast completed Zometa 4 mg IV every 6 months  Femara 2.5 mg by mouth daily (started 07/01/14)  REVIEW OF SYSTEMS: All other 10 point review of systems is negative.   PAST MEDICAL HISTORY: Past Medical History  Diagnosis Date  . Allergy   . Allergic rhinitis   . Hypothyroidism   . Urinary tract infection   . Diverticulosis    . Hx of adenomatous colonic polyps 02/1999  . Tubulovillous adenoma of colon 12/2009    with HGD  . Low back pain     gets ESI from Dr. Rennis Harding  . Arthritis   . Hypertension   . HOH (hard of hearing)   . Wears hearing aid   . Breast cancer of upper-outer quadrant of left female breast 11/08/13  . CAD (coronary artery disease)     a. 12/2013 Cath/PCI: LM nl, LAD 20p, 60m, 30d, D1 30p, LCX 20p, 12m, OM1 20, Mo2 40p, RCA 30p, 36m (4.0x16 Promus Premier DES), 20d, RPL 30, RPDA 70p, 73m, EF 65-70%.  . Hyperlipidemia     PAST SURGICAL HISTORY: Past Surgical History  Procedure Laterality Date  . Abdominal hysterectomy    . Bilateral salpingoophorectomy      see Laurin Coder NP for GYN exams  . Cataract extraction w/ intraocular lens  implant, bilateral  2012    bilateral  . Colonoscopy  01-14-12    per Dr. Fuller Plan, adenomatous polyps, repeat in 3 years   . Breast lumpectomy with needle localization and axillary lymph node dissection Left 11/05/2013    Procedure: LEFT BREAST LUMPECTOMY WITH NEEDLE LOCALIZATION and axillary lymph Node Dissection;  Surgeon: Edward Jolly, MD;  Location: [facility];  Service: General;  Laterality: Left;  . Breast surgery    . Portacath placement Right 12/11/2013    Procedure: INSERTION PORT-A-CATH;  Surgeon: Edward Jolly, MD;  Location: [facility];  Service: General;  Laterality: Right;  Subclavian Vein;    FAMILY HISTORY Family History  Problem Relation Age of Onset  . Alcohol abuse Father   . Kidney failure Mother   . Colon cancer      family   GYNECOLOGIC HISTORY:  Patient's last menstrual period was 10/19/1991.   SOCIAL HISTORY:  History   Social History  . Marital Status: Married    Spouse Name: N/A    Number of Children: 2  . Years of Education: N/A   Occupational History  . Retired-school system    Social History Main Topics  . Smoking status: Never Smoker   . Smokeless tobacco: Never Used      Comment: never used tobacco  . Alcohol Use: No  . Drug Use: No  . Sexual Activity: Not Currently   Other Topics Concern  . Not on file   Social History Narrative  . No narrative on file   ADVANCED DIRECTIVES: <no information>  HEALTH MAINTENANCE: History  Substance Use Topics  . Smoking status: Never Smoker   . Smokeless tobacco: Never Used     Comment: never used tobacco  . Alcohol Use: No   Colonoscopy: PAP: Bone density: Lipid panel:  Allergies  Allergen Reactions  . Propoxyphene N-Acetaminophen Swelling    tongue swelling  . Penicillins Swelling    Facial swelling  . Cortisone Rash  . Pseudoephedrine Hcl Er Rash and Other (See Comments)    Face gets red   Current Outpatient Prescriptions  Medication Sig Dispense Refill  . acetaminophen (TYLENOL) 650 MG CR tablet Take 650 mg by mouth every 8 (eight) hours as needed.      Marland Kitchen acidophilus (RISAQUAD) CAPS capsule Take 1 capsule by mouth daily.      . Ascorbic Acid (VITAMIN C) 1000 MG tablet Take 1,000 mg by mouth daily.      Marland Kitchen aspirin EC 81 MG EC tablet Take 1 tablet (81 mg total) by mouth daily.      Marland Kitchen atorvastatin (LIPITOR) 10 MG tablet Take 1 tablet (10 mg total) by mouth daily at 6 PM.  30 tablet  6  . Calcium Citrate-Vitamin D (CITRACAL MAXIMUM PO) Take 1 tablet by mouth daily.       . cetirizine (ZYRTEC) 10 MG tablet Take 10 mg by mouth daily as needed for allergies.      Marland Kitchen clopidogrel (PLAVIX) 75 MG tablet Take 1 tablet (75 mg total) by mouth daily with breakfast.  30 tablet  6  . Cyanocobalamin (VITAMIN B-12) 2500 MCG SUBL Place under the tongue as needed.      . furosemide (LASIX) 20 MG tablet Take 1 tablet (20 mg total) by mouth daily. Take 1 tablet (20 mg total) every day x 3 days then take once daily as needed  30 tablet  0  . levothyroxine (SYNTHROID, LEVOTHROID) 125 MCG tablet Take 1 tablet (125 mcg total) by mouth daily before breakfast.  90 tablet  3  . lidocaine-prilocaine (EMLA) cream Apply topically  as needed.  30 g  6  . Loperamide HCl (IMODIUM PO) Take by mouth as needed.      . metoprolol succinate (TOPROL-XL) 50 MG 24 hr tablet Take 1 & 1/2 tabs daily  45 tablet  6  . Multiple Vitamins-Minerals (CENTRUM ADULTS PO) Take by mouth.      . nitroGLYCERIN (NITROSTAT) 0.4 MG SL tablet Place 1 tablet (0.4 mg  total) under the tongue every 5 (five) minutes x 3 doses as needed for chest pain.  25 tablet  3  . letrozole (FEMARA) 2.5 MG tablet Take 1 tablet (2.5 mg total) by mouth daily.  30 tablet  4  . [DISCONTINUED] ferrous sulfate 325 (65 FE) MG tablet Take 325 mg by mouth 2 (two) times daily.         No current facility-administered medications for this visit.   Facility-Administered Medications Ordered in Other Visits  Medication Dose Route Frequency Provider Last Rate Last Dose  . sodium chloride 0.9 % injection 10 mL  10 mL Intracatheter PRN [name], MD   10 mL at 07/01/14 1401   OBJECTIVE: Filed Vitals:   07/01/14 1202  BP: 146/76  Pulse: 56  Temp: 97.7 F (36.5 C)  Resp: 14   Body mass index is 26.31 kg/(m^2). ECOG FS:0 - Asymptomatic Ocular: Sclerae unicteric, pupils equal, round and reactive to light Ear-nose-throat: Oropharynx clear, dentition fair Lymphatic: No cervical or supraclavicular adenopathy Lungs no rales or rhonchi, good excursion bilaterally Heart regular rate and rhythm, no murmur appreciated Abd soft, nontender, positive bowel sounds MSK no focal spinal tenderness, no joint edema Neuro: non-focal, well-oriented, appropriate affect Breasts: No changes, no lumps, bumps, rashes, no lymphedema.   LAB RESULTS: CMP     Component Value Date/Time   NA 138 07/01/2014 1106   NA 135 04/18/2014 1319   NA 137 01/28/2014 1354   K 4.1 07/01/2014 1106   K 4.0 04/18/2014 1319   K 4.3 01/28/2014 1354   CL 99 07/01/2014 1106   CL 105 04/18/2014 1319   CO2 25 07/01/2014 1106   CO2 20 04/18/2014 1319   CO2 21* 01/28/2014 1354   GLUCOSE 113 07/01/2014 1106   GLUCOSE 166*  04/18/2014 1319   GLUCOSE 156* 01/28/2014 1354   BUN 22 07/01/2014 1106   BUN 35* 04/18/2014 1319   BUN 35.3* 01/28/2014 1354   CREATININE 1.3* 07/01/2014 1106   CREATININE 1.04 04/18/2014 1319   CREATININE 1.3* 01/28/2014 1354   CALCIUM 9.4 07/01/2014 1106   CALCIUM 9.2 04/18/2014 1319   CALCIUM 9.4 01/28/2014 1354   PROT 6.9 07/01/2014 1106   PROT 6.0 04/18/2014 1319   PROT 6.7 01/28/2014 1354   ALBUMIN 3.7 04/18/2014 1319   ALBUMIN 3.7 01/28/2014 1354   AST 13 07/01/2014 1106   AST 16 04/18/2014 1319   AST 10 01/28/2014 1354   ALT 13 07/01/2014 1106   ALT 19 04/18/2014 1319   ALT 13 01/28/2014 1354   ALKPHOS 39 07/01/2014 1106   ALKPHOS 59 04/18/2014 1319   ALKPHOS 63 01/28/2014 1354   BILITOT 0.90 07/01/2014 1106   BILITOT 0.8 04/18/2014 1319   BILITOT 0.76 01/28/2014 1354   GFRNONAA 65* 12/20/2013 0154   GFRAA 76* 12/20/2013 0154   No results found for this basename: SPEP, UPEP,  kappa and lambda light chains   Lab Results  Component Value Date   WBC 5.5 07/01/2014   NEUTROABS 4.8 07/01/2014   HGB 11.8 07/01/2014   HCT 34.7* 07/01/2014   MCV 89 07/01/2014   PLT 129* 07/01/2014   No results found for this basename: LABCA2   No components found with this basename: [id number]   No results found for this basename: INR,  in the last 168 hours  STUDIES: No results found.  ASSESSMENT/PLAN: Ms. Sean is 79 year old white female with stage IIA ductal carcinoma of the left breast. She had 4 positive lymph nodes.  Her breast cancer was triple positive. She completed her adjuvant chemotherapy. She completed this in July of 2015. She recently completed radiation to the left breast. She is doing well with Herceptin. No complications. She is asymptomatic today.  She will continue to get Zometa every 6 months.  We will start her on Femara 2.5 mg daily since radiation is complete.  She can stop the Decadron.  She has her treatment schedule which include a follow-up appointment with Dr. Marin Olp in November.   She knows to call here with any questions or concerns and to go to the ED in the even of an emergency. We can certainly see her sooner if need be.   Eliezer Bottom, NP 07/01/2014 2:25 PM

## 2014-07-06 NOTE — Progress Notes (Signed)
  Radiation Oncology         [phone] ________________________________  Name: Kim [name] MRN: [id number]  Date: 06/26/2014  DOB: 07-03-1935  End of Treatment Note  Diagnosis:  TisN2Mx Stage III left breast ductal carcinoma with 5 of 11 positive lymph nodes    Indication for treatment:  curative       Radiation treatment dates:   05/14/2014-06/26/2014  Site/dose:       1) Left Breast  / 50 Gy in 25 fractions 2) Left Supraclavicular fossa/ 47.5 Gy in 25 fractions 3) Left Posterior Axillary boost / 4.825 Gy in 25 fractions 4) Left Breast boost / 10 Gy in 5 fractions  Beams/energy:   1) Opposed tangents / 10 and 6 MV photons 2) Right anterior oblique / 10 MV photons 3) PA / 6MV photons 4) En face electrons / 12 MeV electrons   Narrative: The patient tolerated radiation treatment relatively well with skin erythema.  Plan: The patient has completed radiation treatment. The patient will return to radiation oncology clinic for routine followup in one month. I advised them to call or return sooner if they have any questions or concerns related to their recovery or treatment.  -----------------------------------  Eppie Gibson, MD

## 2014-07-08 ENCOUNTER — Other Ambulatory Visit: Payer: Self-pay | Admitting: *Deleted

## 2014-07-11 ENCOUNTER — Ambulatory Visit (INDEPENDENT_AMBULATORY_CARE_PROVIDER_SITE_OTHER): Payer: Medicare Other | Admitting: Cardiology

## 2014-07-11 ENCOUNTER — Encounter: Payer: Self-pay | Admitting: Cardiology

## 2014-07-11 ENCOUNTER — Encounter: Payer: Self-pay | Admitting: *Deleted

## 2014-07-11 VITALS — BP 115/68 | HR 66 | Ht 67.0 in | Wt 168.2 lb

## 2014-07-11 DIAGNOSIS — I421 Obstructive hypertrophic cardiomyopathy: Secondary | ICD-10-CM

## 2014-07-11 DIAGNOSIS — E785 Hyperlipidemia, unspecified: Secondary | ICD-10-CM

## 2014-07-11 DIAGNOSIS — I2510 Atherosclerotic heart disease of native coronary artery without angina pectoris: Secondary | ICD-10-CM

## 2014-07-11 MED ORDER — METOPROLOL SUCCINATE ER 50 MG PO TB24: 50.0000 mg | ORAL_TABLET | Freq: Two times a day (BID) | ORAL | Status: DC

## 2014-07-11 NOTE — Patient Instructions (Signed)
Take Toprol XL(metoprolol succinate) 50mg  two times a day  Your physician has requested that you have an echocardiogram. Echocardiography is a painless test that uses sound waves to create images of your heart. It provides your doctor with information about the size and shape of your heart and how well your heart's chambers and valves are working. This procedure takes approximately one hour. There are no restrictions for this procedure.  Your physician recommends that you return for a FASTING lipid profile/BMET.  Your physician wants you to follow-up in: about 3 months with Dr Aundra Dubin. (January 2016) You will receive a reminder letter in the mail two months in advance. If you don't receive a letter, please call our office to schedule the follow-up appointment.   Your physician has requested that you have an echocardiogram. Echocardiography is a painless test that uses sound waves to create images of your heart. It provides your doctor with information about the size and shape of your heart and how well your heart's chambers and valves are working. This procedure takes approximately one hour. There are no restrictions for this procedure.  3 months after echocardiogram in October--see Dr Aundra Dubin a few days after you have this done.

## 2014-07-11 NOTE — Progress Notes (Signed)
Patient ID: Kim [name], female   DOB: April 04, 1935, 79 y.o.   MRN: [id number] PCP: Dr. Sarajane Jews Oncologist: Dr. Marin Olp  79 yo with history of CAD and HOCM presents for cardiology evaluation given use of Herceptin with her breast cancer chemo.  Patient is s/p lumpectomy with lymph node biopsy in 2/15.  4/10 nodes positive.  She was started on docetaxol/carboplatin/Herceptin in 3/15 with plan for 6 cycles chemo. She had a tough time with her 1st chemotherapy session and felt very weak afterwards.  Shortly after, she developed unstable angina and ended up getting a DES to the mid RCA in 3/15.  Patient additionally has a history of HOCM.  This has been recognized on prior echoes.  She has severe asymmetric basal septal hypertrophy and SAM with LVOT gradient peak 58 mmHg on echo in 3/15 along with moderate MR.  Repeat echo in 7/15 showed LVOT gradient down to 30 mmHg on higher beta blocker.   She returns for followup today.  She has completed adjuvant chemotherapy and radiation and will be getting Herceptin until 5/15.   Overall, she is doing well.  No chest pain.  No exertional dyspnea.  Some fatigue walking up a flight of steps.  She does yardwork without problems.  No orthopnea/PND.   ECG (3/15): NSR, lateral T wave inversions  Labs (3/15): K 4.5, creatinine 0.84, LDL 91, HDL 46 Labs (5/15): K 3.9, creatinine 1.1  PMH: 1. CAD: Unstable angina 3/15 with LHC showing 99% mRCA stenosis, treated with DES to mRCA.  2. Hypothyroidism 3. Diverticulosis 4. HTN 5. H/o TAH/BSO 6. Breast cancer: s/p lumpectomy with lymph node biopsy in 2/15.  4/10 nodes positive.  She was started on docetaxol/carboplatin/Herceptin in 3/15 with plan for 6 cycles chemo.  7. Hypertrophic obstructive cardiomyopathy: Echo (3/15) with severe focal basal septal hypertrophy (22 mm), narrow LV outflow tract with mitral valve SAM and 58 mmHg peak LVOT resting gradient, EF 60-65%, moderate MR, moderate LAE, normal RV, lateral s' 10.4  cm/sec.  Patient says that her 2 grown sons has had echoes to screen for HOCM.  Echo (4/15) with EF 65-70%, severe focal basal septal hypertrophy, LVOT gradient 33 mmHg, SAM was present with moderate MR, normal RV size and systolic function, lateral S' 10.6, GLS -17.5%.  Cardiac MRI (5/15) with EF 65%, moderate asymmetric septal hypertrophy, systolic anterior motion of the mitral valve with moderate MR, there was no delayed enhancement.  Echo (7/15) with EF 65-70%, moderate ASH, peak LVOT gradient 30 mmHg, SAM with moderate MR, moderate to severe LAE.   SH: Married, 2 children, retired, nonsmoker.   FH: No family history of HOCM or sudden death.  There is a family history of CAD.   ROS: All systems reviewed and negative except as per HPI.    Current Outpatient Prescriptions  Medication Sig Dispense Refill  . acetaminophen (TYLENOL) 650 MG CR tablet Take 650 mg by mouth every 8 (eight) hours as needed.      Marland Kitchen acidophilus (RISAQUAD) CAPS capsule Take 1 capsule by mouth daily.      . Ascorbic Acid (VITAMIN C) 1000 MG tablet Take 1,000 mg by mouth daily.      Marland Kitchen aspirin EC 81 MG EC tablet Take 1 tablet (81 mg total) by mouth daily.      Marland Kitchen atorvastatin (LIPITOR) 10 MG tablet Take 1 tablet (10 mg total) by mouth daily at 6 PM.  30 tablet  6  . Calcium Citrate-Vitamin D (CITRACAL MAXIMUM PO) Take  1 tablet by mouth daily.       . cetirizine (ZYRTEC) 10 MG tablet Take 10 mg by mouth daily as needed for allergies.      Marland Kitchen clopidogrel (PLAVIX) 75 MG tablet Take 1 tablet (75 mg total) by mouth daily with breakfast.  30 tablet  6  . Cyanocobalamin (VITAMIN B-12) 2500 MCG SUBL Place under the tongue as needed.      . furosemide (LASIX) 20 MG tablet Take 1 tablet (20 mg total) by mouth daily. Take 1 tablet (20 mg total) every day x 3 days then take once daily as needed  30 tablet  0  . levothyroxine (SYNTHROID, LEVOTHROID) 125 MCG tablet Take 1 tablet (125 mcg total) by mouth daily before breakfast.  90 tablet  3   . lidocaine-prilocaine (EMLA) cream Apply topically as needed.  30 g  6  . Loperamide HCl (IMODIUM PO) Take by mouth as needed.      . Multiple Vitamins-Minerals (CENTRUM ADULTS PO) Take by mouth.      Marland Kitchen MYRBETRIQ 25 MG TB24 tablet Take 25 mg by mouth daily.       . nitrofurantoin, macrocrystal-monohydrate, (MACROBID) 100 MG capsule       . nitroGLYCERIN (NITROSTAT) 0.4 MG SL tablet Place 1 tablet (0.4 mg total) under the tongue every 5 (five) minutes x 3 doses as needed for chest pain.  25 tablet  3  . dexamethasone (DECADRON) 4 MG tablet Take 1 tablet (4 mg total) by mouth daily.      . metoprolol succinate (TOPROL XL) 50 MG 24 hr tablet Take 1 tablet (50 mg total) by mouth 2 (two) times daily. Take with or immediately following a meal.  60 tablet  6  . [DISCONTINUED] ferrous sulfate 325 (65 FE) MG tablet Take 325 mg by mouth 2 (two) times daily.         No current facility-administered medications for this visit.   BP 115/68  Pulse 66  Ht 5\' 7"  (1.702 m)  Wt 168 lb 3.2 oz (76.295 kg)  BMI 26.34 kg/m2  LMP 10/19/1991 General: NAD Neck: No JVD, no thyromegaly or thyroid nodule.  Lungs: Clear to auscultation bilaterally with normal respiratory effort. CV: Nondisplaced PMI.  Heart regular S1/S2, no S3/S4, 2/6 SEM RUSB.  No peripheral edema.  No carotid bruit.  Normal pedal pulses.  Abdomen: Soft, nontender, no hepatosplenomegaly, no distention.  Skin: Intact without lesions or rashes.  Neurologic: Alert and oriented x 3.  Psych: Normal affect. Extremities: No clubbing or cyanosis.   Assessment/Plan: 1. CAD: Status post PCI for unstable angina in 3/15 with DES to [facility].  - Continue ASA 81, statin, Plavix, and Toprol XL.   2. Hyperlipidemia: Myalgias with atoravastatin 40 mg daily but she can tolerate 10 mg daily.  I will have her continue this.  Lipids today.  3. Hypertrophic obstructive cardiomyopathy: No family history of HOCM or sudden death (interestingly, her husband has HOCM).   Most recent echo showed peak LVOT gradient 30 mmHg with mitral valve SAM and moderate MR.  She does not have significant exertional symptoms. She has never passed out.  LVOT gradient was lower on most recent echo after increasing Toprol XL.  Cardiac MRI in 5/15 showed no delayed enhancement.  - I will increase Toprol XL to 50 mg bid to help lower LVOT gradient.    - Per patient, her 2 sons (in their 71s) have had screening echoes to look for hypertrophic cardiomyopathy and did not  show signs of HOCM.   4. Breast cancer/Herceptin: Continue screening for cardiomyopathy related to Herceptin.  Echo and office visit in 3 months.    [name] 07/11/2014

## 2014-07-15 NOTE — Telephone Encounter (Signed)
Encounter open in error 

## 2014-07-17 NOTE — Addendum Note (Signed)
Addended by: Katrine Coho on: 07/17/2014 08:40 AM   Modules accepted: Orders

## 2014-07-24 ENCOUNTER — Other Ambulatory Visit (HOSPITAL_COMMUNITY): Payer: Medicare Other

## 2014-07-24 ENCOUNTER — Other Ambulatory Visit (INDEPENDENT_AMBULATORY_CARE_PROVIDER_SITE_OTHER): Payer: Medicare Other | Admitting: *Deleted

## 2014-07-24 DIAGNOSIS — I421 Obstructive hypertrophic cardiomyopathy: Secondary | ICD-10-CM

## 2014-07-24 DIAGNOSIS — E785 Hyperlipidemia, unspecified: Secondary | ICD-10-CM

## 2014-07-24 LAB — LIPID PANEL
Cholesterol: 228 mg/dL — ABNORMAL HIGH (ref 0–200)
HDL: 38.6 mg/dL — ABNORMAL LOW (ref >39.00)
NonHDL: 189.4
Total CHOL/HDL Ratio: 6
Triglycerides: 242 mg/dL — ABNORMAL HIGH (ref 0.0–149.0)
VLDL: 48.4 mg/dL — ABNORMAL HIGH (ref 0.0–40.0)

## 2014-07-24 LAB — BASIC METABOLIC PANEL
BUN: 17 mg/dL (ref 6–23)
CO2: 23 mEq/L (ref 19–32)
Calcium: 9.2 mg/dL (ref 8.4–10.5)
Chloride: 105 mEq/L (ref 96–112)
Creatinine, Ser: 1 mg/dL (ref 0.4–1.2)
GFR: 56.39 mL/min — ABNORMAL LOW (ref >60.00)
Glucose, Bld: 85 mg/dL (ref 70–99)
Potassium: 4.2 mEq/L (ref 3.5–5.1)
Sodium: 140 mEq/L (ref 135–145)

## 2014-07-24 NOTE — Progress Notes (Signed)
[employer] representative Note  Diagnosis: Left Breast Cancer   The patient's CT images from her initial simulation were reviewed to plan her boost treatment to her left breast  lumpectomy cavity.  Measurements were made regarding the size and depth of the surgical bed. The boost to the lumpectomy cavity will be delivered with 12 MeV electrons prescribed to the 100% isodose line.   A special port plan was reviewed and approved. A custom electron cut-out will be used for her boost field. 10 Gy in 5 fractions prescribed.  -----------------------------------  Eppie Gibson, MD

## 2014-07-25 LAB — LDL CHOLESTEROL, DIRECT: Direct LDL: 121.2 mg/dL

## 2014-07-26 ENCOUNTER — Ambulatory Visit: Payer: Medicare Other | Admitting: Radiation Oncology

## 2014-07-29 ENCOUNTER — Other Ambulatory Visit: Payer: Self-pay | Admitting: *Deleted

## 2014-07-29 DIAGNOSIS — E785 Hyperlipidemia, unspecified: Secondary | ICD-10-CM

## 2014-07-29 MED ORDER — ROSUVASTATIN CALCIUM 10 MG PO TABS: 10.0000 mg | ORAL_TABLET | Freq: Every day | ORAL | Status: DC

## 2014-07-31 ENCOUNTER — Ambulatory Visit (HOSPITAL_BASED_OUTPATIENT_CLINIC_OR_DEPARTMENT_OTHER): Payer: Medicare Other | Admitting: Hematology & Oncology

## 2014-07-31 ENCOUNTER — Other Ambulatory Visit (HOSPITAL_BASED_OUTPATIENT_CLINIC_OR_DEPARTMENT_OTHER): Payer: Medicare Other | Admitting: Lab

## 2014-07-31 ENCOUNTER — Ambulatory Visit (HOSPITAL_BASED_OUTPATIENT_CLINIC_OR_DEPARTMENT_OTHER): Payer: Medicare Other

## 2014-07-31 ENCOUNTER — Encounter: Payer: Self-pay | Admitting: Hematology & Oncology

## 2014-07-31 VITALS — BP 117/68 | HR 67 | Temp 98.0°F | Resp 14 | Ht 67.0 in | Wt 171.0 lb

## 2014-07-31 DIAGNOSIS — C50412 Malignant neoplasm of upper-outer quadrant of left female breast: Secondary | ICD-10-CM

## 2014-07-31 DIAGNOSIS — Z5112 Encounter for antineoplastic immunotherapy: Secondary | ICD-10-CM

## 2014-07-31 DIAGNOSIS — I2510 Atherosclerotic heart disease of native coronary artery without angina pectoris: Secondary | ICD-10-CM

## 2014-07-31 DIAGNOSIS — C50912 Malignant neoplasm of unspecified site of left female breast: Secondary | ICD-10-CM

## 2014-07-31 DIAGNOSIS — Z170 Estrogen receptor positive status [ER+]: Secondary | ICD-10-CM

## 2014-07-31 DIAGNOSIS — Z23 Encounter for immunization: Secondary | ICD-10-CM

## 2014-07-31 DIAGNOSIS — C50919 Malignant neoplasm of unspecified site of unspecified female breast: Secondary | ICD-10-CM

## 2014-07-31 LAB — CMP (CANCER CENTER ONLY)
ALT(SGPT): 14 U/L (ref 10–47)
AST: 15 U/L (ref 11–38)
Albumin: 3.5 g/dL (ref 3.3–5.5)
Alkaline Phosphatase: 46 U/L (ref 26–84)
BUN, Bld: 14 mg/dL (ref 7–22)
CO2: 27 mEq/L (ref 18–33)
Calcium: 9.2 mg/dL (ref 8.0–10.3)
Chloride: 103 mEq/L (ref 98–108)
Creat: 1.1 mg/dl (ref 0.6–1.2)
Glucose, Bld: 85 mg/dL (ref 73–118)
Potassium: 3.4 mEq/L (ref 3.3–4.7)
Sodium: 143 mEq/L (ref 128–145)
Total Bilirubin: 0.8 mg/dl (ref 0.20–1.60)
Total Protein: 6.5 g/dL (ref 6.4–8.1)

## 2014-07-31 LAB — CBC WITH DIFFERENTIAL (CANCER CENTER ONLY)
BASO#: 0 10*3/uL (ref 0.0–0.2)
BASO%: 0.5 % (ref 0.0–2.0)
EOS%: 2.6 % (ref 0.0–7.0)
Eosinophils Absolute: 0.1 10*3/uL (ref 0.0–0.5)
HCT: 36.2 % (ref 34.8–46.6)
HGB: 12.1 g/dL (ref 11.6–15.9)
LYMPH#: 0.6 10*3/uL — ABNORMAL LOW (ref 0.9–3.3)
LYMPH%: 15.3 % (ref 14.0–48.0)
MCH: 29.8 pg (ref 26.0–34.0)
MCHC: 33.4 g/dL (ref 32.0–36.0)
MCV: 89 fL (ref 81–101)
MONO#: 0.4 10*3/uL (ref 0.1–0.9)
MONO%: 9.6 % (ref 0.0–13.0)
NEUT#: 2.8 10*3/uL (ref 1.5–6.5)
NEUT%: 72 % (ref 39.6–80.0)
Platelets: 130 10*3/uL — ABNORMAL LOW (ref 145–400)
RBC: 4.06 10*6/uL (ref 3.70–5.32)
RDW: 13.6 % (ref 11.1–15.7)
WBC: 3.9 10*3/uL (ref 3.9–10.0)

## 2014-07-31 MED ORDER — ACETAMINOPHEN 325 MG PO TABS
ORAL_TABLET | ORAL | Status: AC
  Filled 2014-07-31: qty 2

## 2014-07-31 MED ORDER — SODIUM CHLORIDE 0.9 % IJ SOLN
10.0000 mL | INTRAMUSCULAR | Status: DC | PRN
  Administered 2014-07-31: 10 mL
  Filled 2014-07-31: qty 10

## 2014-07-31 MED ORDER — INFLUENZA VAC SPLIT QUAD 0.5 ML IM SUSY
0.5000 mL | PREFILLED_SYRINGE | Freq: Once | INTRAMUSCULAR | Status: AC
  Administered 2014-07-31: 0.5 mL via INTRAMUSCULAR
  Filled 2014-07-31: qty 0.5

## 2014-07-31 MED ORDER — SODIUM CHLORIDE 0.9 % IV SOLN
Freq: Once | INTRAVENOUS | Status: AC
  Administered 2014-07-31: 11:00:00 via INTRAVENOUS

## 2014-07-31 MED ORDER — ACETAMINOPHEN 325 MG PO TABS
650.0000 mg | ORAL_TABLET | Freq: Once | ORAL | Status: AC
  Administered 2014-07-31: 650 mg via ORAL

## 2014-07-31 MED ORDER — TRASTUZUMAB CHEMO INJECTION 440 MG
6.0000 mg/kg | Freq: Once | INTRAVENOUS | Status: AC
  Administered 2014-07-31: 462 mg via INTRAVENOUS
  Filled 2014-07-31: qty 22

## 2014-07-31 MED ORDER — DIPHENHYDRAMINE HCL 25 MG PO CAPS
ORAL_CAPSULE | ORAL | Status: AC
  Filled 2014-07-31: qty 2

## 2014-07-31 MED ORDER — DIPHENHYDRAMINE HCL 25 MG PO CAPS
50.0000 mg | ORAL_CAPSULE | Freq: Once | ORAL | Status: AC
  Administered 2014-07-31: 50 mg via ORAL

## 2014-07-31 MED ORDER — HEPARIN SOD (PORK) LOCK FLUSH 100 UNIT/ML IV SOLN
500.0000 [IU] | Freq: Once | INTRAVENOUS | Status: AC | PRN
  Administered 2014-07-31: 500 [IU]
  Filled 2014-07-31: qty 5

## 2014-07-31 NOTE — Progress Notes (Signed)
Hematology and Oncology Follow Up Visit  Kim [name] [id number] Nov 30, 1934 79 y.o. 07/31/2014   Principle Diagnosis:  Stage IIA (T1N1M0) adenocarcinoma of the left breast-triple positive  Current Therapy:   Status post cycle 4 of Abraxane/Herceptin Maintenance Herceptin q 3wk Zometa 4 mg IV every 6 months Femara 2.5 mg by mouth daily      Interim History:  [name] is back for followup. She's Done with radiation therapy. She completed this back in September. She had 50 rd to the left breast. She had 47.5 gray to the left subclavicular fossa. She had a 10 rd boost to the left breast.  She is on Femara now. She is doing well with this.. She's not complaining much of the way of arthritis. There is no arthralgias.  She's doing well with the Herceptin. She's had no complications from this. She did have a followup echocardiogram. This was done in July. This showed an ejection fraction of 65-70%. We will get another echocardiogram on her next month.   Her appetite has been good. She's worried about gaining some weight. She has a performance status of ECOG 1.    Medications: Current outpatient prescriptions:acetaminophen (TYLENOL) 650 MG CR tablet, Take 650 mg by mouth every 8 (eight) hours as needed., Disp: , Rfl: ;  acidophilus (RISAQUAD) CAPS capsule, Take 1 capsule by mouth daily., Disp: , Rfl: ;  Ascorbic Acid (VITAMIN C) 1000 MG tablet, Take 1,000 mg by mouth daily., Disp: , Rfl: ;  aspirin EC 81 MG EC tablet, Take 1 tablet (81 mg total) by mouth daily., Disp: , Rfl:  Calcium Citrate-Vitamin D (CITRACAL MAXIMUM PO), Take 1 tablet by mouth daily. , Disp: , Rfl: ;  cetirizine (ZYRTEC) 10 MG tablet, Take 10 mg by mouth daily as needed for allergies., Disp: , Rfl: ;  clopidogrel (PLAVIX) 75 MG tablet, Take 1 tablet (75 mg total) by mouth daily with breakfast., Disp: 30 tablet, Rfl: 6;  Cyanocobalamin (VITAMIN B-12) 2500 MCG SUBL, Place under the tongue as needed., Disp: , Rfl:   dexamethasone (DECADRON) 4 MG tablet, Take 1 tablet (4 mg total) by mouth daily., Disp: , Rfl: ;  furosemide (LASIX) 20 MG tablet, Take 1 tablet (20 mg total) by mouth daily. Take 1 tablet (20 mg total) every day x 3 days then take once daily as needed, Disp: 30 tablet, Rfl: 0;  letrozole (FEMARA) 2.5 MG tablet, Take 2.5 mg by mouth daily. , Disp: , Rfl:  levothyroxine (SYNTHROID, LEVOTHROID) 125 MCG tablet, Take 1 tablet (125 mcg total) by mouth daily before breakfast., Disp: 90 tablet, Rfl: 3;  lidocaine-prilocaine (EMLA) cream, Apply topically as needed., Disp: 30 g, Rfl: 6;  Loperamide HCl (IMODIUM PO), Take by mouth as needed., Disp: , Rfl:  metoprolol succinate (TOPROL XL) 50 MG 24 hr tablet, Take 1 tablet (50 mg total) by mouth 2 (two) times daily. Take with or immediately following a meal., Disp: 60 tablet, Rfl: 6;  Multiple Vitamins-Minerals (CENTRUM ADULTS PO), Take by mouth., Disp: , Rfl: ;  nitrofurantoin, macrocrystal-monohydrate, (MACROBID) 100 MG capsule, Take 100 mg by mouth 2 (two) times daily. , Disp: , Rfl:  nitroGLYCERIN (NITROSTAT) 0.4 MG SL tablet, Place 1 tablet (0.4 mg total) under the tongue every 5 (five) minutes x 3 doses as needed for chest pain., Disp: 25 tablet, Rfl: 3;  rosuvastatin (CRESTOR) 10 MG tablet, Take 1 tablet (10 mg total) by mouth daily., Disp: 30 tablet, Rfl: 3;  [DISCONTINUED] ferrous sulfate 325 (65 FE)  MG tablet, Take 325 mg by mouth 2 (two) times daily.  , Disp: , Rfl:   Allergies:  Allergies  Allergen Reactions  . Propoxyphene N-Acetaminophen Swelling    tongue swelling  . Penicillins Swelling    Facial swelling  . Cortisone Rash  . Myrbetriq [Mirabegron]     Rash on face  . Pseudoephedrine Hcl Er Rash and Other (See Comments)    Face gets red    Past Medical History, Surgical history, Social history, and Family History were reviewed and updated.  Review of Systems: As above  Physical Exam:  height is 5\' 7"  (1.702 m) and weight is 171 lb  (77.565 kg). Her oral temperature is 98 F (36.7 C). Her blood pressure is 117/68 and her pulse is 67. Her respiration is 14.   Well-developed and well-nourished white female. Her head and neck exam shows no ocular or oral lesions. She has no palpable cervical or supraclavicular lymph nodes. Lungs are clear bilaterally. Cardiac exam regular rate and rhythm with a 2/6 systolic ejection murmur. Breast exam shows right breast with no masses edema or erythema. There is no right axillary adenopathy. Left breast shows well-healed lumpectomy at the 2:00 position. There might be a little bit of erythema. The might be a little bit of swelling. There is no tenderness at the lumpectomy site. There is no masses in the left breast. There is no left axillary adenopathy.. Abdomen is soft. She's good bowel sounds. There is no palpable liver or spleen tip. Back exam shows no tenderness over the spine ribs or hips. Extremities shows no clubbing cyanosis or edema. Neurological exam shows no focal neurological deficits.  Lab Results  Component Value Date   WBC 3.9 07/31/2014   HGB 12.1 07/31/2014   HCT 36.2 07/31/2014   MCV 89 07/31/2014   PLT 130* 07/31/2014     Chemistry      Component Value Date/Time   NA 143 07/31/2014 0901   NA 140 07/24/2014 0900   NA 137 01/28/2014 1354   K 3.4 07/31/2014 0901   K 4.2 07/24/2014 0900   K 4.3 01/28/2014 1354   CL 103 07/31/2014 0901   CL 105 07/24/2014 0900   CO2 27 07/31/2014 0901   CO2 23 07/24/2014 0900   CO2 21* 01/28/2014 1354   BUN 14 07/31/2014 0901   BUN 17 07/24/2014 0900   BUN 35.3* 01/28/2014 1354   CREATININE 1.1 07/31/2014 0901   CREATININE 1.0 07/24/2014 0900   CREATININE 1.3* 01/28/2014 1354      Component Value Date/Time   CALCIUM 9.2 07/31/2014 0901   CALCIUM 9.2 07/24/2014 0900   CALCIUM 9.4 01/28/2014 1354   ALKPHOS 46 07/31/2014 0901   ALKPHOS 59 04/18/2014 1319   ALKPHOS 63 01/28/2014 1354   AST 15 07/31/2014 0901   AST 16 04/18/2014 1319    AST 10 01/28/2014 1354   ALT 14 07/31/2014 0901   ALT 19 04/18/2014 1319   ALT 13 01/28/2014 1354   BILITOT 0.80 07/31/2014 0901   BILITOT 0.8 04/18/2014 1319   BILITOT 0.76 01/28/2014 1354         Impression and Plan: Kim [name] is 79 year old white female with stage IIA ductal carcinoma of the left breast. She had 4 positive lymph nodes. Her breast cancer was triple positive. She completed her adjuvant chemotherapy. She completed this in July of 2015.  We will go ahead with the Herceptin. I told her that Herceptin will be for 1 year. I think  she will finish this up in July 2016.  Again, we will get the echocardiogram on her. Because of her past cardiac history, I think we need to do an echocardiogram every 3-4 months.  She does have underlying coronary artery disease. She wants [place] to check her cholesterol level. I told her that this is some that we do not do and that her cardiologist will deal with this. She does not want take cholesterol medicine. She thinks that this will cause her more problems. I told her that she can talk to the cardiologist about the new subcutaneous cholesterol medicine that was approved by the FDA.    I want to see her back in 3 weeks.   [name], MD 10/28/20154:47 PM

## 2014-07-31 NOTE — Patient Instructions (Signed)
Influenza Virus Vaccine injection (Fluarix) What is this medicine? INFLUENZA VIRUS VACCINE (in floo EN zuh VAHY ruhs vak SEEN) helps to reduce the risk of getting influenza also known as the flu. This medicine may be used for other purposes; ask your health care provider or pharmacist if you have questions. COMMON BRAND NAME(S): Fluarix, Fluzone What should I tell my health care provider before I take this medicine? They need to know if you have any of these conditions: -bleeding disorder like hemophilia -fever or infection -Guillain-Barre syndrome or other neurological problems -immune system problems -infection with the human immunodeficiency virus (HIV) or AIDS -low blood platelet counts -multiple sclerosis -an unusual or allergic reaction to influenza virus vaccine, eggs, chicken proteins, latex, gentamicin, other medicines, foods, dyes or preservatives -pregnant or trying to get pregnant -breast-feeding How should I use this medicine? This vaccine is for injection into a muscle. It is given by a health care professional. A copy of Vaccine Information Statements will be given before each vaccination. Read this sheet carefully each time. The sheet may change frequently. Talk to your pediatrician regarding the use of this medicine in children. Special care may be needed. Overdosage: If you think you have taken too much of this medicine contact a poison control center or emergency room at once. NOTE: This medicine is only for you. Do not share this medicine with others. What if I miss a dose? This does not apply. What may interact with this medicine? -chemotherapy or radiation therapy -medicines that lower your immune system like etanercept, anakinra, infliximab, and adalimumab -medicines that treat or prevent blood clots like warfarin -phenytoin -steroid medicines like prednisone or cortisone -theophylline -vaccines This list may not describe all possible interactions. Give your  health care provider a list of all the medicines, herbs, non-prescription drugs, or dietary supplements you use. Also tell them if you smoke, drink alcohol, or use illegal drugs. Some items may interact with your medicine. What should I watch for while using this medicine? Report any side effects that do not go away within 3 days to your doctor or health care professional. Call your health care provider if any unusual symptoms occur within 6 weeks of receiving this vaccine. You may still catch the flu, but the illness is not usually as bad. You cannot get the flu from the vaccine. The vaccine will not protect against colds or other illnesses that may cause fever. The vaccine is needed every year. What side effects may I notice from receiving this medicine? Side effects that you should report to your doctor or health care professional as soon as possible: -allergic reactions like skin rash, itching or hives, swelling of the face, lips, or tongue Side effects that usually do not require medical attention (report to your doctor or health care professional if they continue or are bothersome): -fever -headache -muscle aches and pains -pain, tenderness, redness, or swelling at site where injected -weak or tired This list may not describe all possible side effects. Call your doctor for medical advice about side effects. You may report side effects to FDA at 1-800-FDA-1088. Where should I keep my medicine? This vaccine is only given in a clinic, pharmacy, doctor's office, or other health care setting and will not be stored at home. NOTE: This sheet is a summary. It may not cover all possible information. If you have questions about this medicine, talk to your doctor, pharmacist, or health care provider.  2015, Elsevier/Gold Standard. (2008-04-17 09:30:40) Trastuzumab injection for infusion What  is this medicine? TRASTUZUMAB (tras TOO zoo mab) is a monoclonal antibody. It targets a protein called HER2.  This protein is found in some stomach and breast cancers. This medicine can stop cancer cell growth. This medicine may be used with other cancer treatments. This medicine may be used for other purposes; ask your health care provider or pharmacist if you have questions. COMMON BRAND NAME(S): Herceptin What should I tell my health care provider before I take this medicine? They need to know if you have any of these conditions: -heart disease -heart failure -infection (especially a virus infection such as chickenpox, cold sores, or herpes) -lung or breathing disease, like asthma -recent or ongoing radiation therapy -an unusual or allergic reaction to trastuzumab, benzyl alcohol, or other medications, foods, dyes, or preservatives -pregnant or trying to get pregnant -breast-feeding How should I use this medicine? This drug is given as an infusion into a vein. It is administered in a hospital or clinic by a specially trained health care professional. Talk to your pediatrician regarding the use of this medicine in children. This medicine is not approved for use in children. Overdosage: If you think you have taken too much of this medicine contact a poison control center or emergency room at once. NOTE: This medicine is only for you. Do not share this medicine with others. What if I miss a dose? It is important not to miss a dose. Call your doctor or health care professional if you are unable to keep an appointment. What may interact with this medicine? -cyclophosphamide -doxorubicin -warfarin This list may not describe all possible interactions. Give your health care provider a list of all the medicines, herbs, non-prescription drugs, or dietary supplements you use. Also tell them if you smoke, drink alcohol, or use illegal drugs. Some items may interact with your medicine. What should I watch for while using this medicine? Visit your doctor for checks on your progress. Report any side effects.  Continue your course of treatment even though you feel ill unless your doctor tells you to stop. Call your doctor or health care professional for advice if you get a fever, chills or sore throat, or other symptoms of a cold or flu. Do not treat yourself. Try to avoid being around people who are sick. You may experience fever, chills and shaking during your first infusion. These effects are usually mild and can be treated with other medicines. Report any side effects during the infusion to your health care professional. Fever and chills usually do not happen with later infusions. What side effects may I notice from receiving this medicine? Side effects that you should report to your doctor or other health care professional as soon as possible: -breathing difficulties -chest pain or palpitations -cough -dizziness or fainting -fever or chills, sore throat -skin rash, itching or hives -swelling of the legs or ankles -unusually weak or tired Side effects that usually do not require medical attention (report to your doctor or other health care professional if they continue or are bothersome): -loss of appetite -headache -muscle aches -nausea This list may not describe all possible side effects. Call your doctor for medical advice about side effects. You may report side effects to FDA at 1-800-FDA-1088. Where should I keep my medicine? This drug is given in a hospital or clinic and will not be stored at home. NOTE: This sheet is a summary. It may not cover all possible information. If you have questions about this medicine, talk to your doctor, pharmacist,  or health care provider.  2015, Elsevier/Gold Standard. (2009-07-25 13:43:15)

## 2014-08-01 ENCOUNTER — Telehealth: Payer: Self-pay | Admitting: Cardiology

## 2014-08-01 NOTE — Telephone Encounter (Signed)
Spoke with patient , questions answered

## 2014-08-01 NOTE — Telephone Encounter (Signed)
New message  ° ° °Returning call back to nurse.  °

## 2014-08-05 ENCOUNTER — Encounter: Payer: Self-pay | Admitting: Hematology & Oncology

## 2014-08-08 ENCOUNTER — Encounter: Payer: Self-pay | Admitting: Radiation Oncology

## 2014-08-12 ENCOUNTER — Other Ambulatory Visit: Payer: Self-pay | Admitting: Dermatology

## 2014-08-14 ENCOUNTER — Other Ambulatory Visit: Payer: Medicare Other | Admitting: Lab

## 2014-08-14 ENCOUNTER — Ambulatory Visit: Payer: Medicare Other

## 2014-08-14 ENCOUNTER — Ambulatory Visit (HOSPITAL_COMMUNITY)
Admission: RE | Admit: 2014-08-14 | Discharge: 2014-08-14 | Disposition: A | Discharge status: Home / Self Care | Payer: Medicare Other | Source: Ambulatory Visit | Attending: Hematology & Oncology | Admitting: Hematology & Oncology

## 2014-08-14 ENCOUNTER — Ambulatory Visit: Payer: Medicare Other | Admitting: Hematology & Oncology

## 2014-08-14 DIAGNOSIS — I2510 Atherosclerotic heart disease of native coronary artery without angina pectoris: Secondary | ICD-10-CM | POA: Diagnosis present

## 2014-08-14 DIAGNOSIS — I059 Rheumatic mitral valve disease, unspecified: Secondary | ICD-10-CM

## 2014-08-14 DIAGNOSIS — C50412 Malignant neoplasm of upper-outer quadrant of left female breast: Secondary | ICD-10-CM | POA: Diagnosis not present

## 2014-08-14 NOTE — Progress Notes (Signed)
  Echocardiogram 2D Echocardiogram has been performed.  Darlina Sicilian M 08/14/2014, 10:38 AM

## 2014-08-15 ENCOUNTER — Encounter: Payer: Self-pay | Admitting: *Deleted

## 2014-08-15 NOTE — Progress Notes (Signed)
Radiation Oncology         [phone] ________________________________  Name: Kim [name] MRN: [id number]  Date: 08/16/2014  DOB: Feb 17, 1935  Follow-Up Visit Note  outpatient  CC: Laurey Morale, MD  [name], MD  Diagnosis and Prior Radiotherapy:    ICD-9-CM ICD-10-CM   1. Breast cancer of upper-outer quadrant of left female breast 174.4 C50.412       [id number] Stage III left breast ductal carcinoma with 5 of 11 positive lymph nodes    Indication for treatment:  curative       Radiation treatment dates:   05/14/2014-06/26/2014  Site/dose:       1) Left Breast  / 50 Gy in 25 fractions 2) Left Supraclavicular fossa/ 47.5 Gy in 25 fractions 3) Left Posterior Axillary boost / 4.825 Gy in 25 fractions 4) Left Breast boost / 10 Gy in 5 fractions   Narrative:  The patient returns today for routine follow-up.  Doing very well.                              ALLERGIES:  is allergic to propoxyphene n-acetaminophen; penicillins; cortisone; myrbetriq; and pseudoephedrine hcl er.  Meds: Current Outpatient Prescriptions  Medication Sig Dispense Refill  . acetaminophen (TYLENOL) 650 MG CR tablet Take 650 mg by mouth every 8 (eight) hours as needed.    Marland Kitchen acidophilus (RISAQUAD) CAPS capsule Take 1 capsule by mouth daily.    . Ascorbic Acid (VITAMIN C) 1000 MG tablet Take 1,000 mg by mouth daily.    Marland Kitchen aspirin EC 81 MG EC tablet Take 1 tablet (81 mg total) by mouth daily.    . cetirizine (ZYRTEC) 10 MG tablet Take 10 mg by mouth daily as needed for allergies.    . Cyanocobalamin (VITAMIN B-12) 2500 MCG SUBL Place under the tongue as needed.    . furosemide (LASIX) 20 MG tablet Take 1 tablet (20 mg total) by mouth daily. Take 1 tablet (20 mg total) every day x 3 days then take once daily as needed 30 tablet 0  . letrozole (FEMARA) 2.5 MG tablet Take 2.5 mg by mouth daily.     Marland Kitchen levothyroxine (SYNTHROID, LEVOTHROID) 125 MCG tablet Take 1 tablet (125 mcg total) by mouth daily before  breakfast. 90 tablet 3  . lidocaine-prilocaine (EMLA) cream Apply topically as needed. 30 g 6  . Loperamide HCl (IMODIUM PO) Take by mouth as needed.    . metoprolol succinate (TOPROL XL) 50 MG 24 hr tablet Take 1 tablet (50 mg total) by mouth 2 (two) times daily. Take with or immediately following a meal. 60 tablet 6  . Multiple Vitamins-Minerals (CENTRUM ADULTS PO) Take by mouth.    . nitroGLYCERIN (NITROSTAT) 0.4 MG SL tablet Place 1 tablet (0.4 mg total) under the tongue every 5 (five) minutes x 3 doses as needed for chest pain. 25 tablet 3  . rosuvastatin (CRESTOR) 10 MG tablet Take 1 tablet (10 mg total) by mouth daily. 30 tablet 3  . Calcium Citrate-Vitamin D (CITRACAL MAXIMUM PO) Take 1 tablet by mouth daily.     . [DISCONTINUED] ferrous sulfate 325 (65 FE) MG tablet Take 325 mg by mouth 2 (two) times daily.       No current facility-administered medications for this encounter.    Physical Findings: The patient is in no acute distress. Patient is alert and oriented.  height is 5\' 7"  (1.702 m) and weight is  172 lb 1.6 oz (78.064 kg). Her temperature is 98.5 F (36.9 C). Her blood pressure is 147/65 and her pulse is 78. Marland Kitchen   Left breast/ lower neck skin has almost completely healed with hardly any residual signs of radiotherapy. Excellent cosmesis.   Lab Findings: Lab Results  Component Value Date   WBC 3.9 07/31/2014   HGB 12.1 07/31/2014   HCT 36.2 07/31/2014   MCV 89 07/31/2014   PLT 130* 07/31/2014    Radiographic Findings: No results found.  Impression/Plan:  I encouraged her to continue with yearly mammography and followup with medical oncology. I will see her back on an as-needed basis. I have encouraged her to call if she has any issues or concerns in the future. I wished her the very best. [facility] brochure given today.    _____________________________________   Eppie Gibson, MD

## 2014-08-16 ENCOUNTER — Encounter: Payer: Self-pay | Admitting: Radiation Oncology

## 2014-08-16 ENCOUNTER — Ambulatory Visit
Admission: RE | Admit: 2014-08-16 | Discharge: 2014-08-16 | Disposition: A | Discharge status: Home / Self Care | Payer: Medicare Other | Source: Ambulatory Visit | Attending: Radiation Oncology | Admitting: Radiation Oncology

## 2014-08-16 VITALS — BP 147/65 | HR 78 | Temp 98.5°F | Ht 67.0 in | Wt 172.1 lb

## 2014-08-16 DIAGNOSIS — C50412 Malignant neoplasm of upper-outer quadrant of left female breast: Secondary | ICD-10-CM

## 2014-08-16 HISTORY — DX: Personal history of irradiation: Z92.3

## 2014-08-16 NOTE — Progress Notes (Signed)
Ms. Terlecki here for reassessment s/p radiation therapy to her left breast.  Denies  pain, but reports an occasional warmth sensation of her breast.

## 2014-08-21 ENCOUNTER — Ambulatory Visit (HOSPITAL_BASED_OUTPATIENT_CLINIC_OR_DEPARTMENT_OTHER): Payer: Medicare Other | Admitting: Family

## 2014-08-21 ENCOUNTER — Encounter: Payer: Self-pay | Admitting: Family

## 2014-08-21 ENCOUNTER — Other Ambulatory Visit (HOSPITAL_BASED_OUTPATIENT_CLINIC_OR_DEPARTMENT_OTHER): Payer: Medicare Other | Admitting: Lab

## 2014-08-21 ENCOUNTER — Ambulatory Visit (HOSPITAL_BASED_OUTPATIENT_CLINIC_OR_DEPARTMENT_OTHER): Payer: Medicare Other

## 2014-08-21 ENCOUNTER — Telehealth: Payer: Self-pay | Admitting: Hematology & Oncology

## 2014-08-21 DIAGNOSIS — C50919 Malignant neoplasm of unspecified site of unspecified female breast: Secondary | ICD-10-CM

## 2014-08-21 DIAGNOSIS — C773 Secondary and unspecified malignant neoplasm of axilla and upper limb lymph nodes: Secondary | ICD-10-CM

## 2014-08-21 DIAGNOSIS — C50412 Malignant neoplasm of upper-outer quadrant of left female breast: Secondary | ICD-10-CM

## 2014-08-21 DIAGNOSIS — Z170 Estrogen receptor positive status [ER+]: Secondary | ICD-10-CM

## 2014-08-21 DIAGNOSIS — Z5112 Encounter for antineoplastic immunotherapy: Secondary | ICD-10-CM

## 2014-08-21 DIAGNOSIS — I2510 Atherosclerotic heart disease of native coronary artery without angina pectoris: Secondary | ICD-10-CM

## 2014-08-21 LAB — CMP (CANCER CENTER ONLY)
ALT(SGPT): 19 U/L (ref 10–47)
AST: 21 U/L (ref 11–38)
Albumin: 3.7 g/dL (ref 3.3–5.5)
Alkaline Phosphatase: 41 U/L (ref 26–84)
BUN, Bld: 18 mg/dL (ref 7–22)
CO2: 26 mEq/L (ref 18–33)
Calcium: 9.2 mg/dL (ref 8.0–10.3)
Chloride: 99 mEq/L (ref 98–108)
Creat: 1.1 mg/dl (ref 0.6–1.2)
Glucose, Bld: 127 mg/dL — ABNORMAL HIGH (ref 73–118)
Potassium: 4 mEq/L (ref 3.3–4.7)
Sodium: 141 mEq/L (ref 128–145)
Total Bilirubin: 0.9 mg/dl (ref 0.20–1.60)
Total Protein: 6.9 g/dL (ref 6.4–8.1)

## 2014-08-21 LAB — CBC WITH DIFFERENTIAL (CANCER CENTER ONLY)
BASO#: 0 10*3/uL (ref 0.0–0.2)
BASO%: 0.2 % (ref 0.0–2.0)
EOS%: 2.9 % (ref 0.0–7.0)
Eosinophils Absolute: 0.1 10*3/uL (ref 0.0–0.5)
HCT: 35.5 % (ref 34.8–46.6)
HGB: 11.7 g/dL (ref 11.6–15.9)
LYMPH#: 0.7 10*3/uL — ABNORMAL LOW (ref 0.9–3.3)
LYMPH%: 15.7 % (ref 14.0–48.0)
MCH: 29.8 pg (ref 26.0–34.0)
MCHC: 33 g/dL (ref 32.0–36.0)
MCV: 90 fL (ref 81–101)
MONO#: 0.4 10*3/uL (ref 0.1–0.9)
MONO%: 10 % (ref 0.0–13.0)
NEUT#: 3 10*3/uL (ref 1.5–6.5)
NEUT%: 71.2 % (ref 39.6–80.0)
Platelets: 128 10*3/uL — ABNORMAL LOW (ref 145–400)
RBC: 3.93 10*6/uL (ref 3.70–5.32)
RDW: 14.2 % (ref 11.1–15.7)
WBC: 4.2 10*3/uL (ref 3.9–10.0)

## 2014-08-21 MED ORDER — ACETAMINOPHEN 325 MG PO TABS
650.0000 mg | ORAL_TABLET | Freq: Once | ORAL | Status: AC
  Administered 2014-08-21: 650 mg via ORAL

## 2014-08-21 MED ORDER — SODIUM CHLORIDE 0.9 % IV SOLN
Freq: Once | INTRAVENOUS | Status: AC
  Administered 2014-08-21: 11:00:00 via INTRAVENOUS

## 2014-08-21 MED ORDER — ACETAMINOPHEN 325 MG PO TABS
ORAL_TABLET | ORAL | Status: AC
  Filled 2014-08-21: qty 2

## 2014-08-21 MED ORDER — DIPHENHYDRAMINE HCL 25 MG PO CAPS
50.0000 mg | ORAL_CAPSULE | Freq: Once | ORAL | Status: AC
  Administered 2014-08-21: 50 mg via ORAL

## 2014-08-21 MED ORDER — HEPARIN SOD (PORK) LOCK FLUSH 100 UNIT/ML IV SOLN
500.0000 [IU] | Freq: Once | INTRAVENOUS | Status: AC | PRN
  Administered 2014-08-21: 500 [IU]
  Filled 2014-08-21: qty 5

## 2014-08-21 MED ORDER — DIPHENHYDRAMINE HCL 25 MG PO CAPS
ORAL_CAPSULE | ORAL | Status: AC
  Filled 2014-08-21: qty 2

## 2014-08-21 MED ORDER — TRASTUZUMAB CHEMO INJECTION 440 MG
6.0000 mg/kg | Freq: Once | INTRAVENOUS | Status: AC
  Administered 2014-08-21: 462 mg via INTRAVENOUS
  Filled 2014-08-21: qty 22

## 2014-08-21 MED ORDER — SODIUM CHLORIDE 0.9 % IJ SOLN
10.0000 mL | INTRAMUSCULAR | Status: DC | PRN
  Administered 2014-08-21: 10 mL
  Filled 2014-08-21: qty 10

## 2014-08-21 NOTE — Progress Notes (Signed)
[place]  [phone] [phone]) [phone]  ID: JAYLENNE HAMELIN OB: 02/04/1935 MR#: [id number] Patient Care Team: Laurey Morale, MD as PCP - General  DIAGNOSIS: Stage IIA (T1N1M0) adenocarcinoma of the left breast-triple positive  INTERVAL HISTORY: Ms. Bujak is here today for follow-up. She has done very well with treatment and has had no complications.  She denies fever, chills, n/v, cough, rash, headache, dizziness, SOB, chest pain, palpitations, abdominal pain, constipation, diarrhea, blood in urine or stool. No bleeding or pain. She has had no swelling or tenderness in her extremities. Her neuropathy in her feet is the same and she states that it is tolerable.  Her appetite is good and she is drinking plenty of fluids. Her weight is stable. In November, her echo showed an ejection fraction of 59%. She had 2 moles removed one from her head and one from her left eyebrow. These were both benign.   CURRENT TREATMENT: Status post cycle 4 of Abraxane/Herceptin  Maintenance Herceptin q 3wk  Radiation therapy to the left breast completed Zometa 4 mg IV every 6 months  Femara 2.5 mg by mouth daily (started 07/01/14)  REVIEW OF SYSTEMS: All other 10 point review of systems is negative.   PAST MEDICAL HISTORY: Past Medical History  Diagnosis Date  . Allergy   . Allergic rhinitis   . Hypothyroidism   . Urinary tract infection   . Diverticulosis   . Hx of adenomatous colonic polyps 02/1999  . Tubulovillous adenoma of colon 12/2009    with HGD  . Low back pain     gets ESI from Dr. Rennis Harding  . Arthritis   . Hypertension   . HOH (hard of hearing)   . Wears hearing aid   . Breast cancer of upper-outer quadrant of left female breast 11/08/13  . CAD (coronary artery disease)     a. 12/2013 Cath/PCI: LM nl, LAD 20p, 66m, 30d, D1 30p, LCX 20p, 65m, OM1 20, Mo2 40p, RCA 30p, 15m (4.0x16 Promus Premier DES), 20d, RPL 30, RPDA 70p, 34m, EF 65-70%.  .  Hyperlipidemia   . S/P radiation therapy 05/14/2014-06/26/2014    1) Left Breast  / 50 Gy in 25 fractions, 2) Left Supraclavicular fossa/ 47.5 Gy in 25 fractions, 3) Left Posterior Axillary boost / 4.825 Gy in 25 fractions, 4) Left Breast boost / 10 Gy in 5 fractions     PAST SURGICAL HISTORY: Past Surgical History  Procedure Laterality Date  . Abdominal hysterectomy    . Bilateral salpingoophorectomy      see Laurin Coder NP for GYN exams  . Cataract extraction w/ intraocular lens  implant, bilateral  2012    bilateral  . Colonoscopy  01-14-12    per Dr. Fuller Plan, adenomatous polyps, repeat in 3 years   . Breast lumpectomy with needle localization and axillary lymph node dissection Left 11/05/2013    Procedure: LEFT BREAST LUMPECTOMY WITH NEEDLE LOCALIZATION and axillary lymph Node Dissection;  Surgeon: Edward Jolly, MD;  Location: [place];  Service: General;  Laterality: Left;  . Breast surgery    . Portacath placement Right 12/11/2013    Procedure: INSERTION PORT-A-CATH;  Surgeon: Edward Jolly, MD;  Location: [place];  Service: General;  Laterality: Right;  Subclavian Vein;    FAMILY HISTORY Family History  Problem Relation Age of Onset  . Alcohol abuse Father   . Kidney failure Mother   . Colon cancer  family   GYNECOLOGIC HISTORY:  Patient's last menstrual period was 10/19/1991.   SOCIAL HISTORY:  History   Social History  . Marital Status: Married    Spouse Name: N/A    Number of Children: 2  . Years of Education: N/A   Occupational History  . Retired-school system    Social History Main Topics  . Smoking status: Never Smoker   . Smokeless tobacco: Never Used     Comment: never used tobacco  . Alcohol Use: No  . Drug Use: No  . Sexual Activity: Not Currently   Other Topics Concern  . Not on file   Social History Narrative   ADVANCED DIRECTIVES: <no information>  HEALTH MAINTENANCE: History  Substance Use  Topics  . Smoking status: Never Smoker   . Smokeless tobacco: Never Used     Comment: never used tobacco  . Alcohol Use: No   Colonoscopy: PAP: Bone density: Lipid panel:  Allergies  Allergen Reactions  . Propoxyphene N-Acetaminophen Swelling    tongue swelling  . Penicillins Swelling    Facial swelling  . Cortisone Rash  . Myrbetriq [Mirabegron]     Rash on face  . Pseudoephedrine Hcl Er Rash and Other (See Comments)    Face gets red   Current Outpatient Prescriptions  Medication Sig Dispense Refill  . acetaminophen (TYLENOL) 650 MG CR tablet Take 650 mg by mouth every 8 (eight) hours as needed.    Marland Kitchen acidophilus (RISAQUAD) CAPS capsule Take 1 capsule by mouth daily.    . Ascorbic Acid (VITAMIN C) 1000 MG tablet Take 1,000 mg by mouth daily.    Marland Kitchen aspirin EC 81 MG EC tablet Take 1 tablet (81 mg total) by mouth daily.    . Calcium Citrate-Vitamin D (CITRACAL MAXIMUM PO) Take 1 tablet by mouth daily.     . cetirizine (ZYRTEC) 10 MG tablet Take 10 mg by mouth daily as needed for allergies.    . Cyanocobalamin (VITAMIN B-12) 2500 MCG SUBL Place under the tongue as needed.    . furosemide (LASIX) 20 MG tablet Take 1 tablet (20 mg total) by mouth daily. Take 1 tablet (20 mg total) every day x 3 days then take once daily as needed 30 tablet 0  . letrozole (FEMARA) 2.5 MG tablet Take 2.5 mg by mouth daily.     Marland Kitchen levothyroxine (SYNTHROID, LEVOTHROID) 125 MCG tablet Take 1 tablet (125 mcg total) by mouth daily before breakfast. 90 tablet 3  . lidocaine-prilocaine (EMLA) cream Apply topically as needed. 30 g 6  . Loperamide HCl (IMODIUM PO) Take by mouth as needed.    . metoprolol succinate (TOPROL XL) 50 MG 24 hr tablet Take 1 tablet (50 mg total) by mouth 2 (two) times daily. Take with or immediately following a meal. 60 tablet 6  . Multiple Vitamins-Minerals (CENTRUM ADULTS PO) Take by mouth.    . nitroGLYCERIN (NITROSTAT) 0.4 MG SL tablet Place 1 tablet (0.4 mg total) under the tongue  every 5 (five) minutes x 3 doses as needed for chest pain. 25 tablet 3  . rosuvastatin (CRESTOR) 10 MG tablet Take 1 tablet (10 mg total) by mouth daily. 30 tablet 3  . [DISCONTINUED] ferrous sulfate 325 (65 FE) MG tablet Take 325 mg by mouth 2 (two) times daily.       No current facility-administered medications for this visit.   OBJECTIVE: Filed Vitals:   08/21/14 0933  BP: 120/62  Pulse: 78  Temp: 97.8 F (36.6 C)  Resp: 20   Body mass index is 26.01 kg/(m^2). ECOG FS:0 - Asymptomatic Ocular: Sclerae unicteric, pupils equal, round and reactive to light Ear-nose-throat: Oropharynx clear, dentition fair Lymphatic: No cervical or supraclavicular adenopathy Lungs no rales or rhonchi, good excursion bilaterally Heart regular rate and rhythm, no murmur appreciated Abd soft, nontender, positive bowel sounds MSK no focal spinal tenderness, no joint edema Neuro: non-focal, well-oriented, appropriate affect Breasts: No changes, no lumps, bumps, rashes, no lymphedema.   LAB RESULTS: CMP     Component Value Date/Time   NA 143 07/31/2014 0901   NA 140 07/24/2014 0900   NA 137 01/28/2014 1354   K 3.4 07/31/2014 0901   K 4.2 07/24/2014 0900   K 4.3 01/28/2014 1354   CL 103 07/31/2014 0901   CL 105 07/24/2014 0900   CO2 27 07/31/2014 0901   CO2 23 07/24/2014 0900   CO2 21* 01/28/2014 1354   GLUCOSE 85 07/31/2014 0901   GLUCOSE 85 07/24/2014 0900   GLUCOSE 156* 01/28/2014 1354   BUN 14 07/31/2014 0901   BUN 17 07/24/2014 0900   BUN 35.3* 01/28/2014 1354   CREATININE 1.1 07/31/2014 0901   CREATININE 1.0 07/24/2014 0900   CREATININE 1.3* 01/28/2014 1354   CALCIUM 9.2 07/31/2014 0901   CALCIUM 9.2 07/24/2014 0900   CALCIUM 9.4 01/28/2014 1354   PROT 6.5 07/31/2014 0901   PROT 6.0 04/18/2014 1319   PROT 6.7 01/28/2014 1354   ALBUMIN 3.7 04/18/2014 1319   ALBUMIN 3.7 01/28/2014 1354   AST 15 07/31/2014 0901   AST 16 04/18/2014 1319   AST 10 01/28/2014 1354   ALT 14 07/31/2014  0901   ALT 19 04/18/2014 1319   ALT 13 01/28/2014 1354   ALKPHOS 46 07/31/2014 0901   ALKPHOS 59 04/18/2014 1319   ALKPHOS 63 01/28/2014 1354   BILITOT 0.80 07/31/2014 0901   BILITOT 0.8 04/18/2014 1319   BILITOT 0.76 01/28/2014 1354   GFRNONAA 65* 12/20/2013 0154   GFRAA 76* 12/20/2013 0154   No results found for: SPEP Lab Results  Component Value Date   WBC 4.2 08/21/2014   NEUTROABS 3.0 08/21/2014   HGB 11.7 08/21/2014   HCT 35.5 08/21/2014   MCV 90 08/21/2014   PLT 128* 08/21/2014   No results found for: LABCA2 No components found for: LABCA125 No results for input(s): INR in the last 168 hours.  STUDIES: No results found.  ASSESSMENT/PLAN: Ms. Neth is 79 year old white female with stage IIA ductal carcinoma of the left breast. She had 4 positive lymph nodes. Her breast cancer was triple positive. She completed her adjuvant chemotherapy in July 2015. She has completed radiation to the left breast. She is doing well with Herceptin. No complications. She is asymptomatic today. Her CBC and CMP today were ok. We will proceed with treatment today as planned.  She will continue to get Zometa every 6 months.  She will continue taking the Femara.  She wants so skip her 12/9 appointment and treatment. I spoke with Dr. Marin Olp about this and he thinks that would be fine.  She will get a new treatment schedule today.  She knows to call here with any questions or concerns and to go to the ED in the even of an emergency. We can certainly see her sooner if need be.   Eliezer Bottom, NP 08/21/2014 9:37 AM

## 2014-08-21 NOTE — Telephone Encounter (Signed)
Pt cx 12-9 is keeping 12-30 per pt and Judson Roch NP

## 2014-08-21 NOTE — Patient Instructions (Signed)

## 2014-08-22 ENCOUNTER — Telehealth: Payer: Self-pay | Admitting: Cardiology

## 2014-08-23 NOTE — Telephone Encounter (Signed)
Received request from Nurse fax box:   To: [facility] Fax number: [phone]

## 2014-09-11 ENCOUNTER — Ambulatory Visit: Payer: Medicare Other

## 2014-09-11 ENCOUNTER — Ambulatory Visit: Payer: Medicare Other | Admitting: Hematology & Oncology

## 2014-09-11 ENCOUNTER — Other Ambulatory Visit: Payer: Medicare Other | Admitting: Lab

## 2014-09-12 ENCOUNTER — Encounter (HOSPITAL_COMMUNITY): Payer: Self-pay | Admitting: Cardiovascular Disease

## 2014-09-24 ENCOUNTER — Other Ambulatory Visit (INDEPENDENT_AMBULATORY_CARE_PROVIDER_SITE_OTHER): Payer: Medicare Other | Admitting: *Deleted

## 2014-09-24 DIAGNOSIS — E785 Hyperlipidemia, unspecified: Secondary | ICD-10-CM

## 2014-09-24 LAB — LIPID PANEL
Cholesterol: 142 mg/dL (ref 0–200)
HDL: 28.1 mg/dL — ABNORMAL LOW (ref >39.00)
LDL Cholesterol: 80 mg/dL (ref 0–99)
NonHDL: 113.9
Total CHOL/HDL Ratio: 5
Triglycerides: 171 mg/dL — ABNORMAL HIGH (ref 0.0–149.0)
VLDL: 34.2 mg/dL (ref 0.0–40.0)

## 2014-09-24 LAB — HEPATIC FUNCTION PANEL
ALT: 11 U/L (ref 0–35)
AST: 17 U/L (ref 0–37)
Albumin: 3.8 g/dL (ref 3.5–5.2)
Alkaline Phosphatase: 48 U/L (ref 39–117)
Bilirubin, Direct: 0.1 mg/dL (ref 0.0–0.3)
Total Bilirubin: 0.7 mg/dL (ref 0.2–1.2)
Total Protein: 6.2 g/dL (ref 6.0–8.3)

## 2014-09-25 ENCOUNTER — Telehealth: Payer: Self-pay | Admitting: Cardiology

## 2014-09-25 NOTE — Telephone Encounter (Signed)
I spoke with the patient. 

## 2014-09-25 NOTE — Telephone Encounter (Signed)
New Message  Pt returning Ann's phone call about lab results. Please call back and discuss.

## 2014-10-02 ENCOUNTER — Other Ambulatory Visit (HOSPITAL_BASED_OUTPATIENT_CLINIC_OR_DEPARTMENT_OTHER): Payer: Medicare Other | Admitting: Lab

## 2014-10-02 ENCOUNTER — Ambulatory Visit (HOSPITAL_BASED_OUTPATIENT_CLINIC_OR_DEPARTMENT_OTHER): Payer: Medicare Other | Admitting: Family

## 2014-10-02 ENCOUNTER — Ambulatory Visit (HOSPITAL_BASED_OUTPATIENT_CLINIC_OR_DEPARTMENT_OTHER): Payer: Medicare Other

## 2014-10-02 DIAGNOSIS — C773 Secondary and unspecified malignant neoplasm of axilla and upper limb lymph nodes: Secondary | ICD-10-CM

## 2014-10-02 DIAGNOSIS — Z5112 Encounter for antineoplastic immunotherapy: Secondary | ICD-10-CM

## 2014-10-02 DIAGNOSIS — C50412 Malignant neoplasm of upper-outer quadrant of left female breast: Secondary | ICD-10-CM

## 2014-10-02 DIAGNOSIS — Z170 Estrogen receptor positive status [ER+]: Secondary | ICD-10-CM

## 2014-10-02 DIAGNOSIS — C50919 Malignant neoplasm of unspecified site of unspecified female breast: Secondary | ICD-10-CM

## 2014-10-02 LAB — CBC WITH DIFFERENTIAL (CANCER CENTER ONLY)
BASO#: 0 10*3/uL (ref 0.0–0.2)
BASO%: 0.3 % (ref 0.0–2.0)
EOS%: 3 % (ref 0.0–7.0)
Eosinophils Absolute: 0.1 10*3/uL (ref 0.0–0.5)
HCT: 32.7 % — ABNORMAL LOW (ref 34.8–46.6)
HGB: 10.9 g/dL — ABNORMAL LOW (ref 11.6–15.9)
LYMPH#: 0.6 10*3/uL — ABNORMAL LOW (ref 0.9–3.3)
LYMPH%: 14.8 % (ref 14.0–48.0)
MCH: 30.7 pg (ref 26.0–34.0)
MCHC: 33.3 g/dL (ref 32.0–36.0)
MCV: 92 fL (ref 81–101)
MONO#: 0.3 10*3/uL (ref 0.1–0.9)
MONO%: 8.6 % (ref 0.0–13.0)
NEUT#: 2.7 10*3/uL (ref 1.5–6.5)
NEUT%: 73.3 % (ref 39.6–80.0)
Platelets: 128 10*3/uL — ABNORMAL LOW (ref 145–400)
RBC: 3.55 10*6/uL — ABNORMAL LOW (ref 3.70–5.32)
RDW: 13.5 % (ref 11.1–15.7)
WBC: 3.7 10*3/uL — ABNORMAL LOW (ref 3.9–10.0)

## 2014-10-02 LAB — CMP (CANCER CENTER ONLY)
ALT(SGPT): 15 U/L (ref 10–47)
AST: 18 U/L (ref 11–38)
Albumin: 3.5 g/dL (ref 3.3–5.5)
Alkaline Phosphatase: 47 U/L (ref 26–84)
BUN, Bld: 19 mg/dL (ref 7–22)
CO2: 26 mEq/L (ref 18–33)
Calcium: 9.1 mg/dL (ref 8.0–10.3)
Chloride: 105 mEq/L (ref 98–108)
Creat: 1 mg/dl (ref 0.6–1.2)
Glucose, Bld: 130 mg/dL — ABNORMAL HIGH (ref 73–118)
Potassium: 3.5 mEq/L (ref 3.3–4.7)
Sodium: 143 mEq/L (ref 128–145)
Total Bilirubin: 0.8 mg/dl (ref 0.20–1.60)
Total Protein: 6.5 g/dL (ref 6.4–8.1)

## 2014-10-02 MED ORDER — SODIUM CHLORIDE 0.9 % IJ SOLN
10.0000 mL | INTRAMUSCULAR | Status: DC | PRN
  Administered 2014-10-02: 10 mL
  Filled 2014-10-02: qty 10

## 2014-10-02 MED ORDER — ACETAMINOPHEN 325 MG PO TABS
650.0000 mg | ORAL_TABLET | Freq: Once | ORAL | Status: AC
  Administered 2014-10-02: 650 mg via ORAL

## 2014-10-02 MED ORDER — ACETAMINOPHEN 325 MG PO TABS
ORAL_TABLET | ORAL | Status: AC
  Filled 2014-10-02: qty 2

## 2014-10-02 MED ORDER — SODIUM CHLORIDE 0.9 % IV SOLN
Freq: Once | INTRAVENOUS | Status: AC
  Administered 2014-10-02: 11:00:00 via INTRAVENOUS

## 2014-10-02 MED ORDER — HEPARIN SOD (PORK) LOCK FLUSH 100 UNIT/ML IV SOLN
500.0000 [IU] | Freq: Once | INTRAVENOUS | Status: AC | PRN
  Administered 2014-10-02: 500 [IU]
  Filled 2014-10-02: qty 5

## 2014-10-02 MED ORDER — TRASTUZUMAB CHEMO INJECTION 440 MG
6.0000 mg/kg | Freq: Once | INTRAVENOUS | Status: AC
  Administered 2014-10-02: 462 mg via INTRAVENOUS
  Filled 2014-10-02: qty 22

## 2014-10-02 MED ORDER — DIPHENHYDRAMINE HCL 25 MG PO CAPS
ORAL_CAPSULE | ORAL | Status: AC
Start: 2014-10-02 — End: 2014-10-02
  Filled 2014-10-02: qty 2

## 2014-10-02 MED ORDER — DIPHENHYDRAMINE HCL 25 MG PO CAPS
50.0000 mg | ORAL_CAPSULE | Freq: Once | ORAL | Status: AC
  Administered 2014-10-02: 50 mg via ORAL

## 2014-10-02 NOTE — Patient Instructions (Signed)

## 2014-10-02 NOTE — Progress Notes (Signed)
[place]  Telephone:(336) [phone] Fax:(336) [phone]  ID: DELAYNE SANZO OB: [birth year] MR#: [id number] Patient Care Team: Laurey Morale, MD as PCP - General  DIAGNOSIS: Stage IIA (T1N1M0) adenocarcinoma of the left breast-triple positive  INTERVAL HISTORY: Kim [name] is here today for follow-up. She has done very well with treatment and has had no complications.  She denies fever, chills, n/v, cough, rash, headache, dizziness, SOB, chest pain, palpitations, abdominal pain, constipation, diarrhea, blood in urine or stool. No bleeding or pain. She states that her skin is "getting thinner." She has had a few small skin tears. She has had no swelling or tenderness in her extremities. Her neuropathy in her feet is the same and she states that it is tolerable.  Her appetite is good and she is drinking plenty of fluids. Her weight is stable at 171 lbs. In November, her echo showed an ejection fraction of 59%. She had 2 moles removed one from her head and one from her left eyebrow. These were both benign.   CURRENT TREATMENT: Status post cycle 4 of Abraxane/Herceptin  Maintenance Herceptin q 3wk  Radiation therapy to the left breast completed Zometa 4 mg IV every 6 months  Femara 2.5 mg by mouth daily (started 07/01/14)  REVIEW OF SYSTEMS: All other 10 point review of systems is negative.   PAST MEDICAL HISTORY: Past Medical History  Diagnosis Date  . Allergy   . Allergic rhinitis   . Hypothyroidism   . Urinary tract infection   . Diverticulosis   . Hx of adenomatous colonic polyps 02/1999  . Tubulovillous adenoma of colon 12/2009    with HGD  . Low back pain     gets ESI from Dr. Rennis Harding  . Arthritis   . Hypertension   . HOH (hard of hearing)   . Wears hearing aid   . Breast cancer of upper-outer quadrant of left female breast 11/08/13  . CAD (coronary artery disease)     a. 12/2013 Cath/PCI: LM nl, LAD 20p, 50m, 30d, D1 30p, LCX 20p, 69m, OM1 20,  Mo2 40p, RCA 30p, 61m (4.0x16 Promus Premier DES), 20d, RPL 30, RPDA 70p, 66m, EF 65-70%.  . Hyperlipidemia   . S/P radiation therapy 05/14/2014-06/26/2014    1) Left Breast  / 50 Gy in 25 fractions, 2) Left Supraclavicular fossa/ 47.5 Gy in 25 fractions, 3) Left Posterior Axillary boost / 4.825 Gy in 25 fractions, 4) Left Breast boost / 10 Gy in 5 fractions     PAST SURGICAL HISTORY: Past Surgical History  Procedure Laterality Date  . Abdominal hysterectomy    . Bilateral salpingoophorectomy      see Laurin Coder NP for GYN exams  . Cataract extraction w/ intraocular lens  implant, bilateral  2012    bilateral  . Colonoscopy  01-14-12    per Dr. Fuller Plan, adenomatous polyps, repeat in 3 years   . Breast lumpectomy with needle localization and axillary lymph node dissection Left 11/05/2013    Procedure: LEFT BREAST LUMPECTOMY WITH NEEDLE LOCALIZATION and axillary lymph Node Dissection;  Surgeon: Edward Jolly, MD;  Location: [place];  Service: General;  Laterality: Left;  . Breast surgery    . Portacath placement Right 12/11/2013    Procedure: INSERTION PORT-A-CATH;  Surgeon: Edward Jolly, MD;  Location: [place];  Service: General;  Laterality: Right;  Subclavian Vein;   . Left heart catheterization with coronary angiogram N/A 12/19/2013  Procedure: LEFT HEART CATHETERIZATION WITH CORONARY ANGIOGRAM;  Surgeon: Burnell Blanks, MD;  Location: [facility] CATH LAB;  Service: Cardiovascular;  Laterality: N/A;   FAMILY HISTORY Family History  Problem Relation Age of Onset  . Alcohol abuse Father   . Kidney failure Mother   . Colon cancer      family   GYNECOLOGIC HISTORY:  Patient's last menstrual period was 10/19/1991.   SOCIAL HISTORY:  History   Social History  . Marital Status: Married    Spouse Name: N/A    Number of Children: 2  . Years of Education: N/A   Occupational History  . Retired-school system    Social History Main Topics   . Smoking status: Never Smoker   . Smokeless tobacco: Never Used     Comment: never used tobacco  . Alcohol Use: No  . Drug Use: No  . Sexual Activity: Not Currently   Other Topics Concern  . Not on file   Social History Narrative   ADVANCED DIRECTIVES: <no information>  HEALTH MAINTENANCE: History  Substance Use Topics  . Smoking status: Never Smoker   . Smokeless tobacco: Never Used     Comment: never used tobacco  . Alcohol Use: No   Colonoscopy: PAP: Bone density: Lipid panel:  Allergies  Allergen Reactions  . Propoxyphene N-Acetaminophen Swelling    tongue swelling  . Penicillins Swelling    Facial swelling  . Cortisone Rash  . Myrbetriq [Mirabegron]     Rash on face  . Pseudoephedrine Hcl Er Rash and Other (See Comments)    Face gets red   Current Outpatient Prescriptions  Medication Sig Dispense Refill  . acetaminophen (TYLENOL) 650 MG CR tablet Take 650 mg by mouth every 8 (eight) hours as needed.    Marland Kitchen acidophilus (RISAQUAD) CAPS capsule Take 1 capsule by mouth daily.    . Ascorbic Acid (VITAMIN C) 1000 MG tablet Take 1,000 mg by mouth daily.    Marland Kitchen aspirin EC 81 MG EC tablet Take 1 tablet (81 mg total) by mouth daily.    . Calcium Citrate-Vitamin D (CITRACAL MAXIMUM PO) Take 1 tablet by mouth daily.     . cetirizine (ZYRTEC) 10 MG tablet Take 10 mg by mouth daily as needed for allergies.    . Cyanocobalamin (VITAMIN B-12) 2500 MCG SUBL Place under the tongue as needed.    . furosemide (LASIX) 20 MG tablet Take 1 tablet (20 mg total) by mouth daily. Take 1 tablet (20 mg total) every day x 3 days then take once daily as needed 30 tablet 0  . letrozole (FEMARA) 2.5 MG tablet Take 2.5 mg by mouth daily.     Marland Kitchen levothyroxine (SYNTHROID, LEVOTHROID) 125 MCG tablet Take 1 tablet (125 mcg total) by mouth daily before breakfast. 90 tablet 3  . lidocaine-prilocaine (EMLA) cream Apply topically as needed. 30 g 6  . Loperamide HCl (IMODIUM PO) Take by mouth as needed.     . metoprolol succinate (TOPROL XL) 50 MG 24 hr tablet Take 1 tablet (50 mg total) by mouth 2 (two) times daily. Take with or immediately following a meal. 60 tablet 6  . Multiple Vitamins-Minerals (CENTRUM ADULTS PO) Take by mouth.    . nitroGLYCERIN (NITROSTAT) 0.4 MG SL tablet Place 1 tablet (0.4 mg total) under the tongue every 5 (five) minutes x 3 doses as needed for chest pain. 25 tablet 3  . rosuvastatin (CRESTOR) 10 MG tablet Take 1 tablet (10 mg total) by mouth daily.  30 tablet 3  . [DISCONTINUED] ferrous sulfate 325 (65 FE) MG tablet Take 325 mg by mouth 2 (two) times daily.       No current facility-administered medications for this visit.   OBJECTIVE: Filed Vitals:   10/02/14 0920  BP: 112/67  Pulse: 69  Temp: 97.7 F (36.5 C)  Resp: 18   Body mass index is 26.01 kg/(m^2). ECOG FS:0 - Asymptomatic Ocular: Sclerae unicteric, pupils equal, round and reactive to light Ear-nose-throat: Oropharynx clear, dentition fair Lymphatic: No cervical or supraclavicular adenopathy Lungs no rales or rhonchi, good excursion bilaterally Heart regular rate and rhythm, no murmur appreciated Abd soft, nontender, positive bowel sounds MSK no focal spinal tenderness, no joint edema Neuro: non-focal, well-oriented, appropriate affect Breasts: No changes, no lumps, bumps, rashes, no lymphedema.   LAB RESULTS: CMP     Component Value Date/Time   NA 141 08/21/2014 0900   NA 140 07/24/2014 0900   NA 137 01/28/2014 1354   K 4.0 08/21/2014 0900   K 4.2 07/24/2014 0900   K 4.3 01/28/2014 1354   CL 99 08/21/2014 0900   CL 105 07/24/2014 0900   CO2 26 08/21/2014 0900   CO2 23 07/24/2014 0900   CO2 21* 01/28/2014 1354   GLUCOSE 127* 08/21/2014 0900   GLUCOSE 85 07/24/2014 0900   GLUCOSE 156* 01/28/2014 1354   BUN 18 08/21/2014 0900   BUN 17 07/24/2014 0900   BUN 35.3* 01/28/2014 1354   CREATININE 1.1 08/21/2014 0900   CREATININE 1.0 07/24/2014 0900   CREATININE 1.3* 01/28/2014 1354    CALCIUM 9.2 08/21/2014 0900   CALCIUM 9.2 07/24/2014 0900   CALCIUM 9.4 01/28/2014 1354   PROT 6.2 09/24/2014 0810   PROT 6.9 08/21/2014 0900   PROT 6.7 01/28/2014 1354   ALBUMIN 3.8 09/24/2014 0810   ALBUMIN 3.7 01/28/2014 1354   AST 17 09/24/2014 0810   AST 21 08/21/2014 0900   AST 10 01/28/2014 1354   ALT 11 09/24/2014 0810   ALT 19 08/21/2014 0900   ALT 13 01/28/2014 1354   ALKPHOS 48 09/24/2014 0810   ALKPHOS 41 08/21/2014 0900   ALKPHOS 63 01/28/2014 1354   BILITOT 0.7 09/24/2014 0810   BILITOT 0.90 08/21/2014 0900   BILITOT 0.76 01/28/2014 1354   GFRNONAA 65* 12/20/2013 0154   GFRAA 76* 12/20/2013 0154   No results found for: SPEP Lab Results  Component Value Date   WBC 4.2 08/21/2014   NEUTROABS 3.0 08/21/2014   HGB 11.7 08/21/2014   HCT 35.5 08/21/2014   MCV 90 08/21/2014   PLT 128* 08/21/2014   No results found for: LABCA2 No components found for: FIEPP295 No results for input(s): INR in the last 168 hours.  STUDIES: No results found.  ASSESSMENT/PLAN: Ms. Grosvenor is 79 year old white female with stage IIA ductal carcinoma of the left breast. She had 4 positive lymph nodes. Her breast cancer was triple positive. She completed her adjuvant chemotherapy in July 2015. She has completed radiation to the left breast. She is doing well with Herceptin. No complications. She is asymptomatic today. Today Hgb 10.9 MCV 92 platelets 128 WBC 3.7.  We will proceed with treatment today as planned.  She will continue to get Zometa every 6 months.  She will continue taking the Femara.  She has her appointment and treatment schedule.  She knows to call here with any questions or concerns and to go to the ED in the even of an emergency. We can certainly see her sooner  if need be.   Eliezer Bottom, NP 10/02/2014 9:32 AM

## 2014-10-23 ENCOUNTER — Encounter: Payer: Self-pay | Admitting: Family

## 2014-10-23 ENCOUNTER — Ambulatory Visit (HOSPITAL_BASED_OUTPATIENT_CLINIC_OR_DEPARTMENT_OTHER): Payer: Medicare Other | Admitting: Family

## 2014-10-23 ENCOUNTER — Ambulatory Visit (HOSPITAL_BASED_OUTPATIENT_CLINIC_OR_DEPARTMENT_OTHER): Payer: Medicare Other

## 2014-10-23 ENCOUNTER — Other Ambulatory Visit (HOSPITAL_BASED_OUTPATIENT_CLINIC_OR_DEPARTMENT_OTHER): Payer: Medicare Other | Admitting: Lab

## 2014-10-23 DIAGNOSIS — C50919 Malignant neoplasm of unspecified site of unspecified female breast: Secondary | ICD-10-CM

## 2014-10-23 DIAGNOSIS — C50412 Malignant neoplasm of upper-outer quadrant of left female breast: Secondary | ICD-10-CM

## 2014-10-23 DIAGNOSIS — C773 Secondary and unspecified malignant neoplasm of axilla and upper limb lymph nodes: Secondary | ICD-10-CM

## 2014-10-23 DIAGNOSIS — Z5112 Encounter for antineoplastic immunotherapy: Secondary | ICD-10-CM

## 2014-10-23 DIAGNOSIS — Z170 Estrogen receptor positive status [ER+]: Secondary | ICD-10-CM

## 2014-10-23 DIAGNOSIS — C50912 Malignant neoplasm of unspecified site of left female breast: Secondary | ICD-10-CM

## 2014-10-23 LAB — CBC WITH DIFFERENTIAL (CANCER CENTER ONLY)
BASO#: 0 10*3/uL (ref 0.0–0.2)
BASO%: 0.5 % (ref 0.0–2.0)
EOS%: 3 % (ref 0.0–7.0)
Eosinophils Absolute: 0.1 10*3/uL (ref 0.0–0.5)
HCT: 34.4 % — ABNORMAL LOW (ref 34.8–46.6)
HGB: 11.3 g/dL — ABNORMAL LOW (ref 11.6–15.9)
LYMPH#: 0.8 10*3/uL — ABNORMAL LOW (ref 0.9–3.3)
LYMPH%: 20.1 % (ref 14.0–48.0)
MCH: 30 pg (ref 26.0–34.0)
MCHC: 32.8 g/dL (ref 32.0–36.0)
MCV: 91 fL (ref 81–101)
MONO#: 0.5 10*3/uL (ref 0.1–0.9)
MONO%: 11.4 % (ref 0.0–13.0)
NEUT#: 2.6 10*3/uL (ref 1.5–6.5)
NEUT%: 65 % (ref 39.6–80.0)
Platelets: 130 10*3/uL — ABNORMAL LOW (ref 145–400)
RBC: 3.77 10*6/uL (ref 3.70–5.32)
RDW: 13.3 % (ref 11.1–15.7)
WBC: 4 10*3/uL (ref 3.9–10.0)

## 2014-10-23 LAB — CMP (CANCER CENTER ONLY)
ALT(SGPT): 15 U/L (ref 10–47)
AST: 24 U/L (ref 11–38)
Albumin: 3.8 g/dL (ref 3.3–5.5)
Alkaline Phosphatase: 49 U/L (ref 26–84)
BUN, Bld: 25 mg/dL — ABNORMAL HIGH (ref 7–22)
CO2: 28 mEq/L (ref 18–33)
Calcium: 9.8 mg/dL (ref 8.0–10.3)
Chloride: 101 mEq/L (ref 98–108)
Creat: 0.8 mg/dl (ref 0.6–1.2)
Glucose, Bld: 110 mg/dL (ref 73–118)
Potassium: 3.7 mEq/L (ref 3.3–4.7)
Sodium: 142 mEq/L (ref 128–145)
Total Bilirubin: 0.8 mg/dl (ref 0.20–1.60)
Total Protein: 7 g/dL (ref 6.4–8.1)

## 2014-10-23 MED ORDER — ZOLEDRONIC ACID 4 MG/100ML IV SOLN
4.0000 mg | Freq: Once | INTRAVENOUS | Status: AC
  Administered 2014-10-23: 4 mg via INTRAVENOUS
  Filled 2014-10-23: qty 100

## 2014-10-23 MED ORDER — ACETAMINOPHEN 325 MG PO TABS
ORAL_TABLET | ORAL | Status: AC
  Filled 2014-10-23: qty 2

## 2014-10-23 MED ORDER — DIPHENHYDRAMINE HCL 25 MG PO CAPS
ORAL_CAPSULE | ORAL | Status: AC
  Filled 2014-10-23: qty 2

## 2014-10-23 MED ORDER — SODIUM CHLORIDE 0.9 % IJ SOLN
3.0000 mL | Freq: Once | INTRAMUSCULAR | Status: DC | PRN
  Filled 2014-10-23: qty 10

## 2014-10-23 MED ORDER — HEPARIN SOD (PORK) LOCK FLUSH 100 UNIT/ML IV SOLN
500.0000 [IU] | Freq: Once | INTRAVENOUS | Status: AC | PRN
  Administered 2014-10-23: 500 [IU]
  Filled 2014-10-23: qty 5

## 2014-10-23 MED ORDER — SODIUM CHLORIDE 0.9 % IV SOLN
Freq: Once | INTRAVENOUS | Status: AC
  Administered 2014-10-23: 10:00:00 via INTRAVENOUS

## 2014-10-23 MED ORDER — SODIUM CHLORIDE 0.9 % IJ SOLN
10.0000 mL | INTRAMUSCULAR | Status: DC | PRN
  Administered 2014-10-23: 10 mL
  Filled 2014-10-23: qty 10

## 2014-10-23 MED ORDER — SODIUM CHLORIDE 0.9 % IJ SOLN
10.0000 mL | INTRAMUSCULAR | Status: DC | PRN
  Filled 2014-10-23: qty 10

## 2014-10-23 MED ORDER — HEPARIN SOD (PORK) LOCK FLUSH 100 UNIT/ML IV SOLN
250.0000 [IU] | Freq: Once | INTRAVENOUS | Status: DC | PRN
  Filled 2014-10-23: qty 5

## 2014-10-23 MED ORDER — TRASTUZUMAB CHEMO INJECTION 440 MG
6.0000 mg/kg | Freq: Once | INTRAVENOUS | Status: AC
  Administered 2014-10-23: 462 mg via INTRAVENOUS
  Filled 2014-10-23: qty 22

## 2014-10-23 MED ORDER — DIPHENHYDRAMINE HCL 25 MG PO CAPS
50.0000 mg | ORAL_CAPSULE | Freq: Once | ORAL | Status: AC
  Administered 2014-10-23: 50 mg via ORAL

## 2014-10-23 MED ORDER — ACETAMINOPHEN 325 MG PO TABS
650.0000 mg | ORAL_TABLET | Freq: Once | ORAL | Status: AC
  Administered 2014-10-23: 650 mg via ORAL

## 2014-10-23 NOTE — Patient Instructions (Signed)
Trastuzumab injection for infusion What is this medicine? TRASTUZUMAB (tras TOO zoo mab) is a monoclonal antibody. It targets a protein called HER2. This protein is found in some stomach and breast cancers. This medicine can stop cancer cell growth. This medicine may be used with other cancer treatments. This medicine may be used for other purposes; ask your health care provider or pharmacist if you have questions. COMMON BRAND NAME(S): Herceptin What should I tell my health care provider before I take this medicine? They need to know if you have any of these conditions: -heart disease -heart failure -infection (especially a virus infection such as chickenpox, cold sores, or herpes) -lung or breathing disease, like asthma -recent or ongoing radiation therapy -an unusual or allergic reaction to trastuzumab, benzyl alcohol, or other medications, foods, dyes, or preservatives -pregnant or trying to get pregnant -breast-feeding How should I use this medicine? This drug is given as an infusion into a vein. It is administered in a hospital or clinic by a specially trained health care professional. Talk to your pediatrician regarding the use of this medicine in children. This medicine is not approved for use in children. Overdosage: If you think you have taken too much of this medicine contact a poison control center or emergency room at once. NOTE: This medicine is only for you. Do not share this medicine with others. What if I miss a dose? It is important not to miss a dose. Call your doctor or health care professional if you are unable to keep an appointment. What may interact with this medicine? -cyclophosphamide -doxorubicin -warfarin This list may not describe all possible interactions. Give your health care provider a list of all the medicines, herbs, non-prescription drugs, or dietary supplements you use. Also tell them if you smoke, drink alcohol, or use illegal drugs. Some items may  interact with your medicine. What should I watch for while using this medicine? Visit your doctor for checks on your progress. Report any side effects. Continue your course of treatment even though you feel ill unless your doctor tells you to stop. Call your doctor or health care professional for advice if you get a fever, chills or sore throat, or other symptoms of a cold or flu. Do not treat yourself. Try to avoid being around people who are sick. You may experience fever, chills and shaking during your first infusion. These effects are usually mild and can be treated with other medicines. Report any side effects during the infusion to your health care professional. Fever and chills usually do not happen with later infusions. What side effects may I notice from receiving this medicine? Side effects that you should report to your doctor or other health care professional as soon as possible: -breathing difficulties -chest pain or palpitations -cough -dizziness or fainting -fever or chills, sore throat -skin rash, itching or hives -swelling of the legs or ankles -unusually weak or tired Side effects that usually do not require medical attention (report to your doctor or other health care professional if they continue or are bothersome): -loss of appetite -headache -muscle aches -nausea This list may not describe all possible side effects. Call your doctor for medical advice about side effects. You may report side effects to FDA at 1-800-FDA-1088. Where should I keep my medicine? This drug is given in a hospital or clinic and will not be stored at home. NOTE: This sheet is a summary. It may not cover all possible information. If you have questions about this medicine, talk   to your doctor, pharmacist, or health care provider.  2015, Elsevier/Gold Standard. (2009-07-25 13:43:15) Zoledronic Acid injection (Hypercalcemia, Oncology) What is this medicine? ZOLEDRONIC ACID (ZOE le dron ik AS id)  lowers the amount of calcium loss from bone. It is used to treat too much calcium in your blood from cancer. It is also used to prevent complications of cancer that has spread to the bone. This medicine may be used for other purposes; ask your health care provider or pharmacist if you have questions. COMMON BRAND NAME(S): Zometa What should I tell my health care provider before I take this medicine? They need to know if you have any of these conditions: -aspirin-sensitive asthma -cancer, especially if you are receiving medicines used to treat cancer -dental disease or wear dentures -infection -kidney disease -receiving corticosteroids like dexamethasone or prednisone -an unusual or allergic reaction to zoledronic acid, other medicines, foods, dyes, or preservatives -pregnant or trying to get pregnant -breast-feeding How should I use this medicine? This medicine is for infusion into a vein. It is given by a health care professional in a hospital or clinic setting. Talk to your pediatrician regarding the use of this medicine in children. Special care may be needed. Overdosage: If you think you have taken too much of this medicine contact a poison control center or emergency room at once. NOTE: This medicine is only for you. Do not share this medicine with others. What if I miss a dose? It is important not to miss your dose. Call your doctor or health care professional if you are unable to keep an appointment. What may interact with this medicine? -certain antibiotics given by injection -NSAIDs, medicines for pain and inflammation, like ibuprofen or naproxen -some diuretics like bumetanide, furosemide -teriparatide -thalidomide This list may not describe all possible interactions. Give your health care provider a list of all the medicines, herbs, non-prescription drugs, or dietary supplements you use. Also tell them if you smoke, drink alcohol, or use illegal drugs. Some items may interact  with your medicine. What should I watch for while using this medicine? Visit your doctor or health care professional for regular checkups. It may be some time before you see the benefit from this medicine. Do not stop taking your medicine unless your doctor tells you to. Your doctor may order blood tests or other tests to see how you are doing. Women should inform their doctor if they wish to become pregnant or think they might be pregnant. There is a potential for serious side effects to an unborn child. Talk to your health care professional or pharmacist for more information. You should make sure that you get enough calcium and vitamin D while you are taking this medicine. Discuss the foods you eat and the vitamins you take with your health care professional. Some people who take this medicine have severe bone, joint, and/or muscle pain. This medicine may also increase your risk for jaw problems or a broken thigh bone. Tell your doctor right away if you have severe pain in your jaw, bones, joints, or muscles. Tell your doctor if you have any pain that does not go away or that gets worse. Tell your dentist and dental surgeon that you are taking this medicine. You should not have major dental surgery while on this medicine. See your dentist to have a dental exam and fix any dental problems before starting this medicine. Take good care of your teeth while on this medicine. Make sure you see your dentist for regular   follow-up appointments. What side effects may I notice from receiving this medicine? Side effects that you should report to your doctor or health care professional as soon as possible: -allergic reactions like skin rash, itching or hives, swelling of the face, lips, or tongue -anxiety, confusion, or depression -breathing problems -changes in vision -eye pain -feeling faint or lightheaded, falls -jaw pain, especially after dental work -mouth sores -muscle cramps, stiffness, or  weakness -trouble passing urine or change in the amount of urine Side effects that usually do not require medical attention (report to your doctor or health care professional if they continue or are bothersome): -bone, joint, or muscle pain -constipation -diarrhea -fever -hair loss -irritation at site where injected -loss of appetite -nausea, vomiting -stomach upset -trouble sleeping -trouble swallowing -weak or tired This list may not describe all possible side effects. Call your doctor for medical advice about side effects. You may report side effects to FDA at 1-800-FDA-1088. Where should I keep my medicine? This drug is given in a hospital or clinic and will not be stored at home. NOTE: This sheet is a summary. It may not cover all possible information. If you have questions about this medicine, talk to your doctor, pharmacist, or health care provider.  2015, Elsevier/Gold Standard. (2013-03-01 13:03:13)  

## 2014-10-23 NOTE — Progress Notes (Signed)
[facility]  [phone] Fax:([phone]) [phone]  ID: HUBERTA TOMPKINS OB: [birth year] MR#: [id number] MWU#:[id number] Patient Care Team: Laurey Morale, MD as PCP - General  DIAGNOSIS: Stage IIA (T1N1M0) adenocarcinoma of the left breast-triple positive  INTERVAL HISTORY: Ms. Kim [name] is here today for follow-up and treatment. She is doing well and has had no complications.  She denies fever, chills, n/v, cough, rash, headache, dizziness, SOB, chest pain, palpitations, abdominal pain, constipation, diarrhea, blood in urine or stool. No bleeding. She has had no swelling or tenderness in her extremities. Her neuropathy in her feet is the same and she states that it is tolerable.  Her appetite is good and she is drinking plenty of fluids. Her weight is stable at 173 lbs. In November, her echo showed an ejection fraction of 59%.  CURRENT TREATMENT: Status post cycle 4 of Abraxane/Herceptin  Maintenance Herceptin q 3wk  Radiation therapy to the left breast completed Zometa 4 mg IV every 6 months  Femara 2.5 mg by mouth daily (started 07/01/14)  REVIEW OF SYSTEMS: All other 10 point review of systems is negative.   PAST MEDICAL HISTORY: Past Medical History  Diagnosis Date  . Allergy   . Allergic rhinitis   . Hypothyroidism   . Urinary tract infection   . Diverticulosis   . Hx of adenomatous colonic polyps 02/1999  . Tubulovillous adenoma of colon 12/2009    with HGD  . Low back pain     gets ESI from Dr. Rennis Harding  . Arthritis   . Hypertension   . HOH (hard of hearing)   . Wears hearing aid   . Breast cancer of upper-outer quadrant of left female breast 11/08/13  . CAD (coronary artery disease)     a. 12/2013 Cath/PCI: LM nl, LAD 20p, 66m, 30d, D1 30p, LCX 20p, 3m, OM1 20, Mo2 40p, RCA 30p, 35m (4.0x16 Promus Premier DES), 20d, RPL 30, RPDA 70p, 72m, EF 65-70%.  . Hyperlipidemia   . S/P radiation therapy 05/14/2014-06/26/2014    1) Left Breast  / 50 Gy in 25  fractions, 2) Left Supraclavicular fossa/ 47.5 Gy in 25 fractions, 3) Left Posterior Axillary boost / 4.825 Gy in 25 fractions, 4) Left Breast boost / 10 Gy in 5 fractions     PAST SURGICAL HISTORY: Past Surgical History  Procedure Laterality Date  . Abdominal hysterectomy    . Bilateral salpingoophorectomy      see Laurin Coder NP for GYN exams  . Cataract extraction w/ intraocular lens  implant, bilateral  2012    bilateral  . Colonoscopy  01-14-12    per Dr. Fuller Plan, adenomatous polyps, repeat in 3 years   . Breast lumpectomy with needle localization and axillary lymph node dissection Left 11/05/2013    Procedure: LEFT BREAST LUMPECTOMY WITH NEEDLE LOCALIZATION and axillary lymph Node Dissection;  Surgeon: Edward Jolly, MD;  Location: [facility];  Service: General;  Laterality: Left;  . Breast surgery    . Portacath placement Right 12/11/2013    Procedure: INSERTION PORT-A-CATH;  Surgeon: Edward Jolly, MD;  Location: [facility];  Service: General;  Laterality: Right;  Subclavian Vein;   . Left heart catheterization with coronary angiogram N/A 12/19/2013    Procedure: LEFT HEART CATHETERIZATION WITH CORONARY ANGIOGRAM;  Surgeon: Burnell Blanks, MD;  Location: [facility] CATH LAB;  Service: Cardiovascular;  Laterality: N/A;   FAMILY HISTORY Family History  Problem Relation Age of Onset  . Alcohol  abuse Father   . Kidney failure Mother   . Colon cancer      family   GYNECOLOGIC HISTORY:  Patient's last menstrual period was 10/19/1991.   SOCIAL HISTORY:  History   Social History  . Marital Status: Married    Spouse Name: N/A    Number of Children: 2  . Years of Education: N/A   Occupational History  . Retired-school system    Social History Main Topics  . Smoking status: Never Smoker   . Smokeless tobacco: Never Used     Comment: never used tobacco  . Alcohol Use: No  . Drug Use: No  . Sexual Activity: Not Currently   Other Topics  Concern  . Not on file   Social History Narrative   ADVANCED DIRECTIVES: <no information>  HEALTH MAINTENANCE: History  Substance Use Topics  . Smoking status: Never Smoker   . Smokeless tobacco: Never Used     Comment: never used tobacco  . Alcohol Use: No   Colonoscopy: PAP: Bone density: Lipid panel:  Allergies  Allergen Reactions  . Propoxyphene N-Acetaminophen Swelling    tongue swelling  . Penicillins Swelling    Facial swelling  . Cortisone Rash  . Myrbetriq [Mirabegron]     Rash on face  . Pseudoephedrine Hcl Er Rash and Other (See Comments)    Face gets red   Current Outpatient Prescriptions  Medication Sig Dispense Refill  . acetaminophen (TYLENOL) 650 MG CR tablet Take 650 mg by mouth every 8 (eight) hours as needed.    Marland Kitchen acidophilus (RISAQUAD) CAPS capsule Take 1 capsule by mouth daily.    . Ascorbic Acid (VITAMIN C) 1000 MG tablet Take 1,000 mg by mouth daily.    Marland Kitchen aspirin EC 81 MG EC tablet Take 1 tablet (81 mg total) by mouth daily.    . Calcium Citrate-Vitamin D (CITRACAL MAXIMUM PO) Take 1 tablet by mouth daily.     . cetirizine (ZYRTEC) 10 MG tablet Take 10 mg by mouth daily as needed for allergies.    . Cyanocobalamin (VITAMIN B-12) 2500 MCG SUBL Place under the tongue as needed.    . furosemide (LASIX) 20 MG tablet Take 1 tablet (20 mg total) by mouth daily. Take 1 tablet (20 mg total) every day x 3 days then take once daily as needed 30 tablet 0  . letrozole (FEMARA) 2.5 MG tablet Take 2.5 mg by mouth daily.     Marland Kitchen levothyroxine (SYNTHROID, LEVOTHROID) 125 MCG tablet Take 1 tablet (125 mcg total) by mouth daily before breakfast. 90 tablet 3  . lidocaine-prilocaine (EMLA) cream Apply topically as needed. 30 g 6  . Loperamide HCl (IMODIUM PO) Take by mouth as needed.    . metoprolol succinate (TOPROL XL) 50 MG 24 hr tablet Take 1 tablet (50 mg total) by mouth 2 (two) times daily. Take with or immediately following a meal. 60 tablet 6  . Multiple  Vitamins-Minerals (CENTRUM ADULTS PO) Take by mouth.    . nitroGLYCERIN (NITROSTAT) 0.4 MG SL tablet Place 1 tablet (0.4 mg total) under the tongue every 5 (five) minutes x 3 doses as needed for chest pain. 25 tablet 3  . rosuvastatin (CRESTOR) 10 MG tablet Take 1 tablet (10 mg total) by mouth daily. 30 tablet 3  . [DISCONTINUED] ferrous sulfate 325 (65 FE) MG tablet Take 325 mg by mouth 2 (two) times daily.       No current facility-administered medications for this visit.   Facility-Administered Medications  Ordered in Other Visits  Medication Dose Route Frequency Provider Last Rate Last Dose  . heparin lock flush 100 unit/mL  500 Units Intracatheter Once PRN [name], MD      . sodium chloride 0.9 % injection 10 mL  10 mL Intracatheter PRN [name], MD      . trastuzumab (HERCEPTIN) 462 mg in sodium chloride 0.9 % 250 mL chemo infusion  6 mg/kg (Treatment Plan Actual) Intravenous Once [name], MD       OBJECTIVE: Filed Vitals:   10/23/14 0859  BP: 127/56  Pulse: 79  Temp: 97.6 F (36.4 C)  Resp: 14   Body mass index is 26.31 kg/(m^2). ECOG FS:0 - Asymptomatic Ocular: Sclerae unicteric, pupils equal, round and reactive to light Ear-nose-throat: Oropharynx clear, dentition fair Lymphatic: No cervical or supraclavicular adenopathy Lungs no rales or rhonchi, good excursion bilaterally Heart regular rate and rhythm, no murmur appreciated Abd soft, nontender, positive bowel sounds MSK no focal spinal tenderness, no joint edema Neuro: non-focal, well-oriented, appropriate affect Breasts: No changes, no mass, lesion, or rash. No lymphedema.   LAB RESULTS: CMP     Component Value Date/Time   NA 142 10/23/2014 0852   NA 140 07/24/2014 0900   NA 137 01/28/2014 1354   K 3.7 10/23/2014 0852   K 4.2 07/24/2014 0900   K 4.3 01/28/2014 1354   CL 101 10/23/2014 0852   CL 105 07/24/2014 0900   CO2 28 10/23/2014 0852   CO2 23 07/24/2014 0900   CO2 21* 01/28/2014  1354   GLUCOSE 110 10/23/2014 0852   GLUCOSE 85 07/24/2014 0900   GLUCOSE 156* 01/28/2014 1354   BUN 25* 10/23/2014 0852   BUN 17 07/24/2014 0900   BUN 35.3* 01/28/2014 1354   CREATININE 0.8 10/23/2014 0852   CREATININE 1.0 07/24/2014 0900   CREATININE 1.3* 01/28/2014 1354   CALCIUM 9.8 10/23/2014 0852   CALCIUM 9.2 07/24/2014 0900   CALCIUM 9.4 01/28/2014 1354   PROT 7.0 10/23/2014 0852   PROT 6.2 09/24/2014 0810   PROT 6.7 01/28/2014 1354   ALBUMIN 3.8 09/24/2014 0810   ALBUMIN 3.7 01/28/2014 1354   AST 24 10/23/2014 0852   AST 17 09/24/2014 0810   AST 10 01/28/2014 1354   ALT 15 10/23/2014 0852   ALT 11 09/24/2014 0810   ALT 13 01/28/2014 1354   ALKPHOS 49 10/23/2014 0852   ALKPHOS 48 09/24/2014 0810   ALKPHOS 63 01/28/2014 1354   BILITOT 0.80 10/23/2014 0852   BILITOT 0.7 09/24/2014 0810   BILITOT 0.76 01/28/2014 1354   GFRNONAA 65* 12/20/2013 0154   GFRAA 76* 12/20/2013 0154   No results found for: SPEP Lab Results  Component Value Date   WBC 4.0 10/23/2014   NEUTROABS 2.6 10/23/2014   HGB 11.3* 10/23/2014   HCT 34.4* 10/23/2014   MCV 91 10/23/2014   PLT 130* 10/23/2014   No results found for: LABCA2 No components found for: [id number] No results for input(s): INR in the last 168 hours.  STUDIES: No results found.  ASSESSMENT/PLAN: Ms. Hotard is 80 year old white female with stage IIA ductal carcinoma of the left breast. She had 4 positive lymph nodes. Her breast cancer was triple positive. She completed her adjuvant chemotherapy in July 2015. She has completed radiation to the left breast. She is doing well with Herceptin. No complications. She is asymptomatic today. Today Hgb 11.3 MCV 91 platelets 130 WBC 4.0.  We will proceed with cycle 12 of treatment  today as planned.  She will continue to get Zometa every 6 months.  She will continue taking the Femara.  She has her appointment and treatment schedule.  She knows to call here with any questions or  concerns and to go to the ED in the even of an emergency. We can certainly see her sooner if need be.   Eliezer Bottom, NP 10/23/2014 10:01 AM

## 2014-10-28 ENCOUNTER — Ambulatory Visit (INDEPENDENT_AMBULATORY_CARE_PROVIDER_SITE_OTHER): Payer: Medicare Other | Admitting: Cardiology

## 2014-10-28 ENCOUNTER — Other Ambulatory Visit (HOSPITAL_COMMUNITY): Payer: Medicare Other

## 2014-10-28 ENCOUNTER — Encounter: Payer: Self-pay | Admitting: Cardiology

## 2014-10-28 ENCOUNTER — Encounter: Payer: Self-pay | Admitting: *Deleted

## 2014-10-28 VITALS — BP 126/64 | HR 70 | Ht 68.0 in | Wt 173.0 lb

## 2014-10-28 DIAGNOSIS — I421 Obstructive hypertrophic cardiomyopathy: Secondary | ICD-10-CM

## 2014-10-28 DIAGNOSIS — E785 Hyperlipidemia, unspecified: Secondary | ICD-10-CM

## 2014-10-28 DIAGNOSIS — I2510 Atherosclerotic heart disease of native coronary artery without angina pectoris: Secondary | ICD-10-CM

## 2014-10-28 MED ORDER — CLOPIDOGREL BISULFATE 75 MG PO TABS
75.0000 mg | ORAL_TABLET | Freq: Every day | ORAL | Status: DC
Start: 2014-10-28 — End: 2015-01-30

## 2014-10-28 NOTE — Patient Instructions (Signed)
Take Plavix 75mg  daily.  Your physician has requested that you have an echocardiogram. Echocardiography is a painless test that uses sound waves to create images of your heart. It provides your doctor with information about the size and shape of your heart and how well your heart's chambers and valves are working. This procedure takes approximately one hour. There are no restrictions for this procedure. FIRST Of MARCH 2016  Your physician wants you to follow-up in: 6 months with Dr Aundra Dubin. (July 2016).  You will receive a reminder letter in the mail two months in advance. If you don't receive a letter, please call our office to schedule the follow-up appointment.

## 2014-10-28 NOTE — Progress Notes (Signed)
Patient ID: Kim [name], female   DOB: [birth year], 80 y.o.   MRN: [id number] PCP: Dr. Sarajane Jews Oncologist: Dr. Marin Olp  80 yo with history of CAD and HOCM presents for cardiology evaluation given use of Herceptin with her breast cancer chemo.  Patient is s/p lumpectomy with lymph node biopsy in 2/15.  4/10 nodes positive.  She was started on docetaxol/carboplatin/Herceptin in 3/15 with plan for 6 cycles chemo. She had a tough time with her 1st chemotherapy session and felt very weak afterwards.  Shortly after, she developed unstable angina and ended up getting a DES to the mid RCA in 3/15.  Patient additionally has a history of HOCM.  This has been recognized on prior echoes.  She has severe asymmetric basal septal hypertrophy and SAM with LVOT gradient peak 58 mmHg on echo in 3/15 along with moderate MR.  Repeat echo in 7/15 showed LVOT gradient down to 30 mmHg on higher beta blocker.  Repeat echo in 11/15 showed no significant LVOT gradient but SAM still present.   She returns for followup today.  She has completed adjuvant chemotherapy and radiation and will be getting Herceptin until 5/16.   Overall, she is doing well.  No chest pain.  Stable dyspnea after walking 1-2 blocks.  No lightheadedness or syncope.  No orthopnea/PND.  Plavix is no longer on her medication list and she is not sure that she is still taking it.   Labs (3/15): K 4.5, creatinine 0.84, LDL 91, HDL 46 Labs (5/15): K 3.9, creatinine 1.1 Labs (12/15): LDL 80, HDL 28, hemoglobin 10.9 Labs (1/16): K 3.7, creatinine 0.8  PMH: 1. CAD: Unstable angina 3/15 with LHC showing 99% mRCA stenosis, treated with DES to mRCA.  2. Hypothyroidism 3. Diverticulosis 4. HTN 5. H/o TAH/BSO 6. Breast cancer: s/p lumpectomy with lymph node biopsy in 2/15.  4/10 nodes positive.  She was started on docetaxol/carboplatin/Herceptin in 3/15 with plan for 6 cycles chemo.  7. Hypertrophic obstructive cardiomyopathy: Echo (3/15) with severe focal basal  septal hypertrophy (22 mm), narrow LV outflow tract with mitral valve SAM and 58 mmHg peak LVOT resting gradient, EF 60-65%, moderate MR, moderate LAE, normal RV, lateral s' 10.4 cm/sec.  Patient says that her 2 grown sons has had echoes to screen for HOCM.  Echo (4/15) with EF 65-70%, severe focal basal septal hypertrophy, LVOT gradient 33 mmHg, SAM was present with moderate MR, normal RV size and systolic function, lateral S' 10.6, GLS -17.5%.  Cardiac MRI (5/15) with EF 65%, moderate asymmetric septal hypertrophy, systolic anterior motion of the mitral valve with moderate MR, there was no delayed enhancement.  Echo (7/15) with EF 65-70%, moderate ASH, peak LVOT gradient 30 mmHg, SAM with moderate MR, moderate to severe LAE.  Echo (11/15) with EF 55-60%, GLS -15%, no significant LVOT gradient, systolic anterior motion of the mitral valve with mild MR, normal RV size and systolic function.  8. Hyperlipidemia: Myalgias with atorvastatin  SH: Married, 2 children, retired, nonsmoker.   FH: No family history of HOCM or sudden death.  There is a family history of CAD.   ROS: All systems reviewed and negative except as per HPI.    Current Outpatient Prescriptions  Medication Sig Dispense Refill  . acetaminophen (TYLENOL) 650 MG CR tablet Take 650 mg by mouth every 8 (eight) hours as needed.    Marland Kitchen acidophilus (RISAQUAD) CAPS capsule Take 1 capsule by mouth daily.    . Ascorbic Acid (VITAMIN C) 1000 MG tablet Take 1,000  mg by mouth daily.    Marland Kitchen aspirin EC 81 MG EC tablet Take 1 tablet (81 mg total) by mouth daily.    . Calcium Citrate-Vitamin D (CITRACAL MAXIMUM PO) Take 1 tablet by mouth daily.     . cetirizine (ZYRTEC) 10 MG tablet Take 10 mg by mouth daily as needed for allergies.    . Cyanocobalamin (VITAMIN B-12) 2500 MCG SUBL Place under the tongue as needed.    . furosemide (LASIX) 20 MG tablet Take 1 tablet (20 mg total) by mouth daily. Take 1 tablet (20 mg total) every day x 3 days then take once  daily as needed 30 tablet 0  . letrozole (FEMARA) 2.5 MG tablet Take 2.5 mg by mouth daily.     Marland Kitchen levothyroxine (SYNTHROID, LEVOTHROID) 125 MCG tablet Take 1 tablet (125 mcg total) by mouth daily before breakfast. 90 tablet 3  . lidocaine-prilocaine (EMLA) cream Apply topically as needed. 30 g 6  . Loperamide HCl (IMODIUM PO) Take by mouth as needed.    . metoprolol succinate (TOPROL XL) 50 MG 24 hr tablet Take 1 tablet (50 mg total) by mouth 2 (two) times daily. Take with or immediately following a meal. 60 tablet 6  . Multiple Vitamins-Minerals (CENTRUM ADULTS PO) Take by mouth.    . nitroGLYCERIN (NITROSTAT) 0.4 MG SL tablet Place 1 tablet (0.4 mg total) under the tongue every 5 (five) minutes x 3 doses as needed for chest pain. 25 tablet 3  . rosuvastatin (CRESTOR) 10 MG tablet Take 1 tablet (10 mg total) by mouth daily. 30 tablet 3  . clopidogrel (PLAVIX) 75 MG tablet Take 1 tablet (75 mg total) by mouth daily. 90 tablet 2  . [DISCONTINUED] ferrous sulfate 325 (65 FE) MG tablet Take 325 mg by mouth 2 (two) times daily.       No current facility-administered medications for this visit.   BP 126/64 mmHg  Pulse 70  Ht 5\' 8"  (1.727 m)  Wt 173 lb (78.472 kg)  BMI 26.31 kg/m2  SpO2 95%  LMP 10/19/1991 General: NAD Neck: No JVD, no thyromegaly or thyroid nodule.  Lungs: Clear to auscultation bilaterally with normal respiratory effort. CV: Nondisplaced PMI.  Heart regular S1/S2, no S3/S4, 2/6 SEM RUSB.  No peripheral edema.  No carotid bruit.  Normal pedal pulses.  Abdomen: Soft, nontender, no hepatosplenomegaly, no distention.  Skin: Intact without lesions or rashes.  Neurologic: Alert and oriented x 3.  Psych: Normal affect. Extremities: No clubbing or cyanosis.   Assessment/Plan: 1. CAD: Status post PCI for unstable angina in 3/15 with DES to [facility].  - Continue ASA 81, statin, and Toprol XL.   - She needs to be on Plavix until at least 3/16.  She is not sure she has been taking it  recently and it is no longer on her medication list.  I will have her restart Plavix 75 mg daily today.  2. Hyperlipidemia: She is tolerating Crestor and lipids were ok in 12/15.  3. Hypertrophic obstructive cardiomyopathy: No family history of HOCM or sudden death (interestingly, her husband has HOCM).  Most recent echo did not show significant LVOT gradient but SAM was still present with mild MR.  She tolerated increase in Toprol XL without problems.  Cardiac MRI in 5/15 showed no delayed enhancement.  - Continue current Toprol XL.  - Per patient, her 2 sons (in their 50s) have had screening echoes to look for hypertrophic cardiomyopathy and did not show signs of HOCM.  4. Breast cancer/Herceptin: Continue screening for cardiomyopathy related to Herceptin.  Repeat echo in 3/15.    [name] 10/28/2014

## 2014-11-09 ENCOUNTER — Other Ambulatory Visit: Payer: Self-pay | Admitting: Cardiology

## 2014-11-13 ENCOUNTER — Other Ambulatory Visit (HOSPITAL_BASED_OUTPATIENT_CLINIC_OR_DEPARTMENT_OTHER): Payer: Medicare Other | Admitting: Lab

## 2014-11-13 ENCOUNTER — Ambulatory Visit (HOSPITAL_BASED_OUTPATIENT_CLINIC_OR_DEPARTMENT_OTHER): Payer: Medicare Other

## 2014-11-13 ENCOUNTER — Encounter: Payer: Self-pay | Admitting: Hematology & Oncology

## 2014-11-13 ENCOUNTER — Ambulatory Visit (HOSPITAL_BASED_OUTPATIENT_CLINIC_OR_DEPARTMENT_OTHER): Payer: Medicare Other | Admitting: Hematology & Oncology

## 2014-11-13 VITALS — BP 107/72 | HR 110 | Temp 98.2°F | Resp 16 | Ht 67.0 in | Wt 169.0 lb

## 2014-11-13 DIAGNOSIS — C50919 Malignant neoplasm of unspecified site of unspecified female breast: Secondary | ICD-10-CM

## 2014-11-13 DIAGNOSIS — C50412 Malignant neoplasm of upper-outer quadrant of left female breast: Secondary | ICD-10-CM

## 2014-11-13 DIAGNOSIS — C773 Secondary and unspecified malignant neoplasm of axilla and upper limb lymph nodes: Secondary | ICD-10-CM

## 2014-11-13 DIAGNOSIS — Z5112 Encounter for antineoplastic immunotherapy: Secondary | ICD-10-CM

## 2014-11-13 DIAGNOSIS — C50912 Malignant neoplasm of unspecified site of left female breast: Secondary | ICD-10-CM

## 2014-11-13 DIAGNOSIS — Z170 Estrogen receptor positive status [ER+]: Secondary | ICD-10-CM

## 2014-11-13 LAB — CBC WITH DIFFERENTIAL (CANCER CENTER ONLY)
BASO#: 0 10*3/uL (ref 0.0–0.2)
BASO%: 0.5 % (ref 0.0–2.0)
EOS%: 4.7 % (ref 0.0–7.0)
Eosinophils Absolute: 0.2 10*3/uL (ref 0.0–0.5)
HCT: 33.8 % — ABNORMAL LOW (ref 34.8–46.6)
HGB: 11 g/dL — ABNORMAL LOW (ref 11.6–15.9)
LYMPH#: 0.4 10*3/uL — ABNORMAL LOW (ref 0.9–3.3)
LYMPH%: 10.7 % — ABNORMAL LOW (ref 14.0–48.0)
MCH: 29.5 pg (ref 26.0–34.0)
MCHC: 32.5 g/dL (ref 32.0–36.0)
MCV: 91 fL (ref 81–101)
MONO#: 0.4 10*3/uL (ref 0.1–0.9)
MONO%: 9.9 % (ref 0.0–13.0)
NEUT#: 2.7 10*3/uL (ref 1.5–6.5)
NEUT%: 74.2 % (ref 39.6–80.0)
Platelets: 101 10*3/uL — ABNORMAL LOW (ref 145–400)
RBC: 3.73 10*6/uL (ref 3.70–5.32)
RDW: 13.6 % (ref 11.1–15.7)
WBC: 3.6 10*3/uL — ABNORMAL LOW (ref 3.9–10.0)

## 2014-11-13 LAB — CMP (CANCER CENTER ONLY)
ALT(SGPT): 10 U/L (ref 10–47)
AST: 22 U/L (ref 11–38)
Albumin: 4 g/dL (ref 3.3–5.5)
Alkaline Phosphatase: 57 U/L (ref 26–84)
BUN, Bld: 21 mg/dL (ref 7–22)
CO2: 25 mEq/L (ref 18–33)
Calcium: 9.3 mg/dL (ref 8.0–10.3)
Chloride: 102 mEq/L (ref 98–108)
Creat: 1.2 mg/dl (ref 0.6–1.2)
Glucose, Bld: 144 mg/dL — ABNORMAL HIGH (ref 73–118)
Potassium: 3.6 mEq/L (ref 3.3–4.7)
Sodium: 144 mEq/L (ref 128–145)
Total Bilirubin: 0.8 mg/dl (ref 0.20–1.60)
Total Protein: 6.8 g/dL (ref 6.4–8.1)

## 2014-11-13 MED ORDER — DIPHENHYDRAMINE HCL 25 MG PO CAPS
ORAL_CAPSULE | ORAL | Status: AC
  Filled 2014-11-13: qty 2

## 2014-11-13 MED ORDER — HEPARIN SOD (PORK) LOCK FLUSH 100 UNIT/ML IV SOLN
500.0000 [IU] | Freq: Once | INTRAVENOUS | Status: AC | PRN
  Administered 2014-11-13: 500 [IU]
  Filled 2014-11-13: qty 5

## 2014-11-13 MED ORDER — SODIUM CHLORIDE 0.9 % IV SOLN
Freq: Once | INTRAVENOUS | Status: AC
  Administered 2014-11-13: 09:00:00 via INTRAVENOUS

## 2014-11-13 MED ORDER — SODIUM CHLORIDE 0.9 % IJ SOLN
10.0000 mL | INTRAMUSCULAR | Status: DC | PRN
  Administered 2014-11-13: 10 mL
  Filled 2014-11-13: qty 10

## 2014-11-13 MED ORDER — ACETAMINOPHEN 325 MG PO TABS
650.0000 mg | ORAL_TABLET | Freq: Once | ORAL | Status: AC
  Administered 2014-11-13: 650 mg via ORAL

## 2014-11-13 MED ORDER — ACETAMINOPHEN 325 MG PO TABS
ORAL_TABLET | ORAL | Status: AC
  Filled 2014-11-13: qty 2

## 2014-11-13 MED ORDER — TRASTUZUMAB CHEMO INJECTION 440 MG
6.0000 mg/kg | Freq: Once | INTRAVENOUS | Status: AC
  Administered 2014-11-13: 462 mg via INTRAVENOUS
  Filled 2014-11-13: qty 22

## 2014-11-13 MED ORDER — DIPHENHYDRAMINE HCL 25 MG PO CAPS
50.0000 mg | ORAL_CAPSULE | Freq: Once | ORAL | Status: AC
  Administered 2014-11-13: 50 mg via ORAL

## 2014-11-13 NOTE — Progress Notes (Signed)
Hematology and Oncology Follow Up Visit  Kim [name] [id number] July 11, 1935 80 y.o. 11/13/2014   Principle Diagnosis:  Stage IIA (T1N1M0) adenocarcinoma of the left breast-triple positive  Current Therapy:   Status post cycle 4 of Abraxane/Herceptin Maintenance Herceptin q 3wk Zometa 4 mg IV every 6 months Femara 2.5 mg by mouth daily      Interim History:  Ms.  Kim [name] is back for followup. She is doing pretty well. She apparently is going to need surgery for her bladder in April. She has a "dropped bladder". This is causing some pressure on her intestines. She has a lot of incontinence.  From my point of view, I don't see any problems with her having surgery. Her blood counts should be okay. She's not on any chemotherapy.  She's tolerating the Herceptin well.  She recently had a foot infection. She is on doxycycline for this for what she says.  She currently is on Plavix. She does have some bruising. She's had no bleeding.  There's been no nausea vomiting. She's had a good appetite. She's had no cough. She's had no rashes.  She has a performance status of ECOG 1.    Medications:  Current outpatient prescriptions:  .  acetaminophen (TYLENOL) 650 MG CR tablet, Take 650 mg by mouth every 8 (eight) hours as needed., Disp: , Rfl:  .  acidophilus (RISAQUAD) CAPS capsule, Take 1 capsule by mouth daily., Disp: , Rfl:  .  Ascorbic Acid (VITAMIN C) 1000 MG tablet, Take 1,000 mg by mouth daily., Disp: , Rfl:  .  aspirin EC 81 MG EC tablet, Take 1 tablet (81 mg total) by mouth daily., Disp: , Rfl:  .  Calcium Citrate-Vitamin D (CITRACAL MAXIMUM PO), Take 1 tablet by mouth daily. , Disp: , Rfl:  .  cetirizine (ZYRTEC) 10 MG tablet, Take 10 mg by mouth daily as needed for allergies., Disp: , Rfl:  .  clopidogrel (PLAVIX) 75 MG tablet, Take 1 tablet (75 mg total) by mouth daily., Disp: 90 tablet, Rfl: 2 .  CRESTOR 10 MG tablet, TAKE 1 TABLET BY MOUTH ONCE A DAY, Disp: 30 tablet, Rfl: 4 .   Cyanocobalamin (VITAMIN B-12) 2500 MCG SUBL, Place under the tongue as needed., Disp: , Rfl:  .  doxycycline (VIBRAMYCIN) 100 MG capsule, Take 100 mg by mouth 2 (two) times daily., Disp: , Rfl:  .  furosemide (LASIX) 20 MG tablet, Take 1 tablet (20 mg total) by mouth daily. Take 1 tablet (20 mg total) every day x 3 days then take once daily as needed, Disp: 30 tablet, Rfl: 0 .  letrozole (FEMARA) 2.5 MG tablet, Take 2.5 mg by mouth daily. , Disp: , Rfl:  .  levothyroxine (SYNTHROID, LEVOTHROID) 125 MCG tablet, Take 1 tablet (125 mcg total) by mouth daily before breakfast., Disp: 90 tablet, Rfl: 3 .  lidocaine-prilocaine (EMLA) cream, Apply topically as needed., Disp: 30 g, Rfl: 6 .  Loperamide HCl (IMODIUM PO), Take by mouth as needed., Disp: , Rfl:  .  metoprolol succinate (TOPROL XL) 50 MG 24 hr tablet, Take 1 tablet (50 mg total) by mouth 2 (two) times daily. Take with or immediately following a meal., Disp: 60 tablet, Rfl: 6 .  Multiple Vitamins-Minerals (CENTRUM ADULTS PO), Take by mouth., Disp: , Rfl:  .  nitroGLYCERIN (NITROSTAT) 0.4 MG SL tablet, Place 1 tablet (0.4 mg total) under the tongue every 5 (five) minutes x 3 doses as needed for chest pain., Disp: 25 tablet, Rfl: 3 .  [  DISCONTINUED] ferrous sulfate 325 (65 FE) MG tablet, Take 325 mg by mouth 2 (two) times daily.  , Disp: , Rfl:   Allergies:  Allergies  Allergen Reactions  . Propoxyphene N-Acetaminophen Swelling    tongue swelling  . Penicillins Swelling    Facial swelling  . Cortisone Rash  . Myrbetriq [Mirabegron]     Rash on face  . Pseudoephedrine Hcl Er Rash and Other (See Comments)    Face gets red    Past Medical History, Surgical history, Social history, and Family History were reviewed and updated.  Review of Systems: As above  Physical Exam:  height is 5\' 7"  (1.702 m) and weight is 169 lb (76.658 kg). Her oral temperature is 98.2 F (36.8 C). Her blood pressure is 107/72 and her pulse is 110. Her  respiration is 16.   Well-developed and well-nourished white female. Her head and neck exam shows no ocular or oral lesions. She has no palpable cervical or supraclavicular lymph nodes. Lungs are clear bilaterally. Cardiac exam regular rate and rhythm with a 2/6 systolic ejection murmur. Breast exam shows right breast with no masses edema or erythema. There is no right axillary adenopathy. Left breast shows well-healed lumpectomy at the 2:00 position. There might be a little bit of erythema. The might be a little bit of swelling. There is no tenderness at the lumpectomy site. There is no masses in the left breast. There is no left axillary adenopathy.. Abdomen is soft. She's good bowel sounds. There is no palpable liver or spleen tip. Back exam shows no tenderness over the spine ribs or hips. Extremities shows no clubbing cyanosis or edema. Neurological exam shows no focal neurological deficits.  Lab Results  Component Value Date   WBC 3.6* 11/13/2014   HGB 11.0* 11/13/2014   HCT 33.8* 11/13/2014   MCV 91 11/13/2014   PLT 101* 11/13/2014     Chemistry      Component Value Date/Time   NA 144 11/13/2014 0811   NA 140 07/24/2014 0900   NA 137 01/28/2014 1354   K 3.6 11/13/2014 0811   K 4.2 07/24/2014 0900   K 4.3 01/28/2014 1354   CL 102 11/13/2014 0811   CL 105 07/24/2014 0900   CO2 25 11/13/2014 0811   CO2 23 07/24/2014 0900   CO2 21* 01/28/2014 1354   BUN 21 11/13/2014 0811   BUN 17 07/24/2014 0900   BUN 35.3* 01/28/2014 1354   CREATININE 1.2 11/13/2014 0811   CREATININE 1.0 07/24/2014 0900   CREATININE 1.3* 01/28/2014 1354      Component Value Date/Time   CALCIUM 9.3 11/13/2014 0811   CALCIUM 9.2 07/24/2014 0900   CALCIUM 9.4 01/28/2014 1354   ALKPHOS 57 11/13/2014 0811   ALKPHOS 48 09/24/2014 0810   ALKPHOS 63 01/28/2014 1354   AST 22 11/13/2014 0811   AST 17 09/24/2014 0810   AST 10 01/28/2014 1354   ALT 10 11/13/2014 0811   ALT 11 09/24/2014 0810   ALT 13 01/28/2014  1354   BILITOT 0.80 11/13/2014 0811   BILITOT 0.7 09/24/2014 0810   BILITOT 0.76 01/28/2014 1354         Impression and Plan: Kim [name] is 80 year old white female with stage IIA ductal carcinoma of the left breast. She had 4 positive lymph nodes. Her breast cancer was triple positive. She completed her adjuvant chemotherapy. She completed this in July of 2015.  We will finish up the Herceptin in June.  Again, if she needs  her bladder surgery in April, we can work around this without any problems.  Her platelet count is down a little bit. I don't see this as a problem right now.  We'll plan to get her back in 3 more weeks.  I spent about 25-30 minutes with her and her husband. I talked to them about her surgery. I talked to them about the Herceptin and why she needs a year of Herceptin. I told her this was standard of care for her type of breast cancer.    [name], MD 2/10/20168:45 AM

## 2014-11-13 NOTE — Patient Instructions (Signed)

## 2014-11-27 ENCOUNTER — Encounter: Payer: Self-pay | Admitting: *Deleted

## 2014-11-27 ENCOUNTER — Ambulatory Visit (INDEPENDENT_AMBULATORY_CARE_PROVIDER_SITE_OTHER): Payer: Medicare Other | Admitting: Cardiology

## 2014-11-27 ENCOUNTER — Encounter (HOSPITAL_COMMUNITY): Payer: Self-pay | Admitting: Cardiology

## 2014-11-27 ENCOUNTER — Encounter: Payer: Self-pay | Admitting: Cardiology

## 2014-11-27 VITALS — BP 134/74 | HR 76 | Ht 67.0 in | Wt 171.0 lb

## 2014-11-27 DIAGNOSIS — I2510 Atherosclerotic heart disease of native coronary artery without angina pectoris: Secondary | ICD-10-CM

## 2014-11-27 DIAGNOSIS — I421 Obstructive hypertrophic cardiomyopathy: Secondary | ICD-10-CM

## 2014-11-27 NOTE — Progress Notes (Signed)
Patient ID: Kim [name], female   DOB: [birth year], 80 y.o.   MRN: [id number] PCP: Dr. Sarajane Jews Oncologist: Dr. Marin Olp  80 yo with history of CAD and HOCM presents for cardiology evaluation given use of Herceptin with her breast cancer chemo.  Patient is s/p lumpectomy with lymph node biopsy in 2/15.  4/10 nodes positive.  She was started on docetaxol/carboplatin/Herceptin in 3/15 with plan for 6 cycles chemo. She had a tough time with her 1st chemotherapy session and felt very weak afterwards.  Shortly after, she developed unstable angina and ended up getting a DES to the mid RCA in 3/15.  Patient additionally has a history of HOCM.  This has been recognized on prior echoes.  She has severe asymmetric basal septal hypertrophy and SAM with LVOT gradient peak 58 mmHg on echo in 3/15 along with moderate MR.  Repeat echo in 7/15 showed LVOT gradient down to 30 mmHg on higher beta blocker.  Repeat echo in 11/15 showed no significant LVOT gradient but SAM still present.   She returns for followup today.  She has completed adjuvant chemotherapy and radiation and will be getting Herceptin until 7/16.   Overall, she is doing well.  No chest pain.  Stable dyspnea after walking 1-2 blocks.  No lightheadedness or syncope.  No orthopnea/PND.  She has bladder surgery set up for 4/16.   Labs (3/15): K 4.5, creatinine 0.84, LDL 91, HDL 46 Labs (5/15): K 3.9, creatinine 1.1 Labs (12/15): LDL 80, HDL 28, hemoglobin 10.9 Labs (1/16): K 3.7, creatinine 0.8 Labs (2/16): K 3.6, creatinine 1.2, HCT 34.4  PMH: 1. CAD: Unstable angina 3/15 with LHC showing 99% mRCA stenosis, treated with DES to mRCA.  2. Hypothyroidism 3. Diverticulosis 4. HTN 5. H/o TAH/BSO 6. Breast cancer: s/p lumpectomy with lymph node biopsy in 2/15.  4/10 nodes positive.  She was started on docetaxol/carboplatin/Herceptin in 3/15 with plan for 6 cycles chemo.  7. Hypertrophic obstructive cardiomyopathy: Echo (3/15) with severe focal basal septal  hypertrophy (22 mm), narrow LV outflow tract with mitral valve SAM and 58 mmHg peak LVOT resting gradient, EF 60-65%, moderate MR, moderate LAE, normal RV, lateral s' 10.4 cm/sec.  Patient says that her 2 grown sons has had echoes to screen for HOCM.  Echo (4/15) with EF 65-70%, severe focal basal septal hypertrophy, LVOT gradient 33 mmHg, SAM was present with moderate MR, normal RV size and systolic function, lateral S' 10.6, GLS -17.5%.  Cardiac MRI (5/15) with EF 65%, moderate asymmetric septal hypertrophy, systolic anterior motion of the mitral valve with moderate MR, there was no delayed enhancement.  Echo (7/15) with EF 65-70%, moderate ASH, peak LVOT gradient 30 mmHg, SAM with moderate MR, moderate to severe LAE.  Echo (11/15) with EF 55-60%, GLS -15%, no significant LVOT gradient, systolic anterior motion of the mitral valve with mild MR, normal RV size and systolic function.  8. Hyperlipidemia: Myalgias with atorvastatin  SH: Married, 2 children, retired, nonsmoker.   FH: No family history of HOCM or sudden death.  There is a family history of CAD.   ROS: All systems reviewed and negative except as per HPI.    Current Outpatient Prescriptions  Medication Sig Dispense Refill  . acetaminophen (TYLENOL) 650 MG CR tablet Take 650 mg by mouth every 8 (eight) hours as needed.    Marland Kitchen acidophilus (RISAQUAD) CAPS capsule Take 1 capsule by mouth daily.    . Ascorbic Acid (VITAMIN C) 1000 MG tablet Take 1,000 mg by mouth  daily.    . aspirin EC 81 MG EC tablet Take 1 tablet (81 mg total) by mouth daily.    . Calcium Citrate-Vitamin D (CITRACAL MAXIMUM PO) Take 1 tablet by mouth daily.     . cetirizine (ZYRTEC) 10 MG tablet Take 10 mg by mouth daily as needed for allergies.    Marland Kitchen clopidogrel (PLAVIX) 75 MG tablet Take 1 tablet (75 mg total) by mouth daily. 90 tablet 2  . CRESTOR 10 MG tablet TAKE 1 TABLET BY MOUTH ONCE A DAY 30 tablet 4  . Cyanocobalamin (VITAMIN B-12) 2500 MCG SUBL Place under the  tongue as needed.    . doxycycline (VIBRAMYCIN) 100 MG capsule Take 100 mg by mouth 2 (two) times daily.    . furosemide (LASIX) 20 MG tablet Take 1 tablet (20 mg total) by mouth daily. Take 1 tablet (20 mg total) every day x 3 days then take once daily as needed 30 tablet 0  . letrozole (FEMARA) 2.5 MG tablet Take 2.5 mg by mouth daily.     Marland Kitchen levothyroxine (SYNTHROID, LEVOTHROID) 125 MCG tablet Take 1 tablet (125 mcg total) by mouth daily before breakfast. 90 tablet 3  . lidocaine-prilocaine (EMLA) cream Apply topically as needed. 30 g 6  . Loperamide HCl (IMODIUM PO) Take by mouth as needed.    . metoprolol succinate (TOPROL XL) 50 MG 24 hr tablet Take 1 tablet (50 mg total) by mouth 2 (two) times daily. Take with or immediately following a meal. 60 tablet 6  . Multiple Vitamins-Minerals (CENTRUM ADULTS PO) Take by mouth.    . nitroGLYCERIN (NITROSTAT) 0.4 MG SL tablet Place 1 tablet (0.4 mg total) under the tongue every 5 (five) minutes x 3 doses as needed for chest pain. 25 tablet 3  . [DISCONTINUED] ferrous sulfate 325 (65 FE) MG tablet Take 325 mg by mouth 2 (two) times daily.       No current facility-administered medications for this visit.   BP 134/74 mmHg  Pulse 76  Ht 5\' 7"  (1.702 m)  Wt 171 lb (77.565 kg)  BMI 26.78 kg/m2  SpO2 94%  LMP 10/19/1991 General: NAD Neck: No JVD, no thyromegaly or thyroid nodule.  Lungs: Clear to auscultation bilaterally with normal respiratory effort. CV: Nondisplaced PMI.  Heart regular S1/S2, no S3/S4, 3/6 SEM RUSB.  No peripheral edema.  No carotid bruit.  Normal pedal pulses.  Abdomen: Soft, nontender, no hepatosplenomegaly, no distention.  Skin: Intact without lesions or rashes.  Neurologic: Alert and oriented x 3.  Psych: Normal affect. Extremities: No clubbing or cyanosis.   Assessment/Plan: 1. CAD: Status post PCI for unstable angina in 3/15 with DES to [facility].  - Continue ASA 81, statin, and Toprol XL.   - She needs to be on Plavix  until at least 3/16.   2. Hyperlipidemia: She is tolerating Crestor and lipids were ok in 12/15.  3. Hypertrophic obstructive cardiomyopathy: No family history of HOCM or sudden death (interestingly, her husband has HOCM).  Most recent echo did not show significant LVOT gradient but SAM was still present with mild MR.  She tolerated increase in Toprol XL without problems.  Cardiac MRI in 5/15 showed no delayed enhancement.  - Continue current Toprol XL.  - Per patient, her 2 sons (in their 75s) have had screening echoes to look for hypertrophic cardiomyopathy and did not show signs of HOCM.   4. Breast cancer/Herceptin: Continue screening for cardiomyopathy related to Herceptin.  Repeat echo in 3/15.  5. Pre-operative evaluation: Patient needs bladder surgery in 4/16.  She is symptomatically stable.  She can stop her Plavix 5 days prior to surgery.  She will not restart Plavix post-surgery (will be > 1 year post-PCI).  She will need to continue Toprol XL peri-operatively given HOCM.    [name] 11/27/2014

## 2014-11-27 NOTE — Patient Instructions (Signed)
Your physician has requested that you have an echocardiogram. Echocardiography is a painless test that uses sound waves to create images of your heart. It provides your doctor with information about the size and shape of your heart and how well your heart's chambers and valves are working. This procedure takes approximately one hour. There are no restrictions for this procedure. FIRST OF April 2016  You may stop your Plavix 5 days before your surgery in April. You do not need to restart taking Plavix after your surgery.  Your physician has requested that you have an echocardiogram. Echocardiography is a painless test that uses sound waves to create images of your heart. It provides your doctor with information about the size and shape of your heart and how well your heart's chambers and valves are working. This procedure takes approximately one hour. There are no restrictions for this procedure. END OF July 2016  Your physician wants you to follow-up in: 6 months with Dr Aundra Dubin. (August 2016). You will receive a reminder letter in the mail two months in advance. If you don't receive a letter, please call our office to schedule the follow-up appointment.

## 2014-11-28 ENCOUNTER — Encounter: Payer: Self-pay | Admitting: Podiatry

## 2014-11-28 ENCOUNTER — Ambulatory Visit (INDEPENDENT_AMBULATORY_CARE_PROVIDER_SITE_OTHER): Payer: Medicare Other | Admitting: Podiatry

## 2014-11-28 DIAGNOSIS — L600 Ingrowing nail: Secondary | ICD-10-CM

## 2014-11-28 NOTE — Progress Notes (Signed)
   Subjective:    Patient ID: Kim [name], female    DOB: 19-Mar-1935, 80 y.o.   MRN: [id number]  HPI  Pt stated rt foot great toenail is been sore over 1 month. The toenail is getting worse especially when putting pressure on. Tried soak with epson salt but no help.  Review of Systems  All other systems reviewed and are negative.      Objective:   Physical Exam        Assessment & Plan:

## 2014-11-28 NOTE — Progress Notes (Signed)
Subjective:     Patient ID: Kim [name], female   DOB: 10-23-1934, 80 y.o.   MRN: [id number]  HPI patient points to the right great toe stating it's been sore on the corner and I've tried to trim it and soak it without relief and it's been hurting me for over a month   Review of Systems  All other systems reviewed and are negative.      Objective:   Physical Exam  Constitutional: She is oriented to person, place, and time.  Cardiovascular: Intact distal pulses.   Musculoskeletal: Normal range of motion.  Neurological: She is oriented to person, place, and time.  Skin: Skin is warm.  Nursing note and vitals reviewed.  neuro vascular status was found to be intact with muscle strength adequate and range of motion subtalar midtarsal joint within normal limits. Patient's noted to have an incurvated right hallux medial border that sore when pressed and difficult for her to wear shoe gear with she is noted to be well oriented 3 and have good digital perfusion of all digits     Assessment:     Ingrown toenail deformity of the hallux right medial border that's painful when pressed    Plan:     H&P and condition discussed explained. I've recommended permanent procedure and explained the surgery and reviewed the condition and risk associated with this. Patient wants the surgery understanding the risk and at this time I infiltrated the right hallux 60 mg Xylocaine Marcaine mixture remove the hallux nail corner exposed matrix and applied phenol 3 applications 30 seconds followed by alcohol lavage and sterile dressing area gave instructions on soaks and reappoint

## 2014-11-28 NOTE — Patient Instructions (Signed)

## 2014-12-04 ENCOUNTER — Ambulatory Visit (HOSPITAL_BASED_OUTPATIENT_CLINIC_OR_DEPARTMENT_OTHER): Payer: Medicare Other

## 2014-12-04 ENCOUNTER — Ambulatory Visit (HOSPITAL_BASED_OUTPATIENT_CLINIC_OR_DEPARTMENT_OTHER): Payer: Medicare Other | Admitting: Family

## 2014-12-04 ENCOUNTER — Other Ambulatory Visit (HOSPITAL_BASED_OUTPATIENT_CLINIC_OR_DEPARTMENT_OTHER): Payer: Medicare Other | Admitting: Lab

## 2014-12-04 ENCOUNTER — Encounter: Payer: Self-pay | Admitting: Family

## 2014-12-04 VITALS — BP 142/67 | HR 70 | Temp 97.4°F | Resp 16 | Wt 170.0 lb

## 2014-12-04 DIAGNOSIS — D0512 Intraductal carcinoma in situ of left breast: Secondary | ICD-10-CM

## 2014-12-04 DIAGNOSIS — C50412 Malignant neoplasm of upper-outer quadrant of left female breast: Secondary | ICD-10-CM

## 2014-12-04 DIAGNOSIS — C50919 Malignant neoplasm of unspecified site of unspecified female breast: Secondary | ICD-10-CM

## 2014-12-04 DIAGNOSIS — C50912 Malignant neoplasm of unspecified site of left female breast: Secondary | ICD-10-CM

## 2014-12-04 DIAGNOSIS — Z170 Estrogen receptor positive status [ER+]: Secondary | ICD-10-CM

## 2014-12-04 DIAGNOSIS — Z5112 Encounter for antineoplastic immunotherapy: Secondary | ICD-10-CM

## 2014-12-04 DIAGNOSIS — C773 Secondary and unspecified malignant neoplasm of axilla and upper limb lymph nodes: Secondary | ICD-10-CM

## 2014-12-04 DIAGNOSIS — G629 Polyneuropathy, unspecified: Secondary | ICD-10-CM

## 2014-12-04 LAB — CBC WITH DIFFERENTIAL (CANCER CENTER ONLY)
BASO#: 0 10*3/uL (ref 0.0–0.2)
BASO%: 0.8 % (ref 0.0–2.0)
EOS%: 2.4 % (ref 0.0–7.0)
Eosinophils Absolute: 0.1 10*3/uL (ref 0.0–0.5)
HCT: 33.8 % — ABNORMAL LOW (ref 34.8–46.6)
HGB: 10.9 g/dL — ABNORMAL LOW (ref 11.6–15.9)
LYMPH#: 0.6 10*3/uL — ABNORMAL LOW (ref 0.9–3.3)
LYMPH%: 16.1 % (ref 14.0–48.0)
MCH: 29.1 pg (ref 26.0–34.0)
MCHC: 32.2 g/dL (ref 32.0–36.0)
MCV: 90 fL (ref 81–101)
MONO#: 0.2 10*3/uL (ref 0.1–0.9)
MONO%: 6.5 % (ref 0.0–13.0)
NEUT#: 2.8 10*3/uL (ref 1.5–6.5)
NEUT%: 74.2 % (ref 39.6–80.0)
Platelets: 136 10*3/uL — ABNORMAL LOW (ref 145–400)
RBC: 3.75 10*6/uL (ref 3.70–5.32)
RDW: 13.5 % (ref 11.1–15.7)
WBC: 3.7 10*3/uL — ABNORMAL LOW (ref 3.9–10.0)

## 2014-12-04 LAB — CMP (CANCER CENTER ONLY)
ALT(SGPT): 16 U/L (ref 10–47)
AST: 19 U/L (ref 11–38)
Albumin: 3.7 g/dL (ref 3.3–5.5)
Alkaline Phosphatase: 52 U/L (ref 26–84)
BUN, Bld: 17 mg/dL (ref 7–22)
CO2: 26 mEq/L (ref 18–33)
Calcium: 9.4 mg/dL (ref 8.0–10.3)
Chloride: 102 mEq/L (ref 98–108)
Creat: 1.3 mg/dl — ABNORMAL HIGH (ref 0.6–1.2)
Glucose, Bld: 137 mg/dL — ABNORMAL HIGH (ref 73–118)
Potassium: 4 mEq/L (ref 3.3–4.7)
Sodium: 143 mEq/L (ref 128–145)
Total Bilirubin: 0.8 mg/dl (ref 0.20–1.60)
Total Protein: 7.1 g/dL (ref 6.4–8.1)

## 2014-12-04 MED ORDER — SODIUM CHLORIDE 0.9 % IJ SOLN
10.0000 mL | INTRAMUSCULAR | Status: DC | PRN
  Administered 2014-12-04: 10 mL
  Filled 2014-12-04: qty 10

## 2014-12-04 MED ORDER — ZOLEDRONIC ACID 4 MG/5ML IV CONC: 4.0000 mg | Freq: Once | INTRAVENOUS | Status: DC

## 2014-12-04 MED ORDER — DIPHENHYDRAMINE HCL 25 MG PO CAPS
50.0000 mg | ORAL_CAPSULE | Freq: Once | ORAL | Status: AC
  Administered 2014-12-04: 50 mg via ORAL

## 2014-12-04 MED ORDER — SODIUM CHLORIDE 0.9 % IV SOLN
Freq: Once | INTRAVENOUS | Status: AC
  Administered 2014-12-04: 10:00:00 via INTRAVENOUS

## 2014-12-04 MED ORDER — DIPHENHYDRAMINE HCL 25 MG PO CAPS
ORAL_CAPSULE | ORAL | Status: AC
  Filled 2014-12-04: qty 2

## 2014-12-04 MED ORDER — HEPARIN SOD (PORK) LOCK FLUSH 100 UNIT/ML IV SOLN
500.0000 [IU] | Freq: Once | INTRAVENOUS | Status: AC | PRN
  Administered 2014-12-04: 500 [IU]
  Filled 2014-12-04: qty 5

## 2014-12-04 MED ORDER — SODIUM CHLORIDE 0.9 % IV SOLN
6.0000 mg/kg | Freq: Once | INTRAVENOUS | Status: AC
  Administered 2014-12-04: 462 mg via INTRAVENOUS
  Filled 2014-12-04: qty 22

## 2014-12-04 MED ORDER — ACETAMINOPHEN 325 MG PO TABS
ORAL_TABLET | ORAL | Status: AC
  Filled 2014-12-04: qty 2

## 2014-12-04 MED ORDER — ACETAMINOPHEN 325 MG PO TABS
650.0000 mg | ORAL_TABLET | Freq: Once | ORAL | Status: AC
  Administered 2014-12-04: 650 mg via ORAL

## 2014-12-04 NOTE — Progress Notes (Signed)
Kim [name]  [phone] [phone]  ID: AI SONNENFELD OB: [birth year] MR#: [id number] IRJ#:[id number] Patient Care Team: Laurey Morale, MD as PCP - General  DIAGNOSIS: Stage IIA (T1N1M0) adenocarcinoma of the left breast-triple positive  INTERVAL HISTORY: Kim [name] is here today for follow-up and treatment. She is doing well and has had no complications.  She denies fatigue, fever, chills, n/v, cough, rash, headache, dizziness, SOB, chest pain, palpitations, abdominal pain, diarrhea, blood in urine or stool. She has some constipation at times but is relieved with stool softeners. No episodes of bleeding.  She has had no swelling or tenderness in her extremities. She has some mild neuropathy in her feet that is unchanged.  Her appetite is good and she is staying hydrated. Her weight is stable at 170 lbs. In November, her echo showed an ejection fraction of 59%.  CURRENT TREATMENT: Status post cycle 4 of Abraxane/Herceptin  Maintenance Herceptin q 3wk  Radiation therapy to the left breast completed Zometa 4 mg IV every 6 months  Femara 2.5 mg by mouth daily (started 07/01/14)  REVIEW OF SYSTEMS: All other 10 point review of systems is negative.   PAST MEDICAL HISTORY: Past Medical History  Diagnosis Date  . Allergy   . Allergic rhinitis   . Hypothyroidism   . Urinary tract infection   . Diverticulosis   . Hx of adenomatous colonic polyps 02/1999  . Tubulovillous adenoma of colon 12/2009    with HGD  . Low back pain     gets ESI from Dr. Rennis Harding  . Arthritis   . Hypertension   . HOH (hard of hearing)   . Wears hearing aid   . Breast cancer of upper-outer quadrant of left female breast 11/08/13  . CAD (coronary artery disease)     a. 12/2013 Cath/PCI: LM nl, LAD 20p, 67m, 30d, D1 30p, LCX 20p, 85m, OM1 20, Mo2 40p, RCA 30p, 80m (4.0x16 Promus Premier DES), 20d, RPL 30, RPDA 70p, 91m, EF 65-70%.  . Hyperlipidemia   . S/P radiation therapy  05/14/2014-06/26/2014    1) Left Breast  / 50 Gy in 25 fractions, 2) Left Supraclavicular fossa/ 47.5 Gy in 25 fractions, 3) Left Posterior Axillary boost / 4.825 Gy in 25 fractions, 4) Left Breast boost / 10 Gy in 5 fractions     PAST SURGICAL HISTORY: Past Surgical History  Procedure Laterality Date  . Abdominal hysterectomy    . Bilateral salpingoophorectomy      see Laurin Coder NP for GYN exams  . Cataract extraction w/ intraocular lens  implant, bilateral  2012    bilateral  . Colonoscopy  01-14-12    per Dr. Fuller Plan, adenomatous polyps, repeat in 3 years   . Breast lumpectomy with needle localization and axillary lymph node dissection Left 11/05/2013    Procedure: LEFT BREAST LUMPECTOMY WITH NEEDLE LOCALIZATION and axillary lymph Node Dissection;  Surgeon: Edward Jolly, MD;  Location: [facility];  Service: General;  Laterality: Left;  . Breast surgery    . Portacath placement Right 12/11/2013    Procedure: INSERTION PORT-A-CATH;  Surgeon: Edward Jolly, MD;  Location: [facility];  Service: General;  Laterality: Right;  Subclavian Vein;   . Left heart catheterization with coronary angiogram N/A 12/19/2013    Procedure: LEFT HEART CATHETERIZATION WITH CORONARY ANGIOGRAM;  Surgeon: Burnell Blanks, MD;  Location[facility] C[facility];  Service: Cardiovascular;  Laterality: N/A;   FAMILY HISTORY Family History  Problem Relation Age of Onset  . Alcohol abuse Father   . Kidney failure Mother   . Colon cancer      family   GYNECOLOGIC HISTORY:  Patient's last menstrual period was 10/19/1991.   SOCIAL HISTORY:  History   Social History  . Marital Status: Married    Spouse Name: N/A  . Number of Children: 2  . Years of Education: N/A   Occupational History  . Retired-school system    Social History Main Topics  . Smoking status: Never Smoker   . Smokeless tobacco: Never Used     Comment: never used tobacco  . Alcohol Use: No  . Drug Use:  No  . Sexual Activity: Not Currently   Other Topics Concern  . Not on file   Social History Narrative   ADVANCED DIRECTIVES: <no information>  HEALTH MAINTENANCE: History  Substance Use Topics  . Smoking status: Never Smoker   . Smokeless tobacco: Never Used     Comment: never used tobacco  . Alcohol Use: No   Colonoscopy: PAP: Bone density: Lipid panel:  Allergies  Allergen Reactions  . Propoxyphene N-Acetaminophen Swelling    tongue swelling  . Penicillins Swelling    Facial swelling  . Cortisone Rash  . Myrbetriq [Mirabegron]     Rash on face  . Pseudoephedrine Hcl Er Rash and Other (See Comments)    Face gets red   Current Outpatient Prescriptions  Medication Sig Dispense Refill  . acetaminophen (TYLENOL) 650 MG CR tablet Take 650 mg by mouth every 8 (eight) hours as needed.    Marland Kitchen acidophilus (RISAQUAD) CAPS capsule Take 1 capsule by mouth daily.    . Ascorbic Acid (VITAMIN C) 1000 MG tablet Take 1,000 mg by mouth daily.    Marland Kitchen aspirin EC 81 MG EC tablet Take 1 tablet (81 mg total) by mouth daily.    . Calcium Citrate-Vitamin D (CITRACAL MAXIMUM PO) Take 1 tablet by mouth daily.     . cetirizine (ZYRTEC) 10 MG tablet Take 10 mg by mouth daily as needed for allergies.    Marland Kitchen clopidogrel (PLAVIX) 75 MG tablet Take 1 tablet (75 mg total) by mouth daily. 90 tablet 2  . CRESTOR 10 MG tablet TAKE 1 TABLET BY MOUTH ONCE A DAY 30 tablet 4  . Cyanocobalamin (VITAMIN B-12) 2500 MCG SUBL Place under the tongue as needed.    . doxycycline (VIBRAMYCIN) 100 MG capsule Take 100 mg by mouth 2 (two) times daily.    . furosemide (LASIX) 20 MG tablet Take 1 tablet (20 mg total) by mouth daily. Take 1 tablet (20 mg total) every day x 3 days then take once daily as needed 30 tablet 0  . letrozole (FEMARA) 2.5 MG tablet Take 2.5 mg by mouth daily.     Marland Kitchen levothyroxine (SYNTHROID, LEVOTHROID) 125 MCG tablet Take 1 tablet (125 mcg total) by mouth daily before breakfast. 90 tablet 3  .  lidocaine-prilocaine (EMLA) cream Apply topically as needed. 30 g 6  . Loperamide HCl (IMODIUM PO) Take by mouth as needed.    . metoprolol succinate (TOPROL XL) 50 MG 24 hr tablet Take 1 tablet (50 mg total) by mouth 2 (two) times daily. Take with or immediately following a meal. 60 tablet 6  . Multiple Vitamins-Minerals (CENTRUM ADULTS PO) Take by mouth.    . nitroGLYCERIN (NITROSTAT) 0.4 MG SL tablet Place 1 tablet (0.4 mg total) under the tongue every 5 (five) minutes x 3 doses as needed  for chest pain. 25 tablet 3  . [DISCONTINUED] ferrous sulfate 325 (65 FE) MG tablet Take 325 mg by mouth 2 (two) times daily.       No current facility-administered medications for this visit.   OBJECTIVE: Filed Vitals:   12/04/14 0942  BP: 142/67  Pulse: 70  Temp: 97.4 F (36.3 C)  Resp: 16   Body mass index is 26.62 kg/(m^2). ECOG FS:0 - Asymptomatic Ocular: Sclerae unicteric, pupils equal, round and reactive to light Ear-nose-throat: Oropharynx clear, dentition fair Lymphatic: No cervical or supraclavicular adenopathy Lungs no rales or rhonchi, good excursion bilaterally Heart regular rate and rhythm, no murmur appreciated Abd soft, nontender, positive bowel sounds MSK no focal spinal tenderness, no joint edema Neuro: non-focal, well-oriented, appropriate affect Breasts: No changes, no mass, lesion, rash or lymphedema.   LAB RESULTS: CMP     Component Value Date/Time   NA 144 11/13/2014 0811   NA 140 07/24/2014 0900   NA 137 01/28/2014 1354   K 3.6 11/13/2014 0811   K 4.2 07/24/2014 0900   K 4.3 01/28/2014 1354   CL 102 11/13/2014 0811   CL 105 07/24/2014 0900   CO2 25 11/13/2014 0811   CO2 23 07/24/2014 0900   CO2 21* 01/28/2014 1354   GLUCOSE 144* 11/13/2014 0811   GLUCOSE 85 07/24/2014 0900   GLUCOSE 156* 01/28/2014 1354   BUN 21 11/13/2014 0811   BUN 17 07/24/2014 0900   BUN 35.3* 01/28/2014 1354   CREATININE 1.2 11/13/2014 0811   CREATININE 1.0 07/24/2014 0900    CREATININE 1.3* 01/28/2014 1354   CALCIUM 9.3 11/13/2014 0811   CALCIUM 9.2 07/24/2014 0900   CALCIUM 9.4 01/28/2014 1354   PROT 6.8 11/13/2014 0811   PROT 6.2 09/24/2014 0810   PROT 6.7 01/28/2014 1354   ALBUMIN 3.8 09/24/2014 0810   ALBUMIN 3.7 01/28/2014 1354   AST 22 11/13/2014 0811   AST 17 09/24/2014 0810   AST 10 01/28/2014 1354   ALT 10 11/13/2014 0811   ALT 11 09/24/2014 0810   ALT 13 01/28/2014 1354   ALKPHOS 57 11/13/2014 0811   ALKPHOS 48 09/24/2014 0810   ALKPHOS 63 01/28/2014 1354   BILITOT 0.80 11/13/2014 0811   BILITOT 0.7 09/24/2014 0810   BILITOT 0.76 01/28/2014 1354   GFRNONAA 65* 12/20/2013 0154   GFRAA 76* 12/20/2013 0154   No results found for: SPEP Lab Results  Component Value Date   WBC 3.7* 12/04/2014   NEUTROABS 2.8 12/04/2014   HGB 10.9* 12/04/2014   HCT 33.8* 12/04/2014   MCV 90 12/04/2014   PLT 136* 12/04/2014   No results found for: LABCA2 No components found for: [id number] No results for input(s): INR in the last 168 hours.  STUDIES: No results found.  ASSESSMENT/PLAN: Kim [name] is 80 year old white female with stage IIA ductal carcinoma of the left breast. She had 4 positive lymph nodes. Her breast cancer was triple positive. She completed her adjuvant chemotherapy in July 2015. She has completed radiation to the left breast. She is doing well with Herceptin. No complications. She is asymptomatic today. Today Hgb 10.9 MCV 90 platelets 136 WBC 3.7.  We will proceed with cycle 14 of Herceptin.  She will continue to get Zometa every 6 months.  She will continue taking the Femara.  She has her appointment and treatment schedule.  She knows to call here with any questions or concerns and to go to the ED in the even of an emergency. We can certainly  see her sooner if need be.   Eliezer Bottom, NP 12/04/2014 10:08 AM

## 2014-12-04 NOTE — Patient Instructions (Signed)
[facility] Discharge Instructions for Patients Receiving Chemotherapy  Today you received the following chemotherapy agents Herceptin  To help prevent nausea and vomiting after your treatment, we encourage you to take your nausea medication     If you develop nausea and vomiting that is not controlled by your nausea medication, call the clinic.   BELOW ARE SYMPTOMS THAT SHOULD BE REPORTED IMMEDIATELY:  *FEVER GREATER THAN 100.5 F  *CHILLS WITH OR WITHOUT FEVER  NAUSEA AND VOMITING THAT IS NOT CONTROLLED WITH YOUR NAUSEA MEDICATION  *UNUSUAL SHORTNESS OF BREATH  *UNUSUAL BRUISING OR BLEEDING  TENDERNESS IN MOUTH AND THROAT WITH OR WITHOUT PRESENCE OF ULCERS  *URINARY PROBLEMS  *BOWEL PROBLEMS  UNUSUAL RASH Items with * indicate a potential emergency and should be followed up as soon as possible.  Feel free to call the clinic you have any questions or concerns. The clinic phone number is [phone].    

## 2014-12-09 ENCOUNTER — Other Ambulatory Visit: Payer: Self-pay | Admitting: *Deleted

## 2014-12-09 DIAGNOSIS — C50912 Malignant neoplasm of unspecified site of left female breast: Secondary | ICD-10-CM

## 2014-12-09 NOTE — Telephone Encounter (Signed)
She should contact provider that prescribed this for her.

## 2014-12-10 ENCOUNTER — Other Ambulatory Visit: Payer: Self-pay | Admitting: *Deleted

## 2014-12-10 DIAGNOSIS — I2510 Atherosclerotic heart disease of native coronary artery without angina pectoris: Secondary | ICD-10-CM

## 2014-12-10 MED ORDER — LETROZOLE 2.5 MG PO TABS: 2.5000 mg | ORAL_TABLET | Freq: Every day | ORAL | Status: DC

## 2014-12-11 ENCOUNTER — Other Ambulatory Visit: Payer: Self-pay | Admitting: Family

## 2014-12-12 ENCOUNTER — Other Ambulatory Visit: Payer: Self-pay | Admitting: Cardiology

## 2014-12-13 ENCOUNTER — Telehealth: Payer: Self-pay

## 2014-12-13 NOTE — Telephone Encounter (Signed)
Pt was advised by Dr Aundra Dubin to take plavix, not doxycycline, until 5 days before surgery then discontinue.   Pt verbalized understanding.

## 2014-12-18 ENCOUNTER — Encounter: Payer: Self-pay | Admitting: Gastroenterology

## 2014-12-26 ENCOUNTER — Ambulatory Visit (HOSPITAL_BASED_OUTPATIENT_CLINIC_OR_DEPARTMENT_OTHER): Payer: Medicare Other | Admitting: Hematology & Oncology

## 2014-12-26 ENCOUNTER — Ambulatory Visit (HOSPITAL_BASED_OUTPATIENT_CLINIC_OR_DEPARTMENT_OTHER): Payer: Medicare Other

## 2014-12-26 ENCOUNTER — Other Ambulatory Visit (HOSPITAL_BASED_OUTPATIENT_CLINIC_OR_DEPARTMENT_OTHER): Payer: Medicare Other

## 2014-12-26 ENCOUNTER — Encounter: Payer: Self-pay | Admitting: Hematology & Oncology

## 2014-12-26 VITALS — BP 118/61 | HR 78 | Temp 97.5°F | Resp 18 | Wt 171.0 lb

## 2014-12-26 DIAGNOSIS — C773 Secondary and unspecified malignant neoplasm of axilla and upper limb lymph nodes: Secondary | ICD-10-CM

## 2014-12-26 DIAGNOSIS — Z5112 Encounter for antineoplastic immunotherapy: Secondary | ICD-10-CM | POA: Diagnosis not present

## 2014-12-26 DIAGNOSIS — C50412 Malignant neoplasm of upper-outer quadrant of left female breast: Secondary | ICD-10-CM

## 2014-12-26 DIAGNOSIS — I2510 Atherosclerotic heart disease of native coronary artery without angina pectoris: Secondary | ICD-10-CM | POA: Diagnosis not present

## 2014-12-26 DIAGNOSIS — C50919 Malignant neoplasm of unspecified site of unspecified female breast: Secondary | ICD-10-CM

## 2014-12-26 LAB — CMP (CANCER CENTER ONLY)
ALT(SGPT): 16 U/L (ref 10–47)
AST: 18 U/L (ref 11–38)
Albumin: 3.8 g/dL (ref 3.3–5.5)
Alkaline Phosphatase: 45 U/L (ref 26–84)
BUN, Bld: 21 mg/dL (ref 7–22)
CO2: 28 mEq/L (ref 18–33)
Calcium: 9.3 mg/dL (ref 8.0–10.3)
Chloride: 101 mEq/L (ref 98–108)
Creat: 1.2 mg/dl (ref 0.6–1.2)
Glucose, Bld: 118 mg/dL (ref 73–118)
Potassium: 4.1 mEq/L (ref 3.3–4.7)
Sodium: 143 mEq/L (ref 128–145)
Total Bilirubin: 0.8 mg/dl (ref 0.20–1.60)
Total Protein: 6.9 g/dL (ref 6.4–8.1)

## 2014-12-26 LAB — CBC WITH DIFFERENTIAL (CANCER CENTER ONLY)
BASO#: 0 10*3/uL (ref 0.0–0.2)
BASO%: 0.5 % (ref 0.0–2.0)
EOS%: 3.2 % (ref 0.0–7.0)
Eosinophils Absolute: 0.1 10*3/uL (ref 0.0–0.5)
HCT: 33.3 % — ABNORMAL LOW (ref 34.8–46.6)
HGB: 10.9 g/dL — ABNORMAL LOW (ref 11.6–15.9)
LYMPH#: 0.7 10*3/uL — ABNORMAL LOW (ref 0.9–3.3)
LYMPH%: 18.9 % (ref 14.0–48.0)
MCH: 28.8 pg (ref 26.0–34.0)
MCHC: 32.7 g/dL (ref 32.0–36.0)
MCV: 88 fL (ref 81–101)
MONO#: 0.4 10*3/uL (ref 0.1–0.9)
MONO%: 10.2 % (ref 0.0–13.0)
NEUT#: 2.5 10*3/uL (ref 1.5–6.5)
NEUT%: 67.2 % (ref 39.6–80.0)
Platelets: 146 10*3/uL (ref 145–400)
RBC: 3.79 10*6/uL (ref 3.70–5.32)
RDW: 13.6 % (ref 11.1–15.7)
WBC: 3.7 10*3/uL — ABNORMAL LOW (ref 3.9–10.0)

## 2014-12-26 MED ORDER — ACETAMINOPHEN 325 MG PO TABS
ORAL_TABLET | ORAL | Status: AC
  Filled 2014-12-26: qty 2

## 2014-12-26 MED ORDER — ALTEPLASE 2 MG IJ SOLR
2.0000 mg | Freq: Once | INTRAMUSCULAR | Status: DC | PRN
  Filled 2014-12-26: qty 2

## 2014-12-26 MED ORDER — DIPHENHYDRAMINE HCL 25 MG PO CAPS
50.0000 mg | ORAL_CAPSULE | Freq: Once | ORAL | Status: AC
  Administered 2014-12-26: 50 mg via ORAL

## 2014-12-26 MED ORDER — SODIUM CHLORIDE 0.9 % IJ SOLN
3.0000 mL | INTRAMUSCULAR | Status: DC | PRN
  Filled 2014-12-26: qty 10

## 2014-12-26 MED ORDER — SODIUM CHLORIDE 0.9 % IV SOLN
Freq: Once | INTRAVENOUS | Status: AC
  Administered 2014-12-26: 10:00:00 via INTRAVENOUS

## 2014-12-26 MED ORDER — SODIUM CHLORIDE 0.9 % IJ SOLN
10.0000 mL | INTRAMUSCULAR | Status: DC | PRN
  Administered 2014-12-26: 10 mL
  Filled 2014-12-26: qty 10

## 2014-12-26 MED ORDER — TRASTUZUMAB CHEMO INJECTION 440 MG
6.0000 mg/kg | Freq: Once | INTRAVENOUS | Status: AC
  Administered 2014-12-26: 462 mg via INTRAVENOUS
  Filled 2014-12-26: qty 22

## 2014-12-26 MED ORDER — HEPARIN SOD (PORK) LOCK FLUSH 100 UNIT/ML IV SOLN
250.0000 [IU] | Freq: Once | INTRAVENOUS | Status: DC | PRN
  Filled 2014-12-26: qty 5

## 2014-12-26 MED ORDER — DIPHENHYDRAMINE HCL 25 MG PO CAPS
ORAL_CAPSULE | ORAL | Status: AC
  Filled 2014-12-26: qty 2

## 2014-12-26 MED ORDER — HEPARIN SOD (PORK) LOCK FLUSH 100 UNIT/ML IV SOLN
500.0000 [IU] | Freq: Once | INTRAVENOUS | Status: AC | PRN
  Administered 2014-12-26: 500 [IU]
  Filled 2014-12-26: qty 5

## 2014-12-26 MED ORDER — ACETAMINOPHEN 325 MG PO TABS
650.0000 mg | ORAL_TABLET | Freq: Once | ORAL | Status: AC
  Administered 2014-12-26: 650 mg via ORAL

## 2014-12-26 NOTE — Progress Notes (Signed)
Hematology and Oncology Follow Up Visit  Kim [name] [id number] Feb 15, 1935 80 y.o. 12/26/2014   Principle Diagnosis:  Stage IIA (T1N1M0) adenocarcinoma of the left breast-triple positive  Current Therapy:   Status post cycle 4 of Abraxane/Herceptin Maintenance Herceptin q 3wk Zometa 4 mg IV every 6 months Femara 2.5 mg by mouth daily      Interim History:  [name] is back for followup. She is doing pretty well. She apparently is going to need surgery for her bladder in April. This has been scheduled for April 15. She has a "dropped bladder". This is causing some pressure on her intestines. She has a lot of incontinence.  From my point of view, I don't see any problems with her having surgery. Her blood counts should be okay. She's not on any chemotherapy.  She's tolerating the Herceptin well. I think we will finish of the Herceptin the end of June.  She's had no leg swelling. She had a foot infection I think earlier this year. This is pretty much resolved.  She currently is on Plavix. She does have some bruising. She's had no bleeding.  There's been no nausea vomiting. She's had a good appetite. She's had no cough. She's had no rashes.  She has a performance status of ECOG 1.    Medications:  Current outpatient prescriptions:  .  acetaminophen (TYLENOL) 650 MG CR tablet, Take 650 mg by mouth every 8 (eight) hours as needed., Disp: , Rfl:  .  acidophilus (RISAQUAD) CAPS capsule, Take 1 capsule by mouth daily., Disp: , Rfl:  .  Ascorbic Acid (VITAMIN C) 1000 MG tablet, Take 1,000 mg by mouth daily., Disp: , Rfl:  .  aspirin EC 81 MG EC tablet, Take 1 tablet (81 mg total) by mouth daily., Disp: , Rfl:  .  Calcium Citrate-Vitamin D (CITRACAL MAXIMUM PO), Take 1 tablet by mouth daily. , Disp: , Rfl:  .  cetirizine (ZYRTEC) 10 MG tablet, Take 10 mg by mouth daily as needed for allergies., Disp: , Rfl:  .  clopidogrel (PLAVIX) 75 MG tablet, Take 1 tablet (75 mg total) by mouth  daily. (Patient taking differently: Take 75 mg by mouth daily. ), Disp: 90 tablet, Rfl: 2 .  CRESTOR 10 MG tablet, TAKE 1 TABLET BY MOUTH ONCE A DAY, Disp: 30 tablet, Rfl: 4 .  Cyanocobalamin (VITAMIN B-12) 2500 MCG SUBL, Place under the tongue as needed., Disp: , Rfl:  .  furosemide (LASIX) 20 MG tablet, Take 1 tablet (20 mg total) by mouth daily. Take 1 tablet (20 mg total) every day x 3 days then take once daily as needed, Disp: 30 tablet, Rfl: 0 .  letrozole (FEMARA) 2.5 MG tablet, Take 1 tablet (2.5 mg total) by mouth daily., Disp: 30 tablet, Rfl: 5 .  levothyroxine (SYNTHROID, LEVOTHROID) 125 MCG tablet, Take 1 tablet (125 mcg total) by mouth daily before breakfast., Disp: 90 tablet, Rfl: 3 .  lidocaine-prilocaine (EMLA) cream, Apply topically as needed., Disp: 30 g, Rfl: 6 .  Loperamide HCl (IMODIUM PO), Take by mouth as needed., Disp: , Rfl:  .  metoprolol succinate (TOPROL XL) 50 MG 24 hr tablet, Take 1 tablet (50 mg total) by mouth 2 (two) times daily. Take with or immediately following a meal., Disp: 60 tablet, Rfl: 6 .  Multiple Vitamins-Minerals (CENTRUM ADULTS PO), Take by mouth., Disp: , Rfl:  .  nitroGLYCERIN (NITROSTAT) 0.4 MG SL tablet, Place 1 tablet (0.4 mg total) under the tongue every 5 (  five) minutes x 3 doses as needed for chest pain., Disp: 25 tablet, Rfl: 3 .  doxycycline (VIBRAMYCIN) 100 MG capsule, Take 100 mg by mouth 2 (two) times daily., Disp: , Rfl:  .  [DISCONTINUED] ferrous sulfate 325 (65 FE) MG tablet, Take 325 mg by mouth 2 (two) times daily.  , Disp: , Rfl:  No current facility-administered medications for this visit.  Facility-Administered Medications Ordered in Other Visits:  .  0.9 %  sodium chloride infusion, , Intravenous, Once, [name], MD .  acetaminophen (TYLENOL) tablet 650 mg, 650 mg, Oral, Once, [name], MD .  alteplase (CATHFLO ACTIVASE) injection 2 mg, 2 mg, Intracatheter, Once PRN, [name], MD .  diphenhydrAMINE (BENADRYL)  capsule 50 mg, 50 mg, Oral, Once, [name], MD .  heparin lock flush 100 unit/mL, 500 Units, Intracatheter, Once PRN, [name], MD .  heparin lock flush 100 unit/mL, 250 Units, Intracatheter, Once PRN, [name], MD .  sodium chloride 0.9 % injection 10 mL, 10 mL, Intracatheter, PRN, [name], MD .  sodium chloride 0.9 % injection 3 mL, 3 mL, Intravenous, PRN, [name], MD .  trastuzumab (HERCEPTIN) 462 mg in sodium chloride 0.9 % 250 mL chemo infusion, 6 mg/kg (Treatment Plan Actual), Intravenous, Once, [name], MD  Allergies:  Allergies  Allergen Reactions  . Propoxyphene N-Acetaminophen Swelling    tongue swelling  . Penicillins Swelling    Facial swelling  . Cortisone Rash  . Myrbetriq [Mirabegron]     Rash on face  . Pseudoephedrine Hcl Er Rash and Other (See Comments)    Face gets red    Past Medical History, Surgical history, Social history, and Family History were reviewed and updated.  Review of Systems: As above  Physical Exam:  weight is 171 lb (77.565 kg). Her temperature is 97.5 F (36.4 C). Her blood pressure is 118/61 and her pulse is 78. Her respiration is 18.   Well-developed and well-nourished white female. Her head and neck exam shows no ocular or oral lesions. She has no palpable cervical or supraclavicular lymph nodes. Lungs are clear bilaterally. Cardiac exam regular rate and rhythm with a 2/6 systolic ejection murmur. Breast exam shows right breast with no masses edema or erythema. There is no right axillary adenopathy. Left breast shows well-healed lumpectomy at the 2:00 position. There might be a little bit of erythema. The might be a little bit of swelling. There is no tenderness at the lumpectomy site. There is no masses in the left breast. There is no left axillary adenopathy.. Abdomen is soft. She's good bowel sounds. There is no palpable liver or spleen tip. Back exam shows no tenderness over the spine ribs or hips.  Extremities shows no clubbing cyanosis or edema. Neurological exam shows no focal neurological deficits.  Lab Results  Component Value Date   WBC 3.7* 12/26/2014   HGB 10.9* 12/26/2014   HCT 33.3* 12/26/2014   MCV 88 12/26/2014   PLT 146 12/26/2014     Chemistry      Component Value Date/Time   NA 143 12/26/2014 0851   NA 140 07/24/2014 0900   NA 137 01/28/2014 1354   K 4.1 12/26/2014 0851   K 4.2 07/24/2014 0900   K 4.3 01/28/2014 1354   CL 101 12/26/2014 0851   CL 105 07/24/2014 0900   CO2 28 12/26/2014 0851   CO2 23 07/24/2014 0900   CO2 21* 01/28/2014 1354  BUN 21 12/26/2014 0851   BUN 17 07/24/2014 0900   BUN 35.3* 01/28/2014 1354   CREATININE 1.2 12/26/2014 0851   CREATININE 1.0 07/24/2014 0900   CREATININE 1.3* 01/28/2014 1354      Component Value Date/Time   CALCIUM 9.3 12/26/2014 0851   CALCIUM 9.2 07/24/2014 0900   CALCIUM 9.4 01/28/2014 1354   ALKPHOS 45 12/26/2014 0851   ALKPHOS 48 09/24/2014 0810   ALKPHOS 63 01/28/2014 1354   AST 18 12/26/2014 0851   AST 17 09/24/2014 0810   AST 10 01/28/2014 1354   ALT 16 12/26/2014 0851   ALT 11 09/24/2014 0810   ALT 13 01/28/2014 1354   BILITOT 0.80 12/26/2014 0851   BILITOT 0.7 09/24/2014 0810   BILITOT 0.76 01/28/2014 1354         Impression and Plan: Ms. Balcom is 80 year old white female with stage IIA ductal carcinoma of the left breast. She had 4 positive lymph nodes. Her breast cancer was triple positive. She completed her adjuvant chemotherapy. She completed this in July of 2015.  We will finish up the Herceptin in June.  Again, I do not see any problems with her having her bladder surgery in April. She was scheduled for a treatment a couple days beforehand. I will not do that treatment and I'll push her next treatment out until the end of April.  She is due for a echocardiogram in April. Her cardiologist is taking care of this.  We'll plan to get her back in 6 more weeks.  She is on Femara.  She is doing okay with the Femara.  I spent about 25-30 minutes with her and her husband. I talked to them about her surgery. I talked to them about the Herceptin and why she needs a year of Herceptin. I told her this was standard of care for her type of breast cancer.    [name], MD 3/24/20169:33 AM

## 2014-12-26 NOTE — Patient Instructions (Signed)
[facility] Discharge Instructions for Patients Receiving Chemotherapy  Today you received the following chemotherapy agents Herceptin  To help prevent nausea and vomiting after your treatment, we encourage you to take your nausea medication    If you develop nausea and vomiting that is not controlled by your nausea medication, call the clinic.   BELOW ARE SYMPTOMS THAT SHOULD BE REPORTED IMMEDIATELY:  *FEVER GREATER THAN 100.5 F  *CHILLS WITH OR WITHOUT FEVER  NAUSEA AND VOMITING THAT IS NOT CONTROLLED WITH YOUR NAUSEA MEDICATION  *UNUSUAL SHORTNESS OF BREATH  *UNUSUAL BRUISING OR BLEEDING  TENDERNESS IN MOUTH AND THROAT WITH OR WITHOUT PRESENCE OF ULCERS  *URINARY PROBLEMS  *BOWEL PROBLEMS  UNUSUAL RASH Items with * indicate a potential emergency and should be followed up as soon as possible.  Feel free to call the clinic you have any questions or concerns. The clinic phone number is [phone].  Please show the CHEMO ALERT CARD at check-in to the Emergency Department and triage nurse.   

## 2015-01-03 ENCOUNTER — Other Ambulatory Visit: Payer: Self-pay | Admitting: Obstetrics & Gynecology

## 2015-01-03 NOTE — Patient Instructions (Addendum)
   Your procedure is scheduled on:  Friday, April 15  Enter through the [employer] [facility] at: 6 AM Pick up the phone at the desk and dial [phone] and inform us of your arrival.  Please call this number if you have any problems the morning of surgery: [phone]  Remember: Do not eat or drink after midnight: Thursday Take these medicines the morning of surgery with a SIP OF WATER:  Letrozole, levothyroxine, metoprolol  Do not wear jewelry, make-up, or FINGER nail polish No metal in your hair or on your body. Do not wear lotions, powders, perfumes.  You may wear deodorant.  Do not bring valuables to the hospital. Contacts, dentures or bridgework may not be worn into surgery.  Leave suitcase in the car. After Surgery it may be brought to your room. For patients being admitted to the hospital, checkout time is 11:00am the day of discharge.   Home with husband Kim [name] or daughter Kim [name]

## 2015-01-06 ENCOUNTER — Other Ambulatory Visit: Payer: Self-pay

## 2015-01-06 ENCOUNTER — Telehealth: Payer: Self-pay | Admitting: Cardiology

## 2015-01-06 ENCOUNTER — Encounter (HOSPITAL_COMMUNITY): Payer: Self-pay

## 2015-01-06 DIAGNOSIS — Z01812 Encounter for preprocedural laboratory examination: Secondary | ICD-10-CM | POA: Diagnosis not present

## 2015-01-06 DIAGNOSIS — Z01810 Encounter for preprocedural cardiovascular examination: Secondary | ICD-10-CM | POA: Diagnosis not present

## 2015-01-06 LAB — COMPREHENSIVE METABOLIC PANEL
ALT: 13 U/L (ref 0–35)
AST: 18 U/L (ref 0–37)
Albumin: 4.2 g/dL (ref 3.5–5.2)
Alkaline Phosphatase: 53 U/L (ref 39–117)
Anion gap: 7 (ref 5–15)
BUN: 24 mg/dL — ABNORMAL HIGH (ref 6–23)
CO2: 26 mmol/L (ref 19–32)
Calcium: 9.3 mg/dL (ref 8.4–10.5)
Chloride: 106 mmol/L (ref 96–112)
Creatinine, Ser: 1.07 mg/dL (ref 0.50–1.10)
GFR calc Af Amer: 56 mL/min — ABNORMAL LOW (ref >90)
GFR calc non Af Amer: 48 mL/min — ABNORMAL LOW (ref >90)
Glucose, Bld: 82 mg/dL (ref 70–99)
Potassium: 3.8 mmol/L (ref 3.5–5.1)
Sodium: 139 mmol/L (ref 135–145)
Total Bilirubin: 0.6 mg/dL (ref 0.3–1.2)
Total Protein: 7 g/dL (ref 6.0–8.3)

## 2015-01-06 LAB — CBC
HCT: 33.5 % — ABNORMAL LOW (ref 36.0–46.0)
Hemoglobin: 10.5 g/dL — ABNORMAL LOW (ref 12.0–15.0)
MCH: 27.3 pg (ref 26.0–34.0)
MCHC: 31.3 g/dL (ref 30.0–36.0)
MCV: 87.2 fL (ref 78.0–100.0)
Platelets: 134 10*3/uL — ABNORMAL LOW (ref 150–400)
RBC: 3.84 MIL/uL — ABNORMAL LOW (ref 3.87–5.11)
RDW: 13.9 % (ref 11.5–15.5)
WBC: 3.9 10*3/uL — ABNORMAL LOW (ref 4.0–10.5)

## 2015-01-06 NOTE — Pre-Procedure Instructions (Signed)
Patient saw Dr Jillyn Hidden at today's PAT appt. [place] for surgery

## 2015-01-06 NOTE — Telephone Encounter (Signed)
Pt advised by anesthesia today to stop Plavix tomorrow prior to surgery 01/17/15 because they checked her blood today and told her it was too thin. Pt states she was told in Feb 2016 by Dr Aundra Dubin to take Plavix 75mg  two times a day until 5 days before surgery in April when she could discontinue and did not have to restart after surgery.  I do not see any notes that give her instructions to take Plavix two times a day. Pt states directions on her Plavix medication bottle state Plavix 75mg  take 1 tablet by mouth daily.  Pt wants to be sure it is  [place] with Dr Aundra Dubin to stop Plavix tomorrow prior to surgery 01/17/15.  Pt advised I will forward to Dr Aundra Dubin for review.

## 2015-01-06 NOTE — Telephone Encounter (Signed)
She can stop Plavix prior to surgery and may stay off it at this point.

## 2015-01-06 NOTE — Telephone Encounter (Signed)
New message     Talk to Lelon Frohlich today Pt having echo tomorrow.  Pt is having surgery 01-17-15.  She had her presurgical testing today.  Anesthesia said her bld was too thin.  Patient want to talk to [place] today

## 2015-01-06 NOTE — Anesthesia Preprocedure Evaluation (Addendum)
Anesthesia Evaluation  Patient identified by MRN, date of birth, ID band Patient awake    Reviewed: Allergy & Precautions, NPO status , Patient's Chart, lab work & pertinent test results  Airway Mallampati: II       Dental no notable dental hx.    Pulmonary    Pulmonary exam normal       Cardiovascular hypertension, Pt. on home beta blockers and Pt. on medications  Having echo tomorrow. Patient was planning to stop Plavix 5 days prior to procedure for which she wants to have spinal anesthesia. She is greater than one year out from DES. So by ACC/ASRA guidelines she will be instructed to discontinue Plavix 7 days prior to the procedure on 4/15.   Neuro/Psych    GI/Hepatic   Endo/Other    Renal/GU      Musculoskeletal   Abdominal Normal abdominal exam  (+)   Peds  Hematology   Anesthesia Other Findings   Reproductive/Obstetrics                            Anesthesia Physical Anesthesia Plan  ASA: III  Anesthesia Plan: Spinal   Post-op Pain Management:    Induction:   Airway Management Planned:   Additional Equipment:   Intra-op Plan:   Post-operative Plan:   Informed Consent: I have reviewed the patients History and Physical, chart, labs and discussed the procedure including the risks, benefits and alternatives for the proposed anesthesia with the patient or authorized representative who has indicated his/her understanding and acceptance.     Plan Discussed with: CRNA and Surgeon  Anesthesia Plan Comments:        Anesthesia Quick Evaluation

## 2015-01-06 NOTE — Pre-Procedure Instructions (Signed)
Per Dr. Jillyn Hidden, inform patient to stop plavix tomorrow, 01/07/15 after echo.  Patient is having a spinal anesthesia for her 01/17/15 surgery.  Patient called and verbalized understanding.

## 2015-01-07 ENCOUNTER — Encounter (HOSPITAL_COMMUNITY)
Admission: RE | Admit: 2015-01-07 | Discharge: 2015-01-07 | Disposition: A | Discharge status: Home / Self Care | Payer: Medicare Other | Source: Ambulatory Visit | Attending: Obstetrics & Gynecology | Admitting: Obstetrics & Gynecology

## 2015-01-07 ENCOUNTER — Ambulatory Visit (HOSPITAL_BASED_OUTPATIENT_CLINIC_OR_DEPARTMENT_OTHER): Payer: Medicare Other | Admitting: Radiology

## 2015-01-07 DIAGNOSIS — Z01812 Encounter for preprocedural laboratory examination: Secondary | ICD-10-CM | POA: Diagnosis not present

## 2015-01-07 DIAGNOSIS — I421 Obstructive hypertrophic cardiomyopathy: Secondary | ICD-10-CM

## 2015-01-07 DIAGNOSIS — Z01810 Encounter for preprocedural cardiovascular examination: Secondary | ICD-10-CM | POA: Insufficient documentation

## 2015-01-07 HISTORY — DX: Acute myocardial infarction, unspecified: I21.9

## 2015-01-07 NOTE — Progress Notes (Signed)
Echocardiogram performed.  

## 2015-01-07 NOTE — Telephone Encounter (Signed)
Pt aware of Dr Claris Gladden instructions about Plavix

## 2015-01-07 NOTE — Telephone Encounter (Signed)
LMTCB

## 2015-01-09 ENCOUNTER — Telehealth: Payer: Self-pay | Admitting: *Deleted

## 2015-01-09 NOTE — Telephone Encounter (Signed)
Notes Recorded by Larey Dresser, MD on 01/08/2015 at 5:02 PM EF normal, still shows signs of HOCM. Peak LVOT gradient 20 mmHg (stable)  LMTCB for pt

## 2015-01-10 NOTE — Telephone Encounter (Signed)
Follow Up  Pt returning Anne's phone call. Please call back and discuss.

## 2015-01-10 NOTE — Telephone Encounter (Signed)
Patient notified of test (ECHO) results. Discussed in detail. Patient verbalized understanding and agreement to continue with current treatment plan. Surgery planned for 4/15.

## 2015-01-15 ENCOUNTER — Other Ambulatory Visit: Payer: Medicare Other

## 2015-01-15 ENCOUNTER — Ambulatory Visit: Payer: Medicare Other | Admitting: Family

## 2015-01-15 ENCOUNTER — Ambulatory Visit: Payer: Medicare Other

## 2015-01-16 MED ORDER — GENTAMICIN SULFATE 40 MG/ML IJ SOLN
INTRAVENOUS | Status: AC
  Filled 2015-01-16: qty 9.75

## 2015-01-17 ENCOUNTER — Encounter (HOSPITAL_COMMUNITY): Payer: Self-pay | Admitting: *Deleted

## 2015-01-17 ENCOUNTER — Observation Stay (HOSPITAL_COMMUNITY)
Admission: RE | Admit: 2015-01-17 | Discharge: 2015-01-18 | Disposition: A | Discharge status: Home / Self Care | Payer: Medicare Other | Source: Ambulatory Visit | Attending: Obstetrics & Gynecology | Admitting: Obstetrics & Gynecology

## 2015-01-17 ENCOUNTER — Encounter (HOSPITAL_COMMUNITY)
Admission: RE | Disposition: A | Discharge status: Home / Self Care | Payer: Self-pay | Source: Ambulatory Visit | Attending: Obstetrics & Gynecology

## 2015-01-17 ENCOUNTER — Ambulatory Visit (HOSPITAL_COMMUNITY): Payer: Medicare Other | Admitting: Anesthesiology

## 2015-01-17 ENCOUNTER — Ambulatory Visit (HOSPITAL_COMMUNITY): Payer: Medicare Other

## 2015-01-17 DIAGNOSIS — I2510 Atherosclerotic heart disease of native coronary artery without angina pectoris: Secondary | ICD-10-CM | POA: Insufficient documentation

## 2015-01-17 DIAGNOSIS — E785 Hyperlipidemia, unspecified: Secondary | ICD-10-CM | POA: Insufficient documentation

## 2015-01-17 DIAGNOSIS — Z9841 Cataract extraction status, right eye: Secondary | ICD-10-CM | POA: Diagnosis not present

## 2015-01-17 DIAGNOSIS — N8110 Cystocele, unspecified: Secondary | ICD-10-CM | POA: Diagnosis not present

## 2015-01-17 DIAGNOSIS — Z880 Allergy status to penicillin: Secondary | ICD-10-CM | POA: Diagnosis not present

## 2015-01-17 DIAGNOSIS — Z419 Encounter for procedure for purposes other than remedying health state, unspecified: Secondary | ICD-10-CM

## 2015-01-17 DIAGNOSIS — E039 Hypothyroidism, unspecified: Secondary | ICD-10-CM | POA: Insufficient documentation

## 2015-01-17 DIAGNOSIS — Z961 Presence of intraocular lens: Secondary | ICD-10-CM | POA: Insufficient documentation

## 2015-01-17 DIAGNOSIS — I252 Old myocardial infarction: Secondary | ICD-10-CM | POA: Diagnosis not present

## 2015-01-17 DIAGNOSIS — Z7902 Long term (current) use of antithrombotics/antiplatelets: Secondary | ICD-10-CM | POA: Diagnosis not present

## 2015-01-17 DIAGNOSIS — Z9889 Other specified postprocedural states: Secondary | ICD-10-CM

## 2015-01-17 DIAGNOSIS — Z888 Allergy status to other drugs, medicaments and biological substances status: Secondary | ICD-10-CM | POA: Diagnosis not present

## 2015-01-17 DIAGNOSIS — Z86010 Personal history of colonic polyps: Secondary | ICD-10-CM | POA: Insufficient documentation

## 2015-01-17 DIAGNOSIS — Z90710 Acquired absence of both cervix and uterus: Secondary | ICD-10-CM | POA: Diagnosis not present

## 2015-01-17 DIAGNOSIS — I10 Essential (primary) hypertension: Secondary | ICD-10-CM | POA: Diagnosis not present

## 2015-01-17 DIAGNOSIS — Z9842 Cataract extraction status, left eye: Secondary | ICD-10-CM | POA: Diagnosis not present

## 2015-01-17 DIAGNOSIS — N816 Rectocele: Secondary | ICD-10-CM | POA: Insufficient documentation

## 2015-01-17 HISTORY — PX: ANTERIOR AND POSTERIOR REPAIR: SHX5121

## 2015-01-17 SURGERY — ANTERIOR (CYSTOCELE) AND POSTERIOR REPAIR (RECTOCELE)
Anesthesia: Spinal | Site: Spine Lumbar

## 2015-01-17 MED ORDER — MEPERIDINE HCL 25 MG/ML IJ SOLN: 6.2500 mg | INTRAMUSCULAR | Status: DC | PRN

## 2015-01-17 MED ORDER — LEVOTHYROXINE SODIUM 125 MCG PO TABS
125.0000 ug | ORAL_TABLET | Freq: Every day | ORAL | Status: DC
  Filled 2015-01-17 (×2): qty 1

## 2015-01-17 MED ORDER — FENTANYL CITRATE (PF) 100 MCG/2ML IJ SOLN
INTRAMUSCULAR | Status: DC | PRN
  Administered 2015-01-17 (×2): 25 ug via INTRAVENOUS

## 2015-01-17 MED ORDER — PROPOFOL INFUSION 10 MG/ML OPTIME
INTRAVENOUS | Status: DC | PRN
  Administered 2015-01-17: 25 ug/kg/min via INTRAVENOUS

## 2015-01-17 MED ORDER — LIDOCAINE HCL (CARDIAC) 20 MG/ML IV SOLN
INTRAVENOUS | Status: AC
  Filled 2015-01-17: qty 5

## 2015-01-17 MED ORDER — GENTAMICIN SULFATE 40 MG/ML IJ SOLN
INTRAVENOUS | Status: AC
  Administered 2015-01-17: 100 mL via INTRAVENOUS
  Filled 2015-01-17: qty 9.75

## 2015-01-17 MED ORDER — PROPOFOL 10 MG/ML IV BOLUS
INTRAVENOUS | Status: DC | PRN
  Administered 2015-01-17: 10 mg via INTRAVENOUS

## 2015-01-17 MED ORDER — ONDANSETRON HCL 4 MG/2ML IJ SOLN
INTRAMUSCULAR | Status: DC | PRN
  Administered 2015-01-17: 4 mg via INTRAVENOUS

## 2015-01-17 MED ORDER — IBUPROFEN 600 MG PO TABS
600.0000 mg | ORAL_TABLET | Freq: Four times a day (QID) | ORAL | Status: DC | PRN
  Administered 2015-01-17: 600 mg via ORAL
  Filled 2015-01-17 (×2): qty 1

## 2015-01-17 MED ORDER — ONDANSETRON HCL 4 MG/2ML IJ SOLN
INTRAMUSCULAR | Status: AC
  Filled 2015-01-17: qty 2

## 2015-01-17 MED ORDER — LIDOCAINE-EPINEPHRINE 1 %-1:100000 IJ SOLN
INTRAMUSCULAR | Status: AC
  Filled 2015-01-17: qty 1

## 2015-01-17 MED ORDER — 0.9 % SODIUM CHLORIDE (POUR BTL) OPTIME
TOPICAL | Status: DC | PRN
  Administered 2015-01-17: 1000 mL

## 2015-01-17 MED ORDER — LIDOCAINE-EPINEPHRINE 1 %-1:100000 IJ SOLN
INTRAMUSCULAR | Status: DC | PRN
  Administered 2015-01-17: 20 mL

## 2015-01-17 MED ORDER — PHENYLEPHRINE 8 MG IN D5W 100 ML (0.08MG/ML) PREMIX OPTIME
INJECTION | INTRAVENOUS | Status: DC | PRN
  Administered 2015-01-17: 60 ug/min via INTRAVENOUS

## 2015-01-17 MED ORDER — LACTATED RINGERS IV SOLN
INTRAVENOUS | Status: DC
  Administered 2015-01-17 – 2015-01-18 (×2): via INTRAVENOUS

## 2015-01-17 MED ORDER — BUPIVACAINE IN DEXTROSE 0.75-8.25 % IT SOLN
INTRATHECAL | Status: DC | PRN
  Administered 2015-01-17: 1.6 mL via INTRATHECAL

## 2015-01-17 MED ORDER — BUPIVACAINE HCL (PF) 0.25 % IJ SOLN
INTRAMUSCULAR | Status: AC
  Filled 2015-01-17: qty 30

## 2015-01-17 MED ORDER — DEXAMETHASONE SODIUM PHOSPHATE 4 MG/ML IJ SOLN
INTRAMUSCULAR | Status: AC
  Filled 2015-01-17: qty 1

## 2015-01-17 MED ORDER — LACTATED RINGERS IV SOLN
INTRAVENOUS | Status: DC
  Administered 2015-01-17: 07:00:00 via INTRAVENOUS

## 2015-01-17 MED ORDER — FENTANYL CITRATE (PF) 100 MCG/2ML IJ SOLN: 25.0000 ug | INTRAMUSCULAR | Status: DC | PRN

## 2015-01-17 MED ORDER — NITROGLYCERIN 0.4 MG SL SUBL
0.4000 mg | SUBLINGUAL_TABLET | SUBLINGUAL | Status: DC | PRN
  Filled 2015-01-17: qty 25

## 2015-01-17 MED ORDER — METOPROLOL SUCCINATE ER 50 MG PO TB24
50.0000 mg | ORAL_TABLET | Freq: Two times a day (BID) | ORAL | Status: DC
  Filled 2015-01-17 (×2): qty 1

## 2015-01-17 MED ORDER — FENTANYL CITRATE (PF) 100 MCG/2ML IJ SOLN
INTRAMUSCULAR | Status: AC
  Filled 2015-01-17: qty 2

## 2015-01-17 MED ORDER — ONDANSETRON HCL 4 MG/2ML IJ SOLN: 4.0000 mg | Freq: Once | INTRAMUSCULAR | Status: DC | PRN

## 2015-01-17 SURGICAL SUPPLY — 31 items
BLADE SURG 15 STRL LF C SS BP (BLADE) ×1 IMPLANT
BLADE SURG 15 STRL SS (BLADE) ×2
CLOTH BEACON ORANGE TIMEOUT ST (SAFETY) ×2 IMPLANT
CONT PATH 16OZ SNAP LID 3702 (MISCELLANEOUS) IMPLANT
DECANTER SPIKE VIAL GLASS SM (MISCELLANEOUS) IMPLANT
GLOVE BIO SURGEON STRL SZ 6.5 (GLOVE) ×4 IMPLANT
GLOVE BIOGEL PI IND STRL 7.0 (GLOVE) ×1 IMPLANT
GLOVE BIOGEL PI INDICATOR 7.0 (GLOVE) ×3
GOWN STRL REUS W/TWL LRG LVL3 (GOWN DISPOSABLE) ×8 IMPLANT
NDL SPNL 22GX3.5 QUINCKE BK (NEEDLE) IMPLANT
NEEDLE HYPO 22GX1.5 SAFETY (NEEDLE) ×2 IMPLANT
NEEDLE SPNL 22GX3.5 QUINCKE BK (NEEDLE) IMPLANT
PACK VAGINAL WOMENS (CUSTOM PROCEDURE TRAY) ×2 IMPLANT
SUT VIC AB 0 CT1 27 (SUTURE)
SUT VIC AB 0 CT1 27XBRD ANBCTR (SUTURE) IMPLANT
SUT VIC AB 2-0 CT1 27 (SUTURE)
SUT VIC AB 2-0 CT1 TAPERPNT 27 (SUTURE) IMPLANT
SUT VIC AB 2-0 CT2 27 (SUTURE) IMPLANT
SUT VIC AB 2-0 SH 27 (SUTURE) ×2
SUT VIC AB 2-0 SH 27XBRD (SUTURE) IMPLANT
SUT VIC AB 2-0 UR5 27 (SUTURE) ×2 IMPLANT
SUT VIC AB 3-0 SH 27 (SUTURE) ×2
SUT VIC AB 3-0 SH 27X BRD (SUTURE) IMPLANT
SUT VIC AB 4-0 SH 27 (SUTURE) ×2
SUT VIC AB 4-0 SH 27XANBCTRL (SUTURE) ×1 IMPLANT
SUT VICRYL 0 UR6 27IN ABS (SUTURE) ×1 IMPLANT
SUT VICRYL 2 0 18  UND BR (SUTURE)
SUT VICRYL 2 0 18 UND BR (SUTURE) IMPLANT
TOWEL OR 17X24 6PK STRL BLUE (TOWEL DISPOSABLE) ×4 IMPLANT
TRAY FOLEY CATH SILVER 14FR (SET/KITS/TRAYS/PACK) ×2 IMPLANT
WATER STERILE IRR 1000ML POUR (IV SOLUTION) ×4 IMPLANT

## 2015-01-17 NOTE — Anesthesia Procedure Notes (Signed)
Spinal Patient location during procedure: OR Start time: 01/17/2015 7:38 AM End time: 01/17/2015 7:42 AM Staffing Anesthesiologist: Lyn Hollingshead Performed by: anesthesiologist  Preanesthetic Checklist Completed: patient identified, surgical consent, pre-op evaluation, timeout performed, IV checked, risks and benefits discussed and monitors and equipment checked Spinal Block Patient position: sitting Prep: site prepped and draped and DuraPrep Patient monitoring: heart rate, cardiac monitor, continuous pulse ox and blood pressure Approach: midline Location: L2-3 Injection technique: single-shot Needle Needle type: Pencan  Needle gauge: 24 G Needle length: 9 cm Needle insertion depth: 5 cm Assessment Sensory level: T10

## 2015-01-17 NOTE — Anesthesia Postprocedure Evaluation (Signed)
Anesthesia Post Note  Patient: Kim [name]  Procedure(s) Performed: Procedure(s) (LRB): CYSTOCELE REPAIR  (N/A)  Anesthesia type: Spinal  Patient location: PACU  Post pain: Pain level controlled  Post assessment: Post-op Vital signs reviewed  Last Vitals:  Filed Vitals:   01/17/15 1226  BP: 139/57  Pulse: 61  Temp: 36.9 C  Resp: 22    Post vital signs: Reviewed  Level of consciousness: awake  Complications: No apparent anesthesia complications

## 2015-01-17 NOTE — Transfer of Care (Signed)
Immediate Anesthesia Transfer of Care Note  Patient: Kim [name]  Procedure(s) Performed: Procedure(s): CYSTOCELE REPAIR  (N/A)  Patient Location: PACU  Anesthesia Type:Spinal  Level of Consciousness: awake, alert  and oriented  Airway & Oxygen Therapy: Patient Spontanous Breathing and Patient connected to nasal cannula oxygen  Post-op Assessment: Report given to RN and Post -op Vital signs reviewed and stable  Post vital signs: Reviewed and stable  Last Vitals:  Filed Vitals:   01/17/15 0858  BP:   Pulse: 57  Temp: 36.5 C  Resp:     Complications: No apparent anesthesia complications

## 2015-01-17 NOTE — Op Note (Signed)
01/17/2015  8:52 AM  PATIENT:  Kim [name]  80 y.o. female  PRE-OPERATIVE DIAGNOSIS:  CYSTOCELE GRADE 4/4  POST-OPERATIVE DIAGNOSIS:  CYSTOCELE GRADE 4/4  PROCEDURE:  Procedure(s): CYSTOCELE REPAIR   SURGEON:  Surgeon(s): Princess Bruins, MD  ASSISTANTS: Artelia Laroche   ANESTHESIA:   spinal   PROCEDURE:  Under spinal anesthesia the patient is an lithotomy position. She is prepped with Betadine on the suprapubic, vulvar and vaginal areas. The Foley is inserted in the bladder. The patient is draped as usual.  2 Allises are put in place at the apex of the vagina.  The submucosal tissue is infiltrated on the anterior vagina with lidocaine 1% with epinephrine.  The scalpel is used to open the vaginal mucosa at the apex of the vagina.  We then used to strolly scissors to open a rounded excisional line from the apex of the vagina to about 2 cm of the urethral meatus.  As we go along Allises are put in place on either side of the open vaginal mucosa.  We then used the scalpel and the strolly scissors to release the fascia at the border of the vaginal mucosa.  With a sponge on a finger the bladder is then retracted down from the vaginal mucosa.  We then use a Vicryl 2-0 on the uro-needle to support the urethra going from side-to-side.  The cystocele is then reduced with separate stitches of Vicryl 2-0 reinforcing the fascia from side-to-side.  We then cut off the excess vaginal mucosa on either side  With straight scissors. A locked running suture of Vicryl 2-0 was used to close the vaginal mucosa with anchoring bites in the middle.  Hemostasis is adequate at all levels.  The cystocele is very well reduced.  A very small grade 1 over 3 asymptomatic rectocele was present which was not repaired.  The patient was brought to recovery room in good and stable status.  ESTIMATED BLOOD LOSS: 50 cc Urine: 150 cc, clear   Intake/Output Summary (Last 24 hours) at 01/17/15 0852 Last data filed at 01/17/15  0846  Gross per 24 hour  Intake    500 ml  Output    200 ml  Net    300 ml     BLOOD ADMINISTERED:none   LOCAL MEDICATIONS USED:  LIDOCAINE with EPINEPHRINE  SPECIMEN:  No Specimen  DISPOSITION OF SPECIMEN:  N/A  COUNTS:  YES  PLAN OF CARE: Transfer to PACU  Princess Bruins MD  01/17/2015 at 8:53 am

## 2015-01-17 NOTE — Anesthesia Postprocedure Evaluation (Signed)
  Anesthesia Post-op Note  Patient: Kim [name]  Procedure(s) Performed: Procedure(s): CYSTOCELE REPAIR  (N/A)  Patient Location: PACU  Anesthesia Type:Spinal  Level of Consciousness: awake and alert   Airway and Oxygen Therapy: Patient Spontanous Breathing  Post-op Pain: mild  Post-op Assessment: Post-op Vital signs reviewed, Patient's Cardiovascular Status Stable, Respiratory Function Stable, Patent Airway and No signs of Nausea or vomiting  Post-op Vital Signs: Reviewed  Last Vitals:  Filed Vitals:   01/17/15 1030  BP: 119/55  Pulse: 57  Temp:   Resp: 23    Complications: No apparent anesthesia complications

## 2015-01-17 NOTE — H&P (Signed)
Kim [name] is an 80 y.o. female G2P2  RP:  Sxic Cystocele grade 4/4 for Anterior repair  Pertinent Gynecological History   Blood transfusions: none Sexually transmitted diseases: no past history Last mammogram: normal  Last pap: normal OB History: G2P2   Menstrual History:  Patient's last menstrual period was 10/19/1991.    Past Medical History  Diagnosis Date  . Allergy   . Allergic rhinitis   . Hypothyroidism   . Urinary tract infection   . Diverticulosis   . Hx of adenomatous colonic polyps 02/1999  . Tubulovillous adenoma of colon 12/2009    with HGD  . Low back pain     gets ESI from Dr. Rennis Harding  . Hypertension   . HOH (hard of hearing)     bilateral, wears hearing aids  . Wears hearing aid     left and right   . Breast cancer of upper-outer quadrant of left female breast 11/08/13  . CAD (coronary artery disease)     a. 12/2013 Cath/PCI: LM nl, LAD 20p, 74m, 30d, D1 30p, LCX 20p, 81m, OM1 20, Mo2 40p, RCA 30p, 45m (4.0x16 Promus Premier DES), 20d, RPL 30, RPDA 70p, 44m, EF 65-70%.  . Hyperlipidemia   . S/P radiation therapy 05/14/2014-06/26/2014    1) Left Breast  / 50 Gy in 25 fractions, 2) Left Supraclavicular fossa/ 47.5 Gy in 25 fractions, 3) Left Posterior Axillary boost / 4.825 Gy in 25 fractions, 4) Left Breast boost / 10 Gy in 5 fractions   . SVD (spontaneous vaginal delivery)     x 2  . Myocardial infarction 12/2013    very mild  . Arthritis     hands    Past Surgical History  Procedure Laterality Date  . Abdominal hysterectomy    . Bilateral salpingoophorectomy      see Laurin Coder NP for GYN exams  . Cataract extraction w/ intraocular lens  implant, bilateral  2012    bilateral  . Colonoscopy  01-14-12    per Dr. Fuller Plan, adenomatous polyps, repeat in 3 years   . Breast lumpectomy with needle localization and axillary lymph node dissection Left 11/05/2013    Procedure: LEFT BREAST LUMPECTOMY WITH NEEDLE LOCALIZATION and axillary lymph Node Dissection;   Surgeon: Edward Jolly, MD;  Location: [place] [facility];  Service: General;  Laterality: Left;  . Portacath placement Right 12/11/2013    Procedure: INSERTION PORT-A-CATH;  Surgeon: Edward Jolly, MD;  Location: [facility];  Service: General;  Laterality: Right;  Subclavian Vein;   . Left heart catheterization with coronary angiogram N/A 12/19/2013    Procedure: LEFT HEART CATHETERIZATION WITH CORONARY ANGIOGRAM;  Surgeon: Burnell Blanks, MD;  Location: [facility] CATH LAB;  Service: Cardiovascular;  Laterality: N/A;  . Breast surgery  11/2013    left lumpectomy  . Lumbar epidural steroid injection      lumber spinal stenosis  . Eye surgery  11/22/2006    cataracts, bilateral, intraocular lens implant    Family History  Problem Relation Age of Onset  . Alcohol abuse Father   . Kidney failure Mother   . Colon cancer      family    Social History:  reports that she has never smoked. She has never used smokeless tobacco. She reports that she does not drink alcohol or use illicit drugs.  Allergies:  Allergies  Allergen Reactions  . Propoxyphene N-Acetaminophen Swelling    tongue swelling  . Penicillins Swelling  Facial swelling  . Cortisone Rash  . Myrbetriq [Mirabegron]     Rash on face  . Pseudoephedrine Hcl Er Rash and Other (See Comments)    Face gets red    Prescriptions prior to admission  Medication Sig Dispense Refill Last Dose  . acetaminophen (TYLENOL) 650 MG CR tablet Take 650 mg by mouth every 8 (eight) hours as needed for pain.    Past Month at Unknown time  . Ascorbic Acid (VITAMIN C) 1000 MG tablet Take 1,000 mg by mouth daily.   Past Month at Unknown time  . aspirin EC 81 MG EC tablet Take 1 tablet (81 mg total) by mouth daily.   Past Month at Unknown time  . Calcium Citrate-Vitamin D (CITRACAL MAXIMUM PO) Take 1 tablet by mouth daily.    Past Month at Unknown time  . Cyanocobalamin (VITAMIN B-12) 2500 MCG SUBL Place under the  tongue as needed.   Past Month at Unknown time  . letrozole (FEMARA) 2.5 MG tablet Take 1 tablet (2.5 mg total) by mouth daily. (Patient taking differently: Take 2.5 mg by mouth every morning. ) 30 tablet 5 01/17/2015 at Unknown time  . levothyroxine (SYNTHROID, LEVOTHROID) 125 MCG tablet Take 1 tablet (125 mcg total) by mouth daily before breakfast. 90 tablet 3 01/17/2015 at Unknown time  . metoprolol succinate (TOPROL XL) 50 MG 24 hr tablet Take 1 tablet (50 mg total) by mouth 2 (two) times daily. Take with or immediately following a meal. 60 tablet 6 01/17/2015 at Unknown time  . Multiple Vitamins-Minerals (CENTRUM ADULTS PO) Take by mouth.   Past Month at Unknown time  . acidophilus (RISAQUAD) CAPS capsule Take 1 capsule by mouth daily.   More than a month at Unknown time  . cetirizine (ZYRTEC) 10 MG tablet Take 10 mg by mouth daily as needed for allergies.   More than a month at Unknown time  . clopidogrel (PLAVIX) 75 MG tablet Take 1 tablet (75 mg total) by mouth daily. (Patient taking differently: Take 75 mg by mouth daily. ) 90 tablet 2 01/03/2015  . CRESTOR 10 MG tablet TAKE 1 TABLET BY MOUTH ONCE A DAY (Patient taking differently: TAKE 1 TABLET BY MOUTH ONCE A DAY at night) 30 tablet 4 More than a month at Unknown time  . doxycycline (VIBRAMYCIN) 100 MG capsule Take 100 mg by mouth 2 (two) times daily.   Unknown at Unknown time  . furosemide (LASIX) 20 MG tablet Take 1 tablet (20 mg total) by mouth daily. Take 1 tablet (20 mg total) every day x 3 days then take once daily as needed (Patient taking differently: Take 20 mg by mouth at bedtime. Take 1 tablet (20 mg total) every day x 3 days then take once daily as needed) 30 tablet 0 Unknown at Unknown time  . lidocaine-prilocaine (EMLA) cream Apply topically as needed. 30 g 6 More than a month at Unknown time  . Loperamide HCl (IMODIUM PO) Take by mouth as needed.   More than a month at Unknown time  . nitroGLYCERIN (NITROSTAT) 0.4 MG SL tablet Place  1 tablet (0.4 mg total) under the tongue every 5 (five) minutes x 3 doses as needed for chest pain. 25 tablet 3 Unknown at Unknown time    ROS  Blood pressure 132/74, pulse 64, temperature 98.1 F (36.7 C), temperature source Oral, resp. rate 18, last menstrual period 10/19/1991, SpO2 97 %. Physical Exam   Cystocele grade 4/4 S/P Hysterectomy/BSO  No results found  for this or any previous visit (from the past 24 hour(s)).  No results found.  Assessment/Plan: Sxic Cystocele grade 4/4 for Anterior Repair under Spinal.  Cardio clearance obtained.  Surgery and risks reviewed.  Lavar Rosenzweig,MARIE-LYNE 01/17/2015, 7:05 AM

## 2015-01-18 DIAGNOSIS — N8110 Cystocele, unspecified: Secondary | ICD-10-CM | POA: Diagnosis not present

## 2015-01-18 LAB — CBC
HCT: 27.9 % — ABNORMAL LOW (ref 36.0–46.0)
Hemoglobin: 9 g/dL — ABNORMAL LOW (ref 12.0–15.0)
MCH: 27.4 pg (ref 26.0–34.0)
MCHC: 32.3 g/dL (ref 30.0–36.0)
MCV: 84.8 fL (ref 78.0–100.0)
Platelets: 106 10*3/uL — ABNORMAL LOW (ref 150–400)
RBC: 3.29 MIL/uL — ABNORMAL LOW (ref 3.87–5.11)
RDW: 13.8 % (ref 11.5–15.5)
WBC: 4 10*3/uL (ref 4.0–10.5)

## 2015-01-18 NOTE — Discharge Instructions (Signed)
Cystocele Repair, Care After Refer to this sheet in the next few weeks. These instructions provide you with information on caring for yourself after your procedure. Your health care provider may also give you more specific instructions. Your treatment has been planned according to current medical practices, but problems sometimes occur. Call your health care provider if you have any problems or questions after your procedure. WHAT TO EXPECT AFTER THE PROCEDURE After your procedure, it is typical to have the following:  Bloody discharge from the vagina for 1-2 weeks.  A catheter in your bladder to drain urine as the bladder heals. You will be instructed on how to empty the bag. HOME CARE INSTRUCTIONS   Only take over-the-counter or prescription medicines as directed by your health care provider.  Do not take baths. Take showers until your health care provider tells you otherwise.  Exercise as instructed. Do not perform any exercise that increases the pressure on your abdomen, such as lifting weights or doing sit-ups, until your health care provider has given you permission.  You may resume your normal diet. Eat a well-balanced diet.  Drink enough fluids to keep your urine clear or pale yellow.  Avoid straining during bowel movements. If you become constipated, you may:  Take a mild laxative if your health care provider approves.  Add fruit and bran to your diet.  Drink more fluids.  Do not douche or have sexual intercourse for 6 weeks after your surgery.  Follow up with your health care provider as directed. SEEK MEDICAL CARE IF:  You have nausea or vomiting.  You have vaginal pain that is not relieved by pain medicines.  You feel a burning sensation during urination or have frequent urination.  SEEK IMMEDIATE MEDICAL CARE IF:   You have redness, swelling, or a bad-smelling discharge from the vagina.  You notice a bad smell coming from the vagina.  You have pus coming from  the vagina.  You have a fever.  You have abdominal pain.  You have excessive vaginal bleeding.  You have shortness of breath or chest pain. MAKE SURE YOU:  Understand these instructions.  Will watch your condition.  Will get help right away if you are not doing well or get worse. Document Released: 04/09/2005 Document Revised: 09/25/2013 Document Reviewed: 03/09/2013 [facility] Patient Information 2015 [place], [place]. This information is not intended to replace advice given to you by your health care provider. Make sure you discuss any questions you have with your health care provider.

## 2015-01-18 NOTE — Progress Notes (Signed)
UR completed 

## 2015-01-18 NOTE — Progress Notes (Signed)
Discharge teaching complete. Pt understood all instructions and did not have any questions. Pt ambulated out of the hospital and discharged home to family.

## 2015-01-18 NOTE — Discharge Summary (Signed)
  Physician Discharge Summary  Patient ID: Kim [name] MRN: [id number] DOB/AGE: 1935/03/13 80 y.o.  Admit date: 01/17/2015 Discharge date: 01/18/2015  Admission Diagnoses: PROLAPSE AND CYSTOCELE  Discharge Diagnoses: PROLAPSE AND CYSTOCELE        Active Problems:   Postoperative state   Discharged Condition: good  Hospital Course: good  Consults: None  Treatments: surgery: Cystocele repair  Disposition: 01-Home or Self Care     Medication List    TAKE these medications        acetaminophen 650 MG CR tablet  Commonly known as:  TYLENOL  Take 650 mg by mouth every 8 (eight) hours as needed for pain.     acidophilus Caps capsule  Take 1 capsule by mouth daily.     aspirin 81 MG EC tablet  Take 1 tablet (81 mg total) by mouth daily.     CENTRUM ADULTS PO  Take by mouth.     cetirizine 10 MG tablet  Commonly known as:  ZYRTEC  Take 10 mg by mouth daily as needed for allergies.     CITRACAL MAXIMUM PO  Take 1 tablet by mouth daily.     clopidogrel 75 MG tablet  Commonly known as:  PLAVIX  Take 1 tablet (75 mg total) by mouth daily.     CRESTOR 10 MG tablet  Generic drug:  rosuvastatin  TAKE 1 TABLET BY MOUTH ONCE A DAY     doxycycline 100 MG capsule  Commonly known as:  VIBRAMYCIN  Take 100 mg by mouth 2 (two) times daily.     furosemide 20 MG tablet  Commonly known as:  LASIX  Take 1 tablet (20 mg total) by mouth daily. Take 1 tablet (20 mg total) every day x 3 days then take once daily as needed     IMODIUM PO  Take by mouth as needed.     letrozole 2.5 MG tablet  Commonly known as:  FEMARA  Take 1 tablet (2.5 mg total) by mouth daily.     levothyroxine 125 MCG tablet  Commonly known as:  SYNTHROID, LEVOTHROID  Take 1 tablet (125 mcg total) by mouth daily before breakfast.     lidocaine-prilocaine cream  Commonly known as:  EMLA  Apply topically as needed.     metoprolol succinate 50 MG 24 hr tablet  Commonly known as:  TOPROL XL  Take  1 tablet (50 mg total) by mouth 2 (two) times daily. Take with or immediately following a meal.     nitroGLYCERIN 0.4 MG SL tablet  Commonly known as:  NITROSTAT  Place 1 tablet (0.4 mg total) under the tongue every 5 (five) minutes x 3 doses as needed for chest pain.     Vitamin B-12 2500 MCG Subl  Place under the tongue as needed.     vitamin C 1000 MG tablet  Take 1,000 mg by mouth daily.           Follow-up Information    Follow up with Sumiye Hirth,MARIE-LYNE, MD In 3 weeks.   Specialty:  Obstetrics and Gynecology   Contact information:   [place] [address] [phone]       Signed: Princess Bruins, MD 01/18/2015, 10:45 AM

## 2015-01-18 NOTE — Progress Notes (Signed)
1 Day Post-Op Procedure(s) (LRB): CYSTOCELE REPAIR  (N/A)  Subjective: Patient reports that pain is well managed.  Tolerating normal diet as tolerated  diet without difficulty. No nausea / vomiting.  Ambulating and voiding.  Objective: BP 106/56 mmHg  Pulse 78  Temp(Src) 99 F (37.2 C) (Oral)  Resp 18  Ht 5\' 8"  (1.727 m)  Wt 170 lb (77.111 kg)  BMI 25.85 kg/m2  SpO2 98%  LMP 10/19/1991 Lungs: clear Heart: normal rate and rhythm Abdomen:soft and appropriately tender Extremities: Homans sign is negative, no sign of DVT No vaginal bleeding  Post op Hb 9.0 WBC 4.0  Assessment: s/p Procedure(s): CYSTOCELE REPAIR : progressing well  Plan: Discharge home     [place] 01/18/2015, 10:42 AM

## 2015-01-20 ENCOUNTER — Encounter (HOSPITAL_COMMUNITY): Payer: Self-pay | Admitting: Obstetrics & Gynecology

## 2015-01-30 ENCOUNTER — Ambulatory Visit (HOSPITAL_BASED_OUTPATIENT_CLINIC_OR_DEPARTMENT_OTHER): Payer: Medicare Other | Admitting: Hematology & Oncology

## 2015-01-30 ENCOUNTER — Encounter: Payer: Self-pay | Admitting: Hematology & Oncology

## 2015-01-30 ENCOUNTER — Telehealth: Payer: Self-pay | Admitting: Hematology & Oncology

## 2015-01-30 ENCOUNTER — Ambulatory Visit (HOSPITAL_BASED_OUTPATIENT_CLINIC_OR_DEPARTMENT_OTHER): Payer: Medicare Other

## 2015-01-30 ENCOUNTER — Other Ambulatory Visit (HOSPITAL_BASED_OUTPATIENT_CLINIC_OR_DEPARTMENT_OTHER): Payer: Medicare Other

## 2015-01-30 VITALS — BP 121/60 | HR 80 | Temp 97.5°F | Resp 16 | Ht 68.0 in | Wt 170.0 lb

## 2015-01-30 DIAGNOSIS — C50412 Malignant neoplasm of upper-outer quadrant of left female breast: Secondary | ICD-10-CM

## 2015-01-30 DIAGNOSIS — C50919 Malignant neoplasm of unspecified site of unspecified female breast: Secondary | ICD-10-CM

## 2015-01-30 DIAGNOSIS — C773 Secondary and unspecified malignant neoplasm of axilla and upper limb lymph nodes: Secondary | ICD-10-CM

## 2015-01-30 DIAGNOSIS — D0512 Intraductal carcinoma in situ of left breast: Secondary | ICD-10-CM | POA: Diagnosis not present

## 2015-01-30 DIAGNOSIS — Z5112 Encounter for antineoplastic immunotherapy: Secondary | ICD-10-CM | POA: Diagnosis not present

## 2015-01-30 DIAGNOSIS — C50912 Malignant neoplasm of unspecified site of left female breast: Secondary | ICD-10-CM

## 2015-01-30 DIAGNOSIS — I2510 Atherosclerotic heart disease of native coronary artery without angina pectoris: Secondary | ICD-10-CM

## 2015-01-30 LAB — CMP (CANCER CENTER ONLY)
ALT(SGPT): 16 U/L (ref 10–47)
AST: 21 U/L (ref 11–38)
Albumin: 3.7 g/dL (ref 3.3–5.5)
Alkaline Phosphatase: 43 U/L (ref 26–84)
BUN, Bld: 25 mg/dL — ABNORMAL HIGH (ref 7–22)
CO2: 28 mEq/L (ref 18–33)
Calcium: 9.1 mg/dL (ref 8.0–10.3)
Chloride: 106 mEq/L (ref 98–108)
Creat: 1.2 mg/dl (ref 0.6–1.2)
Glucose, Bld: 146 mg/dL — ABNORMAL HIGH (ref 73–118)
Potassium: 3.8 mEq/L (ref 3.3–4.7)
Sodium: 140 mEq/L (ref 128–145)
Total Bilirubin: 0.8 mg/dl (ref 0.20–1.60)
Total Protein: 7 g/dL (ref 6.4–8.1)

## 2015-01-30 LAB — CBC WITH DIFFERENTIAL (CANCER CENTER ONLY)
BASO#: 0 10*3/uL (ref 0.0–0.2)
BASO%: 0.6 % (ref 0.0–2.0)
EOS%: 3.9 % (ref 0.0–7.0)
Eosinophils Absolute: 0.1 10*3/uL (ref 0.0–0.5)
HCT: 32.1 % — ABNORMAL LOW (ref 34.8–46.6)
HGB: 10.5 g/dL — ABNORMAL LOW (ref 11.6–15.9)
LYMPH#: 0.7 10*3/uL — ABNORMAL LOW (ref 0.9–3.3)
LYMPH%: 20.2 % (ref 14.0–48.0)
MCH: 28.1 pg (ref 26.0–34.0)
MCHC: 32.7 g/dL (ref 32.0–36.0)
MCV: 86 fL (ref 81–101)
MONO#: 0.3 10*3/uL (ref 0.1–0.9)
MONO%: 8 % (ref 0.0–13.0)
NEUT#: 2.4 10*3/uL (ref 1.5–6.5)
NEUT%: 67.3 % (ref 39.6–80.0)
Platelets: 145 10*3/uL (ref 145–400)
RBC: 3.74 10*6/uL (ref 3.70–5.32)
RDW: 13.8 % (ref 11.1–15.7)
WBC: 3.6 10*3/uL — ABNORMAL LOW (ref 3.9–10.0)

## 2015-01-30 MED ORDER — ACETAMINOPHEN 325 MG PO TABS
650.0000 mg | ORAL_TABLET | Freq: Once | ORAL | Status: AC
  Administered 2015-01-30: 650 mg via ORAL

## 2015-01-30 MED ORDER — DIPHENHYDRAMINE HCL 25 MG PO CAPS
ORAL_CAPSULE | ORAL | Status: AC
  Filled 2015-01-30: qty 2

## 2015-01-30 MED ORDER — TRASTUZUMAB CHEMO INJECTION 440 MG
6.0000 mg/kg | Freq: Once | INTRAVENOUS | Status: AC
  Administered 2015-01-30: 462 mg via INTRAVENOUS
  Filled 2015-01-30: qty 22

## 2015-01-30 MED ORDER — ALTEPLASE 2 MG IJ SOLR
2.0000 mg | Freq: Once | INTRAMUSCULAR | Status: DC | PRN
  Filled 2015-01-30: qty 2

## 2015-01-30 MED ORDER — SODIUM CHLORIDE 0.9 % IV SOLN
Freq: Once | INTRAVENOUS | Status: AC
  Administered 2015-01-30: 11:00:00 via INTRAVENOUS

## 2015-01-30 MED ORDER — DIPHENHYDRAMINE HCL 25 MG PO CAPS
50.0000 mg | ORAL_CAPSULE | Freq: Once | ORAL | Status: AC
  Administered 2015-01-30: 50 mg via ORAL

## 2015-01-30 MED ORDER — HEPARIN SOD (PORK) LOCK FLUSH 100 UNIT/ML IV SOLN
500.0000 [IU] | Freq: Once | INTRAVENOUS | Status: AC | PRN
  Administered 2015-01-30: 500 [IU]
  Filled 2015-01-30: qty 5

## 2015-01-30 MED ORDER — SODIUM CHLORIDE 0.9 % IJ SOLN
10.0000 mL | INTRAMUSCULAR | Status: DC | PRN
  Administered 2015-01-30: 10 mL
  Filled 2015-01-30: qty 10

## 2015-01-30 MED ORDER — ACETAMINOPHEN 325 MG PO TABS
ORAL_TABLET | ORAL | Status: AC
  Filled 2015-01-30: qty 2

## 2015-01-30 MED ORDER — SODIUM CHLORIDE 0.9 % IJ SOLN
10.0000 mL | INTRAMUSCULAR | Status: DC | PRN
  Filled 2015-01-30: qty 10

## 2015-01-30 MED ORDER — ZOLEDRONIC ACID 4 MG/5ML IV CONC: 4.0000 mg | Freq: Once | INTRAVENOUS | Status: DC

## 2015-01-30 NOTE — Patient Instructions (Addendum)
[facility] Discharge Instructions for Patients Receiving Chemotherapy  Today you received the following chemotherapy agents Herceptin  To help prevent nausea and vomiting after your treatment, we encourage you to take your nausea medication    If you develop nausea and vomiting that is not controlled by your nausea medication, call the clinic.   BELOW ARE SYMPTOMS THAT SHOULD BE REPORTED IMMEDIATELY:  *FEVER GREATER THAN 100.5 F  *CHILLS WITH OR WITHOUT FEVER  NAUSEA AND VOMITING THAT IS NOT CONTROLLED WITH YOUR NAUSEA MEDICATION  *UNUSUAL SHORTNESS OF BREATH  *UNUSUAL BRUISING OR BLEEDING  TENDERNESS IN MOUTH AND THROAT WITH OR WITHOUT PRESENCE OF ULCERS  *URINARY PROBLEMS  *BOWEL PROBLEMS  UNUSUAL RASH Items with * indicate a potential emergency and should be followed up as soon as possible.  Feel free to call the clinic you have any questions or concerns. The clinic phone number is [phone].  Please show the CHEMO ALERT CARD at check-in to the Emergency Department and triage nurse.   

## 2015-01-30 NOTE — Progress Notes (Signed)
Hematology and Oncology Follow Up Visit  Kim [name] [id number] Dec 11, 1934 80 y.o. 01/30/2015   Principle Diagnosis:  Stage IIA (T1N1M0) adenocarcinoma of the left breast-triple positive  Current Therapy:   Status post cycle 4 of Abraxane/Herceptin Maintenance Herceptin q 3wk Zometa 4 mg IV every 6 months Femara 2.5 mg by mouth daily      Interim History:  [name] is back for followup. She is doing pretty well. She had a bladder procedure a few weeks ago. She had a bladder tack procedure. This went quite well. She had a spinal anesthesia. She had no calm occasions.  Otherwise, she's had no problems. She's had no cardiac issues. There's been no cough or shortness of breath. She's had no fatigue.  There's been no fever.  She's had no rashes.  Shoes will be going to the beach for a couple weeks. We will adjust her chemotherapy schedule.   She has a performance status of ECOG 1.    Medications:  Current outpatient prescriptions:  .  acetaminophen (TYLENOL) 650 MG CR tablet, Take 650 mg by mouth every 8 (eight) hours as needed for pain. , Disp: , Rfl:  .  acidophilus (RISAQUAD) CAPS capsule, Take 1 capsule by mouth daily., Disp: , Rfl:  .  Ascorbic Acid (VITAMIN C) 1000 MG tablet, Take 1,000 mg by mouth daily., Disp: , Rfl:  .  aspirin EC 81 MG EC tablet, Take 1 tablet (81 mg total) by mouth daily., Disp: , Rfl:  .  Calcium Citrate-Vitamin D (CITRACAL MAXIMUM PO), Take 1 tablet by mouth daily. , Disp: , Rfl:  .  cetirizine (ZYRTEC) 10 MG tablet, Take 10 mg by mouth daily as needed for allergies., Disp: , Rfl:  .  CRESTOR 10 MG tablet, TAKE 1 TABLET BY MOUTH ONCE A DAY (Patient taking differently: TAKE 1 TABLET BY MOUTH ONCE A DAY at night), Disp: 30 tablet, Rfl: 4 .  Cyanocobalamin (VITAMIN B-12) 2500 MCG SUBL, Place under the tongue as needed., Disp: , Rfl:  .  furosemide (LASIX) 20 MG tablet, Take 1 tablet (20 mg total) by mouth daily. Take 1 tablet (20 mg total) every day  x 3 days then take once daily as needed (Patient taking differently: Take 20 mg by mouth at bedtime. Take 1 tablet (20 mg total) every day x 3 days then take once daily as needed), Disp: 30 tablet, Rfl: 0 .  letrozole (FEMARA) 2.5 MG tablet, Take 1 tablet (2.5 mg total) by mouth daily. (Patient taking differently: Take 2.5 mg by mouth every morning. ), Disp: 30 tablet, Rfl: 5 .  levothyroxine (SYNTHROID, LEVOTHROID) 125 MCG tablet, Take 1 tablet (125 mcg total) by mouth daily before breakfast., Disp: 90 tablet, Rfl: 3 .  lidocaine-prilocaine (EMLA) cream, Apply topically as needed., Disp: 30 g, Rfl: 6 .  Loperamide HCl (IMODIUM PO), Take by mouth as needed., Disp: , Rfl:  .  metoprolol succinate (TOPROL XL) 50 MG 24 hr tablet, Take 1 tablet (50 mg total) by mouth 2 (two) times daily. Take with or immediately following a meal., Disp: 60 tablet, Rfl: 6 .  Multiple Vitamins-Minerals (CENTRUM ADULTS PO), Take by mouth., Disp: , Rfl:  .  nitroGLYCERIN (NITROSTAT) 0.4 MG SL tablet, Place 1 tablet (0.4 mg total) under the tongue every 5 (five) minutes x 3 doses as needed for chest pain., Disp: 25 tablet, Rfl: 3 .  [DISCONTINUED] ferrous sulfate 325 (65 FE) MG tablet, Take 325 mg by mouth 2 (two) times  daily.  , Disp: , Rfl:   Allergies:  Allergies  Allergen Reactions  . Propoxyphene N-Acetaminophen Swelling    tongue swelling  . Penicillins Swelling    Facial swelling  . Cortisone Rash  . Myrbetriq [Mirabegron]     Rash on face  . Pseudoephedrine Hcl Er Rash and Other (See Comments)    Face gets red    Past Medical History, Surgical history, Social history, and Family History were reviewed and updated.  Review of Systems: As above  Physical Exam:  height is 5\' 8"  (1.727 m) and weight is 170 lb (77.111 kg). Her oral temperature is 97.5 F (36.4 C). Her blood pressure is 121/60 and her pulse is 80. Her respiration is 16.   Well-developed and well-nourished white female. Her head and neck exam  shows no ocular or oral lesions. She has no palpable cervical or supraclavicular lymph nodes. Lungs are clear bilaterally. Cardiac exam regular rate and rhythm with a 2/6 systolic ejection murmur. Breast exam shows right breast with no masses edema or erythema. There is no right axillary adenopathy. Left breast shows well-healed lumpectomy at the 2:00 position. There might be a little bit of erythema. The might be a little bit of swelling. There is no tenderness at the lumpectomy site. There is no masses in the left breast. There is no left axillary adenopathy.. Abdomen is soft. She's good bowel sounds. There is no palpable liver or spleen tip. Back exam shows no tenderness over the spine ribs or hips. Extremities shows no clubbing cyanosis or edema. Neurological exam shows no focal neurological deficits.  Lab Results  Component Value Date   WBC 3.6* 01/30/2015   HGB 10.5* 01/30/2015   HCT 32.1* 01/30/2015   MCV 86 01/30/2015   PLT 145 01/30/2015     Chemistry      Component Value Date/Time   NA 139 01/06/2015 1045   NA 143 12/26/2014 0851   NA 137 01/28/2014 1354   K 3.8 01/06/2015 1045   K 4.1 12/26/2014 0851   K 4.3 01/28/2014 1354   CL 106 01/06/2015 1045   CL 101 12/26/2014 0851   CO2 26 01/06/2015 1045   CO2 28 12/26/2014 0851   CO2 21* 01/28/2014 1354   BUN 24* 01/06/2015 1045   BUN 21 12/26/2014 0851   BUN 35.3* 01/28/2014 1354   CREATININE 1.07 01/06/2015 1045   CREATININE 1.2 12/26/2014 0851   CREATININE 1.3* 01/28/2014 1354      Component Value Date/Time   CALCIUM 9.3 01/06/2015 1045   CALCIUM 9.3 12/26/2014 0851   CALCIUM 9.4 01/28/2014 1354   ALKPHOS 53 01/06/2015 1045   ALKPHOS 45 12/26/2014 0851   ALKPHOS 63 01/28/2014 1354   AST 18 01/06/2015 1045   AST 18 12/26/2014 0851   AST 10 01/28/2014 1354   ALT 13 01/06/2015 1045   ALT 16 12/26/2014 0851   ALT 13 01/28/2014 1354   BILITOT 0.6 01/06/2015 1045   BILITOT 0.80 12/26/2014 0851   BILITOT 0.76 01/28/2014  1354         Impression and Plan: Kim [name] is 80 year old white female with stage IIA ductal carcinoma of the left breast. She had 4 positive lymph nodes. Her breast cancer was triple positive. She completed her adjuvant chemotherapy. She completed this in July of 2015.  We will finish up the Herceptin in June.  Again, I did not see any problems with her having her bladder surgery in April.   Her echocardiogram done  in April showed a very good ejection fraction of 55-60%. This also is quite encouraging.  We'll plan to get her back in 6 more weeks.  She is on Femara. She is doing okay with the Femara.  I spent about 25-30 minutes with her.  [name], MD 4/28/201610:27 AM

## 2015-01-30 NOTE — Telephone Encounter (Signed)
Pt moved 5-19 to 5-31 RN aware to let MD know pt moved tx out 2 weeks

## 2015-02-20 ENCOUNTER — Ambulatory Visit: Payer: Medicare Other

## 2015-02-20 ENCOUNTER — Other Ambulatory Visit: Payer: Medicare Other

## 2015-02-20 ENCOUNTER — Ambulatory Visit: Payer: Medicare Other | Admitting: Family

## 2015-03-04 ENCOUNTER — Other Ambulatory Visit (HOSPITAL_BASED_OUTPATIENT_CLINIC_OR_DEPARTMENT_OTHER): Payer: Medicare Other

## 2015-03-04 ENCOUNTER — Encounter: Payer: Self-pay | Admitting: Family

## 2015-03-04 ENCOUNTER — Ambulatory Visit (HOSPITAL_BASED_OUTPATIENT_CLINIC_OR_DEPARTMENT_OTHER): Payer: Medicare Other

## 2015-03-04 ENCOUNTER — Ambulatory Visit (HOSPITAL_BASED_OUTPATIENT_CLINIC_OR_DEPARTMENT_OTHER): Payer: Medicare Other | Admitting: Family

## 2015-03-04 VITALS — BP 136/51 | HR 78 | Temp 98.0°F | Resp 16 | Wt 171.0 lb

## 2015-03-04 DIAGNOSIS — C773 Secondary and unspecified malignant neoplasm of axilla and upper limb lymph nodes: Secondary | ICD-10-CM

## 2015-03-04 DIAGNOSIS — Z5112 Encounter for antineoplastic immunotherapy: Secondary | ICD-10-CM | POA: Diagnosis not present

## 2015-03-04 DIAGNOSIS — C50919 Malignant neoplasm of unspecified site of unspecified female breast: Secondary | ICD-10-CM

## 2015-03-04 DIAGNOSIS — C50412 Malignant neoplasm of upper-outer quadrant of left female breast: Secondary | ICD-10-CM

## 2015-03-04 DIAGNOSIS — Z170 Estrogen receptor positive status [ER+]: Secondary | ICD-10-CM | POA: Diagnosis not present

## 2015-03-04 DIAGNOSIS — D0512 Intraductal carcinoma in situ of left breast: Secondary | ICD-10-CM

## 2015-03-04 DIAGNOSIS — C50912 Malignant neoplasm of unspecified site of left female breast: Secondary | ICD-10-CM

## 2015-03-04 LAB — CBC WITH DIFFERENTIAL (CANCER CENTER ONLY)
BASO#: 0 10*3/uL (ref 0.0–0.2)
BASO%: 0.8 % (ref 0.0–2.0)
EOS%: 2.3 % (ref 0.0–7.0)
Eosinophils Absolute: 0.1 10*3/uL (ref 0.0–0.5)
HCT: 32.4 % — ABNORMAL LOW (ref 34.8–46.6)
HGB: 10.6 g/dL — ABNORMAL LOW (ref 11.6–15.9)
LYMPH#: 0.8 10*3/uL — ABNORMAL LOW (ref 0.9–3.3)
LYMPH%: 19.9 % (ref 14.0–48.0)
MCH: 28.1 pg (ref 26.0–34.0)
MCHC: 32.7 g/dL (ref 32.0–36.0)
MCV: 86 fL (ref 81–101)
MONO#: 0.4 10*3/uL (ref 0.1–0.9)
MONO%: 9 % (ref 0.0–13.0)
NEUT#: 2.7 10*3/uL (ref 1.5–6.5)
NEUT%: 68 % (ref 39.6–80.0)
Platelets: 137 10*3/uL — ABNORMAL LOW (ref 145–400)
RBC: 3.77 10*6/uL (ref 3.70–5.32)
RDW: 14.7 % (ref 11.1–15.7)
WBC: 3.9 10*3/uL (ref 3.9–10.0)

## 2015-03-04 LAB — CMP (CANCER CENTER ONLY)
ALT(SGPT): 18 U/L (ref 10–47)
AST: 20 U/L (ref 11–38)
Albumin: 3.8 g/dL (ref 3.3–5.5)
Alkaline Phosphatase: 51 U/L (ref 26–84)
BUN, Bld: 19 mg/dL (ref 7–22)
CO2: 26 mEq/L (ref 18–33)
Calcium: 9.4 mg/dL (ref 8.0–10.3)
Chloride: 107 mEq/L (ref 98–108)
Creat: 1.2 mg/dl (ref 0.6–1.2)
Glucose, Bld: 143 mg/dL — ABNORMAL HIGH (ref 73–118)
Potassium: 3.6 mEq/L (ref 3.3–4.7)
Sodium: 139 mEq/L (ref 128–145)
Total Bilirubin: 0.9 mg/dl (ref 0.20–1.60)
Total Protein: 6.6 g/dL (ref 6.4–8.1)

## 2015-03-04 MED ORDER — HEPARIN SOD (PORK) LOCK FLUSH 100 UNIT/ML IV SOLN
500.0000 [IU] | Freq: Once | INTRAVENOUS | Status: AC | PRN
  Administered 2015-03-04: 500 [IU]
  Filled 2015-03-04: qty 5

## 2015-03-04 MED ORDER — SODIUM CHLORIDE 0.9 % IV SOLN
Freq: Once | INTRAVENOUS | Status: AC
  Administered 2015-03-04: 11:00:00 via INTRAVENOUS

## 2015-03-04 MED ORDER — DIPHENHYDRAMINE HCL 25 MG PO CAPS
ORAL_CAPSULE | ORAL | Status: AC
  Filled 2015-03-04: qty 2

## 2015-03-04 MED ORDER — SODIUM CHLORIDE 0.9 % IJ SOLN
10.0000 mL | INTRAMUSCULAR | Status: DC | PRN
  Administered 2015-03-04: 10 mL
  Filled 2015-03-04: qty 10

## 2015-03-04 MED ORDER — DIPHENHYDRAMINE HCL 25 MG PO CAPS
50.0000 mg | ORAL_CAPSULE | Freq: Once | ORAL | Status: AC
  Administered 2015-03-04: 50 mg via ORAL

## 2015-03-04 MED ORDER — ACETAMINOPHEN 325 MG PO TABS
650.0000 mg | ORAL_TABLET | Freq: Once | ORAL | Status: AC
  Administered 2015-03-04: 650 mg via ORAL

## 2015-03-04 MED ORDER — ACETAMINOPHEN 325 MG PO TABS
ORAL_TABLET | ORAL | Status: AC
  Filled 2015-03-04: qty 2

## 2015-03-04 MED ORDER — TRASTUZUMAB CHEMO INJECTION 440 MG
6.0000 mg/kg | Freq: Once | INTRAVENOUS | Status: AC
  Administered 2015-03-04: 462 mg via INTRAVENOUS
  Filled 2015-03-04: qty 22

## 2015-03-04 NOTE — Patient Instructions (Signed)
[facility] Discharge Instructions for Patients Receiving Chemotherapy  Today you received the following chemotherapy agents Herceptin  To help prevent nausea and vomiting after your treatment, we encourage you to take your nausea medication    If you develop nausea and vomiting that is not controlled by your nausea medication, call the clinic.   BELOW ARE SYMPTOMS THAT SHOULD BE REPORTED IMMEDIATELY:  *FEVER GREATER THAN 100.5 F  *CHILLS WITH OR WITHOUT FEVER  NAUSEA AND VOMITING THAT IS NOT CONTROLLED WITH YOUR NAUSEA MEDICATION  *UNUSUAL SHORTNESS OF BREATH  *UNUSUAL BRUISING OR BLEEDING  TENDERNESS IN MOUTH AND THROAT WITH OR WITHOUT PRESENCE OF ULCERS  *URINARY PROBLEMS  *BOWEL PROBLEMS  UNUSUAL RASH Items with * indicate a potential emergency and should be followed up as soon as possible.  Feel free to call the clinic you have any questions or concerns. The clinic phone number is [phone].  Please show the CHEMO ALERT CARD at check-in to the Emergency Department and triage nurse.   

## 2015-03-04 NOTE — Progress Notes (Signed)
Hematology and Oncology Follow Up Visit  Kim [name] [id number] January 24, 1935 80 y.o. 03/04/2015   Principle Diagnosis:  Stage IIA (T1N1M0) adenocarcinoma of the left breast-triple positive  Current Therapy:   Status post cycle 4 of Abraxane/Herceptin Maintenance Herceptin q 3wk Zometa 4 mg IV every 6 months Femara 2.5 mg by mouth daily     Interim History: Kim [name] is here today for follow-up and treatment. She is feeling much better today. She just returned from the beach with her family.  She has done well on maintenance Herceptin and has one last treatment after today.  Her EF in April was 55-60%. She denies fatigue, fever, chills, n/v, cough, rash, headache, dizziness, SOB, chest pain, palpitations, abdominal pain, constipation, diarrhea, blood in urine or stool.  No episodes of bleeding.  She has had no swelling or tenderness in her extremities. She has some mild neuropathy in her feet that is unchanged.  She has a good appetite and is staying hydrated. Her weight is stable.  Medications:    Medication List       This list is accurate as of: 03/04/15  1:06 PM.  Always use your most recent med list.               acetaminophen 650 MG CR tablet  Commonly known as:  TYLENOL  Take 650 mg by mouth every 8 (eight) hours as needed for pain.     acidophilus Caps capsule  Take 1 capsule by mouth daily.     aspirin 81 MG EC tablet  Take 1 tablet (81 mg total) by mouth daily.     CENTRUM ADULTS PO  Take by mouth.     cetirizine 10 MG tablet  Commonly known as:  ZYRTEC  Take 10 mg by mouth daily as needed for allergies.     CITRACAL MAXIMUM PO  Take 1 tablet by mouth daily.     CRESTOR 10 MG tablet  Generic drug:  rosuvastatin  TAKE 1 TABLET BY MOUTH ONCE A DAY     furosemide 20 MG tablet  Commonly known as:  LASIX  Take 1 tablet (20 mg total) by mouth daily. Take 1 tablet (20 mg total) every day x 3 days then take once daily as needed     IMODIUM PO    Take by mouth as needed.     letrozole 2.5 MG tablet  Commonly known as:  FEMARA  Take 1 tablet (2.5 mg total) by mouth daily.     levothyroxine 125 MCG tablet  Commonly known as:  SYNTHROID, LEVOTHROID  Take 1 tablet (125 mcg total) by mouth daily before breakfast.     lidocaine-prilocaine cream  Commonly known as:  EMLA  Apply topically as needed.     metoprolol succinate 50 MG 24 hr tablet  Commonly known as:  TOPROL XL  Take 1 tablet (50 mg total) by mouth 2 (two) times daily. Take with or immediately following a meal.     nitroGLYCERIN 0.4 MG SL tablet  Commonly known as:  NITROSTAT  Place 1 tablet (0.4 mg total) under the tongue every 5 (five) minutes x 3 doses as needed for chest pain.     Vitamin B-12 2500 MCG Subl  Place under the tongue as needed.     vitamin C 1000 MG tablet  Take 1,000 mg by mouth daily.        Allergies:  Allergies  Allergen Reactions  . Propoxyphene N-Acetaminophen Swelling    tongue  swelling  . Penicillins Swelling    Facial swelling  . Cortisone Rash  . Myrbetriq [Mirabegron]     Rash on face  . Pseudoephedrine Hcl Er Rash and Other (See Comments)    Face gets red    Past Medical History, Surgical history, Social history, and Family History were reviewed and updated.  Review of Systems: All other 10 point review of systems is negative.   Physical Exam:  weight is 171 lb (77.565 kg). Her temperature is 98 F (36.7 C). Her blood pressure is 136/51 and her pulse is 78. Her respiration is 16.   Wt Readings from Last 3 Encounters:  03/04/15 171 lb (77.565 kg)  01/30/15 170 lb (77.111 kg)  01/17/15 170 lb (77.111 kg)    Ocular: Sclerae unicteric, pupils equal, round and reactive to light Ear-nose-throat: Oropharynx clear, dentition fair Lymphatic: No cervical or supraclavicular adenopathy Lungs no rales or rhonchi, good excursion bilaterally Heart regular rate and rhythm, no murmur appreciated Abd soft, nontender, positive  bowel sounds MSK no focal spinal tenderness, no joint edema Neuro: non-focal, well-oriented, appropriate affect Breasts: No changes. No mass, lesion, rash or lymphedema.   Lab Results  Component Value Date   WBC 3.9 03/04/2015   HGB 10.6* 03/04/2015   HCT 32.4* 03/04/2015   MCV 86 03/04/2015   PLT 137* 03/04/2015   No results found for: FERRITIN, IRON, TIBC, UIBC, IRONPCTSAT Lab Results  Component Value Date   RBC 3.77 03/04/2015   No results found for: KPAFRELGTCHN, LAMBDASER, KAPLAMBRATIO No results found for: Kandis Cocking, IGMSERUM No results found for: Odetta Pink, SPEI   Chemistry      Component Value Date/Time   NA 139 03/04/2015 0937   NA 139 01/06/2015 1045   NA 137 01/28/2014 1354   K 3.6 03/04/2015 0937   K 3.8 01/06/2015 1045   K 4.3 01/28/2014 1354   CL 107 03/04/2015 0937   CL 106 01/06/2015 1045   CO2 26 03/04/2015 0937   CO2 26 01/06/2015 1045   CO2 21* 01/28/2014 1354   BUN 19 03/04/2015 0937   BUN 24* 01/06/2015 1045   BUN 35.3* 01/28/2014 1354   CREATININE 1.2 03/04/2015 0937   CREATININE 1.07 01/06/2015 1045   CREATININE 1.3* 01/28/2014 1354      Component Value Date/Time   CALCIUM 9.4 03/04/2015 0937   CALCIUM 9.3 01/06/2015 1045   CALCIUM 9.4 01/28/2014 1354   ALKPHOS 51 03/04/2015 0937   ALKPHOS 53 01/06/2015 1045   ALKPHOS 63 01/28/2014 1354   AST 20 03/04/2015 0937   AST 18 01/06/2015 1045   AST 10 01/28/2014 1354   ALT 18 03/04/2015 0937   ALT 13 01/06/2015 1045   ALT 13 01/28/2014 1354   BILITOT 0.90 03/04/2015 0937   BILITOT 0.6 01/06/2015 1045   BILITOT 0.76 01/28/2014 1354     Impression and Plan: Kim [name] is 80 year old white female with stage IIA ductal carcinoma of the left breast. She had 4 positive lymph nodes. Her breast cancer was triple positive. She completed her adjuvant chemotherapy in July 2015. She also completed radiation to the left breast.  She is doing  well and is asymptomatic at this time.  She will get herceptin today. We will see her back in 3 weeks for follow-up and her last treatment. She is very excited about this.  Her blood work today looks good.  She will continue on Zometa and Femara.  She knows to  call here with any questions or concerns. We can certainly see her sooner if need be.   Eliezer Bottom, NP 5/31/20161:06 PM

## 2015-03-05 ENCOUNTER — Telehealth: Payer: Self-pay | Admitting: Hematology & Oncology

## 2015-03-05 NOTE — Telephone Encounter (Signed)
MAILED OUT FUTURE APPT FOR JUN 21

## 2015-03-25 ENCOUNTER — Ambulatory Visit (HOSPITAL_BASED_OUTPATIENT_CLINIC_OR_DEPARTMENT_OTHER): Payer: Medicare Other

## 2015-03-25 ENCOUNTER — Ambulatory Visit (HOSPITAL_BASED_OUTPATIENT_CLINIC_OR_DEPARTMENT_OTHER): Payer: Medicare Other | Admitting: Hematology & Oncology

## 2015-03-25 ENCOUNTER — Encounter: Payer: Self-pay | Admitting: Hematology & Oncology

## 2015-03-25 ENCOUNTER — Other Ambulatory Visit (HOSPITAL_BASED_OUTPATIENT_CLINIC_OR_DEPARTMENT_OTHER): Payer: Medicare Other

## 2015-03-25 VITALS — BP 146/72 | HR 62 | Temp 97.7°F | Wt 171.0 lb

## 2015-03-25 DIAGNOSIS — C773 Secondary and unspecified malignant neoplasm of axilla and upper limb lymph nodes: Secondary | ICD-10-CM

## 2015-03-25 DIAGNOSIS — Z170 Estrogen receptor positive status [ER+]: Secondary | ICD-10-CM | POA: Diagnosis not present

## 2015-03-25 DIAGNOSIS — Z5112 Encounter for antineoplastic immunotherapy: Secondary | ICD-10-CM | POA: Diagnosis not present

## 2015-03-25 DIAGNOSIS — D0512 Intraductal carcinoma in situ of left breast: Secondary | ICD-10-CM

## 2015-03-25 DIAGNOSIS — C50412 Malignant neoplasm of upper-outer quadrant of left female breast: Secondary | ICD-10-CM

## 2015-03-25 DIAGNOSIS — C50919 Malignant neoplasm of unspecified site of unspecified female breast: Secondary | ICD-10-CM

## 2015-03-25 LAB — CBC WITH DIFFERENTIAL (CANCER CENTER ONLY)
BASO#: 0 10*3/uL (ref 0.0–0.2)
BASO%: 0.7 % (ref 0.0–2.0)
EOS%: 4 % (ref 0.0–7.0)
Eosinophils Absolute: 0.2 10*3/uL (ref 0.0–0.5)
HCT: 31.4 % — ABNORMAL LOW (ref 34.8–46.6)
HGB: 10.2 g/dL — ABNORMAL LOW (ref 11.6–15.9)
LYMPH#: 0.9 10*3/uL (ref 0.9–3.3)
LYMPH%: 19.8 % (ref 14.0–48.0)
MCH: 28 pg (ref 26.0–34.0)
MCHC: 32.5 g/dL (ref 32.0–36.0)
MCV: 86 fL (ref 81–101)
MONO#: 0.5 10*3/uL (ref 0.1–0.9)
MONO%: 10.8 % (ref 0.0–13.0)
NEUT#: 3 10*3/uL (ref 1.5–6.5)
NEUT%: 64.7 % (ref 39.6–80.0)
Platelets: 123 10*3/uL — ABNORMAL LOW (ref 145–400)
RBC: 3.64 10*6/uL — ABNORMAL LOW (ref 3.70–5.32)
RDW: 14.8 % (ref 11.1–15.7)
WBC: 4.6 10*3/uL (ref 3.9–10.0)

## 2015-03-25 LAB — COMPREHENSIVE METABOLIC PANEL
ALT: 10 U/L (ref 0–35)
AST: 14 U/L (ref 0–37)
Albumin: 4 g/dL (ref 3.5–5.2)
Alkaline Phosphatase: 45 U/L (ref 39–117)
BUN: 25 mg/dL — ABNORMAL HIGH (ref 6–23)
CO2: 25 mEq/L (ref 19–32)
Calcium: 9.1 mg/dL (ref 8.4–10.5)
Chloride: 106 mEq/L (ref 96–112)
Creatinine, Ser: 1.24 mg/dL — ABNORMAL HIGH (ref 0.50–1.10)
Glucose, Bld: 86 mg/dL (ref 70–99)
Potassium: 4.1 mEq/L (ref 3.5–5.3)
Sodium: 139 mEq/L (ref 135–145)
Total Bilirubin: 0.5 mg/dL (ref 0.2–1.2)
Total Protein: 6.2 g/dL (ref 6.0–8.3)

## 2015-03-25 MED ORDER — SODIUM CHLORIDE 0.9 % IV SOLN
Freq: Once | INTRAVENOUS | Status: AC
  Administered 2015-03-25: 12:00:00 via INTRAVENOUS

## 2015-03-25 MED ORDER — DIPHENHYDRAMINE HCL 25 MG PO CAPS
50.0000 mg | ORAL_CAPSULE | Freq: Once | ORAL | Status: AC
  Administered 2015-03-25: 50 mg via ORAL

## 2015-03-25 MED ORDER — SODIUM CHLORIDE 0.9 % IJ SOLN
10.0000 mL | INTRAMUSCULAR | Status: DC | PRN
  Administered 2015-03-25: 10 mL
  Filled 2015-03-25: qty 10

## 2015-03-25 MED ORDER — DIPHENHYDRAMINE HCL 25 MG PO CAPS
ORAL_CAPSULE | ORAL | Status: AC
  Filled 2015-03-25: qty 2

## 2015-03-25 MED ORDER — ACETAMINOPHEN 325 MG PO TABS
650.0000 mg | ORAL_TABLET | Freq: Once | ORAL | Status: AC
  Administered 2015-03-25: 650 mg via ORAL

## 2015-03-25 MED ORDER — HEPARIN SOD (PORK) LOCK FLUSH 100 UNIT/ML IV SOLN
500.0000 [IU] | Freq: Once | INTRAVENOUS | Status: AC | PRN
  Administered 2015-03-25: 500 [IU]
  Filled 2015-03-25: qty 5

## 2015-03-25 MED ORDER — TRASTUZUMAB CHEMO INJECTION 440 MG
6.0000 mg/kg | Freq: Once | INTRAVENOUS | Status: AC
  Administered 2015-03-25: 462 mg via INTRAVENOUS
  Filled 2015-03-25: qty 22

## 2015-03-25 MED ORDER — ACETAMINOPHEN 325 MG PO TABS
ORAL_TABLET | ORAL | Status: AC
  Filled 2015-03-25: qty 2

## 2015-03-25 NOTE — Progress Notes (Signed)
Hematology and Oncology Follow Up Visit  ASHLEE PLAYER [id number] Mar 30, 1935 80 y.o. 03/25/2015   Principle Diagnosis:  Stage IIA (T1N1M0) adenocarcinoma of the left breast-triple positive  Current Therapy:   Status post cycle 4 of Abraxane/Herceptin Maintenance Herceptin q 3wk - finish today!!!!! Zometa 4 mg IV every 6 months Femara 2.5 mg by mouth daily      Interim History:  Ms.  Eid is back for followup. She is doing pretty well. Her bladder procedure went well. She still has a little bit of leakage but not nearly as bad.  She and her family were down at the beach back in May. Had a good time. He did drain quite a bit.  She's had no problems with her heart. She's had no chest wall pain. His been no shortness of breath.  She's had no nausea or vomiting.  She's had no change in bowel or bladder habits.  There's been no arm or leg swelling. She's had no rashes. beach for a couple weeks. We will adjust her chemotherapy schedule.   She has a performance status of ECOG 1.    Medications:  Current outpatient prescriptions:  .  acetaminophen (TYLENOL) 650 MG CR tablet, Take 650 mg by mouth every 8 (eight) hours as needed for pain. , Disp: , Rfl:  .  acidophilus (RISAQUAD) CAPS capsule, Take 1 capsule by mouth daily., Disp: , Rfl:  .  Ascorbic Acid (VITAMIN C) 1000 MG tablet, Take 1,000 mg by mouth daily., Disp: , Rfl:  .  aspirin EC 81 MG EC tablet, Take 1 tablet (81 mg total) by mouth daily., Disp: , Rfl:  .  Calcium Citrate-Vitamin D (CITRACAL MAXIMUM PO), Take 1 tablet by mouth daily. , Disp: , Rfl:  .  cetirizine (ZYRTEC) 10 MG tablet, Take 10 mg by mouth daily as needed for allergies., Disp: , Rfl:  .  CRESTOR 10 MG tablet, TAKE 1 TABLET BY MOUTH ONCE A DAY (Patient taking differently: TAKE 1 TABLET BY MOUTH ONCE A DAY at night), Disp: 30 tablet, Rfl: 4 .  Cyanocobalamin (VITAMIN B-12) 2500 MCG SUBL, Place under the tongue as needed., Disp: , Rfl:  .  furosemide (LASIX)  20 MG tablet, Take 1 tablet (20 mg total) by mouth daily. Take 1 tablet (20 mg total) every day x 3 days then take once daily as needed (Patient taking differently: Take 20 mg by mouth at bedtime. Take 1 tablet (20 mg total) every day x 3 days then take once daily as needed), Disp: 30 tablet, Rfl: 0 .  letrozole (FEMARA) 2.5 MG tablet, Take 1 tablet (2.5 mg total) by mouth daily. (Patient taking differently: Take 2.5 mg by mouth every morning. ), Disp: 30 tablet, Rfl: 5 .  levothyroxine (SYNTHROID, LEVOTHROID) 125 MCG tablet, Take 1 tablet (125 mcg total) by mouth daily before breakfast., Disp: 90 tablet, Rfl: 3 .  lidocaine-prilocaine (EMLA) cream, Apply topically as needed., Disp: 30 g, Rfl: 6 .  Loperamide HCl (IMODIUM PO), Take by mouth as needed., Disp: , Rfl:  .  metoprolol succinate (TOPROL XL) 50 MG 24 hr tablet, Take 1 tablet (50 mg total) by mouth 2 (two) times daily. Take with or immediately following a meal., Disp: 60 tablet, Rfl: 6 .  Multiple Vitamins-Minerals (CENTRUM ADULTS PO), Take by mouth., Disp: , Rfl:  .  nitroGLYCERIN (NITROSTAT) 0.4 MG SL tablet, Place 1 tablet (0.4 mg total) under the tongue every 5 (five) minutes x 3 doses as needed for chest  pain., Disp: 25 tablet, Rfl: 3 .  [DISCONTINUED] ferrous sulfate 325 (65 FE) MG tablet, Take 325 mg by mouth 2 (two) times daily.  , Disp: , Rfl:   Allergies:  Allergies  Allergen Reactions  . Propoxyphene N-Acetaminophen Swelling    tongue swelling  . Penicillins Swelling    Facial swelling  . Cortisone Rash  . Myrbetriq [Mirabegron]     Rash on face  . Pseudoephedrine Hcl Er Rash and Other (See Comments)    Face gets red    Past Medical History, Surgical history, Social history, and Family History were reviewed and updated.  Review of Systems: As above  Physical Exam:  weight is 171 lb (77.565 kg). Her oral temperature is 97.7 F (36.5 C). Her blood pressure is 146/72 and her pulse is 62.   Well-developed and  well-nourished white female. Her head and neck exam shows no ocular or oral lesions. She has no palpable cervical or supraclavicular lymph nodes. Lungs are clear bilaterally. Cardiac exam regular rate and rhythm with a 2/6 systolic ejection murmur. Breast exam shows right breast with no masses edema or erythema. There is no right axillary adenopathy. Left breast shows well-healed lumpectomy at the 2:00 position. There might be a little bit of erythema. The might be a little bit of swelling. There is no tenderness at the lumpectomy site. There is no masses in the left breast. There is no left axillary adenopathy.. Abdomen is soft. She's good bowel sounds. There is no palpable liver or spleen tip. Back exam shows no tenderness over the spine ribs or hips. Extremities shows no clubbing cyanosis or edema. Neurological exam shows no focal neurological deficits.  Lab Results  Component Value Date   WBC 4.6 03/25/2015   HGB 10.2* 03/25/2015   HCT 31.4* 03/25/2015   MCV 86 03/25/2015   PLT 123* 03/25/2015     Chemistry      Component Value Date/Time   NA 139 03/04/2015 0937   NA 139 01/06/2015 1045   NA 137 01/28/2014 1354   K 3.6 03/04/2015 0937   K 3.8 01/06/2015 1045   K 4.3 01/28/2014 1354   CL 107 03/04/2015 0937   CL 106 01/06/2015 1045   CO2 26 03/04/2015 0937   CO2 26 01/06/2015 1045   CO2 21* 01/28/2014 1354   BUN 19 03/04/2015 0937   BUN 24* 01/06/2015 1045   BUN 35.3* 01/28/2014 1354   CREATININE 1.2 03/04/2015 0937   CREATININE 1.07 01/06/2015 1045   CREATININE 1.3* 01/28/2014 1354      Component Value Date/Time   CALCIUM 9.4 03/04/2015 0937   CALCIUM 9.3 01/06/2015 1045   CALCIUM 9.4 01/28/2014 1354   ALKPHOS 51 03/04/2015 0937   ALKPHOS 53 01/06/2015 1045   ALKPHOS 63 01/28/2014 1354   AST 20 03/04/2015 0937   AST 18 01/06/2015 1045   AST 10 01/28/2014 1354   ALT 18 03/04/2015 0937   ALT 13 01/06/2015 1045   ALT 13 01/28/2014 1354   BILITOT 0.90 03/04/2015 0937    BILITOT 0.6 01/06/2015 1045   BILITOT 0.76 01/28/2014 1354         Impression and Plan: Ms. Strub is 80 year old white female with stage IIA ductal carcinoma of the left breast. She had 4 positive lymph nodes. Her breast cancer was triple positive. She completed her adjuvant chemotherapy. She completed this in July of 2015.  Again, we'll finish up her Herceptin today.  We will plan to get her back in  about 3 months. She'll stay on the Femara.  I spent about 25-30 minutes with her.  [name], MD 6/21/201612:19 PM

## 2015-03-25 NOTE — Patient Instructions (Signed)
[facility] Discharge Instructions for Patients Receiving Chemotherapy  Today you received the following chemotherapy agents:  Herceptin  To help prevent nausea and vomiting after your treatment, we encourage you to take your nausea medication as prescribed.   If you develop nausea and vomiting that is not controlled by your nausea medication, call the clinic.   BELOW ARE SYMPTOMS THAT SHOULD BE REPORTED IMMEDIATELY:  *FEVER GREATER THAN 100.5 F  *CHILLS WITH OR WITHOUT FEVER  NAUSEA AND VOMITING THAT IS NOT CONTROLLED WITH YOUR NAUSEA MEDICATION  *UNUSUAL SHORTNESS OF BREATH  *UNUSUAL BRUISING OR BLEEDING  TENDERNESS IN MOUTH AND THROAT WITH OR WITHOUT PRESENCE OF ULCERS  *URINARY PROBLEMS  *BOWEL PROBLEMS  UNUSUAL RASH Items with * indicate a potential emergency and should be followed up as soon as possible.  Feel free to call the clinic you have any questions or concerns. The clinic phone number is [phone].  Please show the CHEMO ALERT CARD at check-in to the Emergency Department and triage nurse.   

## 2015-03-31 ENCOUNTER — Other Ambulatory Visit: Payer: Self-pay

## 2015-04-02 ENCOUNTER — Telehealth: Payer: Self-pay | Admitting: Hematology & Oncology

## 2015-04-02 NOTE — Telephone Encounter (Signed)
Resched pt's appt for Oct and mail out new calendar

## 2015-04-30 ENCOUNTER — Other Ambulatory Visit: Payer: Self-pay | Admitting: Family Medicine

## 2015-05-08 ENCOUNTER — Telehealth: Payer: Self-pay | Admitting: Cardiology

## 2015-05-08 NOTE — Telephone Encounter (Signed)
LMTCB--I have rescheduled pt's husband appt with Dr Aundra Dubin to 05/26/15 at 4:15PM and scheduled pt to see Dr Aundra Dubin 05/26/15 4:40PM. Pt's husband advised, LMTCB for pt.

## 2015-05-08 NOTE — Telephone Encounter (Signed)
Pt's husband advised, asked pt's husband to ask pt to call me back to confirm appt date and times.

## 2015-05-08 NOTE — Telephone Encounter (Signed)
New Message  Pt husband has an appt with Dr. Aundra Dubin on 08/19 @ 3:15p. Requests a call back to discuss if she can be worked in. She says that she is always worked in with her husband and she would like to continue to do so. Pleas assist

## 2015-05-09 NOTE — Telephone Encounter (Signed)
Follow up     Calling to confirm august 22 appointment for Mr and Mrs is ok.

## 2015-05-26 ENCOUNTER — Encounter: Payer: Self-pay | Admitting: Cardiology

## 2015-05-26 ENCOUNTER — Encounter: Payer: Self-pay | Admitting: *Deleted

## 2015-05-26 ENCOUNTER — Ambulatory Visit (INDEPENDENT_AMBULATORY_CARE_PROVIDER_SITE_OTHER): Payer: Medicare Other | Admitting: Cardiology

## 2015-05-26 VITALS — BP 124/66 | HR 69 | Ht 68.0 in | Wt 173.8 lb

## 2015-05-26 DIAGNOSIS — I421 Obstructive hypertrophic cardiomyopathy: Secondary | ICD-10-CM | POA: Diagnosis not present

## 2015-05-26 DIAGNOSIS — C50412 Malignant neoplasm of upper-outer quadrant of left female breast: Secondary | ICD-10-CM

## 2015-05-26 DIAGNOSIS — I2510 Atherosclerotic heart disease of native coronary artery without angina pectoris: Secondary | ICD-10-CM | POA: Diagnosis not present

## 2015-05-26 DIAGNOSIS — I422 Other hypertrophic cardiomyopathy: Secondary | ICD-10-CM

## 2015-05-26 NOTE — Patient Instructions (Signed)
Medication Instructions:  No changes today  Labwork: Your physician recommends that you return for a FASTING lipid profile: in December 2016. Testing/Procedures: Your physician has requested that you have an echocardiogram. Echocardiography is a painless test that uses sound waves to create images of your heart. It provides your doctor with information about the size and shape of your heart and how well your heart's chambers and valves are working. This procedure takes approximately one hour. There are no restrictions for this procedure.    Follow-Up: Your physician wants you to follow-up in: 6 months with Dr Aundra Dubin. (February 2017 You will receive a reminder letter in the mail two months in advance. If you don't receive a letter, please call our office to schedule the follow-up appointment.

## 2015-05-27 NOTE — Progress Notes (Signed)
Patient ID: Kim [name], female   DOB: 31-Mar-1935, 80 y.o.   MRN: [id number] PCP: Dr. Sarajane Jews Oncologist: Dr. Marin Olp  80 yo with history of CAD and HOCM presents for cardiology evaluation given use of Herceptin with her breast cancer chemo.  Patient is s/p lumpectomy with lymph node biopsy in 2/15.  4/10 nodes positive.  She was started on docetaxol/carboplatin/Herceptin in 3/15 with plan for 6 cycles chemo. She had a tough time with her 1st chemotherapy session and felt very weak afterwards.  Shortly after, she developed unstable angina and ended up getting a DES to the mid RCA in 3/15.  Patient additionally has a history of HOCM.  This has been recognized on prior echoes.  She has severe asymmetric basal septal hypertrophy and SAM with LVOT gradient peak 58 mmHg on echo in 3/15 along with moderate MR.  Repeat echo in 7/15 showed LVOT gradient down to 30 mmHg on higher beta blocker.  Repeat echo in 11/15 showed no significant LVOT gradient but SAM still present.  Echo (4/16) showed severe asymmetric septal hypertrophy, SAM not mentioned, mild LVOT gradient 20 mmHg.   She returns for followup today.  She has completed Herceptin treatment.  Overall, she is doing well.  No chest pain.  No dyspnea on flat ground, mild dyspnea walking up hills.  Bilateral knee pain is limiting.  No lightheadedness or syncope.  No orthopnea/PND.    Labs (3/15): K 4.5, creatinine 0.84, LDL 91, HDL 46 Labs (5/15): K 3.9, creatinine 1.1 Labs (12/15): LDL 80, HDL 28, hemoglobin 10.9 Labs (1/16): K 3.7, creatinine 0.8 Labs (2/16): K 3.6, creatinine 1.2, HCT 34.4 Labs (6/16): K 4.1, creatinine 1.24  PMH: 1. CAD: Unstable angina 3/15 with LHC showing 99% mRCA stenosis, treated with DES to mRCA.  2. Hypothyroidism 3. Diverticulosis 4. HTN 5. H/o TAH/BSO 6. Breast cancer: s/p lumpectomy with lymph node biopsy in 2/15.  4/10 nodes positive.  She was started on docetaxol/carboplatin/Herceptin in 3/15 with plan for 6 cycles  chemo.  7. Hypertrophic obstructive cardiomyopathy: Echo (3/15) with severe focal basal septal hypertrophy (22 mm), narrow LV outflow tract with mitral valve SAM and 58 mmHg peak LVOT resting gradient, EF 60-65%, moderate MR, moderate LAE, normal RV, lateral s' 10.4 cm/sec.  Patient says that her 2 grown sons has had echoes to screen for HOCM.  Echo (4/15) with EF 65-70%, severe focal basal septal hypertrophy, LVOT gradient 33 mmHg, SAM was present with moderate MR, normal RV size and systolic function, lateral S' 10.6, GLS -17.5%.  Cardiac MRI (5/15) with EF 65%, moderate asymmetric septal hypertrophy, systolic anterior motion of the mitral valve with moderate MR, there was no delayed enhancement.  Echo (7/15) with EF 65-70%, moderate ASH, peak LVOT gradient 30 mmHg, SAM with moderate MR, moderate to severe LAE.  Echo (11/15) with EF 55-60%, GLS -15%, no significant LVOT gradient, systolic anterior motion of the mitral valve with mild MR, normal RV size and systolic function. Echo (4/14) with EF 55-60%, severe asymmetric septal hypertrophy, LVOT gradient 20 mmHg, mild-moderate MR, GLS -18%.  8. Hyperlipidemia: Myalgias with atorvastatin  SH: Married, 2 children, retired, nonsmoker.   FH: No family history of HOCM or sudden death.  There is a family history of CAD.   ROS: All systems reviewed and negative except as per HPI.    Current Outpatient Prescriptions  Medication Sig Dispense Refill  . acetaminophen (TYLENOL) 650 MG CR tablet Take 650 mg by mouth every 8 (eight) hours as  needed for pain.     Marland Kitchen acidophilus (RISAQUAD) CAPS capsule Take 1 capsule by mouth daily.    . Ascorbic Acid (VITAMIN C) 1000 MG tablet Take 1,000 mg by mouth daily.    Marland Kitchen aspirin EC 81 MG EC tablet Take 1 tablet (81 mg total) by mouth daily.    . Calcium Citrate-Vitamin D (CITRACAL MAXIMUM PO) Take 1 tablet by mouth daily.     . cetirizine (ZYRTEC) 10 MG tablet Take 10 mg by mouth daily as needed for allergies.    Marland Kitchen CRESTOR  10 MG tablet TAKE 1 TABLET BY MOUTH ONCE A DAY (Patient taking differently: TAKE 1 TABLET BY MOUTH ONCE A DAY at night) 30 tablet 4  . Cyanocobalamin (VITAMIN B-12) 2500 MCG SUBL Place under the tongue as needed.    . furosemide (LASIX) 20 MG tablet Take 1 tablet (20 mg total) by mouth daily. Take 1 tablet (20 mg total) every day x 3 days then take once daily as needed (Patient taking differently: Take 20 mg by mouth at bedtime. Take 1 tablet (20 mg total) every day x 3 days then take once daily as needed) 30 tablet 0  . letrozole (FEMARA) 2.5 MG tablet Take 1 tablet (2.5 mg total) by mouth daily. (Patient taking differently: Take 2.5 mg by mouth every morning. ) 30 tablet 5  . levothyroxine (SYNTHROID, LEVOTHROID) 125 MCG tablet TAKE 1 TABLET BY MOUTH ONCE A DAY BEFOREBREAKFAST 30 tablet 0  . lidocaine-prilocaine (EMLA) cream Apply topically as needed. 30 g 6  . Loperamide HCl (IMODIUM PO) Take by mouth as needed.    . metoprolol succinate (TOPROL XL) 50 MG 24 hr tablet Take 1 tablet (50 mg total) by mouth 2 (two) times daily. Take with or immediately following a meal. 60 tablet 6  . Multiple Vitamins-Minerals (CENTRUM ADULTS PO) Take by mouth.    . nitroGLYCERIN (NITROSTAT) 0.4 MG SL tablet Place 1 tablet (0.4 mg total) under the tongue every 5 (five) minutes x 3 doses as needed for chest pain. 25 tablet 3  . [DISCONTINUED] ferrous sulfate 325 (65 FE) MG tablet Take 325 mg by mouth 2 (two) times daily.       No current facility-administered medications for this visit.   BP 124/66 mmHg  Pulse 69  Ht 5\' 8"  (1.727 m)  Wt 173 lb 12.8 oz (78.835 kg)  BMI 26.43 kg/m2  SpO2 96%  LMP 10/19/1991 General: NAD Neck: No JVD, no thyromegaly or thyroid nodule.  Lungs: Clear to auscultation bilaterally with normal respiratory effort. CV: Nondisplaced PMI.  Heart regular S1/S2, no S3/S4, 2/6 SEM RUSB.  No peripheral edema.  No carotid bruit.  Normal pedal pulses.  Abdomen: Soft, nontender, no  hepatosplenomegaly, no distention.  Skin: Intact without lesions or rashes.  Neurologic: Alert and oriented x 3.  Psych: Normal affect. Extremities: No clubbing or cyanosis.   Assessment/Plan: 1. CAD: Status post PCI for unstable angina in 3/15 with DES to [facility]. She is now off Plavix.  - Continue ASA 81, statin, and Toprol XL.     2. Hyperlipidemia: She is tolerating Crestor and lipids were ok in 12/15. Repeat lipids 12/16.  3. Hypertrophic obstructive cardiomyopathy: No family history of HOCM or sudden death (interestingly, her husband has HOCM).  Most recent echo showed a mild LVOT gradient.  She tolerated increase in Toprol XL without problems.  Cardiac MRI in 5/15 showed no delayed enhancement.  - Continue current Toprol XL.  - Per patient, her  2 sons (in their 110s) have had screening echoes to look for hypertrophic cardiomyopathy and did not show signs of HOCM.   4. Breast cancer/Herceptin: She completed Herceptin in 6/16.  I will have her get one more echo to assess EF after Herceptin completion.     [name] 05/27/2015

## 2015-05-30 ENCOUNTER — Ambulatory Visit (HOSPITAL_COMMUNITY): Payer: Medicare Other | Attending: Cardiovascular Disease

## 2015-05-30 ENCOUNTER — Other Ambulatory Visit: Payer: Self-pay

## 2015-05-30 DIAGNOSIS — I10 Essential (primary) hypertension: Secondary | ICD-10-CM | POA: Insufficient documentation

## 2015-05-30 DIAGNOSIS — I340 Nonrheumatic mitral (valve) insufficiency: Secondary | ICD-10-CM | POA: Diagnosis not present

## 2015-05-30 DIAGNOSIS — I517 Cardiomegaly: Secondary | ICD-10-CM | POA: Diagnosis not present

## 2015-05-30 DIAGNOSIS — I422 Other hypertrophic cardiomyopathy: Secondary | ICD-10-CM | POA: Diagnosis not present

## 2015-06-24 ENCOUNTER — Ambulatory Visit: Payer: Medicare Other

## 2015-06-24 ENCOUNTER — Ambulatory Visit: Payer: Medicare Other | Admitting: Hematology & Oncology

## 2015-06-24 ENCOUNTER — Other Ambulatory Visit: Payer: Medicare Other

## 2015-06-26 ENCOUNTER — Encounter: Payer: Self-pay | Admitting: Gastroenterology

## 2015-07-07 ENCOUNTER — Other Ambulatory Visit: Payer: Self-pay | Admitting: Hematology & Oncology

## 2015-07-08 ENCOUNTER — Ambulatory Visit: Payer: Medicare Other

## 2015-07-08 ENCOUNTER — Encounter: Payer: Self-pay | Admitting: Hematology & Oncology

## 2015-07-08 ENCOUNTER — Other Ambulatory Visit (HOSPITAL_BASED_OUTPATIENT_CLINIC_OR_DEPARTMENT_OTHER): Payer: Medicare Other

## 2015-07-08 ENCOUNTER — Encounter: Payer: Self-pay | Admitting: Gastroenterology

## 2015-07-08 ENCOUNTER — Ambulatory Visit (HOSPITAL_BASED_OUTPATIENT_CLINIC_OR_DEPARTMENT_OTHER): Payer: Medicare Other | Admitting: Hematology & Oncology

## 2015-07-08 VITALS — BP 115/64 | HR 79 | Temp 98.1°F | Resp 16 | Ht 68.0 in | Wt 171.0 lb

## 2015-07-08 DIAGNOSIS — Z171 Estrogen receptor negative status [ER-]: Secondary | ICD-10-CM

## 2015-07-08 DIAGNOSIS — C50412 Malignant neoplasm of upper-outer quadrant of left female breast: Secondary | ICD-10-CM

## 2015-07-08 DIAGNOSIS — C773 Secondary and unspecified malignant neoplasm of axilla and upper limb lymph nodes: Secondary | ICD-10-CM

## 2015-07-08 DIAGNOSIS — D0512 Intraductal carcinoma in situ of left breast: Secondary | ICD-10-CM | POA: Diagnosis not present

## 2015-07-08 DIAGNOSIS — C50012 Malignant neoplasm of nipple and areola, left female breast: Secondary | ICD-10-CM

## 2015-07-08 LAB — CBC WITH DIFFERENTIAL (CANCER CENTER ONLY)
BASO#: 0 10*3/uL (ref 0.0–0.2)
BASO%: 0.7 % (ref 0.0–2.0)
EOS%: 1.9 % (ref 0.0–7.0)
Eosinophils Absolute: 0.1 10*3/uL (ref 0.0–0.5)
HCT: 35 % (ref 34.8–46.6)
HGB: 11 g/dL — ABNORMAL LOW (ref 11.6–15.9)
LYMPH#: 0.8 10*3/uL — ABNORMAL LOW (ref 0.9–3.3)
LYMPH%: 17.7 % (ref 14.0–48.0)
MCH: 26.3 pg (ref 26.0–34.0)
MCHC: 31.4 g/dL — ABNORMAL LOW (ref 32.0–36.0)
MCV: 84 fL (ref 81–101)
MONO#: 0.3 10*3/uL (ref 0.1–0.9)
MONO%: 6.5 % (ref 0.0–13.0)
NEUT#: 3.1 10*3/uL (ref 1.5–6.5)
NEUT%: 73.2 % (ref 39.6–80.0)
Platelets: 133 10*3/uL — ABNORMAL LOW (ref 145–400)
RBC: 4.18 10*6/uL (ref 3.70–5.32)
RDW: 14.9 % (ref 11.1–15.7)
WBC: 4.3 10*3/uL (ref 3.9–10.0)

## 2015-07-08 LAB — CMP (CANCER CENTER ONLY)
ALT(SGPT): 18 U/L (ref 10–47)
AST: 19 U/L (ref 11–38)
Albumin: 3.6 g/dL (ref 3.3–5.5)
Alkaline Phosphatase: 58 U/L (ref 26–84)
BUN, Bld: 23 mg/dL — ABNORMAL HIGH (ref 7–22)
CO2: 27 mEq/L (ref 18–33)
Calcium: 9.6 mg/dL (ref 8.0–10.3)
Chloride: 104 mEq/L (ref 98–108)
Creat: 1.4 mg/dl — ABNORMAL HIGH (ref 0.6–1.2)
Glucose, Bld: 105 mg/dL (ref 73–118)
Potassium: 4 mEq/L (ref 3.3–4.7)
Sodium: 139 mEq/L (ref 128–145)
Total Bilirubin: 0.7 mg/dl (ref 0.20–1.60)
Total Protein: 6.6 g/dL (ref 6.4–8.1)

## 2015-07-08 LAB — LACTATE DEHYDROGENASE (CC13): LDH: 211 U/L (ref 125–245)

## 2015-07-08 NOTE — Progress Notes (Signed)
Hematology and Oncology Follow Up Visit  Kim [name] [id number] 1934-12-22 80 y.o. 07/08/2015   Principle Diagnosis:  Stage IIA (T1N1M0) adenocarcinoma of the left breast-triple positive  Current Therapy:   Status post cycle 4 of Abraxane/Herceptin Maintenance Herceptin q 3wk - finish 6/21 Zometa 4 mg IV every 6 months Femara 2.5 mg by mouth daily      Interim History:  Kim [name] is back for followup. She had a pretty good summer. Unfortunately, the bladder procedure that she had is not doing much for her. She still has a lot of leakage. She will go back to the surgeon for this.  She completed her 1 year of Herceptin in June.  She is doing well with the Femara. She not have any issues with Femara with respect to arthralgias.  She's had no cough. There's been no Worst. She's had no leg swelling. She's had no rashes.  She's had no cardiac issues. I think she still sees the cardiologist.   She has a performance status of ECOG 1.    Medications:  Current outpatient prescriptions:  .  acetaminophen (TYLENOL) 650 MG CR tablet, Take 650 mg by mouth every 8 (eight) hours as needed for pain. , Disp: , Rfl:  .  acidophilus (RISAQUAD) CAPS capsule, Take 1 capsule by mouth daily., Disp: , Rfl:  .  Ascorbic Acid (VITAMIN C) 1000 MG tablet, Take 1,000 mg by mouth daily., Disp: , Rfl:  .  aspirin EC 81 MG EC tablet, Take 1 tablet (81 mg total) by mouth daily., Disp: , Rfl:  .  Calcium Citrate-Vitamin D (CITRACAL MAXIMUM PO), Take 1 tablet by mouth daily. , Disp: , Rfl:  .  cetirizine (ZYRTEC) 10 MG tablet, Take 10 mg by mouth daily as needed for allergies., Disp: , Rfl:  .  CRESTOR 10 MG tablet, TAKE 1 TABLET BY MOUTH ONCE A DAY (Patient taking differently: TAKE 1 TABLET BY MOUTH ONCE A DAY at night), Disp: 30 tablet, Rfl: 4 .  Cyanocobalamin (VITAMIN B-12) 2500 MCG SUBL, Place under the tongue as needed., Disp: , Rfl:  .  furosemide (LASIX) 20 MG tablet, Take 1 tablet (20 mg total) by  mouth daily. Take 1 tablet (20 mg total) every day x 3 days then take once daily as needed (Patient taking differently: Take 20 mg by mouth at bedtime. Take 1 tablet (20 mg total) every day x 3 days then take once daily as needed), Disp: 30 tablet, Rfl: 0 .  letrozole (FEMARA) 2.5 MG tablet, TAKE ONE TABLET BY MOUTH EVERY DAY, Disp: 30 tablet, Rfl: 2 .  levothyroxine (SYNTHROID, LEVOTHROID) 125 MCG tablet, TAKE 1 TABLET BY MOUTH ONCE A DAY BEFOREBREAKFAST, Disp: 30 tablet, Rfl: 0 .  lidocaine-prilocaine (EMLA) cream, Apply topically as needed., Disp: 30 g, Rfl: 6 .  Loperamide HCl (IMODIUM PO), Take by mouth as needed., Disp: , Rfl:  .  metoprolol succinate (TOPROL XL) 50 MG 24 hr tablet, Take 1 tablet (50 mg total) by mouth 2 (two) times daily. Take with or immediately following a meal., Disp: 60 tablet, Rfl: 6 .  Multiple Vitamins-Minerals (CENTRUM ADULTS PO), Take by mouth., Disp: , Rfl:  .  nitroGLYCERIN (NITROSTAT) 0.4 MG SL tablet, Place 1 tablet (0.4 mg total) under the tongue every 5 (five) minutes x 3 doses as needed for chest pain., Disp: 25 tablet, Rfl: 3 .  [DISCONTINUED] ferrous sulfate 325 (65 FE) MG tablet, Take 325 mg by mouth 2 (two) times daily.  ,  Disp: , Rfl:   Allergies:  Allergies  Allergen Reactions  . Propoxyphene N-Acetaminophen Swelling    tongue swelling  . Penicillins Swelling    Facial swelling  . Cortisone Rash  . Myrbetriq [Mirabegron]     Rash on face  . Pseudoephedrine Hcl Er Rash and Other (See Comments)    Face gets red    Past Medical History, Surgical history, Social history, and Family History were reviewed and updated.  Review of Systems: As above  Physical Exam:  height is 5\' 8"  (1.727 m) and weight is 171 lb (77.565 kg). Her oral temperature is 98.1 F (36.7 C). Her blood pressure is 115/64 and her pulse is 79. Her respiration is 16.   Well-developed and well-nourished white female. Her head and neck exam shows no ocular or oral lesions. She  has no palpable cervical or supraclavicular lymph nodes. Lungs are clear bilaterally. Cardiac exam regular rate and rhythm with a 2/6 systolic ejection murmur. Breast exam shows right breast with no masses edema or erythema. There is no right axillary adenopathy. Left breast shows well-healed lumpectomy at the 2:00 position. There might be a little bit of erythema. The might be a little bit of swelling. There is no tenderness at the lumpectomy site. There is no masses in the left breast. There is no left axillary adenopathy.. Abdomen is soft. She's good bowel sounds. There is no palpable liver or spleen tip. Back exam shows no tenderness over the spine ribs or hips. Extremities shows no clubbing cyanosis or edema. Neurological exam shows no focal neurological deficits.  Lab Results  Component Value Date   WBC 4.3 07/08/2015   HGB 11.0* 07/08/2015   HCT 35.0 07/08/2015   MCV 84 07/08/2015   PLT 133* 07/08/2015     Chemistry      Component Value Date/Time   NA 139 07/08/2015 0855   NA 139 03/25/2015 1131   NA 137 01/28/2014 1354   K 4.0 07/08/2015 0855   K 4.1 03/25/2015 1131   K 4.3 01/28/2014 1354   CL 104 07/08/2015 0855   CL 106 03/25/2015 1131   CO2 27 07/08/2015 0855   CO2 25 03/25/2015 1131   CO2 21* 01/28/2014 1354   BUN 23* 07/08/2015 0855   BUN 25* 03/25/2015 1131   BUN 35.3* 01/28/2014 1354   CREATININE 1.4* 07/08/2015 0855   CREATININE 1.24* 03/25/2015 1131   CREATININE 1.3* 01/28/2014 1354      Component Value Date/Time   CALCIUM 9.6 07/08/2015 0855   CALCIUM 9.1 03/25/2015 1131   CALCIUM 9.4 01/28/2014 1354   ALKPHOS 58 07/08/2015 0855   ALKPHOS 45 03/25/2015 1131   ALKPHOS 63 01/28/2014 1354   AST 19 07/08/2015 0855   AST 14 03/25/2015 1131   AST 10 01/28/2014 1354   ALT 18 07/08/2015 0855   ALT 10 03/25/2015 1131   ALT 13 01/28/2014 1354   BILITOT 0.70 07/08/2015 0855   BILITOT 0.5 03/25/2015 1131   BILITOT 0.76 01/28/2014 1354         Impression and  Plan: Kim [name] is 80 year old white female with stage IIA ductal carcinoma of the left breast. She had 4 positive lymph nodes. Her breast cancer was triple positive. She completed her adjuvant chemotherapy. She completed this in July of 2015.  We will continue her on Femara. She will need 10 years of Femara.  She wants her Port-A-Cath taken out. I will see if her surgeon can do this for [place].  I think that she would benefit from Zometa or Prolia. I probably would consider Prolia for her as this is a subcutaneous injection.  We will plan to see her back in 3 months.  We will plan to get her back in about 3 months. She'll stay on the Femara.  I spent about 25-30 minutes with her.  [name], MD 10/4/20161:51 PM

## 2015-07-19 ENCOUNTER — Other Ambulatory Visit: Payer: Self-pay | Admitting: Family Medicine

## 2015-08-02 ENCOUNTER — Other Ambulatory Visit: Payer: Self-pay | Admitting: Cardiology

## 2015-09-02 ENCOUNTER — Ambulatory Visit (AMBULATORY_SURGERY_CENTER): Payer: Self-pay | Admitting: *Deleted

## 2015-09-02 ENCOUNTER — Telehealth: Payer: Self-pay | Admitting: *Deleted

## 2015-09-02 VITALS — Ht 68.0 in | Wt 173.4 lb

## 2015-09-02 DIAGNOSIS — Z86010 Personal history of colonic polyps: Secondary | ICD-10-CM

## 2015-09-02 MED ORDER — NA SULFATE-K SULFATE-MG SULF 17.5-3.13-1.6 GM/177ML PO SOLN: 1.0000 | Freq: Once | ORAL | Status: DC

## 2015-09-02 NOTE — Telephone Encounter (Signed)
Thank you. Will proceed as scheduled  Marie pv

## 2015-09-02 NOTE — Telephone Encounter (Signed)
Port a cath removal will not interfere with colonoscopy 1 week later

## 2015-09-02 NOTE — Telephone Encounter (Signed)
Pt was here this am for a PV. Her colon is 12-13 Tuesday with you.  She is having her port a cath removed as an out patient 12-6, Tuesday . She just wants to be sure there will not be an reason she should not have her colon the 13th.  Kim [name] PV

## 2015-09-02 NOTE — Progress Notes (Signed)
No egg or soy allergy No issues with past sedation No diet pills No home 02 use     

## 2015-09-04 ENCOUNTER — Other Ambulatory Visit (INDEPENDENT_AMBULATORY_CARE_PROVIDER_SITE_OTHER): Payer: Medicare Other | Admitting: *Deleted

## 2015-09-04 DIAGNOSIS — I200 Unstable angina: Secondary | ICD-10-CM | POA: Diagnosis not present

## 2015-09-04 DIAGNOSIS — E785 Hyperlipidemia, unspecified: Secondary | ICD-10-CM

## 2015-09-04 DIAGNOSIS — I10 Essential (primary) hypertension: Secondary | ICD-10-CM

## 2015-09-04 LAB — LIPID PANEL
Cholesterol: 216 mg/dL — ABNORMAL HIGH (ref 125–200)
HDL: 30 mg/dL — ABNORMAL LOW (ref >=46)
LDL Cholesterol: 144 mg/dL — ABNORMAL HIGH (ref <130)
Total CHOL/HDL Ratio: 7.2 Ratio — ABNORMAL HIGH (ref <=5.0)
Triglycerides: 212 mg/dL — ABNORMAL HIGH (ref <150)
VLDL: 42 mg/dL — ABNORMAL HIGH (ref <30)

## 2015-09-04 NOTE — Addendum Note (Signed)
Addended by: Eulis Foster on: 09/04/2015 03:51 PM   Modules accepted: Orders

## 2015-09-10 ENCOUNTER — Telehealth: Payer: Self-pay | Admitting: *Deleted

## 2015-09-10 DIAGNOSIS — E785 Hyperlipidemia, unspecified: Secondary | ICD-10-CM

## 2015-09-10 DIAGNOSIS — I2510 Atherosclerotic heart disease of native coronary artery without angina pectoris: Secondary | ICD-10-CM

## 2015-09-10 MED ORDER — PRAVASTATIN SODIUM 40 MG PO TABS: 40.0000 mg | ORAL_TABLET | Freq: Every evening | ORAL | Status: DC

## 2015-09-10 NOTE — Telephone Encounter (Signed)
Notes Recorded by Larey Dresser, MD on 09/08/2015 at 9:15 PM Would have her try pravastatin 40 mg daily. Have her call us if she has recurrent symptoms, would then send her to lipid clinic for Repatha initiation. Lipids/LFTs in 2 months on pravastatin. Would try coenzyme Q10 200 mg daily also.

## 2015-09-16 ENCOUNTER — Encounter: Payer: Medicare Other | Admitting: Gastroenterology

## 2015-10-07 ENCOUNTER — Other Ambulatory Visit (HOSPITAL_BASED_OUTPATIENT_CLINIC_OR_DEPARTMENT_OTHER): Payer: Medicare Other

## 2015-10-07 ENCOUNTER — Ambulatory Visit (HOSPITAL_BASED_OUTPATIENT_CLINIC_OR_DEPARTMENT_OTHER): Payer: Medicare Other | Admitting: Hematology & Oncology

## 2015-10-07 ENCOUNTER — Encounter: Payer: Self-pay | Admitting: Hematology & Oncology

## 2015-10-07 VITALS — BP 144/73 | HR 71 | Temp 97.5°F | Resp 16 | Ht 68.0 in | Wt 169.0 lb

## 2015-10-07 DIAGNOSIS — C50912 Malignant neoplasm of unspecified site of left female breast: Secondary | ICD-10-CM

## 2015-10-07 DIAGNOSIS — M818 Other osteoporosis without current pathological fracture: Secondary | ICD-10-CM

## 2015-10-07 DIAGNOSIS — C50012 Malignant neoplasm of nipple and areola, left female breast: Secondary | ICD-10-CM

## 2015-10-07 DIAGNOSIS — Z170 Estrogen receptor positive status [ER+]: Secondary | ICD-10-CM

## 2015-10-07 DIAGNOSIS — R51 Headache: Secondary | ICD-10-CM | POA: Diagnosis not present

## 2015-10-07 DIAGNOSIS — C50412 Malignant neoplasm of upper-outer quadrant of left female breast: Secondary | ICD-10-CM

## 2015-10-07 DIAGNOSIS — C773 Secondary and unspecified malignant neoplasm of axilla and upper limb lymph nodes: Secondary | ICD-10-CM | POA: Diagnosis not present

## 2015-10-07 DIAGNOSIS — T386X5A Adverse effect of antigonadotrophins, antiestrogens, antiandrogens, not elsewhere classified, initial encounter: Secondary | ICD-10-CM

## 2015-10-07 LAB — CBC WITH DIFFERENTIAL (CANCER CENTER ONLY)
BASO#: 0 10*3/uL (ref 0.0–0.2)
BASO%: 0.2 % (ref 0.0–2.0)
EOS%: 4.1 % (ref 0.0–7.0)
Eosinophils Absolute: 0.2 10*3/uL (ref 0.0–0.5)
HCT: 34.7 % — ABNORMAL LOW (ref 34.8–46.6)
HGB: 10.7 g/dL — ABNORMAL LOW (ref 11.6–15.9)
LYMPH#: 1 10*3/uL (ref 0.9–3.3)
LYMPH%: 20.9 % (ref 14.0–48.0)
MCH: 24.9 pg — ABNORMAL LOW (ref 26.0–34.0)
MCHC: 30.8 g/dL — ABNORMAL LOW (ref 32.0–36.0)
MCV: 81 fL (ref 81–101)
MONO#: 0.6 10*3/uL (ref 0.1–0.9)
MONO%: 11.5 % (ref 0.0–13.0)
NEUT#: 3.1 10*3/uL (ref 1.5–6.5)
NEUT%: 63.3 % (ref 39.6–80.0)
Platelets: 140 10*3/uL — ABNORMAL LOW (ref 145–400)
RBC: 4.29 10*6/uL (ref 3.70–5.32)
RDW: 15.8 % — ABNORMAL HIGH (ref 11.1–15.7)
WBC: 4.9 10*3/uL (ref 3.9–10.0)

## 2015-10-07 LAB — COMPREHENSIVE METABOLIC PANEL
ALT: 9 U/L (ref 0–55)
AST: 13 U/L (ref 5–34)
Albumin: 3.9 g/dL (ref 3.5–5.0)
Alkaline Phosphatase: 60 U/L (ref 40–150)
Anion Gap: 8 mEq/L (ref 3–11)
BUN: 20.7 mg/dL (ref 7.0–26.0)
CO2: 26 mEq/L (ref 22–29)
Calcium: 9.7 mg/dL (ref 8.4–10.4)
Chloride: 107 mEq/L (ref 98–109)
Creatinine: 1.1 mg/dL (ref 0.6–1.1)
EGFR: 50 mL/min/{1.73_m2} — ABNORMAL LOW (ref >90)
Glucose: 98 mg/dl (ref 70–140)
Potassium: 4.6 mEq/L (ref 3.5–5.1)
Sodium: 141 mEq/L (ref 136–145)
Total Bilirubin: 0.54 mg/dL (ref 0.20–1.20)
Total Protein: 7.2 g/dL (ref 6.4–8.3)

## 2015-10-07 LAB — VITAMIN D 25 HYDROXY (VIT D DEFICIENCY, FRACTURES): Vit D, 25-Hydroxy: 43 ng/mL (ref 30–100)

## 2015-10-07 NOTE — Progress Notes (Addendum)
Hematology and Oncology Follow Up Visit  Kim [name] [id number] Jan 20, 1935 81 y.o. 10/07/2015   Principle Diagnosis:  Stage IIA (T1N1M0) adenocarcinoma of the left breast-triple positive  Current Therapy:   Status post cycle 4 of Abraxane/Herceptin Maintenance Herceptin q 3wk - finish 6/21 Zometa 4 mg IV every 6 months - due in March 2017 Femara 2.5 mg by mouth daily      Interim History:  [name] is back for followup. She is having problems with her cholesterol medicine. She is being followed by cardiology for this. She is getting a lot of muscle aches and cramps.  She is also complaining of some headaches. These are intermittent. She has no focal neurological symptoms from this.  She's had no rashes. She had no change in bowel or bladder habits. She's had no leg swelling.  She's had no cardiac issues.  Her Port-A-Cath was taken out in October. She is happy that this was taken out.  She is taking some calcium and vitamin D.   She has a performance status of ECOG 1.    Medications:  Current outpatient prescriptions:  .  acetaminophen (TYLENOL) 650 MG CR tablet, Take 650 mg by mouth every 8 (eight) hours as needed for pain. , Disp: , Rfl:  .  acidophilus (RISAQUAD) CAPS capsule, Take 1 capsule by mouth daily., Disp: , Rfl:  .  Ascorbic Acid (VITAMIN C) 1000 MG tablet, Take 1,000 mg by mouth daily., Disp: , Rfl:  .  aspirin EC 81 MG EC tablet, Take 1 tablet (81 mg total) by mouth daily., Disp: , Rfl:  .  Calcium Citrate-Vitamin D (CITRACAL MAXIMUM PO), Take 1 tablet by mouth daily. , Disp: , Rfl:  .  cetirizine (ZYRTEC) 10 MG tablet, Take 10 mg by mouth daily as needed for allergies., Disp: , Rfl:  .  Coenzyme Q10 200 MG TABS, One tablet by mouth daily, Disp: , Rfl: 0 .  Cyanocobalamin (VITAMIN B-12) 2500 MCG SUBL, Place under the tongue as needed., Disp: , Rfl:  .  furosemide (LASIX) 20 MG tablet, Take 1 tablet (20 mg total) by mouth daily. Take 1 tablet (20 mg total)  every day x 3 days then take once daily as needed, Disp: 30 tablet, Rfl: 0 .  letrozole (FEMARA) 2.5 MG tablet, TAKE ONE TABLET BY MOUTH EVERY DAY, Disp: 30 tablet, Rfl: 2 .  levothyroxine (SYNTHROID, LEVOTHROID) 125 MCG tablet, TAKE 1 TABLET BY MOUTH ONCE A DAY BEFOREBREAKFAST, Disp: 90 tablet, Rfl: 0 .  lidocaine-prilocaine (EMLA) cream, Apply topically as needed., Disp: 30 g, Rfl: 6 .  Loperamide HCl (IMODIUM PO), Take by mouth as needed., Disp: , Rfl:  .  metoprolol succinate (TOPROL-XL) 50 MG 24 hr tablet, TAKE 1 TABLET BY MOUTH TWICE A DAY ** TAKE WITH OR IMMEDIATELY FOLLOWING A MEAL **, Disp: 60 tablet, Rfl: 8 .  Multiple Vitamins-Minerals (CENTRUM ADULTS PO), Take by mouth., Disp: , Rfl:  .  Na Sulfate-K Sulfate-Mg Sulf (SUPREP BOWEL PREP) SOLN, Take 1 kit by mouth once. suprep as directed. No substitutions, Disp: 354 mL, Rfl: 0 .  nitroGLYCERIN (NITROSTAT) 0.4 MG SL tablet, Place 1 tablet (0.4 mg total) under the tongue every 5 (five) minutes x 3 doses as needed for chest pain., Disp: 25 tablet, Rfl: 3 .  [DISCONTINUED] ferrous sulfate 325 (65 FE) MG tablet, Take 325 mg by mouth 2 (two) times daily.  , Disp: , Rfl:   Allergies:  Allergies  Allergen Reactions  . Propoxyphene  N-Acetaminophen Swelling    tongue swelling  . Penicillins Swelling    Facial swelling  . Cortisone Rash  . Myrbetriq [Mirabegron]     Rash on face  . Pseudoephedrine Hcl Er Rash and Other (See Comments)    Face gets red    Past Medical History, Surgical history, Social history, and Family History were reviewed and updated.  Review of Systems: As above  Physical Exam:  height is '5\' 8"'$  (1.727 m) and weight is 169 lb (76.658 kg). Her oral temperature is 97.5 F (36.4 C). Her blood pressure is 144/73 and her pulse is 71. Her respiration is 16.   Well-developed and well-nourished white female. Her head and neck exam shows no ocular or oral lesions. She has no palpable cervical or supraclavicular lymph nodes.  Lungs are clear bilaterally. Cardiac exam regular rate and rhythm with a 2/6 systolic ejection murmur. Breast exam shows right breast with no masses edema or erythema. There is no right axillary adenopathy. Left breast shows well-healed lumpectomy at the 2:00 position. There might be a little bit of erythema. The might be a little bit of swelling. There is no tenderness at the lumpectomy site. There is no masses in the left breast. There is no left axillary adenopathy.. Abdomen is soft. She's good bowel sounds. There is no palpable liver or spleen tip. Back exam shows no tenderness over the spine ribs or hips. Extremities shows no clubbing cyanosis or edema. Neurological exam shows no focal neurological deficits.  Lab Results  Component Value Date   WBC 4.9 10/07/2015   HGB 10.7* 10/07/2015   HCT 34.7* 10/07/2015   MCV 81 10/07/2015   PLT 140* 10/07/2015     Chemistry      Component Value Date/Time   NA 139 07/08/2015 0855   NA 139 03/25/2015 1131   NA 137 01/28/2014 1354   K 4.0 07/08/2015 0855   K 4.1 03/25/2015 1131   K 4.3 01/28/2014 1354   CL 104 07/08/2015 0855   CL 106 03/25/2015 1131   CO2 27 07/08/2015 0855   CO2 25 03/25/2015 1131   CO2 21* 01/28/2014 1354   BUN 23* 07/08/2015 0855   BUN 25* 03/25/2015 1131   BUN 35.3* 01/28/2014 1354   CREATININE 1.4* 07/08/2015 0855   CREATININE 1.24* 03/25/2015 1131   CREATININE 1.3* 01/28/2014 1354      Component Value Date/Time   CALCIUM 9.6 07/08/2015 0855   CALCIUM 9.1 03/25/2015 1131   CALCIUM 9.4 01/28/2014 1354   ALKPHOS 58 07/08/2015 0855   ALKPHOS 45 03/25/2015 1131   ALKPHOS 63 01/28/2014 1354   AST 19 07/08/2015 0855   AST 14 03/25/2015 1131   AST 10 01/28/2014 1354   ALT 18 07/08/2015 0855   ALT 10 03/25/2015 1131   ALT 13 01/28/2014 1354   BILITOT 0.70 07/08/2015 0855   BILITOT 0.5 03/25/2015 1131   BILITOT 0.76 01/28/2014 1354         Impression and Plan: Kim [name] is 81 year old white female with  stage IIA ductal carcinoma of the left breast. She had 4 positive lymph nodes. Her breast cancer was triple positive. She completed her adjuvant chemotherapy. She completed this in July of 2015.  We will continue her on Femara. She will need 10 years of Femara.  From my point of view, everything looks okay. I'm not sure what the headaches are all about. They seem to come and go. I don't think she needs any scans right now.  I cannot find anything neurologic on her exam.  She is mildly anemic. This is holding steady.  We will go ahead and plan to get her back in 3 months. I think everything looks skin 3 months, then we'll probably go every 4 months after that.   I spent about 25-30 minutes with her.  [name], MD 1/3/20179:06 AM

## 2015-10-08 ENCOUNTER — Telehealth: Payer: Self-pay | Admitting: *Deleted

## 2015-10-08 NOTE — Telephone Encounter (Signed)
-----   Message from [name], MD sent at 10/08/2015  6:38 AM EST ----- Call - vit D level is ok!  Keep taking Vit D!!!!  pete

## 2015-10-09 ENCOUNTER — Telehealth: Payer: Self-pay | Admitting: *Deleted

## 2015-10-09 NOTE — Telephone Encounter (Signed)
-----   Message from [name], MD sent at 10/08/2015  6:38 AM EST ----- Call - vit D level is ok!  Keep taking Vit D!!!!  Kim [name]

## 2015-10-13 ENCOUNTER — Other Ambulatory Visit: Payer: Self-pay | Admitting: Hematology & Oncology

## 2015-10-15 ENCOUNTER — Other Ambulatory Visit: Payer: Self-pay | Admitting: General Surgery

## 2015-10-15 DIAGNOSIS — Z853 Personal history of malignant neoplasm of breast: Secondary | ICD-10-CM

## 2015-10-15 DIAGNOSIS — Z9889 Other specified postprocedural states: Secondary | ICD-10-CM

## 2015-10-18 ENCOUNTER — Other Ambulatory Visit: Payer: Self-pay | Admitting: Family Medicine

## 2015-10-20 ENCOUNTER — Other Ambulatory Visit: Payer: Self-pay | Admitting: Family Medicine

## 2015-10-20 NOTE — Telephone Encounter (Signed)
Pt is on the schedule for 10/22/2015 to see Dr. Sarajane Jews, will send in script for Synthroid ( 30 day supply ).

## 2015-10-21 ENCOUNTER — Ambulatory Visit
Admission: RE | Admit: 2015-10-21 | Discharge: 2015-10-21 | Disposition: A | Discharge status: Home / Self Care | Payer: Medicare Other | Source: Ambulatory Visit | Attending: General Surgery | Admitting: General Surgery

## 2015-10-21 DIAGNOSIS — Z9889 Other specified postprocedural states: Secondary | ICD-10-CM

## 2015-10-21 DIAGNOSIS — Z853 Personal history of malignant neoplasm of breast: Secondary | ICD-10-CM

## 2015-10-22 ENCOUNTER — Ambulatory Visit (INDEPENDENT_AMBULATORY_CARE_PROVIDER_SITE_OTHER): Payer: Medicare Other | Admitting: Family Medicine

## 2015-10-22 ENCOUNTER — Encounter: Payer: Self-pay | Admitting: Family Medicine

## 2015-10-22 VITALS — BP 157/84 | HR 81 | Temp 98.3°F | Ht 68.0 in | Wt 172.0 lb

## 2015-10-22 DIAGNOSIS — E039 Hypothyroidism, unspecified: Secondary | ICD-10-CM | POA: Diagnosis not present

## 2015-10-22 DIAGNOSIS — Z23 Encounter for immunization: Secondary | ICD-10-CM | POA: Diagnosis not present

## 2015-10-22 DIAGNOSIS — G4485 Primary stabbing headache: Secondary | ICD-10-CM | POA: Diagnosis not present

## 2015-10-22 LAB — TSH: TSH: 0.15 u[IU]/mL — ABNORMAL LOW (ref 0.35–4.50)

## 2015-10-22 MED ORDER — LEVOTHYROXINE SODIUM 125 MCG PO TABS: ORAL_TABLET | ORAL | Status: DC

## 2015-10-22 MED ORDER — GABAPENTIN 100 MG PO CAPS: 100.0000 mg | ORAL_CAPSULE | Freq: Three times a day (TID) | ORAL | Status: DC

## 2015-10-22 NOTE — Progress Notes (Signed)
Pre visit review using our clinic review tool, if applicable. No additional management support is needed unless otherwise documented below in the visit note. 

## 2015-10-22 NOTE — Addendum Note (Signed)
Addended by: Aggie Hacker A on: 10/22/2015 02:15 PM   Modules accepted: Orders

## 2015-10-22 NOTE — Progress Notes (Signed)
   Subjective:    Patient ID: Kim [name], female    DOB: [birth year], 81 y.o.   MRN: [id number]  HPI Here to follow up on hypothyroidism and also to discuss frequent headaches. She describes intermittent fleeting sharp pains that can involve either the right or the left temple areas. These last only 5-10 seconds and then go away. They are not associated with any neurologic symptoms. She usually gets them at night when sitting in her recliner watching TV. She may get these 3 or 4 times a week, or she may go several weeks with none. They started about 5 years ago but she has never mentioned them before. She sees Oncology regularly and has frequent labs which I reviewed showing normal renal function, glucoses, etc.    Review of Systems  Constitutional: Negative.   Respiratory: Negative.   Cardiovascular: Negative.   Endocrine: Negative.   Neurological: Positive for headaches.       Objective:   Physical Exam  Constitutional: She is oriented to person, place, and time. She appears well-developed and well-nourished.  HENT:  Head: Normocephalic and atraumatic.  Right Ear: External ear normal.  Nose: Nose normal.  Mouth/Throat: Oropharynx is clear and moist.  Eyes: Conjunctivae and EOM are normal. Pupils are equal, round, and reactive to light.  Neck: Neck supple. No thyromegaly present.  Cardiovascular: Normal rate, regular rhythm, normal heart sounds and intact distal pulses.   Pulmonary/Chest: Effort normal and breath sounds normal.  Lymphadenopathy:    She has no cervical adenopathy.  Neurological: She is alert and oriented to person, place, and time. No cranial nerve deficit.          Assessment & Plan:  For the thyroid, she will get a TSH today. She describes a typical pattern of "icepick headaches" and she will try Gabapentin for 3 months to see if we can control these.

## 2015-10-27 ENCOUNTER — Telehealth: Payer: Self-pay | Admitting: General Practice

## 2015-10-27 NOTE — Telephone Encounter (Signed)
Cholesterol medication has been discontinued.

## 2015-10-28 ENCOUNTER — Telehealth: Payer: Self-pay | Admitting: Family Medicine

## 2015-10-28 ENCOUNTER — Other Ambulatory Visit: Payer: Self-pay | Admitting: Family Medicine

## 2015-10-28 DIAGNOSIS — E039 Hypothyroidism, unspecified: Secondary | ICD-10-CM

## 2015-10-28 MED ORDER — LEVOTHYROXINE SODIUM 112 MCG PO TABS: 112.0000 ug | ORAL_TABLET | Freq: Every day | ORAL | Status: DC

## 2015-10-28 NOTE — Addendum Note (Signed)
Addended by: Aggie Hacker A on: 10/28/2015 02:46 PM   Modules accepted: Orders, Medications

## 2015-10-28 NOTE — Telephone Encounter (Signed)
Can you please call pt and schedule a lab appointment? She is due for TSH in 3 months, the future order is in computer.

## 2015-10-29 NOTE — Telephone Encounter (Signed)
Pt has been scheduled.  °

## 2015-11-04 ENCOUNTER — Encounter: Payer: Self-pay | Admitting: Gastroenterology

## 2015-11-04 ENCOUNTER — Ambulatory Visit (AMBULATORY_SURGERY_CENTER): Payer: Medicare Other | Admitting: Gastroenterology

## 2015-11-04 VITALS — BP 136/79 | HR 65 | Temp 98.3°F | Resp 18

## 2015-11-04 DIAGNOSIS — Z86010 Personal history of colonic polyps: Secondary | ICD-10-CM

## 2015-11-04 DIAGNOSIS — D122 Benign neoplasm of ascending colon: Secondary | ICD-10-CM

## 2015-11-04 DIAGNOSIS — D123 Benign neoplasm of transverse colon: Secondary | ICD-10-CM | POA: Diagnosis not present

## 2015-11-04 HISTORY — PX: COLONOSCOPY: SHX174

## 2015-11-04 MED ORDER — SODIUM CHLORIDE 0.9 % IV SOLN: 500.0000 mL | INTRAVENOUS | Status: DC

## 2015-11-04 NOTE — Patient Instructions (Signed)
YOU HAD AN ENDOSCOPIC PROCEDURE TODAY AT [facility]:   Refer to the procedure report that was given to you for any specific questions about what was found during the examination.  If the procedure report does not answer your questions, please call your gastroenterologist to clarify.  If you requested that your care partner not be given the details of your procedure findings, then the procedure report has been included in a sealed envelope for you to review at your convenience later.  YOU SHOULD EXPECT: Some feelings of bloating in the abdomen. Passage of more gas than usual.  Walking can help get rid of the air that was put into your GI tract during the procedure and reduce the bloating. If you had a lower endoscopy (such as a colonoscopy or flexible sigmoidoscopy) you may notice spotting of blood in your stool or on the toilet paper. If you underwent a bowel prep for your procedure, you may not have a normal bowel movement for a few days.  Please Note:  You might notice some irritation and congestion in your nose or some drainage.  This is from the oxygen used during your procedure.  There is no need for concern and it should clear up in a day or so.  SYMPTOMS TO REPORT IMMEDIATELY:   Following lower endoscopy (colonoscopy or flexible sigmoidoscopy):  Excessive amounts of blood in the stool  Significant tenderness or worsening of abdominal pains  Swelling of the abdomen that is new, acute  Fever of 100F or higher   For urgent or emergent issues, a gastroenterologist can be reached at any hour by calling [phone].   DIET: Your first meal following the procedure should be a small meal and then it is ok to progress to your normal diet. Heavy or fried foods are harder to digest and may make you feel nauseous or bloated.  Likewise, meals heavy in dairy and vegetables can increase bloating.  Drink plenty of fluids but you should avoid alcoholic beverages for 24 hours.  Try to  increase the fiber in your diet due to your Diverticulosis.  ACTIVITY:  You should plan to take it easy for the rest of today and you should NOT DRIVE or use heavy machinery until tomorrow (because of the sedation medicines used during the test).    FOLLOW UP: Our staff will call the number listed on your records the next business day following your procedure to check on you and address any questions or concerns that you may have regarding the information given to you following your procedure. If we do not reach you, we will leave a message.  However, if you are feeling well and you are not experiencing any problems, there is no need to return our call.  We will assume that you have returned to your regular daily activities without incident.  If any biopsies were taken you will be contacted by phone or by letter within the next 1-3 weeks.  Please call us at [phone] if you have not heard about the biopsies in 3 weeks.    SIGNATURES/CONFIDENTIALITY: You and/or your care partner have signed paperwork which will be entered into your electronic medical record.  These signatures attest to the fact that that the information above on your After Visit Summary has been reviewed and is understood.  Full responsibility of the confidentiality of this discharge information lies with you and/or your care-partner.  This will be your last colonoscopy unless you have any problems.

## 2015-11-04 NOTE — Progress Notes (Signed)
Report to PACU, RN, vss, BBS= Clear.  

## 2015-11-04 NOTE — Op Note (Signed)
[place]  [address] [place], [id number]   COLONOSCOPY PROCEDURE REPORT  PATIENT: Kim [name], Kim [name]  MR#: [id number] BIRTHDATE: 15-Jul-1935 , 80  yrs. old GENDER: female ENDOSCOPIST: Ladene Artist, MD, [facility] PROCEDURE DATE:  11/04/2015 PROCEDURE:   Colonoscopy, surveillance and Colonoscopy with snare polypectomy First Screening Colonoscopy - Avg.  risk and is 50 yrs.  old or older - No.  Prior Negative Screening - Now for repeat screening. N/A  History of Adenoma - Now for follow-up colonoscopy & has been > or = to 3 yrs.  Yes hx of adenoma.  Has been 3 or more years since last colonoscopy.  Polyps removed today? Yes ASA CLASS:   Class III INDICATIONS:Surveillance due to prior colonic neoplasia and Advanced Neoplasm (= 10 mm, high grade dysplasia, villous component. MEDICATIONS: Monitored anesthesia care and Propofol 250 mg IV DESCRIPTION OF PROCEDURE:   After the risks benefits and alternatives of the procedure were thoroughly explained, informed consent was obtained.  The digital rectal exam revealed no abnormalities of the rectum.   The LB [id number]  endoscope was introduced through the anus and advanced to the cecum, which was identified by both the appendix and ileocecal valve. No adverse events experienced.   The quality of the prep was good.  (Suprep was used)  The instrument was then slowly withdrawn as the colon was fully examined. Estimated blood loss is zero unless otherwise noted in this procedure report.    COLON FINDINGS: Three sessile polyps measuring 6-7 mm in size were found in the transverse colon (2) and ascending colon (1). Polypectomies were performed with a cold snare.  The resection was complete, the polyp tissue was completely retrieved and sent to histology. There was moderate diverticulosis noted in the sigmoid colon with associated luminal narrowing, tortuosity and muscular hypertrophy. The examination was otherwise  normal.  Retroflexed views revealed no abnormalities. The time to cecum = 8.1 Withdrawal time = 12.4   The scope was withdrawn and the procedure completed. COMPLICATIONS: There were no immediate complications.  ENDOSCOPIC IMPRESSION: 1.   Three sessile polyps in the transverse colon and ascending colon; polypectomies performed with a cold snare 2.   Moderate diverticulosis  in the sigmoid colon  RECOMMENDATIONS: 1.  Await pathology results 2.  High fiber diet with liberal fluid intake. 3.  Given your age, you will not need another colonoscopy for colon cancer screening or polyp surveillance.  These types of tests usually stop around the age 41.  eSigned:  Ladene Artist, MD, [facility] 11/04/2015 9:01 AM

## 2015-11-04 NOTE — Progress Notes (Signed)
Called to room to assist during endoscopic procedure.  Patient ID and intended procedure confirmed with present staff. Received instructions for my participation in the procedure from the performing physician.  

## 2015-11-05 ENCOUNTER — Telehealth: Payer: Self-pay | Admitting: *Deleted

## 2015-11-05 NOTE — Telephone Encounter (Signed)
  Follow up Call-  Call back number 11/04/2015  Post procedure Call Back phone  # [phone]  Permission to leave phone message Yes     Patient questions:  Do you have a fever, pain , or abdominal swelling? No. Pain Score  0 *  Have you tolerated food without any problems? Yes.    Have you been able to return to your normal activities? Yes.    Do you have any questions about your discharge instructions: Diet   No. Medications  No. Follow up visit  No.  Do you have questions or concerns about your Care? No.  Actions: * If pain score is 4 or above: No action needed, pain <4.  Pt. Stated that she had sneezing and nose running explained to pt. That that was a reaction from the oxygen that she received and it will clear up in a day or so. She stated ok everything else was ok.

## 2015-11-11 ENCOUNTER — Encounter: Payer: Self-pay | Admitting: Gastroenterology

## 2015-11-12 ENCOUNTER — Other Ambulatory Visit (INDEPENDENT_AMBULATORY_CARE_PROVIDER_SITE_OTHER): Payer: Medicare Other

## 2015-11-12 DIAGNOSIS — E785 Hyperlipidemia, unspecified: Secondary | ICD-10-CM | POA: Diagnosis not present

## 2015-11-12 DIAGNOSIS — I2510 Atherosclerotic heart disease of native coronary artery without angina pectoris: Secondary | ICD-10-CM

## 2015-11-12 LAB — HEPATIC FUNCTION PANEL
ALT: 8 U/L (ref 6–29)
AST: 13 U/L (ref 10–35)
Albumin: 3.7 g/dL (ref 3.6–5.1)
Alkaline Phosphatase: 45 U/L (ref 33–130)
Bilirubin, Direct: 0.1 mg/dL (ref <=0.2)
Indirect Bilirubin: 0.4 mg/dL (ref 0.2–1.2)
Total Bilirubin: 0.5 mg/dL (ref 0.2–1.2)
Total Protein: 6.4 g/dL (ref 6.1–8.1)

## 2015-11-12 LAB — LIPID PANEL
Cholesterol: 163 mg/dL (ref 125–200)
HDL: 34 mg/dL — ABNORMAL LOW (ref >=46)
LDL Cholesterol: 104 mg/dL (ref <130)
Total CHOL/HDL Ratio: 4.8 Ratio (ref <=5.0)
Triglycerides: 123 mg/dL (ref <150)
VLDL: 25 mg/dL (ref <30)

## 2015-11-20 ENCOUNTER — Telehealth: Payer: Self-pay | Admitting: Cardiology

## 2015-11-20 DIAGNOSIS — E785 Hyperlipidemia, unspecified: Secondary | ICD-10-CM

## 2015-11-20 MED ORDER — PRAVASTATIN SODIUM 80 MG PO TABS: 80.0000 mg | ORAL_TABLET | Freq: Every evening | ORAL | Status: DC

## 2015-11-20 NOTE — Telephone Encounter (Signed)
Spoke to pt and informed her of new orders per Dr. Aundra Dubin. Pt verbalized understanding and was in agreement with this plan.

## 2015-11-20 NOTE — Telephone Encounter (Signed)
Returning call from yesterday,concerning her lab results.

## 2015-11-20 NOTE — Telephone Encounter (Signed)
Pt is aware of lab work and MD recommendations to increased Crestor to 20 mg once daily to have LDL less then 70. Pt's LDL is 104. Pt  States that she can't take Crestor. Crestor was D/C on 09/10/15 due to bone pain. Pt was started on Pravastatin 40 mg once daily and Coenzyme Q10 200 mg daily. Pt has been taking the Pravastatin 40 mg since January 2017.

## 2015-11-20 NOTE — Telephone Encounter (Signed)
If she's doing ok on the pravastatin, increase it to 80 mg daily.  Lipids/LFTs in 2 months.

## 2016-01-07 ENCOUNTER — Encounter: Payer: Self-pay | Admitting: Hematology & Oncology

## 2016-01-07 ENCOUNTER — Other Ambulatory Visit (HOSPITAL_BASED_OUTPATIENT_CLINIC_OR_DEPARTMENT_OTHER): Payer: Medicare Other

## 2016-01-07 ENCOUNTER — Ambulatory Visit (HOSPITAL_BASED_OUTPATIENT_CLINIC_OR_DEPARTMENT_OTHER): Payer: Medicare Other

## 2016-01-07 ENCOUNTER — Ambulatory Visit (HOSPITAL_BASED_OUTPATIENT_CLINIC_OR_DEPARTMENT_OTHER): Payer: Medicare Other | Admitting: Hematology & Oncology

## 2016-01-07 VITALS — BP 119/76 | HR 64 | Temp 98.4°F | Resp 16 | Ht 68.0 in | Wt 170.0 lb

## 2016-01-07 DIAGNOSIS — T386X5A Adverse effect of antigonadotrophins, antiestrogens, antiandrogens, not elsewhere classified, initial encounter: Secondary | ICD-10-CM

## 2016-01-07 DIAGNOSIS — C50912 Malignant neoplasm of unspecified site of left female breast: Secondary | ICD-10-CM | POA: Diagnosis not present

## 2016-01-07 DIAGNOSIS — Z79811 Long term (current) use of aromatase inhibitors: Secondary | ICD-10-CM | POA: Diagnosis not present

## 2016-01-07 DIAGNOSIS — C50012 Malignant neoplasm of nipple and areola, left female breast: Secondary | ICD-10-CM

## 2016-01-07 DIAGNOSIS — C779 Secondary and unspecified malignant neoplasm of lymph node, unspecified: Secondary | ICD-10-CM

## 2016-01-07 DIAGNOSIS — C50812 Malignant neoplasm of overlapping sites of left female breast: Secondary | ICD-10-CM

## 2016-01-07 DIAGNOSIS — K909 Intestinal malabsorption, unspecified: Secondary | ICD-10-CM

## 2016-01-07 DIAGNOSIS — C50412 Malignant neoplasm of upper-outer quadrant of left female breast: Secondary | ICD-10-CM

## 2016-01-07 DIAGNOSIS — D500 Iron deficiency anemia secondary to blood loss (chronic): Secondary | ICD-10-CM

## 2016-01-07 DIAGNOSIS — C50811 Malignant neoplasm of overlapping sites of right female breast: Secondary | ICD-10-CM

## 2016-01-07 DIAGNOSIS — Z9889 Other specified postprocedural states: Secondary | ICD-10-CM

## 2016-01-07 DIAGNOSIS — M818 Other osteoporosis without current pathological fracture: Secondary | ICD-10-CM

## 2016-01-07 HISTORY — DX: Iron deficiency anemia secondary to blood loss (chronic): D50.0

## 2016-01-07 HISTORY — DX: Intestinal malabsorption, unspecified: K90.9

## 2016-01-07 LAB — CBC WITH DIFFERENTIAL (CANCER CENTER ONLY)
BASO#: 0 10*3/uL (ref 0.0–0.2)
BASO%: 0.3 % (ref 0.0–2.0)
EOS%: 2.1 % (ref 0.0–7.0)
Eosinophils Absolute: 0.1 10*3/uL (ref 0.0–0.5)
HCT: 33.1 % — ABNORMAL LOW (ref 34.8–46.6)
HGB: 10.4 g/dL — ABNORMAL LOW (ref 11.6–15.9)
LYMPH#: 1.1 10*3/uL (ref 0.9–3.3)
LYMPH%: 19.5 % (ref 14.0–48.0)
MCH: 24.7 pg — ABNORMAL LOW (ref 26.0–34.0)
MCHC: 31.4 g/dL — ABNORMAL LOW (ref 32.0–36.0)
MCV: 79 fL — ABNORMAL LOW (ref 81–101)
MONO#: 0.4 10*3/uL (ref 0.1–0.9)
MONO%: 6.8 % (ref 0.0–13.0)
NEUT#: 4.1 10*3/uL (ref 1.5–6.5)
NEUT%: 71.3 % (ref 39.6–80.0)
Platelets: 146 10*3/uL (ref 145–400)
RBC: 4.21 10*6/uL (ref 3.70–5.32)
RDW: 16.9 % — ABNORMAL HIGH (ref 11.1–15.7)
WBC: 5.7 10*3/uL (ref 3.9–10.0)

## 2016-01-07 LAB — CMP (CANCER CENTER ONLY)
ALT(SGPT): 22 U/L (ref 10–47)
AST: 17 U/L (ref 11–38)
Albumin: 3.5 g/dL (ref 3.3–5.5)
Alkaline Phosphatase: 52 U/L (ref 26–84)
BUN, Bld: 25 mg/dL — ABNORMAL HIGH (ref 7–22)
CO2: 25 mEq/L (ref 18–33)
Calcium: 9.3 mg/dL (ref 8.0–10.3)
Chloride: 106 mEq/L (ref 98–108)
Creat: 1.2 mg/dl (ref 0.6–1.2)
Glucose, Bld: 86 mg/dL (ref 73–118)
Potassium: 3.9 mEq/L (ref 3.3–4.7)
Sodium: 135 mEq/L (ref 128–145)
Total Bilirubin: 0.8 mg/dl (ref 0.20–1.60)
Total Protein: 6.4 g/dL (ref 6.4–8.1)

## 2016-01-07 LAB — IRON AND TIBC
%SAT: 7 % — ABNORMAL LOW (ref 21–57)
Iron: 30 ug/dL — ABNORMAL LOW (ref 41–142)
TIBC: 401 ug/dL (ref 236–444)
UIBC: 371 ug/dL (ref 120–384)

## 2016-01-07 LAB — FERRITIN: Ferritin: 21 ng/ml (ref 9–269)

## 2016-01-07 MED ORDER — SODIUM CHLORIDE 0.9 % IV SOLN
510.0000 mg | Freq: Once | INTRAVENOUS | Status: AC
  Administered 2016-01-07: 510 mg via INTRAVENOUS
  Filled 2016-01-07: qty 17

## 2016-01-07 MED ORDER — DENOSUMAB 60 MG/ML ~~LOC~~ SOLN
60.0000 mg | Freq: Once | SUBCUTANEOUS | Status: AC
  Administered 2016-01-07: 60 mg via SUBCUTANEOUS
  Filled 2016-01-07: qty 1

## 2016-01-07 NOTE — Progress Notes (Signed)
Hematology and Oncology Follow Up Visit  Kim [name] [id number] [birth year] 81 y.o. 01/07/2016   Principle Diagnosis:  Stage IIA (T1N1M0) adenocarcinoma of the left breast-triple positive Iron deficiency anemia secondary to blood loss  Current Therapy:   Status post cycle 4 of Abraxane/Herceptin Maintenance Herceptin q 3wk - finish 6/21 Prolia 60gm sq every 6 months - due in March 2017 Femara 2.5 mg by mouth daily  Feraheme as needed-dose given today     Interim History:  [name] is back for followup. She is still having problems with cholesterol medicine. She hasn't dizziness. She has myalgias. She really wants to stop taking cholesterol medicine. She wants try taking some fish oil. I told her this would be okay. I also told her about red rice yeast. This also is used for cholesterol.  She is having some bleeding. She's had this in the past. She's had some blood per rectum. I think this is been evaluated.  She's had no fever. She's had no cough. She's had no cardiac issues.  She has a performance status of ECOG 1.  Medications:  Current outpatient prescriptions:  .  acetaminophen (TYLENOL) 650 MG CR tablet, Take 650 mg by mouth every 8 (eight) hours as needed for pain. , Disp: , Rfl:  .  acidophilus (RISAQUAD) CAPS capsule, Take 1 capsule by mouth daily. Reported on 10/22/2015, Disp: , Rfl:  .  Ascorbic Acid (VITAMIN C) 1000 MG tablet, Take 1,000 mg by mouth daily., Disp: , Rfl:  .  aspirin EC 81 MG EC tablet, Take 1 tablet (81 mg total) by mouth daily., Disp: , Rfl:  .  Calcium Citrate-Vitamin D (CITRACAL MAXIMUM PO), Take 1 tablet by mouth daily. , Disp: , Rfl:  .  cetirizine (ZYRTEC) 10 MG tablet, Take 10 mg by mouth daily as needed for allergies., Disp: , Rfl:  .  Coenzyme Q10 200 MG TABS, One tablet by mouth daily, Disp: , Rfl: 0 .  Cyanocobalamin (VITAMIN B-12) 2500 MCG SUBL, Place under the tongue as needed., Disp: , Rfl:  .  furosemide (LASIX) 20 MG tablet, Take 1  tablet (20 mg total) by mouth daily. Take 1 tablet (20 mg total) every day x 3 days then take once daily as needed, Disp: 30 tablet, Rfl: 0 .  gabapentin (NEURONTIN) 100 MG capsule, Take 1 capsule (100 mg total) by mouth 3 (three) times daily., Disp: 270 capsule, Rfl: 0 .  letrozole (FEMARA) 2.5 MG tablet, TAKE 1 TABLET BY MOUTH ONCE A DAY, Disp: 30 tablet, Rfl: 3 .  levothyroxine (SYNTHROID, LEVOTHROID) 112 MCG tablet, Take 1 tablet (112 mcg total) by mouth daily., Disp: 90 tablet, Rfl: 0 .  lidocaine-prilocaine (EMLA) cream, Apply topically as needed., Disp: 30 g, Rfl: 6 .  Loperamide HCl (IMODIUM PO), Take by mouth as needed., Disp: , Rfl:  .  metoprolol succinate (TOPROL-XL) 50 MG 24 hr tablet, TAKE 1 TABLET BY MOUTH TWICE A DAY ** TAKE WITH OR IMMEDIATELY FOLLOWING A MEAL **, Disp: 60 tablet, Rfl: 8 .  Multiple Vitamins-Minerals (CENTRUM ADULTS PO), Take by mouth., Disp: , Rfl:  .  nitroGLYCERIN (NITROSTAT) 0.4 MG SL tablet, Place 1 tablet (0.4 mg total) under the tongue every 5 (five) minutes x 3 doses as needed for chest pain., Disp: 25 tablet, Rfl: 3 .  Omega-3 Fatty Acids (FISH OIL) 1200 MG CAPS, Take by mouth., Disp: , Rfl:  .  pravastatin (PRAVACHOL) 80 MG tablet, Take 1 tablet (80 mg total) by mouth  every evening., Disp: 90 tablet, Rfl: 3 .  [DISCONTINUED] ferrous sulfate 325 (65 FE) MG tablet, Take 325 mg by mouth 2 (two) times daily.  , Disp: , Rfl:   Allergies:  Allergies  Allergen Reactions  . Propoxyphene N-Acetaminophen Swelling    tongue swelling  . Penicillins Swelling    Facial swelling  . Cortisone Rash  . Myrbetriq [Mirabegron]     Rash on face  . Pseudoephedrine Hcl Er Rash and Other (See Comments)    Face gets red    Past Medical History, Surgical history, Social history, and Family History were reviewed and updated.  Review of Systems: As above  Physical Exam:  height is 5\' 8"  (1.727 m) and weight is 170 lb (77.111 kg). Her oral temperature is 98.4 F (36.9  C). Her blood pressure is 119/76 and her pulse is 64. Her respiration is 16.   Well-developed and well-nourished white female. Her head and neck exam shows no ocular or oral lesions. She has no palpable cervical or supraclavicular lymph nodes. Lungs are clear bilaterally. Cardiac exam regular rate and rhythm with a 2/6 systolic ejection murmur. Breast exam shows right breast with no masses edema or erythema. There is no right axillary adenopathy. Left breast shows well-healed lumpectomy at the 2:00 position. There might be a little bit of erythema. The might be a little bit of swelling. There is no tenderness at the lumpectomy site. There is no masses in the left breast. There is no left axillary adenopathy.. Abdomen is soft. She's good bowel sounds. There is no palpable liver or spleen tip. Back exam shows no tenderness over the spine ribs or hips. Extremities shows no clubbing cyanosis or edema. Neurological exam shows no focal neurological deficits.  Lab Results  Component Value Date   WBC 5.7 01/07/2016   HGB 10.4* 01/07/2016   HCT 33.1* 01/07/2016   MCV 79* 01/07/2016   PLT 146 01/07/2016     Chemistry      Component Value Date/Time   NA 141 10/07/2015 0820   NA 139 07/08/2015 0855   NA 139 03/25/2015 1131   K 4.6 10/07/2015 0820   K 4.0 07/08/2015 0855   K 4.1 03/25/2015 1131   CL 104 07/08/2015 0855   CL 106 03/25/2015 1131   CO2 26 10/07/2015 0820   CO2 27 07/08/2015 0855   CO2 25 03/25/2015 1131   BUN 20.7 10/07/2015 0820   BUN 23* 07/08/2015 0855   BUN 25* 03/25/2015 1131   CREATININE 1.1 10/07/2015 0820   CREATININE 1.4* 07/08/2015 0855   CREATININE 1.24* 03/25/2015 1131      Component Value Date/Time   CALCIUM 9.7 10/07/2015 0820   CALCIUM 9.6 07/08/2015 0855   CALCIUM 9.1 03/25/2015 1131   ALKPHOS 45 11/12/2015 0930   ALKPHOS 60 10/07/2015 0820   ALKPHOS 58 07/08/2015 0855   AST 13 11/12/2015 0930   AST 13 10/07/2015 0820   AST 19 07/08/2015 0855   ALT 8  11/12/2015 0930   ALT 9 10/07/2015 0820   ALT 18 07/08/2015 0855   BILITOT 0.5 11/12/2015 0930   BILITOT 0.54 10/07/2015 0820   BILITOT 0.70 07/08/2015 0855         Impression and Plan: Kim [name] is 81 year old white female with stage IIA ductal carcinoma of the left breast. She had 4 positive lymph nodes. Her breast cancer was triple positive. She completed her adjuvant chemotherapy. She completed this in July of 2015.  We will continue her  on Femara. She will need 10 years of Femara.  I really think that she needs IV iron. I looked at her blood and the microscope. She has more microcytic and hypochromic red cells. Her MCV is decreasing. I think this probably is why she has the dizziness.  I talked to her about IV iron. We are checking her levels. I think that while she is here, we should go ahead and give her a dose. It is hard for her to come back to get treated. She relies on her husband who is not in the best of health. I think it would be easier for her to get iron now.  We will go ahead and plan to get her back in 3 months. I think everything looks okay to be seen in 3 months.  I will check levels at that time. Hopefully, we will see that she is responding nicely.   I spent about 25-30 minutes with her.  [name], MD 4/5/201710:41 AM

## 2016-01-07 NOTE — Patient Instructions (Signed)
Ferumoxytol injection What is this medicine? FERUMOXYTOL is an iron complex. Iron is used to make healthy red blood cells, which carry oxygen and nutrients throughout the body. This medicine is used to treat iron deficiency anemia in people with chronic kidney disease. This medicine may be used for other purposes; ask your health care provider or pharmacist if you have questions. What should I tell my health care provider before I take this medicine? They need to know if you have any of these conditions: -anemia not caused by low iron levels -high levels of iron in the blood -magnetic resonance imaging (MRI) test scheduled -an unusual or allergic reaction to iron, other medicines, foods, dyes, or preservatives -pregnant or trying to get pregnant -breast-feeding How should I use this medicine? This medicine is for injection into a vein. It is given by a health care professional in a hospital or clinic setting. Talk to your pediatrician regarding the use of this medicine in children. Special care may be needed. Overdosage: If you think you have taken too much of this medicine contact a poison control center or emergency room at once. NOTE: This medicine is only for you. Do not share this medicine with others. What if I miss a dose? It is important not to miss your dose. Call your doctor or health care professional if you are unable to keep an appointment. What may interact with this medicine? This medicine may interact with the following medications: -other iron products This list may not describe all possible interactions. Give your health care provider a list of all the medicines, herbs, non-prescription drugs, or dietary supplements you use. Also tell them if you smoke, drink alcohol, or use illegal drugs. Some items may interact with your medicine. What should I watch for while using this medicine? Visit your doctor or healthcare professional regularly. Tell your doctor or healthcare  professional if your symptoms do not start to get better or if they get worse. You may need blood work done while you are taking this medicine. You may need to follow a special diet. Talk to your doctor. Foods that contain iron include: whole grains/cereals, dried fruits, beans, or peas, leafy green vegetables, and organ meats (liver, kidney). What side effects may I notice from receiving this medicine? Side effects that you should report to your doctor or health care professional as soon as possible: -allergic reactions like skin rash, itching or hives, swelling of the face, lips, or tongue -breathing problems -changes in blood pressure -feeling faint or lightheaded, falls -fever or chills -flushing, sweating, or hot feelings -swelling of the ankles or feet Side effects that usually do not require medical attention (Report these to your doctor or health care professional if they continue or are bothersome.): -diarrhea -headache -nausea, vomiting -stomach pain This list may not describe all possible side effects. Call your doctor for medical advice about side effects. You may report side effects to FDA at 1-800-FDA-1088. Where should I keep my medicine? This drug is given in a hospital or clinic and will not be stored at home. NOTE: This sheet is a summary. It may not cover all possible information. If you have questions about this medicine, talk to your doctor, pharmacist, or health care provider.    2016, Elsevier/Gold Standard. (2012-05-05 15:23:36) Denosumab injection What is this medicine? DENOSUMAB (den oh sue mab) slows bone breakdown. Prolia is used to treat osteoporosis in women after menopause and in men. Xgeva is used to prevent bone fractures and other bone   problems caused by cancer bone metastases. Xgeva is also used to treat giant cell tumor of the bone. This medicine may be used for other purposes; ask your health care provider or pharmacist if you have questions. What  should I tell my health care provider before I take this medicine? They need to know if you have any of these conditions: -dental disease -eczema -infection or history of infections -kidney disease or on dialysis -low blood calcium or vitamin D -malabsorption syndrome -scheduled to have surgery or tooth extraction -taking medicine that contains denosumab -thyroid or parathyroid disease -an unusual reaction to denosumab, other medicines, foods, dyes, or preservatives -pregnant or trying to get pregnant -breast-feeding How should I use this medicine? This medicine is for injection under the skin. It is given by a health care professional in a hospital or clinic setting. If you are getting Prolia, a special MedGuide will be given to you by the pharmacist with each prescription and refill. Be sure to read this information carefully each time. For Prolia, talk to your pediatrician regarding the use of this medicine in children. Special care may be needed. For Xgeva, talk to your pediatrician regarding the use of this medicine in children. While this drug may be prescribed for children as young as 13 years for selected conditions, precautions do apply. Overdosage: If you think you have taken too much of this medicine contact a poison control center or emergency room at once. NOTE: This medicine is only for you. Do not share this medicine with others. What if I miss a dose? It is important not to miss your dose. Call your doctor or health care professional if you are unable to keep an appointment. What may interact with this medicine? Do not take this medicine with any of the following medications: -other medicines containing denosumab This medicine may also interact with the following medications: -medicines that suppress the immune system -medicines that treat cancer -steroid medicines like prednisone or cortisone This list may not describe all possible interactions. Give your health care  provider a list of all the medicines, herbs, non-prescription drugs, or dietary supplements you use. Also tell them if you smoke, drink alcohol, or use illegal drugs. Some items may interact with your medicine. What should I watch for while using this medicine? Visit your doctor or health care professional for regular checks on your progress. Your doctor or health care professional may order blood tests and other tests to see how you are doing. Call your doctor or health care professional if you get a cold or other infection while receiving this medicine. Do not treat yourself. This medicine may decrease your body's ability to fight infection. You should make sure you get enough calcium and vitamin D while you are taking this medicine, unless your doctor tells you not to. Discuss the foods you eat and the vitamins you take with your health care professional. See your dentist regularly. Brush and floss your teeth as directed. Before you have any dental work done, tell your dentist you are receiving this medicine. Do not become pregnant while taking this medicine or for 5 months after stopping it. Women should inform their doctor if they wish to become pregnant or think they might be pregnant. There is a potential for serious side effects to an unborn child. Talk to your health care professional or pharmacist for more information. What side effects may I notice from receiving this medicine? Side effects that you should report to your doctor or health   care professional as soon as possible: -allergic reactions like skin rash, itching or hives, swelling of the face, lips, or tongue -breathing problems -chest pain -fast, irregular heartbeat -feeling faint or lightheaded, falls -fever, chills, or any other sign of infection -muscle spasms, tightening, or twitches -numbness or tingling -skin blisters or bumps, or is dry, peels, or red -slow healing or unexplained pain in the mouth or jaw -unusual bleeding  or bruising Side effects that usually do not require medical attention (Report these to your doctor or health care professional if they continue or are bothersome.): -muscle pain -stomach upset, gas This list may not describe all possible side effects. Call your doctor for medical advice about side effects. You may report side effects to FDA at 1-800-FDA-1088. Where should I keep my medicine? This medicine is only given in a clinic, doctor's office, or other health care setting and will not be stored at home. NOTE: This sheet is a summary. It may not cover all possible information. If you have questions about this medicine, talk to your doctor, pharmacist, or health care provider.    2016, Elsevier/Gold Standard. (2012-03-20 12:37:47)  

## 2016-01-19 ENCOUNTER — Other Ambulatory Visit: Payer: Medicare Other

## 2016-01-20 ENCOUNTER — Other Ambulatory Visit: Payer: Medicare Other

## 2016-01-27 ENCOUNTER — Other Ambulatory Visit: Payer: Self-pay | Admitting: Family Medicine

## 2016-01-27 NOTE — Telephone Encounter (Signed)
I left a voice message for pt to have TSH checked, its due and order is in computer.

## 2016-02-10 ENCOUNTER — Other Ambulatory Visit: Payer: Self-pay | Admitting: Hematology & Oncology

## 2016-03-25 ENCOUNTER — Other Ambulatory Visit (INDEPENDENT_AMBULATORY_CARE_PROVIDER_SITE_OTHER): Payer: Medicare Other

## 2016-03-25 DIAGNOSIS — E039 Hypothyroidism, unspecified: Secondary | ICD-10-CM | POA: Diagnosis not present

## 2016-03-25 LAB — TSH: TSH: 0.84 u[IU]/mL (ref 0.35–4.50)

## 2016-03-31 ENCOUNTER — Other Ambulatory Visit: Payer: Self-pay | Admitting: *Deleted

## 2016-03-31 DIAGNOSIS — I421 Obstructive hypertrophic cardiomyopathy: Secondary | ICD-10-CM

## 2016-03-31 DIAGNOSIS — I422 Other hypertrophic cardiomyopathy: Secondary | ICD-10-CM

## 2016-03-31 DIAGNOSIS — E785 Hyperlipidemia, unspecified: Secondary | ICD-10-CM

## 2016-04-01 ENCOUNTER — Telehealth: Payer: Self-pay | Admitting: Cardiology

## 2016-04-01 ENCOUNTER — Other Ambulatory Visit: Payer: Self-pay | Admitting: *Deleted

## 2016-04-01 DIAGNOSIS — I2510 Atherosclerotic heart disease of native coronary artery without angina pectoris: Secondary | ICD-10-CM

## 2016-04-01 DIAGNOSIS — I422 Other hypertrophic cardiomyopathy: Secondary | ICD-10-CM

## 2016-04-01 DIAGNOSIS — E785 Hyperlipidemia, unspecified: Secondary | ICD-10-CM

## 2016-04-01 NOTE — Telephone Encounter (Signed)
Spoke with pt about  appt with Dr Aundra Dubin

## 2016-04-01 NOTE — Telephone Encounter (Signed)
Follow Up:; ° ° °Returning your call. °

## 2016-04-07 ENCOUNTER — Other Ambulatory Visit (HOSPITAL_BASED_OUTPATIENT_CLINIC_OR_DEPARTMENT_OTHER): Payer: Medicare Other

## 2016-04-07 ENCOUNTER — Encounter: Payer: Self-pay | Admitting: Hematology & Oncology

## 2016-04-07 ENCOUNTER — Ambulatory Visit: Payer: Medicare Other

## 2016-04-07 ENCOUNTER — Ambulatory Visit (HOSPITAL_BASED_OUTPATIENT_CLINIC_OR_DEPARTMENT_OTHER): Payer: Medicare Other | Admitting: Hematology & Oncology

## 2016-04-07 ENCOUNTER — Other Ambulatory Visit (INDEPENDENT_AMBULATORY_CARE_PROVIDER_SITE_OTHER): Payer: Medicare Other | Admitting: *Deleted

## 2016-04-07 VITALS — BP 139/62 | HR 63 | Temp 97.9°F | Resp 16 | Ht 68.0 in | Wt 167.0 lb

## 2016-04-07 DIAGNOSIS — C50412 Malignant neoplasm of upper-outer quadrant of left female breast: Secondary | ICD-10-CM | POA: Diagnosis not present

## 2016-04-07 DIAGNOSIS — E785 Hyperlipidemia, unspecified: Secondary | ICD-10-CM | POA: Diagnosis not present

## 2016-04-07 DIAGNOSIS — C779 Secondary and unspecified malignant neoplasm of lymph node, unspecified: Secondary | ICD-10-CM | POA: Diagnosis not present

## 2016-04-07 DIAGNOSIS — D500 Iron deficiency anemia secondary to blood loss (chronic): Secondary | ICD-10-CM

## 2016-04-07 DIAGNOSIS — I2510 Atherosclerotic heart disease of native coronary artery without angina pectoris: Secondary | ICD-10-CM

## 2016-04-07 DIAGNOSIS — K909 Intestinal malabsorption, unspecified: Secondary | ICD-10-CM

## 2016-04-07 DIAGNOSIS — I422 Other hypertrophic cardiomyopathy: Secondary | ICD-10-CM

## 2016-04-07 LAB — BASIC METABOLIC PANEL
BUN: 21 mg/dL (ref 7–25)
CO2: 24 mmol/L (ref 20–31)
Calcium: 9.2 mg/dL (ref 8.6–10.4)
Chloride: 106 mmol/L (ref 98–110)
Creat: 0.97 mg/dL — ABNORMAL HIGH (ref 0.60–0.93)
Glucose, Bld: 95 mg/dL (ref 65–99)
Potassium: 4.2 mmol/L (ref 3.5–5.3)
Sodium: 138 mmol/L (ref 135–146)

## 2016-04-07 LAB — LIPID PANEL
Cholesterol: 210 mg/dL — ABNORMAL HIGH (ref 125–200)
HDL: 41 mg/dL — ABNORMAL LOW (ref >=46)
LDL Cholesterol: 143 mg/dL — ABNORMAL HIGH (ref <130)
Total CHOL/HDL Ratio: 5.1 Ratio — ABNORMAL HIGH (ref <=5.0)
Triglycerides: 129 mg/dL (ref <150)
VLDL: 26 mg/dL (ref <30)

## 2016-04-07 LAB — CBC WITH DIFFERENTIAL (CANCER CENTER ONLY)
BASO#: 0 10*3/uL (ref 0.0–0.2)
BASO%: 0.8 % (ref 0.0–2.0)
EOS%: 3.3 % (ref 0.0–7.0)
Eosinophils Absolute: 0.2 10*3/uL (ref 0.0–0.5)
HCT: 38.8 % (ref 34.8–46.6)
HGB: 12.8 g/dL (ref 11.6–15.9)
LYMPH#: 1.1 10*3/uL (ref 0.9–3.3)
LYMPH%: 22 % (ref 14.0–48.0)
MCH: 30.5 pg (ref 26.0–34.0)
MCHC: 33 g/dL (ref 32.0–36.0)
MCV: 93 fL (ref 81–101)
MONO#: 0.4 10*3/uL (ref 0.1–0.9)
MONO%: 8.5 % (ref 0.0–13.0)
NEUT#: 3.2 10*3/uL (ref 1.5–6.5)
NEUT%: 65.4 % (ref 39.6–80.0)
Platelets: 132 10*3/uL — ABNORMAL LOW (ref 145–400)
RBC: 4.19 10*6/uL (ref 3.70–5.32)
RDW: 14.5 % (ref 11.1–15.7)
WBC: 4.8 10*3/uL (ref 3.9–10.0)

## 2016-04-07 LAB — IRON AND TIBC
%SAT: 15 % — ABNORMAL LOW (ref 21–57)
Iron: 55 ug/dL (ref 41–142)
TIBC: 369 ug/dL (ref 236–444)
UIBC: 314 ug/dL (ref 120–384)

## 2016-04-07 LAB — HEPATIC FUNCTION PANEL
ALT: 13 U/L (ref 6–29)
AST: 12 U/L (ref 10–35)
Albumin: 4 g/dL (ref 3.6–5.1)
Alkaline Phosphatase: 36 U/L (ref 33–130)
Bilirubin, Direct: 0.1 mg/dL (ref <=0.2)
Indirect Bilirubin: 0.6 mg/dL (ref 0.2–1.2)
Total Bilirubin: 0.7 mg/dL (ref 0.2–1.2)
Total Protein: 6.2 g/dL (ref 6.1–8.1)

## 2016-04-07 LAB — CBC WITH DIFFERENTIAL/PLATELET
Basophils Absolute: 42 cells/uL (ref 0–200)
Basophils Relative: 1 %
Eosinophils Absolute: 126 cells/uL (ref 15–500)
Eosinophils Relative: 3 %
HCT: 36.3 % (ref 35.0–45.0)
Hemoglobin: 12 g/dL (ref 11.7–15.5)
Lymphocytes Relative: 27 %
Lymphs Abs: 1134 cells/uL (ref 850–3900)
MCH: 29.4 pg (ref 27.0–33.0)
MCHC: 33.1 g/dL (ref 32.0–36.0)
MCV: 89 fL (ref 80.0–100.0)
MPV: 11.2 fL (ref 7.5–12.5)
Monocytes Absolute: 462 cells/uL (ref 200–950)
Monocytes Relative: 11 %
Neutro Abs: 2436 cells/uL (ref 1500–7800)
Neutrophils Relative %: 58 %
Platelets: 135 10*3/uL — ABNORMAL LOW (ref 140–400)
RBC: 4.08 MIL/uL (ref 3.80–5.10)
RDW: 15.2 % — ABNORMAL HIGH (ref 11.0–15.0)
WBC: 4.2 10*3/uL (ref 3.8–10.8)

## 2016-04-07 LAB — FERRITIN: Ferritin: 27 ng/ml (ref 9–269)

## 2016-04-07 LAB — CMP (CANCER CENTER ONLY)
ALT(SGPT): 17 U/L (ref 10–47)
AST: 20 U/L (ref 11–38)
Albumin: 3.8 g/dL (ref 3.3–5.5)
Alkaline Phosphatase: 42 U/L (ref 26–84)
BUN, Bld: 20 mg/dL (ref 7–22)
CO2: 26 mEq/L (ref 18–33)
Calcium: 9.1 mg/dL (ref 8.0–10.3)
Chloride: 104 mEq/L (ref 98–108)
Creat: 1.2 mg/dl (ref 0.6–1.2)
Glucose, Bld: 94 mg/dL (ref 73–118)
Potassium: 4.1 mEq/L (ref 3.3–4.7)
Sodium: 137 mEq/L (ref 128–145)
Total Bilirubin: 1 mg/dl (ref 0.20–1.60)
Total Protein: 6.6 g/dL (ref 6.4–8.1)

## 2016-04-07 LAB — TSH: TSH: 1.22 mIU/L

## 2016-04-07 NOTE — Progress Notes (Signed)
Hematology and Oncology Follow Up Visit  AAIRAH FAYSON [id number] Jan 29, 1935 81 y.o. 04/07/2016   Principle Diagnosis:  Stage IIA (T1N1M0) adenocarcinoma of the left breast-triple positive Iron deficiency anemia secondary to blood loss  Current Therapy:   Status post cycle 4 of Abraxane/Herceptin Maintenance Herceptin q 3wk - finish 6/21 Prolia 60gm sq every 6 months - due in March 2017 Femara 2.5 mg by mouth daily  Feraheme as needed-dose given today     Interim History:  Ms.  Pontarelli is back for followup. She is still having problems with cholesterol medicine. She has some dizziness. She has myalgias. She really wants to stop taking cholesterol medicine. She wants try taking some fish oil. I told her this would be okay. I also told her about red rice yeast. This also is used for cholesterol.  We gave her some IV iron we saw her last. This helped her out quite a bit.   Her last mammogram was done back in January.  She's had no fever. She's had no cough. She's had no cardiac issues.  She has a performance status of ECOG 1.  Medications:  Current outpatient prescriptions:  .  acetaminophen (TYLENOL) 650 MG CR tablet, Take 650 mg by mouth every 8 (eight) hours as needed for pain. , Disp: , Rfl:  .  acidophilus (RISAQUAD) CAPS capsule, Take 1 capsule by mouth daily. Reported on 10/22/2015, Disp: , Rfl:  .  Ascorbic Acid (VITAMIN C) 1000 MG tablet, Take 1,000 mg by mouth daily., Disp: , Rfl:  .  aspirin EC 81 MG EC tablet, Take 1 tablet (81 mg total) by mouth daily., Disp: , Rfl:  .  Calcium Citrate-Vitamin D (CITRACAL MAXIMUM PO), Take 1 tablet by mouth daily. , Disp: , Rfl:  .  cetirizine (ZYRTEC) 10 MG tablet, Take 10 mg by mouth daily as needed for allergies., Disp: , Rfl:  .  Coenzyme Q10 200 MG TABS, One tablet by mouth daily, Disp: , Rfl: 0 .  Cyanocobalamin (VITAMIN B-12) 2500 MCG SUBL, Place under the tongue as needed., Disp: , Rfl:  .  furosemide (LASIX) 20 MG tablet, Take 1  tablet (20 mg total) by mouth daily. Take 1 tablet (20 mg total) every day x 3 days then take once daily as needed, Disp: 30 tablet, Rfl: 0 .  gabapentin (NEURONTIN) 100 MG capsule, Take 1 capsule (100 mg total) by mouth 3 (three) times daily., Disp: 270 capsule, Rfl: 0 .  letrozole (FEMARA) 2.5 MG tablet, TAKE 1 TABLET BY MOUTH ONCE A DAY, Disp: 30 tablet, Rfl: 3 .  levothyroxine (SYNTHROID, LEVOTHROID) 112 MCG tablet, TAKE 1 TABLET BY MOUTH ONCE A DAY, Disp: 90 tablet, Rfl: 0 .  lidocaine-prilocaine (EMLA) cream, Apply topically as needed., Disp: 30 g, Rfl: 6 .  Loperamide HCl (IMODIUM PO), Take by mouth as needed., Disp: , Rfl:  .  metoprolol succinate (TOPROL-XL) 50 MG 24 hr tablet, TAKE 1 TABLET BY MOUTH TWICE A DAY ** TAKE WITH OR IMMEDIATELY FOLLOWING A MEAL **, Disp: 60 tablet, Rfl: 8 .  Multiple Vitamins-Minerals (CENTRUM ADULTS PO), Take by mouth., Disp: , Rfl:  .  nitroGLYCERIN (NITROSTAT) 0.4 MG SL tablet, Place 1 tablet (0.4 mg total) under the tongue every 5 (five) minutes x 3 doses as needed for chest pain., Disp: 25 tablet, Rfl: 3 .  Omega-3 Fatty Acids (FISH OIL) 1200 MG CAPS, Take by mouth., Disp: , Rfl:  .  pravastatin (PRAVACHOL) 80 MG tablet, Take 1 tablet (80  mg total) by mouth every evening., Disp: 90 tablet, Rfl: 3 .  [DISCONTINUED] ferrous sulfate 325 (65 FE) MG tablet, Take 325 mg by mouth 2 (two) times daily.  , Disp: , Rfl:   Allergies:  Allergies  Allergen Reactions  . Propoxyphene N-Acetaminophen Swelling    tongue swelling  . Penicillins Swelling    Facial swelling  . Cortisone Rash  . Myrbetriq [Mirabegron]     Rash on face  . Pseudoephedrine Hcl Er Rash and Other (See Comments)    Face gets red    Past Medical History, Surgical history, Social history, and Family History were reviewed and updated.  Review of Systems: As above  Physical Exam:  height is 5\' 8"  (1.727 m) and weight is 167 lb (75.751 kg). Her oral temperature is 97.9 F (36.6 C). Her  blood pressure is 139/62 and her pulse is 63. Her respiration is 16.   Well-developed and well-nourished white female. Her head and neck exam shows no ocular or oral lesions. She has no palpable cervical or supraclavicular lymph nodes. Lungs are clear bilaterally. Cardiac exam regular rate and rhythm with a 2/6 systolic ejection murmur. Breast exam shows right breast with no masses edema or erythema. There is no right axillary adenopathy. Left breast shows well-healed lumpectomy at the 2:00 position. There might be a little bit of erythema. The might be a little bit of swelling. There is no tenderness at the lumpectomy site. There is no masses in the left breast. There is no left axillary adenopathy.. Abdomen is soft. She's good bowel sounds. There is no palpable liver or spleen tip. Back exam shows no tenderness over the spine ribs or hips. Extremities shows no clubbing cyanosis or edema. Neurological exam shows no focal neurological deficits.  Lab Results  Component Value Date   WBC 4.8 04/07/2016   HGB 12.8 04/07/2016   HCT 38.8 04/07/2016   MCV 93 04/07/2016   PLT 132* 04/07/2016     Chemistry      Component Value Date/Time   NA 137 04/07/2016 0917   NA 141 10/07/2015 0820   NA 139 03/25/2015 1131   K 4.1 04/07/2016 0917   K 4.6 10/07/2015 0820   K 4.1 03/25/2015 1131   CL 104 04/07/2016 0917   CL 106 03/25/2015 1131   CO2 26 04/07/2016 0917   CO2 26 10/07/2015 0820   CO2 25 03/25/2015 1131   BUN 20 04/07/2016 0917   BUN 20.7 10/07/2015 0820   BUN 25* 03/25/2015 1131   CREATININE 1.2 04/07/2016 0917   CREATININE 1.1 10/07/2015 0820   CREATININE 1.24* 03/25/2015 1131      Component Value Date/Time   CALCIUM 9.1 04/07/2016 0917   CALCIUM 9.7 10/07/2015 0820   CALCIUM 9.1 03/25/2015 1131   ALKPHOS 42 04/07/2016 0917   ALKPHOS 45 11/12/2015 0930   ALKPHOS 60 10/07/2015 0820   AST 20 04/07/2016 0917   AST 13 11/12/2015 0930   AST 13 10/07/2015 0820   ALT 17 04/07/2016 0917    ALT 8 11/12/2015 0930   ALT 9 10/07/2015 0820   BILITOT 1.00 04/07/2016 0917   BILITOT 0.5 11/12/2015 0930   BILITOT 0.54 10/07/2015 0820         Impression and Plan: Ms. Campusano is 81 year old white female with stage IIA ductal carcinoma of the left breast. She had 4 positive lymph nodes. Her breast cancer was triple positive. She completed her adjuvant chemotherapy. She completed this in July of 2015.  We will continue her on Femara. She will need 10 years of Femara.  We will that her hemoglobin is better. Denies that we gave her helped.  I'm not sure why she has her symptoms. She sees her cardiologist a couple weeks.  We'll probably get her back here in 3 more months. She was come back in a year. I told her that she needs 3 more months follow-up. We see her back, we will do the Prolia.  I spent about 25-30 minutes with her and her husband.  [name], MD 7/5/201710:31 AM

## 2016-04-08 ENCOUNTER — Other Ambulatory Visit: Payer: Self-pay | Admitting: *Deleted

## 2016-04-08 LAB — RETICULOCYTES: Reticulocyte Count: 1.3 % (ref 0.6–2.6)

## 2016-04-09 ENCOUNTER — Telehealth: Payer: Self-pay | Admitting: Nurse Practitioner

## 2016-04-09 NOTE — Telephone Encounter (Addendum)
LVM on personal machine and instructed to give office a call. ----- Message from [name], MD sent at 04/07/2016 12:10 PM EDT ----- Call - your iron is still on the low side!!  This could be making you a little dizzy.  We will give you another dose of IV iron and this should make you feel better!! Please set this up in 1-2 weeks!!  pete

## 2016-04-12 ENCOUNTER — Other Ambulatory Visit: Payer: Self-pay | Admitting: *Deleted

## 2016-04-12 ENCOUNTER — Telehealth: Payer: Self-pay | Admitting: Family Medicine

## 2016-04-12 ENCOUNTER — Ambulatory Visit (HOSPITAL_BASED_OUTPATIENT_CLINIC_OR_DEPARTMENT_OTHER): Payer: Medicare Other

## 2016-04-12 VITALS — BP 119/57 | HR 70 | Temp 98.1°F | Resp 20

## 2016-04-12 DIAGNOSIS — D500 Iron deficiency anemia secondary to blood loss (chronic): Secondary | ICD-10-CM

## 2016-04-12 MED ORDER — SODIUM CHLORIDE 0.9 % IV SOLN
Freq: Once | INTRAVENOUS | Status: AC
Start: 2016-04-12 — End: 2016-04-12
  Administered 2016-04-12: 14:00:00 via INTRAVENOUS

## 2016-04-12 MED ORDER — FERUMOXYTOL INJECTION 510 MG/17 ML
510.0000 mg | Freq: Once | INTRAVENOUS | Status: AC
  Administered 2016-04-12: 510 mg via INTRAVENOUS
  Filled 2016-04-12: qty 17

## 2016-04-12 NOTE — Telephone Encounter (Addendum)
error 

## 2016-04-12 NOTE — Patient Instructions (Signed)

## 2016-04-13 ENCOUNTER — Ambulatory Visit: Payer: Medicare Other

## 2016-04-20 ENCOUNTER — Telehealth: Payer: Self-pay | Admitting: Family Medicine

## 2016-04-20 NOTE — Telephone Encounter (Signed)
Pt would like to have TSH results from 03/25/16.

## 2016-04-21 ENCOUNTER — Ambulatory Visit (INDEPENDENT_AMBULATORY_CARE_PROVIDER_SITE_OTHER): Payer: Medicare Other | Admitting: Cardiology

## 2016-04-21 ENCOUNTER — Encounter: Payer: Self-pay | Admitting: Cardiology

## 2016-04-21 VITALS — BP 116/64 | HR 67 | Ht 68.0 in | Wt 162.0 lb

## 2016-04-21 DIAGNOSIS — I421 Obstructive hypertrophic cardiomyopathy: Secondary | ICD-10-CM | POA: Diagnosis not present

## 2016-04-21 DIAGNOSIS — I2510 Atherosclerotic heart disease of native coronary artery without angina pectoris: Secondary | ICD-10-CM | POA: Diagnosis not present

## 2016-04-21 DIAGNOSIS — E785 Hyperlipidemia, unspecified: Secondary | ICD-10-CM

## 2016-04-21 NOTE — Patient Instructions (Addendum)
Medication Instructions:  Your physician recommends that you continue on your current medications as directed. Please refer to the Current Medication list given to you today.   You can use Magnesium Oxide 400mg  for cramps.   Labwork:  None  Testing/Procedures: none  Follow-Up: Your physician wants you to follow-up in: 6 months with Dr Aundra Dubin. (January 2018). You will receive a reminder letter in the mail two months in advance. If you don't receive a letter, please call our office to schedule the follow-up appointment.   Any Other Special Instructions Will Be Listed Below (If Applicable).  You have been referred to the [facility] for evaluation for PCSK 9 Inhibitor, injectable cholesterol medication.    If you need a refill on your cardiac medications before your next appointment, please call your pharmacy.

## 2016-04-22 NOTE — Progress Notes (Signed)
Patient ID: Kim [name], female   DOB: 25-Apr-1935, 81 y.o.   MRN: [id number] PCP: Dr. Sarajane Jews Oncologist: Dr. Marin Olp  81 yo with history of CAD and HOCM presents for cardiology evaluation given use of Herceptin with her breast cancer chemo.  Patient is s/p lumpectomy with lymph node biopsy in 2/15.  4/10 nodes positive.  She was started on docetaxol/carboplatin/Herceptin in 3/15 with plan for 6 cycles chemo. She had a tough time with her 1st chemotherapy session and felt very weak afterwards.  Shortly after, she developed unstable angina and ended up getting a DES to the mid RCA in 3/15.  Patient additionally has a history of HOCM.  This has been recognized on prior echoes.  She has severe asymmetric basal septal hypertrophy and SAM with LVOT gradient peak 58 mmHg on echo in 3/15 along with moderate MR.  Repeat echo in 7/15 showed LVOT gradient down to 30 mmHg on higher beta blocker.  Repeat echo in 11/15 showed no significant LVOT gradient but SAM still present.  Echo (8/16) showed asymmetric septal hypertrophy, No SAM, no significant LVOT gradient.    She returns for followup today.  She has not been able to tolerate any statin due to myalgias.  Overall, she is doing well.  No chest pain.  No dyspnea on flat ground, mild dyspnea walking up hills.  Bilateral knee pain is limiting.  No lightheadedness or syncope.  No orthopnea/PND.  Gets some cramps in her knees and hands at night. Weight is down 11 lbs.    Labs (3/15): K 4.5, creatinine 0.84, LDL 91, HDL 46 Labs (5/15): K 3.9, creatinine 1.1 Labs (12/15): LDL 80, HDL 28, hemoglobin 10.9 Labs (1/16): K 3.7, creatinine 0.8 Labs (2/16): K 3.6, creatinine 1.2, HCT 34.4 Labs (6/16): K 4.1, creatinine 1.24 Labs (7/17): K 4.1, creatinine 1.2, HCT 38.8, LDL 143  ECG: NSR, PACs, lateral T wave inversions  PMH: 1. CAD: Unstable angina 3/15 with LHC showing 99% mRCA stenosis, treated with DES to mRCA.  2. Hypothyroidism 3. Diverticulosis 4. HTN 5. H/o  TAH/BSO 6. Breast cancer: s/p lumpectomy with lymph node biopsy in 2/15.  4/10 nodes positive.  She was started on docetaxol/carboplatin/Herceptin in 3/15 with plan for 6 cycles chemo.  7. Hypertrophic obstructive cardiomyopathy: Echo (3/15) with severe focal basal septal hypertrophy (22 mm), narrow LV outflow tract with mitral valve SAM and 58 mmHg peak LVOT resting gradient, EF 60-65%, moderate MR, moderate LAE, normal RV, lateral s' 10.4 cm/sec.  Patient says that her 2 grown sons has had echoes to screen for HOCM.  Echo (4/15) with EF 65-70%, severe focal basal septal hypertrophy, LVOT gradient 33 mmHg, SAM was present with moderate MR, normal RV size and systolic function, lateral S' 10.6, GLS -17.5%.  Cardiac MRI (5/15) with EF 65%, moderate asymmetric septal hypertrophy, systolic anterior motion of the mitral valve with moderate MR, there was no delayed enhancement.  Echo (7/15) with EF 65-70%, moderate ASH, peak LVOT gradient 30 mmHg, SAM with moderate MR, moderate to severe LAE.  Echo (11/15) with EF 55-60%, GLS -15%, no significant LVOT gradient, systolic anterior motion of the mitral valve with mild MR, normal RV size and systolic function. Echo (4/14) with EF 55-60%, severe asymmetric septal hypertrophy, LVOT gradient 20 mmHg, mild-moderate MR, GLS -18%.  - Echo (8/16): EF 60-65%, asymmetric septal hypertrophy, no significant LV outflow tract gradient, mild MR.   8. Hyperlipidemia: Myalgias with atorvastatin  SH: Married, 2 children, retired, nonsmoker.   FH:  No family history of HOCM or sudden death.  There is a family history of CAD.   ROS: All systems reviewed and negative except as per HPI.    Current Outpatient Prescriptions  Medication Sig Dispense Refill  . acetaminophen (TYLENOL) 650 MG CR tablet Take 650 mg by mouth every 8 (eight) hours as needed for pain.     . Ascorbic Acid (VITAMIN C) 1000 MG tablet Take 1,000 mg by mouth daily.    Marland Kitchen aspirin EC 81 MG EC tablet Take 1 tablet  (81 mg total) by mouth daily.    . Calcium Citrate-Vitamin D (CITRACAL MAXIMUM PO) Take 1 tablet by mouth daily.     . cetirizine (ZYRTEC) 10 MG tablet Take 10 mg by mouth daily as needed for allergies.    . Coenzyme Q10 200 MG TABS One tablet by mouth daily  0  . Cyanocobalamin (VITAMIN B-12) 2500 MCG SUBL Place under the tongue as needed.    . furosemide (LASIX) 20 MG tablet Take 1 tablet (20 mg total) by mouth daily. Take 1 tablet (20 mg total) every day x 3 days then take once daily as needed 30 tablet 0  . gabapentin (NEURONTIN) 100 MG capsule Take 1 capsule (100 mg total) by mouth 3 (three) times daily. 270 capsule 0  . letrozole (FEMARA) 2.5 MG tablet TAKE 1 TABLET BY MOUTH ONCE A DAY 30 tablet 3  . levothyroxine (SYNTHROID, LEVOTHROID) 112 MCG tablet TAKE 1 TABLET BY MOUTH ONCE A DAY 90 tablet 0  . lidocaine-prilocaine (EMLA) cream Apply topically as needed. 30 g 6  . Loperamide HCl (IMODIUM PO) Take by mouth as needed.    . metoprolol succinate (TOPROL-XL) 50 MG 24 hr tablet TAKE 1 TABLET BY MOUTH TWICE A DAY ** TAKE WITH OR IMMEDIATELY FOLLOWING A MEAL ** 60 tablet 8  . Multiple Vitamins-Minerals (CENTRUM ADULTS PO) Take by mouth.    . nitroGLYCERIN (NITROSTAT) 0.4 MG SL tablet Place 1 tablet (0.4 mg total) under the tongue every 5 (five) minutes x 3 doses as needed for chest pain. 25 tablet 3  . [DISCONTINUED] ferrous sulfate 325 (65 FE) MG tablet Take 325 mg by mouth 2 (two) times daily.      . [DISCONTINUED] pravastatin (PRAVACHOL) 80 MG tablet Take 1 tablet (80 mg total) by mouth every evening. 90 tablet 3   No current facility-administered medications for this visit.   BP 116/64 mmHg  Pulse 67  Ht 5\' 8"  (1.727 m)  Wt 162 lb (73.483 kg)  BMI 24.64 kg/m2  LMP 10/19/1991 General: NAD Neck: No JVD, no thyromegaly or thyroid nodule.  Lungs: Clear to auscultation bilaterally with normal respiratory effort. CV: Nondisplaced PMI.  Heart regular S1/S2, no S3/S4, 2/6 SEM RUSB.  No  peripheral edema.  No carotid bruit.  Normal pedal pulses.  Abdomen: Soft, nontender, no hepatosplenomegaly, no distention.  Skin: Intact without lesions or rashes.  Neurologic: Alert and oriented x 3.  Psych: Normal affect. Extremities: No clubbing or cyanosis.   Assessment/Plan: 1. CAD: Status post PCI for unstable angina in 3/15 with DES to [facility]. She is now off Plavix.  - Continue ASA 81 and Toprol XL.     - Unable to tolerate statins.  2. Hyperlipidemia: Myalgias with Crestor, Lipitor, and pravastatin.  I am going to refer her to lipid clinic to see if we can get her on Repatha.  3. Hypertrophic obstructive cardiomyopathy: No family history of HOCM or sudden death (interestingly, her husband has  HOCM).  Most recent echo in 8/16 did not show a significant LVOT gradient.  She continues on Toprol XL.  Cardiac MRI in 5/15 showed no delayed enhancement.  - Continue current Toprol XL.  - Per patient, her 2 sons (in their 43s) have had screening echoes to look for hypertrophic cardiomyopathy and did not show signs of HOCM.   4. Nocturnal cramps: Try magnesium oxide supplement.      [name] 04/22/2016

## 2016-04-22 NOTE — Telephone Encounter (Signed)
I spoke with pt and went over results from below date.

## 2016-04-23 ENCOUNTER — Ambulatory Visit (INDEPENDENT_AMBULATORY_CARE_PROVIDER_SITE_OTHER): Payer: Medicare Other | Admitting: Pharmacist

## 2016-04-23 DIAGNOSIS — E785 Hyperlipidemia, unspecified: Secondary | ICD-10-CM | POA: Diagnosis not present

## 2016-04-23 MED ORDER — EVOLOCUMAB 140 MG/ML ~~LOC~~ SOAJ: 140.0000 mg | SUBCUTANEOUS | Status: DC

## 2016-04-23 NOTE — Progress Notes (Signed)
Patient ID: Kim [name]                 DOB: 02-11-1935                    MRN: [id number]     HPI: Kim [name] is a 81 y.o. female patient referred to lipid clinic by Dr. Aundra Dubin. PMH is significant for CAD, unstable angina, DES to mid RCA in 12/2013 d/t 99% stenosis of mRCA, hypertrophic obstructive cardiomyopathy, HTN, and hypothyroidism. She has a history of statin intolerance and presents to lipid clinic for further management.  Pt reports that she has previously tried multiple doses of pravastatin, atorvastatin, and rosuvastatin. She experienced muscle aches in both legs each time. They appeared after taking statins for a few weeks and symptoms resolved within a week of drug discontinuation each time.  Current Medications: none Intolerances: pravastatin 40mg  and 80mg  daily (2016-2017), atorvastatin 10mg  and 40mg  daily (2015), rosuvastatin 10mg  daily (2015-2016) Risk Factors: CAD s/p PCI, HTN, age LDL goal: 70mg /dL  Diet: Breakfast: oatmeal or cheerios, or 1 egg and bacon (~3x a week). Lunch: tomato sandwich. Dinner: grilled or baked meat (pork, chicken, beef) and vegetables. Occasional dessert - cake or pie.   Exercise: Has bad knees but tries to walk a few times a week.  Family History: Non significant for cardiac disease.  Social History: Patient reports that she has never smoked, does not drink alcohol, or use illicit drugs.  Labs: 04/07/16: TC 210, TG 129, HDL 41, LDL 143, LFTs wnl (no therapy)  Past Medical History  Diagnosis Date  . Allergy   . Allergic rhinitis   . Hypothyroidism   . Urinary tract infection   . Diverticulosis   . Hx of adenomatous colonic polyps 02/1999  . Tubulovillous adenoma of colon 12/2009    with HGD  . Low back pain     gets ESI from Dr. Rennis Harding  . Hypertension   . HOH (hard of hearing)     bilateral, wears hearing aids  . Wears hearing aid     left and right   . CAD (coronary artery disease)     a. 12/2013 Cath/PCI: LM nl, LAD 20p,  72m, 30d, D1 30p, LCX 20p, 47m, OM1 20, Mo2 40p, RCA 30p, 98m (4.0x16 Promus Premier DES), 20d, RPL 30, RPDA 70p, 58m, EF 65-70%.  . Hyperlipidemia   . S/P radiation therapy 05/14/2014-06/26/2014    1) Left Breast  / 50 Gy in 25 fractions, 2) Left Supraclavicular fossa/ 47.5 Gy in 25 fractions, 3) Left Posterior Axillary boost / 4.825 Gy in 25 fractions, 4) Left Breast boost / 10 Gy in 5 fractions   . SVD (spontaneous vaginal delivery)     x 2  . Arthritis     hands  . Breast cancer of upper-outer quadrant of left female breast ([place]) 11/08/13    finished chemo and radiation 2015  . Myocardial infarction ([place]) 12/2013    very mild-with first chemo -placed stent x1  . Iron deficiency anemia due to chronic blood loss 01/07/2016  . Malabsorption of iron 01/07/2016    Current Outpatient Prescriptions on File Prior to Visit  Medication Sig Dispense Refill  . acetaminophen (TYLENOL) 650 MG CR tablet Take 650 mg by mouth every 8 (eight) hours as needed for pain.     . Ascorbic Acid (VITAMIN C) 1000 MG tablet Take 1,000 mg by mouth daily.    Marland Kitchen aspirin EC 81 MG EC  tablet Take 1 tablet (81 mg total) by mouth daily.    . Calcium Citrate-Vitamin D (CITRACAL MAXIMUM PO) Take 1 tablet by mouth daily.     . cetirizine (ZYRTEC) 10 MG tablet Take 10 mg by mouth daily as needed for allergies.    . Coenzyme Q10 200 MG TABS One tablet by mouth daily  0  . Cyanocobalamin (VITAMIN B-12) 2500 MCG SUBL Place under the tongue as needed.    . furosemide (LASIX) 20 MG tablet Take 1 tablet (20 mg total) by mouth daily. Take 1 tablet (20 mg total) every day x 3 days then take once daily as needed 30 tablet 0  . gabapentin (NEURONTIN) 100 MG capsule Take 1 capsule (100 mg total) by mouth 3 (three) times daily. 270 capsule 0  . letrozole (FEMARA) 2.5 MG tablet TAKE 1 TABLET BY MOUTH ONCE A DAY 30 tablet 3  . levothyroxine (SYNTHROID, LEVOTHROID) 112 MCG tablet TAKE 1 TABLET BY MOUTH ONCE A DAY 90 tablet 0  .  lidocaine-prilocaine (EMLA) cream Apply topically as needed. 30 g 6  . Loperamide HCl (IMODIUM PO) Take by mouth as needed.    . metoprolol succinate (TOPROL-XL) 50 MG 24 hr tablet TAKE 1 TABLET BY MOUTH TWICE A DAY ** TAKE WITH OR IMMEDIATELY FOLLOWING A MEAL ** 60 tablet 8  . Multiple Vitamins-Minerals (CENTRUM ADULTS PO) Take by mouth.    . nitroGLYCERIN (NITROSTAT) 0.4 MG SL tablet Place 1 tablet (0.4 mg total) under the tongue every 5 (five) minutes x 3 doses as needed for chest pain. 25 tablet 3  . [DISCONTINUED] ferrous sulfate 325 (65 FE) MG tablet Take 325 mg by mouth 2 (two) times daily.      . [DISCONTINUED] pravastatin (PRAVACHOL) 80 MG tablet Take 1 tablet (80 mg total) by mouth every evening. 90 tablet 3   No current facility-administered medications on file prior to visit.    Allergies  Allergen Reactions  . Propoxyphene N-Acetaminophen Swelling    tongue swelling  . Atorvastatin Other (See Comments)    Bone and muscle pain  . Penicillins Swelling    Facial swelling  . Pravastatin Other (See Comments)    Bone and muscle pain  . Rosuvastatin Other (See Comments)    Bone and muscle pain  . Cortisone Rash  . Myrbetriq [Mirabegron]     Rash on face  . Pseudoephedrine Hcl Er Rash and Other (See Comments)    Face gets red    Assessment/Plan:  1. Hyperlipidemia - LDL 143 above goal < 70mg /dL given history of CAD and DES. Pt intolerant to pravastatin, atorvastatin, and rosuvastatin. Only expect an 18% LDL reduction with Zetia monotherapy + lack of mortality data. Would prefer PCSK9i since this is the only option to bring LDL to goal. Discussed expected benefit, side effects, and administration technique. Will initiate paperwork for Repatha coverage.   Porcha Deblanc E. Karson Reede, PharmD, [place] [id number] [place], [place] [address] Phone: [phone]; Fax: [phone] 04/23/2016 7:42 AM

## 2016-04-23 NOTE — Addendum Note (Signed)
Addended by: Cabria Micalizzi E on: 04/23/2016 04:31 PM   Modules accepted: Orders

## 2016-04-24 ENCOUNTER — Other Ambulatory Visit: Payer: Self-pay | Admitting: Family Medicine

## 2016-04-27 ENCOUNTER — Other Ambulatory Visit: Payer: Self-pay | Admitting: Pharmacist

## 2016-04-27 MED ORDER — EVOLOCUMAB 140 MG/ML ~~LOC~~ SOAJ: 140.0000 mg | SUBCUTANEOUS | 11 refills | Status: DC

## 2016-05-06 ENCOUNTER — Telehealth: Payer: Self-pay | Admitting: Cardiology

## 2016-05-06 NOTE — Telephone Encounter (Signed)
New message   Pt call requesting to speak with RN Jinny Blossom. Pt wants to know the medication she will going on that was previously discuss. Please call back.

## 2016-05-06 NOTE — Telephone Encounter (Signed)
Spoke with pt.  She received a phone call from the pharmacy to set up shipment for [place].  I told her to hold off getting the medication until we know if she will be approved for patient assistance.  She will let the pharmacy know she does not want the shipment yet.

## 2016-05-11 ENCOUNTER — Telehealth: Payer: Self-pay | Admitting: *Deleted

## 2016-05-11 NOTE — Telephone Encounter (Signed)
Returned patient's call. [employer] is still reviewing her Repatha application and will reach out to pt once they have made a decision on coverage.

## 2016-06-01 ENCOUNTER — Telehealth: Payer: Self-pay | Admitting: Cardiology

## 2016-06-01 MED ORDER — EVOLOCUMAB 140 MG/ML ~~LOC~~ SOAJ: 140.0000 mg | SUBCUTANEOUS | 11 refills | Status: DC

## 2016-06-01 NOTE — Telephone Encounter (Signed)
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 Pt now agreeable to pay her $100 copay which we had previously discussed and she said was too much. She would like to fill rx at local pharmacy at [facility]. Rx sent in. Pt will call when she picks up first month supply, she would like to give her first injection in clinic. Will schedule f/u lab work at that time.

## 2016-06-01 NOTE — Telephone Encounter (Signed)
New Message   *STAT* If patient is at the pharmacy, call can be transferred to refill team.   1. Which medications need to be refilled? (please list name of each medication and dose if known) Repatha  2. Which pharmacy/location (including street and city if local pharmacy) is medication to be sent to? [address] [place], [place] [address]  3. Do they need a 30 day or 90 day supply? 30 day

## 2016-06-03 ENCOUNTER — Telehealth: Payer: Self-pay | Admitting: Pharmacist

## 2016-06-03 DIAGNOSIS — E785 Hyperlipidemia, unspecified: Secondary | ICD-10-CM

## 2016-06-03 NOTE — Telephone Encounter (Signed)
Pt presents to clinic for first Repatha injection. Injection self-administered in the abdomen. Lab work scheduled for 6 weeks out.

## 2016-06-14 ENCOUNTER — Telehealth: Payer: Self-pay | Admitting: Pharmacist

## 2016-06-14 NOTE — Telephone Encounter (Signed)
Patient called to report that she experienced itching and burning feet the evening after her first Repatha injection - lasted for about 12 hours. Also complains of dizziness and back and knee aches. Also complains of more fatigue. These side effects started 3 days after the injection and have been present for the past week and a half.  Advised pt to skip her upcoming Repatha injection that's scheduled in 3 days since she is still feeling bad. Would like her to rechallenge with a 2nd dose of Repatha at the end of the month. She is willing to do this and will call clinic with tolerability.

## 2016-06-16 ENCOUNTER — Other Ambulatory Visit: Payer: Self-pay | Admitting: *Deleted

## 2016-06-16 MED ORDER — LETROZOLE 2.5 MG PO TABS: 2.5000 mg | ORAL_TABLET | Freq: Every day | ORAL | 6 refills | Status: DC

## 2016-07-08 ENCOUNTER — Other Ambulatory Visit: Payer: Medicare Other

## 2016-07-08 ENCOUNTER — Ambulatory Visit: Payer: Medicare Other

## 2016-07-08 ENCOUNTER — Ambulatory Visit: Payer: Medicare Other | Admitting: Hematology & Oncology

## 2016-07-10 ENCOUNTER — Other Ambulatory Visit: Payer: Self-pay | Admitting: Family Medicine

## 2016-07-12 ENCOUNTER — Other Ambulatory Visit (HOSPITAL_BASED_OUTPATIENT_CLINIC_OR_DEPARTMENT_OTHER): Payer: Medicare Other

## 2016-07-12 ENCOUNTER — Ambulatory Visit (HOSPITAL_BASED_OUTPATIENT_CLINIC_OR_DEPARTMENT_OTHER): Payer: Medicare Other

## 2016-07-12 ENCOUNTER — Ambulatory Visit (HOSPITAL_BASED_OUTPATIENT_CLINIC_OR_DEPARTMENT_OTHER): Payer: Medicare Other | Admitting: Family

## 2016-07-12 ENCOUNTER — Encounter: Payer: Self-pay | Admitting: Hematology & Oncology

## 2016-07-12 VITALS — BP 144/59 | HR 65 | Temp 98.2°F | Resp 16 | Ht 68.0 in | Wt 168.8 lb

## 2016-07-12 DIAGNOSIS — C50412 Malignant neoplasm of upper-outer quadrant of left female breast: Secondary | ICD-10-CM

## 2016-07-12 DIAGNOSIS — K909 Intestinal malabsorption, unspecified: Secondary | ICD-10-CM

## 2016-07-12 DIAGNOSIS — Z79811 Long term (current) use of aromatase inhibitors: Secondary | ICD-10-CM

## 2016-07-12 DIAGNOSIS — D500 Iron deficiency anemia secondary to blood loss (chronic): Secondary | ICD-10-CM

## 2016-07-12 DIAGNOSIS — Z170 Estrogen receptor positive status [ER+]: Secondary | ICD-10-CM

## 2016-07-12 DIAGNOSIS — C50012 Malignant neoplasm of nipple and areola, left female breast: Secondary | ICD-10-CM

## 2016-07-12 LAB — CBC WITH DIFFERENTIAL (CANCER CENTER ONLY)
BASO#: 0 10*3/uL (ref 0.0–0.2)
BASO%: 0.6 % (ref 0.0–2.0)
EOS%: 3.4 % (ref 0.0–7.0)
Eosinophils Absolute: 0.2 10*3/uL (ref 0.0–0.5)
HCT: 38.8 % (ref 34.8–46.6)
HGB: 13.2 g/dL (ref 11.6–15.9)
LYMPH#: 1.1 10*3/uL (ref 0.9–3.3)
LYMPH%: 21.6 % (ref 14.0–48.0)
MCH: 33.2 pg (ref 26.0–34.0)
MCHC: 34 g/dL (ref 32.0–36.0)
MCV: 98 fL (ref 81–101)
MONO#: 0.4 10*3/uL (ref 0.1–0.9)
MONO%: 7.8 % (ref 0.0–13.0)
NEUT#: 3.3 10*3/uL (ref 1.5–6.5)
NEUT%: 66.6 % (ref 39.6–80.0)
Platelets: 121 10*3/uL — ABNORMAL LOW (ref 145–400)
RBC: 3.97 10*6/uL (ref 3.70–5.32)
RDW: 13.4 % (ref 11.1–15.7)
WBC: 5 10*3/uL (ref 3.9–10.0)

## 2016-07-12 LAB — CMP (CANCER CENTER ONLY)
ALT(SGPT): 14 U/L (ref 10–47)
AST: 15 U/L (ref 11–38)
Albumin: 3.9 g/dL (ref 3.3–5.5)
Alkaline Phosphatase: 36 U/L (ref 26–84)
BUN, Bld: 13 mg/dL (ref 7–22)
CO2: 28 mEq/L (ref 18–33)
Calcium: 8.9 mg/dL (ref 8.0–10.3)
Chloride: 104 mEq/L (ref 98–108)
Creat: 1 mg/dl (ref 0.6–1.2)
Glucose, Bld: 98 mg/dL (ref 73–118)
Potassium: 4 mEq/L (ref 3.3–4.7)
Sodium: 135 mEq/L (ref 128–145)
Total Bilirubin: 0.9 mg/dl (ref 0.20–1.60)
Total Protein: 6.4 g/dL (ref 6.4–8.1)

## 2016-07-12 LAB — IRON AND TIBC
%SAT: 28 % (ref 21–57)
Iron: 84 ug/dL (ref 41–142)
TIBC: 294 ug/dL (ref 236–444)
UIBC: 210 ug/dL (ref 120–384)

## 2016-07-12 LAB — FERRITIN: Ferritin: 79 ng/ml (ref 9–269)

## 2016-07-12 MED ORDER — DENOSUMAB 60 MG/ML ~~LOC~~ SOLN
60.0000 mg | Freq: Once | SUBCUTANEOUS | Status: AC
  Administered 2016-07-12: 60 mg via SUBCUTANEOUS
  Filled 2016-07-12: qty 1

## 2016-07-12 NOTE — Patient Instructions (Signed)
Denosumab injection  What is this medicine?  DENOSUMAB (den oh sue mab) slows bone breakdown. Prolia is used to treat osteoporosis in women after menopause and in men. Xgeva is used to prevent bone fractures and other bone problems caused by cancer bone metastases. Xgeva is also used to treat giant cell tumor of the bone.  This medicine may be used for other purposes; ask your health care provider or pharmacist if you have questions.  What should I tell my health care provider before I take this medicine?  They need to know if you have any of these conditions:  -dental disease  -eczema  -infection or history of infections  -kidney disease or on dialysis  -low blood calcium or vitamin D  -malabsorption syndrome  -scheduled to have surgery or tooth extraction  -taking medicine that contains denosumab  -thyroid or parathyroid disease  -an unusual reaction to denosumab, other medicines, foods, dyes, or preservatives  -pregnant or trying to get pregnant  -breast-feeding  How should I use this medicine?  This medicine is for injection under the skin. It is given by a health care professional in a hospital or clinic setting.  If you are getting Prolia, a special MedGuide will be given to you by the pharmacist with each prescription and refill. Be sure to read this information carefully each time.  For Prolia, talk to your pediatrician regarding the use of this medicine in children. Special care may be needed. For Xgeva, talk to your pediatrician regarding the use of this medicine in children. While this drug may be prescribed for children as young as 13 years for selected conditions, precautions do apply.  Overdosage: If you think you have taken too much of this medicine contact a poison control center or emergency room at once.  NOTE: This medicine is only for you. Do not share this medicine with others.  What if I miss a dose?  It is important not to miss your dose. Call your doctor or health care professional if you are  unable to keep an appointment.  What may interact with this medicine?  Do not take this medicine with any of the following medications:  -other medicines containing denosumab  This medicine may also interact with the following medications:  -medicines that suppress the immune system  -medicines that treat cancer  -steroid medicines like prednisone or cortisone  This list may not describe all possible interactions. Give your health care provider a list of all the medicines, herbs, non-prescription drugs, or dietary supplements you use. Also tell them if you smoke, drink alcohol, or use illegal drugs. Some items may interact with your medicine.  What should I watch for while using this medicine?  Visit your doctor or health care professional for regular checks on your progress. Your doctor or health care professional may order blood tests and other tests to see how you are doing.  Call your doctor or health care professional if you get a cold or other infection while receiving this medicine. Do not treat yourself. This medicine may decrease your body's ability to fight infection.  You should make sure you get enough calcium and vitamin D while you are taking this medicine, unless your doctor tells you not to. Discuss the foods you eat and the vitamins you take with your health care professional.  See your dentist regularly. Brush and floss your teeth as directed. Before you have any dental work done, tell your dentist you are receiving this medicine.  Do   not become pregnant while taking this medicine or for 5 months after stopping it. Women should inform their doctor if they wish to become pregnant or think they might be pregnant. There is a potential for serious side effects to an unborn child. Talk to your health care professional or pharmacist for more information.  What side effects may I notice from receiving this medicine?  Side effects that you should report to your doctor or health care professional as soon as  possible:  -allergic reactions like skin rash, itching or hives, swelling of the face, lips, or tongue  -breathing problems  -chest pain  -fast, irregular heartbeat  -feeling faint or lightheaded, falls  -fever, chills, or any other sign of infection  -muscle spasms, tightening, or twitches  -numbness or tingling  -skin blisters or bumps, or is dry, peels, or red  -slow healing or unexplained pain in the mouth or jaw  -unusual bleeding or bruising  Side effects that usually do not require medical attention (Report these to your doctor or health care professional if they continue or are bothersome.):  -muscle pain  -stomach upset, gas  This list may not describe all possible side effects. Call your doctor for medical advice about side effects. You may report side effects to FDA at 1-800-FDA-1088.  Where should I keep my medicine?  This medicine is only given in a clinic, doctor's office, or other health care setting and will not be stored at home.  NOTE: This sheet is a summary. It may not cover all possible information. If you have questions about this medicine, talk to your doctor, pharmacist, or health care provider.      2016, [employer] Standard. (2012-03-20 12:37:47)

## 2016-07-12 NOTE — Progress Notes (Signed)
Hematology and Oncology Follow Up Visit  Kim [name] [id number] 07-14-1935 81 y.o. 07/12/2016   Principle Diagnosis:  Stage IIA (T1N1M0) adenocarcinoma of the left breast-triple positive Iron deficiency anemia secondary to blood loss  Current Therapy:   Status post cycle 4 of Abraxane/Herceptin Maintenance Herceptin q 3wk - finished 6/21 Prolia 60 gm sq every 6 months - received today, due in April 2018 Femara 2.5 mg by mouth daily  IV iron as indicated    Interim History: Kim [name] is here today for follow-up and treatment. She is doing fairly well but still having intermittent fatigue. She received Feraheme in July for an iron saturation if 15% and ferritin of 27. She responded nicely and her CBC shows Hgb now up to 13.2 with an MCV of 98.  Bilateral breast and axillary exam today was negative. No lymphadenopathy found on exam.  No fever, chills, n/v, cough, rash, headache, dizziness, SOB, chest pain, palpitations, abdominal pain or changes in bowel or bladder habits. She has occasional constipation and takes Milk of Magnesia as needed.  She is on 1 baby aspirin every other day and has noticed that she does bruise easily. No episodes of bleeding or petechiae.  No swelling in her extremities. The mild neuropathy in her feet is unchanged.  She has maintained a good appetite and is staying well hydrated. She has now started Repath injections every 3 weeks for her hyperlipidemia and will have her lipids checked again next week. She states that her joints have ached some since starting the injections.  No falls or syncopal episodes.   Medications:    Medication List       Accurate as of 07/12/16 10:17 AM. Always use your most recent med list.          acetaminophen 650 MG CR tablet Commonly known as:  TYLENOL Take 650 mg by mouth every 8 (eight) hours as needed for pain.   aspirin 81 MG EC tablet Take 1 tablet (81 mg total) by mouth daily.   CENTRUM ADULTS PO Take by  mouth.   cetirizine 10 MG tablet Commonly known as:  ZYRTEC Take 10 mg by mouth daily as needed for allergies.   CITRACAL MAXIMUM PO Take 1 tablet by mouth daily.   Coenzyme Q10 200 MG Tabs One tablet by mouth daily   Evolocumab 140 MG/ML Soaj Commonly known as:  REPATHA SURECLICK Inject [id number] mg into the skin every 14 (fourteen) days.   furosemide 20 MG tablet Commonly known as:  LASIX Take 1 tablet (20 mg total) by mouth daily. Take 1 tablet (20 mg total) every day x 3 days then take once daily as needed   gabapentin 100 MG capsule Commonly known as:  NEURONTIN Take 1 capsule (100 mg total) by mouth 3 (three) times daily.   IMODIUM PO Take by mouth as needed.   letrozole 2.5 MG tablet Commonly known as:  FEMARA Take 1 tablet (2.5 mg total) by mouth daily.   levothyroxine 112 MCG tablet Commonly known as:  SYNTHROID, LEVOTHROID TAKE 1 TABLET BY MOUTH ONCE A DAY   lidocaine-prilocaine cream Commonly known as:  EMLA Apply topically as needed.   metoprolol succinate 50 MG 24 hr tablet Commonly known as:  TOPROL-XL TAKE 1 TABLET BY MOUTH TWICE A DAY ** TAKE WITH OR IMMEDIATELY FOLLOWING A MEAL **   nitroGLYCERIN 0.4 MG SL tablet Commonly known as:  NITROSTAT Place 1 tablet (0.4 mg total) under the tongue every 5 (five) minutes x  3 doses as needed for chest pain.   Vitamin B-12 2500 MCG Subl Place under the tongue as needed.   vitamin C 1000 MG tablet Take 1,000 mg by mouth daily.       Allergies:  Allergies  Allergen Reactions  . Propoxyphene N-Acetaminophen Swelling    tongue swelling  . Atorvastatin Other (See Comments)    Bone and muscle pain  . Penicillins Swelling    Facial swelling  . Pravastatin Other (See Comments)    Bone and muscle pain  . Rosuvastatin Other (See Comments)    Bone and muscle pain  . Cortisone Rash  . Myrbetriq [Mirabegron]     Rash on face  . Pseudoephedrine Hcl Er Rash and Other (See Comments)    Face gets red    Past  Medical History, Surgical history, Social history, and Family History were reviewed and updated.  Review of Systems: All other 10 point review of systems is negative.   Physical Exam:  height is 5\' 8"  (1.727 m) and weight is 168 lb 12.8 oz (76.6 kg). Her oral temperature is 98.2 F (36.8 C). Her blood pressure is 144/59 (abnormal) and her pulse is 65. Her respiration is 16.   Wt Readings from Last 3 Encounters:  07/12/16 168 lb 12.8 oz (76.6 kg)  04/21/16 162 lb (73.5 kg)  04/07/16 167 lb (75.8 kg)    Ocular: Sclerae unicteric, pupils equal, round and reactive to light Ear-nose-throat: Oropharynx clear, dentition fair Lymphatic: No cervical supraclavicular or axillary adenopathy Lungs no rales or rhonchi, good excursion bilaterally Heart regular rate and rhythm, no murmur appreciated Abd soft, nontender, positive bowel sounds, no liver or spleen tip palpated on exam, no fluid wave MSK no focal spinal tenderness, no joint edema Neuro: non-focal, well-oriented, appropriate affect Breasts: No changes. No mass, lesion, rash or lymphedema.   Lab Results  Component Value Date   WBC 5.0 07/12/2016   HGB 13.2 07/12/2016   HCT 38.8 07/12/2016   MCV 98 07/12/2016   PLT 121 (L) 07/12/2016   Lab Results  Component Value Date   FERRITIN 27 04/07/2016   IRON 55 04/07/2016   TIBC 369 04/07/2016   UIBC 314 04/07/2016   IRONPCTSAT 15 (L) 04/07/2016   Lab Results  Component Value Date   RBC 3.97 07/12/2016   No results found for: KPAFRELGTCHN, LAMBDASER, KAPLAMBRATIO No results found for: Kandis Cocking, IGMSERUM No results found for: Odetta Pink, SPEI   Chemistry      Component Value Date/Time   NA 137 04/07/2016 0917   NA 141 10/07/2015 0820   K 4.1 04/07/2016 0917   K 4.6 10/07/2015 0820   CL 104 04/07/2016 0917   CO2 26 04/07/2016 0917   CO2 26 10/07/2015 0820   BUN 20 04/07/2016 0917   BUN 20.7 10/07/2015 0820    CREATININE 1.2 04/07/2016 0917   CREATININE 1.1 10/07/2015 0820      Component Value Date/Time   CALCIUM 9.1 04/07/2016 0917   CALCIUM 9.7 10/07/2015 0820   ALKPHOS 42 04/07/2016 0917   ALKPHOS 60 10/07/2015 0820   AST 20 04/07/2016 0917   AST 13 10/07/2015 0820   ALT 17 04/07/2016 0917   ALT 9 10/07/2015 0820   BILITOT 1.00 04/07/2016 0917   BILITOT 0.54 10/07/2015 0820     Impression and Plan: Ms. Mesich is 81 yo white female with stage IIA ductal carcinoma of the left breast. She had 4 positive lymph  nodes. Her breast cancer was triple positive. She completed her adjuvant chemotherapy in July 2015. She also completed radiation to the left breast. She finished her maintenance Herceptin in June and she is now on Femara 2.5 mg PO daily She also receives Prolia once every 6 months. So far, she has done well and there has been no evidence of recurrence.  She is due for her Prolia injection again today.  CBC and CMP today look good. She will continue on Femara PO daily.   We will plan to see her back in 3 months for repeat lab work and follow-up.  She will contact our office with any questions or concerns. We can certainly see her sooner if need be.   Eliezer Bottom, NP 10/9/201710:17 AM

## 2016-07-19 ENCOUNTER — Other Ambulatory Visit: Payer: Medicare Other | Admitting: *Deleted

## 2016-07-19 DIAGNOSIS — E785 Hyperlipidemia, unspecified: Secondary | ICD-10-CM

## 2016-07-19 LAB — LIPID PANEL
Cholesterol: 124 mg/dL — ABNORMAL LOW (ref 125–200)
HDL: 46 mg/dL (ref >=46)
LDL Cholesterol: 50 mg/dL (ref <130)
Total CHOL/HDL Ratio: 2.7 Ratio (ref <=5.0)
Triglycerides: 138 mg/dL (ref <150)
VLDL: 28 mg/dL (ref <30)

## 2016-07-19 LAB — HEPATIC FUNCTION PANEL
ALT: 9 U/L (ref 6–29)
AST: 13 U/L (ref 10–35)
Albumin: 4 g/dL (ref 3.6–5.1)
Alkaline Phosphatase: 35 U/L (ref 33–130)
Bilirubin, Direct: 0.2 mg/dL (ref <=0.2)
Indirect Bilirubin: 0.6 mg/dL (ref 0.2–1.2)
Total Bilirubin: 0.8 mg/dL (ref 0.2–1.2)
Total Protein: 6.2 g/dL (ref 6.1–8.1)

## 2016-07-21 ENCOUNTER — Telehealth: Payer: Self-pay | Admitting: Cardiology

## 2016-07-21 NOTE — Telephone Encounter (Signed)
Spoke with patient about recent lipid profile results.

## 2016-07-21 NOTE — Telephone Encounter (Signed)
New Message  Pt voiced she is needing nurse to go over a report with pt.  Please f/u

## 2016-07-30 ENCOUNTER — Other Ambulatory Visit: Payer: Self-pay | Admitting: Family

## 2016-07-30 DIAGNOSIS — M810 Age-related osteoporosis without current pathological fracture: Secondary | ICD-10-CM | POA: Insufficient documentation

## 2016-07-30 DIAGNOSIS — M818 Other osteoporosis without current pathological fracture: Secondary | ICD-10-CM

## 2016-08-12 ENCOUNTER — Telehealth: Payer: Self-pay | Admitting: Pharmacist

## 2016-08-12 NOTE — Telephone Encounter (Signed)
Patient called back to state that she has not tolerated Repatha any better. She has taken 4 injections so far and has had myalgias and fatigue. Previously experienced dizziness, aches, and burning and itching in her feet. She had previously tried a drug holiday and skipped 2 injections. Upon rechallenge, her symptoms appeared again. She states that she would like to discontinue Repatha. Have updated patient's allergy and medication list.

## 2016-08-16 ENCOUNTER — Other Ambulatory Visit: Payer: Self-pay | Admitting: Cardiology

## 2016-08-20 ENCOUNTER — Encounter: Payer: Self-pay | Admitting: Adult Health

## 2016-08-20 ENCOUNTER — Ambulatory Visit (INDEPENDENT_AMBULATORY_CARE_PROVIDER_SITE_OTHER): Payer: Medicare Other | Admitting: Adult Health

## 2016-08-20 ENCOUNTER — Other Ambulatory Visit: Payer: Self-pay | Admitting: Adult Health

## 2016-08-20 VITALS — BP 120/80 | HR 69 | Temp 98.2°F | Ht 68.0 in | Wt 162.0 lb

## 2016-08-20 DIAGNOSIS — R350 Frequency of micturition: Secondary | ICD-10-CM

## 2016-08-20 LAB — POCT URINALYSIS DIPSTICK
Bilirubin, UA: NEGATIVE
Glucose, UA: NEGATIVE
Ketones, UA: NEGATIVE
Nitrite, UA: POSITIVE
Protein, UA: NEGATIVE
Spec Grav, UA: 1.015
Urobilinogen, UA: 0.2
pH, UA: 5.5

## 2016-08-20 MED ORDER — CIPROFLOXACIN HCL 500 MG PO TABS: 500.0000 mg | ORAL_TABLET | Freq: Two times a day (BID) | ORAL | 0 refills | Status: DC

## 2016-08-20 NOTE — Progress Notes (Signed)
   Subjective:    Patient ID: Kim [name], female    DOB: June 25, 1935, 81 y.o.   MRN: [id number]  HPI  81 year old female who presents to the office today for the acute complaint of urinary frequency. She reports having a bladder sling about 3 years ago and feels as though " it did not work". She is worried that she may have a UTI due to " I am going to the restroom a lot more often." This has been an issue for the last month. She denies any other urinary symptoms such as hematuria, dysuria, urinary retention, or flank pain.   She states " at night it is the worst, I get up sometimes and it just flows out." She has been having to wear pads  Review of Systems  Constitutional: Negative.   Respiratory: Negative.   Cardiovascular: Negative.   Gastrointestinal: Negative.   Genitourinary: Positive for frequency and urgency. Negative for decreased urine volume, dysuria, flank pain, hematuria and pelvic pain.  Neurological: Negative.   All other systems reviewed and are negative.      Objective:   Physical Exam  Constitutional: She is oriented to person, place, and time. She appears well-developed and well-nourished. No distress.  Cardiovascular: Normal rate, regular rhythm, normal heart sounds and intact distal pulses.  Exam reveals no gallop and no friction rub.   No murmur heard. Pulmonary/Chest: Effort normal and breath sounds normal. No respiratory distress. She has no wheezes. She has no rales.  Abdominal: Soft. Bowel sounds are normal. She exhibits no distension and no mass. There is no tenderness. There is no rebound and no guarding.  Neurological: She is alert and oriented to person, place, and time.  Skin: Skin is warm and dry. No rash noted. She is not diaphoretic. No erythema. No pallor.  Psychiatric: She has a normal mood and affect. Her behavior is normal. Judgment and thought content normal.  Nursing note and vitals reviewed.     Assessment & Plan:  1. Urinary frequency -  Will get UA to make sure her symptoms are not a result of a UTI  - Advised that it would be beneficial for her to follow up with GYN who did her bladder surgery  - POC Urinalysis Dipstick - She is allergic to Myrbetriq but can consider using other agent.  - Follow up as needed with PCP  Dorothyann Peng, NP

## 2016-08-20 NOTE — Addendum Note (Signed)
Addended by: Gari Crown D on: 08/20/2016 04:17 PM   Modules accepted: Orders

## 2016-08-20 NOTE — Progress Notes (Signed)
Pre visit review using our clinic review tool, if applicable. No additional management support is needed unless otherwise documented below in the visit note. 

## 2016-08-23 LAB — URINE CULTURE

## 2016-08-25 ENCOUNTER — Telehealth: Payer: Self-pay | Admitting: Family Medicine

## 2016-08-25 NOTE — Telephone Encounter (Signed)
Patient notified of lab results. Patient verbalized understanding.    

## 2016-08-25 NOTE — Telephone Encounter (Signed)
Pt saw cory on 08-20-16 and needs UA result

## 2016-08-30 ENCOUNTER — Other Ambulatory Visit: Payer: Self-pay

## 2016-08-30 MED ORDER — CIPROFLOXACIN HCL 500 MG PO TABS: 500.0000 mg | ORAL_TABLET | Freq: Two times a day (BID) | ORAL | 0 refills | Status: DC

## 2016-09-07 ENCOUNTER — Telehealth: Payer: Self-pay | Admitting: Family Medicine

## 2016-09-07 NOTE — Telephone Encounter (Signed)
Pt state that she mailed in a form with a self stamped returned envelope on 11/20 to get her handicap tags renewed that Dr. Sarajane Jews needed to fill out and she has not heard anything back and would like to know what the status is on that form.  Pt is aware that Dr. Sarajane Jews had been out of the office a few days in November.

## 2016-09-07 NOTE — Telephone Encounter (Signed)
I filled this out this am

## 2016-09-16 ENCOUNTER — Ambulatory Visit (INDEPENDENT_AMBULATORY_CARE_PROVIDER_SITE_OTHER): Payer: Medicare Other

## 2016-09-16 ENCOUNTER — Telehealth: Payer: Self-pay

## 2016-09-16 VITALS — BP 130/80 | HR 57 | Ht 68.0 in | Wt 162.0 lb

## 2016-09-16 DIAGNOSIS — Z0000 Encounter for general adult medical examination without abnormal findings: Secondary | ICD-10-CM | POA: Diagnosis not present

## 2016-09-16 DIAGNOSIS — R300 Dysuria: Secondary | ICD-10-CM

## 2016-09-16 LAB — POC URINALSYSI DIPSTICK (AUTOMATED)
Bilirubin, UA: NEGATIVE
Glucose, UA: NEGATIVE
Ketones, UA: NEGATIVE
Nitrite, UA: POSITIVE
Protein, UA: NEGATIVE
Spec Grav, UA: 1.01
Urobilinogen, UA: 0.2
pH, UA: 5.5

## 2016-09-16 MED ORDER — CIPROFLOXACIN HCL 500 MG PO TABS: 500.0000 mg | ORAL_TABLET | Freq: Two times a day (BID) | ORAL | 0 refills | Status: DC
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
## 2016-09-16 NOTE — Patient Instructions (Addendum)
Kim [name] , Thank you for taking time to come for your Medicare Wellness Visit. I appreciate your ongoing commitment to your health goals. Please review the following plan we discussed and let me know if I can assist you in the future.   Will check ua today  Will make apt with Dr. Sarajane Jews for fup for Urine and general med review      These are the goals we discussed: Goals    . Exercise 150 minutes per week (moderate activity)          [facility]        This is a list of the screening recommended for you and due dates:  Health Maintenance  Topic Date Due  . Tetanus Vaccine  11/28/1953  . Shingles Vaccine  11/27/1996  . Pneumonia vaccines (1 of 2 - PCV13) 11/27/2001  . Flu Shot  Completed  . DEXA scan (bone density measurement)  Completed     Fall Prevention in the Home Introduction Falls can cause injuries. They can happen to people of all ages. There are many things you can do to make your home safe and to help prevent falls. What can I do on the outside of my home?  Regularly fix the edges of walkways and driveways and fix any cracks.  Remove anything that might make you trip as you walk through a door, such as a raised step or threshold.  Trim any bushes or trees on the path to your home.  Use bright outdoor lighting.  Clear any walking paths of anything that might make someone trip, such as rocks or tools.  Regularly check to see if handrails are loose or broken. Make sure that both sides of any steps have handrails.  Any raised decks and porches should have guardrails on the edges.  Have any leaves, snow, or ice cleared regularly.  Use sand or salt on walking paths during winter.  Clean up any spills in your garage right away. This includes oil or grease spills. What can I do in the bathroom?  Use night lights.  Install grab bars by the toilet and in the tub and shower. Do not use towel bars as grab  bars.  Use non-skid mats or decals in the tub or shower.  If you need to sit down in the shower, use a plastic, non-slip stool.  Keep the floor dry. Clean up any water that spills on the floor as soon as it happens.  Remove soap buildup in the tub or shower regularly.  Attach bath mats securely with double-sided non-slip rug tape.  Do not have throw rugs and other things on the floor that can make you trip. What can I do in the bedroom?  Use night lights.  Make sure that you have a light by your bed that is easy to reach.  Do not use any sheets or blankets that are too big for your bed. They should not hang down onto the floor.  Have a firm chair that has side arms. You can use this for support while you get dressed.  Do not have throw rugs and other things on the floor that can make you trip. What can I do in the kitchen?  Clean up any spills right away.  Avoid walking on wet floors.  Keep items that you use a lot in easy-to-reach places.  If you need to reach something above you,  use a strong step stool that has a grab bar.  Keep electrical cords out of the way.  Do not use floor polish or wax that makes floors slippery. If you must use wax, use non-skid floor wax.  Do not have throw rugs and other things on the floor that can make you trip. What can I do with my stairs?  Do not leave any items on the stairs.  Make sure that there are handrails on both sides of the stairs and use them. Fix handrails that are broken or loose. Make sure that handrails are as long as the stairways.  Check any carpeting to make sure that it is firmly attached to the stairs. Fix any carpet that is loose or worn.  Avoid having throw rugs at the top or bottom of the stairs. If you do have throw rugs, attach them to the floor with carpet tape.  Make sure that you have a light switch at the top of the stairs and the bottom of the stairs. If you do not have them, ask someone to add them for  you. What else can I do to help prevent falls?  Wear shoes that:  Do not have high heels.  Have rubber bottoms.  Are comfortable and fit you well.  Are closed at the toe. Do not wear sandals.  If you use a stepladder:  Make sure that it is fully opened. Do not climb a closed stepladder.  Make sure that both sides of the stepladder are locked into place.  Ask someone to hold it for you, if possible.  Clearly mark and make sure that you can see:  Any grab bars or handrails.  First and last steps.  Where the edge of each step is.  Use tools that help you move around (mobility aids) if they are needed. These include:  Canes.  Walkers.  Scooters.  Crutches.  Turn on the lights when you go into a dark area. Replace any light bulbs as soon as they burn out.  Set up your furniture so you have a clear path. Avoid moving your furniture around.  If any of your floors are uneven, fix them.  If there are any pets around you, be aware of where they are.  Review your medicines with your doctor. Some medicines can make you feel dizzy. This can increase your chance of falling. Ask your doctor what other things that you can do to help prevent falls. This information is not intended to replace advice given to you by your health care provider. Make sure you discuss any questions you have with your health care provider. Document Released: 07/17/2009 Document Revised: 02/26/2016 Document Reviewed: 10/25/2014  2017 [employer]  Health Maintenance, Female Introduction Adopting a healthy lifestyle and getting preventive care can go a long way to promote health and wellness. Talk with your health care provider about what schedule of regular examinations is right for you. This is a good chance for you to check in with your provider about disease prevention and staying healthy. In between checkups, there are plenty of things you can do on your own. Experts have done a lot of research about  which lifestyle changes and preventive measures are most likely to keep you healthy. Ask your health care provider for more information. Weight and diet Eat a healthy diet  Be sure to include plenty of vegetables, fruits, low-fat dairy products, and lean protein.  Do not eat a lot of foods high in solid fats,  added sugars, or salt.  Get regular exercise. This is one of the most important things you can do for your health.  Most adults should exercise for at least 150 minutes each week. The exercise should increase your heart rate and make you sweat (moderate-intensity exercise).  Most adults should also do strengthening exercises at least twice a week. This is in addition to the moderate-intensity exercise. Maintain a healthy weight  Body mass index (BMI) is a measurement that can be used to identify possible weight problems. It estimates body fat based on height and weight. Your health care provider can help determine your BMI and help you achieve or maintain a healthy weight.  For females 51 years of age and older:  A BMI below 18.5 is considered underweight.  A BMI of 18.5 to 24.9 is normal.  A BMI of 25 to 29.9 is considered overweight.  A BMI of 30 and above is considered obese. Watch levels of cholesterol and blood lipids  You should start having your blood tested for lipids and cholesterol at 79 years of age, then have this test every 5 years.  You may need to have your cholesterol levels checked more often if:  Your lipid or cholesterol levels are high.  You are older than 79 years of age.  You are at high risk for heart disease. Cancer screening Lung Cancer  Lung cancer screening is recommended for adults 51-66 years old who are at high risk for lung cancer because of a history of smoking.  A yearly low-dose CT scan of the lungs is recommended for people who:  Currently smoke.  Have quit within the past 15 years.  Have at least a 30-pack-year history of  smoking. A pack year is smoking an average of one pack of cigarettes a day for 1 year.  Yearly screening should continue until it has been 15 years since you quit.  Yearly screening should stop if you develop a health problem that would prevent you from having lung cancer treatment. Breast Cancer  Practice breast self-awareness. This means understanding how your breasts normally appear and feel.  It also means doing regular breast self-exams. Let your health care provider know about any changes, no matter how small.  If you are in your 20s or 30s, you should have a clinical breast exam (CBE) by a health care provider every 1-3 years as part of a regular health exam.  If you are 60 or older, have a CBE every year. Also consider having a breast X-ray (mammogram) every year.  If you have a family history of breast cancer, talk to your health care provider about genetic screening.  If you are at high risk for breast cancer, talk to your health care provider about having an MRI and a mammogram every year.  Breast cancer gene (BRCA) assessment is recommended for women who have family members with BRCA-related cancers. BRCA-related cancers include:  Breast.  Ovarian.  Tubal.  Peritoneal cancers.  Results of the assessment will determine the need for genetic counseling and BRCA1 and BRCA2 testing. Cervical Cancer  Your health care provider may recommend that you be screened regularly for cancer of the pelvic organs (ovaries, uterus, and vagina). This screening involves a pelvic examination, including checking for microscopic changes to the surface of your cervix (Pap test). You may be encouraged to have this screening done every 3 years, beginning at age 74.  For women ages 26-65, health care providers may recommend pelvic exams and Pap  testing every 3 years, or they may recommend the Pap and pelvic exam, combined with testing for human papilloma virus (HPV), every 5 years. Some types of HPV  increase your risk of cervical cancer. Testing for HPV may also be done on women of any age with unclear Pap test results.  Other health care providers may not recommend any screening for nonpregnant women who are considered low risk for pelvic cancer and who do not have symptoms. Ask your health care provider if a screening pelvic exam is right for you.  If you have had past treatment for cervical cancer or a condition that could lead to cancer, you need Pap tests and screening for cancer for at least 20 years after your treatment. If Pap tests have been discontinued, your risk factors (such as having a new sexual partner) need to be reassessed to determine if screening should resume. Some women have medical problems that increase the chance of getting cervical cancer. In these cases, your health care provider may recommend more frequent screening and Pap tests. Colorectal Cancer  This type of cancer can be detected and often prevented.  Routine colorectal cancer screening usually begins at 79 years of age and continues through 79 years of age.  Your health care provider may recommend screening at an earlier age if you have risk factors for colon cancer.  Your health care provider may also recommend using home test kits to check for hidden blood in the stool.  A small camera at the end of a tube can be used to examine your colon directly (sigmoidoscopy or colonoscopy). This is done to check for the earliest forms of colorectal cancer.  Routine screening usually begins at age 56.  Direct examination of the colon should be repeated every 5-10 years through 79 years of age. However, you may need to be screened more often if early forms of precancerous polyps or small growths are found. Skin Cancer  Check your skin from head to toe regularly.  Tell your health care provider about any new moles or changes in moles, especially if there is a change in a mole's shape or color.  Also tell your  health care provider if you have a mole that is larger than the size of a pencil eraser.  Always use sunscreen. Apply sunscreen liberally and repeatedly throughout the day.  Protect yourself by wearing long sleeves, pants, a wide-brimmed hat, and sunglasses whenever you are outside. Heart disease, diabetes, and high blood pressure  High blood pressure causes heart disease and increases the risk of stroke. High blood pressure is more likely to develop in:  People who have blood pressure in the high end of the normal range (130-139/85-89 mm Hg).  People who are overweight or obese.  People who are African American.  If you are 33-41 years of age, have your blood pressure checked every 3-5 years. If you are 75 years of age or older, have your blood pressure checked every year. You should have your blood pressure measured twice-once when you are at a hospital or clinic, and once when you are not at a hospital or clinic. Record the average of the two measurements. To check your blood pressure when you are not at a hospital or clinic, you can use:  An automated blood pressure machine at a pharmacy.  A home blood pressure monitor.  If you are between 93 years and 25 years old, ask your health care provider if you should take aspirin to prevent  strokes.  Have regular diabetes screenings. This involves taking a blood sample to check your fasting blood sugar level.  If you are at a normal weight and have a low risk for diabetes, have this test once every three years after 79 years of age.  If you are overweight and have a high risk for diabetes, consider being tested at a younger age or more often. Preventing infection Hepatitis B  If you have a higher risk for hepatitis B, you should be screened for this virus. You are considered at high risk for hepatitis B if:  You were born in a country where hepatitis B is common. Ask your health care provider which countries are considered high  risk.  Your parents were born in a high-risk country, and you have not been immunized against hepatitis B (hepatitis B vaccine).  You have HIV or AIDS.  You use needles to inject street drugs.  You live with someone who has hepatitis B.  You have had sex with someone who has hepatitis B.  You get hemodialysis treatment.  You take certain medicines for conditions, including cancer, organ transplantation, and autoimmune conditions. Hepatitis C  Blood testing is recommended for:  Everyone born from [birth year] through 1963.  Anyone with known risk factors for hepatitis C. Sexually transmitted infections (STIs)  You should be screened for sexually transmitted infections (STIs) including gonorrhea and chlamydia if:  You are sexually active and are younger than 79 years of age.  You are older than 79 years of age and your health care provider tells you that you are at risk for this type of infection.  Your sexual activity has changed since you were last screened and you are at an increased risk for chlamydia or gonorrhea. Ask your health care provider if you are at risk.  If you do not have HIV, but are at risk, it may be recommended that you take a prescription medicine daily to prevent HIV infection. This is called pre-exposure prophylaxis (PrEP). You are considered at risk if:  You are sexually active and do not regularly use condoms or know the HIV status of your partner(s).  You take drugs by injection.  You are sexually active with a partner who has HIV. Talk with your health care provider about whether you are at high risk of being infected with HIV. If you choose to begin PrEP, you should first be tested for HIV. You should then be tested every 3 months for as long as you are taking PrEP. Pregnancy  If you are premenopausal and you may become pregnant, ask your health care provider about preconception counseling.  If you may become pregnant, take 400 to 800 micrograms (mcg) of  folic acid every day.  If you want to prevent pregnancy, talk to your health care provider about birth control (contraception). Osteoporosis and menopause  Osteoporosis is a disease in which the bones lose minerals and strength with aging. This can result in serious bone fractures. Your risk for osteoporosis can be identified using a bone density scan.  If you are 72 years of age or older, or if you are at risk for osteoporosis and fractures, ask your health care provider if you should be screened.  Ask your health care provider whether you should take a calcium or vitamin D supplement to lower your risk for osteoporosis.  Menopause may have certain physical symptoms and risks.  Hormone replacement therapy may reduce some of these symptoms and risks. Talk to your health  care provider about whether hormone replacement therapy is right for you. Follow these instructions at home:  Schedule regular health, dental, and eye exams.  Stay current with your immunizations.  Do not use any tobacco products including cigarettes, chewing tobacco, or electronic cigarettes.  If you are pregnant, do not drink alcohol.  If you are breastfeeding, limit how much and how often you drink alcohol.  Limit alcohol intake to no more than 1 drink per day for nonpregnant women. One drink equals 12 ounces of beer, 5 ounces of wine, or 1 ounces of hard liquor.  Do not use street drugs.  Do not share needles.  Ask your health care provider for help if you need support or information about quitting drugs.  Tell your health care provider if you often feel depressed.  Tell your health care provider if you have ever been abused or do not feel safe at home. This information is not intended to replace advice given to you by your health care provider. Make sure you discuss any questions you have with your health care provider. Document Released: 04/05/2011 Document Revised: 02/26/2016 Document Reviewed:  06/24/2015  2017 Elsevier   Hearing Loss Introduction Hearing loss is a partial or total loss of the ability to hear. This can be temporary or permanent, and it can happen in one or both ears. Hearing loss may be referred to as deafness. Medical care is necessary to treat hearing loss properly and to prevent the condition from getting worse. Your hearing may partially or completely come back, depending on what caused your hearing loss and how severe it is. In some cases, hearing loss is permanent. What are the causes? Common causes of hearing loss include:  Too much wax in the ear canal.  Infection of the ear canal or middle ear.  Fluid in the middle ear.  Injury to the ear or surrounding area.  An object stuck in the ear.  Prolonged exposure to loud sounds, such as music. Less common causes of hearing loss include:  Tumors in the ear.  Viral or bacterial infections, such as meningitis.  A hole in the eardrum (perforated eardrum).  Problems with the hearing nerve that sends signals between the brain and the ear.  Certain medicines. What are the signs or symptoms? Symptoms of this condition may include:  Difficulty telling the difference between sounds.  Difficulty following a conversation when there is background noise.  Lack of response to sounds in your environment. This may be most noticeable when you do not respond to startling sounds.  Needing to turn up the volume on the television, radio, etc.  Ringing in the ears.  Dizziness.  Pain in the ears. How is this diagnosed? This condition is diagnosed based on a physical exam and a hearing test (audiometry). The audiometry test will be performed by a hearing specialist (audiologist). You may also be referred to an ear, nose, and throat (ENT) specialist (otolaryngologist). How is this treated? Treatment for recent onset of hearing loss may include:  Ear wax removal.  Being prescribed medicines to prevent  infection (antibiotics).  Being prescribed medicines to reduce inflammation (corticosteroids). Follow these instructions at home:  If you were prescribed an antibiotic medicine, take it as told by your health care provider. Do not stop taking the antibiotic even if you start to feel better.  Take over-the-counter and prescription medicines only as told by your health care provider.  Avoid loud noises.  Return to your normal activities as told  by your health care provider. Ask your health care provider what activities are safe for you.  Keep all follow-up visits as told by your health care provider. This is important. Contact a health care provider if:  You feel dizzy.  You develop new symptoms.  You vomit or feel nauseous.  You have a fever. Get help right away if:  You develop sudden changes in your vision.  You have severe ear pain.  You have new or increased weakness.  You have a severe headache. This information is not intended to replace advice given to you by your health care provider. Make sure you discuss any questions you have with your health care provider. Document Released: 09/20/2005 Document Revised: 02/26/2016 Document Reviewed: 02/05/2015  2017 Elsevier

## 2016-09-16 NOTE — Telephone Encounter (Signed)
Pt notified to pick up at the pharmacy. 

## 2016-09-16 NOTE — Telephone Encounter (Signed)
Kim [name] in for AWV, thought she was seeing Dr. Sarajane Jews  Stated she had seen [facility] in November for dysuria; The medicine went to the wrong pharmacy and she did not start it until a week later Cipro x 3 days. Requested Urinalysis to see if she still had infection but ongoing bladder leakage.  Ua GLU neg bil neg Ket neg sg 1.010 Blo 1+ Ph 5.5 Pro negative URO 0.2  Nit postive Leu  - trace   Apt scheduled for 1/22 for CPE/ review of meds and labs Please advise on Ua specimen  (apt made with dr fry for fup 12/15 but she could not come tomorrow)

## 2016-09-16 NOTE — Telephone Encounter (Signed)
Call in Cipro 500 mg bid for 7 days  

## 2016-09-17 ENCOUNTER — Ambulatory Visit: Payer: Medicare Other | Admitting: Family Medicine

## 2016-09-22 ENCOUNTER — Telehealth: Payer: Self-pay | Admitting: Family

## 2016-09-22 ENCOUNTER — Telehealth: Payer: Self-pay | Admitting: *Deleted

## 2016-09-22 NOTE — Telephone Encounter (Signed)
Message from pt requesting call from APP. "This is not an emergency but is very important that I talk to her today if she is there." Message forwarded to [facility] office.

## 2016-09-22 NOTE — Telephone Encounter (Signed)
I spoke with Kim [name] regarding a question she had with a bill she received for Prolia given in October. Neither she or her husband remembers her receiving the injection on 07/12/16. It was documented that is was given the injection in the right arm. Medication was scanned. She was due for her 6 month injection at that time as her last dose was in April 2017. This was confirmed by my note that same day stating she was to get her injection.

## 2016-10-01 ENCOUNTER — Other Ambulatory Visit: Payer: Self-pay | Admitting: Nurse Practitioner

## 2016-10-11 ENCOUNTER — Other Ambulatory Visit (HOSPITAL_BASED_OUTPATIENT_CLINIC_OR_DEPARTMENT_OTHER): Payer: Medicare Other

## 2016-10-11 ENCOUNTER — Telehealth: Payer: Self-pay | Admitting: *Deleted

## 2016-10-11 ENCOUNTER — Ambulatory Visit (HOSPITAL_BASED_OUTPATIENT_CLINIC_OR_DEPARTMENT_OTHER): Payer: Medicare Other | Admitting: Hematology & Oncology

## 2016-10-11 VITALS — BP 137/86 | HR 68 | Temp 99.0°F | Wt 163.0 lb

## 2016-10-11 DIAGNOSIS — D500 Iron deficiency anemia secondary to blood loss (chronic): Secondary | ICD-10-CM | POA: Diagnosis not present

## 2016-10-11 DIAGNOSIS — C779 Secondary and unspecified malignant neoplasm of lymph node, unspecified: Secondary | ICD-10-CM

## 2016-10-11 DIAGNOSIS — C50412 Malignant neoplasm of upper-outer quadrant of left female breast: Secondary | ICD-10-CM | POA: Diagnosis not present

## 2016-10-11 DIAGNOSIS — Z170 Estrogen receptor positive status [ER+]: Secondary | ICD-10-CM

## 2016-10-11 LAB — IRON AND TIBC
%SAT: 26 % (ref 21–57)
Iron: 85 ug/dL (ref 41–142)
TIBC: 323 ug/dL (ref 236–444)
UIBC: 238 ug/dL (ref 120–384)

## 2016-10-11 LAB — CBC WITH DIFFERENTIAL (CANCER CENTER ONLY)
BASO#: 0 10*3/uL (ref 0.0–0.2)
BASO%: 0.8 % (ref 0.0–2.0)
EOS%: 3.1 % (ref 0.0–7.0)
Eosinophils Absolute: 0.2 10*3/uL (ref 0.0–0.5)
HCT: 40.7 % (ref 34.8–46.6)
HGB: 13.6 g/dL (ref 11.6–15.9)
LYMPH#: 1.2 10*3/uL (ref 0.9–3.3)
LYMPH%: 22.5 % (ref 14.0–48.0)
MCH: 32.8 pg (ref 26.0–34.0)
MCHC: 33.4 g/dL (ref 32.0–36.0)
MCV: 98 fL (ref 81–101)
MONO#: 0.4 10*3/uL (ref 0.1–0.9)
MONO%: 8.2 % (ref 0.0–13.0)
NEUT#: 3.4 10*3/uL (ref 1.5–6.5)
NEUT%: 65.4 % (ref 39.6–80.0)
Platelets: 122 10*3/uL — ABNORMAL LOW (ref 145–400)
RBC: 4.15 10*6/uL (ref 3.70–5.32)
RDW: 12.8 % (ref 11.1–15.7)
WBC: 5.2 10*3/uL (ref 3.9–10.0)

## 2016-10-11 LAB — COMPREHENSIVE METABOLIC PANEL
ALT: 10 U/L (ref 0–55)
AST: 13 U/L (ref 5–34)
Albumin: 4.1 g/dL (ref 3.5–5.0)
Alkaline Phosphatase: 52 U/L (ref 40–150)
Anion Gap: 8 mEq/L (ref 3–11)
BUN: 20.3 mg/dL (ref 7.0–26.0)
CO2: 25 mEq/L (ref 22–29)
Calcium: 9.5 mg/dL (ref 8.4–10.4)
Chloride: 108 mEq/L (ref 98–109)
Creatinine: 1.1 mg/dL (ref 0.6–1.1)
EGFR: 46 mL/min/{1.73_m2} — ABNORMAL LOW (ref >90)
Glucose: 94 mg/dl (ref 70–140)
Potassium: 4.1 mEq/L (ref 3.5–5.1)
Sodium: 141 mEq/L (ref 136–145)
Total Bilirubin: 0.83 mg/dL (ref 0.20–1.20)
Total Protein: 6.9 g/dL (ref 6.4–8.3)

## 2016-10-11 LAB — FERRITIN: Ferritin: 100 ng/ml (ref 9–269)

## 2016-10-11 NOTE — Telephone Encounter (Addendum)
Patient is aware of results  ----- Message from Eliezer Bottom, NP sent at 10/11/2016 11:36 AM EST ----- Regarding: Iron  Iron studies look good. No infusion needed. Thank you!  Kim [name]  ----- Message ----- From: Interface, Lab In Three [facility] One Sent: 10/11/2016   8:59 AM To: Eliezer Bottom, NP

## 2016-10-11 NOTE — Progress Notes (Signed)
Hematology and Oncology Follow Up Visit  Kim [name] [id number] [birth year] 82 y.o. 10/11/2016   Principle Diagnosis:  Stage IIA (T1N1M0) adenocarcinoma of the left breast-triple positive Iron deficiency anemia secondary to blood loss  Current Therapy:   Status post cycle 4 of Abraxane/Herceptin Maintenance Herceptin q 3wk - finish 6/21 Prolia 60gm sq every 6 months - due in April 2018 Femara 2.5 mg by mouth daily  Feraheme as needed-dose given today     Interim History:  [name] is back for followup. She is doing quite well. She enjoyed the holidays. She and her husband state home.  She has had no problems with cough. She's had no fever. She's had no change in bowel or bladder habits. She's had no rashes.  There's been no issues with weight loss or weight gain. I think she sees her family doctor soon for a physical exam.   She is due for a mammogram this or next month.  She has a performance status of ECOG 1.  Medications:  Current Outpatient Prescriptions:  .  acetaminophen (TYLENOL) 650 MG CR tablet, Take 650 mg by mouth every 8 (eight) hours as needed for pain. , Disp: , Rfl:  .  Ascorbic Acid (VITAMIN C) 1000 MG tablet, Take 1,000 mg by mouth daily., Disp: , Rfl:  .  aspirin EC 81 MG EC tablet, Take 1 tablet (81 mg total) by mouth daily., Disp: , Rfl:  .  Calcium Citrate-Vitamin D (CITRACAL MAXIMUM PO), Take 1 tablet by mouth daily. , Disp: , Rfl:  .  cetirizine (ZYRTEC) 10 MG tablet, Take 10 mg by mouth daily as needed for allergies., Disp: , Rfl:  .  ciprofloxacin (CIPRO) 500 MG tablet, Take 1 tablet (500 mg total) by mouth 2 (two) times daily., Disp: 6 tablet, Rfl: 0 .  ciprofloxacin (CIPRO) 500 MG tablet, Take 1 tablet (500 mg total) by mouth 2 (two) times daily., Disp: 14 tablet, Rfl: 0 .  Coenzyme Q10 200 MG TABS, One tablet by mouth daily, Disp: , Rfl: 0 .  Cyanocobalamin (VITAMIN B-12) 2500 MCG SUBL, Place under the tongue as needed., Disp: , Rfl:  .   furosemide (LASIX) 20 MG tablet, Take 1 tablet (20 mg total) by mouth daily. Take 1 tablet (20 mg total) every day x 3 days then take once daily as needed, Disp: 30 tablet, Rfl: 0 .  gabapentin (NEURONTIN) 100 MG capsule, Take 1 capsule (100 mg total) by mouth 3 (three) times daily. (Patient not taking: Reported on 09/16/2016), Disp: 270 capsule, Rfl: 0 .  letrozole (FEMARA) 2.5 MG tablet, Take 1 tablet (2.5 mg total) by mouth daily., Disp: 30 tablet, Rfl: 6 .  levothyroxine (SYNTHROID, LEVOTHROID) 112 MCG tablet, TAKE 1 TABLET BY MOUTH ONCE DAILY, Disp: 90 tablet, Rfl: 1 .  lidocaine-prilocaine (EMLA) cream, Apply topically as needed., Disp: 30 g, Rfl: 6 .  Loperamide HCl (IMODIUM PO), Take by mouth as needed., Disp: , Rfl:  .  metoprolol succinate (TOPROL-XL) 50 MG 24 hr tablet, TAKE 1 TABLET BY MOUTH TWICE DAILY WITH OR IMMEDIATELY FOLLOWING A MEAL, Disp: 60 tablet, Rfl: 3 .  Multiple Vitamins-Minerals (CENTRUM ADULTS PO), Take by mouth., Disp: , Rfl:  .  nitroGLYCERIN (NITROSTAT) 0.4 MG SL tablet, Place 1 tablet (0.4 mg total) under the tongue every 5 (five) minutes x 3 doses as needed for chest pain., Disp: 25 tablet, Rfl: 3  Allergies:  Allergies  Allergen Reactions  . Propoxyphene N-Acetaminophen Swelling  tongue swelling  . Atorvastatin Other (See Comments)    Bone and muscle pain  . Penicillins Swelling    Facial swelling  . Pravastatin Other (See Comments)    Bone and muscle pain  . Repatha [Evolocumab] Other (See Comments)    Myalgias, fatigue, some itching and burning on her feet  . Rosuvastatin Other (See Comments)    Bone and muscle pain  . Cortisone Rash  . Myrbetriq [Mirabegron]     Rash on face  . Pseudoephedrine Hcl Er Rash and Other (See Comments)    Face gets red    Past Medical History, Surgical history, Social history, and Family History were reviewed and updated.  Review of Systems: As above  Physical Exam:  weight is 163 lb (73.9 kg). Her oral  temperature is 99 F (37.2 C). Her blood pressure is 137/86 and her pulse is 68.   Well-developed and well-nourished white female. Her head and neck exam shows no ocular or oral lesions. She has no palpable cervical or supraclavicular lymph nodes. Lungs are clear bilaterally. Cardiac exam regular rate and rhythm with a 2/6 systolic ejection murmur. Breast exam shows right breast with no masses edema or erythema. There is no right axillary adenopathy. Left breast shows well-healed lumpectomy at the 2:00 position. There might be a little bit of erythema. The might be a little bit of swelling. There is no tenderness at the lumpectomy site. There is no masses in the left breast. There is no left axillary adenopathy.. Abdomen is soft. She's good bowel sounds. There is no palpable liver or spleen tip. Back exam shows no tenderness over the spine ribs or hips. Extremities shows no clubbing cyanosis or edema. Neurological exam shows no focal neurological deficits.  Lab Results  Component Value Date   WBC 5.2 10/11/2016   HGB 13.6 10/11/2016   HCT 40.7 10/11/2016   MCV 98 10/11/2016   PLT 122 (L) 10/11/2016     Chemistry      Component Value Date/Time   NA 135 07/12/2016 0956   NA 141 10/07/2015 0820   K 4.0 07/12/2016 0956   K 4.6 10/07/2015 0820   CL 104 07/12/2016 0956   CO2 28 07/12/2016 0956   CO2 26 10/07/2015 0820   BUN 13 07/12/2016 0956   BUN 20.7 10/07/2015 0820   CREATININE 1.0 07/12/2016 0956   CREATININE 1.1 10/07/2015 0820      Component Value Date/Time   CALCIUM 8.9 07/12/2016 0956   CALCIUM 9.7 10/07/2015 0820   ALKPHOS 35 07/19/2016 0811   ALKPHOS 36 07/12/2016 0956   ALKPHOS 60 10/07/2015 0820   AST 13 07/19/2016 0811   AST 15 07/12/2016 0956   AST 13 10/07/2015 0820   ALT 9 07/19/2016 0811   ALT 14 07/12/2016 0956   ALT 9 10/07/2015 0820   BILITOT 0.8 07/19/2016 0811   BILITOT 0.90 07/12/2016 0956   BILITOT 0.54 10/07/2015 0820         Impression and  Plan: Kim [name] is 82 year old white female with stage IIA ductal carcinoma of the left breast. She had 4 positive lymph nodes. Her breast cancer was triple positive. She completed her adjuvant chemotherapy. She completed this in July of 2015.  We will continue her on Femara. She will need 10 years of Femara.  The iron definitely helped her hemoglobin. This continues to improve.  I'll see her back in 3 months. We will give her Prolia, we see her back.  I am just  grateful that she has done so well.  I know she will have a wonderful 82th birthday next month.  I spent about 25-30 minutes with her and her husband.  [name], MD 1/8/20189:40 AM

## 2016-10-15 ENCOUNTER — Other Ambulatory Visit: Payer: Self-pay | Admitting: Family Medicine

## 2016-10-15 DIAGNOSIS — Z853 Personal history of malignant neoplasm of breast: Secondary | ICD-10-CM

## 2016-10-16 ENCOUNTER — Other Ambulatory Visit: Payer: Self-pay | Admitting: Family Medicine

## 2016-10-18 NOTE — Progress Notes (Signed)
Cardiology Office Note   Date:  10/19/2016   ID:  Kim [name], DOB 1935-06-30, MRN [id number]  PCP:  Alysia Penna, MD  Cardiologist:  Dr. Aundra Dubin    Chief Complaint  Patient presents with  . Coronary Artery Disease  . Shortness of Breath    sometimes       History of Present Illness: Kim [name] is a 82 y.o. female who presents for CAD and HOCM.    She has a history of CAD and HOCM saw Dr. Aundra Dubin for cardiology evaluation given use of Herceptin with her breast cancer chemo.  Patient is s/p lumpectomy with lymph node biopsy in 2/15.  4/10 nodes positive.  She was started on docetaxol/carboplatin/Herceptin in 3/15 with plan for 6 cycles chemo. She had a tough time with her 1st chemotherapy session and felt very weak afterwards.  Shortly after, she developed unstable angina and ended up getting a DES to the mid RCA in 3/15.  Patient additionally has a history of HOCM.  This has been recognized on prior echoes.  She has severe asymmetric basal septal hypertrophy and SAM with LVOT gradient peak 58 mmHg on echo in 3/15 along with moderate MR.  Repeat echo in 7/15 showed LVOT gradient down to 30 mmHg on higher beta blocker.  Repeat echo in 11/15 showed no significant LVOT gradient but SAM still present.  Echo (8/16) showed asymmetric septal hypertrophy, No SAM, no significant LVOT gradient.    Has not tolerated statins due to myalgias. She tried Repatha and while this lowered cholesterol she also developed myalgias.     Today she has occ SOB but mostly feels well no chest pain and no significant SOB. No recent illnesses. Recent labs were done with PCP.   BP elevated on arrival but on recheck 144/72.   Past Medical History:  Diagnosis Date  . Allergic rhinitis   . Allergy   . Arthritis    hands  . Breast cancer of upper-outer quadrant of left female breast ([facility]) 11/08/13   finished chemo and radiation 2015  . CAD (coronary artery disease)    a. 12/2013 Cath/PCI: LM nl, LAD 20p, 24m,  30d, D1 30p, LCX 20p, 26m, OM1 20, Mo2 40p, RCA 30p, 78m (4.0x16 Promus Premier DES), 20d, RPL 30, RPDA 70p, 70m, EF 65-70%.  . Diverticulosis   . HOH (hard of hearing)    bilateral, wears hearing aids  . Hx of adenomatous colonic polyps 02/1999  . Hyperlipidemia   . Hypertension   . Hypothyroidism   . Iron deficiency anemia due to chronic blood loss 01/07/2016  . Low back pain    gets ESI from Dr. Rennis Harding  . Malabsorption of iron 01/07/2016  . Myocardial infarction 12/2013   very mild-with first chemo -placed stent x1  . S/P radiation therapy 05/14/2014-06/26/2014   1) Left Breast  / 50 Gy in 25 fractions, 2) Left Supraclavicular fossa/ 47.5 Gy in 25 fractions, 3) Left Posterior Axillary boost / 4.825 Gy in 25 fractions, 4) Left Breast boost / 10 Gy in 5 fractions   . SVD (spontaneous vaginal delivery)    x 2  . Tubulovillous adenoma of colon 12/2009   with HGD  . Urinary tract infection   . Wears hearing aid    left and right     Past Surgical History:  Procedure Laterality Date  . ABDOMINAL HYSTERECTOMY    . ANTERIOR AND POSTERIOR REPAIR N/A 01/17/2015   Procedure: CYSTOCELE REPAIR ;  Surgeon: Princess Bruins, MD;  Location: [facility];  Service: Gynecology;  Laterality: N/A;  . BILATERAL SALPINGOOPHORECTOMY     see Laurin Coder NP for GYN exams  . BREAST LUMPECTOMY WITH NEEDLE LOCALIZATION AND AXILLARY LYMPH NODE DISSECTION Left 11/05/2013   Procedure: LEFT BREAST LUMPECTOMY WITH NEEDLE LOCALIZATION and axillary lymph Node Dissection;  Surgeon: Edward Jolly, MD;  Location: [facility];  Service: General;  Laterality: Left;  . BREAST SURGERY  11/2013   left lumpectomy  . CATARACT EXTRACTION W/ INTRAOCULAR LENS  IMPLANT, BILATERAL  2012   bilateral  . COLONOSCOPY  01-14-12   per Dr. Fuller Plan, adenomatous polyps, repeat in 3 years   . EYE SURGERY  11/22/2006   cataracts, bilateral, intraocular lens implant  . LEFT HEART CATHETERIZATION WITH CORONARY ANGIOGRAM N/A 12/19/2013     Procedure: LEFT HEART CATHETERIZATION WITH CORONARY ANGIOGRAM;  Surgeon: Burnell Blanks, MD;  Location[facility];  Service: Cardiovascular;  Laterality: N/A;  . lumbar epidural steroid injection     lumber spinal stenosis  . POLYPECTOMY    . PORTACATH PLACEMENT Right 12/11/2013   Procedure: INSERTION PORT-A-CATH;  Surgeon: Edward Jolly, MD;  Location: [facility];  Service: General;  Laterality: Right;  Subclavian Vein;      Current Outpatient Prescriptions  Medication Sig Dispense Refill  . acetaminophen (TYLENOL) 650 MG CR tablet Take 650 mg by mouth every 8 (eight) hours as needed for pain.     . Ascorbic Acid (VITAMIN C) 1000 MG tablet Take 1,000 mg by mouth daily.    Marland Kitchen aspirin EC 81 MG EC tablet Take 1 tablet (81 mg total) by mouth daily.    . Calcium Citrate-Vitamin D (CITRACAL MAXIMUM PO) Take 1 tablet by mouth daily.     . cetirizine (ZYRTEC) 10 MG tablet Take 10 mg by mouth daily as needed for allergies.    . ciprofloxacin (CIPRO) 500 MG tablet Take 1 tablet (500 mg total) by mouth 2 (two) times daily. 6 tablet 0  . ciprofloxacin (CIPRO) 500 MG tablet Take 1 tablet (500 mg total) by mouth 2 (two) times daily. 14 tablet 0  . Coenzyme Q10 200 MG TABS One tablet by mouth daily  0  . Cyanocobalamin (VITAMIN B-12) 2500 MCG SUBL Place under the tongue as needed.    . furosemide (LASIX) 20 MG tablet Take 1 tablet (20 mg total) by mouth daily. Take 1 tablet (20 mg total) every day x 3 days then take once daily as needed 30 tablet 0  . gabapentin (NEURONTIN) 100 MG capsule Take 1 capsule (100 mg total) by mouth 3 (three) times daily. 270 capsule 0  . letrozole (FEMARA) 2.5 MG tablet Take 1 tablet (2.5 mg total) by mouth daily. 30 tablet 6  . levothyroxine (SYNTHROID, LEVOTHROID) 112 MCG tablet TAKE 1 TABLET BY MOUTH ONCE DAILY 90 tablet 1  . levothyroxine (SYNTHROID, LEVOTHROID) 112 MCG tablet TAKE 1 TABLET BY MOUTH ONCE DAILY 90 tablet 0  .  lidocaine-prilocaine (EMLA) cream Apply topically as needed. 30 g 6  . Loperamide HCl (IMODIUM PO) Take by mouth as needed.    . metoprolol succinate (TOPROL-XL) 50 MG 24 hr tablet TAKE 1 TABLET BY MOUTH TWICE DAILY WITH OR IMMEDIATELY FOLLOWING A MEAL 60 tablet 3  . Multiple Vitamins-Minerals (CENTRUM ADULTS PO) Take by mouth.    . nitroGLYCERIN (NITROSTAT) 0.4 MG SL tablet Place 1 tablet (0.4 mg total) under the tongue every 5 (five) minutes x  3 doses as needed for chest pain. 25 tablet 3   No current facility-administered medications for this visit.     Allergies:   Propoxyphene n-acetaminophen; Atorvastatin; Penicillins; Pravastatin; Repatha [evolocumab]; Rosuvastatin; Cortisone; Myrbetriq [mirabegron]; and Pseudoephedrine hcl er    Social History:  The patient  reports that she has never smoked. She has never used smokeless tobacco. She reports that she does not drink alcohol or use drugs.   Family History:  The patient's family history includes Alcohol abuse in her father; Kidney failure in her mother.    ROS:  General:no colds or fevers, no weight changes Skin:no rashes or ulcers HEENT:no blurred vision, no congestion CV:see HPI PUL:see HPI GI:no diarrhea constipation or melena, no indigestion GU:no hematuria, no dysuria MS:no joint pain, no claudication Neuro:no syncope, no lightheadedness Endo:no diabetes, no thyroid disease  Wt Readings from Last 3 Encounters:  10/19/16 164 lb 6.4 oz (74.6 kg)  10/11/16 163 lb (73.9 kg)  09/16/16 162 lb (73.5 kg)     PHYSICAL EXAM: VS:  BP (!) 156/80   Pulse 61   Ht 5\' 8"  (1.727 m)   Wt 164 lb 6.4 oz (74.6 kg)   LMP 10/19/1991   SpO2 98%   BMI 25.00 kg/m  , BMI Body mass index is 25 kg/m. General:Pleasant affect, NAD Skin:Warm and dry, brisk capillary refill HEENT:normocephalic, sclera clear, mucus membranes moist Neck:supple, no JVD, no bruits  Heart:S1S2 RRR with 2/6 SEM, no gallup, rub or click Lungs:clear without rales,  rhonchi, or wheezes [id number], non tender, + BS, do not palpate liver spleen or masses Ext:no lower ext edema, 2+ pedal pulses, 2+ radial pulses Neuro:alert and oriented X 3, MAE, follows commands, + facial symmetry    EKG:  EKG is NOT ordered today.    Recent Labs: 04/07/2016: TSH 1.22 10/11/2016: ALT 10; BUN 20.3; Creatinine 1.1; HGB 13.6; Platelets 122; Potassium 4.1; Sodium 141    Lipid Panel    Component Value Date/Time   CHOL 124 (L) 07/19/2016 0811   TRIG 138 07/19/2016 0811   HDL 46 07/19/2016 0811   CHOLHDL 2.7 07/19/2016 0811   VLDL 28 07/19/2016 0811   LDLCALC 50 07/19/2016 0811   LDLDIRECT 121.2 07/24/2014 0900       Other studies Reviewed: Additional studies/ records that were reviewed today include: .  Echo: Study Conclusions  - Left ventricle: Asymetric hypertrophy with SAM. LVOT gradient   poorly defined but velocity at rest likely under 58m/sec. The   cavity size was normal. Wall thickness was increased in a pattern   of severe LVH. Systolic function was normal. The estimated   ejection fraction was in the range of 60% to 65%. - Mitral valve: There was mild regurgitation. - Left atrium: The atrium was moderately dilated. - Atrial septum: No defect or patent foramen ovale was identified.   ASSESSMENT AND PLAN:  1. CAD: Status post PCI for unstable angina in 3/15 with DES to [facility]. She is now off Plavix. No angina. - Continue ASA 81 and Toprol XL.     - Unable to tolerate statins.   2. Hyperlipidemia: Myalgias with Crestor, Lipitor, and pravastatin. Similar reaction to Repatha  3. Hypertrophic obstructive cardiomyopathy: No family history of HOCM or sudden death (interestingly, her husband has HOCM).  Most recent echo in 8/16 did not show a significant LVOT gradient.  She continues on Toprol XL.  Cardiac MRI in 5/15 showed no delayed enhancement.  - Continue current Toprol XL.  - Per patient,  her 2 sons (in their 34s) have had screening echoes to look  for hypertrophic cardiomyopathy and did not show signs of HOCM.   - plan echo in 5 months prior to next appt.       Will check with Dr. Aundra Dubin to see if follow up with him or MD here.   Current medicines are reviewed with the patient today.  The patient Has no concerns regarding medicines.  The following changes have been made:  See above Labs/ tests ordered today include:see above  Disposition:   FU:  see above  Signed, Cecilie Kicks, NP  10/19/2016 9:00 AM    [facility] [address], [address] [place], [place] Phone: [phone]; Fax: [phone]

## 2016-10-19 ENCOUNTER — Ambulatory Visit (INDEPENDENT_AMBULATORY_CARE_PROVIDER_SITE_OTHER): Payer: Medicare Other | Admitting: Cardiology

## 2016-10-19 ENCOUNTER — Encounter: Payer: Self-pay | Admitting: Cardiology

## 2016-10-19 VITALS — BP 156/80 | HR 61 | Ht 68.0 in | Wt 164.4 lb

## 2016-10-19 DIAGNOSIS — E782 Mixed hyperlipidemia: Secondary | ICD-10-CM

## 2016-10-19 DIAGNOSIS — I2510 Atherosclerotic heart disease of native coronary artery without angina pectoris: Secondary | ICD-10-CM | POA: Diagnosis not present

## 2016-10-19 DIAGNOSIS — I421 Obstructive hypertrophic cardiomyopathy: Secondary | ICD-10-CM | POA: Diagnosis not present

## 2016-10-19 NOTE — Patient Instructions (Addendum)
Your physician recommends that you continue on your current medications as directed. Please refer to the Current Medication list given to you today.  Your physician has requested that you have an echocardiogram. Echocardiography is a painless test that uses sound waves to create images of your heart. It provides your doctor with information about the size and shape of your heart and how well your heart's chambers and valves are working. This procedure takes approximately one hour. There are no restrictions for this procedure. IN   5 MONTHS    Your physician wants you to follow-up in: [place] ?    You will receive a reminder letter in the mail two months in advance. If you don't receive a letter, please call our office to schedule the follow-up appointment.

## 2016-10-21 ENCOUNTER — Other Ambulatory Visit: Payer: Medicare Other

## 2016-10-22 ENCOUNTER — Other Ambulatory Visit: Payer: Medicare Other

## 2016-10-25 ENCOUNTER — Encounter: Payer: Self-pay | Admitting: Family Medicine

## 2016-10-25 ENCOUNTER — Other Ambulatory Visit (INDEPENDENT_AMBULATORY_CARE_PROVIDER_SITE_OTHER): Payer: Medicare Other

## 2016-10-25 ENCOUNTER — Ambulatory Visit (INDEPENDENT_AMBULATORY_CARE_PROVIDER_SITE_OTHER): Payer: Medicare Other | Admitting: Family Medicine

## 2016-10-25 VITALS — BP 167/102 | HR 64 | Temp 98.1°F | Ht 68.0 in | Wt 162.0 lb

## 2016-10-25 DIAGNOSIS — Z0000 Encounter for general adult medical examination without abnormal findings: Secondary | ICD-10-CM

## 2016-10-25 DIAGNOSIS — N390 Urinary tract infection, site not specified: Secondary | ICD-10-CM

## 2016-10-25 LAB — POC URINALSYSI DIPSTICK (AUTOMATED)
Bilirubin, UA: NEGATIVE
Glucose, UA: NEGATIVE
Ketones, UA: NEGATIVE
Nitrite, UA: POSITIVE
Protein, UA: NEGATIVE
Spec Grav, UA: 1.025
Urobilinogen, UA: 0.2
pH, UA: 5

## 2016-10-25 LAB — CBC WITH DIFFERENTIAL/PLATELET
Basophils Absolute: 0 10*3/uL (ref 0.0–0.1)
Basophils Relative: 0.7 % (ref 0.0–3.0)
Eosinophils Absolute: 0.2 10*3/uL (ref 0.0–0.7)
Eosinophils Relative: 3.3 % (ref 0.0–5.0)
HCT: 39.9 % (ref 36.0–46.0)
Hemoglobin: 13.7 g/dL (ref 12.0–15.0)
Lymphocytes Relative: 21.7 % (ref 12.0–46.0)
Lymphs Abs: 1.1 10*3/uL (ref 0.7–4.0)
MCHC: 34.3 g/dL (ref 30.0–36.0)
MCV: 95.1 fl (ref 78.0–100.0)
Monocytes Absolute: 0.5 10*3/uL (ref 0.1–1.0)
Monocytes Relative: 8.8 % (ref 3.0–12.0)
Neutro Abs: 3.4 10*3/uL (ref 1.4–7.7)
Neutrophils Relative %: 65.5 % (ref 43.0–77.0)
Platelets: 120 10*3/uL — ABNORMAL LOW (ref 150.0–400.0)
RBC: 4.2 Mil/uL (ref 3.87–5.11)
RDW: 13.8 % (ref 11.5–15.5)
WBC: 5.2 10*3/uL (ref 4.0–10.5)

## 2016-10-25 LAB — BASIC METABOLIC PANEL
BUN: 21 mg/dL (ref 6–23)
CO2: 30 mEq/L (ref 19–32)
Calcium: 9.5 mg/dL (ref 8.4–10.5)
Chloride: 103 mEq/L (ref 96–112)
Creatinine, Ser: 1.02 mg/dL (ref 0.40–1.20)
GFR: 55.43 mL/min — ABNORMAL LOW (ref >60.00)
Glucose, Bld: 86 mg/dL (ref 70–99)
Potassium: 4.2 mEq/L (ref 3.5–5.1)
Sodium: 140 mEq/L (ref 135–145)

## 2016-10-25 LAB — HEPATIC FUNCTION PANEL
ALT: 9 U/L (ref 0–35)
AST: 10 U/L (ref 0–37)
Albumin: 4.3 g/dL (ref 3.5–5.2)
Alkaline Phosphatase: 46 U/L (ref 39–117)
Bilirubin, Direct: 0.1 mg/dL (ref 0.0–0.3)
Total Bilirubin: 1 mg/dL (ref 0.2–1.2)
Total Protein: 6.7 g/dL (ref 6.0–8.3)

## 2016-10-25 LAB — LIPID PANEL
Cholesterol: 217 mg/dL — ABNORMAL HIGH (ref 0–200)
HDL: 39.4 mg/dL (ref >39.00)
LDL Cholesterol: 147 mg/dL — ABNORMAL HIGH (ref 0–99)
NonHDL: 177.82
Total CHOL/HDL Ratio: 6
Triglycerides: 155 mg/dL — ABNORMAL HIGH (ref 0.0–149.0)
VLDL: 31 mg/dL (ref 0.0–40.0)

## 2016-10-25 LAB — TSH: TSH: 2.71 u[IU]/mL (ref 0.35–4.50)

## 2016-10-25 NOTE — Progress Notes (Signed)
   Subjective:    Patient ID: Kim [name], female    DOB: 1934/11/09, 82 y.o.   MRN: [id number]  HPI 82 yr old female for a well exam. She feels well except she mentions frequent urinations for a few weeks. No burning or fever. She saw Cardiology last week and they seemed satisfied with how she is doing.    Review of Systems  Constitutional: Negative.   HENT: Negative.   Eyes: Negative.   Respiratory: Negative.   Cardiovascular: Negative.   Gastrointestinal: Negative.   Genitourinary: Positive for frequency. Negative for decreased urine volume, difficulty urinating, dyspareunia, dysuria, enuresis, flank pain, hematuria, pelvic pain and urgency.  Musculoskeletal: Negative.   Skin: Negative.   Neurological: Negative.   Psychiatric/Behavioral: Negative.        Objective:   Physical Exam  Constitutional: She is oriented to person, place, and time. She appears well-developed and well-nourished. No distress.  HENT:  Head: Normocephalic and atraumatic.  Right Ear: External ear normal.  Left Ear: External ear normal.  Nose: Nose normal.  Mouth/Throat: Oropharynx is clear and moist. No oropharyngeal exudate.  Eyes: Conjunctivae and EOM are normal. Pupils are equal, round, and reactive to light. No scleral icterus.  Neck: Normal range of motion. Neck supple. No JVD present. No thyromegaly present.  Cardiovascular: Normal rate, regular rhythm, normal heart sounds and intact distal pulses.  Exam reveals no gallop and no friction rub.   No murmur heard. Pulmonary/Chest: Effort normal and breath sounds normal. No respiratory distress. She has no wheezes. She has no rales. She exhibits no tenderness.  Abdominal: Soft. Bowel sounds are normal. She exhibits no distension and no mass. There is no tenderness. There is no rebound and no guarding.  Musculoskeletal: Normal range of motion. She exhibits no edema or tenderness.  Lymphadenopathy:    She has no cervical adenopathy.  Neurological:  She is alert and oriented to person, place, and time. She has normal reflexes. No cranial nerve deficit. She exhibits normal muscle tone. Coordination normal.  Skin: Skin is warm and dry. No rash noted. No erythema.  Psychiatric: She has a normal mood and affect. Her behavior is normal. Judgment and thought content normal.          Assessment & Plan:  Well exam. Get fasting labs today. We discussed diet and exercise. Refer to Urology for frequent UTIs.  Alysia Penna, MD

## 2016-10-25 NOTE — Progress Notes (Signed)
Pre visit review using our clinic review tool, if applicable. No additional management support is needed unless otherwise documented below in the visit note. 

## 2016-10-26 ENCOUNTER — Ambulatory Visit
Admission: RE | Admit: 2016-10-26 | Discharge: 2016-10-26 | Disposition: A | Discharge status: Home / Self Care | Payer: Medicare Other | Source: Ambulatory Visit | Attending: Family Medicine | Admitting: Family Medicine

## 2016-10-26 DIAGNOSIS — Z853 Personal history of malignant neoplasm of breast: Secondary | ICD-10-CM

## 2016-11-02 ENCOUNTER — Telehealth: Payer: Self-pay | Admitting: Family Medicine

## 2016-11-02 NOTE — Telephone Encounter (Signed)
Spoke with pt and advised as to lab results. Answered her questions and reviewed all results. Nothing further needed at this time.

## 2016-11-02 NOTE — Telephone Encounter (Signed)
Pt would like you to call them concerning lab results °

## 2016-11-19 ENCOUNTER — Telehealth: Payer: Self-pay | Admitting: Family Medicine

## 2016-11-19 DIAGNOSIS — E039 Hypothyroidism, unspecified: Secondary | ICD-10-CM

## 2016-11-19 NOTE — Telephone Encounter (Signed)
Can you call pt to schedule lab draw?

## 2016-11-19 NOTE — Telephone Encounter (Signed)
Have her come in for a full thyroid panel (with T3 and T4). I will put in the orders.

## 2016-11-19 NOTE — Telephone Encounter (Signed)
I spoke with pt and hair is still falling out, wants to know what further thyroid testing can be done?

## 2016-11-19 NOTE — Telephone Encounter (Signed)
Pt had tsh test and his some ? About hair loss and would like sylvia to return her call today

## 2016-11-19 NOTE — Telephone Encounter (Signed)
lmom for pt to call back

## 2016-11-22 NOTE — Telephone Encounter (Signed)
Pt scheduled  

## 2016-11-23 ENCOUNTER — Other Ambulatory Visit (INDEPENDENT_AMBULATORY_CARE_PROVIDER_SITE_OTHER): Payer: Medicare Other

## 2016-11-23 DIAGNOSIS — E039 Hypothyroidism, unspecified: Secondary | ICD-10-CM

## 2016-11-23 LAB — TSH: TSH: 2.23 u[IU]/mL (ref 0.35–4.50)

## 2016-11-23 LAB — T3, FREE: T3, Free: 2.9 pg/mL (ref 2.3–4.2)

## 2016-11-23 LAB — T4, FREE: Free T4: 1.18 ng/dL (ref 0.60–1.60)

## 2016-11-26 ENCOUNTER — Telehealth: Payer: Self-pay | Admitting: *Deleted

## 2016-11-26 ENCOUNTER — Telehealth: Payer: Self-pay | Admitting: Family Medicine

## 2016-11-26 NOTE — Telephone Encounter (Signed)
Patient c/o hair loss. She believes it's due to her letrozole. She would like to stop the letrozole or have it changed to something else.  Spoke to Dr Marin Olp. Patient is scheduled to see him next on 01/10/17. He would like patient to stop medication until he can see her in April.   Patient understands instructions

## 2016-11-26 NOTE — Telephone Encounter (Signed)
Pt is returning paty call °

## 2016-11-26 NOTE — Telephone Encounter (Signed)
Spoke with pt informed her that she has normal thyroid levels.

## 2016-12-29 ENCOUNTER — Ambulatory Visit (HOSPITAL_COMMUNITY): Payer: Medicare Other | Attending: Cardiovascular Disease

## 2016-12-29 ENCOUNTER — Telehealth: Payer: Self-pay | Admitting: Cardiology

## 2016-12-29 ENCOUNTER — Other Ambulatory Visit: Payer: Self-pay

## 2016-12-29 DIAGNOSIS — I421 Obstructive hypertrophic cardiomyopathy: Secondary | ICD-10-CM | POA: Insufficient documentation

## 2016-12-29 DIAGNOSIS — I083 Combined rheumatic disorders of mitral, aortic and tricuspid valves: Secondary | ICD-10-CM | POA: Insufficient documentation

## 2016-12-29 DIAGNOSIS — I509 Heart failure, unspecified: Secondary | ICD-10-CM | POA: Insufficient documentation

## 2016-12-29 DIAGNOSIS — I371 Nonrheumatic pulmonary valve insufficiency: Secondary | ICD-10-CM | POA: Diagnosis not present

## 2016-12-29 NOTE — Telephone Encounter (Signed)
New Message     Pt returning your call about echo results

## 2016-12-30 NOTE — Telephone Encounter (Signed)
Returned pts call and have discussed her echo results.  Pt has been advised to f/u with Dr. Aundra Dubin in July, and was given the office # to schedule  Pt verbalized appreciation.

## 2016-12-30 NOTE — Telephone Encounter (Signed)
-----   Message from Isaiah Serge, NP sent at 12/29/2016 12:31 PM EDT ----- Dr.McLean this echo seems stable from 2016, I was going to have pt see you for visit in July she was stable when I saw her.  Let me know if you have any other recommendations.  Thanks

## 2017-01-10 ENCOUNTER — Ambulatory Visit (HOSPITAL_BASED_OUTPATIENT_CLINIC_OR_DEPARTMENT_OTHER): Payer: Medicare Other | Admitting: Hematology & Oncology

## 2017-01-10 ENCOUNTER — Ambulatory Visit (HOSPITAL_BASED_OUTPATIENT_CLINIC_OR_DEPARTMENT_OTHER): Payer: Medicare Other

## 2017-01-10 ENCOUNTER — Other Ambulatory Visit (HOSPITAL_BASED_OUTPATIENT_CLINIC_OR_DEPARTMENT_OTHER): Payer: Medicare Other

## 2017-01-10 ENCOUNTER — Other Ambulatory Visit: Payer: Self-pay | Admitting: *Deleted

## 2017-01-10 VITALS — BP 137/71 | HR 18 | Temp 99.0°F | Wt 167.0 lb

## 2017-01-10 DIAGNOSIS — C779 Secondary and unspecified malignant neoplasm of lymph node, unspecified: Secondary | ICD-10-CM

## 2017-01-10 DIAGNOSIS — Z170 Estrogen receptor positive status [ER+]: Secondary | ICD-10-CM

## 2017-01-10 DIAGNOSIS — C50412 Malignant neoplasm of upper-outer quadrant of left female breast: Secondary | ICD-10-CM | POA: Diagnosis not present

## 2017-01-10 DIAGNOSIS — Z79811 Long term (current) use of aromatase inhibitors: Secondary | ICD-10-CM

## 2017-01-10 DIAGNOSIS — D500 Iron deficiency anemia secondary to blood loss (chronic): Secondary | ICD-10-CM | POA: Diagnosis not present

## 2017-01-10 DIAGNOSIS — M818 Other osteoporosis without current pathological fracture: Secondary | ICD-10-CM

## 2017-01-10 DIAGNOSIS — C50012 Malignant neoplasm of nipple and areola, left female breast: Secondary | ICD-10-CM

## 2017-01-10 LAB — CMP (CANCER CENTER ONLY)
ALT(SGPT): 16 U/L (ref 10–47)
AST: 17 U/L (ref 11–38)
Albumin: 3.6 g/dL (ref 3.3–5.5)
Alkaline Phosphatase: 43 U/L (ref 26–84)
BUN, Bld: 21 mg/dL (ref 7–22)
CO2: 28 mEq/L (ref 18–33)
Calcium: 9 mg/dL (ref 8.0–10.3)
Chloride: 104 mEq/L (ref 98–108)
Creat: 1.3 mg/dl — ABNORMAL HIGH (ref 0.6–1.2)
Glucose, Bld: 73 mg/dL (ref 73–118)
Potassium: 4.2 mEq/L (ref 3.3–4.7)
Sodium: 141 mEq/L (ref 128–145)
Total Bilirubin: 0.8 mg/dl (ref 0.20–1.60)
Total Protein: 6.3 g/dL — ABNORMAL LOW (ref 6.4–8.1)

## 2017-01-10 LAB — CBC WITH DIFFERENTIAL (CANCER CENTER ONLY)
BASO#: 0 10*3/uL (ref 0.0–0.2)
BASO%: 0.9 % (ref 0.0–2.0)
EOS%: 4.4 % (ref 0.0–7.0)
Eosinophils Absolute: 0.2 10*3/uL (ref 0.0–0.5)
HCT: 40.5 % (ref 34.8–46.6)
HGB: 13.6 g/dL (ref 11.6–15.9)
LYMPH#: 1.2 10*3/uL (ref 0.9–3.3)
LYMPH%: 26.7 % (ref 14.0–48.0)
MCH: 32.7 pg (ref 26.0–34.0)
MCHC: 33.6 g/dL (ref 32.0–36.0)
MCV: 97 fL (ref 81–101)
MONO#: 0.4 10*3/uL (ref 0.1–0.9)
MONO%: 9.4 % (ref 0.0–13.0)
NEUT#: 2.5 10*3/uL (ref 1.5–6.5)
NEUT%: 58.6 % (ref 39.6–80.0)
Platelets: 120 10*3/uL — ABNORMAL LOW (ref 145–400)
RBC: 4.16 10*6/uL (ref 3.70–5.32)
RDW: 13 % (ref 11.1–15.7)
WBC: 4.3 10*3/uL (ref 3.9–10.0)

## 2017-01-10 MED ORDER — DENOSUMAB 60 MG/ML ~~LOC~~ SOLN
60.0000 mg | Freq: Once | SUBCUTANEOUS | Status: AC
  Administered 2017-01-10: 60 mg via SUBCUTANEOUS
  Filled 2017-01-10: qty 1

## 2017-01-10 MED ORDER — EXEMESTANE 25 MG PO TABS: 25.0000 mg | ORAL_TABLET | Freq: Every day | ORAL | 6 refills | Status: DC

## 2017-01-10 NOTE — Progress Notes (Signed)
Hematology and Oncology Follow Up Visit  Kim [name] [id number] 09/04/1935 82 y.o. 01/10/2017   Principle Diagnosis:  Stage IIA (T1N1M0) adenocarcinoma of the left breast-triple positive Iron deficiency anemia secondary to blood loss  Current Therapy:   Status post cycle 4 of Abraxane/Herceptin Maintenance Herceptin q 3wk - finish 6/21 Prolia 60gm sq every 6 months - due in April 2018 Aromasin 25 mg by mouth daily  - start on 01/10/2017  Feraheme as needed-dose given today     Interim History:  [name] is back for followup. She had her 82th birthday back in February. She had a very nice birthday celebration.  She is worried about hair loss. She thinks that this is coming from her Femara. She's been on Femara for quite a while. I'm not sure Femara is causing this. However, we will told her to stop the Femara a month ago. She says her hair is doing better.  Because of her 4 positive lymph nodes, she really has to be on some antiestrogen therapy. I will try her on Aromasin.  She has not had any issues with fever. She's had no cough. She's had no rashes. She's had no change in bowel or bladder habits.  She does feel tired all the time. We have given her iron before. She is not anemic. I'm not sure what might be causing the fatigue.  She has a performance status of ECOG 1.  Medications:  Current Outpatient Prescriptions:  .  acetaminophen (TYLENOL) 650 MG CR tablet, Take 650 mg by mouth every 8 (eight) hours as needed for pain. , Disp: , Rfl:  .  Ascorbic Acid (VITAMIN C) 1000 MG tablet, Take 1,000 mg by mouth daily., Disp: , Rfl:  .  aspirin EC 81 MG EC tablet, Take 1 tablet (81 mg total) by mouth daily., Disp: , Rfl:  .  BIOTIN PO, Take 500 mg by mouth daily., Disp: , Rfl:  .  Calcium Citrate-Vitamin D (CITRACAL MAXIMUM PO), Take 1 tablet by mouth daily. , Disp: , Rfl:  .  cetirizine (ZYRTEC) 10 MG tablet, Take 10 mg by mouth daily as needed for allergies., Disp: , Rfl:  .   Coenzyme Q10 200 MG TABS, One tablet by mouth daily, Disp: , Rfl: 0 .  Cyanocobalamin (VITAMIN B-12) 2500 MCG SUBL, Place under the tongue as needed., Disp: , Rfl:  .  furosemide (LASIX) 20 MG tablet, Take 1 tablet (20 mg total) by mouth daily. Take 1 tablet (20 mg total) every day x 3 days then take once daily as needed (Patient taking differently: Take 20 mg by mouth daily as needed. ), Disp: 30 tablet, Rfl: 0 .  gabapentin (NEURONTIN) 100 MG capsule, Take 1 capsule (100 mg total) by mouth 3 (three) times daily., Disp: 270 capsule, Rfl: 0 .  letrozole (FEMARA) 2.5 MG tablet, Take 1 tablet (2.5 mg total) by mouth daily., Disp: 30 tablet, Rfl: 6 .  levothyroxine (SYNTHROID, LEVOTHROID) 112 MCG tablet, TAKE 1 TABLET BY MOUTH ONCE DAILY, Disp: 90 tablet, Rfl: 1 .  lidocaine-prilocaine (EMLA) cream, Apply topically as needed., Disp: 30 g, Rfl: 6 .  Loperamide HCl (IMODIUM PO), Take by mouth as needed., Disp: , Rfl:  .  metoprolol succinate (TOPROL-XL) 50 MG 24 hr tablet, TAKE 1 TABLET BY MOUTH TWICE DAILY WITH OR IMMEDIATELY FOLLOWING A MEAL, Disp: 60 tablet, Rfl: 3 .  Multiple Vitamins-Minerals (CENTRUM ADULTS PO), Take by mouth., Disp: , Rfl:  .  nitroGLYCERIN (NITROSTAT) 0.4 MG  SL tablet, Place 1 tablet (0.4 mg total) under the tongue every 5 (five) minutes x 3 doses as needed for chest pain., Disp: 25 tablet, Rfl: 3  Allergies:  Allergies  Allergen Reactions  . Propoxyphene N-Acetaminophen Swelling    tongue swelling  . Penicillins Swelling    Facial swelling  . Pravastatin Other (See Comments)    Bone and muscle pain  . Rosuvastatin Other (See Comments)    Bone and muscle pain  . Atorvastatin Other (See Comments)    Joint pain Bone and muscle pain  . Cortisone Rash and Other (See Comments)    Unsure  . Myrbetriq [Mirabegron] Other (See Comments)    Unsure Rash on face  . Penicillin G Other (See Comments)    Unsure  . Pseudoephedrine Other (See Comments)    Unsure  .  Pseudoephedrine Hcl Er Rash and Other (See Comments)    Face gets red  . Repatha [Evolocumab] Other (See Comments)    Unsure Myalgias, fatigue, some itching and burning on her feet  . Statins Other (See Comments)    Joint pain    Past Medical History, Surgical history, Social history, and Family History were reviewed and updated.  Review of Systems: As above  Physical Exam:  weight is 167 lb (75.8 kg). Her oral temperature is 99 F (37.2 C). Her blood pressure is 137/71 and her pulse is 18 (abnormal). Her oxygen saturation is 99%.   Well-developed and well-nourished white female. Her head and neck exam shows no ocular or oral lesions. She has no palpable cervical or supraclavicular lymph nodes. Lungs are clear bilaterally. Cardiac exam regular rate and rhythm with a 2/6 systolic ejection murmur. Breast exam shows right breast with no masses edema or erythema. There is no right axillary adenopathy. Left breast shows well-healed lumpectomy at the 2:00 position. There might be a little bit of erythema. The might be a little bit of swelling. There is no tenderness at the lumpectomy site. There is no masses in the left breast. There is no left axillary adenopathy.. Abdomen is soft. She's good bowel sounds. There is no palpable liver or spleen tip. Back exam shows no tenderness over the spine ribs or hips. Extremities shows no clubbing cyanosis or edema. Neurological exam shows no focal neurological deficits.  Lab Results  Component Value Date   WBC 4.3 01/10/2017   HGB 13.6 01/10/2017   HCT 40.5 01/10/2017   MCV 97 01/10/2017   PLT 120 (L) 01/10/2017     Chemistry      Component Value Date/Time   NA 141 01/10/2017 0803   NA 141 10/11/2016 0844   K 4.2 01/10/2017 0803   K 4.1 10/11/2016 0844   CL 104 01/10/2017 0803   CO2 28 01/10/2017 0803   CO2 25 10/11/2016 0844   BUN 21 01/10/2017 0803   BUN 20.3 10/11/2016 0844   CREATININE 1.3 (H) 01/10/2017 0803   CREATININE 1.1 10/11/2016  0844      Component Value Date/Time   CALCIUM 9.0 01/10/2017 0803   CALCIUM 9.5 10/11/2016 0844   ALKPHOS 43 01/10/2017 0803   ALKPHOS 52 10/11/2016 0844   AST 17 01/10/2017 0803   AST 13 10/11/2016 0844   ALT 16 01/10/2017 0803   ALT 10 10/11/2016 0844   BILITOT 0.80 01/10/2017 0803   BILITOT 0.83 10/11/2016 0844         Impression and Plan: Ms. Neal is 82 year-old white female with stage IIA ductal carcinoma of  the left breast. She had 4 positive lymph nodes. Her breast cancer was triple positive. She completed her adjuvant chemotherapy. She completed this in July of 2015.  She really needs to be on some aromatase inhibitor. I will try her on Aromasin. We will see what this does for her hair.  I will like to see her back in 6 weeks. If all looks good in 6 weeks, then we will get her appointments back on her schedule.   I spent about 25-30 minutes with her and her husband.  [name], MD 4/9/20188:48 AM

## 2017-01-10 NOTE — Progress Notes (Signed)
Second check on Prolia dose performed by this RN. dph

## 2017-01-10 NOTE — Patient Instructions (Signed)

## 2017-02-19 ENCOUNTER — Other Ambulatory Visit: Payer: Self-pay | Admitting: Cardiology

## 2017-02-22 ENCOUNTER — Other Ambulatory Visit: Payer: Self-pay | Admitting: *Deleted

## 2017-02-22 DIAGNOSIS — D0512 Intraductal carcinoma in situ of left breast: Secondary | ICD-10-CM

## 2017-02-23 ENCOUNTER — Other Ambulatory Visit (HOSPITAL_BASED_OUTPATIENT_CLINIC_OR_DEPARTMENT_OTHER): Payer: Medicare Other

## 2017-02-23 ENCOUNTER — Ambulatory Visit (HOSPITAL_BASED_OUTPATIENT_CLINIC_OR_DEPARTMENT_OTHER): Payer: Medicare Other | Admitting: Hematology & Oncology

## 2017-02-23 VITALS — BP 130/67 | HR 82 | Temp 98.2°F | Resp 16 | Wt 164.0 lb

## 2017-02-23 DIAGNOSIS — D500 Iron deficiency anemia secondary to blood loss (chronic): Secondary | ICD-10-CM | POA: Diagnosis not present

## 2017-02-23 DIAGNOSIS — C50412 Malignant neoplasm of upper-outer quadrant of left female breast: Secondary | ICD-10-CM

## 2017-02-23 DIAGNOSIS — M818 Other osteoporosis without current pathological fracture: Secondary | ICD-10-CM | POA: Diagnosis not present

## 2017-02-23 DIAGNOSIS — D0512 Intraductal carcinoma in situ of left breast: Secondary | ICD-10-CM

## 2017-02-23 DIAGNOSIS — Z170 Estrogen receptor positive status [ER+]: Secondary | ICD-10-CM

## 2017-02-23 LAB — CMP (CANCER CENTER ONLY)
ALT(SGPT): 19 U/L (ref 10–47)
AST: 17 U/L (ref 11–38)
Albumin: 3.8 g/dL (ref 3.3–5.5)
Alkaline Phosphatase: 45 U/L (ref 26–84)
BUN, Bld: 20 mg/dL (ref 7–22)
CO2: 27 meq/L (ref 18–33)
Calcium: 9.1 mg/dL (ref 8.0–10.3)
Chloride: 103 meq/L (ref 98–108)
Creat: 1.4 mg/dL — ABNORMAL HIGH (ref 0.6–1.2)
Glucose, Bld: 89 mg/dL (ref 73–118)
Potassium: 4.4 meq/L (ref 3.3–4.7)
Sodium: 139 meq/L (ref 128–145)
Total Bilirubin: 1 mg/dL (ref 0.20–1.60)
Total Protein: 6.5 g/dL (ref 6.4–8.1)

## 2017-02-23 LAB — CBC WITH DIFFERENTIAL (CANCER CENTER ONLY)
BASO#: 0 10*3/uL (ref 0.0–0.2)
BASO%: 0.6 % (ref 0.0–2.0)
EOS%: 1.7 % (ref 0.0–7.0)
Eosinophils Absolute: 0.1 10*3/uL (ref 0.0–0.5)
HCT: 40.2 % (ref 34.8–46.6)
HGB: 13.6 g/dL (ref 11.6–15.9)
LYMPH#: 0.8 10*3/uL — ABNORMAL LOW (ref 0.9–3.3)
LYMPH%: 15.8 % (ref 14.0–48.0)
MCH: 33 pg (ref 26.0–34.0)
MCHC: 33.8 g/dL (ref 32.0–36.0)
MCV: 98 fL (ref 81–101)
MONO#: 0.6 10*3/uL (ref 0.1–0.9)
MONO%: 11.1 % (ref 0.0–13.0)
NEUT#: 3.7 10*3/uL (ref 1.5–6.5)
NEUT%: 70.8 % (ref 39.6–80.0)
Platelets: 116 10*3/uL — ABNORMAL LOW (ref 145–400)
RBC: 4.12 10*6/uL (ref 3.70–5.32)
RDW: 12.9 % (ref 11.1–15.7)
WBC: 5.2 10*3/uL (ref 3.9–10.0)

## 2017-02-23 NOTE — Progress Notes (Signed)
Hematology and Oncology Follow Up Visit  Kim [name] [id number] [birth year] 82 y.o. 02/23/2017   Principle Diagnosis:  Stage IIA (T1N1M0) adenocarcinoma of the left breast-triple positive Iron deficiency anemia secondary to blood loss  Current Therapy:   Status post cycle 4 of Abraxane/Herceptin Maintenance Herceptin q 3wk - finish 6/21 Prolia 60gm sq every 6 months - due in November 2018 Aromasin 25 mg by mouth daily  - start on 01/10/2017  Feraheme as needed-dose given today     Interim History:  [name] is back for followup. She basically came back so we can see how she is doing on the Aromasin. She is tolerating Aromasin well. She is not complaining of any hair loss.  There is no arthralgias. She's had no abdominal pain. There's been no change in bowel or bladder habits.  She and her husband are going to [facility] next week. They have a campsite.  She has a performance status of ECOG 1.  Medications:  Current Outpatient Prescriptions:  .  acetaminophen (TYLENOL) 650 MG CR tablet, Take 650 mg by mouth every 8 (eight) hours as needed for pain. , Disp: , Rfl:  .  Ascorbic Acid (VITAMIN C) 1000 MG tablet, Take 1,000 mg by mouth daily., Disp: , Rfl:  .  ascorbic acid (VITAMIN C) 500 MG tablet, Take 1,000 mg by mouth., Disp: , Rfl:  .  aspirin EC 81 MG EC tablet, Take 1 tablet (81 mg total) by mouth daily., Disp: , Rfl:  .  BIOTIN PO, Take 500 mg by mouth daily., Disp: , Rfl:  .  Calcium Citrate-Vitamin D (CITRACAL MAXIMUM PO), Take 1 tablet by mouth daily. , Disp: , Rfl:  .  cetirizine (ZYRTEC) 10 MG tablet, Take 10 mg by mouth daily as needed for allergies., Disp: , Rfl:  .  Coenzyme Q10 200 MG TABS, One tablet by mouth daily, Disp: , Rfl: 0 .  Cyanocobalamin (VITAMIN B-12) 2500 MCG SUBL, Place under the tongue as needed., Disp: , Rfl:  .  exemestane (AROMASIN) 25 MG tablet, Take 1 tablet (25 mg total) by mouth daily after breakfast., Disp: 30 tablet, Rfl: 6 .   furosemide (LASIX) 20 MG tablet, Take 1 tablet (20 mg total) by mouth daily. Take 1 tablet (20 mg total) every day x 3 days then take once daily as needed (Patient taking differently: Take 20 mg by mouth daily as needed. ), Disp: 30 tablet, Rfl: 0 .  gabapentin (NEURONTIN) 100 MG capsule, Take 1 capsule (100 mg total) by mouth 3 (three) times daily., Disp: 270 capsule, Rfl: 0 .  levothyroxine (SYNTHROID, LEVOTHROID) 112 MCG tablet, TAKE 1 TABLET BY MOUTH ONCE DAILY, Disp: 90 tablet, Rfl: 1 .  lidocaine-prilocaine (EMLA) cream, Apply topically as needed., Disp: 30 g, Rfl: 6 .  Loperamide HCl (IMODIUM PO), Take by mouth as needed., Disp: , Rfl:  .  metoprolol succinate (TOPROL-XL) 50 MG 24 hr tablet, TAKE 1 TABLET BY MOUTH 2 TIMES DAILY WITH OR IMMEDIATELY FOLLOWING A MEAL, Disp: 60 tablet, Rfl: 6 .  Multiple Vitamins-Minerals (CENTRUM ADULTS PO), Take by mouth., Disp: , Rfl:  .  nitroGLYCERIN (NITROSTAT) 0.4 MG SL tablet, Place 1 tablet (0.4 mg total) under the tongue every 5 (five) minutes x 3 doses as needed for chest pain., Disp: 25 tablet, Rfl: 3  Allergies:  Allergies  Allergen Reactions  . Propoxyphene N-Acetaminophen Swelling    tongue swelling  . Penicillins Swelling    Facial swelling  . Pravastatin  Other (See Comments)    Bone and muscle pain  . Rosuvastatin Other (See Comments)    Bone and muscle pain  . Atorvastatin Other (See Comments)    Joint pain Bone and muscle pain  . Cortisone Rash and Other (See Comments)    Unsure  . Myrbetriq [Mirabegron] Other (See Comments)    Unsure Rash on face  . Penicillin G Other (See Comments)    Unsure  . Pseudoephedrine Other (See Comments)    Unsure  . Pseudoephedrine Hcl Er Rash and Other (See Comments)    Face gets red  . Repatha [Evolocumab] Other (See Comments)    Unsure Myalgias, fatigue, some itching and burning on her feet  . Statins Other (See Comments)    Joint pain    Past Medical History, Surgical history, Social  history, and Family History were reviewed and updated.  Review of Systems: As above  Physical Exam:  weight is 164 lb (74.4 kg). Her oral temperature is 98.2 F (36.8 C). Her blood pressure is 130/67 and her pulse is 82. Her respiration is 16 and oxygen saturation is 100%.   Well-developed and well-nourished white female. Her head and neck exam shows no ocular or oral lesions. She has no palpable cervical or supraclavicular lymph nodes. Lungs are clear bilaterally. Cardiac exam regular rate and rhythm with a 2/6 systolic ejection murmur. Breast exam shows right breast with no masses edema or erythema. There is no right axillary adenopathy. Left breast shows well-healed lumpectomy at the 2:00 position. There might be a little bit of erythema. The might be a little bit of swelling. There is no tenderness at the lumpectomy site. There is no masses in the left breast. There is no left axillary adenopathy.. Abdomen is soft. She's good bowel sounds. There is no palpable liver or spleen tip. Back exam shows no tenderness over the spine ribs or hips. Extremities shows no clubbing cyanosis or edema. Neurological exam shows no focal neurological deficits.  Lab Results  Component Value Date   WBC 5.2 02/23/2017   HGB 13.6 02/23/2017   HCT 40.2 02/23/2017   MCV 98 02/23/2017   PLT 116 (L) 02/23/2017     Chemistry      Component Value Date/Time   NA 139 02/23/2017 1145   NA 141 10/11/2016 0844   K 4.4 02/23/2017 1145   K 4.1 10/11/2016 0844   CL 103 02/23/2017 1145   CO2 27 02/23/2017 1145   CO2 25 10/11/2016 0844   BUN 20 02/23/2017 1145   BUN 20.3 10/11/2016 0844   CREATININE 1.4 (H) 02/23/2017 1145   CREATININE 1.1 10/11/2016 0844      Component Value Date/Time   CALCIUM 9.1 02/23/2017 1145   CALCIUM 9.5 10/11/2016 0844   ALKPHOS 45 02/23/2017 1145   ALKPHOS 52 10/11/2016 0844   AST 17 02/23/2017 1145   AST 13 10/11/2016 0844   ALT 19 02/23/2017 1145   ALT 10 10/11/2016 0844    BILITOT 1.00 02/23/2017 1145   BILITOT 0.83 10/11/2016 0844         Impression and Plan: Kim [name] is 82 year-old white female with stage IIA ductal carcinoma of the left breast. She had 4 positive lymph nodes. Her breast cancer was triple positive. She completed her adjuvant chemotherapy. She completed this in July of 2015.  I'm glad that the Aromasin is working well for her. As such, we will keep her on the Aromasin.  I will plan to get her  back in September.  We will get her through the summer.  I think this would be reasonable.   Marland Kitchen   [name], MD 5/23/20181:04 PM

## 2017-02-24 LAB — IRON AND TIBC
%SAT: 30 % (ref 21–57)
Iron: 93 ug/dL (ref 41–142)
TIBC: 311 ug/dL (ref 236–444)
UIBC: 219 ug/dL (ref 120–384)

## 2017-02-24 LAB — FERRITIN: Ferritin: 63 ng/ml (ref 9–269)

## 2017-04-18 ENCOUNTER — Other Ambulatory Visit: Payer: Self-pay | Admitting: Family Medicine

## 2017-05-05 ENCOUNTER — Ambulatory Visit (HOSPITAL_COMMUNITY)
Admission: RE | Admit: 2017-05-05 | Discharge: 2017-05-05 | Disposition: A | Discharge status: Home / Self Care | Payer: Medicare Other | Source: Ambulatory Visit | Attending: Cardiology | Admitting: Cardiology

## 2017-05-05 ENCOUNTER — Encounter (HOSPITAL_COMMUNITY): Payer: Self-pay | Admitting: Cardiology

## 2017-05-05 VITALS — BP 134/80 | HR 67 | Wt 162.4 lb

## 2017-05-05 DIAGNOSIS — E785 Hyperlipidemia, unspecified: Secondary | ICD-10-CM | POA: Insufficient documentation

## 2017-05-05 DIAGNOSIS — I421 Obstructive hypertrophic cardiomyopathy: Secondary | ICD-10-CM | POA: Insufficient documentation

## 2017-05-05 DIAGNOSIS — I2510 Atherosclerotic heart disease of native coronary artery without angina pectoris: Secondary | ICD-10-CM

## 2017-05-05 DIAGNOSIS — I25110 Atherosclerotic heart disease of native coronary artery with unstable angina pectoris: Secondary | ICD-10-CM | POA: Insufficient documentation

## 2017-05-05 DIAGNOSIS — Z79899 Other long term (current) drug therapy: Secondary | ICD-10-CM | POA: Diagnosis not present

## 2017-05-05 DIAGNOSIS — I10 Essential (primary) hypertension: Secondary | ICD-10-CM | POA: Diagnosis not present

## 2017-05-05 DIAGNOSIS — Z853 Personal history of malignant neoplasm of breast: Secondary | ICD-10-CM | POA: Diagnosis not present

## 2017-05-05 DIAGNOSIS — E782 Mixed hyperlipidemia: Secondary | ICD-10-CM | POA: Diagnosis not present

## 2017-05-05 DIAGNOSIS — M791 Myalgia: Secondary | ICD-10-CM | POA: Insufficient documentation

## 2017-05-05 DIAGNOSIS — E039 Hypothyroidism, unspecified: Secondary | ICD-10-CM | POA: Insufficient documentation

## 2017-05-05 DIAGNOSIS — Z7982 Long term (current) use of aspirin: Secondary | ICD-10-CM | POA: Insufficient documentation

## 2017-05-05 DIAGNOSIS — Z9221 Personal history of antineoplastic chemotherapy: Secondary | ICD-10-CM | POA: Diagnosis not present

## 2017-05-05 MED ORDER — EZETIMIBE 10 MG PO TABS: 10.0000 mg | ORAL_TABLET | Freq: Every day | ORAL | 6 refills | Status: DC

## 2017-05-05 NOTE — Patient Instructions (Signed)
START Ezetimibe (Zetia) 10 mg tablet once daily.  Return for labs in 2 months.  Follow up with Dr. Aundra Dubin in 6 months. Take all medication as prescribed the day of your appointment. Bring all medications with you to your appointment.  Do the following things EVERYDAY: 1) Weigh yourself in the morning before breakfast. Write it down and keep it in a log. 2) Take your medicines as prescribed 3) Eat low salt foods-Limit salt (sodium) to 2000 mg per day.  4) Stay as active as you can everyday 5) Limit all fluids for the day to less than 2 liters

## 2017-05-07 NOTE — Progress Notes (Signed)
Patient ID: Kim [name], female   DOB: [birth year], 82 y.o.   MRN: [id number] PCP: Dr. Sarajane Jews Oncologist: Dr. Marin Olp Cardiology: Dr. Aundra Dubin  82 yo with history of CAD and HOCM presents for cardiology followup  Patient had breast cancer in 2015 and received treatment involving Herceptin.  During breast cancer treatment, she developed unstable angina and ended up getting a DES to the mid RCA in 3/15.  Patient additionally has a history of HOCM.  This has been recognized on prior echoes.  She has severe asymmetric basal septal hypertrophy and SAM with LVOT gradient peak 58 mmHg on echo in 3/15 along with moderate MR.  Repeat echo in 7/15 showed LVOT gradient down to 30 mmHg on higher beta blocker.  Repeat echo in 11/15 showed no significant LVOT gradient but SAM still present.  Echo (8/16) showed asymmetric septal hypertrophy, No SAM, no significant LVOT gradient.  Echo 4/18 showed EF 55-60%, small LVOT gradient with severe asymmetric septal hypertrophy, mild MR, PASP 32 mmHg.    She returns for followup today.  She has not been able to tolerate any statin due to myalgias.  She tried Repatha but had myalgias with this also.  No chest pain.  No exertional dyspnea, limited only by knee pain.  No orthopnea/PND. No lightheadedness.  She has not used any Lasix recently.    Labs (3/15): K 4.5, creatinine 0.84, LDL 91, HDL 46 Labs (5/15): K 3.9, creatinine 1.1 Labs (12/15): LDL 80, HDL 28, hemoglobin 10.9 Labs (1/16): K 3.7, creatinine 0.8 Labs (2/16): K 3.6, creatinine 1.2, HCT 34.4 Labs (6/16): K 4.1, creatinine 1.24 Labs (7/17): K 4.1, creatinine 1.2, HCT 38.8, LDL 143 Labs (1/18): LDL 142 Labs (5/18): K 4.4, creatinine 1.0  ECG (8/18, personally reviewed): NSR, 1st degree AV block, lateral TWIs  PMH: 1. CAD: Unstable angina 3/15 with LHC showing 99% mRCA stenosis, treated with DES to mRCA.  2. Hypothyroidism 3. Diverticulosis 4. HTN 5. H/o TAH/BSO 6. Breast cancer: s/p lumpectomy with lymph node  biopsy in 2/15.  4/10 nodes positive.  She was started on docetaxol/carboplatin/Herceptin in 3/15 with plan for 6 cycles chemo.  7. Hypertrophic obstructive cardiomyopathy: Echo (3/15) with severe focal basal septal hypertrophy (22 mm), narrow LV outflow tract with mitral valve SAM and 58 mmHg peak LVOT resting gradient, EF 60-65%, moderate MR, moderate LAE, normal RV, lateral s' 10.4 cm/sec.  Patient says that her 2 grown sons has had echoes to screen for HOCM.  Echo (4/15) with EF 65-70%, severe focal basal septal hypertrophy, LVOT gradient 33 mmHg, SAM was present with moderate MR, normal RV size and systolic function, lateral S' 10.6, GLS -17.5%.  Cardiac MRI (5/15) with EF 65%, moderate asymmetric septal hypertrophy, systolic anterior motion of the mitral valve with moderate MR, there was no delayed enhancement.  Echo (7/15) with EF 65-70%, moderate ASH, peak LVOT gradient 30 mmHg, SAM with moderate MR, moderate to severe LAE.  Echo (11/15) with EF 55-60%, GLS -15%, no significant LVOT gradient, systolic anterior motion of the mitral valve with mild MR, normal RV size and systolic function. Echo (4/14) with EF 55-60%, severe asymmetric septal hypertrophy, LVOT gradient 20 mmHg, mild-moderate MR, GLS -18%.  - Echo (8/16): EF 60-65%, asymmetric septal hypertrophy, no significant LV outflow tract gradient, mild MR.   - Echo (4/18): EF 55-60%, small LVOT gradient with severe asymmetric septal hypertrophy, mild MR, PASP 32 mmHg.   8. Hyperlipidemia: Myalgias with atorvastatin, myalgias with Repatha.   SH: Married, 2  children, retired, nonsmoker.   FH: No family history of HOCM or sudden death.  There is a family history of CAD.   ROS: All systems reviewed and negative except as per HPI.    Current Outpatient Prescriptions  Medication Sig Dispense Refill  . acetaminophen (TYLENOL) 650 MG CR tablet Take 650 mg by mouth every 8 (eight) hours as needed for pain.     . Ascorbic Acid (VITAMIN C) 1000 MG  tablet Take 1,000 mg by mouth daily.    Marland Kitchen ascorbic acid (VITAMIN C) 500 MG tablet Take 1,000 mg by mouth.    Marland Kitchen aspirin EC 81 MG EC tablet Take 1 tablet (81 mg total) by mouth daily.    Marland Kitchen BIOTIN PO Take 500 mg by mouth daily.    . Calcium Citrate-Vitamin D (CITRACAL MAXIMUM PO) Take 1 tablet by mouth daily.     . cetirizine (ZYRTEC) 10 MG tablet Take 10 mg by mouth daily as needed for allergies.    . Coenzyme Q10 200 MG TABS One tablet by mouth daily  0  . Cyanocobalamin (VITAMIN B-12) 2500 MCG SUBL Place under the tongue as needed.    Marland Kitchen exemestane (AROMASIN) 25 MG tablet Take 1 tablet (25 mg total) by mouth daily after breakfast. 30 tablet 6  . furosemide (LASIX) 20 MG tablet Take 1 tablet (20 mg total) by mouth daily. Take 1 tablet (20 mg total) every day x 3 days then take once daily as needed (Patient taking differently: Take 20 mg by mouth daily as needed. ) 30 tablet 0  . gabapentin (NEURONTIN) 100 MG capsule Take 1 capsule (100 mg total) by mouth 3 (three) times daily. 270 capsule 0  . levothyroxine (SYNTHROID, LEVOTHROID) 112 MCG tablet TAKE 1 TABLET BY MOUTH ONCE DAILY 90 tablet 1  . lidocaine-prilocaine (EMLA) cream Apply topically as needed. 30 g 6  . Loperamide HCl (IMODIUM PO) Take by mouth as needed.    . metoprolol succinate (TOPROL-XL) 50 MG 24 hr tablet TAKE 1 TABLET BY MOUTH 2 TIMES DAILY WITH OR IMMEDIATELY FOLLOWING A MEAL 60 tablet 6  . Multiple Vitamins-Minerals (CENTRUM ADULTS PO) Take by mouth.    . nitroGLYCERIN (NITROSTAT) 0.4 MG SL tablet Place 1 tablet (0.4 mg total) under the tongue every 5 (five) minutes x 3 doses as needed for chest pain. 25 tablet 3  . ezetimibe (ZETIA) 10 MG tablet Take 1 tablet (10 mg total) by mouth daily. 30 tablet 6   No current facility-administered medications for this encounter.    BP 134/80 (BP Location: Left Arm, Patient Position: Sitting, Cuff Size: Normal)   Pulse 67   Wt 162 lb 6.4 oz (73.7 kg)   LMP 10/19/1991   SpO2 97%   BMI  24.69 kg/m  General: NAD Neck: No JVD, no thyromegaly or thyroid nodule.  Lungs: Clear to auscultation bilaterally with normal respiratory effort. CV: Nondisplaced PMI.  Heart regular S1/S2, no S3/S4, 2/6 SEM RUSB.  No peripheral edema.  No carotid bruit.  Normal pedal pulses.  Abdomen: Soft, nontender, no hepatosplenomegaly, no distention.  Skin: Intact without lesions or rashes.  Neurologic: Alert and oriented x 3.  Psych: Normal affect. Extremities: No clubbing or cyanosis.  HEENT: Normal.   Assessment/Plan: 1. CAD: Status post PCI for unstable angina in 3/15 with DES to [facility]. She is now off Plavix.  - Continue ASA 81 and Toprol XL.     - Unable to tolerate statins or Repatha.  2. Hyperlipidemia: Myalgias with  Crestor, Lipitor, and pravastatin.  Myalgias with Repatha.  I will try her on Zetia 10 mg daily.  Lipids in 2 months if she can tolerate it.  3. Hypertrophic obstructive cardiomyopathy: No family history of HOCM or sudden death (interestingly, her husband has HOCM).  Most recent echo in 4/18 showed a small LVOT gradient and severe asymmetric septal hypertrophy.  Cardiac MRI in 5/15 showed no delayed enhancement.  - Continue current Toprol XL.  - Per patient, her 2 sons have had screening echoes to look for hypertrophic cardiomyopathy and did not show signs of HOCM.    Followup in 6 months.    [name] 05/07/2017

## 2017-06-09 ENCOUNTER — Ambulatory Visit (HOSPITAL_BASED_OUTPATIENT_CLINIC_OR_DEPARTMENT_OTHER): Payer: Medicare Other | Admitting: Hematology & Oncology

## 2017-06-09 ENCOUNTER — Other Ambulatory Visit (HOSPITAL_BASED_OUTPATIENT_CLINIC_OR_DEPARTMENT_OTHER): Payer: Medicare Other

## 2017-06-09 VITALS — BP 147/81 | HR 60 | Temp 98.0°F | Wt 164.0 lb

## 2017-06-09 DIAGNOSIS — C50412 Malignant neoplasm of upper-outer quadrant of left female breast: Secondary | ICD-10-CM

## 2017-06-09 DIAGNOSIS — C779 Secondary and unspecified malignant neoplasm of lymph node, unspecified: Secondary | ICD-10-CM

## 2017-06-09 DIAGNOSIS — Z170 Estrogen receptor positive status [ER+]: Principal | ICD-10-CM

## 2017-06-09 DIAGNOSIS — D500 Iron deficiency anemia secondary to blood loss (chronic): Secondary | ICD-10-CM

## 2017-06-09 DIAGNOSIS — M818 Other osteoporosis without current pathological fracture: Secondary | ICD-10-CM

## 2017-06-09 LAB — CBC WITH DIFFERENTIAL (CANCER CENTER ONLY)
BASO#: 0 10*3/uL (ref 0.0–0.2)
BASO%: 0.5 % (ref 0.0–2.0)
EOS%: 2.6 % (ref 0.0–7.0)
Eosinophils Absolute: 0.1 10*3/uL (ref 0.0–0.5)
HCT: 41.3 % (ref 34.8–46.6)
HGB: 14 g/dL (ref 11.6–15.9)
LYMPH#: 0.8 10*3/uL — ABNORMAL LOW (ref 0.9–3.3)
LYMPH%: 19.2 % (ref 14.0–48.0)
MCH: 32.6 pg (ref 26.0–34.0)
MCHC: 33.9 g/dL (ref 32.0–36.0)
MCV: 96 fL (ref 81–101)
MONO#: 0.4 10*3/uL (ref 0.1–0.9)
MONO%: 10.1 % (ref 0.0–13.0)
NEUT#: 2.8 10*3/uL (ref 1.5–6.5)
NEUT%: 67.6 % (ref 39.6–80.0)
Platelets: 113 10*3/uL — ABNORMAL LOW (ref 145–400)
RBC: 4.29 10*6/uL (ref 3.70–5.32)
RDW: 12.8 % (ref 11.1–15.7)
WBC: 4.2 10*3/uL (ref 3.9–10.0)

## 2017-06-09 LAB — CMP (CANCER CENTER ONLY)
ALT(SGPT): 19 U/L (ref 10–47)
AST: 19 U/L (ref 11–38)
Albumin: 3.6 g/dL (ref 3.3–5.5)
Alkaline Phosphatase: 49 U/L (ref 26–84)
BUN, Bld: 17 mg/dL (ref 7–22)
CO2: 28 mEq/L (ref 18–33)
Calcium: 9.1 mg/dL (ref 8.0–10.3)
Chloride: 106 mEq/L (ref 98–108)
Creat: 1.2 mg/dl (ref 0.6–1.2)
Glucose, Bld: 90 mg/dL (ref 73–118)
Potassium: 4.5 mEq/L (ref 3.3–4.7)
Sodium: 140 mEq/L (ref 128–145)
Total Bilirubin: 0.9 mg/dl (ref 0.20–1.60)
Total Protein: 6.6 g/dL (ref 6.4–8.1)

## 2017-06-09 LAB — LACTATE DEHYDROGENASE: LDH: 211 U/L (ref 125–245)

## 2017-06-09 NOTE — Progress Notes (Signed)
Hematology and Oncology Follow Up Visit  Kim [name] [id number] 1935/08/01 82 y.o. 06/09/2017   Principle Diagnosis:  Stage IIA (T1N1M0) adenocarcinoma of the left breast-triple positive Iron deficiency anemia secondary to blood loss  Current Therapy:   Status post cycle 4 of Abraxane/Herceptin Maintenance Herceptin q 3wk - finish 6/21 Prolia 60gm sq every 6 months - due in November 2018 Aromasin 25 mg by mouth daily  - start on 01/10/2017  Feraheme as needed-dose given today     Interim History:  [name] is back for followup. She is doing quite well. She's had no problems since we last saw her. We last saw her back in May. She's had a pretty good summer. As always, she and her family go to the beach.  She's had no heart issues. She's had no problems with cough or shortness of breath. She's had no bony pain. She's had no change in bowel or bladder habits.  She's had no fever. There's been no issues with infections.  Overall, her performance status is ECOG 1.   Medications:  Current Outpatient Prescriptions:  .  acetaminophen (TYLENOL) 650 MG CR tablet, Take 650 mg by mouth every 8 (eight) hours as needed for pain. , Disp: , Rfl:  .  Ascorbic Acid (VITAMIN C) 1000 MG tablet, Take 1,000 mg by mouth daily., Disp: , Rfl:  .  ascorbic acid (VITAMIN C) 500 MG tablet, Take 1,000 mg by mouth., Disp: , Rfl:  .  aspirin EC 81 MG EC tablet, Take 1 tablet (81 mg total) by mouth daily., Disp: , Rfl:  .  BIOTIN PO, Take 500 mg by mouth daily., Disp: , Rfl:  .  Calcium Citrate-Vitamin D (CITRACAL MAXIMUM PO), Take 1 tablet by mouth daily. , Disp: , Rfl:  .  cetirizine (ZYRTEC) 10 MG tablet, Take 10 mg by mouth daily as needed for allergies., Disp: , Rfl:  .  Coenzyme Q10 200 MG TABS, One tablet by mouth daily, Disp: , Rfl: 0 .  Cyanocobalamin (VITAMIN B-12) 2500 MCG SUBL, Place under the tongue as needed., Disp: , Rfl:  .  exemestane (AROMASIN) 25 MG tablet, Take 1 tablet (25 mg total) by  mouth daily after breakfast., Disp: 30 tablet, Rfl: 6 .  ezetimibe (ZETIA) 10 MG tablet, Take 1 tablet (10 mg total) by mouth daily., Disp: 30 tablet, Rfl: 6 .  furosemide (LASIX) 20 MG tablet, Take 1 tablet (20 mg total) by mouth daily. Take 1 tablet (20 mg total) every day x 3 days then take once daily as needed (Patient taking differently: Take 20 mg by mouth daily as needed. ), Disp: 30 tablet, Rfl: 0 .  gabapentin (NEURONTIN) 100 MG capsule, Take 1 capsule (100 mg total) by mouth 3 (three) times daily., Disp: 270 capsule, Rfl: 0 .  levothyroxine (SYNTHROID, LEVOTHROID) 112 MCG tablet, TAKE 1 TABLET BY MOUTH ONCE DAILY, Disp: 90 tablet, Rfl: 1 .  lidocaine-prilocaine (EMLA) cream, Apply topically as needed., Disp: 30 g, Rfl: 6 .  Loperamide HCl (IMODIUM PO), Take by mouth as needed., Disp: , Rfl:  .  metoprolol succinate (TOPROL-XL) 50 MG 24 hr tablet, TAKE 1 TABLET BY MOUTH 2 TIMES DAILY WITH OR IMMEDIATELY FOLLOWING A MEAL, Disp: 60 tablet, Rfl: 6 .  Multiple Vitamins-Minerals (CENTRUM ADULTS PO), Take by mouth., Disp: , Rfl:  .  nitroGLYCERIN (NITROSTAT) 0.4 MG SL tablet, Place 1 tablet (0.4 mg total) under the tongue every 5 (five) minutes x 3 doses as needed for  chest pain., Disp: 25 tablet, Rfl: 3  Allergies:  Allergies  Allergen Reactions  . Propoxyphene N-Acetaminophen Swelling    tongue swelling  . Penicillins Swelling    Facial swelling  . Pravastatin Other (See Comments)    Bone and muscle pain  . Rosuvastatin Other (See Comments)    Bone and muscle pain  . Atorvastatin Other (See Comments)    Joint pain Bone and muscle pain  . Cortisone Rash and Other (See Comments)    Unsure  . Myrbetriq [Mirabegron] Other (See Comments)    Unsure Rash on face  . Penicillin G Other (See Comments)    Unsure  . Pseudoephedrine Other (See Comments)    Unsure  . Pseudoephedrine Hcl Er Rash and Other (See Comments)    Face gets red  . Repatha [Evolocumab] Other (See Comments)     Unsure Myalgias, fatigue, some itching and burning on her feet  . Statins Other (See Comments)    Joint pain    Past Medical History, Surgical history, Social history, and Family History were reviewed and updated.  Review of Systems: As stated in the interim history  Physical Exam:  weight is 164 lb (74.4 kg). Her oral temperature is 98 F (36.7 C). Her blood pressure is 147/81 (abnormal) and her pulse is 60.   Well-developed and well-nourished white female. Her head and neck exam shows no ocular or oral lesions. She has no palpable cervical or supraclavicular lymph nodes. Lungs are clear bilaterally. Cardiac exam regular rate and rhythm with a 2/6 systolic ejection murmur. Breast exam shows right breast with no masses edema or erythema. There is no right axillary adenopathy. Left breast shows well-healed lumpectomy at the 2:00 position. There might be a little bit of erythema. The might be a little bit of swelling. There is no tenderness at the lumpectomy site. There is no masses in the left breast. There is no left axillary adenopathy.. Abdomen is soft. She's good bowel sounds. There is no palpable liver or spleen tip. Back exam shows no tenderness over the spine ribs or hips. Extremities shows no clubbing cyanosis or edema. Neurological exam shows no focal neurological deficits.  Lab Results  Component Value Date   WBC 4.2 06/09/2017   HGB 14.0 06/09/2017   HCT 41.3 06/09/2017   MCV 96 06/09/2017   PLT 113 (L) 06/09/2017     Chemistry      Component Value Date/Time   NA 140 06/09/2017 0758   NA 141 10/11/2016 0844   K 4.5 06/09/2017 0758   K 4.1 10/11/2016 0844   CL 106 06/09/2017 0758   CO2 28 06/09/2017 0758   CO2 25 10/11/2016 0844   BUN 17 06/09/2017 0758   BUN 20.3 10/11/2016 0844   CREATININE 1.2 06/09/2017 0758   CREATININE 1.1 10/11/2016 0844      Component Value Date/Time   CALCIUM 9.1 06/09/2017 0758   CALCIUM 9.5 10/11/2016 0844   ALKPHOS 49 06/09/2017 0758    ALKPHOS 52 10/11/2016 0844   AST 19 06/09/2017 0758   AST 13 10/11/2016 0844   ALT 19 06/09/2017 0758   ALT 10 10/11/2016 0844   BILITOT 0.90 06/09/2017 0758   BILITOT 0.83 10/11/2016 0844         Impression and Plan: Kim [name] is 82 year-old white female with stage IIA ductal carcinoma of the left breast. She had 4 positive lymph nodes. Her breast cancer was triple positive. She completed her adjuvant chemotherapy. She completed this  in July of 2015.  It looks like she is doing quite well with the Aromasin. We will continue her on the Aromasin.  Given that she had 4 positive lymph nodes, I would keep her on the Aromasin for 10 years. I think this would be an official for her.  I think we can see her back in 6 months. This, to me, would be reasonable.  She knows that she can always come back sooner if she has any problems. [name], MD 9/6/20188:51 AM

## 2017-06-10 LAB — VITAMIN D 25 HYDROXY (VIT D DEFICIENCY, FRACTURES): Vitamin D, 25-Hydroxy: 39.2 ng/mL (ref 30.0–100.0)

## 2017-06-17 ENCOUNTER — Ambulatory Visit (INDEPENDENT_AMBULATORY_CARE_PROVIDER_SITE_OTHER): Payer: Medicare Other | Admitting: Family Medicine

## 2017-06-17 ENCOUNTER — Encounter: Payer: Self-pay | Admitting: Family Medicine

## 2017-06-17 VITALS — BP 137/87 | HR 59 | Temp 98.7°F | Ht 68.0 in | Wt 167.0 lb

## 2017-06-17 DIAGNOSIS — I10 Essential (primary) hypertension: Secondary | ICD-10-CM

## 2017-06-17 DIAGNOSIS — M25561 Pain in right knee: Secondary | ICD-10-CM

## 2017-06-17 DIAGNOSIS — I421 Obstructive hypertrophic cardiomyopathy: Secondary | ICD-10-CM

## 2017-06-17 DIAGNOSIS — I2510 Atherosclerotic heart disease of native coronary artery without angina pectoris: Secondary | ICD-10-CM

## 2017-06-17 DIAGNOSIS — E039 Hypothyroidism, unspecified: Secondary | ICD-10-CM | POA: Diagnosis not present

## 2017-06-17 DIAGNOSIS — G8929 Other chronic pain: Secondary | ICD-10-CM

## 2017-06-17 LAB — T4, FREE: Free T4: 1.5 ng/dL (ref 0.8–1.8)

## 2017-06-17 LAB — TSH: TSH: 1.47 mIU/L (ref 0.40–4.50)

## 2017-06-17 LAB — T3, FREE: T3, Free: 2.4 pg/mL (ref 2.3–4.2)

## 2017-06-17 NOTE — Patient Instructions (Signed)
WE NOW OFFER   [facility] FAST TRACK!!!  SAME DAY Appointments for ACUTE CARE  Such as: Sprains, Injuries, cuts, abrasions, rashes, muscle pain, joint pain, back pain Colds, flu, sore throats, headache, allergies, cough, fever  Ear pain, sinus and eye infections Abdominal pain, nausea, vomiting, diarrhea, upset stomach Animal/insect bites  3 Easy Ways to Schedule: Walk-In Scheduling Call in scheduling Mychart Sign-up: [email or web address]         

## 2017-06-17 NOTE — Progress Notes (Signed)
   Subjective:    Patient ID: Kim [name], female    DOB: 29-Oct-1934, 82 y.o.   MRN: [id number]  HPI Here for a preoperative clearance exam prior to likely having right knee surgery per Dr. Wynelle Link in the near future. It seems that there is still some debate about whether she needs an arthroscopy or a total replacement. Other than the knee pain she feels well. She saw Dr. Marin Olp on 06-09-17 to follow up her breast cancer, and he was pleased with her progress. He will see her again in 6 months. She saw Dr. Aundra Dubin on 05-07-17 for cardiac follow up, and he was pleased. She will follow up with him in 6 months as well. An ECHO then showed stable septal hypertrophy and mild mitral regurgitation, with an EF of 55-60%. She has had complete lab work in the past 2 months for all the basic things except for her thyroid level.    Review of Systems  Constitutional: Negative.   HENT: Negative.   Eyes: Negative.   Respiratory: Negative.   Cardiovascular: Negative.   Gastrointestinal: Negative.   Genitourinary: Negative.   Musculoskeletal: Positive for arthralgias.  Neurological: Negative.   Hematological: Negative.        Objective:   Physical Exam  Constitutional: She is oriented to person, place, and time. She appears well-developed and well-nourished.  HENT:  Right Ear: External ear normal.  Left Ear: External ear normal.  Nose: Nose normal.  Mouth/Throat: Oropharynx is clear and moist.  Eyes: Pupils are equal, round, and reactive to light. Conjunctivae and EOM are normal.  Neck: Neck supple. No thyromegaly present.  Cardiovascular: Normal rate, regular rhythm and intact distal pulses.   2/6 SM over the right sternal margin   Pulmonary/Chest: Effort normal and breath sounds normal. No respiratory distress. She has no wheezes. She has no rales.  Abdominal: Soft. Bowel sounds are normal. She exhibits no distension and no mass. There is no tenderness. There is no rebound and no guarding.    Musculoskeletal: She exhibits no edema.  Lymphadenopathy:    She has no cervical adenopathy.  Neurological: She is alert and oriented to person, place, and time.          Assessment & Plan:  Preoperative evaluation prior to knee surgery. Her HTN is stable. Her heart disease is stable. We will get a TSH today to check her thyroid status. She seems to be doing well and her surgical risk is felt to be quite low.  Alysia Penna, MD

## 2017-06-23 ENCOUNTER — Encounter: Payer: Self-pay | Admitting: Family Medicine

## 2017-07-01 ENCOUNTER — Telehealth (HOSPITAL_COMMUNITY): Payer: Self-pay | Admitting: *Deleted

## 2017-07-01 NOTE — Telephone Encounter (Signed)
Received surgical clearance  For patient to have Right: TKA-medial & lateral w/wo patella resurfacing procedure on Aug 15, 2017.  Patient is cleared for surgery but Dr. Aundra Dubin notes that its very important for patient to continue beta blocker prior to procedure.    Clearance faxed today to [facility] @ [phone].

## 2017-07-05 ENCOUNTER — Ambulatory Visit (HOSPITAL_COMMUNITY)
Admission: RE | Admit: 2017-07-05 | Discharge: 2017-07-05 | Disposition: A | Discharge status: Home / Self Care | Payer: Medicare Other | Source: Ambulatory Visit | Attending: Cardiology | Admitting: Cardiology

## 2017-07-05 DIAGNOSIS — E782 Mixed hyperlipidemia: Secondary | ICD-10-CM | POA: Insufficient documentation

## 2017-07-05 LAB — LIPID PANEL
Cholesterol: 192 mg/dL (ref 0–200)
HDL: 37 mg/dL — ABNORMAL LOW (ref >40)
LDL Cholesterol: 131 mg/dL — ABNORMAL HIGH (ref 0–99)
Total CHOL/HDL Ratio: 5.2 RATIO
Triglycerides: 122 mg/dL (ref <150)
VLDL: 24 mg/dL (ref 0–40)

## 2017-07-08 ENCOUNTER — Encounter (HOSPITAL_COMMUNITY): Payer: Self-pay

## 2017-07-26 ENCOUNTER — Ambulatory Visit: Payer: Self-pay | Admitting: Orthopedic Surgery

## 2017-08-08 NOTE — Progress Notes (Signed)
06-17-17 Surgical clearance from Dr. Sarajane Jews on Chart  05-05-17 [facility]) EKG and Cardiology note   01-03-17 (EPIC) ECHO

## 2017-08-08 NOTE — Patient Instructions (Addendum)
Kim [name]  08/08/2017   Your procedure is scheduled on: 08-15-17  Report to [facility] Main  Entrance Report to Admitting at 10:10 AM   Call this number if you have problems the morning of surgery [phone]   Remember: ONLY 1 PERSON MAY GO WITH YOU TO SHORT STAY TO GET  READY MORNING OF YOUR SURGERY.  Do not eat food or drink liquids :After Midnight.     Take these medicines the morning of surgery with A SIP OF WATER: Levothyroxine (Synthroid), and Metoprolol Succinate (Toprol)                                You may not have any metal on your body including hair pins and              piercings  Do not wear jewelry, make-up, lotions, powders or perfumes, deodorant             Do not wear nail polish.  Do not shave  48 hours prior to surgery.                Do not bring valuables to the hospital. [place].  Contacts, dentures or bridgework may not be worn into surgery.  Leave suitcase in the car. After surgery it may be brought to your room.                   Please read over the following fact sheets you were given: _____________________________________________________________________             [facility] - Preparing for Surgery Before surgery, you can play an important role.  Because skin is not sterile, your skin needs to be as free of germs as possible.  You can reduce the number of germs on your skin by washing with CHG (chlorahexidine gluconate) soap before surgery.  CHG is an antiseptic cleaner which kills germs and bonds with the skin to continue killing germs even after washing. Please DO NOT use if you have an allergy to CHG or antibacterial soaps.  If your skin becomes reddened/irritated stop using the CHG and inform your nurse when you arrive at Short Stay. Do not shave (including legs and underarms) for at least 48 hours prior to the first CHG shower.  You may shave your  face/neck. Please follow these instructions carefully:  1.  Shower with CHG Soap the night before surgery and the  morning of Surgery.  2.  If you choose to wash your hair, wash your hair first as usual with your  normal  shampoo.  3.  After you shampoo, rinse your hair and body thoroughly to remove the  shampoo.                           4.  Use CHG as you would any other liquid soap.  You can apply chg directly  to the skin and wash                       Gently with a scrungie or clean washcloth.  5.  Apply the CHG Soap to your body ONLY FROM THE NECK  DOWN.   Do not use on face/ open                           Wound or open sores. Avoid contact with eyes, ears mouth and genitals (private parts).                       Wash face,  Genitals (private parts) with your normal soap.             6.  Wash thoroughly, paying special attention to the area where your surgery  will be performed.  7.  Thoroughly rinse your body with warm water from the neck down.  8.  DO NOT shower/wash with your normal soap after using and rinsing off  the CHG Soap.                9.  Pat yourself dry with a clean towel.            10.  Wear clean pajamas.            11.  Place clean sheets on your bed the night of your first shower and do not  sleep with pets. Day of Surgery : Do not apply any lotions/deodorants the morning of surgery.  Please wear clean clothes to the hospital/surgery center.  FAILURE TO FOLLOW THESE INSTRUCTIONS MAY RESULT IN THE CANCELLATION OF YOUR SURGERY PATIENT SIGNATURE_________________________________  NURSE SIGNATURE__________________________________  ________________________________________________________________________   Kim [name]  An incentive spirometer is a tool that can help keep your lungs clear and active. This tool measures how well you are filling your lungs with each breath. Taking long deep breaths may help reverse or decrease the chance of developing breathing  (pulmonary) problems (especially infection) following:  A long period of time when you are unable to move or be active. BEFORE THE PROCEDURE   If the spirometer includes an indicator to show your best effort, your nurse or respiratory therapist will set it to a desired goal.  If possible, sit up straight or lean slightly forward. Try not to slouch.  Hold the incentive spirometer in an upright position. INSTRUCTIONS FOR USE  1. Sit on the edge of your bed if possible, or sit up as far as you can in bed or on a chair. 2. Hold the incentive spirometer in an upright position. 3. Breathe out normally. 4. Place the mouthpiece in your mouth and seal your lips tightly around it. 5. Breathe in slowly and as deeply as possible, raising the piston or the ball toward the top of the column. 6. Hold your breath for 3-5 seconds or for as long as possible. Allow the piston or ball to fall to the bottom of the column. 7. Remove the mouthpiece from your mouth and breathe out normally. 8. Rest for a few seconds and repeat Steps 1 through 7 at least 10 times every 1-2 hours when you are awake. Take your time and take a few normal breaths between deep breaths. 9. The spirometer may include an indicator to show your best effort. Use the indicator as a goal to work toward during each repetition. 10. After each set of 10 deep breaths, practice coughing to be sure your lungs are clear. If you have an incision (the cut made at the time of surgery), support your incision when coughing by placing a pillow or rolled up towels firmly against it. Once you are able to  get out of bed, walk around indoors and cough well. You may stop using the incentive spirometer when instructed by your caregiver.  RISKS AND COMPLICATIONS  Take your time so you do not get dizzy or light-headed.  If you are in pain, you may need to take or ask for pain medication before doing incentive spirometry. It is harder to take a deep breath if you  are having pain. AFTER USE  Rest and breathe slowly and easily.  It can be helpful to keep track of a log of your progress. Your caregiver can provide you with a simple table to help with this. If you are using the spirometer at home, follow these instructions: [place] IF:   You are having difficultly using the spirometer.  You have trouble using the spirometer as often as instructed.  Your pain medication is not giving enough relief while using the spirometer.  You develop fever of 100.5 F (38.1 C) or higher. SEEK IMMEDIATE MEDICAL CARE IF:   You cough up bloody sputum that had not been present before.  You develop fever of 102 F (38.9 C) or greater.  You develop worsening pain at or near the incision site. MAKE SURE YOU:   Understand these instructions.  Will watch your condition.  Will get help right away if you are not doing well or get worse. Document Released: 01/31/2007 Document Revised: 12/13/2011 Document Reviewed: 04/03/2007 ExitCare Patient Information 2014 ExitCare, [place].   ________________________________________________________________________  WHAT IS A BLOOD TRANSFUSION? Blood Transfusion Information  A transfusion is the replacement of blood or some of its parts. Blood is made up of multiple cells which provide different functions.  Red blood cells carry oxygen and are used for blood loss replacement.  White blood cells fight against infection.  Platelets control bleeding.  Plasma helps clot blood.  Other blood products are available for specialized needs, such as hemophilia or other clotting disorders. BEFORE THE TRANSFUSION  Who gives blood for transfusions?   Healthy volunteers who are fully evaluated to make sure their blood is safe. This is blood bank blood. Transfusion therapy is the safest it has ever been in the practice of medicine. Before blood is taken from a donor, a complete history is taken to make sure that person has  no history of diseases nor engages in risky social behavior (examples are intravenous drug use or sexual activity with multiple partners). The donor's travel history is screened to minimize risk of transmitting infections, such as malaria. The donated blood is tested for signs of infectious diseases, such as HIV and hepatitis. The blood is then tested to be sure it is compatible with you in order to minimize the chance of a transfusion reaction. If you or a relative donates blood, this is often done in anticipation of surgery and is not appropriate for emergency situations. It takes many days to process the donated blood. RISKS AND COMPLICATIONS Although transfusion therapy is very safe and saves many lives, the main dangers of transfusion include:   Getting an infectious disease.  Developing a transfusion reaction. This is an allergic reaction to something in the blood you were given. Every precaution is taken to prevent this. The decision to have a blood transfusion has been considered carefully by your caregiver before blood is given. Blood is not given unless the benefits outweigh the risks. AFTER THE TRANSFUSION  Right after receiving a blood transfusion, you will usually feel much better and more energetic. This is especially true if  your red blood cells have gotten low (anemic). The transfusion raises the level of the red blood cells which carry oxygen, and this usually causes an energy increase.  The nurse administering the transfusion will monitor you carefully for complications. HOME CARE INSTRUCTIONS  No special instructions are needed after a transfusion. You may find your energy is better. Speak with your caregiver about any limitations on activity for underlying diseases you may have. SEEK MEDICAL CARE IF:   Your condition is not improving after your transfusion.  You develop redness or irritation at the intravenous (IV) site. SEEK IMMEDIATE MEDICAL CARE IF:  Any of the following  symptoms occur over the next 12 hours:  Shaking chills.  You have a temperature by mouth above 102 F (38.9 C), not controlled by medicine.  Chest, back, or muscle pain.  People around you feel you are not acting correctly or are confused.  Shortness of breath or difficulty breathing.  Dizziness and fainting.  You get a rash or develop hives.  You have a decrease in urine output.  Your urine turns a dark color or changes to pink, red, or brown. Any of the following symptoms occur over the next 10 days:  You have a temperature by mouth above 102 F (38.9 C), not controlled by medicine.  Shortness of breath.  Weakness after normal activity.  The white part of the eye turns yellow (jaundice).  You have a decrease in the amount of urine or are urinating less often.  Your urine turns a dark color or changes to pink, red, or brown. Document Released: 09/17/2000 Document Revised: 12/13/2011 Document Reviewed: 05/06/2008 [facility] Patient Information 2014 [place], [place].  _______________________________________________________________________

## 2017-08-09 ENCOUNTER — Ambulatory Visit (HOSPITAL_COMMUNITY)
Admission: RE | Admit: 2017-08-09 | Discharge: 2017-08-09 | Disposition: A | Discharge status: Home / Self Care | Payer: Medicare Other | Source: Ambulatory Visit | Attending: Orthopedic Surgery | Admitting: Orthopedic Surgery

## 2017-08-09 ENCOUNTER — Other Ambulatory Visit: Payer: Self-pay

## 2017-08-09 ENCOUNTER — Encounter (HOSPITAL_COMMUNITY): Payer: Self-pay

## 2017-08-09 ENCOUNTER — Encounter (HOSPITAL_COMMUNITY)
Admission: RE | Admit: 2017-08-09 | Discharge: 2017-08-09 | Disposition: A | Discharge status: Home / Self Care | Payer: Medicare Other | Source: Ambulatory Visit | Attending: Orthopedic Surgery | Admitting: Orthopedic Surgery

## 2017-08-09 DIAGNOSIS — R0602 Shortness of breath: Secondary | ICD-10-CM | POA: Insufficient documentation

## 2017-08-09 DIAGNOSIS — Z01810 Encounter for preprocedural cardiovascular examination: Secondary | ICD-10-CM | POA: Diagnosis not present

## 2017-08-09 DIAGNOSIS — R062 Wheezing: Secondary | ICD-10-CM

## 2017-08-09 LAB — APTT: aPTT: 32 seconds (ref 24–36)

## 2017-08-09 LAB — CBC
HCT: 38.6 % (ref 36.0–46.0)
Hemoglobin: 13 g/dL (ref 12.0–15.0)
MCH: 31.9 pg (ref 26.0–34.0)
MCHC: 33.7 g/dL (ref 30.0–36.0)
MCV: 94.6 fL (ref 78.0–100.0)
Platelets: 131 10*3/uL — ABNORMAL LOW (ref 150–400)
RBC: 4.08 MIL/uL (ref 3.87–5.11)
RDW: 13.3 % (ref 11.5–15.5)
WBC: 5 10*3/uL (ref 4.0–10.5)

## 2017-08-09 LAB — SURGICAL PCR SCREEN
MRSA, PCR: INVALID — AB
Staphylococcus aureus: INVALID — AB

## 2017-08-09 LAB — COMPREHENSIVE METABOLIC PANEL
ALT: 9 U/L — ABNORMAL LOW (ref 14–54)
AST: 12 U/L — ABNORMAL LOW (ref 15–41)
Albumin: 4.1 g/dL (ref 3.5–5.0)
Alkaline Phosphatase: 52 U/L (ref 38–126)
Anion gap: 8 (ref 5–15)
BUN: 24 mg/dL — ABNORMAL HIGH (ref 6–20)
CO2: 24 mmol/L (ref 22–32)
Calcium: 9.3 mg/dL (ref 8.9–10.3)
Chloride: 105 mmol/L (ref 101–111)
Creatinine, Ser: 1 mg/dL (ref 0.44–1.00)
GFR calc Af Amer: >60 mL/min (ref >60)
GFR calc non Af Amer: 52 mL/min — ABNORMAL LOW (ref >60)
Glucose, Bld: 99 mg/dL (ref 65–99)
Potassium: 4 mmol/L (ref 3.5–5.1)
Sodium: 137 mmol/L (ref 135–145)
Total Bilirubin: 0.9 mg/dL (ref 0.3–1.2)
Total Protein: 6.6 g/dL (ref 6.5–8.1)

## 2017-08-09 LAB — ABO/RH: ABO/RH(D): A POS

## 2017-08-09 LAB — PROTIME-INR
INR: 0.99
Prothrombin Time: 13 seconds (ref 11.4–15.2)

## 2017-08-09 NOTE — Progress Notes (Signed)
08-09-17 CBC result routed to Dr. Wynelle Link for review.

## 2017-08-10 NOTE — Progress Notes (Signed)
CXR results for 08-09-17 routed via epic to [place].  RN also called and LVMM for [employer] at [employer] to inform of results and radiology recommendations. RN to F/U PRN today. RN will report off to initial PAT nurse upon return tomorrow.

## 2017-08-11 ENCOUNTER — Other Ambulatory Visit: Payer: Self-pay | Admitting: Nurse Practitioner

## 2017-08-11 LAB — MRSA CULTURE: Culture: NOT DETECTED

## 2017-08-12 ENCOUNTER — Encounter: Payer: Self-pay | Admitting: Family Medicine

## 2017-08-12 ENCOUNTER — Ambulatory Visit: Payer: Medicare Other | Admitting: Family Medicine

## 2017-08-12 VITALS — BP 138/70 | Temp 97.8°F | Ht 68.0 in | Wt 168.0 lb

## 2017-08-12 DIAGNOSIS — T7840XA Allergy, unspecified, initial encounter: Secondary | ICD-10-CM

## 2017-08-12 DIAGNOSIS — R9389 Abnormal findings on diagnostic imaging of other specified body structures: Secondary | ICD-10-CM

## 2017-08-12 DIAGNOSIS — J189 Pneumonia, unspecified organism: Secondary | ICD-10-CM | POA: Diagnosis not present

## 2017-08-12 MED ORDER — CLARITHROMYCIN 500 MG PO TABS: 500.0000 mg | ORAL_TABLET | Freq: Two times a day (BID) | ORAL | 0 refills | Status: DC

## 2017-08-12 NOTE — Patient Instructions (Signed)
WE NOW OFFER   Kim [name]'s FAST TRACK!!!  SAME DAY Appointments for ACUTE CARE  Such as: Sprains, Injuries, cuts, abrasions, rashes, muscle pain, joint pain, back pain Colds, flu, sore throats, headache, allergies, cough, fever  Ear pain, sinus and eye infections Abdominal pain, nausea, vomiting, diarrhea, upset stomach Animal/insect bites  3 Easy Ways to Schedule: Walk-In Scheduling Call in scheduling Mychart Sign-up: [email or web address]         

## 2017-08-12 NOTE — Progress Notes (Signed)
   Subjective:    Patient ID: Kim [name], female    DOB: September 02, 1935, 82 y.o.   MRN: [id number]  HPI Here asking for help with an antibiotic. She has been waiting to have a knee replacement per Dr. Wynelle Link. She has had no cough or other URI symptoms. However on 08-09-17 she had a routine CXR which showed a streaky density near the right inferior hilum which appeared to be a developing pneumonia. Their plan was to treat this with antibiotics and then recheck her before scheduling the surgery. She was prescribed Levaquin and she took the first dose last night. However during the night an into this morning she developed intense itching in the trunk, neck, and scalp. She was told to stop taking the Levaquin and to take some Benadryl, which she did. She is now asking what she should do. The itching has now stopped and she feels back to her baseline.    Review of Systems  Constitutional: Negative.   HENT: Negative.   Eyes: Negative.   Respiratory: Negative.   Cardiovascular: Negative.        Objective:   Physical Exam  Constitutional: She is oriented to person, place, and time. She appears well-developed and well-nourished.  HENT:  Right Ear: External ear normal.  Left Ear: External ear normal.  Nose: Nose normal.  Mouth/Throat: Oropharynx is clear and moist.  Eyes: Conjunctivae are normal.  Neck: Normal range of motion. Neck supple. No thyromegaly present.  Cardiovascular: Normal rate, regular rhythm, normal heart sounds and intact distal pulses.  Pulmonary/Chest: Effort normal and breath sounds normal. No respiratory distress. She has no wheezes. She has no rales.  Lymphadenopathy:    She has no cervical adenopathy.  Neurological: She is alert and oriented to person, place, and time.          Assessment & Plan:  She has a possible early pneumonia seen on a recent CXR, though she has no symptoms. She has had an allergic reaction to a single dose of Levaquin so this was stopped. We  will start her on Biaxin XL 500 mg bid for 10 days. She will see [place] again in 10 days for a re-exam, and at that point we will get another CXR. Her knee surgery will be postponed until after this resolves.  Alysia Penna, MD

## 2017-08-12 NOTE — Progress Notes (Signed)
Spoke to Kim [name] in [facility] to verify if PCR was also tested for Staph. MRSA results are negative. However no reference to Staph.  Per Kim [name], the protocol is if a specimen indicates that the results are invalid. Specimen is then sent to [facility] for culture. He is confident that Kim [name] would have tested for Staph as well as MRSA.

## 2017-08-15 ENCOUNTER — Ambulatory Visit: Payer: Medicare Other | Admitting: Family Medicine

## 2017-08-15 ENCOUNTER — Inpatient Hospital Stay (HOSPITAL_COMMUNITY): Admission: RE | Admit: 2017-08-15 | Payer: Medicare Other | Source: Ambulatory Visit | Admitting: Orthopedic Surgery

## 2017-08-15 ENCOUNTER — Encounter (HOSPITAL_COMMUNITY): Admission: RE | Payer: Self-pay | Source: Ambulatory Visit

## 2017-08-15 LAB — TYPE AND SCREEN
ABO/RH(D): A POS
Antibody Screen: NEGATIVE

## 2017-08-15 SURGERY — ARTHROPLASTY, KNEE, TOTAL
Anesthesia: Choice | Site: Knee | Laterality: Right

## 2017-08-18 ENCOUNTER — Ambulatory Visit: Payer: Medicare Other | Admitting: Physical Therapy

## 2017-08-24 ENCOUNTER — Ambulatory Visit: Payer: Medicare Other | Admitting: Family Medicine

## 2017-08-24 ENCOUNTER — Encounter: Payer: Self-pay | Admitting: Family Medicine

## 2017-08-24 ENCOUNTER — Ambulatory Visit (INDEPENDENT_AMBULATORY_CARE_PROVIDER_SITE_OTHER)
Admission: RE | Admit: 2017-08-24 | Discharge: 2017-08-24 | Disposition: A | Discharge status: Home / Self Care | Payer: Medicare Other | Source: Ambulatory Visit | Attending: Family Medicine | Admitting: Family Medicine

## 2017-08-24 ENCOUNTER — Telehealth: Payer: Self-pay | Admitting: Family Medicine

## 2017-08-24 VITALS — BP 120/76 | HR 67 | Temp 98.3°F | Wt 167.6 lb

## 2017-08-24 DIAGNOSIS — J189 Pneumonia, unspecified organism: Secondary | ICD-10-CM

## 2017-08-24 NOTE — Progress Notes (Signed)
   Subjective:    Patient ID: Kim [name], female    DOB: 06/01/1935, 82 y.o.   MRN: [id number]  HPI Here to follow up a possible pneumonia. She was seen here on 08-12-17 after a preop CXR revealed a possible infiltrate in the right lung near the hilum. She had no symptoms at all, but her knee surgery was cancelled and we gave her a course of Biaxin. Today she still feels fine.    Review of Systems  Constitutional: Negative.   HENT: Negative.   Eyes: Negative.   Respiratory: Negative.   Cardiovascular: Negative.        Objective:   Physical Exam  Constitutional: She appears well-developed and well-nourished.  Neck: No thyromegaly present.  Cardiovascular: Normal rate, regular rhythm, normal heart sounds and intact distal pulses.  Pulmonary/Chest: Effort normal and breath sounds normal. No respiratory distress. She has no wheezes. She has no rales.  Lymphadenopathy:    She has no cervical adenopathy.          Assessment & Plan:  Recently treated pneumonia. She will go for another CXR today.  Alysia Penna, MD

## 2017-08-24 NOTE — Telephone Encounter (Signed)
Sent to PCP are results in yet?

## 2017-08-24 NOTE — Telephone Encounter (Signed)
Pt advised of her X-ray results and voiced understanding.

## 2017-08-24 NOTE — Telephone Encounter (Signed)
Copied from [place]. Topic: Inquiry >> Aug 24, 2017  3:50 PM Corie Chiquito, [place] wrote: Reason for CRM: Patient calling to check on her x-ray that she had done today. If someone could please give her a call back. If there is no answer please leave msg

## 2017-08-24 NOTE — Telephone Encounter (Signed)
See my Result Note  

## 2017-08-24 NOTE — Telephone Encounter (Signed)
Copied from [place]. Topic: Inquiry >> Aug 24, 2017  3:50 PM Corie Chiquito, [place] wrote: Reason for CRM: Patient calling to check on her x-ray that she had done today. If someone could please give her a call back. If there is no answer please leave message  See results note, CMA spoke with pt.

## 2017-09-01 ENCOUNTER — Telehealth: Payer: Self-pay | Admitting: *Deleted

## 2017-09-01 NOTE — Telephone Encounter (Signed)
Copied from [place] #[id number]. Topic: Inquiry >> Sep 01, 2017  2:36 PM Scherrie Gerlach wrote: Reason for CRM:  Pt  states she is returning [place] call. Pt states about a surgery clearance letter for surgery

## 2017-09-02 NOTE — Telephone Encounter (Signed)
Pt calling about her lab results done 08/09/2017. Plus she wanted to know is she good to have her surgery with Gaynelle Arabian @  [facility] since her X-ray looked good? She originally suppose to have surgery on 08/15/2017 but was postpone and she had to see Dr. Sarajane Jews.

## 2017-09-02 NOTE — Telephone Encounter (Signed)
Need to have them fax [place] a form

## 2017-09-02 NOTE — Telephone Encounter (Signed)
Yes she may proceed with the surgery at any time. Please contact Dr. Anne Fu office ([place]) to tell them she is cleared

## 2017-09-05 NOTE — Telephone Encounter (Signed)
Faxed [facility] attention to Dr. Wynelle Link asking them to fax [place] there surgical clearance form since pt is cleared for surgery.

## 2017-09-06 NOTE — Telephone Encounter (Signed)
Form was faxed over today. Form completed and sent to be scanned placed to be scanned into pt's chart.

## 2017-09-06 NOTE — Telephone Encounter (Signed)
Pt is calling to let [facility] know Kim [name] from dr Wynelle Link office will fax another form

## 2017-09-06 NOTE — Telephone Encounter (Signed)
Called the pt to advise her to try and reach out the [facility] for clearance surgery form. I faxed them yesterday and have not received any thing back yet.

## 2017-10-13 ENCOUNTER — Ambulatory Visit: Payer: Self-pay | Admitting: Orthopedic Surgery

## 2017-10-14 NOTE — Patient Instructions (Addendum)
Kim [name]  10/14/2017   Your procedure is scheduled on: 10-24-17   Report to [facility] Main  Entrance Follow signs to Short Stay on first floor at 5:30 AM    Call this number if you have problems the morning of surgery [phone]   Remember: Do not eat food or drink liquids :After Midnight.     Take these medicines the morning of surgery with A SIP OF WATER: Levothyroxine (Synthroid), and Metoprolol Succinate (Toprol-XL)                                You may not have any metal on your body including hair pins and              piercings  Do not wear jewelry, make-up, lotions, powders or perfumes, deodorant             Do not wear nail polish.  Do not shave  48 hours prior to surgery.           Do not bring valuables to the hospital. [place].  Contacts, dentures or bridgework may not be worn into surgery.  Leave suitcase in the car. After surgery it may be brought to your room.                  Please read over the following fact sheets you were given: _____________________________________________________________________             [facility] - Preparing for Surgery Before surgery, you can play an important role.  Because skin is not sterile, your skin needs to be as free of germs as possible.  You can reduce the number of germs on your skin by washing with CHG (chlorahexidine gluconate) soap before surgery.  CHG is an antiseptic cleaner which kills germs and bonds with the skin to continue killing germs even after washing. Please DO NOT use if you have an allergy to CHG or antibacterial soaps.  If your skin becomes reddened/irritated stop using the CHG and inform your nurse when you arrive at Short Stay. Do not shave (including legs and underarms) for at least 48 hours prior to the first CHG shower.  You may shave your face/neck. Please follow these instructions carefully:  1.  Shower with CHG  Soap the night before surgery and the  morning of Surgery.  2.  If you choose to wash your hair, wash your hair first as usual with your  normal  shampoo.  3.  After you shampoo, rinse your hair and body thoroughly to remove the  shampoo.                           4.  Use CHG as you would any other liquid soap.  You can apply chg directly  to the skin and wash                       Gently with a scrungie or clean washcloth.  5.  Apply the CHG Soap to your body ONLY FROM THE NECK DOWN.   Do not use on face/ open  Wound or open sores. Avoid contact with eyes, ears mouth and genitals (private parts).                       Wash face,  Genitals (private parts) with your normal soap.             6.  Wash thoroughly, paying special attention to the area where your surgery  will be performed.  7.  Thoroughly rinse your body with warm water from the neck down.  8.  DO NOT shower/wash with your normal soap after using and rinsing off  the CHG Soap.                9.  Pat yourself dry with a clean towel.            10.  Wear clean pajamas.            11.  Place clean sheets on your bed the night of your first shower and do not  sleep with pets. Day of Surgery : Do not apply any lotions/deodorants the morning of surgery.  Please wear clean clothes to the hospital/surgery center.  FAILURE TO FOLLOW THESE INSTRUCTIONS MAY RESULT IN THE CANCELLATION OF YOUR SURGERY PATIENT SIGNATURE_________________________________  NURSE SIGNATURE__________________________________  ________________________________________________________________________   Kim [name]  An incentive spirometer is a tool that can help keep your lungs clear and active. This tool measures how well you are filling your lungs with each breath. Taking long deep breaths may help reverse or decrease the chance of developing breathing (pulmonary) problems (especially infection) following:  A long period of time  when you are unable to move or be active. BEFORE THE PROCEDURE   If the spirometer includes an indicator to show your best effort, your nurse or respiratory therapist will set it to a desired goal.  If possible, sit up straight or lean slightly forward. Try not to slouch.  Hold the incentive spirometer in an upright position. INSTRUCTIONS FOR USE  1. Sit on the edge of your bed if possible, or sit up as far as you can in bed or on a chair. 2. Hold the incentive spirometer in an upright position. 3. Breathe out normally. 4. Place the mouthpiece in your mouth and seal your lips tightly around it. 5. Breathe in slowly and as deeply as possible, raising the piston or the ball toward the top of the column. 6. Hold your breath for 3-5 seconds or for as long as possible. Allow the piston or ball to fall to the bottom of the column. 7. Remove the mouthpiece from your mouth and breathe out normally. 8. Rest for a few seconds and repeat Steps 1 through 7 at least 10 times every 1-2 hours when you are awake. Take your time and take a few normal breaths between deep breaths. 9. The spirometer may include an indicator to show your best effort. Use the indicator as a goal to work toward during each repetition. 10. After each set of 10 deep breaths, practice coughing to be sure your lungs are clear. If you have an incision (the cut made at the time of surgery), support your incision when coughing by placing a pillow or rolled up towels firmly against it. Once you are able to get out of bed, walk around indoors and cough well. You may stop using the incentive spirometer when instructed by your caregiver.  RISKS AND COMPLICATIONS  Take your time so you do not get  dizzy or light-headed.  If you are in pain, you may need to take or ask for pain medication before doing incentive spirometry. It is harder to take a deep breath if you are having pain. AFTER USE  Rest and breathe slowly and easily.  It can be  helpful to keep track of a log of your progress. Your caregiver can provide you with a simple table to help with this. If you are using the spirometer at home, follow these instructions: [place] IF:   You are having difficultly using the spirometer.  You have trouble using the spirometer as often as instructed.  Your pain medication is not giving enough relief while using the spirometer.  You develop fever of 100.5 F (38.1 C) or higher. SEEK IMMEDIATE MEDICAL CARE IF:   You cough up bloody sputum that had not been present before.  You develop fever of 102 F (38.9 C) or greater.  You develop worsening pain at or near the incision site. MAKE SURE YOU:   Understand these instructions.  Will watch your condition.  Will get help right away if you are not doing well or get worse. Document Released: 01/31/2007 Document Revised: 12/13/2011 Document Reviewed: 04/03/2007 ExitCare Patient Information 2014 ExitCare, [place].   ________________________________________________________________________  WHAT IS A BLOOD TRANSFUSION? Blood Transfusion Information  A transfusion is the replacement of blood or some of its parts. Blood is made up of multiple cells which provide different functions.  Red blood cells carry oxygen and are used for blood loss replacement.  White blood cells fight against infection.  Platelets control bleeding.  Plasma helps clot blood.  Other blood products are available for specialized needs, such as hemophilia or other clotting disorders. BEFORE THE TRANSFUSION  Who gives blood for transfusions?   Healthy volunteers who are fully evaluated to make sure their blood is safe. This is blood bank blood. Transfusion therapy is the safest it has ever been in the practice of medicine. Before blood is taken from a donor, a complete history is taken to make sure that person has no history of diseases nor engages in risky social behavior (examples are  intravenous drug use or sexual activity with multiple partners). The donor's travel history is screened to minimize risk of transmitting infections, such as malaria. The donated blood is tested for signs of infectious diseases, such as HIV and hepatitis. The blood is then tested to be sure it is compatible with you in order to minimize the chance of a transfusion reaction. If you or a relative donates blood, this is often done in anticipation of surgery and is not appropriate for emergency situations. It takes many days to process the donated blood. RISKS AND COMPLICATIONS Although transfusion therapy is very safe and saves many lives, the main dangers of transfusion include:   Getting an infectious disease.  Developing a transfusion reaction. This is an allergic reaction to something in the blood you were given. Every precaution is taken to prevent this. The decision to have a blood transfusion has been considered carefully by your caregiver before blood is given. Blood is not given unless the benefits outweigh the risks. AFTER THE TRANSFUSION  Right after receiving a blood transfusion, you will usually feel much better and more energetic. This is especially true if your red blood cells have gotten low (anemic). The transfusion raises the level of the red blood cells which carry oxygen, and this usually causes an energy increase.  The nurse administering the transfusion will  monitor you carefully for complications. HOME CARE INSTRUCTIONS  No special instructions are needed after a transfusion. You may find your energy is better. Speak with your caregiver about any limitations on activity for underlying diseases you may have. SEEK MEDICAL CARE IF:   Your condition is not improving after your transfusion.  You develop redness or irritation at the intravenous (IV) site. SEEK IMMEDIATE MEDICAL CARE IF:  Any of the following symptoms occur over the next 12 hours:  Shaking chills.  You have a  temperature by mouth above 102 F (38.9 C), not controlled by medicine.  Chest, back, or muscle pain.  People around you feel you are not acting correctly or are confused.  Shortness of breath or difficulty breathing.  Dizziness and fainting.  You get a rash or develop hives.  You have a decrease in urine output.  Your urine turns a dark color or changes to pink, red, or brown. Any of the following symptoms occur over the next 10 days:  You have a temperature by mouth above 102 F (38.9 C), not controlled by medicine.  Shortness of breath.  Weakness after normal activity.  The white part of the eye turns yellow (jaundice).  You have a decrease in the amount of urine or are urinating less often.  Your urine turns a dark color or changes to pink, red, or brown. Document Released: 09/17/2000 Document Revised: 12/13/2011 Document Reviewed: 05/06/2008 [facility] Patient Information 2014 [place], [place].  _______________________________________________________________________

## 2017-10-14 NOTE — Progress Notes (Signed)
09-01-17 (Epic) Surgical Clearance from Dr. Sarajane Jews in Telephone encounter.  08-05-17 (Epic) CXR  05-05-17 (Epic) EKG  01-03-17 (Epic) ECHO

## 2017-10-18 ENCOUNTER — Encounter (HOSPITAL_COMMUNITY)
Admission: RE | Admit: 2017-10-18 | Discharge: 2017-10-18 | Disposition: A | Discharge status: Home / Self Care | Payer: Medicare Other | Source: Ambulatory Visit | Attending: Orthopedic Surgery | Admitting: Orthopedic Surgery

## 2017-10-18 ENCOUNTER — Encounter (HOSPITAL_COMMUNITY): Payer: Self-pay

## 2017-10-18 ENCOUNTER — Other Ambulatory Visit: Payer: Self-pay

## 2017-10-18 ENCOUNTER — Ambulatory Visit: Payer: Self-pay | Admitting: Orthopedic Surgery

## 2017-10-18 DIAGNOSIS — M1711 Unilateral primary osteoarthritis, right knee: Secondary | ICD-10-CM | POA: Insufficient documentation

## 2017-10-18 DIAGNOSIS — Z01812 Encounter for preprocedural laboratory examination: Secondary | ICD-10-CM | POA: Diagnosis not present

## 2017-10-18 LAB — COMPREHENSIVE METABOLIC PANEL
ALT: 10 U/L — ABNORMAL LOW (ref 14–54)
AST: 14 U/L — ABNORMAL LOW (ref 15–41)
Albumin: 4.4 g/dL (ref 3.5–5.0)
Alkaline Phosphatase: 45 U/L (ref 38–126)
Anion gap: 5 (ref 5–15)
BUN: 24 mg/dL — ABNORMAL HIGH (ref 6–20)
CO2: 28 mmol/L (ref 22–32)
Calcium: 9.5 mg/dL (ref 8.9–10.3)
Chloride: 103 mmol/L (ref 101–111)
Creatinine, Ser: 1.06 mg/dL — ABNORMAL HIGH (ref 0.44–1.00)
GFR calc Af Amer: 56 mL/min — ABNORMAL LOW (ref >60)
GFR calc non Af Amer: 48 mL/min — ABNORMAL LOW (ref >60)
Glucose, Bld: 88 mg/dL (ref 65–99)
Potassium: 4.8 mmol/L (ref 3.5–5.1)
Sodium: 136 mmol/L (ref 135–145)
Total Bilirubin: 0.9 mg/dL (ref 0.3–1.2)
Total Protein: 7.1 g/dL (ref 6.5–8.1)

## 2017-10-18 LAB — CBC
HCT: 40.6 % (ref 36.0–46.0)
Hemoglobin: 13.5 g/dL (ref 12.0–15.0)
MCH: 31.9 pg (ref 26.0–34.0)
MCHC: 33.3 g/dL (ref 30.0–36.0)
MCV: 96 fL (ref 78.0–100.0)
Platelets: 141 10*3/uL — ABNORMAL LOW (ref 150–400)
RBC: 4.23 MIL/uL (ref 3.87–5.11)
RDW: 13.2 % (ref 11.5–15.5)
WBC: 5 10*3/uL (ref 4.0–10.5)

## 2017-10-18 LAB — SURGICAL PCR SCREEN
MRSA, PCR: NEGATIVE
Staphylococcus aureus: NEGATIVE

## 2017-10-18 LAB — PROTIME-INR
INR: 1.09
Prothrombin Time: 14 seconds (ref 11.4–15.2)

## 2017-10-18 LAB — APTT: aPTT: 32 seconds (ref 24–36)

## 2017-10-18 NOTE — H&P (Signed)
Kim [name] [phone], F) DOB 1935-06-07   Chief Complaint:  Right knee pain H&P right total knee scheduled 10/24/17  Vitals 10/14/2017 10:41 am Ht: 5 ft 7 in  Wt: 163 lbs  BMI: 25.5 Pain Scale: 5 Pain Scale Type: Numeric   Allergies Reviewed Allergies ATORVASTATIN: Arthralgia (joint pain)  CORTISONE: Rash  DARVOCET-N: - Tongue Swelling  MYRBETRIQ: Rash  PENICILLINS: Facial swelling  PRAVASTATIN: Arthralgia (joint pain)  PSEUDOEPHEDRINE: Flushing  REPATHA SURECLICK: Arthralgia (joint pain) - Fatigue, Itching, Feet Burning  ROSUVASTATIN: Arthralgia (joint pain)  STATINS-HMG-COA REDUCTASE INHIBITORS: Arthralgia (joint pain)  Medications Reviewed Medications aspirin 81 mg chewable tablet  Emla 2.5 %-2.5 % topical cream  exemestane  Fluad 2018-19 65yr up(PF)45 mcg(15 mcgx3)/0.5 mL intramuscular syringe ADM 0.5ML IM UTD  gabapentin  Lasix  levothyroxine  loperamide  metoprolol succinate ER 50 mg tablet,extended release 24 hr  multivitamin  Natural Co-Q10 10 mg tablet  Nitrostat 0.4 mg sublingual tablet  [place] Calcium Plus Vitamin D  red yeast rice  Vitamin B12  Vitamin C  ZyrTEC 10 mg capsule    Family History Reviewed Family History Father - Father deceased Mother - Mother deceased   Social History Reviewed Social History Smoking Status: Never smoker Non-smoker Chewing tobacco: none Alcohol intake: None Hand Dominance: Right Work related injury?: N Advance directive: Y Freight forwarder of Attorney: Y Married, Lives with Family   Surgical History Reviewed Surgical History Placement of stent in coronary artery - 2015 Breast surgery procedure - Breast Cancer Left Sided   Past Medical History Reviewed Past Medical History Cancer: Y - Left-sided Breast Cancer Heart Problems: Y High Cholesterol: Y Thyroid Problems/Goiter: Y Notes: Heart Attack - 2015,  Heart Murmur   HPI The patient is a 83 year old female who is seen for a  preoperative History and Physical. The patient is scheduled for a right total knee arthroplasty to be performed by Dr. Dione Plover. Aluisio, MD at [facility] on 10-24-2017. The patient is a 83 year old female who presented for and followed for their right knee. The patient is being followed for their right knee pain and osteoarthritis. They are now month(s) out from [place] injection. Symptoms reported include: pain, aching and difficulty ambulating, while the patient does not report symptoms of: pain at night. The patient feels that they are doing poorly and report their pain level to be moderate. Current treatment includes: home exercise program and activity modification. The following medication has been used for pain control: Tylenol (Arthritis 650mg s 1 every 8-9 hrs during the day). The patient has reported some temporary improvement of their symptoms with Cortisone injections but they do not last long. She continues to have pain in the knee with difficulty ambulation. She feels she is doing poorly with regards to the right knee. She has been icing which is mostly at night with very little bit of she is having some buckling and giving out with the knee she continues to have pain despite conservative measures. She has reached a point where she would like to get the knee replaced. She has a recent bout of pneumonia this past fall but has been followed up be her PCP and cleared to proceed with the surgery. Risks and benefits have been discussed with the patient and she wishes to proceed with the surgery at this time.   ROS Constitutional: Constitutional: no significant weight gain or loss and no fever.  HEENT: Eyes: no irritation, dry eyes, vision change, or sore throat.  Cardiovascular:  Cardiovascular: no palpitations or chest pain; She does have a cardiac murmur.  Respiratory: Respiratory: no cough or shortness of breath and No COPD; recent bout of PMN but has been treated and cleared to  proceed with surgery.  Gastrointestinal: Gastrointestinal: no vomiting or diarrhea and not vomiting blood.  Genitourinary: Genitourinary: no blood in urine or difficulty urinating.  Musculoskeletal: Musculoskeletal: Joint Pain.   Physical Exam Patient is an 83 year old female.  General Mental Status - Alert, cooperative and good historian. General Appearance - pleasant, Not in acute distress. Orientation - Oriented X3. Build & Nutrition - Well nourished and Well developed.  Head and Neck Hearing loss noted, Bialteral hearing aids Head - normocephalic, atraumatic . Global Assessment - supple, slight bruit auscultated on the right, slight bruit auscultated on the left. Partial lower denture plate  Eye Pupil - Bilateral - Regular and Round. Motion - Bilateral - EOMI.  Chest and Lung Exam Auscultation Breath sounds - clear at anterior chest wall and clear at posterior chest wall on left. Adventitious sounds - end inspiratory wheeze noted in right base and mid fields that improves with deep coughing  Cardiovascular Auscultation Rhythm - Regular rate and rhythm. Heart Sounds - S1 WNL and S2 WNL. Cardiac murmur noted over the aortic point and along the left sternal border, 3/6, pansystolic murmur with slight radiation into the neck and both carotids.  Abdomen Tenderness - Abdomen is non-tender to palpation. Rigidity (guarding) - Abdomen is soft. Auscultation of the abdomen reveals - Bowel sounds normal.  Female Genitourinary Note: Not done, not pertinent to present illness   Musculoskeletal Right knee is examined which shows lack of 5 of full extension with active flexion 130 and passive flexion 135. She has moderate crepitus noted on passive range of motion. Slight varus malalignment, no effusion, tender medial more so than lateral joint line.   Assessment / Plan 1. Osteoarthritis of right knee joint M17.11: Unilateral primary osteoarthritis, right knee  Discussion  Notes Surgical Plans: Right Total Knee Replacement  Disposition: Home with family, Straight to outpatient therapy  PCP: Dr. Delma Freeze   Topical TXA - History of MI  Anesthesia Issues: None  Patient was instructed on what medications to stop prior to surgery.  Return to Office Gaynelle Arabian, MD for Post-Op at [facility] on or around 11/08/2017   Encounter signed-off by Mickel Crow, PA-C

## 2017-10-18 NOTE — H&P (View-Only) (Signed)
Kim [name] [id number], F) DOB 22-Nov-1934   Chief Complaint:  Right knee pain H&P right total knee scheduled 10/24/17  Vitals 10/14/2017 10:41 am Ht: 5 ft 7 in  Wt: 163 lbs  BMI: 25.5 Pain Scale: 5 Pain Scale Type: Numeric   Allergies Reviewed Allergies ATORVASTATIN: Arthralgia (joint pain)  CORTISONE: Rash  DARVOCET-N: - Tongue Swelling  MYRBETRIQ: Rash  PENICILLINS: Facial swelling  PRAVASTATIN: Arthralgia (joint pain)  PSEUDOEPHEDRINE: Flushing  REPATHA SURECLICK: Arthralgia (joint pain) - Fatigue, Itching, Feet Burning  ROSUVASTATIN: Arthralgia (joint pain)  STATINS-HMG-COA REDUCTASE INHIBITORS: Arthralgia (joint pain)  Medications Reviewed Medications aspirin 81 mg chewable tablet  Emla 2.5 %-2.5 % topical cream  exemestane  Fluad 2018-19 40yr up(PF)45 mcg(15 mcgx3)/0.5 mL intramuscular syringe ADM 0.5ML IM UTD  gabapentin  Lasix  levothyroxine  loperamide  metoprolol succinate ER 50 mg tablet,extended release 24 hr  multivitamin  Natural Co-Q10 10 mg tablet  Nitrostat 0.4 mg sublingual tablet  [place] Calcium Plus Vitamin D  red yeast rice  Vitamin B12  Vitamin C  ZyrTEC 10 mg capsule    Family History Reviewed Family History Father - Father deceased Mother - Mother deceased   Social History Reviewed Social History Smoking Status: Never smoker Non-smoker Chewing tobacco: none Alcohol intake: None Hand Dominance: Right Work related injury?: N Advance directive: Y Freight forwarder of Attorney: Y Married, Lives with Family   Surgical History Reviewed Surgical History Placement of stent in coronary artery - 2015 Breast surgery procedure - Breast Cancer Left Sided   Past Medical History Reviewed Past Medical History Cancer: Y - Left-sided Breast Cancer Heart Problems: Y High Cholesterol: Y Thyroid Problems/Goiter: Y Notes: Heart Attack - 2015,  Heart Murmur   HPI The patient is a 83 year old female who is seen for a  preoperative History and Physical. The patient is scheduled for a right total knee arthroplasty to be performed by Dr. Dione Plover. Aluisio, MD at [facility] on 10-24-2017. The patient is a 83 year old female who presented for and followed for their right knee. The patient is being followed for their right knee pain and osteoarthritis. They are now month(s) out from [place] injection. Symptoms reported include: pain, aching and difficulty ambulating, while the patient does not report symptoms of: pain at night. The patient feels that they are doing poorly and report their pain level to be moderate. Current treatment includes: home exercise program and activity modification. The following medication has been used for pain control: Tylenol (Arthritis 650mg s 1 every 8-9 hrs during the day). The patient has reported some temporary improvement of their symptoms with Cortisone injections but they do not last long. She continues to have pain in the knee with difficulty ambulation. She feels she is doing poorly with regards to the right knee. She has been icing which is mostly at night with very little bit of she is having some buckling and giving out with the knee she continues to have pain despite conservative measures. She has reached a point where she would like to get the knee replaced. She has a recent bout of pneumonia this past fall but has been followed up be her PCP and cleared to proceed with the surgery. Risks and benefits have been discussed with the patient and she wishes to proceed with the surgery at this time.   ROS Constitutional: Constitutional: no significant weight gain or loss and no fever.  HEENT: Eyes: no irritation, dry eyes, vision change, or sore throat.  Cardiovascular:  Cardiovascular: no palpitations or chest pain; She does have a cardiac murmur.  Respiratory: Respiratory: no cough or shortness of breath and No COPD; recent bout of PMN but has been treated and cleared to  proceed with surgery.  Gastrointestinal: Gastrointestinal: no vomiting or diarrhea and not vomiting blood.  Genitourinary: Genitourinary: no blood in urine or difficulty urinating.  Musculoskeletal: Musculoskeletal: Joint Pain.   Physical Exam Patient is an 83 year old female.  General Mental Status - Alert, cooperative and good historian. General Appearance - pleasant, Not in acute distress. Orientation - Oriented X3. Build & Nutrition - Well nourished and Well developed.  Head and Neck Hearing loss noted, Bialteral hearing aids Head - normocephalic, atraumatic . Global Assessment - supple, slight bruit auscultated on the right, slight bruit auscultated on the left. Partial lower denture plate  Eye Pupil - Bilateral - Regular and Round. Motion - Bilateral - EOMI.  Chest and Lung Exam Auscultation Breath sounds - clear at anterior chest wall and clear at posterior chest wall on left. Adventitious sounds - end inspiratory wheeze noted in right base and mid fields that improves with deep coughing  Cardiovascular Auscultation Rhythm - Regular rate and rhythm. Heart Sounds - S1 WNL and S2 WNL. Cardiac murmur noted over the aortic point and along the left sternal border, 3/6, pansystolic murmur with slight radiation into the neck and both carotids.  Abdomen Tenderness - Abdomen is non-tender to palpation. Rigidity (guarding) - Abdomen is soft. Auscultation of the abdomen reveals - Bowel sounds normal.  Female Genitourinary Note: Not done, not pertinent to present illness   Musculoskeletal Right knee is examined which shows lack of 5 of full extension with active flexion 130 and passive flexion 135. She has moderate crepitus noted on passive range of motion. Slight varus malalignment, no effusion, tender medial more so than lateral joint line.   Assessment / Plan 1. Osteoarthritis of right knee joint M17.11: Unilateral primary osteoarthritis, right knee  Discussion  Notes Surgical Plans: Right Total Knee Replacement  Disposition: Home with family, Straight to outpatient therapy  PCP: Dr. Delma Freeze   Topical TXA - History of MI  Anesthesia Issues: None  Patient was instructed on what medications to stop prior to surgery.  Return to Office Gaynelle Arabian, MD for Post-Op at [facility] on or around 11/08/2017   Encounter signed-off by Mickel Crow, PA-C

## 2017-10-19 NOTE — Progress Notes (Signed)
10-18-17 CMP result routed to Dr. Wynelle Link for review.

## 2017-10-23 MED ORDER — BUPIVACAINE LIPOSOME 1.3 % IJ SUSP
20.0000 mL | INTRAMUSCULAR | Status: DC
  Filled 2017-10-23: qty 20

## 2017-10-23 MED ORDER — TRANEXAMIC ACID 1000 MG/10ML IV SOLN
2000.0000 mg | INTRAVENOUS | Status: DC
  Filled 2017-10-23: qty 20

## 2017-10-24 ENCOUNTER — Inpatient Hospital Stay (HOSPITAL_COMMUNITY): Payer: Medicare Other | Admitting: Certified Registered Nurse Anesthetist

## 2017-10-24 ENCOUNTER — Inpatient Hospital Stay (HOSPITAL_COMMUNITY)
Admission: RE | Admit: 2017-10-24 | Discharge: 2017-10-26 | DRG: 470 | Disposition: A | Discharge status: Home / Self Care | Payer: Medicare Other | Source: Ambulatory Visit | Attending: Orthopedic Surgery | Admitting: Orthopedic Surgery

## 2017-10-24 ENCOUNTER — Encounter (HOSPITAL_COMMUNITY): Payer: Self-pay | Admitting: *Deleted

## 2017-10-24 ENCOUNTER — Encounter (HOSPITAL_COMMUNITY)
Admission: RE | Disposition: A | Discharge status: Home / Self Care | Payer: Self-pay | Source: Ambulatory Visit | Attending: Orthopedic Surgery

## 2017-10-24 ENCOUNTER — Other Ambulatory Visit: Payer: Self-pay

## 2017-10-24 DIAGNOSIS — Z923 Personal history of irradiation: Secondary | ICD-10-CM | POA: Diagnosis not present

## 2017-10-24 DIAGNOSIS — Z955 Presence of coronary angioplasty implant and graft: Secondary | ICD-10-CM | POA: Diagnosis not present

## 2017-10-24 DIAGNOSIS — I252 Old myocardial infarction: Secondary | ICD-10-CM

## 2017-10-24 DIAGNOSIS — Z853 Personal history of malignant neoplasm of breast: Secondary | ICD-10-CM | POA: Diagnosis not present

## 2017-10-24 DIAGNOSIS — R011 Cardiac murmur, unspecified: Secondary | ICD-10-CM | POA: Diagnosis not present

## 2017-10-24 DIAGNOSIS — M179 Osteoarthritis of knee, unspecified: Secondary | ICD-10-CM | POA: Diagnosis present

## 2017-10-24 DIAGNOSIS — M1711 Unilateral primary osteoarthritis, right knee: Secondary | ICD-10-CM | POA: Diagnosis not present

## 2017-10-24 DIAGNOSIS — I25119 Atherosclerotic heart disease of native coronary artery with unspecified angina pectoris: Secondary | ICD-10-CM | POA: Diagnosis present

## 2017-10-24 DIAGNOSIS — Z885 Allergy status to narcotic agent status: Secondary | ICD-10-CM

## 2017-10-24 DIAGNOSIS — M25561 Pain in right knee: Secondary | ICD-10-CM | POA: Diagnosis present

## 2017-10-24 DIAGNOSIS — E7800 Pure hypercholesterolemia, unspecified: Secondary | ICD-10-CM | POA: Diagnosis not present

## 2017-10-24 DIAGNOSIS — Z888 Allergy status to other drugs, medicaments and biological substances status: Secondary | ICD-10-CM | POA: Diagnosis not present

## 2017-10-24 DIAGNOSIS — Z880 Allergy status to penicillin: Secondary | ICD-10-CM | POA: Diagnosis not present

## 2017-10-24 DIAGNOSIS — E049 Nontoxic goiter, unspecified: Secondary | ICD-10-CM | POA: Diagnosis present

## 2017-10-24 DIAGNOSIS — I10 Essential (primary) hypertension: Secondary | ICD-10-CM | POA: Diagnosis not present

## 2017-10-24 DIAGNOSIS — M1710 Unilateral primary osteoarthritis, unspecified knee: Secondary | ICD-10-CM

## 2017-10-24 HISTORY — PX: TOTAL KNEE ARTHROPLASTY: SHX125

## 2017-10-24 LAB — TYPE AND SCREEN
ABO/RH(D): A POS
Antibody Screen: NEGATIVE

## 2017-10-24 SURGERY — ARTHROPLASTY, KNEE, TOTAL
Anesthesia: Spinal | Site: Knee | Laterality: Right

## 2017-10-24 MED ORDER — ACETAMINOPHEN 325 MG PO TABS: 650.0000 mg | ORAL_TABLET | ORAL | Status: DC | PRN

## 2017-10-24 MED ORDER — TRANEXAMIC ACID 1000 MG/10ML IV SOLN
1000.0000 mg | Freq: Once | INTRAVENOUS | Status: DC
  Filled 2017-10-24: qty 10

## 2017-10-24 MED ORDER — ONDANSETRON HCL 4 MG/2ML IJ SOLN: 4.0000 mg | Freq: Four times a day (QID) | INTRAMUSCULAR | Status: DC | PRN

## 2017-10-24 MED ORDER — ALBUMIN HUMAN 5 % IV SOLN
INTRAVENOUS | Status: DC | PRN
  Administered 2017-10-24: 08:00:00 via INTRAVENOUS

## 2017-10-24 MED ORDER — ACETAMINOPHEN 650 MG RE SUPP: 650.0000 mg | RECTAL | Status: DC | PRN

## 2017-10-24 MED ORDER — DIPHENHYDRAMINE HCL 12.5 MG/5ML PO ELIX
12.5000 mg | ORAL_SOLUTION | ORAL | Status: DC | PRN
  Administered 2017-10-25: 12.5 mg via ORAL
  Filled 2017-10-24: qty 5

## 2017-10-24 MED ORDER — PHENYLEPHRINE HCL 10 MG/ML IJ SOLN
INTRAMUSCULAR | Status: DC | PRN
  Administered 2017-10-24: 120 ug via INTRAVENOUS
  Administered 2017-10-24 (×2): 80 ug via INTRAVENOUS
  Administered 2017-10-24 (×2): 120 ug via INTRAVENOUS

## 2017-10-24 MED ORDER — RIVAROXABAN 10 MG PO TABS
10.0000 mg | ORAL_TABLET | Freq: Every day | ORAL | Status: DC
  Administered 2017-10-25: 10 mg via ORAL
  Filled 2017-10-24: qty 1

## 2017-10-24 MED ORDER — CHLORHEXIDINE GLUCONATE 4 % EX LIQD
60.0000 mL | Freq: Once | CUTANEOUS | Status: AC
  Administered 2017-10-24: 4 via TOPICAL

## 2017-10-24 MED ORDER — TRAMADOL HCL 50 MG PO TABS
50.0000 mg | ORAL_TABLET | Freq: Four times a day (QID) | ORAL | Status: DC | PRN
  Administered 2017-10-25: 50 mg via ORAL
  Filled 2017-10-24: qty 2

## 2017-10-24 MED ORDER — TRANEXAMIC ACID 1000 MG/10ML IV SOLN
INTRAVENOUS | Status: AC | PRN
  Administered 2017-10-24: 2000 mg via TOPICAL

## 2017-10-24 MED ORDER — PROPOFOL 500 MG/50ML IV EMUL
INTRAVENOUS | Status: DC | PRN
  Administered 2017-10-24: 25 ug/kg/min via INTRAVENOUS

## 2017-10-24 MED ORDER — POLYETHYLENE GLYCOL 3350 17 G PO PACK
17.0000 g | PACK | Freq: Every day | ORAL | Status: DC | PRN
  Administered 2017-10-26: 17 g via ORAL
  Filled 2017-10-24: qty 1

## 2017-10-24 MED ORDER — PHENOL 1.4 % MT LIQD
1.0000 | OROMUCOSAL | Status: DC | PRN
  Filled 2017-10-24: qty 177

## 2017-10-24 MED ORDER — METOCLOPRAMIDE HCL 5 MG/ML IJ SOLN: 5.0000 mg | Freq: Three times a day (TID) | INTRAMUSCULAR | Status: DC | PRN

## 2017-10-24 MED ORDER — DOCUSATE SODIUM 100 MG PO CAPS
100.0000 mg | ORAL_CAPSULE | Freq: Two times a day (BID) | ORAL | Status: DC
  Administered 2017-10-24 – 2017-10-26 (×4): 100 mg via ORAL
  Filled 2017-10-24 (×4): qty 1

## 2017-10-24 MED ORDER — PHENYLEPHRINE HCL 10 MG/ML IJ SOLN
INTRAMUSCULAR | Status: DC | PRN
  Administered 2017-10-24: 50 ug/min via INTRAVENOUS

## 2017-10-24 MED ORDER — OXYCODONE HCL 5 MG PO TABS
10.0000 mg | ORAL_TABLET | ORAL | Status: DC | PRN
  Administered 2017-10-24 – 2017-10-26 (×2): 10 mg via ORAL
  Filled 2017-10-24 (×2): qty 2

## 2017-10-24 MED ORDER — ONDANSETRON HCL 4 MG/2ML IJ SOLN
INTRAMUSCULAR | Status: DC | PRN
  Administered 2017-10-24: 4 mg via INTRAVENOUS

## 2017-10-24 MED ORDER — LEVOTHYROXINE SODIUM 112 MCG PO TABS
112.0000 ug | ORAL_TABLET | Freq: Every day | ORAL | Status: DC
  Administered 2017-10-25 – 2017-10-26 (×2): 112 ug via ORAL
  Filled 2017-10-24 (×2): qty 1

## 2017-10-24 MED ORDER — OXYCODONE HCL 5 MG PO TABS: 5.0000 mg | ORAL_TABLET | Freq: Once | ORAL | Status: DC | PRN

## 2017-10-24 MED ORDER — ONDANSETRON HCL 4 MG/2ML IJ SOLN
INTRAMUSCULAR | Status: AC
  Filled 2017-10-24: qty 2

## 2017-10-24 MED ORDER — ONDANSETRON HCL 4 MG PO TABS
4.0000 mg | ORAL_TABLET | Freq: Four times a day (QID) | ORAL | Status: DC | PRN
  Administered 2017-10-26: 4 mg via ORAL
  Filled 2017-10-24: qty 1

## 2017-10-24 MED ORDER — BISACODYL 10 MG RE SUPP: 10.0000 mg | Freq: Every day | RECTAL | Status: DC | PRN

## 2017-10-24 MED ORDER — DEXAMETHASONE SODIUM PHOSPHATE 10 MG/ML IJ SOLN
INTRAMUSCULAR | Status: DC | PRN
  Administered 2017-10-24: 10 mg via INTRAVENOUS

## 2017-10-24 MED ORDER — SODIUM CHLORIDE 0.9 % IR SOLN
Status: DC | PRN
  Administered 2017-10-24: 1000 mL

## 2017-10-24 MED ORDER — FENTANYL CITRATE (PF) 100 MCG/2ML IJ SOLN
INTRAMUSCULAR | Status: DC | PRN
  Administered 2017-10-24: 25 ug via INTRAVENOUS

## 2017-10-24 MED ORDER — STERILE WATER FOR IRRIGATION IR SOLN
Status: DC | PRN
  Administered 2017-10-24: 2000 mL

## 2017-10-24 MED ORDER — BUPIVACAINE LIPOSOME 1.3 % IJ SUSP
INTRAMUSCULAR | Status: DC | PRN
  Administered 2017-10-24: 20 mL

## 2017-10-24 MED ORDER — PHENYLEPHRINE HCL 10 MG/ML IJ SOLN: INTRAMUSCULAR | Status: DC | PRN

## 2017-10-24 MED ORDER — SODIUM CHLORIDE 0.9 % IV SOLN
INTRAVENOUS | Status: DC
  Administered 2017-10-24 – 2017-10-25 (×2): via INTRAVENOUS

## 2017-10-24 MED ORDER — SODIUM CHLORIDE 0.9 % IJ SOLN
INTRAMUSCULAR | Status: DC | PRN
  Administered 2017-10-24: 60 mL

## 2017-10-24 MED ORDER — METOCLOPRAMIDE HCL 5 MG PO TABS: 5.0000 mg | ORAL_TABLET | Freq: Three times a day (TID) | ORAL | Status: DC | PRN

## 2017-10-24 MED ORDER — DEXAMETHASONE SODIUM PHOSPHATE 10 MG/ML IJ SOLN
INTRAMUSCULAR | Status: AC
  Filled 2017-10-24: qty 1

## 2017-10-24 MED ORDER — ROPIVACAINE HCL 5 MG/ML IJ SOLN
INTRAMUSCULAR | Status: DC | PRN
  Administered 2017-10-24: 20 mL via PERINEURAL

## 2017-10-24 MED ORDER — MORPHINE SULFATE (PF) 2 MG/ML IV SOLN
1.0000 mg | INTRAVENOUS | Status: DC | PRN
  Administered 2017-10-24: 1 mg via INTRAVENOUS
  Filled 2017-10-24: qty 1

## 2017-10-24 MED ORDER — LACTATED RINGERS IV SOLN
INTRAVENOUS | Status: DC
  Administered 2017-10-24 (×2): via INTRAVENOUS

## 2017-10-24 MED ORDER — FLEET ENEMA 7-19 GM/118ML RE ENEM: 1.0000 | ENEMA | Freq: Once | RECTAL | Status: DC | PRN

## 2017-10-24 MED ORDER — MENTHOL 3 MG MT LOZG: 1.0000 | LOZENGE | OROMUCOSAL | Status: DC | PRN

## 2017-10-24 MED ORDER — METOPROLOL SUCCINATE ER 50 MG PO TB24
50.0000 mg | ORAL_TABLET | Freq: Every day | ORAL | Status: DC
  Administered 2017-10-25 – 2017-10-26 (×2): 50 mg via ORAL
  Filled 2017-10-24 (×3): qty 1

## 2017-10-24 MED ORDER — BUPIVACAINE IN DEXTROSE 0.75-8.25 % IT SOLN
INTRATHECAL | Status: DC | PRN
  Administered 2017-10-24: 1.5 mL via INTRATHECAL

## 2017-10-24 MED ORDER — SODIUM CHLORIDE 0.9 % IJ SOLN
INTRAMUSCULAR | Status: AC
  Filled 2017-10-24: qty 50

## 2017-10-24 MED ORDER — METHOCARBAMOL 1000 MG/10ML IJ SOLN
500.0000 mg | Freq: Four times a day (QID) | INTRAVENOUS | Status: DC | PRN
  Filled 2017-10-24: qty 5

## 2017-10-24 MED ORDER — PROPOFOL 10 MG/ML IV BOLUS
INTRAVENOUS | Status: AC
  Filled 2017-10-24: qty 40

## 2017-10-24 MED ORDER — FENTANYL CITRATE (PF) 100 MCG/2ML IJ SOLN
INTRAMUSCULAR | Status: AC
  Filled 2017-10-24: qty 2

## 2017-10-24 MED ORDER — OXYCODONE HCL 5 MG PO TABS
5.0000 mg | ORAL_TABLET | ORAL | Status: DC | PRN
  Administered 2017-10-24 – 2017-10-26 (×8): 5 mg via ORAL
  Filled 2017-10-24 (×9): qty 1

## 2017-10-24 MED ORDER — ACETAMINOPHEN 500 MG PO TABS
1000.0000 mg | ORAL_TABLET | Freq: Four times a day (QID) | ORAL | Status: AC
  Administered 2017-10-24 – 2017-10-25 (×4): 1000 mg via ORAL
  Filled 2017-10-24 (×4): qty 2

## 2017-10-24 MED ORDER — VANCOMYCIN HCL IN DEXTROSE 1-5 GM/200ML-% IV SOLN
1000.0000 mg | INTRAVENOUS | Status: AC
  Administered 2017-10-24: 1000 mg via INTRAVENOUS
  Filled 2017-10-24: qty 200

## 2017-10-24 MED ORDER — METHOCARBAMOL 500 MG PO TABS
500.0000 mg | ORAL_TABLET | Freq: Four times a day (QID) | ORAL | Status: DC | PRN
  Administered 2017-10-24 – 2017-10-26 (×3): 500 mg via ORAL
  Filled 2017-10-24 (×3): qty 1

## 2017-10-24 MED ORDER — TRAMADOL HCL 50 MG PO TABS: 50.0000 mg | ORAL_TABLET | Freq: Four times a day (QID) | ORAL | Status: DC | PRN

## 2017-10-24 MED ORDER — FENTANYL CITRATE (PF) 100 MCG/2ML IJ SOLN: 25.0000 ug | INTRAMUSCULAR | Status: DC | PRN

## 2017-10-24 MED ORDER — OXYCODONE HCL 5 MG/5ML PO SOLN
5.0000 mg | Freq: Once | ORAL | Status: DC | PRN
  Filled 2017-10-24: qty 5

## 2017-10-24 MED ORDER — SODIUM CHLORIDE 0.9 % IJ SOLN
INTRAMUSCULAR | Status: AC
  Filled 2017-10-24: qty 10

## 2017-10-24 MED ORDER — TRANEXAMIC ACID 1000 MG/10ML IV SOLN
1000.0000 mg | Freq: Once | INTRAVENOUS | Status: AC
  Administered 2017-10-24: 1000 mg via INTRAVENOUS
  Filled 2017-10-24: qty 10
  Filled 2017-10-24: qty 1100

## 2017-10-24 MED ORDER — VANCOMYCIN HCL IN DEXTROSE 1-5 GM/200ML-% IV SOLN
1000.0000 mg | Freq: Two times a day (BID) | INTRAVENOUS | Status: AC
  Administered 2017-10-24: 1000 mg via INTRAVENOUS
  Filled 2017-10-24: qty 200

## 2017-10-24 MED ORDER — ACETAMINOPHEN 10 MG/ML IV SOLN
1000.0000 mg | Freq: Once | INTRAVENOUS | Status: AC
  Administered 2017-10-24: 1000 mg via INTRAVENOUS
  Filled 2017-10-24: qty 100

## 2017-10-24 SURGICAL SUPPLY — 52 items
BAG DECANTER FOR FLEXI CONT (MISCELLANEOUS) ×2 IMPLANT
BAG SPEC THK2 15X12 ZIP CLS (MISCELLANEOUS) ×1
BAG ZIPLOCK 12X15 (MISCELLANEOUS) ×2 IMPLANT
BANDAGE ACE 6X5 VEL STRL LF (GAUZE/BANDAGES/DRESSINGS) ×2 IMPLANT
BLADE SAG 18X100X1.27 (BLADE) ×2 IMPLANT
BLADE SAW SGTL 11.0X1.19X90.0M (BLADE) ×2 IMPLANT
BOWL SMART MIX CTS (DISPOSABLE) ×2 IMPLANT
CAP KNEE TOTAL 3 SIGMA ×1 IMPLANT
CEMENT HV SMART SET (Cement) ×4 IMPLANT
COVER SURGICAL LIGHT HANDLE (MISCELLANEOUS) ×2 IMPLANT
CUFF TOURN SGL QUICK 34 (TOURNIQUET CUFF) ×2
CUFF TRNQT CYL 34X4X40X1 (TOURNIQUET CUFF) ×1 IMPLANT
DECANTER SPIKE VIAL GLASS SM (MISCELLANEOUS) ×2 IMPLANT
DRAPE U-SHAPE 47X51 STRL (DRAPES) ×2 IMPLANT
DRSG ADAPTIC 3X8 NADH LF (GAUZE/BANDAGES/DRESSINGS) ×2 IMPLANT
DRSG PAD ABDOMINAL 8X10 ST (GAUZE/BANDAGES/DRESSINGS) ×2 IMPLANT
DURAPREP 26ML APPLICATOR (WOUND CARE) ×2 IMPLANT
ELECT REM PT RETURN 15FT ADLT (MISCELLANEOUS) ×2 IMPLANT
EVACUATOR 1/8 PVC DRAIN (DRAIN) ×2 IMPLANT
GAUZE SPONGE 4X4 12PLY STRL (GAUZE/BANDAGES/DRESSINGS) ×2 IMPLANT
GLOVE BIO SURGEON STRL SZ7.5 (GLOVE) IMPLANT
GLOVE BIO SURGEON STRL SZ8 (GLOVE) ×2 IMPLANT
GLOVE BIOGEL PI IND STRL 6.5 (GLOVE) IMPLANT
GLOVE BIOGEL PI IND STRL 7.5 (GLOVE) IMPLANT
GLOVE BIOGEL PI IND STRL 8 (GLOVE) ×1 IMPLANT
GLOVE BIOGEL PI INDICATOR 6.5 (GLOVE) ×1
GLOVE BIOGEL PI INDICATOR 7.5 (GLOVE) ×4
GLOVE BIOGEL PI INDICATOR 8 (GLOVE) ×1
GLOVE SURG SS PI 6.5 STRL IVOR (GLOVE) ×1 IMPLANT
GOWN STRL REUS W/TWL LRG LVL3 (GOWN DISPOSABLE) ×5 IMPLANT
GOWN STRL REUS W/TWL XL LVL3 (GOWN DISPOSABLE) IMPLANT
HANDPIECE INTERPULSE COAX TIP (DISPOSABLE) ×2
IMMOBILIZER KNEE 20 (SOFTGOODS) ×2
IMMOBILIZER KNEE 20 THIGH 36 (SOFTGOODS) ×1 IMPLANT
MANIFOLD NEPTUNE II (INSTRUMENTS) ×2 IMPLANT
NS IRRIG 1000ML POUR BTL (IV SOLUTION) ×2 IMPLANT
PACK TOTAL KNEE CUSTOM (KITS) ×2 IMPLANT
PAD ABD 8X10 STRL (GAUZE/BANDAGES/DRESSINGS) ×1 IMPLANT
PADDING CAST COTTON 6X4 STRL (CAST SUPPLIES) ×4 IMPLANT
POSITIONER SURGICAL ARM (MISCELLANEOUS) ×2 IMPLANT
SET HNDPC FAN SPRY TIP SCT (DISPOSABLE) ×1 IMPLANT
STRIP CLOSURE SKIN 1/2X4 (GAUZE/BANDAGES/DRESSINGS) ×3 IMPLANT
SUT MNCRL AB 4-0 PS2 18 (SUTURE) ×2 IMPLANT
SUT STRATAFIX 0 PDS 27 VIOLET (SUTURE) ×2
SUT VIC AB 2-0 CT1 27 (SUTURE) ×6
SUT VIC AB 2-0 CT1 TAPERPNT 27 (SUTURE) ×3 IMPLANT
SUTURE STRATFX 0 PDS 27 VIOLET (SUTURE) ×1 IMPLANT
SYR 30ML LL (SYRINGE) ×4 IMPLANT
TRAY FOLEY CATH 14FR (SET/KITS/TRAYS/PACK) ×1 IMPLANT
WATER STERILE IRR 1000ML POUR (IV SOLUTION) ×4 IMPLANT
WRAP KNEE MAXI GEL POST OP (GAUZE/BANDAGES/DRESSINGS) ×2 IMPLANT
YANKAUER SUCT BULB TIP 10FT TU (MISCELLANEOUS) ×2 IMPLANT

## 2017-10-24 NOTE — Anesthesia Preprocedure Evaluation (Signed)
Anesthesia Evaluation  Patient identified by MRN, date of birth, ID band Patient awake    Reviewed: Allergy & Precautions, H&P , NPO status , Patient's Chart, lab work & pertinent test results  Airway Mallampati: II   Neck ROM: full    Dental   Pulmonary neg pulmonary ROS,    breath sounds clear to auscultation       Cardiovascular hypertension, + angina + CAD, + Past MI and + Cardiac Stents  + Valvular Problems/Murmurs  Rhythm:regular Rate:Normal  HOCM.  Cardiologist Aundra Dubin.  Mild-mod MR   Neuro/Psych    GI/Hepatic   Endo/Other  Hypothyroidism   Renal/GU      Musculoskeletal  (+) Arthritis ,   Abdominal   Peds  Hematology   Anesthesia Other Findings   Reproductive/Obstetrics H/o breast CA                             Anesthesia Physical Anesthesia Plan  ASA: III  Anesthesia Plan: Spinal   Post-op Pain Management:  Regional for Post-op pain   Induction: Intravenous  PONV Risk Score and Plan: 2 and Ondansetron, Dexamethasone, Propofol infusion and Treatment may vary due to age or medical condition  Airway Management Planned: Simple Face Mask  Additional Equipment:   Intra-op Plan:   Post-operative Plan:   Informed Consent: I have reviewed the patients History and Physical, chart, labs and discussed the procedure including the risks, benefits and alternatives for the proposed anesthesia with the patient or authorized representative who has indicated his/her understanding and acceptance.     Plan Discussed with: CRNA, Surgeon and Anesthesiologist  Anesthesia Plan Comments:         Anesthesia Quick Evaluation

## 2017-10-24 NOTE — Anesthesia Procedure Notes (Signed)
Procedure Name: MAC Date/Time: 10/24/2017 7:13 AM Performed by: West Pugh, CRNA Pre-anesthesia Checklist: Patient identified, Emergency Drugs available, Suction available, Patient being monitored and Timeout performed Patient Re-evaluated:Patient Re-evaluated prior to induction Oxygen Delivery Method: Nasal cannula and Simple face mask Placement Confirmation: CO2 detector and positive ETCO2 Dental Injury: Teeth and Oropharynx as per pre-operative assessment

## 2017-10-24 NOTE — Op Note (Signed)
OPERATIVE REPORT-TOTAL KNEE ARTHROPLASTY   Pre-operative diagnosis- Osteoarthritis  Right knee(s)  Post-operative diagnosis- Osteoarthritis Right knee(s)  Procedure-  Right  Total Knee Arthroplasty  Surgeon- Dione Plover. Bernis Stecher, MD  Assistant- Ardeen Jourdain, PA-C   Anesthesia-  Adductor canal block and spinal  EBL- 25 ml   Drains Hemovac  Tourniquet time-  Total Tourniquet Time Documented: Thigh (Right) - 31 minutes Total: Thigh (Right) - 31 minutes     Complications- None  Condition-PACU - hemodynamically stable.   Brief Clinical Note  Kim [name] is a 83 y.o. year old female with end stage OA of her right knee with progressively worsening pain and dysfunction. She has constant pain, with activity and at rest and significant functional deficits with difficulties even with ADLs. She has had extensive non-op management including analgesics, injections of cortisone and viscosupplements, and home exercise program, but remains in significant pain with significant dysfunction.Radiographs show bone on bone arthritis medial and patellofemoral. She presents now for right Total Knee Arthroplasty.    Procedure in detail---   The patient is brought into the operating room and positioned supine on the operating table. After successful administration of  Adductor canal block and spinal,   a tourniquet is placed high on the  Right thigh(s) and the lower extremity is prepped and draped in the usual sterile fashion. Time out is performed by the operating team and then the  Right lower extremity is wrapped in Esmarch, knee flexed and the tourniquet inflated to 300 mmHg.       A midline incision is made with a ten blade through the subcutaneous tissue to the level of the extensor mechanism. A fresh blade is used to make a medial parapatellar arthrotomy. Soft tissue over the proximal medial tibia is subperiosteally elevated to the joint line with a knife and into the semimembranosus bursa with  a Cobb elevator. Soft tissue over the proximal lateral tibia is elevated with attention being paid to avoiding the patellar tendon on the tibial tubercle. The patella is everted, knee flexed 90 degrees and the ACL and PCL are removed. Findings are bone on bone medial and patellofemoral with large global osteophytes.        The drill is used to create a starting hole in the distal femur and the canal is thoroughly irrigated with sterile saline to remove the fatty contents. The 5 degree Right  valgus alignment guide is placed into the femoral canal and the distal femoral cutting block is pinned to remove 10 mm off the distal femur. Resection is made with an oscillating saw.      The tibia is subluxed forward and the menisci are removed. The extramedullary alignment guide is placed referencing proximally at the medial aspect of the tibial tubercle and distally along the second metatarsal axis and tibial crest. The block is pinned to remove 55mm off the more deficient medial  side. Resection is made with an oscillating saw. Size 3is the most appropriate size for the tibia and the proximal tibia is prepared with the modular drill and keel punch for that size.      The femoral sizing guide is placed and size 2.5 is most appropriate. Rotation is marked off the epicondylar axis and confirmed by creating a rectangular flexion gap at 90 degrees. The size 2.5 cutting block is pinned in this rotation and the anterior, posterior and chamfer cuts are made with the oscillating saw. The intercondylar block is then placed and that cut is made.  Trial size 3 tibial component, trial size 2.5 posterior stabilized femur and a 12.5  mm posterior stabilized rotating platform insert trial is placed. Full extension is achieved with excellent varus/valgus and anterior/posterior balance throughout full range of motion. The patella is everted and thickness measured to be 22  mm. Free hand resection is taken to 12 mm, a 35 template is  placed, lug holes are drilled, trial patella is placed, and it tracks normally. Osteophytes are removed off the posterior femur with the trial in place. All trials are removed and the cut bone surfaces prepared with pulsatile lavage. Cement is mixed and once ready for implantation, the size 3 tibial implant, size  2.5 posterior stabilized femoral component, and the size 35 patella are cemented in place and the patella is held with the clamp. The trial insert is placed and the knee held in full extension. The Exparel (20 ml mixed with 60 ml saline) is injected into the extensor mechanism, posterior capsule, medial and lateral gutters and subcutaneous tissues.  All extruded cement is removed and once the cement is hard the permanent 12.5 mm posterior stabilized rotating platform insert is placed into the tibial tray.      The wound is copiously irrigated with saline solution and the extensor mechanism closed over a hemovac drain with #1 V-loc suture. The tourniquet is released for a total tourniquet time of 31  minutes. Flexion against gravity is 140 degrees and the patella tracks normally. Subcutaneous tissue is closed with 2.0 vicryl and subcuticular with running 4.0 Monocryl. The incision is cleaned and dried and steri-strips and a bulky sterile dressing are applied. The limb is placed into a knee immobilizer and the patient is awakened and transported to recovery in stable condition.      Please note that a surgical assistant was a medical necessity for this procedure in order to perform it in a safe and expeditious manner. Surgical assistant was necessary to retract the ligaments and vital neurovascular structures to prevent injury to them and also necessary for proper positioning of the limb to allow for anatomic placement of the prosthesis.   Dione Plover Jazzmin Newbold, MD    10/24/2017, 8:28 AM

## 2017-10-24 NOTE — Evaluation (Signed)
Physical Therapy Evaluation Patient Details Name: Kim [name] MRN: [id number] DOB: January 03, 1935 Today's Date: 10/24/2017   History of Present Illness  RTKA  Clinical Impression  The patient ambuated x 50'. Increased pain after return to bed. RN notified. Pt admitted with above diagnosis. Pt currently with functional limitations due to the deficits listed below (see PT Problem List).  Pt will benefit from skilled PT to increase their independence and safety with mobility to allow discharge to the venue listed below.       Follow Up Recommendations DC plan and follow up therapy as arranged by surgeon    Equipment Recommendations  None recommended by PT    Recommendations for Other Services       Precautions / Restrictions Precautions Precautions: Knee Required Braces or Orthoses: Knee Immobilizer - Right Knee Immobilizer - Right: Discontinue once straight leg raise with < 10 degree lag      Mobility  Bed Mobility Overal bed mobility: Needs Assistance Bed Mobility: Supine to Sit;Sit to Supine     Supine to sit: Min assist Sit to supine: Min assist   General bed mobility comments: cues for technique and support right leg  Transfers Overall transfer level: Needs assistance Equipment used: Rolling walker (2 wheeled) Transfers: Sit to/from Stand Sit to Stand: Min assist         General transfer comment: cues for hand and right leg position  Ambulation/Gait Ambulation/Gait assistance: Min assist Ambulation Distance (Feet): 50 Feet Assistive device: Rolling walker (2 wheeled) Gait Pattern/deviations: Step-to pattern;Step-through pattern;Decreased stance time - right;Antalgic     General Gait Details: cues for sequence  Stairs            Wheelchair Mobility    Modified Rankin (Stroke Patients Only)       Balance                                             Pertinent Vitals/Pain Pain Assessment: 0-10 Pain Score: 6  Pain Location:  right knee Pain Descriptors / Indicators: Aching;Throbbing Pain Intervention(s): Limited activity within patient's tolerance;Monitored during session;Ice applied;Patient requesting pain meds-RN notified;Premedicated before session    Home Living Family/patient expects to be discharged to:: Private residence Living Arrangements: Spouse/significant other Available Help at Discharge: Family Type of Home: House Home Access: [place]: One [place]: Environmental consultant - 2 wheels;Cane - single point      Prior Function Level of Independence: Independent               Hand Dominance        Extremity/Trunk Assessment        Lower Extremity Assessment Lower Extremity Assessment: RLE deficits/detail RLE Deficits / Details: performs SLR    Cervical / Trunk Assessment Cervical / Trunk Assessment: Normal  Communication   Communication: No difficulties  Cognition Arousal/Alertness: Awake/alert Behavior During Therapy: WFL for tasks assessed/performed Overall Cognitive Status: Within Functional Limits for tasks assessed                                        General Comments      Exercises     Assessment/Plan    PT Assessment Patient needs continued PT services  PT Problem List Decreased strength;Decreased range of  motion;Decreased activity tolerance;Decreased mobility;Decreased knowledge of precautions;Decreased safety awareness;Decreased knowledge of use of DME;Pain       PT Treatment Interventions DME instruction;Therapeutic exercise;Gait training;Stair training;Functional mobility training;Therapeutic activities    PT Goals (Current goals can be found in the Care Plan section)  Acute Rehab PT Goals Patient Stated Goal: to walk without pain, PT Goal Formulation: With patient Time For Goal Achievement: 10/28/17 Potential to Achieve Goals: Good    Frequency 7X/week   Barriers to discharge        Co-evaluation                AM-PAC PT "6 Clicks" Daily Activity  Outcome Measure Difficulty turning over in bed (including adjusting bedclothes, sheets and blankets)?: A Little Difficulty moving from lying on back to sitting on the side of the bed? : A Little Difficulty sitting down on and standing up from a chair with arms (e.g., wheelchair, bedside commode, etc,.)?: A Little Help needed moving to and from a bed to chair (including a wheelchair)?: A Little Help needed walking in hospital room?: A Lot Help needed climbing 3-5 steps with a railing? : A Lot 6 Click Score: 16    End of Session Equipment Utilized During Treatment: Right knee immobilizer Activity Tolerance: Patient tolerated treatment well Patient left: in bed;with call bell/phone within reach;with bed alarm set Nurse Communication: Mobility status PT Visit Diagnosis: Unsteadiness on feet (R26.81);Pain Pain - Right/Left: Right Pain - part of body: Knee    Time: 8309-4076 PT Time Calculation (min) (ACUTE ONLY): 28 min   Charges:   PT Evaluation $PT Eval Low Complexity: 1 Low PT Treatments $Gait Training: 8-22 mins   PT G CodesTresa Endo PT [phone]   [name] 10/24/2017, 5:52 PM

## 2017-10-24 NOTE — Anesthesia Procedure Notes (Signed)
Spinal  Patient location during procedure: OR Start time: 10/24/2017 7:16 AM End time: 10/24/2017 7:19 AM Staffing Anesthesiologist: Albertha Ghee, MD Performed: anesthesiologist  Preanesthetic Checklist Completed: patient identified, surgical consent, pre-op evaluation, timeout performed, IV checked, risks and benefits discussed and monitors and equipment checked Spinal Block Patient position: sitting Prep: DuraPrep Patient monitoring: cardiac monitor, continuous pulse ox and blood pressure Approach: midline Location: L3-4 Injection technique: single-shot Needle Needle type: Pencan  Needle gauge: 24 G Needle length: 9 cm Assessment Sensory level: T10 Additional Notes Functioning IV was confirmed and monitors were applied. Sterile prep and drape, including hand hygiene and sterile gloves were used. The patient was positioned and the spine was prepped. The skin was anesthetized with lidocaine.  Free flow of clear CSF was obtained prior to injecting local anesthetic into the CSF.  The spinal needle aspirated freely following injection.  The needle was carefully withdrawn.  The patient tolerated the procedure well.

## 2017-10-24 NOTE — Interval H&P Note (Signed)
History and Physical Interval Note:  10/24/2017 6:49 AM  Toma Deiters  has presented today for surgery, with the diagnosis of Osteoarthritis Right Knee  The various methods of treatment have been discussed with the patient and family. After consideration of risks, benefits and other options for treatment, the patient has consented to  Procedure(s): TOTAL RIGHT KNEE ARTHROPLASTY (Right) as a surgical intervention .  The patient's history has been reviewed, patient examined, no change in status, stable for surgery.  I have reviewed the patient's chart and labs.  Questions were answered to the patient's satisfaction.     Pilar Plate Mimi Debellis

## 2017-10-24 NOTE — Anesthesia Postprocedure Evaluation (Signed)
Anesthesia Post Note  Patient: JEANNENE TSCHETTER  Procedure(s) Performed: TOTAL RIGHT KNEE ARTHROPLASTY (Right Knee)     Patient location during evaluation: PACU Anesthesia Type: Spinal Level of consciousness: oriented and awake and alert Pain management: pain level controlled Vital Signs Assessment: post-procedure vital signs reviewed and stable Respiratory status: spontaneous breathing, respiratory function stable and patient connected to nasal cannula oxygen Cardiovascular status: blood pressure returned to baseline and stable Postop Assessment: no headache, no backache and no apparent nausea or vomiting Anesthetic complications: no    Last Vitals:  Vitals:   10/24/17 1113 10/24/17 1213  BP: 123/64 (!) 122/56  Pulse: (!) 58 70  Resp: 16 16  Temp: 36.7 C 36.9 C  SpO2: 100% 96%    Last Pain:  Vitals:   10/24/17 1213  TempSrc: Oral  PainSc:                  Clyde S

## 2017-10-24 NOTE — Progress Notes (Signed)
Dr. Marcie Bal made aware of patient's blood pressures- heart rates- "blood pressures O.K."- no new orders received

## 2017-10-24 NOTE — Anesthesia Procedure Notes (Signed)
Anesthesia Regional Block: Adductor canal block   Pre-Anesthetic Checklist: ,, timeout performed, Correct Patient, Correct Site, Correct Laterality, Correct Procedure, Correct Position, site marked, Risks and benefits discussed,  Surgical consent,  Pre-op evaluation,  At surgeon's request and post-op pain management  Laterality: Right  Prep: chloraprep       Needles:  Injection technique: Single-shot  Needle Type: Echogenic Needle     Needle Length: 9cm  Needle Gauge: 21     Additional Needles:   Narrative:  Start time: 10/24/2017 6:50 AM End time: 10/24/2017 6:59 AM Injection made incrementally with aspirations every 5 mL.  Performed by: Personally  Anesthesiologist: Albertha Ghee, MD  Additional Notes: Pt tolerated the procedure well.

## 2017-10-24 NOTE — Transfer of Care (Signed)
Immediate Anesthesia Transfer of Care Note  Patient: Kim [name]  Procedure(s) Performed: TOTAL RIGHT KNEE ARTHROPLASTY (Right Knee)  Patient Location: PACU  Anesthesia Type:Spinal and MAC combined with regional for post-op pain  Level of Consciousness: awake, alert , oriented and patient cooperative  Airway & Oxygen Therapy: Patient Spontanous Breathing and Patient connected to face mask oxygen  Post-op Assessment: Report given to RN and Post -op Vital signs reviewed and stable  Post vital signs: Reviewed and stable  Last Vitals:  Vitals:   10/24/17 0549  BP: 130/80  Pulse: 60  Resp: 16  Temp: 36.7 C  SpO2: 99%    Last Pain:  Vitals:   10/24/17 0549  TempSrc: Oral         Complications: No apparent anesthesia complications

## 2017-10-25 LAB — CBC
HCT: 29.8 % — ABNORMAL LOW (ref 36.0–46.0)
Hemoglobin: 10.2 g/dL — ABNORMAL LOW (ref 12.0–15.0)
MCH: 32.6 pg (ref 26.0–34.0)
MCHC: 34.2 g/dL (ref 30.0–36.0)
MCV: 95.2 fL (ref 78.0–100.0)
Platelets: 98 10*3/uL — ABNORMAL LOW (ref 150–400)
RBC: 3.13 MIL/uL — ABNORMAL LOW (ref 3.87–5.11)
RDW: 13.1 % (ref 11.5–15.5)
WBC: 9.6 10*3/uL (ref 4.0–10.5)

## 2017-10-25 LAB — BASIC METABOLIC PANEL
Anion gap: 4 — ABNORMAL LOW (ref 5–15)
BUN: 20 mg/dL (ref 6–20)
CO2: 25 mmol/L (ref 22–32)
Calcium: 8.4 mg/dL — ABNORMAL LOW (ref 8.9–10.3)
Chloride: 107 mmol/L (ref 101–111)
Creatinine, Ser: 0.9 mg/dL (ref 0.44–1.00)
GFR calc Af Amer: >60 mL/min (ref >60)
GFR calc non Af Amer: 59 mL/min — ABNORMAL LOW (ref >60)
Glucose, Bld: 126 mg/dL — ABNORMAL HIGH (ref 65–99)
Potassium: 4.1 mmol/L (ref 3.5–5.1)
Sodium: 136 mmol/L (ref 135–145)

## 2017-10-25 MED ORDER — METHOCARBAMOL 500 MG PO TABS: 500.0000 mg | ORAL_TABLET | Freq: Four times a day (QID) | ORAL | 0 refills | Status: DC | PRN

## 2017-10-25 MED ORDER — TRAMADOL HCL 50 MG PO TABS: 50.0000 mg | ORAL_TABLET | Freq: Four times a day (QID) | ORAL | 0 refills | Status: DC | PRN

## 2017-10-25 MED ORDER — OXYCODONE HCL 5 MG PO TABS: 5.0000 mg | ORAL_TABLET | ORAL | 0 refills | Status: DC | PRN

## 2017-10-25 MED ORDER — SODIUM CHLORIDE 0.9 % IV BOLUS (SEPSIS)
250.0000 mL | Freq: Once | INTRAVENOUS | Status: AC
  Administered 2017-10-25: 250 mL via INTRAVENOUS

## 2017-10-25 MED ORDER — APIXABAN 2.5 MG PO TABS
2.5000 mg | ORAL_TABLET | Freq: Two times a day (BID) | ORAL | Status: DC
  Administered 2017-10-26: 2.5 mg via ORAL
  Filled 2017-10-25: qty 1

## 2017-10-25 MED ORDER — APIXABAN 2.5 MG PO TABS: 2.5000 mg | ORAL_TABLET | Freq: Two times a day (BID) | ORAL | 0 refills | Status: DC

## 2017-10-25 NOTE — Progress Notes (Signed)
Physical Therapy Treatment Patient Details Name: Kim [name] MRN: [id number] DOB: 1934-10-24 Today's Date: 10/25/2017    History of Present Illness RTKA    PT Comments    Patient had low BP, received bolus, BP 114/60. No dizziness. Continue PT    Follow Up Recommendations  DC plan and follow up therapy as arranged by surgeon     Equipment Recommendations  None recommended by PT    Recommendations for Other Services OT consult     Precautions / Restrictions Precautions Precautions: Knee Precaution Comments: did not wear KI Required Braces or Orthoses: Knee Immobilizer - Right Knee Immobilizer - Right: Discontinue once straight leg raise with < 10 degree lag Restrictions Weight Bearing Restrictions: No    Mobility  Bed Mobility               General bed mobility comments: oob  Transfers Overall transfer level: Needs assistance Equipment used: Rolling walker (2 wheeled) Transfers: Sit to/from Stand Sit to Stand: Min assist         General transfer comment: cues for UE/LE placement  Ambulation/Gait Ambulation/Gait assistance: Min assist Ambulation Distance (Feet): 10 Feet Assistive device: Rolling walker (2 wheeled) Gait Pattern/deviations: Step-to pattern;Step-through pattern;Antalgic     General Gait Details: cues for sequence, BP 114/60 prior, no dizziness   Stairs            Wheelchair Mobility    Modified Rankin (Stroke Patients Only)       Balance                                            Cognition Arousal/Alertness: Awake/alert Behavior During Therapy: Impulsive Overall Cognitive Status: Within Functional Limits for tasks assessed                                 General Comments: pt is used to being very independent. Very HOH      Exercises      General Comments        Pertinent Vitals/Pain Pain Score: 3  Pain Location: right knee Pain Descriptors / Indicators: Tender;Sore Pain  Intervention(s): Premedicated before session;Ice applied;Monitored during session    [place] expects to be discharged to:: Private residence Living Arrangements: Spouse/significant other Available Help at Discharge: [place]: Gilford Rile - 2 wheels;Cane - single point Additional Comments: sink next to commode.  Has a shower seat    Prior Function Level of Independence: Independent          PT Goals (current goals can now be found in the care plan section) Progress towards PT goals: Progressing toward goals    Frequency    7X/week      PT Plan Current plan remains appropriate    Co-evaluation              AM-PAC PT "6 Clicks" Daily Activity  Outcome Measure  Difficulty turning over in bed (including adjusting bedclothes, sheets and blankets)?: A Little Difficulty moving from lying on back to sitting on the side of the bed? : A Little Difficulty sitting down on and standing up from a chair with arms (e.g., wheelchair, bedside commode, etc,.)?: A Little Help needed moving to and from a bed to chair (including a wheelchair)?: A Little Help  needed walking in hospital room?: A Little Help needed climbing 3-5 steps with a railing? : A Lot 6 Click Score: 17    End of Session   Activity Tolerance: Patient tolerated treatment well Patient left: (in BR w/ OT) Nurse Communication: Mobility status PT Visit Diagnosis: Unsteadiness on feet (R26.81);Pain Pain - Right/Left: Right Pain - part of body: Knee     Time: 1100-1113 PT Time Calculation (min) (ACUTE ONLY): 13 min  Charges:  $Gait Training: 8-22 mins                    G CodesTresa Endo PT [phone]    [name] 10/25/2017, 11:50 AM

## 2017-10-25 NOTE — Progress Notes (Signed)
Physical Therapy Treatment Patient Details Name: Kim [name] MRN: [id number] DOB: 08-30-1935 Today's Date: 10/25/2017    History of Present Illness RTKA    PT Comments    The patient is progressing well, works through the pain. Plans Dc tomorrow.            Follow Up Recommendations  DC plan and follow up therapy as arranged by surgeon     Equipment Recommendations  None recommended by PT    Recommendations for Other Services       Precautions / Restrictions Precautions Precautions: Knee Precaution Comments: did not wear KI    Mobility  Bed Mobility   Bed Mobility: Supine to Sit;Sit to Supine     Supine to sit: Min guard Sit to supine: Min guard   General bed mobility comments: no assistance required.  Transfers Overall transfer level: Needs assistance Equipment used: Rolling walker (2 wheeled) Transfers: Sit to/from Stand Sit to Stand: Min guard         General transfer comment: cues for hand and right leg position  Ambulation/Gait Ambulation/Gait assistance: Min assist Ambulation Distance (Feet): 90 Feet   Gait Pattern/deviations: Step-through pattern     General Gait Details: smoothe gait, no KI.    Stairs            Wheelchair Mobility    Modified Rankin (Stroke Patients Only)       Balance                                            Cognition Arousal/Alertness: Awake/alert                                            Exercises      General Comments        Pertinent Vitals/Pain Pain Location: right knee Pain Descriptors / Indicators: Tender;Sore Pain Intervention(s): Limited activity within patient's tolerance;Monitored during session;Premedicated before session;RN gave pain meds during session;Ice applied    Home Living                      Prior Function            PT Goals (current goals can now be found in the care plan section) Progress towards PT goals:  Progressing toward goals    Frequency    7X/week      PT Plan Current plan remains appropriate    Co-evaluation              AM-PAC PT "6 Clicks" Daily Activity  Outcome Measure  Difficulty turning over in bed (including adjusting bedclothes, sheets and blankets)?: A Little Difficulty moving from lying on back to sitting on the side of the bed? : A Little Difficulty sitting down on and standing up from a chair with arms (e.g., wheelchair, bedside commode, etc,.)?: A Little Help needed moving to and from a bed to chair (including a wheelchair)?: A Little Help needed walking in hospital room?: A Little Help needed climbing 3-5 steps with a railing? : A Lot 6 Click Score: 17    End of Session   Activity Tolerance: Patient tolerated treatment well Patient left: in bed;with bed alarm set;with family/visitor present Nurse Communication: Mobility status PT Visit Diagnosis: Unsteadiness  on feet (R26.81);Pain Pain - Right/Left: Right Pain - part of body: Knee     Time: 1840-3754 PT Time Calculation (min) (ACUTE ONLY): 21 min  Charges:  $Gait Training: 8-22 mins                    G CodesTresa Endo PT [phone]    [name] 10/25/2017, 4:19 PM

## 2017-10-25 NOTE — Progress Notes (Signed)
Patients blood pressure is 84/44. Kim [name] aware. 222mL bolus ordered and started. Will recheck blood pressure in 1 hour and give metoprolol per Kim [name]. Patient stated she takes Metoprolol 50mg  ER once a day.

## 2017-10-25 NOTE — Discharge Instructions (Addendum)
° °Dr. Frank Aluisio °Total Joint Specialist °[facility] °[address] °[place], [place] [address] °[phone] ° °TOTAL KNEE REPLACEMENT POSTOPERATIVE DIRECTIONS ° °Knee Rehabilitation, Guidelines Following Surgery  °Results after knee surgery are often greatly improved when you follow the exercise, range of motion and muscle strengthening exercises prescribed by your doctor. Safety measures are also important to protect the knee from further injury. Any time any of these exercises cause you to have increased pain or swelling in your knee joint, decrease the amount until you are comfortable again and slowly increase them. If you have problems or questions, call your caregiver or physical therapist for advice.  ° °HOME CARE INSTRUCTIONS  °Remove items at home which could result in a fall. This includes throw rugs or furniture in walking pathways.  °· ICE to the affected knee every three hours for 30 minutes at a time and then as needed for pain and swelling.  Continue to use ice on the knee for pain and swelling from surgery. You may notice swelling that will progress down to the foot and ankle.  This is normal after surgery.  Elevate the leg when you are not up walking on it.   °· Continue to use the breathing machine which will help keep your temperature down.  It is common for your temperature to cycle up and down following surgery, especially at night when you are not up moving around and exerting yourself.  The breathing machine keeps your lungs expanded and your temperature down. °· Do not place pillow under knee, focus on keeping the knee straight while resting ° °DIET °You may resume your previous home diet once your are discharged from the hospital. ° °DRESSING / WOUND CARE / SHOWERING °You may shower 3 days after surgery, but keep the wounds dry during showering.  You may use an occlusive plastic wrap (Press'n Seal for example), NO SOAKING/SUBMERGING IN THE BATHTUB.  If the  bandage gets wet, change with a clean dry gauze.  If the incision gets wet, pat the wound dry with a clean towel. °You may start showering once you are discharged home but do not submerge the incision under water. Just pat the incision dry and apply a dry gauze dressing on daily. °Change the surgical dressing daily and reapply a dry dressing each time. ° °ACTIVITY °Walk with your walker as instructed. °Use walker as long as suggested by your caregivers. °Avoid periods of inactivity such as sitting longer than an hour when not asleep. This helps prevent blood clots.  °You may resume a sexual relationship in one month or when given the OK by your doctor.  °You may return to work once you are cleared by your doctor.  °Do not drive a car for 6 weeks or until released by you surgeon.  °Do not drive while taking narcotics. ° °WEIGHT BEARING °Weight bearing as tolerated with assist device (walker, cane, etc) as directed, use it as long as suggested by your surgeon or therapist, typically at least 4-6 weeks. ° °POSTOPERATIVE CONSTIPATION PROTOCOL °Constipation - defined medically as fewer than three stools per week and severe constipation as less than one stool per week. ° °One of the most common issues patients have following surgery is constipation.  Even if you have a regular bowel pattern at home, your normal regimen is likely to be disrupted due to multiple reasons following surgery.  Combination of anesthesia, postoperative narcotics, change in appetite and fluid intake all can affect your bowels.    In order to avoid complications following surgery, here are some recommendations in order to help you during your recovery period. ° °Colace (docusate) - Pick up an over-the-counter form of Colace or another stool softener and take twice a day as long as you are requiring postoperative pain medications.  Take with a full glass of water daily.  If you experience loose stools or diarrhea, hold the colace until you stool forms  back up.  If your symptoms do not get better within 1 week or if they get worse, check with your doctor. ° °Dulcolax (bisacodyl) - Pick up over-the-counter and take as directed by the product packaging as needed to assist with the movement of your bowels.  Take with a full glass of water.  Use this product as needed if not relieved by Colace only.  ° °MiraLax (polyethylene glycol) - Pick up over-the-counter to have on hand.  MiraLax is a solution that will increase the amount of water in your bowels to assist with bowel movements.  Take as directed and can mix with a glass of water, juice, soda, coffee, or tea.  Take if you go more than two days without a movement. °Do not use MiraLax more than once per day. Call your doctor if you are still constipated or irregular after using this medication for 7 days in a row. ° °If you continue to have problems with postoperative constipation, please contact the office for further assistance and recommendations.  If you experience "the worst abdominal pain ever" or develop nausea or vomiting, please contact the office immediatly for further recommendations for treatment. ° °ITCHING ° If you experience itching with your medications, try taking only a single pain pill, or even half a pain pill at a time.  You can also use Benadryl over the counter for itching or also to help with sleep.  ° °TED HOSE STOCKINGS °Wear the elastic stockings on both legs for three weeks following surgery during the day but you may remove then at night for sleeping. ° °MEDICATIONS °See your medication summary on the “After Visit Summary” that the nursing staff will review with you prior to discharge.  You may have some home medications which will be placed on hold until you complete the course of blood thinner medication.  It is important for you to complete the blood thinner medication as prescribed by your surgeon.  Continue your approved medications as instructed at time of  discharge. ° °PRECAUTIONS °If you experience chest pain or shortness of breath - call 911 immediately for transfer to the hospital emergency department.  °If you develop a fever greater that 101 F, purulent drainage from wound, increased redness or drainage from wound, foul odor from the wound/dressing, or calf pain - CONTACT YOUR SURGEON.   °                                                °FOLLOW-UP APPOINTMENTS °Make sure you keep all of your appointments after your operation with your surgeon and caregivers. You should call the office at the above phone number and make an appointment for approximately two weeks after the date of your surgery or on the date instructed by your surgeon outlined in the "After Visit Summary". ° ° °RANGE OF MOTION AND STRENGTHENING EXERCISES  °Rehabilitation of the knee is important following a knee injury or   an operation. After just a few days of immobilization, the muscles of the thigh which control the knee become weakened and shrink (atrophy). Knee exercises are designed to build up the tone and strength of the thigh muscles and to improve knee motion. Often times heat used for twenty to thirty minutes before working out will loosen up your tissues and help with improving the range of motion but do not use heat for the first two weeks following surgery. These exercises can be done on a training (exercise) mat, on the floor, on a table or on a bed. Use what ever works the best and is most comfortable for you Knee exercises include:  Leg Lifts - While your knee is still immobilized in a splint or cast, you can do straight leg raises. Lift the leg to 60 degrees, hold for 3 sec, and slowly lower the leg. Repeat 10-20 times 2-3 times daily. Perform this exercise against resistance later as your knee gets better.  Quad and Hamstring Sets - Tighten up the muscle on the front of the thigh (Quad) and hold for 5-10 sec. Repeat this 10-20 times hourly. Hamstring sets are done by pushing the  foot backward against an object and holding for 5-10 sec. Repeat as with quad sets.   Leg Slides: Lying on your back, slowly slide your foot toward your buttocks, bending your knee up off the floor (only go as far as is comfortable). Then slowly slide your foot back down until your leg is flat on the floor again.  Angel Wings: Lying on your back spread your legs to the side as far apart as you can without causing discomfort.  A rehabilitation program following serious knee injuries can speed recovery and prevent re-injury in the future due to weakened muscles. Contact your doctor or a physical therapist for more information on knee rehabilitation.   IF YOU ARE TRANSFERRED TO A SKILLED REHAB FACILITY If the patient is transferred to a skilled rehab facility following release from the hospital, a list of the current medications will be sent to the facility for the patient to continue.  When discharged from the skilled rehab facility, please have the facility set up the patient's [place] prior to being released. Also, the skilled facility will be responsible for providing the patient with their medications at time of release from the facility to include their pain medication, the muscle relaxants, and their blood thinner medication. If the patient is still at the rehab facility at time of the two week follow up appointment, the skilled rehab facility will also need to assist the patient in arranging follow up appointment in our office and any transportation needs.  MAKE SURE YOU:  Understand these instructions.  Get help right away if you are not doing well or get worse.    Pick up stool softner and laxative for home use following surgery while on pain medications. Do not submerge incision under water. Please use good hand washing techniques while changing dressing each day. May shower starting three days after surgery. Please use a clean towel to pat the incision dry following  showers. Continue to use ice for pain and swelling after surgery. Do not use any lotions or creams on the incision until instructed by your surgeon.  Take Eliquis twice a day for two and a half more weeks following discharge from the hospital, then discontinue the Eliquis. Once the patient has completed the Eliquis, they may resume the 81 mg Aspirin.  Information on my medicine - XARELTO (Rivaroxaban)  Why was Xarelto prescribed for you? Xarelto was prescribed for you to reduce the risk of blood clots forming after orthopedic surgery. The medical term for these abnormal blood clots is venous thromboembolism (VTE).  What do you need to know about xarelto ? Take your Xarelto ONCE DAILY at the same time every day. You may take it either with or without food.  If you have difficulty swallowing the tablet whole, you may crush it and mix in applesauce just prior to taking your dose.  Take Xarelto exactly as prescribed by your doctor and DO NOT stop taking Xarelto without talking to the doctor who prescribed the medication.  Stopping without other VTE prevention medication to take the place of Xarelto may increase your risk of developing a clot.  After discharge, you should have regular check-up appointments with your healthcare provider that is prescribing your Xarelto.    What do you do if you miss a dose? If you miss a dose, take it as soon as you remember on the same day then continue your regularly scheduled once daily regimen the next day. Do not take two doses of Xarelto on the same day.   Important Safety Information A possible side effect of Xarelto is bleeding. You should call your healthcare provider right away if you experience any of the following: ? Bleeding from an injury or your nose that does not stop. ? Unusual colored urine (red or dark brown) or unusual colored stools (red or black). ? Unusual bruising for unknown reasons. ? A serious fall or if you hit your head  (even if there is no bleeding).  Some medicines may interact with Xarelto and might increase your risk of bleeding while on Xarelto. To help avoid this, consult your healthcare provider or pharmacist prior to using any new prescription or non-prescription medications, including herbals, vitamins, non-steroidal anti-inflammatory drugs (NSAIDs) and supplements.  This website has more information on Xarelto: [email or web address]

## 2017-10-25 NOTE — Progress Notes (Signed)
Physical Therapy Treatment Patient Details Name: Kim [name] MRN: [id number] DOB: [birth year] Today's Date: 10/25/2017    History of Present Illness RTKA    PT Comments    Patient concerned for cheelks flushed. RN aware. Progressing with ambulation.   Follow Up Recommendations  DC plan and follow up therapy as arranged by surgeon     Equipment Recommendations  None recommended by PT    Recommendations for Other Services OT consult     Precautions / Restrictions Precautions Precautions: Knee Precaution Comments: did not wear KI Required Braces or Orthoses: Knee Immobilizer - Right Knee Immobilizer - Right: Discontinue once straight leg raise with < 10 degree lag Restrictions Weight Bearing Restrictions: No    Mobility  Bed Mobility Overal bed mobility: Needs Assistance Bed Mobility: Sit to Supine       Sit to supine: Min assist   General bed mobility comments: with right leg.  Transfers Overall transfer level: Needs assistance Equipment used: Rolling walker (2 wheeled) Transfers: Sit to/from Stand Sit to Stand: Min assist         General transfer comment: standing with OT  Ambulation/Gait Ambulation/Gait assistance: Min assist Ambulation Distance (Feet): 90 Feet Assistive device: Rolling walker (2 wheeled) Gait Pattern/deviations: Step-through pattern     General Gait Details: smoothe gait, no KI. BP 128/60 prior to ambulation   Stairs            Wheelchair Mobility    Modified Rankin (Stroke Patients Only)       Balance                                            Cognition Arousal/Alertness: Awake/alert Behavior During Therapy: Impulsive Overall Cognitive Status: Within Functional Limits for tasks assessed                                 General Comments: pt is used to being very independent. Very HOH      Exercises      General Comments        Pertinent Vitals/Pain Pain Score: 4  Pain  Location: right knee Pain Descriptors / Indicators: Tender;Sore Pain Intervention(s): Limited activity within patient's tolerance;Monitored during session;Premedicated before session;Ice applied    Home Living Family/patient expects to be discharged to:: Private residence Living Arrangements: Spouse/significant other Available Help at Discharge: Family         Home Equipment: Gilford Rile - 2 wheels;Cane - single point Additional Comments: sink next to commode.  Has a shower seat    Prior Function Level of Independence: Independent          PT Goals (current goals can now be found in the care plan section) Progress towards PT goals: Progressing toward goals    Frequency    7X/week      PT Plan Current plan remains appropriate    Co-evaluation              AM-PAC PT "6 Clicks" Daily Activity  Outcome Measure  Difficulty turning over in bed (including adjusting bedclothes, sheets and blankets)?: A Little Difficulty moving from lying on back to sitting on the side of the bed? : A Little Difficulty sitting down on and standing up from a chair with arms (e.g., wheelchair, bedside commode, etc,.)?: A Little Help needed moving to and  from a bed to chair (including a wheelchair)?: A Little Help needed walking in hospital room?: A Little Help needed climbing 3-5 steps with a railing? : A Lot 6 Click Score: 14    End of Session   Activity Tolerance: Patient tolerated treatment well Patient left: in bed;with call bell/phone within reach;with bed alarm set;with family/visitor present Nurse Communication: Mobility status PT Visit Diagnosis: Unsteadiness on feet (R26.81);Pain Pain - Right/Left: Right Pain - part of body: Knee     Time: 8341-9622 PT Time Calculation (min) (ACUTE ONLY): 10 min  Charges:  $Gait Training: 8-22 mins                    G CodesTresa Endo PT [phone]    [name] 10/25/2017, 11:54 AM

## 2017-10-25 NOTE — Evaluation (Signed)
Occupational Therapy Evaluation Patient Details Name: Kim [name] MRN: [id number] DOB: 07/02/1935 Today's Date: 10/25/2017    History of Present Illness RTKA   Clinical Impression   This 83 year old female was admitted for the above sx. She is very independent natured and a little impulsive.  She will benefit from continued OT to increase safety and independence with adls.      Follow Up Recommendations  Supervision/Assistance - 24 hour    Equipment Recommendations  (to be further assessed) Pt has a high toilet with a sink next to it   Recommendations for Other Services       Precautions / Restrictions Precautions Precautions: Knee Required Braces or Orthoses: Knee Immobilizer - Right Knee Immobilizer - Right: Discontinue once straight leg raise with < 10 degree lag Restrictions Weight Bearing Restrictions: No      Mobility Bed Mobility               General bed mobility comments: oob  Transfers   Equipment used: Rolling walker (2 wheeled)   Sit to Stand: Min assist         General transfer comment: cues for UE/LE placement    Balance                                           ADL either performed or assessed with clinical judgement   ADL Overall ADL's : Needs assistance/impaired             Lower Body Bathing: Minimal assistance;Sit to/from stand       Lower Body Dressing: Minimal assistance;Sit to/from stand                 General ADL Comments: pt can perform UB adls with set up.  Assisted her with mesh panties and pad as she has urgency. Min A for balance:  Encouraged using one hand at a time Educated on precautions and safety.  Pt's bathroom is very close. She usually furniture walks in. Educated that she will have to use walker and demonstrated sidestepping if space is tight.  Will return when pt has pain medication     Vision         Perception     Praxis      Pertinent Vitals/Pain Pain Score: 5   Pain Location: right knee Pain Descriptors / Indicators: Aching Pain Intervention(s): Limited activity within patient's tolerance;Monitored during session;Repositioned;Ice applied     Hand Dominance     Extremity/Trunk Assessment Upper Extremity Assessment Upper Extremity Assessment: Overall WFL for tasks assessed           Communication Communication Communication: HOH   Cognition Arousal/Alertness: Awake/alert Behavior During Therapy: Impulsive Overall Cognitive Status: Within Functional Limits for tasks assessed                                 General Comments: pt is used to being very independent. Very HOH   General Comments       Exercises     Shoulder Instructions      Home Living Family/patient expects to be discharged to:: Private residence Living Arrangements: Spouse/significant other Available Help at Discharge: Family               Bathroom Shower/Tub: Walk-in [employer] treasurer Toilet: Handicapped height  Home Equipment: [place] - 2 wheels;Cane - single point   Additional Comments: sink next to commode.  Has a shower seat      Prior Functioning/Environment Level of Independence: Independent                 OT Problem List: Decreased activity tolerance;Decreased strength;Decreased knowledge of use of DME or AE;Increased edema      OT Treatment/Interventions: Self-care/ADL training;DME and/or AE instruction;Patient/family education    OT Goals(Current goals can be found in the care plan section) ADL Goals Pt Will Transfer to Toilet: with min guard assist;ambulating Pt Will Perform Toileting - Clothing Manipulation and hygiene: with min guard assist;sit to/from stand Pt Will Perform Tub/Shower Transfer: Shower transfer;with min guard assist;ambulating;shower seat Additional ADL Goal #1: pt will not need any safety cues during adls/bathroom transfers after instruction  OT Frequency: Min 2X/week   Barriers to D/C:             Co-evaluation              AM-PAC PT "6 Clicks" Daily Activity     Outcome Measure Help from another person eating meals?: None Help from another person taking care of personal grooming?: None Help from another person toileting, which includes using toliet, bedpan, or urinal?: A Little Help from another person bathing (including washing, rinsing, drying)?: A Little Help from another person to put on and taking off regular upper body clothing?: A Little Help from another person to put on and taking off regular lower body clothing?: A Lot 6 Click Score: 19   End of Session    Activity Tolerance: Patient tolerated treatment well Patient left: in chair;with call bell/phone within reach;with chair alarm set;with family/visitor present  OT Visit Diagnosis: Pain Pain - Right/Left: Right Pain - part of body: Knee                Time: 9983-3825 OT Time Calculation (min): 17 min Charges:  OT General Charges $OT Visit: 1 Visit OT Evaluation $OT Eval Low Complexity: 1 Low G-Codes:     Kim [name], OTR/L [phone] 10/25/2017  Kim [name] 10/25/2017, 10:26 AM

## 2017-10-25 NOTE — Progress Notes (Signed)
Spoke with patient at bedside. Confirmed plan for OP PT, already arranged. Has RW and 3n1. [phone] 

## 2017-10-25 NOTE — Discharge Summary (Signed)
Physician Discharge Summary   Patient ID: Kim [name] MRN: [id number] DOB/AGE: 1935-04-12 83 y.o.  Admit date: 10/24/2017 Discharge date: 10-26-2017  Primary Diagnosis:  Osteoarthritis Right knee(s)   Admission Diagnoses:  Past Medical History:  Diagnosis Date  . Allergic rhinitis   . Allergy   . Arthritis    hands  . Breast cancer of upper-outer quadrant of left female breast ([facility]) 11/08/13   finished chemo and radiation 2015  . CAD (coronary artery disease)    a. 12/2013 Cath/PCI: LM nl, LAD 20p, 85m 30d, D1 30p, LCX 20p, 329mOM1 20, Mo2 40p, RCA 30p, 9956m.[id number] Promus Premier DES), 20d, RPL 30, RPDA 70p, 82m3m 65-70%.  . Diverticulosis   . HOH (hard of hearing)    bilateral, wears hearing aids  . Hx of adenomatous colonic polyps 02/1999  . Hyperlipidemia   . Hypertension   . Hypothyroidism   . Iron deficiency anemia due to chronic blood loss 01/07/2016  . Low back pain    gets ESI from Dr. Max Rennis HardingMalabsorption of iron 01/07/2016  . Myocardial infarction ([facility])[place]2015   very mild-with first chemo -placed stent x1  . S/P radiation therapy 05/14/2014-06/26/2014   1) Left Breast  / 50 Gy in 25 fractions, 2) Left Supraclavicular fossa/ 47.5 Gy in 25 fractions, 3) Left Posterior Axillary boost / 4.825 Gy in 25 fractions, 4) Left Breast boost / 10 Gy in 5 fractions   . SVD (spontaneous vaginal delivery)    x 2  . Tubulovillous adenoma of colon 12/2009   with HGD  . Urinary tract infection   . Wears hearing aid    left and right    Discharge Diagnoses:   Principal Problem:   OA (osteoarthritis) of knee  Estimated body mass index is 25.39 kg/m as calculated from the following:   Height as of this encounter: '5\' 8"'$  (1.727 m).   Weight as of this encounter: 75.8 kg (167 lb).  Procedure:  Procedure(s) (LRB): TOTAL RIGHT KNEE ARTHROPLASTY (Right)   Consults: None  HPI: Kim MORRISSETTEa 82 y56. 83 year old female with end stage OA of her right knee with progressively  worsening pain and dysfunction. She has constant pain, with activity and at rest and significant functional deficits with difficulties even with ADLs. She has had extensive non-op management including analgesics, injections of cortisone and viscosupplements, and home exercise program, but remains in significant pain with significant dysfunction.Radiographs show bone on bone arthritis medial and patellofemoral. She presents now for right Total Knee Arthroplasty.     Laboratory Data: Admission on 10/24/2017  Component Date Value Ref Range Status  . WBC 10/25/2017 9.6  4.0 - 10.5 K/uL Final  . RBC 10/25/2017 3.13* 3.87 - 5.11 MIL/uL Final  . Hemoglobin 10/25/2017 10.2* 12.0 - 15.0 g/dL Final  . HCT 10/25/2017 29.8* 36.0 - 46.0 % Final  . MCV 10/25/2017 95.2  78.0 - 100.0 fL Final  . MCH 10/25/2017 32.6  26.0 - 34.0 pg Final  . MCHC 10/25/2017 34.2  30.0 - 36.0 g/dL Final  . RDW 10/25/2017 13.1  11.5 - 15.5 % Final  . Platelets 10/25/2017 98* 150 - 400 K/uL Final   Comment: SPECIMEN CHECKED FOR CLOTS REPEATED TO VERIFY PLATELET COUNT CONFIRMED BY SMEAR   . Sodium 10/25/2017 136  135 - 145 mmol/L Final  . Potassium 10/25/2017 4.1  3.5 - 5.1 mmol/L Final  . Chloride 10/25/2017 107  101 - 111 mmol/L Final  .  CO2 10/25/2017 25  22 - 32 mmol/L Final  . Glucose, Bld 10/25/2017 126* 65 - 99 mg/dL Final  . BUN 10/25/2017 20  6 - 20 mg/dL Final  . Creatinine, Ser 10/25/2017 0.90  0.44 - 1.00 mg/dL Final  . Calcium 10/25/2017 8.4* 8.9 - 10.3 mg/dL Final  . GFR calc non Af Amer 10/25/2017 59* >60 mL/min Final  . GFR calc Af Amer 10/25/2017 >60  >60 mL/min Final   Comment: (NOTE) The eGFR has been calculated using the CKD EPI equation. This calculation has not been validated in all clinical situations. eGFR's persistently <60 mL/min signify possible Chronic Kidney Disease.   . Anion gap 10/25/2017 4* 5 - 15 Final  . WBC 10/26/2017 9.0  4.0 - 10.5 K/uL Final  . RBC 10/26/2017 3.34* 3.87 - 5.11  MIL/uL Final  . Hemoglobin 10/26/2017 10.5* 12.0 - 15.0 g/dL Final  . HCT 10/26/2017 31.8* 36.0 - 46.0 % Final  . MCV 10/26/2017 95.2  78.0 - 100.0 fL Final  . MCH 10/26/2017 31.4  26.0 - 34.0 pg Final  . MCHC 10/26/2017 33.0  30.0 - 36.0 g/dL Final  . RDW 10/26/2017 13.3  11.5 - 15.5 % Final  . Platelets 10/26/2017 99* 150 - 400 K/uL Final   CONSISTENT WITH PREVIOUS RESULT  . Sodium 10/26/2017 133* 135 - 145 mmol/L Final  . Potassium 10/26/2017 3.8  3.5 - 5.1 mmol/L Final  . Chloride 10/26/2017 102  101 - 111 mmol/L Final  . CO2 10/26/2017 23  22 - 32 mmol/L Final  . Glucose, Bld 10/26/2017 120* 65 - 99 mg/dL Final  . BUN 10/26/2017 18  6 - 20 mg/dL Final  . Creatinine, Ser 10/26/2017 0.86  0.44 - 1.00 mg/dL Final  . Calcium 10/26/2017 8.7* 8.9 - 10.3 mg/dL Final  . GFR calc non Af Amer 10/26/2017 >60  >60 mL/min Final  . GFR calc Af Amer 10/26/2017 >60  >60 mL/min Final   Comment: (NOTE) The eGFR has been calculated using the CKD EPI equation. This calculation has not been validated in all clinical situations. eGFR's persistently <60 mL/min signify possible Chronic Kidney Disease.   Georgiann Hahn gap 10/26/2017 8  5 - 15 Final  Hospital Outpatient Visit on 10/18/2017  Component Date Value Ref Range Status  . aPTT 10/18/2017 32  24 - 36 seconds Final  . WBC 10/18/2017 5.0  4.0 - 10.5 K/uL Final  . RBC 10/18/2017 4.23  3.87 - 5.11 MIL/uL Final  . Hemoglobin 10/18/2017 13.5  12.0 - 15.0 g/dL Final  . HCT 10/18/2017 40.6  36.0 - 46.0 % Final  . MCV 10/18/2017 96.0  78.0 - 100.0 fL Final  . MCH 10/18/2017 31.9  26.0 - 34.0 pg Final  . MCHC 10/18/2017 33.3  30.0 - 36.0 g/dL Final  . RDW 10/18/2017 13.2  11.5 - 15.5 % Final  . Platelets 10/18/2017 141* 150 - 400 K/uL Final  . Sodium 10/18/2017 136  135 - 145 mmol/L Final  . Potassium 10/18/2017 4.8  3.5 - 5.1 mmol/L Final  . Chloride 10/18/2017 103  101 - 111 mmol/L Final  . CO2 10/18/2017 28  22 - 32 mmol/L Final  . Glucose, Bld  10/18/2017 88  65 - 99 mg/dL Final  . BUN 10/18/2017 24* 6 - 20 mg/dL Final  . Creatinine, Ser 10/18/2017 1.06* 0.44 - 1.00 mg/dL Final  . Calcium 10/18/2017 9.5  8.9 - 10.3 mg/dL Final  . Total Protein 10/18/2017 7.1  6.5 -  8.1 g/dL Final  . Albumin 10/18/2017 4.4  3.5 - 5.0 g/dL Final  . AST 10/18/2017 14* 15 - 41 U/L Final  . ALT 10/18/2017 10* 14 - 54 U/L Final  . Alkaline Phosphatase 10/18/2017 45  38 - 126 U/L Final  . Total Bilirubin 10/18/2017 0.9  0.3 - 1.2 mg/dL Final  . GFR calc non Af Amer 10/18/2017 48* >60 mL/min Final  . GFR calc Af Amer 10/18/2017 56* >60 mL/min Final   Comment: (NOTE) The eGFR has been calculated using the CKD EPI equation. This calculation has not been validated in all clinical situations. eGFR's persistently <60 mL/min signify possible Chronic Kidney Disease.   . Anion gap 10/18/2017 5  5 - 15 Final  . Prothrombin Time 10/18/2017 14.0  11.4 - 15.2 seconds Final  . INR 10/18/2017 1.09   Final  . ABO/RH(D) 10/18/2017 A POS   Final  . Antibody Screen 10/18/2017 NEG   Final  . Sample Expiration 10/18/2017 10/27/2017   Final  . Extend sample reason 10/18/2017 NO TRANSFUSIONS OR PREGNANCY IN THE PAST 3 MONTHS   Final  . MRSA, PCR 10/18/2017 NEGATIVE  NEGATIVE Final  . Staphylococcus aureus 10/18/2017 NEGATIVE  NEGATIVE Final   Comment: (NOTE) The Xpert SA Assay (FDA approved for NASAL specimens in patients 54 years of age and older), is one component of a comprehensive surveillance program. It is not intended to diagnose infection nor to guide or monitor treatment.      X-Rays:No results found.  EKG: Orders placed or performed during the hospital encounter of 05/05/17  . EKG 12-Lead  . EKG 12-Lead     Hospital Course: Kim [name] is a 83 y.o. who was admitted to [facility]. They were brought to the operating room on 10/24/2017 and underwent Procedure(s): TOTAL RIGHT KNEE ARTHROPLASTY.  Patient tolerated the procedure well and was  later transferred to the recovery room and then to the orthopaedic floor for postoperative care.  They were given PO and IV analgesics for pain control following their surgery.  They were given 24 hours of postoperative antibiotics of  Anti-infectives (From admission, onward)   Start     Dose/Rate Route Frequency Ordered Stop   10/24/17 2000  vancomycin (VANCOCIN) IVPB 1000 mg/200 mL premix     1,000 mg 200 mL/hr over 60 Minutes Intravenous Every 12 hours 10/24/17 1113 10/24/17 2138   10/24/17 0535  vancomycin (VANCOCIN) IVPB 1000 mg/200 mL premix     1,000 mg 200 mL/hr over 60 Minutes Intravenous On call to O.R. 10/24/17 8315 10/24/17 0813     and started on DVT prophylaxis in the form of Xarelto.   PT and OT were ordered for total joint protocol.  Discharge planning consulted to help with postop disposition and equipment needs.  Patient had a tough night on the evening of surgery.  They started to get up OOB with therapy on day one. Hemovac drain was pulled without difficulty.  Continued to work with therapy into day two.  Dressing was changed on day two and the incision was healing well.   Patient was seen in rounds on day two and was ready to go home.   Diet - Cardiac diet Follow up - in 2 weeks Activity - WBAT Disposition - Home Condition Upon Discharge - Stable D/C Meds - See DC Summary DVT Prophylaxis - Xarelto     Discharge Instructions    Call MD / Call 911   Complete by:  As  directed    If you experience chest pain or shortness of breath, CALL 911 and be transported to the hospital emergency room.  If you develope a fever above 101 F, pus (white drainage) or increased drainage or redness at the wound, or calf pain, call your surgeon's office.   Change dressing   Complete by:  As directed    Change dressing daily with sterile 4 x 4 inch gauze dressing and apply TED hose. Do not submerge the incision under water.   Constipation Prevention   Complete by:  As directed    Drink  plenty of fluids.  Prune juice may be helpful.  You may use a stool softener, such as Colace (over the counter) 100 mg twice a day.  Use MiraLax (over the counter) for constipation as needed.   Diet - low sodium heart healthy   Complete by:  As directed    Discharge instructions   Complete by:  As directed    Take Xarelto for two and a half more weeks, then discontinue Xarelto.  Pick up stool softner and laxative for home use following surgery while on pain medications. Do not submerge incision under water. Please use good hand washing techniques while changing dressing each day. May shower starting three days after surgery. Please use a clean towel to pat the incision dry following showers. Continue to use ice for pain and swelling after surgery. Do not use any lotions or creams on the incision until instructed by your surgeon.  Wear both TED hose on both legs during the day every day for three weeks, but may remove the TED hose at night at home.  Postoperative Constipation Protocol  Constipation - defined medically as fewer than three stools per week and severe constipation as less than one stool per week.  One of the most common issues patients have following surgery is constipation.  Even if you have a regular bowel pattern at home, your normal regimen is likely to be disrupted due to multiple reasons following surgery.  Combination of anesthesia, postoperative narcotics, change in appetite and fluid intake all can affect your bowels.  In order to avoid complications following surgery, here are some recommendations in order to help you during your recovery period.  Colace (docusate) - Pick up an over-the-counter form of Colace or another stool softener and take twice a day as long as you are requiring postoperative pain medications.  Take with a full glass of water daily.  If you experience loose stools or diarrhea, hold the colace until you stool forms back up.  If your symptoms do not  get better within 1 week or if they get worse, check with your doctor.  Dulcolax (bisacodyl) - Pick up over-the-counter and take as directed by the product packaging as needed to assist with the movement of your bowels.  Take with a full glass of water.  Use this product as needed if not relieved by Colace only.   MiraLax (polyethylene glycol) - Pick up over-the-counter to have on hand.  MiraLax is a solution that will increase the amount of water in your bowels to assist with bowel movements.  Take as directed and can mix with a glass of water, juice, soda, coffee, or tea.  Take if you go more than two days without a movement. Do not use MiraLax more than once per day. Call your doctor if you are still constipated or irregular after using this medication for 7 days in a row.  If  you continue to have problems with postoperative constipation, please contact the office for further assistance and recommendations.  If you experience "the worst abdominal pain ever" or develop nausea or vomiting, please contact the office immediatly for further recommendations for treatment.   Do not put a pillow under the knee. Place it under the heel.   Complete by:  As directed    Do not sit on low chairs, stoools or toilet seats, as it may be difficult to get up from low surfaces   Complete by:  As directed    Driving restrictions   Complete by:  As directed    No driving until released by the physician.   Increase activity slowly as tolerated   Complete by:  As directed    Lifting restrictions   Complete by:  As directed    No lifting until released by the physician.   Patient may shower   Complete by:  As directed    You may shower without a dressing once there is no drainage.  Do not wash over the wound.  If drainage remains, do not shower until drainage stops.   TED hose   Complete by:  As directed    Use stockings (TED hose) for 3 weeks on both leg(s).  You may remove them at night for sleeping.   Weight  bearing as tolerated   Complete by:  As directed      Allergies as of 10/26/2017      Reactions   Propoxyphene N-acetaminophen Swelling   tongue swelling   Levaquin [levofloxacin] Hives   Penicillins Swelling   Has patient had a PCN reaction causing immediate rash, facial/tongue/throat swelling, SOB or lightheadedness with hypotension: Yes Has patient had a PCN reaction causing severe rash involving mucus membranes or skin necrosis: No Has patient had a PCN reaction that required hospitalization: No Has patient had a PCN reaction occurring within the last 10 years: No If all of the above answers are "NO", then may proceed with Cephalosporin use. Facial swelling   Pravastatin Other (See Comments)   Bone and muscle pain   Rosuvastatin Other (See Comments)   Bone and muscle pain   Atorvastatin Other (See Comments)   Joint pain Bone and muscle pain   Cortisone Rash   Myrbetriq [mirabegron] Rash   Rash on face   Pseudoephedrine Hcl Er Rash, Other (See Comments)   Face gets red   Repatha [evolocumab] Other (See Comments)   Unsure Myalgias, fatigue, some itching and burning on her feet   Statins Other (See Comments)   Joint pain      Medication List    STOP taking these medications   aspirin 81 MG EC tablet   CENTRUM ADULTS PO   CITRACAL MAXIMUM PO   clarithromycin 500 MG tablet Commonly known as:  BIAXIN   exemestane 25 MG tablet Commonly known as:  AROMASIN   Red Yeast Rice Extract 600 MG Caps   Vitamin B-12 2500 MCG Subl     TAKE these medications   acetaminophen 650 MG CR tablet Commonly known as:  TYLENOL Take 650-1,300 mg by mouth every 8 (eight) hours as needed for pain.   apixaban 2.5 MG Tabs tablet Commonly known as:  ELIQUIS Take 1 tablet (2.5 mg total) by mouth 2 (two) times daily. Take Eliquis twice a day for two and a half more weeks following discharge from the hospital, then discontinue the Eliquis. Once the patient has completed the Eliquis, they  may  resume the 81 mg Aspirin.   ezetimibe 10 MG tablet Commonly known as:  ZETIA Take 1 tablet (10 mg total) by mouth daily.   levothyroxine 112 MCG tablet Commonly known as:  SYNTHROID, LEVOTHROID TAKE 1 TABLET BY MOUTH ONCE DAILY What changed:    how much to take  how to take this  when to take this   methocarbamol 500 MG tablet Commonly known as:  ROBAXIN Take 1 tablet (500 mg total) by mouth every 6 (six) hours as needed for muscle spasms.   metoprolol succinate 50 MG 24 hr tablet Commonly known as:  TOPROL-XL TAKE 1 TABLET BY MOUTH 2 TIMES DAILY WITH OR IMMEDIATELY FOLLOWING A MEAL What changed:  See the new instructions.   oxyCODONE 5 MG immediate release tablet Commonly known as:  Oxy IR/ROXICODONE Take 1-2 tablets (5-10 mg total) by mouth every 4 (four) hours as needed for moderate pain or severe pain.   traMADol 50 MG tablet Commonly known as:  ULTRAM Take 1-2 tablets (50-100 mg total) by mouth every 6 (six) hours as needed for moderate pain (not relieved by oxycodone).            Discharge Care Instructions  (From admission, onward)        Start     Ordered   10/25/17 0000  Weight bearing as tolerated     10/25/17 2101   10/25/17 0000  Change dressing    Comments:  Change dressing daily with sterile 4 x 4 inch gauze dressing and apply TED hose. Do not submerge the incision under water.   10/25/17 2101     Follow-up Information    Gaynelle Arabian, MD. Schedule an appointment as soon as possible for a visit on 11/08/2017.   Specialty:  Orthopedic Surgery Contact information: [address] [place] [address] [phone]           Signed: Arlee Muslim, PA-C Orthopaedic Surgery 10/26/2017, 7:13 AM

## 2017-10-25 NOTE — Progress Notes (Signed)
   10/25/17 1200  OT Visit Information  Last OT Received On 10/25/17  Assistance Needed +1  History of Present Illness RTKA  Precautions  Precautions Knee  Precaution Comments did not wear KI  Pain Assessment  Pain Score 4  Pain Location right knee  Pain Descriptors / Indicators Tender;Sore  Pain Intervention(s) Limited activity within patient's tolerance;Monitored during session;Premedicated before session (handed over to PT)  Cognition  Arousal/Alertness Awake/alert  Behavior During Therapy [facility] for tasks assessed/performed  Overall Cognitive Status Within Functional Limits for tasks assessed  General Comments cues for safety  ADL  Grooming Wash/dry hands;Min Armed forces technical officer Min guard;Ambulation;Comfort height toilet;Grab bars;RW  Actuary Min guard;Sit to/from stand  General ADL Comments Pt was in bathroom on 3:1.  After using this practiced getting on/off comfort height commode twice as she had difficulty urinating at first.  Washed hands twice at sink.  Pt's grab bar is actually a towel bar.  Educate her that we do not recommend using towel bars. Cued her to push from toilet seat, but she had difficulty hearing me.  Cued for safety to step up to sink as she at first tried to reach far from BOS to wash her hands  Restrictions  Weight Bearing Restrictions No  Transfers  Equipment used Rolling walker (2 wheeled)  Sit to Stand Min guard  General transfer comment using grab bar to stand from commode  OT - End of Session  Activity Tolerance Patient tolerated treatment well  Patient left (with PT)  OT Assessment/Plan  OT Visit Diagnosis Pain  Pain - Right/Left Right  Pain - part of body Knee  Follow Up Recommendations Supervision/Assistance - 24 hour  OT Equipment None recommended by OT (likely)  AM-PAC OT "6 Clicks" Daily Activity Outcome Measure  Help from another person eating meals? 4  Help from another person taking care  of personal grooming? 3  Help from another person toileting, which includes using toliet, bedpan, or urinal? 3  Help from another person bathing (including washing, rinsing, drying)? 3  Help from another person to put on and taking off regular upper body clothing? 3  Help from another person to put on and taking off regular lower body clothing? 2  6 Click Score 18  ADL G Code Conversion CK  OT Goal Progression  Progress towards OT goals Progressing toward goals  OT Time Calculation  OT Start Time (ACUTE ONLY) 1112  OT Stop Time (ACUTE ONLY) 1127  OT Time Calculation (min) 15 min  OT General Charges  $OT Visit 1 Visit  OT Treatments  $Self Care/Home Management  8-22 mins  Lesle Chris, OTR/L [phone] 10/25/2017

## 2017-10-25 NOTE — Progress Notes (Signed)
Subjective: 1 Day Post-Op Procedure(s) (LRB): TOTAL RIGHT KNEE ARTHROPLASTY (Right) Patient reports pain as mild and moderate last night.  Little better this morning. Patient seen in rounds for Dr. Wynelle Link. Patient is well, but has had some minor complaints of pain in the knee, requiring pain medications We will start therapy today.  Plan is to go Home after hospital stay.  Objective: Vital signs in last 24 hours: Temp:  [97.8 F (36.6 C)-98.8 F (37.1 C)] 98.8 F (37.1 C) (01/22 1035) Pulse Rate:  [57-80] 57 (01/22 1035) Resp:  [14-18] 14 (01/22 1035) BP: (84-128)/(44-66) 128/60 (01/22 1121) SpO2:  [98 %-100 %] 100 % (01/22 1035)  Intake/Output from previous day:  Intake/Output Summary (Last 24 hours) at 10/25/2017 1247 Last data filed at 10/25/2017 0930 Gross per 24 hour  Intake 1837.5 ml  Output 1175 ml  Net 662.5 ml    Intake/Output this shift: Total I/O In: 120 [P.O.:120] Out: -   Labs: Recent Labs    10/25/17 0539  HGB 10.2*   Recent Labs    10/25/17 0539  WBC 9.6  RBC 3.13*  HCT 29.8*  PLT 98*   Recent Labs    10/25/17 0539  NA 136  K 4.1  CL 107  CO2 25  BUN 20  CREATININE 0.90  GLUCOSE 126*  CALCIUM 8.4*   No results for input(s): LABPT, INR in the last 72 hours.  EXAM General - Patient is Alert, Appropriate and Oriented Extremity - Neurovascular intact Sensation intact distally Intact pulses distally Dorsiflexion/Plantar flexion intact Dressing - dressing C/D/I Motor Function - intact, moving foot and toes well on exam.  Hemovac pulled without difficulty.  Past Medical History:  Diagnosis Date  . Allergic rhinitis   . Allergy   . Arthritis    hands  . Breast cancer of upper-outer quadrant of left female breast ([place]) 11/08/13   finished chemo and radiation 2015  . CAD (coronary artery disease)    a. 12/2013 Cath/PCI: LM nl, LAD 20p, 63m, 30d, D1 30p, LCX 20p, 3m, OM1 20, Mo2 40p, RCA 30p, 90m (4.0x16 Promus Premier DES), 20d,  RPL 30, RPDA 70p, 66m, EF 65-70%.  . Diverticulosis   . HOH (hard of hearing)    bilateral, wears hearing aids  . Hx of adenomatous colonic polyps 02/1999  . Hyperlipidemia   . Hypertension   . Hypothyroidism   . Iron deficiency anemia due to chronic blood loss 01/07/2016  . Low back pain    gets ESI from Dr. Rennis Harding  . Malabsorption of iron 01/07/2016  . Myocardial infarction ([place]) 12/2013   very mild-with first chemo -placed stent x1  . S/P radiation therapy 05/14/2014-06/26/2014   1) Left Breast  / 50 Gy in 25 fractions, 2) Left Supraclavicular fossa/ 47.5 Gy in 25 fractions, 3) Left Posterior Axillary boost / 4.825 Gy in 25 fractions, 4) Left Breast boost / 10 Gy in 5 fractions   . SVD (spontaneous vaginal delivery)    x 2  . Tubulovillous adenoma of colon 12/2009   with HGD  . Urinary tract infection   . Wears hearing aid    left and right     Assessment/Plan: 1 Day Post-Op Procedure(s) (LRB): TOTAL RIGHT KNEE ARTHROPLASTY (Right) Principal Problem:   OA (osteoarthritis) of knee  Estimated body mass index is 25.39 kg/m as calculated from the following:   Height as of this encounter: 5\' 8"  (1.727 m).   Weight as of this encounter: 75.8 kg (167  lb). Advance diet Home tomorrow if meets goals  DVT Prophylaxis - Xarelto Weight-Bearing as tolerated to right leg D/C O2 and Pulse OX and try on Room Air  Arlee Muslim, PA-C Orthopaedic Surgery 10/25/2017, 12:47 PM

## 2017-10-25 NOTE — Progress Notes (Signed)
Dr.Aluisio aware of patients facial flushing and patient stating her face becomes reddened when she is having an allergic reaction and xarelto is the only new medication today.

## 2017-10-26 LAB — BASIC METABOLIC PANEL
Anion gap: 8 (ref 5–15)
BUN: 18 mg/dL (ref 6–20)
CO2: 23 mmol/L (ref 22–32)
Calcium: 8.7 mg/dL — ABNORMAL LOW (ref 8.9–10.3)
Chloride: 102 mmol/L (ref 101–111)
Creatinine, Ser: 0.86 mg/dL (ref 0.44–1.00)
GFR calc Af Amer: >60 mL/min (ref >60)
GFR calc non Af Amer: >60 mL/min (ref >60)
Glucose, Bld: 120 mg/dL — ABNORMAL HIGH (ref 65–99)
Potassium: 3.8 mmol/L (ref 3.5–5.1)
Sodium: 133 mmol/L — ABNORMAL LOW (ref 135–145)

## 2017-10-26 LAB — CBC
HCT: 31.8 % — ABNORMAL LOW (ref 36.0–46.0)
Hemoglobin: 10.5 g/dL — ABNORMAL LOW (ref 12.0–15.0)
MCH: 31.4 pg (ref 26.0–34.0)
MCHC: 33 g/dL (ref 30.0–36.0)
MCV: 95.2 fL (ref 78.0–100.0)
Platelets: 99 10*3/uL — ABNORMAL LOW (ref 150–400)
RBC: 3.34 MIL/uL — ABNORMAL LOW (ref 3.87–5.11)
RDW: 13.3 % (ref 11.5–15.5)
WBC: 9 10*3/uL (ref 4.0–10.5)

## 2017-10-26 MED ORDER — OXYCODONE HCL 5 MG PO TABS: 5.0000 mg | ORAL_TABLET | ORAL | 0 refills | Status: DC | PRN

## 2017-10-26 MED ORDER — TRAMADOL HCL 50 MG PO TABS: 50.0000 mg | ORAL_TABLET | Freq: Four times a day (QID) | ORAL | 0 refills | Status: DC | PRN

## 2017-10-26 NOTE — Progress Notes (Signed)
Physical Therapy Treatment Patient Details Name: Kim [name] MRN: [id number] DOB: 28-Jul-1935 Today's Date: 10/26/2017    History of Present Illness RTKA    PT Comments    Ready for Dc.   Follow Up Recommendations  DC plan and follow up therapy as arranged by surgeon     Equipment Recommendations  None recommended by PT    Recommendations for Other Services       Precautions / Restrictions Precautions Precautions: Knee Precaution Comments: did not wear KI: pt reports she hasn't been using Required Braces or Orthoses: Knee Immobilizer - Right Knee Immobilizer - Right: Discontinue once straight leg raise with < 10 degree lag Restrictions Weight Bearing Restrictions: No       Balance                                            Cognition Arousal/Alertness: Awake/alert Behavior During Therapy: WFL for tasks assessed/performed Overall Cognitive Status: Within Functional Limits for tasks assessed                                        Exercises Total Joint Exercises Ankle Circles/Pumps: AROM;Both;10 reps Quad Sets: AAROM;Both;10 reps Towel Squeeze: AAROM;Both;10 reps Heel Slides: AAROM;Right;10 reps Hip ABduction/ADduction: AAROM;Right;10 reps Straight Leg Raises: AAROM;Right;10 reps    General Comments        Pertinent Vitals/Pain Pain Score: 4  Pain Location: right knee Pain Descriptors / Indicators: Tender;Sore;Aching Pain Intervention(s): Monitored during session;Premedicated before session;Ice applied    Home Living                      Prior Function            PT Goals (current goals can now be found in the care plan section) Progress towards PT goals: Progressing toward goals    Frequency    7X/week      PT Plan Current plan remains appropriate    Co-evaluation              AM-PAC PT "6 Clicks" Daily Activity  Outcome Measure  Difficulty turning over in bed (including adjusting  bedclothes, sheets and blankets)?: A Little Difficulty moving from lying on back to sitting on the side of the bed? : A Little Difficulty sitting down on and standing up from a chair with arms (e.g., wheelchair, bedside commode, etc,.)?: A Little Help needed moving to and from a bed to chair (including a wheelchair)?: A Little Help needed walking in hospital room?: A Lot Help needed climbing 3-5 steps with a railing? : Total 6 Click Score: 15    End of Session Equipment Utilized During Treatment: Right knee immobilizer Activity Tolerance: Patient tolerated treatment well Patient left: in chair;with family/visitor present Nurse Communication: Mobility status PT Visit Diagnosis: Unsteadiness on feet (R26.81);Pain Pain - Right/Left: Right Pain - part of body: Knee     Time: 9628-3662 PT Time Calculation (min) (ACUTE ONLY): 13 min  Charges:  $ $Therapeutic Exercise: 8-22 mins     G Codes:          Kim [name] 10/26/2017, 2:48 PM

## 2017-10-26 NOTE — Progress Notes (Signed)
Physical Therapy Treatment Patient Details Name: Kim [name] MRN: [id number] DOB: Feb 25, 1935 Today's Date: 10/26/2017    History of Present Illness RTKA    PT Comments    Patient reports more pain today. Limited distance as plans Dc. Will review exerises next visit.   Follow Up Recommendations  DC plan and follow up therapy as arranged by surgeon     Equipment Recommendations  None recommended by PT    Recommendations for Other Services       Precautions / Restrictions Precautions Precautions: Knee Precaution Comments: did not wear KI: pt reports she hasn't been using Required Braces or Orthoses: Knee Immobilizer - Right Knee Immobilizer - Right: Discontinue once straight leg raise with < 10 degree lag Restrictions Weight Bearing Restrictions: No    Mobility  Bed Mobility Overal bed mobility: Needs Assistance Bed Mobility: Supine to Sit     Supine to sit: Min assist     General bed mobility comments: assist with right leg  Transfers Overall transfer level: Needs assistance Equipment used: Rolling walker (2 wheeled) Transfers: Sit to/from Stand Sit to Stand: Min assist         General transfer comment: for safety, cues for hand placement  Ambulation/Gait Ambulation/Gait assistance: Min assist Ambulation Distance (Feet): 50 Feet Assistive device: Rolling walker (2 wheeled) Gait Pattern/deviations: Step-to pattern;Step-through pattern;Antalgic     General Gait Details: limited due to pain today.   Stairs            Wheelchair Mobility    Modified Rankin (Stroke Patients Only)       Balance                                            Cognition Arousal/Alertness: Awake/alert Behavior During Therapy: WFL for tasks assessed/performed Overall Cognitive Status: Within Functional Limits for tasks assessed                                        Exercises      General Comments        Pertinent  Vitals/Pain Pain Score: 6  Pain Location: right knee Pain Descriptors / Indicators: Tender;Sore;Aching Pain Intervention(s): Monitored during session;Premedicated before session;Patient requesting pain meds-RN notified;Repositioned;Ice applied;Limited activity within patient's tolerance    Home Living                      Prior Function            PT Goals (current goals can now be found in the care plan section) Progress towards PT goals: Progressing toward goals    Frequency    7X/week      PT Plan Current plan remains appropriate    Co-evaluation              AM-PAC PT "6 Clicks" Daily Activity  Outcome Measure  Difficulty turning over in bed (including adjusting bedclothes, sheets and blankets)?: A Little Difficulty moving from lying on back to sitting on the side of the bed? : A Little Difficulty sitting down on and standing up from a chair with arms (e.g., wheelchair, bedside commode, etc,.)?: A Lot   Help needed walking in hospital room?: A Lot Help needed climbing 3-5 steps with a railing? : Total 6 Click Score: 11  End of Session Equipment Utilized During Treatment: Right knee immobilizer Activity Tolerance: Patient limited by fatigue Patient left: with family/visitor present;in chair Nurse Communication: Mobility status PT Visit Diagnosis: Unsteadiness on feet (R26.81);Pain Pain - Right/Left: Right Pain - part of body: Knee     Time: 0352-4818 PT Time Calculation (min) (ACUTE ONLY): 28 min  Charges:  $Gait Training: 8-22 mins $Self Care/Home Management: Jun 07, 2023                    G Codes:          [name] 10/26/2017, 2:45 PM

## 2017-10-26 NOTE — Progress Notes (Signed)
Subjective: 2 Days Post-Op Procedure(s) (LRB): TOTAL RIGHT KNEE ARTHROPLASTY (Right) Patient reports pain as mild.   Patient seen in rounds by Dr. Wynelle Link. Patient is well, but has had some minor complaints of pain in the knee, requiring pain medications Patient is ready to go home following therapy  Objective: Vital signs in last 24 hours: Temp:  [97.9 F (36.6 C)-99.3 F (37.4 C)] 98.9 F (37.2 C) (01/23 0516) Pulse Rate:  [57-85] 85 (01/23 0630) Resp:  [14-16] 16 (01/23 0630) BP: (84-172)/(44-89) 161/74 (01/23 0630) SpO2:  [90 %-100 %] 99 % (01/23 0630)  Intake/Output from previous day:  Intake/Output Summary (Last 24 hours) at 10/26/2017 3536 Last data filed at 10/26/2017 0200 Gross per 24 hour  Intake 1040 ml  Output 550 ml  Net 490 ml    Intake/Output this shift: No intake/output data recorded.  Labs: Recent Labs    10/25/17 0539 10/26/17 0551  HGB 10.2* 10.5*   Recent Labs    10/25/17 0539 10/26/17 0551  WBC 9.6 9.0  RBC 3.13* 3.34*  HCT 29.8* 31.8*  PLT 98* 99*   Recent Labs    10/25/17 0539 10/26/17 0551  NA 136 133*  K 4.1 3.8  CL 107 102  CO2 25 23  BUN 20 18  CREATININE 0.90 0.86  GLUCOSE 126* 120*  CALCIUM 8.4* 8.7*   No results for input(s): LABPT, INR in the last 72 hours.  EXAM: General - Patient is Alert, Appropriate and Oriented Extremity - Neurovascular intact Sensation intact distally Intact pulses distally Incision - clean, dry Motor Function - intact, moving foot and toes well on exam.   Assessment/Plan: 2 Days Post-Op Procedure(s) (LRB): TOTAL RIGHT KNEE ARTHROPLASTY (Right) Procedure(s) (LRB): TOTAL RIGHT KNEE ARTHROPLASTY (Right) Past Medical History:  Diagnosis Date  . Allergic rhinitis   . Allergy   . Arthritis    hands  . Breast cancer of upper-outer quadrant of left female breast ([place]) 11/08/13   finished chemo and radiation 2015  . CAD (coronary artery disease)    a. 12/2013 Cath/PCI: LM nl, LAD 20p, 51m,  30d, D1 30p, LCX 20p, 22m, OM1 20, Mo2 40p, RCA 30p, 47m (4.0x16 Promus Premier DES), 20d, RPL 30, RPDA 70p, 30m, EF 65-70%.  . Diverticulosis   . HOH (hard of hearing)    bilateral, wears hearing aids  . Hx of adenomatous colonic polyps 02/1999  . Hyperlipidemia   . Hypertension   . Hypothyroidism   . Iron deficiency anemia due to chronic blood loss 01/07/2016  . Low back pain    gets ESI from Dr. Rennis Harding  . Malabsorption of iron 01/07/2016  . Myocardial infarction ([place]) 12/2013   very mild-with first chemo -placed stent x1  . S/P radiation therapy 05/14/2014-06/26/2014   1) Left Breast  / 50 Gy in 25 fractions, 2) Left Supraclavicular fossa/ 47.5 Gy in 25 fractions, 3) Left Posterior Axillary boost / 4.825 Gy in 25 fractions, 4) Left Breast boost / 10 Gy in 5 fractions   . SVD (spontaneous vaginal delivery)    x 2  . Tubulovillous adenoma of colon 12/2009   with HGD  . Urinary tract infection   . Wears hearing aid    left and right    Principal Problem:   OA (osteoarthritis) of knee  Estimated body mass index is 25.39 kg/m as calculated from the following:   Height as of this encounter: 5\' 8"  (1.727 m).   Weight as of this encounter: 75.8 kg (  167 lb). Up with therapy Diet - Cardiac diet Follow up - in 2 weeks Activity - WBAT Disposition - Home Condition Upon Discharge - Stable D/C Meds - See DC Summary DVT Prophylaxis - Xarelto  Arlee Muslim, PA-C Orthopaedic Surgery 10/26/2017, 7:12 AM

## 2017-10-26 NOTE — Progress Notes (Signed)
Occupational Therapy Treatment Patient Details Name: Kim [name] MRN: [id number] DOB: 01-13-1935 Today's Date: 10/26/2017    History of present illness RTKA   OT comments  All education completed. No further OT is needed at this time  Follow Up Recommendations  Supervision/Assistance - 24 hour    Equipment Recommendations  None recommended by OT    Recommendations for Other Services      Precautions / Restrictions Precautions Precautions: Knee Precaution Comments: did not wear KI: pt reports she hasn't been using Restrictions Weight Bearing Restrictions: No       Mobility Bed Mobility               General bed mobility comments: oob  Transfers   Equipment used: Rolling walker (2 wheeled)   Sit to Stand: Min guard         General transfer comment: for safety    Balance                                           ADL either performed or assessed with clinical judgement   ADL                           Toilet Transfer: Min guard;Ambulation;Comfort height toilet;Grab bars;RW   Toileting- Water quality scientist and Hygiene: Min guard;Sit to/from stand   Tub/ Shower Transfer: Walk-in shower;Min guard;Ambulation     General ADL Comments: practiced bathroom transfers:  Pt did not wear KI; educated she can wear it and remove in sitting if she wants more suport as this transfer was uncomfortable. Handout given.  Also re-educated on sidestepping through tight spaces and pushing up from commode seat itself.  Husband and daughter present     Vision       Perception     Praxis      Cognition Arousal/Alertness: Awake/alert Behavior During Therapy: WFL for tasks assessed/performed Overall Cognitive Status: Within Functional Limits for tasks assessed                                          Exercises     Shoulder Instructions       General Comments      Pertinent Vitals/ Pain       Pain Score: 6   Pain Location: right knee Pain Descriptors / Indicators: Tender;Sore Pain Intervention(s): Limited activity within patient's tolerance;Monitored during session;Premedicated before session;Repositioned;Ice applied  Home Living                                          Prior Functioning/Environment              Frequency           Progress Toward Goals  OT Goals(current goals can now be found in the care plan section)  Progress towards OT goals: Progressing toward goals(no further OT is needed)     Plan      Co-evaluation                 AM-PAC PT "6 Clicks" Daily Activity     Outcome Measure   Help from another person eating meals?:  None Help from another person taking care of personal grooming?: A Little Help from another person toileting, which includes using toliet, bedpan, or urinal?: A Little Help from another person bathing (including washing, rinsing, drying)?: A Little Help from another person to put on and taking off regular upper body clothing?: A Little Help from another person to put on and taking off regular lower body clothing?: A Lot 6 Click Score: 18    End of Session CPM Right Knee CPM Right Knee: Off  OT Visit Diagnosis: Pain Pain - Right/Left: Right Pain - part of body: Knee   Activity Tolerance     Patient Left     Nurse Communication          Time: 9798-9211 OT Time Calculation (min): 19 min  Charges: OT General Charges $OT Visit: 1 Visit OT Treatments $Self Care/Home Management : 8-22 mins  Lesle Chris, OTR/L [phone] 10/26/2017   [name] 10/26/2017, 11:21 AM

## 2017-10-28 ENCOUNTER — Other Ambulatory Visit: Payer: Self-pay

## 2017-10-28 ENCOUNTER — Ambulatory Visit: Payer: Medicare Other | Attending: Orthopedic Surgery | Admitting: Physical Therapy

## 2017-10-28 ENCOUNTER — Encounter: Payer: Self-pay | Admitting: Physical Therapy

## 2017-10-28 DIAGNOSIS — R600 Localized edema: Secondary | ICD-10-CM | POA: Diagnosis present

## 2017-10-28 DIAGNOSIS — M25561 Pain in right knee: Secondary | ICD-10-CM | POA: Insufficient documentation

## 2017-10-28 DIAGNOSIS — G8929 Other chronic pain: Secondary | ICD-10-CM | POA: Insufficient documentation

## 2017-10-28 DIAGNOSIS — M6281 Muscle weakness (generalized): Secondary | ICD-10-CM | POA: Insufficient documentation

## 2017-10-28 DIAGNOSIS — R2689 Other abnormalities of gait and mobility: Secondary | ICD-10-CM | POA: Diagnosis present

## 2017-10-28 NOTE — Therapy (Signed)
[place], [place], [address] Phone: [phone]   Fax:  [phone]  Physical Therapy Evaluation  Patient Details  Name: Kim [name] MRN: [id number] Date of Birth: Feb 12, 1935 Referring Provider: Gaynelle Arabian MD   Encounter Date: 10/28/2017  PT End of Session - 10/28/17 0813    Visit Number  1    Number of Visits  17    Date for PT Re-Evaluation  12/23/17    Authorization Type  MCR    PT Start Time  0801    PT Stop Time  0848    PT Time Calculation (min)  47 min    Activity Tolerance  Patient tolerated treatment well;Patient limited by pain    Behavior During Therapy  [facility] for tasks assessed/performed       Past Medical History:  Diagnosis Date  . Allergic rhinitis   . Allergy   . Arthritis    hands  . Breast cancer of upper-outer quadrant of left female breast ([place]) 11/08/13   finished chemo and radiation 2015  . CAD (coronary artery disease)    a. 12/2013 Cath/PCI: LM nl, LAD 20p, 64m, 30d, D1 30p, LCX 20p, 85m, OM1 20, Mo2 40p, RCA 30p, 42m (4.0x16 Promus Premier DES), 20d, RPL 30, RPDA 70p, 49m, EF 65-70%.  . Diverticulosis   . HOH (hard of hearing)    bilateral, wears hearing aids  . Hx of adenomatous colonic polyps 02/1999  . Hyperlipidemia   . Hypertension   . Hypothyroidism   . Iron deficiency anemia due to chronic blood loss 01/07/2016  . Low back pain    gets ESI from Dr. Rennis Harding  . Malabsorption of iron 01/07/2016  . Myocardial infarction ([place]) 12/2013   very mild-with first chemo -placed stent x1  . S/P radiation therapy 05/14/2014-06/26/2014   1) Left Breast  / 50 Gy in 25 fractions, 2) Left Supraclavicular fossa/ 47.5 Gy in 25 fractions, 3) Left Posterior Axillary boost / 4.825 Gy in 25 fractions, 4) Left Breast boost / 10 Gy in 5 fractions   . SVD (spontaneous vaginal delivery)    x 2  . Tubulovillous adenoma of colon 12/2009   with HGD  . Urinary tract infection   . Wears  hearing aid    left and right     Past Surgical History:  Procedure Laterality Date  . ABDOMINAL HYSTERECTOMY    . ANTERIOR AND POSTERIOR REPAIR N/A 01/17/2015   Procedure: CYSTOCELE REPAIR ;  Surgeon: Princess Bruins, MD;  Location: [facility];  Service: Gynecology;  Laterality: N/A;  . BILATERAL SALPINGOOPHORECTOMY     see Laurin Coder NP for GYN exams  . BREAST LUMPECTOMY WITH NEEDLE LOCALIZATION AND AXILLARY LYMPH NODE DISSECTION Left 11/05/2013   Procedure: LEFT BREAST LUMPECTOMY WITH NEEDLE LOCALIZATION and axillary lymph Node Dissection;  Surgeon: Edward Jolly, MD;  Location: [facility];  Service: General;  Laterality: Left;  . BREAST SURGERY  11/2013   left lumpectomy  . CATARACT EXTRACTION W/ INTRAOCULAR LENS  IMPLANT, BILATERAL  2012   bilateral  . COLONOSCOPY  11/04/2015   per Dr. Fuller Plan, adenomatous polyps, no repeats planned   . EYE SURGERY  11/22/2006   cataracts, bilateral, intraocular lens implant  . LEFT HEART CATHETERIZATION WITH CORONARY ANGIOGRAM N/A 12/19/2013   Procedure: LEFT HEART CATHETERIZATION WITH CORONARY ANGIOGRAM;  Surgeon: Burnell Blanks, MD;  Location: [facility] CATH LAB;  Service: Cardiovascular;  Laterality: N/A;  . lumbar epidural  steroid injection     lumber spinal stenosis  . POLYPECTOMY    . PORTACATH PLACEMENT Right 12/11/2013   Procedure: INSERTION PORT-A-CATH;  Surgeon: Edward Jolly, MD;  Location: [facility];  Service: General;  Laterality: Right;  Subclavian Vein;   . TOTAL KNEE ARTHROPLASTY Right 10/24/2017   Procedure: TOTAL RIGHT KNEE ARTHROPLASTY;  Surgeon: Gaynelle Arabian, MD;  Location: [facility];  Service: Orthopedics;  Laterality: Right;    There were no vitals filed for this visit.   Subjective Assessment - 10/28/17 0812    Subjective  pt is a 83 y.o F s/p R TKA on 10/24/2017. Since the surgery I feel like I am doing well, still unable to do all I want to do by my husband and daughter help me out.  Currently use a RW to walking but before the surgery didn't use any AD. Pain stays in the knee with swelling that stays mostly just around the knee.     Pertinent History  PMHx including breast cx, MI, HOH,     Limitations  Standing;Walking;House hold activities    How long can you sit comfortably?  unlimited    How long can you stand comfortably?  3-5 min with RW    How long can you walk comfortably?  3-5 min with RW    Diagnostic tests  x-ray    Patient Stated Goals  get back to walking and out of pain.     Currently in Pain?  Yes    Pain Score  6  last took pain medication at 6 am    Pain Location  Knee    Pain Orientation  Right    Pain Descriptors / Indicators  Discomfort stinging    Pain Type  Surgical pain    Pain Onset  In the past 7 days    Pain Frequency  Constant    Aggravating Factors   walking, laying,     Pain Relieving Factors  medication,     Effect of Pain on Daily Activities  limited standing endurnace / walking without AD         [facility] PT Assessment - 10/28/17 6073      Assessment   Medical Diagnosis  R S/P R TKA    Referring Provider  Gaynelle Arabian MD    Onset Date/Surgical Date  10/24/17    Hand Dominance  Right    Next MD Visit  -- 2-3 weeks    Prior Therapy  no      Precautions   Precautions  None      Restrictions   Weight Bearing Restrictions  No      Balance Screen   Has the patient fallen in the past 6 months  No    Has the patient had a decrease in activity level because of a fear of falling?   No    Is the patient reluctant to leave their home because of a fear of falling?   No      Home Environment   Living Environment  Private residence    Living Arrangements  Spouse/significant other    Available Help at Discharge  Family;Available PRN/intermittently    Type of [place] entrance    Home Layout  Other (Comment) ledge but no steps    [place] - 2 wheels;Shower seat      Prior Function   Level  of Independence  Independent  with basic ADLs    Vocation  Retired    Leisure  sleep      Cognition   Overall Cognitive Status  Within Functional Limits for tasks assessed      Observation/Other Assessments   Observations  incision appears clean and healing well    Focus on Therapeutic Outcomes (FOTO)   99% limited predicted 57% limited      Posture/Postural Control   Posture/Postural Control  Postural limitations    Postural Limitations  Rounded Shoulders;Forward head      ROM / Strength   AROM / PROM / Strength  AROM;PROM;Strength      AROM   AROM Assessment Site  Knee    Right/Left Knee  Right;Left    Right Knee Extension  -22    Right Knee Flexion  56    Left Knee Extension  -6    Left Knee Flexion  123      PROM   PROM Assessment Site  Knee    Right/Left Knee  Right    Right Knee Extension  -20    Right Knee Flexion  70      Strength   Strength Assessment Site  Knee    Right/Left Knee  Right;Left      Palpation   Palpation comment  TTP along the popliteal fossa, and along the proxmial tibia      Ambulation/Gait   Ambulation/Gait  Yes    Assistive device  Rolling walker    Gait Pattern  Step-to pattern;Decreased stride length;Decreased stance time - right;Decreased step length - left;Trendelenburg;Antalgic;Trunk flexed;Decreased trunk rotation             Objective measurements completed on examination: See above findings.      [facility] Adult PT Treatment/Exercise - 10/28/17 0808      Knee/Hip Exercises: Supine   Quad Sets  1 set;10 reps 5 sec hold    Heel Slides  1 set;10 reps;AAROM;Strengthening;Right             PT Education - 10/28/17 0849    Education provided  Yes    Education Details  evaluation findings, POC, goals, HEP with proper form/ rationale.    Person(s) Educated  Patient;Spouse    Methods  Explanation;Demonstration;Verbal cues;Handout    Comprehension  Verbalized understanding;Returned demonstration;Verbal cues required        PT Short Term Goals - 10/28/17 0854      PT SHORT TERM GOAL #1   Title  pt to be I with inital HEP     Time  4    Period  Weeks    Status  New    Target Date  11/25/17      PT SHORT TERM GOAL #2   Title  pt to verbalize / demo techniques to reduce pain and edema via RICE and HEP     Time  4    Period  Weeks    Status  New    Target Date  11/25/17      PT SHORT TERM GOAL #3   Title  pt to improve R knee ROM AAROM to >/= 90 degrees and </= -10 degrees to promote therapuetic progression    Time  4    Period  Weeks    Status  New    Target Date  11/25/17        PT Long Term Goals - 10/28/17 0856      PT LONG TERM GOAL #1   Title  increaes  R knee ROM to >/= 120 degrees and -5 degrees to assist with functional and efficient gait pattern     Time  8    Period  Weeks    Status  New    Target Date  12/23/17      PT LONG TERM GOAL #2   Title  Increase RLE strength to >/= 4+/5 to promote stability and safety with walking/ standing     Time  8    Period  Weeks    Status  New    Target Date  12/23/17      PT LONG TERM GOAL #3   Title  pt to be able to walk/ stand for >/= 45 min and navigate up/down >/= 10 steps with LRAD for endurance required for community ambulation    Time  8    Period  Weeks    Status  New    Target Date  12/23/17      PT LONG TERM GOAL #4   Title  increase FOTO score to </= 53% limited to demo improvement in function    Time  8    Period  Weeks    Status  New    Target Date  12/23/17      PT LONG TERM GOAL #5   Title  pt be I with all HEP given as of last visit to maintain and progress current level of function    Time  8    Period  Weeks    Status  New    Target Date  12/23/17             Plan - 10/28/17 0850    Clinical Impression Statement  pt presents to OPPT with CC R knee pain s/p TKA on 10/24/2017. Limited knee ROM secondary to pain and localized edema as expected following surgery. TTP mostly inthe poplitlea fossa and along  the prixomal tibia. She currently uses a RW with a step to antalgic gait pattern. She would benefit from physical therapy to decrease R knee pain, reduce edema, improve knee mobility and RLE strength by addressing the deficits listed.     Clinical Presentation  Stable    Clinical Decision Making  Low    Rehab Potential  Good    PT Frequency  2x / week    PT Duration  8 weeks    PT Treatment/Interventions  ADLs/Self Care Home Management;Cryotherapy;Balance training;Neuromuscular re-education;Stair training;Gait training;Functional mobility training;Therapeutic activities;Therapeutic exercise;Manual techniques;Vasopneumatic Device;Taping;Passive range of motion;Scar mobilization;Patient/family education    PT Next Visit Plan  (Hx of CX: no heat or e-stim) review/ update HEP, knee ROM, quad/ glute activation, vaso for pain / edema    PT Home Exercise Plan  previously provided HEP from MD, quad set, heel slide with strap, ankle pumps.     Consulted and Agree with Plan of Care  Patient;Family member/caregiver    Family Member Consulted  husband       Patient will benefit from skilled therapeutic intervention in order to improve the following deficits and impairments:  Abnormal gait, Pain, Difficulty walking, Decreased activity tolerance, Increased edema, Postural dysfunction, Improper body mechanics, Decreased range of motion, Decreased strength, Decreased endurance, Hypomobility  Visit Diagnosis: Chronic pain of right knee  Muscle weakness (generalized)  Other abnormalities of gait and mobility  Localized edema     Problem List Patient Active Problem List   Diagnosis Date Noted  . OA (osteoarthritis) of knee 10/24/2017  . Chronic pain of right  knee 06/17/2017  . Osteoporosis 07/30/2016  . Iron deficiency anemia due to chronic blood loss 01/07/2016  . Malabsorption of iron 01/07/2016  . Postoperative state 01/17/2015  . HOCM (hypertrophic obstructive cardiomyopathy) ([place]) 01/09/2014   . Breast cancer ([place]) 01/09/2014  . Coronary atherosclerosis of native coronary artery 12/20/2013  . CAD (coronary artery disease)   . Hyperlipidemia   . Unstable angina ([place]) 12/19/2013  . Breast cancer of upper-outer quadrant of left female breast ([place]) 11/05/2013  . DCIS (ductal carcinoma in situ) of breast 10/26/2013  . Hypertension 04/05/2012  . Cardiac murmur 04/05/2012  . UNSPECIFIED HEARING LOSS 08/07/2010  . LOW BACK PAIN 04/22/2009  . CONSTIPATION 11/04/2008  . COLONIC POLYPS, HX OF 11/01/2008  . ALLERGIC RHINITIS 06/27/2007  . Hypothyroidism 06/22/2007   Starr Lake PT, DPT, LAT, ATC  10/28/17  9:01 AM      [facility] [address] [place], [place], [address] Phone: [phone]   Fax:  [phone]  Name: Kim [name] MRN: [id number] Date of Birth: 01-Nov-1934

## 2017-11-03 ENCOUNTER — Encounter: Payer: Self-pay | Admitting: Physical Therapy

## 2017-11-03 ENCOUNTER — Ambulatory Visit: Payer: Medicare Other | Admitting: Physical Therapy

## 2017-11-03 DIAGNOSIS — R2689 Other abnormalities of gait and mobility: Secondary | ICD-10-CM

## 2017-11-03 DIAGNOSIS — G8929 Other chronic pain: Secondary | ICD-10-CM

## 2017-11-03 DIAGNOSIS — M6281 Muscle weakness (generalized): Secondary | ICD-10-CM

## 2017-11-03 DIAGNOSIS — M25561 Pain in right knee: Secondary | ICD-10-CM | POA: Diagnosis not present

## 2017-11-03 DIAGNOSIS — R600 Localized edema: Secondary | ICD-10-CM

## 2017-11-03 NOTE — Therapy (Signed)
Kim [name], [place], [address] Phone: [phone]   Fax:  [phone]  Physical Therapy Treatment  Patient Details  Name: Kim [name] MRN: [id number] Date of Birth: 1935/07/29 Referring Provider: Gaynelle Arabian MD   Encounter Date: 11/03/2017  PT End of Session - 11/03/17 1136    Visit Number  2    Number of Visits  17    Date for PT Re-Evaluation  12/23/17    Authorization Type  MCR    PT Start Time  1100    PT Stop Time  1150    PT Time Calculation (min)  50 min    Activity Tolerance  Patient tolerated treatment well;Patient limited by pain    Behavior During Therapy  [facility] for tasks assessed/performed       Past Medical History:  Diagnosis Date  . Allergic rhinitis   . Allergy   . Arthritis    hands  . Breast cancer of upper-outer quadrant of left female breast ([place]) 11/08/13   finished chemo and radiation 2015  . CAD (coronary artery disease)    a. 12/2013 Cath/PCI: LM nl, LAD 20p, 107m, 30d, D1 30p, LCX 20p, 39m, OM1 20, Mo2 40p, RCA 30p, 30m (4.0x16 Promus Premier DES), 20d, RPL 30, RPDA 70p, 79m, EF 65-70%.  . Diverticulosis   . HOH (hard of hearing)    bilateral, wears hearing aids  . Hx of adenomatous colonic polyps 02/1999  . Hyperlipidemia   . Hypertension   . Hypothyroidism   . Iron deficiency anemia due to chronic blood loss 01/07/2016  . Low back pain    gets ESI from Dr. Rennis Harding  . Malabsorption of iron 01/07/2016  . Myocardial infarction ([place]) 12/2013   very mild-with first chemo -placed stent x1  . S/P radiation therapy 05/14/2014-06/26/2014   1) Left Breast  / 50 Gy in 25 fractions, 2) Left Supraclavicular fossa/ 47.5 Gy in 25 fractions, 3) Left Posterior Axillary boost / 4.825 Gy in 25 fractions, 4) Left Breast boost / 10 Gy in 5 fractions   . SVD (spontaneous vaginal delivery)    x 2  . Tubulovillous adenoma of colon 12/2009   with HGD  . Urinary tract infection   . Wears  hearing aid    left and right     Past Surgical History:  Procedure Laterality Date  . ABDOMINAL HYSTERECTOMY    . ANTERIOR AND POSTERIOR REPAIR N/A 01/17/2015   Procedure: CYSTOCELE REPAIR ;  Surgeon: Princess Bruins, MD;  Location: [facility];  Service: Gynecology;  Laterality: N/A;  . BILATERAL SALPINGOOPHORECTOMY     see Laurin Coder NP for GYN exams  . BREAST LUMPECTOMY WITH NEEDLE LOCALIZATION AND AXILLARY LYMPH NODE DISSECTION Left 11/05/2013   Procedure: LEFT BREAST LUMPECTOMY WITH NEEDLE LOCALIZATION and axillary lymph Node Dissection;  Surgeon: Edward Jolly, MD;  Location: [facility];  Service: General;  Laterality: Left;  . BREAST SURGERY  11/2013   left lumpectomy  . CATARACT EXTRACTION W/ INTRAOCULAR LENS  IMPLANT, BILATERAL  2012   bilateral  . COLONOSCOPY  11/04/2015   per Dr. Fuller Plan, adenomatous polyps, no repeats planned   . EYE SURGERY  11/22/2006   cataracts, bilateral, intraocular lens implant  . LEFT HEART CATHETERIZATION WITH CORONARY ANGIOGRAM N/A 12/19/2013   Procedure: LEFT HEART CATHETERIZATION WITH CORONARY ANGIOGRAM;  Surgeon: Burnell Blanks, MD;  Location: [facility] CATH LAB;  Service: Cardiovascular;  Laterality: N/A;  . lumbar epidural  steroid injection     lumber spinal stenosis  . POLYPECTOMY    . PORTACATH PLACEMENT Right 12/11/2013   Procedure: INSERTION PORT-A-CATH;  Surgeon: Edward Jolly, MD;  Location: [place];  Service: General;  Laterality: Right;  Subclavian Vein;   . TOTAL KNEE ARTHROPLASTY Right 10/24/2017   Procedure: TOTAL RIGHT KNEE ARTHROPLASTY;  Surgeon: Gaynelle Arabian, MD;  Location: [facility];  Service: Orthopedics;  Laterality: Right;    There were no vitals filed for this visit.  Subjective Assessment - 11/03/17 1104    Subjective  doing well; having a little pain today.    Patient Stated Goals  get back to walking and out of pain.     Currently in Pain?  Yes    Pain Score  4     Pain Location   Knee    Pain Orientation  Right    Pain Descriptors / Indicators  Discomfort    Pain Type  Surgical pain    Pain Onset  In the past 7 days    Pain Frequency  Constant    Aggravating Factors   walkking, lying    Pain Relieving Factors  medication                      [facility] Adult PT Treatment/Exercise - 11/03/17 1105      Knee/Hip Exercises: Aerobic   Nustep  L3 x 8 min      Knee/Hip Exercises: Supine   Quad Sets  1 set;15 reps 5 sec hold    Short Arc Quad Sets  Right;10 reps 5 sec hold    Heel Slides  1 set;AAROM;Strengthening;Right;15 reps mod cues for technique    Straight Leg Raises  Right;10 reps extensor lag present      Modalities   Modalities  Vasopneumatic      Vasopneumatic   Number Minutes Vasopneumatic   15 minutes    Vasopnuematic Location   Knee    Vasopneumatic Pressure  Low    Vasopneumatic Temperature   34      Manual Therapy   Manual Therapy  Joint mobilization;Passive ROM    Joint Mobilization  patellar mobs    Passive ROM  Rt knee flexion/extension; pt very guarded; but able to get to ~ 90 deg flexion when she relaxed               PT Short Term Goals - 10/28/17 0854      PT SHORT TERM GOAL #1   Title  pt to be I with inital HEP     Time  4    Period  Weeks    Status  New    Target Date  11/25/17      PT SHORT TERM GOAL #2   Title  pt to verbalize / demo techniques to reduce pain and edema via RICE and HEP     Time  4    Period  Weeks    Status  New    Target Date  11/25/17      PT SHORT TERM GOAL #3   Title  pt to improve R knee ROM AAROM to >/= 90 degrees and </= -10 degrees to promote therapuetic progression    Time  4    Period  Weeks    Status  New    Target Date  11/25/17        PT Long Term Goals - 10/28/17 6503  PT LONG TERM GOAL #1   Title  increaes R knee ROM to >/= 120 degrees and -5 degrees to assist with functional and efficient gait pattern     Time  8    Period  Weeks    Status  New     Target Date  12/23/17      PT LONG TERM GOAL #2   Title  Increase RLE strength to >/= 4+/5 to promote stability and safety with walking/ standing     Time  8    Period  Weeks    Status  New    Target Date  12/23/17      PT LONG TERM GOAL #3   Title  pt to be able to walk/ stand for >/= 45 min and navigate up/down >/= 10 steps with LRAD for endurance required for community ambulation    Time  8    Period  Weeks    Status  New    Target Date  12/23/17      PT LONG TERM GOAL #4   Title  increase FOTO score to </= 53% limited to demo improvement in function    Time  8    Period  Weeks    Status  New    Target Date  12/23/17      PT LONG TERM GOAL #5   Title  pt be I with all HEP given as of last visit to maintain and progress current level of function    Time  8    Period  Weeks    Status  New    Target Date  12/23/17            Plan - 11/03/17 1137    Clinical Impression Statement  Pt limited by pain today and very guarded with manual therapy today.  Session complicated by her battery dying in her hearing aids so difficultly with communicating with pt.  Will continue to benefit from PT to maximize function.    PT Treatment/Interventions  ADLs/Self Care Home Management;Cryotherapy;Balance training;Neuromuscular re-education;Stair training;Gait training;Functional mobility training;Therapeutic activities;Therapeutic exercise;Manual techniques;Vasopneumatic Device;Taping;Passive range of motion;Scar mobilization;Patient/family education    PT Next Visit Plan  (Hx of CX: no heat or e-stim) review/ update HEP, knee ROM, quad/ glute activation, vaso for pain / edema    PT Home Exercise Plan  previously provided HEP from MD, quad set, heel slide with strap, ankle pumps.     Consulted and Agree with Plan of Care  Patient       Patient will benefit from skilled therapeutic intervention in order to improve the following deficits and impairments:  Abnormal gait, Pain, Difficulty  walking, Decreased activity tolerance, Increased edema, Postural dysfunction, Improper body mechanics, Decreased range of motion, Decreased strength, Decreased endurance, Hypomobility  Visit Diagnosis: Chronic pain of right knee  Muscle weakness (generalized)  Other abnormalities of gait and mobility  Localized edema     Problem List Patient Active Problem List   Diagnosis Date Noted  . OA (osteoarthritis) of knee 10/24/2017  . Chronic pain of right knee 06/17/2017  . Osteoporosis 07/30/2016  . Iron deficiency anemia due to chronic blood loss 01/07/2016  . Malabsorption of iron 01/07/2016  . Postoperative state 01/17/2015  . HOCM (hypertrophic obstructive cardiomyopathy) ([place]) 01/09/2014  . Breast cancer ([place]) 01/09/2014  . Coronary atherosclerosis of native coronary artery 12/20/2013  . CAD (coronary artery disease)   . Hyperlipidemia   . Unstable angina ([place]) 12/19/2013  . Breast cancer  of upper-outer quadrant of left female breast ([place]) 11/05/2013  . DCIS (ductal carcinoma in situ) of breast 10/26/2013  . Hypertension 04/05/2012  . Cardiac murmur 04/05/2012  . UNSPECIFIED HEARING LOSS 08/07/2010  . LOW BACK PAIN 04/22/2009  . CONSTIPATION 11/04/2008  . COLONIC POLYPS, HX OF 11/01/2008  . ALLERGIC RHINITIS 06/27/2007  . Hypothyroidism 06/22/2007      Laureen Abrahams, PT, DPT 11/03/17 11:40 AM     [facility] [address] [place], [place], [address] Phone: [phone]   Fax:  [phone]  Name: TORIANA SPONSEL MRN: [id number] Date of Birth: 1935/08/25

## 2017-11-08 ENCOUNTER — Ambulatory Visit: Payer: Medicare Other | Attending: Orthopedic Surgery | Admitting: Physical Therapy

## 2017-11-08 ENCOUNTER — Encounter: Payer: Self-pay | Admitting: Physical Therapy

## 2017-11-08 DIAGNOSIS — M6281 Muscle weakness (generalized): Secondary | ICD-10-CM | POA: Insufficient documentation

## 2017-11-08 DIAGNOSIS — G8929 Other chronic pain: Secondary | ICD-10-CM

## 2017-11-08 DIAGNOSIS — R600 Localized edema: Secondary | ICD-10-CM | POA: Diagnosis present

## 2017-11-08 DIAGNOSIS — R2689 Other abnormalities of gait and mobility: Secondary | ICD-10-CM | POA: Diagnosis present

## 2017-11-08 DIAGNOSIS — M25561 Pain in right knee: Secondary | ICD-10-CM | POA: Diagnosis not present

## 2017-11-08 NOTE — Therapy (Signed)
Kim [name], [place], [address] Phone: [phone]   Fax:  [phone]  Physical Therapy Treatment  Patient Details  Name: Kim [name] MRN: [id number] Date of Birth: Jul 18, 1935 Referring Provider: Gaynelle Arabian MD   Encounter Date: 11/08/2017  PT End of Session - 11/08/17 1012    Visit Number  3    Number of Visits  17    Date for PT Re-Evaluation  12/23/17    Authorization Type  MCR    PT Start Time  0931    PT Stop Time  1026    PT Time Calculation (min)  55 min    Equipment Utilized During Treatment  Gait belt    Activity Tolerance  Patient tolerated treatment well;Patient limited by pain    Behavior During Therapy  [facility] for tasks assessed/performed       Past Medical History:  Diagnosis Date  . Allergic rhinitis   . Allergy   . Arthritis    hands  . Breast cancer of upper-outer quadrant of left female breast ([place]) 11/08/13   finished chemo and radiation 2015  . CAD (coronary artery disease)    a. 12/2013 Cath/PCI: LM nl, LAD 20p, 57m, 30d, D1 30p, LCX 20p, 4m, OM1 20, Mo2 40p, RCA 30p, 5m (4.0x16 Promus Premier DES), 20d, RPL 30, RPDA 70p, 35m, EF 65-70%.  . Diverticulosis   . HOH (hard of hearing)    bilateral, wears hearing aids  . Hx of adenomatous colonic polyps 02/1999  . Hyperlipidemia   . Hypertension   . Hypothyroidism   . Iron deficiency anemia due to chronic blood loss 01/07/2016  . Low back pain    gets ESI from Dr. Rennis Harding  . Malabsorption of iron 01/07/2016  . Myocardial infarction ([place]) 12/2013   very mild-with first chemo -placed stent x1  . S/P radiation therapy 05/14/2014-06/26/2014   1) Left Breast  / 50 Gy in 25 fractions, 2) Left Supraclavicular fossa/ 47.5 Gy in 25 fractions, 3) Left Posterior Axillary boost / 4.825 Gy in 25 fractions, 4) Left Breast boost / 10 Gy in 5 fractions   . SVD (spontaneous vaginal delivery)    x 2  . Tubulovillous adenoma of colon 12/2009   with  HGD  . Urinary tract infection   . Wears hearing aid    left and right     Past Surgical History:  Procedure Laterality Date  . ABDOMINAL HYSTERECTOMY    . ANTERIOR AND POSTERIOR REPAIR N/A 01/17/2015   Procedure: CYSTOCELE REPAIR ;  Surgeon: Princess Bruins, MD;  Location: [facility];  Service: Gynecology;  Laterality: N/A;  . BILATERAL SALPINGOOPHORECTOMY     see Laurin Coder NP for GYN exams  . BREAST LUMPECTOMY WITH NEEDLE LOCALIZATION AND AXILLARY LYMPH NODE DISSECTION Left 11/05/2013   Procedure: LEFT BREAST LUMPECTOMY WITH NEEDLE LOCALIZATION and axillary lymph Node Dissection;  Surgeon: Edward Jolly, MD;  Location: [place];  Service: General;  Laterality: Left;  . BREAST SURGERY  11/2013   left lumpectomy  . CATARACT EXTRACTION W/ INTRAOCULAR LENS  IMPLANT, BILATERAL  2012   bilateral  . COLONOSCOPY  11/04/2015   per Dr. Fuller Plan, adenomatous polyps, no repeats planned   . EYE SURGERY  11/22/2006   cataracts, bilateral, intraocular lens implant  . LEFT HEART CATHETERIZATION WITH CORONARY ANGIOGRAM N/A 12/19/2013   Procedure: LEFT HEART CATHETERIZATION WITH CORONARY ANGIOGRAM;  Surgeon: Burnell Blanks, MD;  Location: [facility] CATH LAB;  Service: Cardiovascular;  Laterality: N/A;  . lumbar epidural steroid injection     lumber spinal stenosis  . POLYPECTOMY    . PORTACATH PLACEMENT Right 12/11/2013   Procedure: INSERTION PORT-A-CATH;  Surgeon: Edward Jolly, MD;  Location: [place];  Service: General;  Laterality: Right;  Subclavian Vein;   . TOTAL KNEE ARTHROPLASTY Right 10/24/2017   Procedure: TOTAL RIGHT KNEE ARTHROPLASTY;  Surgeon: Gaynelle Arabian, MD;  Location: [facility];  Service: Orthopedics;  Laterality: Right;    There were no vitals filed for this visit.  Subjective Assessment - 11/08/17 0935    Subjective  knee is doing pretty well; trying to stretch out pain medication and seems to be going well.    Patient Stated Goals  get back  to walking and out of pain.     Currently in Pain?  Yes    Pain Score  6     Pain Location  Knee    Pain Orientation  Right    Pain Descriptors / Indicators  Discomfort    Pain Type  Surgical pain;Acute pain    Pain Onset  In the past 7 days    Pain Frequency  Constant    Aggravating Factors   walking, lying    Pain Relieving Factors  medication         [facility] PT Assessment - 11/08/17 0936      AROM   Right Knee Extension  6    Right Knee Flexion  75      PROM   Right Knee Extension  0    Right Knee Flexion  90                  [facility] Adult PT Treatment/Exercise - 11/08/17 0936      Ambulation/Gait   Ambulation/Gait  Yes    Ambulation/Gait Assistance  4: Min guard    Ambulation Distance (Feet)  200 Feet    Assistive device  Straight cane    Gait Pattern  Step-to pattern;Decreased stride length;Decreased stance time - right;Decreased step length - left;Trendelenburg;Antalgic;Trunk flexed;Decreased trunk rotation    Gait Comments  cues for sequencing and step length with SPC      Knee/Hip Exercises: Aerobic   Nustep  L3 x 8 min      Knee/Hip Exercises: Supine   Quad Sets  Right;20 reps 5sec hold    Heel Slides  1 set;AAROM;Strengthening;Right;20 reps      Vasopneumatic   Number Minutes Vasopneumatic   15 minutes    Vasopnuematic Location   Knee    Vasopneumatic Pressure  Low    Vasopneumatic Temperature   34      Manual Therapy   Manual Therapy  Joint mobilization;Passive ROM    Joint Mobilization  patellar mobs    Passive ROM  Rt knee flexion/extension; pt very guarded; but able to get to ~ 90 deg flexion when she relaxed               PT Short Term Goals - 10/28/17 0854      PT SHORT TERM GOAL #1   Title  pt to be I with inital HEP     Time  4    Period  Weeks    Status  New    Target Date  11/25/17      PT SHORT TERM GOAL #2   Title  pt to verbalize / demo techniques to reduce pain and edema via RICE and HEP  Time  4    Period   Weeks    Status  New    Target Date  11/25/17      PT SHORT TERM GOAL #3   Title  pt to improve R knee ROM AAROM to >/= 90 degrees and </= -10 degrees to promote therapuetic progression    Time  4    Period  Weeks    Status  New    Target Date  11/25/17        PT Long Term Goals - 10/28/17 0856      PT LONG TERM GOAL #1   Title  increaes R knee ROM to >/= 120 degrees and -5 degrees to assist with functional and efficient gait pattern     Time  8    Period  Weeks    Status  New    Target Date  12/23/17      PT LONG TERM GOAL #2   Title  Increase RLE strength to >/= 4+/5 to promote stability and safety with walking/ standing     Time  8    Period  Weeks    Status  New    Target Date  12/23/17      PT LONG TERM GOAL #3   Title  pt to be able to walk/ stand for >/= 45 min and navigate up/down >/= 10 steps with LRAD for endurance required for community ambulation    Time  8    Period  Weeks    Status  New    Target Date  12/23/17      PT LONG TERM GOAL #4   Title  increase FOTO score to </= 53% limited to demo improvement in function    Time  8    Period  Weeks    Status  New    Target Date  12/23/17      PT LONG TERM GOAL #5   Title  pt be I with all HEP given as of last visit to maintain and progress current level of function    Time  8    Period  Weeks    Status  New    Target Date  12/23/17            Plan - 11/08/17 1012    Clinical Impression Statement  Pt with improved AROM 6-70 today, but continues to be limited by pain.  Progressing well with PT, and anticipate will be ready to transition to [facility] next 1-2 weeks.    PT Treatment/Interventions  ADLs/Self Care Home Management;Cryotherapy;Balance training;Neuromuscular re-education;Stair training;Gait training;Functional mobility training;Therapeutic activities;Therapeutic exercise;Manual techniques;Vasopneumatic Device;Taping;Passive range of motion;Scar mobilization;Patient/family education    PT Next  Visit Plan  (Hx of CX: no heat or e-stim) review/ update HEP, knee ROM, quad/ glute activation, vaso for pain / edema, continue gait training with cane    Consulted and Agree with Plan of Care  Patient       Patient will benefit from skilled therapeutic intervention in order to improve the following deficits and impairments:  Abnormal gait, Pain, Difficulty walking, Decreased activity tolerance, Increased edema, Postural dysfunction, Improper body mechanics, Decreased range of motion, Decreased strength, Decreased endurance, Hypomobility  Visit Diagnosis: Chronic pain of right knee  Muscle weakness (generalized)  Other abnormalities of gait and mobility  Localized edema     Problem List Patient Active Problem List   Diagnosis Date Noted  . OA (osteoarthritis) of knee 10/24/2017  . Chronic pain of  right knee 06/17/2017  . Osteoporosis 07/30/2016  . Iron deficiency anemia due to chronic blood loss 01/07/2016  . Malabsorption of iron 01/07/2016  . Postoperative state 01/17/2015  . HOCM (hypertrophic obstructive cardiomyopathy) ([place]) 01/09/2014  . Breast cancer ([place]) 01/09/2014  . Coronary atherosclerosis of native coronary artery 12/20/2013  . CAD (coronary artery disease)   . Hyperlipidemia   . Unstable angina ([place]) 12/19/2013  . Breast cancer of upper-outer quadrant of left female breast ([place]) 11/05/2013  . DCIS (ductal carcinoma in situ) of breast 10/26/2013  . Hypertension 04/05/2012  . Cardiac murmur 04/05/2012  . UNSPECIFIED HEARING LOSS 08/07/2010  . LOW BACK PAIN 04/22/2009  . CONSTIPATION 11/04/2008  . COLONIC POLYPS, HX OF 11/01/2008  . ALLERGIC RHINITIS 06/27/2007  . Hypothyroidism 06/22/2007      Laureen Abrahams, PT, DPT 11/08/17 10:14 AM    [facility] [address] [place], [place], [address] Phone: [phone]   Fax:  [phone]  Name: ARNELL MAUSOLF MRN: [id number] Date of Birth:  [birth year]

## 2017-11-10 ENCOUNTER — Ambulatory Visit: Payer: Medicare Other | Admitting: Physical Therapy

## 2017-11-10 ENCOUNTER — Encounter: Payer: Self-pay | Admitting: Physical Therapy

## 2017-11-10 DIAGNOSIS — G8929 Other chronic pain: Secondary | ICD-10-CM

## 2017-11-10 DIAGNOSIS — M25561 Pain in right knee: Principal | ICD-10-CM

## 2017-11-10 DIAGNOSIS — R2689 Other abnormalities of gait and mobility: Secondary | ICD-10-CM

## 2017-11-10 DIAGNOSIS — R600 Localized edema: Secondary | ICD-10-CM

## 2017-11-10 DIAGNOSIS — M6281 Muscle weakness (generalized): Secondary | ICD-10-CM

## 2017-11-10 NOTE — Therapy (Signed)
[place], [place], [address] Phone: [phone]   Fax:  [phone]  Physical Therapy Treatment  Patient Details  Name: Kim [name] MRN: [id number] Date of Birth: [birth year] Referring Provider: Gaynelle Arabian MD   Encounter Date: 11/10/2017  PT End of Session - 11/10/17 1409    Visit Number  4    Number of Visits  17    Date for PT Re-Evaluation  12/23/17    Authorization Type  MCR    PT Start Time  1325    PT Stop Time  1423    PT Time Calculation (min)  58 min    Equipment Utilized During Treatment  Gait belt    Activity Tolerance  Patient tolerated treatment well    Behavior During Therapy  [facility] for tasks assessed/performed       Past Medical History:  Diagnosis Date  . Allergic rhinitis   . Allergy   . Arthritis    hands  . Breast cancer of upper-outer quadrant of left female breast ([place]) 11/08/13   finished chemo and radiation 2015  . CAD (coronary artery disease)    a. 12/2013 Cath/PCI: LM nl, LAD 20p, 24m, 30d, D1 30p, LCX 20p, 90m, OM1 20, Mo2 40p, RCA 30p, 21m (4.0x16 Promus Premier DES), 20d, RPL 30, RPDA 70p, 63m, EF 65-70%.  . Diverticulosis   . HOH (hard of hearing)    bilateral, wears hearing aids  . Hx of adenomatous colonic polyps 02/1999  . Hyperlipidemia   . Hypertension   . Hypothyroidism   . Iron deficiency anemia due to chronic blood loss 01/07/2016  . Low back pain    gets ESI from Dr. Rennis Harding  . Malabsorption of iron 01/07/2016  . Myocardial infarction ([place]) 12/2013   very mild-with first chemo -placed stent x1  . S/P radiation therapy 05/14/2014-06/26/2014   1) Left Breast  / 50 Gy in 25 fractions, 2) Left Supraclavicular fossa/ 47.5 Gy in 25 fractions, 3) Left Posterior Axillary boost / 4.825 Gy in 25 fractions, 4) Left Breast boost / 10 Gy in 5 fractions   . SVD (spontaneous vaginal delivery)    x 2  . Tubulovillous adenoma of colon 12/2009   with HGD  . Urinary tract  infection   . Wears hearing aid    left and right     Past Surgical History:  Procedure Laterality Date  . ABDOMINAL HYSTERECTOMY    . ANTERIOR AND POSTERIOR REPAIR N/A 01/17/2015   Procedure: CYSTOCELE REPAIR ;  Surgeon: Princess Bruins, MD;  Location: [facility];  Service: Gynecology;  Laterality: N/A;  . BILATERAL SALPINGOOPHORECTOMY     see Laurin Coder NP for GYN exams  . BREAST LUMPECTOMY WITH NEEDLE LOCALIZATION AND AXILLARY LYMPH NODE DISSECTION Left 11/05/2013   Procedure: LEFT BREAST LUMPECTOMY WITH NEEDLE LOCALIZATION and axillary lymph Node Dissection;  Surgeon: Edward Jolly, MD;  Location: [place];  Service: General;  Laterality: Left;  . BREAST SURGERY  11/2013   left lumpectomy  . CATARACT EXTRACTION W/ INTRAOCULAR LENS  IMPLANT, BILATERAL  2012   bilateral  . COLONOSCOPY  11/04/2015   per Dr. Fuller Plan, adenomatous polyps, no repeats planned   . EYE SURGERY  11/22/2006   cataracts, bilateral, intraocular lens implant  . LEFT HEART CATHETERIZATION WITH CORONARY ANGIOGRAM N/A 12/19/2013   Procedure: LEFT HEART CATHETERIZATION WITH CORONARY ANGIOGRAM;  Surgeon: Burnell Blanks, MD;  Location: [facility] CATH LAB;  Service: Cardiovascular;  Laterality: N/A;  . lumbar epidural steroid injection     lumber spinal stenosis  . POLYPECTOMY    . PORTACATH PLACEMENT Right 12/11/2013   Procedure: INSERTION PORT-A-CATH;  Surgeon: Edward Jolly, MD;  Location: [place];  Service: General;  Laterality: Right;  Subclavian Vein;   . TOTAL KNEE ARTHROPLASTY Right 10/24/2017   Procedure: TOTAL RIGHT KNEE ARTHROPLASTY;  Surgeon: Gaynelle Arabian, MD;  Location[facility];  Service: Orthopedics;  Laterality: Right;    There were no vitals filed for this visit.  Subjective Assessment - 11/10/17 1329    Subjective  went to MD yesterday; he's pleased with progress so far.  goes back in 3 weeks.  changed pain medication but pt isn't sure of name.    Patient  Stated Goals  get back to walking and out of pain.     Currently in Pain?  Yes    Pain Score  7     Pain Location  Knee    Pain Orientation  Right    Pain Descriptors / Indicators  Discomfort    Pain Type  Surgical pain;Acute pain    Pain Onset  In the past 7 days    Pain Frequency  Constant    Aggravating Factors   walking, lying    Pain Relieving Factors  medication                      [facility] Adult PT Treatment/Exercise - 11/10/17 1330      Ambulation/Gait   Ambulation/Gait  Yes    Ambulation/Gait Assistance  4: Min guard    Ambulation Distance (Feet)  200 Feet    Assistive device  Straight cane    Gait Pattern  Step-to pattern;Decreased stride length;Decreased stance time - right;Decreased step length - left;Trendelenburg;Antalgic;Trunk flexed;Decreased trunk rotation    Gait Comments  improved sequencing      Knee/Hip Exercises: Aerobic   Recumbent Bike  partial revolutions x 8 min      Knee/Hip Exercises: Seated   Long Arc Quad  Right;15 reps;Weights    Long Arc Quad Weight  2 lbs. 5 sec hold    Other Seated Knee/Hip Exercises  seated knee flexion to work on flexion ROM 5 x 30-60 sec    Hamstring Curl  Right;15 reps red theraband      Knee/Hip Exercises: Supine   Quad Sets  Right;10 reps long sitting with improved quad activation      Modalities   Modalities  Vasopneumatic      Vasopneumatic   Number Minutes Vasopneumatic   15 minutes    Vasopnuematic Location   Knee    Vasopneumatic Pressure  Low    Vasopneumatic Temperature   34      Manual Therapy   Manual Therapy  Passive ROM    Passive ROM  knee extension; poor tolerance               PT Short Term Goals - 10/28/17 0854      PT SHORT TERM GOAL #1   Title  pt to be I with inital HEP     Time  4    Period  Weeks    Status  New    Target Date  11/25/17      PT SHORT TERM GOAL #2   Title  pt to verbalize / demo techniques to reduce pain and edema via RICE and HEP     Time  4  Period  Weeks    Status  New    Target Date  11/25/17      PT SHORT TERM GOAL #3   Title  pt to improve R knee ROM AAROM to >/= 90 degrees and </= -10 degrees to promote therapuetic progression    Time  4    Period  Weeks    Status  New    Target Date  11/25/17        PT Long Term Goals - 10/28/17 0856      PT LONG TERM GOAL #1   Title  increaes R knee ROM to >/= 120 degrees and -5 degrees to assist with functional and efficient gait pattern     Time  8    Period  Weeks    Status  New    Target Date  12/23/17      PT LONG TERM GOAL #2   Title  Increase RLE strength to >/= 4+/5 to promote stability and safety with walking/ standing     Time  8    Period  Weeks    Status  New    Target Date  12/23/17      PT LONG TERM GOAL #3   Title  pt to be able to walk/ stand for >/= 45 min and navigate up/down >/= 10 steps with LRAD for endurance required for community ambulation    Time  8    Period  Weeks    Status  New    Target Date  12/23/17      PT LONG TERM GOAL #4   Title  increase FOTO score to </= 53% limited to demo improvement in function    Time  8    Period  Weeks    Status  New    Target Date  12/23/17      PT LONG TERM GOAL #5   Title  pt be I with all HEP given as of last visit to maintain and progress current level of function    Time  8    Period  Weeks    Status  New    Target Date  12/23/17            Plan - 11/10/17 1409    Clinical Impression Statement  Pt with improved tolerance to seated knee flexion today v. supine flexion.  Limited by poor tolerance to manual and ROM exercises.  Recommended she bring her cane next session to properly fit and feel she will be ready for transition to [facility] in home after next visit.    PT Treatment/Interventions  ADLs/Self Care Home Management;Cryotherapy;Balance training;Neuromuscular re-education;Stair training;Gait training;Functional mobility training;Therapeutic activities;Therapeutic exercise;Manual  techniques;Vasopneumatic Device;Taping;Passive range of motion;Scar mobilization;Patient/family education    PT Next Visit Plan  (Hx of CX: no heat or e-stim) review/ update HEP, knee ROM, quad/ glute activation, vaso for pain / edema, continue gait training with cane    Consulted and Agree with Plan of Care  Patient       Patient will benefit from skilled therapeutic intervention in order to improve the following deficits and impairments:  Abnormal gait, Pain, Difficulty walking, Decreased activity tolerance, Increased edema, Postural dysfunction, Improper body mechanics, Decreased range of motion, Decreased strength, Decreased endurance, Hypomobility  Visit Diagnosis: Chronic pain of right knee  Muscle weakness (generalized)  Other abnormalities of gait and mobility  Localized edema     Problem List Patient Active Problem List   Diagnosis Date Noted  .  OA (osteoarthritis) of knee 10/24/2017  . Chronic pain of right knee 06/17/2017  . Osteoporosis 07/30/2016  . Iron deficiency anemia due to chronic blood loss 01/07/2016  . Malabsorption of iron 01/07/2016  . Postoperative state 01/17/2015  . HOCM (hypertrophic obstructive cardiomyopathy) ([place]) 01/09/2014  . Breast cancer ([place]) 01/09/2014  . Coronary atherosclerosis of native coronary artery 12/20/2013  . CAD (coronary artery disease)   . Hyperlipidemia   . Unstable angina ([place]) 12/19/2013  . Breast cancer of upper-outer quadrant of left female breast ([place]) 11/05/2013  . DCIS (ductal carcinoma in situ) of breast 10/26/2013  . Hypertension 04/05/2012  . Cardiac murmur 04/05/2012  . UNSPECIFIED HEARING LOSS 08/07/2010  . LOW BACK PAIN 04/22/2009  . CONSTIPATION 11/04/2008  . COLONIC POLYPS, HX OF 11/01/2008  . ALLERGIC RHINITIS 06/27/2007  . Hypothyroidism 06/22/2007      Laureen Abrahams, PT, DPT 11/10/17 2:11 PM    [facility] [address] [place],  [place], [address] Phone: [phone]   Fax:  [phone]  Name: DESHANA ROMINGER MRN: [id number] Date of Birth: April 11, 1935

## 2017-11-13 DIAGNOSIS — Z96651 Presence of right artificial knee joint: Secondary | ICD-10-CM | POA: Insufficient documentation

## 2017-11-15 ENCOUNTER — Ambulatory Visit: Payer: Medicare Other | Admitting: Physical Therapy

## 2017-11-17 ENCOUNTER — Encounter: Payer: Self-pay | Admitting: Physical Therapy

## 2017-11-17 ENCOUNTER — Ambulatory Visit: Payer: Medicare Other | Admitting: Physical Therapy

## 2017-11-17 DIAGNOSIS — M25561 Pain in right knee: Principal | ICD-10-CM

## 2017-11-17 DIAGNOSIS — M6281 Muscle weakness (generalized): Secondary | ICD-10-CM

## 2017-11-17 DIAGNOSIS — R2689 Other abnormalities of gait and mobility: Secondary | ICD-10-CM

## 2017-11-17 DIAGNOSIS — R600 Localized edema: Secondary | ICD-10-CM

## 2017-11-17 DIAGNOSIS — G8929 Other chronic pain: Secondary | ICD-10-CM

## 2017-11-17 NOTE — Therapy (Signed)
[place], [place], [address] Phone: [phone]   Fax:  [phone]  Physical Therapy Treatment  Patient Details  Name: Kim [name] MRN: [id number] Date of Birth: Dec 11, 1934 Referring Provider: Gaynelle Arabian MD   Encounter Date: 11/17/2017  PT End of Session - 11/17/17 1538    Visit Number  5    Number of Visits  17    Date for PT Re-Evaluation  12/23/17    Authorization Type  MCR    PT Start Time  1457    PT Stop Time  1552    PT Time Calculation (min)  55 min    Equipment Utilized During Treatment  Gait belt    Activity Tolerance  Patient tolerated treatment well    Behavior During Therapy  [facility] for tasks assessed/performed       Past Medical History:  Diagnosis Date  . Allergic rhinitis   . Allergy   . Arthritis    hands  . Breast cancer of upper-outer quadrant of left female breast ([place]) 11/08/13   finished chemo and radiation 2015  . CAD (coronary artery disease)    a. 12/2013 Cath/PCI: LM nl, LAD 20p, 60m, 30d, D1 30p, LCX 20p, 98m, OM1 20, Mo2 40p, RCA 30p, 39m (4.0x16 Promus Premier DES), 20d, RPL 30, RPDA 70p, 40m, EF 65-70%.  . Diverticulosis   . HOH (hard of hearing)    bilateral, wears hearing aids  . Hx of adenomatous colonic polyps 02/1999  . Hyperlipidemia   . Hypertension   . Hypothyroidism   . Iron deficiency anemia due to chronic blood loss 01/07/2016  . Low back pain    gets ESI from Dr. Rennis Harding  . Malabsorption of iron 01/07/2016  . Myocardial infarction ([place]) 12/2013   very mild-with first chemo -placed stent x1  . S/P radiation therapy 05/14/2014-06/26/2014   1) Left Breast  / 50 Gy in 25 fractions, 2) Left Supraclavicular fossa/ 47.5 Gy in 25 fractions, 3) Left Posterior Axillary boost / 4.825 Gy in 25 fractions, 4) Left Breast boost / 10 Gy in 5 fractions   . SVD (spontaneous vaginal delivery)    x 2  . Tubulovillous adenoma of colon 12/2009   with HGD  . Urinary tract  infection   . Wears hearing aid    left and right     Past Surgical History:  Procedure Laterality Date  . ABDOMINAL HYSTERECTOMY    . ANTERIOR AND POSTERIOR REPAIR N/A 01/17/2015   Procedure: CYSTOCELE REPAIR ;  Surgeon: Princess Bruins, MD;  Location: [facility];  Service: Gynecology;  Laterality: N/A;  . BILATERAL SALPINGOOPHORECTOMY     see Laurin Coder NP for GYN exams  . BREAST LUMPECTOMY WITH NEEDLE LOCALIZATION AND AXILLARY LYMPH NODE DISSECTION Left 11/05/2013   Procedure: LEFT BREAST LUMPECTOMY WITH NEEDLE LOCALIZATION and axillary lymph Node Dissection;  Surgeon: Edward Jolly, MD;  Location: [facility];  Service: General;  Laterality: Left;  . BREAST SURGERY  11/2013   left lumpectomy  . CATARACT EXTRACTION W/ INTRAOCULAR LENS  IMPLANT, BILATERAL  2012   bilateral  . COLONOSCOPY  11/04/2015   per Dr. Fuller Plan, adenomatous polyps, no repeats planned   . EYE SURGERY  11/22/2006   cataracts, bilateral, intraocular lens implant  . LEFT HEART CATHETERIZATION WITH CORONARY ANGIOGRAM N/A 12/19/2013   Procedure: LEFT HEART CATHETERIZATION WITH CORONARY ANGIOGRAM;  Surgeon: Burnell Blanks, MD;  Location: [facility] CATH LAB;  Service: Cardiovascular;  Laterality: N/A;  . lumbar epidural steroid injection     lumber spinal stenosis  . POLYPECTOMY    . PORTACATH PLACEMENT Right 12/11/2013   Procedure: INSERTION PORT-A-CATH;  Surgeon: Edward Jolly, MD;  Location: [place];  Service: General;  Laterality: Right;  Subclavian Vein;   . TOTAL KNEE ARTHROPLASTY Right 10/24/2017   Procedure: TOTAL RIGHT KNEE ARTHROPLASTY;  Surgeon: Gaynelle Arabian, MD;  Location: [facility];  Service: Orthopedics;  Laterality: Right;    There were no vitals filed for this visit.  Subjective Assessment - 11/17/17 1505    Subjective  brought her cane today and wants me to adjust to proper height.      Patient Stated Goals  get back to walking and out of pain.     Currently in  Pain?  Yes    Pain Score  4     Pain Location  Knee    Pain Orientation  Right    Pain Descriptors / Indicators  Discomfort    Pain Type  Surgical pain;Acute pain    Pain Onset  In the past 7 days    Pain Frequency  Constant    Aggravating Factors   walking, lying    Pain Relieving Factors  medication                      [facility] Adult PT Treatment/Exercise - 11/17/17 1507      Ambulation/Gait   Ambulation/Gait  Yes    Ambulation/Gait Assistance  5: Supervision    Ambulation Distance (Feet)  185 Feet    Assistive device  Straight cane    Gait Pattern  Step-to pattern;Decreased stride length;Decreased stance time - right;Decreased step length - left;Trendelenburg;Antalgic;Trunk flexed;Decreased trunk rotation    Gait Comments  still needing min cues for sequencing      Knee/Hip Exercises: Stretches   Passive Hamstring Stretch  Right;3 reps;30 seconds seated      Knee/Hip Exercises: Aerobic   Recumbent Bike  partial revolutions x 8 min      Knee/Hip Exercises: Machines for Strengthening   Cybex Knee Extension  5# RLE x 10      Knee/Hip Exercises: Standing   Forward Step Up  10 reps;Both;Hand Hold: 1;Step Height: 6" heavy reliance on UE      Knee/Hip Exercises: Seated   Other Seated Knee/Hip Exercises  seated knee flexion to work on flexion ROM 5 x 30-60 sec      Vasopneumatic   Number Minutes Vasopneumatic   15 minutes    Vasopnuematic Location   Knee    Vasopneumatic Pressure  Low    Vasopneumatic Temperature   34               PT Short Term Goals - 10/28/17 0854      PT SHORT TERM GOAL #1   Title  pt to be I with inital HEP     Time  4    Period  Weeks    Status  New    Target Date  11/25/17      PT SHORT TERM GOAL #2   Title  pt to verbalize / demo techniques to reduce pain and edema via RICE and HEP     Time  4    Period  Weeks    Status  New    Target Date  11/25/17      PT SHORT TERM GOAL #3   Title  pt to improve  R knee ROM AAROM  to >/= 90 degrees and </= -10 degrees to promote therapuetic progression    Time  4    Period  Weeks    Status  New    Target Date  11/25/17        PT Long Term Goals - 10/28/17 0856      PT LONG TERM GOAL #1   Title  increaes R knee ROM to >/= 120 degrees and -5 degrees to assist with functional and efficient gait pattern     Time  8    Period  Weeks    Status  New    Target Date  12/23/17      PT LONG TERM GOAL #2   Title  Increase RLE strength to >/= 4+/5 to promote stability and safety with walking/ standing     Time  8    Period  Weeks    Status  New    Target Date  12/23/17      PT LONG TERM GOAL #3   Title  pt to be able to walk/ stand for >/= 45 min and navigate up/down >/= 10 steps with LRAD for endurance required for community ambulation    Time  8    Period  Weeks    Status  New    Target Date  12/23/17      PT LONG TERM GOAL #4   Title  increase FOTO score to </= 53% limited to demo improvement in function    Time  8    Period  Weeks    Status  New    Target Date  12/23/17      PT LONG TERM GOAL #5   Title  pt be I with all HEP given as of last visit to maintain and progress current level of function    Time  8    Period  Weeks    Status  New    Target Date  12/23/17            Plan - 11/17/17 1539    Clinical Impression Statement  Pt tolerated session well today, but continues to be self limiting with pain with ROM activities.  Will continue to benefit from PT to maximize function.    PT Treatment/Interventions  ADLs/Self Care Home Management;Cryotherapy;Balance training;Neuromuscular re-education;Stair training;Gait training;Functional mobility training;Therapeutic activities;Therapeutic exercise;Manual techniques;Vasopneumatic Device;Taping;Passive range of motion;Scar mobilization;Patient/family education    PT Next Visit Plan  (Hx of CX: no heat or e-stim) review/ update HEP, knee ROM, quad/ glute activation, vaso for pain / edema, continue  gait training with cane    Consulted and Agree with Plan of Care  Patient       Patient will benefit from skilled therapeutic intervention in order to improve the following deficits and impairments:  Abnormal gait, Pain, Difficulty walking, Decreased activity tolerance, Increased edema, Postural dysfunction, Improper body mechanics, Decreased range of motion, Decreased strength, Decreased endurance, Hypomobility  Visit Diagnosis: Chronic pain of right knee  Muscle weakness (generalized)  Other abnormalities of gait and mobility  Localized edema     Problem List Patient Active Problem List   Diagnosis Date Noted  . OA (osteoarthritis) of knee 10/24/2017  . Chronic pain of right knee 06/17/2017  . Osteoporosis 07/30/2016  . Iron deficiency anemia due to chronic blood loss 01/07/2016  . Malabsorption of iron 01/07/2016  . Postoperative state 01/17/2015  . HOCM (hypertrophic obstructive cardiomyopathy) ([place]) 01/09/2014  . Breast cancer ([place]) 01/09/2014  .  Coronary atherosclerosis of native coronary artery 12/20/2013  . CAD (coronary artery disease)   . Hyperlipidemia   . Unstable angina ([place]) 12/19/2013  . Breast cancer of upper-outer quadrant of left female breast ([place]) 11/05/2013  . DCIS (ductal carcinoma in situ) of breast 10/26/2013  . Hypertension 04/05/2012  . Cardiac murmur 04/05/2012  . UNSPECIFIED HEARING LOSS 08/07/2010  . LOW BACK PAIN 04/22/2009  . CONSTIPATION 11/04/2008  . COLONIC POLYPS, HX OF 11/01/2008  . ALLERGIC RHINITIS 06/27/2007  . Hypothyroidism 06/22/2007      Laureen Abrahams, PT, DPT 11/17/17 3:40 PM    [facility] [address] [place], [place], [address] Phone: [phone]   Fax:  [phone]  Name: Kim [name] MRN: [id number] Date of Birth: [birth year]

## 2017-11-22 ENCOUNTER — Ambulatory Visit: Payer: Medicare Other | Admitting: Physical Therapy

## 2017-11-22 ENCOUNTER — Encounter: Payer: Self-pay | Admitting: Physical Therapy

## 2017-11-22 DIAGNOSIS — M25561 Pain in right knee: Principal | ICD-10-CM

## 2017-11-22 DIAGNOSIS — G8929 Other chronic pain: Secondary | ICD-10-CM

## 2017-11-22 DIAGNOSIS — R600 Localized edema: Secondary | ICD-10-CM

## 2017-11-22 DIAGNOSIS — M6281 Muscle weakness (generalized): Secondary | ICD-10-CM

## 2017-11-22 DIAGNOSIS — R2689 Other abnormalities of gait and mobility: Secondary | ICD-10-CM

## 2017-11-22 NOTE — Therapy (Signed)
[place], [place], [address] Phone: [phone]   Fax:  [phone]  Physical Therapy Treatment  Patient Details  Name: Kim [name] MRN: [id number] Date of Birth: 1935/08/14 Referring Provider: Gaynelle Arabian MD   Encounter Date: 11/22/2017  PT End of Session - 11/22/17 1137    Visit Number  6    Number of Visits  17    Date for PT Re-Evaluation  12/23/17    Authorization Type  MCR    PT Start Time  1138    PT Stop Time  1225    PT Time Calculation (min)  47 min    Activity Tolerance  Patient tolerated treatment well    Behavior During Therapy  [facility] for tasks assessed/performed       Past Medical History:  Diagnosis Date  . Allergic rhinitis   . Allergy   . Arthritis    hands  . Breast cancer of upper-outer quadrant of left female breast ([place]) 11/08/13   finished chemo and radiation 2015  . CAD (coronary artery disease)    a. 12/2013 Cath/PCI: LM nl, LAD 20p, 58m, 30d, D1 30p, LCX 20p, 24m, OM1 20, Mo2 40p, RCA 30p, 33m (4.0x16 Promus Premier DES), 20d, RPL 30, RPDA 70p, 38m, EF 65-70%.  . Diverticulosis   . HOH (hard of hearing)    bilateral, wears hearing aids  . Hx of adenomatous colonic polyps 02/1999  . Hyperlipidemia   . Hypertension   . Hypothyroidism   . Iron deficiency anemia due to chronic blood loss 01/07/2016  . Low back pain    gets ESI from Dr. Rennis Harding  . Malabsorption of iron 01/07/2016  . Myocardial infarction ([place]) 12/2013   very mild-with first chemo -placed stent x1  . S/P radiation therapy 05/14/2014-06/26/2014   1) Left Breast  / 50 Gy in 25 fractions, 2) Left Supraclavicular fossa/ 47.5 Gy in 25 fractions, 3) Left Posterior Axillary boost / 4.825 Gy in 25 fractions, 4) Left Breast boost / 10 Gy in 5 fractions   . SVD (spontaneous vaginal delivery)    x 2  . Tubulovillous adenoma of colon 12/2009   with HGD  . Urinary tract infection   . Wears hearing aid    left and right      Past Surgical History:  Procedure Laterality Date  . ABDOMINAL HYSTERECTOMY    . ANTERIOR AND POSTERIOR REPAIR N/A 01/17/2015   Procedure: CYSTOCELE REPAIR ;  Surgeon: Princess Bruins, MD;  Location: [facility];  Service: Gynecology;  Laterality: N/A;  . BILATERAL SALPINGOOPHORECTOMY     see Laurin Coder NP for GYN exams  . BREAST LUMPECTOMY WITH NEEDLE LOCALIZATION AND AXILLARY LYMPH NODE DISSECTION Left 11/05/2013   Procedure: LEFT BREAST LUMPECTOMY WITH NEEDLE LOCALIZATION and axillary lymph Node Dissection;  Surgeon: Edward Jolly, MD;  Location: [facility];  Service: General;  Laterality: Left;  . BREAST SURGERY  11/2013   left lumpectomy  . CATARACT EXTRACTION W/ INTRAOCULAR LENS  IMPLANT, BILATERAL  2012   bilateral  . COLONOSCOPY  11/04/2015   per Dr. Fuller Plan, adenomatous polyps, no repeats planned   . EYE SURGERY  11/22/2006   cataracts, bilateral, intraocular lens implant  . LEFT HEART CATHETERIZATION WITH CORONARY ANGIOGRAM N/A 12/19/2013   Procedure: LEFT HEART CATHETERIZATION WITH CORONARY ANGIOGRAM;  Surgeon: Burnell Blanks, MD;  Location: [facility] CATH LAB;  Service: Cardiovascular;  Laterality: N/A;  . lumbar epidural steroid injection  lumber spinal stenosis  . POLYPECTOMY    . PORTACATH PLACEMENT Right 12/11/2013   Procedure: INSERTION PORT-A-CATH;  Surgeon: Edward Jolly, MD;  Location: [place];  Service: General;  Laterality: Right;  Subclavian Vein;   . TOTAL KNEE ARTHROPLASTY Right 10/24/2017   Procedure: TOTAL RIGHT KNEE ARTHROPLASTY;  Surgeon: Gaynelle Arabian, MD;  Location[facility];  Service: Orthopedics;  Laterality: Right;    There were no vitals filed for this visit.  Subjective Assessment - 11/22/17 1133    Subjective  "I feeling alittle off today, I think it is more from medication"     Currently in Pain?  Yes    Pain Score  4     Pain Orientation  Right    Pain Type  Surgical pain    Pain Frequency   Intermittent         [facility] PT Assessment - 11/22/17 1152      AROM   Right Knee Extension  6    Right Knee Flexion  109 115  following manual treatment                  [facility] Adult PT Treatment/Exercise - 11/22/17 1145      Knee/Hip Exercises: Stretches   Passive Hamstring Stretch  2 reps;30 seconds;Right    Quad Stretch  2 reps;30 seconds      Knee/Hip Exercises: Aerobic   Stationary Bike  L1 x 6 min able to get full revolutions today      Knee/Hip Exercises: Standing   Forward Step Up  10 reps;Both;Hand Hold: 1;Step Height: 6"    Step Down  Step Height: 4";10 reps;2 sets;Right    Gait Training  utilizing exaggerated heel strike/ toe off for functional gait pattern  2 x 50 ft with [facility]    Other Standing Knee Exercises  marching in place 2 x 10 in //      Knee/Hip Exercises: Seated   Long Arc Quad  Right;15 reps;Weights    Long Arc Quad Weight  2 lbs.    Other Seated Knee/Hip Exercises  seated knee flexion to work on flexion ROM 5 x 30-60 sec      Knee/Hip Exercises: Supine   Quad Sets  1 set;10 reps w    Straight Leg Raises  Right;10 reps;2 sets      Manual Therapy   Joint Mobilization  AP grade 3 with pt bending knee between treatment    Passive ROM  knee flexion with towel in popliteal space, using hamstring curl to promote flexion and holding ROM once pt relaxes pushing further into flexion between activation.           Balance Exercises - 11/22/17 1229      Balance Exercises: Standing   Standing Eyes Opened  2 reps;30 secs    Standing Eyes Closed  Narrow base of support (BOS);2 reps;30 secs          PT Short Term Goals - 10/28/17 0854      PT SHORT TERM GOAL #1   Title  pt to be I with inital HEP     Time  4    Period  Weeks    Status  New    Target Date  11/25/17      PT SHORT TERM GOAL #2   Title  pt to verbalize / demo techniques to reduce pain and edema via RICE and HEP     Time  4  Period  Weeks    Status  New    Target Date   11/25/17      PT SHORT TERM GOAL #3   Title  pt to improve R knee ROM AAROM to >/= 90 degrees and </= -10 degrees to promote therapuetic progression    Time  4    Period  Weeks    Status  New    Target Date  11/25/17        PT Long Term Goals - 10/28/17 0856      PT LONG TERM GOAL #1   Title  increaes R knee ROM to >/= 120 degrees and -5 degrees to assist with functional and efficient gait pattern     Time  8    Period  Weeks    Status  New    Target Date  12/23/17      PT LONG TERM GOAL #2   Title  Increase RLE strength to >/= 4+/5 to promote stability and safety with walking/ standing     Time  8    Period  Weeks    Status  New    Target Date  12/23/17      PT LONG TERM GOAL #3   Title  pt to be able to walk/ stand for >/= 45 min and navigate up/down >/= 10 steps with LRAD for endurance required for community ambulation    Time  8    Period  Weeks    Status  New    Target Date  12/23/17      PT LONG TERM GOAL #4   Title  increase FOTO score to </= 53% limited to demo improvement in function    Time  8    Period  Weeks    Status  New    Target Date  12/23/17      PT LONG TERM GOAL #5   Title  pt be I with all HEP given as of last visit to maintain and progress current level of function    Time  8    Period  Weeks    Status  New    Target Date  12/23/17            Plan - 11/22/17 1233    Clinical Impression Statement  pt was able to do all activies in the session today. Continued to self limit due to pain. continued ROM activities and strengthening which she required verbal cues to continue with exericse. She improve ROM from 6 - 115 (following manual). she declined modalities today.     PT Treatment/Interventions  ADLs/Self Care Home Management;Cryotherapy;Balance training;Neuromuscular re-education;Stair training;Gait training;Functional mobility training;Therapeutic activities;Therapeutic exercise;Manual techniques;Vasopneumatic Device;Taping;Passive range  of motion;Scar mobilization;Patient/family education       Patient will benefit from skilled therapeutic intervention in order to improve the following deficits and impairments:  Abnormal gait, Pain, Difficulty walking, Decreased activity tolerance, Increased edema, Postural dysfunction, Improper body mechanics, Decreased range of motion, Decreased strength, Decreased endurance, Hypomobility  Visit Diagnosis: Chronic pain of right knee  Muscle weakness (generalized)  Other abnormalities of gait and mobility  Localized edema     Problem List Patient Active Problem List   Diagnosis Date Noted  . OA (osteoarthritis) of knee 10/24/2017  . Chronic pain of right knee 06/17/2017  . Osteoporosis 07/30/2016  . Iron deficiency anemia due to chronic blood loss 01/07/2016  . Malabsorption of iron 01/07/2016  . Postoperative state 01/17/2015  . HOCM (hypertrophic obstructive cardiomyopathy) (  [place]) 01/09/2014  . Breast cancer ([place]) 01/09/2014  . Coronary atherosclerosis of native coronary artery 12/20/2013  . CAD (coronary artery disease)   . Hyperlipidemia   . Unstable angina ([place]) 12/19/2013  . Breast cancer of upper-outer quadrant of left female breast ([place]) 11/05/2013  . DCIS (ductal carcinoma in situ) of breast 10/26/2013  . Hypertension 04/05/2012  . Cardiac murmur 04/05/2012  . UNSPECIFIED HEARING LOSS 08/07/2010  . LOW BACK PAIN 04/22/2009  . CONSTIPATION 11/04/2008  . COLONIC POLYPS, HX OF 11/01/2008  . ALLERGIC RHINITIS 06/27/2007  . Hypothyroidism 06/22/2007   Starr Lake PT, DPT, LAT, ATC  11/22/17  12:37 PM      [facility] [address] [place], [place], [address] Phone: [phone]   Fax:  [phone]  Name: JAELIANA LOCOCO MRN: [id number] Date of Birth: 20-Dec-1934

## 2017-11-24 ENCOUNTER — Ambulatory Visit: Payer: Medicare Other | Admitting: Physical Therapy

## 2017-11-29 ENCOUNTER — Ambulatory Visit: Payer: Medicare Other | Admitting: Physical Therapy

## 2017-12-01 ENCOUNTER — Other Ambulatory Visit: Payer: Self-pay | Admitting: Family Medicine

## 2017-12-01 ENCOUNTER — Encounter: Payer: Self-pay | Admitting: Physical Therapy

## 2017-12-01 ENCOUNTER — Ambulatory Visit: Payer: Medicare Other | Admitting: Physical Therapy

## 2017-12-01 DIAGNOSIS — M6281 Muscle weakness (generalized): Secondary | ICD-10-CM

## 2017-12-01 DIAGNOSIS — M25561 Pain in right knee: Secondary | ICD-10-CM | POA: Diagnosis not present

## 2017-12-01 DIAGNOSIS — G8929 Other chronic pain: Secondary | ICD-10-CM

## 2017-12-01 DIAGNOSIS — R2689 Other abnormalities of gait and mobility: Secondary | ICD-10-CM

## 2017-12-01 DIAGNOSIS — Z9889 Other specified postprocedural states: Secondary | ICD-10-CM

## 2017-12-01 DIAGNOSIS — R600 Localized edema: Secondary | ICD-10-CM

## 2017-12-01 NOTE — Therapy (Signed)
[place], [place], [address] Phone: [phone]   Fax:  [phone]  Physical Therapy Treatment / Discharge Summary  Patient Details  Name: Kim [name] MRN: [id number] Date of Birth: [birth year] Referring Provider: Gaynelle Arabian MD   Encounter Date: 12/01/2017  PT End of Session - 12/01/17 1409    Visit Number  7    Number of Visits  17    Date for PT Re-Evaluation  12/23/17    PT Start Time  1950    PT Stop Time  1446    PT Time Calculation (min)  41 min    Activity Tolerance  Patient tolerated treatment well    Behavior During Therapy  [facility] for tasks assessed/performed       Past Medical History:  Diagnosis Date  . Allergic rhinitis   . Allergy   . Arthritis    hands  . Breast cancer of upper-outer quadrant of left female breast ([place]) 11/08/13   finished chemo and radiation 2015  . CAD (coronary artery disease)    a. 12/2013 Cath/PCI: LM nl, LAD 20p, 34m 30d, D1 30p, LCX 20p, 367mOM1 20, Mo2 40p, RCA 30p, 9927m.0x16 Promus Premier DES), 20d, RPL 30, RPDA 70p, 45m60m 65-70%.  . Diverticulosis   . HOH (hard of hearing)    bilateral, wears hearing aids  . Hx of adenomatous colonic polyps 02/1999  . Hyperlipidemia   . Hypertension   . Hypothyroidism   . Iron deficiency anemia due to chronic blood loss 01/07/2016  . Low back pain    gets ESI from Dr. Max Rennis HardingMalabsorption of iron 01/07/2016  . Myocardial infarction (HCC)[place]2015   very mild-with first chemo -placed stent x1  . S/P radiation therapy 05/14/2014-06/26/2014   1) Left Breast  / 50 Gy in 25 fractions, 2) Left Supraclavicular fossa/ 47.5 Gy in 25 fractions, 3) Left Posterior Axillary boost / 4.825 Gy in 25 fractions, 4) Left Breast boost / 10 Gy in 5 fractions   . SVD (spontaneous vaginal delivery)    x 2  . Tubulovillous adenoma of colon 12/2009   with HGD  . Urinary tract infection   . Wears hearing aid    left and right      Past Surgical History:  Procedure Laterality Date  . ABDOMINAL HYSTERECTOMY    . ANTERIOR AND POSTERIOR REPAIR N/A 01/17/2015   Procedure: CYSTOCELE REPAIR ;  Surgeon: MariPrincess Bruins;  [facility];  Service: Gynecology;  Laterality: N/A;  . BILATERAL SALPINGOOPHORECTOMY     see BethLaurin Coderfor GYN exams  . BREAST LUMPECTOMY WITH NEEDLE LOCALIZATION AND AXILLARY LYMPH NODE DISSECTION Left 11/05/2013   Procedure: LEFT BREAST LUMPECTOMY WITH NEEDLE LOCALIZATION and axillary lymph Node Dissection;  Surgeon: BenjEdward Jolly;  [facility] [place];  Laterality: Left;  . BREAST SURGERY  11/2013   left lumpectomy  . CATARACT EXTRACTION W/ INTRAOCULAR LENS  IMPLANT, BILATERAL  2012   bilateral  . COLONOSCOPY  11/04/2015   per Dr. StarFuller Planenomatous polyps, no repeats planned   . EYE SURGERY  11/22/2006   cataracts, bilateral, intraocular lens implant  . LEFT HEART CATHETERIZATION WITH CORONARY ANGIOGRAM N/A 12/19/2013   Procedure: LEFT HEART CATHETERIZATION WITH CORONARY ANGIOGRAM;  Surgeon: ChriBurnell Blanks;  [facility];  Service: Cardiovascular;  Laterality: N/A;  . lumbar epidural steroid injection     lumber  spinal stenosis  . POLYPECTOMY    . PORTACATH PLACEMENT Right 12/11/2013   Procedure: INSERTION PORT-A-CATH;  Surgeon: Edward Jolly, MD;  Location: [place];  Service: General;  Laterality: Right;  Subclavian Vein;   . TOTAL KNEE ARTHROPLASTY Right 10/24/2017   Procedure: TOTAL RIGHT KNEE ARTHROPLASTY;  Surgeon: Gaynelle Arabian, MD;  Location: [facility];  Service: Orthopedics;  Laterality: Right;    There were no vitals filed for this visit.  Subjective Assessment - 12/01/17 1405    Subjective  "I saw the doctor was happy with everything and said I don't need to see him for another 5 weeks. My knee did give me a fit the other night and i couldn't sleep"     Currently in Pain?  Yes    Pain  Score  5     Pain Orientation  Right    Pain Descriptors / Indicators  Aching;Sore    Pain Type  Surgical pain    Aggravating Factors   standing/ walking for long periods of time    Pain Relieving Factors  meds         [facility] PT Assessment - 12/01/17 1420      Observation/Other Assessments   Focus on Therapeutic Outcomes (FOTO)   39% limited      AROM   Right Knee Extension  5    Right Knee Flexion  120      Strength   Right/Left Knee  Right    Right Knee Flexion  4-/5    Right Knee Extension  4/5                  [facility] Adult PT Treatment/Exercise - 12/01/17 1412      Knee/Hip Exercises: Aerobic   Stationary Bike  L1 x 6 min full revolutions      Knee/Hip Exercises: Seated   Long Arc Quad  2 sets;15 reps    Long Arc Quad Weight  3 lbs.    Hamstring Curl  Right;15 reps;Strengthening      Knee/Hip Exercises: Supine   Quad Sets  1 set;10 reps    Heel Slides  1 set;10 reps;Right    Straight Leg Raises  2 sets;15 reps cues to keep knee straight      Knee/Hip Exercises: Sidelying   Hip ABduction  Right;2 sets;10 reps             PT Education - 12/01/17 1454    Education provided  Yes    Education Details  reviewed previously provided HEP, and updated HEP. benefits to continue with HEP to maintain and progress level of function.     Person(s) Educated  Patient    Methods  Explanation;Verbal cues;Handout    Comprehension  Verbalized understanding;Verbal cues required       PT Short Term Goals - 12/01/17 1437      PT SHORT TERM GOAL #1   Title  pt to be I with inital HEP     Time  4    Period  Weeks    Status  Achieved      PT SHORT TERM GOAL #2   Title  pt to verbalize / demo techniques to reduce pain and edema via RICE and HEP     Time  4    Period  Weeks    Status  Achieved      PT SHORT TERM GOAL #3   Title  pt to improve R knee ROM AAROM to >/=  90 degrees and </= -10 degrees to promote therapuetic progression    Period  Weeks     Status  Achieved        PT Long Term Goals - 12/01/17 1438      PT LONG TERM GOAL #1   Title  increaes R knee ROM to >/= 120 degrees and -5 degrees to assist with functional and efficient gait pattern     Time  8    Period  Weeks    Status  Achieved      PT LONG TERM GOAL #2   Title  Increase RLE strength to >/= 4+/5 to promote stability and safety with walking/ standing     Time  8    Period  Weeks    Status  Partially Met      PT LONG TERM GOAL #3   Title  pt to be able to walk/ stand for >/= 45 min and navigate up/down >/= 10 steps with LRAD for endurance required for community ambulation    Time  8    Period  Weeks    Status  Achieved      PT LONG TERM GOAL #4   Title  increase FOTO score to </= 53% limited to demo improvement in function    Time  8    Period  Weeks    Status  Achieved      PT LONG TERM GOAL #5   Title  pt be I with all HEP given as of last visit to maintain and progress current level of function    Time  8    Period  Weeks    Status  Achieved            Plan - 12/01/17 1449    Clinical Impression Statement  pt reports 5/10 pain which she is unsure why becuase she was feeling good yestreday. she has improved her knee ROM 5 to 120 degrees today and improved knee strength. She continues to report intermittent pain, and stiffness in the R knee.  pt feels she no longer requires physical therapy. she met or partially met all goals today. reviewed previously  provided HEP and updated for continued progression. pt reports she is able to maintain and progress her current level of function independently and will be discharged from PT today.     PT Treatment/Interventions  ADLs/Self Care Home Management;Cryotherapy;Balance training;Neuromuscular re-education;Stair training;Gait training;Functional mobility training;Therapeutic activities;Therapeutic exercise;Manual techniques;Vasopneumatic Device;Taping;Passive range of motion;Scar mobilization;Patient/family  education    PT Next Visit Plan  DX    PT Home Exercise Plan  previously provided HEP from MD, quad set, heel slide with strap, ankle pumps.     Consulted and Agree with Plan of Care  Patient       Patient will benefit from skilled therapeutic intervention in order to improve the following deficits and impairments:  Abnormal gait, Pain, Difficulty walking, Decreased activity tolerance, Increased edema, Postural dysfunction, Improper body mechanics, Decreased range of motion, Decreased strength, Decreased endurance, Hypomobility  Visit Diagnosis: Chronic pain of right knee  Muscle weakness (generalized)  Other abnormalities of gait and mobility  Localized edema     Problem List Patient Active Problem List   Diagnosis Date Noted  . OA (osteoarthritis) of knee 10/24/2017  . Chronic pain of right knee 06/17/2017  . Osteoporosis 07/30/2016  . Iron deficiency anemia due to chronic blood loss 01/07/2016  . Malabsorption of iron 01/07/2016  . Postoperative state 01/17/2015  . HOCM (  hypertrophic obstructive cardiomyopathy) ([place]) 01/09/2014  . Breast cancer ([name]) 01/09/2014  . Coronary atherosclerosis of native coronary artery 12/20/2013  . CAD (coronary artery disease)   . Hyperlipidemia   . Unstable angina ([place]) 12/19/2013  . Breast cancer of upper-outer quadrant of left female breast ([place]) 11/05/2013  . DCIS (ductal carcinoma in situ) of breast 10/26/2013  . Hypertension 04/05/2012  . Cardiac murmur 04/05/2012  . UNSPECIFIED HEARING LOSS 08/07/2010  . LOW BACK PAIN 04/22/2009  . CONSTIPATION 11/04/2008  . COLONIC POLYPS, HX OF 11/01/2008  . ALLERGIC RHINITIS 06/27/2007  . Hypothyroidism 06/22/2007    [place] 12/01/2017, 2:59 PM  [facility] [address] [place], [place], [address] Phone: [phone]   Fax:  [phone]  Name: SHERMA VANMETRE MRN: [id number] Date of Birth: 03-24-1935       PHYSICAL  THERAPY DISCHARGE SUMMARY  Visits from Start of Care: 7  Current functional level related to goals / functional outcomes: See goals, FOTO 39% limited   Remaining deficits: Intermittent R knee pain/ soreness and mild limitation of strength. Pt reported she feels she is ready for discharge.     Education / Equipment: HEP, balance, posture, RICE  Plan: Patient agrees to discharge.  Patient goals were partially met. Patient is being discharged due to being pleased with the current functional level.  ?????          Sandy Haye PT, DPT, LAT, ATC  12/01/17  3:00 PM

## 2017-12-05 ENCOUNTER — Other Ambulatory Visit: Payer: Self-pay

## 2017-12-05 ENCOUNTER — Inpatient Hospital Stay (HOSPITAL_BASED_OUTPATIENT_CLINIC_OR_DEPARTMENT_OTHER): Payer: Medicare Other | Admitting: Hematology & Oncology

## 2017-12-05 ENCOUNTER — Encounter: Payer: Self-pay | Admitting: Hematology & Oncology

## 2017-12-05 ENCOUNTER — Inpatient Hospital Stay: Payer: Medicare Other | Attending: Hematology & Oncology

## 2017-12-05 VITALS — BP 139/73 | HR 82 | Temp 98.1°F | Resp 16 | Wt 153.0 lb

## 2017-12-05 DIAGNOSIS — C50412 Malignant neoplasm of upper-outer quadrant of left female breast: Secondary | ICD-10-CM

## 2017-12-05 DIAGNOSIS — D500 Iron deficiency anemia secondary to blood loss (chronic): Secondary | ICD-10-CM | POA: Diagnosis not present

## 2017-12-05 DIAGNOSIS — Z170 Estrogen receptor positive status [ER+]: Secondary | ICD-10-CM

## 2017-12-05 DIAGNOSIS — C779 Secondary and unspecified malignant neoplasm of lymph node, unspecified: Secondary | ICD-10-CM | POA: Diagnosis not present

## 2017-12-05 DIAGNOSIS — Z7901 Long term (current) use of anticoagulants: Secondary | ICD-10-CM | POA: Insufficient documentation

## 2017-12-05 DIAGNOSIS — C50912 Malignant neoplasm of unspecified site of left female breast: Secondary | ICD-10-CM | POA: Diagnosis not present

## 2017-12-05 LAB — CBC WITH DIFFERENTIAL (CANCER CENTER ONLY)
Basophils Absolute: 0 10*3/uL (ref 0.0–0.1)
Basophils Relative: 1 %
Eosinophils Absolute: 0.1 10*3/uL (ref 0.0–0.5)
Eosinophils Relative: 2 %
HCT: 39.7 % (ref 34.8–46.6)
Hemoglobin: 13.1 g/dL (ref 11.6–15.9)
Lymphocytes Relative: 14 %
Lymphs Abs: 0.7 10*3/uL — ABNORMAL LOW (ref 0.9–3.3)
MCH: 33.1 pg (ref 26.0–34.0)
MCHC: 33 g/dL (ref 32.0–36.0)
MCV: 100.3 fL (ref 81.0–101.0)
Monocytes Absolute: 0.4 10*3/uL (ref 0.1–0.9)
Monocytes Relative: 9 %
Neutro Abs: 3.5 10*3/uL (ref 1.5–6.5)
Neutrophils Relative %: 74 %
Platelet Count: 153 10*3/uL (ref 145–400)
RBC: 3.96 MIL/uL (ref 3.70–5.32)
RDW: 13.8 % (ref 11.1–15.7)
WBC Count: 4.7 10*3/uL (ref 3.9–10.0)

## 2017-12-05 LAB — CMP (CANCER CENTER ONLY)
ALT: 11 U/L (ref 10–47)
AST: 16 U/L (ref 11–38)
Albumin: 3.9 g/dL (ref 3.5–5.0)
Alkaline Phosphatase: 56 U/L (ref 26–84)
Anion gap: 11 (ref 5–15)
BUN: 19 mg/dL (ref 7–22)
CO2: 29 mmol/L (ref 18–33)
Calcium: 9.6 mg/dL (ref 8.0–10.3)
Chloride: 104 mmol/L (ref 98–108)
Creatinine: 1 mg/dL (ref 0.60–1.20)
Glucose, Bld: 111 mg/dL (ref 73–118)
Potassium: 3.9 mmol/L (ref 3.3–4.7)
Sodium: 144 mmol/L (ref 128–145)
Total Bilirubin: 0.9 mg/dL (ref 0.2–1.6)
Total Protein: 6.9 g/dL (ref 6.4–8.1)

## 2017-12-05 NOTE — Progress Notes (Signed)
Hematology and Oncology Follow Up Visit  Kim [name] [id number] [birth year] 83 y.o. 12/05/2017   Principle Diagnosis:  Stage IIA (T1N1M0) adenocarcinoma of the left breast-triple positive Iron deficiency anemia secondary to blood loss  Current Therapy:   Status post cycle 4 of Abraxane/Herceptin Maintenance Herceptin q 3wk - finish 03/25/2015 Prolia 60gm sq every 6 months - due in June 2018 Aromasin 25 mg by mouth daily  - start on 01/10/2017  Feraheme as needed-dose given today     Interim History:  Ms.  Kim [name] is back for followup.  She is having a lot of problems with her right knee.  She had operated on back in January.  It is still causing her quite a bit of pain.  She saw the orthopedist last week.  Not much really happened.  She is having a hard time sleeping because of the knee.  She has had no fever.  She is had no nausea or vomiting.  She has had no cough.  She has had no mouth sores.  It has been almost 3 years that she completed her Herceptin.  She is doing okay on Aromasin.  She is still on Eliquis.  She is on low-dose Eliquis.  She is doing well on this.  She is due for mammogram this week.  Overall, her performance status is ECOG 1.   Medications:  Current Outpatient Medications:  .  acetaminophen (TYLENOL) 650 MG CR tablet, Take 650-1,300 mg by mouth every 8 (eight) hours as needed for pain. , Disp: , Rfl:  .  apixaban (ELIQUIS) 2.5 MG TABS tablet, Take 1 tablet (2.5 mg total) by mouth 2 (two) times daily. Take Eliquis twice a day for two and a half more weeks following discharge from the hospital, then discontinue the Eliquis. Once the patient has completed the Eliquis, they may resume the 81 mg Aspirin., Disp: 39 tablet, Rfl: 0 .  ezetimibe (ZETIA) 10 MG tablet, Take 1 tablet (10 mg total) by mouth daily. (Patient not taking: Reported on 08/09/2017), Disp: 30 tablet, Rfl: 6 .  levothyroxine (SYNTHROID, LEVOTHROID) 112 MCG tablet, TAKE 1 TABLET BY MOUTH ONCE  DAILY (Patient taking differently: TAKE 112 mcg BY MOUTH ONCE DAILY), Disp: 90 tablet, Rfl: 1 .  metoprolol succinate (TOPROL-XL) 50 MG 24 hr tablet, TAKE 1 TABLET BY MOUTH 2 TIMES DAILY WITH OR IMMEDIATELY FOLLOWING A MEAL (Patient taking differently: TAKE 50mg  BY MOUTH once DAILY WITH OR IMMEDIATELY FOLLOWING A MEAL), Disp: 60 tablet, Rfl: 6 .  traMADol (ULTRAM) 50 MG tablet, Take 1-2 tablets (50-100 mg total) by mouth every 6 (six) hours as needed for moderate pain (not relieved by oxycodone)., Disp: 56 tablet, Rfl: 0  Allergies:  Allergies  Allergen Reactions  . Propoxyphene N-Acetaminophen Swelling    tongue swelling  . Levaquin [Levofloxacin] Hives  . Other   . Penicillins Swelling    Has patient had a PCN reaction causing immediate rash, facial/tongue/throat swelling, SOB or lightheadedness with hypotension: Yes Has patient had a PCN reaction causing severe rash involving mucus membranes or skin necrosis: No Has patient had a PCN reaction that required hospitalization: No Has patient had a PCN reaction occurring within the last 10 years: No If all of the above answers are "NO", then may proceed with Cephalosporin use.   Facial swelling  . Pravastatin Other (See Comments)    Bone and muscle pain Other reaction(s): Arthralgia (Joint Pain)  . Rosuvastatin Other (See Comments)    Bone and muscle pain Other  reaction(s): Arthralgia (Joint Pain)  . Atorvastatin Other (See Comments)    Joint pain Bone and muscle pain Other reaction(s): Arthralgia (Joint Pain)  . Cortisone Rash  . Evolocumab Other (See Comments)    Unsure Myalgias, fatigue, some itching and burning on her feet Other reaction(s): Arthralgia (Joint Pain)  . Myrbetriq [Mirabegron] Rash    Rash on face  . Pseudoephedrine Other (See Comments)    Unsure Other reaction(s): Flushing  . Pseudoephedrine Hcl Er Rash and Other (See Comments)    Face gets red  . Statins Other (See Comments)    Joint pain Joint pain      Past Medical History, Surgical history, Social history, and Family History were reviewed and updated.  Review of Systems: Review of Systems  Constitutional: Negative.   HENT: Negative.   Eyes: Negative.   Respiratory: Negative.   Cardiovascular: Negative.   Gastrointestinal: Negative.   Genitourinary: Negative.   Musculoskeletal: Positive for joint pain.  Skin: Negative.   Neurological: Negative.   Endo/Heme/Allergies: Negative.   Psychiatric/Behavioral: Negative.      Physical Exam:  weight is 153 lb (69.4 kg). Her oral temperature is 98.1 F (36.7 C). Her blood pressure is 139/73 and her pulse is 82. Her respiration is 16 and oxygen saturation is 100%.   Physical Exam  Constitutional: She is oriented to person, place, and time.  HENT:  Head: Normocephalic and atraumatic.  Mouth/Throat: Oropharynx is clear and moist.  Eyes: EOM are normal. Pupils are equal, round, and reactive to light.  Neck: Normal range of motion.  Cardiovascular: Normal rate, regular rhythm and normal heart sounds.  Pulmonary/Chest: Effort normal and breath sounds normal.  Abdominal: Soft. Bowel sounds are normal.  Musculoskeletal: Normal range of motion. She exhibits no edema, tenderness or deformity.  Lymphadenopathy:    She has no cervical adenopathy.  Neurological: She is alert and oriented to person, place, and time.  Skin: Skin is warm and dry. No rash noted. No erythema.  Psychiatric: She has a normal mood and affect. Her behavior is normal. Judgment and thought content normal.  Vitals reviewed.   Lab Results  Component Value Date   WBC 4.7 12/05/2017   HGB 10.5 (L) 10/26/2017   HCT 39.7 12/05/2017   MCV 100.3 12/05/2017   PLT 153 12/05/2017     Chemistry      Component Value Date/Time   NA 144 12/05/2017 0931   NA 140 06/09/2017 0758   NA 141 10/11/2016 0844   K 3.9 12/05/2017 0931   K 4.5 06/09/2017 0758   K 4.1 10/11/2016 0844   CL 104 12/05/2017 0931   CL 106  06/09/2017 0758   CO2 29 12/05/2017 0931   CO2 28 06/09/2017 0758   CO2 25 10/11/2016 0844   BUN 19 12/05/2017 0931   BUN 17 06/09/2017 0758   BUN 20.3 10/11/2016 0844   CREATININE 1.00 12/05/2017 0931   CREATININE 1.2 06/09/2017 0758   CREATININE 1.1 10/11/2016 0844      Component Value Date/Time   CALCIUM 9.6 12/05/2017 0931   CALCIUM 9.1 06/09/2017 0758   CALCIUM 9.5 10/11/2016 0844   ALKPHOS 56 12/05/2017 0931   ALKPHOS 49 06/09/2017 0758   ALKPHOS 52 10/11/2016 0844   AST 16 12/05/2017 0931   AST 13 10/11/2016 0844   ALT 11 12/05/2017 0931   ALT 19 06/09/2017 0758   ALT 10 10/11/2016 0844   BILITOT 0.9 12/05/2017 0931   BILITOT 0.83 10/11/2016 0844  Impression and Plan: Ms. Turlington is 83 year-old white female with stage IIA ductal carcinoma of the left breast. She had 4 positive lymph nodes. Her breast cancer was triple positive. She completed her adjuvant chemotherapy. She completed this in July of 2015.  She then went on to complete adjuvant Herceptin in June 2016.  It looks like she is doing quite well with the Aromasin. We will continue her on the Aromasin.  Given that she had 4 positive lymph nodes, I would keep her on the Aromasin for 10 years. I think this would be an official for her.  I would like to see her back in 3 months.  At that time, she will also get Prolia.. .   [name], MD 3/4/201910:57 AM

## 2017-12-07 ENCOUNTER — Ambulatory Visit
Admission: RE | Admit: 2017-12-07 | Discharge: 2017-12-07 | Disposition: A | Discharge status: Home / Self Care | Payer: Medicare Other | Source: Ambulatory Visit | Attending: Family Medicine | Admitting: Family Medicine

## 2017-12-07 DIAGNOSIS — Z9889 Other specified postprocedural states: Secondary | ICD-10-CM

## 2017-12-07 HISTORY — DX: Personal history of irradiation: Z92.3

## 2018-01-04 ENCOUNTER — Other Ambulatory Visit: Payer: Self-pay | Admitting: *Deleted

## 2018-01-04 DIAGNOSIS — Z170 Estrogen receptor positive status [ER+]: Principal | ICD-10-CM

## 2018-01-04 DIAGNOSIS — C50412 Malignant neoplasm of upper-outer quadrant of left female breast: Secondary | ICD-10-CM

## 2018-01-04 MED ORDER — EXEMESTANE 25 MG PO TABS: 25.0000 mg | ORAL_TABLET | Freq: Every day | ORAL | 6 refills | Status: DC

## 2018-01-25 ENCOUNTER — Other Ambulatory Visit: Payer: Self-pay | Admitting: Orthopedic Surgery

## 2018-01-25 DIAGNOSIS — M48061 Spinal stenosis, lumbar region without neurogenic claudication: Secondary | ICD-10-CM

## 2018-01-25 NOTE — Progress Notes (Signed)
Phone call to patient to verify medication list and allergies for myelogram procedure. Pt reports she is no longer taking Eliquis or Tramadol.

## 2018-02-03 ENCOUNTER — Ambulatory Visit
Admission: RE | Admit: 2018-02-03 | Discharge: 2018-02-03 | Disposition: A | Discharge status: Home / Self Care | Payer: Medicare Other | Source: Ambulatory Visit | Attending: Orthopedic Surgery | Admitting: Orthopedic Surgery

## 2018-02-03 DIAGNOSIS — M48061 Spinal stenosis, lumbar region without neurogenic claudication: Secondary | ICD-10-CM

## 2018-02-03 MED ORDER — IOPAMIDOL (ISOVUE-M 200) INJECTION 41%
15.0000 mL | Freq: Once | INTRAMUSCULAR | Status: AC
  Administered 2018-02-03: 15 mL via INTRATHECAL

## 2018-02-03 MED ORDER — DIAZEPAM 5 MG PO TABS
5.0000 mg | ORAL_TABLET | Freq: Once | ORAL | Status: AC
  Administered 2018-02-03: 5 mg via ORAL

## 2018-02-03 NOTE — Discharge Instructions (Signed)

## 2018-02-03 NOTE — Progress Notes (Signed)
Patient states she has been off Tramadol for at least the past two days. 

## 2018-02-09 ENCOUNTER — Other Ambulatory Visit: Payer: Self-pay | Admitting: Neurosurgery

## 2018-02-22 NOTE — Pre-Procedure Instructions (Signed)
Kim [name]  02/22/2018      [place], [place] [address] Phone: [phone] Fax: [phone]    Your procedure is scheduled on MAY 29  Report to [facility] Admitting at 0930 A.M.  Call this number if you have problems the morning of surgery:  [phone]   Remember:  NOTHING TO EAT OR DRINK AFTER MIDNIGHT    Take these medicines the morning of surgery with A SIP OF WATER  exemestane (AROMASIN)  levothyroxine (SYNTHROID, LEVOTHROID)  methylPREDNISolone (MEDROL DOSEPAK) metoprolol succinate (TOPROL-XL)  7 days prior to surgery STOP taking any Aspirin(unless otherwise instructed by your surgeon), Aleve, Naproxen, Ibuprofen, Motrin, Advil, Goody's, BC's, all herbal medications, fish oil, and all vitamins  Follow your doctors instructions regarding your Aspirin.  If no instructions were given by your doctor, then you will need to call the prescribing office office to get instructions.       Do not wear jewelry, make-up or nail polish.  Do not wear lotions, powders, or perfumes, or deodorant.  Do not shave 48 hours prior to surgery.    Do not bring valuables to the hospital.  [facility] is not responsible for any belongings or valuables.  Contacts, dentures or bridgework may not be worn into surgery.  Leave your suitcase in the car.  After surgery it may be brought to your room.  For patients admitted to the hospital, discharge time will be determined by your treatment team.  Patients discharged the day of surgery will not be allowed to drive home.    Special instructions:   [place]- Preparing For Surgery  Before surgery, you can play an important role. Because skin is not sterile, your skin needs to be as free of germs as possible. You can reduce the number of germs on your skin by washing with CHG (chlorahexidine gluconate) Soap before surgery.  CHG is an antiseptic cleaner  which kills germs and bonds with the skin to continue killing germs even after washing.    Oral Hygiene is also important to reduce your risk of infection.  Remember - BRUSH YOUR TEETH THE MORNING OF SURGERY WITH YOUR REGULAR TOOTHPASTE  Please do not use if you have an allergy to CHG or antibacterial soaps. If your skin becomes reddened/irritated stop using the CHG.  Do not shave (including legs and underarms) for at least 48 hours prior to first CHG shower. It is OK to shave your face.  Please follow these instructions carefully.   1. Shower the NIGHT BEFORE SURGERY and the MORNING OF SURGERY with CHG.   2. If you chose to wash your hair, wash your hair first as usual with your normal shampoo.  3. After you shampoo, rinse your hair and body thoroughly to remove the shampoo.  4. Use CHG as you would any other liquid soap. You can apply CHG directly to the skin and wash gently with a scrungie or a clean washcloth.   5. Apply the CHG Soap to your body ONLY FROM THE NECK DOWN.  Do not use on open wounds or open sores. Avoid contact with your eyes, ears, mouth and genitals (private parts). Wash Face and genitals (private parts)  with your normal soap.  6. Wash thoroughly, paying special attention to the area where your surgery will be performed.  7. Thoroughly rinse your body with warm water from the neck down.  8. DO NOT shower/wash with  your normal soap after using and rinsing off the CHG Soap.  9. Pat yourself dry with a CLEAN TOWEL.  10. Wear CLEAN PAJAMAS to bed the night before surgery, wear comfortable clothes the morning of surgery  11. Place CLEAN SHEETS on your bed the night of your first shower and DO NOT SLEEP WITH PETS.    Day of Surgery:  Do not apply any deodorants/lotions.  Please wear clean clothes to the hospital/surgery center.   Remember to brush your teeth WITH YOUR REGULAR TOOTHPASTE.    Please read over the following fact sheets that you were  given.

## 2018-02-23 ENCOUNTER — Other Ambulatory Visit: Payer: Self-pay

## 2018-02-23 ENCOUNTER — Encounter (HOSPITAL_COMMUNITY): Payer: Self-pay

## 2018-02-23 ENCOUNTER — Encounter (HOSPITAL_COMMUNITY)
Admission: RE | Admit: 2018-02-23 | Discharge: 2018-02-23 | Disposition: A | Discharge status: Home / Self Care | Payer: Medicare Other | Source: Ambulatory Visit | Attending: Neurosurgery | Admitting: Neurosurgery

## 2018-02-23 DIAGNOSIS — I421 Obstructive hypertrophic cardiomyopathy: Secondary | ICD-10-CM | POA: Diagnosis not present

## 2018-02-23 DIAGNOSIS — Z90710 Acquired absence of both cervix and uterus: Secondary | ICD-10-CM | POA: Insufficient documentation

## 2018-02-23 DIAGNOSIS — Z01818 Encounter for other preprocedural examination: Secondary | ICD-10-CM | POA: Diagnosis not present

## 2018-02-23 DIAGNOSIS — I252 Old myocardial infarction: Secondary | ICD-10-CM | POA: Diagnosis not present

## 2018-02-23 DIAGNOSIS — Z87440 Personal history of urinary (tract) infections: Secondary | ICD-10-CM | POA: Diagnosis not present

## 2018-02-23 DIAGNOSIS — C50912 Malignant neoplasm of unspecified site of left female breast: Secondary | ICD-10-CM | POA: Diagnosis not present

## 2018-02-23 DIAGNOSIS — Z9842 Cataract extraction status, left eye: Secondary | ICD-10-CM | POA: Insufficient documentation

## 2018-02-23 DIAGNOSIS — E785 Hyperlipidemia, unspecified: Secondary | ICD-10-CM | POA: Diagnosis not present

## 2018-02-23 DIAGNOSIS — Z01812 Encounter for preprocedural laboratory examination: Secondary | ICD-10-CM | POA: Diagnosis not present

## 2018-02-23 DIAGNOSIS — Z86010 Personal history of colonic polyps: Secondary | ICD-10-CM | POA: Diagnosis not present

## 2018-02-23 DIAGNOSIS — Z9221 Personal history of antineoplastic chemotherapy: Secondary | ICD-10-CM | POA: Diagnosis not present

## 2018-02-23 DIAGNOSIS — Z79811 Long term (current) use of aromatase inhibitors: Secondary | ICD-10-CM | POA: Insufficient documentation

## 2018-02-23 DIAGNOSIS — E039 Hypothyroidism, unspecified: Secondary | ICD-10-CM | POA: Insufficient documentation

## 2018-02-23 DIAGNOSIS — M19049 Primary osteoarthritis, unspecified hand: Secondary | ICD-10-CM | POA: Insufficient documentation

## 2018-02-23 DIAGNOSIS — Z923 Personal history of irradiation: Secondary | ICD-10-CM | POA: Diagnosis not present

## 2018-02-23 DIAGNOSIS — Z961 Presence of intraocular lens: Secondary | ICD-10-CM | POA: Insufficient documentation

## 2018-02-23 DIAGNOSIS — I10 Essential (primary) hypertension: Secondary | ICD-10-CM | POA: Diagnosis not present

## 2018-02-23 DIAGNOSIS — I2510 Atherosclerotic heart disease of native coronary artery without angina pectoris: Secondary | ICD-10-CM | POA: Insufficient documentation

## 2018-02-23 DIAGNOSIS — M5126 Other intervertebral disc displacement, lumbar region: Secondary | ICD-10-CM | POA: Insufficient documentation

## 2018-02-23 DIAGNOSIS — Z96651 Presence of right artificial knee joint: Secondary | ICD-10-CM | POA: Insufficient documentation

## 2018-02-23 DIAGNOSIS — Z79899 Other long term (current) drug therapy: Secondary | ICD-10-CM | POA: Insufficient documentation

## 2018-02-23 DIAGNOSIS — H9190 Unspecified hearing loss, unspecified ear: Secondary | ICD-10-CM | POA: Insufficient documentation

## 2018-02-23 DIAGNOSIS — Z7982 Long term (current) use of aspirin: Secondary | ICD-10-CM | POA: Insufficient documentation

## 2018-02-23 DIAGNOSIS — Z9841 Cataract extraction status, right eye: Secondary | ICD-10-CM | POA: Diagnosis not present

## 2018-02-23 DIAGNOSIS — Z79890 Hormone replacement therapy (postmenopausal): Secondary | ICD-10-CM | POA: Insufficient documentation

## 2018-02-23 DIAGNOSIS — Z955 Presence of coronary angioplasty implant and graft: Secondary | ICD-10-CM | POA: Insufficient documentation

## 2018-02-23 HISTORY — DX: Obstructive hypertrophic cardiomyopathy: I42.1

## 2018-02-23 LAB — BASIC METABOLIC PANEL
Anion gap: 8 (ref 5–15)
BUN: 22 mg/dL — ABNORMAL HIGH (ref 6–20)
CO2: 26 mmol/L (ref 22–32)
Calcium: 9.4 mg/dL (ref 8.9–10.3)
Chloride: 104 mmol/L (ref 101–111)
Creatinine, Ser: 0.9 mg/dL (ref 0.44–1.00)
GFR calc Af Amer: >60 mL/min (ref >60)
GFR calc non Af Amer: 58 mL/min — ABNORMAL LOW (ref >60)
Glucose, Bld: 111 mg/dL — ABNORMAL HIGH (ref 65–99)
Potassium: 4.3 mmol/L (ref 3.5–5.1)
Sodium: 138 mmol/L (ref 135–145)

## 2018-02-23 LAB — CBC
HCT: 41.5 % (ref 36.0–46.0)
Hemoglobin: 13.8 g/dL (ref 12.0–15.0)
MCH: 31.3 pg (ref 26.0–34.0)
MCHC: 33.3 g/dL (ref 30.0–36.0)
MCV: 94.1 fL (ref 78.0–100.0)
Platelets: 183 10*3/uL (ref 150–400)
RBC: 4.41 MIL/uL (ref 3.87–5.11)
RDW: 14.4 % (ref 11.5–15.5)
WBC: 6.7 10*3/uL (ref 4.0–10.5)

## 2018-02-23 LAB — SURGICAL PCR SCREEN
MRSA, PCR: NEGATIVE
Staphylococcus aureus: NEGATIVE

## 2018-02-23 NOTE — Progress Notes (Signed)
PCP is Dr. Delma Freeze  LOV 09/2017 H/o of Breast cancer. Had chemo in 2015, she said is messed her heart up, had to have stents placed during heart cath.  Denies any issues such as cp, sob murmur. Cardio is Dr. Einar Crow  LOv 01/2018   Echo done  2018 Oncologist is Dr. Marin Olp  LOV 12/2017 She has stopped her aspirin per doc's orders.

## 2018-02-23 NOTE — Pre-Procedure Instructions (Signed)
Kim [name]  02/23/2018      [place], [place] - [address] [name] [id number] Phone: [phone] Fax: [phone]    Your procedure is scheduled on  Wednesday,  MAY 29    Report to [facility] Admitting at 5:30 AM             (posted surgery time 7:30a - 9:33a)   Call this number if you have problems the morning of surgery:  [phone]   Remember:   NOTHING TO EAT OR DRINK AFTER MIDNIGHT, Tuesday, with this exception to the rule...........    Take these medicines the morning of surgery with A SIP OF WATER  exemestane (AROMASIN)  levothyroxine (SYNTHROID, LEVOTHROID)  methylPREDNISolone (MEDROL DOSEPAK) metoprolol succinate (TOPROL-XL)  7 days prior to surgery STOP taking any Aspirin(unless otherwise instructed by your surgeon), Aleve, Naproxen, Ibuprofen, Motrin, Advil, Goody's, BC's, all herbal medications, fish oil, and all vitamins  Follow your doctors instructions regarding your Aspirin.  If no instructions were given by your doctor, then you will need to call the prescribing office office to get instructions.       Do not wear jewelry, make-up or nail polish.  Do not wear lotions, powders,  perfumes, or deodorant.  Do not shave 48 hours prior to surgery.    Do not bring valuables to the hospital.  [facility] is not responsible for any belongings or valuables.  Contacts, dentures or bridgework may not be worn into surgery.  Leave your suitcase in the car.  After surgery it may be brought to your room.  For patients admitted to the hospital, discharge time will be determined by your treatment team.      Special instructions:   [facility]- Preparing For Surgery  Before surgery, you can play an important role. Because skin is not sterile, your skin needs to be as free of germs as possible. You can reduce the number of germs on your skin by washing with CHG (chlorahexidine gluconate) Soap  before surgery.  CHG is an antiseptic cleaner which kills germs and bonds with the skin to continue killing germs even after washing.     Oral Hygiene is also important to reduce your risk of infection.   Remember - BRUSH YOUR TEETH THE MORNING OF SURGERY WITH YOUR REGULAR TOOTHPASTE   Please do not use if you have an allergy to CHG or antibacterial soaps. If your skin becomes reddened/irritated stop using the CHG.  Do not shave (including legs and underarms) for at least 48 hours prior to first CHG shower. It is OK to shave your face.  Please follow these instructions carefully.   1. Shower the NIGHT BEFORE SURGERY and the MORNING OF SURGERY with CHG.   2. If you chose to wash your hair, wash your hair first as usual with your normal shampoo.  3. After you shampoo, rinse your hair and body thoroughly to remove the shampoo.  4. Use CHG as you would any other liquid soap. You can apply CHG directly to the skin and wash gently with a scrungie or a clean washcloth.   5. Apply the CHG Soap to your body ONLY FROM THE NECK DOWN.  Do not use on open wounds or open sores. Avoid contact with your eyes, ears, mouth and genitals (private parts). Wash Face and genitals (private parts)  with your normal soap.  6. Wash thoroughly, paying special attention to the area where your  surgery will be performed.  7. Thoroughly rinse your body with warm water from the neck down.  8. DO NOT shower/wash with your normal soap after using and rinsing off the CHG Soap.  9. Pat yourself dry with a CLEAN TOWEL.  10. Wear CLEAN PAJAMAS to bed the night before surgery, wear comfortable clothes the morning of surgery  11. Place CLEAN SHEETS on your bed the night of your first shower and DO NOT SLEEP WITH PETS.    Day of Surgery:  Do not apply any deodorants/lotions.  Please wear clean clothes to the hospital/surgery center.   Remember to brush your teeth WITH YOUR REGULAR TOOTHPASTE.    Please read over  the following fact sheets that you were given.

## 2018-02-24 ENCOUNTER — Encounter (HOSPITAL_COMMUNITY): Payer: Self-pay

## 2018-02-24 NOTE — Progress Notes (Signed)
Anesthesia Chart Review:  Case:  [id number] Date/Time:  03/01/18 0715   Procedure:  RIGHT LUMBAR 4- LUMBAR 5 LAMINECTOMY/MICRODISCECTOMY (Right ) - RIGHT LUMBAR 4- LUMBAR 5 LAMINECTOMY/MICRODISCECTOMY   Anesthesia type:  General   Pre-op diagnosis:  HERNIATED NUCLEUS PULPOSUS. LUMBAR   Location:  [facility] OR ROOM 20 / [place] OR   Surgeon:  [name], MD      DISCUSSION: Patient is a 83 year old female scheduled for the above procedure. History includes never smoker, HOCM, CAD (MI during Herceptin infusion s/p DES RCA 12/2013), HTN, hard of hearing, stage IIA left breast cancer (s/p left lumpectomy 11/05/13; Port-a-cath 12/11/13, completed chemo 04/2014; radiation completed 06/26/14; continue Aromasin x 10 years). She has been intolerant to statins and also to [place]. She is s/p right TKA 10/24/17.  She reports ASA is on hold for surgery. She thought last cardiology visit was 01/2018, but last visit seen was on 05/05/17. She now thinks it might have been her husband's cardiology appointment. I called and spoke with her. She denied chest pain, SOB, edema, syncope, dizziness, palpitations. Reports she was able to be active (ie, housework including vacuuming, yard working including bush trimming) until about one month ago when she developed back pain and severe shooting RLE pain. She cannot not tolerate standing for long periods of time. Above reviewed with anesthesiologist Dr. Oren Bracket and later her cardiologist Dr. [name]. Dr. Aundra Dubin felt that if patient was not experiencing any new CV symptoms then she could proceed with planned surgery. She should not stop her b-blocker (Toprol)! She was instructed to take Toprol on the day of surgery. She is scheduled to complete her Medrol Dosepak on 02/23/18.   VS: BP (!) 141/79   Pulse 78   Temp 36.8 C   Resp 20   Ht 5\' 8"  (1.727 m)   Wt 150 lb (68 kg)   LMP 10/19/1991   SpO2 98%   BMI 22.81 kg/m   PROVIDERS: Laurey Morale, MD is PCP - [name],  MD is cardiologist ([facility]). Last visit 05/05/17. Six month follow-up recommended. Burney Gauze, MD is HEM-ONC. Last visit 12/05/17.    LABS: Labs reviewed: Acceptable for surgery. (all labs ordered are listed, but only abnormal results are displayed)  Labs Reviewed  BASIC METABOLIC PANEL - Abnormal; Notable for the following components:      Result Value   Glucose, Bld 111 (*)    BUN 22 (*)    GFR calc non Af Amer 58 (*)    All other components within normal limits  SURGICAL PCR SCREEN  CBC    IMAGES: CXR 08/24/17: IMPRESSION: No active cardiopulmonary disease.   EKG: 05/05/17: SB at 59 bpm, first degree AV block, septal infarct (age undetermined), ST/T wave abnormality, consider lateral ischemia   CV: Echo 01/03/17: Study Conclusions - Left ventricle: Turbulence in LVOT SAM hard to appreciate small   LVOT gradient less than 74m/sec. Severe asymetric septal   hypertophy consisant with HOCM. The cavity size was normal.   Systolic function was normal. The estimated ejection fraction was   in the range of 55% to 60%. Left ventricular diastolic function   parameters were normal. - Mitral valve: There was mild regurgitation. - Left atrium: The atrium was moderately to severely dilated. - Atrial septum: No defect or patent foramen ovale was identified. - Pulmonary arteries: PA peak pressure: 32 mm Hg (S). - Pericardium, extracardiac: A trivial pericardial effusion was   identified.  MRI  Cardiac 02/13/14: IMPRESSION: 1. Normal LV size with moderate asymmetric basal septal hypertrophy and EF 65%. There was systolic anterior motion of the mitral valve with moderate MR. There was evidence for LV outflow tract obstruction. This patient has morphology and physiology consistent with hypertrophic cardiomyopathy. 2.  There was no definite myocardial delayed enhancement noted.  Cardiac cath 12/19/13: LM: No obstructive disease. LAD: Large caliber vessel that courses  to the apex. Proximal vessel with diffuse 20% stenosis. Mid with diffuse 50% after DIAG takeoff. Distal with 30%. DIAG branch with 30% proximal stenosis. CX: Large caliber vessel. 20% proximal stenosis, 30% mid stenosis after the takeoff of the two OM branches. OM1 tortuous with 20%, OM2 40% proximal stenosis. RCA: Large dominant vessel with 30% proximal stenosis, 99% mid stenosis, 20% distal stenosis. PL branch is moderate in caliber with 30% stenosis. PDA is moderate in caliber with proximal 70% stenosis, mid 50%.  LV angiogram: LVEF 65-70% Impression: 1. Severe stenosis mid RCA 2. Unstable angina 3. Successful PTCA/DES x 1 mid RCA 4. Moderate disease in LAD, Circumflex, PDA 5. Normal LV systolic function Recommendations: She will need continued therapy with ASA and Plavix for at least one year. Beta blocker as tolerated. Start statin.   Past Medical History:  Diagnosis Date  . Allergic rhinitis   . Allergy   . Arthritis    hands  . Breast cancer of upper-outer quadrant of left female breast ([place]) 11/08/13   finished chemo and radiation 2015  . CAD (coronary artery disease)    a. 12/2013 Cath/PCI: LM nl, LAD 20p, 55m, 30d, D1 30p, LCX 20p, 70m, OM1 20, Mo2 40p, RCA 30p, 10m (4.0x16 Promus Premier DES), 20d, RPL 30, RPDA 70p, 27m, EF 65-70%.  . Diverticulosis   . HOH (hard of hearing)    bilateral, wears hearing aids  . Hx of adenomatous colonic polyps 02/1999  . Hyperlipidemia   . Hypertension   . Hypothyroidism   . Iron deficiency anemia due to chronic blood loss 01/07/2016  . Low back pain    gets ESI from Dr. Rennis Harding  . Malabsorption of iron 01/07/2016  . Myocardial infarction ([place]) 12/2013   very mild-with first chemo -placed stent x1  . Personal history of radiation therapy 2015  . S/P radiation therapy 05/14/2014-06/26/2014   1) Left Breast  / 50 Gy in 25 fractions, 2) Left Supraclavicular fossa/ 47.5 Gy in 25 fractions, 3) Left Posterior Axillary boost / 4.825 Gy in 25 fractions,  4) Left Breast boost / 10 Gy in 5 fractions   . SVD (spontaneous vaginal delivery)    x 2  . Tubulovillous adenoma of colon 12/2009   with HGD  . Urinary tract infection   . Wears hearing aid    left and right     Past Surgical History:  Procedure Laterality Date  . ABDOMINAL HYSTERECTOMY    . ANTERIOR AND POSTERIOR REPAIR N/A 01/17/2015   Procedure: CYSTOCELE REPAIR ;  Surgeon: Princess Bruins, MD;  Location: [facility];  Service: Gynecology;  Laterality: N/A;  . BILATERAL SALPINGOOPHORECTOMY     see Laurin Coder NP for GYN exams  . BREAST BIOPSY Left 10/18/2013  . BREAST BIOPSY Left 10/31/2013  . BREAST LUMPECTOMY Left 11/05/2013  . BREAST LUMPECTOMY WITH NEEDLE LOCALIZATION AND AXILLARY LYMPH NODE DISSECTION Left 11/05/2013   Procedure: LEFT BREAST LUMPECTOMY WITH NEEDLE LOCALIZATION and axillary lymph Node Dissection;  Surgeon: Edward Jolly, MD;  Location: [facility];  Service: General;  Laterality:  Left;  . BREAST SURGERY  11/2013   left lumpectomy  . CATARACT EXTRACTION W/ INTRAOCULAR LENS  IMPLANT, BILATERAL  2012   bilateral  . COLONOSCOPY  11/04/2015   per Dr. Fuller Plan, adenomatous polyps, no repeats planned   . EYE SURGERY  11/22/2006   cataracts, bilateral, intraocular lens implant  . JOINT REPLACEMENT     2019  . LEFT HEART CATHETERIZATION WITH CORONARY ANGIOGRAM N/A 12/19/2013   Procedure: LEFT HEART CATHETERIZATION WITH CORONARY ANGIOGRAM;  Surgeon: Burnell Blanks, MD;  Location: [facility] CATH LAB;  Service: Cardiovascular;  Laterality: N/A;  . lumbar epidural steroid injection     lumber spinal stenosis  . POLYPECTOMY    . PORTACATH PLACEMENT Right 12/11/2013   Procedure: INSERTION PORT-A-CATH;  Surgeon: Edward Jolly, MD;  Location: [facility];  Service: General;  Laterality: Right;  Subclavian Vein;   . TOTAL KNEE ARTHROPLASTY Right 10/24/2017   Procedure: TOTAL RIGHT KNEE ARTHROPLASTY;  Surgeon: Gaynelle Arabian, MD;  Location: [facility];  Service: Orthopedics;  Laterality: Right;    MEDICATIONS: . aspirin EC 81 MG tablet  . Calcium Carbonate-Vitamin D (CALCIUM-D PO)  . exemestane (AROMASIN) 25 MG tablet  . ezetimibe (ZETIA) 10 MG tablet  . levothyroxine (SYNTHROID, LEVOTHROID) 112 MCG tablet  . methylPREDNISolone (MEDROL DOSEPAK) 4 MG TBPK tablet  . metoprolol succinate (TOPROL-XL) 50 MG 24 hr tablet  . Multiple Vitamin (MULTIVITAMIN WITH MINERALS) TABS tablet  . traMADol (ULTRAM) 50 MG tablet   No current facility-administered medications for this encounter.    George Hugh [facility] Short Stay Center/Anesthesiology Phone [phone] 02/24/2018 1:42 PM

## 2018-02-28 ENCOUNTER — Other Ambulatory Visit: Payer: Self-pay | Admitting: Neurosurgery

## 2018-02-28 NOTE — Anesthesia Preprocedure Evaluation (Addendum)
Anesthesia Evaluation  Patient identified by MRN, date of birth, ID band Patient awake    Reviewed: Allergy & Precautions, H&P , NPO status , Patient's Chart, lab work & pertinent test results  Airway Mallampati: II  TM Distance: <3 FB Neck ROM: full    Dental  (+) Teeth Intact, Dental Advisory Given   Pulmonary neg pulmonary ROS,    breath sounds clear to auscultation       Cardiovascular hypertension, Pt. on home beta blockers and Pt. on medications + angina + CAD, + Past MI and + Cardiac Stents  + Valvular Problems/Murmurs  Rhythm:regular Rate:Normal + Systolic murmurs Echo 05/05/43: Study Conclusions - Left ventricle: Turbulence in LVOT SAM hard to appreciate smallLVOT gradient less than 74m/sec. Severe asymetric septalhypertophy consisant with HOCM. The cavity size was normal.Systolic function was normal. The estimated ejection fraction was in the range of 55% to 60%. Left ventricular diastolic functionparameters were normal. - Mitral valve: There was mild regurgitation. - Left atrium: The atrium was moderately to severely dilated. - Atrial septum: No defect or patent foramen ovale was identified. - Pulmonary arteries: PA peak pressure: 32 mm Hg (S). - Pericardium, extracardiac: A trivial pericardial effusion wasidentified.     Neuro/Psych negative neurological ROS     GI/Hepatic negative GI ROS, Neg liver ROS,   Endo/Other  Hypothyroidism   Renal/GU negative Renal ROS     Musculoskeletal  (+) Arthritis ,   Abdominal   Peds  Hematology negative hematology ROS (+)   Anesthesia Other Findings   Reproductive/Obstetrics H/o breast CA                          Lab Results  Component Value Date   WBC 6.7 02/23/2018   HGB 13.8 02/23/2018   HCT 41.5 02/23/2018   MCV 94.1 02/23/2018   PLT 183 02/23/2018   Lab Results  Component Value Date   CREATININE 0.90 02/23/2018   BUN 22 (H)  02/23/2018   NA 138 02/23/2018   K 4.3 02/23/2018   CL 104 02/23/2018   CO2 26 02/23/2018    Anesthesia Physical  Anesthesia Plan  ASA: III  Anesthesia Plan: General   Post-op Pain Management:    Induction: Intravenous  PONV Risk Score and Plan: 3 and Ondansetron, Dexamethasone and Treatment may vary due to age or medical condition  Airway Management Planned: Oral ETT  Additional Equipment: Arterial line  Intra-op Plan:   Post-operative Plan: Extubation in OR  Informed Consent: I have reviewed the patients History and Physical, chart, labs and discussed the procedure including the risks, benefits and alternatives for the proposed anesthesia with the patient or authorized representative who has indicated his/her understanding and acceptance.     Plan Discussed with: CRNA, Surgeon and Anesthesiologist  Anesthesia Plan Comments:       Anesthesia Quick Evaluation

## 2018-03-01 ENCOUNTER — Ambulatory Visit (HOSPITAL_COMMUNITY): Payer: Medicare Other | Admitting: Anesthesiology

## 2018-03-01 ENCOUNTER — Ambulatory Visit (HOSPITAL_COMMUNITY): Payer: Medicare Other | Admitting: Vascular Surgery

## 2018-03-01 ENCOUNTER — Ambulatory Visit (HOSPITAL_COMMUNITY): Payer: Medicare Other

## 2018-03-01 ENCOUNTER — Observation Stay (HOSPITAL_COMMUNITY)
Admission: RE | Admit: 2018-03-01 | Discharge: 2018-03-02 | Disposition: A | Discharge status: Home / Self Care | Payer: Medicare Other | Source: Ambulatory Visit | Attending: Neurosurgery | Admitting: Neurosurgery

## 2018-03-01 ENCOUNTER — Encounter (HOSPITAL_COMMUNITY)
Admission: RE | Disposition: A | Discharge status: Home / Self Care | Payer: Self-pay | Source: Ambulatory Visit | Attending: Neurosurgery

## 2018-03-01 ENCOUNTER — Other Ambulatory Visit: Payer: Self-pay

## 2018-03-01 ENCOUNTER — Encounter (HOSPITAL_COMMUNITY): Payer: Self-pay | Admitting: Surgery

## 2018-03-01 DIAGNOSIS — Z923 Personal history of irradiation: Secondary | ICD-10-CM | POA: Insufficient documentation

## 2018-03-01 DIAGNOSIS — Z881 Allergy status to other antibiotic agents status: Secondary | ICD-10-CM | POA: Diagnosis not present

## 2018-03-01 DIAGNOSIS — I340 Nonrheumatic mitral (valve) insufficiency: Secondary | ICD-10-CM | POA: Diagnosis not present

## 2018-03-01 DIAGNOSIS — Z96651 Presence of right artificial knee joint: Secondary | ICD-10-CM | POA: Insufficient documentation

## 2018-03-01 DIAGNOSIS — M5116 Intervertebral disc disorders with radiculopathy, lumbar region: Secondary | ICD-10-CM | POA: Diagnosis not present

## 2018-03-01 DIAGNOSIS — Z419 Encounter for procedure for purposes other than remedying health state, unspecified: Secondary | ICD-10-CM

## 2018-03-01 DIAGNOSIS — M4726 Other spondylosis with radiculopathy, lumbar region: Secondary | ICD-10-CM | POA: Diagnosis not present

## 2018-03-01 DIAGNOSIS — E785 Hyperlipidemia, unspecified: Secondary | ICD-10-CM | POA: Diagnosis not present

## 2018-03-01 DIAGNOSIS — E039 Hypothyroidism, unspecified: Secondary | ICD-10-CM | POA: Insufficient documentation

## 2018-03-01 DIAGNOSIS — M810 Age-related osteoporosis without current pathological fracture: Secondary | ICD-10-CM | POA: Diagnosis not present

## 2018-03-01 DIAGNOSIS — M5126 Other intervertebral disc displacement, lumbar region: Secondary | ICD-10-CM | POA: Diagnosis present

## 2018-03-01 DIAGNOSIS — I252 Old myocardial infarction: Secondary | ICD-10-CM | POA: Insufficient documentation

## 2018-03-01 DIAGNOSIS — Z880 Allergy status to penicillin: Secondary | ICD-10-CM | POA: Insufficient documentation

## 2018-03-01 DIAGNOSIS — I2510 Atherosclerotic heart disease of native coronary artery without angina pectoris: Secondary | ICD-10-CM | POA: Insufficient documentation

## 2018-03-01 DIAGNOSIS — Z853 Personal history of malignant neoplasm of breast: Secondary | ICD-10-CM | POA: Diagnosis not present

## 2018-03-01 DIAGNOSIS — Z7982 Long term (current) use of aspirin: Secondary | ICD-10-CM | POA: Diagnosis not present

## 2018-03-01 DIAGNOSIS — I10 Essential (primary) hypertension: Secondary | ICD-10-CM | POA: Insufficient documentation

## 2018-03-01 DIAGNOSIS — R011 Cardiac murmur, unspecified: Secondary | ICD-10-CM | POA: Diagnosis not present

## 2018-03-01 DIAGNOSIS — Z79899 Other long term (current) drug therapy: Secondary | ICD-10-CM | POA: Diagnosis not present

## 2018-03-01 DIAGNOSIS — Z888 Allergy status to other drugs, medicaments and biological substances status: Secondary | ICD-10-CM | POA: Insufficient documentation

## 2018-03-01 DIAGNOSIS — I421 Obstructive hypertrophic cardiomyopathy: Secondary | ICD-10-CM | POA: Diagnosis not present

## 2018-03-01 HISTORY — PX: LUMBAR LAMINECTOMY/DECOMPRESSION MICRODISCECTOMY: SHX5026

## 2018-03-01 SURGERY — LUMBAR LAMINECTOMY/DECOMPRESSION MICRODISCECTOMY 1 LEVEL
Anesthesia: General | Site: Spine Lumbar | Laterality: Right

## 2018-03-01 MED ORDER — ESMOLOL HCL 100 MG/10ML IV SOLN
INTRAVENOUS | Status: DC | PRN
  Administered 2018-03-01: 30 mg via INTRAVENOUS

## 2018-03-01 MED ORDER — METHYLPREDNISOLONE ACETATE 80 MG/ML IJ SUSP
INTRAMUSCULAR | Status: DC | PRN
  Administered 2018-03-01: 80 mg

## 2018-03-01 MED ORDER — CHLORHEXIDINE GLUCONATE CLOTH 2 % EX PADS: 6.0000 | MEDICATED_PAD | Freq: Once | CUTANEOUS | Status: DC

## 2018-03-01 MED ORDER — MAGNESIUM HYDROXIDE 400 MG/5ML PO SUSP: 30.0000 mL | Freq: Every day | ORAL | Status: DC | PRN

## 2018-03-01 MED ORDER — METHYLPREDNISOLONE ACETATE 80 MG/ML IJ SUSP
INTRAMUSCULAR | Status: AC
  Filled 2018-03-01: qty 1

## 2018-03-01 MED ORDER — ALUM & MAG HYDROXIDE-SIMETH 200-200-20 MG/5ML PO SUSP: 30.0000 mL | Freq: Four times a day (QID) | ORAL | Status: DC | PRN

## 2018-03-01 MED ORDER — ONDANSETRON HCL 4 MG/2ML IJ SOLN
INTRAMUSCULAR | Status: AC
  Filled 2018-03-01: qty 2

## 2018-03-01 MED ORDER — ACETAMINOPHEN 325 MG PO TABS: 650.0000 mg | ORAL_TABLET | ORAL | Status: DC | PRN

## 2018-03-01 MED ORDER — ACETAMINOPHEN 10 MG/ML IV SOLN
INTRAVENOUS | Status: DC | PRN
  Administered 2018-03-01: 1000 mg via INTRAVENOUS

## 2018-03-01 MED ORDER — LIDOCAINE 2% (20 MG/ML) 5 ML SYRINGE
INTRAMUSCULAR | Status: DC | PRN
  Administered 2018-03-01: 60 mg via INTRAVENOUS

## 2018-03-01 MED ORDER — PROMETHAZINE HCL 25 MG/ML IJ SOLN: 6.2500 mg | INTRAMUSCULAR | Status: DC | PRN

## 2018-03-01 MED ORDER — LACTATED RINGERS IV SOLN
INTRAVENOUS | Status: DC
  Administered 2018-03-01: 07:00:00 via INTRAVENOUS

## 2018-03-01 MED ORDER — DEXAMETHASONE SODIUM PHOSPHATE 10 MG/ML IJ SOLN
INTRAMUSCULAR | Status: AC
  Filled 2018-03-01: qty 1

## 2018-03-01 MED ORDER — BUPIVACAINE HCL (PF) 0.5 % IJ SOLN
INTRAMUSCULAR | Status: AC
  Filled 2018-03-01: qty 30

## 2018-03-01 MED ORDER — ROCURONIUM BROMIDE 50 MG/5ML IV SOLN
INTRAVENOUS | Status: AC
  Filled 2018-03-01: qty 1

## 2018-03-01 MED ORDER — KCL IN DEXTROSE-NACL 20-5-0.45 MEQ/L-%-% IV SOLN: INTRAVENOUS | Status: DC

## 2018-03-01 MED ORDER — FENTANYL CITRATE (PF) 100 MCG/2ML IJ SOLN
INTRAMUSCULAR | Status: DC | PRN
  Administered 2018-03-01: 100 ug via INTRAVENOUS

## 2018-03-01 MED ORDER — ACETAMINOPHEN 10 MG/ML IV SOLN
INTRAVENOUS | Status: AC
  Filled 2018-03-01: qty 100

## 2018-03-01 MED ORDER — HYDROCODONE-ACETAMINOPHEN 5-325 MG PO TABS
ORAL_TABLET | ORAL | Status: AC
  Filled 2018-03-01: qty 1

## 2018-03-01 MED ORDER — BUPIVACAINE HCL (PF) 0.5 % IJ SOLN
INTRAMUSCULAR | Status: DC | PRN
  Administered 2018-03-01: 20 mL

## 2018-03-01 MED ORDER — HYDROCODONE-ACETAMINOPHEN 5-325 MG PO TABS
1.0000 | ORAL_TABLET | ORAL | Status: DC | PRN
  Administered 2018-03-01 – 2018-03-02 (×4): 1 via ORAL
  Filled 2018-03-01 (×3): qty 1

## 2018-03-01 MED ORDER — PHENYLEPHRINE 40 MCG/ML (10ML) SYRINGE FOR IV PUSH (FOR BLOOD PRESSURE SUPPORT)
PREFILLED_SYRINGE | INTRAVENOUS | Status: DC | PRN
  Administered 2018-03-01: 40 ug via INTRAVENOUS
  Administered 2018-03-01: 120 ug via INTRAVENOUS
  Administered 2018-03-01: 80 ug via INTRAVENOUS

## 2018-03-01 MED ORDER — SUGAMMADEX SODIUM 200 MG/2ML IV SOLN
INTRAVENOUS | Status: DC | PRN
  Administered 2018-03-01: 150 mg via INTRAVENOUS

## 2018-03-01 MED ORDER — PHENOL 1.4 % MT LIQD: 1.0000 | OROMUCOSAL | Status: DC | PRN

## 2018-03-01 MED ORDER — THROMBIN 5000 UNITS EX SOLR
CUTANEOUS | Status: DC | PRN
  Administered 2018-03-01 (×2): 5000 [IU] via TOPICAL

## 2018-03-01 MED ORDER — SODIUM CHLORIDE 0.9% FLUSH: 3.0000 mL | Freq: Two times a day (BID) | INTRAVENOUS | Status: DC

## 2018-03-01 MED ORDER — EZETIMIBE 10 MG PO TABS
10.0000 mg | ORAL_TABLET | Freq: Every day | ORAL | Status: DC
  Administered 2018-03-01: 10 mg via ORAL
  Filled 2018-03-01 (×2): qty 1

## 2018-03-01 MED ORDER — DEXAMETHASONE SODIUM PHOSPHATE 10 MG/ML IJ SOLN
INTRAMUSCULAR | Status: DC | PRN
  Administered 2018-03-01: 10 mg via INTRAVENOUS

## 2018-03-01 MED ORDER — HEMOSTATIC AGENTS (NO CHARGE) OPTIME
TOPICAL | Status: DC | PRN
  Administered 2018-03-01: 1 via TOPICAL

## 2018-03-01 MED ORDER — PHENYLEPHRINE HCL 10 MG/ML IJ SOLN
INTRAVENOUS | Status: DC | PRN
  Administered 2018-03-01: 30 ug/min via INTRAVENOUS

## 2018-03-01 MED ORDER — SODIUM CHLORIDE 0.9 % IV SOLN: 250.0000 mL | INTRAVENOUS | Status: DC

## 2018-03-01 MED ORDER — FENTANYL CITRATE (PF) 100 MCG/2ML IJ SOLN: 25.0000 ug | INTRAMUSCULAR | Status: DC | PRN

## 2018-03-01 MED ORDER — ROCURONIUM BROMIDE 10 MG/ML (PF) SYRINGE
PREFILLED_SYRINGE | INTRAVENOUS | Status: DC | PRN
  Administered 2018-03-01: 10 mg via INTRAVENOUS
  Administered 2018-03-01: 30 mg via INTRAVENOUS

## 2018-03-01 MED ORDER — EXEMESTANE 25 MG PO TABS
25.0000 mg | ORAL_TABLET | Freq: Every day | ORAL | Status: DC
  Filled 2018-03-01: qty 1

## 2018-03-01 MED ORDER — FENTANYL CITRATE (PF) 100 MCG/2ML IJ SOLN
INTRAMUSCULAR | Status: AC
  Filled 2018-03-01: qty 2

## 2018-03-01 MED ORDER — FENTANYL CITRATE (PF) 250 MCG/5ML IJ SOLN
INTRAMUSCULAR | Status: AC
  Filled 2018-03-01: qty 5

## 2018-03-01 MED ORDER — BISACODYL 10 MG RE SUPP: 10.0000 mg | Freq: Every day | RECTAL | Status: DC | PRN

## 2018-03-01 MED ORDER — ACETAMINOPHEN 650 MG RE SUPP: 650.0000 mg | RECTAL | Status: DC | PRN

## 2018-03-01 MED ORDER — LIDOCAINE-EPINEPHRINE 1 %-1:100000 IJ SOLN
INTRAMUSCULAR | Status: AC
  Filled 2018-03-01: qty 1

## 2018-03-01 MED ORDER — HYDROXYZINE HCL 50 MG PO TABS
50.0000 mg | ORAL_TABLET | ORAL | Status: DC | PRN
  Filled 2018-03-01: qty 1

## 2018-03-01 MED ORDER — VANCOMYCIN HCL IN DEXTROSE 1-5 GM/200ML-% IV SOLN
1000.0000 mg | INTRAVENOUS | Status: AC
  Administered 2018-03-01: 1000 mg via INTRAVENOUS
  Filled 2018-03-01 (×2): qty 200

## 2018-03-01 MED ORDER — SODIUM CHLORIDE 0.9 % IV SOLN
INTRAVENOUS | Status: DC | PRN
  Administered 2018-03-01: 500 mL

## 2018-03-01 MED ORDER — ONDANSETRON HCL 4 MG/2ML IJ SOLN
INTRAMUSCULAR | Status: DC | PRN
  Administered 2018-03-01: 4 mg via INTRAVENOUS

## 2018-03-01 MED ORDER — LEVOTHYROXINE SODIUM 112 MCG PO TABS
112.0000 ug | ORAL_TABLET | Freq: Every day | ORAL | Status: DC
  Administered 2018-03-02: 112 ug via ORAL
  Filled 2018-03-01: qty 1

## 2018-03-01 MED ORDER — THROMBIN 5000 UNITS EX SOLR
CUTANEOUS | Status: AC
  Filled 2018-03-01: qty 10000

## 2018-03-01 MED ORDER — GENTAMICIN IN SALINE 1.6-0.9 MG/ML-% IV SOLN
80.0000 mg | INTRAVENOUS | Status: AC
  Administered 2018-03-01: 80 mg via INTRAVENOUS
  Filled 2018-03-01: qty 50

## 2018-03-01 MED ORDER — PROPOFOL 10 MG/ML IV BOLUS
INTRAVENOUS | Status: DC | PRN
  Administered 2018-03-01 (×2): 20 mg via INTRAVENOUS
  Administered 2018-03-01: 100 mg via INTRAVENOUS
  Administered 2018-03-01: 10 mg via INTRAVENOUS

## 2018-03-01 MED ORDER — FENTANYL CITRATE (PF) 250 MCG/5ML IJ SOLN
INTRAMUSCULAR | Status: DC | PRN
  Administered 2018-03-01 (×3): 50 ug via INTRAVENOUS

## 2018-03-01 MED ORDER — LIDOCAINE-EPINEPHRINE 1 %-1:100000 IJ SOLN
INTRAMUSCULAR | Status: DC | PRN
  Administered 2018-03-01: 20 mL

## 2018-03-01 MED ORDER — LACTATED RINGERS IV SOLN
INTRAVENOUS | Status: DC | PRN
  Administered 2018-03-01 (×2): via INTRAVENOUS

## 2018-03-01 MED ORDER — CYCLOBENZAPRINE HCL 5 MG PO TABS: 5.0000 mg | ORAL_TABLET | Freq: Three times a day (TID) | ORAL | Status: DC | PRN

## 2018-03-01 MED ORDER — 0.9 % SODIUM CHLORIDE (POUR BTL) OPTIME
TOPICAL | Status: DC | PRN
  Administered 2018-03-01: 1000 mL

## 2018-03-01 MED ORDER — SODIUM CHLORIDE 0.9% FLUSH: 3.0000 mL | INTRAVENOUS | Status: DC | PRN

## 2018-03-01 MED ORDER — METOPROLOL SUCCINATE ER 50 MG PO TB24
50.0000 mg | ORAL_TABLET | Freq: Two times a day (BID) | ORAL | Status: DC
  Administered 2018-03-01: 50 mg via ORAL
  Filled 2018-03-01: qty 1

## 2018-03-01 MED ORDER — HYDROXYZINE HCL 50 MG/ML IM SOLN: 50.0000 mg | INTRAMUSCULAR | Status: DC | PRN

## 2018-03-01 MED ORDER — MENTHOL 3 MG MT LOZG: 1.0000 | LOZENGE | OROMUCOSAL | Status: DC | PRN

## 2018-03-01 MED ORDER — PROPOFOL 10 MG/ML IV BOLUS
INTRAVENOUS | Status: AC
  Filled 2018-03-01: qty 20

## 2018-03-01 MED ORDER — FLEET ENEMA 7-19 GM/118ML RE ENEM: 1.0000 | ENEMA | Freq: Once | RECTAL | Status: DC | PRN

## 2018-03-01 MED ORDER — LIDOCAINE 2% (20 MG/ML) 5 ML SYRINGE
INTRAMUSCULAR | Status: AC
  Filled 2018-03-01: qty 5

## 2018-03-01 MED ORDER — ESMOLOL HCL 100 MG/10ML IV SOLN
INTRAVENOUS | Status: AC
  Filled 2018-03-01: qty 10

## 2018-03-01 MED ORDER — MORPHINE SULFATE (PF) 4 MG/ML IV SOLN: 4.0000 mg | INTRAVENOUS | Status: DC | PRN

## 2018-03-01 SURGICAL SUPPLY — 64 items
ADH SKN CLS APL DERMABOND .7 (GAUZE/BANDAGES/DRESSINGS) ×1
APL SKNCLS STERI-STRIP NONHPOA (GAUZE/BANDAGES/DRESSINGS)
BAG DECANTER FOR FLEXI CONT (MISCELLANEOUS) ×2 IMPLANT
BENZOIN TINCTURE PRP APPL 2/3 (GAUZE/BANDAGES/DRESSINGS) IMPLANT
BLADE CLIPPER SURG (BLADE) IMPLANT
BUR ACRON 5.0MM COATED (BURR) ×1 IMPLANT
BUR MATCHSTICK NEURO 3.0 LAGG (BURR) ×2 IMPLANT
CANISTER SUCT 3000ML PPV (MISCELLANEOUS) ×2 IMPLANT
CARTRIDGE OIL MAESTRO DRILL (MISCELLANEOUS) ×1 IMPLANT
DECANTER SPIKE VIAL GLASS SM (MISCELLANEOUS) ×2 IMPLANT
DERMABOND ADVANCED (GAUZE/BANDAGES/DRESSINGS) ×1
DERMABOND ADVANCED .7 DNX12 (GAUZE/BANDAGES/DRESSINGS) ×1 IMPLANT
DIFFUSER DRILL AIR PNEUMATIC (MISCELLANEOUS) ×2 IMPLANT
DRAPE LAPAROTOMY 100X72X124 (DRAPES) ×2 IMPLANT
DRAPE MICROSCOPE LEICA (MISCELLANEOUS) ×2 IMPLANT
DRAPE POUCH INSTRU U-SHP 10X18 (DRAPES) ×2 IMPLANT
ELECT REM PT RETURN 9FT ADLT (ELECTROSURGICAL) ×2
ELECTRODE REM PT RTRN 9FT ADLT (ELECTROSURGICAL) ×1 IMPLANT
GAUZE SPONGE 4X4 12PLY STRL (GAUZE/BANDAGES/DRESSINGS) IMPLANT
GAUZE SPONGE 4X4 12PLY STRL LF (GAUZE/BANDAGES/DRESSINGS) ×1 IMPLANT
GAUZE SPONGE 4X4 16PLY XRAY LF (GAUZE/BANDAGES/DRESSINGS) IMPLANT
GLOVE BIOGEL PI IND STRL 6.5 (GLOVE) IMPLANT
GLOVE BIOGEL PI IND STRL 7.5 (GLOVE) IMPLANT
GLOVE BIOGEL PI IND STRL 8 (GLOVE) ×1 IMPLANT
GLOVE BIOGEL PI INDICATOR 6.5 (GLOVE) ×2
GLOVE BIOGEL PI INDICATOR 7.5 (GLOVE) ×1
GLOVE BIOGEL PI INDICATOR 8 (GLOVE) ×1
GLOVE ECLIPSE 7.5 STRL STRAW (GLOVE) ×2 IMPLANT
GLOVE EXAM NITRILE LRG STRL (GLOVE) IMPLANT
GLOVE EXAM NITRILE XL STR (GLOVE) IMPLANT
GLOVE EXAM NITRILE XS STR PU (GLOVE) IMPLANT
GLOVE SURG SS PI 6.5 STRL IVOR (GLOVE) ×5 IMPLANT
GOWN STRL REUS W/ TWL LRG LVL3 (GOWN DISPOSABLE) IMPLANT
GOWN STRL REUS W/ TWL XL LVL3 (GOWN DISPOSABLE) ×1 IMPLANT
GOWN STRL REUS W/TWL 2XL LVL3 (GOWN DISPOSABLE) IMPLANT
GOWN STRL REUS W/TWL LRG LVL3 (GOWN DISPOSABLE) ×4
GOWN STRL REUS W/TWL XL LVL3 (GOWN DISPOSABLE) ×4
KIT BASIN OR (CUSTOM PROCEDURE TRAY) ×2 IMPLANT
KIT TURNOVER KIT B (KITS) ×2 IMPLANT
NDL HYPO 18GX1.5 BLUNT FILL (NEEDLE) IMPLANT
NDL SPNL 18GX3.5 QUINCKE PK (NEEDLE) ×1 IMPLANT
NDL SPNL 22GX3.5 QUINCKE BK (NEEDLE) ×1 IMPLANT
NEEDLE HYPO 18GX1.5 BLUNT FILL (NEEDLE) IMPLANT
NEEDLE SPNL 18GX3.5 QUINCKE PK (NEEDLE) ×2 IMPLANT
NEEDLE SPNL 22GX3.5 QUINCKE BK (NEEDLE) ×4 IMPLANT
NS IRRIG 1000ML POUR BTL (IV SOLUTION) ×2 IMPLANT
OIL CARTRIDGE MAESTRO DRILL (MISCELLANEOUS) ×2
PACK LAMINECTOMY NEURO (CUSTOM PROCEDURE TRAY) ×2 IMPLANT
PAD ARMBOARD 7.5X6 YLW CONV (MISCELLANEOUS) ×6 IMPLANT
PATTIES SURGICAL .5 X1 (DISPOSABLE) ×1 IMPLANT
RUBBERBAND STERILE (MISCELLANEOUS) ×4 IMPLANT
SPONGE LAP 4X18 RFD (DISPOSABLE) IMPLANT
SPONGE SURGIFOAM ABS GEL SZ50 (HEMOSTASIS) ×2 IMPLANT
STRIP CLOSURE SKIN 1/2X4 (GAUZE/BANDAGES/DRESSINGS) IMPLANT
SUT PROLENE 6 0 BV (SUTURE) IMPLANT
SUT VIC AB 1 CT1 18XBRD ANBCTR (SUTURE) ×1 IMPLANT
SUT VIC AB 1 CT1 8-18 (SUTURE) ×2
SUT VIC AB 2-0 CP2 18 (SUTURE) ×3 IMPLANT
SUT VIC AB 3-0 SH 8-18 (SUTURE) IMPLANT
SYR 5ML LL (SYRINGE) IMPLANT
TAPE CLOTH SURG 4X10 WHT LF (GAUZE/BANDAGES/DRESSINGS) ×1 IMPLANT
TOWEL GREEN STERILE (TOWEL DISPOSABLE) ×2 IMPLANT
TOWEL GREEN STERILE FF (TOWEL DISPOSABLE) ×2 IMPLANT
WATER STERILE IRR 1000ML POUR (IV SOLUTION) ×2 IMPLANT

## 2018-03-01 NOTE — Transfer of Care (Signed)
Immediate Anesthesia Transfer of Care Note  Patient: Kim [name]  Procedure(s) Performed: RIGHT LUMBAR FOUR- LUMBAR FIVE LAMINECTOMY/MICRODISCECTOMY (Right Spine Lumbar)  Patient Location: PACU  Anesthesia Type:General  Level of Consciousness: awake, alert  and oriented  Airway & Oxygen Therapy: Patient Spontanous Breathing  Post-op Assessment: Report given to RN, Post -op Vital signs reviewed and stable and Patient moving all extremities X 4  Post vital signs: Reviewed and stable  Last Vitals:  Vitals Value Taken Time  BP 120/58 03/01/2018  9:17 AM  Temp    Pulse 83 03/01/2018  9:20 AM  Resp 15 03/01/2018  9:20 AM  SpO2 97 % 03/01/2018  9:20 AM  Vitals shown include unvalidated device data.  Last Pain:  Vitals:   03/01/18 0636  TempSrc:   PainSc: 5       Patients Stated Pain Goal: 8 (33/82/50 5397)  Complications: No apparent anesthesia complications

## 2018-03-01 NOTE — Anesthesia Postprocedure Evaluation (Signed)
Anesthesia Post Note  Patient: Kim [name]  Procedure(s) Performed: RIGHT LUMBAR FOUR- LUMBAR FIVE LAMINECTOMY/MICRODISCECTOMY (Right Spine Lumbar)     Patient location during evaluation: PACU Anesthesia Type: General Level of consciousness: awake and alert Pain management: pain level controlled Vital Signs Assessment: post-procedure vital signs reviewed and stable Respiratory status: spontaneous breathing, nonlabored ventilation, respiratory function stable and patient connected to nasal cannula oxygen Cardiovascular status: blood pressure returned to baseline and stable Postop Assessment: no apparent nausea or vomiting Anesthetic complications: no    Last Vitals:  Vitals:   03/01/18 1017 03/01/18 1040  BP: (!) 99/57 121/84  Pulse: 89 90  Resp: 15 17  Temp:  36.8 C  SpO2: 97% 95%    Last Pain:  Vitals:   03/01/18 1040  TempSrc: Oral  PainSc:                  [name]

## 2018-03-01 NOTE — Anesthesia Procedure Notes (Signed)
Arterial Line Insertion Start/End5/29/2019 7:10 AM, 03/01/2018 7:23 AM Performed by: Harden Mo, CRNA, CRNA  Patient location: Pre-op. Preanesthetic checklist: patient identified, IV checked, site marked, risks and benefits discussed, surgical consent, monitors and equipment checked, pre-op evaluation and anesthesia consent Lidocaine 1% used for infiltration Right, radial was placed Catheter size: 20 G Hand hygiene performed  and maximum sterile barriers used   Attempts: 1 Procedure performed without using ultrasound guided technique. Ultrasound Notes:anatomy identified, needle tip was noted to be adjacent to the nerve/plexus identified and no ultrasound evidence of intravascular and/or intraneural injection Following insertion, dressing applied and Biopatch. Post procedure assessment: normal  Patient tolerated the procedure well with no immediate complications.

## 2018-03-01 NOTE — H&P (Signed)
Subjective: Patient is a 83 y.o. right-handed white female who is admitted for treatment of disabling right lumbar radiculopathy secondary to an acute right L4-5 lumbar disc herniation, with a free fragment that is migrated rostrally behind the body of L4.  This began about 2 months ago, with no specific inciting cause.  She has been having pain from the right side of low back radiating into the right buttock and thigh and particularly into the anterior right leg and right great toe.  She has had numbness and tingling in the right foot and a sense of weakness in the right lower extremity.  She was treated with a prednisone Dosepak without relief.  She was evaluated with a lumbar myelogram post myelogram CT scan which revealed the disc herniation.  He is admitted now for a right L4 hemilaminotomy and microdiscectomy.   Patient Active Problem List   Diagnosis Date Noted  . History of total knee replacement, right 11/13/2017  . OA (osteoarthritis) of knee 10/24/2017  . Chronic pain of right knee 06/17/2017  . Osteoporosis 07/30/2016  . Iron deficiency anemia due to chronic blood loss 01/07/2016  . Malabsorption of iron 01/07/2016  . Postoperative state 01/17/2015  . HOCM (hypertrophic obstructive cardiomyopathy) ([place]) 01/09/2014  . Breast cancer ([place]) 01/09/2014  . Coronary atherosclerosis of native coronary artery 12/20/2013  . CAD (coronary artery disease)   . Hyperlipidemia   . Unstable angina ([place]) 12/19/2013  . Breast cancer of upper-outer quadrant of left female breast ([place]) 11/05/2013  . DCIS (ductal carcinoma in situ) of breast 10/26/2013  . Hypertension 04/05/2012  . Cardiac murmur 04/05/2012  . UNSPECIFIED HEARING LOSS 08/07/2010  . LOW BACK PAIN 04/22/2009  . CONSTIPATION 11/04/2008  . COLONIC POLYPS, HX OF 11/01/2008  . ALLERGIC RHINITIS 06/27/2007  . Hypothyroidism 06/22/2007   Past Medical History:  Diagnosis Date  . Allergic rhinitis   . Allergy   . Arthritis    hands   . Breast cancer of upper-outer quadrant of left female breast ([place]) 11/08/13   finished chemo and radiation 2015  . CAD (coronary artery disease)    a. 12/2013 Cath/PCI: LM nl, LAD 20p, 58m, 30d, D1 30p, LCX 20p, 68m, OM1 20, Mo2 40p, RCA 30p, 24m (4.0x16 Promus Premier DES), 20d, RPL 30, RPDA 70p, 18m, EF 65-70%.  . Diverticulosis   . HOH (hard of hearing)    bilateral, wears hearing aids  . Hx of adenomatous colonic polyps 02/1999  . Hyperlipidemia   . Hypertension   . Hypertrophic obstructive cardiomyopathy (HOCM) [facility])    Cardiologist is Dr. [name]  . Hypothyroidism   . Iron deficiency anemia due to chronic blood loss 01/07/2016  . Low back pain    gets ESI from Dr. Rennis Harding  . Malabsorption of iron 01/07/2016  . Myocardial infarction ([place]) 12/2013   very mild-with first chemo -placed stent x1  . Personal history of radiation therapy 2015  . S/P radiation therapy 05/14/2014-06/26/2014   1) Left Breast  / 50 Gy in 25 fractions, 2) Left Supraclavicular fossa/ 47.5 Gy in 25 fractions, 3) Left Posterior Axillary boost / 4.825 Gy in 25 fractions, 4) Left Breast boost / 10 Gy in 5 fractions   . SVD (spontaneous vaginal delivery)    x 2  . Tubulovillous adenoma of colon 12/2009   with HGD  . Urinary tract infection   . Wears hearing aid    left and right     Past Surgical History:  Procedure Laterality  Date  . ABDOMINAL HYSTERECTOMY    . ANTERIOR AND POSTERIOR REPAIR N/A 01/17/2015   Procedure: CYSTOCELE REPAIR ;  Surgeon: Princess Bruins, MD;  Location: [facility];  Service: Gynecology;  Laterality: N/A;  . BILATERAL SALPINGOOPHORECTOMY     see Laurin Coder NP for GYN exams  . BREAST BIOPSY Left 10/18/2013  . BREAST BIOPSY Left 10/31/2013  . BREAST LUMPECTOMY Left 11/05/2013  . BREAST LUMPECTOMY WITH NEEDLE LOCALIZATION AND AXILLARY LYMPH NODE DISSECTION Left 11/05/2013   Procedure: LEFT BREAST LUMPECTOMY WITH NEEDLE LOCALIZATION and axillary lymph Node Dissection;  Surgeon: Edward Jolly, MD;  Location: [facility];  Service: General;  Laterality: Left;  . BREAST SURGERY  11/2013   left lumpectomy  . CATARACT EXTRACTION W/ INTRAOCULAR LENS  IMPLANT, BILATERAL  2012   bilateral  . COLONOSCOPY  11/04/2015   per Dr. Fuller Plan, adenomatous polyps, no repeats planned   . EYE SURGERY  11/22/2006   cataracts, bilateral, intraocular lens implant  . JOINT REPLACEMENT     2019  . LEFT HEART CATHETERIZATION WITH CORONARY ANGIOGRAM N/A 12/19/2013   Procedure: LEFT HEART CATHETERIZATION WITH CORONARY ANGIOGRAM;  Surgeon: Burnell Blanks, MD;  Location[facility];  Service: Cardiovascular;  Laterality: N/A;  . lumbar epidural steroid injection     lumber spinal stenosis  . POLYPECTOMY    . PORTACATH PLACEMENT Right 12/11/2013   Procedure: INSERTION PORT-A-CATH;  Surgeon: Edward Jolly, MD;  Location: [facility];  Service: General;  Laterality: Right;  Subclavian Vein;   . TOTAL KNEE ARTHROPLASTY Right 10/24/2017   Procedure: TOTAL RIGHT KNEE ARTHROPLASTY;  Surgeon: Gaynelle Arabian, MD;  Location: [facility];  Service: Orthopedics;  Laterality: Right;    Medications Prior to Admission  Medication Sig Dispense Refill Last Dose  . aspirin EC 81 MG tablet Take 81 mg by mouth daily.   Past Week at Unknown time  . Calcium Carbonate-Vitamin D (CALCIUM-D PO) Take 1 tablet by mouth daily.   Past Week at Unknown time  . exemestane (AROMASIN) 25 MG tablet Take 1 tablet (25 mg total) by mouth daily after breakfast. 30 tablet 6 03/01/2018 at 0500  . ezetimibe (ZETIA) 10 MG tablet Take 10 mg by mouth daily.   Past Week at Unknown time  . levothyroxine (SYNTHROID, LEVOTHROID) 112 MCG tablet TAKE 1 TABLET BY MOUTH ONCE DAILY 90 tablet 1 03/01/2018 at 0500  . methylPREDNISolone (MEDROL DOSEPAK) 4 MG TBPK tablet (Typical regimens for 21 tablet dose packs of Methylprednisolone 4mg , Prednisone 5mg , and Prednisone 10mg ) Day 1: 2 tabs before breakfast, 1 tab after  lunch, 1 tab after supper, and 2 tabs at bedtime. Day 2: 1 tab before breakfast, 1 tab after lunch, 1 tab after supper, and 2 tabs at bedtime. Day 3: 1 tab before breakfast, 1 tab after lunch, 1 tab after supper, and 1 tab at bedtime. Day 4: 1 tab before breakfast, 1 tab after lunch, and 1 tab at bedtime. Day 5: 1 tab before breakfast and 1 tab at bedtime. Day 6: 1 tab before breakfast.   Past Week at Unknown time  . metoprolol succinate (TOPROL-XL) 50 MG 24 hr tablet TAKE 1 TABLET BY MOUTH 2 TIMES DAILY WITH OR IMMEDIATELY FOLLOWING A MEAL (Patient taking differently: Take 50 mg by mouth once daily) 60 tablet 6 03/01/2018 at 0500  . Multiple Vitamin (MULTIVITAMIN WITH MINERALS) TABS tablet Take 1 tablet by mouth daily.   Past Week at Unknown time  . traMADol (  ULTRAM) 50 MG tablet Take 1-2 tablets (50-100 mg total) by mouth every 6 (six) hours as needed for moderate pain (not relieved by oxycodone). (Patient not taking: Reported on 02/20/2018) 56 tablet 0 Completed Course at Unknown time   Allergies  Allergen Reactions  . Levaquin [Levofloxacin] Hives  . Penicillins Swelling    FACIAL SWELLING PATIENT HAS HAD A PCN REACTION WITH IMMEDIATE RASH, FACIAL/TONGUE/THROAT SWELLING, SOB, OR LIGHTHEADEDNESS WITH HYPOTENSION:  #  #  YES  #  #  Has patient had a PCN reaction causing severe rash involving mucus membranes or skin necrosis: No Has patient had a PCN reaction that required hospitalization: No Has patient had a PCN reaction occurring within the last 10 years: No  . Propoxyphene N-Acetaminophen Swelling    tongue swelling  . Statins Other (See Comments)    Joint pain  . Cortisone Rash  . Evolocumab Other (See Comments)    Myalgias, fatigue, some itching and burning on her feet,  Arthralgia (Joint Pain)  . Myrbetriq [Mirabegron] Rash    Rash on face  . Pseudoephedrine Other (See Comments)    Facial flushing    Social History   Tobacco Use  . Smoking status: Never Smoker  . Smokeless  tobacco: Never Used  . Tobacco comment: never used tobacco  Substance Use Topics  . Alcohol use: No    Alcohol/week: 0.0 oz    Family History  Problem Relation Age of Onset  . Alcohol abuse Father   . Kidney failure Mother   . Colon cancer Neg Hx   . Rectal cancer Neg Hx   . Stomach cancer Neg Hx   . Colon polyps Neg Hx      Review of Systems Pertinent items noted in HPI and remainder of comprehensive ROS otherwise negative.  Objective: Vital signs in last 24 hours: Temp:  [97.9 F (36.6 C)] 97.9 F (36.6 C) (05/29 0552) Pulse Rate:  [79] 79 (05/29 0552) Resp:  [20] 20 (05/29 0552) BP: (115)/(69) 115/69 (05/29 0552) SpO2:  [97 %] 97 % (05/29 0552)  EXAM: Patient is a well-developed well-nourished white female in no acute distress.   Lungs are clear to auscultation , the patient has symmetrical respiratory excursion. Heart has a regular rate and rhythm normal S1 and S2 no murmur.   Abdomen is soft nontender nondistended bowel sounds are present. Extremity examination shows no clubbing cyanosis or edema. Motor examination shows 5 over 5 strength in the lower extremities including the iliopsoas quadriceps dorsiflexor extensor hallicus  longus and plantar flexor bilaterally. Sensation is intact to pinprick in the distal lower extremities. Reflexes are symmetrical bilaterally. No pathologic reflexes are present. Patient has a normal gait and stance.  Data Review:CBC    Component Value Date/Time   WBC 6.7 02/23/2018 1612   RBC 4.41 02/23/2018 1612   HGB 13.8 02/23/2018 1612   HGB 13.1 12/05/2017 0931   HGB 14.0 06/09/2017 0758   HGB 11.4 (L) 01/28/2014 1354   HCT 41.5 02/23/2018 1612   HCT 41.3 06/09/2017 0758   HCT 34.1 (L) 01/28/2014 1354   PLT 183 02/23/2018 1612   PLT 153 12/05/2017 0931   PLT 113 (L) 06/09/2017 0758   PLT 198 01/28/2014 1354   MCV 94.1 02/23/2018 1612   MCV 96 06/09/2017 0758   MCV 93.7 01/28/2014 1354   MCH 31.3 02/23/2018 1612   MCHC 33.3  02/23/2018 1612   RDW 14.4 02/23/2018 1612   RDW 12.8 06/09/2017 0758   RDW 17.3 (  H) 01/28/2014 1354   LYMPHSABS 0.7 (L) 12/05/2017 0931   LYMPHSABS 0.8 (L) 06/09/2017 0758   LYMPHSABS 0.5 (L) 01/28/2014 1354   MONOABS 0.4 12/05/2017 0931   MONOABS 0.5 01/28/2014 1354   EOSABS 0.1 12/05/2017 0931   EOSABS 0.1 06/09/2017 0758   BASOSABS 0.0 12/05/2017 0931   BASOSABS 0.0 06/09/2017 0758   BASOSABS 0.0 01/28/2014 1354                          BMET    Component Value Date/Time   NA 138 02/23/2018 1612   NA 140 06/09/2017 0758   NA 141 10/11/2016 0844   K 4.3 02/23/2018 1612   K 4.5 06/09/2017 0758   K 4.1 10/11/2016 0844   CL 104 02/23/2018 1612   CL 106 06/09/2017 0758   CO2 26 02/23/2018 1612   CO2 28 06/09/2017 0758   CO2 25 10/11/2016 0844   GLUCOSE 111 (H) 02/23/2018 1612   GLUCOSE 90 06/09/2017 0758   BUN 22 (H) 02/23/2018 1612   BUN 17 06/09/2017 0758   BUN 20.3 10/11/2016 0844   CREATININE 0.90 02/23/2018 1612   CREATININE 1.00 12/05/2017 0931   CREATININE 1.2 06/09/2017 0758   CREATININE 1.1 10/11/2016 0844   CALCIUM 9.4 02/23/2018 1612   CALCIUM 9.1 06/09/2017 0758   CALCIUM 9.5 10/11/2016 0844   GFRNONAA 58 (L) 02/23/2018 1612   GFRAA >60 02/23/2018 1612     Assessment/Plan: Patient with disabling right lumbar radiculopathy secondary to a right L4-5 lumbar disc herniation, with a free fragment that has migrated rostrally behind the body of L4.  Patient is admitted now for a right L4 laminotomy and microdiscectomy.  I've discussed with the patient the nature of his condition, the nature the surgical procedure, the typical length of surgery, hospital stay, and overall recuperation. We discussed limitations postoperatively. I discussed risks of surgery including risks of infection, bleeding, possibly need for transfusion, the risk of nerve root dysfunction with pain, weakness, numbness, or paresthesias, or risk of dural tear and CSF leakage and possible need for  further surgery, the risk of recurrent disc herniation and the possible need for further surgery, and the risk of anesthetic complications including myocardial infarction, stroke, pneumonia, and death. Understanding all this the patient does wish to proceed with surgery and is admitted for such.    Hosie Spangle, MD 03/01/2018 6:52 AM

## 2018-03-01 NOTE — Anesthesia Procedure Notes (Signed)
Procedure Name: Intubation Date/Time: 03/01/2018 7:38 AM Performed by: Harden Mo, CRNA Pre-anesthesia Checklist: Patient identified, Emergency Drugs available, Suction available and Patient being monitored Patient Re-evaluated:Patient Re-evaluated prior to induction Oxygen Delivery Method: Circle System Utilized Preoxygenation: Pre-oxygenation with 100% oxygen Induction Type: IV induction Ventilation: Mask ventilation without difficulty Laryngoscope Size: Miller and 2 Grade View: Grade I Tube type: Oral Tube size: 7.0 mm Number of attempts: 1 Airway Equipment and Method: Stylet and Oral airway Placement Confirmation: ETT inserted through vocal cords under direct vision,  positive ETCO2 and breath sounds checked- equal and bilateral Secured at: 22 cm Tube secured with: Tape Dental Injury: Teeth and Oropharynx as per pre-operative assessment

## 2018-03-01 NOTE — Op Note (Signed)
03/01/2018  9:22 AM  PATIENT:  Kim [name]  83 y.o. female  PRE-OPERATIVE DIAGNOSIS: Right L4-5 lumbar disc herniation with free fragment migrated rostrally behind the body of L4, lumbar spondylosis, lumbar degenerative disease, right lumbar radiculopathy  POST-OPERATIVE DIAGNOSIS:  Right L4-5 lumbar disc herniation with free fragment migrated rostrally behind the body of L4, lumbar spondylosis, lumbar degenerative disease, right lumbar radiculopathy  PROCEDURE:  Procedure(s): Right L4 hemilaminotomy and microdiscectomy, with microdissection, microsurgical technique, and the operating microscope  SURGEON:  Surgeon(s): [name], MD  ANESTHESIA:   general  EBL:  Total I/O In: 1000 [I.V.:1000] Out: 30 [Blood:30]  BLOOD ADMINISTERED:none  COUNT:  Correct per nursing staff  DICTATION: Patient was brought to the operating room and placed under general endotracheal anesthesia. Patient was turned to prone position the lumbar region was prepped with Betadine soap and solution and draped in a sterile fashion. The midline was infiltrated with local anesthetic with epinephrine. A localizing x-ray was taken and the L4-5 level was identified. Midline incision was made over the L4-5 level and was carried down through the subcutaneous tissue to the lumbar fascia. The lumbar fascia was incised on the right side and the paraspinal muscles were dissected from the spinous processes and lamina in a subperiosteal fashion. Another x-ray was taken and the L4-5 intralaminar space was identified. The operating microscope was draped and brought into the field provided additional magnification, illumination, and visualization. Laminotomy was performed using the high-speed drill and Kerrison punches.  Edges of the bone were waxed as necessary to maintain hemostasis.  The ligamentum flavum was carefully resected. The underlying thecal sac and nerve root were identified. The disc herniation was identified  rostral to the L4-5 annulus, compressing the exiting right L4 nerve root within the lateral recess and right L4-5 neuroforamen.  The thecal sac and nerve root gently retracted medially.  We able to mobilize the free fragment disc herniation from the surrounding epidural tissues.  It was removed in a piecemeal fashion using micro-pituitary rongeurs.  All loose factors at this material removed, with good decompression of the thecal sac and exiting right L4 nerve root.  Once the discectomy was completed and good decompression of the thecal sac and nerve had been achieved hemostasis was established with the use of bipolar cautery and Gelfoam with thrombin. The Gelfoam was removed and hemostasis confirmed. We then instilled 2 cc of fentanyl and 80 mg of Depo-Medrol into the epidural space. Deep fascia was closed with interrupted undyed 1 Vicryl sutures. Scarpa's fascia was closed with interrupted undyed 1 Vicryl sutures in the subcutaneous and subcuticular layer were closed with interrupted inverted 2-0 undyed Vicryl sutures. The skin edges were approximated with Dermabond. Following surgery the patient was turned back to a supine position to be reversed from the anesthetic extubated and transferred to the recovery room for further care.  PLAN OF CARE: Admit for overnight observation  PATIENT DISPOSITION:  PACU - hemodynamically stable.   Delay start of Pharmacological VTE agent (>24hrs) due to surgical blood loss or risk of bleeding:  yes

## 2018-03-01 NOTE — Progress Notes (Signed)
Vitals:   03/01/18 1015 03/01/18 1017 03/01/18 1040 03/01/18 1636  BP:  (!) 99/57 121/84 124/62  Pulse: 89 89 90 76  Resp: (!) 9 15 17 16   Temp: 97.8 F (36.6 C)  98.2 F (36.8 C) 98.3 F (36.8 C)  TempSrc:   Oral Oral  SpO2: 97% 97% 95% 100%    Patient resting in bed, comfortable.  Wound clean and dry.  Voiding.  Eating.  Good relief of radicular pain.  Has ambulated in the halls with the staff.  Plan: Doing well following surgery.  Encouraged to ambulate.  Hosie Spangle, MD 03/01/2018, 7:11 PM

## 2018-03-02 ENCOUNTER — Encounter (HOSPITAL_COMMUNITY): Payer: Self-pay | Admitting: Neurosurgery

## 2018-03-02 DIAGNOSIS — M5116 Intervertebral disc disorders with radiculopathy, lumbar region: Secondary | ICD-10-CM | POA: Diagnosis not present

## 2018-03-02 MED ORDER — HYDROCODONE-ACETAMINOPHEN 5-325 MG PO TABS: 1.0000 | ORAL_TABLET | ORAL | 0 refills | Status: DC | PRN

## 2018-03-02 NOTE — Discharge Summary (Signed)
Physician Discharge Summary  Patient ID: Kim [name] MRN: [id number] DOB/AGE: 1935-03-16 83 y.o.  Admit date: 03/01/2018 Discharge date: 03/02/2018  Admission Diagnoses:  Right L4-5 lumbar disc herniation with free fragment migrated rostrally behind the body of L4, lumbar spondylosis, lumbar degenerative disease, right lumbar radiculopathy  Discharge Diagnoses:  Right L4-5 lumbar disc herniation with free fragment migrated rostrally behind the body of L4, lumbar spondylosis, lumbar degenerative disease, right lumbar radiculopathy  Active Problems:   HNP (herniated nucleus pulposus), lumbar   Discharged Condition: good  Hospital Course: Patient was admitted, underwent a right L4-5 discectomy.  She is done well following surgery.  Good relief of the disabling radicular pain, although she does describe some residual aching in the lower extremity bilaterally.  Incision is healing nicely.  She is up and ambulating actively.  She is voiding well.  She is asking to be discharged home.  She, her husband, and her daughter have been given instructions regarding wound care and activities following discharge.  She is scheduled follow-up with me in 3 to 4 weeks.  Discharge Exam: Blood pressure 98/64, pulse 74, temperature 98.8 F (37.1 C), temperature source Oral, resp. rate 18, last menstrual period 10/19/1991, SpO2 100 %.  Disposition: Discharge disposition: 01-Home or Self Care       Discharge Instructions    Discharge wound care:   Complete by:  As directed    Leave the wound open to air. Shower daily with the wound uncovered. Water and soapy water should run over the incision area. Do not wash directly on the incision for 2 weeks. Remove the glue after 2 weeks.   Driving Restrictions   Complete by:  As directed    No driving for 2 weeks. May ride in the car locally now. May begin to drive locally in 2 weeks.   Other Restrictions   Complete by:  As directed    Walk gradually  increasing distances out in the fresh air at least twice a day. Walking additional 6 times inside the house, gradually increasing distances, daily. No bending, lifting, or twisting. Perform activities between shoulder and waist height (that is at counter height when standing or table height when sitting).     Allergies as of 03/02/2018      Reactions   Levaquin [levofloxacin] Hives   Penicillins Swelling   FACIAL SWELLING PATIENT HAS HAD A PCN REACTION WITH IMMEDIATE RASH, FACIAL/TONGUE/THROAT SWELLING, SOB, OR LIGHTHEADEDNESS WITH HYPOTENSION:  #  #  YES  #  #  Has patient had a PCN reaction causing severe rash involving mucus membranes or skin necrosis: No Has patient had a PCN reaction that required hospitalization: No Has patient had a PCN reaction occurring within the last 10 years: No   Propoxyphene N-acetaminophen Swelling   tongue swelling   Statins Other (See Comments)   Joint pain   Cortisone Rash   Evolocumab Other (See Comments)   Myalgias, fatigue, some itching and burning on her feet,  Arthralgia (Joint Pain)   Myrbetriq [mirabegron] Rash   Rash on face   Pseudoephedrine Other (See Comments)   Facial flushing      Medication List    STOP taking these medications   methylPREDNISolone 4 MG Tbpk tablet Commonly known as:  MEDROL DOSEPAK   traMADol 50 MG tablet Commonly known as:  ULTRAM     TAKE these medications   aspirin EC 81 MG tablet Take 81 mg by mouth daily.   CALCIUM-D PO Take 1 tablet  by mouth daily.   exemestane 25 MG tablet Commonly known as:  AROMASIN Take 1 tablet (25 mg total) by mouth daily after breakfast.   ezetimibe 10 MG tablet Commonly known as:  ZETIA Take 10 mg by mouth daily.   HYDROcodone-acetaminophen 5-325 MG tablet Commonly known as:  NORCO/VICODIN Take 1 tablet by mouth every 4 (four) hours as needed (pain).   levothyroxine 112 MCG tablet Commonly known as:  SYNTHROID, LEVOTHROID TAKE 1 TABLET BY MOUTH ONCE DAILY    metoprolol succinate 50 MG 24 hr tablet Commonly known as:  TOPROL-XL TAKE 1 TABLET BY MOUTH 2 TIMES DAILY WITH OR IMMEDIATELY FOLLOWING A MEAL What changed:  See the new instructions.   multivitamin with minerals Tabs tablet Take 1 tablet by mouth daily.            Discharge Care Instructions  (From admission, onward)        Start     Ordered   03/02/18 0000  Discharge wound care:    Comments:  Leave the wound open to air. Shower daily with the wound uncovered. Water and soapy water should run over the incision area. Do not wash directly on the incision for 2 weeks. Remove the glue after 2 weeks.   03/02/18 1010       Signed: Hosie Spangle 03/02/2018, 10:10 AM

## 2018-03-02 NOTE — Progress Notes (Signed)
Pt doing well. Pt and husband given D/C instructions with Rx, verbal understanding. Pt's incision is clean and dry with no sign of infection. Pt's IV was removed prior to D/C. Pt D/C'd home via wheelchair @ 1100 per MD order. Pt is stable at D/C and has no other needs at this time. Holli Humbles, RN

## 2018-03-02 NOTE — Discharge Instructions (Signed)

## 2018-03-06 ENCOUNTER — Ambulatory Visit: Payer: Medicare Other

## 2018-03-06 ENCOUNTER — Ambulatory Visit: Payer: Medicare Other | Admitting: Hematology & Oncology

## 2018-03-06 ENCOUNTER — Other Ambulatory Visit: Payer: Medicare Other

## 2018-03-17 ENCOUNTER — Other Ambulatory Visit: Payer: Self-pay | Admitting: Cardiology

## 2018-03-20 ENCOUNTER — Telehealth: Payer: Self-pay | Admitting: *Deleted

## 2018-03-20 NOTE — Telephone Encounter (Signed)
Patient's daughter is calling the office to relay health updates for her mother's MD appointment next week.   In January the patient had a knee replacement which the daughter feels like she recovered from well. This was followed by a back surgery, approximately 3 weeks ago, for a ruptured disc. The patient is having difficulty after this surgery.   She is having significant issues with pain control. She states her bones ache all the time. Her ambulation is being affected; she is now using a walker. She has had weight loss. Currently her pain is being managed by the neurosurgeon with a combination of tramadol, gabapentin, and tylenol.   All the above relayed to Dr Marin Olp.

## 2018-03-27 ENCOUNTER — Other Ambulatory Visit: Payer: Self-pay

## 2018-03-27 ENCOUNTER — Inpatient Hospital Stay: Payer: Medicare Other | Attending: Hematology & Oncology

## 2018-03-27 ENCOUNTER — Inpatient Hospital Stay (HOSPITAL_BASED_OUTPATIENT_CLINIC_OR_DEPARTMENT_OTHER): Payer: Medicare Other | Admitting: Hematology & Oncology

## 2018-03-27 ENCOUNTER — Encounter: Payer: Self-pay | Admitting: Hematology & Oncology

## 2018-03-27 ENCOUNTER — Inpatient Hospital Stay: Payer: Medicare Other

## 2018-03-27 VITALS — BP 130/61 | HR 57 | Temp 98.1°F | Resp 18 | Wt 145.0 lb

## 2018-03-27 DIAGNOSIS — Z9221 Personal history of antineoplastic chemotherapy: Secondary | ICD-10-CM | POA: Insufficient documentation

## 2018-03-27 DIAGNOSIS — Z170 Estrogen receptor positive status [ER+]: Secondary | ICD-10-CM

## 2018-03-27 DIAGNOSIS — M549 Dorsalgia, unspecified: Secondary | ICD-10-CM

## 2018-03-27 DIAGNOSIS — C50912 Malignant neoplasm of unspecified site of left female breast: Secondary | ICD-10-CM | POA: Diagnosis not present

## 2018-03-27 DIAGNOSIS — Z79811 Long term (current) use of aromatase inhibitors: Secondary | ICD-10-CM | POA: Diagnosis not present

## 2018-03-27 DIAGNOSIS — D0510 Intraductal carcinoma in situ of unspecified breast: Secondary | ICD-10-CM

## 2018-03-27 DIAGNOSIS — C50012 Malignant neoplasm of nipple and areola, left female breast: Secondary | ICD-10-CM

## 2018-03-27 DIAGNOSIS — C50412 Malignant neoplasm of upper-outer quadrant of left female breast: Secondary | ICD-10-CM

## 2018-03-27 DIAGNOSIS — D500 Iron deficiency anemia secondary to blood loss (chronic): Secondary | ICD-10-CM

## 2018-03-27 DIAGNOSIS — M818 Other osteoporosis without current pathological fracture: Secondary | ICD-10-CM

## 2018-03-27 DIAGNOSIS — K909 Intestinal malabsorption, unspecified: Secondary | ICD-10-CM

## 2018-03-27 DIAGNOSIS — Z9889 Other specified postprocedural states: Secondary | ICD-10-CM

## 2018-03-27 LAB — CMP (CANCER CENTER ONLY)
ALT: 27 U/L (ref 10–47)
AST: 19 U/L (ref 11–38)
Albumin: 3.8 g/dL (ref 3.5–5.0)
Alkaline Phosphatase: 46 U/L (ref 26–84)
Anion gap: 10 (ref 5–15)
BUN: 22 mg/dL (ref 7–22)
CO2: 27 mmol/L (ref 18–33)
Calcium: 9.3 mg/dL (ref 8.0–10.3)
Chloride: 103 mmol/L (ref 98–108)
Creatinine: 0.9 mg/dL (ref 0.60–1.20)
Glucose, Bld: 79 mg/dL (ref 73–118)
Potassium: 4.3 mmol/L (ref 3.3–4.7)
Sodium: 140 mmol/L (ref 128–145)
Total Bilirubin: 0.9 mg/dL (ref 0.2–1.6)
Total Protein: 6.9 g/dL (ref 6.4–8.1)

## 2018-03-27 LAB — CBC WITH DIFFERENTIAL (CANCER CENTER ONLY)
Basophils Absolute: 0 10*3/uL (ref 0.0–0.1)
Basophils Relative: 0 %
Eosinophils Absolute: 0.1 10*3/uL (ref 0.0–0.5)
Eosinophils Relative: 1 %
HCT: 42.6 % (ref 34.8–46.6)
Hemoglobin: 13.9 g/dL (ref 11.6–15.9)
Lymphocytes Relative: 21 %
Lymphs Abs: 1.5 10*3/uL (ref 0.9–3.3)
MCH: 32.5 pg (ref 26.0–34.0)
MCHC: 32.6 g/dL (ref 32.0–36.0)
MCV: 99.5 fL (ref 81.0–101.0)
Monocytes Absolute: 0.8 10*3/uL (ref 0.1–0.9)
Monocytes Relative: 11 %
Neutro Abs: 4.7 10*3/uL (ref 1.5–6.5)
Neutrophils Relative %: 67 %
Platelet Count: 163 10*3/uL (ref 145–400)
RBC: 4.28 MIL/uL (ref 3.70–5.32)
RDW: 14.7 % (ref 11.1–15.7)
WBC Count: 7.1 10*3/uL (ref 3.9–10.0)

## 2018-03-27 MED ORDER — MORPHINE SULFATE 4 MG/ML IJ SOLN
2.0000 mg | Freq: Once | INTRAMUSCULAR | Status: AC
  Administered 2018-03-27: 2 mg via SUBCUTANEOUS
  Filled 2018-03-27: qty 1

## 2018-03-27 MED ORDER — MORPHINE SULFATE (PF) 4 MG/ML IV SOLN
INTRAVENOUS | Status: AC
  Filled 2018-03-27: qty 1

## 2018-03-27 MED ORDER — DENOSUMAB 60 MG/ML ~~LOC~~ SOSY
60.0000 mg | PREFILLED_SYRINGE | SUBCUTANEOUS | Status: DC
  Administered 2018-03-27: 60 mg via SUBCUTANEOUS

## 2018-03-27 NOTE — Patient Instructions (Signed)

## 2018-03-27 NOTE — Progress Notes (Signed)
Hematology and Oncology Follow Up Visit  Kim [name] [id number] 02/24/1935 83 y.o. 03/27/2018   Principle Diagnosis:  Stage IIA (T1N1M0) adenocarcinoma of the left breast-triple positive Iron deficiency anemia secondary to blood loss  Current Therapy:   Status post cycle 4 of Abraxane/Herceptin Maintenance Herceptin q 3wk - finish 03/25/2015 Prolia 60gm sq every 6 months - due in Dec 2018 Aromasin 25 mg by mouth daily  - start on 01/10/2017  Feraheme as needed-dose given today     Interim History:  [name] is back for followup.  She is not doing well at all.  She comes in with a rolling walker.  She apparently had lower back surgery about 3 or 4 weeks ago.  She had a right L4-5 discectomy.  This was expertly done by Dr. Sherwood Gambler.  She was doing well for 2 weeks and then all of a sudden she started to have pain down her right leg.  She is miserable.  She, apparently, cannot get any pain medicine from the surgeon's office.  I told her this is just how it is these days.  Doctors are very reluctant to give out pain medication.  Hopefully, she will be able to get into see him this week.  As far as her breast cancer is concerned, she is done well with this.  And now is been 3 years that she has completed her maintenance Herceptin.  She is getting her Prolia today.  She is on Aromasin.  She is doing okay on Aromasin.  Overall, her performance status is ECOG 1.   Medications:  Current Outpatient Medications:  .  aspirin EC 81 MG tablet, Take 81 mg by mouth daily., Disp: , Rfl:  .  Calcium Carbonate-Vitamin D (CALCIUM-D PO), Take 1 tablet by mouth daily., Disp: , Rfl:  .  exemestane (AROMASIN) 25 MG tablet, Take 1 tablet (25 mg total) by mouth daily after breakfast., Disp: 30 tablet, Rfl: 6 .  ezetimibe (ZETIA) 10 MG tablet, Take 10 mg by mouth daily., Disp: , Rfl:  .  gabapentin (NEURONTIN) 100 MG capsule, Take 100 mg by mouth 2 (two) times daily., Disp: , Rfl:  .   HYDROcodone-acetaminophen (NORCO/VICODIN) 5-325 MG tablet, Take 1 tablet by mouth every 4 (four) hours as needed (pain)., Disp: 35 tablet, Rfl: 0 .  levothyroxine (SYNTHROID, LEVOTHROID) 112 MCG tablet, TAKE 1 TABLET BY MOUTH ONCE DAILY, Disp: 90 tablet, Rfl: 1 .  methylPREDNISolone (MEDROL DOSEPAK) 4 MG TBPK tablet, Take 4 mg by mouth., Disp: , Rfl:  .  metoprolol succinate (TOPROL-XL) 50 MG 24 hr tablet, TAKE 1 TABLET BY MOUTH 2 TIMES DAILY WITH OR IMMEDIATELY FOLLOWING A MEAL, Disp: 60 tablet, Rfl: 6 .  Multiple Vitamin (MULTIVITAMIN WITH MINERALS) TABS tablet, Take 1 tablet by mouth daily., Disp: , Rfl:   Current Facility-Administered Medications:  .  morphine 4 MG/ML injection 2 mg, 2 mg, Subcutaneous, Once, Maks Cavallero, Rudell Cobb, MD  Facility-Administered Medications Ordered in Other Visits:  .  [START ON 04/06/2018] denosumab (PROLIA) injection 60 mg, 60 mg, Subcutaneous, Q6 months, Jennfer Gassen, Rudell Cobb, MD  Allergies:  Allergies  Allergen Reactions  . Levaquin [Levofloxacin] Hives  . Penicillins Swelling    FACIAL SWELLING PATIENT HAS HAD A PCN REACTION WITH IMMEDIATE RASH, FACIAL/TONGUE/THROAT SWELLING, SOB, OR LIGHTHEADEDNESS WITH HYPOTENSION:  #  #  YES  #  #  Has patient had a PCN reaction causing severe rash involving mucus membranes or skin necrosis: No Has patient had a PCN  reaction that required hospitalization: No Has patient had a PCN reaction occurring within the last 10 years: No  . Propoxyphene N-Acetaminophen Swelling    tongue swelling  . Statins Other (See Comments)    Joint pain  . Cortisone Rash  . Evolocumab Other (See Comments)    Myalgias, fatigue, some itching and burning on her feet,  Arthralgia (Joint Pain)  . Myrbetriq [Mirabegron] Rash    Rash on face  . Pseudoephedrine Other (See Comments)    Facial flushing    Past Medical History, Surgical history, Social history, and Family History were reviewed and updated.  Review of Systems: Review of Systems   Constitutional: Negative.   HENT: Negative.   Eyes: Negative.   Respiratory: Negative.   Cardiovascular: Negative.   Gastrointestinal: Negative.   Genitourinary: Negative.   Musculoskeletal: Positive for back pain and joint pain.  Skin: Negative.   Neurological: Positive for focal weakness.  Endo/Heme/Allergies: Negative.   Psychiatric/Behavioral: Negative.      Physical Exam:  weight is 145 lb (65.8 kg). Her oral temperature is 98.1 F (36.7 C). Her blood pressure is 130/61 and her pulse is 57 (abnormal). Her respiration is 18 and oxygen saturation is 100%.   Physical Exam  Constitutional: She is oriented to person, place, and time.  HENT:  Head: Normocephalic and atraumatic.  Mouth/Throat: Oropharynx is clear and moist.  Eyes: Pupils are equal, round, and reactive to light. EOM are normal.  Neck: Normal range of motion.  Cardiovascular: Normal rate, regular rhythm and normal heart sounds.  Pulmonary/Chest: Effort normal and breath sounds normal.  Abdominal: Soft. Bowel sounds are normal.  Musculoskeletal: Normal range of motion. She exhibits no edema, tenderness or deformity.  Lymphadenopathy:    She has no cervical adenopathy.  Neurological: She is alert and oriented to person, place, and time.  Skin: Skin is warm and dry. No rash noted. No erythema.  Psychiatric: She has a normal mood and affect. Her behavior is normal. Judgment and thought content normal.  Vitals reviewed.   Lab Results  Component Value Date   WBC 7.1 03/27/2018   HGB 13.9 03/27/2018   HCT 42.6 03/27/2018   MCV 99.5 03/27/2018   PLT 163 03/27/2018     Chemistry      Component Value Date/Time   NA 140 03/27/2018 1142   NA 140 06/09/2017 0758   NA 141 10/11/2016 0844   K 4.3 03/27/2018 1142   K 4.5 06/09/2017 0758   K 4.1 10/11/2016 0844   CL 103 03/27/2018 1142   CL 106 06/09/2017 0758   CO2 27 03/27/2018 1142   CO2 28 06/09/2017 0758   CO2 25 10/11/2016 0844   BUN 22 03/27/2018 1142    BUN 17 06/09/2017 0758   BUN 20.3 10/11/2016 0844   CREATININE 0.90 03/27/2018 1142   CREATININE 1.2 06/09/2017 0758   CREATININE 1.1 10/11/2016 0844      Component Value Date/Time   CALCIUM 9.3 03/27/2018 1142   CALCIUM 9.1 06/09/2017 0758   CALCIUM 9.5 10/11/2016 0844   ALKPHOS 46 03/27/2018 1142   ALKPHOS 49 06/09/2017 0758   ALKPHOS 52 10/11/2016 0844   AST 19 03/27/2018 1142   AST 13 10/11/2016 0844   ALT 27 03/27/2018 1142   ALT 19 06/09/2017 0758   ALT 10 10/11/2016 0844   BILITOT 0.9 03/27/2018 1142   BILITOT 0.83 10/11/2016 0844         Impression and Plan: Kim [name] is 83 year-old white  female with stage IIA ductal carcinoma of the left breast. She had 4 positive lymph nodes. Her breast cancer was triple positive. She completed her adjuvant chemotherapy. She completed this in July of 2015.  She then went on to complete adjuvant Herceptin in June 2016.  It looks like she is doing quite well with the Aromasin. We will continue her on the Aromasin.  The big problem is this back.  Again, I do not believe this is anything related to her cancer.  I am sure that Dr. Sherwood Gambler will be able to help her out.  He is such a good guy and is incredibly compassionate when it comes to his patients.  We will plan to see her back in another 3 months.  I am going to give her a dose of subcutaneous morphine to see if this cannot help some of her discomfort. Marland Kitchen   [name], MD 6/24/20191:32 PM

## 2018-04-17 ENCOUNTER — Encounter: Payer: Self-pay | Admitting: Physical Therapy

## 2018-04-17 ENCOUNTER — Ambulatory Visit: Payer: Medicare Other | Attending: Neurosurgery | Admitting: Physical Therapy

## 2018-04-17 ENCOUNTER — Other Ambulatory Visit: Payer: Self-pay

## 2018-04-17 DIAGNOSIS — M5441 Lumbago with sciatica, right side: Secondary | ICD-10-CM | POA: Insufficient documentation

## 2018-04-17 DIAGNOSIS — G8929 Other chronic pain: Secondary | ICD-10-CM | POA: Diagnosis present

## 2018-04-17 DIAGNOSIS — R2681 Unsteadiness on feet: Secondary | ICD-10-CM | POA: Insufficient documentation

## 2018-04-17 DIAGNOSIS — M6281 Muscle weakness (generalized): Secondary | ICD-10-CM | POA: Insufficient documentation

## 2018-04-17 DIAGNOSIS — R262 Difficulty in walking, not elsewhere classified: Secondary | ICD-10-CM | POA: Diagnosis present

## 2018-04-17 NOTE — Therapy (Signed)
[place], [place], [address] Phone: [phone]   Fax:  [phone]  Physical Therapy Evaluation  Patient Details  Name: Kim [name] MRN: [id number] Date of Birth: 1935-04-09 Referring Provider: [name]    Encounter Date: 04/17/2018  PT End of Session - 04/17/18 1448    Visit Number  1    Number of Visits  17    Date for PT Re-Evaluation  05/15/18    Authorization Type  [employer] Medicare (PN visit 10, KX visit 38)    Authorization Time Period  04/17/18 to 06/18/18    Authorization - Visit Number  1    Authorization - Number of Visits  10    PT Start Time  9476    PT Stop Time  1435    PT Time Calculation (min)  50 min    Activity Tolerance  Patient tolerated treatment well    Behavior During Therapy  [facility] for tasks assessed/performed       Past Medical History:  Diagnosis Date  . Allergic rhinitis   . Allergy   . Arthritis    hands  . Breast cancer of upper-outer quadrant of left female breast ([place]) 11/08/13   finished chemo and radiation 2015  . CAD (coronary artery disease)    a. 12/2013 Cath/PCI: LM nl, LAD 20p, 64m, 30d, D1 30p, LCX 20p, 40m, OM1 20, Mo2 40p, RCA 30p, 12m (4.0x16 Promus Premier DES), 20d, RPL 30, RPDA 70p, 33m, EF 65-70%.  . Diverticulosis   . HOH (hard of hearing)    bilateral, wears hearing aids  . Hx of adenomatous colonic polyps 02/1999  . Hyperlipidemia   . Hypertension   . Hypertrophic obstructive cardiomyopathy (HOCM) [facility])    Cardiologist is Dr. [name]  . Hypothyroidism   . Iron deficiency anemia due to chronic blood loss 01/07/2016  . Low back pain    gets ESI from Dr. Rennis Harding  . Malabsorption of iron 01/07/2016  . Myocardial infarction ([place]) 12/2013   very mild-with first chemo -placed stent x1  . Personal history of radiation therapy 2015  . S/P radiation therapy 05/14/2014-06/26/2014   1) Left Breast  / 50 Gy in 25 fractions, 2) Left Supraclavicular  fossa/ 47.5 Gy in 25 fractions, 3) Left Posterior Axillary boost / 4.825 Gy in 25 fractions, 4) Left Breast boost / 10 Gy in 5 fractions   . SVD (spontaneous vaginal delivery)    x 2  . Tubulovillous adenoma of colon 12/2009   with HGD  . Urinary tract infection   . Wears hearing aid    left and right     Past Surgical History:  Procedure Laterality Date  . ABDOMINAL HYSTERECTOMY    . ANTERIOR AND POSTERIOR REPAIR N/A 01/17/2015   Procedure: CYSTOCELE REPAIR ;  Surgeon: Princess Bruins, MD;  Location: [facility];  Service: Gynecology;  Laterality: N/A;  . BILATERAL SALPINGOOPHORECTOMY     see Laurin Coder NP for GYN exams  . BREAST BIOPSY Left 10/18/2013  . BREAST BIOPSY Left 10/31/2013  . BREAST LUMPECTOMY Left 11/05/2013  . BREAST LUMPECTOMY WITH NEEDLE LOCALIZATION AND AXILLARY LYMPH NODE DISSECTION Left 11/05/2013   Procedure: LEFT BREAST LUMPECTOMY WITH NEEDLE LOCALIZATION and axillary lymph Node Dissection;  Surgeon: Edward Jolly, MD;  Location: [facility];  Service: General;  Laterality: Left;  . BREAST SURGERY  11/2013   left lumpectomy  . CATARACT EXTRACTION W/ INTRAOCULAR LENS  IMPLANT, BILATERAL  2012   bilateral  . COLONOSCOPY  11/04/2015   per Dr. Fuller Plan, adenomatous polyps, no repeats planned   . EYE SURGERY  11/22/2006   cataracts, bilateral, intraocular lens implant  . JOINT REPLACEMENT     2019  . LEFT HEART CATHETERIZATION WITH CORONARY ANGIOGRAM N/A 12/19/2013   Procedure: LEFT HEART CATHETERIZATION WITH CORONARY ANGIOGRAM;  Surgeon: Burnell Blanks, MD;  Location: [facility] CATH LAB;  Service: Cardiovascular;  Laterality: N/A;  . lumbar epidural steroid injection     lumber spinal stenosis  . LUMBAR LAMINECTOMY/DECOMPRESSION MICRODISCECTOMY Right 03/01/2018   Procedure: RIGHT LUMBAR FOUR- LUMBAR FIVE LAMINECTOMY/MICRODISCECTOMY;  Surgeon: [name], MD;  Location: [facility];  Service: Neurosurgery;  Laterality: Right;  . POLYPECTOMY    . PORTACATH  PLACEMENT Right 12/11/2013   Procedure: INSERTION PORT-A-CATH;  Surgeon: Edward Jolly, MD;  Location: [facility];  Service: General;  Laterality: Right;  Subclavian Vein;   . TOTAL KNEE ARTHROPLASTY Right 10/24/2017   Procedure: TOTAL RIGHT KNEE ARTHROPLASTY;  Surgeon: Gaynelle Arabian, MD;  Location: [facility];  Service: Orthopedics;  Laterality: Right;    There were no vitals filed for this visit.   Subjective Assessment - 04/17/18 1351    Subjective  I had a L4-L5 laminectomy May 29th 2019; at first I felt very good for about the first 2 weeks, then something "clicked" and my pain got worse. Now I have symptoms going down my right leg. THey did an MRI and an X-ray last friday and I was told everything looked good. Sometimes I feel dizzy but I think its the medicine; I did fall one time geting out of the shower, when I put my weight on my right foot I kind of just went down.     How long can you sit comfortably?  hurts- unable to give specific time     How long can you stand comfortably?  cannot stand long- not even 5-10 minutes     How long can you walk comfortably?  can't walk at all without walker     Patient Stated Goals  walk without walker, get out of pain     Currently in Pain?  Yes    Pain Score  8     Pain Location  Leg    Pain Orientation  Right    Pain Descriptors / Indicators  Tingling;Numbness;Throbbing    Pain Type  Chronic pain    Pain Radiating Towards  down R LE     Pain Onset  More than a month ago    Pain Frequency  Constant    Aggravating Factors   standing    Pain Relieving Factors  "get away from this pain", not much     Effect of Pain on Daily Activities  severe         [facility] PT Assessment - 04/17/18 0001      Assessment   Medical Diagnosis  back pain     Referring Provider  [name]     Onset Date/Surgical Date  03/01/18    Next MD Visit  Dr. Sherwood Gambler 2nd week October 2019    Prior Therapy  PT for my knee       Precautions    Precautions  Back    Precaution Comments  assuming BLT precautions, VERY HOH     Required Braces or Orthoses  Spinal Brace    Spinal Brace  Lumbar corset      Restrictions   Weight Bearing  Restrictions  No      Balance Screen   Has the patient fallen in the past 6 months  Yes    How many times?  1    Has the patient had a decrease in activity level because of a fear of falling?   Yes    Is the patient reluctant to leave their home because of a fear of falling?   No      Prior Function   Level of Independence  Independent;Independent with basic ADLs;Independent with gait;Requires assistive device for independence;Independent with transfers    Vocation  Retired      ROM / Strength   AROM / PROM / Strength  AROM;Strength      Strength   Overall Strength Comments  hip extensor strength 2/5 based on functional bridge     Strength Assessment Site  Hip;Knee;Ankle    Right/Left Hip  Right;Left    Right Hip Flexion  3/5    Right Hip ABduction  3/5    Left Hip Flexion  3/5    Left Hip ABduction  4/5    Right/Left Knee  Right;Left    Right Knee Flexion  4/5    Right Knee Extension  3+/5    Left Knee Flexion  4/5    Left Knee Extension  3+/5    Right/Left Ankle  Right;Left    Right Ankle Dorsiflexion  5/5    Left Ankle Dorsiflexion  5/5      Ambulation/Gait   Ambulation/Gait  Yes    Ambulation/Gait Assistance  6: Modified independent (Device/Increase time)    Assistive device  Rolling walker    Gait Pattern  Step-through pattern;Decreased arm swing - right;Decreased arm swing - left;Decreased step length - right;Decreased step length - left;Decreased hip/knee flexion - right;Decreased hip/knee flexion - left;Decreased trunk rotation;Trunk flexed;Narrow base of support      Balance   Balance Assessed  Yes      Standardized Balance Assessment   Standardized Balance Assessment  Berg Balance Test      Berg Balance Test   Sit to Stand  Able to stand  independently using hands     Standing Unsupported  Able to stand 2 minutes with supervision    Sitting with Back Unsupported but Feet Supported on Floor or Stool  Able to sit safely and securely 2 minutes    Stand to Sit  Uses backs of legs against chair to control descent    Transfers  Able to transfer safely, definite need of hands    Standing Unsupported with Eyes Closed  Able to stand 10 seconds with supervision    Standing Ubsupported with Feet Together  Able to place feet together independently but unable to hold for 30 seconds    From Standing, Reach Forward with Outstretched Arm  Can reach confidently >25 cm (10")    From Standing Position, Pick up Object from Floor  Able to pick up shoe, needs supervision    From Standing Position, Turn to Look Behind Over each Shoulder  Needs supervision when turning    Turn 360 Degrees  Needs close supervision or verbal cueing    Standing Unsupported, Alternately Place Feet on Step/Stool  Needs assistance to keep from falling or unable to try    Standing Unsupported, One Foot in Front  Needs help to step but can hold 15 seconds    Standing on One Leg  Tries to lift leg/unable to hold 3 seconds but remains standing independently  Total Score  31                Objective measurements completed on examination: See above findings.      [facility] Adult PT Treatment/Exercise - 04/17/18 0001      Exercises   Exercises  Knee/Hip      Knee/Hip Exercises: Standing   Heel Raises  Both;1 set;10 reps      Knee/Hip Exercises: Seated   Long Arc Quad  Both;1 set;10 reps;Other (comment)    Long Arc Quad Limitations  yellow TB     Clamshell with TheraBand  Yellow 1x10             PT Education - 04/17/18 1448    Education Details  exam findings, POC, HEP, prognosis     Person(s) Educated  Patient    Methods  Explanation;Demonstration;Handout;Verbal cues    Comprehension  Verbalized understanding;Returned demonstration       PT Short Term Goals - 04/17/18 1451       PT SHORT TERM GOAL #1   Title  Patient to be independent with appropriate HEP, to be updated PRN     Time  4    Period  Weeks    Status  New    Target Date  05/15/18      PT SHORT TERM GOAL #2   Title  Patient to demonstrate improved functional strength as evidenced by ability to peform sit to stand without use of UEs    Time  4    Period  Weeks    Status  New      PT SHORT TERM GOAL #3   Title  Patient to score at least 40 on Berg balance test to demonstrate reduced risk of falling and improved functional balance skills     Time  4    Period  Weeks    Status  New      PT SHORT TERM GOAL #4   Title  Patient to be able to ambulate house hold distances with [facility] and minimal unsteadiness in order to show improved functional mobility and balance     Time  4    Status  New        PT Long Term Goals - 04/17/18 1453      PT LONG TERM GOAL #1   Title  Patient to demonstrate MMT as being at least 4/5 in all tested muscle groups in order to show improved functional strength and stability     Time  8    Period  Weeks    Status  New    Target Date  06/12/18      PT LONG TERM GOAL #2   Title  Patient to score at least 45/56 on the Berg in order to show improved stability and reduced fall risk     Time  8    Period  Weeks    Status  New      PT LONG TERM GOAL #3   Title  Patient to be able to walk household distances without assistive device and will be able to safely ambulate with SPC in the community with mnimal unsteadiness and good safety awareness in order to show improved mobility and safety    Time  8    Period  Weeks    Status  New      PT LONG TERM GOAL #4   Title  Patient to report pain as being no more than 4/10 at worst  in order to improve QOL and functional task tolerance     Time  8    Period  Weeks    Status  New             Plan - 04/17/18 1449    Clinical Impression Statement  Patient presents approximately 7 weeks following R L4-L5  laminectomy/microdiscectomy which was performed on May 29th 2019. She reports she felt good for about 2 weeks but it has been downhill since then and she has gotten very weak and has almost fallen. She continues to wear her Aspen quickdraw lumbar brace. Examination reveals severe functional muscle weakness, significant gait deviation, and very poor balance as evidenced by a score of 31/56 on the Berg. She is very limited by pain and required multiple rest breaks throughout today's evaluation due to pain. She will benefit from skilled PT services to address functional deficits, reduce pain, and reduce fall risk.     History and Personal Factors relevant to plan of care:  hx breast CA, hx TKR, lumbar laminectomy May 2019     Clinical Presentation  Stable    Clinical Decision Making  Low    Rehab Potential  Fair    PT Frequency  2x / week    PT Duration  8 weeks    PT Treatment/Interventions  ADLs/Self Care Home Management;Biofeedback;Cryotherapy;Electrical Stimulation;Iontophoresis 4mg /ml Dexamethasone;Moist Heat;Traction;Ultrasound;DME Instruction;Gait training;Stair training;Functional mobility training;Therapeutic activities;Therapeutic exercise;Balance training;Neuromuscular re-education;Patient/family education;Manual techniques;Passive range of motion;Dry needling;Taping;Energy conservation    PT Next Visit Plan  review HEP and goals, expand HEP as appropriate. Focus on strength and balance, core.     PT Home Exercise Plan  Eval: LAQs with yellow TB, seated clams with yellow TB, standing heel raises     Consulted and Agree with Plan of Care  Patient       Patient will benefit from skilled therapeutic intervention in order to improve the following deficits and impairments:  Abnormal gait, Improper body mechanics, Pain, Postural dysfunction, Decreased activity tolerance, Decreased strength, Decreased balance, Decreased knowledge of use of DME, Hypomobility, Impaired perceived functional ability,  Decreased knowledge of precautions, Decreased safety awareness, Difficulty walking, Impaired flexibility  Visit Diagnosis: Chronic bilateral low back pain with right-sided sciatica - Plan: PT plan of care cert/re-cert  Muscle weakness (generalized) - Plan: PT plan of care cert/re-cert  Unsteadiness on feet - Plan: PT plan of care cert/re-cert  Difficulty in walking, not elsewhere classified - Plan: PT plan of care cert/re-cert     Problem List Patient Active Problem List   Diagnosis Date Noted  . HNP (herniated nucleus pulposus), lumbar 03/01/2018  . History of total knee replacement, right 11/13/2017  . OA (osteoarthritis) of knee 10/24/2017  . Chronic pain of right knee 06/17/2017  . Osteoporosis 07/30/2016  . Iron deficiency anemia due to chronic blood loss 01/07/2016  . Malabsorption of iron 01/07/2016  . Postoperative state 01/17/2015  . HOCM (hypertrophic obstructive cardiomyopathy) ([place]) 01/09/2014  . Breast cancer ([place]) 01/09/2014  . Coronary atherosclerosis of native coronary artery 12/20/2013  . CAD (coronary artery disease)   . Hyperlipidemia   . Unstable angina ([place]) 12/19/2013  . Breast cancer of upper-outer quadrant of left female breast ([place]) 11/05/2013  . DCIS (ductal carcinoma in situ) of breast 10/26/2013  . Hypertension 04/05/2012  . Cardiac murmur 04/05/2012  . UNSPECIFIED HEARING LOSS 08/07/2010  . LOW BACK PAIN 04/22/2009  . CONSTIPATION 11/04/2008  . COLONIC POLYPS, HX OF 11/01/2008  . ALLERGIC RHINITIS 06/27/2007  .  Hypothyroidism 06/22/2007    Deniece Ree PT, DPT, [place]  Supplemental Physical Therapist [place]   Pager [facility] [address] [place], [place], [address] Phone: [phone]   Fax:  [phone]  Name: LEIALOHA HANNA MRN: [id number] Date of Birth: [birth year]

## 2018-04-17 NOTE — Patient Instructions (Signed)
   Seated LAQ with band  Start sitting with band under arch of unaffected leg and around ankle of affected leg. Straighten your leg and slowly return to starting position keeping knees tight together.   Repeat 10 times each leg with yellow band, 2-3 times a day.     ELASTIC BAND - SEATED CLAMS  While sitting in a chair and an elastic band wrapped around your knees, move both knees to the sides to separate your legs. Keep contact of your feet on the floor the entire time.   Repeat 10 times with yellow band, 2-3 times per day.     STANDING HEEL RAISES  While standing, raise up on your toes as you lift your heels off the ground.  Repeat 10 times, 2-3 times per day.

## 2018-04-20 ENCOUNTER — Encounter: Payer: Self-pay | Admitting: Physical Therapy

## 2018-04-20 ENCOUNTER — Ambulatory Visit: Payer: Medicare Other | Admitting: Physical Therapy

## 2018-04-20 DIAGNOSIS — R2681 Unsteadiness on feet: Secondary | ICD-10-CM

## 2018-04-20 DIAGNOSIS — M5441 Lumbago with sciatica, right side: Principal | ICD-10-CM

## 2018-04-20 DIAGNOSIS — R262 Difficulty in walking, not elsewhere classified: Secondary | ICD-10-CM

## 2018-04-20 DIAGNOSIS — M6281 Muscle weakness (generalized): Secondary | ICD-10-CM

## 2018-04-20 DIAGNOSIS — G8929 Other chronic pain: Secondary | ICD-10-CM

## 2018-04-20 NOTE — Therapy (Signed)
[place], [place], [address] Phone: [phone]   Fax:  [phone]  Physical Therapy Treatment  Patient Details  Name: Kim [name] MRN: [id number] Date of Birth: 08-28-1935 Referring Provider: [name]    Encounter Date: 04/20/2018  PT End of Session - 04/20/18 1143    Visit Number  2    Number of Visits  17    Date for PT Re-Evaluation  05/15/18    Authorization Type  [employer] Medicare (PN visit 10, KX visit 45)    Authorization Time Period  04/17/18 to 06/18/18    Authorization - Visit Number  2    Authorization - Number of Visits  10    PT Start Time  1107 patient in restroom     PT Stop Time  1143    PT Time Calculation (min)  36 min    Activity Tolerance  Patient tolerated treatment well    Behavior During Therapy  [facility] for tasks assessed/performed       Past Medical History:  Diagnosis Date  . Allergic rhinitis   . Allergy   . Arthritis    hands  . Breast cancer of upper-outer quadrant of left female breast ([place]) 11/08/13   finished chemo and radiation 2015  . CAD (coronary artery disease)    a. 12/2013 Cath/PCI: LM nl, LAD 20p, 60m, 30d, D1 30p, LCX 20p, 11m, OM1 20, Mo2 40p, RCA 30p, 75m (4.0x16 Promus Premier DES), 20d, RPL 30, RPDA 70p, 63m, EF 65-70%.  . Diverticulosis   . HOH (hard of hearing)    bilateral, wears hearing aids  . Hx of adenomatous colonic polyps 02/1999  . Hyperlipidemia   . Hypertension   . Hypertrophic obstructive cardiomyopathy (HOCM) [facility])    Cardiologist is Dr. [name]  . Hypothyroidism   . Iron deficiency anemia due to chronic blood loss 01/07/2016  . Low back pain    gets ESI from Dr. Rennis Harding  . Malabsorption of iron 01/07/2016  . Myocardial infarction ([place]) 12/2013   very mild-with first chemo -placed stent x1  . Personal history of radiation therapy 2015  . S/P radiation therapy 05/14/2014-06/26/2014   1) Left Breast  / 50 Gy in 25 fractions, 2)  Left Supraclavicular fossa/ 47.5 Gy in 25 fractions, 3) Left Posterior Axillary boost / 4.825 Gy in 25 fractions, 4) Left Breast boost / 10 Gy in 5 fractions   . SVD (spontaneous vaginal delivery)    x 2  . Tubulovillous adenoma of colon 12/2009   with HGD  . Urinary tract infection   . Wears hearing aid    left and right     Past Surgical History:  Procedure Laterality Date  . ABDOMINAL HYSTERECTOMY    . ANTERIOR AND POSTERIOR REPAIR N/A 01/17/2015   Procedure: CYSTOCELE REPAIR ;  Surgeon: Princess Bruins, MD;  Location: [facility];  Service: Gynecology;  Laterality: N/A;  . BILATERAL SALPINGOOPHORECTOMY     see Laurin Coder NP for GYN exams  . BREAST BIOPSY Left 10/18/2013  . BREAST BIOPSY Left 10/31/2013  . BREAST LUMPECTOMY Left 11/05/2013  . BREAST LUMPECTOMY WITH NEEDLE LOCALIZATION AND AXILLARY LYMPH NODE DISSECTION Left 11/05/2013   Procedure: LEFT BREAST LUMPECTOMY WITH NEEDLE LOCALIZATION and axillary lymph Node Dissection;  Surgeon: Edward Jolly, MD;  Location: [place];  Service: General;  Laterality: Left;  . BREAST SURGERY  11/2013   left lumpectomy  . CATARACT EXTRACTION W/ INTRAOCULAR  LENS  IMPLANT, BILATERAL  2012   bilateral  . COLONOSCOPY  11/04/2015   per Dr. Fuller Plan, adenomatous polyps, no repeats planned   . EYE SURGERY  11/22/2006   cataracts, bilateral, intraocular lens implant  . JOINT REPLACEMENT     2019  . LEFT HEART CATHETERIZATION WITH CORONARY ANGIOGRAM N/A 12/19/2013   Procedure: LEFT HEART CATHETERIZATION WITH CORONARY ANGIOGRAM;  Surgeon: Burnell Blanks, MD;  Location: [facility] CATH LAB;  Service: Cardiovascular;  Laterality: N/A;  . lumbar epidural steroid injection     lumber spinal stenosis  . LUMBAR LAMINECTOMY/DECOMPRESSION MICRODISCECTOMY Right 03/01/2018   Procedure: RIGHT LUMBAR FOUR- LUMBAR FIVE LAMINECTOMY/MICRODISCECTOMY;  Surgeon: [name], MD;  Location: [place];  Service: Neurosurgery;  Laterality: Right;  .  POLYPECTOMY    . PORTACATH PLACEMENT Right 12/11/2013   Procedure: INSERTION PORT-A-CATH;  Surgeon: Edward Jolly, MD;  Location: [place];  Service: General;  Laterality: Right;  Subclavian Vein;   . TOTAL KNEE ARTHROPLASTY Right 10/24/2017   Procedure: TOTAL RIGHT KNEE ARTHROPLASTY;  Surgeon: Gaynelle Arabian, MD;  Location[facility];  Service: Orthopedics;  Laterality: Right;    There were no vitals filed for this visit.  Subjective Assessment - 04/20/18 1105    Subjective  The exercises have been helping, I feel a litle better. I had a lot of pain yesterday, I couldn't get rid of it. My right leg was very painful. Ice helped calm it down.     Patient Stated Goals  walk without walker, get out of pain     Currently in Pain?  Yes    Pain Score  5     Pain Location  Leg    Pain Orientation  Right    Pain Descriptors / Indicators  Tingling;Numbness;Throbbing                       [facility] Adult PT Treatment/Exercise - 04/20/18 0001      Knee/Hip Exercises: Standing   Heel Raises  Both;1 set;10 reps heel and toe     Hip Flexion  Both;1 set;10 reps    Hip Abduction  Both;1 set;10 reps    Gait Training  gait in clinic with cues for heel-toe and upright posture       Knee/Hip Exercises: Seated   Long Arc Quad  Both;1 set;10 reps    Long Arc Quad Limitations  red TB     Marching  Both;1 set;10 reps;Other (comment)    Marching Limitations  red TB     Hamstring Curl  Both;1 set;10 reps;Other (comment)    Hamstring Limitations  red TB     Sit to Sand  10 reps;with UE support focus on safety and controlled lower              PT Education - 04/20/18 1143    Education Details  importance of soft, controlled landing; safety with transfers and use of walker; exercise form and purpose     Person(s) Educated  Patient    Methods  Explanation;Demonstration    Comprehension  Verbalized understanding;Need further instruction       PT Short Term Goals -  04/17/18 1451      PT SHORT TERM GOAL #1   Title  Patient to be independent with appropriate HEP, to be updated PRN     Time  4    Period  Weeks    Status  New    Target Date  05/15/18  PT SHORT TERM GOAL #2   Title  Patient to demonstrate improved functional strength as evidenced by ability to peform sit to stand without use of UEs    Time  4    Period  Weeks    Status  New      PT SHORT TERM GOAL #3   Title  Patient to score at least 40 on Berg balance test to demonstrate reduced risk of falling and improved functional balance skills     Time  4    Period  Weeks    Status  New      PT SHORT TERM GOAL #4   Title  Patient to be able to ambulate house hold distances with [facility] and minimal unsteadiness in order to show improved functional mobility and balance     Time  4    Status  New        PT Long Term Goals - 04/17/18 1453      PT LONG TERM GOAL #1   Title  Patient to demonstrate MMT as being at least 4/5 in all tested muscle groups in order to show improved functional strength and stability     Time  8    Period  Weeks    Status  New    Target Date  06/12/18      PT LONG TERM GOAL #2   Title  Patient to score at least 45/56 on the Berg in order to show improved stability and reduced fall risk     Time  8    Period  Weeks    Status  New      PT LONG TERM GOAL #3   Title  Patient to be able to walk household distances without assistive device and will be able to safely ambulate with SPC in the community with mnimal unsteadiness and good safety awareness in order to show improved mobility and safety    Time  8    Period  Weeks    Status  New      PT LONG TERM GOAL #4   Title  Patient to report pain as being no more than 4/10 at worst in order to improve QOL and functional task tolerance     Time  8    Period  Weeks    Status  New            Plan - 04/20/18 1144    Clinical Impression Statement  Patient arrives today reporting that she is feeling  better after starting the exercises. Of note, she tends to have poor safety awareness and tends to "crash" into chairs, which may be aggravating her pain. Provided education and functional training in this area, otherwise focused on functional strengthening and balance as time allowed this session. Attempted supine exercises with HOB elevated but patient unable to tolerate supine position even with head elevated. Encouraged patient to follow up with MD regarding questions about pain medicines and dosing. Patient very easily fatigued and required multiple breaks during session.     Rehab Potential  Fair    PT Frequency  2x / week    PT Duration  8 weeks    PT Treatment/Interventions  ADLs/Self Care Home Management;Biofeedback;Cryotherapy;Electrical Stimulation;Iontophoresis 4mg /ml Dexamethasone;Moist Heat;Traction;Ultrasound;DME Instruction;Gait training;Stair training;Functional mobility training;Therapeutic activities;Therapeutic exercise;Balance training;Neuromuscular re-education;Patient/family education;Manual techniques;Passive range of motion;Dry needling;Taping;Energy conservation    PT Next Visit Plan  f/u on controlled sitting and safety; strength and balance, core     PT Home  Exercise Plan  Eval: LAQs with yellow TB, seated clams with yellow TB, standing heel raises     Consulted and Agree with Plan of Care  Patient       Patient will benefit from skilled therapeutic intervention in order to improve the following deficits and impairments:  Abnormal gait, Improper body mechanics, Pain, Postural dysfunction, Decreased activity tolerance, Decreased strength, Decreased balance, Decreased knowledge of use of DME, Hypomobility, Impaired perceived functional ability, Decreased knowledge of precautions, Decreased safety awareness, Difficulty walking, Impaired flexibility  Visit Diagnosis: Chronic bilateral low back pain with right-sided sciatica  Muscle weakness (generalized)  Unsteadiness on  feet  Difficulty in walking, not elsewhere classified     Problem List Patient Active Problem List   Diagnosis Date Noted  . HNP (herniated nucleus pulposus), lumbar 03/01/2018  . History of total knee replacement, right 11/13/2017  . OA (osteoarthritis) of knee 10/24/2017  . Chronic pain of right knee 06/17/2017  . Osteoporosis 07/30/2016  . Iron deficiency anemia due to chronic blood loss 01/07/2016  . Malabsorption of iron 01/07/2016  . Postoperative state 01/17/2015  . HOCM (hypertrophic obstructive cardiomyopathy) ([place]) 01/09/2014  . Breast cancer ([place]) 01/09/2014  . Coronary atherosclerosis of native coronary artery 12/20/2013  . CAD (coronary artery disease)   . Hyperlipidemia   . Unstable angina ([place]) 12/19/2013  . Breast cancer of upper-outer quadrant of left female breast ([place]) 11/05/2013  . DCIS (ductal carcinoma in situ) of breast 10/26/2013  . Hypertension 04/05/2012  . Cardiac murmur 04/05/2012  . UNSPECIFIED HEARING LOSS 08/07/2010  . LOW BACK PAIN 04/22/2009  . CONSTIPATION 11/04/2008  . COLONIC POLYPS, HX OF 11/01/2008  . ALLERGIC RHINITIS 06/27/2007  . Hypothyroidism 06/22/2007    Deniece Ree PT, DPT, [place]  Supplemental Physical Therapist [place]   Pager [place] [facility] [address] [place], [place], [address] Phone: [phone]   Fax:  [phone]  Name: Kim [name] MRN: [id number] Date of Birth: 01-29-1935

## 2018-05-02 ENCOUNTER — Ambulatory Visit: Payer: Medicare Other | Admitting: Physical Therapy

## 2018-05-02 ENCOUNTER — Encounter: Payer: Self-pay | Admitting: Physical Therapy

## 2018-05-02 DIAGNOSIS — G8929 Other chronic pain: Secondary | ICD-10-CM

## 2018-05-02 DIAGNOSIS — M5441 Lumbago with sciatica, right side: Principal | ICD-10-CM

## 2018-05-02 DIAGNOSIS — M6281 Muscle weakness (generalized): Secondary | ICD-10-CM

## 2018-05-02 DIAGNOSIS — R2681 Unsteadiness on feet: Secondary | ICD-10-CM

## 2018-05-02 NOTE — Therapy (Signed)
[place], [place], [address] Phone: [phone]   Fax:  [phone]  Physical Therapy Treatment  Patient Details  Name: Kim [name] MRN: [id number] Date of Birth: 1935/04/28 Referring Provider: [name]    Encounter Date: 05/02/2018  PT End of Session - 05/02/18 1445    Visit Number  3    Number of Visits  17    Date for PT Re-Evaluation  05/15/18    Authorization Type  [employer] Medicare (PN visit 10, KX visit 34)    Authorization Time Period  04/17/18 to 06/18/18    Authorization - Visit Number  3    Authorization - Number of Visits  10    PT Start Time  4034    PT Stop Time  1523    PT Time Calculation (min)  38 min    Activity Tolerance  Patient tolerated treatment well    Behavior During Therapy  [facility] for tasks assessed/performed       Past Medical History:  Diagnosis Date  . Allergic rhinitis   . Allergy   . Arthritis    hands  . Breast cancer of upper-outer quadrant of left female breast ([place]) 11/08/13   finished chemo and radiation 2015  . CAD (coronary artery disease)    a. 12/2013 Cath/PCI: LM nl, LAD 20p, 47m, 30d, D1 30p, LCX 20p, 71m, OM1 20, Mo2 40p, RCA 30p, 11m (4.0x16 Promus Premier DES), 20d, RPL 30, RPDA 70p, 10m, EF 65-70%.  . Diverticulosis   . HOH (hard of hearing)    bilateral, wears hearing aids  . Hx of adenomatous colonic polyps 02/1999  . Hyperlipidemia   . Hypertension   . Hypertrophic obstructive cardiomyopathy (HOCM) [facility])    Cardiologist is Dr. [name]  . Hypothyroidism   . Iron deficiency anemia due to chronic blood loss 01/07/2016  . Low back pain    gets ESI from Dr. Rennis Harding  . Malabsorption of iron 01/07/2016  . Myocardial infarction ([place]) 12/2013   very mild-with first chemo -placed stent x1  . Personal history of radiation therapy 2015  . S/P radiation therapy 05/14/2014-06/26/2014   1) Left Breast  / 50 Gy in 25 fractions, 2) Left Supraclavicular  fossa/ 47.5 Gy in 25 fractions, 3) Left Posterior Axillary boost / 4.825 Gy in 25 fractions, 4) Left Breast boost / 10 Gy in 5 fractions   . SVD (spontaneous vaginal delivery)    x 2  . Tubulovillous adenoma of colon 12/2009   with HGD  . Urinary tract infection   . Wears hearing aid    left and right     Past Surgical History:  Procedure Laterality Date  . ABDOMINAL HYSTERECTOMY    . ANTERIOR AND POSTERIOR REPAIR N/A 01/17/2015   Procedure: CYSTOCELE REPAIR ;  Surgeon: Princess Bruins, MD;  Location: [facility];  Service: Gynecology;  Laterality: N/A;  . BILATERAL SALPINGOOPHORECTOMY     see Laurin Coder NP for GYN exams  . BREAST BIOPSY Left 10/18/2013  . BREAST BIOPSY Left 10/31/2013  . BREAST LUMPECTOMY Left 11/05/2013  . BREAST LUMPECTOMY WITH NEEDLE LOCALIZATION AND AXILLARY LYMPH NODE DISSECTION Left 11/05/2013   Procedure: LEFT BREAST LUMPECTOMY WITH NEEDLE LOCALIZATION and axillary lymph Node Dissection;  Surgeon: Edward Jolly, MD;  Location: [facility];  Service: General;  Laterality: Left;  . BREAST SURGERY  11/2013   left lumpectomy  . CATARACT EXTRACTION W/ INTRAOCULAR LENS  IMPLANT, BILATERAL  2012   bilateral  . COLONOSCOPY  11/04/2015   per Dr. Fuller Plan, adenomatous polyps, no repeats planned   . EYE SURGERY  11/22/2006   cataracts, bilateral, intraocular lens implant  . JOINT REPLACEMENT     2019  . LEFT HEART CATHETERIZATION WITH CORONARY ANGIOGRAM N/A 12/19/2013   Procedure: LEFT HEART CATHETERIZATION WITH CORONARY ANGIOGRAM;  Surgeon: Burnell Blanks, MD;  Location: [facility] CATH LAB;  Service: Cardiovascular;  Laterality: N/A;  . lumbar epidural steroid injection     lumber spinal stenosis  . LUMBAR LAMINECTOMY/DECOMPRESSION MICRODISCECTOMY Right 03/01/2018   Procedure: RIGHT LUMBAR FOUR- LUMBAR FIVE LAMINECTOMY/MICRODISCECTOMY;  Surgeon: [name], MD;  Location: [facility];  Service: Neurosurgery;  Laterality: Right;  . POLYPECTOMY    . PORTACATH  PLACEMENT Right 12/11/2013   Procedure: INSERTION PORT-A-CATH;  Surgeon: Edward Jolly, MD;  Location: [place];  Service: General;  Laterality: Right;  Subclavian Vein;   . TOTAL KNEE ARTHROPLASTY Right 10/24/2017   Procedure: TOTAL RIGHT KNEE ARTHROPLASTY;  Surgeon: Gaynelle Arabian, MD;  Location: [facility];  Service: Orthopedics;  Laterality: Right;    There were no vitals filed for this visit.  Subjective Assessment - 05/02/18 1446    Subjective  My back is really hurting now across the bottom but my leg is not hurting anymore.     Patient Stated Goals  walk without walker, get out of pain     Currently in Pain?  Yes    Pain Score  9     Pain Location  Back    Pain Orientation  Right;Left;Lower    Pain Descriptors / Indicators  Aching                       [facility] Adult PT Treatment/Exercise - 05/02/18 0001      Knee/Hip Exercises: Stretches   Other Knee/Hip Stretches  small LTR with ball bw knees      Knee/Hip Exercises: Standing   Heel Raises  Both;1 set;10 reps    Other Standing Knee Exercises  glut sets 5s holds x10      Knee/Hip Exercises: Seated   Long Arc Quad  Both;10 reps;1 set    [employer]    Clamshell with TheraBand  Yellow    Other Seated Knee/Hip Exercises  press into physioball for abdominal engagement    Sit to Sand  10 reps UE support to stand, no UE to sit      Knee/Hip Exercises: Supine   Other Supine Knee/Hip Exercises  marching    Other Supine Knee/Hip Exercises  alt heel slides      Knee/Hip Exercises: Sidelying   Clams  both x15               PT Short Term Goals - 04/17/18 1451      PT SHORT TERM GOAL #1   Title  Patient to be independent with appropriate HEP, to be updated PRN     Time  4    Period  Weeks    Status  New    Target Date  05/15/18      PT SHORT TERM GOAL #2   Title  Patient to demonstrate improved functional strength as evidenced by ability to peform sit to stand without use  of UEs    Time  4    Period  Weeks    Status  New      PT SHORT TERM GOAL #3  Title  Patient to score at least 40 on Berg balance test to demonstrate reduced risk of falling and improved functional balance skills     Time  4    Period  Weeks    Status  New      PT SHORT TERM GOAL #4   Title  Patient to be able to ambulate house hold distances with [facility] and minimal unsteadiness in order to show improved functional mobility and balance     Time  4    Status  New        PT Long Term Goals - 04/17/18 1453      PT LONG TERM GOAL #1   Title  Patient to demonstrate MMT as being at least 4/5 in all tested muscle groups in order to show improved functional strength and stability     Time  8    Period  Weeks    Status  New    Target Date  06/12/18      PT LONG TERM GOAL #2   Title  Patient to score at least 45/56 on the Berg in order to show improved stability and reduced fall risk     Time  8    Period  Weeks    Status  New      PT LONG TERM GOAL #3   Title  Patient to be able to walk household distances without assistive device and will be able to safely ambulate with SPC in the community with mnimal unsteadiness and good safety awareness in order to show improved mobility and safety    Time  8    Period  Weeks    Status  New      PT LONG TERM GOAL #4   Title  Patient to report pain as being no more than 4/10 at worst in order to improve QOL and functional task tolerance     Time  8    Period  Weeks    Status  New            Plan - 05/02/18 1523    Clinical Impression Statement  Pt was able to tolerate hooklying position for 2 exercises without increase in low back pain. Denied any pain into leg today. Will require strengthening for core to support back. Asked her to utilize glut contraction to stand upright rather than arching to bring shoulders back. Asked her to wear closed-toed shoes next visit for safety when walking without walker    PT Treatment/Interventions   ADLs/Self Care Home Management;Biofeedback;Cryotherapy;Electrical Stimulation;Iontophoresis 4mg /ml Dexamethasone;Moist Heat;Traction;Ultrasound;DME Instruction;Gait training;Stair training;Functional mobility training;Therapeutic activities;Therapeutic exercise;Balance training;Neuromuscular re-education;Patient/family education;Manual techniques;Passive range of motion;Dry needling;Taping;Energy conservation    PT Next Visit Plan  f/u on controlled sitting and safety; strength and balance, core     PT Home Exercise Plan  Eval: LAQs with yellow TB, seated clams with yellow TB, standing heel raises; hooklying marching, clams    Consulted and Agree with Plan of Care  Patient       Patient will benefit from skilled therapeutic intervention in order to improve the following deficits and impairments:  Abnormal gait, Improper body mechanics, Pain, Postural dysfunction, Decreased activity tolerance, Decreased strength, Decreased balance, Decreased knowledge of use of DME, Hypomobility, Impaired perceived functional ability, Decreased knowledge of precautions, Decreased safety awareness, Difficulty walking, Impaired flexibility  Visit Diagnosis: Chronic bilateral low back pain with right-sided sciatica  Muscle weakness (generalized)  Unsteadiness on feet     Problem List Patient Active  Problem List   Diagnosis Date Noted  . HNP (herniated nucleus pulposus), lumbar 03/01/2018  . History of total knee replacement, right 11/13/2017  . OA (osteoarthritis) of knee 10/24/2017  . Chronic pain of right knee 06/17/2017  . Osteoporosis 07/30/2016  . Iron deficiency anemia due to chronic blood loss 01/07/2016  . Malabsorption of iron 01/07/2016  . Postoperative state 01/17/2015  . HOCM (hypertrophic obstructive cardiomyopathy) ([place]) 01/09/2014  . Breast cancer ([place]) 01/09/2014  . Coronary atherosclerosis of native coronary artery 12/20/2013  . CAD (coronary artery disease)   . Hyperlipidemia   .  Unstable angina ([place]) 12/19/2013  . Breast cancer of upper-outer quadrant of left female breast ([place]) 11/05/2013  . DCIS (ductal carcinoma in situ) of breast 10/26/2013  . Hypertension 04/05/2012  . Cardiac murmur 04/05/2012  . UNSPECIFIED HEARING LOSS 08/07/2010  . LOW BACK PAIN 04/22/2009  . CONSTIPATION 11/04/2008  . COLONIC POLYPS, HX OF 11/01/2008  . ALLERGIC RHINITIS 06/27/2007  . Hypothyroidism 06/22/2007   Maia Handa C. Raiven Belizaire PT, DPT 05/02/18 3:25 PM   [place] [facility] [address] [place], [place], [address] Phone: [phone]   Fax:  [phone]  Name: Kim [name] MRN: [id number] Date of Birth: 30-Mar-1935

## 2018-05-04 ENCOUNTER — Encounter: Payer: Self-pay | Admitting: Physical Therapy

## 2018-05-04 ENCOUNTER — Ambulatory Visit: Payer: Medicare Other | Attending: Neurosurgery | Admitting: Physical Therapy

## 2018-05-04 DIAGNOSIS — M5441 Lumbago with sciatica, right side: Secondary | ICD-10-CM | POA: Insufficient documentation

## 2018-05-04 DIAGNOSIS — M6281 Muscle weakness (generalized): Secondary | ICD-10-CM | POA: Diagnosis present

## 2018-05-04 DIAGNOSIS — R2681 Unsteadiness on feet: Secondary | ICD-10-CM | POA: Diagnosis present

## 2018-05-04 DIAGNOSIS — R2689 Other abnormalities of gait and mobility: Secondary | ICD-10-CM | POA: Diagnosis present

## 2018-05-04 DIAGNOSIS — R600 Localized edema: Secondary | ICD-10-CM | POA: Diagnosis present

## 2018-05-04 DIAGNOSIS — M25561 Pain in right knee: Secondary | ICD-10-CM | POA: Diagnosis present

## 2018-05-04 DIAGNOSIS — R262 Difficulty in walking, not elsewhere classified: Secondary | ICD-10-CM

## 2018-05-04 DIAGNOSIS — G8929 Other chronic pain: Secondary | ICD-10-CM | POA: Diagnosis present

## 2018-05-04 NOTE — Therapy (Signed)
[place], [place], [address] Phone: [phone]   Fax:  [phone]  Physical Therapy Treatment  Patient Details  Name: Kim [name] MRN: [id number] Date of Birth: September 29, 1935 Referring Provider: [name]    Encounter Date: 05/04/2018  PT End of Session - 05/04/18 1527    Visit Number  4    Number of Visits  17    Date for PT Re-Evaluation  05/15/18    Authorization Type  [employer] Medicare (PN visit 10, KX visit 15)    Authorization Time Period  04/17/18 to 06/18/18    Authorization - Visit Number  4    Authorization - Number of Visits  10    PT Start Time  3903    PT Stop Time  1526    PT Time Calculation (min)  39 min    Activity Tolerance  Patient tolerated treatment well    Behavior During Therapy  [facility] for tasks assessed/performed       Past Medical History:  Diagnosis Date  . Allergic rhinitis   . Allergy   . Arthritis    hands  . Breast cancer of upper-outer quadrant of left female breast ([place]) 11/08/13   finished chemo and radiation 2015  . CAD (coronary artery disease)    a. 12/2013 Cath/PCI: LM nl, LAD 20p, 53m, 30d, D1 30p, LCX 20p, 30m, OM1 20, Mo2 40p, RCA 30p, 71m (4.0x16 Promus Premier DES), 20d, RPL 30, RPDA 70p, 21m, EF 65-70%.  . Diverticulosis   . HOH (hard of hearing)    bilateral, wears hearing aids  . Hx of adenomatous colonic polyps 02/1999  . Hyperlipidemia   . Hypertension   . Hypertrophic obstructive cardiomyopathy (HOCM) [facility])    Cardiologist is Dr. [name]  . Hypothyroidism   . Iron deficiency anemia due to chronic blood loss 01/07/2016  . Low back pain    gets ESI from Dr. Rennis Harding  . Malabsorption of iron 01/07/2016  . Myocardial infarction ([place]) 12/2013   very mild-with first chemo -placed stent x1  . Personal history of radiation therapy 2015  . S/P radiation therapy 05/14/2014-06/26/2014   1) Left Breast  / 50 Gy in 25 fractions, 2) Left Supraclavicular  fossa/ 47.5 Gy in 25 fractions, 3) Left Posterior Axillary boost / 4.825 Gy in 25 fractions, 4) Left Breast boost / 10 Gy in 5 fractions   . SVD (spontaneous vaginal delivery)    x 2  . Tubulovillous adenoma of colon 12/2009   with HGD  . Urinary tract infection   . Wears hearing aid    left and right     Past Surgical History:  Procedure Laterality Date  . ABDOMINAL HYSTERECTOMY    . ANTERIOR AND POSTERIOR REPAIR N/A 01/17/2015   Procedure: CYSTOCELE REPAIR ;  Surgeon: Princess Bruins, MD;  Location: [place] [facility];  Service: Gynecology;  Laterality: N/A;  . BILATERAL SALPINGOOPHORECTOMY     see Laurin Coder NP for GYN exams  . BREAST BIOPSY Left 10/18/2013  . BREAST BIOPSY Left 10/31/2013  . BREAST LUMPECTOMY Left 11/05/2013  . BREAST LUMPECTOMY WITH NEEDLE LOCALIZATION AND AXILLARY LYMPH NODE DISSECTION Left 11/05/2013   Procedure: LEFT BREAST LUMPECTOMY WITH NEEDLE LOCALIZATION and axillary lymph Node Dissection;  Surgeon: Edward Jolly, MD;  Location: [place];  Service: General;  Laterality: Left;  . BREAST SURGERY  11/2013   left lumpectomy  . CATARACT EXTRACTION W/ INTRAOCULAR LENS  IMPLANT, BILATERAL  2012   bilateral  . COLONOSCOPY  11/04/2015   per Dr. Fuller Plan, adenomatous polyps, no repeats planned   . EYE SURGERY  11/22/2006   cataracts, bilateral, intraocular lens implant  . JOINT REPLACEMENT     2019  . LEFT HEART CATHETERIZATION WITH CORONARY ANGIOGRAM N/A 12/19/2013   Procedure: LEFT HEART CATHETERIZATION WITH CORONARY ANGIOGRAM;  Surgeon: Burnell Blanks, MD;  Location[facility] CATH LAB;  Service: Cardiovascular;  Laterality: N/A;  . lumbar epidural steroid injection     lumber spinal stenosis  . LUMBAR LAMINECTOMY/DECOMPRESSION MICRODISCECTOMY Right 03/01/2018   Procedure: RIGHT LUMBAR FOUR- LUMBAR FIVE LAMINECTOMY/MICRODISCECTOMY;  Surgeon: [name], MD;  Location: [place];  Service: Neurosurgery;  Laterality: Right;  . POLYPECTOMY    . PORTACATH  PLACEMENT Right 12/11/2013   Procedure: INSERTION PORT-A-CATH;  Surgeon: Edward Jolly, MD;  Location: [place];  Service: General;  Laterality: Right;  Subclavian Vein;   . TOTAL KNEE ARTHROPLASTY Right 10/24/2017   Procedure: TOTAL RIGHT KNEE ARTHROPLASTY;  Surgeon: Gaynelle Arabian, MD;  [facility];  Service: Orthopedics;  Laterality: Right;    There were no vitals filed for this visit.  Subjective Assessment - 05/04/18 1452    Subjective  I have done my exercises but my lower back is still hurting (pt is not wearing support brace today). I am afraid to get rid of the walker because I am so weak.     Patient Stated Goals  walk without walker, get out of pain     Currently in Pain?  Yes                       [facility] Adult PT Treatment/Exercise - 05/04/18 0001      Knee/Hip Exercises: Stretches   Passive Hamstring Stretch  Both;3 reps;20 seconds seated EOB      Knee/Hip Exercises: Aerobic   Nustep  5 min L4 UE & LE      Knee/Hip Exercises: Standing   Knee Flexion  Both;20 reps standing in walker    Forward Step Up  Step Height: 2" step taps    Gait Training  gait in clinic, PT CGA    Other Standing Knee Exercises  static balance on airex cues to stay center, tends to shift to Left leg      Knee/Hip Exercises: Seated   Long Arc Quad  Both;15 reps with ball squeeze bw knees    Other Seated Knee/Hip Exercises  overhead reach 2# for core activation             PT Education - 05/04/18 2010    Education Details  monitored rest breaks, upright posture, use of RW, importance of foot clearance    Person(s) Educated  Patient    Methods  Explanation    Comprehension  Verbalized understanding;Need further instruction       PT Short Term Goals - 04/17/18 1451      PT SHORT TERM GOAL #1   Title  Patient to be independent with appropriate HEP, to be updated PRN     Time  4    Period  Weeks    Status  New    Target Date  05/15/18       PT SHORT TERM GOAL #2   Title  Patient to demonstrate improved functional strength as evidenced by ability to peform sit to stand without use of UEs    Time  4    Period  Weeks  Status  New      PT SHORT TERM GOAL #3   Title  Patient to score at least 40 on Berg balance test to demonstrate reduced risk of falling and improved functional balance skills     Time  4    Period  Weeks    Status  New      PT SHORT TERM GOAL #4   Title  Patient to be able to ambulate house hold distances with [facility] and minimal unsteadiness in order to show improved functional mobility and balance     Time  4    Status  New        PT Long Term Goals - 04/17/18 1453      PT LONG TERM GOAL #1   Title  Patient to demonstrate MMT as being at least 4/5 in all tested muscle groups in order to show improved functional strength and stability     Time  8    Period  Weeks    Status  New    Target Date  06/12/18      PT LONG TERM GOAL #2   Title  Patient to score at least 45/56 on the Berg in order to show improved stability and reduced fall risk     Time  8    Period  Weeks    Status  New      PT LONG TERM GOAL #3   Title  Patient to be able to walk household distances without assistive device and will be able to safely ambulate with SPC in the community with mnimal unsteadiness and good safety awareness in order to show improved mobility and safety    Time  8    Period  Weeks    Status  New      PT LONG TERM GOAL #4   Title  Patient to report pain as being no more than 4/10 at worst in order to improve QOL and functional task tolerance     Time  8    Period  Weeks    Status  New            Plan - 05/04/18 2005    Clinical Impression Statement  CGA at all times during gait training today but pt did not require assistance for balance correction, rest breaks required between each length of approx 50 ft. Notable decrease in step length and foot clearance with Lt LE. Discussed importance of upright  posture and being aware to avoid forward bend while using RW. Able to demo controlled sit<>stand on treatment mat without use of UE today.     PT Treatment/Interventions  ADLs/Self Care Home Management;Biofeedback;Cryotherapy;Electrical Stimulation;Iontophoresis 4mg /ml Dexamethasone;Moist Heat;Traction;Ultrasound;DME Instruction;Gait training;Stair training;Functional mobility training;Therapeutic activities;Therapeutic exercise;Balance training;Neuromuscular re-education;Patient/family education;Manual techniques;Passive range of motion;Dry needling;Taping;Energy conservation    PT Next Visit Plan  core strengthening, dynamic balance, gait    PT Home Exercise Plan  Eval: LAQs with yellow TB, seated clams with yellow TB, standing heel raises; hooklying marching, clams, hamstring curls    Consulted and Agree with Plan of Care  Patient       Patient will benefit from skilled therapeutic intervention in order to improve the following deficits and impairments:  Abnormal gait, Improper body mechanics, Pain, Postural dysfunction, Decreased activity tolerance, Decreased strength, Decreased balance, Decreased knowledge of use of DME, Hypomobility, Impaired perceived functional ability, Decreased knowledge of precautions, Decreased safety awareness, Difficulty walking, Impaired flexibility  Visit Diagnosis: Chronic bilateral low back pain with  right-sided sciatica  Muscle weakness (generalized)  Unsteadiness on feet  Difficulty in walking, not elsewhere classified     Problem List Patient Active Problem List   Diagnosis Date Noted  . HNP (herniated nucleus pulposus), lumbar 03/01/2018  . History of total knee replacement, right 11/13/2017  . OA (osteoarthritis) of knee 10/24/2017  . Chronic pain of right knee 06/17/2017  . Osteoporosis 07/30/2016  . Iron deficiency anemia due to chronic blood loss 01/07/2016  . Malabsorption of iron 01/07/2016  . Postoperative state 01/17/2015  . HOCM  (hypertrophic obstructive cardiomyopathy) ([place]) 01/09/2014  . Breast cancer ([place]) 01/09/2014  . Coronary atherosclerosis of native coronary artery 12/20/2013  . CAD (coronary artery disease)   . Hyperlipidemia   . Unstable angina ([place]) 12/19/2013  . Breast cancer of upper-outer quadrant of left female breast ([place]) 11/05/2013  . DCIS (ductal carcinoma in situ) of breast 10/26/2013  . Hypertension 04/05/2012  . Cardiac murmur 04/05/2012  . UNSPECIFIED HEARING LOSS 08/07/2010  . LOW BACK PAIN 04/22/2009  . CONSTIPATION 11/04/2008  . COLONIC POLYPS, HX OF 11/01/2008  . ALLERGIC RHINITIS 06/27/2007  . Hypothyroidism 06/22/2007    Simmie Garin C. Claudine Stallings PT, DPT 05/04/18 8:11 PM   [place], [place], [address] Phone: [phone]   Fax:  [phone]  Name: SUMIRE HALBLEIB MRN: [id number] Date of Birth: [birth year]

## 2018-05-08 ENCOUNTER — Encounter: Payer: Self-pay | Admitting: Physical Therapy

## 2018-05-08 ENCOUNTER — Ambulatory Visit: Payer: Medicare Other | Admitting: Physical Therapy

## 2018-05-08 DIAGNOSIS — G8929 Other chronic pain: Secondary | ICD-10-CM

## 2018-05-08 DIAGNOSIS — R262 Difficulty in walking, not elsewhere classified: Secondary | ICD-10-CM

## 2018-05-08 DIAGNOSIS — M5441 Lumbago with sciatica, right side: Principal | ICD-10-CM

## 2018-05-08 DIAGNOSIS — M6281 Muscle weakness (generalized): Secondary | ICD-10-CM

## 2018-05-08 DIAGNOSIS — R2681 Unsteadiness on feet: Secondary | ICD-10-CM

## 2018-05-08 NOTE — Patient Instructions (Signed)

## 2018-05-08 NOTE — Therapy (Signed)
[place], [place], [address] Phone: [phone]   Fax:  [phone]  Physical Therapy Treatment  Patient Details  Name: Kim [name] MRN: [id number] Date of Birth: [birth year] Referring Provider: [name]    Encounter Date: 05/08/2018  PT End of Session - 05/08/18 1321    Visit Number  5    Number of Visits  17    Date for PT Re-Evaluation  05/15/18    Authorization - Visit Number  5    Authorization - Number of Visits  10    PT Start Time  0933    PT Stop Time  1015    PT Time Calculation (min)  42 min    Activity Tolerance  Patient tolerated treatment well    Behavior During Therapy  [facility] for tasks assessed/performed       Past Medical History:  Diagnosis Date  . Allergic rhinitis   . Allergy   . Arthritis    hands  . Breast cancer of upper-outer quadrant of left female breast ([place]) 11/08/13   finished chemo and radiation 2015  . CAD (coronary artery disease)    a. 12/2013 Cath/PCI: LM nl, LAD 20p, 51m, 30d, D1 30p, LCX 20p, 64m, OM1 20, Mo2 40p, RCA 30p, 64m (4.0x16 Promus Premier DES), 20d, RPL 30, RPDA 70p, 16m, EF 65-70%.  . Diverticulosis   . HOH (hard of hearing)    bilateral, wears hearing aids  . Hx of adenomatous colonic polyps 02/1999  . Hyperlipidemia   . Hypertension   . Hypertrophic obstructive cardiomyopathy (HOCM) [facility])    Cardiologist is Dr. [name]  . Hypothyroidism   . Iron deficiency anemia due to chronic blood loss 01/07/2016  . Low back pain    gets ESI from Dr. Rennis Harding  . Malabsorption of iron 01/07/2016  . Myocardial infarction ([place]) 12/2013   very mild-with first chemo -placed stent x1  . Personal history of radiation therapy 2015  . S/P radiation therapy 05/14/2014-06/26/2014   1) Left Breast  / 50 Gy in 25 fractions, 2) Left Supraclavicular fossa/ 47.5 Gy in 25 fractions, 3) Left Posterior Axillary boost / 4.825 Gy in 25 fractions, 4) Left Breast boost / 10  Gy in 5 fractions   . SVD (spontaneous vaginal delivery)    x 2  . Tubulovillous adenoma of colon 12/2009   with HGD  . Urinary tract infection   . Wears hearing aid    left and right     Past Surgical History:  Procedure Laterality Date  . ABDOMINAL HYSTERECTOMY    . ANTERIOR AND POSTERIOR REPAIR N/A 01/17/2015   Procedure: CYSTOCELE REPAIR ;  Surgeon: Princess Bruins, MD;  Location: [place] [facility];  Service: Gynecology;  Laterality: N/A;  . BILATERAL SALPINGOOPHORECTOMY     see Laurin Coder NP for GYN exams  . BREAST BIOPSY Left 10/18/2013  . BREAST BIOPSY Left 10/31/2013  . BREAST LUMPECTOMY Left 11/05/2013  . BREAST LUMPECTOMY WITH NEEDLE LOCALIZATION AND AXILLARY LYMPH NODE DISSECTION Left 11/05/2013   Procedure: LEFT BREAST LUMPECTOMY WITH NEEDLE LOCALIZATION and axillary lymph Node Dissection;  Surgeon: Edward Jolly, MD;  Location: [place];  Service: General;  Laterality: Left;  . BREAST SURGERY  11/2013   left lumpectomy  . CATARACT EXTRACTION W/ INTRAOCULAR LENS  IMPLANT, BILATERAL  2012   bilateral  . COLONOSCOPY  11/04/2015   per Dr. Fuller Plan, adenomatous polyps, no repeats planned   . EYE  SURGERY  11/22/2006   cataracts, bilateral, intraocular lens implant  . JOINT REPLACEMENT     2019  . LEFT HEART CATHETERIZATION WITH CORONARY ANGIOGRAM N/A 12/19/2013   Procedure: LEFT HEART CATHETERIZATION WITH CORONARY ANGIOGRAM;  Surgeon: Burnell Blanks, MD;  Location: [facility] CATH LAB;  Service: Cardiovascular;  Laterality: N/A;  . lumbar epidural steroid injection     lumber spinal stenosis  . LUMBAR LAMINECTOMY/DECOMPRESSION MICRODISCECTOMY Right 03/01/2018   Procedure: RIGHT LUMBAR FOUR- LUMBAR FIVE LAMINECTOMY/MICRODISCECTOMY;  Surgeon: [name], MD;  Location: [place];  Service: Neurosurgery;  Laterality: Right;  . POLYPECTOMY    . PORTACATH PLACEMENT Right 12/11/2013   Procedure: INSERTION PORT-A-CATH;  Surgeon: Edward Jolly, MD;  Location: [place];  Service: General;  Laterality: Right;  Subclavian Vein;   . TOTAL KNEE ARTHROPLASTY Right 10/24/2017   Procedure: TOTAL RIGHT KNEE ARTHROPLASTY;  Surgeon: Gaynelle Arabian, MD;  [facility];  Service: Orthopedics;  Laterality: Right;    There were no vitals filed for this visit.  Subjective Assessment - 05/08/18 0937    Subjective  No pain right now.  Some days I FEEL bLAH.  I THINk IT IS FROM the pain meds.  I have not had leg pain since last Aug 29    Currently in Pain?  No/denies    Pain Location  Back    Pain Orientation  Right;Lower    Pain Descriptors / Indicators  -- uncomfortable  low back only    Pain Radiating Towards  no    Pain Frequency  Intermittent    Aggravating Factors   long standing    Pain Relieving Factors    God touched me    Multiple Pain Sites  No                       [facility] Adult PT Treatment/Exercise - 05/08/18 0001      Self-Care   Self-Care  ADL's;Lifting    ADL's  ADLhandout    Lifting  Patient was able to pick a ball off floor with correct technique.       Knee/Hip Exercises: Stretches   Passive Hamstring Stretch  Both;3 reps;20 seconds seated EOB,  cued gentle strady stretch.    Gastroc Stretch  3 reps;30 seconds toes only incline board.  CGA      Knee/Hip Exercises: Standing   Hip Flexion  1 set;10 reps for balance no hands colse SBA,  Cue as needed.     Hip Abduction  Both;10 reps;1 set cues to not hold doorway if able    Other Standing Knee Exercises  static /  dynamic standing balance work CGA and cues.  challanging      Knee/Hip Exercises: Seated   Long Arc Quad  10 reps;2 sets with ball squeeze    Long Arc Quad Limitations  challanging  RT>LT    Sit to [employer]  10 reps cued ,  monitoring             PT Education - 05/08/18 1012    Education Details  ADL,  Lifting,  Exercise form    Person(s) Educated  Patient    Methods  Explanation;Demonstration;Tactile cues;Verbal cues;Handout     Comprehension  Verbalized understanding;Returned demonstration;Need further instruction       PT Short Term Goals - 04/17/18 1451      PT SHORT TERM GOAL #1   Title  Patient to be independent with appropriate HEP, to be  updated PRN     Time  4    Period  Weeks    Status  New    Target Date  05/15/18      PT SHORT TERM GOAL #2   Title  Patient to demonstrate improved functional strength as evidenced by ability to peform sit to stand without use of UEs    Time  4    Period  Weeks    Status  New      PT SHORT TERM GOAL #3   Title  Patient to score at least 40 on Berg balance test to demonstrate reduced risk of falling and improved functional balance skills     Time  4    Period  Weeks    Status  New      PT SHORT TERM GOAL #4   Title  Patient to be able to ambulate house hold distances with [facility] and minimal unsteadiness in order to show improved functional mobility and balance     Time  4    Status  New        PT Long Term Goals - 04/17/18 1453      PT LONG TERM GOAL #1   Title  Patient to demonstrate MMT as being at least 4/5 in all tested muscle groups in order to show improved functional strength and stability     Time  8    Period  Weeks    Status  New    Target Date  06/12/18      PT LONG TERM GOAL #2   Title  Patient to score at least 45/56 on the Berg in order to show improved stability and reduced fall risk     Time  8    Period  Weeks    Status  New      PT LONG TERM GOAL #3   Title  Patient to be able to walk household distances without assistive device and will be able to safely ambulate with SPC in the community with mnimal unsteadiness and good safety awareness in order to show improved mobility and safety    Time  8    Period  Weeks    Status  New      PT LONG TERM GOAL #4   Title  Patient to report pain as being no more than 4/10 at worst in order to improve QOL and functional task tolerance     Time  [place] - 05/08/18 1324    Clinical Impression Statement  Patient requires extra time for instruction due to hearing?  CGA continues to be needed for standing tasks, however she was able to pick a ball off floor correctly in a safe technique.  mild soreness noted with some exercises,  she declined the need for modalities for pain.     PT Next Visit Plan  core strengthening, dynamic balance, gait    PT Home Exercise Plan  Eval: LAQs with yellow TB, seated clams with yellow TB, standing heel raises; hooklying marching, clams, hamstring curls    Consulted and Agree with Plan of Care  Patient       Patient will benefit from skilled therapeutic intervention in order to improve the following deficits and impairments:     Visit Diagnosis: Chronic bilateral low back pain with right-sided sciatica  Muscle weakness (generalized)  Unsteadiness on feet  Difficulty in walking, not elsewhere classified     Problem List Patient Active Problem List   Diagnosis Date Noted  . HNP (herniated nucleus pulposus), lumbar 03/01/2018  . History of total knee replacement, right 11/13/2017  . OA (osteoarthritis) of knee 10/24/2017  . Chronic pain of right knee 06/17/2017  . Osteoporosis 07/30/2016  . Iron deficiency anemia due to chronic blood loss 01/07/2016  . Malabsorption of iron 01/07/2016  . Postoperative state 01/17/2015  . HOCM (hypertrophic obstructive cardiomyopathy) ([place]) 01/09/2014  . Breast cancer ([place]) 01/09/2014  . Coronary atherosclerosis of native coronary artery 12/20/2013  . CAD (coronary artery disease)   . Hyperlipidemia   . Unstable angina ([place]) 12/19/2013  . Breast cancer of upper-outer quadrant of left female breast ([place]) 11/05/2013  . DCIS (ductal carcinoma in situ) of breast 10/26/2013  . Hypertension 04/05/2012  . Cardiac murmur 04/05/2012  . UNSPECIFIED HEARING LOSS 08/07/2010  . LOW BACK PAIN 04/22/2009  . CONSTIPATION 11/04/2008  . COLONIC POLYPS, HX OF  11/01/2008  . ALLERGIC RHINITIS 06/27/2007  . Hypothyroidism 06/22/2007    HARRIS,KAREN PTA 05/08/2018, 1:26 PM  [facility] [address] [place], [place], [address] Phone: [phone]   Fax:  [phone]  Name: Kim [name] MRN: [id number] Date of Birth: 1935/08/14

## 2018-05-10 ENCOUNTER — Ambulatory Visit: Payer: Medicare Other | Admitting: Physical Therapy

## 2018-05-10 ENCOUNTER — Encounter: Payer: Self-pay | Admitting: Physical Therapy

## 2018-05-10 DIAGNOSIS — R2681 Unsteadiness on feet: Secondary | ICD-10-CM

## 2018-05-10 DIAGNOSIS — R262 Difficulty in walking, not elsewhere classified: Secondary | ICD-10-CM

## 2018-05-10 DIAGNOSIS — M5441 Lumbago with sciatica, right side: Secondary | ICD-10-CM | POA: Diagnosis not present

## 2018-05-10 DIAGNOSIS — M6281 Muscle weakness (generalized): Secondary | ICD-10-CM

## 2018-05-10 DIAGNOSIS — G8929 Other chronic pain: Secondary | ICD-10-CM

## 2018-05-10 NOTE — Therapy (Signed)
[place], [place], [address] Phone: [phone]   Fax:  [phone]  Physical Therapy Treatment  Patient Details  Name: Kim [name] MRN: [id number] Date of Birth: 07/21/1935 Referring Provider: [name]    Encounter Date: 05/10/2018  PT End of Session - 05/10/18 1721    Visit Number  6    Number of Visits  17    Date for PT Re-Evaluation  05/15/18    Authorization - Visit Number  6    Authorization - Number of Visits  10    PT Start Time  0934    PT Stop Time  1017    PT Time Calculation (min)  43 min    Activity Tolerance  Patient tolerated treatment well    Behavior During Therapy  [facility] for tasks assessed/performed       Past Medical History:  Diagnosis Date  . Allergic rhinitis   . Allergy   . Arthritis    hands  . Breast cancer of upper-outer quadrant of left female breast ([place]) 11/08/13   finished chemo and radiation 2015  . CAD (coronary artery disease)    a. 12/2013 Cath/PCI: LM nl, LAD 20p, 84m, 30d, D1 30p, LCX 20p, 4m, OM1 20, Mo2 40p, RCA 30p, 31m (4.0x16 Promus Premier DES), 20d, RPL 30, RPDA 70p, 84m, EF 65-70%.  . Diverticulosis   . HOH (hard of hearing)    bilateral, wears hearing aids  . Hx of adenomatous colonic polyps 02/1999  . Hyperlipidemia   . Hypertension   . Hypertrophic obstructive cardiomyopathy (HOCM) [facility])    Cardiologist is Dr. [name]  . Hypothyroidism   . Iron deficiency anemia due to chronic blood loss 01/07/2016  . Low back pain    gets ESI from Dr. Rennis Harding  . Malabsorption of iron 01/07/2016  . Myocardial infarction ([place]) 12/2013   very mild-with first chemo -placed stent x1  . Personal history of radiation therapy 2015  . S/P radiation therapy 05/14/2014-06/26/2014   1) Left Breast  / 50 Gy in 25 fractions, 2) Left Supraclavicular fossa/ 47.5 Gy in 25 fractions, 3) Left Posterior Axillary boost / 4.825 Gy in 25 fractions, 4) Left Breast boost / 10  Gy in 5 fractions   . SVD (spontaneous vaginal delivery)    x 2  . Tubulovillous adenoma of colon 12/2009   with HGD  . Urinary tract infection   . Wears hearing aid    left and right     Past Surgical History:  Procedure Laterality Date  . ABDOMINAL HYSTERECTOMY    . ANTERIOR AND POSTERIOR REPAIR N/A 01/17/2015   Procedure: CYSTOCELE REPAIR ;  Surgeon: Princess Bruins, MD;  Location: [place] [facility];  Service: Gynecology;  Laterality: N/A;  . BILATERAL SALPINGOOPHORECTOMY     see Laurin Coder NP for GYN exams  . BREAST BIOPSY Left 10/18/2013  . BREAST BIOPSY Left 10/31/2013  . BREAST LUMPECTOMY Left 11/05/2013  . BREAST LUMPECTOMY WITH NEEDLE LOCALIZATION AND AXILLARY LYMPH NODE DISSECTION Left 11/05/2013   Procedure: LEFT BREAST LUMPECTOMY WITH NEEDLE LOCALIZATION and axillary lymph Node Dissection;  Surgeon: Edward Jolly, MD;  Location: [place];  Service: General;  Laterality: Left;  . BREAST SURGERY  11/2013   left lumpectomy  . CATARACT EXTRACTION W/ INTRAOCULAR LENS  IMPLANT, BILATERAL  2012   bilateral  . COLONOSCOPY  11/04/2015   per Dr. Fuller Plan, adenomatous polyps, no repeats planned   . EYE  SURGERY  11/22/2006   cataracts, bilateral, intraocular lens implant  . JOINT REPLACEMENT     2019  . LEFT HEART CATHETERIZATION WITH CORONARY ANGIOGRAM N/A 12/19/2013   Procedure: LEFT HEART CATHETERIZATION WITH CORONARY ANGIOGRAM;  Surgeon: Burnell Blanks, MD;  Location: [facility] CATH LAB;  Service: Cardiovascular;  Laterality: N/A;  . lumbar epidural steroid injection     lumber spinal stenosis  . LUMBAR LAMINECTOMY/DECOMPRESSION MICRODISCECTOMY Right 03/01/2018   Procedure: RIGHT LUMBAR FOUR- LUMBAR FIVE LAMINECTOMY/MICRODISCECTOMY;  Surgeon: [name], MD;  Location: [place];  Service: Neurosurgery;  Laterality: Right;  . POLYPECTOMY    . PORTACATH PLACEMENT Right 12/11/2013   Procedure: INSERTION PORT-A-CATH;  Surgeon: Edward Jolly, MD;  Location: [place];  Service: General;  Laterality: Right;  Subclavian Vein;   . TOTAL KNEE ARTHROPLASTY Right 10/24/2017   Procedure: TOTAL RIGHT KNEE ARTHROPLASTY;  Surgeon: Gaynelle Arabian, MD;  Location: [facility];  Service: Orthopedics;  Laterality: Right;    There were no vitals filed for this visit.  Subjective Assessment - 05/10/18 0936    Subjective  patient has been doing the exercises. She  has not fallen    Currently in Pain?  Yes    Pain Score  -- mild  no # given    Pain Location  Back    Pain Orientation  Right;Lower    Pain Type  Chronic pain    Pain Frequency  Intermittent    Aggravating Factors   long standing                       [facility] Adult PT Treatment/Exercise - 05/10/18 0001      High Level Balance   High Level Balance Comments  narrow base ex in corner  compliant and non compliant challanging      Knee/Hip Exercises: Stretches   Active Hamstring Stretch  3 reps;20 seconds    Gastroc Stretch  3 reps;20 seconds incline  mod cues to avoid  bouncing.       Knee/Hip Exercises: Seated   Sit to Sand  10 reps higher mat,cued      Knee/Hip Exercises: Sidelying   Clams  10 x mod cues             PT Education - 05/10/18 1721    Education Details  Exercise form    Person(s) Educated  Patient    Methods  Explanation    Comprehension  Verbalized understanding       PT Short Term Goals - 04/17/18 1451      PT SHORT TERM GOAL #1   Title  Patient to be independent with appropriate HEP, to be updated PRN     Time  4    Period  Weeks    Status  New    Target Date  05/15/18      PT SHORT TERM GOAL #2   Title  Patient to demonstrate improved functional strength as evidenced by ability to peform sit to stand without use of UEs    Time  4    Period  Weeks    Status  New      PT SHORT TERM GOAL #3   Title  Patient to score at least 40 on Berg balance test to demonstrate reduced risk of falling and improved functional balance skills      Time  4    Period  Weeks    Status  New  PT SHORT TERM GOAL #4   Title  Patient to be able to ambulate house hold distances with [facility] and minimal unsteadiness in order to show improved functional mobility and balance     Time  4    Status  New        PT Long Term Goals - 04/17/18 1453      PT LONG TERM GOAL #1   Title  Patient to demonstrate MMT as being at least 4/5 in all tested muscle groups in order to show improved functional strength and stability     Time  8    Period  Weeks    Status  New    Target Date  06/12/18      PT LONG TERM GOAL #2   Title  Patient to score at least 45/56 on the Berg in order to show improved stability and reduced fall risk     Time  8    Period  Weeks    Status  New      PT LONG TERM GOAL #3   Title  Patient to be able to walk household distances without assistive device and will be able to safely ambulate with SPC in the community with mnimal unsteadiness and good safety awareness in order to show improved mobility and safety    Time  8    Period  Weeks    Status  New      PT LONG TERM GOAL #4   Title  Patient to report pain as being no more than 4/10 at worst in order to improve QOL and functional task tolerance     Time  8    Period  Weeks    Status  New            Plan - 05/10/18 1722    Clinical Impression Statement  Focus today on strengthening and balance.  She was able to tolerate session without back pain,  ( some right knee pain)  She fatigues quickly.  balance exercises were a challange and she needed CGA for safety.     PT Next Visit Plan  core strengthening, dynamic balance, gait    PT Home Exercise Plan  Eval: LAQs with yellow TB, seated clams with yellow TB, standing heel raises; hooklying marching, clams, hamstring curls    Consulted and Agree with Plan of Care  Patient       Patient will benefit from skilled therapeutic intervention in order to improve the following deficits and impairments:     Visit  Diagnosis: Chronic bilateral low back pain with right-sided sciatica  Muscle weakness (generalized)  Unsteadiness on feet  Difficulty in walking, not elsewhere classified     Problem List Patient Active Problem List   Diagnosis Date Noted  . HNP (herniated nucleus pulposus), lumbar 03/01/2018  . History of total knee replacement, right 11/13/2017  . OA (osteoarthritis) of knee 10/24/2017  . Chronic pain of right knee 06/17/2017  . Osteoporosis 07/30/2016  . Iron deficiency anemia due to chronic blood loss 01/07/2016  . Malabsorption of iron 01/07/2016  . Postoperative state 01/17/2015  . HOCM (hypertrophic obstructive cardiomyopathy) ([place]) 01/09/2014  . Breast cancer ([place]) 01/09/2014  . Coronary atherosclerosis of native coronary artery 12/20/2013  . CAD (coronary artery disease)   . Hyperlipidemia   . Unstable angina ([place]) 12/19/2013  . Breast cancer of upper-outer quadrant of left female breast ([place]) 11/05/2013  . DCIS (ductal carcinoma in situ) of breast 10/26/2013  .  Hypertension 04/05/2012  . Cardiac murmur 04/05/2012  . UNSPECIFIED HEARING LOSS 08/07/2010  . LOW BACK PAIN 04/22/2009  . CONSTIPATION 11/04/2008  . COLONIC POLYPS, HX OF 11/01/2008  . ALLERGIC RHINITIS 06/27/2007  . Hypothyroidism 06/22/2007    Collyn Ribas PTA 05/10/2018, 5:25 PM  [facility] [address] [place], [place], [address] Phone: [phone]   Fax:  [phone]  Name: Kim [name] MRN: [id number] Date of Birth: 08-17-1935

## 2018-05-12 ENCOUNTER — Other Ambulatory Visit (HOSPITAL_COMMUNITY): Payer: Self-pay | Admitting: Cardiology

## 2018-05-15 ENCOUNTER — Ambulatory Visit: Payer: Medicare Other | Admitting: Physical Therapy

## 2018-05-15 ENCOUNTER — Encounter: Payer: Self-pay | Admitting: Physical Therapy

## 2018-05-15 DIAGNOSIS — M6281 Muscle weakness (generalized): Secondary | ICD-10-CM

## 2018-05-15 DIAGNOSIS — M5441 Lumbago with sciatica, right side: Principal | ICD-10-CM

## 2018-05-15 DIAGNOSIS — R2681 Unsteadiness on feet: Secondary | ICD-10-CM

## 2018-05-15 DIAGNOSIS — G8929 Other chronic pain: Secondary | ICD-10-CM

## 2018-05-15 DIAGNOSIS — R262 Difficulty in walking, not elsewhere classified: Secondary | ICD-10-CM

## 2018-05-15 NOTE — Therapy (Signed)
[place], [place], [address] Phone: [phone]   Fax:  [phone]  Physical Therapy Treatment/Progress note   Progress Note Reporting Period 04/17/2018 to 05/15/2018  See note below for Objective Data and Assessment of Progress/Goals.      Patient Details  Name: Kim [name] MRN: [id number] Date of Birth: 1935/01/16 Referring Provider: [name]   Encounter Date: 05/15/2018  PT End of Session - 05/15/18 0930    Visit Number  7    Number of Visits  17    Date for PT Re-Evaluation  06/16/18    Authorization Type  [employer] Medicare (PN visit 10, KX visit 15)    Authorization Time Period  04/17/18 to 06/18/18    PT Start Time  0930    PT Stop Time  1010    PT Time Calculation (min)  40 min    Activity Tolerance  Patient tolerated treatment well    Behavior During Therapy  [facility] for tasks assessed/performed       Past Medical History:  Diagnosis Date  . Allergic rhinitis   . Allergy   . Arthritis    hands  . Breast cancer of upper-outer quadrant of left female breast ([place]) 11/08/13   finished chemo and radiation 2015  . CAD (coronary artery disease)    a. 12/2013 Cath/PCI: LM nl, LAD 20p, 29m, 30d, D1 30p, LCX 20p, 61m, OM1 20, Mo2 40p, RCA 30p, 58m (4.0x16 Promus Premier DES), 20d, RPL 30, RPDA 70p, 44m, EF 65-70%.  . Diverticulosis   . HOH (hard of hearing)    bilateral, wears hearing aids  . Hx of adenomatous colonic polyps 02/1999  . Hyperlipidemia   . Hypertension   . Hypertrophic obstructive cardiomyopathy (HOCM) [facility])    Cardiologist is Dr. [name]  . Hypothyroidism   . Iron deficiency anemia due to chronic blood loss 01/07/2016  . Low back pain    gets ESI from Dr. Rennis Harding  . Malabsorption of iron 01/07/2016  . Myocardial infarction ([place]) 12/2013   very mild-with first chemo -placed stent x1  . Personal history of radiation therapy 2015  . S/P radiation therapy  05/14/2014-06/26/2014   1) Left Breast  / 50 Gy in 25 fractions, 2) Left Supraclavicular fossa/ 47.5 Gy in 25 fractions, 3) Left Posterior Axillary boost / 4.825 Gy in 25 fractions, 4) Left Breast boost / 10 Gy in 5 fractions   . SVD (spontaneous vaginal delivery)    x 2  . Tubulovillous adenoma of colon 12/2009   with HGD  . Urinary tract infection   . Wears hearing aid    left and right     Past Surgical History:  Procedure Laterality Date  . ABDOMINAL HYSTERECTOMY    . ANTERIOR AND POSTERIOR REPAIR N/A 01/17/2015   Procedure: CYSTOCELE REPAIR ;  Surgeon: Princess Bruins, MD;  Location: [place] [facility];  Service: Gynecology;  Laterality: N/A;  . BILATERAL SALPINGOOPHORECTOMY     see Laurin Coder NP for GYN exams  . BREAST BIOPSY Left 10/18/2013  . BREAST BIOPSY Left 10/31/2013  . BREAST LUMPECTOMY Left 11/05/2013  . BREAST LUMPECTOMY WITH NEEDLE LOCALIZATION AND AXILLARY LYMPH NODE DISSECTION Left 11/05/2013   Procedure: LEFT BREAST LUMPECTOMY WITH NEEDLE LOCALIZATION and axillary lymph Node Dissection;  Surgeon: Edward Jolly, MD;  Location: [place];  Service: General;  Laterality: Left;  . BREAST SURGERY  11/2013   left lumpectomy  . CATARACT EXTRACTION W/  INTRAOCULAR LENS  IMPLANT, BILATERAL  2012   bilateral  . COLONOSCOPY  11/04/2015   per Dr. Fuller Plan, adenomatous polyps, no repeats planned   . EYE SURGERY  11/22/2006   cataracts, bilateral, intraocular lens implant  . JOINT REPLACEMENT     2019  . LEFT HEART CATHETERIZATION WITH CORONARY ANGIOGRAM N/A 12/19/2013   Procedure: LEFT HEART CATHETERIZATION WITH CORONARY ANGIOGRAM;  Surgeon: Burnell Blanks, MD;  Location: [facility] CATH LAB;  Service: Cardiovascular;  Laterality: N/A;  . lumbar epidural steroid injection     lumber spinal stenosis  . LUMBAR LAMINECTOMY/DECOMPRESSION MICRODISCECTOMY Right 03/01/2018   Procedure: RIGHT LUMBAR FOUR- LUMBAR FIVE LAMINECTOMY/MICRODISCECTOMY;  Surgeon: [name], MD;   Location: [place];  Service: Neurosurgery;  Laterality: Right;  . POLYPECTOMY    . PORTACATH PLACEMENT Right 12/11/2013   Procedure: INSERTION PORT-A-CATH;  Surgeon: Edward Jolly, MD;  Location: [facility];  Service: General;  Laterality: Right;  Subclavian Vein;   . TOTAL KNEE ARTHROPLASTY Right 10/24/2017   Procedure: TOTAL RIGHT KNEE ARTHROPLASTY;  Surgeon: Gaynelle Arabian, MD;  [facility];  Service: Orthopedics;  Laterality: Right;    There were no vitals filed for this visit.  Subjective Assessment - 05/15/18 0930    Subjective  A little pain in low back across both sides, not enough to take a pain pill. Enjoying walking without AD. My legs, esp my Right, are weak after having all that pain.     Patient Stated Goals  walk without walker, get out of pain     Currently in Pain?  Yes    Pain Score  --   "just a little"   Pain Location  Back    Pain Orientation  Lower;Right;Left    Pain Descriptors / Indicators  Aching   uncomfortable   Aggravating Factors   I know it's there in the morning especially.          [facility] PT Assessment - 05/15/18 0001      Assessment   Medical Diagnosis  back pain     Referring Provider  [name]    Onset Date/Surgical Date  03/01/18      Precautions   Spinal Brace  Other (comment)   no longer wearing     Strength   Right Hip Flexion  4+/5    Right Hip ABduction  4/5    Left Hip Flexion  4/5    Left Hip ABduction  4/5    Right Knee Flexion  5/5    Right Knee Extension  4+/5    Left Knee Flexion  5/5    Left Knee Extension  4/5      Ambulation/Gait   Gait Comments  without AD, good heel toe pattern, short step length but does pass toe, slow cadence, just shy of upright posture      Berg Balance Test   Sit to Stand  Able to stand  independently using hands    Standing Unsupported  Able to stand safely 2 minutes    Sitting with Back Unsupported but Feet Supported on Floor or Stool  Able to sit safely and  securely 2 minutes    Stand to Sit  Sits safely with minimal use of hands    Transfers  Able to transfer safely, minor use of hands    Standing Unsupported with Eyes Closed  Able to stand 10 seconds safely    Standing Ubsupported with Feet Together  Able to place feet together  independently and stand 1 minute safely    From Standing, Reach Forward with Outstretched Arm  Can reach forward >12 cm safely (5")    From Standing Position, Pick up Object from [place] to pick up shoe safely and easily    From Standing Position, Turn to Look Behind Over each Shoulder  Looks behind one side only/other side shows less weight shift    Turn 360 Degrees  Able to turn 360 degrees safely but slowly    Standing Unsupported, Alternately Place Feet on Step/Stool  Able to complete >2 steps/needs minimal assist    Standing Unsupported, One Foot in Front  Able to take small step independently and hold 30 seconds    Standing on One Leg  Tries to lift leg/unable to hold 3 seconds but remains standing independently    Total Score  43                   [facility] Adult PT Treatment/Exercise - 05/15/18 0001      Knee/Hip Exercises: Aerobic   Nustep  5 min L5 UE & LE             PT Education - 05/15/18 1018    Education Details  goals discussion, HEP, POC    Person(s) Educated  Patient    Methods  Explanation;Demonstration    Comprehension  Verbalized understanding;Returned demonstration;Need further instruction       PT Short Term Goals - 05/15/18 0945      PT SHORT TERM GOAL #1   Title  Patient to be independent with appropriate HEP, to be updated PRN     Status  Achieved      PT SHORT TERM GOAL #2   Title  Patient to demonstrate improved functional strength as evidenced by ability to peform sit to stand without use of UEs    Status  Achieved      PT SHORT TERM GOAL #3   Title  Patient to score at least 40 on Berg balance test to demonstrate reduced risk of falling and improved  functional balance skills     Baseline  43    Status  Achieved      PT SHORT TERM GOAL #4   Title  Patient to be able to ambulate house hold distances with SPC and minimal unsteadiness in order to show improved functional mobility and balance     Status  Achieved        PT Long Term Goals - 05/15/18 0945      PT LONG TERM GOAL #1   Title  Patient to demonstrate MMT as being at least 4/5 in all tested muscle groups in order to show improved functional strength and stability     Baseline  see flowsheet    Status  On-going      PT LONG TERM GOAL #2   Title  Patient to score at least 45/56 on the Berg in order to show improved stability and reduced fall risk     Baseline  43/56    Status  On-going      PT LONG TERM GOAL #3   Title  Patient to be able to walk household distances without assistive device and will be able to safely ambulate with SPC in the community with mnimal unsteadiness and good safety awareness in order to show improved mobility and safety    Baseline  not using AD at this time but may begin carrying cane for long distances  for safety    Status  Achieved      PT LONG TERM GOAL #4   Title  Patient to report pain as being no more than 4/10 at worst in order to improve QOL and functional task tolerance     Baseline  just uncomfortable    Status  Achieved            Plan - 05/15/18 1010    Clinical Impression Statement  Pt has made significant improvement toward goals, she is now able to ambulate safely without AD on solid, level surfaces with fatigue notable. Sharp back pains have resolved but does notice a discomfort right when she gets out of bed and with fatigue. BERG balance improved from 31 to 43/56. Pt asked about getting a stationary bike at home which I advised would be a safe and effective long term exercise option. I recommend 4 more weeks of PT in order to continue challenging balance and endurance in standing to continue reducing fall risk as she is  now ambulating in the community without AD- Pt is hesistant and would like to discuss this with her MD.     PT Frequency  2x / week    PT Duration  4 weeks    PT Treatment/Interventions  ADLs/Self Care Home Management;Biofeedback;Cryotherapy;Electrical Stimulation;Iontophoresis 4mg /ml Dexamethasone;Moist Heat;Traction;Ultrasound;DME Instruction;Gait training;Stair training;Functional mobility training;Therapeutic activities;Therapeutic exercise;Balance training;Neuromuscular re-education;Patient/family education;Manual techniques;Passive range of motion;Dry needling;Taping;Energy conservation    PT Next Visit Plan  core strengthening, dynamic balance, gait    PT Home Exercise Plan  Eval: LAQs with yellow TB, seated clams with yellow TB, standing heel raises; hooklying marching, clams, hamstring curls    Consulted and Agree with Plan of Care  Patient       Patient will benefit from skilled therapeutic intervention in order to improve the following deficits and impairments:  Abnormal gait, Improper body mechanics, Pain, Postural dysfunction, Decreased activity tolerance, Decreased strength, Decreased balance, Decreased knowledge of use of DME, Hypomobility, Impaired perceived functional ability, Decreased knowledge of precautions, Decreased safety awareness, Difficulty walking, Impaired flexibility  Visit Diagnosis: Chronic bilateral low back pain with right-sided sciatica - Plan: PT plan of care cert/re-cert  Muscle weakness (generalized) - Plan: PT plan of care cert/re-cert  Unsteadiness on feet - Plan: PT plan of care cert/re-cert  Difficulty in walking, not elsewhere classified - Plan: PT plan of care cert/re-cert     Problem List Patient Active Problem List   Diagnosis Date Noted  . HNP (herniated nucleus pulposus), lumbar 03/01/2018  . History of total knee replacement, right 11/13/2017  . OA (osteoarthritis) of knee 10/24/2017  . Chronic pain of right knee 06/17/2017  .  Osteoporosis 07/30/2016  . Iron deficiency anemia due to chronic blood loss 01/07/2016  . Malabsorption of iron 01/07/2016  . Postoperative state 01/17/2015  . HOCM (hypertrophic obstructive cardiomyopathy) ([place]) 01/09/2014  . Breast cancer ([place]) 01/09/2014  . Coronary atherosclerosis of native coronary artery 12/20/2013  . CAD (coronary artery disease)   . Hyperlipidemia   . Unstable angina ([place]) 12/19/2013  . Breast cancer of upper-outer quadrant of left female breast ([place]) 11/05/2013  . DCIS (ductal carcinoma in situ) of breast 10/26/2013  . Hypertension 04/05/2012  . Cardiac murmur 04/05/2012  . UNSPECIFIED HEARING LOSS 08/07/2010  . LOW BACK PAIN 04/22/2009  . CONSTIPATION 11/04/2008  . COLONIC POLYPS, HX OF 11/01/2008  . ALLERGIC RHINITIS 06/27/2007  . Hypothyroidism 06/22/2007    Rayanne Padmanabhan C. Lilyanne Mcquown PT, DPT 05/15/18 10:20 AM  [place], [place], [address] Phone: [phone]   Fax:  [phone]  Name: SHONDREA STEINERT MRN: [id number] Date of Birth: 1935/03/17

## 2018-05-17 ENCOUNTER — Encounter: Payer: Self-pay | Admitting: Physical Therapy

## 2018-05-17 ENCOUNTER — Ambulatory Visit: Payer: Medicare Other | Admitting: Physical Therapy

## 2018-05-17 DIAGNOSIS — M5441 Lumbago with sciatica, right side: Principal | ICD-10-CM

## 2018-05-17 DIAGNOSIS — G8929 Other chronic pain: Secondary | ICD-10-CM

## 2018-05-17 DIAGNOSIS — R2681 Unsteadiness on feet: Secondary | ICD-10-CM

## 2018-05-17 DIAGNOSIS — R262 Difficulty in walking, not elsewhere classified: Secondary | ICD-10-CM

## 2018-05-17 DIAGNOSIS — M6281 Muscle weakness (generalized): Secondary | ICD-10-CM

## 2018-05-17 NOTE — Therapy (Signed)
[place], [place], [address] Phone: [phone]   Fax:  [phone]  Physical Therapy Treatment  Patient Details  Name: Kim [name] MRN: [id number] Date of Birth: Mar 03, 1935 Referring Provider: [name]   Encounter Date: 05/17/2018  PT End of Session - 05/17/18 0928    Visit Number  8    Number of Visits  17    Date for PT Re-Evaluation  06/16/18    Authorization Type  [employer] Medicare (PN visit 10, KX visit 15)    Authorization Time Period  04/17/18 to 06/18/18    PT Start Time  0929    PT Stop Time  1008    PT Time Calculation (min)  39 min    Activity Tolerance  Patient tolerated treatment well    Behavior During Therapy  [facility] for tasks assessed/performed       Past Medical History:  Diagnosis Date  . Allergic rhinitis   . Allergy   . Arthritis    hands  . Breast cancer of upper-outer quadrant of left female breast ([place]) 11/08/13   finished chemo and radiation 2015  . CAD (coronary artery disease)    a. 12/2013 Cath/PCI: LM nl, LAD 20p, 16m, 30d, D1 30p, LCX 20p, 51m, OM1 20, Mo2 40p, RCA 30p, 45m (4.0x16 Promus Premier DES), 20d, RPL 30, RPDA 70p, 25m, EF 65-70%.  . Diverticulosis   . HOH (hard of hearing)    bilateral, wears hearing aids  . Hx of adenomatous colonic polyps 02/1999  . Hyperlipidemia   . Hypertension   . Hypertrophic obstructive cardiomyopathy (HOCM) [facility])    Cardiologist is Dr. [name]  . Hypothyroidism   . Iron deficiency anemia due to chronic blood loss 01/07/2016  . Low back pain    gets ESI from Dr. Rennis Harding  . Malabsorption of iron 01/07/2016  . Myocardial infarction ([place]) 12/2013   very mild-with first chemo -placed stent x1  . Personal history of radiation therapy 2015  . S/P radiation therapy 05/14/2014-06/26/2014   1) Left Breast  / 50 Gy in 25 fractions, 2) Left Supraclavicular fossa/ 47.5 Gy in 25 fractions, 3) Left Posterior Axillary boost / 4.825 Gy in 25  fractions, 4) Left Breast boost / 10 Gy in 5 fractions   . SVD (spontaneous vaginal delivery)    x 2  . Tubulovillous adenoma of colon 12/2009   with HGD  . Urinary tract infection   . Wears hearing aid    left and right     Past Surgical History:  Procedure Laterality Date  . ABDOMINAL HYSTERECTOMY    . ANTERIOR AND POSTERIOR REPAIR N/A 01/17/2015   Procedure: CYSTOCELE REPAIR ;  Surgeon: Princess Bruins, MD;  Location: [facility];  Service: Gynecology;  Laterality: N/A;  . BILATERAL SALPINGOOPHORECTOMY     see Laurin Coder NP for GYN exams  . BREAST BIOPSY Left 10/18/2013  . BREAST BIOPSY Left 10/31/2013  . BREAST LUMPECTOMY Left 11/05/2013  . BREAST LUMPECTOMY WITH NEEDLE LOCALIZATION AND AXILLARY LYMPH NODE DISSECTION Left 11/05/2013   Procedure: LEFT BREAST LUMPECTOMY WITH NEEDLE LOCALIZATION and axillary lymph Node Dissection;  Surgeon: Edward Jolly, MD;  Location: [place];  Service: General;  Laterality: Left;  . BREAST SURGERY  11/2013   left lumpectomy  . CATARACT EXTRACTION W/ INTRAOCULAR LENS  IMPLANT, BILATERAL  2012   bilateral  . COLONOSCOPY  11/04/2015   per Dr. Fuller Plan, adenomatous polyps, no repeats planned   .  EYE SURGERY  11/22/2006   cataracts, bilateral, intraocular lens implant  . JOINT REPLACEMENT     2019  . LEFT HEART CATHETERIZATION WITH CORONARY ANGIOGRAM N/A 12/19/2013   Procedure: LEFT HEART CATHETERIZATION WITH CORONARY ANGIOGRAM;  Surgeon: Burnell Blanks, MD;  Location: [facility] CATH LAB;  Service: Cardiovascular;  Laterality: N/A;  . lumbar epidural steroid injection     lumber spinal stenosis  . LUMBAR LAMINECTOMY/DECOMPRESSION MICRODISCECTOMY Right 03/01/2018   Procedure: RIGHT LUMBAR FOUR- LUMBAR FIVE LAMINECTOMY/MICRODISCECTOMY;  Surgeon: [name], MD;  Location: [place] [facility];  Service: Neurosurgery;  Laterality: Right;  . POLYPECTOMY    . PORTACATH PLACEMENT Right 12/11/2013   Procedure: INSERTION PORT-A-CATH;  Surgeon: Edward Jolly, MD;  Location: [place];  Service: General;  Laterality: Right;  Subclavian Vein;   . TOTAL KNEE ARTHROPLASTY Right 10/24/2017   Procedure: TOTAL RIGHT KNEE ARTHROPLASTY;  Surgeon: Gaynelle Arabian, MD;  Location: [facility];  Service: Orthopedics;  Laterality: Right;    There were no vitals filed for this visit.  Subjective Assessment - 05/17/18 0929    Subjective  Md would not give me a cortisone injection. Yesterday my back was bothering me.     Patient Stated Goals  walk without walker, get out of pain     Currently in Pain?  Yes    Pain Score  --   just a little bit   Pain Location  Back    Pain Orientation  Lower;Right;Left    Pain Descriptors / Indicators  Aching                       [facility] Adult PT Treatment/Exercise - 05/17/18 0001      Exercises   Exercises  Knee/Hip      Knee/Hip Exercises: Stretches   Piriformis Stretch  Both;20 seconds    Other Knee/Hip Stretches  single knee to chest bilat 20 s      Knee/Hip Exercises: Aerobic   Nustep  5 min L5 UE & LE      Knee/Hip Exercises: Standing   Other Standing Knee Exercises  glut sets    Other Standing Knee Exercises  sit<>stand without UE      Manual Therapy   Manual Therapy  Soft tissue mobilization    Manual therapy comments  edu in self massage with tennis ball    Soft tissue mobilization  bil glut max & med/min, piriformis             PT Education - 05/17/18 1012    Education Details  monitored rest break due to dizziness after being supine, trigger point pain vs nerve pain    Person(s) Educated  Patient    Methods  Explanation;Demonstration;Tactile cues;Verbal cues;Handout    Comprehension  Verbalized understanding;Returned demonstration;Verbal cues required;Tactile cues required;Need further instruction       PT Short Term Goals - 05/15/18 0945      PT SHORT TERM GOAL #1   Title  Patient to be independent with appropriate HEP, to be updated PRN     Status   Achieved      PT SHORT TERM GOAL #2   Title  Patient to demonstrate improved functional strength as evidenced by ability to peform sit to stand without use of UEs    Status  Achieved      PT SHORT TERM GOAL #3   Title  Patient to score at least 40 on Berg balance test to demonstrate reduced risk  of falling and improved functional balance skills     Baseline  43    Status  Achieved      PT SHORT TERM GOAL #4   Title  Patient to be able to ambulate house hold distances with [facility] and minimal unsteadiness in order to show improved functional mobility and balance     Status  Achieved        PT Long Term Goals - 05/15/18 0945      PT LONG TERM GOAL #1   Title  Patient to demonstrate MMT as being at least 4/5 in all tested muscle groups in order to show improved functional strength and stability     Baseline  see flowsheet    Status  On-going      PT LONG TERM GOAL #2   Title  Patient to score at least 45/56 on the Berg in order to show improved stability and reduced fall risk     Baseline  43/56    Status  On-going      PT LONG TERM GOAL #3   Title  Patient to be able to walk household distances without assistive device and will be able to safely ambulate with SPC in the community with mnimal unsteadiness and good safety awareness in order to show improved mobility and safety    Baseline  not using AD at this time but may begin carrying cane for long distances for safety    Status  Achieved      PT LONG TERM GOAL #4   Title  Patient to report pain as being no more than 4/10 at worst in order to improve QOL and functional task tolerance     Baseline  just uncomfortable    Status  Achieved            Plan - 05/17/18 1010    Clinical Impression Statement  Pt reported concordant pain with palpation to trigger points in bilat hips. Decreased pain with standing following manual therapy. Was able to stand without UE use but required verbal cuing.     PT Treatment/Interventions   ADLs/Self Care Home Management;Biofeedback;Cryotherapy;Electrical Stimulation;Iontophoresis 4mg /ml Dexamethasone;Moist Heat;Traction;Ultrasound;DME Instruction;Gait training;Stair training;Functional mobility training;Therapeutic activities;Therapeutic exercise;Balance training;Neuromuscular re-education;Patient/family education;Manual techniques;Passive range of motion;Dry needling;Taping;Energy conservation    PT Next Visit Plan  core strengthening, dynamic balance, gait    PT Home Exercise Plan  Eval: LAQs with yellow TB, seated clams with yellow TB, standing heel raises; hooklying marching, clams, hamstring curls; SKTC, LTR, piriformis stretch    Consulted and Agree with Plan of Care  Patient       Patient will benefit from skilled therapeutic intervention in order to improve the following deficits and impairments:  Abnormal gait, Improper body mechanics, Pain, Postural dysfunction, Decreased activity tolerance, Decreased strength, Decreased balance, Decreased knowledge of use of DME, Hypomobility, Impaired perceived functional ability, Decreased knowledge of precautions, Decreased safety awareness, Difficulty walking, Impaired flexibility  Visit Diagnosis: Chronic bilateral low back pain with right-sided sciatica  Muscle weakness (generalized)  Unsteadiness on feet  Difficulty in walking, not elsewhere classified     Problem List Patient Active Problem List   Diagnosis Date Noted  . HNP (herniated nucleus pulposus), lumbar 03/01/2018  . History of total knee replacement, right 11/13/2017  . OA (osteoarthritis) of knee 10/24/2017  . Chronic pain of right knee 06/17/2017  . Osteoporosis 07/30/2016  . Iron deficiency anemia due to chronic blood loss 01/07/2016  . Malabsorption of iron 01/07/2016  . Postoperative state  01/17/2015  . HOCM (hypertrophic obstructive cardiomyopathy) ([place]) 01/09/2014  . Breast cancer ([place]) 01/09/2014  . Coronary atherosclerosis of native coronary artery  12/20/2013  . CAD (coronary artery disease)   . Hyperlipidemia   . Unstable angina ([place]) 12/19/2013  . Breast cancer of upper-outer quadrant of left female breast ([place]) 11/05/2013  . DCIS (ductal carcinoma in situ) of breast 10/26/2013  . Hypertension 04/05/2012  . Cardiac murmur 04/05/2012  . UNSPECIFIED HEARING LOSS 08/07/2010  . LOW BACK PAIN 04/22/2009  . CONSTIPATION 11/04/2008  . COLONIC POLYPS, HX OF 11/01/2008  . ALLERGIC RHINITIS 06/27/2007  . Hypothyroidism 06/22/2007    Lion Fernandez C. Ladeja Pelham PT, DPT 05/17/18 10:13 AM   [facility] [address] [place], [place], [address] Phone: [phone]   Fax:  [phone]  Name: MARTINIQUE PIZZIMENTI MRN: [id number] Date of Birth: [birth year]

## 2018-05-23 ENCOUNTER — Encounter: Payer: Self-pay | Admitting: Physical Therapy

## 2018-05-23 ENCOUNTER — Ambulatory Visit: Payer: Medicare Other | Admitting: Physical Therapy

## 2018-05-23 VITALS — BP 137/90 | HR 75

## 2018-05-23 DIAGNOSIS — M6281 Muscle weakness (generalized): Secondary | ICD-10-CM

## 2018-05-23 DIAGNOSIS — M5441 Lumbago with sciatica, right side: Secondary | ICD-10-CM | POA: Diagnosis not present

## 2018-05-23 DIAGNOSIS — G8929 Other chronic pain: Secondary | ICD-10-CM

## 2018-05-23 DIAGNOSIS — R2681 Unsteadiness on feet: Secondary | ICD-10-CM

## 2018-05-23 DIAGNOSIS — R600 Localized edema: Secondary | ICD-10-CM

## 2018-05-23 DIAGNOSIS — R2689 Other abnormalities of gait and mobility: Secondary | ICD-10-CM

## 2018-05-23 DIAGNOSIS — R262 Difficulty in walking, not elsewhere classified: Secondary | ICD-10-CM

## 2018-05-23 DIAGNOSIS — M25561 Pain in right knee: Secondary | ICD-10-CM

## 2018-05-23 NOTE — Therapy (Signed)
[place], [place], [address] Phone: [phone]   Fax:  [phone]  Physical Therapy Treatment  Patient Details  Name: Kim [name] MRN: [id number] Date of Birth: 1935/06/04 Referring Provider: [name]   Encounter Date: 05/23/2018  PT End of Session - 05/23/18 1632    Visit Number  9    Number of Visits  17    Date for PT Re-Evaluation  06/16/18    Authorization Type  [employer] Medicare (PN visit 10, KX visit 15)    PT Start Time  0425    PT Stop Time  0510    PT Time Calculation (min)  45 min       Past Medical History:  Diagnosis Date  . Allergic rhinitis   . Allergy   . Arthritis    hands  . Breast cancer of upper-outer quadrant of left female breast ([place]) 11/08/13   finished chemo and radiation 2015  . CAD (coronary artery disease)    a. 12/2013 Cath/PCI: LM nl, LAD 20p, 41m, 30d, D1 30p, LCX 20p, 72m, OM1 20, Mo2 40p, RCA 30p, 67m (4.0x16 Promus Premier DES), 20d, RPL 30, RPDA 70p, 26m, EF 65-70%.  . Diverticulosis   . HOH (hard of hearing)    bilateral, wears hearing aids  . Hx of adenomatous colonic polyps 02/1999  . Hyperlipidemia   . Hypertension   . Hypertrophic obstructive cardiomyopathy (HOCM) [facility])    Cardiologist is Dr. [name]  . Hypothyroidism   . Iron deficiency anemia due to chronic blood loss 01/07/2016  . Low back pain    gets ESI from Dr. Rennis Harding  . Malabsorption of iron 01/07/2016  . Myocardial infarction ([place]) 12/2013   very mild-with first chemo -placed stent x1  . Personal history of radiation therapy 2015  . S/P radiation therapy 05/14/2014-06/26/2014   1) Left Breast  / 50 Gy in 25 fractions, 2) Left Supraclavicular fossa/ 47.5 Gy in 25 fractions, 3) Left Posterior Axillary boost / 4.825 Gy in 25 fractions, 4) Left Breast boost / 10 Gy in 5 fractions   . SVD (spontaneous vaginal delivery)    x 2  . Tubulovillous adenoma of colon 12/2009   with HGD  . Urinary  tract infection   . Wears hearing aid    left and right     Past Surgical History:  Procedure Laterality Date  . ABDOMINAL HYSTERECTOMY    . ANTERIOR AND POSTERIOR REPAIR N/A 01/17/2015   Procedure: CYSTOCELE REPAIR ;  Surgeon: Princess Bruins, MD;  Location: [facility];  Service: Gynecology;  Laterality: N/A;  . BILATERAL SALPINGOOPHORECTOMY     see Laurin Coder NP for GYN exams  . BREAST BIOPSY Left 10/18/2013  . BREAST BIOPSY Left 10/31/2013  . BREAST LUMPECTOMY Left 11/05/2013  . BREAST LUMPECTOMY WITH NEEDLE LOCALIZATION AND AXILLARY LYMPH NODE DISSECTION Left 11/05/2013   Procedure: LEFT BREAST LUMPECTOMY WITH NEEDLE LOCALIZATION and axillary lymph Node Dissection;  Surgeon: Edward Jolly, MD;  Location: [facility];  Service: General;  Laterality: Left;  . BREAST SURGERY  11/2013   left lumpectomy  . CATARACT EXTRACTION W/ INTRAOCULAR LENS  IMPLANT, BILATERAL  2012   bilateral  . COLONOSCOPY  11/04/2015   per Dr. Fuller Plan, adenomatous polyps, no repeats planned   . EYE SURGERY  11/22/2006   cataracts, bilateral, intraocular lens implant  . JOINT REPLACEMENT     2019  . LEFT HEART CATHETERIZATION WITH CORONARY ANGIOGRAM  N/A 12/19/2013   Procedure: LEFT HEART CATHETERIZATION WITH CORONARY ANGIOGRAM;  Surgeon: Burnell Blanks, MD;  Location: [facility] CATH LAB;  Service: Cardiovascular;  Laterality: N/A;  . lumbar epidural steroid injection     lumber spinal stenosis  . LUMBAR LAMINECTOMY/DECOMPRESSION MICRODISCECTOMY Right 03/01/2018   Procedure: RIGHT LUMBAR FOUR- LUMBAR FIVE LAMINECTOMY/MICRODISCECTOMY;  Surgeon: [name], MD;  Location: [facility] [place];  Service: Neurosurgery;  Laterality: Right;  . POLYPECTOMY    . PORTACATH PLACEMENT Right 12/11/2013   Procedure: INSERTION PORT-A-CATH;  Surgeon: Edward Jolly, MD;  Location: [place] [facility];  Service: General;  Laterality: Right;  Subclavian Vein;   . TOTAL KNEE ARTHROPLASTY Right 10/24/2017    Procedure: TOTAL RIGHT KNEE ARTHROPLASTY;  Surgeon: Gaynelle Arabian, MD;  Location[facility];  Service: Orthopedics;  Laterality: Right;    Vitals:   05/23/18 1703  BP: 137/90  Pulse: 75  SpO2: 98%    Subjective Assessment - 05/23/18 1627    Subjective  My right leg has been numb and tingling. I am having more lower back pain this week. My feet are burning and tingling.     Currently in Pain?  Yes    Pain Score  6     Pain Location  Back    Pain Orientation  Right;Left;Lower    Pain Descriptors / Indicators  Aching    Pain Radiating Towards  Rt lower leg and foot, Lt foot    Aggravating Factors   not sure why pain is more this week.                        [facility] Adult PT Treatment/Exercise - 05/23/18 0001      Lumbar Exercises: Stretches   Lower Trunk Rotation  5 reps;10 seconds    Prone on Elbows Stretch  1 rep;60 seconds    Prone on Elbows Stretch Limitations  no change in LE sx     Piriformis Stretch  Both;20 seconds      Knee/Hip Exercises: Stretches   Other Knee/Hip Stretches  single knee to chest bilat 20 s      Knee/Hip Exercises: Aerobic   Nustep  5 min L5 UE & LE      Knee/Hip Exercises: Sidelying   Clams  20 reps                PT Short Term Goals - 05/15/18 0945      PT SHORT TERM GOAL #1   Title  Patient to be independent with appropriate HEP, to be updated PRN     Status  Achieved      PT SHORT TERM GOAL #2   Title  Patient to demonstrate improved functional strength as evidenced by ability to peform sit to stand without use of UEs    Status  Achieved      PT SHORT TERM GOAL #3   Title  Patient to score at least 40 on Berg balance test to demonstrate reduced risk of falling and improved functional balance skills     Baseline  43    Status  Achieved      PT SHORT TERM GOAL #4   Title  Patient to be able to ambulate house hold distances with SPC and minimal unsteadiness in order to show improved functional mobility and balance      Status  Achieved        PT Long Term Goals - 05/15/18 0945  PT LONG TERM GOAL #1   Title  Patient to demonstrate MMT as being at least 4/5 in all tested muscle groups in order to show improved functional strength and stability     Baseline  see flowsheet    Status  On-going      PT LONG TERM GOAL #2   Title  Patient to score at least 45/56 on the Berg in order to show improved stability and reduced fall risk     Baseline  43/56    Status  On-going      PT LONG TERM GOAL #3   Title  Patient to be able to walk household distances without assistive device and will be able to safely ambulate with SPC in the community with mnimal unsteadiness and good safety awareness in order to show improved mobility and safety    Baseline  not using AD at this time but may begin carrying cane for long distances for safety    Status  Achieved      PT LONG TERM GOAL #4   Title  Patient to report pain as being no more than 4/10 at worst in order to improve QOL and functional task tolerance     Baseline  just uncomfortable    Status  Achieved            Plan - 05/23/18 1649    Clinical Impression Statement  Pt arrives reporting bilateral foot N/T and Rt lower leg N/T with onset over the last week. She also notes dizziness wihen sitting up from her recliner which takes a few minutes to subside prior to being able to stand. She asks if physical therapy can call her MD about her symptoms. She reports compliance with HEP 3 x perday and riding stationary bike daily.  After speaking to her further, she reports she stopped taking Neurotin 2 weeks ago without recommendation from her MD. She reports seeing MD last week prior to onset of LE symptoms and does not recall telling him she discontinued this medicine. We remained on mat for exericises today due to dizziness and reviewed her new HEP. Asked pt to call MD tomorrow to report she disontinued Neurotin and report her current symptoms. She was asked to  go to ED with any SOB or chest pain.     PT Next Visit Plan  core strengthening, dynamic balance, gait; did she call MD about LE symptoms and dizziness?     PT Home Exercise Plan  Eval: LAQs with yellow TB, seated clams with yellow TB, standing heel raises; hooklying marching, clams, hamstring curls; SKTC, LTR, piriformis stretch    Consulted and Agree with Plan of Care  Patient       Patient will benefit from skilled therapeutic intervention in order to improve the following deficits and impairments:  Abnormal gait, Improper body mechanics, Pain, Postural dysfunction, Decreased activity tolerance, Decreased strength, Decreased balance, Decreased knowledge of use of DME, Hypomobility, Impaired perceived functional ability, Decreased knowledge of precautions, Decreased safety awareness, Difficulty walking, Impaired flexibility  Visit Diagnosis: Chronic bilateral low back pain with right-sided sciatica  Muscle weakness (generalized)  Unsteadiness on feet  Difficulty in walking, not elsewhere classified  Chronic pain of right knee  Other abnormalities of gait and mobility  Localized edema     Problem List Patient Active Problem List   Diagnosis Date Noted  . HNP (herniated nucleus pulposus), lumbar 03/01/2018  . History of total knee replacement, right 11/13/2017  . OA (osteoarthritis) of knee  10/24/2017  . Chronic pain of right knee 06/17/2017  . Osteoporosis 07/30/2016  . Iron deficiency anemia due to chronic blood loss 01/07/2016  . Malabsorption of iron 01/07/2016  . Postoperative state 01/17/2015  . HOCM (hypertrophic obstructive cardiomyopathy) ([place]) 01/09/2014  . Breast cancer ([place]) 01/09/2014  . Coronary atherosclerosis of native coronary artery 12/20/2013  . CAD (coronary artery disease)   . Hyperlipidemia   . Unstable angina ([place]) 12/19/2013  . Breast cancer of upper-outer quadrant of left female breast ([place]) 11/05/2013  . DCIS (ductal carcinoma in situ) of breast  10/26/2013  . Hypertension 04/05/2012  . Cardiac murmur 04/05/2012  . UNSPECIFIED HEARING LOSS 08/07/2010  . LOW BACK PAIN 04/22/2009  . CONSTIPATION 11/04/2008  . COLONIC POLYPS, HX OF 11/01/2008  . ALLERGIC RHINITIS 06/27/2007  . Hypothyroidism 06/22/2007    Dorene Ar, PTA 05/23/2018, 5:22 PM  [facility] [address] [place], [place], [address] Phone: [phone]   Fax:  [phone]  Name: Kim [name] MRN: [id number] Date of Birth: 11-28-1934

## 2018-05-25 ENCOUNTER — Ambulatory Visit: Payer: Medicare Other | Admitting: Physical Therapy

## 2018-05-25 ENCOUNTER — Encounter: Payer: Self-pay | Admitting: Physical Therapy

## 2018-05-25 DIAGNOSIS — G8929 Other chronic pain: Secondary | ICD-10-CM

## 2018-05-25 DIAGNOSIS — M5441 Lumbago with sciatica, right side: Principal | ICD-10-CM

## 2018-05-25 DIAGNOSIS — R262 Difficulty in walking, not elsewhere classified: Secondary | ICD-10-CM

## 2018-05-25 DIAGNOSIS — M6281 Muscle weakness (generalized): Secondary | ICD-10-CM

## 2018-05-25 DIAGNOSIS — R2681 Unsteadiness on feet: Secondary | ICD-10-CM

## 2018-05-25 NOTE — Patient Instructions (Signed)
HIP FLEXOR STRETCH  In bed roll small pad and place under knee if needed to stretch leg/hip straight. Hold 1 minute or longer .  Must be a light stretch.  May need to flex opposite knee.  Warm shower prior to stretch may be helpful. Repeat opposite hip.    This should feel good.

## 2018-05-25 NOTE — Therapy (Addendum)
[place], [place], [address] Phone: [phone]   Fax:  [phone]  Physical Therapy Treatment  Progress Note Reporting Period 04/17/2018 to 05/25/2018  See note below for Objective Data and Assessment of Progress/Goals.     Patient Details  Name: Kim [name] MRN: [id number] Date of Birth: Sep 18, 1935 Referring Provider: [name]   Encounter Date: 05/25/2018  PT End of Session - 05/25/18 1819    Visit Number  10    Number of Visits  17    Date for PT Re-Evaluation  06/16/18    Authorization - Visit Number  7    Authorization - Number of Visits  10    PT Start Time  1330    PT Stop Time  1420    PT Time Calculation (min)  50 min    Activity Tolerance  Patient tolerated treatment well    Behavior During Therapy  [facility] for tasks assessed/performed       Past Medical History:  Diagnosis Date  . Allergic rhinitis   . Allergy   . Arthritis    hands  . Breast cancer of upper-outer quadrant of left female breast ([place]) 11/08/13   finished chemo and radiation 2015  . CAD (coronary artery disease)    a. 12/2013 Cath/PCI: LM nl, LAD 20p, 64m, 30d, D1 30p, LCX 20p, 56m, OM1 20, Mo2 40p, RCA 30p, 2m (4.0x16 Promus Premier DES), 20d, RPL 30, RPDA 70p, 28m, EF 65-70%.  . Diverticulosis   . HOH (hard of hearing)    bilateral, wears hearing aids  . Hx of adenomatous colonic polyps 02/1999  . Hyperlipidemia   . Hypertension   . Hypertrophic obstructive cardiomyopathy (HOCM) [facility])    Cardiologist is Dr. [name]  . Hypothyroidism   . Iron deficiency anemia due to chronic blood loss 01/07/2016  . Low back pain    gets ESI from Dr. Rennis Harding  . Malabsorption of iron 01/07/2016  . Myocardial infarction ([place]) 12/2013   very mild-with first chemo -placed stent x1  . Personal history of radiation therapy 2015  . S/P radiation therapy 05/14/2014-06/26/2014   1) Left Breast  / 50 Gy in 25 fractions, 2) Left  Supraclavicular fossa/ 47.5 Gy in 25 fractions, 3) Left Posterior Axillary boost / 4.825 Gy in 25 fractions, 4) Left Breast boost / 10 Gy in 5 fractions   . SVD (spontaneous vaginal delivery)    x 2  . Tubulovillous adenoma of colon 12/2009   with HGD  . Urinary tract infection   . Wears hearing aid    left and right     Past Surgical History:  Procedure Laterality Date  . ABDOMINAL HYSTERECTOMY    . ANTERIOR AND POSTERIOR REPAIR N/A 01/17/2015   Procedure: CYSTOCELE REPAIR ;  Surgeon: Princess Bruins, MD;  Location: [facility];  Service: Gynecology;  Laterality: N/A;  . BILATERAL SALPINGOOPHORECTOMY     see Laurin Coder NP for GYN exams  . BREAST BIOPSY Left 10/18/2013  . BREAST BIOPSY Left 10/31/2013  . BREAST LUMPECTOMY Left 11/05/2013  . BREAST LUMPECTOMY WITH NEEDLE LOCALIZATION AND AXILLARY LYMPH NODE DISSECTION Left 11/05/2013   Procedure: LEFT BREAST LUMPECTOMY WITH NEEDLE LOCALIZATION and axillary lymph Node Dissection;  Surgeon: Edward Jolly, MD;  Location: [place];  Service: General;  Laterality: Left;  . BREAST SURGERY  11/2013   left lumpectomy  . CATARACT EXTRACTION W/ INTRAOCULAR LENS  IMPLANT, BILATERAL  2012  bilateral  . COLONOSCOPY  11/04/2015   per Dr. Fuller Plan, adenomatous polyps, no repeats planned   . EYE SURGERY  11/22/2006   cataracts, bilateral, intraocular lens implant  . JOINT REPLACEMENT     2019  . LEFT HEART CATHETERIZATION WITH CORONARY ANGIOGRAM N/A 12/19/2013   Procedure: LEFT HEART CATHETERIZATION WITH CORONARY ANGIOGRAM;  Surgeon: Burnell Blanks, MD;  [facility] C[facility] LAB;  Service: Cardiovascular;  Laterality: N/A;  . lumbar epidural steroid injection     lumber spinal stenosis  . LUMBAR LAMINECTOMY/DECOMPRESSION MICRODISCECTOMY Right 03/01/2018   Procedure: RIGHT LUMBAR FOUR- LUMBAR FIVE LAMINECTOMY/MICRODISCECTOMY;  Surgeon: [name], MD;  Location: [place];  Service: Neurosurgery;  Laterality: Right;  . POLYPECTOMY     . PORTACATH PLACEMENT Right 12/11/2013   Procedure: INSERTION PORT-A-CATH;  Surgeon: Edward Jolly, MD;  Location: [place];  Service: General;  Laterality: Right;  Subclavian Vein;   . TOTAL KNEE ARTHROPLASTY Right 10/24/2017   Procedure: TOTAL RIGHT KNEE ARTHROPLASTY;  Surgeon: Gaynelle Arabian, MD;  Location: [facility];  Service: Orthopedics;  Laterality: Right;    There were no vitals filed for this visit.  Subjective Assessment - 05/25/18 1334    Subjective  Patient did not call MD.  She opted to wait until Pt is done prior to calling.  Dizziness continues when she gets out of recliner and bed.     Currently in Pain?  Yes    Pain Score  7     Pain Location  Back    Pain Orientation  Right;Left;Lower    Pain Descriptors / Indicators  Aching    Pain Type  Chronic pain    Pain Radiating Towards  right leg and both feet numb    Pain Frequency  Intermittent                       [facility] Adult PT Treatment/Exercise - 05/25/18 0001      High Level Balance   High Level Balance Comments  dizzy with walking back  3r set of reverse walking.      Lumbar Exercises: Stretches   Passive Hamstring Stretch  3 reps;30 seconds    Lower Trunk Rotation  5 reps   pain  6/10  pain   Hip Flexor Stretch  3 reps;60 seconds    Hip Flexor Stretch Limitations  HEP    Pelvic Tilt  5 reps    Piriformis Stretch  3 reps;30 seconds   feels good     Lumbar Exercises: Standing   Heel Raises  10 reps    Wall Slides  10 reps    Wall Slides Limitations  heavy cues,  small motions,  knees painful      Knee/Hip Exercises: Sidelying   Clams  10      Manual Therapy   Manual Therapy  Soft tissue mobilization    Manual therapy comments  low back right , trigger point released             PT Education - 05/25/18 1819    Education Details  HEP    Person(s) Educated  Patient    Methods  Explanation;Demonstration;Verbal cues;Handout    Comprehension  Verbalized  understanding;Returned demonstration       PT Short Term Goals - 05/15/18 0945      PT SHORT TERM GOAL #1   Title  Patient to be independent with appropriate HEP, to be updated PRN     Status  Achieved      PT SHORT TERM GOAL #2   Title  Patient to demonstrate improved functional strength as evidenced by ability to peform sit to stand without use of UEs    Status  Achieved      PT SHORT TERM GOAL #3   Title  Patient to score at least 40 on Berg balance test to demonstrate reduced risk of falling and improved functional balance skills     Baseline  43    Status  Achieved      PT SHORT TERM GOAL #4   Title  Patient to be able to ambulate house hold distances with SPC and minimal unsteadiness in order to show improved functional mobility and balance     Status  Achieved        PT Long Term Goals - 05/15/18 0945      PT LONG TERM GOAL #1   Title  Patient to demonstrate MMT as being at least 4/5 in all tested muscle groups in order to show improved functional strength and stability     Baseline  see flowsheet    Status  On-going      PT LONG TERM GOAL #2   Title  Patient to score at least 45/56 on the Berg in order to show improved stability and reduced fall risk     Baseline  43/56    Status  On-going      PT LONG TERM GOAL #3   Title  Patient to be able to walk household distances without assistive device and will be able to safely ambulate with SPC in the community with mnimal unsteadiness and good safety awareness in order to show improved mobility and safety    Baseline  not using AD at this time but may begin carrying cane for long distances for safety    Status  Achieved      PT LONG TERM GOAL #4   Title  Patient to report pain as being no more than 4/10 at worst in order to improve QOL and functional task tolerance     Baseline  just uncomfortable    Status  Achieved            Plan - 05/25/18 1820    Clinical Impression Statement  Dizziness and leg symptoms  continue,  see Subjective.  Some of back pain is relieved with hip flexor stretching.  less pain reported at end of session.     PT Next Visit Plan  core strengthening, dynamic balance, gait; did she call MD about LE symptoms and dizziness? ( No)  stretch hip flexors    PT Home Exercise Plan  Eval: LAQs with yellow TB, seated clams with yellow TB, standing heel raises; hooklying marching, clams, hamstring curls; SKTC, LTR, piriformis stretch,  hip flexor stretch    Consulted and Agree with Plan of Care  Patient       Patient will benefit from skilled therapeutic intervention in order to improve the following deficits and impairments:     Visit Diagnosis: Chronic bilateral low back pain with right-sided sciatica  Muscle weakness (generalized)  Unsteadiness on feet  Difficulty in walking, not elsewhere classified     Problem List Patient Active Problem List   Diagnosis Date Noted  . HNP (herniated nucleus pulposus), lumbar 03/01/2018  . History of total knee replacement, right 11/13/2017  . OA (osteoarthritis) of knee 10/24/2017  . Chronic pain of right knee 06/17/2017  . Osteoporosis 07/30/2016  .  Iron deficiency anemia due to chronic blood loss 01/07/2016  . Malabsorption of iron 01/07/2016  . Postoperative state 01/17/2015  . HOCM (hypertrophic obstructive cardiomyopathy) ([place]) 01/09/2014  . Breast cancer ([place]) 01/09/2014  . Coronary atherosclerosis of native coronary artery 12/20/2013  . CAD (coronary artery disease)   . Hyperlipidemia   . Unstable angina ([place]) 12/19/2013  . Breast cancer of upper-outer quadrant of left female breast ([place]) 11/05/2013  . DCIS (ductal carcinoma in situ) of breast 10/26/2013  . Hypertension 04/05/2012  . Cardiac murmur 04/05/2012  . UNSPECIFIED HEARING LOSS 08/07/2010  . LOW BACK PAIN 04/22/2009  . CONSTIPATION 11/04/2008  . COLONIC POLYPS, HX OF 11/01/2008  . ALLERGIC RHINITIS 06/27/2007  . Hypothyroidism 06/22/2007    Sterlin Knightly  PTA 05/25/2018, 6:51 PM  [facility] [address] [place], [place], [address] Phone: [phone]   Fax:  [phone]  Name: Kim [name] MRN: [id number] Date of Birth: January 10, 1935  Gay Filler. Hightower PT, DPT 05/29/18 9:52 AM

## 2018-05-25 NOTE — Therapy (Signed)
[place], [place], [address] Phone: [phone]   Fax:  [phone]  Physical Therapy Treatment  Patient Details  Name: Kim [name] MRN: [id number] Date of Birth: March 25, 1935 Referring Provider: [name]   Encounter Date: 05/25/2018  PT End of Session - 05/25/18 1819    Visit Number  10    Number of Visits  17    Date for PT Re-Evaluation  06/16/18    Authorization - Visit Number  7    Authorization - Number of Visits  10    PT Start Time  1330    PT Stop Time  1420    PT Time Calculation (min)  50 min    Activity Tolerance  Patient tolerated treatment well    Behavior During Therapy  [facility] for tasks assessed/performed       Past Medical History:  Diagnosis Date  . Allergic rhinitis   . Allergy   . Arthritis    hands  . Breast cancer of upper-outer quadrant of left female breast ([place]) 11/08/13   finished chemo and radiation 2015  . CAD (coronary artery disease)    a. 12/2013 Cath/PCI: LM nl, LAD 20p, 20m, 30d, D1 30p, LCX 20p, 26m, OM1 20, Mo2 40p, RCA 30p, 49m (4.0x16 Promus Premier DES), 20d, RPL 30, RPDA 70p, 65m, EF 65-70%.  . Diverticulosis   . HOH (hard of hearing)    bilateral, wears hearing aids  . Hx of adenomatous colonic polyps 02/1999  . Hyperlipidemia   . Hypertension   . Hypertrophic obstructive cardiomyopathy (HOCM) [facility])    Cardiologist is Dr. [name]  . Hypothyroidism   . Iron deficiency anemia due to chronic blood loss 01/07/2016  . Low back pain    gets ESI from Dr. Rennis Harding  . Malabsorption of iron 01/07/2016  . Myocardial infarction ([place]) 12/2013   very mild-with first chemo -placed stent x1  . Personal history of radiation therapy 2015  . S/P radiation therapy 05/14/2014-06/26/2014   1) Left Breast  / 50 Gy in 25 fractions, 2) Left Supraclavicular fossa/ 47.5 Gy in 25 fractions, 3) Left Posterior Axillary boost / 4.825 Gy in 25 fractions, 4) Left Breast boost /  10 Gy in 5 fractions   . SVD (spontaneous vaginal delivery)    x 2  . Tubulovillous adenoma of colon 12/2009   with HGD  . Urinary tract infection   . Wears hearing aid    left and right     Past Surgical History:  Procedure Laterality Date  . ABDOMINAL HYSTERECTOMY    . ANTERIOR AND POSTERIOR REPAIR N/A 01/17/2015   Procedure: CYSTOCELE REPAIR ;  Surgeon: Princess Bruins, MD;  Location: [facility];  Service: Gynecology;  Laterality: N/A;  . BILATERAL SALPINGOOPHORECTOMY     see Laurin Coder NP for GYN exams  . BREAST BIOPSY Left 10/18/2013  . BREAST BIOPSY Left 10/31/2013  . BREAST LUMPECTOMY Left 11/05/2013  . BREAST LUMPECTOMY WITH NEEDLE LOCALIZATION AND AXILLARY LYMPH NODE DISSECTION Left 11/05/2013   Procedure: LEFT BREAST LUMPECTOMY WITH NEEDLE LOCALIZATION and axillary lymph Node Dissection;  Surgeon: Edward Jolly, MD;  Location: [place];  Service: General;  Laterality: Left;  . BREAST SURGERY  11/2013   left lumpectomy  . CATARACT EXTRACTION W/ INTRAOCULAR LENS  IMPLANT, BILATERAL  2012   bilateral  . COLONOSCOPY  11/04/2015   per Dr. Fuller Plan, adenomatous polyps, no repeats planned   . EYE SURGERY  11/22/2006   cataracts, bilateral, intraocular lens implant  . JOINT REPLACEMENT     2019  . LEFT HEART CATHETERIZATION WITH CORONARY ANGIOGRAM N/A 12/19/2013   Procedure: LEFT HEART CATHETERIZATION WITH CORONARY ANGIOGRAM;  Surgeon: Burnell Blanks, MD;  Location: [facility] CATH LAB;  Service: Cardiovascular;  Laterality: N/A;  . lumbar epidural steroid injection     lumber spinal stenosis  . LUMBAR LAMINECTOMY/DECOMPRESSION MICRODISCECTOMY Right 03/01/2018   Procedure: RIGHT LUMBAR FOUR- LUMBAR FIVE LAMINECTOMY/MICRODISCECTOMY;  Surgeon: [name], MD;  Location: [place];  Service: Neurosurgery;  Laterality: Right;  . POLYPECTOMY    . PORTACATH PLACEMENT Right 12/11/2013   Procedure: INSERTION PORT-A-CATH;  Surgeon: Edward Jolly, MD;  Location: [place];  Service: General;  Laterality: Right;  Subclavian Vein;   . TOTAL KNEE ARTHROPLASTY Right 10/24/2017   Procedure: TOTAL RIGHT KNEE ARTHROPLASTY;  Surgeon: Gaynelle Arabian, MD;  [facility];  Service: Orthopedics;  Laterality: Right;    There were no vitals filed for this visit.  Subjective Assessment - 05/25/18 1334    Subjective  Patient did not call MD.  She opted to wait until Pt is done prior to calling.  Dizziness continues when she gets out of recliner and bed.     Currently in Pain?  Yes    Pain Score  7     Pain Location  Back    Pain Orientation  Right;Left;Lower    Pain Descriptors / Indicators  Aching    Pain Type  Chronic pain    Pain Radiating Towards  right leg and both feet numb    Pain Frequency  Intermittent                       [facility] Adult PT Treatment/Exercise - 05/25/18 0001      High Level Balance   High Level Balance Comments  dizzy with walking back  3r set of reverse walking. airex challanging,       Lumbar Exercises: Stretches   Passive Hamstring Stretch  3 reps;30 seconds    Lower Trunk Rotation  5 reps   pain  6/10  pain   Hip Flexor Stretch  3 reps;60 seconds    Hip Flexor Stretch Limitations  HEP    Pelvic Tilt  5 reps    Piriformis Stretch  3 reps;30 seconds   feels good     Lumbar Exercises: Standing   Heel Raises  10 reps    Wall Slides  10 reps    Wall Slides Limitations  heavy cues,  small motions,  knees painful      Knee/Hip Exercises: Sidelying   Clams  10      Manual Therapy   Manual Therapy  Soft tissue mobilization    Manual therapy comments  low back right , trigger point released             PT Education - 05/25/18 1819    Education Details  HEP    Person(s) Educated  Patient    Methods  Explanation;Demonstration;Verbal cues;Handout    Comprehension  Verbalized understanding;Returned demonstration       PT Short Term Goals - 05/15/18 0945      PT SHORT TERM GOAL #1    Title  Patient to be independent with appropriate HEP, to be updated PRN     Status  Achieved      PT SHORT TERM GOAL #2   Title  Patient to demonstrate  improved functional strength as evidenced by ability to peform sit to stand without use of UEs    Status  Achieved      PT SHORT TERM GOAL #3   Title  Patient to score at least 40 on Berg balance test to demonstrate reduced risk of falling and improved functional balance skills     Baseline  43    Status  Achieved      PT SHORT TERM GOAL #4   Title  Patient to be able to ambulate house hold distances with [facility] and minimal unsteadiness in order to show improved functional mobility and balance     Status  Achieved        PT Long Term Goals - 05/15/18 0945      PT LONG TERM GOAL #1   Title  Patient to demonstrate MMT as being at least 4/5 in all tested muscle groups in order to show improved functional strength and stability     Baseline  see flowsheet    Status  On-going      PT LONG TERM GOAL #2   Title  Patient to score at least 45/56 on the Berg in order to show improved stability and reduced fall risk     Baseline  43/56    Status  On-going      PT LONG TERM GOAL #3   Title  Patient to be able to walk household distances without assistive device and will be able to safely ambulate with SPC in the community with mnimal unsteadiness and good safety awareness in order to show improved mobility and safety    Baseline  not using AD at this time but may begin carrying cane for long distances for safety    Status  Achieved      PT LONG TERM GOAL #4   Title  Patient to report pain as being no more than 4/10 at worst in order to improve QOL and functional task tolerance     Baseline  just uncomfortable    Status  Achieved            Plan - 05/25/18 1820    Clinical Impression Statement  Dizziness and leg symptoms continue,  see Subjective.  Some of back pain is relieved with hip flexor stretching.  less pain reported at end  of session.     PT Next Visit Plan  core strengthening, dynamic balance, gait; did she call MD about LE symptoms and dizziness? ( No)  stretch hip flexors    PT Home Exercise Plan  Eval: LAQs with yellow TB, seated clams with yellow TB, standing heel raises; hooklying marching, clams, hamstring curls; SKTC, LTR, piriformis stretch,  hip flexor stretch    Consulted and Agree with Plan of Care  Patient       Patient will benefit from skilled therapeutic intervention in order to improve the following deficits and impairments:     Visit Diagnosis: Chronic bilateral low back pain with right-sided sciatica  Muscle weakness (generalized)  Unsteadiness on feet  Difficulty in walking, not elsewhere classified     Problem List Patient Active Problem List   Diagnosis Date Noted  . HNP (herniated nucleus pulposus), lumbar 03/01/2018  . History of total knee replacement, right 11/13/2017  . OA (osteoarthritis) of knee 10/24/2017  . Chronic pain of right knee 06/17/2017  . Osteoporosis 07/30/2016  . Iron deficiency anemia due to chronic blood loss 01/07/2016  . Malabsorption of iron 01/07/2016  . Postoperative  state 01/17/2015  . HOCM (hypertrophic obstructive cardiomyopathy) ([place]) 01/09/2014  . Breast cancer ([place]) 01/09/2014  . Coronary atherosclerosis of native coronary artery 12/20/2013  . CAD (coronary artery disease)   . Hyperlipidemia   . Unstable angina ([place]) 12/19/2013  . Breast cancer of upper-outer quadrant of left female breast ([place]) 11/05/2013  . DCIS (ductal carcinoma in situ) of breast 10/26/2013  . Hypertension 04/05/2012  . Cardiac murmur 04/05/2012  . UNSPECIFIED HEARING LOSS 08/07/2010  . LOW BACK PAIN 04/22/2009  . CONSTIPATION 11/04/2008  . COLONIC POLYPS, HX OF 11/01/2008  . ALLERGIC RHINITIS 06/27/2007  . Hypothyroidism 06/22/2007    HARRIS,KAREN  PTA 05/25/2018, 6:23 PM  [facility] [address] [place], [place], [address] Phone: [phone]   Fax:  [phone]  Name: Kim [name] MRN: [id number] Date of Birth: 1934/11/13

## 2018-05-30 ENCOUNTER — Ambulatory Visit: Payer: Medicare Other | Admitting: Physical Therapy

## 2018-05-30 ENCOUNTER — Encounter: Payer: Self-pay | Admitting: Physical Therapy

## 2018-05-30 DIAGNOSIS — M6281 Muscle weakness (generalized): Secondary | ICD-10-CM

## 2018-05-30 DIAGNOSIS — G8929 Other chronic pain: Secondary | ICD-10-CM

## 2018-05-30 DIAGNOSIS — M5441 Lumbago with sciatica, right side: Principal | ICD-10-CM

## 2018-05-30 DIAGNOSIS — R262 Difficulty in walking, not elsewhere classified: Secondary | ICD-10-CM

## 2018-05-30 DIAGNOSIS — R2681 Unsteadiness on feet: Secondary | ICD-10-CM

## 2018-05-30 NOTE — Patient Instructions (Addendum)
Calf Stretch    Place one leg forward, bent, other leg behind and straight. Lean forward keeping back heel flat. Hold __20-30__ seconds. Repeat with other leg forward. Repeat _3___ times. Do _1-2___ sessions per day.  http://gt2.exer.us/478   Copyright  VHI. All rights reserved.     Knee flexion standing at counter  Daily,  Hold 1 second  10 to 30 X Pick foot off floor bending knee. Lower slowly Repeat other knee

## 2018-05-30 NOTE — Therapy (Addendum)
[name], [place], [address] Phone: [phone]   Fax:  [phone]  Physical Therapy Treatment/Discharge  Patient Details  Name: Kim [name] MRN: [id number] Date of Birth: [birth year] Referring Provider: [name]   Encounter Date: 05/30/2018  PT End of Session - 05/30/18 0918    Visit Number  11    Number of Visits  17    Date for PT Re-Evaluation  06/16/18    Authorization - Visit Number  8    Authorization - Number of Visits  10    PT Start Time  0716    PT Stop Time  0758    PT Time Calculation (min)  42 min    Activity Tolerance  Patient tolerated treatment well    Behavior During Therapy  [facility] for tasks assessed/performed       Past Medical History:  Diagnosis Date  . Allergic rhinitis   . Allergy   . Arthritis    hands  . Breast cancer of upper-outer quadrant of left female breast ([place]) 11/08/13   finished chemo and radiation 2015  . CAD (coronary artery disease)    a. 12/2013 Cath/PCI: LM nl, LAD 20p, 4m 30d, D1 30p, LCX 20p, 378mOM1 20, Mo2 40p, RCA 30p, 9971m.0x16 Promus Premier DES), 20d, RPL 30, RPDA 70p, 55m78m 65-70%.  . Diverticulosis   . HOH (hard of hearing)    bilateral, wears hearing aids  . Hx of adenomatous colonic polyps 02/1999  . Hyperlipidemia   . Hypertension   . [facility] is Dr. DaltLoralie ChampagneHypothyroidism   . Iron deficiency anemia due to chronic blood loss 01/07/2016  . Low back pain    gets ESI from Dr. Max Rennis HardingMalabsorption of iron 01/07/2016  . Myocardial infarction (HCC)[place]2015   very mild-with first chemo -placed stent x1  . Personal history of radiation therapy 2015  . S/P radiation therapy 05/14/2014-06/26/2014   1) Left Breast  / 50 Gy in 25 fractions, 2) Left Supraclavicular fossa/ 47.5 Gy in 25 fractions, 3) Left Posterior Axillary boost / 4.825 Gy in 25 fractions, 4) Left Breast  boost / 10 Gy in 5 fractions   . SVD (spontaneous vaginal delivery)    x 2  . Tubulovillous adenoma of colon 12/2009   with HGD  . Urinary tract infection   . Wears hearing aid    left and right     Past Surgical History:  Procedure Laterality Date  . ABDOMINAL HYSTERECTOMY    . ANTERIOR AND POSTERIOR REPAIR N/A 01/17/2015   Procedure: CYSTOCELE REPAIR ;  Surgeon: MariPrincess Bruins;  [facility];  Service: Gynecology;  Laterality: N/A;  . BILATERAL SALPINGOOPHORECTOMY     see BethLaurin Coderfor GYN exams  . BREAST BIOPSY Left 10/18/2013  . BREAST BIOPSY Left 10/31/2013  . BREAST LUMPECTOMY Left 11/05/2013  . BREAST LUMPECTOMY WITH NEEDLE LOCALIZATION AND AXILLARY LYMPH NODE DISSECTION Left 11/05/2013   Procedure: LEFT BREAST LUMPECTOMY WITH NEEDLE LOCALIZATION and axillary lymph Node Dissection;  Surgeon: BenjEdward Jolly;  [facility];  Laterality: Left;  . BREAST SURGERY  11/2013   left lumpectomy  . CATARACT EXTRACTION W/ INTRAOCULAR LENS  IMPLANT, BILATERAL  2012   bilateral  . COLONOSCOPY  11/04/2015   per Dr. StarFuller Planenomatous polyps, no repeats planned   . EYE SURGERY  11/22/2006   cataracts, bilateral, intraocular lens implant  . JOINT REPLACEMENT     2019  . LEFT HEART CATHETERIZATION WITH CORONARY ANGIOGRAM N/A 12/19/2013   Procedure: LEFT HEART CATHETERIZATION WITH CORONARY ANGIOGRAM;  Surgeon: Burnell Blanks, MD;  Location: [facility] CATH LAB;  Service: Cardiovascular;  Laterality: N/A;  . lumbar epidural steroid injection     lumber spinal stenosis  . LUMBAR LAMINECTOMY/DECOMPRESSION MICRODISCECTOMY Right 03/01/2018   Procedure: RIGHT LUMBAR FOUR- LUMBAR FIVE LAMINECTOMY/MICRODISCECTOMY;  Surgeon: [name], MD;  Location: [place];  Service: Neurosurgery;  Laterality: Right;  . POLYPECTOMY    . PORTACATH PLACEMENT Right 12/11/2013   Procedure: INSERTION PORT-A-CATH;  Surgeon: Edward Jolly, MD;   Location: [place];  Service: General;  Laterality: Right;  Subclavian Vein;   . TOTAL KNEE ARTHROPLASTY Right 10/24/2017   Procedure: TOTAL RIGHT KNEE ARTHROPLASTY;  Surgeon: Gaynelle Arabian, MD;  Location: [facility];  Service: Orthopedics;  Laterality: Right;    There were no vitals filed for this visit.  Subjective Assessment - 05/30/18 1629    Subjective  I have been doing the exercises.  A Hot shower helps.  Back feels better. I don't have pain right now.  I was only a little dizzy when i woke this morning.     Currently in Pain?  No/denies    Pain Location  Back    Pain Orientation  Lower;Left;Right    Pain Type  Chronic pain    Pain Frequency  Intermittent    Aggravating Factors   moving the wrong way,    Pain Relieving Factors  stretching anterior hip    Multiple Pain Sites  --   right knee sometimes hurts. (long standing)        [facility] PT Assessment - 05/30/18 0001      Observation/Other Assessments   Focus on Therapeutic Outcomes (FOTO)   44% limitation, 56% ability  improved from intake of 80 % limitation.                   [place] Adult PT Treatment/Exercise - 05/30/18 0001      High Level Balance   High Level Balance Comments  airex balance ex with static narrowed base and dynamic challanges.  Resisted side steps and walking forward  15 feet X 3 each,  side step,  tandem static and walking mild use od hands for balance.       Lumbar Exercises: Standing   Heel Raises  10 reps      Knee/Hip Exercises: Stretches   Gastroc Stretch  3 reps;30 seconds   incline,  wall  HEP     Knee/Hip Exercises: Aerobic   Nustep  5 min L5 UE & LE      Knee/Hip Exercises: Standing   Knee Flexion  1 set;10 reps   each  HEP   Other Standing Knee Exercises  mini squat X 10  after cues    small movements,  monitored for technique            PT Education - 05/30/18 0747    Education Details  HEP    Person(s) Educated  Patient    Methods   Explanation;Demonstration;Verbal cues;Handout    Comprehension  Verbalized understanding;Returned demonstration       PT Short Term Goals - 05/15/18 0945      PT SHORT TERM GOAL #1   Title  Patient to be independent with appropriate HEP, to be updated PRN  Status  Achieved      PT SHORT TERM GOAL #2   Title  Patient to demonstrate improved functional strength as evidenced by ability to peform sit to stand without use of UEs    Status  Achieved      PT SHORT TERM GOAL #3   Title  Patient to score at least 40 on Berg balance test to demonstrate reduced risk of falling and improved functional balance skills     Baseline  43    Status  Achieved      PT SHORT TERM GOAL #4   Title  Patient to be able to ambulate house hold distances with SPC and minimal unsteadiness in order to show improved functional mobility and balance     Status  Achieved        PT Long Term Goals - 05/30/18 1639      PT LONG TERM GOAL #1   Title  Patient to demonstrate MMT as being at least 4/5 in all tested muscle groups in order to show improved functional strength and stability     Baseline  unable to test however functional strength shows imrovement    Time  8    Period  Weeks    Status  Partially Met      PT LONG TERM GOAL #2   Title  Patient to score at least 45/56 on the Berg in order to show improved stability and reduced fall risk     Time  8    Period  Weeks    Status  Unable to assess      PT LONG TERM GOAL #3   Title  Patient to be able to walk household distances without assistive device and will be able to safely ambulate with SPC in the community with mnimal unsteadiness and good safety awareness in order to show improved mobility and safety    Time  8    Period  Weeks    Status  Achieved      PT LONG TERM GOAL #4   Title  Patient to report pain as being no more than 4/10 at worst in order to improve QOL and functional task tolerance     Time  8    Period  Weeks    Status   Achieved      PT LONG TERM GOAL #5   Title  pt be I with all HEP given as of last visit to maintain and progress current level of function    Baseline  independent    Time  8    Period  Weeks    Status  Achieved            Plan - 05/30/18 0919    Clinical Impression Statement  Patient wants this to be her last session.  She is pleased with the outcome.  She will continue with her HEP.  FOTO improved from 80% limitation to 44% limitation ( 56% ability). Unable to test BERG or MMT due to limited time and patient telling me at end of session that this was the last session. All other goals met.    PT Next Visit Plan  D/C to HEP at patient's request.  She is satisfied with the results.    PT Home Exercise Plan  Eval: LAQs with yellow TB, seated clams with yellow TB, standing heel raises; hooklying marching, clams, hamstring curls; SKTC, LTR, piriformis stretch,  hip flexor stretch,  calf stretch,  standing knee flexion.  Consulted and Agree with Plan of Care  Patient       Patient will benefit from skilled therapeutic intervention in order to improve the following deficits and impairments:     Visit Diagnosis: Chronic bilateral low back pain with right-sided sciatica  Muscle weakness (generalized)  Unsteadiness on feet  Difficulty in walking, not elsewhere classified     Problem List Patient Active Problem List   Diagnosis Date Noted  . HNP (herniated nucleus pulposus), lumbar 03/01/2018  . History of total knee replacement, right 11/13/2017  . OA (osteoarthritis) of knee 10/24/2017  . Chronic pain of right knee 06/17/2017  . Osteoporosis 07/30/2016  . Iron deficiency anemia due to chronic blood loss 01/07/2016  . Malabsorption of iron 01/07/2016  . Postoperative state 01/17/2015  . HOCM (hypertrophic obstructive cardiomyopathy) ([place]) 01/09/2014  . Breast cancer ([place]) 01/09/2014  . Coronary atherosclerosis of native coronary artery 12/20/2013  . CAD (coronary artery  disease)   . Hyperlipidemia   . Unstable angina ([place]) 12/19/2013  . Breast cancer of upper-outer quadrant of left female breast ([place]) 11/05/2013  . DCIS (ductal carcinoma in situ) of breast 10/26/2013  . Hypertension 04/05/2012  . Cardiac murmur 04/05/2012  . UNSPECIFIED HEARING LOSS 08/07/2010  . LOW BACK PAIN 04/22/2009  . CONSTIPATION 11/04/2008  . COLONIC POLYPS, HX OF 11/01/2008  . ALLERGIC RHINITIS 06/27/2007  . Hypothyroidism 06/22/2007    Seiji Wiswell PTA 05/30/2018, 4:45 PM  [facility] [address] [place], [place], [address] Phone: [phone]   Fax:  [phone]  Name: Kim [name] MRN: [id number] Date of Birth: Aug 15, 1935  PHYSICAL THERAPY DISCHARGE SUMMARY  Visits from Start of Care: 11  Current functional level related to goals / functional outcomes: See above   Remaining deficits: See above   Education / Equipment: Anatomy of condition, POC, HEP, exercise form/rationale  Plan: Patient agrees to discharge.  Patient goals were partially met. Patient is being discharged due to the patient's request.  ?????     Jessica C. Hightower PT, DPT 05/30/18 5:30 PM

## 2018-06-06 ENCOUNTER — Ambulatory Visit: Payer: Medicare Other | Admitting: Physical Therapy

## 2018-06-12 ENCOUNTER — Other Ambulatory Visit (HOSPITAL_COMMUNITY): Payer: Self-pay | Admitting: Cardiology

## 2018-06-27 ENCOUNTER — Other Ambulatory Visit: Payer: Self-pay | Admitting: *Deleted

## 2018-06-27 ENCOUNTER — Other Ambulatory Visit: Payer: Self-pay

## 2018-06-27 ENCOUNTER — Inpatient Hospital Stay: Payer: Medicare Other

## 2018-06-27 ENCOUNTER — Inpatient Hospital Stay: Payer: Medicare Other | Attending: Hematology & Oncology | Admitting: Hematology & Oncology

## 2018-06-27 ENCOUNTER — Encounter: Payer: Self-pay | Admitting: Hematology & Oncology

## 2018-06-27 VITALS — BP 120/52 | HR 71 | Temp 98.2°F | Resp 19 | Wt 146.0 lb

## 2018-06-27 DIAGNOSIS — Z79899 Other long term (current) drug therapy: Secondary | ICD-10-CM | POA: Diagnosis not present

## 2018-06-27 DIAGNOSIS — D0510 Intraductal carcinoma in situ of unspecified breast: Secondary | ICD-10-CM

## 2018-06-27 DIAGNOSIS — Z7982 Long term (current) use of aspirin: Secondary | ICD-10-CM | POA: Diagnosis not present

## 2018-06-27 DIAGNOSIS — C50412 Malignant neoplasm of upper-outer quadrant of left female breast: Secondary | ICD-10-CM

## 2018-06-27 DIAGNOSIS — Z170 Estrogen receptor positive status [ER+]: Secondary | ICD-10-CM | POA: Diagnosis not present

## 2018-06-27 DIAGNOSIS — M79604 Pain in right leg: Secondary | ICD-10-CM | POA: Diagnosis not present

## 2018-06-27 DIAGNOSIS — Z79811 Long term (current) use of aromatase inhibitors: Secondary | ICD-10-CM | POA: Diagnosis not present

## 2018-06-27 DIAGNOSIS — C779 Secondary and unspecified malignant neoplasm of lymph node, unspecified: Secondary | ICD-10-CM | POA: Insufficient documentation

## 2018-06-27 DIAGNOSIS — D500 Iron deficiency anemia secondary to blood loss (chronic): Secondary | ICD-10-CM | POA: Diagnosis not present

## 2018-06-27 LAB — CBC WITH DIFFERENTIAL (CANCER CENTER ONLY)
Basophils Absolute: 0 10*3/uL (ref 0.0–0.1)
Basophils Relative: 1 %
Eosinophils Absolute: 0.2 10*3/uL (ref 0.0–0.5)
Eosinophils Relative: 4 %
HCT: 41.5 % (ref 34.8–46.6)
Hemoglobin: 13.5 g/dL (ref 11.6–15.9)
Lymphocytes Relative: 14 %
Lymphs Abs: 0.7 10*3/uL — ABNORMAL LOW (ref 0.9–3.3)
MCH: 30.8 pg (ref 26.0–34.0)
MCHC: 32.5 g/dL (ref 32.0–36.0)
MCV: 94.5 fL (ref 81.0–101.0)
Monocytes Absolute: 0.6 10*3/uL (ref 0.1–0.9)
Monocytes Relative: 11 %
Neutro Abs: 3.8 10*3/uL (ref 1.5–6.5)
Neutrophils Relative %: 70 %
Platelet Count: 151 10*3/uL (ref 145–400)
RBC: 4.39 MIL/uL (ref 3.70–5.32)
RDW: 12.8 % (ref 11.1–15.7)
WBC Count: 5.4 10*3/uL (ref 3.9–10.0)

## 2018-06-27 LAB — CMP (CANCER CENTER ONLY)
ALT: 17 U/L (ref 10–47)
AST: 22 U/L (ref 11–38)
Albumin: 4.1 g/dL (ref 3.5–5.0)
Alkaline Phosphatase: 67 U/L (ref 26–84)
Anion gap: 5 (ref 5–15)
BUN: 20 mg/dL (ref 7–22)
CO2: 27 mmol/L (ref 18–33)
Calcium: 9.4 mg/dL (ref 8.0–10.3)
Chloride: 107 mmol/L (ref 98–108)
Creatinine: 0.8 mg/dL (ref 0.60–1.20)
Glucose, Bld: 117 mg/dL (ref 73–118)
Potassium: 4 mmol/L (ref 3.3–4.7)
Sodium: 139 mmol/L (ref 128–145)
Total Bilirubin: 1.1 mg/dL (ref 0.2–1.6)
Total Protein: 7 g/dL (ref 6.4–8.1)

## 2018-06-27 MED ORDER — EXEMESTANE 25 MG PO TABS: 25.0000 mg | ORAL_TABLET | Freq: Every day | ORAL | 3 refills | Status: DC

## 2018-06-27 MED ORDER — DENOSUMAB 60 MG/ML ~~LOC~~ SOSY: 60.0000 mg | PREFILLED_SYRINGE | SUBCUTANEOUS | Status: DC

## 2018-06-27 NOTE — Progress Notes (Addendum)
Hematology and Oncology Follow Up Visit  Kim [name] [id number] [birth year] 83 y.o. 06/27/2018   Principle Diagnosis:  Stage IIA (T1N1M0) adenocarcinoma of the left breast-triple positive Iron deficiency anemia secondary to blood loss  Current Therapy:   Status post cycle 4 of Abraxane/Herceptin Maintenance Herceptin q 3wk - finish 03/25/2015 Prolia 60gm sq every 6 months - due in Jan 2020 Aromasin 25 mg by mouth daily  - start on 01/10/2017  Feraheme as needed-dose given today     Interim History:  [name] is back for followup.  She is not doing well at all.  She is still quite bothered by the fact that she has pain down her right leg.  This happened after she had her back surgery.  She had a herniated disc.  This is at L4-L5.  She was doing great for a couple months and then the pain started again.  She says that the doctor, which I find hard to believe, is really not going to help her.  She is in a lot of discomfort.  She goes back to see him in October.  I told her that maybe she needs to have a myelogram done to see what is going on.  There is been no other issues with her.  She is doing well with the Aromasin.  She has been on Aromasin for a year and a half.  I told her that she probably needs to be on the Aromasin for at least 5 years, if not longer.  She has had no cough or shortness of breath.  She has had no nausea or vomiting.  Medications:  Current Outpatient Medications:  .  aspirin EC 81 MG tablet, Take 81 mg by mouth daily., Disp: , Rfl:  .  Calcium Carbonate-Vitamin D (CALCIUM-D PO), Take 1 tablet by mouth daily., Disp: , Rfl:  .  diclofenac (VOLTAREN) 75 MG EC tablet, Take 75 mg by mouth 2 (two) times daily., Disp: , Rfl:  .  exemestane (AROMASIN) 25 MG tablet, Take 1 tablet (25 mg total) by mouth daily after breakfast., Disp: 30 tablet, Rfl: 6 .  ezetimibe (ZETIA) 10 MG tablet, TAKE 1 TABLET BY MOUTH ONCE DAILY, Disp: 30 tablet, Rfl: 2 .  gabapentin (NEURONTIN)  100 MG capsule, Take 100 mg by mouth 2 (two) times daily., Disp: , Rfl:  .  HYDROcodone-acetaminophen (NORCO/VICODIN) 5-325 MG tablet, Take 1 tablet by mouth every 4 (four) hours as needed (pain)., Disp: 35 tablet, Rfl: 0 .  levothyroxine (SYNTHROID, LEVOTHROID) 112 MCG tablet, TAKE 1 TABLET BY MOUTH ONCE DAILY, Disp: 90 tablet, Rfl: 1 .  methylPREDNISolone (MEDROL DOSEPAK) 4 MG TBPK tablet, Take 4 mg by mouth., Disp: , Rfl:  .  metoprolol succinate (TOPROL-XL) 50 MG 24 hr tablet, TAKE 1 TABLET BY MOUTH 2 TIMES DAILY WITH OR IMMEDIATELY FOLLOWING A MEAL, Disp: 60 tablet, Rfl: 6 .  Multiple Vitamin (MULTIVITAMIN WITH MINERALS) TABS tablet, Take 1 tablet by mouth daily., Disp: , Rfl:  .  traMADol (ULTRAM) 50 MG tablet, Take by mouth every 6 (six) hours as needed., Disp: , Rfl:   Allergies:  Allergies  Allergen Reactions  . Levaquin [Levofloxacin] Hives  . Penicillins Swelling    FACIAL SWELLING PATIENT HAS HAD A PCN REACTION WITH IMMEDIATE RASH, FACIAL/TONGUE/THROAT SWELLING, SOB, OR LIGHTHEADEDNESS WITH HYPOTENSION:  #  #  YES  #  #  Has patient had a PCN reaction causing severe rash involving mucus membranes or skin necrosis: No Has patient  had a PCN reaction that required hospitalization: No Has patient had a PCN reaction occurring within the last 10 years: No  . Propoxyphene N-Acetaminophen Swelling    tongue swelling  . Statins Other (See Comments)    Joint pain  . Cortisone Rash  . Evolocumab Other (See Comments)    Myalgias, fatigue, some itching and burning on her feet,  Arthralgia (Joint Pain)  . Myrbetriq [Mirabegron] Rash    Rash on face  . Pseudoephedrine Other (See Comments)    Facial flushing    Past Medical History, Surgical history, Social history, and Family History were reviewed and updated.  Review of Systems: Review of Systems  Constitutional: Negative.   HENT: Negative.   Eyes: Negative.   Respiratory: Negative.   Cardiovascular: Negative.    Gastrointestinal: Negative.   Genitourinary: Negative.   Musculoskeletal: Positive for back pain and joint pain.  Skin: Negative.   Neurological: Positive for focal weakness.  Endo/Heme/Allergies: Negative.   Psychiatric/Behavioral: Negative.      Physical Exam:  weight is 146 lb (66.2 kg). Her oral temperature is 98.2 F (36.8 C). Her blood pressure is 120/52 (abnormal) and her pulse is 71. Her respiration is 19 and oxygen saturation is 100%.   Physical Exam  Constitutional: She is oriented to person, place, and time.  HENT:  Head: Normocephalic and atraumatic.  Mouth/Throat: Oropharynx is clear and moist.  Eyes: Pupils are equal, round, and reactive to light. EOM are normal.  Neck: Normal range of motion.  Cardiovascular: Normal rate, regular rhythm and normal heart sounds.  Pulmonary/Chest: Effort normal and breath sounds normal.  Abdominal: Soft. Bowel sounds are normal.  Musculoskeletal: Normal range of motion. She exhibits no edema, tenderness or deformity.  Lymphadenopathy:    She has no cervical adenopathy.  Neurological: She is alert and oriented to person, place, and time.  Skin: Skin is warm and dry. No rash noted. No erythema.  Psychiatric: She has a normal mood and affect. Her behavior is normal. Judgment and thought content normal.  Vitals reviewed.   Lab Results  Component Value Date   WBC 5.4 06/27/2018   HGB 13.5 06/27/2018   HCT 41.5 06/27/2018   MCV 94.5 06/27/2018   PLT 151 06/27/2018     Chemistry      Component Value Date/Time   NA 139 06/27/2018 1045   NA 140 06/09/2017 0758   NA 141 10/11/2016 0844   K 4.0 06/27/2018 1045   K 4.5 06/09/2017 0758   K 4.1 10/11/2016 0844   CL 107 06/27/2018 1045   CL 106 06/09/2017 0758   CO2 27 06/27/2018 1045   CO2 28 06/09/2017 0758   CO2 25 10/11/2016 0844   BUN 20 06/27/2018 1045   BUN 17 06/09/2017 0758   BUN 20.3 10/11/2016 0844   CREATININE 0.80 06/27/2018 1045   CREATININE 1.2 06/09/2017 0758    CREATININE 1.1 10/11/2016 0844      Component Value Date/Time   CALCIUM 9.4 06/27/2018 1045   CALCIUM 9.1 06/09/2017 0758   CALCIUM 9.5 10/11/2016 0844   ALKPHOS 67 06/27/2018 1045   ALKPHOS 49 06/09/2017 0758   ALKPHOS 52 10/11/2016 0844   AST 22 06/27/2018 1045   AST 13 10/11/2016 0844   ALT 17 06/27/2018 1045   ALT 19 06/09/2017 0758   ALT 10 10/11/2016 0844   BILITOT 1.1 06/27/2018 1045   BILITOT 0.83 10/11/2016 0844         Impression and Plan: Kim [name] is  83 year-old white female with stage IIA ductal carcinoma of the left breast. She had 4 positive lymph nodes. Her breast cancer was triple positive. She completed her adjuvant chemotherapy. She completed this in July of 2015.  She then went on to complete adjuvant Herceptin in June 2016.  Hopefully, her back will get better.  I am sure that Dr. Sherwood Gambler will do the best that he can to try to help her out.  He is such a compassionate doctor and really goes the "extra mile" to help his patients.  I told her that she is going to be on the Aromasin for at least 5 years.  I will plan to get her back to see [place] in another 4 months.  [name], MD 9/24/201912:53 PM

## 2018-07-11 ENCOUNTER — Ambulatory Visit (INDEPENDENT_AMBULATORY_CARE_PROVIDER_SITE_OTHER): Payer: Medicare Other

## 2018-07-11 DIAGNOSIS — Z23 Encounter for immunization: Secondary | ICD-10-CM | POA: Diagnosis not present

## 2018-07-21 ENCOUNTER — Other Ambulatory Visit: Payer: Self-pay | Admitting: Family Medicine

## 2018-07-31 ENCOUNTER — Encounter: Payer: Self-pay | Admitting: Family Medicine

## 2018-07-31 ENCOUNTER — Ambulatory Visit: Payer: Medicare Other | Admitting: Family Medicine

## 2018-07-31 VITALS — BP 124/60 | HR 57 | Temp 99.0°F | Wt 145.3 lb

## 2018-07-31 DIAGNOSIS — E039 Hypothyroidism, unspecified: Secondary | ICD-10-CM | POA: Diagnosis not present

## 2018-07-31 LAB — T3, FREE: T3, Free: 2 pg/mL — ABNORMAL LOW (ref 2.3–4.2)

## 2018-07-31 LAB — T4, FREE: Free T4: 0.63 ng/dL (ref 0.60–1.60)

## 2018-07-31 LAB — TSH: TSH: 6.41 u[IU]/mL — ABNORMAL HIGH (ref 0.35–4.50)

## 2018-07-31 MED ORDER — LEVOTHYROXINE SODIUM 112 MCG PO TABS: 112.0000 ug | ORAL_TABLET | Freq: Every day | ORAL | 3 refills | Status: DC

## 2018-07-31 NOTE — Progress Notes (Signed)
   Subjective:    Patient ID: Kim [name], female    DOB: [birth year], 83 y.o.   MRN: [id number]  HPI Here to follow up on her thyroid status. She feels well in general except for back pain. Last week she had an epidural steroid injection per Dr. Sherwood Gambler, but she has not gotten much benefit from this. She has lost 22 lbs since January and she says her appetite is down somewhat. She sees Oncology frequently.    Review of Systems  Constitutional: Positive for appetite change and unexpected weight change.  Respiratory: Negative.   Cardiovascular: Negative.   Endocrine: Negative.   Neurological: Negative.        Objective:   Physical Exam  Constitutional: She is oriented to person, place, and time. She appears well-developed and well-nourished.  Neck: No thyromegaly present.  Cardiovascular: Normal rate, regular rhythm, normal heart sounds and intact distal pulses.  Pulmonary/Chest: Effort normal and breath sounds normal.  Lymphadenopathy:    She has no cervical adenopathy.  Neurological: She is alert and oriented to person, place, and time.          Assessment & Plan:  Hypothyroidism, we will check a thyroid panel today.  Alysia Penna, MD

## 2018-08-08 ENCOUNTER — Other Ambulatory Visit: Payer: Self-pay | Admitting: *Deleted

## 2018-08-08 ENCOUNTER — Telehealth: Payer: Self-pay | Admitting: *Deleted

## 2018-08-08 DIAGNOSIS — E039 Hypothyroidism, unspecified: Secondary | ICD-10-CM

## 2018-08-08 MED ORDER — LEVOTHYROXINE SODIUM 137 MCG/ML PO SOLN: 1.0000 | Freq: Every day | ORAL | 3 refills | Status: DC

## 2018-08-08 MED ORDER — LEVOTHYROXINE SODIUM 137 MCG PO TABS: 137.0000 ug | ORAL_TABLET | Freq: Every day | ORAL | 3 refills | Status: DC

## 2018-08-08 NOTE — Telephone Encounter (Signed)
Patient is aware See lab result note 

## 2018-08-08 NOTE — Telephone Encounter (Signed)
Copied from [place] [phone]. Topic: General - Inquiry >> Aug 08, 2018  9:38 AM Reyne Dumas L wrote: Reason for CRM:   Pt calling to get lab results.  Pt states it was a thyroid test done last week. Pt can be reached at [phone]

## 2018-08-15 ENCOUNTER — Other Ambulatory Visit (HOSPITAL_COMMUNITY): Payer: Self-pay | Admitting: Cardiology

## 2018-10-14 ENCOUNTER — Encounter (HOSPITAL_COMMUNITY): Payer: Self-pay

## 2018-10-14 ENCOUNTER — Emergency Department (HOSPITAL_COMMUNITY): Payer: Medicare Other

## 2018-10-14 ENCOUNTER — Inpatient Hospital Stay (HOSPITAL_COMMUNITY)
Admission: EM | Admit: 2018-10-14 | Discharge: 2018-10-18 | DRG: 308 | Disposition: A | Discharge status: Home / Self Care | Payer: Medicare Other | Attending: Cardiology | Admitting: Cardiology

## 2018-10-14 DIAGNOSIS — Z7952 Long term (current) use of systemic steroids: Secondary | ICD-10-CM | POA: Diagnosis not present

## 2018-10-14 DIAGNOSIS — I2510 Atherosclerotic heart disease of native coronary artery without angina pectoris: Secondary | ICD-10-CM | POA: Diagnosis present

## 2018-10-14 DIAGNOSIS — Z79811 Long term (current) use of aromatase inhibitors: Secondary | ICD-10-CM

## 2018-10-14 DIAGNOSIS — Z79899 Other long term (current) drug therapy: Secondary | ICD-10-CM

## 2018-10-14 DIAGNOSIS — I421 Obstructive hypertrophic cardiomyopathy: Secondary | ICD-10-CM | POA: Diagnosis present

## 2018-10-14 DIAGNOSIS — E785 Hyperlipidemia, unspecified: Secondary | ICD-10-CM | POA: Diagnosis present

## 2018-10-14 DIAGNOSIS — Z7982 Long term (current) use of aspirin: Secondary | ICD-10-CM

## 2018-10-14 DIAGNOSIS — D649 Anemia, unspecified: Secondary | ICD-10-CM | POA: Diagnosis present

## 2018-10-14 DIAGNOSIS — I4891 Unspecified atrial fibrillation: Secondary | ICD-10-CM | POA: Diagnosis not present

## 2018-10-14 DIAGNOSIS — E039 Hypothyroidism, unspecified: Secondary | ICD-10-CM | POA: Diagnosis present

## 2018-10-14 DIAGNOSIS — I110 Hypertensive heart disease with heart failure: Secondary | ICD-10-CM | POA: Diagnosis present

## 2018-10-14 DIAGNOSIS — I5033 Acute on chronic diastolic (congestive) heart failure: Secondary | ICD-10-CM | POA: Diagnosis present

## 2018-10-14 DIAGNOSIS — Z853 Personal history of malignant neoplasm of breast: Secondary | ICD-10-CM

## 2018-10-14 DIAGNOSIS — I200 Unstable angina: Secondary | ICD-10-CM | POA: Diagnosis not present

## 2018-10-14 DIAGNOSIS — I340 Nonrheumatic mitral (valve) insufficiency: Secondary | ICD-10-CM | POA: Diagnosis not present

## 2018-10-14 DIAGNOSIS — I4819 Other persistent atrial fibrillation: Secondary | ICD-10-CM | POA: Diagnosis present

## 2018-10-14 DIAGNOSIS — Z90710 Acquired absence of both cervix and uterus: Secondary | ICD-10-CM | POA: Diagnosis not present

## 2018-10-14 DIAGNOSIS — Z9221 Personal history of antineoplastic chemotherapy: Secondary | ICD-10-CM

## 2018-10-14 DIAGNOSIS — Z955 Presence of coronary angioplasty implant and graft: Secondary | ICD-10-CM

## 2018-10-14 DIAGNOSIS — I10 Essential (primary) hypertension: Secondary | ICD-10-CM | POA: Diagnosis present

## 2018-10-14 DIAGNOSIS — Z79890 Hormone replacement therapy (postmenopausal): Secondary | ICD-10-CM | POA: Diagnosis not present

## 2018-10-14 DIAGNOSIS — Z791 Long term (current) use of non-steroidal anti-inflammatories (NSAID): Secondary | ICD-10-CM

## 2018-10-14 DIAGNOSIS — I252 Old myocardial infarction: Secondary | ICD-10-CM | POA: Diagnosis not present

## 2018-10-14 DIAGNOSIS — Z96651 Presence of right artificial knee joint: Secondary | ICD-10-CM | POA: Diagnosis present

## 2018-10-14 DIAGNOSIS — Z923 Personal history of irradiation: Secondary | ICD-10-CM

## 2018-10-14 DIAGNOSIS — N390 Urinary tract infection, site not specified: Secondary | ICD-10-CM | POA: Diagnosis present

## 2018-10-14 HISTORY — DX: Other persistent atrial fibrillation: I48.19

## 2018-10-14 LAB — URINALYSIS, ROUTINE W REFLEX MICROSCOPIC
Bilirubin Urine: NEGATIVE
Glucose, UA: NEGATIVE mg/dL
Ketones, ur: 5 mg/dL — AB
Nitrite: NEGATIVE
Protein, ur: 100 mg/dL — AB
Specific Gravity, Urine: 1.023 (ref 1.005–1.030)
Trans Epithel, UA: 1
pH: 5 (ref 5.0–8.0)

## 2018-10-14 LAB — CBC WITH DIFFERENTIAL/PLATELET
Abs Immature Granulocytes: 0.03 10*3/uL (ref 0.00–0.07)
Basophils Absolute: 0 10*3/uL (ref 0.0–0.1)
Basophils Relative: 1 %
Eosinophils Absolute: 0.1 10*3/uL (ref 0.0–0.5)
Eosinophils Relative: 1 %
HCT: 39.5 % (ref 36.0–46.0)
Hemoglobin: 12.4 g/dL (ref 12.0–15.0)
Immature Granulocytes: 0 %
Lymphocytes Relative: 13 %
Lymphs Abs: 1 10*3/uL (ref 0.7–4.0)
MCH: 30.2 pg (ref 26.0–34.0)
MCHC: 31.4 g/dL (ref 30.0–36.0)
MCV: 96.3 fL (ref 80.0–100.0)
Monocytes Absolute: 0.6 10*3/uL (ref 0.1–1.0)
Monocytes Relative: 8 %
Neutro Abs: 5.6 10*3/uL (ref 1.7–7.7)
Neutrophils Relative %: 77 %
Platelets: 157 10*3/uL (ref 150–400)
RBC: 4.1 MIL/uL (ref 3.87–5.11)
RDW: 14.5 % (ref 11.5–15.5)
WBC: 7.3 10*3/uL (ref 4.0–10.5)
nRBC: 0 % (ref 0.0–0.2)

## 2018-10-14 LAB — COMPREHENSIVE METABOLIC PANEL
ALT: 67 U/L — ABNORMAL HIGH (ref 0–44)
AST: 31 U/L (ref 15–41)
Albumin: 3.7 g/dL (ref 3.5–5.0)
Alkaline Phosphatase: 68 U/L (ref 38–126)
Anion gap: 10 (ref 5–15)
BUN: 23 mg/dL (ref 8–23)
CO2: 19 mmol/L — ABNORMAL LOW (ref 22–32)
Calcium: 8.9 mg/dL (ref 8.9–10.3)
Chloride: 110 mmol/L (ref 98–111)
Creatinine, Ser: 1.15 mg/dL — ABNORMAL HIGH (ref 0.44–1.00)
GFR calc Af Amer: 52 mL/min — ABNORMAL LOW (ref >60)
GFR calc non Af Amer: 45 mL/min — ABNORMAL LOW (ref >60)
Glucose, Bld: 119 mg/dL — ABNORMAL HIGH (ref 70–99)
Potassium: 3.9 mmol/L (ref 3.5–5.1)
Sodium: 139 mmol/L (ref 135–145)
Total Bilirubin: 1.2 mg/dL (ref 0.3–1.2)
Total Protein: 6.1 g/dL — ABNORMAL LOW (ref 6.5–8.1)

## 2018-10-14 LAB — HEMOGLOBIN A1C
Hgb A1c MFr Bld: 5.4 % (ref 4.8–5.6)
Mean Plasma Glucose: 108.28 mg/dL

## 2018-10-14 LAB — TROPONIN I
Troponin I: <0.03 ng/mL (ref <0.03)
Troponin I: <0.03 ng/mL (ref <0.03)

## 2018-10-14 LAB — HEPARIN LEVEL (UNFRACTIONATED): Heparin Unfractionated: 0.2 IU/mL — ABNORMAL LOW (ref 0.30–0.70)

## 2018-10-14 LAB — PROTIME-INR
INR: 1.19
Prothrombin Time: 15 seconds (ref 11.4–15.2)

## 2018-10-14 LAB — I-STAT TROPONIN, ED: Troponin i, poc: 0.01 ng/mL (ref 0.00–0.08)

## 2018-10-14 LAB — TSH: TSH: 1.092 u[IU]/mL (ref 0.350–4.500)

## 2018-10-14 LAB — BRAIN NATRIURETIC PEPTIDE: B Natriuretic Peptide: 814.4 pg/mL — ABNORMAL HIGH (ref 0.0–100.0)

## 2018-10-14 MED ORDER — HEPARIN (PORCINE) 25000 UT/250ML-% IV SOLN
12.0000 [IU]/kg/h | INTRAVENOUS | Status: DC
  Administered 2018-10-14: 12 [IU]/kg/h via INTRAVENOUS
  Filled 2018-10-14: qty 250

## 2018-10-14 MED ORDER — METOPROLOL SUCCINATE ER 50 MG PO TB24
50.0000 mg | ORAL_TABLET | Freq: Every day | ORAL | Status: DC
  Administered 2018-10-14 – 2018-10-18 (×5): 50 mg via ORAL
  Filled 2018-10-14 (×5): qty 1

## 2018-10-14 MED ORDER — HEPARIN BOLUS VIA INFUSION
2000.0000 [IU] | Freq: Once | INTRAVENOUS | Status: AC
  Administered 2018-10-14: 2000 [IU] via INTRAVENOUS
  Filled 2018-10-14: qty 2000

## 2018-10-14 MED ORDER — LEVOTHYROXINE SODIUM 25 MCG PO TABS
137.0000 ug | ORAL_TABLET | Freq: Every day | ORAL | Status: DC
  Administered 2018-10-15 – 2018-10-18 (×4): 137 ug via ORAL
  Filled 2018-10-14 (×4): qty 1

## 2018-10-14 MED ORDER — ZOLPIDEM TARTRATE 5 MG PO TABS: 5.0000 mg | ORAL_TABLET | Freq: Every evening | ORAL | Status: DC | PRN

## 2018-10-14 MED ORDER — DILTIAZEM HCL-DEXTROSE 100-5 MG/100ML-% IV SOLN (PREMIX)
5.0000 mg/h | INTRAVENOUS | Status: DC
  Administered 2018-10-14: 5 mg/h via INTRAVENOUS
  Administered 2018-10-15 – 2018-10-17 (×5): 7.5 mg/h via INTRAVENOUS
  Filled 2018-10-14 (×6): qty 100

## 2018-10-14 MED ORDER — DILTIAZEM LOAD VIA INFUSION
10.0000 mg | Freq: Once | INTRAVENOUS | Status: AC
  Administered 2018-10-14: 10 mg via INTRAVENOUS
  Filled 2018-10-14: qty 10

## 2018-10-14 MED ORDER — ASPIRIN EC 81 MG PO TBEC
81.0000 mg | DELAYED_RELEASE_TABLET | Freq: Every day | ORAL | Status: DC
  Administered 2018-10-15: 81 mg via ORAL
  Filled 2018-10-14 (×2): qty 1

## 2018-10-14 MED ORDER — NITROGLYCERIN 0.4 MG SL SUBL: 0.4000 mg | SUBLINGUAL_TABLET | SUBLINGUAL | Status: DC | PRN

## 2018-10-14 MED ORDER — ONDANSETRON HCL 4 MG/2ML IJ SOLN: 4.0000 mg | Freq: Four times a day (QID) | INTRAMUSCULAR | Status: DC | PRN

## 2018-10-14 MED ORDER — ASPIRIN 300 MG RE SUPP: 300.0000 mg | RECTAL | Status: AC

## 2018-10-14 MED ORDER — GABAPENTIN 100 MG PO CAPS
100.0000 mg | ORAL_CAPSULE | Freq: Two times a day (BID) | ORAL | Status: DC
  Administered 2018-10-14 – 2018-10-18 (×9): 100 mg via ORAL
  Filled 2018-10-14 (×9): qty 1

## 2018-10-14 MED ORDER — ASPIRIN 81 MG PO CHEW
324.0000 mg | CHEWABLE_TABLET | ORAL | Status: AC
  Administered 2018-10-14: 324 mg via ORAL
  Filled 2018-10-14: qty 4

## 2018-10-14 MED ORDER — EXEMESTANE 25 MG PO TABS
25.0000 mg | ORAL_TABLET | Freq: Every day | ORAL | Status: DC
  Administered 2018-10-15 – 2018-10-18 (×4): 25 mg via ORAL
  Filled 2018-10-14 (×4): qty 1

## 2018-10-14 MED ORDER — HYDROCODONE-ACETAMINOPHEN 5-325 MG PO TABS: 1.0000 | ORAL_TABLET | ORAL | Status: DC | PRN

## 2018-10-14 MED ORDER — SULFAMETHOXAZOLE-TRIMETHOPRIM 400-80 MG PO TABS
1.0000 | ORAL_TABLET | Freq: Two times a day (BID) | ORAL | Status: AC
  Administered 2018-10-14 – 2018-10-17 (×7): 1 via ORAL
  Filled 2018-10-14 (×7): qty 1

## 2018-10-14 MED ORDER — ADULT MULTIVITAMIN W/MINERALS CH
1.0000 | ORAL_TABLET | Freq: Every day | ORAL | Status: DC
  Administered 2018-10-14 – 2018-10-18 (×5): 1 via ORAL
  Filled 2018-10-14 (×5): qty 1

## 2018-10-14 MED ORDER — ACETAMINOPHEN 325 MG PO TABS: 650.0000 mg | ORAL_TABLET | ORAL | Status: DC | PRN

## 2018-10-14 MED ORDER — HEPARIN (PORCINE) 25000 UT/250ML-% IV SOLN: 900.0000 [IU]/h | INTRAVENOUS | Status: DC

## 2018-10-14 MED ORDER — ALPRAZOLAM 0.25 MG PO TABS: 0.2500 mg | ORAL_TABLET | Freq: Two times a day (BID) | ORAL | Status: DC | PRN

## 2018-10-14 MED ORDER — EZETIMIBE 10 MG PO TABS
10.0000 mg | ORAL_TABLET | Freq: Every day | ORAL | Status: DC
  Administered 2018-10-15 – 2018-10-18 (×4): 10 mg via ORAL
  Filled 2018-10-14 (×4): qty 1

## 2018-10-14 NOTE — ED Notes (Signed)
Cardiology at bedside.

## 2018-10-14 NOTE — H&P (Addendum)
Cardiology Admission History and Physical:   Patient ID: Kim [name]; MRN: [id number]; DOB: 1935/06/21   Admission date: 10/14/2018  Primary Care Provider: Laurey Morale, MD Primary Cardiologist: [name], MD 05/05/2017 Primary Electrophysiologist:  None  Chief Complaint:  Chest pain  Patient Profile:   Kim [name] is a 84 y.o. female with a history of DES RCA 2015, HOCM, HTN, HLD intol statins and Repatha, hypothyroid, breast CA on chemo s/p XRT, HOH.   History of Present Illness:   Kim [name] has been complaining of feelings of being lightheaded and dizzy for over a month.  The symptoms have worsened in the last few days.  She has felt fatigued and has not been doing as much housework as usual.  She denies lower extremity edema, orthopnea or PND up until last night.  Last p.m., she developed some shortness of breath that worsened when she tried to go to bed.  She was not able to sleep all night long because of the shortness of breath.  Then she developed chest pain.  The chest pain was in her upper and left chest and radiated down her left arm.  It was a tingling in her chest, 4/10.  However, the arm pain was worse, reaching a 7/10.  She took 2 nitroglycerin and was able to sleep for about 30 minutes.  Then, the pain woke her up.  She took a total of 6 nitroglycerin throughout the night, 2 at a time.  This a.m., she decided to come to the emergency room.  In the emergency room, initial cardiac enzymes are negative.  However, she is in rapid atrial fibrillation.  She has no awareness of the atrial fibrillation.  She has not checked her blood pressure in a long time, because she could not find her blood pressure cuff.  She has no history of palpitations, presyncope or syncope.  She has been struggling with being lightheaded at times and weak, not necessarily orthostatic in nature.  In the emergency room, her heart rate is as high as the 140s at rest.  However, she is not  aware of it.  She is not short of breath at rest.  She is not currently having any chest pain.  She has not had any medication since coming to the emergency room.   Past Medical History:  Diagnosis Date  . Allergic rhinitis   . Allergy   . Arthritis    hands  . Breast cancer of upper-outer quadrant of left female breast ([place]) 11/08/13   finished chemo and radiation 2015  . CAD (coronary artery disease)    a. 12/2013 Cath/PCI: LM nl, LAD 20p, 78m, 30d, D1 30p, LCX 20p, 91m, OM1 20, Mo2 40p, RCA 30p, 39m (4.0x16 Promus Premier DES), 20d, RPL 30, RPDA 70p, 36m, EF 65-70%.  . Diverticulosis   . HOH (hard of hearing)    bilateral, wears hearing aids  . Hx of adenomatous colonic polyps 02/1999  . Hyperlipidemia   . Hypertension   . Hypertrophic obstructive cardiomyopathy (HOCM) [facility])    Cardiologist is Dr. [name]  . Hypothyroidism   . Iron deficiency anemia due to chronic blood loss 01/07/2016  . Low back pain    gets ESI from Dr. Rennis Harding  . Malabsorption of iron 01/07/2016  . Myocardial infarction ([place]) 12/2013   very mild-with first chemo -placed stent x1  . Persistent atrial fibrillation with rapid ventricular response 10/14/2018  . Personal history of radiation therapy 2015  .  S/P radiation therapy 05/14/2014-06/26/2014   1) Left Breast  / 50 Gy in 25 fractions, 2) Left Supraclavicular fossa/ 47.5 Gy in 25 fractions, 3) Left Posterior Axillary boost / 4.825 Gy in 25 fractions, 4) Left Breast boost / 10 Gy in 5 fractions   . SVD (spontaneous vaginal delivery)    x 2  . Tubulovillous adenoma of colon 12/2009   with HGD  . Urinary tract infection   . Wears hearing aid    left and right     Past Surgical History:  Procedure Laterality Date  . ABDOMINAL HYSTERECTOMY    . ANTERIOR AND POSTERIOR REPAIR N/A 01/17/2015   Procedure: CYSTOCELE REPAIR ;  Surgeon: Princess Bruins, MD;  Location: [place] [facility];  Service: Gynecology;  Laterality: N/A;  . BILATERAL SALPINGOOPHORECTOMY     see Laurin Coder NP for GYN exams  . BREAST BIOPSY Left 10/18/2013  . BREAST BIOPSY Left 10/31/2013  . BREAST LUMPECTOMY Left 11/05/2013  . BREAST LUMPECTOMY WITH NEEDLE LOCALIZATION AND AXILLARY LYMPH NODE DISSECTION Left 11/05/2013   Procedure: LEFT BREAST LUMPECTOMY WITH NEEDLE LOCALIZATION and axillary lymph Node Dissection;  Surgeon: Edward Jolly, MD;  Location: [place];  Service: General;  Laterality: Left;  . BREAST SURGERY  11/2013   left lumpectomy  . CATARACT EXTRACTION W/ INTRAOCULAR LENS  IMPLANT, BILATERAL  2012   bilateral  . COLONOSCOPY  11/04/2015   per Dr. Fuller Plan, adenomatous polyps, no repeats planned   . EYE SURGERY  11/22/2006   cataracts, bilateral, intraocular lens implant  . JOINT REPLACEMENT     2019  . LEFT HEART CATHETERIZATION WITH CORONARY ANGIOGRAM N/A 12/19/2013   Procedure: LEFT HEART CATHETERIZATION WITH CORONARY ANGIOGRAM;  Surgeon: Burnell Blanks, MD;  Location: [facility] CATH LAB;  Service: Cardiovascular;  Laterality: N/A;  . lumbar epidural steroid injection     lumber spinal stenosis  . LUMBAR LAMINECTOMY/DECOMPRESSION MICRODISCECTOMY Right 03/01/2018   Procedure: RIGHT LUMBAR FOUR- LUMBAR FIVE LAMINECTOMY/MICRODISCECTOMY;  Surgeon: [name], MD;  Location: [place];  Service: Neurosurgery;  Laterality: Right;  . POLYPECTOMY    . PORTACATH PLACEMENT Right 12/11/2013   Procedure: INSERTION PORT-A-CATH;  Surgeon: Edward Jolly, MD;  Location: [place];  Service: General;  Laterality: Right;  Subclavian Vein;   . TOTAL KNEE ARTHROPLASTY Right 10/24/2017   Procedure: TOTAL RIGHT KNEE ARTHROPLASTY;  Surgeon: Gaynelle Arabian, MD;  Location: [facility];  Service: Orthopedics;  Laterality: Right;     Medications Prior to Admission: Prior to Admission medications   Medication Sig Start Date End Date Taking? Authorizing Provider  aspirin EC 81 MG tablet Take 81 mg by mouth daily.   Yes [provider]  Calcium  Carbonate-Vitamin D (CALCIUM-D PO) Take 1 tablet by mouth daily.   Yes [provider]  diclofenac (VOLTAREN) 75 MG EC tablet Take 75 mg by mouth 2 (two) times daily.   Yes [provider]  exemestane (AROMASIN) 25 MG tablet Take 1 tablet (25 mg total) by mouth daily after breakfast. 06/27/18  Yes Ennever, Rudell Cobb, MD  ezetimibe (ZETIA) 10 MG tablet TAKE 1 TABLET BY MOUTH ONCE DAILY 08/15/18  Yes Larey Dresser, MD  gabapentin (NEURONTIN) 100 MG capsule Take 100 mg by mouth 2 (two) times daily. 03/17/18  Yes [provider]  HYDROcodone-acetaminophen (NORCO/VICODIN) 5-325 MG tablet Take 1 tablet by mouth every 4 (four) hours as needed (pain). 03/02/18  Yes [name], MD  levothyroxine (SYNTHROID, LEVOTHROID) 137 MCG tablet  Take 1 tablet (137 mcg total) by mouth daily before breakfast. 08/08/18  Yes Laurey Morale, MD  metoprolol succinate (TOPROL-XL) 50 MG 24 hr tablet TAKE 1 TABLET BY MOUTH 2 TIMES DAILY WITH OR IMMEDIATELY FOLLOWING A MEAL 03/20/18  Yes Larey Dresser, MD  Multiple Vitamin (MULTIVITAMIN WITH MINERALS) TABS tablet Take 1 tablet by mouth daily.   Yes [provider]  methylPREDNISolone (MEDROL DOSEPAK) 4 MG TBPK tablet Take 4 mg by mouth. 03/21/18   [provider]  ferrous sulfate 325 (65 FE) MG tablet Take 325 mg by mouth 2 (two) times daily.    12/21/11  [provider]  pravastatin (PRAVACHOL) 80 MG tablet Take 1 tablet (80 mg total) by mouth every evening. 11/20/15 04/08/16  Larey Dresser, MD     Allergies:    Allergies  Allergen Reactions  . Levaquin [Levofloxacin] Hives  . Penicillins Swelling    FACIAL SWELLING PATIENT HAS HAD A PCN REACTION WITH IMMEDIATE RASH, FACIAL/TONGUE/THROAT SWELLING, SOB, OR LIGHTHEADEDNESS WITH HYPOTENSION:  #  #  YES  #  #  Has patient had a PCN reaction causing severe rash involving mucus membranes or skin necrosis: No Has patient had a PCN reaction that required hospitalization:  No Has patient had a PCN reaction occurring within the last 10 years: No  . Propoxyphene N-Acetaminophen Swelling    tongue swelling  . Statins Other (See Comments)    Joint pain  . Cortisone Rash  . Evolocumab Other (See Comments)    Myalgias, fatigue, some itching and burning on her feet,  Arthralgia (Joint Pain)  . Myrbetriq [Mirabegron] Rash    Rash on face  . Pseudoephedrine Other (See Comments)    Facial flushing    Social History:   Social History   Socioeconomic History  . Marital status: Married    Spouse name: Not on file  . Number of children: 2  . Years of education: Not on file  . Highest education level: Not on file  Occupational History  . Occupation: Retired-school system  Social Needs  . Financial resource strain: Not on file  . Food insecurity:    Worry: Not on file    Inability: Not on file  . Transportation needs:    Medical: Not on file    Non-medical: Not on file  Tobacco Use  . Smoking status: Never Smoker  . Smokeless tobacco: Never Used  . Tobacco comment: never used tobacco  Substance and Sexual Activity  . Alcohol use: No    Alcohol/week: 0.0 standard drinks  . Drug use: No  . Sexual activity: Not Currently    Birth control/protection: Post-menopausal  Lifestyle  . Physical activity:    Days per week: Not on file    Minutes per session: Not on file  . Stress: Not on file  Relationships  . Social connections:    Talks on phone: Not on file    Gets together: Not on file    Attends religious service: Not on file    Active member of club or organization: Not on file    Attends meetings of clubs or organizations: Not on file    Relationship status: Not on file  . Intimate partner violence:    Fear of current or ex partner: Not on file    Emotionally abused: Not on file    Physically abused: Not on file    Forced sexual activity: Not on file  Other Topics Concern  . Not on  file  Social History Narrative  . Not on file    Family  History:   The patient's family history includes Alcohol abuse in her father; Kidney failure in her mother. There is no history of Colon cancer, Rectal cancer, Stomach cancer, or Colon polyps.   The patient She indicated that the status of her mother is unknown. She indicated that the status of her father is unknown. She indicated that her sister is alive. She indicated that her maternal grandmother is deceased. She indicated that her maternal grandfather is deceased. She indicated that her paternal grandmother is deceased. She indicated that her paternal grandfather is deceased. She indicated that the status of her neg hx is unknown.   ROS:  Please see the history of present illness.  All other ROS reviewed and negative.     Physical Exam/Data:   Vitals:   10/14/18 1200 10/14/18 1245 10/14/18 1300 10/14/18 1330  BP: (!) 157/124 (!) 162/109 (!) 153/121 (!) 161/124  Pulse: (!) 128 (!) 140 (!) 132 (!) 118  Resp: (!) 26 (!) 22 18 (!) 22  Temp:      TempSrc:      SpO2: 94% 95% 97% 97%  Weight:      Height:       No intake or output data in the 24 hours ending 10/14/18 1359 Filed Weights   10/14/18 0951  Weight: 63.5 kg   Body mass index is 21.29 kg/m.  General:  Well nourished, well developed, in no acute distress at rest HEENT: normal Lymph: no adenopathy Neck:  JVD elevated, 9-10 cm Endocrine:  No thryomegaly Vascular: No carotid bruits; FA pulses 2+ bilaterally  Cardiac:  normal S1, S2; rapid and irregular rate and rhythm, + murmur, no rub or gallop  Lungs: Rales bases, left greater than right bilaterally, no wheezing, rhonchi  Abd: soft, nontender, no hepatomegaly  Ext: No edema Musculoskeletal:  No deformities, BUE and BLE strength normal and equal Skin: warm and dry  Neuro:  CNs 2-12 intact, no focal abnormalities noted Psych:  Normal affect    EKG:  The ECG that was done 10/14/2018 was personally reviewed and demonstrates atrial fib, rapid ventricular response, possible  LVH, heart rate 135.  Minor morphology changes from 05/2017  Relevant CV Studies:  ECHO: 12/29/2016 - Left ventricle: Turbulence in LVOT SAM hard to appreciate small   LVOT gradient less than 37m/sec. Severe asymetric septal   hypertophy consisant with HOCM. The cavity size was normal.   Systolic function was normal. The estimated ejection fraction was   in the range of 55% to 60%. Left ventricular diastolic function   parameters were normal. - Mitral valve: There was mild regurgitation. - Left atrium: The atrium was moderately to severely dilated. - Atrial septum: No defect or patent foramen ovale was identified. - Pulmonary arteries: PA peak pressure: 32 mm Hg (S). - Pericardium, extracardiac: A trivial pericardial effusion was   identified.   CATH: 12/19/2013 Left main: No obstructive disease.   Left Anterior Descending Artery:  Large caliber vessel that courses to the apex. The proximal vessel has diffuse 20% stenosis. The mid vessel has diffuse 50% stenosis after the takeoff of the diagonal branch. The distal vessel has diffuse 30% stenosis. The diagonal branch is moderate in caliber with 30% proximal stenosis.   Circumflex Artery: Large caliber vessel with 20% proximal stenosis, 30% mid stenosis after the takeoff of the two obtuse marginal branches. The first OM branch is moderate in caliber, tortuous with  20% stenosis. The second OM branch is moderate in caliber with 40% proximal stenosis.   Right Coronary Artery: Large dominant vessel with 30% proximal stenosis, 99% mid stenosis, 20% distal stenosis. The posterolateral branch is moderate in caliber with 30% stenosis. The PDA is moderate in caliber with proximal 70% stenosis, mid 50% stenosis.   Left Ventricular Angiogram: LVEF=65-70%  Impression: 1. Severe stenosis mid RCA 2. Unstable angina 3. Successful PTCA/DES x 1 mid RCA 4. Moderate disease in LAD, Circumflex, PDA 5. Normal LV systolic function  Laboratory  Data:  Chemistry Recent Labs  Lab 10/14/18 0950  NA 139  K 3.9  CL 110  CO2 19*  GLUCOSE 119*  BUN 23  CREATININE 1.15*  CALCIUM 8.9  GFRNONAA 45*  GFRAA 52*  ANIONGAP 10    Recent Labs  Lab 10/14/18 0950  PROT 6.1*  ALBUMIN 3.7  AST 31  ALT 67*  ALKPHOS 68  BILITOT 1.2   Hematology Recent Labs  Lab 10/14/18 0950  WBC 7.3  RBC 4.10  HGB 12.4  HCT 39.5  MCV 96.3  MCH 30.2  MCHC 31.4  RDW 14.5  PLT 157   Cardiac EnzymesNo results for input(s): TROPONINI in the last 168 hours.  Recent Labs  Lab 10/14/18 0955  TROPIPOC 0.01    BNP Recent Labs  Lab 10/14/18 0950  BNP 814.4*    Radiology/Studies:  Dg Chest Portable 1 View  Result Date: 10/14/2018 CLINICAL DATA:  Back pain between her shoulder blades. EXAM: PORTABLE CHEST 1 VIEW COMPARISON:  Chest x-ray dated August 24, 2017. FINDINGS: Stable cardiomegaly. Normal pulmonary vascularity. Patchy opacity in the left lower lobe silhouetting the left hemidiaphragm. No pleural effusion or pneumothorax. No acute osseous abnormality. IMPRESSION: 1. Left lower lobe atelectasis versus infiltrate. Electronically Signed   By: Titus Dubin M.D.   On: 10/14/2018 10:17    Assessment and Plan:   Principal Problem: 1.  Unstable angina (HCC) -Admit, rule out MI, continue ASA, add heparin -Continue beta-blocker.  Not on statin due to side effects - Chest pain may be related to elevated heart rate.  However, she will need an ischemic evaluation. -Cardiac enzymes remain negative, discuss stress test versus cath with MD  Active Problems: 2.  Persistent atrial fibrillation with rapid ventricular response -New diagnosis -Heart rate is uncontrolled - Continue home dose of metoprolol 50 mg and add IV Cardizem to help with rate control - Add heparin until the need for invasive work-up is completed, then start DOAC - CHA2DS2-VASc =  5 (HTN, CAD, age x 2, female)  3.  Hypertension -She does not know what her blood  pressure runs at home, has not taken it in months. -Continue home medications plus the Cardizem  4.  HOCM (hypertrophic obstructive cardiomyopathy) ([place]) - Although her BNP is elevated, and neck veins are up, chest x-ray showed possible pneumonia, but no CHF. - Discuss the need for diuresis with MD  5.  Abnormal chest x-ray: -See report above, possible infiltrate - White count is within normal limits, no fever or chills, no cough. - Hold off on antibiotics at this time, monitor for symptoms.   Severity of Illness: The appropriate patient status for this patient is INPATIENT. Inpatient status is judged to be reasonable and necessary in order to provide the required intensity of service to ensure the patient's safety. The patient's presenting symptoms, physical exam findings, and initial radiographic and laboratory data in the context of their chronic comorbidities is felt to place them  at high risk for further clinical deterioration. Furthermore, it is not anticipated that the patient will be medically stable for discharge from the hospital within 2 midnights of admission. The following factors support the patient status of inpatient.   " The patient's presenting symptoms include chest pain. " The worrisome physical exam findings include rapid heartbeat. " The initial radiographic and laboratory data are worrisome because of atrial fib. " The chronic co-morbidities include hypertension.   * I certify that at the point of admission it is my clinical judgment that the patient will require inpatient hospital care spanning beyond 2 midnights from the point of admission due to high intensity of service, high risk for further deterioration and high frequency of surveillance required.*    For questions or updates, please contact [place] Please consult [email or web address] for contact info under Cardiology/STEMI.    Signed, Rosaria Ferries, PA-C  10/14/2018 1:59 PM   History and all data above  reviewed.  Patient examined.  I agree with the findings as above.  The patient reports that she has been having symptoms of fatigue, vague chest pain and SOB for about 3 or 4 days.  She would not notice tachy palpitations.  Found to be in new onset atrial fib.  She also feels like she has a UTI as there is a foul smell and concentrated urine.  She is found to have probable UTI in the ED.  The patient exam reveals TGY:BWLSLHTDS, systolic murmur increased with the strain phase of Valsalva.   ,  Lungs: Clear  ,  Abd: Positive bowel sounds, no rebound no guarding, Ext No edema  .  All available labs, radiology testing, previous records reviewed. Agree with documented assessment and plan. ATRIAL FIB:  Duration of this is greater than 48 hours likely.  Now on heparin.  Likely can transition to [place] in the AM.  Transition to PO Cardizem in the morning.  TEE/DCCV if we cannot control the rate.  UTI:  Will treat.  ACUTE ON CHRONIC DIASTOLIC HF:  I think this is more related to atrial fib and will defer reflexive diuresis.   Jeneen Rinks Levent Kornegay  4:38 PM  10/14/2018

## 2018-10-14 NOTE — ED Notes (Addendum)
Pt given beverage per cardiology Suanne Marker, [place])

## 2018-10-14 NOTE — Plan of Care (Signed)
  Problem: Clinical Measurements: Goal: Respiratory complications will improve Outcome: Progressing Note:  No s/s of respiratory complications noted. Goal: Cardiovascular complication will be avoided Outcome: Progressing Note:  A-fib with HR controlled on Diltiazem gtt.   Problem: Coping: Goal: Level of anxiety will decrease Outcome: Progressing Note:  No s/s of anxiety noted.   Problem: Elimination: Goal: Will not experience complications related to urinary retention Outcome: Progressing Note:  No s/s of urinary retention noted.  Voiding without difficulty in [facility].

## 2018-10-14 NOTE — ED Notes (Addendum)
Pt aware that urine sample is needed, but is unable to provide one at this time 

## 2018-10-14 NOTE — Progress Notes (Signed)
ANTICOAGULATION CONSULT NOTE - Follow Up Consult  Pharmacy Consult for heparin Indication: Afib and USAP  Labs: Recent Labs    10/14/18 0950 10/14/18 1545 10/14/18 1753 10/14/18 2251  HGB 12.4  --   --   --   HCT 39.5  --   --   --   PLT 157  --   --   --   LABPROT  --  15.0  --   --   INR  --  1.19  --   --   HEPARINUNFRC  --   --   --  0.20*  CREATININE 1.15*  --   --   --   TROPONINI  --   --  <0.03  --     Assessment: 84yo female subtherapeutic on heparin with initial dosing for new Afib and USAP; no gtt issues or signs of bleeding per RN.  Goal of Therapy:  Heparin level 0.3-0.7 units/ml   Plan:  Will increase heparin gtt by 2 units/kg/hr to 900 units/hr and check level in 8 hours.    Wynona Neat, PharmD, BCPS  10/14/2018,11:48 PM

## 2018-10-14 NOTE — ED Triage Notes (Signed)
Pt c/o CP, numbness/tingling in L arm, and sob that began last night; hx stent placement 2015; denies CP at this time, still endorses some SOB and some numbness in L fingers; states she has been "feeling bad for about 3 days"

## 2018-10-14 NOTE — Progress Notes (Addendum)
ANTICOAGULATION CONSULT NOTE - Initial Consult  Pharmacy Consult for heparin Indication: chest pain/ACS  Allergies  Allergen Reactions  . Levaquin [Levofloxacin] Hives  . Penicillins Swelling    FACIAL SWELLING PATIENT HAS HAD A PCN REACTION WITH IMMEDIATE RASH, FACIAL/TONGUE/THROAT SWELLING, SOB, OR LIGHTHEADEDNESS WITH HYPOTENSION:  #  #  YES  #  #  Has patient had a PCN reaction causing severe rash involving mucus membranes or skin necrosis: No Has patient had a PCN reaction that required hospitalization: No Has patient had a PCN reaction occurring within the last 10 years: No  . Propoxyphene N-Acetaminophen Swelling    tongue swelling  . Statins Other (See Comments)    Joint pain  . Cortisone Rash  . Evolocumab Other (See Comments)    Myalgias, fatigue, some itching and burning on her feet,  Arthralgia (Joint Pain)  . Myrbetriq [Mirabegron] Rash    Rash on face  . Pseudoephedrine Other (See Comments)    Facial flushing    Patient Measurements: Height: 5\' 8"  (172.7 cm) Weight: 140 lb (63.5 kg) IBW/kg (Calculated) : 63.9 HEPARIN DW (KG): 63.5  Vital Signs: Temp: 98.6 F (37 C) (01/11 0947) Temp Source: Oral (01/11 0947) BP: 168/120 (01/11 1415) Pulse Rate: 133 (01/11 1415)  Labs: Recent Labs    10/14/18 0950  HGB 12.4  HCT 39.5  PLT 157  CREATININE 1.15*    Estimated Creatinine Clearance: 38.5 mL/min (A) (by C-G formula based on SCr of 1.15 mg/dL (H)).   Medical History: Past Medical History:  Diagnosis Date  . Allergic rhinitis   . Allergy   . Arthritis    hands  . Breast cancer of upper-outer quadrant of left female breast ([place]) 11/08/13   finished chemo and radiation 2015  . CAD (coronary artery disease)    a. 12/2013 Cath/PCI: LM nl, LAD 20p, 83m, 30d, D1 30p, LCX 20p, 75m, OM1 20, Mo2 40p, RCA 30p, 27m (4.0x16 Promus Premier DES), 20d, RPL 30, RPDA 70p, 95m, EF 65-70%.  . Diverticulosis   . HOH (hard of hearing)    bilateral, wears hearing aids   . Hx of adenomatous colonic polyps 02/1999  . Hyperlipidemia   . Hypertension   . Hypertrophic obstructive cardiomyopathy (HOCM) [facility])    Cardiologist is Dr. [name]  . Hypothyroidism   . Iron deficiency anemia due to chronic blood loss 01/07/2016  . Low back pain    gets ESI from Dr. Rennis Harding  . Malabsorption of iron 01/07/2016  . Myocardial infarction ([place]) 12/2013   very mild-with first chemo -placed stent x1  . Persistent atrial fibrillation with rapid ventricular response 10/14/2018  . Personal history of radiation therapy 2015  . S/P radiation therapy 05/14/2014-06/26/2014   1) Left Breast  / 50 Gy in 25 fractions, 2) Left Supraclavicular fossa/ 47.5 Gy in 25 fractions, 3) Left Posterior Axillary boost / 4.825 Gy in 25 fractions, 4) Left Breast boost / 10 Gy in 5 fractions   . SVD (spontaneous vaginal delivery)    x 2  . Tubulovillous adenoma of colon 12/2009   with HGD  . Urinary tract infection   . Wears hearing aid    left and right    Assessment: Kim [name] is an 84yo female admitted to [facility] with chest pain and now new onset afib. Pharmacy consulted to start heparin infusion. No anticoagulation PTA and CBC stable.   Goal of Therapy:  Heparin level 0.3-0.7 units/ml Monitor platelets by anticoagulation protocol: Yes   Plan:  Bolus 2000 units x1 Start heparin 750 units/hr Heparin level at 2300 tonight Heparin levels daily, CBC daily, monitor for s/sx of bleeding F/u plans for long term anticoag after heparin given new afib  Thank you for involving pharmacy in this patient's care.  Janae Bridgeman, PharmD PGY1 Pharmacy Resident Phone: [phone] 10/14/2018 3:02 PM

## 2018-10-14 NOTE — ED Provider Notes (Signed)
[place] 6E PROGRESSIVE CARE Provider Note   CSN: [id number] Arrival date & time: 10/14/18  0930     History   Chief Complaint Chief Complaint  Patient presents with  . Chest Pain    HPI Kim [name] is a 84 y.o. female.  The history is provided by the patient.  Weakness  Severity:  Mild Onset quality:  Gradual Timing:  Intermittent Progression:  Waxing and waning Chronicity:  New Context: not dehydration   Relieved by:  Nothing Worsened by:  Nothing Associated symptoms: chest pain and lethargy   Associated symptoms: no abdominal pain, no arthralgias, no cough, no difficulty walking, no dizziness, no dysuria, no fever, no loss of consciousness, no seizures, no shortness of breath, no stroke symptoms, no vision change and no vomiting   Risk factors: coronary artery disease   Risk factors: no new medications     Past Medical History:  Diagnosis Date  . Allergic rhinitis   . Allergy   . Arthritis    hands  . Breast cancer of upper-outer quadrant of left female breast ([place]) 11/08/13   finished chemo and radiation 2015  . CAD (coronary artery disease)    a. 12/2013 Cath/PCI: LM nl, LAD 20p, 50m, 30d, D1 30p, LCX 20p, 40m, OM1 20, Mo2 40p, RCA 30p, 21m (4.0x16 Promus Premier DES), 20d, RPL 30, RPDA 70p, 50m, EF 65-70%.  . Diverticulosis   . HOH (hard of hearing)    bilateral, wears hearing aids  . Hx of adenomatous colonic polyps 02/1999  . Hyperlipidemia   . Hypertension   . Hypertrophic obstructive cardiomyopathy (HOCM) [facility])    Cardiologist is Dr. [name]  . Hypothyroidism   . Iron deficiency anemia due to chronic blood loss 01/07/2016  . Low back pain    gets ESI from Dr. Rennis Harding  . Malabsorption of iron 01/07/2016  . Myocardial infarction ([place]) 12/2013   very mild-with first chemo -placed stent x1  . Persistent atrial fibrillation with rapid ventricular response 10/14/2018  . Personal history of radiation therapy 2015  . S/P radiation therapy  05/14/2014-06/26/2014   1) Left Breast  / 50 Gy in 25 fractions, 2) Left Supraclavicular fossa/ 47.5 Gy in 25 fractions, 3) Left Posterior Axillary boost / 4.825 Gy in 25 fractions, 4) Left Breast boost / 10 Gy in 5 fractions   . Tubulovillous adenoma of colon 12/2009   with HGD  . Wears hearing aid    left and right     Patient Active Problem List   Diagnosis Date Noted  . Persistent atrial fibrillation with rapid ventricular response 10/14/2018  . HNP (herniated nucleus pulposus), lumbar 03/01/2018  . History of total knee replacement, right 11/13/2017  . OA (osteoarthritis) of knee 10/24/2017  . Chronic pain of right knee 06/17/2017  . Osteoporosis 07/30/2016  . Iron deficiency anemia due to chronic blood loss 01/07/2016  . Malabsorption of iron 01/07/2016  . Postoperative state 01/17/2015  . HOCM (hypertrophic obstructive cardiomyopathy) ([place]) 01/09/2014  . Breast cancer ([place]) 01/09/2014  . Coronary atherosclerosis of native coronary artery 12/20/2013  . CAD (coronary artery disease)   . Hyperlipidemia   . Unstable angina ([place]) 12/19/2013  . Breast cancer of upper-outer quadrant of left female breast ([place]) 11/05/2013  . DCIS (ductal carcinoma in situ) of breast 10/26/2013  . Hypertension 04/05/2012  . Cardiac murmur 04/05/2012  . UNSPECIFIED HEARING LOSS 08/07/2010  . LOW BACK PAIN 04/22/2009  . CONSTIPATION 11/04/2008  . COLONIC POLYPS, HX  OF 11/01/2008  . ALLERGIC RHINITIS 06/27/2007  . Hypothyroidism 06/22/2007    Past Surgical History:  Procedure Laterality Date  . ABDOMINAL HYSTERECTOMY    . ANTERIOR AND POSTERIOR REPAIR N/A 01/17/2015   Procedure: CYSTOCELE REPAIR ;  Surgeon: Princess Bruins, MD;  Location: [facility];  Service: Gynecology;  Laterality: N/A;  . BILATERAL SALPINGOOPHORECTOMY     see Laurin Coder NP for GYN exams  . BREAST BIOPSY Left 10/18/2013  . BREAST BIOPSY Left 10/31/2013  . BREAST LUMPECTOMY Left 11/05/2013  . BREAST LUMPECTOMY WITH NEEDLE  LOCALIZATION AND AXILLARY LYMPH NODE DISSECTION Left 11/05/2013   Procedure: LEFT BREAST LUMPECTOMY WITH NEEDLE LOCALIZATION and axillary lymph Node Dissection;  Surgeon: Edward Jolly, MD;  Location: [place];  Service: General;  Laterality: Left;  . BREAST SURGERY  11/2013   left lumpectomy  . CATARACT EXTRACTION W/ INTRAOCULAR LENS  IMPLANT, BILATERAL  2012   bilateral  . COLONOSCOPY  11/04/2015   per Dr. Fuller Plan, adenomatous polyps, no repeats planned   . EYE SURGERY  11/22/2006   cataracts, bilateral, intraocular lens implant  . JOINT REPLACEMENT     2019  . LEFT HEART CATHETERIZATION WITH CORONARY ANGIOGRAM N/A 12/19/2013   Procedure: LEFT HEART CATHETERIZATION WITH CORONARY ANGIOGRAM;  Surgeon: Burnell Blanks, MD;  [facility];  Service: Cardiovascular;  Laterality: N/A;  . lumbar epidural steroid injection     lumber spinal stenosis  . LUMBAR LAMINECTOMY/DECOMPRESSION MICRODISCECTOMY Right 03/01/2018   Procedure: RIGHT LUMBAR FOUR- LUMBAR FIVE LAMINECTOMY/MICRODISCECTOMY;  Surgeon: [name], MD;  Location: [place];  Service: Neurosurgery;  Laterality: Right;  . POLYPECTOMY    . PORTACATH PLACEMENT Right 12/11/2013   Procedure: INSERTION PORT-A-CATH;  Surgeon: Edward Jolly, MD;  Location: [place];  Service: General;  Laterality: Right;  Subclavian Vein;   . TOTAL KNEE ARTHROPLASTY Right 10/24/2017   Procedure: TOTAL RIGHT KNEE ARTHROPLASTY;  Surgeon: Gaynelle Arabian, MD;  Location: [facility];  Service: Orthopedics;  Laterality: Right;     OB History    Gravida  2   Para  2   Term      Preterm      AB      Living        SAB      TAB      Ectopic      Multiple      Live Births           Obstetric Comments  Menarche age [age], parity age 11, G1,P2,  No BC, HRT x 20 years         Home Medications    Prior to Admission medications   Medication Sig Start Date End Date Taking? Authorizing Provider    aspirin EC 81 MG tablet Take 81 mg by mouth daily.   Yes [provider]  Calcium Carbonate-Vitamin D (CALCIUM-D PO) Take 1 tablet by mouth daily.   Yes [provider]  exemestane (AROMASIN) 25 MG tablet Take 1 tablet (25 mg total) by mouth daily after breakfast. 06/27/18  Yes Ennever, Rudell Cobb, MD  ezetimibe (ZETIA) 10 MG tablet TAKE 1 TABLET BY MOUTH ONCE DAILY 08/15/18  Yes Larey Dresser, MD  gabapentin (NEURONTIN) 100 MG capsule Take 100 mg by mouth 2 (two) times daily. 03/17/18  Yes [provider]  HYDROcodone-acetaminophen (NORCO/VICODIN) 5-325 MG tablet Take 1 tablet by mouth every 4 (four) hours as needed (pain). 03/02/18  Yes [name], MD  levothyroxine (SYNTHROID, LEVOTHROID) 137 MCG tablet Take 1 tablet (137 mcg total) by mouth daily before breakfast. 08/08/18  Yes Laurey Morale, MD  metoprolol succinate (TOPROL-XL) 50 MG 24 hr tablet TAKE 1 TABLET BY MOUTH 2 TIMES DAILY WITH OR IMMEDIATELY FOLLOWING A MEAL 03/20/18  Yes Larey Dresser, MD  Multiple Vitamin (MULTIVITAMIN WITH MINERALS) TABS tablet Take 1 tablet by mouth daily.   Yes [provider]  methylPREDNISolone (MEDROL DOSEPAK) 4 MG TBPK tablet Take 4 mg by mouth. 03/21/18   [provider]  ferrous sulfate 325 (65 FE) MG tablet Take 325 mg by mouth 2 (two) times daily.    12/21/11  [provider]  pravastatin (PRAVACHOL) 80 MG tablet Take 1 tablet (80 mg total) by mouth every evening. 11/20/15 04/08/16  Larey Dresser, MD    Family History Family History  Problem Relation Age of Onset  . Alcohol abuse Father   . Kidney failure Mother   . Colon cancer Neg Hx   . Rectal cancer Neg Hx   . Stomach cancer Neg Hx   . Colon polyps Neg Hx     Social History Social History   Tobacco Use  . Smoking status: Never Smoker  . Smokeless tobacco: Never Used  . Tobacco comment: never used tobacco  Substance Use Topics  . Alcohol use: No    Alcohol/week: 0.0 standard  drinks  . Drug use: No     Allergies   Levaquin [levofloxacin]; Penicillins; Propoxyphene n-acetaminophen; Statins; Cortisone; Evolocumab; Myrbetriq [mirabegron]; and Pseudoephedrine   Review of Systems Review of Systems  Constitutional: Positive for fatigue. Negative for chills and fever.  HENT: Negative for ear pain and sore throat.   Eyes: Negative for pain and visual disturbance.  Respiratory: Negative for cough and shortness of breath.   Cardiovascular: Positive for chest pain. Negative for palpitations.  Gastrointestinal: Negative for abdominal pain and vomiting.  Genitourinary: Negative for dysuria and hematuria.  Musculoskeletal: Negative for arthralgias and back pain.  Skin: Negative for color change and rash.  Neurological: Positive for weakness. Negative for dizziness, seizures, loss of consciousness and syncope.  All other systems reviewed and are negative.    Physical Exam Updated Vital Signs  ED Triage Vitals  Enc Vitals Group     BP 10/14/18 0945 (!) 171/123     Pulse Rate 10/14/18 0945 (!) 124     Resp 10/14/18 0945 (!) 22     Temp 10/14/18 0947 98.6 F (37 C)     Temp Source 10/14/18 0947 Oral     SpO2 10/14/18 0945 97 %     Weight 10/14/18 0951 140 lb (63.5 kg)     Height 10/14/18 0951 5\' 8"  (1.727 m)     Head Circumference --      Peak Flow --      Pain Score 10/14/18 0951 0     Pain Loc --      Pain Edu? --      Excl. in [place]? --     Physical Exam Vitals signs and nursing note reviewed.  Constitutional:      General: She is not in acute distress.    Appearance: She is well-developed.  HENT:     Head: Normocephalic and atraumatic.  Eyes:     Conjunctiva/sclera: Conjunctivae normal.     Pupils: Pupils are equal, round, and reactive to light.  Neck:     Musculoskeletal: Normal range of motion and neck supple.  Cardiovascular:  Rate and Rhythm: Tachycardia present. Rhythm irregular.     Pulses:          Radial pulses are 2+ on the right  side and 2+ on the left side.       Dorsalis pedis pulses are 2+ on the right side and 2+ on the left side.     Heart sounds: No murmur.  Pulmonary:     Effort: Pulmonary effort is normal. No respiratory distress.     Breath sounds: Normal breath sounds. No wheezing or rhonchi.  Chest:     Chest wall: No edema.  Abdominal:     Palpations: Abdomen is soft.     Tenderness: There is no abdominal tenderness.  Musculoskeletal: Normal range of motion.     Right lower leg: No edema.     Left lower leg: No edema.  Skin:    General: Skin is warm and dry.  Neurological:     General: No focal deficit present.     Mental Status: She is alert and oriented to person, place, and time.     Cranial Nerves: No cranial nerve deficit.     Motor: No weakness.  Psychiatric:        Mood and Affect: Mood normal.      ED Treatments / Results  Labs (all labs ordered are listed, but only abnormal results are displayed) Labs Reviewed  COMPREHENSIVE METABOLIC PANEL - Abnormal; Notable for the following components:      Result Value   CO2 19 (*)    Glucose, Bld 119 (*)    Creatinine, Ser 1.15 (*)    Total Protein 6.1 (*)    ALT 67 (*)    GFR calc non Af Amer 45 (*)    GFR calc Af Amer 52 (*)    All other components within normal limits  URINALYSIS, ROUTINE W REFLEX MICROSCOPIC - Abnormal; Notable for the following components:   Hgb urine dipstick MODERATE (*)    Ketones, ur 5 (*)    Protein, ur 100 (*)    Leukocytes, UA TRACE (*)    Bacteria, UA MANY (*)    All other components within normal limits  BRAIN NATRIURETIC PEPTIDE - Abnormal; Notable for the following components:   B Natriuretic Peptide 814.4 (*)    All other components within normal limits  CBC WITH DIFFERENTIAL/PLATELET  TSH  PROTIME-INR  HEPARIN LEVEL (UNFRACTIONATED)  TROPONIN I  TROPONIN I  TROPONIN I  HEMOGLOBIN A1C  I-STAT TROPONIN, ED    EKG EKG Interpretation  Date/Time:  Saturday October 14 2018 09:42:37  EST Ventricular Rate:  135 PR Interval:    QRS Duration: 108 QT Interval:  349 QTC Calculation: 524 R Axis:   130 Text Interpretation:  Atrial fibrillation Anterior infarct, old Repolarization abnormality, prob rate related Prolonged QT interval Confirmed by Lennice Sites [id number]) on 10/14/2018 9:51:34 AM   Radiology Dg Chest Portable 1 View  Result Date: 10/14/2018 CLINICAL DATA:  Back pain between her shoulder blades. EXAM: PORTABLE CHEST 1 VIEW COMPARISON:  Chest x-ray dated August 24, 2017. FINDINGS: Stable cardiomegaly. Normal pulmonary vascularity. Patchy opacity in the left lower lobe silhouetting the left hemidiaphragm. No pleural effusion or pneumothorax. No acute osseous abnormality. IMPRESSION: 1. Left lower lobe atelectasis versus infiltrate. Electronically Signed   By: Titus Dubin M.D.   On: 10/14/2018 10:17    Procedures .Critical Care Performed by: Lennice Sites, DO Authorized by: Lennice Sites, DO   Critical care provider statement:  Critical care time (minutes):  35   Critical care time was exclusive of:  Separately billable procedures and treating other patients and teaching time   Critical care was necessary to treat or prevent imminent or life-threatening deterioration of the following conditions:  Cardiac failure   Critical care was time spent personally by me on the following activities:  Blood draw for specimens, development of treatment plan with patient or surrogate, discussions with primary provider, evaluation of patient's response to treatment, interpretation of cardiac output measurements, obtaining history from patient or surrogate, ordering and performing treatments and interventions, ordering and review of laboratory studies, ordering and review of radiographic studies, pulse oximetry, re-evaluation of patient's condition and review of old charts   I assumed direction of critical care for this patient from another provider in my specialty: no      (including critical care time)  Medications Ordered in ED Medications  diltiazem (CARDIZEM) 1 mg/mL load via infusion 10 mg (10 mg Intravenous Bolus from Bag 10/14/18 1457)    And  diltiazem (CARDIZEM) 100 mg in dextrose 5% 161mL (1 mg/mL) infusion (10 mg/hr Intravenous Rate/Dose Change 10/14/18 1637)  nitroGLYCERIN (NITROSTAT) SL tablet 0.4 mg (has no administration in time range)  acetaminophen (TYLENOL) tablet 650 mg (has no administration in time range)  ondansetron (ZOFRAN) injection 4 mg (has no administration in time range)  zolpidem (AMBIEN) tablet 5 mg (has no administration in time range)  ALPRAZolam (XANAX) tablet 0.25 mg (has no administration in time range)  aspirin EC tablet 81 mg (81 mg Oral Not Given 10/14/18 1446)  exemestane (AROMASIN) tablet 25 mg (has no administration in time range)  ezetimibe (ZETIA) tablet 10 mg (has no administration in time range)  gabapentin (NEURONTIN) capsule 100 mg (has no administration in time range)  HYDROcodone-acetaminophen (NORCO/VICODIN) 5-325 MG per tablet 1 tablet (has no administration in time range)  levothyroxine (SYNTHROID, LEVOTHROID) tablet 137 mcg (has no administration in time range)  metoprolol succinate (TOPROL-XL) 24 hr tablet 50 mg (has no administration in time range)  multivitamin with minerals tablet 1 tablet (has no administration in time range)  heparin ADULT infusion 100 units/mL (25000 units/280mL sodium chloride 0.45%) (12 Units/kg/hr  63.5 kg Intravenous New Bag/Given 10/14/18 1546)  aspirin chewable tablet 324 mg (324 mg Oral Given 10/14/18 1451)    Or  aspirin suppository 300 mg ( Rectal See Alternative 10/14/18 1451)  heparin bolus via infusion 2,000 Units (2,000 Units Intravenous Bolus from Bag 10/14/18 1548)     Initial Impression / Assessment and Plan / ED Course  I have reviewed the triage vital signs and the nursing notes.  Pertinent labs & imaging results that were available during my care of the patient  were reviewed by me and considered in my medical decision making (see chart for details).     Kim [name] is an 84 year old female with history of hypertension, CAD who presents to the ED with weakness, chest pain.  Patient with tachycardia, otherwise normal vitals.  EKG shows atrial fibrillation with RVR.  Upon chart review this is new for the patient.  She states that she has felt bad for the last several days.  Has not noticed when her heart became fast.  Has had some chest pain, some shortness of breath.  Overall patient is well-appearing.  Good pulses throughout, clear breath sounds.  Likely symptomatic from atrial fibrillation.  Troponin within normal limits.  No significant anemia, electrolyte abnormality, kidney injury.  Urinalysis with no obvious infection.  Troponin within normal limits.  Thyroid within normal limits.  BNP mildly elevated.  Upon chart review patient with recent echocardiograms that shows that she has severe obstructive cardiomyopathy.  Chest x-ray showed no obvious pneumonia, pneumothorax.  Patient without any infectious symptoms.  Doubt infectious process at this time.  Overall patient with new atrial fibrillation.  Given her history of obstructive cardiomyopathy contacted cardiology for admission.  They came down to the ED to evaluate the patient and they recommend starting the patient on IV diltiazem.  Patient was given IV diltiazem bolus and started on a drip and heart rate improved.  She was admitted to their service in stable condition for further work-up.  This chart was dictated using voice recognition software.  Despite best efforts to proofread,  errors can occur which can change the documentation meaning.  Final Clinical Impressions(s) / ED Diagnoses   Final diagnoses:  Atrial fibrillation with RVR [facility])    ED Discharge Orders    None       Lennice Sites, DO 10/14/18 1650

## 2018-10-15 LAB — CBC
HCT: 34.8 % — ABNORMAL LOW (ref 36.0–46.0)
Hemoglobin: 10.9 g/dL — ABNORMAL LOW (ref 12.0–15.0)
MCH: 29.1 pg (ref 26.0–34.0)
MCHC: 31.3 g/dL (ref 30.0–36.0)
MCV: 93 fL (ref 80.0–100.0)
Platelets: 124 10*3/uL — ABNORMAL LOW (ref 150–400)
RBC: 3.74 MIL/uL — ABNORMAL LOW (ref 3.87–5.11)
RDW: 14.4 % (ref 11.5–15.5)
WBC: 6.3 10*3/uL (ref 4.0–10.5)
nRBC: 0 % (ref 0.0–0.2)

## 2018-10-15 LAB — LIPID PANEL
Cholesterol: 134 mg/dL (ref 0–200)
HDL: 28 mg/dL — ABNORMAL LOW (ref >40)
LDL Cholesterol: 95 mg/dL (ref 0–99)
Total CHOL/HDL Ratio: 4.8 RATIO
Triglycerides: 56 mg/dL (ref <150)
VLDL: 11 mg/dL (ref 0–40)

## 2018-10-15 LAB — BASIC METABOLIC PANEL
Anion gap: 10 (ref 5–15)
BUN: 18 mg/dL (ref 8–23)
CO2: 19 mmol/L — ABNORMAL LOW (ref 22–32)
Calcium: 8.5 mg/dL — ABNORMAL LOW (ref 8.9–10.3)
Chloride: 107 mmol/L (ref 98–111)
Creatinine, Ser: 1.12 mg/dL — ABNORMAL HIGH (ref 0.44–1.00)
GFR calc Af Amer: 53 mL/min — ABNORMAL LOW (ref >60)
GFR calc non Af Amer: 46 mL/min — ABNORMAL LOW (ref >60)
Glucose, Bld: 97 mg/dL (ref 70–99)
Potassium: 3.8 mmol/L (ref 3.5–5.1)
Sodium: 136 mmol/L (ref 135–145)

## 2018-10-15 LAB — TROPONIN I: Troponin I: <0.03 ng/mL (ref <0.03)

## 2018-10-15 LAB — HEPARIN LEVEL (UNFRACTIONATED): Heparin Unfractionated: 0.33 IU/mL (ref 0.30–0.70)

## 2018-10-15 LAB — GLUCOSE, CAPILLARY: Glucose-Capillary: 86 mg/dL (ref 70–99)

## 2018-10-15 MED ORDER — APIXABAN 5 MG PO TABS
5.0000 mg | ORAL_TABLET | Freq: Two times a day (BID) | ORAL | Status: DC
  Administered 2018-10-15 – 2018-10-18 (×7): 5 mg via ORAL
  Filled 2018-10-15 (×7): qty 1

## 2018-10-15 NOTE — Progress Notes (Addendum)
Progress Note  Patient Name: Kim [name] Date of Encounter: 10/15/2018  Primary Cardiologist:   No primary care provider on file.   Subjective   No chest pain.  No SOB.  Feels tired  Inpatient Medications    Scheduled Meds: . aspirin EC  81 mg Oral Daily  . exemestane  25 mg Oral QPC breakfast  . ezetimibe  10 mg Oral Daily  . gabapentin  100 mg Oral BID  . levothyroxine  137 mcg Oral QAC breakfast  . metoprolol succinate  50 mg Oral Daily  . multivitamin with minerals  1 tablet Oral Daily  . sulfamethoxazole-trimethoprim  1 tablet Oral Q12H   Continuous Infusions: . diltiazem (CARDIZEM) infusion 7.5 mg/hr (10/15/18 0007)  . heparin 900 Units/hr (10/14/18 2352)   PRN Meds: acetaminophen, ALPRAZolam, HYDROcodone-acetaminophen, nitroGLYCERIN, ondansetron (ZOFRAN) IV, zolpidem   Vital Signs    Vitals:   10/14/18 1626 10/14/18 1802 10/14/18 1944 10/15/18 0401  BP: (!) 154/103 (!) 160/109 137/87 (!) 127/99  Pulse: (!) 128 (!) 110 96 (!) 124  Resp: 13  (!) 25 17  Temp: (!) 97.5 F (36.4 C)  97.8 F (36.6 C) 98 F (36.7 C)  TempSrc: Oral  Oral Oral  SpO2: 96%  94% 99%  Weight:    69.2 kg  Height:        Intake/Output Summary (Last 24 hours) at 10/15/2018 0925 Last data filed at 10/15/2018 0600 Gross per 24 hour  Intake 520.39 ml  Output 250 ml  Net 270.39 ml   Filed Weights   10/14/18 0951 10/15/18 0401  Weight: 63.5 kg 69.2 kg    Telemetry    Atrial fib with rapida rate - Personally Reviewed  ECG    NA - Personally Reviewed  Physical Exam   GEN: No acute distress.   Neck: No  JVD Cardiac:   Irregular RR, no murmurs, rubs, or gallops.  Respiratory: Clear  to auscultation bilaterally. GI: Soft, nontender, non-distended  MS: No  edema; No deformity. Neuro:  Nonfocal  Psych: Normal affect   Labs    Chemistry Recent Labs  Lab 10/14/18 0950 10/15/18 0355  NA 139 136  K 3.9 3.8  CL 110 107  CO2 19* 19*  GLUCOSE 119* 97  BUN 23 18    CREATININE 1.15* 1.12*  CALCIUM 8.9 8.5*  PROT 6.1*  --   ALBUMIN 3.7  --   AST 31  --   ALT 67*  --   ALKPHOS 68  --   BILITOT 1.2  --   GFRNONAA 45* 46*  GFRAA 52* 53*  ANIONGAP 10 10     Hematology Recent Labs  Lab 10/14/18 0950 10/15/18 0355  WBC 7.3 6.3  RBC 4.10 3.74*  HGB 12.4 10.9*  HCT 39.5 34.8*  MCV 96.3 93.0  MCH 30.2 29.1  MCHC 31.4 31.3  RDW 14.5 14.4  PLT 157 124*    Cardiac Enzymes Recent Labs  Lab 10/14/18 1753 10/14/18 2251 10/15/18 0355  TROPONINI <0.03 <0.03 <0.03    Recent Labs  Lab 10/14/18 0955  TROPIPOC 0.01     BNP Recent Labs  Lab 10/14/18 0950  BNP 814.4*     DDimer No results for input(s): DDIMER in the last 168 hours.   Radiology    Dg Chest Portable 1 View  Result Date: 10/14/2018 CLINICAL DATA:  Back pain between her shoulder blades. EXAM: PORTABLE CHEST 1 VIEW COMPARISON:  Chest x-ray dated August 24, 2017. FINDINGS: Stable cardiomegaly.  Normal pulmonary vascularity. Patchy opacity in the left lower lobe silhouetting the left hemidiaphragm. No pleural effusion or pneumothorax. No acute osseous abnormality. IMPRESSION: 1. Left lower lobe atelectasis versus infiltrate. Electronically Signed   By: Titus Dubin M.D.   On: 10/14/2018 10:17    Cardiac Studies   NA  Patient Profile     84 y.o. female with a history of DES RCA 2015, HOCM, HTN, HLD intol statins and Repatha, hypothyroid, breast CA on chemo s/p XRT, HOH.   Assessment & Plan    CHEST PAIN:  Negative troponin.  No ischemia work up.  Symptoms likely related to atrial fib.   ATRIAL FIB WITH RVR:  On heparin.   Change to Eliquis.  Plan for TEE/DCCV.  (NPO after MN.  Orders not entered as we will need to check tomorrow AM if this can be scheduled.  This message was sent to staff.)    HTN:    BP fluctuating but slightly high on average.  Continue current beta blocker and likely titrate this vs. This plus Cardizem after the cardioversion.   HOCM:   Euvolemic.    ANEMIA:  Fall in all of her counts.  Repeat in the AM.      UTI:  Being treated with Bactrim.  Day 2.  (Multiple drug allergies.)     For questions or updates, please contact [place] Please consult [email or web address] for contact info under Cardiology/STEMI.   Signed, Minus Breeding, MD  10/15/2018, 9:25 AM

## 2018-10-15 NOTE — Discharge Instructions (Signed)

## 2018-10-15 NOTE — Progress Notes (Signed)
ANTICOAGULATION CONSULT NOTE - Follow Up Consult  Pharmacy Consult for heparin Indication: Afib and USAP  Labs: Recent Labs    10/14/18 0950 10/14/18 1545 10/14/18 1753 10/14/18 2251 10/15/18 0355  HGB 12.4  --   --   --  10.9*  HCT 39.5  --   --   --  34.8*  PLT 157  --   --   --  124*  LABPROT  --  15.0  --   --   --   INR  --  1.19  --   --   --   HEPARINUNFRC  --   --   --  0.20* 0.33  CREATININE 1.15*  --   --   --  1.12*  TROPONINI  --   --  <0.03 <0.03 <0.03    Assessment: 83yo female subtherapeutic on heparin with initial dosing for new Afib and USAP; no gtt issues or signs of bleeding per RN.  1/12 AM update: heparin level drawn early this morning at 0400 resulted therapeutic at 0.33. Will check another level for confirmation this afternoon, given early lab draw.  Goal of Therapy:  Heparin level 0.3-0.7 units/ml   Plan:  Continue heparin infusion at 900 units/hr Heparin level at 1200 Daily heparin levels, CBC, monitor for s/sx of bleeding  Thank you for involving pharmacy in this patient's care.  Janae Bridgeman, PharmD PGY1 Pharmacy Resident Phone: [phone] 10/15/2018 8:09 AM

## 2018-10-16 ENCOUNTER — Inpatient Hospital Stay (HOSPITAL_COMMUNITY): Payer: Medicare Other

## 2018-10-16 DIAGNOSIS — I4819 Other persistent atrial fibrillation: Principal | ICD-10-CM

## 2018-10-16 DIAGNOSIS — I421 Obstructive hypertrophic cardiomyopathy: Secondary | ICD-10-CM

## 2018-10-16 LAB — CBC
HCT: 35.7 % — ABNORMAL LOW (ref 36.0–46.0)
Hemoglobin: 11.1 g/dL — ABNORMAL LOW (ref 12.0–15.0)
MCH: 29 pg (ref 26.0–34.0)
MCHC: 31.1 g/dL (ref 30.0–36.0)
MCV: 93.2 fL (ref 80.0–100.0)
Platelets: 122 10*3/uL — ABNORMAL LOW (ref 150–400)
RBC: 3.83 MIL/uL — ABNORMAL LOW (ref 3.87–5.11)
RDW: 14.4 % (ref 11.5–15.5)
WBC: 5.7 10*3/uL (ref 4.0–10.5)
nRBC: 0 % (ref 0.0–0.2)

## 2018-10-16 LAB — MAGNESIUM: Magnesium: 2.1 mg/dL (ref 1.7–2.4)

## 2018-10-16 MED ORDER — SODIUM CHLORIDE 0.9 % IV SOLN: 250.0000 mL | INTRAVENOUS | Status: DC

## 2018-10-16 MED ORDER — SODIUM CHLORIDE 0.9% FLUSH: 3.0000 mL | INTRAVENOUS | Status: DC | PRN

## 2018-10-16 MED ORDER — SODIUM CHLORIDE 0.9% FLUSH
3.0000 mL | Freq: Two times a day (BID) | INTRAVENOUS | Status: DC
  Administered 2018-10-16 – 2018-10-17 (×3): 3 mL via INTRAVENOUS

## 2018-10-16 NOTE — Progress Notes (Addendum)
Advanced Heart Failure Rounding Note  PCP-Cardiologist: No primary care provider on file.   Subjective:    Presented to [facility] 10/14/2018 with CP. Troponins negative, but found to be Afib with RVR.   Feeling Ok today. Over the past week had chest discomfort, lightheadedness, and fatigue. She has not felt palpitations.   Echo 01/2017 LVEF 55-60%, severe asymmetric septal hypertrophy, Mild MR, Mod/Sev LAE, PA peak pressure 32 mm HG.   Objective:   Weight Range: 69.9 kg Body mass index is 23.45 kg/m.   Vital Signs:   Temp:  [97.8 F (36.6 C)-98.4 F (36.9 C)] 98.4 F (36.9 C) (01/13 0411) Pulse Rate:  [85-103] 96 (01/13 0411) Resp:  [13-18] 13 (01/13 0411) BP: (99-136)/(81-90) 136/82 (01/13 0411) SpO2:  [95 %-96 %] 95 % (01/13 0411) Weight:  [69.9 kg] 69.9 kg (01/13 0411) Last BM Date: 10/13/18  Weight change: Filed Weights   10/14/18 0951 10/15/18 0401 10/16/18 0411  Weight: 63.5 kg 69.2 kg 69.9 kg    Intake/Output:   Intake/Output Summary (Last 24 hours) at 10/16/2018 0932 Last data filed at 10/15/2018 1300 Gross per 24 hour  Intake 531.17 ml  Output 300 ml  Net 231.17 ml      Physical Exam    General: No resp difficulty HEENT: Normal Neck: Supple. JVP 6-7 cm. Carotids 2+ bilat; no bruits. No lymphadenopathy or thyromegaly appreciated. Cor: PMI nondisplaced. Irregularly irregular. No rubs, gallops or murmurs. Lungs: Clear Abdomen: Soft, nontender, nondistended. No hepatosplenomegaly. No bruits or masses. Good bowel sounds. Extremities: No cyanosis, clubbing, rash, edema Neuro: Alert & orientedx3, cranial nerves grossly intact. moves all 4 extremities w/o difficulty. Affect pleasant  Telemetry   Afib 100-110s, personally reviewed.   EKG    10/16/2018 Afib RVR 111 bpm, personally reviewed.   Labs    CBC Recent Labs    10/14/18 0950 10/15/18 0355 10/16/18 0403  WBC 7.3 6.3 5.7  NEUTROABS 5.6  --   --   HGB 12.4 10.9* 11.1*  HCT 39.5 34.8* 35.7*    MCV 96.3 93.0 93.2  PLT 157 124* 026*   Basic Metabolic Panel Recent Labs    10/14/18 0950 10/15/18 0355  NA 139 136  K 3.9 3.8  CL 110 107  CO2 19* 19*  GLUCOSE 119* 97  BUN 23 18  CREATININE 1.15* 1.12*  CALCIUM 8.9 8.5*   Liver Function Tests Recent Labs    10/14/18 0950  AST 31  ALT 67*  ALKPHOS 68  BILITOT 1.2  PROT 6.1*  ALBUMIN 3.7   No results for input(s): LIPASE, AMYLASE in the last 72 hours. Cardiac Enzymes Recent Labs    10/14/18 1753 10/14/18 2251 10/15/18 0355  TROPONINI <0.03 <0.03 <0.03    BNP: BNP (last 3 results) Recent Labs    10/14/18 0950  BNP 814.4*    ProBNP (last 3 results) No results for input(s): PROBNP in the last 8760 hours.   D-Dimer No results for input(s): DDIMER in the last 72 hours. Hemoglobin A1C Recent Labs    10/14/18 1753  HGBA1C 5.4   Fasting Lipid Panel Recent Labs    10/15/18 0355  CHOL 134  HDL 28*  LDLCALC 95  TRIG 56  CHOLHDL 4.8   Thyroid Function Tests Recent Labs    10/14/18 1442  TSH 1.092    Other results:   Imaging     No results found.   Medications:     Scheduled Medications: . apixaban  5 mg Oral  BID  . aspirin EC  81 mg Oral Daily  . exemestane  25 mg Oral QPC breakfast  . ezetimibe  10 mg Oral Daily  . gabapentin  100 mg Oral BID  . levothyroxine  137 mcg Oral QAC breakfast  . metoprolol succinate  50 mg Oral Daily  . multivitamin with minerals  1 tablet Oral Daily  . sulfamethoxazole-trimethoprim  1 tablet Oral Q12H     Infusions: . diltiazem (CARDIZEM) infusion 7.5 mg/hr (10/16/18 0152)     PRN Medications:  acetaminophen, ALPRAZolam, HYDROcodone-acetaminophen, nitroGLYCERIN, ondansetron (ZOFRAN) IV, zolpidem  Patient Profile   DONNETTE MACMULLEN is a 84 y.o. female with a history of DES RCA 2015, HOCM, HTN, HLDintol statins and Repatha, hypothyroid, breast CA on chemo s/p XRT, HOH.  Admitted with chest pain, negative troponins. Found to have new  Afib RVR.   Assessment/Plan   1. Afib with RVR - This patients CHA2DS2-VASc at least 5.  - Started on Eliquis 10/15/2018. Will receive 5 doses prior to DCCV. Will need TEE with unclear chronicity.  - Continue diltiazem gtt at 7.5 mg /hr.  - Continue toprol XL 50 mg daily.  2. CAD: Status post PCI for unstable angina in 3/15 with DES to [facility]. She is now off Plavix.  - No s/s of ischemia.    - Continue Toprol XL as above.  - Will stop ASA with addition of Eliquis. - Unable to tolerate statins or Repatha, now on Zetia.  3. Hyperlipidemia:  - Myalgias with Crestor, Lipitor, and pravastatin.  Myalgias with Repatha. - Continue Zetia 10 mg daily.  4. Hypertrophic obstructive cardiomyopathy: No family history of HOCM or sudden death (interestingly, her husband has HOCM).  Most recent echo in 4/18 showed a small LVOT gradient and severe asymmetric septal hypertrophy.  Cardiac MRI in 5/15 showed no delayed enhancement.  - Continue current Toprol XL.  - Per patient, her 2 sons have had screening echoes to look for hypertrophic cardiomyopathy and did not show signs of HOCM.    Plan for TEE/DCCV, will have to be tomorrow based on scheduling.   Medication concerns reviewed with patient and pharmacy team. Barriers identified: None at this time.   Length of Stay: 2  Annamaria Helling  10/16/2018, 9:32 AM  Advanced Heart Failure Team Pager [phone] (M-F; 7a - 4p)  Please contact [facility] for night-coverage after hours (4p -7a ) and weekends on [email or web address]   Patient seen with PA, agree with the above note.   She remains in atrial fibrillation with mild RVR on diltiazem gtt and Toprol XL.  She feels palpitations but is not short of breath at rest.  She has history of HCM and CAD.    On exam, clear lungs.  Irregular rhythm, mildly tachy.  JVP 8 cm.    Continue apixaban along with diltiazem gtt and Toprol XL.  Will plan TEE-guided DCCV.  Discussed risks/benefits with patient, will  plan on procedure tomorrow.   Can stop ASA with remote stent and starting Eliquis.  Unable to tolerate statins or Repatha, continues on Zetia.  [name] 10/16/2018 10:08 AM

## 2018-10-16 NOTE — Procedures (Signed)
Cannot do echo at this time since patient is with IV team.

## 2018-10-16 NOTE — Care Management (Signed)
#  4.   S/W NORA  @ [employer] # [phone]   APIXABAN :  NONE FORMULARY   ELIQUIS  5 MG BID COVER- YES CO-PAY- $ 70.00 TIER- 2 DRUG PRIOR APPROVAL- NO  DEDUCTIBLE: NOT MET  PREFERRED PHARMACY : YES [place]  [facility]

## 2018-10-17 ENCOUNTER — Encounter (HOSPITAL_COMMUNITY): Payer: Self-pay | Admitting: *Deleted

## 2018-10-17 ENCOUNTER — Encounter (HOSPITAL_COMMUNITY)
Admission: EM | Disposition: A | Discharge status: Home / Self Care | Payer: Self-pay | Source: Home / Self Care | Attending: Cardiology

## 2018-10-17 ENCOUNTER — Other Ambulatory Visit: Payer: Self-pay

## 2018-10-17 ENCOUNTER — Other Ambulatory Visit (HOSPITAL_COMMUNITY): Payer: Medicare Other

## 2018-10-17 ENCOUNTER — Inpatient Hospital Stay (HOSPITAL_COMMUNITY): Payer: Medicare Other | Admitting: Anesthesiology

## 2018-10-17 ENCOUNTER — Inpatient Hospital Stay (HOSPITAL_COMMUNITY): Payer: Medicare Other

## 2018-10-17 DIAGNOSIS — I4891 Unspecified atrial fibrillation: Secondary | ICD-10-CM

## 2018-10-17 DIAGNOSIS — I340 Nonrheumatic mitral (valve) insufficiency: Secondary | ICD-10-CM

## 2018-10-17 HISTORY — PX: CARDIOVERSION: SHX1299

## 2018-10-17 HISTORY — PX: TEE WITHOUT CARDIOVERSION: SHX5443

## 2018-10-17 LAB — BASIC METABOLIC PANEL
Anion gap: 8 (ref 5–15)
BUN: 18 mg/dL (ref 8–23)
CO2: 20 mmol/L — ABNORMAL LOW (ref 22–32)
Calcium: 8.9 mg/dL (ref 8.9–10.3)
Chloride: 109 mmol/L (ref 98–111)
Creatinine, Ser: 1.3 mg/dL — ABNORMAL HIGH (ref 0.44–1.00)
GFR calc Af Amer: 45 mL/min — ABNORMAL LOW (ref >60)
GFR calc non Af Amer: 38 mL/min — ABNORMAL LOW (ref >60)
Glucose, Bld: 110 mg/dL — ABNORMAL HIGH (ref 70–99)
Potassium: 3.8 mmol/L (ref 3.5–5.1)
Sodium: 137 mmol/L (ref 135–145)

## 2018-10-17 LAB — CBC WITH DIFFERENTIAL/PLATELET
Abs Immature Granulocytes: 0.01 10*3/uL (ref 0.00–0.07)
Basophils Absolute: 0 10*3/uL (ref 0.0–0.1)
Basophils Relative: 1 %
Eosinophils Absolute: 0.2 10*3/uL (ref 0.0–0.5)
Eosinophils Relative: 3 %
HCT: 33.4 % — ABNORMAL LOW (ref 36.0–46.0)
Hemoglobin: 10.4 g/dL — ABNORMAL LOW (ref 12.0–15.0)
Immature Granulocytes: 0 %
Lymphocytes Relative: 18 %
Lymphs Abs: 0.9 10*3/uL (ref 0.7–4.0)
MCH: 29.5 pg (ref 26.0–34.0)
MCHC: 31.1 g/dL (ref 30.0–36.0)
MCV: 94.6 fL (ref 80.0–100.0)
Monocytes Absolute: 0.5 10*3/uL (ref 0.1–1.0)
Monocytes Relative: 11 %
Neutro Abs: 3.4 10*3/uL (ref 1.7–7.7)
Neutrophils Relative %: 67 %
Platelets: 125 10*3/uL — ABNORMAL LOW (ref 150–400)
RBC: 3.53 MIL/uL — ABNORMAL LOW (ref 3.87–5.11)
RDW: 14.6 % (ref 11.5–15.5)
WBC: 5.1 10*3/uL (ref 4.0–10.5)
nRBC: 0 % (ref 0.0–0.2)

## 2018-10-17 SURGERY — ECHOCARDIOGRAM, TRANSESOPHAGEAL
Anesthesia: Monitor Anesthesia Care

## 2018-10-17 MED ORDER — LACTATED RINGERS IV SOLN
INTRAVENOUS | Status: DC
  Administered 2018-10-17: 12:00:00 via INTRAVENOUS

## 2018-10-17 MED ORDER — BUTAMBEN-TETRACAINE-BENZOCAINE 2-2-14 % EX AERO
INHALATION_SPRAY | CUTANEOUS | Status: DC | PRN
  Administered 2018-10-17: 1 via TOPICAL

## 2018-10-17 MED ORDER — LIDOCAINE 2% (20 MG/ML) 5 ML SYRINGE
INTRAMUSCULAR | Status: DC | PRN
  Administered 2018-10-17: 50 mg via INTRAVENOUS

## 2018-10-17 MED ORDER — DILTIAZEM HCL ER COATED BEADS 120 MG PO CP24
120.0000 mg | ORAL_CAPSULE | Freq: Every day | ORAL | Status: DC
  Administered 2018-10-17 – 2018-10-18 (×2): 120 mg via ORAL
  Filled 2018-10-17 (×2): qty 1

## 2018-10-17 MED ORDER — FUROSEMIDE 10 MG/ML IJ SOLN
80.0000 mg | Freq: Once | INTRAMUSCULAR | Status: AC
  Administered 2018-10-17: 80 mg via INTRAVENOUS
  Filled 2018-10-17: qty 8

## 2018-10-17 MED ORDER — PROPOFOL 500 MG/50ML IV EMUL
INTRAVENOUS | Status: DC | PRN
  Administered 2018-10-17: 100 ug/kg/min via INTRAVENOUS

## 2018-10-17 NOTE — Anesthesia Preprocedure Evaluation (Addendum)
Anesthesia Evaluation  Patient identified by MRN, date of birth, ID band Patient awake    Reviewed: Allergy & Precautions, H&P , NPO status , Patient's Chart, lab work & pertinent test results  Airway Mallampati: II  TM Distance: <3 FB Neck ROM: full   Comment: LOOKS ANTERIOR Dental  (+) Teeth Intact, Dental Advisory Given, Partial Lower LOOKS ANTERIOR:   Pulmonary neg pulmonary ROS,    breath sounds clear to auscultation       Cardiovascular hypertension, Pt. on home beta blockers and Pt. on medications + angina + CAD, + Past MI and + Cardiac Stents  + Valvular Problems/Murmurs  Rhythm:regular Rate:Normal + Systolic murmurs Echo 05/08/01: Study Conclusions - Left ventricle: Turbulence in LVOT SAM hard to appreciate smallLVOT gradient less than 41msec. Severe asymetric septalhypertophy consisant with HOCM. The cavity size was normal.Systolic function was normal. The estimated ejection fraction was in the range of 55% to 60%. Left ventricular diastolic functionparameters were normal. - Mitral valve: There was mild regurgitation. - Left atrium: The atrium was moderately to severely dilated. - Atrial septum: No defect or patent foramen ovale was identified. - Pulmonary arteries: PA peak pressure: 32 mm Hg (S). - Pericardium, extracardiac: A trivial pericardial effusion wasidentified.     Neuro/Psych negative neurological ROS     GI/Hepatic negative GI ROS, Neg liver ROS,   Endo/Other  Hypothyroidism   Renal/GU negative Renal ROS     Musculoskeletal  (+) Arthritis ,   Abdominal   Peds  Hematology negative hematology ROS (+)   Anesthesia Other Findings   Reproductive/Obstetrics H/o breast CA                            Lab Results  Component Value Date   WBC 5.1 10/17/2018   HGB 10.4 (L) 10/17/2018   HCT 33.4 (L) 10/17/2018   MCV 94.6 10/17/2018   PLT 125 (L) 10/17/2018   Lab  Results  Component Value Date   CREATININE 1.30 (H) 10/17/2018   BUN 18 10/17/2018   NA 137 10/17/2018   K 3.8 10/17/2018   CL 109 10/17/2018   CO2 20 (L) 10/17/2018    Anesthesia Physical  Anesthesia Plan  ASA: III  Anesthesia Plan: MAC and General   Post-op Pain Management:    Induction: Intravenous  PONV Risk Score and Plan: 3 and Ondansetron, Dexamethasone and Treatment may vary due to age or medical condition  Airway Management Planned: Natural Airway, Nasal Cannula, Simple Face Mask and Mask  Additional Equipment: Arterial line  Intra-op Plan:   Post-operative Plan: Extubation in OR  Informed Consent: I have reviewed the patients History and Physical, chart, labs and discussed the procedure including the risks, benefits and alternatives for the proposed anesthesia with the patient or authorized representative who has indicated his/her understanding and acceptance.     Plan Discussed with: CRNA, Surgeon and Anesthesiologist  Anesthesia Plan Comments:        Anesthesia Quick Evaluation

## 2018-10-17 NOTE — Progress Notes (Signed)
  Echocardiogram 2D Echocardiogram has been performed.  Asriel Westrup L Androw 10/17/2018, 1:12 PM

## 2018-10-17 NOTE — Plan of Care (Signed)
  Problem: Education: Goal: Knowledge of General Education information will improve Description Including pain rating scale, medication(s)/side effects and non-pharmacologic comfort measures Outcome: Progressing   Problem: Health Behavior/Discharge Planning: Goal: Ability to manage health-related needs will improve Outcome: Progressing   Problem: Activity: Goal: Risk for activity intolerance will decrease Outcome: Progressing   Problem: Clinical Measurements: Goal: Respiratory complications will improve Outcome: Completed/Met   Problem: Nutrition: Goal: Adequate nutrition will be maintained Outcome: Completed/Met

## 2018-10-17 NOTE — Procedures (Signed)
Electrical Cardioversion Procedure Note Kim [name] [id number] 1935-02-17  Procedure: Electrical Cardioversion Indications:  Atrial Fibrillation  Procedure Details Consent: Risks of procedure as well as the alternatives and risks of each were explained to the (patient/caregiver).  Consent for procedure obtained. Time Out: Verified patient identification, verified procedure, site/side was marked, verified correct patient position, special equipment/implants available, medications/allergies/relevent history reviewed, required imaging and test results available.  Performed  Patient placed on cardiac monitor, pulse oximetry, supplemental oxygen as necessary.  Sedation given: Propofol per anesthesiology Pacer pads placed anterior and posterior chest.  Cardioverted 1 time(s).  Cardioverted at [place].  Evaluation Findings: Post procedure EKG shows: NSR Complications: None Patient did tolerate procedure well.   Kim [name] 10/17/2018, 1:15 PM

## 2018-10-17 NOTE — Transfer of Care (Signed)
Immediate Anesthesia Transfer of Care Note  Patient: Kim [name]  Procedure(s) Performed: TRANSESOPHAGEAL ECHOCARDIOGRAM (TEE) (N/A ) CARDIOVERSION (N/A )  Patient Location: PACU  Anesthesia Type:General  Level of Consciousness: sedated  Airway & Oxygen Therapy: Patient Spontanous Breathing and Patient connected to face mask oxygen  Post-op Assessment: Report given to RN and Post -op Vital signs reviewed and stable  Post vital signs: Reviewed and stable  Last Vitals:  Vitals Value Taken Time  BP    Temp    Pulse 70 10/17/2018  1:17 PM  Resp 17 10/17/2018  1:17 PM  SpO2 93 % 10/17/2018  1:17 PM  Vitals shown include unvalidated device data.  Last Pain:  Vitals:   10/17/18 1231  TempSrc:   PainSc: 0-No pain      Patients Stated Pain Goal: 0 (79/44/46 1901)  Complications: No apparent anesthesia complications

## 2018-10-17 NOTE — Progress Notes (Addendum)
Advanced Heart Failure Rounding Note  PCP-Cardiologist: No primary care provider on file.   Subjective:    Presented to [facility] 10/14/2018 with CP. Troponins negative, but found to be Afib with RVR.   Feeling OK this am. Worried about her husband who had syncope and fall yesterday.  Denies lightheadedness or dizziness. Had several questions about procedure but no further after discussion.   Plan for TEE/DCCV this afternoon.   Echo 01/2017 LVEF 55-60%, severe asymmetric septal hypertrophy, Mild MR, Mod/Sev LAE, PA peak pressure 32 mm HG.   Objective:   Weight Range: 69.9 kg Body mass index is 23.45 kg/m.   Vital Signs:   Temp:  [98.1 F (36.7 C)] 98.1 F (36.7 C) (01/13 1200) Pulse Rate:  [88] 88 (01/13 1200) Resp:  [21] 21 (01/13 1200) BP: (119)/(76) 119/76 (01/13 1200) SpO2:  [96 %] 96 % (01/13 1200) Last BM Date: 10/13/18  Weight change: Filed Weights   10/14/18 0951 10/15/18 0401 10/16/18 0411  Weight: 63.5 kg 69.2 kg 69.9 kg    Intake/Output:   Intake/Output Summary (Last 24 hours) at 10/17/2018 0859 Last data filed at 10/16/2018 1700 Gross per 24 hour  Intake 912.81 ml  Output -  Net 912.81 ml      Physical Exam    General: NAD HEENT: Normal Neck: Supple. JVP 7-8 cm Carotids 2+ bilat; no bruits. No thyromegaly or nodule noted. Cor: PMI nondisplaced. Irregularly irregular. No M/G/R noted Lungs: CTAB, normal effort. Abdomen: Soft, non-tender, non-distended, no HSM. No bruits or masses. +BS  Extremities: No cyanosis, clubbing, or rash. R and LLE no edema.  Neuro: Alert & orientedx3, cranial nerves grossly intact. moves all 4 extremities w/o difficulty. Affect pleasant    Telemetry   Afib 90-110s, personally reviewed.   EKG    10/16/2018 Afib RVR 111 bpm, personally reviewed.   Labs    CBC Recent Labs    10/14/18 0950  10/16/18 0403 10/17/18 0413  WBC 7.3   < > 5.7 5.1  NEUTROABS 5.6  --   --  3.4  HGB 12.4   < > 11.1* 10.4*  HCT 39.5   <  > 35.7* 33.4*  MCV 96.3   < > 93.2 94.6  PLT 157   < > 122* 125*   < > = values in this interval not displayed.   Basic Metabolic Panel Recent Labs    10/15/18 0355 10/16/18 1026 10/17/18 0413  NA 136  --  137  K 3.8  --  3.8  CL 107  --  109  CO2 19*  --  20*  GLUCOSE 97  --  110*  BUN 18  --  18  CREATININE 1.12*  --  1.30*  CALCIUM 8.5*  --  8.9  MG  --  2.1  --    Liver Function Tests Recent Labs    10/14/18 0950  AST 31  ALT 67*  ALKPHOS 68  BILITOT 1.2  PROT 6.1*  ALBUMIN 3.7   No results for input(s): LIPASE, AMYLASE in the last 72 hours. Cardiac Enzymes Recent Labs    10/14/18 1753 10/14/18 2251 10/15/18 0355  TROPONINI <0.03 <0.03 <0.03    BNP: BNP (last 3 results) Recent Labs    10/14/18 0950  BNP 814.4*    ProBNP (last 3 results) No results for input(s): PROBNP in the last 8760 hours.   D-Dimer No results for input(s): DDIMER in the last 72 hours. Hemoglobin A1C Recent Labs  10/14/18 1753  HGBA1C 5.4   Fasting Lipid Panel Recent Labs    10/15/18 0355  CHOL 134  HDL 28*  LDLCALC 95  TRIG 56  CHOLHDL 4.8   Thyroid Function Tests Recent Labs    10/14/18 1442  TSH 1.092    Other results:   Imaging    No results found.   Medications:     Scheduled Medications: . apixaban  5 mg Oral BID  . exemestane  25 mg Oral QPC breakfast  . ezetimibe  10 mg Oral Daily  . gabapentin  100 mg Oral BID  . levothyroxine  137 mcg Oral QAC breakfast  . metoprolol succinate  50 mg Oral Daily  . multivitamin with minerals  1 tablet Oral Daily  . sodium chloride flush  3 mL Intravenous Q12H  . sulfamethoxazole-trimethoprim  1 tablet Oral Q12H    Infusions: . sodium chloride    . diltiazem (CARDIZEM) infusion 7.5 mg/hr (10/16/18 1931)    PRN Medications: acetaminophen, ALPRAZolam, HYDROcodone-acetaminophen, nitroGLYCERIN, ondansetron (ZOFRAN) IV, sodium chloride flush, zolpidem  Patient Profile   Kim [name] is a 84  y.o. female with a history of DES RCA 2015, HOCM, HTN, HLDintol statins and Repatha, hypothyroid, breast CA on chemo s/p XRT, HOH.  Admitted with chest pain, negative troponins. Found to have new Afib RVR.   Assessment/Plan   1. Afib with RVR - This patients CHA2DS2-VASc at least 5.  - Plan DCCV/TEE this pm.  - Started on Eliquis 10/15/2018. Will receive 5 doses prior to DCCV. Will need TEE with unclear chronicity.  - Continue diltiazem gtt at 7.5 mg /hr for now.  - Continue toprol XL 50 mg daily.  2. CAD: Status post PCI for unstable angina in 3/15 with DES to [facility]. She is now off Plavix.  - No s/s of ischemia.    - Continue Toprol XL as above.  - Will stop ASA with addition of Eliquis. - Unable to tolerate statins or Repatha, now on Zetia.  3. Hyperlipidemia:  - Myalgias with Crestor, Lipitor, and pravastatin.  Myalgias with Repatha. - Continue Zetia 10 mg daily.  - No change.  4. Hypertrophic obstructive cardiomyopathy: No family history of HOCM or sudden death (interestingly, her husband has HOCM).  Most recent echo in 4/18 showed a small LVOT gradient and severe asymmetric septal hypertrophy.  Cardiac MRI in 5/15 showed no delayed enhancement.  - Continue current Toprol XL.  - Per patient, her 2 sons have had screening echoes to look for hypertrophic cardiomyopathy and did not show signs of HOCM.   - No change.   Plan for TEE/DCCV this pm. Stable. K and Mg stable.   Medication concerns reviewed with patient and pharmacy team. Barriers identified: None at this time.   Length of Stay: 3  Annamaria Helling  10/17/2018, 8:59 AM  Advanced Heart Failure Team Pager [phone] (M-F; 7a - 4p)  Please contact [facility] for night-coverage after hours (4p -7a ) and weekends on [email or web address]  Patient seen with PA, agree with the above note.    TEE done today:  Impression:  1. No LA appendage thrombus.  2. Suspect severe MR.  I think the mechanism is most likely mitral  valve prolapse.  There appeared to be only minimal SAM.   Patient then had successful DCCV to NSR.   I suspect that mitral regurgitation may be the trigger for atrial fibrillation.  The left atrium is severely enlarged.  It is possible that  mitral regurgitation may improve in NSR, but it appears severe.  Question at this point will be management of the mitral regurgitation.  MV repair with MAZE would be option, may need septal myectomy as well to prevent LVOT obstruction post-op.  Will let her recover from current episode and will discuss referral to Dr. Roxy Manns as outpatient for MV repair evaluation.   Stop diltiazem gtt, start diltiazem CD 120 mg daily. She will be at risk for recurrent atrial fibrillation with severe MR and may need antiarrhythmic in the future. Her atrial fibrillation is symptomatic.   [name] 10/17/2018 1:20 PM

## 2018-10-17 NOTE — CV Procedure (Signed)
Procedure: TEE  Indication: Atrial fibrillation  Sedation: Per anesthesiology  Findings: Please see echo section for full report.  Normal left ventricular size with moderate-severe asymmetric septal hypertrophy.  There did not appear to be a significant gradient across the aortic valve/LV outflow tract.  Normal right ventricular size and systolic function.  Severe left atrial enlargement, no LA appendage thrombus. Mild right atrial enlargement. No PFO or ASD by color doppler.  Mild TR, peak RV-RA gradient 44 mmHg.  Trileaflet aortic valve with no stenosis or regurgitation.  There was prolapse of the posterior mitral leaflet.  There was minimal systolic anterior motion of the anterior mitral leaflet.  There was eccentric, anteriorly-directed mitral regurgitation.  Vena contracta 0.6 cm.  Unable to do PISA given eccentricity of jet.  Visually, I think that MR was severe. There was flattening/slight flow reversal in the pulmonary vein systolic doppler signal.   Impression:  1. No LA appendage thrombus.  2. Suspect severe MR.  I think the mechanism is most likely mitral valve prolapse.  There appeared to be only minimal SAM.   [name] 10/17/2018 1:15 PM

## 2018-10-17 NOTE — Anesthesia Postprocedure Evaluation (Signed)
Anesthesia Post Note  Patient: Kim [name]  Procedure(s) Performed: TRANSESOPHAGEAL ECHOCARDIOGRAM (TEE) (N/A ) CARDIOVERSION (N/A )     Patient location during evaluation: PACU Anesthesia Type: MAC Level of consciousness: awake and alert Pain management: pain level controlled Vital Signs Assessment: post-procedure vital signs reviewed and stable Respiratory status: spontaneous breathing, nonlabored ventilation, respiratory function stable and patient connected to nasal cannula oxygen Cardiovascular status: stable and blood pressure returned to baseline Postop Assessment: no apparent nausea or vomiting Anesthetic complications: no    Last Vitals:  Vitals:   10/17/18 1330 10/17/18 1332  BP: 119/75   Pulse: 73 73  Resp: 17 18  Temp:    SpO2: 92% 93%    Last Pain:  Vitals:   10/17/18 1343  TempSrc:   PainSc: 0-No pain                 Quintarius Ferns

## 2018-10-18 ENCOUNTER — Other Ambulatory Visit (HOSPITAL_COMMUNITY): Payer: Medicare Other

## 2018-10-18 ENCOUNTER — Encounter (HOSPITAL_COMMUNITY): Payer: Self-pay | Admitting: Cardiology

## 2018-10-18 LAB — BASIC METABOLIC PANEL
Anion gap: 14 (ref 5–15)
BUN: 21 mg/dL (ref 8–23)
CO2: 22 mmol/L (ref 22–32)
Calcium: 9.9 mg/dL (ref 8.9–10.3)
Chloride: 102 mmol/L (ref 98–111)
Creatinine, Ser: 1.3 mg/dL — ABNORMAL HIGH (ref 0.44–1.00)
GFR calc Af Amer: 45 mL/min — ABNORMAL LOW (ref >60)
GFR calc non Af Amer: 38 mL/min — ABNORMAL LOW (ref >60)
Glucose, Bld: 98 mg/dL (ref 70–99)
Potassium: 4 mmol/L (ref 3.5–5.1)
Sodium: 138 mmol/L (ref 135–145)

## 2018-10-18 LAB — BRAIN NATRIURETIC PEPTIDE: B Natriuretic Peptide: 327.3 pg/mL — ABNORMAL HIGH (ref 0.0–100.0)

## 2018-10-18 MED ORDER — FUROSEMIDE 40 MG PO TABS: 40.0000 mg | ORAL_TABLET | Freq: Every day | ORAL | 5 refills | Status: DC

## 2018-10-18 MED ORDER — METOPROLOL SUCCINATE ER 50 MG PO TB24: 50.0000 mg | ORAL_TABLET | Freq: Every day | ORAL | 5 refills | Status: DC

## 2018-10-18 MED ORDER — APIXABAN 5 MG PO TABS: 5.0000 mg | ORAL_TABLET | Freq: Two times a day (BID) | ORAL | 3 refills | Status: DC

## 2018-10-18 MED ORDER — FUROSEMIDE 40 MG PO TABS: 40.0000 mg | ORAL_TABLET | Freq: Every day | ORAL | Status: DC

## 2018-10-18 MED ORDER — POTASSIUM CHLORIDE ER 10 MEQ PO TBCR: 20.0000 meq | EXTENDED_RELEASE_TABLET | Freq: Every day | ORAL | 5 refills | Status: DC

## 2018-10-18 MED ORDER — DILTIAZEM HCL ER COATED BEADS 120 MG PO CP24: 120.0000 mg | ORAL_CAPSULE | Freq: Every day | ORAL | 5 refills | Status: DC

## 2018-10-18 MED FILL — ELIQUIS 5 MG TABLET: 5 | 30 days supply | Qty: 60 | Fill #0

## 2018-10-18 NOTE — Care Management Note (Signed)
Case Management Note  Patient Details  Name: MORAYMA GODOWN MRN: [id number] Date of Birth: 22-Nov-1934  Subjective/Objective: Pt presented for Unstable Angina and found to be in A Fib with RVR. PTA Independent from home with spouse. Daughter is in the home monitoring the spouse at this time.                     Action/Plan: Benefits check completed for Eliquis. 30 day free card provided and patient will needs new Rx sent to [facility]. Patient is aware of co pay. No further needs identified from CM at this time.    Expected Discharge Date:                  Expected Discharge Plan:  Home/Self Care  In-House Referral:  NA  Discharge planning Services  CM Consult, Medication Assistance  Post Acute Care Choice:  NA Choice offered to:  NA  DME Arranged:  N/A DME Agency:  NA  HH Arranged:  NA HH Agency:  NA  Status of Service:  Completed, signed off  If discussed at [place] of Stay Meetings, dates discussed:    Additional Comments:  Bethena Roys, RN 10/18/2018, 12:05 PM

## 2018-10-18 NOTE — Discharge Summary (Addendum)
Advanced Heart Failure Discharge Note  Discharge Summary   Patient ID: Kim [name] MRN: [id number], DOB/AGE: Mar 20, 1935 84 y.o. Admit date: 10/14/2018 D/C date:     10/18/2018   Primary Discharge Diagnoses:  1. Afib RVR - S/p TEE/DCCV 10/18/18 2. CAD 3. Hyperlipidemia 4. HOCM 5. Severe MR - Noted on TEE this admission. Plan to do outpatient referral to [facility].  Hospital Course:  Kim [name] is a 84 y.o. female with a history of DES RCA 2015, HOCM, HTN, HLDintol statins and Repatha, hypothyroid, breast CA on chemo s/p XRT, HOH.   She presented to [facility] on 10/14/2018 with CP. Troponins were negative. She was found to be in new Afib RVR. She was started on Eliquis and diltiazem. Underwent successful TEE/DCCV on 10/17/2018.   1. Afib with RVR - CHA2DS2-VASc at least 5.  - S/p TEE/DCCV 10/17/2018.  - Continue Eliquis 5 mg BID. $70 copay per CM. - Continue diltiazem 120 mg daily.  - Continue toprol XL 50 mg daily.  2. CAD: Status post PCI for unstable angina in 3/15 with DES to [facility] She is now off Plavix.  - No s/s of ischemia.    - Continue Toprol XL as above.  - ASA stopped with addition of Eliquis. - Unable to tolerate statinsor Repatha, now on Zetia.  3. Hyperlipidemia:  - Myalgias with Crestor, Lipitor, and pravastatin.Myalgias with Repatha. - Continue Zetia 10 mg daily.  4. Hypertrophic obstructive cardiomyopathy: No family history of HOCM or sudden death (interestingly, her husband has HOCM). Most recent echo in 4/18 showed a small LVOT gradient and severe asymmetric septal hypertrophy. Cardiac MRI in 5/15 showed no delayed enhancement.  - Continue current Toprol XL.  - Per patient, her 2 sons have had screening echoes to look for hypertrophic cardiomyopathy and did not show signs of HOCM. - Was not on lasix prior to admit, but now with orthopnea in the setting of recent Afib and severe MR. Started on lasix 40 mg daily. Responded well to 80 mg IV lasix x 1 this  admit.  5. Severe MR - By TEE 10/17/2018 - Per Dr. Aundra Dubin, will discuss referral to Dr. Roxy Manns as an outpatient.  - She is at high risk for recurrent Afib.   She will be followed closely in the HF clinic, with appointment as below. She will need an EKG and BMET at follow up.   Discharge Weight Range: 146.7 lbs.  Discharge Vitals: Blood pressure (!) 141/78, pulse 85, temperature 97.6 F (36.4 C), temperature source Oral, resp. rate 18, height 5\' 8"  (1.727 m), weight 66.5 kg, last menstrual period 10/19/1991, SpO2 98 %.  Labs: Lab Results  Component Value Date   WBC 5.1 10/17/2018   HGB 10.4 (L) 10/17/2018   HCT 33.4 (L) 10/17/2018   MCV 94.6 10/17/2018   PLT 125 (L) 10/17/2018    Recent Labs  Lab 10/14/18 0950  10/18/18 0359  NA 139   < > 138  K 3.9   < > 4.0  CL 110   < > 102  CO2 19*   < > 22  BUN 23   < > 21  CREATININE 1.15*   < > 1.30*  CALCIUM 8.9   < > 9.9  PROT 6.1*  --   --   BILITOT 1.2  --   --   ALKPHOS 68  --   --   ALT 67*  --   --   AST 31  --   --  GLUCOSE 119*   < > 98   < > = values in this interval not displayed.   Lab Results  Component Value Date   CHOL 134 10/15/2018   HDL 28 (L) 10/15/2018   LDLCALC 95 10/15/2018   TRIG 56 10/15/2018   BNP (last 3 results) Recent Labs    10/14/18 0950 10/18/18 0359  BNP 814.4* 327.3*    ProBNP (last 3 results) No results for input(s): PROBNP in the last 8760 hours.   Diagnostic Studies/Procedures   TEE 10/17/18 1. No LA appendage thrombus.  2. Suspect severe MR.  I think the mechanism is most likely mitral valve prolapse.  There appeared to be only minimal SAM.   Discharge Medications   Allergies as of 10/18/2018      Reactions   Levaquin [levofloxacin] Hives   Penicillins Swelling   FACIAL SWELLING PATIENT HAS HAD A PCN REACTION WITH IMMEDIATE RASH, FACIAL/TONGUE/THROAT SWELLING, SOB, OR LIGHTHEADEDNESS WITH HYPOTENSION:  #  #  YES  #  #  Has patient had a PCN reaction causing severe rash  involving mucus membranes or skin necrosis: No Has patient had a PCN reaction that required hospitalization: No Has patient had a PCN reaction occurring within the last 10 years: No   Propoxyphene N-acetaminophen Swelling   tongue swelling   Statins Other (See Comments)   Joint pain   Cortisone Rash   Evolocumab Other (See Comments)   Myalgias, fatigue, some itching and burning on her feet,  Arthralgia (Joint Pain)   Myrbetriq [mirabegron] Rash   Rash on face   Pseudoephedrine Other (See Comments)   Facial flushing      Medication List    STOP taking these medications   aspirin EC 81 MG tablet   methylPREDNISolone 4 MG Tbpk tablet Commonly known as:  MEDROL DOSEPAK     TAKE these medications   apixaban 5 MG Tabs tablet Commonly known as:  ELIQUIS Take 1 tablet (5 mg total) by mouth 2 (two) times daily.   CALCIUM-D PO Take 1 tablet by mouth daily.   diltiazem 120 MG 24 hr capsule Commonly known as:  CARDIZEM CD Take 1 capsule (120 mg total) by mouth daily. Start taking on:  October 19, 2018   exemestane 25 MG tablet Commonly known as:  AROMASIN Take 1 tablet (25 mg total) by mouth daily after breakfast.   ezetimibe 10 MG tablet Commonly known as:  ZETIA TAKE 1 TABLET BY MOUTH ONCE DAILY   furosemide 40 MG tablet Commonly known as:  LASIX Take 1 tablet (40 mg total) by mouth daily. Start taking on:  October 19, 2018   gabapentin 100 MG capsule Commonly known as:  NEURONTIN Take 100 mg by mouth 2 (two) times daily.   HYDROcodone-acetaminophen 5-325 MG tablet Commonly known as:  NORCO/VICODIN Take 1 tablet by mouth every 4 (four) hours as needed (pain).   levothyroxine 137 MCG tablet Commonly known as:  SYNTHROID, LEVOTHROID Take 1 tablet (137 mcg total) by mouth daily before breakfast.   metoprolol succinate 50 MG 24 hr tablet Commonly known as:  TOPROL-XL Take 1 tablet (50 mg total) by mouth daily. Take with or immediately following a meal. Start taking  on:  October 19, 2018 What changed:  See the new instructions.   multivitamin with minerals Tabs tablet Take 1 tablet by mouth daily.   potassium chloride 10 MEQ tablet Commonly known as:  K-DUR Take 2 tablets (20 mEq total) by mouth daily.  Disposition   The patient will be discharged in stable condition to home. Discharge Instructions    (HEART FAILURE PATIENTS) Call MD:  Anytime you have any of the following symptoms: 1) 3 pound weight gain in 24 hours or 5 pounds in 1 week 2) shortness of breath, with or without a dry hacking cough 3) swelling in the hands, feet or stomach 4) if you have to sleep on extra pillows at night in order to breathe.   Complete by:  As directed    Call MD for:  persistant dizziness or light-headedness   Complete by:  As directed    Diet - low sodium heart healthy   Complete by:  As directed    Heart Failure patients record your daily weight using the same scale at the same time of day   Complete by:  As directed    Increase activity slowly   Complete by:  As directed    STOP any activity that causes chest pain, shortness of breath, dizziness, sweating, or exessive weakness   Complete by:  As directed      Follow-up Information    Larey Dresser, MD Follow up on 10/25/2018.   Specialty:  Cardiology Why:  9:20 am. [place] code for January is 0226 Contact information: [place] [address] [phone]             Duration of Discharge Encounter: Greater than 35 minutes   Signed, Shirley Friar, PA-C 10/18/2018, 1:38 PM

## 2018-10-18 NOTE — Progress Notes (Signed)
Pt c/o orthopnea. VSS, O2 sats 95% on room air, expiratory wheezes bilaterally.  Cards on-call paged, order received for 1x 80 IV lasix. Will continue to monitor patient closely.

## 2018-10-18 NOTE — Addendum Note (Signed)
Addendum  created 10/18/18 1117 by Janeece Riggers, MD   [place] recorded in [place], [place] filed

## 2018-10-18 NOTE — Progress Notes (Addendum)
Advanced Heart Failure Rounding Note  PCP-Cardiologist: No primary care provider on file.   Subjective:    Presented to [facility] 10/14/2018 with CP. Troponins negative, but found to be Afib with RVR.   Remains in NSR s/p TEE/DCCV 10/17/2018. Cr and K stable.   Feeling better this am. Had bad orthopnea overnight, and given 80 mg IV lasix with good response and symptomatic relief. Denies lightheadedness or diabetes.  Weight shows down 8 lbs with dose of IV lasix. I/OS inaccurate.   Echo 01/2017 LVEF 55-60%, severe asymmetric septal hypertrophy, Mild MR, Mod/Sev LAE, PA peak pressure 32 mm HG.   Objective:   Weight Range: 66.5 kg Body mass index is 22.31 kg/m.   Vital Signs:   Temp:  [97.6 F (36.4 C)-98.3 F (36.8 C)] 97.6 F (36.4 C) (01/15 0457) Pulse Rate:  [66-95] 85 (01/15 0457) Resp:  [17-19] 18 (01/14 1406) BP: (103-141)/(63-92) 141/78 (01/15 0457) SpO2:  [92 %-100 %] 98 % (01/15 0457) Weight:  [66.5 kg] 66.5 kg (01/15 0457) Last BM Date: 10/16/18  Weight change: Filed Weights   10/15/18 0401 10/16/18 0411 10/18/18 0457  Weight: 69.2 kg 69.9 kg 66.5 kg   Intake/Output:   Intake/Output Summary (Last 24 hours) at 10/18/2018 0948 Last data filed at 10/17/2018 2200 Gross per 24 hour  Intake 669.57 ml  Output -  Net 669.57 ml    Physical Exam    General: NAD HEENT: Normal Neck: Supple. JVP 7-8 cm. Carotids 2+ bilat; no bruits. No thyromegaly or nodule noted. Cor: PMI nondisplaced. Regular with occasional ectopy. No M/G/R noted Lungs: CTAB, normal effort. Abdomen: Soft, non-tender, non-distended, no HSM. No bruits or masses. +BS  Extremities: No cyanosis, clubbing, or rash. R and LLE no edema.  Neuro: Alert & orientedx3, cranial nerves grossly intact. moves all 4 extremities w/o difficulty. Affect pleasant   Telemetry   NSR 70-80s, personally reviewed.   EKG    Pending.   Labs    CBC Recent Labs    10/16/18 0403 10/17/18 0413  WBC 5.7 5.1    NEUTROABS  --  3.4  HGB 11.1* 10.4*  HCT 35.7* 33.4*  MCV 93.2 94.6  PLT 122* 161*   Basic Metabolic Panel Recent Labs    10/16/18 1026 10/17/18 0413 10/18/18 0359  NA  --  137 138  K  --  3.8 4.0  CL  --  109 102  CO2  --  20* 22  GLUCOSE  --  110* 98  BUN  --  18 21  CREATININE  --  1.30* 1.30*  CALCIUM  --  8.9 9.9  MG 2.1  --   --    Liver Function Tests No results for input(s): AST, ALT, ALKPHOS, BILITOT, PROT, ALBUMIN in the last 72 hours. No results for input(s): LIPASE, AMYLASE in the last 72 hours. Cardiac Enzymes No results for input(s): CKTOTAL, CKMB, CKMBINDEX, TROPONINI in the last 72 hours.  BNP: BNP (last 3 results) Recent Labs    10/14/18 0950 10/18/18 0359  BNP 814.4* 327.3*    ProBNP (last 3 results) No results for input(s): PROBNP in the last 8760 hours.   D-Dimer No results for input(s): DDIMER in the last 72 hours. Hemoglobin A1C No results for input(s): HGBA1C in the last 72 hours. Fasting Lipid Panel No results for input(s): CHOL, HDL, LDLCALC, TRIG, CHOLHDL, LDLDIRECT in the last 72 hours. Thyroid Function Tests No results for input(s): TSH, T4TOTAL, T3FREE, THYROIDAB in the last 72 hours.  Invalid input(s): FREET3  Other results:   Imaging    No results found.   Medications:     Scheduled Medications: . apixaban  5 mg Oral BID  . diltiazem  120 mg Oral Daily  . exemestane  25 mg Oral QPC breakfast  . ezetimibe  10 mg Oral Daily  . gabapentin  100 mg Oral BID  . levothyroxine  137 mcg Oral QAC breakfast  . metoprolol succinate  50 mg Oral Daily  . multivitamin with minerals  1 tablet Oral Daily  . sodium chloride flush  3 mL Intravenous Q12H    Infusions: . sodium chloride      PRN Medications: acetaminophen, ALPRAZolam, HYDROcodone-acetaminophen, nitroGLYCERIN, ondansetron (ZOFRAN) IV, sodium chloride flush, zolpidem  Patient Profile   Kim [name] is a 84 y.o. female with a history of DES RCA 2015,  HOCM, HTN, HLDintol statins and Repatha, hypothyroid, breast CA on chemo s/p XRT, HOH.  Admitted with chest pain, negative troponins. Found to have new Afib RVR.   Assessment/Plan   1. Afib with RVR - This patients CHA2DS2-VASc at least 5.  - NSR this am s/p TEE/DCCV 10/18/2018. EKG pending to confirm.  - Started on Eliquis 10/15/2018. Continue Eliquis 5 mg BID.   - Continue diltiazem 120 mg daily.  - Continue toprol XL 50 mg daily.  2. CAD: Status post PCI for unstable angina in 3/15 with [facility] to [facility]. She is now off Plavix.  - No s/s of ischemia.    - Continue Toprol XL as above.  - Will stop ASA with addition of Eliquis. - Unable to tolerate statins or Repatha, now on Zetia.  3. Hyperlipidemia:  - Myalgias with Crestor, Lipitor, and pravastatin.  Myalgias with Repatha. - Continue Zetia 10 mg daily.  - No change.  4. Hypertrophic obstructive cardiomyopathy: No family history of HOCM or sudden death (interestingly, her husband has HOCM).  Most recent echo in 4/18 showed a small LVOT gradient and severe asymmetric septal hypertrophy.  Cardiac MRI in 5/15 showed no delayed enhancement.  - Continue current Toprol XL.  - Per patient, her 2 sons have had screening echoes to look for hypertrophic cardiomyopathy and did not show signs of HOCM.   - Was not on lasix prior to admit, but now with orthopnea in the setting of recent Afib and severe MR. Suspect will need lasix for home.  - Will start 40 mg lasix daily, and have close follow up.  5. Severe MR - By TEE 10/17/2018 - Per Dr. Aundra Dubin, will discuss referral to Dr. Roxy Manns as an outpatient.  - She is at high risk for recurrent Afib.   Medication concerns reviewed with patient and pharmacy team. Barriers identified: None at this time.   Length of Stay: 4  Annamaria Helling  10/18/2018, 9:48 AM  Advanced Heart Failure Team Pager [phone] (M-F; 7a - 4p)  Please contact [facility] for night-coverage after hours (4p -7a ) and  weekends on [email or web address]  Patient seen with PA, agree with the above note.   She remains in NSR today after DCCV yesterday.  Dyspneic overnight, required IV Lasix.  Today, euvolemic on exam and still in NSR.  No dyspnea.   I suspect that mitral regurgitation may be the trigger for atrial fibrillation.  I think the mechanism of MR is most likely mitral valve prolapse. There appeared to be only minimal SAM on TEE.   The left atrium is severely enlarged.  It is possible that  mitral regurgitation may improve in NSR, but it appears severe.  Question at this point will be management of the mitral regurgitation.  MV repair with MAZE would be option, may need septal myectomy as well to prevent LVOT obstruction post-op.  Will let her recover from current episode and will refer to Dr. Roxy Manns as outpatient for MV repair evaluation.   Continue diltiazem CD 120 mg daily in addition to Toprol XL for rate control.  She will be at risk for recurrent atrial fibrillation with severe MR and may need antiarrhythmic in the future. Her atrial fibrillation is symptomatic.   She will go home on Lasix 40 mg daily + KCl 20 to maintain euvolemia in face of severe MR.   [name] 10/18/2018 1:36 PM

## 2018-10-18 NOTE — Plan of Care (Signed)
  Problem: Education: Goal: Knowledge of General Education information will improve Description Including pain rating scale, medication(s)/side effects and non-pharmacologic comfort measures Outcome: Completed/Met   Problem: Health Behavior/Discharge Planning: Goal: Ability to manage health-related needs will improve Outcome: Completed/Met   Problem: Clinical Measurements: Goal: Ability to maintain clinical measurements within normal limits will improve Outcome: Completed/Met Goal: Will remain free from infection Outcome: Completed/Met Goal: Diagnostic test results will improve Outcome: Completed/Met Goal: Cardiovascular complication will be avoided Outcome: Completed/Met   Problem: Activity: Goal: Risk for activity intolerance will decrease Outcome: Completed/Met   Problem: Coping: Goal: Level of anxiety will decrease Outcome: Completed/Met   Problem: Elimination: Goal: Will not experience complications related to bowel motility Outcome: Completed/Met Goal: Will not experience complications related to urinary retention Outcome: Completed/Met   Problem: Pain Managment: Goal: General experience of comfort will improve Outcome: Completed/Met   Problem: Safety: Goal: Ability to remain free from injury will improve Outcome: Completed/Met   Problem: Skin Integrity: Goal: Risk for impaired skin integrity will decrease Outcome: Completed/Met

## 2018-10-20 ENCOUNTER — Telehealth: Payer: Self-pay | Admitting: Family Medicine

## 2018-10-20 NOTE — Telephone Encounter (Signed)
Copied from [place] [id number]. Topic: Quick Communication - Rx Refill/Question >> Oct 20, 2018  9:05 AM Burchel, Abbi R wrote: Medication: levothyroxine (SYNTHROID, LEVOTHROID) 137 MCG tablet   Gerald Stabs (Pharmacist: [place]) needs to confirm a change in the strength of this medication. Please advise.  Preferred Pharmacy: [facility] [place] [address] [place] [address] Phone: [phone] Fax: [phone]

## 2018-10-20 NOTE — Telephone Encounter (Signed)
Pharmacist at [facility] called to verify Synthroid dose.

## 2018-10-24 ENCOUNTER — Ambulatory Visit: Payer: Medicare Other

## 2018-10-24 ENCOUNTER — Ambulatory Visit: Payer: Medicare Other | Admitting: Hematology & Oncology

## 2018-10-24 ENCOUNTER — Other Ambulatory Visit: Payer: Medicare Other

## 2018-10-25 ENCOUNTER — Encounter (HOSPITAL_COMMUNITY): Payer: Self-pay | Admitting: Cardiology

## 2018-10-25 ENCOUNTER — Ambulatory Visit (HOSPITAL_COMMUNITY)
Admission: RE | Admit: 2018-10-25 | Discharge: 2018-10-25 | Disposition: A | Discharge status: Home / Self Care | Payer: Medicare Other | Source: Ambulatory Visit | Attending: Cardiology | Admitting: Cardiology

## 2018-10-25 ENCOUNTER — Encounter (HOSPITAL_COMMUNITY): Payer: Self-pay

## 2018-10-25 VITALS — BP 102/62 | HR 74 | Wt 143.4 lb

## 2018-10-25 DIAGNOSIS — I340 Nonrheumatic mitral (valve) insufficiency: Secondary | ICD-10-CM | POA: Insufficient documentation

## 2018-10-25 DIAGNOSIS — Z955 Presence of coronary angioplasty implant and graft: Secondary | ICD-10-CM | POA: Insufficient documentation

## 2018-10-25 DIAGNOSIS — I421 Obstructive hypertrophic cardiomyopathy: Secondary | ICD-10-CM | POA: Diagnosis not present

## 2018-10-25 DIAGNOSIS — I4819 Other persistent atrial fibrillation: Secondary | ICD-10-CM

## 2018-10-25 DIAGNOSIS — Z8249 Family history of ischemic heart disease and other diseases of the circulatory system: Secondary | ICD-10-CM | POA: Diagnosis not present

## 2018-10-25 DIAGNOSIS — M791 Myalgia, unspecified site: Secondary | ICD-10-CM | POA: Insufficient documentation

## 2018-10-25 DIAGNOSIS — Z7901 Long term (current) use of anticoagulants: Secondary | ICD-10-CM | POA: Diagnosis not present

## 2018-10-25 DIAGNOSIS — I110 Hypertensive heart disease with heart failure: Secondary | ICD-10-CM | POA: Insufficient documentation

## 2018-10-25 DIAGNOSIS — I480 Paroxysmal atrial fibrillation: Secondary | ICD-10-CM | POA: Insufficient documentation

## 2018-10-25 DIAGNOSIS — E039 Hypothyroidism, unspecified: Secondary | ICD-10-CM | POA: Diagnosis not present

## 2018-10-25 DIAGNOSIS — I5032 Chronic diastolic (congestive) heart failure: Secondary | ICD-10-CM | POA: Diagnosis not present

## 2018-10-25 DIAGNOSIS — E785 Hyperlipidemia, unspecified: Secondary | ICD-10-CM | POA: Diagnosis not present

## 2018-10-25 DIAGNOSIS — Z79899 Other long term (current) drug therapy: Secondary | ICD-10-CM | POA: Diagnosis not present

## 2018-10-25 DIAGNOSIS — Z853 Personal history of malignant neoplasm of breast: Secondary | ICD-10-CM | POA: Diagnosis not present

## 2018-10-25 DIAGNOSIS — I2510 Atherosclerotic heart disease of native coronary artery without angina pectoris: Secondary | ICD-10-CM | POA: Insufficient documentation

## 2018-10-25 LAB — BASIC METABOLIC PANEL
Anion gap: 10 (ref 5–15)
BUN: 28 mg/dL — ABNORMAL HIGH (ref 8–23)
CO2: 25 mmol/L (ref 22–32)
Calcium: 9.2 mg/dL (ref 8.9–10.3)
Chloride: 106 mmol/L (ref 98–111)
Creatinine, Ser: 1.27 mg/dL — ABNORMAL HIGH (ref 0.44–1.00)
GFR calc Af Amer: 46 mL/min — ABNORMAL LOW (ref >60)
GFR calc non Af Amer: 40 mL/min — ABNORMAL LOW (ref >60)
Glucose, Bld: 96 mg/dL (ref 70–99)
Potassium: 3.9 mmol/L (ref 3.5–5.1)
Sodium: 141 mmol/L (ref 135–145)

## 2018-10-25 MED ORDER — AMIODARONE HCL 200 MG PO TABS: ORAL_TABLET | ORAL | 3 refills | Status: DC

## 2018-10-25 NOTE — H&P (View-Only) (Signed)
Patient ID: Kim [name], female   DOB: 1935/09/28, 84 y.o.   MRN: [id number] PCP: Dr. Sarajane Jews Oncologist: Dr. Marin Olp Cardiology: Dr. Aundra Dubin  84 y.o. with history of CAD, HCM, paroxysmal atrial fibrillation, and mitral regurgitation presents for followup of HCM, CHF, MR.  Patient had breast cancer in 2015 and received treatment involving Herceptin.  During breast cancer treatment, she developed unstable angina and ended up getting a DES to the mid RCA in 3/15.  Patient additionally has a history of HOCM.  This has been recognized on prior echoes.  She has severe asymmetric basal septal hypertrophy and SAM with LVOT gradient peak 58 mmHg on echo in 3/15 along with moderate MR.  Repeat echo in 7/15 showed LVOT gradient down to 30 mmHg on higher beta blocker.  Repeat echo in 11/15 showed no significant LVOT gradient but SAM still present.  Echo (8/16) showed asymmetric septal hypertrophy, No SAM, no significant LVOT gradient.  Echo 4/18 showed EF 55-60%, small LVOT gradient with severe asymmetric septal hypertrophy, mild MR, PASP 32 mmHg.    She was admitted in 1/20 with symptomatic atrial fibrillation with RVR, this was a new diagnosis.  She was started on Eliquis and cardioverted after TEE back to NSR.  TEE showed severe mitral regurgitation that appeared to be due mostly to posterior leaflet prolapse, there was minimal systolic anterior motion.   She returns for followup today.  She is back in atrial fibrillation but does not feel it this time. Her rate is now controlled.  No chest pain.  No exertional dyspnea since getting back home but she has not bee very active.  No unusual fatigue.  Not lightheaded.   Labs (3/15): K 4.5, creatinine 0.84, LDL 91, HDL 46 Labs (5/15): K 3.9, creatinine 1.1 Labs (12/15): LDL 80, HDL 28, hemoglobin 10.9 Labs (1/16): K 3.7, creatinine 0.8 Labs (2/16): K 3.6, creatinine 1.2, HCT 34.4 Labs (6/16): K 4.1, creatinine 1.24 Labs (7/17): K 4.1, creatinine 1.2, HCT 38.8, LDL  143 Labs (1/18): LDL 142 Labs (5/18): K 4.4, creatinine 1.0 Labs (1/20): K 3.9, creatinine 1.27  ECG (personally reviewed): atrial fibrillation at 104, rightward axis.   PMH: 1. CAD: Unstable angina 3/15 with LHC showing 99% mRCA stenosis, treated with DES to mRCA.  2. Hypothyroidism 3. Diverticulosis 4. HTN 5. H/o TAH/BSO 6. Breast cancer: s/p lumpectomy with lymph node biopsy in 2/15.  4/10 nodes positive.  She was started on docetaxol/carboplatin/Herceptin in 3/15 with plan for 6 cycles chemo.  7. Hypertrophic obstructive cardiomyopathy: Echo (3/15) with severe focal basal septal hypertrophy (22 mm), narrow LV outflow tract with mitral valve SAM and 58 mmHg peak LVOT resting gradient, EF 60-65%, moderate MR, moderate LAE, normal RV, lateral s' 10.4 cm/sec.  Patient says that her 2 grown sons has had echoes to screen for HOCM.  Echo (4/15) with EF 65-70%, severe focal basal septal hypertrophy, LVOT gradient 33 mmHg, SAM was present with moderate MR, normal RV size and systolic function, lateral S' 10.6, GLS -17.5%.  Cardiac MRI (5/15) with EF 65%, moderate asymmetric septal hypertrophy, systolic anterior motion of the mitral valve with moderate MR, there was no delayed enhancement.  Echo (7/15) with EF 65-70%, moderate ASH, peak LVOT gradient 30 mmHg, SAM with moderate MR, moderate to severe LAE.  Echo (11/15) with EF 55-60%, GLS -15%, no significant LVOT gradient, systolic anterior motion of the mitral valve with mild MR, normal RV size and systolic function. Echo (4/14) with EF 55-60%, severe asymmetric septal  hypertrophy, LVOT gradient 20 mmHg, mild-moderate MR, GLS -18%.  - Echo (8/16): EF 60-65%, asymmetric septal hypertrophy, no significant LV outflow tract gradient, mild MR.   - Echo (4/18): EF 55-60%, small LVOT gradient with severe asymmetric septal hypertrophy, mild MR, PASP 32 mmHg.   - TEE (1/20): EF 55-60%, moderate-severe asymmetric septal hypertrophy, no LVOT gradient, normal RV  size and systolic function, severe LAE, severe MR with posterior MV leaflet prolapse and minimal SAM.   8. Hyperlipidemia: Myalgias with atorvastatin, myalgias with Repatha.  9. Atrial fibrillation: Paroxysmal, first noted in 1/20.  10. Mitral regurgitation: Appears severe on 1/20 TEE.  Has posterior leaflet prolapse with minimal SAM.   SH: Married, 2 children, retired, nonsmoker.   FH: No family history of HOCM or sudden death.  There is a family history of CAD.   ROS: All systems reviewed and negative except as per HPI.    Current Outpatient Medications  Medication Sig Dispense Refill  . apixaban (ELIQUIS) 5 MG TABS tablet Take 1 tablet (5 mg total) by mouth 2 (two) times daily. 60 tablet 3  . Calcium Carbonate-Vitamin D (CALCIUM-D PO) Take 1 tablet by mouth daily.    Marland Kitchen diltiazem (CARDIZEM CD) 120 MG 24 hr capsule Take 1 capsule (120 mg total) by mouth daily. 30 capsule 5  . exemestane (AROMASIN) 25 MG tablet Take 1 tablet (25 mg total) by mouth daily after breakfast. 90 tablet 3  . ezetimibe (ZETIA) 10 MG tablet TAKE 1 TABLET BY MOUTH ONCE DAILY 30 tablet 1  . furosemide (LASIX) 40 MG tablet Take 1 tablet (40 mg total) by mouth daily. 30 tablet 5  . gabapentin (NEURONTIN) 100 MG capsule Take 100 mg by mouth 2 (two) times daily.    Marland Kitchen levothyroxine (SYNTHROID, LEVOTHROID) 137 MCG tablet Take 1 tablet (137 mcg total) by mouth daily before breakfast. 90 tablet 3  . metoprolol succinate (TOPROL-XL) 50 MG 24 hr tablet Take 1 tablet (50 mg total) by mouth daily. Take with or immediately following a meal. 30 tablet 5  . Multiple Vitamin (MULTIVITAMIN WITH MINERALS) TABS tablet Take 1 tablet by mouth daily.    . potassium chloride (K-DUR) 10 MEQ tablet Take 2 tablets (20 mEq total) by mouth daily. 60 tablet 5  . amiodarone (PACERONE) 200 MG tablet 400mg  (2 tabs) twice daily for 1 week. 200mg  (1 tab) twice daily for 1 week. Then 200mg  (1 tab) daily. 90 tablet 3   No current facility-administered  medications for this encounter.    BP 102/62   Pulse 74   Wt 65 kg (143 lb 6.4 oz)   LMP 10/19/1991   SpO2 96%   BMI 21.80 kg/m  General: NAD Neck: No JVD, no thyromegaly or thyroid nodule.  Lungs: Clear to auscultation bilaterally with normal respiratory effort. CV: Nondisplaced PMI.  Heart irregular S1/S2, no S3/S4, 2/6 HSM apex.  No peripheral edema.   Abdomen: Soft, nontender, no hepatosplenomegaly, no distention.  Skin: Intact without lesions or rashes.  Neurologic: Alert and oriented x 3.  Psych: Normal affect. Extremities: No clubbing or cyanosis.  HEENT: Normal.   Assessment/Plan: 1. CAD: Status post PCI for unstable angina in 3/15 with DES to [facility]. She is now off Plavix.  ASA stopped when Eliquis begun. No chest pain.  - No ASA, taking Eliquis.    - Unable to tolerate statins or Repatha, she is on Zetia.  2. Hyperlipidemia: Myalgias with Crestor, Lipitor, and pravastatin.  Myalgias with Repatha.  She has tolerated  Zetia 10 mg daily. LDL 95 in 1/20, not at goal but not terrible.  3. Hypertrophic obstructive cardiomyopathy: No family history of HOCM or sudden death (interestingly, her husband has HOCM). Cardiac MRI in 5/15 showed no delayed enhancement.  TEE in 4/18 showed severe asymmetric septal hypertrophy but no LVOT gradient.  There was minimal SAM.   - Continue current Toprol XL and diltiazem CD.  - Per patient, her 2 sons have had screening echoes to look for hypertrophic cardiomyopathy and did not show signs of HOCM.   4. Atrial fibrillation: Noted initially in 1/20, suspect this was triggered by mitral regurgitation and HCM.  She was cardioverted to NSR but is back in atrial fibrillation today.  With severe mitral regurgitation, she will likely not hold NSR for an appreciable time without an antiarrhythmic medication. We discussed Tikosyn and amiodarone today.  She does not want to come into the hospital to start Tikosyn so will use amiodarone.  - Start amiodarone 400 mg  bid x 1 week then 200 mg bid x 1 week then 200 mg daily.  - I will arrange for repeat DCCV next week.  We discussed risks/benefits and she agrees to proceed.  - Continue Toprol XL and diltiazem CD for rate control.  5. Chronic diastolic CHF: Suspect this is triggered by atrial fibrillation and mitral regurgitation.  She looks euvolemic on current Lasix dosing.  - Continue Lasix 40 mg daily.  BMET today.  6. Mitral regurgitation: I suspect that mitral regurgitation may be the trigger for atrial fibrillation.  I think the mechanism of MR is most likely mitral valve prolapse. There appeared to be only minimal SAM on TEE. The left atrium is severely enlarged. MV repair with MAZE would be option, may need septal myectomy as well to prevent LVOT obstruction post-op. I will refer her to Dr. Roxy Manns for evaluation.   Followup in 3 wks with NP/PA.    [name] 10/25/2018

## 2018-10-25 NOTE — Progress Notes (Signed)
Patient ID: Kim [name], female   DOB: 1934/10/19, 84 y.o.   MRN: [id number] PCP: Dr. Sarajane Jews Oncologist: Dr. Marin Olp Cardiology: Dr. Aundra Dubin  84 y.o. with history of CAD, HCM, paroxysmal atrial fibrillation, and mitral regurgitation presents for followup of HCM, CHF, MR.  Patient had breast cancer in 2015 and received treatment involving Herceptin.  During breast cancer treatment, she developed unstable angina and ended up getting a DES to the mid RCA in 3/15.  Patient additionally has a history of HOCM.  This has been recognized on prior echoes.  She has severe asymmetric basal septal hypertrophy and SAM with LVOT gradient peak 58 mmHg on echo in 3/15 along with moderate MR.  Repeat echo in 7/15 showed LVOT gradient down to 30 mmHg on higher beta blocker.  Repeat echo in 11/15 showed no significant LVOT gradient but SAM still present.  Echo (8/16) showed asymmetric septal hypertrophy, No SAM, no significant LVOT gradient.  Echo 4/18 showed EF 55-60%, small LVOT gradient with severe asymmetric septal hypertrophy, mild MR, PASP 32 mmHg.    She was admitted in 1/20 with symptomatic atrial fibrillation with RVR, this was a new diagnosis.  She was started on Eliquis and cardioverted after TEE back to NSR.  TEE showed severe mitral regurgitation that appeared to be due mostly to posterior leaflet prolapse, there was minimal systolic anterior motion.   She returns for followup today.  She is back in atrial fibrillation but does not feel it this time. Her rate is now controlled.  No chest pain.  No exertional dyspnea since getting back home but she has not bee very active.  No unusual fatigue.  Not lightheaded.   Labs (3/15): K 4.5, creatinine 0.84, LDL 91, HDL 46 Labs (5/15): K 3.9, creatinine 1.1 Labs (12/15): LDL 80, HDL 28, hemoglobin 10.9 Labs (1/16): K 3.7, creatinine 0.8 Labs (2/16): K 3.6, creatinine 1.2, HCT 34.4 Labs (6/16): K 4.1, creatinine 1.24 Labs (7/17): K 4.1, creatinine 1.2, HCT 38.8, LDL  143 Labs (1/18): LDL 142 Labs (5/18): K 4.4, creatinine 1.0 Labs (1/20): K 3.9, creatinine 1.27  ECG (personally reviewed): atrial fibrillation at 104, rightward axis.   PMH: 1. CAD: Unstable angina 3/15 with LHC showing 99% mRCA stenosis, treated with DES to mRCA.  2. Hypothyroidism 3. Diverticulosis 4. HTN 5. H/o TAH/BSO 6. Breast cancer: s/p lumpectomy with lymph node biopsy in 2/15.  4/10 nodes positive.  She was started on docetaxol/carboplatin/Herceptin in 3/15 with plan for 6 cycles chemo.  7. Hypertrophic obstructive cardiomyopathy: Echo (3/15) with severe focal basal septal hypertrophy (22 mm), narrow LV outflow tract with mitral valve SAM and 58 mmHg peak LVOT resting gradient, EF 60-65%, moderate MR, moderate LAE, normal RV, lateral s' 10.4 cm/sec.  Patient says that her 2 grown sons has had echoes to screen for HOCM.  Echo (4/15) with EF 65-70%, severe focal basal septal hypertrophy, LVOT gradient 33 mmHg, SAM was present with moderate MR, normal RV size and systolic function, lateral S' 10.6, GLS -17.5%.  Cardiac MRI (5/15) with EF 65%, moderate asymmetric septal hypertrophy, systolic anterior motion of the mitral valve with moderate MR, there was no delayed enhancement.  Echo (7/15) with EF 65-70%, moderate ASH, peak LVOT gradient 30 mmHg, SAM with moderate MR, moderate to severe LAE.  Echo (11/15) with EF 55-60%, GLS -15%, no significant LVOT gradient, systolic anterior motion of the mitral valve with mild MR, normal RV size and systolic function. Echo (4/14) with EF 55-60%, severe asymmetric septal  hypertrophy, LVOT gradient 20 mmHg, mild-moderate MR, GLS -18%.  - Echo (8/16): EF 60-65%, asymmetric septal hypertrophy, no significant LV outflow tract gradient, mild MR.   - Echo (4/18): EF 55-60%, small LVOT gradient with severe asymmetric septal hypertrophy, mild MR, PASP 32 mmHg.   - TEE (1/20): EF 55-60%, moderate-severe asymmetric septal hypertrophy, no LVOT gradient, normal RV  size and systolic function, severe LAE, severe MR with posterior MV leaflet prolapse and minimal SAM.   8. Hyperlipidemia: Myalgias with atorvastatin, myalgias with Repatha.  9. Atrial fibrillation: Paroxysmal, first noted in 1/20.  10. Mitral regurgitation: Appears severe on 1/20 TEE.  Has posterior leaflet prolapse with minimal SAM.   SH: Married, 2 children, retired, nonsmoker.   FH: No family history of HOCM or sudden death.  There is a family history of CAD.   ROS: All systems reviewed and negative except as per HPI.    Current Outpatient Medications  Medication Sig Dispense Refill  . apixaban (ELIQUIS) 5 MG TABS tablet Take 1 tablet (5 mg total) by mouth 2 (two) times daily. 60 tablet 3  . Calcium Carbonate-Vitamin D (CALCIUM-D PO) Take 1 tablet by mouth daily.    Marland Kitchen diltiazem (CARDIZEM CD) 120 MG 24 hr capsule Take 1 capsule (120 mg total) by mouth daily. 30 capsule 5  . exemestane (AROMASIN) 25 MG tablet Take 1 tablet (25 mg total) by mouth daily after breakfast. 90 tablet 3  . ezetimibe (ZETIA) 10 MG tablet TAKE 1 TABLET BY MOUTH ONCE DAILY 30 tablet 1  . furosemide (LASIX) 40 MG tablet Take 1 tablet (40 mg total) by mouth daily. 30 tablet 5  . gabapentin (NEURONTIN) 100 MG capsule Take 100 mg by mouth 2 (two) times daily.    Marland Kitchen levothyroxine (SYNTHROID, LEVOTHROID) 137 MCG tablet Take 1 tablet (137 mcg total) by mouth daily before breakfast. 90 tablet 3  . metoprolol succinate (TOPROL-XL) 50 MG 24 hr tablet Take 1 tablet (50 mg total) by mouth daily. Take with or immediately following a meal. 30 tablet 5  . Multiple Vitamin (MULTIVITAMIN WITH MINERALS) TABS tablet Take 1 tablet by mouth daily.    . potassium chloride (K-DUR) 10 MEQ tablet Take 2 tablets (20 mEq total) by mouth daily. 60 tablet 5  . amiodarone (PACERONE) 200 MG tablet 400mg  (2 tabs) twice daily for 1 week. 200mg  (1 tab) twice daily for 1 week. Then 200mg  (1 tab) daily. 90 tablet 3   No current facility-administered  medications for this encounter.    BP 102/62   Pulse 74   Wt 65 kg (143 lb 6.4 oz)   LMP 10/19/1991   SpO2 96%   BMI 21.80 kg/m  General: NAD Neck: No JVD, no thyromegaly or thyroid nodule.  Lungs: Clear to auscultation bilaterally with normal respiratory effort. CV: Nondisplaced PMI.  Heart irregular S1/S2, no S3/S4, 2/6 HSM apex.  No peripheral edema.   Abdomen: Soft, nontender, no hepatosplenomegaly, no distention.  Skin: Intact without lesions or rashes.  Neurologic: Alert and oriented x 3.  Psych: Normal affect. Extremities: No clubbing or cyanosis.  HEENT: Normal.   Assessment/Plan: 1. CAD: Status post PCI for unstable angina in 3/15 with DES to [facility]. She is now off Plavix.  ASA stopped when Eliquis begun. No chest pain.  - No ASA, taking Eliquis.    - Unable to tolerate statins or Repatha, she is on Zetia.  2. Hyperlipidemia: Myalgias with Crestor, Lipitor, and pravastatin.  Myalgias with Repatha.  She has tolerated  Zetia 10 mg daily. LDL 95 in 1/20, not at goal but not terrible.  3. Hypertrophic obstructive cardiomyopathy: No family history of HOCM or sudden death (interestingly, her husband has HOCM). Cardiac MRI in 5/15 showed no delayed enhancement.  TEE in 4/18 showed severe asymmetric septal hypertrophy but no LVOT gradient.  There was minimal SAM.   - Continue current Toprol XL and diltiazem CD.  - Per patient, her 2 sons have had screening echoes to look for hypertrophic cardiomyopathy and did not show signs of HOCM.   4. Atrial fibrillation: Noted initially in 1/20, suspect this was triggered by mitral regurgitation and HCM.  She was cardioverted to NSR but is back in atrial fibrillation today.  With severe mitral regurgitation, she will likely not hold NSR for an appreciable time without an antiarrhythmic medication. We discussed Tikosyn and amiodarone today.  She does not want to come into the hospital to start Tikosyn so will use amiodarone.  - Start amiodarone 400 mg  bid x 1 week then 200 mg bid x 1 week then 200 mg daily.  - I will arrange for repeat DCCV next week.  We discussed risks/benefits and she agrees to proceed.  - Continue Toprol XL and diltiazem CD for rate control.  5. Chronic diastolic CHF: Suspect this is triggered by atrial fibrillation and mitral regurgitation.  She looks euvolemic on current Lasix dosing.  - Continue Lasix 40 mg daily.  BMET today.  6. Mitral regurgitation: I suspect that mitral regurgitation may be the trigger for atrial fibrillation.  I think the mechanism of MR is most likely mitral valve prolapse. There appeared to be only minimal SAM on TEE. The left atrium is severely enlarged. MV repair with MAZE would be option, may need septal myectomy as well to prevent LVOT obstruction post-op. I will refer her to Dr. Roxy Manns for evaluation.   Followup in 3 wks with NP/PA.    [name] 10/25/2018

## 2018-10-25 NOTE — Patient Instructions (Signed)
Labs done today. We will call you for any abnormal labs.  You have been referred to Dr. Roxy Manns at [facility] for your mitral valve. They will contact you in order to schedule your appointment.   Your physician has recommended that you have a Cardioversion (DCCV). Electrical Cardioversion uses a jolt of electricity to your heart either through paddles or wired patches attached to your chest. This is a controlled, usually prescheduled, procedure. Defibrillation is done under light anesthesia in the hospital, and you usually go home the day of the procedure. This is done to get your heart back into a normal rhythm. You are not awake for the procedure. Please see the instruction sheet given to you today.  START Amiodarone 400mg  (2 tab) twice daily for 1 week. Then amiodarone 200mg  (1 tab) twice daily for 1 week. Then amiodarone 200mg  (1 tab) daily.  Follow up with the Advanced Practice Provider in 3 weeks.

## 2018-10-25 NOTE — H&P (View-Only) (Signed)
Patient ID: Kim [name], female   DOB: [birth year], 84 y.o.   MRN: [id number] PCP: Dr. Sarajane Jews Oncologist: Dr. Marin Olp Cardiology: Dr. Aundra Dubin  84 y.o. with history of CAD, HCM, paroxysmal atrial fibrillation, and mitral regurgitation presents for followup of HCM, CHF, MR.  Patient had breast cancer in 2015 and received treatment involving Herceptin.  During breast cancer treatment, she developed unstable angina and ended up getting a DES to the mid RCA in 3/15.  Patient additionally has a history of HOCM.  This has been recognized on prior echoes.  She has severe asymmetric basal septal hypertrophy and SAM with LVOT gradient peak 58 mmHg on echo in 3/15 along with moderate MR.  Repeat echo in 7/15 showed LVOT gradient down to 30 mmHg on higher beta blocker.  Repeat echo in 11/15 showed no significant LVOT gradient but SAM still present.  Echo (8/16) showed asymmetric septal hypertrophy, No SAM, no significant LVOT gradient.  Echo 4/18 showed EF 55-60%, small LVOT gradient with severe asymmetric septal hypertrophy, mild MR, PASP 32 mmHg.    She was admitted in 1/20 with symptomatic atrial fibrillation with RVR, this was a new diagnosis.  She was started on Eliquis and cardioverted after TEE back to NSR.  TEE showed severe mitral regurgitation that appeared to be due mostly to posterior leaflet prolapse, there was minimal systolic anterior motion.   She returns for followup today.  She is back in atrial fibrillation but does not feel it this time. Her rate is now controlled.  No chest pain.  No exertional dyspnea since getting back home but she has not bee very active.  No unusual fatigue.  Not lightheaded.   Labs (3/15): K 4.5, creatinine 0.84, LDL 91, HDL 46 Labs (5/15): K 3.9, creatinine 1.1 Labs (12/15): LDL 80, HDL 28, hemoglobin 10.9 Labs (1/16): K 3.7, creatinine 0.8 Labs (2/16): K 3.6, creatinine 1.2, HCT 34.4 Labs (6/16): K 4.1, creatinine 1.24 Labs (7/17): K 4.1, creatinine 1.2, HCT 38.8, LDL  143 Labs (1/18): LDL 142 Labs (5/18): K 4.4, creatinine 1.0 Labs (1/20): K 3.9, creatinine 1.27  ECG (personally reviewed): atrial fibrillation at 104, rightward axis.   PMH: 1. CAD: Unstable angina 3/15 with LHC showing 99% mRCA stenosis, treated with DES to mRCA.  2. Hypothyroidism 3. Diverticulosis 4. HTN 5. H/o TAH/BSO 6. Breast cancer: s/p lumpectomy with lymph node biopsy in 2/15.  4/10 nodes positive.  She was started on docetaxol/carboplatin/Herceptin in 3/15 with plan for 6 cycles chemo.  7. Hypertrophic obstructive cardiomyopathy: Echo (3/15) with severe focal basal septal hypertrophy (22 mm), narrow LV outflow tract with mitral valve SAM and 58 mmHg peak LVOT resting gradient, EF 60-65%, moderate MR, moderate LAE, normal RV, lateral s' 10.4 cm/sec.  Patient says that her 2 grown sons has had echoes to screen for HOCM.  Echo (4/15) with EF 65-70%, severe focal basal septal hypertrophy, LVOT gradient 33 mmHg, SAM was present with moderate MR, normal RV size and systolic function, lateral S' 10.6, GLS -17.5%.  Cardiac MRI (5/15) with EF 65%, moderate asymmetric septal hypertrophy, systolic anterior motion of the mitral valve with moderate MR, there was no delayed enhancement.  Echo (7/15) with EF 65-70%, moderate ASH, peak LVOT gradient 30 mmHg, SAM with moderate MR, moderate to severe LAE.  Echo (11/15) with EF 55-60%, GLS -15%, no significant LVOT gradient, systolic anterior motion of the mitral valve with mild MR, normal RV size and systolic function. Echo (4/14) with EF 55-60%, severe asymmetric septal  hypertrophy, LVOT gradient 20 mmHg, mild-moderate MR, GLS -18%.  - Echo (8/16): EF 60-65%, asymmetric septal hypertrophy, no significant LV outflow tract gradient, mild MR.   - Echo (4/18): EF 55-60%, small LVOT gradient with severe asymmetric septal hypertrophy, mild MR, PASP 32 mmHg.   - TEE (1/20): EF 55-60%, moderate-severe asymmetric septal hypertrophy, no LVOT gradient, normal RV  size and systolic function, severe LAE, severe MR with posterior MV leaflet prolapse and minimal SAM.   8. Hyperlipidemia: Myalgias with atorvastatin, myalgias with Repatha.  9. Atrial fibrillation: Paroxysmal, first noted in 1/20.  10. Mitral regurgitation: Appears severe on 1/20 TEE.  Has posterior leaflet prolapse with minimal SAM.   SH: Married, 2 children, retired, nonsmoker.   FH: No family history of HOCM or sudden death.  There is a family history of CAD.   ROS: All systems reviewed and negative except as per HPI.    Current Outpatient Medications  Medication Sig Dispense Refill  . apixaban (ELIQUIS) 5 MG TABS tablet Take 1 tablet (5 mg total) by mouth 2 (two) times daily. 60 tablet 3  . Calcium Carbonate-Vitamin D (CALCIUM-D PO) Take 1 tablet by mouth daily.    Marland Kitchen diltiazem (CARDIZEM CD) 120 MG 24 hr capsule Take 1 capsule (120 mg total) by mouth daily. 30 capsule 5  . exemestane (AROMASIN) 25 MG tablet Take 1 tablet (25 mg total) by mouth daily after breakfast. 90 tablet 3  . ezetimibe (ZETIA) 10 MG tablet TAKE 1 TABLET BY MOUTH ONCE DAILY 30 tablet 1  . furosemide (LASIX) 40 MG tablet Take 1 tablet (40 mg total) by mouth daily. 30 tablet 5  . gabapentin (NEURONTIN) 100 MG capsule Take 100 mg by mouth 2 (two) times daily.    Marland Kitchen levothyroxine (SYNTHROID, LEVOTHROID) 137 MCG tablet Take 1 tablet (137 mcg total) by mouth daily before breakfast. 90 tablet 3  . metoprolol succinate (TOPROL-XL) 50 MG 24 hr tablet Take 1 tablet (50 mg total) by mouth daily. Take with or immediately following a meal. 30 tablet 5  . Multiple Vitamin (MULTIVITAMIN WITH MINERALS) TABS tablet Take 1 tablet by mouth daily.    . potassium chloride (K-DUR) 10 MEQ tablet Take 2 tablets (20 mEq total) by mouth daily. 60 tablet 5  . amiodarone (PACERONE) 200 MG tablet 400mg  (2 tabs) twice daily for 1 week. 200mg  (1 tab) twice daily for 1 week. Then 200mg  (1 tab) daily. 90 tablet 3   No current facility-administered  medications for this encounter.    BP 102/62   Pulse 74   Wt 65 kg (143 lb 6.4 oz)   LMP 10/19/1991   SpO2 96%   BMI 21.80 kg/m  General: NAD Neck: No JVD, no thyromegaly or thyroid nodule.  Lungs: Clear to auscultation bilaterally with normal respiratory effort. CV: Nondisplaced PMI.  Heart irregular S1/S2, no S3/S4, 2/6 HSM apex.  No peripheral edema.   Abdomen: Soft, nontender, no hepatosplenomegaly, no distention.  Skin: Intact without lesions or rashes.  Neurologic: Alert and oriented x 3.  Psych: Normal affect. Extremities: No clubbing or cyanosis.  HEENT: Normal.   Assessment/Plan: 1. CAD: Status post PCI for unstable angina in 3/15 with DES to [facility]. She is now off Plavix.  ASA stopped when Eliquis begun. No chest pain.  - No ASA, taking Eliquis.    - Unable to tolerate statins or Repatha, she is on Zetia.  2. Hyperlipidemia: Myalgias with Crestor, Lipitor, and pravastatin.  Myalgias with Repatha.  She has tolerated  Zetia 10 mg daily. LDL 95 in 1/20, not at goal but not terrible.  3. Hypertrophic obstructive cardiomyopathy: No family history of HOCM or sudden death (interestingly, her husband has HOCM). Cardiac MRI in 5/15 showed no delayed enhancement.  TEE in 4/18 showed severe asymmetric septal hypertrophy but no LVOT gradient.  There was minimal SAM.   - Continue current Toprol XL and diltiazem CD.  - Per patient, her 2 sons have had screening echoes to look for hypertrophic cardiomyopathy and did not show signs of HOCM.   4. Atrial fibrillation: Noted initially in 1/20, suspect this was triggered by mitral regurgitation and HCM.  She was cardioverted to NSR but is back in atrial fibrillation today.  With severe mitral regurgitation, she will likely not hold NSR for an appreciable time without an antiarrhythmic medication. We discussed Tikosyn and amiodarone today.  She does not want to come into the hospital to start Tikosyn so will use amiodarone.  - Start amiodarone 400 mg  bid x 1 week then 200 mg bid x 1 week then 200 mg daily.  - I will arrange for repeat DCCV next week.  We discussed risks/benefits and she agrees to proceed.  - Continue Toprol XL and diltiazem CD for rate control.  5. Chronic diastolic CHF: Suspect this is triggered by atrial fibrillation and mitral regurgitation.  She looks euvolemic on current Lasix dosing.  - Continue Lasix 40 mg daily.  BMET today.  6. Mitral regurgitation: I suspect that mitral regurgitation may be the trigger for atrial fibrillation.  I think the mechanism of MR is most likely mitral valve prolapse. There appeared to be only minimal SAM on TEE. The left atrium is severely enlarged. MV repair with MAZE would be option, may need septal myectomy as well to prevent LVOT obstruction post-op. I will refer her to Dr. Roxy Manns for evaluation.   Followup in 3 wks with NP/PA.    [name] 10/25/2018

## 2018-10-30 ENCOUNTER — Encounter: Payer: Self-pay | Admitting: Thoracic Surgery (Cardiothoracic Vascular Surgery)

## 2018-10-30 ENCOUNTER — Other Ambulatory Visit: Payer: Self-pay

## 2018-10-30 ENCOUNTER — Encounter (HOSPITAL_COMMUNITY): Payer: Self-pay

## 2018-10-30 ENCOUNTER — Institutional Professional Consult (permissible substitution): Payer: Medicare Other | Admitting: Thoracic Surgery (Cardiothoracic Vascular Surgery)

## 2018-10-30 ENCOUNTER — Telehealth (HOSPITAL_COMMUNITY): Payer: Self-pay

## 2018-10-30 VITALS — BP 106/77 | HR 94 | Resp 16 | Ht 68.0 in | Wt 140.0 lb

## 2018-10-30 DIAGNOSIS — I421 Obstructive hypertrophic cardiomyopathy: Secondary | ICD-10-CM | POA: Diagnosis not present

## 2018-10-30 DIAGNOSIS — I340 Nonrheumatic mitral (valve) insufficiency: Secondary | ICD-10-CM

## 2018-10-30 DIAGNOSIS — I4819 Other persistent atrial fibrillation: Secondary | ICD-10-CM | POA: Diagnosis not present

## 2018-10-30 DIAGNOSIS — I480 Paroxysmal atrial fibrillation: Secondary | ICD-10-CM

## 2018-10-30 NOTE — Telephone Encounter (Signed)
Called patient and made her aware of plans for L&R Cath and TEE.  Pt made aware to hold Eliquis starting Wednesday 11/01/18.  Lab appointment 11/01/18.  She was able to repeat back instructions. Called patients [name] as well and made her aware of plan.  She will be present for patient lab appointment and instructions for procedures. Letters printed. Daughter states understanding of all.

## 2018-10-30 NOTE — Progress Notes (Signed)
[facility]  MULTIDISCIPLINARY HEART VALVE CLINIC  CARDIOTHORACIC SURGERY CONSULTATION REPORT  Referring Provider is Larey Dresser, MD PCP is Laurey Morale, MD  Chief Complaint  Patient presents with  . Mitral Regurgitation    SEVERE.Marland KitchenECHO 08/17/18, TEE 10/15/18.Marland KitchenMarland KitchenH/O AF, BREAST CA, CARDIAC STENTS    HPI:  Patient is an 84 year old female with history of coronary artery disease status post PCI and stenting of the right coronary artery in 2015, hypertrophic obstructive cardiomyopathy, and hypertension with hypertensive heart disease who has been referred for surgical consultation to discuss treatment options for management of recently discovered severe symptomatic primary mitral regurgitation and recurrent persistent atrial fibrillation.  Patient states that she has been told she had a heart murmur for most of her life.  There is no previous history of rheumatic fever or rheumatic heart disease.  She developed breast cancer in 2015 and underwent lumpectomy with radiation therapy followed by chemotherapy including Herceptin.  During breast cancer treatment she developed unstable angina.  She underwent catheterization followed by PCI and stenting of the mid right coronary artery using a drug-eluting stent in March 2015.  Echocardiogram performed at that time revealed findings consistent with hypertrophic obstructive cardiomyopathy with severe asymmetric basal septal hypertrophy, systolic anterior motion of mitral valve, and dynamic left ventricular outflow tract obstruction with peak gradient estimated 58 mmHg.  She reportedly had moderate mitral regurgitation at that time.  Follow-up echocardiogram November 2015 revealed no residual gradient across left ventricular outflow tract on beta-blocker therapy.  Most recent follow-up echocardiogram performed April 2018 revealed normal left ventricular systolic function with severe asymmetric septal hypertrophy, mild mitral regurgitation  and a very slight gradient across left ventricular outflow tract.  Over the past 4 to 5 years the patient has remained reasonably active physically although she developed progressive arthritis.  She underwent uncomplicated right total knee replacement in January 2019 in May 2019 she underwent decompressive laminectomy by Dr. Sherwood Gambler.  The patient states that she never completely recovered following her back surgery.  Her gait has remained slightly unstable and she fatigues easily.  However, she remained reasonably stable until approximately 10 days ago when she developed relatively sudden onset of chest discomfort and shortness of breath associated with numbness running down her left arm.  She presented to the hospital where she was found to be in rapid atrial fibrillation.  She ruled out for acute myocardial infarction.  She was anticoagulated using Eliquis and underwent TEE cardioversion back into sinus rhythm.  TEE revealed mitral valve prolapse with severe mitral regurgitation.  She was seen in follow-up in the office by Dr. Aundra Dubin on October 25, 2018 and was notably back in atrial fibrillation.  She was started on amiodarone and repeat cardioversion has been planned for later this week.  Cardiothoracic surgical consultation was requested.  Patient is married and lives locally in [place] well with her husband.  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  She has remained functionally independent and reasonably active physically until the past year.  She admits that she has never completely recovered from her back surgery last May.  She is still ambulatory but she thinks that she may need to get herself a cane for stability.  She has not had any mechanical falls.  She tires easily.  She states that since hospital discharge she has not had any further episodes of chest pain or shortness of breath.  She denies any PND,  orthopnea, or lower  extremity edema.  Past Medical History:  Diagnosis Date  . Allergic rhinitis   . Allergy   . Arthritis    hands  . Breast cancer of upper-outer quadrant of left female breast ([place]) 11/08/13   finished chemo and radiation 2015  . CAD (coronary artery disease)    a. 12/2013 Cath/PCI: LM nl, LAD 20p, 51m, 30d, D1 30p, LCX 20p, 55m, OM1 20, Mo2 40p, RCA 30p, 62m (4.0x16 Promus Premier DES), 20d, RPL 30, RPDA 70p, 30m, EF 65-70%.  . Diverticulosis   . HOH (hard of hearing)    bilateral, wears hearing aids  . Hx of adenomatous colonic polyps 02/1999  . Hyperlipidemia   . Hypertension   . Hypertrophic obstructive cardiomyopathy (HOCM) [facility])    Cardiologist is Dr. [name]  . Hypothyroidism   . Iron deficiency anemia due to chronic blood loss 01/07/2016  . Low back pain    gets ESI from Dr. Rennis Harding  . Malabsorption of iron 01/07/2016  . Myocardial infarction ([place]) 12/2013   very mild-with first chemo -placed stent x1  . Persistent atrial fibrillation with rapid ventricular response 10/14/2018  . Personal history of radiation therapy 2015  . S/P radiation therapy 05/14/2014-06/26/2014   1) Left Breast  / 50 Gy in 25 fractions, 2) Left Supraclavicular fossa/ 47.5 Gy in 25 fractions, 3) Left Posterior Axillary boost / 4.825 Gy in 25 fractions, 4) Left Breast boost / 10 Gy in 5 fractions   . Tubulovillous adenoma of colon 12/2009   with HGD  . Wears hearing aid    left and right     Past Surgical History:  Procedure Laterality Date  . ABDOMINAL HYSTERECTOMY    . ANTERIOR AND POSTERIOR REPAIR N/A 01/17/2015   Procedure: CYSTOCELE REPAIR ;  Surgeon: Princess Bruins, MD;  Location: [facility];  Service: Gynecology;  Laterality: N/A;  . BILATERAL SALPINGOOPHORECTOMY     see Laurin Coder NP for GYN exams  . BREAST BIOPSY Left 10/18/2013  . BREAST BIOPSY Left 10/31/2013  . BREAST LUMPECTOMY Left 11/05/2013  . BREAST LUMPECTOMY WITH NEEDLE LOCALIZATION AND AXILLARY LYMPH NODE DISSECTION Left 11/05/2013    Procedure: LEFT BREAST LUMPECTOMY WITH NEEDLE LOCALIZATION and axillary lymph Node Dissection;  Surgeon: Edward Jolly, MD;  Location: [place];  Service: General;  Laterality: Left;  . BREAST SURGERY  11/2013   left lumpectomy  . CARDIOVERSION N/A 10/17/2018   Procedure: CARDIOVERSION;  Surgeon: Larey Dresser, MD;  Location: [facility] ENDOSCOPY;  Service: Cardiovascular;  Laterality: N/A;  . CATARACT EXTRACTION W/ INTRAOCULAR LENS  IMPLANT, BILATERAL  2012   bilateral  . COLONOSCOPY  11/04/2015   per Dr. Fuller Plan, adenomatous polyps, no repeats planned   . EYE SURGERY  11/22/2006   cataracts, bilateral, intraocular lens implant  . JOINT REPLACEMENT     2019  . LEFT HEART CATHETERIZATION WITH CORONARY ANGIOGRAM N/A 12/19/2013   Procedure: LEFT HEART CATHETERIZATION WITH CORONARY ANGIOGRAM;  Surgeon: Burnell Blanks, MD;  Location: [facility] CATH LAB;  Service: Cardiovascular;  Laterality: N/A;  . lumbar epidural steroid injection     lumber spinal stenosis  . LUMBAR LAMINECTOMY/DECOMPRESSION MICRODISCECTOMY Right 03/01/2018   Procedure: RIGHT LUMBAR FOUR- LUMBAR FIVE LAMINECTOMY/MICRODISCECTOMY;  Surgeon: [name], MD;  Location: [place];  Service: Neurosurgery;  Laterality: Right;  . POLYPECTOMY    . PORTACATH PLACEMENT Right 12/11/2013   Procedure: INSERTION PORT-A-CATH;  Surgeon: Edward Jolly, MD;  Location: [place];  Service: General;  Laterality: Right;  Subclavian Vein;   . TEE WITHOUT CARDIOVERSION N/A 10/17/2018   Procedure: TRANSESOPHAGEAL ECHOCARDIOGRAM (TEE);  Surgeon: Larey Dresser, MD;  Location: [facility] ENDOSCOPY;  Service: Cardiovascular;  Laterality: N/A;  . TOTAL KNEE ARTHROPLASTY Right 10/24/2017   Procedure: TOTAL RIGHT KNEE ARTHROPLASTY;  Surgeon: Gaynelle Arabian, MD;  Location: [facility];  Service: Orthopedics;  Laterality: Right;    Family History  Problem Relation Age of Onset  . Alcohol abuse Father   . Kidney failure Mother    . Colon cancer Neg Hx   . Rectal cancer Neg Hx   . Stomach cancer Neg Hx   . Colon polyps Neg Hx     Social History   Socioeconomic History  . Marital status: Married    Spouse name: Not on file  . Number of children: 2  . Years of education: Not on file  . Highest education level: Not on file  Occupational History  . Occupation: Retired-school system  Social Needs  . Financial resource strain: Not on file  . Food insecurity:    Worry: Not on file    Inability: Not on file  . Transportation needs:    Medical: Not on file    Non-medical: Not on file  Tobacco Use  . Smoking status: Never Smoker  . Smokeless tobacco: Never Used  . Tobacco comment: never used tobacco  Substance and Sexual Activity  . Alcohol use: No    Alcohol/week: 0.0 standard drinks  . Drug use: No  . Sexual activity: Not Currently    Birth control/protection: Post-menopausal  Lifestyle  . Physical activity:    Days per week: Not on file    Minutes per session: Not on file  . Stress: Not on file  Relationships  . Social connections:    Talks on phone: Not on file    Gets together: Not on file    Attends religious service: Not on file    Active member of club or organization: Not on file    Attends meetings of clubs or organizations: Not on file    Relationship status: Not on file  . Intimate partner violence:    Fear of current or ex partner: Not on file    Emotionally abused: Not on file    Physically abused: Not on file    Forced sexual activity: Not on file  Other Topics Concern  . Not on file  Social History Narrative   Lives at home with husband.      Current Outpatient Medications  Medication Sig Dispense Refill  . amiodarone (PACERONE) 200 MG tablet 400mg  (2 tabs) twice daily for 1 week. 200mg  (1 tab) twice daily for 1 week. Then 200mg  (1 tab) daily. (Patient taking differently: Take 400 mg by mouth 2 (two) times daily. 400mg  (2 tabs) twice daily for 1 week. 200mg  (1 tab) twice  daily for 1 week. Then 200mg  (1 tab) daily.) 90 tablet 3  . apixaban (ELIQUIS) 5 MG TABS tablet Take 1 tablet (5 mg total) by mouth 2 (two) times daily. 60 tablet 3  . Calcium Carbonate-Vitamin D (CALCIUM-D PO) Take 1 tablet by mouth daily.    Marland Kitchen diltiazem (CARDIZEM CD) 120 MG 24 hr capsule Take 1 capsule (120 mg total) by mouth daily. 30 capsule 5  . exemestane (AROMASIN) 25 MG tablet Take 1 tablet (25 mg total) by mouth daily after breakfast. 90 tablet 3  . ezetimibe (ZETIA) 10 MG tablet TAKE 1 TABLET BY  MOUTH ONCE DAILY (Patient taking differently: Take 10 mg by mouth daily. ) 30 tablet 1  . furosemide (LASIX) 40 MG tablet Take 1 tablet (40 mg total) by mouth daily. 30 tablet 5  . gabapentin (NEURONTIN) 100 MG capsule Take 100 mg by mouth daily.     Marland Kitchen levothyroxine (SYNTHROID, LEVOTHROID) 137 MCG tablet Take 1 tablet (137 mcg total) by mouth daily before breakfast. 90 tablet 3  . metoprolol succinate (TOPROL-XL) 50 MG 24 hr tablet Take 1 tablet (50 mg total) by mouth daily. Take with or immediately following a meal. 30 tablet 5  . Multiple Vitamin (MULTIVITAMIN WITH MINERALS) TABS tablet Take 1 tablet by mouth daily.    . potassium chloride (K-DUR) 10 MEQ tablet Take 2 tablets (20 mEq total) by mouth daily. 60 tablet 5   No current facility-administered medications for this visit.     Allergies  Allergen Reactions  . Levaquin [Levofloxacin] Hives  . Penicillins Swelling    FACIAL SWELLING PATIENT HAS HAD A PCN REACTION WITH IMMEDIATE RASH, FACIAL/TONGUE/THROAT SWELLING, SOB, OR LIGHTHEADEDNESS WITH HYPOTENSION:  #  #  YES  #  #  Has patient had a PCN reaction causing severe rash involving mucus membranes or skin necrosis: No Has patient had a PCN reaction that required hospitalization: No Has patient had a PCN reaction occurring within the last 10 years: No  . Propoxyphene N-Acetaminophen Swelling    tongue swelling  . Statins Other (See Comments)    Joint pain  . Cortisone Rash  .  Evolocumab Other (See Comments)    Myalgias, fatigue, some itching and burning on her feet,  Arthralgia (Joint Pain)  . Myrbetriq [Mirabegron] Rash    Rash on face  . Pseudoephedrine Other (See Comments)    Facial flushing      Review of Systems:   General:  decreased appetite, decreased energy, no weight gain, no weight loss, no fever  Cardiac:  no chest pain with exertion, + chest pain at rest, +SOB with exertion, + recent episode resting SOB, no PND, no orthopnea, no palpitations, + arrhythmia, + atrial fibrillation, no LE edema, no dizzy spells, no syncope  Respiratory:  + shortness of breath, no home oxygen, no productive cough, no dry cough, no bronchitis, no wheezing, no hemoptysis, no asthma, no pain with inspiration or cough, no sleep apnea, no CPAP at night  GI:   no difficulty swallowing, no reflux, no frequent heartburn, no hiatal hernia, no abdominal pain, no constipation, no diarrhea, no hematochezia, no hematemesis, no melena  GU:   no dysuria,  no frequency, + urinary tract infection, no hematuria, no kidney stones, no kidney disease  Vascular:  no pain suggestive of claudication, no pain in feet, no leg cramps, no varicose veins, no DVT, no non-healing foot ulcer  Neuro:   no stroke, no TIA's, no seizures, no headaches, no temporary blindness one eye,  no slurred speech, + peripheral neuropathy, + chronic pain, + instability of gait, + memory/cognitive dysfunction  Musculoskeletal: + arthritis, no joint swelling, no myalgias, + some difficulty walking, somewhat decreased mobility   Skin:   no rash, no itching, no skin infections, no pressure sores or ulcerations  Psych:   no anxiety, no depression, no nervousness, no unusual recent stress  Eyes:   no blurry vision, no floaters, no recent vision changes, + wears glasses or contacts  ENT:   + hearing loss, no loose or painful teeth, no dentures, last saw dentist October 2019  Hematologic:  +  easy bruising, no abnormal bleeding,  no clotting disorder, no frequent epistaxis  Endocrine:  no diabetes, does not check CBG's at home         Physical Exam:   BP 106/77 (BP Location: Left Arm, Patient Position: Sitting, Cuff Size: Large)   Pulse 94   Resp 16   Ht 5\' 8"  (1.727 m)   Wt 140 lb (63.5 kg)   LMP 10/19/1991   SpO2 97% Comment: ON RA  BMI 21.29 kg/m   General:  Elderly,  well-appearing  HEENT:  Unremarkable   Neck:   no JVD, no bruits, no adenopathy   Chest:   clear to auscultation, symmetrical breath sounds, no wheezes, no rhonchi   CV:   Irregular rate and rhythm, grade III/VI systolicmurmur heard best at apex,  no diastolic murmur  Abdomen:  soft, non-tender, no masses   Extremities:  warm, well-perfused, pulses diminished, no LE edema  Rectal/GU  Deferred  Neuro:   Grossly non-focal and symmetrical throughout  Skin:   Clean and dry, no rashes, no breakdown   Diagnostic Tests:  Transthoracic Echocardiography  Patient:    Kim [name], Kim [name] MR #:       [id number] Study Date: 12/29/2016 Gender:     F Age:        82 Height:     172.7 cm Weight:     73.5 kg BSA:        1.89 m^2 Pt. Status: Room:   ATTENDING    Jenkins Rouge, M.D.  ORDERING     Isaiah Serge  REFERRING    Cecilie Kicks R  SONOGRAPHER  Diamond Nickel  PERFORMING   [facility], Outpatient  cc:  ------------------------------------------------------------------- LV EF: 55% -   60%  ------------------------------------------------------------------- Indications:      Cardiomyopathy - hypertrophic - I42.1.  ------------------------------------------------------------------- History:   PMH:   Congestive heart failure.  ------------------------------------------------------------------- Study Conclusions  - Left ventricle: Turbulence in LVOT SAM hard to appreciate small   LVOT gradient less than 37m/sec. Severe asymetric septal   hypertophy consisant with HOCM. The cavity size was normal.   Systolic function was  normal. The estimated ejection fraction was   in the range of 55% to 60%. Left ventricular diastolic function   parameters were normal. - Mitral valve: There was mild regurgitation. - Left atrium: The atrium was moderately to severely dilated. - Atrial septum: No defect or patent foramen ovale was identified. - Pulmonary arteries: PA peak pressure: 32 mm Hg (S). - Pericardium, extracardiac: A trivial pericardial effusion was   identified.  ------------------------------------------------------------------- Study data:  Comparison was made to the study of 01/07/2015.  Study status:  Routine.  Procedure:  Transthoracic echocardiography. Image quality was adequate.  Study completion:  There were no complications.          Transthoracic echocardiography.  M-mode, complete 2D, spectral Doppler, and color Doppler.  Birthdate: Patient birthdate: 09/18/1935.  Age:  Patient is 85 yr old.  Sex: Gender: female.    BMI: 24.6 kg/m^2.  Blood pressure:     140/84 Patient status:  Outpatient.  Study date:  Study date: 12/29/2016. Study time: 10:02 AM.  Location:  [facility]  -------------------------------------------------------------------  ------------------------------------------------------------------- Left ventricle:  Turbulence in LVOT SAM hard to appreciate small LVOT gradient less than 28m/sec. Severe asymetric septal hypertophy consisant with HOCM. The cavity size was normal. Systolic function was normal. The estimated ejection fraction was in the range of 55% to 60%. The transmitral flow pattern  was normal. The deceleration time of the early transmitral flow velocity was normal. The pulmonary vein flow pattern was normal. The tissue Doppler parameters were normal. Left ventricular diastolic function parameters were normal.  ------------------------------------------------------------------- Aortic valve:   Mildly calcified leaflets.  Doppler:   There was  no stenosis.  ------------------------------------------------------------------- Aorta:  The aorta was normal, not dilated, and non-diseased.  ------------------------------------------------------------------- Mitral valve:   Doppler:  There was mild regurgitation.  ------------------------------------------------------------------- Left atrium:  The atrium was moderately to severely dilated.  ------------------------------------------------------------------- Atrial septum:  No defect or patent foramen ovale was identified.   ------------------------------------------------------------------- Right ventricle:  The cavity size was normal. Wall thickness was normal. Systolic function was normal.  ------------------------------------------------------------------- Pulmonic valve:    Doppler:  There was mild regurgitation.  ------------------------------------------------------------------- Tricuspid valve:   Doppler:  There was mild regurgitation.  ------------------------------------------------------------------- Right atrium:  The atrium was normal in size.  ------------------------------------------------------------------- Pericardium:  A trivial pericardial effusion was identified.  ------------------------------------------------------------------- Systemic veins: Inferior vena cava: The vessel was normal in size. The respirophasic diameter changes were in the normal range (>= 50%), consistent with normal central venous pressure.  ------------------------------------------------------------------- Post procedure conclusions Ascending Aorta:  - The aorta was normal, not dilated, and non-diseased.  ------------------------------------------------------------------- Measurements   Left ventricle                         Value        Reference  LV ID, ED, PLAX chordal        (L)     36.4  mm     43 - 52  LV ID, ES, PLAX chordal        (L)     21.9  mm      23 - 38  LV fx shortening, PLAX chordal         40    %      >=29  LV PW thickness, ED                    16.3  mm     ----------  IVS/LV PW ratio, ED                    1.26         <=1.3  LV e&', lateral                         7.51  cm/s   ----------  LV E/e&', lateral                       6.98         ----------  LV e&', medial                          4.57  cm/s   ----------  LV E/e&', medial                        11.47        ----------  LV e&', average                         6.04  cm/s   ----------  LV E/e&', average  8.68         ----------    Ventricular septum                     Value        Reference  IVS thickness, ED                      20.6  mm     ----------    LVOT                                   Value        Reference  LVOT ID, S                             20    mm     ----------  LVOT area                              3.14  cm^2   ----------    Aorta                                  Value        Reference  Aortic root ID, ED                     29    mm     ----------    Left atrium                            Value        Reference  LA ID, A-P, ES                         50    mm     ----------  LA ID/bsa, A-P                 (H)     2.65  cm/m^2 <=2.2  LA volume, S                           122   ml     ----------  LA volume/bsa, S                       64.7  ml/m^2 ----------  LA volume, ES, 1-p A4C                 128   ml     ----------  LA volume/bsa, ES, 1-p A4C             67.9  ml/m^2 ----------  LA volume, ES, 1-p A2C                 114   ml     ----------  LA volume/bsa, ES, 1-p A2C             60.5  ml/m^2 ----------    Mitral valve  Value        Reference  Mitral E-wave peak velocity            52.4  cm/s   ----------  Mitral A-wave peak velocity            53.4  cm/s   ----------  Mitral deceleration time       (H)     352   ms     150 - 230  Mitral E/A ratio, peak                 1             ----------  Mitral regurg VTI, PISA                216   cm     ----------    Pulmonary arteries                     Value        Reference  PA pressure, S, DP             (H)     32    mm Hg  <=30    Tricuspid valve                        Value        Reference  Tricuspid regurg peak velocity         269   cm/s   ----------  Tricuspid peak RV-RA gradient          29    mm Hg  ----------    Right atrium                           Value        Reference  RA ID, S-I, ES, A4C            (H)     56.9  mm     34 - 49  RA area, ES, A4C                       13.8  cm^2   8.3 - 19.5  RA volume, ES, A/L                     27.9  ml     ----------  RA volume/bsa, ES, A/L                 14.8  ml/m^2 ----------    Systemic veins                         Value        Reference  Estimated CVP                          3     mm Hg  ----------    Right ventricle                        Value        Reference  RV pressure, S, DP             (H)     32    mm Hg  <=30  RV s&', lateral, S  16.2  cm/s   ----------  Legend: (L)  and  (H)  mark values outside specified reference range.  ------------------------------------------------------------------- Prepared and Electronically Authenticated by  Jenkins Rouge, M.D. 2018-03-28T10:47:43   Procedure: TEE  Indication: Atrial fibrillation  Sedation: Per anesthesiology  Findings: Please see echo section for full report.  Normal left ventricular size with moderate-severe asymmetric septal hypertrophy.  There did not appear to be a significant gradient across the aortic valve/LV outflow tract.  Normal right ventricular size and systolic function.  Severe left atrial enlargement, no LA appendage thrombus. Mild right atrial enlargement. No PFO or ASD by color doppler.  Mild TR, peak RV-RA gradient 44 mmHg.  Trileaflet aortic valve with no stenosis or regurgitation.  There was prolapse of the posterior mitral leaflet.  There was minimal  systolic anterior motion of the anterior mitral leaflet.  There was eccentric, anteriorly-directed mitral regurgitation.  Vena contracta 0.6 cm.  Unable to do PISA given eccentricity of jet.  Visually, I think that MR was severe. There was flattening/slight flow reversal in the pulmonary vein systolic doppler signal.   Impression:  1. No LA appendage thrombus.  2. Suspect severe MR.  I think the mechanism is most likely mitral valve prolapse.  There appeared to be only minimal SAM.   [name] 10/17/2018 1:15 PM   Impression:  Patient has mitral valve prolapse with stage D severe symptomatic primary mitral regurgitation.  She presents with a long history of mild symptoms of exertional shortness of breath and recent sudden onset of chest pain and shortness of breath in the setting of presumably new onset persistent atrial fibrillation which has already failed one attempt at DC cardioversion.  Her previous cardiac history is also notable for the presence of coronary artery disease status post PCI and stenting of the right coronary artery in 2015, hypertensive heart disease with severe asymmetric septal hypertrophy and history of dynamic left ventricular outflow tract obstruction in the past that is improved on medical therapy.  I have personally reviewed the patient's recent transesophageal echocardiogram as well as the transthoracic echocardiogram performed April 2018.  Both exams document the presence of moderate to severe left ventricular hypertrophy with significant diastolic dysfunction.  Transesophageal echocardiogram confirmed the presence of severe mitral regurgitation.  There is severe prolapse involving the middle scallop of the posterior leaflet.  There does not appear to be significant systolic anterior motion of the mitral valve nor dynamic left ventricular outflow tract obstruction on recent TEE.  Previous transthoracic echocardiograms clearly documented the presence of both.  I  agree that patient would likely benefit from mitral valve repair and Maze procedure.  Whether or not concomitant septal myomectomy should be performed remains unclear.  Diagnostic catheterization will need to be performed to rule out the presence of significant coronary artery disease.  Risks associated with surgery will be somewhat elevated because of the patient's age and comorbid medical problems.  She seems to be slowing down a bit physically and has struggled with recovery from her back surgery last spring.  Edge-to-edge MitraClip repair could be considered the risks associated with conventional surgery would be prohibitive.   Plan:  The patient and her daughter were counseled at length regarding the indications, risks and potential benefits of mitral valve repair.  The rationale for elective surgery has been explained, including a comparison between surgery and continued medical therapy with close follow-up.  The relative risks and benefits of performing a maze procedure at the time of their surgery was  discussed at length, including the expected likelihood of long term freedom from recurrent symptomatic atrial fibrillation and/or atrial flutter.  The possibility of need for concomitant septal myomectomy and/or coronary artery bypass grafting was discussed.  Risks associated with surgery were discussed as were expectations for the patient's postoperative convalescence.  Alternative treatment options including edge to edge MitraClip repair were discussed if risks associated with conventional surgery are ultimately felt to be prohibitive.  Patient remains interested in proceeding with further diagnostic testing to consider all possible treatment options.  Presently the patient has been scheduled for repeat DC cardioversion later this week.  Under the circumstances I would favor waiting at least a month before interrupting anticoagulation long enough to proceed with diagnostic cardiac catheterization.   Ultimately catheterization will need to be performed.  During the interim I have referred the patient for elective physical therapy consultation to assess the patient's current functional status, frailty, and expectations for recovery following uncomplicated cardiac surgery as it relates to her mobility.  The patient will return to our office for follow-up after she has undergone physical therapy evaluation and diagnostic cardiac catheterization.   I spent in excess of 90 minutes during the conduct of this office consultation and >50% of this time involved direct face-to-face encounter with the patient for counseling and/or coordination of their care.    Valentina Gu. Roxy Manns, MD 10/30/2018 12:55 PM

## 2018-10-30 NOTE — Patient Instructions (Signed)
Continue all previous medications without any changes at this time  

## 2018-11-01 ENCOUNTER — Telehealth (HOSPITAL_COMMUNITY): Payer: Self-pay

## 2018-11-01 ENCOUNTER — Ambulatory Visit (HOSPITAL_COMMUNITY)
Admission: RE | Admit: 2018-11-01 | Discharge: 2018-11-01 | Disposition: A | Discharge status: Home / Self Care | Payer: Medicare Other | Source: Ambulatory Visit | Attending: Internal Medicine | Admitting: Internal Medicine

## 2018-11-01 DIAGNOSIS — I421 Obstructive hypertrophic cardiomyopathy: Secondary | ICD-10-CM | POA: Diagnosis not present

## 2018-11-01 DIAGNOSIS — D500 Iron deficiency anemia secondary to blood loss (chronic): Secondary | ICD-10-CM | POA: Insufficient documentation

## 2018-11-01 LAB — BASIC METABOLIC PANEL
Anion gap: 12 (ref 5–15)
BUN: 24 mg/dL — ABNORMAL HIGH (ref 8–23)
CO2: 23 mmol/L (ref 22–32)
Calcium: 9.5 mg/dL (ref 8.9–10.3)
Chloride: 102 mmol/L (ref 98–111)
Creatinine, Ser: 1.46 mg/dL — ABNORMAL HIGH (ref 0.44–1.00)
GFR calc Af Amer: 39 mL/min — ABNORMAL LOW (ref >60)
GFR calc non Af Amer: 33 mL/min — ABNORMAL LOW (ref >60)
Glucose, Bld: 108 mg/dL — ABNORMAL HIGH (ref 70–99)
Potassium: 4.3 mmol/L (ref 3.5–5.1)
Sodium: 137 mmol/L (ref 135–145)

## 2018-11-01 LAB — CBC
HCT: 43.4 % (ref 36.0–46.0)
Hemoglobin: 13.2 g/dL (ref 12.0–15.0)
MCH: 28.9 pg (ref 26.0–34.0)
MCHC: 30.4 g/dL (ref 30.0–36.0)
MCV: 95 fL (ref 80.0–100.0)
Platelets: 194 10*3/uL (ref 150–400)
RBC: 4.57 MIL/uL (ref 3.87–5.11)
RDW: 13.6 % (ref 11.5–15.5)
WBC: 5.6 10*3/uL (ref 4.0–10.5)
nRBC: 0 % (ref 0.0–0.2)

## 2018-11-01 LAB — PROTIME-INR
INR: 1.47
Prothrombin Time: 17.6 seconds — ABNORMAL HIGH (ref 11.4–15.2)

## 2018-11-01 NOTE — Telephone Encounter (Signed)
Pt seen in office today for pre procedure labs.  Pt presents with her [name]. Pt scheduled for L/R Heart Cath on Friday and TEE next week Wednesday.  Instructions provided for both procedures. Letter with instructions provided. Questions addressed. Both patient and daughter states understanding of all.

## 2018-11-03 ENCOUNTER — Encounter (HOSPITAL_COMMUNITY)
Admission: RE | Disposition: A | Discharge status: Home / Self Care | Payer: Self-pay | Source: Home / Self Care | Attending: Cardiology

## 2018-11-03 ENCOUNTER — Other Ambulatory Visit: Payer: Self-pay

## 2018-11-03 ENCOUNTER — Encounter (HOSPITAL_COMMUNITY): Payer: Self-pay | Admitting: Cardiology

## 2018-11-03 ENCOUNTER — Ambulatory Visit (HOSPITAL_COMMUNITY)
Admission: RE | Admit: 2018-11-03 | Discharge: 2018-11-03 | Disposition: A | Discharge status: Home / Self Care | Payer: Medicare Other | Attending: Cardiology | Admitting: Cardiology

## 2018-11-03 DIAGNOSIS — I110 Hypertensive heart disease with heart failure: Secondary | ICD-10-CM | POA: Diagnosis not present

## 2018-11-03 DIAGNOSIS — Z8249 Family history of ischemic heart disease and other diseases of the circulatory system: Secondary | ICD-10-CM | POA: Insufficient documentation

## 2018-11-03 DIAGNOSIS — E785 Hyperlipidemia, unspecified: Secondary | ICD-10-CM | POA: Diagnosis not present

## 2018-11-03 DIAGNOSIS — I5032 Chronic diastolic (congestive) heart failure: Secondary | ICD-10-CM | POA: Diagnosis not present

## 2018-11-03 DIAGNOSIS — E039 Hypothyroidism, unspecified: Secondary | ICD-10-CM | POA: Insufficient documentation

## 2018-11-03 DIAGNOSIS — I480 Paroxysmal atrial fibrillation: Secondary | ICD-10-CM | POA: Diagnosis not present

## 2018-11-03 DIAGNOSIS — I421 Obstructive hypertrophic cardiomyopathy: Secondary | ICD-10-CM | POA: Insufficient documentation

## 2018-11-03 DIAGNOSIS — Z90710 Acquired absence of both cervix and uterus: Secondary | ICD-10-CM | POA: Diagnosis not present

## 2018-11-03 DIAGNOSIS — Z79899 Other long term (current) drug therapy: Secondary | ICD-10-CM | POA: Diagnosis not present

## 2018-11-03 DIAGNOSIS — Z90722 Acquired absence of ovaries, bilateral: Secondary | ICD-10-CM | POA: Insufficient documentation

## 2018-11-03 DIAGNOSIS — I2510 Atherosclerotic heart disease of native coronary artery without angina pectoris: Secondary | ICD-10-CM | POA: Insufficient documentation

## 2018-11-03 DIAGNOSIS — Z7901 Long term (current) use of anticoagulants: Secondary | ICD-10-CM | POA: Insufficient documentation

## 2018-11-03 DIAGNOSIS — M791 Myalgia, unspecified site: Secondary | ICD-10-CM | POA: Diagnosis not present

## 2018-11-03 DIAGNOSIS — Z7902 Long term (current) use of antithrombotics/antiplatelets: Secondary | ICD-10-CM | POA: Insufficient documentation

## 2018-11-03 DIAGNOSIS — Z79890 Hormone replacement therapy (postmenopausal): Secondary | ICD-10-CM | POA: Insufficient documentation

## 2018-11-03 DIAGNOSIS — Z955 Presence of coronary angioplasty implant and graft: Secondary | ICD-10-CM | POA: Insufficient documentation

## 2018-11-03 DIAGNOSIS — I051 Rheumatic mitral insufficiency: Secondary | ICD-10-CM | POA: Insufficient documentation

## 2018-11-03 DIAGNOSIS — I340 Nonrheumatic mitral (valve) insufficiency: Secondary | ICD-10-CM | POA: Diagnosis not present

## 2018-11-03 HISTORY — PX: RIGHT/LEFT HEART CATH AND CORONARY ANGIOGRAPHY: CATH118266

## 2018-11-03 LAB — POCT I-STAT EG7
Bicarbonate: 25.3 mmol/L (ref 20.0–28.0)
Bicarbonate: 26.1 mmol/L (ref 20.0–28.0)
Calcium, Ion: 1.12 mmol/L — ABNORMAL LOW (ref 1.15–1.40)
Calcium, Ion: 1.2 mmol/L (ref 1.15–1.40)
HCT: 35 % — ABNORMAL LOW (ref 36.0–46.0)
HCT: 36 % (ref 36.0–46.0)
Hemoglobin: 11.9 g/dL — ABNORMAL LOW (ref 12.0–15.0)
Hemoglobin: 12.2 g/dL (ref 12.0–15.0)
O2 Saturation: 70 %
O2 Saturation: 72 %
Potassium: 3.7 mmol/L (ref 3.5–5.1)
Potassium: 3.9 mmol/L (ref 3.5–5.1)
Sodium: 141 mmol/L (ref 135–145)
Sodium: 142 mmol/L (ref 135–145)
TCO2: 27 mmol/L (ref 22–32)
TCO2: 27 mmol/L (ref 22–32)
pCO2, Ven: 43.3 mmHg — ABNORMAL LOW (ref 44.0–60.0)
pCO2, Ven: 45.2 mmHg (ref 44.0–60.0)
pH, Ven: 7.369 (ref 7.250–7.430)
pH, Ven: 7.375 (ref 7.250–7.430)
pO2, Ven: 38 mmHg (ref 32.0–45.0)
pO2, Ven: 39 mmHg (ref 32.0–45.0)

## 2018-11-03 LAB — POCT ACTIVATED CLOTTING TIME: Activated Clotting Time: 197 seconds

## 2018-11-03 SURGERY — RIGHT/LEFT HEART CATH AND CORONARY ANGIOGRAPHY
Anesthesia: LOCAL

## 2018-11-03 MED ORDER — HEPARIN SODIUM (PORCINE) 1000 UNIT/ML IJ SOLN
INTRAMUSCULAR | Status: AC
  Filled 2018-11-03: qty 1

## 2018-11-03 MED ORDER — ONDANSETRON HCL 4 MG/2ML IJ SOLN: 4.0000 mg | Freq: Four times a day (QID) | INTRAMUSCULAR | Status: DC | PRN

## 2018-11-03 MED ORDER — HEPARIN SODIUM (PORCINE) 1000 UNIT/ML IJ SOLN
INTRAMUSCULAR | Status: DC | PRN
  Administered 2018-11-03: 3000 [IU] via INTRAVENOUS

## 2018-11-03 MED ORDER — HEPARIN (PORCINE) IN NACL 1000-0.9 UT/500ML-% IV SOLN
INTRAVENOUS | Status: DC | PRN
  Administered 2018-11-03: 500 mL

## 2018-11-03 MED ORDER — FENTANYL CITRATE (PF) 100 MCG/2ML IJ SOLN
INTRAMUSCULAR | Status: DC | PRN
  Administered 2018-11-03 (×2): 25 ug via INTRAVENOUS

## 2018-11-03 MED ORDER — HEPARIN (PORCINE) IN NACL 1000-0.9 UT/500ML-% IV SOLN
INTRAVENOUS | Status: AC
  Filled 2018-11-03: qty 1000

## 2018-11-03 MED ORDER — SODIUM CHLORIDE 0.9 % IV SOLN: 250.0000 mL | INTRAVENOUS | Status: DC | PRN

## 2018-11-03 MED ORDER — LIDOCAINE HCL (PF) 1 % IJ SOLN
INTRAMUSCULAR | Status: AC
  Filled 2018-11-03: qty 30

## 2018-11-03 MED ORDER — MIDAZOLAM HCL 2 MG/2ML IJ SOLN
INTRAMUSCULAR | Status: DC | PRN
  Administered 2018-11-03 (×2): 1 mg via INTRAVENOUS

## 2018-11-03 MED ORDER — MIDAZOLAM HCL 2 MG/2ML IJ SOLN
INTRAMUSCULAR | Status: AC
  Filled 2018-11-03: qty 2

## 2018-11-03 MED ORDER — LIDOCAINE HCL (PF) 1 % IJ SOLN
INTRAMUSCULAR | Status: DC | PRN
  Administered 2018-11-03: 2 mL
  Administered 2018-11-03: 15 mL
  Administered 2018-11-03: 2 mL

## 2018-11-03 MED ORDER — VERAPAMIL HCL 2.5 MG/ML IV SOLN
INTRAVENOUS | Status: AC
  Filled 2018-11-03: qty 2

## 2018-11-03 MED ORDER — SODIUM CHLORIDE 0.9% FLUSH: 3.0000 mL | INTRAVENOUS | Status: DC | PRN

## 2018-11-03 MED ORDER — FENTANYL CITRATE (PF) 100 MCG/2ML IJ SOLN
INTRAMUSCULAR | Status: AC
  Filled 2018-11-03: qty 2

## 2018-11-03 MED ORDER — IOHEXOL 350 MG/ML SOLN
INTRAVENOUS | Status: DC | PRN
  Administered 2018-11-03: 45 mL via INTRA_ARTERIAL

## 2018-11-03 MED ORDER — SODIUM CHLORIDE 0.9 % IV SOLN
INTRAVENOUS | Status: DC
  Administered 2018-11-03: 06:00:00 via INTRAVENOUS

## 2018-11-03 MED ORDER — ASPIRIN 81 MG PO CHEW
81.0000 mg | CHEWABLE_TABLET | ORAL | Status: AC
  Administered 2018-11-03: 81 mg via ORAL
  Filled 2018-11-03: qty 1

## 2018-11-03 MED ORDER — ACETAMINOPHEN 325 MG PO TABS: 650.0000 mg | ORAL_TABLET | ORAL | Status: DC | PRN

## 2018-11-03 MED ORDER — SODIUM CHLORIDE 0.9% FLUSH: 3.0000 mL | Freq: Two times a day (BID) | INTRAVENOUS | Status: DC

## 2018-11-03 MED ORDER — VERAPAMIL HCL 2.5 MG/ML IV SOLN
INTRAVENOUS | Status: DC | PRN
  Administered 2018-11-03: 08:00:00 via INTRA_ARTERIAL

## 2018-11-03 MED ORDER — SODIUM CHLORIDE 0.9 % WEIGHT BASED INFUSION: 1.0000 mL/kg/h | INTRAVENOUS | Status: DC

## 2018-11-03 SURGICAL SUPPLY — 14 items
CATH 5FR JL3.5 JR4 ANG PIG MP (CATHETERS) ×1 IMPLANT
CATH BALLN WEDGE 5F 110CM (CATHETERS) ×1 IMPLANT
DEVICE RAD COMP TR BAND LRG (VASCULAR PRODUCTS) ×1 IMPLANT
GLIDESHEATH SLEND SS 6F .021 (SHEATH) ×1 IMPLANT
GUIDEWIRE .025 260CM (WIRE) ×1 IMPLANT
GUIDEWIRE INQWIRE 1.5J.035X260 (WIRE) IMPLANT
INQWIRE 1.5J .035X260CM (WIRE) ×2
KIT HEART LEFT (KITS) ×2 IMPLANT
PACK CARDIAC CATHETERIZATION (CUSTOM PROCEDURE TRAY) ×2 IMPLANT
SHEATH GLIDE SLENDER 4/5FR (SHEATH) ×1 IMPLANT
SHEATH PINNACLE 7F 10CM (SHEATH) ×1 IMPLANT
SHEATH PROBE COVER 6X72 (BAG) ×1 IMPLANT
TRANSDUCER W/STOPCOCK (MISCELLANEOUS) ×2 IMPLANT
TUBING CIL FLEX 10 FLL-RA (TUBING) ×2 IMPLANT

## 2018-11-03 NOTE — Interval H&P Note (Signed)
History and Physical Interval Note:  11/03/2018 7:46 AM  Kim [name]  has presented today for surgery, with the diagnosis of Mitral Valve Regurg  The various methods of treatment have been discussed with the patient and family. After consideration of risks, benefits and other options for treatment, the patient has consented to  Procedure(s): RIGHT/LEFT HEART CATH AND CORONARY ANGIOGRAPHY (N/A) as a surgical intervention .  The patient's history has been reviewed, patient examined, no change in status, stable for surgery.  I have reviewed the patient's chart and labs.  Questions were answered to the patient's satisfaction.     Jagar Lua [employer]

## 2018-11-03 NOTE — Discharge Instructions (Signed)
Restart apixaban this evening, 8 pm.    Radial Site Care  This sheet gives you information about how to care for yourself after your procedure. Your health care provider may also give you more specific instructions. If you have problems or questions, contact your health care provider. What can I expect after the procedure? After the procedure, it is common to have:  Bruising and tenderness at the catheter insertion area. Follow these instructions at home: Medicines  Take over-the-counter and prescription medicines only as told by your health care provider. Insertion site care  Follow instructions from your health care provider about how to take care of your insertion site. Make sure you: ? Wash your hands with soap and water before you change your bandage (dressing). If soap and water are not available, use hand sanitizer. ? Remove your dressing as told by your health care provider. In 24-48 hours  Check your insertion site every day for signs of infection. Check for: ? Redness, swelling, or pain. ? Fluid or blood. ? Pus or a bad smell. ? Warmth.  Do not take baths, swim, or use a hot tub until your health care provider approves.  You may shower 24-48 hours after the procedure, or as directed by your health care provider. ? Remove the dressing and gently wash the site with plain soap and water. ? Pat the area dry with a clean towel. ? Do not rub the site. That could cause bleeding.  Do not apply powder or lotion to the site. Activity   For 24 hours after the procedure, or as directed by your health care provider: ? Do not flex or bend the affected arm. ? Do not push or pull heavy objects with the affected arm. ? Do not drive yourself home from the hospital or clinic. You may drive 24 hours after the procedure unless your health care provider tells you not to. ? Do not operate machinery or power tools.  Do not lift anything that is heavier than 10 lb (4.5 kg), or the limit  that you are told, until your health care provider says that it is safe. For 5 days  Ask your health care provider when it is okay to: ? Return to work or school. ? Resume usual physical activities or sports. ? Resume sexual activity. General instructions  If the catheter site starts to bleed, raise your arm and put firm pressure on the site. If the bleeding does not stop, get help right away. This is a medical emergency.  If you went home on the same day as your procedure, a responsible adult should be with you for the first 24 hours after you arrive home.  Keep all follow-up visits as told by your health care provider. This is important. Contact a health care provider if:  You have a fever.  You have redness, swelling, or yellow drainage around your insertion site. Get help right away if:  You have unusual pain at the radial site.  The catheter insertion area swells very fast.  The insertion area is bleeding, and the bleeding does not stop when you hold steady pressure on the area.  Your arm or hand becomes pale, cool, tingly, or numb. These symptoms may represent a serious problem that is an emergency. Do not wait to see if the symptoms will go away. Get medical help right away. Call your local emergency services (911 in the [place] Do not drive yourself to the hospital. Summary  After the procedure, it  is common to have bruising and tenderness at the site.  Follow instructions from your health care provider about how to take care of your radial site wound. Check the wound every day for signs of infection.  Do not lift anything that is heavier than 10 lb (4.5 kg), or the limit that you are told, until your health care provider says that it is safe. This information is not intended to replace advice given to you by your health care provider. Make sure you discuss any questions you have with your health care provider. Document Released: 10/23/2010 Document Revised: 10/26/2017  Document Reviewed: 10/26/2017 Elsevier Interactive Patient Education  2019 [employer].  Femoral Site Care This sheet gives you information about how to care for yourself after your procedure. Your health care provider may also give you more specific instructions. If you have problems or questions, contact your health care provider. What can I expect after the procedure? After the procedure, it is common to have:  Bruising that usually fades within 1-2 weeks.  Tenderness at the site. Follow these instructions at home: Wound care  Follow instructions from your health care provider about how to take care of your insertion site. Make sure you: ? Wash your hands with soap and water before you change your bandage (dressing). If soap and water are not available, use hand sanitizer. ? Remove your dressing as told by your health care provider. In 24 hours   Do not take baths, swim, or use a hot tub until your health care provider approves.  You may shower 24-48 hours after the procedure or as told by your health care provider. ? Gently wash the site with plain soap and water. ? Pat the area dry with a clean towel. ? Do not rub the site. This may cause bleeding.  Do not apply powder or lotion to the site. Keep the site clean and dry.  Check your femoral site every day for signs of infection. Check for: ? Redness, swelling, or pain. ? Fluid or blood. ? Warmth. ? Pus or a bad smell. Activity  For the first 2 days after your procedure, or as long as directed: ? Avoid climbing stairs as much as possible. ? Do not squat.  Do not lift anything that is heavier than 10 lb (4.5 kg), or the limit that you are told, until your health care provider says that it is safe.  Rest as directed. ? Avoid sitting for a long time without moving. Get up to take short walks every 1-2 hours.  Do not drive for 24 hours if you were given a medicine to help you relax (sedative). General instructions  Take  over-the-counter and prescription medicines only as told by your health care provider.  Keep all follow-up visits as told by your health care provider. This is important. Contact a health care provider if you have:  A fever or chills.  You have redness, swelling, or pain around your insertion site. Get help right away if:  The catheter insertion area swells very fast.  You pass out.  You suddenly start to sweat or your skin gets clammy.  The catheter insertion area is bleeding, and the bleeding does not stop when you hold steady pressure on the area.  The area near or just beyond the catheter insertion site becomes pale, cool, tingly, or numb. These symptoms may represent a serious problem that is an emergency. Do not wait to see if the symptoms will go away. Get medical  help right away. Call your local emergency services (911 in the [place] Do not drive yourself to the hospital. Summary  After the procedure, it is common to have bruising that usually fades within 1-2 weeks.  Check your femoral site every day for signs of infection.  Do not lift anything that is heavier than 10 lb (4.5 kg), or the limit that you are told, until your health care provider says that it is safe. This information is not intended to replace advice given to you by your health care provider. Make sure you discuss any questions you have with your health care provider. Document Released: 05/24/2014 Document Revised: 10/03/2017 Document Reviewed: 10/03/2017 Elsevier Interactive Patient Education  2019 [employer].

## 2018-11-06 ENCOUNTER — Other Ambulatory Visit (HOSPITAL_COMMUNITY): Payer: Self-pay

## 2018-11-06 ENCOUNTER — Other Ambulatory Visit: Payer: Self-pay

## 2018-11-06 MED ORDER — APIXABAN 5 MG PO TABS: 5.0000 mg | ORAL_TABLET | Freq: Two times a day (BID) | ORAL | 3 refills | Status: DC

## 2018-11-08 ENCOUNTER — Ambulatory Visit (HOSPITAL_COMMUNITY)
Admission: RE | Admit: 2018-11-08 | Discharge: 2018-11-08 | Disposition: A | Discharge status: Home / Self Care | Payer: Medicare Other | Attending: Cardiology | Admitting: Cardiology

## 2018-11-08 ENCOUNTER — Encounter (HOSPITAL_COMMUNITY): Payer: Self-pay | Admitting: *Deleted

## 2018-11-08 ENCOUNTER — Other Ambulatory Visit: Payer: Self-pay

## 2018-11-08 ENCOUNTER — Ambulatory Visit (HOSPITAL_COMMUNITY): Payer: Medicare Other | Admitting: Anesthesiology

## 2018-11-08 ENCOUNTER — Encounter (HOSPITAL_COMMUNITY)
Admission: RE | Disposition: A | Discharge status: Home / Self Care | Payer: Self-pay | Source: Home / Self Care | Attending: Cardiology

## 2018-11-08 ENCOUNTER — Ambulatory Visit (HOSPITAL_BASED_OUTPATIENT_CLINIC_OR_DEPARTMENT_OTHER)
Admission: RE | Admit: 2018-11-08 | Discharge: 2018-11-08 | Disposition: A | Discharge status: Home / Self Care | Payer: Medicare Other | Source: Home / Self Care | Attending: Cardiology | Admitting: Cardiology

## 2018-11-08 DIAGNOSIS — Z79899 Other long term (current) drug therapy: Secondary | ICD-10-CM | POA: Diagnosis not present

## 2018-11-08 DIAGNOSIS — Z955 Presence of coronary angioplasty implant and graft: Secondary | ICD-10-CM | POA: Diagnosis not present

## 2018-11-08 DIAGNOSIS — Z888 Allergy status to other drugs, medicaments and biological substances status: Secondary | ICD-10-CM | POA: Insufficient documentation

## 2018-11-08 DIAGNOSIS — Z853 Personal history of malignant neoplasm of breast: Secondary | ICD-10-CM | POA: Insufficient documentation

## 2018-11-08 DIAGNOSIS — I421 Obstructive hypertrophic cardiomyopathy: Secondary | ICD-10-CM | POA: Diagnosis not present

## 2018-11-08 DIAGNOSIS — E039 Hypothyroidism, unspecified: Secondary | ICD-10-CM | POA: Insufficient documentation

## 2018-11-08 DIAGNOSIS — Z7901 Long term (current) use of anticoagulants: Secondary | ICD-10-CM | POA: Insufficient documentation

## 2018-11-08 DIAGNOSIS — I110 Hypertensive heart disease with heart failure: Secondary | ICD-10-CM | POA: Insufficient documentation

## 2018-11-08 DIAGNOSIS — I480 Paroxysmal atrial fibrillation: Secondary | ICD-10-CM | POA: Insufficient documentation

## 2018-11-08 DIAGNOSIS — E785 Hyperlipidemia, unspecified: Secondary | ICD-10-CM | POA: Diagnosis not present

## 2018-11-08 DIAGNOSIS — I340 Nonrheumatic mitral (valve) insufficiency: Secondary | ICD-10-CM | POA: Insufficient documentation

## 2018-11-08 DIAGNOSIS — Z79890 Hormone replacement therapy (postmenopausal): Secondary | ICD-10-CM | POA: Insufficient documentation

## 2018-11-08 DIAGNOSIS — I5032 Chronic diastolic (congestive) heart failure: Secondary | ICD-10-CM | POA: Insufficient documentation

## 2018-11-08 DIAGNOSIS — Z8249 Family history of ischemic heart disease and other diseases of the circulatory system: Secondary | ICD-10-CM | POA: Diagnosis not present

## 2018-11-08 DIAGNOSIS — Z880 Allergy status to penicillin: Secondary | ICD-10-CM | POA: Diagnosis not present

## 2018-11-08 DIAGNOSIS — Z881 Allergy status to other antibiotic agents status: Secondary | ICD-10-CM | POA: Insufficient documentation

## 2018-11-08 DIAGNOSIS — I4891 Unspecified atrial fibrillation: Secondary | ICD-10-CM | POA: Diagnosis not present

## 2018-11-08 DIAGNOSIS — I2510 Atherosclerotic heart disease of native coronary artery without angina pectoris: Secondary | ICD-10-CM | POA: Diagnosis not present

## 2018-11-08 DIAGNOSIS — Z79811 Long term (current) use of aromatase inhibitors: Secondary | ICD-10-CM | POA: Diagnosis not present

## 2018-11-08 HISTORY — PX: TEE WITHOUT CARDIOVERSION: SHX5443

## 2018-11-08 HISTORY — PX: CARDIOVERSION: SHX1299

## 2018-11-08 SURGERY — CARDIOVERSION
Anesthesia: General

## 2018-11-08 MED ORDER — PROPOFOL 10 MG/ML IV BOLUS
INTRAVENOUS | Status: DC | PRN
  Administered 2018-11-08: 30 mg via INTRAVENOUS

## 2018-11-08 MED ORDER — LACTATED RINGERS IV SOLN
INTRAVENOUS | Status: DC | PRN
  Administered 2018-11-08: 14:00:00 via INTRAVENOUS

## 2018-11-08 MED ORDER — PHENYLEPHRINE 40 MCG/ML (10ML) SYRINGE FOR IV PUSH (FOR BLOOD PRESSURE SUPPORT)
PREFILLED_SYRINGE | INTRAVENOUS | Status: DC | PRN
  Administered 2018-11-08: 80 ug via INTRAVENOUS

## 2018-11-08 MED ORDER — PROPOFOL 500 MG/50ML IV EMUL
INTRAVENOUS | Status: DC | PRN
  Administered 2018-11-08: 100 ug/kg/min via INTRAVENOUS

## 2018-11-08 NOTE — Transfer of Care (Signed)
Immediate Anesthesia Transfer of Care Note  Patient: Kim [name]  Procedure(s) Performed: CARDIOVERSION (N/A ) TRANSESOPHAGEAL ECHOCARDIOGRAM (TEE) (N/A )  Patient Location: Endoscopy Unit  Anesthesia Type:MAC  Level of Consciousness: drowsy and patient cooperative  Airway & Oxygen Therapy: Patient Spontanous Breathing and Patient connected to nasal cannula oxygen  Post-op Assessment: Report given to RN, Post -op Vital signs reviewed and stable and Patient moving all extremities X 4  Post vital signs: Reviewed and stable  Last Vitals:  Vitals Value Taken Time  BP    Temp    Pulse    Resp    SpO2      Last Pain:  Vitals:   11/08/18 1335  TempSrc:   PainSc: 0-No pain         Complications: No apparent anesthesia complications

## 2018-11-08 NOTE — Interval H&P Note (Signed)
History and Physical Interval Note:  11/08/2018 1:40 PM  Kim [name]  has presented today for surgery, with the diagnosis of a fib  The various methods of treatment have been discussed with the patient and family. After consideration of risks, benefits and other options for treatment, the patient has consented to  Procedure(s): CARDIOVERSION (N/A) TRANSESOPHAGEAL ECHOCARDIOGRAM (TEE) (N/A) as a surgical intervention .  The patient's history has been reviewed, patient examined, no change in status, stable for surgery.  I have reviewed the patient's chart and labs.  Questions were answered to the patient's satisfaction.     Kim [name]

## 2018-11-08 NOTE — Procedures (Signed)
Electrical Cardioversion Procedure Note Kim [name] [id number] November 17, 1934  Procedure: Electrical Cardioversion Indications:  Atrial Fibrillation  Procedure Details Consent: Risks of procedure as well as the alternatives and risks of each were explained to the (patient/caregiver).  Consent for procedure obtained. Time Out: Verified patient identification, verified procedure, site/side was marked, verified correct patient position, special equipment/implants available, medications/allergies/relevent history reviewed, required imaging and test results available.  Performed  Patient placed on cardiac monitor, pulse oximetry, supplemental oxygen as necessary.  Sedation given: Propofol per anesthesiology Pacer pads placed anterior and posterior chest.  Cardioverted 1 time(s).  Cardioverted at [place].  Evaluation Findings: Post procedure EKG shows: NSR Complications: None Patient did tolerate procedure well.   [name] 11/08/2018, 1:54 PM

## 2018-11-08 NOTE — Progress Notes (Signed)
  Echocardiogram Echocardiogram Transesophageal has been performed.  Kim [name] 11/08/2018, 2:02 PM

## 2018-11-08 NOTE — Anesthesia Preprocedure Evaluation (Addendum)
Anesthesia Evaluation  Patient identified by MRN, date of birth, ID band Patient awake    Reviewed: Allergy & Precautions, H&P , NPO status , Patient's Chart, lab work & pertinent test results, reviewed documented beta blocker date and time   Airway Mallampati: II  TM Distance: >3 FB Neck ROM: Full    Dental no notable dental hx. (+) Teeth Intact, Dental Advisory Given   Pulmonary neg pulmonary ROS,    Pulmonary exam normal breath sounds clear to auscultation       Cardiovascular hypertension, Pt. on medications and Pt. on home beta blockers + CAD, + Past MI and + Cardiac Stents  + dysrhythmias Atrial Fibrillation  Rhythm:Regular Rate:Normal     Neuro/Psych negative neurological ROS  negative psych ROS   GI/Hepatic negative GI ROS, Neg liver ROS,   Endo/Other  Hypothyroidism   Renal/GU negative Renal ROS  negative genitourinary   Musculoskeletal  (+) Arthritis , Osteoarthritis,    Abdominal   Peds  Hematology  (+) Blood dyscrasia, anemia ,   Anesthesia Other Findings   Reproductive/Obstetrics negative OB ROS                            Anesthesia Physical Anesthesia Plan  ASA: III  Anesthesia Plan: General   Post-op Pain Management:    Induction: Intravenous  PONV Risk Score and Plan: 3 and Treatment may vary due to age or medical condition  Airway Management Planned: Mask  Additional Equipment:   Intra-op Plan:   Post-operative Plan:   Informed Consent: I have reviewed the patients History and Physical, chart, labs and discussed the procedure including the risks, benefits and alternatives for the proposed anesthesia with the patient or authorized representative who has indicated his/her understanding and acceptance.     Dental advisory given  Plan Discussed with: CRNA  Anesthesia Plan Comments:         Anesthesia Quick Evaluation

## 2018-11-08 NOTE — Discharge Instructions (Signed)

## 2018-11-08 NOTE — Anesthesia Postprocedure Evaluation (Signed)
Anesthesia Post Note  Patient: Kim [name]  Procedure(s) Performed: CARDIOVERSION (N/A ) TRANSESOPHAGEAL ECHOCARDIOGRAM (TEE) (N/A )     Patient location during evaluation: Endoscopy Anesthesia Type: General Level of consciousness: awake and alert Pain management: pain level controlled Vital Signs Assessment: post-procedure vital signs reviewed and stable Respiratory status: spontaneous breathing, nonlabored ventilation and respiratory function stable Cardiovascular status: blood pressure returned to baseline and stable Postop Assessment: no apparent nausea or vomiting Anesthetic complications: no    Last Vitals:  Vitals:   11/08/18 1400 11/08/18 1410  BP: 105/72 121/83  Pulse: 67 64  Resp: (!) 25 14  Temp:    SpO2: 99% 99%    Last Pain:  Vitals:   11/08/18 1410  TempSrc:   PainSc: (P) 0-No pain                 Audryanna Zurita,W. EDMOND

## 2018-11-08 NOTE — CV Procedure (Signed)
Procedure: TEE  Indication: Atrial fibrillation  Sedation: Per anesthesiology  Findings: Please see echo section for full report.  Normal left ventricular size with moderate-severe asymmetric septal hypertrophy.  There did not appear to be a significant gradient across the aortic valve/LV outflow tract.  Normal right ventricular size and systolic function.  Severe left atrial enlargement, no LA appendage thrombus. Mild right atrial enlargement. Possible tiny PFO by color doppler.  Mild-moderate TR, peak RV-RA gradient 37 mmHg.  Trileaflet aortic valve with no stenosis and trivial regurgitation.  There was prolapse of the posterior mitral leaflet without frank flail.  There was minimal systolic anterior motion of the anterior mitral leaflet.  There was eccentric, anteriorly-directed mitral regurgitation. Unable to do PISA given eccentricity of jet.  Visually, I think that MR was severe. There was flattening/slight flow reversal in the pulmonary vein systolic doppler signal.  Normal caliber thoracic aorta with minimal plaque.   Impression:  1. No LA appendage thrombus, may proceed to DCCV.   2. Suspect severe MR.  I think the mechanism is most likely mitral valve prolapse.  There appeared to be only minimal SAM.   [name] 11/08/2018 1:53 PM

## 2018-11-09 ENCOUNTER — Encounter (HOSPITAL_COMMUNITY): Payer: Self-pay | Admitting: Cardiology

## 2018-11-11 ENCOUNTER — Other Ambulatory Visit (HOSPITAL_COMMUNITY): Payer: Self-pay | Admitting: Cardiology

## 2018-11-13 ENCOUNTER — Ambulatory Visit: Payer: Medicare Other | Admitting: Physical Therapy

## 2018-11-14 ENCOUNTER — Encounter: Payer: Self-pay | Admitting: Physical Therapy

## 2018-11-14 ENCOUNTER — Ambulatory Visit: Payer: Medicare Other | Attending: Thoracic Surgery (Cardiothoracic Vascular Surgery) | Admitting: Physical Therapy

## 2018-11-14 DIAGNOSIS — R2689 Other abnormalities of gait and mobility: Secondary | ICD-10-CM

## 2018-11-14 NOTE — Addendum Note (Signed)
Addended by: Carney Living on: 11/14/2018 03:04 PM   Modules accepted: Orders

## 2018-11-14 NOTE — Therapy (Addendum)
[place], [place], [address] Phone: [phone]   Fax:  [phone]  Physical Therapy Evaluation  Patient Details  Name: Kim [name] MRN: [id number] Date of Birth: 1935-08-21 Referring Provider (PT): Dr Tera Helper    Encounter Date: 11/14/2018  PT End of Session - 11/14/18 0843    Visit Number  1    Number of Visits  1    Date for PT Re-Evaluation  11/14/18    Authorization Type  [employer]     PT Start Time  0800    PT Stop Time  0835    PT Time Calculation (min)  35 min    Activity Tolerance  Patient tolerated treatment well    Behavior During Therapy  [facility] for tasks assessed/performed       Past Medical History:  Diagnosis Date  . Allergic rhinitis   . Allergy   . Arthritis    hands  . Breast cancer of upper-outer quadrant of left female breast ([place]) 11/08/13   finished chemo and radiation 2015  . CAD (coronary artery disease)    a. 12/2013 Cath/PCI: LM nl, LAD 20p, 91m, 30d, D1 30p, LCX 20p, 43m, OM1 20, Mo2 40p, RCA 30p, 72m (4.0x16 Promus Premier DES), 20d, RPL 30, RPDA 70p, 26m, EF 65-70%.  . Diverticulosis   . HOH (hard of hearing)    bilateral, wears hearing aids  . Hx of adenomatous colonic polyps 02/1999  . Hyperlipidemia   . Hypertension   . Hypertrophic obstructive cardiomyopathy (HOCM) [facility])    Cardiologist is Dr. [name]  . Hypothyroidism   . Iron deficiency anemia due to chronic blood loss 01/07/2016  . Low back pain    gets ESI from Dr. Rennis Harding  . Malabsorption of iron 01/07/2016  . Myocardial infarction ([place]) 12/2013   very mild-with first chemo -placed stent x1  . Persistent atrial fibrillation with rapid ventricular response 10/14/2018  . Personal history of radiation therapy 2015  . S/P radiation therapy 05/14/2014-06/26/2014   1) Left Breast  / 50 Gy in 25 fractions, 2) Left Supraclavicular fossa/ 47.5 Gy in 25 fractions, 3) Left Posterior Axillary boost / 4.825 Gy  in 25 fractions, 4) Left Breast boost / 10 Gy in 5 fractions   . Tubulovillous adenoma of colon 12/2009   with HGD  . Wears hearing aid    left and right     Past Surgical History:  Procedure Laterality Date  . ABDOMINAL HYSTERECTOMY    . ANTERIOR AND POSTERIOR REPAIR N/A 01/17/2015   Procedure: CYSTOCELE REPAIR ;  Surgeon: Princess Bruins, MD;  Location: [facility];  Service: Gynecology;  Laterality: N/A;  . BILATERAL SALPINGOOPHORECTOMY     see Laurin Coder NP for GYN exams  . BREAST BIOPSY Left 10/18/2013  . BREAST BIOPSY Left 10/31/2013  . BREAST LUMPECTOMY Left 11/05/2013  . BREAST LUMPECTOMY WITH NEEDLE LOCALIZATION AND AXILLARY LYMPH NODE DISSECTION Left 11/05/2013   Procedure: LEFT BREAST LUMPECTOMY WITH NEEDLE LOCALIZATION and axillary lymph Node Dissection;  Surgeon: Edward Jolly, MD;  Location: [facility];  Service: General;  Laterality: Left;  . BREAST SURGERY  11/2013   left lumpectomy  . CARDIOVERSION N/A 10/17/2018   Procedure: CARDIOVERSION;  Surgeon: Larey Dresser, MD;  Location: [facility] ENDOSCOPY;  Service: Cardiovascular;  Laterality: N/A;  . CARDIOVERSION N/A 11/08/2018   Procedure: CARDIOVERSION;  Surgeon: Larey Dresser, MD;  Location: [facility] ENDOSCOPY;  Service: Cardiovascular;  Laterality: N/A;  .  CATARACT EXTRACTION W/ INTRAOCULAR LENS  IMPLANT, BILATERAL  2012   bilateral  . COLONOSCOPY  11/04/2015   per Dr. Fuller Plan, adenomatous polyps, no repeats planned   . EYE SURGERY  11/22/2006   cataracts, bilateral, intraocular lens implant  . JOINT REPLACEMENT     2019  . LEFT HEART CATHETERIZATION WITH CORONARY ANGIOGRAM N/A 12/19/2013   Procedure: LEFT HEART CATHETERIZATION WITH CORONARY ANGIOGRAM;  Surgeon: Burnell Blanks, MD;  Location: [facility] CATH LAB;  Service: Cardiovascular;  Laterality: N/A;  . lumbar epidural steroid injection     lumber spinal stenosis  . LUMBAR LAMINECTOMY/DECOMPRESSION MICRODISCECTOMY Right 03/01/2018   Procedure: RIGHT LUMBAR  FOUR- LUMBAR FIVE LAMINECTOMY/MICRODISCECTOMY;  Surgeon: [name], MD;  Location: [facility];  Service: Neurosurgery;  Laterality: Right;  . POLYPECTOMY    . PORTACATH PLACEMENT Right 12/11/2013   Procedure: INSERTION PORT-A-CATH;  Surgeon: Edward Jolly, MD;  Location: [facility];  Service: General;  Laterality: Right;  Subclavian Vein;   . RIGHT/LEFT HEART CATH AND CORONARY ANGIOGRAPHY N/A 11/03/2018   Procedure: RIGHT/LEFT HEART CATH AND CORONARY ANGIOGRAPHY;  Surgeon: Larey Dresser, MD;  Location: [facility] CV LAB;  Service: Cardiovascular;  Laterality: N/A;  . TEE WITHOUT CARDIOVERSION N/A 10/17/2018   Procedure: TRANSESOPHAGEAL ECHOCARDIOGRAM (TEE);  Surgeon: Larey Dresser, MD;  Location: [facility] ENDOSCOPY;  Service: Cardiovascular;  Laterality: N/A;  . TEE WITHOUT CARDIOVERSION N/A 11/08/2018   Procedure: TRANSESOPHAGEAL ECHOCARDIOGRAM (TEE);  Surgeon: Larey Dresser, MD;  Location: [facility] ENDOSCOPY;  Service: Cardiovascular;  Laterality: N/A;  . TOTAL KNEE ARTHROPLASTY Right 10/24/2017   Procedure: TOTAL RIGHT KNEE ARTHROPLASTY;  Surgeon: Gaynelle Arabian, MD;  Location: [facility];  Service: Orthopedics;  Laterality: Right;    There were no vitals filed for this visit.   Subjective Assessment - 11/14/18 0803    Subjective  Patient began having shortness of breath at the beggining of the year. She [place] the hops[place]. Since that point she has had difficulty walking distances and doing housework.     Currently in Pain?  --   Numbness in the right shin         [facility] PT Assessment - 11/14/18 0001      Assessment   Medical Diagnosis  Severe Mitral Regurgitation     Referring Provider (PT)  Dr Tera Helper     Onset Date/Surgical Date  10/04/18    Hand Dominance  Right    Next MD Visit  tomorrow     Prior Therapy  For her lower back       Precautions   Precautions  None      Restrictions   Weight Bearing Restrictions  No      Balance Screen   Has the  patient fallen in the past 6 months  No    Has the patient had a decrease in activity level because of a fear of falling?   No    Is the patient reluctant to leave their home because of a fear of falling?   No      Home Environment   Additional Comments  nothing pertinant        Prior Function   Level of Independence  Independent    Vocation  Retired      Associate Professor   Overall Cognitive Status  Within Functional Limits for tasks assessed    Attention  Focused    Focused Attention  Appears intact    Memory  Appears intact  Awareness  Appears intact    Problem Solving  Appears intact      Sensation   Light Touch  Appears Intact    Additional Comments  numbness into left arm and right leg       Coordination   Gross Motor Movements are Fluid and Coordinated  Yes    Fine Motor Movements are Fluid and Coordinated  Yes      Posture/Postural Control   Posture Comments  forward head       ROM / Strength   AROM / PROM / Strength  AROM;Strength;PROM      AROM   Overall AROM Comments  mild mobility limitations with bilateral shoulder IR       Strength   Overall Strength Comments  right hip flexion 4/5 all UE /LE strength 5/5     Strength Assessment Site  Hand    Right/Left hand  Right;Left    Right Hand Grip (lbs)  25    Left Hand Grip (lbs)  20      6 Minute Walk- Baseline   6 Minute Walk- Baseline  --       [facility] Pre-Surgical Assessment - 11/14/18 0001    5 Meter Walk Test- trial 1  9 sec    5 Meter Walk Test- trial 2  9 sec.     5 Meter Walk Test- trial 3  10 sec.    5 meter walk test average  9.33 sec    4 Stage Balance Test tolerated for:   10 sec.    4 Stage Balance Test Position  3    comment  could not stand on 1 foot     Sit To Stand Test- trial 1  0 sec.    Comment  could not stand without using hands to push up     ADL/IADL Independent with:  Bathing;Dressing;Meal prep;Finances    ADL/IADL Needs Assistance with:  Yard work    BP (mmHg)  99/60    HR (bpm)  67     02 Sat (%RA)  96 %    Modified Borg Scale for Dyspnea  0- Nothing at all    Perceived Rate of Exertion (Borg)  6-    6 Minute Walk Post Test  yes    BP (mmHg)  103/64    HR (bpm)  74    02 Sat (%RA)  97 %    Modified Borg Scale for Dyspnea  2- Mild shortness of breath    Perceived Rate of Exertion (Borg)  11- Fairly light    Aerobic Endurance Distance Walked  408    Endurance additional comments  2 seated rest breaks. See POC section for details.               Objective measurements completed on examination: See above findings.                           Plan - 11/14/18 0843    Clinical Impression Statement  See below     Clinical Presentation  Stable    Clinical Decision Making  Low    Rehab Potential  Good    PT Frequency  One time visit      Clinical Impression Statement: Pt is a 84 yo female  presenting to OP PT for evaluation prior to possible Mitra-clip surgery due to severe Mitral Regurgitation.  Pt reports onset of dyspnea approximately 2 months ago. Symptoms are  limiting her ability to walk and perform ADL's. Pt presents with slightly limited ROM  Of the shoulders and strength deficits in the right lower leg, decreased balance and is at moderate fall risk 4 stage balance test, decreased walking speed and decreased aerobic endurance per 6 minute walk test. Pt ambulated 116 feet in 50 sec before requesting a seated rest beak lasting 1:57. At time of rest, patient's HR was 70 bpm and O2 was 95 on room air. She then ambulated 232 in 1:30 at the time of rest her HR was 72 and O2 was measured at 80%. With deep breathing she was able to get her O2 back up to 90%. She then ambulated 60 feet in 37 seconds.  Pt reported 2/10 shortness of breath on modified scale for dyspnea. Pt ambulated a total of 408 feet in 6 minute walk. B/P did not  increased significantly with 6 minute walk test. Based on the Short Physical Performance Battery, patient has a frailty  rating of 6/12 with </= 5/12 considered frail.   Patient will benefit from skilled therapeutic intervention in order to improve the following deficits and impairments:     Visit Diagnosis: Other abnormalities of gait and mobility - Plan: PT plan of care cert/re-cert, CANCELED: PT plan of care cert/re-cert     Problem List Patient Active Problem List   Diagnosis Date Noted  . Nonrheumatic mitral valve regurgitation 10/30/2018  . Persistent atrial fibrillation with rapid ventricular response 10/14/2018  . HNP (herniated nucleus pulposus), lumbar 03/01/2018  . History of total knee replacement, right 11/13/2017  . OA (osteoarthritis) of knee 10/24/2017  . Chronic pain of right knee 06/17/2017  . Osteoporosis 07/30/2016  . Iron deficiency anemia due to chronic blood loss 01/07/2016  . Malabsorption of iron 01/07/2016  . Postoperative state 01/17/2015  . HOCM (hypertrophic obstructive cardiomyopathy) ([place]) 01/09/2014  . Breast cancer ([place]) 01/09/2014  . Coronary atherosclerosis of native coronary artery 12/20/2013  . CAD (coronary artery disease)   . Hyperlipidemia   . Unstable angina ([place]) 12/19/2013  . Breast cancer of upper-outer quadrant of left female breast ([place]) 11/05/2013  . DCIS (ductal carcinoma in situ) of breast 10/26/2013  . Hypertension 04/05/2012  . Cardiac murmur 04/05/2012  . UNSPECIFIED HEARING LOSS 08/07/2010  . LOW BACK PAIN 04/22/2009  . CONSTIPATION 11/04/2008  . COLONIC POLYPS, HX OF 11/01/2008  . ALLERGIC RHINITIS 06/27/2007  . Hypothyroidism 06/22/2007    Carney Living PT DPT  11/14/2018, 3:03 PM  [facility] [address] [place], [place], [address] Phone: [phone]   Fax:  [phone]  Name: Kim [name] MRN: [id number] Date of Birth: 08-08-1935

## 2018-11-15 ENCOUNTER — Ambulatory Visit (HOSPITAL_COMMUNITY)
Admission: RE | Admit: 2018-11-15 | Discharge: 2018-11-15 | Disposition: A | Discharge status: Home / Self Care | Payer: Medicare Other | Source: Ambulatory Visit | Attending: Internal Medicine | Admitting: Internal Medicine

## 2018-11-15 ENCOUNTER — Other Ambulatory Visit: Payer: Self-pay

## 2018-11-15 VITALS — BP 134/72 | HR 63 | Wt 142.2 lb

## 2018-11-15 DIAGNOSIS — I110 Hypertensive heart disease with heart failure: Secondary | ICD-10-CM | POA: Insufficient documentation

## 2018-11-15 DIAGNOSIS — I421 Obstructive hypertrophic cardiomyopathy: Secondary | ICD-10-CM | POA: Insufficient documentation

## 2018-11-15 DIAGNOSIS — Z955 Presence of coronary angioplasty implant and graft: Secondary | ICD-10-CM | POA: Diagnosis not present

## 2018-11-15 DIAGNOSIS — I480 Paroxysmal atrial fibrillation: Secondary | ICD-10-CM | POA: Diagnosis not present

## 2018-11-15 DIAGNOSIS — I5032 Chronic diastolic (congestive) heart failure: Secondary | ICD-10-CM

## 2018-11-15 DIAGNOSIS — E782 Mixed hyperlipidemia: Secondary | ICD-10-CM | POA: Diagnosis not present

## 2018-11-15 DIAGNOSIS — Z7901 Long term (current) use of anticoagulants: Secondary | ICD-10-CM | POA: Insufficient documentation

## 2018-11-15 DIAGNOSIS — Z853 Personal history of malignant neoplasm of breast: Secondary | ICD-10-CM | POA: Insufficient documentation

## 2018-11-15 DIAGNOSIS — Z8249 Family history of ischemic heart disease and other diseases of the circulatory system: Secondary | ICD-10-CM | POA: Insufficient documentation

## 2018-11-15 DIAGNOSIS — E039 Hypothyroidism, unspecified: Secondary | ICD-10-CM | POA: Diagnosis not present

## 2018-11-15 DIAGNOSIS — I10 Essential (primary) hypertension: Secondary | ICD-10-CM

## 2018-11-15 DIAGNOSIS — I2510 Atherosclerotic heart disease of native coronary artery without angina pectoris: Secondary | ICD-10-CM | POA: Diagnosis not present

## 2018-11-15 DIAGNOSIS — E785 Hyperlipidemia, unspecified: Secondary | ICD-10-CM | POA: Diagnosis not present

## 2018-11-15 DIAGNOSIS — I340 Nonrheumatic mitral (valve) insufficiency: Secondary | ICD-10-CM | POA: Insufficient documentation

## 2018-11-15 DIAGNOSIS — Z79899 Other long term (current) drug therapy: Secondary | ICD-10-CM | POA: Diagnosis not present

## 2018-11-15 LAB — CBC
HCT: 39.8 % (ref 36.0–46.0)
Hemoglobin: 12.5 g/dL (ref 12.0–15.0)
MCH: 30 pg (ref 26.0–34.0)
MCHC: 31.4 g/dL (ref 30.0–36.0)
MCV: 95.7 fL (ref 80.0–100.0)
Platelets: 118 10*3/uL — ABNORMAL LOW (ref 150–400)
RBC: 4.16 MIL/uL (ref 3.87–5.11)
RDW: 14.2 % (ref 11.5–15.5)
WBC: 6.1 10*3/uL (ref 4.0–10.5)
nRBC: 0 % (ref 0.0–0.2)

## 2018-11-15 LAB — COMPREHENSIVE METABOLIC PANEL
ALT: 12 U/L (ref 0–44)
AST: 15 U/L (ref 15–41)
Albumin: 3.9 g/dL (ref 3.5–5.0)
Alkaline Phosphatase: 58 U/L (ref 38–126)
Anion gap: 11 (ref 5–15)
BUN: 20 mg/dL (ref 8–23)
CO2: 25 mmol/L (ref 22–32)
Calcium: 9.4 mg/dL (ref 8.9–10.3)
Chloride: 100 mmol/L (ref 98–111)
Creatinine, Ser: 1.32 mg/dL — ABNORMAL HIGH (ref 0.44–1.00)
GFR calc Af Amer: 44 mL/min — ABNORMAL LOW (ref >60)
GFR calc non Af Amer: 38 mL/min — ABNORMAL LOW (ref >60)
Glucose, Bld: 100 mg/dL — ABNORMAL HIGH (ref 70–99)
Potassium: 4.3 mmol/L (ref 3.5–5.1)
Sodium: 136 mmol/L (ref 135–145)
Total Bilirubin: 1.4 mg/dL — ABNORMAL HIGH (ref 0.3–1.2)
Total Protein: 6.6 g/dL (ref 6.5–8.1)

## 2018-11-15 LAB — TSH: TSH: 1.752 u[IU]/mL (ref 0.350–4.500)

## 2018-11-15 NOTE — Progress Notes (Signed)
Patient ID: Kim [name], female   DOB: [birth year], 84 y.o.   MRN: [id number] PCP: Dr. Sarajane Jews Oncologist: Dr. Marin Olp Cardiology: Dr. Aundra Dubin  84 y.o. with history of CAD, HCM, paroxysmal atrial fibrillation, and mitral regurgitation presents for followup of HCM, CHF, MR.  Patient had breast cancer in 2015 and received treatment involving Herceptin.  During breast cancer treatment, she developed unstable angina and ended up getting a DES to the mid RCA in 3/15.  Patient additionally has a history of HOCM.  This has been recognized on prior echoes.  She has severe asymmetric basal septal hypertrophy and SAM with LVOT gradient peak 58 mmHg on echo in 3/15 along with moderate MR.  Repeat echo in 7/15 showed LVOT gradient down to 30 mmHg on higher beta blocker.  Repeat echo in 11/15 showed no significant LVOT gradient but SAM still present.  Echo (8/16) showed asymmetric septal hypertrophy, No SAM, no significant LVOT gradient.  Echo 4/18 showed EF 55-60%, small LVOT gradient with severe asymmetric septal hypertrophy, mild MR, PASP 32 mmHg.    She was admitted in 1/20 with symptomatic atrial fibrillation with RVR, this was a new diagnosis.  She was started on Eliquis and cardioverted after TEE back to NSR.  TEE showed severe mitral regurgitation that appeared to be due mostly to posterior leaflet prolapse, there was minimal systolic anterior motion.   She returns to clinic today for regular follow. Last clinic visit found to be back in Afib, rate-controlled and asymptomatic ,now post-DCCV 11/08/18. ECG today shows NSR. Doing well overall, remains fatigued. Had PT eval yesterday for possible MV repair & MAZE with Dr. Roxy Manns. Baseline fatigue. Denies CP, SOB, lightheadedness, dizziness. Having mild intermittent paraesthesias in her L arm. Cannot recall medications, but states she is taking them as directed.  Labs (3/15): K 4.5, creatinine 0.84, LDL 91, HDL 46 Labs (5/15): K 3.9, creatinine 1.1 Labs (12/15): LDL 80,  HDL 28, hemoglobin 10.9 Labs (1/16): K 3.7, creatinine 0.8 Labs (2/16): K 3.6, creatinine 1.2, HCT 34.4 Labs (6/16): K 4.1, creatinine 1.24 Labs (7/17): K 4.1, creatinine 1.2, HCT 38.8, LDL 143 Labs (1/18): LDL 142 Labs (5/18): K 4.4, creatinine 1.0 Labs (1/20): K 3.9, creatinine 1.27  ECG: NSR 63 bpm, personally reviewed.  PMH: 1. CAD: Unstable angina 3/15 with LHC showing 99% mRCA stenosis, treated with DES to mRCA.  2. Hypothyroidism 3. Diverticulosis 4. HTN 5. H/o TAH/BSO 6. Breast cancer: s/p lumpectomy with lymph node biopsy in 2/15.  4/10 nodes positive.  She was started on docetaxol/carboplatin/Herceptin in 3/15 with plan for 6 cycles chemo.  7. Hypertrophic obstructive cardiomyopathy: Echo (3/15) with severe focal basal septal hypertrophy (22 mm), narrow LV outflow tract with mitral valve SAM and 58 mmHg peak LVOT resting gradient, EF 60-65%, moderate MR, moderate LAE, normal RV, lateral s' 10.4 cm/sec.  Patient says that her 2 grown sons has had echoes to screen for HOCM.  Echo (4/15) with EF 65-70%, severe focal basal septal hypertrophy, LVOT gradient 33 mmHg, SAM was present with moderate MR, normal RV size and systolic function, lateral S' 10.6, GLS -17.5%.  Cardiac MRI (5/15) with EF 65%, moderate asymmetric septal hypertrophy, systolic anterior motion of the mitral valve with moderate MR, there was no delayed enhancement.  Echo (7/15) with EF 65-70%, moderate ASH, peak LVOT gradient 30 mmHg, SAM with moderate MR, moderate to severe LAE.  Echo (11/15) with EF 55-60%, GLS -15%, no significant LVOT gradient, systolic anterior motion of the mitral valve with  mild MR, normal RV size and systolic function. Echo (4/14) with EF 55-60%, severe asymmetric septal hypertrophy, LVOT gradient 20 mmHg, mild-moderate MR, GLS -18%.  - Echo (8/16): EF 60-65%, asymmetric septal hypertrophy, no significant LV outflow tract gradient, mild MR.   - Echo (4/18): EF 55-60%, small LVOT gradient with severe  asymmetric septal hypertrophy, mild MR, PASP 32 mmHg.   - TEE (1/20): EF 55-60%, moderate-severe asymmetric septal hypertrophy, no LVOT gradient, normal RV size and systolic function, severe LAE, severe MR with posterior MV leaflet prolapse and minimal SAM.   8. Hyperlipidemia: Myalgias with atorvastatin, myalgias with Repatha.  9. Atrial fibrillation: Paroxysmal, first noted in 1/20.  10. Mitral regurgitation: Appears severe on 1/20 TEE.  Has posterior leaflet prolapse with minimal SAM.   SH: Married, 2 children, retired, nonsmoker.   FH: No family history of HOCM or sudden death.  There is a family history of CAD.   Review of systems complete and found to be negative unless listed in HPI.    Current Outpatient Medications  Medication Sig Dispense Refill  . amiodarone (PACERONE) 200 MG tablet 400mg  (2 tabs) twice daily for 1 week. 200mg  (1 tab) twice daily for 1 week. Then 200mg  (1 tab) daily. (Patient taking differently: Take 400 mg by mouth 2 (two) times daily. 400mg  (2 tabs) twice daily for 1 week. 200mg  (1 tab) twice daily for 1 week. Then 200mg  (1 tab) daily.) 90 tablet 3  . apixaban (ELIQUIS) 5 MG TABS tablet Take 1 tablet (5 mg total) by mouth 2 (two) times daily. 60 tablet 3  . Calcium Carbonate-Vitamin D (CALCIUM-D PO) Take 1 tablet by mouth daily.    Marland Kitchen diltiazem (CARDIZEM CD) 120 MG 24 hr capsule Take 1 capsule (120 mg total) by mouth daily. 30 capsule 5  . exemestane (AROMASIN) 25 MG tablet Take 1 tablet (25 mg total) by mouth daily after breakfast. 90 tablet 3  . ezetimibe (ZETIA) 10 MG tablet TAKE 1 TABLET BY MOUTH ONCE A DAY 30 tablet 1  . furosemide (LASIX) 40 MG tablet Take 1 tablet (40 mg total) by mouth daily. 30 tablet 5  . gabapentin (NEURONTIN) 100 MG capsule Take 100 mg by mouth daily.     Marland Kitchen levothyroxine (SYNTHROID, LEVOTHROID) 137 MCG tablet Take 1 tablet (137 mcg total) by mouth daily before breakfast. 90 tablet 3  . metoprolol succinate (TOPROL-XL) 50 MG 24 hr tablet  Take 1 tablet (50 mg total) by mouth daily. Take with or immediately following a meal. 30 tablet 5  . Multiple Vitamin (MULTIVITAMIN WITH MINERALS) TABS tablet Take 1 tablet by mouth daily.    . potassium chloride (K-DUR) 10 MEQ tablet Take 2 tablets (20 mEq total) by mouth daily. 60 tablet 5   No current facility-administered medications for this visit.    Vitals:   11/15/18 0903  BP: 134/72  Pulse: 63  SpO2: 98%  Weight: 64.5 kg (142 lb 3.2 oz)    Wt Readings from Last 3 Encounters:  11/15/18 64.5 kg (142 lb 3.2 oz)  11/03/18 63.5 kg (140 lb)  10/30/18 63.5 kg (140 lb)    General: Elderly female. NAD. HOH HEENT: Normal.  Neck: JVP not elevated. No thyromegaly or thyroid nodule.  Lungs: CTA with normal breathing effort. No wheeze.  CV: RRR. Nondisplaced PMI. 2/6 HSM apex. Abdomen: Soft, nontender, no hepatosplenomegaly, no distention.  Skin: Warm and dry. Intact without lesions or rashes.  Extremities: No clubbing or cyanosis. No edema.  Neurologic: Alert and  oriented, but mildly confused.  Psych: Affect pleasant.   Assessment/Plan: 1. CAD: Status post PCI for unstable angina in 3/15 with DES to [facility]. She is now off Plavix.  ASA stopped when Eliquis begun.  - No s/s of ischemia.     - No ASA, taking Eliquis.  No bleeding.  - Unable to tolerate statins or Repatha, she is on Zetia.   2. Hyperlipidemia: Myalgias with Crestor, Lipitor, and pravastatin.  Myalgias with Repatha.  She has tolerated Zetia 10 mg daily. LDL 95 in 1/20, not at goal but not terrible. No change.   3. Hypertrophic obstructive cardiomyopathy: No family history of HOCM or sudden death (interestingly, her husband has HOCM). Cardiac MRI in 5/15 showed no delayed enhancement.  TEE in 4/18 showed severe asymmetric septal hypertrophy but no LVOT gradient.  There was minimal SAM.   - Continue current Toprol XL and diltiazem CD. Stable.  - Per patient, her 2 sons have had screening echoes to look for hypertrophic  cardiomyopathy and did not show signs of HOCM.    4. Atrial fibrillation: Noted initially in 1/20, suspect this was triggered by mitral regurgitation and HCM.  - EKG today shows she remains in NSR.  - Continue amiodarone 200 mg daily. With severe mitral regurgitation, she will likely not hold NSR for an appreciable time without an antiarrhythmic medication. Discuss tikosyn at last visit. She refuses.  - Continue Toprol XL and diltiazem CD for rate control.   5. Chronic diastolic CHF: Suspect this is triggered by atrial fibrillation and mitral regurgitation. - Volume status stable on exam. NYHA III symptoms chronically, confounded by deconditioning. Had PT eval yesterday.  - Continue Lasix 40 mg daily.   6. Mitral regurgitation: Suspect that mitral regurgitation may be the trigger for atrial fibrillation. Suspect the mechanism of MR is most likely mitral valve prolapse. There appeared to be only minimal SAM on TEE. The left atrium is severely enlarged.  - Dr. Roxy Manns following. Possible MV repair with MAZE, possible septal myectomy.  - Had PT eval yesterday.   CMET, TSH today. Follow up in 6 weeks with APP. Can be seen sooner with worsening symptoms.  Darla Lesches, PA-S 11/15/2018  Patient seen and examined with the above-signed Student. I personally reviewed laboratory data, imaging studies and relevant notes. I independently examined the patient and formulated the important aspects of the plan. I have edited the note to reflect any of my changes or salient points. I have personally discussed the plan with the patient and/or family.  Doing well overall. Remains in NSR. Stable on current medications. No bleeding on Eliquis. RTC 6 months. Sooner with symptoms. Continue follow up with Dr. Roxy Manns for MVR consideration +/- MAZE +/- myectomy.   Shirley Friar, PA-C  11/15/18   Greater than 50% of the 25 minute visit was spent in counseling/coordination of care regarding disease state  education, salt/fluid restriction, sliding scale diuretics, and medication compliance.

## 2018-11-15 NOTE — Patient Instructions (Signed)
Labs today We will only contact you if something comes back abnormal or we need to make some changes. Otherwise no news is good news!    Your physician recommends that you schedule a follow-up appointment in: 4- [place]     Do the following things EVERYDAY: 1) Weigh yourself in the morning before breakfast. Write it down and keep it in a log. 2) Take your medicines as prescribed 3) Eat low salt foods-Limit salt (sodium) to 2000 mg per day.  4) Stay as active as you can everyday 5) Limit all fluids for the day to less than 2 liters '

## 2018-11-17 ENCOUNTER — Encounter: Payer: Self-pay | Admitting: Family

## 2018-11-17 ENCOUNTER — Inpatient Hospital Stay (HOSPITAL_BASED_OUTPATIENT_CLINIC_OR_DEPARTMENT_OTHER): Payer: Medicare Other | Admitting: Family

## 2018-11-17 ENCOUNTER — Inpatient Hospital Stay: Payer: Medicare Other

## 2018-11-17 ENCOUNTER — Inpatient Hospital Stay: Payer: Medicare Other | Attending: Hematology & Oncology

## 2018-11-17 ENCOUNTER — Other Ambulatory Visit: Payer: Self-pay

## 2018-11-17 VITALS — BP 136/69 | HR 78 | Temp 99.8°F | Resp 16 | Wt 139.2 lb

## 2018-11-17 DIAGNOSIS — C50412 Malignant neoplasm of upper-outer quadrant of left female breast: Secondary | ICD-10-CM

## 2018-11-17 DIAGNOSIS — K909 Intestinal malabsorption, unspecified: Secondary | ICD-10-CM

## 2018-11-17 DIAGNOSIS — M818 Other osteoporosis without current pathological fracture: Secondary | ICD-10-CM | POA: Diagnosis not present

## 2018-11-17 DIAGNOSIS — C50012 Malignant neoplasm of nipple and areola, left female breast: Secondary | ICD-10-CM | POA: Insufficient documentation

## 2018-11-17 DIAGNOSIS — Z170 Estrogen receptor positive status [ER+]: Secondary | ICD-10-CM | POA: Insufficient documentation

## 2018-11-17 DIAGNOSIS — D500 Iron deficiency anemia secondary to blood loss (chronic): Secondary | ICD-10-CM | POA: Diagnosis not present

## 2018-11-17 DIAGNOSIS — Z7901 Long term (current) use of anticoagulants: Secondary | ICD-10-CM | POA: Insufficient documentation

## 2018-11-17 DIAGNOSIS — Z9889 Other specified postprocedural states: Secondary | ICD-10-CM

## 2018-11-17 DIAGNOSIS — E039 Hypothyroidism, unspecified: Secondary | ICD-10-CM

## 2018-11-17 LAB — CBC WITH DIFFERENTIAL (CANCER CENTER ONLY)
Abs Immature Granulocytes: 0.02 10*3/uL (ref 0.00–0.07)
Basophils Absolute: 0 10*3/uL (ref 0.0–0.1)
Basophils Relative: 1 %
Eosinophils Absolute: 0.3 10*3/uL (ref 0.0–0.5)
Eosinophils Relative: 4 %
HCT: 40.4 % (ref 36.0–46.0)
Hemoglobin: 12.9 g/dL (ref 12.0–15.0)
Immature Granulocytes: 0 %
Lymphocytes Relative: 9 %
Lymphs Abs: 0.6 10*3/uL — ABNORMAL LOW (ref 0.7–4.0)
MCH: 30.2 pg (ref 26.0–34.0)
MCHC: 31.9 g/dL (ref 30.0–36.0)
MCV: 94.6 fL (ref 80.0–100.0)
Monocytes Absolute: 0.6 10*3/uL (ref 0.1–1.0)
Monocytes Relative: 8 %
Neutro Abs: 5.6 10*3/uL (ref 1.7–7.7)
Neutrophils Relative %: 78 %
Platelet Count: 148 10*3/uL — ABNORMAL LOW (ref 150–400)
RBC: 4.27 MIL/uL (ref 3.87–5.11)
RDW: 14.1 % (ref 11.5–15.5)
WBC Count: 7.1 10*3/uL (ref 4.0–10.5)
nRBC: 0 % (ref 0.0–0.2)

## 2018-11-17 LAB — CMP (CANCER CENTER ONLY)
ALT: 8 U/L (ref 0–44)
AST: 10 U/L — ABNORMAL LOW (ref 15–41)
Albumin: 4.6 g/dL (ref 3.5–5.0)
Alkaline Phosphatase: 73 U/L (ref 38–126)
Anion gap: 9 (ref 5–15)
BUN: 31 mg/dL — ABNORMAL HIGH (ref 8–23)
CO2: 29 mmol/L (ref 22–32)
Calcium: 10.2 mg/dL (ref 8.9–10.3)
Chloride: 99 mmol/L (ref 98–111)
Creatinine: 1.82 mg/dL — ABNORMAL HIGH (ref 0.44–1.00)
GFR, Est AFR Am: 30 mL/min — ABNORMAL LOW (ref >60)
GFR, Estimated: 26 mL/min — ABNORMAL LOW (ref >60)
Glucose, Bld: 172 mg/dL — ABNORMAL HIGH (ref 70–99)
Potassium: 4 mmol/L (ref 3.5–5.1)
Sodium: 137 mmol/L (ref 135–145)
Total Bilirubin: 0.9 mg/dL (ref 0.3–1.2)
Total Protein: 7.1 g/dL (ref 6.5–8.1)

## 2018-11-17 MED ORDER — DENOSUMAB 60 MG/ML ~~LOC~~ SOSY
60.0000 mg | PREFILLED_SYRINGE | SUBCUTANEOUS | Status: DC
  Administered 2018-11-17: 60 mg via SUBCUTANEOUS

## 2018-11-17 MED ORDER — DENOSUMAB 60 MG/ML ~~LOC~~ SOSY
PREFILLED_SYRINGE | SUBCUTANEOUS | Status: AC
  Filled 2018-11-17: qty 1

## 2018-11-17 NOTE — Progress Notes (Signed)
Hematology and Oncology Follow Up Visit  Kim [name] [id number] 04-02-1935 84 y.o. 11/17/2018   Principle Diagnosis:  Stage IIA (T1N1M0) adenocarcinoma of the left breast-triple positive Iron deficiency anemia secondary to blood loss  Past Therapy: Status post cycle 4 of Abraxane/Herceptin Maintenance Herceptin q 3wk - finished 03/25/2015  Current Therapy:   Prolia 60gm sq every 6 months - due in Jan 2020 Aromasin 25 mg by mouth daily - started on 01/10/2017  IV iron as indicated   Interim History:  Kim [name] is here today with her husband for follow-up. She has had issues with atrial fib and mitral valve regurgitation. She also had to be cardioverted on 11/08/18 and continues to follow closely with cardiology.   She is taking her Aromasin daily as prescribed.  Chest exam today was negative. No changes.  She has occasional episodes of dizziness she states is due to the medication she takes for her heart.  She is doing ok on Eliquis and denies any episodes of bleeding.  No fever, chills, n/v, cough, rash, SOB, chest pain, abdominal pain or changes in bowel or bladder habits.  No swelling in her extremities at this time. She has intermittent tingling in the left arm and hand.  She states that she is still having issues with back pain.  No falls or syncopal episodes to report.  No lymphadenopathy noted on exam.  She has a good appetite and is staying well hydrated. She has lost 7 lbs since we saw her back in September.   ECOG Performance Status: 1 - Symptomatic but completely ambulatory  Medications:  Allergies as of 11/17/2018      Reactions   Levaquin [levofloxacin] Hives   Penicillins Swelling   FACIAL SWELLING PATIENT HAS HAD A PCN REACTION WITH IMMEDIATE RASH, FACIAL/TONGUE/THROAT SWELLING, SOB, OR LIGHTHEADEDNESS WITH HYPOTENSION:  #  #  YES  #  #  Has patient had a PCN reaction causing severe rash involving mucus membranes or skin necrosis: No Has patient had a PCN  reaction that required hospitalization: No Has patient had a PCN reaction occurring within the last 10 years: No   Propoxyphene N-acetaminophen Swelling   tongue swelling   Statins Other (See Comments)   Joint pain   Cortisone Rash   Evolocumab Other (See Comments)   Myalgias, fatigue, some itching and burning on her feet,  Arthralgia (Joint Pain)   Myrbetriq [mirabegron] Rash   Rash on face   Pseudoephedrine Other (See Comments)   Facial flushing      Medication List       Accurate as of November 17, 2018 11:15 AM. Always use your most recent med list.        amiodarone 200 MG tablet Commonly known as:  PACERONE 400mg  (2 tabs) twice daily for 1 week. 200mg  (1 tab) twice daily for 1 week. Then 200mg  (1 tab) daily.   apixaban 5 MG Tabs tablet Commonly known as:  ELIQUIS Take 1 tablet (5 mg total) by mouth 2 (two) times daily.   CALCIUM-D PO Take 1 tablet by mouth daily.   diltiazem 120 MG 24 hr capsule Commonly known as:  CARDIZEM CD Take 1 capsule (120 mg total) by mouth daily.   exemestane 25 MG tablet Commonly known as:  AROMASIN Take 1 tablet (25 mg total) by mouth daily after breakfast.   ezetimibe 10 MG tablet Commonly known as:  ZETIA TAKE 1 TABLET BY MOUTH ONCE A DAY   furosemide 40 MG tablet Commonly known  as:  LASIX Take 1 tablet (40 mg total) by mouth daily.   gabapentin 100 MG capsule Commonly known as:  NEURONTIN Take 100 mg by mouth daily.   levothyroxine 137 MCG tablet Commonly known as:  SYNTHROID, LEVOTHROID Take 1 tablet (137 mcg total) by mouth daily before breakfast.   metoprolol succinate 50 MG 24 hr tablet Commonly known as:  TOPROL-XL Take 1 tablet (50 mg total) by mouth daily. Take with or immediately following a meal.   multivitamin with minerals Tabs tablet Take 1 tablet by mouth daily.   potassium chloride 10 MEQ tablet Commonly known as:  K-DUR Take 2 tablets (20 mEq total) by mouth daily.       Allergies:  Allergies    Allergen Reactions  . Levaquin [Levofloxacin] Hives  . Penicillins Swelling    FACIAL SWELLING PATIENT HAS HAD A PCN REACTION WITH IMMEDIATE RASH, FACIAL/TONGUE/THROAT SWELLING, SOB, OR LIGHTHEADEDNESS WITH HYPOTENSION:  #  #  YES  #  #  Has patient had a PCN reaction causing severe rash involving mucus membranes or skin necrosis: No Has patient had a PCN reaction that required hospitalization: No Has patient had a PCN reaction occurring within the last 10 years: No  . Propoxyphene N-Acetaminophen Swelling    tongue swelling  . Statins Other (See Comments)    Joint pain  . Cortisone Rash  . Evolocumab Other (See Comments)    Myalgias, fatigue, some itching and burning on her feet,  Arthralgia (Joint Pain)  . Myrbetriq [Mirabegron] Rash    Rash on face  . Pseudoephedrine Other (See Comments)    Facial flushing    Past Medical History, Surgical history, Social history, and Family History were reviewed and updated.  Review of Systems: All other 10 point review of systems is negative.   Physical Exam:  vitals were not taken for this visit.   Wt Readings from Last 3 Encounters:  11/15/18 142 lb 3.2 oz (64.5 kg)  11/03/18 140 lb (63.5 kg)  10/30/18 140 lb (63.5 kg)    Ocular: Sclerae unicteric, pupils equal, round and reactive to light Ear-nose-throat: Oropharynx clear, dentition fair Lymphatic: No cervical, supraclavicular or axillary adenopathy Lungs no rales or rhonchi, good excursion bilaterally Heart regular rate and rhythm, no murmur appreciated Abd soft, nontender, positive bowel sounds, no liver or spleen tip palpated on exam, no fluid wave  MSK no focal spinal tenderness, no joint edema Neuro: non-focal, well-oriented, appropriate affect Breasts: No changes.   Lab Results  Component Value Date   WBC 6.1 11/15/2018   HGB 12.5 11/15/2018   HCT 39.8 11/15/2018   MCV 95.7 11/15/2018   PLT 118 (L) 11/15/2018   Lab Results  Component Value Date   FERRITIN 63  02/23/2017   IRON 93 02/23/2017   TIBC 311 02/23/2017   UIBC 219 02/23/2017   IRONPCTSAT 30 02/23/2017   Lab Results  Component Value Date   RBC 4.16 11/15/2018   No results found for: KPAFRELGTCHN, LAMBDASER, KAPLAMBRATIO No results found for: IGGSERUM, IGA, IGMSERUM No results found for: Odetta Pink, SPEI   Chemistry      Component Value Date/Time   NA 136 11/15/2018 0924   NA 140 06/09/2017 0758   NA 141 10/11/2016 0844   K 4.3 11/15/2018 0924   K 4.5 06/09/2017 0758   K 4.1 10/11/2016 0844   CL 100 11/15/2018 0924   CL 106 06/09/2017 0758   CO2 25 11/15/2018 9373  CO2 28 06/09/2017 0758   CO2 25 10/11/2016 0844   BUN 20 11/15/2018 0924   BUN 17 06/09/2017 0758   BUN 20.3 10/11/2016 0844   CREATININE 1.32 (H) 11/15/2018 0924   CREATININE 0.80 06/27/2018 1045   CREATININE 1.2 06/09/2017 0758   CREATININE 1.1 10/11/2016 0844      Component Value Date/Time   CALCIUM 9.4 11/15/2018 0924   CALCIUM 9.1 06/09/2017 0758   CALCIUM 9.5 10/11/2016 0844   ALKPHOS 58 11/15/2018 0924   ALKPHOS 49 06/09/2017 0758   ALKPHOS 52 10/11/2016 0844   AST 15 11/15/2018 0924   AST 22 06/27/2018 1045   AST 13 10/11/2016 0844   ALT 12 11/15/2018 0924   ALT 17 06/27/2018 1045   ALT 19 06/09/2017 0758   ALT 10 10/11/2016 0844   BILITOT 1.4 (H) 11/15/2018 0924   BILITOT 1.1 06/27/2018 1045   BILITOT 0.83 10/11/2016 0844       Impression and Plan: Ms. Bircher is a very pleasant 84 yo caucasian female with history of stage IIA ductal carcinoma of the left breast, triple positive, 4 positive lymph nodes. She completed her adjuvant chemotherapy in July of 2015 and went on to complete adjuvant Herceptin in June 2016.  She is doing well on Aromasin and verbalized that she is taking as prescribed.  She now has some serious issues going on with heart and states that she may require surgery for the mitral valve regurgitation. She continues  to be followed closely by cardiology.  She received Prolia today as planned.  We will see her in another 6 months for MD follow-up.  She will contact our office with any questions or concerns. We can certainly see her sooner if need be.   Laverna Peace, NP 2/14/202011:15 AM

## 2018-11-17 NOTE — Patient Instructions (Signed)

## 2018-11-20 ENCOUNTER — Encounter: Payer: Self-pay | Admitting: Thoracic Surgery (Cardiothoracic Vascular Surgery)

## 2018-11-20 ENCOUNTER — Other Ambulatory Visit (INDEPENDENT_AMBULATORY_CARE_PROVIDER_SITE_OTHER): Payer: Medicare Other

## 2018-11-20 ENCOUNTER — Ambulatory Visit: Payer: Medicare Other | Admitting: Thoracic Surgery (Cardiothoracic Vascular Surgery)

## 2018-11-20 VITALS — BP 132/72 | HR 67 | Resp 20 | Ht 68.0 in | Wt 139.0 lb

## 2018-11-20 DIAGNOSIS — I421 Obstructive hypertrophic cardiomyopathy: Secondary | ICD-10-CM

## 2018-11-20 DIAGNOSIS — E039 Hypothyroidism, unspecified: Secondary | ICD-10-CM

## 2018-11-20 DIAGNOSIS — I2510 Atherosclerotic heart disease of native coronary artery without angina pectoris: Secondary | ICD-10-CM | POA: Diagnosis not present

## 2018-11-20 DIAGNOSIS — I4819 Other persistent atrial fibrillation: Secondary | ICD-10-CM

## 2018-11-20 DIAGNOSIS — I480 Paroxysmal atrial fibrillation: Secondary | ICD-10-CM | POA: Diagnosis not present

## 2018-11-20 DIAGNOSIS — I340 Nonrheumatic mitral (valve) insufficiency: Secondary | ICD-10-CM

## 2018-11-20 NOTE — Progress Notes (Signed)
[address]       [place] [address]             [phone]     CARDIOTHORACIC SURGERY OFFICE NOTE  Referring Provider is Larey Dresser, MD PCP is Laurey Morale, MD   HPI:  Patient is an 84 year old female with history of coronary artery disease status post PCI and stenting of the right coronary artery in 2015, hypertrophic obstructive cardiomyopathy, hypertension with hypertensive heart disease, mitral valve prolapse with primary mitral regurgitation, and recurrent persistent atrial fibrillation currently maintaining sinus rhythm following cardioversion who returns to the office today to discuss treatment options for management of primary mitral regurgitation and atrial fibrillation.  She was originally seen in consultation on October 30, 2018.  Since then she underwent left and right heart catheterization by Dr. Aundra Dubin which revealed multivessel coronary artery disease with continued patency of the stent in the mid right coronary artery.  She was noted to have diffuse 70 to 80% stenosis of the left anterior descending coronary artery just after takeoff of the large first diagonal branch.  She also had 60% ostial stenosis of the posterior descending coronary artery.  Right heart pressures were normal and cardiac output preserved.  She returns to the office today with her family present.  She states that she feels well and she thinks that she has continued to maintain sinus rhythm.  She specifically denies any symptoms of resting shortness of breath, PND, orthopnea, or lower extremity edema.  She has not had palpitations.  She denies any history of chest pain or chest tightness either with activity or at rest.   Current Outpatient Medications  Medication Sig Dispense Refill  . amiodarone (PACERONE) 200 MG tablet 400mg  (2 tabs) twice daily for 1 week. 200mg  (1 tab) twice daily for 1 week. Then 200mg  (1 tab) daily. (Patient taking differently: Take 400 mg by mouth 2  (two) times daily. 400mg  (2 tabs) twice daily for 1 week. 200mg  (1 tab) twice daily for 1 week. Then 200mg  (1 tab) daily.) 90 tablet 3  . apixaban (ELIQUIS) 5 MG TABS tablet Take 1 tablet (5 mg total) by mouth 2 (two) times daily. 60 tablet 3  . Calcium Carbonate-Vitamin D (CALCIUM-D PO) Take 1 tablet by mouth daily.    Marland Kitchen diltiazem (CARDIZEM CD) 120 MG 24 hr capsule Take 1 capsule (120 mg total) by mouth daily. 30 capsule 5  . exemestane (AROMASIN) 25 MG tablet Take 1 tablet (25 mg total) by mouth daily after breakfast. 90 tablet 3  . ezetimibe (ZETIA) 10 MG tablet TAKE 1 TABLET BY MOUTH ONCE A DAY 30 tablet 1  . furosemide (LASIX) 40 MG tablet Take 1 tablet (40 mg total) by mouth daily. 30 tablet 5  . gabapentin (NEURONTIN) 100 MG capsule Take 100 mg by mouth daily.     Marland Kitchen levothyroxine (SYNTHROID, LEVOTHROID) 137 MCG tablet Take 1 tablet (137 mcg total) by mouth daily before breakfast. 90 tablet 3  . metoprolol succinate (TOPROL-XL) 50 MG 24 hr tablet Take 1 tablet (50 mg total) by mouth daily. Take with or immediately following a meal. 30 tablet 5  . Multiple Vitamin (MULTIVITAMIN WITH MINERALS) TABS tablet Take 1 tablet by mouth daily.    . potassium chloride (K-DUR) 10 MEQ tablet Take 2 tablets (20 mEq total) by mouth daily. 60 tablet 5   No current facility-administered medications for this visit.       Physical Exam:  BP 132/72   Pulse 67   Resp 20   Ht 5\' 8"  (1.727 m)   Wt 139 lb (63 kg)   LMP 10/19/1991   SpO2 96% Comment: RA  BMI 21.13 kg/m   General:  Elderly and somewhat frail  Chest:   Clear to auscultation  CV:   Regular rate and rhythm with grade 3/6 systolic murmur heard best left lower sternal border  Incisions:  n/a  Abdomen:  Soft nontender  Extremities:  Warm and well-perfused  Diagnostic Tests:  RIGHT/LEFT HEART CATH AND CORONARY ANGIOGRAPHY  Conclusion   1. Well-optimized right/left heart filling pressures, normal pulmonary artery pressure, preserved  cardiac output.  2. 60% ostial PDA stenosis, relatively small vessel.  3. 70-80% proximal LAD stenosis after large D1 take-off.   Surgeon Notes     11/08/2018 1:53 PM CV Procedure signed by Larey Dresser, MD    10/17/2018 1:15 PM CV Procedure signed by Larey Dresser, MD  Procedural Details   Technical Details Procedure: Right Heart Cath, Left Heart Cath, Selective Coronary Angiography  Indication: Mitral regurgitation, pre-surgery.   Procedural Details: The right groin and right radial area were prepped, draped, and anesthetized with 1% lidocaine. Using the modified Seldinger technique a 5 French sheath was placed in the right radial artery and a 7 French sheath was placed in the right femoral vein. The patient received 3 mg IA verapamil and weight-based IV heparin. A Swan-Ganz catheter was used for the right heart catheterization. Standard protocol was followed for recording of right heart pressures and sampling of oxygen saturations. Fick cardiac output was calculated. Standard Judkins catheters were used for selective coronary angiography. There were no immediate procedural complications. The patient was transferred to the post catheterization recovery area for further monitoring.   Estimated blood loss <50 mL.   During this procedure medications were administered to achieve and maintain moderate conscious sedation while the patient's heart rate, blood pressure, and oxygen saturation were continuously monitored and I was present face-to-face 100% of this time.  Medications  (Filter: Administrations occurring from 11/03/18 0715 to 11/03/18 6387)  Medication Rate/Dose/Volume Action  Date Time   Heparin (Porcine) in NaCl 1000-0.9 UT/500ML-% SOLN (mL) 500 mL Given 11/03/18 0737   Total dose as of 11/20/18 1301        500 mL        Heparin (Porcine) in NaCl 1000-0.9 UT/500ML-% SOLN (mL) 500 mL Given 11/03/18 0737   Total dose as of 11/20/18 1301        500 mL        midazolam (VERSED)  injection (mg) 1 mg Given 11/03/18 0749   Total dose as of 11/20/18 1301 1 mg Given 0815   2 mg        fentaNYL (SUBLIMAZE) injection (mcg) 25 mcg Given 11/03/18 0749   Total dose as of 11/20/18 1301 25 mcg Given 0814   50 mcg        lidocaine (PF) (XYLOCAINE) 1 % injection (mL) 2 mL Given 11/03/18 0755   Total dose as of 11/20/18 1301 15 mL Given 0812   19 mL 2 mL Given 0821   Radial Cocktail/Verapamil only (mL)  Given 11/03/18 0821   Total dose as of 11/20/18 1301        Cannot be calculated        heparin injection (Units) 3,000 Units Given 11/03/18 0826   Total dose as of 11/20/18 1301        3,000  Units        iohexol (OMNIPAQUE) 350 MG/ML injection (mL) 45 mL Given 11/03/18 0841   Total dose as of 11/20/18 1301        45 mL        Sedation Time   Sedation Time Physician-1: 50 minutes 14 seconds  Coronary Findings   Diagnostic  Dominance: Right  Left Main  No significant disease.  Left Anterior Descending  70-80% stenosis in the proximal LAD after take-off of a large branching 1st diagonal.  Left Circumflex  50% stenosis mid LCx after take-off of a large PLOM.  Right Coronary Artery  30% proximal RCA stenosis. Patent proximal RCA stent. 60% ostial PDA stenosis.  Intervention   No interventions have been documented.  Right Heart   Right Heart Pressures RHC Procedural Findings: Hemodynamics RA mean 2 RV 24/1 PA 26/9, mean 18 PCWP mean 9, prominent v waves were not noted.  LV 106/12 AO 107/65 No LV-AO gradient  Oxygen saturations: PA 71% AO 100%  Cardiac Output (Fick) 4.5  Cardiac Index (Fick) 2.55  Implants    No implant documentation for this case.  Syngo Images   Show images for CARDIAC CATHETERIZATION  MERGE Images   Show images for CARDIAC CATHETERIZATION   Link to Procedure Log   Procedure Log    Hemo Data    Most Recent Value  Fick Cardiac Output 4.5 L/min  Fick Cardiac Output Index 2.55 (L/min)/BSA  RA A Wave 1 mmHg  RA V Wave 2  mmHg  RA Mean 1 mmHg  RV Systolic Pressure 24 mmHg  RV Diastolic Pressure 0 mmHg  RV EDP 1 mmHg  PA Systolic Pressure 26 mmHg  PA Diastolic Pressure 9 mmHg  PA Mean 18 mmHg  PW A Wave 10 mmHg  PW V Wave 9 mmHg  PW Mean 9 mmHg  AO Systolic Pressure 97 mmHg  AO Diastolic Pressure 60 mmHg  AO Mean 77 mmHg  LV Systolic Pressure 034 mmHg  LV Diastolic Pressure 10 mmHg  LV EDP 12 mmHg  AOp Systolic Pressure 742 mmHg  AOp Diastolic Pressure 65 mmHg  AOp Mean Pressure 84 mmHg  LVp Systolic Pressure 595 mmHg  LVp Diastolic Pressure 10 mmHg  LVp EDP Pressure 15 mmHg  QP/QS 1  TPVR Index 7.05 HRUI  TSVR Index 30.15 HRUI  PVR SVR Ratio 0.12  TPVR/TSVR Ratio 0.23     STS Risk Calculator  Procedure: MV Repair + CAB CALCULATE   Risk of Mortality:  6.281% Renal Failure:  5.656% Permanent Stroke:  2.181% Prolonged Ventilation:  15.660% DSW Infection:  0.089% Reoperation:  6.105% Morbidity or Mortality:  25.206% Short Length of Stay:  7.828% Long Length of Stay:  14.717%   Procedure: MVR + CAB CALCULATE   Risk of Mortality:  9.892% Renal Failure:  7.395% Permanent Stroke:  3.117% Prolonged Ventilation:  21.771% DSW Infection:  0.121% Reoperation:  7.247% Morbidity or Mortality:  31.171% Short Length of Stay:  6.156% Long Length of Stay:  19.640%    Impression:  Patient has mitral valve prolapse with stage D severe symptomatic primary mitral regurgitation.  She recently presented with presumably new onset persistent atrial fibrillation with rapid ventricular response rate and associated with acute exacerbation of chronic diastolic congestive heart failure.  She has since then been maintaining sinus rhythm following DC cardioversion on oral amiodarone.  At present she has mild symptoms of exertional shortness of breath only with more strenuous activity.  She has never had any  symptoms of chest pain or chest tightness since she did not rule in for an  acute myocardial infarction at the time of her presentation in atrial fibrillation.  I have personally reviewed the patient's recent transthoracic and transesophageal echocardiograms as well as her diagnostic cardiac catheterization.  Echocardiograms document the presence of moderate to severe left ventricular hypertrophy with significant diastolic dysfunction.  Transesophageal echocardiogram confirmed the presence of severe mitral regurgitation.  There is severe prolapse involving the middle scallop of the posterior leaflet.  There does not appear to be significant systolic anterior motion of the mitral valve nor dynamic left ventricular outflow tract obstruction on recent TEE.  Previous transthoracic echocardiograms clearly documented the presence of both.  Diagnostic cardiac catheterization reveals multivessel coronary artery disease with diffuse 70 to 80% stenosis of the left anterior descending coronary artery just after takeoff of the diagonal branch.  There is continued patency of the stent in the right coronary artery but there is 60 to 70% ostial stenosis of the posterior descending coronary artery.  I agree the patient would likely benefit from mitral valve repair and Maze procedure.  It is unclear whether or not concomitant septal myomectomy would be beneficial.  I do feel that coronary artery bypass grafting would need to be performed at the time of surgery.  However, I am concerned that this elderly patient might struggle even with uncomplicated surgical intervention.  Under the circumstances, edge-to-edge MitraClip repair might prove to be an excellent alternative for management of the patient's mitral regurgitation and hypertrophic obstructive cardiomyopathy with dynamic left ventricular outflow tract obstruction.      Plan:  The patient and her family were counseled at length regarding the indications, risk, potential benefits of mitral valve repair, Maze procedure, and coronary artery  bypass grafting with or without septal myomectomy.   Alternative approaches such as conventional surgery, edge-to-edge MitraClip repair, and continued medical therapy without intervention were compared and contrasted at length.  The risks associated with conventional surgery were discussed in detail, as were expectations for post-operative convalescence, and why I would be reluctant to consider this patient a candidate for conventional surgery.  All of their questions have been addressed.  The patient will be referred to Dr. Burt Knack for possible MitraClip repair.   I spent in excess of 30 minutes during the conduct of this office consultation and >50% of this time involved direct face-to-face encounter with the patient for counseling and/or coordination of their care.   Valentina Gu. Roxy Manns, MD 11/20/2018 10:49 AM

## 2018-11-20 NOTE — Patient Instructions (Signed)
Continue all previous medications without any changes at this time  

## 2018-11-21 LAB — TSH: TSH: 1.24 u[IU]/mL (ref 0.35–4.50)

## 2018-11-22 ENCOUNTER — Other Ambulatory Visit: Payer: Self-pay | Admitting: Family Medicine

## 2018-11-22 DIAGNOSIS — Z853 Personal history of malignant neoplasm of breast: Secondary | ICD-10-CM

## 2018-11-27 ENCOUNTER — Ambulatory Visit: Payer: Medicare Other | Admitting: Thoracic Surgery (Cardiothoracic Vascular Surgery)

## 2018-11-28 ENCOUNTER — Other Ambulatory Visit: Payer: Self-pay | Admitting: Cardiology

## 2018-11-29 ENCOUNTER — Encounter: Payer: Self-pay | Admitting: Cardiovascular Disease

## 2018-11-29 ENCOUNTER — Ambulatory Visit: Payer: Medicare Other | Admitting: Cardiovascular Disease

## 2018-11-29 VITALS — BP 108/72 | HR 76 | Ht 68.0 in | Wt 138.8 lb

## 2018-11-29 DIAGNOSIS — I480 Paroxysmal atrial fibrillation: Secondary | ICD-10-CM

## 2018-11-29 DIAGNOSIS — I2510 Atherosclerotic heart disease of native coronary artery without angina pectoris: Secondary | ICD-10-CM

## 2018-11-29 DIAGNOSIS — I340 Nonrheumatic mitral (valve) insufficiency: Secondary | ICD-10-CM | POA: Diagnosis not present

## 2018-11-29 NOTE — Patient Instructions (Addendum)
You are scheduled for Pre Admission Testing on Tuesday, December 05, 2018 at 9:00 AM. Please arrive in Admitting at [facility] (Main Entrance A, Valet Parking) at 8:45 AM for check-in. No restrictions for this appointment.    MitraClip instructions: Please arrive in Admitting at [facility] on Wednesday, December 06, 2018 at 10:00 AM for check-in.    Have nothing to eat or drink after midnight the night before surgery.    Continue taking all current medications without change through the day before surgery with the following exceptions:  1) Stop Eliquis on 12/04/2018, you will take your last dose of Eliquis on 12/03/2018. You will be advised after the procedure when to restart this medication.   2) On the morning of surgery do not take Furosemide and Potassium Chloride. You can take all other morning medications with a few sips of water.

## 2018-11-29 NOTE — H&P (View-Only) (Signed)
Cardiology Office Note:    Date:  11/29/2018   ID:  Kim [name], DOB Jan 02, 1935, MRN [id number]  PCP:  Laurey Morale, MD  Cardiologist:  No primary care provider on file.  Electrophysiologist:  None   Referring MD: Laurey Morale, MD   Chief Complaint  Patient presents with  . Shortness of Breath    History of Present Illness:    Kim [name] is a 84 y.o. female with a hx of coronary artery disease, hypertrophic cardiomyopathy, paroxysmal atrial fibrillation, presenting for evaluation of treatment options related to severe mitral regurgitation.  The patient developed symptoms of unstable angina in 2015 and was treated with stenting of the right coronary artery.  She has been followed for hypertrophic cardiomyopathy with SAM and LV outflow tract obstruction.  Her gradients improved with beta-blockade.  The patient presented in January 2020 with atrial fibrillation with RVR.  She underwent TEE directed cardioversion and was started on apixaban for anticoagulation.  She was found to have posterior leaflet prolapse of the mitral valve with severe associated mitral regurgitation at that time.  She was converted to normal sinus rhythm but reverted to atrial fibrillation.  She then was loaded with amiodarone and underwent repeat cardioversion.  The patient is here with her husband and her daughter today.  She continues to have symptoms of shortness of breath with exertion, describing New York Heart Association functional class III symptoms at present.  Also complains of generalized fatigue.  She denies orthopnea, PND, or leg swelling.  She has had no chest pain or chest pressure.  She denies lightheadedness or frank syncope.  The patient lives independently in [place] with her husband. She is a retired Pharmacist, hospital.  She otherwise has been functionally independent.  She had a back surgery last year and has struggled with her mobility since that time.  She complains of gait unsteadiness.  She  has not had any recent falls.  She is tolerating oral anticoagulation without bleeding problems.  Past Medical History:  Diagnosis Date  . Allergic rhinitis   . Allergy   . Arthritis    hands  . Breast cancer of upper-outer quadrant of left female breast ([place]) 11/08/13   finished chemo and radiation 2015  . CAD (coronary artery disease)    a. 12/2013 Cath/PCI: LM nl, LAD 20p, 41m, 30d, D1 30p, LCX 20p, 29m, OM1 20, Mo2 40p, RCA 30p, 37m (4.0x16 Promus Premier DES), 20d, RPL 30, RPDA 70p, 75m, EF 65-70%.  . Diverticulosis   . HOH (hard of hearing)    bilateral, wears hearing aids  . Hx of adenomatous colonic polyps 02/1999  . Hyperlipidemia   . Hypertension   . Hypertrophic obstructive cardiomyopathy (HOCM) [facility])    Cardiologist is Dr. [name]  . Hypothyroidism   . Iron deficiency anemia due to chronic blood loss 01/07/2016  . Low back pain    gets ESI from Dr. Rennis Harding  . Malabsorption of iron 01/07/2016  . Myocardial infarction ([place]) 12/2013   very mild-with first chemo -placed stent x1  . Persistent atrial fibrillation with rapid ventricular response 10/14/2018  . Personal history of radiation therapy 2015  . S/P radiation therapy 05/14/2014-06/26/2014   1) Left Breast  / 50 Gy in 25 fractions, 2) Left Supraclavicular fossa/ 47.5 Gy in 25 fractions, 3) Left Posterior Axillary boost / 4.825 Gy in 25 fractions, 4) Left Breast boost / 10 Gy in 5 fractions   . Tubulovillous adenoma of colon 12/2009  with HGD  . Wears hearing aid    left and right     Past Surgical History:  Procedure Laterality Date  . ABDOMINAL HYSTERECTOMY    . ANTERIOR AND POSTERIOR REPAIR N/A 01/17/2015   Procedure: CYSTOCELE REPAIR ;  Surgeon: Princess Bruins, MD;  Location: [facility];  Service: Gynecology;  Laterality: N/A;  . BILATERAL SALPINGOOPHORECTOMY     see Laurin Coder NP for GYN exams  . BREAST BIOPSY Left 10/18/2013  . BREAST BIOPSY Left 10/31/2013  . BREAST LUMPECTOMY Left 11/05/2013  . BREAST  LUMPECTOMY WITH NEEDLE LOCALIZATION AND AXILLARY LYMPH NODE DISSECTION Left 11/05/2013   Procedure: LEFT BREAST LUMPECTOMY WITH NEEDLE LOCALIZATION and axillary lymph Node Dissection;  Surgeon: Edward Jolly, MD;  Location: [place];  Service: General;  Laterality: Left;  . BREAST SURGERY  11/2013   left lumpectomy  . CARDIOVERSION N/A 10/17/2018   Procedure: CARDIOVERSION;  Surgeon: Larey Dresser, MD;  Location: [facility] ENDOSCOPY;  Service: Cardiovascular;  Laterality: N/A;  . CARDIOVERSION N/A 11/08/2018   Procedure: CARDIOVERSION;  Surgeon: Larey Dresser, MD;  Location: [place];  Service: Cardiovascular;  Laterality: N/A;  . CATARACT EXTRACTION W/ INTRAOCULAR LENS  IMPLANT, BILATERAL  2012   bilateral  . COLONOSCOPY  11/04/2015   per Dr. Fuller Plan, adenomatous polyps, no repeats planned   . EYE SURGERY  11/22/2006   cataracts, bilateral, intraocular lens implant  . JOINT REPLACEMENT     2019  . LEFT HEART CATHETERIZATION WITH CORONARY ANGIOGRAM N/A 12/19/2013   Procedure: LEFT HEART CATHETERIZATION WITH CORONARY ANGIOGRAM;  Surgeon: Burnell Blanks, MD;  Location[facility];  Service: Cardiovascular;  Laterality: N/A;  . lumbar epidural steroid injection     lumber spinal stenosis  . LUMBAR LAMINECTOMY/DECOMPRESSION MICRODISCECTOMY Right 03/01/2018   Procedure: RIGHT LUMBAR FOUR- LUMBAR FIVE LAMINECTOMY/MICRODISCECTOMY;  Surgeon: [name], MD;  Location: [place];  Service: Neurosurgery;  Laterality: Right;  . POLYPECTOMY    . PORTACATH PLACEMENT Right 12/11/2013   Procedure: INSERTION PORT-A-CATH;  Surgeon: Edward Jolly, MD;  Location: [place];  Service: General;  Laterality: Right;  Subclavian Vein;   . RIGHT/LEFT HEART CATH AND CORONARY ANGIOGRAPHY N/A 11/03/2018   Procedure: RIGHT/LEFT HEART CATH AND CORONARY ANGIOGRAPHY;  Surgeon: Larey Dresser, MD;  Location: [place] CV LAB;  Service: Cardiovascular;  Laterality: N/A;  .  TEE WITHOUT CARDIOVERSION N/A 10/17/2018   Procedure: TRANSESOPHAGEAL ECHOCARDIOGRAM (TEE);  Surgeon: Larey Dresser, MD;  Location: [facility]OSCOPY;  Service: Cardiovascular;  Laterality: N/A;  . TEE WITHOUT CARDIOVERSION N/A 11/08/2018   Procedure: TRANSESOPHAGEAL ECHOCARDIOGRAM (TEE);  Surgeon: Larey Dresser, MD;  Location: [facility] ENDOSCOPY;  Service: Cardiovascular;  Laterality: N/A;  . TOTAL KNEE ARTHROPLASTY Right 10/24/2017   Procedure: TOTAL RIGHT KNEE ARTHROPLASTY;  Surgeon: Gaynelle Arabian, MD;  Location: [facility];  Service: Orthopedics;  Laterality: Right;    Current Medications: Current Meds  Medication Sig  . amiodarone (PACERONE) 200 MG tablet 400mg  (2 tabs) twice daily for 1 week. 200mg  (1 tab) twice daily for 1 week. Then 200mg  (1 tab) daily.  [place] Kitchen apixaban (ELIQUIS) 5 MG TABS tablet Take 1 tablet (5 mg total) by mouth 2 (two) times daily.  . Calcium Carbonate-Vitamin D (CALCIUM-D PO) Take 1 tablet by mouth daily.  [place] Kitchen diltiazem (CARDIZEM CD) 120 MG 24 hr capsule Take 1 capsule (120 mg total) by mouth daily.  [place] Kitchen exemestane (AROMASIN) 25 MG tablet Take 1 tablet (25 mg total) by mouth  daily after breakfast.  . ezetimibe (ZETIA) 10 MG tablet TAKE 1 TABLET BY MOUTH ONCE A DAY  . furosemide (LASIX) 40 MG tablet Take 1 tablet (40 mg total) by mouth daily.  [place] Kitchen levothyroxine (SYNTHROID, LEVOTHROID) 137 MCG tablet Take 1 tablet (137 mcg total) by mouth daily before breakfast.  . metoprolol succinate (TOPROL-XL) 50 MG 24 hr tablet TAKE 1 TABLET BY MOUTH TWICE (2) DAILY WITH OR IMMEDIATELY FOLLOWING A MEAL  . Multiple Vitamin (MULTIVITAMIN WITH MINERALS) TABS tablet Take 1 tablet by mouth daily.  . potassium chloride (K-DUR) 10 MEQ tablet Take 2 tablets (20 mEq total) by mouth daily.     Allergies:   Levaquin [levofloxacin]; Penicillins; Propoxyphene n-acetaminophen; Statins; Cortisone; Evolocumab; Myrbetriq [mirabegron]; and Pseudoephedrine   Social History   Socioeconomic History  . Marital  status: Married    Spouse name: Not on file  . Number of children: 2  . Years of education: Not on file  . Highest education level: Not on file  Occupational History  . Occupation: Retired-school system  Social Needs  . Financial resource strain: Not on file  . Food insecurity:    Worry: Not on file    Inability: Not on file  . Transportation needs:    Medical: Not on file    Non-medical: Not on file  Tobacco Use  . Smoking status: Never Smoker  . Smokeless tobacco: Never Used  . Tobacco comment: never used tobacco  Substance and Sexual Activity  . Alcohol use: No    Alcohol/week: 0.0 standard drinks  . Drug use: No  . Sexual activity: Not Currently    Birth control/protection: Post-menopausal  Lifestyle  . Physical activity:    Days per week: Not on file    Minutes per session: Not on file  . Stress: Not on file  Relationships  . Social connections:    Talks on phone: Not on file    Gets together: Not on file    Attends religious service: Not on file    Active member of club or organization: Not on file    Attends meetings of clubs or organizations: Not on file    Relationship status: Not on file  Other Topics Concern  . Not on file  Social History Narrative   Lives at home with husband.       Family History: The patient's family history includes Alcohol abuse in her father; Kidney failure in her mother. There is no history of Colon cancer, Rectal cancer, Stomach cancer, or Colon polyps.  ROS:   Please see the history of present illness.    Positive for hearing loss, back pain, dizziness, balance problems, easy bruising.  All other systems reviewed and are negative.  EKGs/Labs/Other Studies Reviewed:    The following studies were reviewed today: Cardiac Catheterization 11/03/2018: Conclusion   1. Well-optimized right/left heart filling pressures, normal pulmonary artery pressure, preserved cardiac output.  2. 60% ostial PDA stenosis, relatively small vessel.    3. 70-80% proximal LAD stenosis after large D1 take-off.    TEE 10/17/2018: Study Conclusions  - Left ventricle: Normal left ventricular size with moderate-severe   asymmetric septal hypertrophy. There did not appear to be a   significant gradient across the aortic valve/LV outflow tract. - Aortic valve: There was no stenosis. - Mitral valve: There was prolapse of the posterior mitral leaflet.   There was minimal systolic anterior motion of the anterior mitral   leaflet. There was eccentric, anteriorly-directed mitral   regurgitation.  Vena contracta 0.6 cm. Unable to do PISA given   eccentricity of jet. Visually, I think that MR was severe. There   was flattening/slight flow reversal in the pulmonary vein   systolic doppler signal. - Left atrium: The atrium was severely dilated. No evidence of   thrombus in the atrial cavity or appendage. - Right ventricle: The cavity size was normal. Systolic function   was normal. - Right atrium: The atrium was mildly dilated. - Atrial septum: No PFO or ASD by color doppler. - Tricuspid valve: Peak RV-RA gradient (S): 44 mm Hg.  Impressions:  - 1. No LA appendage thrombus.   2. Suspect severe MR. I think the mechanism is most likely mitral   valve prolapse. There appeared to be only minimal SAM.   EKG:  EKG is not ordered today.    Recent Labs: 10/16/2018: Magnesium 2.1 10/18/2018: B Natriuretic Peptide 327.3 11/17/2018: ALT 8; BUN 31; Creatinine 1.82; Hemoglobin 12.9; Platelet Count 148; Potassium 4.0; Sodium 137 11/20/2018: TSH 1.24  Recent Lipid Panel    Component Value Date/Time   CHOL 134 10/15/2018 0355   TRIG 56 10/15/2018 0355   HDL 28 (L) 10/15/2018 0355   CHOLHDL 4.8 10/15/2018 0355   VLDL 11 10/15/2018 0355   LDLCALC 95 10/15/2018 0355   LDLDIRECT 121.2 07/24/2014 0900    Physical Exam:    VS:  BP 108/72   Pulse 76   Ht 5\' 8"  (1.727 m)   Wt 138 lb 12.8 oz (63 kg)   LMP 10/19/1991   SpO2 98%   BMI 21.10 kg/m      Wt Readings from Last 3 Encounters:  11/29/18 138 lb 12.8 oz (63 kg)  11/20/18 139 lb (63 kg)  11/17/18 139 lb 4 oz (63.2 kg)     GEN: Well nourished, well developed elderly woman in no acute distress HEENT: Normal NECK: No JVD; No carotid bruits LYMPHATICS: No lymphadenopathy CARDIAC: RRR, 3/6 holosystolic murmur at the apex RESPIRATORY:  Clear to auscultation without rales, wheezing or rhonchi  ABDOMEN: Soft, non-tender, non-distended MUSCULOSKELETAL:  No edema; No deformity  SKIN: Warm and dry NEUROLOGIC:  Alert and oriented x 3 PSYCHIATRIC:  Normal affect   STS Risk Calculator  Procedure: MV Repair + CAB CALCULATE   Risk of Mortality:  6.281% Renal Failure:  5.656% Permanent Stroke:  2.181% Prolonged Ventilation:  15.660% DSW Infection:  0.089% Reoperation:  6.105% Morbidity or Mortality:  25.206% Short Length of Stay:  7.828% Long Length of Stay:  14.717%   Procedure: MVR + CAB CALCULATE   Risk of Mortality:  9.892% Renal Failure:  7.395% Permanent Stroke:  3.117% Prolonged Ventilation:  21.771% DSW Infection:  0.121% Reoperation:  7.247% Morbidity or Mortality:  31.171% Short Length of Stay:  6.156% Long Length of Stay:  19.640%  ASSESSMENT:    1. Severe mitral regurgitation   2. Paroxysmal atrial fibrillation (HCC)   3. Coronary artery disease involving native coronary artery of native heart without angina pectoris    PLAN:    In order of problems listed above:  The patient has severe, stage D1 primary mitral regurgitation.  This is associated with new onset atrial fibrillation that is been somewhat refractory to cardioversion and now requiring amiodarone to maintain sinus rhythm.  She describes New York Heart Association functional class IIIb symptoms of chronic diastolic heart failure with shortness of breath at low level activity.  I have personally reviewed the patient's cardiac catheterization and TEE studies.  She  has  significant coronary artery disease affecting the mid LAD and PDA branch of the RCA.  Both vessels are small in caliber and not particularly favorable for PCI.  Considering her lack of anginal symptoms, preserved LV function, and small caliber coronary vessels, I am not inclined to proceed with PCI.  Her TEE images are also reviewed and demonstrate preserved LV systolic function with severe mitral regurgitation related to prolapse of the P2 scallop of the posterior leaflet.  She has severe eccentric mitral regurgitation with blunting of pulmonary vein flow.  She has undergone formal cardiac surgical evaluation with Dr. Ihor Gully.  She would require mitral valve repair, coronary artery bypass, and surgical maze.  She is felt to be at high risk of a prolonged recovery with her advanced age and severe back issues.  She has decided that she would not want to proceed with cardiac surgery.  I think transcatheter mitral valve repair with MitraClip is a reasonable treatment strategy in this patient with mitral valve anatomy amenable to edge to edge repair. I have reviewed the natural history of mitral regurgitation with the patient and their family members who are present today. We have discussed the limitations of medical therapy and the poor prognosis associated with symptomatic mitral regurgitation. We have also reviewed potential treatment options, including palliative medical therapy, conventional surgical mitral valve repair or replacement, and percutaneous mitral valve therapies such as edge-to-edge mitral valve approximation with MitraClip. We discussed treatment options in the context of this patient's specific comorbid medical conditions.   The patient is counseled specifically about the risks, indications, and alternatives to percutaneous mitral valve repair with MitraClip. Specific risks include vascular injury, bleeding, infection, arrhythmia, myocardial infarction, stroke, cardiac perforation, cardiac  tamponade, device embolization, single leaflet detachment, endocarditis, mitral valve injury, emergency surgery, and death. They understand the risk of serious complication occurs at a rate of approximately 2%. After this discussion, the patient provides full informed consent for surgery.   She would like to proceed and is scheduled for March 4th. She is instructed to hold apixaban for 48 hours prior to the procedure.   Medication Adjustments/Labs and Tests Ordered: Current medicines are reviewed at length with the patient today.  Concerns regarding medicines are outlined above.  No orders of the defined types were placed in this encounter.  No orders of the defined types were placed in this encounter.   Patient Instructions  You are scheduled for Pre Admission Testing on Tuesday, December 05, 2018 at 9:00 AM. Please arrive in Admitting at [facility] (Main Entrance A, Valet Parking) at 8:45 AM for check-in. No restrictions for this appointment.    MitraClip instructions: Please arrive in Admitting at [facility] on Wednesday, December 06, 2018 at 10:00 AM for check-in.    Have nothing to eat or drink after midnight the night before surgery.    Continue taking all current medications without change through the day before surgery with the following exceptions:  1) Stop Eliquis on 12/04/2018, you will take your last dose of Eliquis on 12/03/2018. You will be advised after the procedure when to restart this medication.   2) On the morning of surgery do not take Furosemide and Potassium Chloride. You can take all other morning medications with a few sips of water.     Signed, Sherren Mocha, MD  11/29/2018 4:58 PM    Kim [name]

## 2018-11-29 NOTE — Progress Notes (Signed)
Cardiology Office Note:    Date:  11/29/2018   ID:  Kim [name], DOB [birth year], MRN [id number]  PCP:  Laurey Morale, MD  Cardiologist:  No primary care provider on file.  Electrophysiologist:  None   Referring MD: Laurey Morale, MD   Chief Complaint  Patient presents with  . Shortness of Breath    History of Present Illness:    Kim [name] is a 84 y.o. female with a hx of coronary artery disease, hypertrophic cardiomyopathy, paroxysmal atrial fibrillation, presenting for evaluation of treatment options related to severe mitral regurgitation.  The patient developed symptoms of unstable angina in 2015 and was treated with stenting of the right coronary artery.  She has been followed for hypertrophic cardiomyopathy with SAM and LV outflow tract obstruction.  Her gradients improved with beta-blockade.  The patient presented in January 2020 with atrial fibrillation with RVR.  She underwent TEE directed cardioversion and was started on apixaban for anticoagulation.  She was found to have posterior leaflet prolapse of the mitral valve with severe associated mitral regurgitation at that time.  She was converted to normal sinus rhythm but reverted to atrial fibrillation.  She then was loaded with amiodarone and underwent repeat cardioversion.  The patient is here with her husband and her daughter today.  She continues to have symptoms of shortness of breath with exertion, describing New York Heart Association functional class III symptoms at present.  Also complains of generalized fatigue.  She denies orthopnea, PND, or leg swelling.  She has had no chest pain or chest pressure.  She denies lightheadedness or frank syncope.  The patient lives independently in [place] with her husband. She is a retired Pharmacist, hospital.  She otherwise has been functionally independent.  She had a back surgery last year and has struggled with her mobility since that time.  She complains of gait unsteadiness.  She  has not had any recent falls.  She is tolerating oral anticoagulation without bleeding problems.  Past Medical History:  Diagnosis Date  . Allergic rhinitis   . Allergy   . Arthritis    hands  . Breast cancer of upper-outer quadrant of left female breast ([place]) 11/08/13   finished chemo and radiation 2015  . CAD (coronary artery disease)    a. 12/2013 Cath/PCI: LM nl, LAD 20p, 40m, 30d, D1 30p, LCX 20p, 23m, OM1 20, Mo2 40p, RCA 30p, 15m (4.0x16 Promus Premier DES), 20d, RPL 30, RPDA 70p, 4m, EF 65-70%.  . Diverticulosis   . HOH (hard of hearing)    bilateral, wears hearing aids  . Hx of adenomatous colonic polyps 02/1999  . Hyperlipidemia   . Hypertension   . Hypertrophic obstructive cardiomyopathy (HOCM) [facility])    Cardiologist is Dr. [name]  . Hypothyroidism   . Iron deficiency anemia due to chronic blood loss 01/07/2016  . Low back pain    gets ESI from Dr. Rennis [place]  . Malabsorption of iron 01/07/2016  . Myocardial infarction ([place]) 12/2013   very mild-with first chemo -placed stent x1  . Persistent atrial fibrillation with rapid ventricular response 10/14/2018  . Personal history of radiation therapy 2015  . S/P radiation therapy 05/14/2014-06/26/2014   1) Left Breast  / 50 Gy in 25 fractions, 2) Left Supraclavicular fossa/ 47.5 Gy in 25 fractions, 3) Left Posterior Axillary boost / 4.825 Gy in 25 fractions, 4) Left Breast boost / 10 Gy in 5 fractions   . Tubulovillous adenoma of colon 12/2009  with HGD  . Wears hearing aid    left and right     Past Surgical History:  Procedure Laterality Date  . ABDOMINAL HYSTERECTOMY    . ANTERIOR AND POSTERIOR REPAIR N/A 01/17/2015   Procedure: CYSTOCELE REPAIR ;  Surgeon: Princess Bruins, MD;  Location: [facility];  Service: Gynecology;  Laterality: N/A;  . BILATERAL SALPINGOOPHORECTOMY     see Laurin Coder NP for GYN exams  . BREAST BIOPSY Left 10/18/2013  . BREAST BIOPSY Left 10/31/2013  . BREAST LUMPECTOMY Left 11/05/2013  . BREAST  LUMPECTOMY WITH NEEDLE LOCALIZATION AND AXILLARY LYMPH NODE DISSECTION Left 11/05/2013   Procedure: LEFT BREAST LUMPECTOMY WITH NEEDLE LOCALIZATION and axillary lymph Node Dissection;  Surgeon: Edward Jolly, MD;  Location: [place];  Service: General;  Laterality: Left;  . BREAST SURGERY  11/2013   left lumpectomy  . CARDIOVERSION N/A 10/17/2018   Procedure: CARDIOVERSION;  Surgeon: Larey Dresser, MD;  Location: [facility];  Service: Cardiovascular;  Laterality: N/A;  . CARDIOVERSION N/A 11/08/2018   Procedure: CARDIOVERSION;  Surgeon: Larey Dresser, MD;  Location: [place];  Service: Cardiovascular;  Laterality: N/A;  . CATARACT EXTRACTION W/ INTRAOCULAR LENS  IMPLANT, BILATERAL  2012   bilateral  . COLONOSCOPY  11/04/2015   per Dr. Fuller Plan, adenomatous polyps, no repeats planned   . EYE SURGERY  11/22/2006   cataracts, bilateral, intraocular lens implant  . JOINT REPLACEMENT     2019  . LEFT HEART CATHETERIZATION WITH CORONARY ANGIOGRAM N/A 12/19/2013   Procedure: LEFT HEART CATHETERIZATION WITH CORONARY ANGIOGRAM;  Surgeon: Burnell Blanks, MD;  Location: [facility] CATH LAB;  Service: Cardiovascular;  Laterality: N/A;  . lumbar epidural steroid injection     lumber spinal stenosis  . LUMBAR LAMINECTOMY/DECOMPRESSION MICRODISCECTOMY Right 03/01/2018   Procedure: RIGHT LUMBAR FOUR- LUMBAR FIVE LAMINECTOMY/MICRODISCECTOMY;  Surgeon: [name], MD;  Location: [place];  Service: Neurosurgery;  Laterality: Right;  . POLYPECTOMY    . PORTACATH PLACEMENT Right 12/11/2013   Procedure: INSERTION PORT-A-CATH;  Surgeon: Edward Jolly, MD;  Location: [place];  Service: General;  Laterality: Right;  Subclavian Vein;   . RIGHT/LEFT HEART CATH AND CORONARY ANGIOGRAPHY N/A 11/03/2018   Procedure: RIGHT/LEFT HEART CATH AND CORONARY ANGIOGRAPHY;  Surgeon: Larey Dresser, MD;  Location: [place] [facility] LAB;  Service: Cardiovascular;  Laterality: N/A;  .  TEE WITHOUT CARDIOVERSION N/A 10/17/2018   Procedure: TRANSESOPHAGEAL ECHOCARDIOGRAM (TEE);  Surgeon: Larey Dresser, MD;  Location: [facility];  Service: Cardiovascular;  Laterality: N/A;  . TEE WITHOUT CARDIOVERSION N/A 11/08/2018   Procedure: TRANSESOPHAGEAL ECHOCARDIOGRAM (TEE);  Surgeon: Larey Dresser, MD;  Location: [facility] ENDOSCOPY;  Service: Cardiovascular;  Laterality: N/A;  . TOTAL KNEE ARTHROPLASTY Right 10/24/2017   Procedure: TOTAL RIGHT KNEE ARTHROPLASTY;  Surgeon: Gaynelle Arabian, MD;  Location: [facility];  Service: Orthopedics;  Laterality: Right;    Current Medications: Current Meds  Medication Sig  . amiodarone (PACERONE) 200 MG tablet 400mg  (2 tabs) twice daily for 1 week. 200mg  (1 tab) twice daily for 1 week. Then 200mg  (1 tab) daily.  Marland Kitchen apixaban (ELIQUIS) 5 MG TABS tablet Take 1 tablet (5 mg total) by mouth 2 (two) times daily.  . Calcium Carbonate-Vitamin D (CALCIUM-D PO) Take 1 tablet by mouth daily.  Marland Kitchen diltiazem (CARDIZEM CD) 120 MG 24 hr capsule Take 1 capsule (120 mg total) by mouth daily.  Marland Kitchen exemestane (AROMASIN) 25 MG tablet Take 1 tablet (25 mg total) by mouth  daily after breakfast.  . ezetimibe (ZETIA) 10 MG tablet TAKE 1 TABLET BY MOUTH ONCE A DAY  . furosemide (LASIX) 40 MG tablet Take 1 tablet (40 mg total) by mouth daily.  Marland Kitchen levothyroxine (SYNTHROID, LEVOTHROID) 137 MCG tablet Take 1 tablet (137 mcg total) by mouth daily before breakfast.  . metoprolol succinate (TOPROL-XL) 50 MG 24 hr tablet TAKE 1 TABLET BY MOUTH TWICE (2) DAILY WITH OR IMMEDIATELY FOLLOWING A MEAL  . Multiple Vitamin (MULTIVITAMIN WITH MINERALS) TABS tablet Take 1 tablet by mouth daily.  . potassium chloride (K-DUR) 10 MEQ tablet Take 2 tablets (20 mEq total) by mouth daily.     Allergies:   Levaquin [levofloxacin]; Penicillins; Propoxyphene n-acetaminophen; Statins; Cortisone; Evolocumab; Myrbetriq [mirabegron]; and Pseudoephedrine   Social History   Socioeconomic History  . Marital  status: Married    Spouse name: Not on file  . Number of children: 2  . Years of education: Not on file  . Highest education level: Not on file  Occupational History  . Occupation: Retired-school system  Social Needs  . Financial resource strain: Not on file  . Food insecurity:    Worry: Not on file    Inability: Not on file  . Transportation needs:    Medical: Not on file    Non-medical: Not on file  Tobacco Use  . Smoking status: Never Smoker  . Smokeless tobacco: Never Used  . Tobacco comment: never used tobacco  Substance and Sexual Activity  . Alcohol use: No    Alcohol/week: 0.0 standard drinks  . Drug use: No  . Sexual activity: Not Currently    Birth control/protection: Post-menopausal  Lifestyle  . Physical activity:    Days per week: Not on file    Minutes per session: Not on file  . Stress: Not on file  Relationships  . Social connections:    Talks on phone: Not on file    Gets together: Not on file    Attends religious service: Not on file    Active member of club or organization: Not on file    Attends meetings of clubs or organizations: Not on file    Relationship status: Not on file  Other Topics Concern  . Not on file  Social History Narrative   Lives at home with husband.       Family History: The patient's family history includes Alcohol abuse in her father; Kidney failure in her mother. There is no history of Colon cancer, Rectal cancer, Stomach cancer, or Colon polyps.  ROS:   Please see the history of present illness.    Positive for hearing loss, back pain, dizziness, balance problems, easy bruising.  All other systems reviewed and are negative.  EKGs/Labs/Other Studies Reviewed:    The following studies were reviewed today: Cardiac Catheterization 11/03/2018: Conclusion   1. Well-optimized right/left heart filling pressures, normal pulmonary artery pressure, preserved cardiac output.  2. 60% ostial PDA stenosis, relatively small vessel.    3. 70-80% proximal LAD stenosis after large D1 take-off.    TEE 10/17/2018: Study Conclusions  - Left ventricle: Normal left ventricular size with moderate-severe   asymmetric septal hypertrophy. There did not appear to be a   significant gradient across the aortic valve/LV outflow tract. - Aortic valve: There was no stenosis. - Mitral valve: There was prolapse of the posterior mitral leaflet.   There was minimal systolic anterior motion of the anterior mitral   leaflet. There was eccentric, anteriorly-directed mitral   regurgitation.  Vena contracta 0.6 cm. Unable to do PISA given   eccentricity of jet. Visually, I think that MR was severe. There   was flattening/slight flow reversal in the pulmonary vein   systolic doppler signal. - Left atrium: The atrium was severely dilated. No evidence of   thrombus in the atrial cavity or appendage. - Right ventricle: The cavity size was normal. Systolic function   was normal. - Right atrium: The atrium was mildly dilated. - Atrial septum: No PFO or ASD by color doppler. - Tricuspid valve: Peak RV-RA gradient (S): 44 mm Hg.  Impressions:  - 1. No LA appendage thrombus.   2. Suspect severe MR. I think the mechanism is most likely mitral   valve prolapse. There appeared to be only minimal SAM.   EKG:  EKG is not ordered today.    Recent Labs: 10/16/2018: Magnesium 2.1 10/18/2018: B Natriuretic Peptide 327.3 11/17/2018: ALT 8; BUN 31; Creatinine 1.82; Hemoglobin 12.9; Platelet Count 148; Potassium 4.0; Sodium 137 11/20/2018: TSH 1.24  Recent Lipid Panel    Component Value Date/Time   CHOL 134 10/15/2018 0355   TRIG 56 10/15/2018 0355   HDL 28 (L) 10/15/2018 0355   CHOLHDL 4.8 10/15/2018 0355   VLDL 11 10/15/2018 0355   LDLCALC 95 10/15/2018 0355   LDLDIRECT 121.2 07/24/2014 0900    Physical Exam:    VS:  BP 108/72   Pulse 76   Ht 5\' 8"  (1.727 m)   Wt 138 lb 12.8 oz (63 kg)   LMP 10/19/1991   SpO2 98%   BMI 21.10 kg/m      Wt Readings from Last 3 Encounters:  11/29/18 138 lb 12.8 oz (63 kg)  11/20/18 139 lb (63 kg)  11/17/18 139 lb 4 oz (63.2 kg)     GEN: Well nourished, well developed elderly woman in no acute distress HEENT: Normal NECK: No JVD; No carotid bruits LYMPHATICS: No lymphadenopathy CARDIAC: RRR, 3/6 holosystolic murmur at the apex RESPIRATORY:  Clear to auscultation without rales, wheezing or rhonchi  ABDOMEN: Soft, non-tender, non-distended MUSCULOSKELETAL:  No edema; No deformity  SKIN: Warm and dry NEUROLOGIC:  Alert and oriented x 3 PSYCHIATRIC:  Normal affect   STS Risk Calculator  Procedure: MV Repair + CAB CALCULATE   Risk of Mortality:  6.281% Renal Failure:  5.656% Permanent Stroke:  2.181% Prolonged Ventilation:  15.660% DSW Infection:  0.089% Reoperation:  6.105% Morbidity or Mortality:  25.206% Short Length of Stay:  7.828% Long Length of Stay:  14.717%   Procedure: MVR + CAB CALCULATE   Risk of Mortality:  9.892% Renal Failure:  7.395% Permanent Stroke:  3.117% Prolonged Ventilation:  21.771% DSW Infection:  0.121% Reoperation:  7.247% Morbidity or Mortality:  31.171% Short Length of Stay:  6.156% Long Length of Stay:  19.640%  ASSESSMENT:    1. Severe mitral regurgitation   2. Paroxysmal atrial fibrillation (HCC)   3. Coronary artery disease involving native coronary artery of native heart without angina pectoris    PLAN:    In order of problems listed above:  The patient has severe, stage D1 primary mitral regurgitation.  This is associated with new onset atrial fibrillation that is been somewhat refractory to cardioversion and now requiring amiodarone to maintain sinus rhythm.  She describes New York Heart Association functional class IIIb symptoms of chronic diastolic heart failure with shortness of breath at low level activity.  I have personally reviewed the patient's cardiac catheterization and TEE studies.  She  has  significant coronary artery disease affecting the mid LAD and PDA branch of the RCA.  Both vessels are small in caliber and not particularly favorable for PCI.  Considering her lack of anginal symptoms, preserved LV function, and small caliber coronary vessels, I am not inclined to proceed with PCI.  Her TEE images are also reviewed and demonstrate preserved LV systolic function with severe mitral regurgitation related to prolapse of the P2 scallop of the posterior leaflet.  She has severe eccentric mitral regurgitation with blunting of pulmonary vein flow.  She has undergone formal cardiac surgical evaluation with Dr. Ihor Gully.  She would require mitral valve repair, coronary artery bypass, and surgical maze.  She is felt to be at high risk of a prolonged recovery with her advanced age and severe back issues.  She has decided that she would not want to proceed with cardiac surgery.  I think transcatheter mitral valve repair with MitraClip is a reasonable treatment strategy in this patient with mitral valve anatomy amenable to edge to edge repair. I have reviewed the natural history of mitral regurgitation with the patient and their family members who are present today. We have discussed the limitations of medical therapy and the poor prognosis associated with symptomatic mitral regurgitation. We have also reviewed potential treatment options, including palliative medical therapy, conventional surgical mitral valve repair or replacement, and percutaneous mitral valve therapies such as edge-to-edge mitral valve approximation with MitraClip. We discussed treatment options in the context of this patient's specific comorbid medical conditions.   The patient is counseled specifically about the risks, indications, and alternatives to percutaneous mitral valve repair with MitraClip. Specific risks include vascular injury, bleeding, infection, arrhythmia, myocardial infarction, stroke, cardiac perforation, cardiac  tamponade, device embolization, single leaflet detachment, endocarditis, mitral valve injury, emergency surgery, and death. They understand the risk of serious complication occurs at a rate of approximately 2%. After this discussion, the patient provides full informed consent for surgery.   She would like to proceed and is scheduled for March 4th. She is instructed to hold apixaban for 48 hours prior to the procedure.   Medication Adjustments/Labs and Tests Ordered: Current medicines are reviewed at length with the patient today.  Concerns regarding medicines are outlined above.  No orders of the defined types were placed in this encounter.  No orders of the defined types were placed in this encounter.   Patient Instructions  You are scheduled for Pre Admission Testing on Tuesday, December 05, 2018 at 9:00 AM. Please arrive in Admitting at [facility] (Main Entrance A, Valet Parking) at 8:45 AM for check-in. No restrictions for this appointment.    MitraClip instructions: Please arrive in Admitting at [facility] on Wednesday, December 06, 2018 at 10:00 AM for check-in.    Have nothing to eat or drink after midnight the night before surgery.    Continue taking all current medications without change through the day before surgery with the following exceptions:  1) Stop Eliquis on 12/04/2018, you will take your last dose of Eliquis on 12/03/2018. You will be advised after the procedure when to restart this medication.   2) On the morning of surgery do not take Furosemide and Potassium Chloride. You can take all other morning medications with a few sips of water.     Signed, Sherren Mocha, MD  11/29/2018 4:58 PM    [place]

## 2018-11-30 ENCOUNTER — Other Ambulatory Visit: Payer: Self-pay

## 2018-11-30 DIAGNOSIS — I340 Nonrheumatic mitral (valve) insufficiency: Secondary | ICD-10-CM

## 2018-12-01 ENCOUNTER — Telehealth: Payer: Self-pay | Admitting: Cardiovascular Disease

## 2018-12-01 NOTE — Telephone Encounter (Signed)
Kim [name] wants to let Dr. Burt Knack know she has thought about her options and now would like to have open heart surgery if something goes wrong during her procedure next week.  She was grateful for call back.

## 2018-12-01 NOTE — Telephone Encounter (Signed)
Ok thanks 

## 2018-12-01 NOTE — Telephone Encounter (Signed)
New Message   PT called to speak with [facility]. She did not tell me why she wanted to speak with her and hung up before I could ask.  Please call back

## 2018-12-04 ENCOUNTER — Ambulatory Visit: Payer: Medicare Other | Admitting: Thoracic Surgery (Cardiothoracic Vascular Surgery)

## 2018-12-04 NOTE — Progress Notes (Addendum)
PCP - Alysia Penna, MD Cardiologist - Dr. [name]  Chest x-ray - 12/05/2018 in EPIC EKG - 11/15/2018 in EPIC  Stress Test - 11/08/2018 in EPIC ECHO - 11/08/2018 in EPIC  Cardiac Cath - 11/03/2018 in EPIC  Sleep Study - n/a CPAP - n/a  Fasting Blood Sugar - n/a Checks Blood Sugar _____ times a day-n/a  Blood Thinner Instructions: Eliquis-last dose 12/03/2018 Aspirin Instructions: n.a  Anesthesia review: Yes-heart hx  Patient denies shortness of breath, fever, cough and chest pain at PAT appointment  Patient verbalized understanding of instructions that were given to them at the PAT appointment. Patient was also instructed that they will need to review over the PAT instructions again at home before surgery.

## 2018-12-04 NOTE — Pre-Procedure Instructions (Signed)
Kim [name]  12/04/2018      [place] [place] - [address] [place] [address] Phone: [phone] Fax: [phone]    Your procedure is scheduled on December 06, 2018.  Report to [facility] Entrance "A" at 630 AM.  Call this number if you have problems the morning of surgery:  [phone]   Remember:  Do not eat or drink after midnight.    Take these medicines the morning of surgery with A SIP OF WATER  amiodarone (PACERONE) diltiazem (CARDIZEM CD)  exemestane (AROMASIN) levothyroxine (SYNTHROID, LEVOTHROID) metoprolol succinate (TOPROL-XL)  Hold Eliquis beginning 12/04/2018 per MD instructions  Beginning now,  STOP taking any Aspirin (unless otherwise instructed by your surgeon), Aleve, Naproxen, Ibuprofen, Motrin, Advil, Goody's, BC's, all herbal medications, fish oil, and all vitamins    Do not wear jewelry, make-up or nail polish.  Do not wear lotions, powders, or perfumes, or deodorant.  Do not shave 48 hours prior to surgery.    Do not bring valuables to the hospital.  [facility] is not responsible for any belongings or valuables.  Contacts, dentures or bridgework may not be worn into surgery.  Leave your suitcase in the car.  After surgery it may be brought to your room.  For patients admitted to the hospital, discharge time will be determined by your treatment team.  Patients discharged the day of surgery will not be allowed to drive home.    [place]- Preparing For Surgery  Before surgery, you can play an important role. Because skin is not sterile, your skin needs to be as free of germs as possible. You can reduce the number of germs on your skin by washing with CHG (chlorahexidine gluconate) Soap before surgery.  CHG is an antiseptic cleaner which kills germs and bonds with the skin to continue killing germs even after washing.    Oral Hygiene is also important to reduce your risk of infection.   Remember - BRUSH YOUR TEETH THE MORNING OF SURGERY WITH YOUR REGULAR TOOTHPASTE  Please do not use if you have an allergy to CHG or antibacterial soaps. If your skin becomes reddened/irritated stop using the CHG.  Do not shave (including legs and underarms) for at least 48 hours prior to first CHG shower. It is OK to shave your face.  Please follow these instructions carefully.   1. Shower the NIGHT BEFORE SURGERY and the MORNING OF SURGERY with CHG.   2. If you chose to wash your hair, wash your hair first as usual with your normal shampoo.  3. After you shampoo, rinse your hair and body thoroughly to remove the shampoo.  4. Use CHG as you would any other liquid soap. You can apply CHG directly to the skin and wash gently with a scrungie or a clean washcloth.   5. Apply the CHG Soap to your body ONLY FROM THE NECK DOWN.  Do not use on open wounds or open sores. Avoid contact with your eyes, ears, mouth and genitals (private parts). Wash Face and genitals (private parts)  with your normal soap.  6. Wash thoroughly, paying special attention to the area where your surgery will be performed.  7. Thoroughly rinse your body with warm water from the neck down.  8. DO NOT shower/wash with your normal soap after using and rinsing off the CHG Soap.  9. Pat yourself dry with a CLEAN TOWEL.  10. Wear CLEAN PAJAMAS to bed the night  before surgery, wear comfortable clothes the morning of surgery  11. Place CLEAN SHEETS on your bed the night of your first shower and DO NOT SLEEP WITH PETS.  Day of Surgery:  Do not apply any deodorants/lotions.  Please wear clean clothes to the hospital/surgery center.   Remember to brush your teeth WITH YOUR REGULAR TOOTHPASTE.  Please read over the following fact sheets that you were given.

## 2018-12-05 ENCOUNTER — Ambulatory Visit (HOSPITAL_COMMUNITY)
Admission: RE | Admit: 2018-12-05 | Discharge: 2018-12-05 | Disposition: A | Discharge status: Home / Self Care | Payer: Medicare Other | Source: Ambulatory Visit | Attending: Cardiovascular Disease | Admitting: Cardiovascular Disease

## 2018-12-05 ENCOUNTER — Encounter (HOSPITAL_COMMUNITY)
Admission: RE | Admit: 2018-12-05 | Discharge: 2018-12-05 | Disposition: A | Discharge status: Home / Self Care | Payer: Medicare Other | Source: Ambulatory Visit | Attending: Cardiovascular Disease | Admitting: Cardiovascular Disease

## 2018-12-05 ENCOUNTER — Encounter (HOSPITAL_COMMUNITY): Payer: Self-pay

## 2018-12-05 ENCOUNTER — Other Ambulatory Visit: Payer: Self-pay

## 2018-12-05 DIAGNOSIS — E039 Hypothyroidism, unspecified: Secondary | ICD-10-CM | POA: Diagnosis present

## 2018-12-05 DIAGNOSIS — I421 Obstructive hypertrophic cardiomyopathy: Secondary | ICD-10-CM | POA: Diagnosis present

## 2018-12-05 DIAGNOSIS — Z811 Family history of alcohol abuse and dependence: Secondary | ICD-10-CM

## 2018-12-05 DIAGNOSIS — Z9221 Personal history of antineoplastic chemotherapy: Secondary | ICD-10-CM

## 2018-12-05 DIAGNOSIS — E785 Hyperlipidemia, unspecified: Secondary | ICD-10-CM | POA: Diagnosis present

## 2018-12-05 DIAGNOSIS — M1990 Unspecified osteoarthritis, unspecified site: Secondary | ICD-10-CM | POA: Diagnosis present

## 2018-12-05 DIAGNOSIS — I341 Nonrheumatic mitral (valve) prolapse: Secondary | ICD-10-CM | POA: Diagnosis present

## 2018-12-05 DIAGNOSIS — Z9861 Coronary angioplasty status: Secondary | ICD-10-CM

## 2018-12-05 DIAGNOSIS — I340 Nonrheumatic mitral (valve) insufficiency: Principal | ICD-10-CM | POA: Diagnosis present

## 2018-12-05 DIAGNOSIS — Z7901 Long term (current) use of anticoagulants: Secondary | ICD-10-CM

## 2018-12-05 DIAGNOSIS — Z96651 Presence of right artificial knee joint: Secondary | ICD-10-CM | POA: Diagnosis present

## 2018-12-05 DIAGNOSIS — I110 Hypertensive heart disease with heart failure: Secondary | ICD-10-CM | POA: Diagnosis present

## 2018-12-05 DIAGNOSIS — I5032 Chronic diastolic (congestive) heart failure: Secondary | ICD-10-CM | POA: Diagnosis present

## 2018-12-05 DIAGNOSIS — Z79899 Other long term (current) drug therapy: Secondary | ICD-10-CM

## 2018-12-05 DIAGNOSIS — H9190 Unspecified hearing loss, unspecified ear: Secondary | ICD-10-CM | POA: Diagnosis present

## 2018-12-05 DIAGNOSIS — Z79890 Hormone replacement therapy (postmenopausal): Secondary | ICD-10-CM

## 2018-12-05 DIAGNOSIS — Z888 Allergy status to other drugs, medicaments and biological substances status: Secondary | ICD-10-CM

## 2018-12-05 DIAGNOSIS — Z006 Encounter for examination for normal comparison and control in clinical research program: Secondary | ICD-10-CM

## 2018-12-05 DIAGNOSIS — Z90710 Acquired absence of both cervix and uterus: Secondary | ICD-10-CM

## 2018-12-05 DIAGNOSIS — Z9841 Cataract extraction status, right eye: Secondary | ICD-10-CM

## 2018-12-05 DIAGNOSIS — Z86010 Personal history of colonic polyps: Secondary | ICD-10-CM

## 2018-12-05 DIAGNOSIS — I25110 Atherosclerotic heart disease of native coronary artery with unstable angina pectoris: Secondary | ICD-10-CM | POA: Diagnosis present

## 2018-12-05 DIAGNOSIS — Z881 Allergy status to other antibiotic agents status: Secondary | ICD-10-CM

## 2018-12-05 DIAGNOSIS — Z853 Personal history of malignant neoplasm of breast: Secondary | ICD-10-CM

## 2018-12-05 DIAGNOSIS — Z961 Presence of intraocular lens: Secondary | ICD-10-CM | POA: Diagnosis present

## 2018-12-05 DIAGNOSIS — I4819 Other persistent atrial fibrillation: Secondary | ICD-10-CM | POA: Diagnosis present

## 2018-12-05 DIAGNOSIS — Z880 Allergy status to penicillin: Secondary | ICD-10-CM

## 2018-12-05 DIAGNOSIS — Z9842 Cataract extraction status, left eye: Secondary | ICD-10-CM

## 2018-12-05 DIAGNOSIS — Z923 Personal history of irradiation: Secondary | ICD-10-CM

## 2018-12-05 LAB — CBC
HCT: 40.6 % (ref 36.0–46.0)
Hemoglobin: 12.4 g/dL (ref 12.0–15.0)
MCH: 30.5 pg (ref 26.0–34.0)
MCHC: 30.5 g/dL (ref 30.0–36.0)
MCV: 100 fL (ref 80.0–100.0)
Platelets: 219 10*3/uL (ref 150–400)
RBC: 4.06 MIL/uL (ref 3.87–5.11)
RDW: 14.8 % (ref 11.5–15.5)
WBC: 5.8 10*3/uL (ref 4.0–10.5)
nRBC: 0 % (ref 0.0–0.2)

## 2018-12-05 LAB — TYPE AND SCREEN
ABO/RH(D): A POS
Antibody Screen: NEGATIVE

## 2018-12-05 LAB — BLOOD GAS, ARTERIAL
Acid-Base Excess: 0.7 mmol/L (ref 0.0–2.0)
Bicarbonate: 24.2 mmol/L (ref 20.0–28.0)
Drawn by: 449841
FIO2: 21
O2 Saturation: 99 %
Patient temperature: 98.6
pCO2 arterial: 35 mmHg (ref 32.0–48.0)
pH, Arterial: 7.455 — ABNORMAL HIGH (ref 7.350–7.450)
pO2, Arterial: 144 mmHg — ABNORMAL HIGH (ref 83.0–108.0)

## 2018-12-05 LAB — COMPREHENSIVE METABOLIC PANEL
ALT: 13 U/L (ref 0–44)
AST: 16 U/L (ref 15–41)
Albumin: 4 g/dL (ref 3.5–5.0)
Alkaline Phosphatase: 59 U/L (ref 38–126)
Anion gap: 7 (ref 5–15)
BUN: 28 mg/dL — ABNORMAL HIGH (ref 8–23)
CO2: 26 mmol/L (ref 22–32)
Calcium: 8.8 mg/dL — ABNORMAL LOW (ref 8.9–10.3)
Chloride: 103 mmol/L (ref 98–111)
Creatinine, Ser: 1.25 mg/dL — ABNORMAL HIGH (ref 0.44–1.00)
GFR calc Af Amer: 46 mL/min — ABNORMAL LOW (ref >60)
GFR calc non Af Amer: 40 mL/min — ABNORMAL LOW (ref >60)
Glucose, Bld: 92 mg/dL (ref 70–99)
Potassium: 4 mmol/L (ref 3.5–5.1)
Sodium: 136 mmol/L (ref 135–145)
Total Bilirubin: 0.6 mg/dL (ref 0.3–1.2)
Total Protein: 6.6 g/dL (ref 6.5–8.1)

## 2018-12-05 LAB — BRAIN NATRIURETIC PEPTIDE: B Natriuretic Peptide: 365.4 pg/mL — ABNORMAL HIGH (ref 0.0–100.0)

## 2018-12-05 LAB — HEMOGLOBIN A1C
Hgb A1c MFr Bld: 5.3 % (ref 4.8–5.6)
Mean Plasma Glucose: 105.41 mg/dL

## 2018-12-05 LAB — PROTIME-INR
INR: 1.2 (ref 0.8–1.2)
Prothrombin Time: 14.7 seconds (ref 11.4–15.2)

## 2018-12-05 LAB — SURGICAL PCR SCREEN
MRSA, PCR: NEGATIVE
Staphylococcus aureus: NEGATIVE

## 2018-12-05 LAB — APTT: aPTT: 31 seconds (ref 24–36)

## 2018-12-05 NOTE — Anesthesia Preprocedure Evaluation (Addendum)
Anesthesia Evaluation  Patient identified by MRN, date of birth, ID band Patient awake    Reviewed: Allergy & Precautions, H&P , NPO status , Patient's Chart, lab work & pertinent test results, reviewed documented beta blocker date and time   Airway Mallampati: II  TM Distance: >3 FB Neck ROM: Full    Dental no notable dental hx. (+) Teeth Intact, Dental Advisory Given   Pulmonary neg pulmonary ROS,    Pulmonary exam normal breath sounds clear to auscultation       Cardiovascular Exercise Tolerance: Good hypertension, On Medications and On Home Beta Blockers + CAD, + Past MI and + Cardiac Stents  + dysrhythmias Atrial Fibrillation + Valvular Problems/Murmurs MR  Rhythm:Regular Rate:Normal + Systolic murmurs    Neuro/Psych negative neurological ROS  negative psych ROS   GI/Hepatic negative GI ROS, Neg liver ROS,   Endo/Other  Hypothyroidism   Renal/GU negative Renal ROS  negative genitourinary   Musculoskeletal  (+) Arthritis , Osteoarthritis,    Abdominal   Peds  Hematology  (+) Blood dyscrasia, anemia ,   Anesthesia Other Findings   Reproductive/Obstetrics negative OB ROS                           Anesthesia Physical Anesthesia Plan  ASA: IV  Anesthesia Plan: General   Post-op Pain Management:    Induction: Intravenous  PONV Risk Score and Plan: 4 or greater and Ondansetron, Dexamethasone and Midazolam  Airway Management Planned: Oral ETT  Additional Equipment: Arterial line  Intra-op Plan:   Post-operative Plan: Extubation in OR  Informed Consent: I have reviewed the patients History and Physical, chart, labs and discussed the procedure including the risks, benefits and alternatives for the proposed anesthesia with the patient or authorized representative who has indicated his/her understanding and acceptance.     Dental advisory given  Plan Discussed with:  CRNA  Anesthesia Plan Comments: (PAT note written 12/05/2018 by Myra Gianotti, PA-C. )      Anesthesia Quick Evaluation

## 2018-12-05 NOTE — Progress Notes (Signed)
Patient unable to provide a urine sample at PAT appointment. I gave her 2 cups of water and we waited and she was still unable to go.  She will bring in urine sample tomorrow morning.  Order entered in system.

## 2018-12-05 NOTE — Progress Notes (Signed)
Anesthesia Chart Review:  Case:  [id number] Date/Time:  12/06/18 0830   Procedure:  MITRAL VALVE REPAIR (N/A )   Anesthesia type:  General   Pre-op diagnosis:  Severe Mitral Regurgitation   Location:  [facility] CATH [facility] 6 / [place] INVASIVE CV LAB   Provider:  Sherren Mocha, MD      DISCUSSION: Patient is a 84 year old female scheduled for the above procedure. She was noted to have severe MR on TEE during admission for afib with RVR 10/2018. She has since underwent DCCV and had evaluations for her mitral valve disease by cardiologist Sherren Mocha, MD and CT surgeon Darylene Price, MD. Patient felt to be at increased risk for traditional MV repair/CABG/MAZE and opted to proceed with MitraClip.  History includes never smoker, HOCM, CAD (MI during Herceptin infusion s/p DES RCA 12/2013), severe mitral regurgitation, afib (afib with RVR s/p DCCV 10/18/18, 11/08/18), HTN, hard of hearing, stage IIA left breast cancer (s/p left lumpectomy 11/05/13; Port-a-cath 12/11/13, completed chemo 04/2014; radiation completed 06/26/14; continue Aromasin x 10 years). She has been intolerant to statins and also to [place]. She is s/p right TKA 10/24/17 and s/p right hemilaminotomy/microdiscectomy 03/01/18.  Patient unable to provide urine specimen for UA, so will have to be collected on the day of surgery.  Last Eliquis 12/03/18.   Anesthesia team to evaluate on the day of surgery.    VS: BP 117/68   Pulse 65   Temp 36.9 C   Resp 20   Ht 5\' 8"  (1.727 m)   Wt 63.2 kg   LMP 10/19/1991   SpO2 99%   BMI 21.18 kg/m    PROVIDERS: Laurey Morale, MD is PCP [name], MD is cardiologist Burney Gauze, MD is HEM-ONC   LABS: Labs reviewed: Acceptable for surgery.  (all labs ordered are listed, but only abnormal results are displayed)  Labs Reviewed  BLOOD GAS, ARTERIAL - Abnormal; Notable for the following components:      Result Value   pH, Arterial 7.455 (*)    pO2, Arterial 144 (*)    All other components within  normal limits  BRAIN NATRIURETIC PEPTIDE - Abnormal; Notable for the following components:   B Natriuretic Peptide 365.4 (*)    All other components within normal limits  COMPREHENSIVE METABOLIC PANEL - Abnormal; Notable for the following components:   BUN 28 (*)    Creatinine, Ser 1.25 (*)    Calcium 8.8 (*)    GFR calc non Af Amer 40 (*)    GFR calc Af Amer 46 (*)    All other components within normal limits  SURGICAL PCR SCREEN  APTT  CBC  HEMOGLOBIN A1C  PROTIME-INR  TYPE AND SCREEN    IMAGES: CXR 12/05/18:  IMPRESSION: 1. No acute cardiopulmonary disease. 2. Mild cardiomegaly.   EKG: 11/15/18: Sinus rhythm with 1st degree A-V block Left axis deviation Septal infarct , age undetermined ST & T wave abnormality, consider lateral ischemia Abnormal ECG No significant change since last tracing Confirmed by Thompson Grayer ([id number]) on 11/15/2018 10:11:00 PM   CV: TEE 11/08/18: Study Conclusions - Left ventricle: Normal left ventricular size with moderate-severe   asymmetric septal hypertrophy. There did not appear to be a   significant gradient across the aortic valve/LV outflow tract.   Systolic function was normal. The estimated ejection fraction was   in the range of 60% to 65%. Wall motion was normal; there were no   regional wall motion abnormalities. - Aortic valve:  There was no stenosis. There was trivial   regurgitation. - Aorta: Normal caliber thoracic aorta with minimal plaque. - Mitral valve: There was prolapse of the posterior mitral leaflet   without frank flail. There was minimal systolic anterior motion   of the anterior mitral leaflet. There was eccentric,   anteriorly-directed mitral regurgitation. Unable to do PISA given   eccentricity of jet. Visually, I think that MR was severe. There   was flattening/slight flow reversal in the pulmonary vein   systolic doppler signal. - Left atrium: The atrium was severely dilated. No evidence of   thrombus in the  atrial cavity or appendage. - Right ventricle: The cavity size was normal. Systolic function   was normal. - Right atrium: The atrium was mildly dilated. - Atrial septum: Possible tiny PFO by color doppler. - Tricuspid valve: There was mild-moderate regurgitation. Peak   RV-RA gradient (S): 37 mm Hg. Impressions: - 1. No LA appendage thrombus, may proceed to DCCV.   2. Suspect severe MR. I think the mechanism is most likely mitral   valve prolapse. There appeared to be only minimal SAM.  Cardiac cath 11/03/18: 1. Well-optimized right/left heart filling pressures, normal pulmonary artery pressure, preserved cardiac output.  2. 60% ostial PDA stenosis, relatively small vessel.  3. 70-80% proximal LAD stenosis after large D1 take-off.    Past Medical History:  Diagnosis Date  . Allergic rhinitis   . Allergy   . Arthritis    hands  . Breast cancer of upper-outer quadrant of left female breast ([place]) 11/08/13   finished chemo and radiation 2015  . CAD (coronary artery disease)    a. 12/2013 Cath/PCI: LM nl, LAD 20p, 64m, 30d, D1 30p, LCX 20p, 30m, OM1 20, Mo2 40p, RCA 30p, 32m (4.0x16 Promus Premier DES), 20d, RPL 30, RPDA 70p, 14m, EF 65-70%.  . Diverticulosis   . HOH (hard of hearing)    bilateral, wears hearing aids  . Hx of adenomatous colonic polyps 02/1999  . Hyperlipidemia   . Hypertension   . Hypertrophic obstructive cardiomyopathy (HOCM) [facility])    Cardiologist is Dr. [name]  . Hypothyroidism   . Iron deficiency anemia due to chronic blood loss 01/07/2016  . Low back pain    gets ESI from Dr. Rennis Harding  . Malabsorption of iron 01/07/2016  . Myocardial infarction ([place]) 12/2013   very mild-with first chemo -placed stent x1  . Persistent atrial fibrillation with rapid ventricular response 10/14/2018  . Personal history of radiation therapy 2015  . S/P radiation therapy 05/14/2014-06/26/2014   1) Left Breast  / 50 Gy in 25 fractions, 2) Left Supraclavicular fossa/ 47.5 Gy in 25  fractions, 3) Left Posterior Axillary boost / 4.825 Gy in 25 fractions, 4) Left Breast boost / 10 Gy in 5 fractions   . Tubulovillous adenoma of colon 12/2009   with HGD  . Wears hearing aid    left and right     Past Surgical History:  Procedure Laterality Date  . ABDOMINAL HYSTERECTOMY    . ANTERIOR AND POSTERIOR REPAIR N/A 01/17/2015   Procedure: CYSTOCELE REPAIR ;  Surgeon: Princess Bruins, MD;  Location: [facility] [facility];  Service: Gynecology;  Laterality: N/A;  . BILATERAL SALPINGOOPHORECTOMY     see Laurin Coder NP for GYN exams  . BREAST BIOPSY Left 10/18/2013  . BREAST BIOPSY Left 10/31/2013  . BREAST LUMPECTOMY Left 11/05/2013  . BREAST LUMPECTOMY WITH NEEDLE LOCALIZATION AND AXILLARY LYMPH NODE DISSECTION Left 11/05/2013   Procedure:  LEFT BREAST LUMPECTOMY WITH NEEDLE LOCALIZATION and axillary lymph Node Dissection;  Surgeon: Edward Jolly, MD;  Location: [place];  Service: General;  Laterality: Left;  . BREAST SURGERY  11/2013   left lumpectomy  . CARDIOVERSION N/A 10/17/2018   Procedure: CARDIOVERSION;  Surgeon: Larey Dresser, MD;  Location: [facility] ENDOSCOPY;  Service: Cardiovascular;  Laterality: N/A;  . CARDIOVERSION N/A 11/08/2018   Procedure: CARDIOVERSION;  Surgeon: Larey Dresser, MD;  Location: [place];  Service: Cardiovascular;  Laterality: N/A;  . CATARACT EXTRACTION W/ INTRAOCULAR LENS  IMPLANT, BILATERAL  2012   bilateral  . COLONOSCOPY  11/04/2015   per Dr. Fuller Plan, adenomatous polyps, no repeats planned   . EYE SURGERY  11/22/2006   cataracts, bilateral, intraocular lens implant  . JOINT REPLACEMENT     2019  . LEFT HEART CATHETERIZATION WITH CORONARY ANGIOGRAM N/A 12/19/2013   Procedure: LEFT HEART CATHETERIZATION WITH CORONARY ANGIOGRAM;  Surgeon: Burnell Blanks, MD;  Location: [facility] CATH LAB;  Service: Cardiovascular;  Laterality: N/A;  . lumbar epidural steroid injection     lumber spinal stenosis  . LUMBAR LAMINECTOMY/DECOMPRESSION  MICRODISCECTOMY Right 03/01/2018   Procedure: RIGHT LUMBAR FOUR- LUMBAR FIVE LAMINECTOMY/MICRODISCECTOMY;  Surgeon: [name], MD;  Location: [place];  Service: Neurosurgery;  Laterality: Right;  . POLYPECTOMY    . PORTACATH PLACEMENT Right 12/11/2013   Procedure: INSERTION PORT-A-CATH;  Surgeon: Edward Jolly, MD;  Location: [place];  Service: General;  Laterality: Right;  Subclavian Vein;   . RIGHT/LEFT HEART CATH AND CORONARY ANGIOGRAPHY N/A 11/03/2018   Procedure: RIGHT/LEFT HEART CATH AND CORONARY ANGIOGRAPHY;  Surgeon: Larey Dresser, MD;  Location: [place] CV LAB;  Service: Cardiovascular;  Laterality: N/A;  . TEE WITHOUT CARDIOVERSION N/A 10/17/2018   Procedure: TRANSESOPHAGEAL ECHOCARDIOGRAM (TEE);  Surgeon: Larey Dresser, MD;  Location: [facility] ENDOSCOPY;  Service: Cardiovascular;  Laterality: N/A;  . TEE WITHOUT CARDIOVERSION N/A 11/08/2018   Procedure: TRANSESOPHAGEAL ECHOCARDIOGRAM (TEE);  Surgeon: Larey Dresser, MD;  Location: [facility] ENDOSCOPY;  Service: Cardiovascular;  Laterality: N/A;  . TOTAL KNEE ARTHROPLASTY Right 10/24/2017   Procedure: TOTAL RIGHT KNEE ARTHROPLASTY;  Surgeon: Gaynelle Arabian, MD;  Location: [facility];  Service: Orthopedics;  Laterality: Right;    MEDICATIONS: . amiodarone (PACERONE) 200 MG tablet  . apixaban (ELIQUIS) 5 MG TABS tablet  . Calcium Carbonate-Vitamin D (CALCIUM-D PO)  . diltiazem (CARDIZEM CD) 120 MG 24 hr capsule  . exemestane (AROMASIN) 25 MG tablet  . ezetimibe (ZETIA) 10 MG tablet  . furosemide (LASIX) 40 MG tablet  . levothyroxine (SYNTHROID, LEVOTHROID) 137 MCG tablet  . metoprolol succinate (TOPROL-XL) 50 MG 24 hr tablet  . Multiple Vitamin (MULTIVITAMIN WITH MINERALS) TABS tablet  . potassium chloride (K-DUR) 10 MEQ tablet   No current facility-administered medications for this encounter.     Myra Gianotti, PA-C Surgical Short Stay/Anesthesiology [facility] Phone [phone] [facility] Phone [phone] 12/05/2018 4:38 PM

## 2018-12-06 ENCOUNTER — Encounter (HOSPITAL_COMMUNITY)
Admission: RE | Disposition: A | Discharge status: Home / Self Care | Payer: Self-pay | Source: Home / Self Care | Attending: Cardiovascular Disease

## 2018-12-06 ENCOUNTER — Inpatient Hospital Stay (HOSPITAL_COMMUNITY): Payer: Medicare Other

## 2018-12-06 ENCOUNTER — Encounter (HOSPITAL_COMMUNITY): Payer: Self-pay

## 2018-12-06 ENCOUNTER — Inpatient Hospital Stay (HOSPITAL_COMMUNITY): Payer: Medicare Other | Admitting: Critical Care Medicine

## 2018-12-06 ENCOUNTER — Inpatient Hospital Stay (HOSPITAL_COMMUNITY)
Admission: RE | Admit: 2018-12-06 | Discharge: 2018-12-07 | DRG: 267 | Disposition: A | Discharge status: Home / Self Care | Payer: Medicare Other | Attending: Cardiovascular Disease | Admitting: Cardiovascular Disease

## 2018-12-06 DIAGNOSIS — I361 Nonrheumatic tricuspid (valve) insufficiency: Secondary | ICD-10-CM | POA: Diagnosis not present

## 2018-12-06 DIAGNOSIS — I340 Nonrheumatic mitral (valve) insufficiency: Secondary | ICD-10-CM

## 2018-12-06 DIAGNOSIS — Z86010 Personal history of colonic polyps: Secondary | ICD-10-CM | POA: Diagnosis not present

## 2018-12-06 DIAGNOSIS — E785 Hyperlipidemia, unspecified: Secondary | ICD-10-CM | POA: Diagnosis present

## 2018-12-06 DIAGNOSIS — I25110 Atherosclerotic heart disease of native coronary artery with unstable angina pectoris: Secondary | ICD-10-CM | POA: Diagnosis present

## 2018-12-06 DIAGNOSIS — Z923 Personal history of irradiation: Secondary | ICD-10-CM | POA: Diagnosis not present

## 2018-12-06 DIAGNOSIS — H9190 Unspecified hearing loss, unspecified ear: Secondary | ICD-10-CM | POA: Diagnosis present

## 2018-12-06 DIAGNOSIS — I2510 Atherosclerotic heart disease of native coronary artery without angina pectoris: Secondary | ICD-10-CM | POA: Diagnosis present

## 2018-12-06 DIAGNOSIS — I5032 Chronic diastolic (congestive) heart failure: Secondary | ICD-10-CM | POA: Diagnosis present

## 2018-12-06 DIAGNOSIS — I421 Obstructive hypertrophic cardiomyopathy: Secondary | ICD-10-CM | POA: Diagnosis present

## 2018-12-06 DIAGNOSIS — E039 Hypothyroidism, unspecified: Secondary | ICD-10-CM | POA: Diagnosis present

## 2018-12-06 DIAGNOSIS — Z9841 Cataract extraction status, right eye: Secondary | ICD-10-CM | POA: Diagnosis not present

## 2018-12-06 DIAGNOSIS — I10 Essential (primary) hypertension: Secondary | ICD-10-CM | POA: Diagnosis present

## 2018-12-06 DIAGNOSIS — I4819 Other persistent atrial fibrillation: Secondary | ICD-10-CM | POA: Diagnosis present

## 2018-12-06 DIAGNOSIS — Z9889 Other specified postprocedural states: Secondary | ICD-10-CM

## 2018-12-06 DIAGNOSIS — C50412 Malignant neoplasm of upper-outer quadrant of left female breast: Secondary | ICD-10-CM | POA: Diagnosis present

## 2018-12-06 DIAGNOSIS — Z961 Presence of intraocular lens: Secondary | ICD-10-CM | POA: Diagnosis present

## 2018-12-06 DIAGNOSIS — I110 Hypertensive heart disease with heart failure: Secondary | ICD-10-CM | POA: Diagnosis present

## 2018-12-06 DIAGNOSIS — Z9842 Cataract extraction status, left eye: Secondary | ICD-10-CM | POA: Diagnosis not present

## 2018-12-06 DIAGNOSIS — Z006 Encounter for examination for normal comparison and control in clinical research program: Secondary | ICD-10-CM | POA: Diagnosis not present

## 2018-12-06 DIAGNOSIS — Z9221 Personal history of antineoplastic chemotherapy: Secondary | ICD-10-CM | POA: Diagnosis not present

## 2018-12-06 DIAGNOSIS — Z90710 Acquired absence of both cervix and uterus: Secondary | ICD-10-CM | POA: Diagnosis not present

## 2018-12-06 DIAGNOSIS — Z853 Personal history of malignant neoplasm of breast: Secondary | ICD-10-CM | POA: Diagnosis not present

## 2018-12-06 HISTORY — PX: MITRAL VALVE REPAIR: CATH118311

## 2018-12-06 LAB — URINALYSIS, ROUTINE W REFLEX MICROSCOPIC
Bilirubin Urine: NEGATIVE
Glucose, UA: NEGATIVE mg/dL
Hgb urine dipstick: NEGATIVE
Ketones, ur: NEGATIVE mg/dL
Leukocytes,Ua: NEGATIVE
Nitrite: POSITIVE — AB
Protein, ur: NEGATIVE mg/dL
Specific Gravity, Urine: 1.015 (ref 1.005–1.030)
pH: 5 (ref 5.0–8.0)

## 2018-12-06 LAB — POCT ACTIVATED CLOTTING TIME
Activated Clotting Time: 252 seconds
Activated Clotting Time: 285 seconds
Activated Clotting Time: 274 seconds

## 2018-12-06 SURGERY — MITRAL VALVE REPAIR
Anesthesia: General

## 2018-12-06 MED ORDER — ADULT MULTIVITAMIN W/MINERALS CH
1.0000 | ORAL_TABLET | Freq: Every day | ORAL | Status: DC
  Administered 2018-12-07: 1 via ORAL
  Filled 2018-12-06: qty 1

## 2018-12-06 MED ORDER — ONDANSETRON HCL 4 MG/2ML IJ SOLN
INTRAMUSCULAR | Status: DC | PRN
  Administered 2018-12-06: 4 mg via INTRAVENOUS

## 2018-12-06 MED ORDER — CHLORHEXIDINE GLUCONATE 4 % EX LIQD: 60.0000 mL | Freq: Once | CUTANEOUS | Status: DC

## 2018-12-06 MED ORDER — ASPIRIN 81 MG PO CHEW
81.0000 mg | CHEWABLE_TABLET | Freq: Every day | ORAL | Status: DC
  Administered 2018-12-06 – 2018-12-07 (×2): 81 mg via ORAL
  Filled 2018-12-06 (×2): qty 1

## 2018-12-06 MED ORDER — EXEMESTANE 25 MG PO TABS
25.0000 mg | ORAL_TABLET | Freq: Every day | ORAL | Status: DC
  Administered 2018-12-07: 25 mg via ORAL
  Filled 2018-12-06: qty 1

## 2018-12-06 MED ORDER — ACETAMINOPHEN 500 MG PO TABS
1000.0000 mg | ORAL_TABLET | ORAL | Status: AC
  Administered 2018-12-06: 1000 mg via ORAL
  Filled 2018-12-06 (×2): qty 2

## 2018-12-06 MED ORDER — FENTANYL CITRATE (PF) 100 MCG/2ML IJ SOLN
INTRAMUSCULAR | Status: DC | PRN
  Administered 2018-12-06 (×2): 50 ug via INTRAVENOUS

## 2018-12-06 MED ORDER — SUGAMMADEX SODIUM 200 MG/2ML IV SOLN
INTRAVENOUS | Status: DC | PRN
  Administered 2018-12-06: 130 mg via INTRAVENOUS

## 2018-12-06 MED ORDER — HEPARIN SODIUM (PORCINE) 1000 UNIT/ML IJ SOLN
INTRAMUSCULAR | Status: DC | PRN
  Administered 2018-12-06: 2000 [IU] via INTRAVENOUS
  Administered 2018-12-06: 6000 [IU] via INTRAVENOUS
  Administered 2018-12-06: 2000 [IU] via INTRAVENOUS

## 2018-12-06 MED ORDER — SODIUM CHLORIDE 0.9 % IV SOLN
INTRAVENOUS | Status: DC | PRN
  Administered 2018-12-06: 25 ug/min via INTRAVENOUS

## 2018-12-06 MED ORDER — LACTATED RINGERS IV SOLN
INTRAVENOUS | Status: DC | PRN
  Administered 2018-12-06: 08:00:00 via INTRAVENOUS

## 2018-12-06 MED ORDER — CALCIUM CARBONATE-VITAMIN D 500-200 MG-UNIT PO TABS
1.0000 | ORAL_TABLET | Freq: Every day | ORAL | Status: DC
  Administered 2018-12-07: 1 via ORAL
  Filled 2018-12-06: qty 1

## 2018-12-06 MED ORDER — PROPOFOL 10 MG/ML IV BOLUS
INTRAVENOUS | Status: DC | PRN
  Administered 2018-12-06: 70 mg via INTRAVENOUS

## 2018-12-06 MED ORDER — EZETIMIBE 10 MG PO TABS
10.0000 mg | ORAL_TABLET | Freq: Every day | ORAL | Status: DC
  Administered 2018-12-07: 10 mg via ORAL
  Filled 2018-12-06: qty 1

## 2018-12-06 MED ORDER — PHENYLEPHRINE 40 MCG/ML (10ML) SYRINGE FOR IV PUSH (FOR BLOOD PRESSURE SUPPORT)
PREFILLED_SYRINGE | INTRAVENOUS | Status: DC | PRN
  Administered 2018-12-06: 80 ug via INTRAVENOUS
  Administered 2018-12-06 (×3): 40 ug via INTRAVENOUS

## 2018-12-06 MED ORDER — LIDOCAINE 2% (20 MG/ML) 5 ML SYRINGE
INTRAMUSCULAR | Status: DC | PRN
  Administered 2018-12-06: 60 mg via INTRAVENOUS

## 2018-12-06 MED ORDER — HEPARIN (PORCINE) IN NACL 2000-0.9 UNIT/L-% IV SOLN
INTRAVENOUS | Status: AC
  Filled 2018-12-06: qty 2000

## 2018-12-06 MED ORDER — HEPARIN (PORCINE) IN NACL 1000-0.9 UT/500ML-% IV SOLN
INTRAVENOUS | Status: AC
  Filled 2018-12-06: qty 500

## 2018-12-06 MED ORDER — AMIODARONE HCL 200 MG PO TABS
200.0000 mg | ORAL_TABLET | Freq: Two times a day (BID) | ORAL | Status: DC
  Administered 2018-12-06 – 2018-12-07 (×2): 200 mg via ORAL
  Filled 2018-12-06 (×2): qty 1

## 2018-12-06 MED ORDER — SODIUM CHLORIDE 0.9% FLUSH
3.0000 mL | Freq: Two times a day (BID) | INTRAVENOUS | Status: DC
  Administered 2018-12-06 (×2): 3 mL via INTRAVENOUS

## 2018-12-06 MED ORDER — APIXABAN 5 MG PO TABS
5.0000 mg | ORAL_TABLET | Freq: Two times a day (BID) | ORAL | Status: DC
  Administered 2018-12-07: 5 mg via ORAL
  Filled 2018-12-06: qty 1

## 2018-12-06 MED ORDER — PROTAMINE SULFATE 10 MG/ML IV SOLN
INTRAVENOUS | Status: DC | PRN
  Administered 2018-12-06: 50 mg via INTRAVENOUS

## 2018-12-06 MED ORDER — ONDANSETRON HCL 4 MG/2ML IJ SOLN: 4.0000 mg | Freq: Four times a day (QID) | INTRAMUSCULAR | Status: DC | PRN

## 2018-12-06 MED ORDER — DEXAMETHASONE SODIUM PHOSPHATE 10 MG/ML IJ SOLN
INTRAMUSCULAR | Status: DC | PRN
  Administered 2018-12-06: 4 mg via INTRAVENOUS

## 2018-12-06 MED ORDER — HEPARIN (PORCINE) IN NACL 1000-0.9 UT/500ML-% IV SOLN
INTRAVENOUS | Status: DC | PRN
  Administered 2018-12-06: 500 mL

## 2018-12-06 MED ORDER — SODIUM CHLORIDE 0.9 % IV SOLN: INTRAVENOUS | Status: DC

## 2018-12-06 MED ORDER — METOPROLOL SUCCINATE ER 50 MG PO TB24
50.0000 mg | ORAL_TABLET | Freq: Two times a day (BID) | ORAL | Status: DC
  Administered 2018-12-07: 50 mg via ORAL
  Filled 2018-12-06: qty 1

## 2018-12-06 MED ORDER — VANCOMYCIN HCL IN DEXTROSE 1-5 GM/200ML-% IV SOLN
INTRAVENOUS | Status: AC
  Filled 2018-12-06: qty 200

## 2018-12-06 MED ORDER — SODIUM CHLORIDE 0.9 % IV SOLN: 250.0000 mL | INTRAVENOUS | Status: DC | PRN

## 2018-12-06 MED ORDER — CHLORHEXIDINE GLUCONATE 0.12 % MT SOLN
15.0000 mL | Freq: Once | OROMUCOSAL | Status: AC
  Administered 2018-12-06: 15 mL via OROMUCOSAL
  Filled 2018-12-06: qty 15

## 2018-12-06 MED ORDER — SODIUM CHLORIDE 0.9% FLUSH: 3.0000 mL | INTRAVENOUS | Status: DC | PRN

## 2018-12-06 MED ORDER — HEPARIN (PORCINE) IN NACL 2000-0.9 UNIT/L-% IV SOLN
INTRAVENOUS | Status: DC | PRN
  Administered 2018-12-06 (×3): 1000 mL

## 2018-12-06 MED ORDER — VANCOMYCIN HCL IN DEXTROSE 1-5 GM/200ML-% IV SOLN: 1000.0000 mg | INTRAVENOUS | Status: DC

## 2018-12-06 MED ORDER — CEFAZOLIN SODIUM-DEXTROSE 2-3 GM-%(50ML) IV SOLR
INTRAVENOUS | Status: DC | PRN
  Administered 2018-12-06: 2 g via INTRAVENOUS

## 2018-12-06 MED ORDER — CHLORHEXIDINE GLUCONATE 4 % EX LIQD: 30.0000 mL | CUTANEOUS | Status: DC

## 2018-12-06 MED ORDER — FUROSEMIDE 40 MG PO TABS
40.0000 mg | ORAL_TABLET | Freq: Every day | ORAL | Status: DC
  Administered 2018-12-07: 40 mg via ORAL
  Filled 2018-12-06: qty 1

## 2018-12-06 MED ORDER — ROCURONIUM BROMIDE 10 MG/ML (PF) SYRINGE
PREFILLED_SYRINGE | INTRAVENOUS | Status: DC | PRN
  Administered 2018-12-06: 50 mg via INTRAVENOUS

## 2018-12-06 MED ORDER — POTASSIUM CHLORIDE ER 10 MEQ PO TBCR
20.0000 meq | EXTENDED_RELEASE_TABLET | Freq: Every day | ORAL | Status: DC
  Administered 2018-12-07: 20 meq via ORAL
  Filled 2018-12-06 (×2): qty 2

## 2018-12-06 MED ORDER — DILTIAZEM HCL ER COATED BEADS 120 MG PO CP24
120.0000 mg | ORAL_CAPSULE | Freq: Every day | ORAL | Status: DC
  Administered 2018-12-07: 120 mg via ORAL
  Filled 2018-12-06: qty 1

## 2018-12-06 MED ORDER — LEVOTHYROXINE SODIUM 25 MCG PO TABS
137.0000 ug | ORAL_TABLET | Freq: Every day | ORAL | Status: DC
  Administered 2018-12-07: 137 ug via ORAL
  Filled 2018-12-06: qty 1

## 2018-12-06 SURGICAL SUPPLY — 18 items
BLANKET WARM UNDERBOD FULL ACC (MISCELLANEOUS) ×1 IMPLANT
CATHETER STEERABLE GUIDE (CATHETERS) ×1 IMPLANT
CLIP MITRACLIP NTR (Prosthesis & Implant Heart) ×2 IMPLANT
DEVICE CLOSURE PERCLS PRGLD 6F (VASCULAR PRODUCTS) IMPLANT
ELECT DEFIB PAD ADLT CADENCE (PAD) ×1 IMPLANT
KIT DILATOR VASC 18G NDL (KITS) ×1 IMPLANT
KIT HEART LEFT (KITS) ×4 IMPLANT
KIT MICROPUNCTURE NIT STIFF (SHEATH) ×1 IMPLANT
MITRACLIP 1 SGC 2 NTR MBR0102 (KITS) ×1 IMPLANT
PACK CARDIAC CATHETERIZATION (CUSTOM PROCEDURE TRAY) ×2 IMPLANT
PERCLOSE PROGLIDE 6F (VASCULAR PRODUCTS) ×4
SHEATH PINNACLE 8F 10CM (SHEATH) ×1 IMPLANT
SHEATH PROBE COVER 6X72 (BAG) ×2 IMPLANT
STOPCOCK MORSE 400PSI 3WAY (MISCELLANEOUS) ×12 IMPLANT
TRANSDUCER W/STOPCOCK (MISCELLANEOUS) ×2 IMPLANT
TUBING ART PRESS 72  MALE/FEM (TUBING) ×1
TUBING ART PRESS 72 MALE/FEM (TUBING) ×1 IMPLANT
WIRE EMERALD 3MM-J .035X150CM (WIRE) ×1 IMPLANT

## 2018-12-06 NOTE — Interval H&P Note (Signed)
History and Physical Interval Note:  12/06/2018 8:24 AM  Kim [name]  has presented today for surgery, with the diagnosis of Severe Mitral Regurgitation  The various methods of treatment have been discussed with the patient and family. After consideration of risks, benefits and other options for treatment, the patient has consented to  Procedure(s): MITRAL VALVE REPAIR (N/A) as a surgical intervention .  The patient's history has been reviewed, patient examined, no change in status, stable for surgery.  I have reviewed the patient's chart and labs.  Questions were answered to the patient's satisfaction.     Sherren Mocha

## 2018-12-06 NOTE — Anesthesia Procedure Notes (Signed)
Arterial Line Insertion Start/End3/01/2019 8:00 AM, 12/06/2018 8:05 AM Performed by: Wilburn Cornelia, CRNA, CRNA  Patient location: Pre-op. Preanesthetic checklist: patient identified, IV checked, site marked, risks and benefits discussed, surgical consent, monitors and equipment checked, pre-op evaluation, timeout performed and anesthesia consent Lidocaine 1% used for infiltration Right, radial was placed Catheter size: 20 G Hand hygiene performed  and maximum sterile barriers used  Allen's test indicative of satisfactory collateral circulation Attempts: 2 Procedure performed without using ultrasound guided technique. Following insertion, Biopatch. Post procedure assessment: normal  Patient tolerated the procedure well with no immediate complications.

## 2018-12-06 NOTE — Anesthesia Postprocedure Evaluation (Signed)
Anesthesia Post Note  Patient: Kim [name]  Procedure(s) Performed: MITRAL VALVE REPAIR (N/A )     Patient location during evaluation: Cath Lab Anesthesia Type: General Level of consciousness: awake and alert Pain management: pain level controlled Vital Signs Assessment: post-procedure vital signs reviewed and stable Respiratory status: spontaneous breathing, nonlabored ventilation and respiratory function stable Cardiovascular status: blood pressure returned to baseline and stable Postop Assessment: no apparent nausea or vomiting Anesthetic complications: no    Last Vitals:  Vitals:   12/06/18 1200 12/06/18 1205  BP: (!) 129/49 119/61  Pulse: (!) 56 (!) 56  Resp: 16 16  Temp: 36.5 C   SpO2: 100% 99%    Last Pain:  Vitals:   12/06/18 1142  TempSrc:   PainSc: 0-No pain                 Martez Weiand,W. EDMOND

## 2018-12-06 NOTE — Progress Notes (Signed)
Paged provider about patient complaining of lower back pain. No PRN's ordered. Elevated HOB to 20 degrees and pillow support under calves to relieved pressure. Awaiting orders.

## 2018-12-06 NOTE — Transfer of Care (Signed)
Immediate Anesthesia Transfer of Care Note  Patient: Kim [name]  Procedure(s) Performed: MITRAL VALVE REPAIR (N/A )  Patient Location: Cath Lab  Anesthesia Type:General  Level of Consciousness: awake, alert  and oriented  Airway & Oxygen Therapy: Patient Spontanous Breathing and Patient connected to nasal cannula oxygen  Post-op Assessment: Report given to RN and Post -op Vital signs reviewed and stable  Post vital signs: Reviewed and stable  Last Vitals:  Vitals Value Taken Time  BP 127/54 12/06/2018 11:29 AM  Temp    Pulse 58 12/06/2018 11:34 AM  Resp 15 12/06/2018 11:34 AM  SpO2 100 % 12/06/2018 11:34 AM  Vitals shown include unvalidated device data.  Last Pain:  Vitals:   12/06/18 0736  TempSrc:   PainSc: 0-No pain      Patients Stated Pain Goal: 2 (89/78/47 8412)  Complications: No apparent anesthesia complications

## 2018-12-06 NOTE — Progress Notes (Signed)
  Echocardiogram Echocardiogram Transesophageal has been performed.  Kim [name] M 12/06/2018, 11:22 AM

## 2018-12-06 NOTE — Anesthesia Procedure Notes (Signed)
Procedure Name: Intubation Date/Time: 12/06/2018 8:38 AM Performed by: Wilburn Cornelia, CRNA Pre-anesthesia Checklist: Patient identified, Emergency Drugs available, Suction available, Patient being monitored and Timeout performed Patient Re-evaluated:Patient Re-evaluated prior to induction Oxygen Delivery Method: Circle system utilized Preoxygenation: Pre-oxygenation with 100% oxygen Induction Type: IV induction Ventilation: Mask ventilation without difficulty Laryngoscope Size: Mac and 3 Grade View: Grade I Tube type: Oral Tube size: 7.0 mm Number of attempts: 1 Airway Equipment and Method: Stylet Placement Confirmation: ETT inserted through vocal cords under direct vision,  positive ETCO2,  CO2 detector and breath sounds checked- equal and bilateral Secured at: 21 cm Tube secured with: Tape Dental Injury: Teeth and Oropharynx as per pre-operative assessment

## 2018-12-07 ENCOUNTER — Encounter (HOSPITAL_COMMUNITY): Payer: Self-pay | Admitting: Cardiovascular Disease

## 2018-12-07 ENCOUNTER — Inpatient Hospital Stay (HOSPITAL_COMMUNITY): Payer: Medicare Other

## 2018-12-07 ENCOUNTER — Other Ambulatory Visit: Payer: Self-pay | Admitting: Physician Assistant

## 2018-12-07 DIAGNOSIS — Z9889 Other specified postprocedural states: Secondary | ICD-10-CM

## 2018-12-07 DIAGNOSIS — I340 Nonrheumatic mitral (valve) insufficiency: Principal | ICD-10-CM

## 2018-12-07 DIAGNOSIS — I361 Nonrheumatic tricuspid (valve) insufficiency: Secondary | ICD-10-CM

## 2018-12-07 LAB — CBC
HCT: 33.7 % — ABNORMAL LOW (ref 36.0–46.0)
Hemoglobin: 10.6 g/dL — ABNORMAL LOW (ref 12.0–15.0)
MCH: 29.6 pg (ref 26.0–34.0)
MCHC: 31.5 g/dL (ref 30.0–36.0)
MCV: 94.1 fL (ref 80.0–100.0)
Platelets: 137 10*3/uL — ABNORMAL LOW (ref 150–400)
RBC: 3.58 MIL/uL — ABNORMAL LOW (ref 3.87–5.11)
RDW: 14.6 % (ref 11.5–15.5)
WBC: 8.2 10*3/uL (ref 4.0–10.5)
nRBC: 0 % (ref 0.0–0.2)

## 2018-12-07 LAB — ECHOCARDIOGRAM LIMITED
Height: 68 in
Weight: 2228.8 oz

## 2018-12-07 LAB — BASIC METABOLIC PANEL
Anion gap: 6 (ref 5–15)
BUN: 23 mg/dL (ref 8–23)
CO2: 23 mmol/L (ref 22–32)
Calcium: 8.3 mg/dL — ABNORMAL LOW (ref 8.9–10.3)
Chloride: 105 mmol/L (ref 98–111)
Creatinine, Ser: 1.3 mg/dL — ABNORMAL HIGH (ref 0.44–1.00)
GFR calc Af Amer: 44 mL/min — ABNORMAL LOW (ref >60)
GFR calc non Af Amer: 38 mL/min — ABNORMAL LOW (ref >60)
Glucose, Bld: 137 mg/dL — ABNORMAL HIGH (ref 70–99)
Potassium: 4.3 mmol/L (ref 3.5–5.1)
Sodium: 134 mmol/L — ABNORMAL LOW (ref 135–145)

## 2018-12-07 MED ORDER — ASPIRIN 81 MG PO CHEW: 81.0000 mg | CHEWABLE_TABLET | Freq: Every day | ORAL | 0 refills | Status: DC

## 2018-12-07 NOTE — Discharge Instructions (Signed)
Groin Site Care °Refer to this sheet in the next few weeks. These instructions provide you with information on caring for yourself after your procedure. Your caregiver may also give you more specific instructions. Your treatment has been planned according to current medical practices, but problems sometimes occur. Call your caregiver if you have any problems or questions after your procedure. °HOME CARE INSTRUCTIONS °· You may shower 24 hours after the procedure. Remove the bandage (dressing) and gently wash the site with plain soap and water. Gently pat the site dry.  °· Do not apply powder or lotion to the site.  °· Do not sit in a bathtub, swimming pool, or whirlpool for 5 to 7 days.  °· No bending, squatting, or lifting anything over 10 pounds (4.5 kg) as directed by your caregiver.  °· Inspect the site at least twice daily.  °· Do not drive home if you are discharged the same day of the procedure. Have someone else drive you.  °· You may drive 24 hours after the procedure unless otherwise instructed by your caregiver.  °What to expect: °· Any bruising will usually fade within 1 to 2 weeks.  °· Blood that collects in the tissue (hematoma) may be painful to the touch. It should usually decrease in size and tenderness within 1 to 2 weeks.  °SEEK IMMEDIATE MEDICAL CARE IF: °· You have unusual pain at the groin site or down the affected leg.  °· You have redness, warmth, swelling, or pain at the groin site.  °· You have drainage (other than a small amount of blood on the dressing).  °· You have chills.  °· You have a fever or persistent symptoms for more than 72 hours.  °· You have a fever and your symptoms suddenly get worse.  °· Your leg becomes pale, cool, tingly, or numb.  °You have heavy bleeding from the site. Hold pressure on the site. ° °If you have any questions or concerns you can call the structural heart phone during normal business hours 8am-4pm. If you have an urgent need after hours or weekends please  call [phone] to talk to the on call provider for general cardiology. If you have an emergency that requires immediate attention, please call 911.  ° °After MitraClip Checklist ° °Check  Test Description  ° Follow up appointment in 1-2 weeks  Most of our patients will see our structural heart physician assistant, Katie Trevionne Advani or your primary cardiologist within 1-2 weeks. Your incision site will be checked and you will be cleared to drive and resume all normal activities if you are doing well.    ° 1 month echo and follow up  You will have an echo to check on your heart valve clip and be seen back in the office by Katie Balian Schaller. Many times the echo is not read by your appointment time, but Katie will call you later that day or the following day to report your results.  ° Follow up with your primary cardiologist You will need to be seen by your primary cardiologist in the following 3-6 months after your 1 month appointment in the valve clinic. Often times your Plavix or Aspirin will be discontinued during this time, but this is decided on a case by case basis.   ° 1 year echo and follow up You will have another echo to check on your heart valve after 1 year and be seen back in the office by Katie Ester Mabe. This your last structural heart visit.  °   Bacterial endocarditis prophylaxis  You will have to take antibiotics for the rest of your life before all dental procedures (even teeth cleanings) to protect your heart valve. Antibiotics are also required before some surgeries. Please check with your cardiologist before scheduling any surgeries. Also, please make sure to tell us if you have a penicillin allergy as you will require an alternative antibiotic.   ° ° ° °

## 2018-12-07 NOTE — Progress Notes (Addendum)
CARDIAC REHAB PHASE I   PRE:  Rate/Rhythm: 62 SR  1 HB  BP:  Supine: 110/57  Sitting:   Standing:    SaO2: 97%RA  MODE:  Ambulation: 420 ft   POST:  Rate/Rhythm: 75 SR  BP:  Supine: 1332/85  Sitting:   Standing:    SaO2: 99%RA 0905-0952 Pt walked 420 ft on RA with hand held asst. Gait fairly steady. Tolerated well. Stated needed to strengthen her legs as she has not been able to do much walking in last few months. Discussed CRP 2 and referred to [place] CRP 2. Gave heart healthy diet for reference and modified walking instructions to get back to her walking safely. To bed after walk.   Graylon Good, RN BSN  12/07/2018 9:47 AM

## 2018-12-07 NOTE — Progress Notes (Signed)
Patient given verbal discharge instruction. Paper copy provided to patient. Husband and son at bedside during d/c. No further question. Patient belongings sent with patient. VSS stable at d/c. Patient d/c by tech via wheelchair.

## 2018-12-07 NOTE — Progress Notes (Signed)
  Echocardiogram 2D Echocardiogram has been performed.  Kim [name] G Kim [name] 12/07/2018, 1:30 PM

## 2018-12-07 NOTE — Discharge Summary (Addendum)
[place] VALVE TEAM  Discharge Summary    Patient ID: Kim [name] MRN: [id number]; DOB: 08/24/1935  Admit date: 12/06/2018 Discharge date: 12/07/2018  Primary Care Provider: Laurey Morale, MD  Primary Cardiologist:  Dr. Aundra Dubin   Discharge Diagnoses    Principal Problem:   Severe mitral regurgitation Active Problems:   Hypothyroidism   Hypertension   Breast cancer of upper-outer quadrant of left female breast [facility])   CAD (coronary artery disease)   Hyperlipidemia   HOCM (hypertrophic obstructive cardiomyopathy) (HCC)   Allergies Allergies  Allergen Reactions  . Penicillins Anaphylaxis and Swelling    FACIAL SWELLING PATIENT HAS HAD A PCN REACTION WITH IMMEDIATE RASH, FACIAL/TONGUE/THROAT SWELLING, SOB, OR LIGHTHEADEDNESS WITH HYPOTENSION:  #  #  YES  #  #  Has patient had a PCN reaction causing severe rash involving mucus membranes or skin necrosis: No Has patient had a PCN reaction that required hospitalization: No Has patient had a PCN reaction occurring within the last 10 years: No  . Propoxyphene N-Acetaminophen Swelling    tongue swelling  . Evolocumab Other (See Comments)    Myalgias, fatigue, some itching and burning on her feet,  Arthralgia (Joint Pain)  . Levaquin [Levofloxacin] Hives  . Statins Other (See Comments)    Joint pain  . Cortisone Rash  . Myrbetriq [Mirabegron] Rash    Rash on face  . Pseudoephedrine Other (See Comments)    Facial flushing    Diagnostic Studies/Procedures    12/06/2018 MITRAL VALVE REPAIR  Conclusion  Successful percutaneous mitral valve with placement of 2 MitraClip NTR devices on the medial and lateral aspects of A2/P2 scallops, with reduction in MR from 4+ to trace  Recommend: ASA 81 mg daily x 3 months. Resume Apixaban in the am.    _____________   Echo 35/20: pending  History of Present Illness     Kim [name] is a 84 y.o. female with a history of CAD s/p PCI of  RCA (2015), HOCM, HTN, HLD, hypothyroidism, persistent atrial fibrillation maintaining sinus s/p DCCV, MVP with mitral valve prolapse with primary mitral regurgitation who presented to [facility] on 12[facility]06[facility]18 for planned MitraClip.   Patient states that she has been told she had a heart murmur for most of her life.  There is no previous history of rheumatic fever or rheumatic heart disease.  She developed breast cancer in 2015 and underwent lumpectomy with radiation therapy followed by chemotherapy including Herceptin.  During breast cancer treatment she developed unstable angina.  She underwent catheterization followed by PCI and stenting of the mid right coronary artery using a drug-eluting stent in March 2015.  Echocardiogram performed at that time revealed findings consistent with hypertrophic obstructive cardiomyopathy with severe asymmetric basal septal hypertrophy, systolic anterior motion of mitral valve, and dynamic left ventricular outflow tract obstruction with peak gradient estimated 58 mmHg.  She reportedly had moderate mitral regurgitation at that time.  Follow-up echocardiogram November 2015 revealed no residual gradient across left ventricular outflow tract on beta-blocker therapy.    She recently presented with presumably new onset persistent atrial fibrillation with rapid ventricular response rate and associated with acute exacerbation of chronic diastolic congestive heart failure.  She has since then been maintaining sinus rhythm following DC cardioversion on oral amiodarone. TEE during DCCV revealed mitral valve prolapse with severe mitral regurgitation. She underwent left and right heart catheterization by Dr. Aundra Dubin which revealed multivessel coronary artery disease with continued patency of the stent  in the mid right coronary artery. She was noted to have diffuse 70 to 80% stenosis of the left anterior descending coronary artery just after takeoff of the large first diagonal branch.  She also had  60% ostial stenosis of the posterior descending coronary artery.  Right heart pressures were normal and cardiac output preserved. Medical therapy recommended.   The patient has been evaluated by the multidisciplinary valve team and felt to have severe, symptomatic mitral regurgitation and to be a suitable candidate for MitraClip, which was set up for 12/06/18.    Hospital Course     Consultants: none  She underwent successful percutaneous mitral valve with placement of 2 MitraClip NTR devices on the medial and lateral aspects of A2/P2 scallops, with reduction in MR from 4+ to trace. She was resumed on home Eliquis and started on a ASA 81 mg daily which will be continued x 3 months. She has walked with cardiac rehab today and did well. Echocardiogram completed today but pending formal read. She will be discharged home on her same medical regimen plus aspirin. I will see her back in the office next week for close follow up.    _____________  Discharge Vitals Blood pressure 121/84, pulse (!) 59, temperature 98 F (36.7 C), temperature source Oral, resp. rate (!) 22, height 5\' 8"  (1.727 m), weight 63.2 kg, last menstrual period 10/19/1991, SpO2 98 %.  Filed Weights   12/06/18 0713  Weight: 63.2 kg    Labs & Radiologic Studies    CBC Recent Labs    12/05/18 0915 12/07/18 0247  WBC 5.8 8.2  HGB 12.4 10.6*  HCT 40.6 33.7*  MCV 100.0 94.1  PLT 219 735*   Basic Metabolic Panel Recent Labs    12/05/18 0915 12/07/18 0247  NA 136 134*  K 4.0 4.3  CL 103 105  CO2 26 23  GLUCOSE 92 137*  BUN 28* 23  CREATININE 1.25* 1.30*  CALCIUM 8.8* 8.3*   Liver Function Tests Recent Labs    12/05/18 0915  AST 16  ALT 13  ALKPHOS 59  BILITOT 0.6  PROT 6.6  ALBUMIN 4.0   No results for input(s): LIPASE, AMYLASE in the last 72 hours. Cardiac Enzymes No results for input(s): CKTOTAL, CKMB, CKMBINDEX, TROPONINI in the last 72 hours. BNP Invalid input(s): POCBNP D-Dimer No results for  input(s): DDIMER in the last 72 hours. Hemoglobin A1C Recent Labs    12/05/18 0914  HGBA1C 5.3   Fasting Lipid Panel No results for input(s): CHOL, HDL, LDLCALC, TRIG, CHOLHDL, LDLDIRECT in the last 72 hours. Thyroid Function Tests No results for input(s): TSH, T4TOTAL, T3FREE, THYROIDAB in the last 72 hours.  Invalid input(s): FREET3 _____________  Dg Chest 2 View  Result Date: 12/05/2018 CLINICAL DATA:  Pre op for heart surgery on 12/06/2018, HTN, nonsmoker, pt mentioned have entire left arm/hand tingling x "this week" EXAM: CHEST - 2 VIEW COMPARISON:  10/14/2018 FINDINGS: Cardiac silhouette is mildly enlarged. No mediastinal or hilar masses. There is no evidence of adenopathy. Clear lungs.  No pleural effusion or pneumothorax. Skeletal structures are intact. IMPRESSION: 1. No acute cardiopulmonary disease. 2. Mild cardiomegaly. Electronically Signed   By: Lajean Manes M.D.   On: 12/05/2018 14:31   Disposition   Pt is being discharged home today in good condition.  Follow-up Plans & Appointments    Follow-up Information    Eileen Stanford, PA-C. Go on 12/13/2018.   Specialties:  Cardiology, Radiology Why:  @ 1pm, please arrive  10 minutes early Contact information: [address] [place] [address] [phone]          Discharge Instructions    Amb Referral to Cardiac Rehabilitation   Complete by:  As directed    Diagnosis:  Valve Repair   Valve:  Mitral Comment - mitraclip      Discharge Medications   Allergies as of 12/07/2018      Reactions   Penicillins Anaphylaxis, Swelling   FACIAL SWELLING PATIENT HAS HAD A PCN REACTION WITH IMMEDIATE RASH, FACIAL/TONGUE/THROAT SWELLING, SOB, OR LIGHTHEADEDNESS WITH HYPOTENSION:  #  #  YES  #  #  Has patient had a PCN reaction causing severe rash involving mucus membranes or skin necrosis: No Has patient had a PCN reaction that required hospitalization: No Has patient had a PCN reaction occurring within  the last 10 years: No   Propoxyphene N-acetaminophen Swelling   tongue swelling   Evolocumab Other (See Comments)   Myalgias, fatigue, some itching and burning on her feet,  Arthralgia (Joint Pain)   Levaquin [levofloxacin] Hives   Statins Other (See Comments)   Joint pain   Cortisone Rash   Myrbetriq [mirabegron] Rash   Rash on face   Pseudoephedrine Other (See Comments)   Facial flushing      Medication List    TAKE these medications   amiodarone 200 MG tablet Commonly known as:  PACERONE 400mg  (2 tabs) twice daily for 1 week. 200mg  (1 tab) twice daily for 1 week. Then 200mg  (1 tab) daily. What changed:    how much to take  how to take this  when to take this  additional instructions   apixaban 5 MG Tabs tablet Commonly known as:  ELIQUIS Take 1 tablet (5 mg total) by mouth 2 (two) times daily.   aspirin 81 MG chewable tablet Chew 1 tablet (81 mg total) by mouth daily. Start taking on:  December 08, 2018   CALCIUM-D PO Take 1 tablet by mouth daily.   diltiazem 120 MG 24 hr capsule Commonly known as:  CARDIZEM CD Take 1 capsule (120 mg total) by mouth daily.   exemestane 25 MG tablet Commonly known as:  AROMASIN Take 1 tablet (25 mg total) by mouth daily after breakfast.   ezetimibe 10 MG tablet Commonly known as:  ZETIA TAKE 1 TABLET BY MOUTH ONCE A DAY   furosemide 40 MG tablet Commonly known as:  LASIX Take 1 tablet (40 mg total) by mouth daily.   levothyroxine 137 MCG tablet Commonly known as:  SYNTHROID, LEVOTHROID Take 1 tablet (137 mcg total) by mouth daily before breakfast.   metoprolol succinate 50 MG 24 hr tablet Commonly known as:  TOPROL-XL TAKE 1 TABLET BY MOUTH TWICE (2) DAILY WITH OR IMMEDIATELY FOLLOWING A MEAL What changed:  See the new instructions.   multivitamin with minerals Tabs tablet Take 1 tablet by mouth daily.   potassium chloride 10 MEQ tablet Commonly known as:  K-DUR Take 2 tablets (20 mEq total) by mouth daily.           Outstanding Labs/Studies   none  Duration of Discharge Encounter   Greater than 30 minutes including physician time.  Signed, Angelena Form, PA-C 12/07/2018, 1:40 PM [phone]  Patient seen, examined. Available data reviewed. Agree with findings, assessment, and plan as outlined by Nell Range, PA-C.  The patient is independently interviewed and examined this morning.  She is alert, oriented, in no distress.  She is sitting up  at the bedside eager to go home.  Her breathing is fine.  She denies chest pain or shortness of breath.  Her right groin site is clear.  Heart is regular rate and rhythm with a grade 2/6 systolic murmur heard throughout the precordium.  Lungs are clear.  JVP is normal.  Extremities have no edema.  Her echocardiogram demonstrates the only trace to mild residual mitral regurgitation.  She will follow-up as planned above.  The patient will start back on apixaban today.  Sherren Mocha, M.D. 12/07/2018 9:19 PM

## 2018-12-08 ENCOUNTER — Telehealth: Payer: Self-pay | Admitting: Physician Assistant

## 2018-12-08 ENCOUNTER — Telehealth (HOSPITAL_COMMUNITY): Payer: Self-pay

## 2018-12-08 NOTE — Telephone Encounter (Signed)
  [place] VALVE TEAM   Patient contacted regarding discharge from [facility] on 12/07/2018  Patient understands to follow up with provider Nell Range on 3/11 at 1pm at [place].  Patient understands discharge instructions? yes Patient understands medications and regiment? yes Patient understands to bring all medications to this visit? yes   Angelena Form PA-C  [facility]

## 2018-12-08 NOTE — Telephone Encounter (Signed)
Pt insurance is active and benefits verified through [place] $20.00, DED 0/0 met, out of pocket $3,300/$1,004.22 met, co-insurance 0%. no pre-authorization required. [employer], 12/08/2018 @ 10:41am, REF# [id number]  Will contact patient to see if she is interested in the Cardiac Rehab Program. If interested, patient will need to complete follow up appt. Once completed, patient will be contacted for scheduling upon review by the RN Navigator. Tedra Sen[place]. Support Rep II

## 2018-12-09 NOTE — Progress Notes (Signed)
HEART AND Kim [name]                                       Cardiology Office Note    Date:  12/13/2018   ID:  ISELLA SLATTEN, DOB [birth year], MRN [id number]  PCP:  Laurey Morale, MD  Cardiologist:  Dr. Aundra Dubin  CC: TOC s/p MitraClip   History of Present Illness:  Kim [name] is a 84 y.o. female with a history of CAD s/p PCI of RCA (2015), HOCM, HTN, HLD, hypothyroidism, persistent atrial fibrillation maintaining sinus s/p DCCV, MVP with mitral valve prolapse with primary mitral regurgitation s/p MitraClip (12/06/18) who presents to clinic for follow up.    Patient states that she has been told she had a heart murmur for most of her life. There is no previous history of rheumatic fever or rheumatic heart disease. She developed breast cancer in 2015 and underwent lumpectomy with radiation therapy followed by chemotherapy including Herceptin.During breast cancer treatment she developed unstable angina. She underwent catheterization followed by PCI and stenting of the mid right coronary artery using a drug-eluting stent in March 2015. Echocardiogram performed at that time revealed findings consistent with hypertrophic obstructive cardiomyopathy with severe asymmetric basal septal hypertrophy, systolic anterior motion of mitral valve, and dynamic left ventricular outflow tract obstruction with peak gradient estimated 58 mmHg. She reportedly had moderate mitral regurgitation at that time. Follow-up echocardiogram November 2015 revealed no residual gradient across left ventricular outflow tract on beta-blocker therapy.   She recently presented with presumably new onset persistent atrial fibrillation with rapid ventricular response rate and associated with acute exacerbation of chronic diastolic congestive heart failure. She has since then been maintaining sinus rhythm following DC cardioversion on oral amiodarone. TEE during DCCV revealed mitral  valve prolapse with severe mitral regurgitation. She underwent left and right heart catheterization by Dr. Aundra Dubin which revealed multivessel coronary artery disease with continued patency of the stent in the mid right coronary artery.She was noted to have diffuse 70 to 80% stenosis of the left anterior descending coronary artery just after takeoff of the large first diagonal branch. She also had 60% ostial stenosis of the posterior descending coronary artery. Right heart pressures were normal and cardiac output preserved. Medical therapy recommended.   She underwent successful percutaneous mitral valve with placement of 2 MitraClip NTR devices on the medial and lateral aspects of A2/P2 scallops, with reduction in MR from 4+ to trace. She was resumed on home Eliquis and started on a ASA 81 mg daily which will be continued x 3 months. Post op echocardiogram showed EF 65%, no mitral regurgitation with mild MS with a mean gradient of 4 mm Hg.   Today she presents to clinic for follow up. No CP or SOB. No LE edema, orthopnea or PND. No dizziness or syncope. No blood in stool or urine. No palpitations. She feels a lot better since her clip with energy and stamina. She was concerned about a 30 lbs weight loss since the beginning of the year. However, we went through her documented weights from office visits over the past year and it has been relatively stable over the past 6 months. She also admits to dietary changes that may have contributed.    Past Medical History:  Diagnosis Date  . Allergic rhinitis   . Allergy   . Arthritis  hands  . Breast cancer of upper-outer quadrant of left female breast ([place]) 11/08/13   finished chemo and radiation 2015  . CAD (coronary artery disease)    a. 12/2013 Cath/PCI: LM nl, LAD 20p, 55m, 30d, D1 30p, LCX 20p, 5m, OM1 20, Mo2 40p, RCA 30p, 20m (4.0x16 Promus Premier DES), 20d, RPL 30, RPDA 70p, 74m, EF 65-70%.  . Diverticulosis   . HOH (hard of hearing)     bilateral, wears hearing aids  . Hx of adenomatous colonic polyps 02/1999  . Hyperlipidemia   . Hypertension   . Hypertrophic obstructive cardiomyopathy (HOCM) [facility])    Cardiologist is Dr. [name]  . Hypothyroidism   . Iron deficiency anemia due to chronic blood loss 01/07/2016  . Low back pain    gets ESI from Dr. Rennis Harding  . Malabsorption of iron 01/07/2016  . Myocardial infarction ([place]) 12/2013   very mild-with first chemo -placed stent x1  . Persistent atrial fibrillation with rapid ventricular response 10/14/2018  . Personal history of radiation therapy 2015  . S/P radiation therapy 05/14/2014-06/26/2014   1) Left Breast  / 50 Gy in 25 fractions, 2) Left Supraclavicular fossa/ 47.5 Gy in 25 fractions, 3) Left Posterior Axillary boost / 4.825 Gy in 25 fractions, 4) Left Breast boost / 10 Gy in 5 fractions   . Tubulovillous adenoma of colon 12/2009   with HGD  . Wears hearing aid    left and right     Past Surgical History:  Procedure Laterality Date  . ABDOMINAL HYSTERECTOMY    . ANTERIOR AND POSTERIOR REPAIR N/A 01/17/2015   Procedure: CYSTOCELE REPAIR ;  Surgeon: Princess Bruins, MD;  Location: [place] [facility];  Service: Gynecology;  Laterality: N/A;  . BILATERAL SALPINGOOPHORECTOMY     see Laurin Coder NP for GYN exams  . BREAST BIOPSY Left 10/18/2013  . BREAST BIOPSY Left 10/31/2013  . BREAST LUMPECTOMY Left 11/05/2013  . BREAST LUMPECTOMY WITH NEEDLE LOCALIZATION AND AXILLARY LYMPH NODE DISSECTION Left 11/05/2013   Procedure: LEFT BREAST LUMPECTOMY WITH NEEDLE LOCALIZATION and axillary lymph Node Dissection;  Surgeon: Edward Jolly, MD;  Location: [place];  Service: General;  Laterality: Left;  . BREAST SURGERY  11/2013   left lumpectomy  . CARDIOVERSION N/A 10/17/2018   Procedure: CARDIOVERSION;  Surgeon: Larey Dresser, MD;  Location: [facility] ENDOSCOPY;  Service: Cardiovascular;  Laterality: N/A;  . CARDIOVERSION N/A 11/08/2018   Procedure: CARDIOVERSION;  Surgeon:  Larey Dresser, MD;  Location: [place];  Service: Cardiovascular;  Laterality: N/A;  . CATARACT EXTRACTION W/ INTRAOCULAR LENS  IMPLANT, BILATERAL  2012   bilateral  . COLONOSCOPY  11/04/2015   per Dr. Fuller Plan, adenomatous polyps, no repeats planned   . EYE SURGERY  11/22/2006   cataracts, bilateral, intraocular lens implant  . JOINT REPLACEMENT     2019  . LEFT HEART CATHETERIZATION WITH CORONARY ANGIOGRAM N/A 12/19/2013   Procedure: LEFT HEART CATHETERIZATION WITH CORONARY ANGIOGRAM;  Surgeon: Burnell Blanks, MD;  Location: [facility] CATH LAB;  Service: Cardiovascular;  Laterality: N/A;  . lumbar epidural steroid injection     lumber spinal stenosis  . LUMBAR LAMINECTOMY/DECOMPRESSION MICRODISCECTOMY Right 03/01/2018   Procedure: RIGHT LUMBAR FOUR- LUMBAR FIVE LAMINECTOMY/MICRODISCECTOMY;  Surgeon: [name], MD;  Location: [place];  Service: Neurosurgery;  Laterality: Right;  . MITRAL VALVE REPAIR N/A 12/06/2018   Procedure: MITRAL VALVE REPAIR;  Surgeon: Sherren Mocha, MD;  Location: [place] CV LAB;  Service: Cardiovascular;  Laterality: N/A;  .  POLYPECTOMY    . PORTACATH PLACEMENT Right 12/11/2013   Procedure: INSERTION PORT-A-CATH;  Surgeon: Edward Jolly, MD;  Location: [place];  Service: General;  Laterality: Right;  Subclavian Vein;   . RIGHT/LEFT HEART CATH AND CORONARY ANGIOGRAPHY N/A 11/03/2018   Procedure: RIGHT/LEFT HEART CATH AND CORONARY ANGIOGRAPHY;  Surgeon: Larey Dresser, MD;  Location: [place] CV LAB;  Service: Cardiovascular;  Laterality: N/A;  . TEE WITHOUT CARDIOVERSION N/A 10/17/2018   Procedure: TRANSESOPHAGEAL ECHOCARDIOGRAM (TEE);  Surgeon: Larey Dresser, MD;  Location: [facility] ENDOSCOPY;  Service: Cardiovascular;  Laterality: N/A;  . TEE WITHOUT CARDIOVERSION N/A 11/08/2018   Procedure: TRANSESOPHAGEAL ECHOCARDIOGRAM (TEE);  Surgeon: Larey Dresser, MD;  Location: [facility] ENDOSCOPY;  Service: Cardiovascular;  Laterality: N/A;  .  TOTAL KNEE ARTHROPLASTY Right 10/24/2017   Procedure: TOTAL RIGHT KNEE ARTHROPLASTY;  Surgeon: Gaynelle Arabian, MD;  Location: [facility];  Service: Orthopedics;  Laterality: Right;    Current Medications: Outpatient Medications Prior to Visit  Medication Sig Dispense Refill  . amiodarone (PACERONE) 200 MG tablet 400mg  (2 tabs) twice daily for 1 week. 200mg  (1 tab) twice daily for 1 week. Then 200mg  (1 tab) daily. 90 tablet 3  . apixaban (ELIQUIS) 5 MG TABS tablet Take 1 tablet (5 mg total) by mouth 2 (two) times daily. 60 tablet 3  . aspirin 81 MG chewable tablet Chew 1 tablet (81 mg total) by mouth daily. 90 tablet 0  . Calcium Carbonate-Vitamin D (CALCIUM-D PO) Take 1 tablet by mouth daily.    Marland Kitchen diltiazem (CARDIZEM CD) 120 MG 24 hr capsule Take 1 capsule (120 mg total) by mouth daily. 30 capsule 5  . exemestane (AROMASIN) 25 MG tablet Take 1 tablet (25 mg total) by mouth daily after breakfast. 90 tablet 3  . ezetimibe (ZETIA) 10 MG tablet TAKE 1 TABLET BY MOUTH ONCE A DAY 30 tablet 1  . furosemide (LASIX) 40 MG tablet Take 1 tablet (40 mg total) by mouth daily. 30 tablet 5  . levothyroxine (SYNTHROID, LEVOTHROID) 137 MCG tablet Take 1 tablet (137 mcg total) by mouth daily before breakfast. 90 tablet 3  . metoprolol succinate (TOPROL-XL) 50 MG 24 hr tablet TAKE 1 TABLET BY MOUTH TWICE (2) DAILY WITH OR IMMEDIATELY FOLLOWING A MEAL 60 tablet 3  . Multiple Vitamin (MULTIVITAMIN WITH MINERALS) TABS tablet Take 1 tablet by mouth daily.    . potassium chloride (K-DUR) 10 MEQ tablet Take 2 tablets (20 mEq total) by mouth daily. 60 tablet 5   No facility-administered medications prior to visit.      Allergies:   Penicillins; Propoxyphene n-acetaminophen; Evolocumab; Levaquin [levofloxacin]; Statins; Cortisone; Myrbetriq [mirabegron]; and Pseudoephedrine   Social History   Socioeconomic History  . Marital status: Married    Spouse name: Not on file  . Number of children: 2  . Years of education:  Not on file  . Highest education level: Not on file  Occupational History  . Occupation: Retired-school system  Social Needs  . Financial resource strain: Not on file  . Food insecurity:    Worry: Not on file    Inability: Not on file  . Transportation needs:    Medical: Not on file    Non-medical: Not on file  Tobacco Use  . Smoking status: Never Smoker  . Smokeless tobacco: Never Used  . Tobacco comment: never used tobacco  Substance and Sexual Activity  . Alcohol use: No    Alcohol/week: 0.0 standard drinks  . Drug use:  No  . Sexual activity: Not Currently    Birth control/protection: Post-menopausal  Lifestyle  . Physical activity:    Days per week: Not on file    Minutes per session: Not on file  . Stress: Not on file  Relationships  . Social connections:    Talks on phone: Not on file    Gets together: Not on file    Attends religious service: Not on file    Active member of club or organization: Not on file    Attends meetings of clubs or organizations: Not on file    Relationship status: Not on file  Other Topics Concern  . Not on file  Social History Narrative   Lives at home with husband.       Family History:  The patient's family history includes Alcohol abuse in her father; Kidney failure in her mother.      ROS:   Please see the history of present illness.    ROS All other systems reviewed and are negative.   PHYSICAL EXAM:   VS:  BP 118/62   Pulse 65   Ht 5\' 8"  (1.727 m)   Wt 138 lb 12.8 oz (63 kg)   LMP 10/19/1991   SpO2 97%   BMI 21.10 kg/m    GEN: Well nourished, well developed, in no acute distress HEENT: normal Neck: no JVD or masses Cardiac: RRR; + 2/6 systolic murmur.  No rubs, or gallops,no edema  Respiratory:  clear to auscultation bilaterally, normal work of breathing GI: soft, nontender, nondistended, + BS MS: no deformity or atrophy Skin: warm and dry, no rash. Groin site healing well  Neuro:  Alert and Oriented x 3,  Strength and sensation are intact Psych: euthymic mood, full affect    Wt Readings from Last 3 Encounters:  12/13/18 138 lb 12.8 oz (63 kg)  12/06/18 139 lb 4.8 oz (63.2 kg)  12/05/18 139 lb 4.8 oz (63.2 kg)      Studies/Labs Reviewed:   EKG:  EKG is NOT ordered today.    Recent Labs: 10/16/2018: Magnesium 2.1 11/20/2018: TSH 1.24 12/05/2018: ALT 13; B Natriuretic Peptide 365.4 12/07/2018: BUN 23; Creatinine, Ser 1.30; Hemoglobin 10.6; Platelets 137; Potassium 4.3; Sodium 134   Lipid Panel    Component Value Date/Time   CHOL 134 10/15/2018 0355   TRIG 56 10/15/2018 0355   HDL 28 (L) 10/15/2018 0355   CHOLHDL 4.8 10/15/2018 0355   VLDL 11 10/15/2018 0355   LDLCALC 95 10/15/2018 0355   LDLDIRECT 121.2 07/24/2014 0900    Additional studies/ records that were reviewed today include:  12/06/2018 MITRAL VALVE REPAIR  Conclusion  Successful percutaneous mitral valve with placement of 2 MitraClip NTR devices on the medial and lateral aspects of A2/P2 scallops, with reduction in MR from 4+ to trace  Recommend: ASA 81 mg daily x 3 months. Resume Apixaban in the am.    _____________   Echo 12/07/18 IMPRESSIONS  1. The left ventricle has hyperdynamic systolic function, with an ejection fraction of >65%. There is mildly increased left ventricular wall thickness.  2. Left atrial size was massively dilated.  3. The aortic valve is tricuspid. Mild sclerosis of the aortic valve. Mild aortic annular calcification noted.  4. The tricuspid valve was normal in structure.  5. The pulmonic valve was normal in structure.  6. - MitraClip: There is a mitraclip present that appears stable in position. There is no trivial mitral regurgitation by colorflow doppler. There is very mild  mitral stenosis with MV Mean grad: 4.0 mmHg and MV Area (PHT): 1.74 cm.  ASSESSMENT & PLAN:   Severe MR s/p MitraClip: doing quite well. Groin site is healing well. She has noticed an improvement in how she feels  since having MitraClip. SBE prophylaxis discussed; her dentist RX's clindamycin due to a PCN allergy. She will continue on aspirin and Eliquis. I will see her back 4/15 for 1 month follow up and echo.    Medication Adjustments/Labs and Tests Ordered: Current medicines are reviewed at length with the patient today.  Concerns regarding medicines are outlined above.  Medication changes, Labs and Tests ordered today are listed in the Patient Instructions below. Patient Instructions  Medication Instructions:  Your provider discussed the importance of taking an antibiotic prior to all dental visits to prevent damage to the heart valves from infection. You were given a prescription for CLINDAMYCIN 600 mg to take one hour prior to any dental appointment.   Labwork:   Testing/Procedures:   Follow-Up: Please keep your appointments for your echocardiogram and subsequent office visit with Nell Range, PA on January 17, 2019. Please arrive by 11:00AM.    Signed, Angelena Form, PA-C  12/13/2018 1:25 PM    [facility] [address], [place], [place]  [address] Phone: [phone]; Fax: [phone]

## 2018-12-12 ENCOUNTER — Encounter (HOSPITAL_COMMUNITY): Payer: Self-pay | Admitting: Cardiovascular Disease

## 2018-12-13 ENCOUNTER — Encounter: Payer: Self-pay | Admitting: Physician Assistant

## 2018-12-13 ENCOUNTER — Other Ambulatory Visit: Payer: Self-pay

## 2018-12-13 ENCOUNTER — Ambulatory Visit: Payer: Medicare Other | Admitting: Physician Assistant

## 2018-12-13 VITALS — BP 118/62 | HR 65 | Ht 68.0 in | Wt 138.8 lb

## 2018-12-13 DIAGNOSIS — Z9889 Other specified postprocedural states: Secondary | ICD-10-CM | POA: Diagnosis not present

## 2018-12-13 NOTE — Patient Instructions (Addendum)
Medication Instructions:  Clindamycin 600mg  has been added to your medication list. Please take 1 hour prior to all dental visits.  Follow-Up: Please keep your appointments for your echocardiogram and subsequent office visit with Nell Range, PA on January 17, 2019. Please arrive by 11:00AM.

## 2018-12-20 NOTE — Telephone Encounter (Signed)
Attempted to call patient in regards to Cardiac Rehab - to let pt know we are closed at this time due to the COVID-19 and will contact once we have resume scheduling.  °LMTCB °

## 2018-12-20 NOTE — Telephone Encounter (Signed)
Pt insurance is active and benefits verified through [facility] ([facility] Medicare. Co-pay $20.00, DED $0.00/$0.00 met, out of pocket $3,300.00/$1,004.22 met, co-insurance 0%. No pre-authorization required. Passport, 12/08/2018 @ 10:41AM, REF# [id number]

## 2018-12-27 ENCOUNTER — Encounter (HOSPITAL_COMMUNITY): Payer: Medicare Other

## 2019-01-04 ENCOUNTER — Telehealth: Payer: Self-pay | Admitting: Cardiovascular Disease

## 2019-01-04 NOTE — Telephone Encounter (Signed)
Patient called and discussed cancelling elective echocardiogram due to concerns for Covid19 and need for social distancing . Patient stable with no urgent symptoms.  TTE scheduled for 4/15 can be rescheduled for 3-6 months   Katie I would call her and see if you can do your appointment for that day as a virtual visit

## 2019-01-08 ENCOUNTER — Encounter: Payer: Self-pay | Admitting: *Deleted

## 2019-01-08 ENCOUNTER — Encounter: Payer: Self-pay | Admitting: Thoracic Surgery (Cardiothoracic Vascular Surgery)

## 2019-01-10 ENCOUNTER — Other Ambulatory Visit (HOSPITAL_COMMUNITY): Payer: Self-pay | Admitting: Cardiology

## 2019-01-11 ENCOUNTER — Telehealth: Payer: Self-pay

## 2019-01-11 NOTE — Telephone Encounter (Signed)
Called Pt to switch o/v to Virtual visit. She stated that she could do a virtual visit if we could call her cell phone (maybe use Doximity) She does have a BP monitor but no scale to check her weight. She will have her vitals and current med list available for her visit on 01/17/2019.     Virtual Visit Pre-Appointment Phone Call  Steps For Call:  1. Confirm consent - "In the setting of the current Covid19 crisis, you are scheduled for a (phone or video) visit with your provider on (date) at (time).  Just as we do with many in-office visits, in order for you to participate in this visit, we must obtain consent.  If you'd like, I can send this to your mychart (if signed up) or email for you to review.  Otherwise, I can obtain your verbal consent now.  All virtual visits are billed to your insurance company just like a normal visit would be.  By agreeing to a virtual visit, we'd like you to understand that the technology does not allow for your provider to perform an examination, and thus may limit your provider's ability to fully assess your condition.  Finally, though the technology is pretty good, we cannot assure that it will always work on either your or our end, and in the setting of a video visit, we may have to convert it to a phone-only visit.  In either situation, we cannot ensure that we have a secure connection.  Are you willing to proceed?"  2. Give patient instructions for WebEx download to smartphone as below if video visit  3. Advise patient to be prepared with any vital sign or heart rhythm information, their current medicines, and a piece of paper and pen handy for any instructions they may receive the day of their visit  4. Inform patient they will receive a phone call 15 minutes prior to their appointment time (may be from unknown caller ID) so they should be prepared to answer  5. Confirm that appointment type is correct in Epic appointment notes (video vs telephone)     TELEPHONE CALL NOTE  Kim [name] has been deemed a candidate for a follow-up tele-health visit to limit community exposure during the Covid-19 pandemic. I spoke with the patient via phone to ensure availability of phone/video source, confirm preferred email & phone number, and discuss instructions and expectations.  I reminded Kim [name] to be prepared with any vital sign and/or heart rhythm information that could potentially be obtained via home monitoring, at the time of her visit. I reminded Kim [name] to expect a phone call at the time of her visit if her visit.  Did the patient verbally acknowledge consent to treatment? Yes  Kim [name], [place] 01/11/2019 4:30 PM   DOWNLOADING THE [place], go to [employer] and type in WebEx in the search bar. [place] [employer], the blue/green circle. The app is free but as with any other app downloads, their phone may require them to verify saved payment information or Apple password. The patient does NOT have to create an account.  - If Android, ask patient to go to [employer] and type in WebEx in the search bar. [place] [employer], the blue/green circle. The app is free but as with any other app downloads, their phone may require them to verify saved payment information or Android password. The patient does NOT have to  create an account.   CONSENT FOR TELE-HEALTH VISIT - PLEASE REVIEW  I hereby voluntarily request, consent and authorize [facility] and its employed or contracted physicians, physician assistants, nurse practitioners or other licensed health care professionals (the Practitioner), to provide me with telemedicine health care services (the "Services") as deemed necessary by the treating Practitioner. I acknowledge and consent to receive the Services by the Practitioner via telemedicine. I understand that the telemedicine visit will involve communicating with the  Practitioner through live audiovisual communication technology and the disclosure of certain medical information by electronic transmission. I acknowledge that I have been given the opportunity to request an in-person assessment or other available alternative prior to the telemedicine visit and am voluntarily participating in the telemedicine visit.  I understand that I have the right to withhold or withdraw my consent to the use of telemedicine in the course of my care at any time, without affecting my right to future care or treatment, and that the Practitioner or I may terminate the telemedicine visit at any time. I understand that I have the right to inspect all information obtained and/or recorded in the course of the telemedicine visit and may receive copies of available information for a reasonable fee.  I understand that some of the potential risks of receiving the Services via telemedicine include:  Marland Kitchen Delay or interruption in medical evaluation due to technological equipment failure or disruption; . Information transmitted may not be sufficient (e.g. poor resolution of images) to allow for appropriate medical decision making by the Practitioner; and/or  . In rare instances, security protocols could fail, causing a breach of personal health information.  Furthermore, I acknowledge that it is my responsibility to provide information about my medical history, conditions and care that is complete and accurate to the best of my ability. I acknowledge that Practitioner's advice, recommendations, and/or decision may be based on factors not within their control, such as incomplete or inaccurate data provided by me or distortions of diagnostic images or specimens that may result from electronic transmissions. I understand that the practice of medicine is not an exact science and that Practitioner makes no warranties or guarantees regarding treatment outcomes. I acknowledge that I will receive a copy of this  consent concurrently upon execution via email to the email address I last provided but may also request a printed copy by calling the office of [place].    I understand that my insurance will be billed for this visit.   I have read or had this consent read to me. . I understand the contents of this consent, which adequately explains the benefits and risks of the Services being provided via telemedicine.  . I have been provided ample opportunity to ask questions regarding this consent and the Services and have had my questions answered to my satisfaction. . I give my informed consent for the services to be provided through the use of telemedicine in my medical care  By participating in this telemedicine visit I agree to the above.

## 2019-01-15 ENCOUNTER — Telehealth (HOSPITAL_COMMUNITY): Payer: Self-pay | Admitting: Cardiology

## 2019-01-15 MED ORDER — POTASSIUM CHLORIDE ER 10 MEQ PO TBCR: 20.0000 meq | EXTENDED_RELEASE_TABLET | ORAL | 5 refills | Status: DC | PRN

## 2019-01-15 MED ORDER — FUROSEMIDE 40 MG PO TABS: 40.0000 mg | ORAL_TABLET | ORAL | 5 refills | Status: DC | PRN

## 2019-01-15 NOTE — Telephone Encounter (Signed)
   Taking all medications.   Complaining of dizziness every now and then but happening more often. Weight at home 140 . Says she has lost 38 pounds over the last 4 months. I suspect she may be dry.   Change lasix to as needed for weight 144 pounds or greater and she can stop stop potassium and only take if you take lasix.   Mrs Kim [name] verbalized understanding and appreciated the phone call.   I will also forward Nell Range PA-C with structural heart team. .   Darrick Grinder NP-C  12:59 PM

## 2019-01-15 NOTE — Telephone Encounter (Signed)
Thanks! I have a virtual visit with her this week so I will check in.   KT

## 2019-01-15 NOTE — Telephone Encounter (Signed)
Patient called to report increase in dizziness. No b/p readings available, reports weight is stable. Denies increased SOB or CP  Would like to know if she can discontinue any of her cardiac medications, as something is making her terribly dizzy and constipated.

## 2019-01-16 ENCOUNTER — Telehealth (INDEPENDENT_AMBULATORY_CARE_PROVIDER_SITE_OTHER): Payer: Medicare Other | Admitting: Physician Assistant

## 2019-01-16 ENCOUNTER — Other Ambulatory Visit: Payer: Self-pay | Admitting: Physician Assistant

## 2019-01-16 ENCOUNTER — Other Ambulatory Visit: Payer: Self-pay

## 2019-01-16 DIAGNOSIS — Z9889 Other specified postprocedural states: Secondary | ICD-10-CM

## 2019-01-16 DIAGNOSIS — Z7189 Other specified counseling: Secondary | ICD-10-CM

## 2019-01-16 NOTE — Progress Notes (Signed)
Great thanks Katie 

## 2019-01-16 NOTE — Patient Instructions (Signed)
Hello Mrs Nouri,   It was so nice to talk to you on the phone today. I am so glad you are doing so well. I just wanted to send you a recap of our discussion.   Please continue taking antibiotics prior to any dental work including cleanings (clindamycin). You can discontinue aspirin after 3 months of therapy (on 03/08/2019).  Your 1 month echo has been rescheduled to 6/30.  Please continue regular follow up with Dr. Aundra Dubin.   I will see you back in 1 years time with an echo.   All appointment details are attached to this letter in your "after visit summary."  Please call us with any questions or concerns you may have and please stay safe during these uncertain times.  Nell Range

## 2019-01-16 NOTE — Progress Notes (Signed)
[facility]   MULTIDISCIPLINARY HEART VALVE TEAM   Evaluation Performed:  Follow-up visit  This visit type was conducted due to national recommendations for restrictions regarding the COVID-19 Pandemic (e.g. social distancing).  This format is felt to be most appropriate for this patient at this time.  All issues noted in this document were discussed and addressed.  No physical exam was performed (except for noted visual exam findings with Telehealth visits).  The patient has consented to conduct a Telehealth visit and understands insurance will be billed. Please see consent on telephone note from 01/11/19.  Date:  01/16/2019   ID:  Kim [name], DOB November 17, 1934, MRN [id number]  Patient Location:  [address] [place] [address]   Provider location:   [place], [place] [address]  PCP:  Laurey Morale, MD  Cardiologist:  Dr. Aundra Dubin Electrophysiologist:  None   Chief Complaint: 1 month s/p MitraClip   History of Present Illness:    Kim [name] is a 84 y.o. female with a history of CAD s/p PCI of RCA (2015), HOCM, HTN, HLD, hypothyroidism, persistent atrial fibrillation maintaining sinus s/p DCCV, MVP with mitral valve prolapse with primary mitral regurgitation s/p MitraClip (12/06/18) who presents via audio conferencing for a telehealth visit today.   The patient does not symptoms concerning for COVID-19 infection (fever, chills, cough, or new SHORTNESS OF BREATH).   Patient states that she has been told she had a heart murmur for most of her life. There is no previous history of rheumatic fever or rheumatic heart disease. She developed breast cancer in 2015 and underwent lumpectomy with radiation therapy followed by chemotherapy including Herceptin.During breast cancer treatment she developed unstable angina. She underwent catheterization followed by PCI and stenting of the mid right coronary artery using a drug-eluting stent in March 2015.  Echocardiogram performed at that time revealed findings consistent with hypertrophic obstructive cardiomyopathy with severe asymmetric basal septal hypertrophy, systolic anterior motion of mitral valve, and dynamic left ventricular outflow tract obstruction with peak gradient estimated 58 mmHg. She reportedly had moderate mitral regurgitation at that time. Follow-up echocardiogram November 2015 revealed no residual gradient across left ventricular outflow tract on beta-blocker therapy.  She recently presented with presumably new onset persistent atrial fibrillation with rapid ventricular response rate and associated with acute exacerbation of chronic diastolic congestive heart failure. She has since then been maintaining sinus rhythm following DC cardioversion on oral amiodarone.TEEduring DCCVrevealed mitral valve prolapse with severe mitral regurgitation. She underwent left and right heart catheterization by Dr. Aundra Dubin which revealed multivessel coronary artery disease with continued patency of the stent in the mid right coronary artery.She was noted to have diffuse 70 to 80% stenosis of the left anterior descending coronary artery just after takeoff of the large first diagonal branch. She also had 60% ostial stenosis of the posterior descending coronary artery. Right heart pressures were normal and cardiac output preserved.Medical therapy recommended.  She underwent successful percutaneous mitral valve with placement of 2 MitraClip NTR devices on the medial and lateral aspects of A2/P2 scallops, with reduction in MR from 4+ to trace. She was resumed on home Eliquis and started on aASA 81 mg dailywhich will be continuedx 3 months. Post op echocardiogram showed EF 65%, no mitral regurgitation with mild MS with a mean gradient of 4 mm Hg.   She called the advanced CHF office earlier this week with a 38 lbs weight loss and some intermittent dizziness. Lasix/ KDur was changed  to PRN for weight  over 144lbs.   Today she presents for a telehealth visit by phone. She is initially reluctant to talk because she had just spoke with Darrick Grinder NP and didn't feel she needed further follow up. She is already feeling better with medication adjustment. No more dizziness. No CP or SOB. No LE edema, orthopnea or PND. No syncope. No blood in stool or urine. No palpitations. KCCQ completed over phone. She has had a big improvement in her symptoms since MitraCLip and overall very pleased with her progress.   Prior CV studies:   The following studies were reviewed today:  12/06/2018 MITRAL VALVE REPAIR  Conclusion  Successful percutaneous mitral valve with placement of 2 MitraClip NTR devices on the medial and lateral aspects of A2/P2 scallops, with reduction in MR from 4+ to trace  Recommend: ASA 81 mg daily x 3 months. Resume Apixaban in the am.   _____________  Echo 12/07/18 IMPRESSIONS 1. The left ventricle has hyperdynamic systolic function, with an ejection fraction of >65%. There is mildly increased left ventricular wall thickness. 2. Left atrial size was massively dilated. 3. The aortic valve is tricuspid. Mild sclerosis of the aortic valve. Mild aortic annular calcification noted. 4. The tricuspid valve was normal in structure. 5. The pulmonic valve was normal in structure. 6. - MitraClip: There is a mitraclip present that appears stable in position. There is no trivial mitral regurgitation by colorflow doppler. There is very mild mitral stenosis with MV Mean grad: 4.0 mmHg and MV Area (PHT): 1.74 cm.   Past Medical History:  Diagnosis Date  . Allergic rhinitis   . Allergy   . Arthritis    hands  . Breast cancer of upper-outer quadrant of left female breast ([place]) 11/08/13   finished chemo and radiation 2015  . CAD (coronary artery disease)    a. 12/2013 Cath/PCI: LM nl, LAD 20p, 48m, 30d, D1 30p, LCX 20p, 54m, OM1 20, Mo2 40p, RCA 30p, 8m (4.0x16 Promus Premier DES), 20d, RPL  30, RPDA 70p, 59m, EF 65-70%.  . Diverticulosis   . HOH (hard of hearing)    bilateral, wears hearing aids  . Hx of adenomatous colonic polyps 02/1999  . Hyperlipidemia   . Hypertension   . Hypertrophic obstructive cardiomyopathy (HOCM) [facility])    Cardiologist is Dr. [name]  . Hypothyroidism   . Iron deficiency anemia due to chronic blood loss 01/07/2016  . Low back pain    gets ESI from Dr. Rennis Harding  . Malabsorption of iron 01/07/2016  . Myocardial infarction ([place]) 12/2013   very mild-with first chemo -placed stent x1  . Persistent atrial fibrillation with rapid ventricular response 10/14/2018  . Personal history of radiation therapy 2015  . S/P radiation therapy 05/14/2014-06/26/2014   1) Left Breast  / 50 Gy in 25 fractions, 2) Left Supraclavicular fossa/ 47.5 Gy in 25 fractions, 3) Left Posterior Axillary boost / 4.825 Gy in 25 fractions, 4) Left Breast boost / 10 Gy in 5 fractions   . Tubulovillous adenoma of colon 12/2009   with HGD  . Wears hearing aid    left and right    Past Surgical History:  Procedure Laterality Date  . ABDOMINAL HYSTERECTOMY    . ANTERIOR AND POSTERIOR REPAIR N/A 01/17/2015   Procedure: CYSTOCELE REPAIR ;  Surgeon: Princess Bruins, MD;  Location: [facility];  Service: Gynecology;  Laterality: N/A;  . BILATERAL SALPINGOOPHORECTOMY     see Laurin Coder NP for GYN exams  .  BREAST BIOPSY Left 10/18/2013  . BREAST BIOPSY Left 10/31/2013  . BREAST LUMPECTOMY Left 11/05/2013  . BREAST LUMPECTOMY WITH NEEDLE LOCALIZATION AND AXILLARY LYMPH NODE DISSECTION Left 11/05/2013   Procedure: LEFT BREAST LUMPECTOMY WITH NEEDLE LOCALIZATION and axillary lymph Node Dissection;  Surgeon: Edward Jolly, MD;  Location: [facility];  Service: General;  Laterality: Left;  . BREAST SURGERY  11/2013   left lumpectomy  . CARDIOVERSION N/A 10/17/2018   Procedure: CARDIOVERSION;  Surgeon: Larey Dresser, MD;  Location: [facility] ENDOSCOPY;  Service: Cardiovascular;   Laterality: N/A;  . CARDIOVERSION N/A 11/08/2018   Procedure: CARDIOVERSION;  Surgeon: Larey Dresser, MD;  Location: [facility];  Service: Cardiovascular;  Laterality: N/A;  . CATARACT EXTRACTION W/ INTRAOCULAR LENS  IMPLANT, BILATERAL  2012   bilateral  . COLONOSCOPY  11/04/2015   per Dr. Fuller Plan, adenomatous polyps, no repeats planned   . EYE SURGERY  11/22/2006   cataracts, bilateral, intraocular lens implant  . JOINT REPLACEMENT     2019  . LEFT HEART CATHETERIZATION WITH CORONARY ANGIOGRAM N/A 12/19/2013   Procedure: LEFT HEART CATHETERIZATION WITH CORONARY ANGIOGRAM;  Surgeon: Burnell Blanks, MD;  Location: [facility] CATH LAB;  Service: Cardiovascular;  Laterality: N/A;  . lumbar epidural steroid injection     lumber spinal stenosis  . LUMBAR LAMINECTOMY/DECOMPRESSION MICRODISCECTOMY Right 03/01/2018   Procedure: RIGHT LUMBAR FOUR- LUMBAR FIVE LAMINECTOMY/MICRODISCECTOMY;  Surgeon: [name], MD;  Location: [facility];  Service: Neurosurgery;  Laterality: Right;  . MITRAL VALVE REPAIR N/A 12/06/2018   Procedure: MITRAL VALVE REPAIR;  Surgeon: Sherren Mocha, MD;  Location: [facility] CV LAB;  Service: Cardiovascular;  Laterality: N/A;  . POLYPECTOMY    . PORTACATH PLACEMENT Right 12/11/2013   Procedure: INSERTION PORT-A-CATH;  Surgeon: Edward Jolly, MD;  Location: [facility];  Service: General;  Laterality: Right;  Subclavian Vein;   . RIGHT/LEFT HEART CATH AND CORONARY ANGIOGRAPHY N/A 11/03/2018   Procedure: RIGHT/LEFT HEART CATH AND CORONARY ANGIOGRAPHY;  Surgeon: Larey Dresser, MD;  Location: [facility] CV LAB;  Service: Cardiovascular;  Laterality: N/A;  . TEE WITHOUT CARDIOVERSION N/A 10/17/2018   Procedure: TRANSESOPHAGEAL ECHOCARDIOGRAM (TEE);  Surgeon: Larey Dresser, MD;  Location: [facility] ENDOSCOPY;  Service: Cardiovascular;  Laterality: N/A;  . TEE WITHOUT CARDIOVERSION N/A 11/08/2018   Procedure: TRANSESOPHAGEAL ECHOCARDIOGRAM (TEE);  Surgeon: Larey Dresser, MD;  Location: [facility] ENDOSCOPY;  Service: Cardiovascular;  Laterality: N/A;  . TOTAL KNEE ARTHROPLASTY Right 10/24/2017   Procedure: TOTAL RIGHT KNEE ARTHROPLASTY;  Surgeon: Gaynelle Arabian, MD;  Location: [facility];  Service: Orthopedics;  Laterality: Right;     Current Meds  Medication Sig  . amiodarone (PACERONE) 200 MG tablet 400mg  (2 tabs) twice daily for 1 week. 200mg  (1 tab) twice daily for 1 week. Then 200mg  (1 tab) daily. (Patient taking differently: Take 200 mg by mouth daily. )  . apixaban (ELIQUIS) 5 MG TABS tablet Take 1 tablet (5 mg total) by mouth 2 (two) times daily.  Marland Kitchen aspirin 81 MG chewable tablet Chew 1 tablet (81 mg total) by mouth daily.  . Calcium Carbonate-Vitamin D (CALCIUM-D PO) Take 1 tablet by mouth daily.  . clindamycin (CLEOCIN) 300 MG capsule Take 600 mg by mouth as directed. Take 1 hour prior to dental visits.  Marland Kitchen diltiazem (CARDIZEM CD) 120 MG 24 hr capsule Take 1 capsule (120 mg total) by mouth daily.  Marland Kitchen exemestane (AROMASIN) 25 MG tablet Take 1 tablet (25 mg total) by mouth  daily after breakfast.  . ezetimibe (ZETIA) 10 MG tablet TAKE 1 TABLET BY MOUTH ONCE DAILY  . furosemide (LASIX) 40 MG tablet Take 1 tablet (40 mg total) by mouth as needed. For weight 144 or greater  . levothyroxine (SYNTHROID, LEVOTHROID) 137 MCG tablet Take 1 tablet (137 mcg total) by mouth daily before breakfast.  . metoprolol succinate (TOPROL-XL) 50 MG 24 hr tablet TAKE 1 TABLET BY MOUTH TWICE (2) DAILY WITH OR IMMEDIATELY FOLLOWING A MEAL  . Multiple Vitamin (MULTIVITAMIN WITH MINERALS) TABS tablet Take 1 tablet by mouth daily.  . potassium chloride (K-DUR) 10 MEQ tablet Take 2 tablets (20 mEq total) by mouth as needed. Only take 20 meq if you take lasix  . [DISCONTINUED] ferrous sulfate 325 (65 FE) MG tablet Take 325 mg by mouth 2 (two) times daily.    . [DISCONTINUED] pravastatin (PRAVACHOL) 80 MG tablet Take 1 tablet (80 mg total) by mouth every evening.     Allergies:    Penicillins; Propoxyphene n-acetaminophen; Evolocumab; Levaquin [levofloxacin]; Statins; Cortisone; Myrbetriq [mirabegron]; and Pseudoephedrine   Social History   Tobacco Use  . Smoking status: Never Smoker  . Smokeless tobacco: Never Used  . Tobacco comment: never used tobacco  Substance Use Topics  . Alcohol use: No    Alcohol/week: 0.0 standard drinks  . Drug use: No     Family Hx: The patient's family history includes Alcohol abuse in her father; Kidney failure in her mother. There is no history of Colon cancer, Rectal cancer, Stomach cancer, or Colon polyps.  ROS:   Please see the history of present illness.    All other systems reviewed and are negative.   Labs/Other Tests and Data Reviewed:    Recent Labs: 10/16/2018: Magnesium 2.1 11/20/2018: TSH 1.24 12/05/2018: ALT 13; B Natriuretic Peptide 365.4 12/07/2018: BUN 23; Creatinine, Ser 1.30; Hemoglobin 10.6; Platelets 137; Potassium 4.3; Sodium 134   Recent Lipid Panel Lab Results  Component Value Date/Time   CHOL 134 10/15/2018 03:55 AM   TRIG 56 10/15/2018 03:55 AM   HDL 28 (L) 10/15/2018 03:55 AM   CHOLHDL 4.8 10/15/2018 03:55 AM   LDLCALC 95 10/15/2018 03:55 AM   LDLDIRECT 121.2 07/24/2014 09:00 AM    Wt Readings from Last 3 Encounters:  12/13/18 138 lb 12.8 oz (63 kg)  12/06/18 139 lb 4.8 oz (63.2 kg)  12/05/18 139 lb 4.8 oz (63.2 kg)     Exam:    There were no vitals filed for this visit.  Not completed as visit conducted over the phone  ASSESSMENT & PLAN:    Severe MR s/p MitraClip: she is feeling much better with no shortness of breath since her MitraClip surgery. She has NYHA class I symptoms. Echo has been pushed out given covid 19 pandemic. KCCQ completed over the phone. She has clindamycin for SBE prophylaxis. She will continue on Aspirin and Eliquis x 3 months and then discontinued Aspirin. I will see her in 1 year with an echo and 6 minute walk test.    COVID-19 Education: The signs and  symptoms of COVID-19 were discussed with the patient and how to seek care for testing (follow up with PCP or arrange E-visit).  The importance of social distancing was discussed today.  Patient Risk:   After full review of this patients clinical status, I feel that they are at least moderate risk at this time.  Time:   Today, I have spent 15 minutes with the patient with telehealth technology discussing post  surgical recovery, symptoms and instructions going forward.     Medication Adjustments/Labs and Tests Ordered: Current medicines are reviewed at length with the patient today.  Concerns regarding medicines are outlined above.  Tests Ordered: No orders of the defined types were placed in this encounter.  Medication Changes: No orders of the defined types were placed in this encounter.   Disposition:  in 1 year(s)  Signed, Angelena Form, PA-C  01/16/2019 2:49 PM    [facility] [address], [place], [place]  [address] Phone: [phone]; Fax: [phone]

## 2019-01-17 ENCOUNTER — Telehealth: Payer: Medicare Other | Admitting: Physician Assistant

## 2019-01-17 ENCOUNTER — Other Ambulatory Visit (HOSPITAL_COMMUNITY): Payer: Medicare Other

## 2019-01-26 ENCOUNTER — Ambulatory Visit
Admission: RE | Admit: 2019-01-26 | Discharge: 2019-01-26 | Disposition: A | Discharge status: Home / Self Care | Payer: Medicare Other | Source: Ambulatory Visit | Attending: Family Medicine | Admitting: Family Medicine

## 2019-01-26 ENCOUNTER — Other Ambulatory Visit: Payer: Self-pay

## 2019-01-26 DIAGNOSIS — Z853 Personal history of malignant neoplasm of breast: Secondary | ICD-10-CM

## 2019-01-30 ENCOUNTER — Telehealth (HOSPITAL_COMMUNITY): Payer: Self-pay | Admitting: *Deleted

## 2019-02-16 NOTE — Progress Notes (Signed)
Patient ID: Kim [name], female   DOB: [birth year], 84 y.o.   MRN: [id number] PCP: Dr. Sarajane Jews Oncologist: Dr. Marin Olp Cardiology: Dr. Aundra Dubin  84 y.o. with history of CAD, HCM, paroxysmal atrial fibrillation, and mitral regurgitation presents for followup of HCM, CHF, MR.  Patient had breast cancer in 2015 and received treatment involving Herceptin.  During breast cancer treatment, she developed unstable angina and ended up getting a DES to the mid RCA in 3/15.  Patient additionally has a history of HOCM.  This has been recognized on prior echoes.  She has severe asymmetric basal septal hypertrophy and SAM with LVOT gradient peak 58 mmHg on echo in 3/15 along with moderate MR.  Repeat echo in 7/15 showed LVOT gradient down to 30 mmHg on higher beta blocker.  Repeat echo in 11/15 showed no significant LVOT gradient but SAM still present.  Echo (8/16) showed asymmetric septal hypertrophy, No SAM, no significant LVOT gradient.  Echo 4/18 showed EF 55-60%, small LVOT gradient with severe asymmetric septal hypertrophy, mild MR, PASP 32 mmHg.    She was admitted in 1/20 with symptomatic atrial fibrillation with RVR, this was a new diagnosis.  She was started on Eliquis and cardioverted after TEE back to NSR.  TEE showed severe mitral regurgitation that appeared to be due mostly to posterior leaflet prolapse, there was minimal systolic anterior motion.   S/p Mitra Clip 12/07/18. Post op echocardiogram showed EF 65%, no mitral regurgitation with mild MS with a mean gradient of 4 mm Hg.  She returns today for regular follow up. Last seen in February and was doing okay. She is now s/p MitraClip 12/07/18. Lasix changed to PRN last month due to 38 lb weight loss. She has continued to take lasix daily. Overall doing fine. She gets dizzy with standing after taking her lasix in the morning. Denies SOB on flat ground. Has not been out too much. No edema, orthopnea, or PND. No CP. No cough, fever, or chills. No bleeding on  Eliquis. Weights stable at home, 140 lbs. Taking all medications. No food insecurity  ReDS: 16%.   Labs (3/15): K 4.5, creatinine 0.84, LDL 91, HDL 46 Labs (5/15): K 3.9, creatinine 1.1 Labs (12/15): LDL 80, HDL 28, hemoglobin 10.9 Labs (1/16): K 3.7, creatinine 0.8 Labs (2/16): K 3.6, creatinine 1.2, HCT 34.4 Labs (6/16): K 4.1, creatinine 1.24 Labs (7/17): K 4.1, creatinine 1.2, HCT 38.8, LDL 143 Labs (1/18): LDL 142 Labs (5/18): K 4.4, creatinine 1.0 Labs (1/20): K 3.9, creatinine 1.27  PMH: 1. CAD: Unstable angina 3/15 with LHC showing 99% mRCA stenosis, treated with DES to mRCA.  2. Hypothyroidism 3. Diverticulosis 4. HTN 5. H/o TAH/BSO 6. Breast cancer: s/p lumpectomy with lymph node biopsy in 2/15.  4/10 nodes positive.  She was started on docetaxol/carboplatin/Herceptin in 3/15 with plan for 6 cycles chemo.  7. Hypertrophic obstructive cardiomyopathy: Echo (3/15) with severe focal basal septal hypertrophy (22 mm), narrow LV outflow tract with mitral valve SAM and 58 mmHg peak LVOT resting gradient, EF 60-65%, moderate MR, moderate LAE, normal RV, lateral s' 10.4 cm/sec.  Patient says that her 2 grown sons has had echoes to screen for HOCM.  Echo (4/15) with EF 65-70%, severe focal basal septal hypertrophy, LVOT gradient 33 mmHg, SAM was present with moderate MR, normal RV size and systolic function, lateral S' 10.6, GLS -17.5%.  Cardiac MRI (5/15) with EF 65%, moderate asymmetric septal hypertrophy, systolic anterior motion of the mitral valve with moderate MR, there  was no delayed enhancement.  Echo (7/15) with EF 65-70%, moderate ASH, peak LVOT gradient 30 mmHg, SAM with moderate MR, moderate to severe LAE.  Echo (11/15) with EF 55-60%, GLS -15%, no significant LVOT gradient, systolic anterior motion of the mitral valve with mild MR, normal RV size and systolic function. Echo (4/14) with EF 55-60%, severe asymmetric septal hypertrophy, LVOT gradient 20 mmHg, mild-moderate MR, GLS  -18%.  - Echo (8/16): EF 60-65%, asymmetric septal hypertrophy, no significant LV outflow tract gradient, mild MR.   - Echo (4/18): EF 55-60%, small LVOT gradient with severe asymmetric septal hypertrophy, mild MR, PASP 32 mmHg.   - TEE (1/20): EF 55-60%, moderate-severe asymmetric septal hypertrophy, no LVOT gradient, normal RV size and systolic function, severe LAE, severe MR with posterior MV leaflet prolapse and minimal SAM.   8. Hyperlipidemia: Myalgias with atorvastatin, myalgias with Repatha.  9. Atrial fibrillation: Paroxysmal, first noted in 1/20.  10. Mitral regurgitation: Appears severe on 1/20 TEE.  Has posterior leaflet prolapse with minimal SAM.   SH: Married, 2 children, retired, nonsmoker.   FH: No family history of HOCM or sudden death.  There is a family history of CAD.   Review of systems complete and found to be negative unless listed in HPI.   Current Outpatient Medications  Medication Sig Dispense Refill  . amiodarone (PACERONE) 200 MG tablet 400mg  (2 tabs) twice daily for 1 week. 200mg  (1 tab) twice daily for 1 week. Then 200mg  (1 tab) daily. (Patient taking differently: Take 200 mg by mouth daily. ) 90 tablet 3  . apixaban (ELIQUIS) 5 MG TABS tablet Take 1 tablet (5 mg total) by mouth 2 (two) times daily. 60 tablet 3  . aspirin 81 MG chewable tablet Chew 1 tablet (81 mg total) by mouth daily. 90 tablet 0  . Calcium Carbonate-Vitamin D (CALCIUM-D PO) Take 1 tablet by mouth daily.    . clindamycin (CLEOCIN) 300 MG capsule Take 600 mg by mouth as directed. Take 1 hour prior to dental visits.    Marland Kitchen diltiazem (CARDIZEM CD) 120 MG 24 hr capsule Take 1 capsule (120 mg total) by mouth daily. 30 capsule 5  . exemestane (AROMASIN) 25 MG tablet Take 1 tablet (25 mg total) by mouth daily after breakfast. 90 tablet 3  . ezetimibe (ZETIA) 10 MG tablet TAKE 1 TABLET BY MOUTH ONCE DAILY 90 tablet 0  . furosemide (LASIX) 40 MG tablet Take 1 tablet (40 mg total) by mouth as needed. For  weight 144 or greater 30 tablet 5  . levothyroxine (SYNTHROID, LEVOTHROID) 137 MCG tablet Take 1 tablet (137 mcg total) by mouth daily before breakfast. 90 tablet 3  . metoprolol succinate (TOPROL-XL) 50 MG 24 hr tablet TAKE 1 TABLET BY MOUTH TWICE (2) DAILY WITH OR IMMEDIATELY FOLLOWING A MEAL 60 tablet 3  . Multiple Vitamin (MULTIVITAMIN WITH MINERALS) TABS tablet Take 1 tablet by mouth daily.    . potassium chloride (K-DUR) 10 MEQ tablet Take 2 tablets (20 mEq total) by mouth as needed. Only take 20 meq if you take lasix 60 tablet 5   No current facility-administered medications for this encounter.    Vitals:   02/19/19 0857  BP: 120/78  Pulse: 76  SpO2: 97%  Weight: 66 kg (145 lb 6.4 oz)    Wt Readings from Last 3 Encounters:  02/19/19 66 kg (145 lb 6.4 oz)  12/13/18 63 kg (138 lb 12.8 oz)  12/06/18 63.2 kg (139 lb 4.8 oz)  General: Elderly. No resp difficulty. HEENT: Normal. HOH Neck: Supple. No JVD. Carotids 2+ bilat; no bruits. No thyromegaly or nodule noted. Cor: PMI nondisplaced. RRR, 2/6 HSM apex Lungs: CTAB, normal effort. Abdomen: Soft, non-tender, non-distended, no HSM. No bruits or masses. +BS  Extremities: No cyanosis, clubbing, or rash. R and LLE no edema.  Neuro: Alert & orientedx3, cranial nerves grossly intact. moves all 4 extremities w/o difficulty. Affect pleasant  EKG: Sinus brady 58 bpm with 1st degree AV block. Personally reviewed.   ReDS: 16%.  Assessment/Plan: 1. CAD: Status post PCI for unstable angina in 3/15 with DES to [facility]. She is now off Plavix.  ASA stopped when Eliquis begun.  - No s/s ischemia.  - No ASA, taking Eliquis.  No bleeding.  - Unable to tolerate statins or Repatha, she is on Zetia.   2. Hyperlipidemia: Myalgias with Crestor, Lipitor, and pravastatin.  Myalgias with Repatha.  She has tolerated Zetia 10 mg daily. LDL 95 in 1/20, not at goal but not terrible. No change.   3. Hypertrophic obstructive cardiomyopathy: No family  history of HOCM or sudden death (interestingly, her husband has HOCM). Cardiac MRI in 5/15 showed no delayed enhancement.  TEE in 4/18 showed severe asymmetric septal hypertrophy but no LVOT gradient.  There was minimal SAM.   - Continue current Toprol XL and diltiazem CD. Stable. - Per patient, her 2 sons have had screening echoes to look for hypertrophic cardiomyopathy and did not show signs of HOCM.    4. Atrial fibrillation: Noted initially in 1/20, suspect this was triggered by mitral regurgitation and HCM.  - Continue amiodarone 200 mg daily. TSH okay 11/2018. LFTs okay 12/2018.  - Continue Toprol XL and diltiazem CD for rate control.  - EKG today shows sinus brady.  5. Chronic diastolic CHF: Suspect this is triggered by atrial fibrillation and mitral regurgitation. - Volume status dry. ReDS is 16%. Improved NYHA II, but confounded by deconditioning.  - Change lasix and potassium to PRN only. We discussed when she should take this.   6. Mitral regurgitation: Suspect that mitral regurgitation may be the trigger for atrial fibrillation. Suspect the mechanism of MR is most likely mitral valve prolapse. There appeared to be only minimal SAM on TEE. The left atrium is severely enlarged.  - S/p MitraClip 12/06/18 - Post op echocardiogram showed EF 65%, no mitral regurgitation with mild MS with a mean gradient of 4 mm Hg.   Check CBC and BMET today.  Change lasix and potassium to PRN only. Follow up in 4 months with Dr Matilde Bash, NP  02/19/19   Greater than 50% of the 25 minute visit was spent in counseling/coordination of care regarding disease state education, salt/fluid restriction, sliding scale diuretics, and medication compliance.

## 2019-02-19 ENCOUNTER — Encounter (HOSPITAL_COMMUNITY): Payer: Self-pay

## 2019-02-19 ENCOUNTER — Other Ambulatory Visit: Payer: Self-pay

## 2019-02-19 ENCOUNTER — Ambulatory Visit (HOSPITAL_COMMUNITY)
Admission: RE | Admit: 2019-02-19 | Discharge: 2019-02-19 | Disposition: A | Discharge status: Home / Self Care | Payer: Medicare Other | Source: Ambulatory Visit | Attending: Cardiology | Admitting: Cardiology

## 2019-02-19 VITALS — BP 120/78 | HR 76 | Wt 145.4 lb

## 2019-02-19 DIAGNOSIS — E785 Hyperlipidemia, unspecified: Secondary | ICD-10-CM | POA: Diagnosis not present

## 2019-02-19 DIAGNOSIS — Z8249 Family history of ischemic heart disease and other diseases of the circulatory system: Secondary | ICD-10-CM | POA: Diagnosis not present

## 2019-02-19 DIAGNOSIS — I5032 Chronic diastolic (congestive) heart failure: Secondary | ICD-10-CM | POA: Insufficient documentation

## 2019-02-19 DIAGNOSIS — Z9889 Other specified postprocedural states: Secondary | ICD-10-CM | POA: Diagnosis not present

## 2019-02-19 DIAGNOSIS — I110 Hypertensive heart disease with heart failure: Secondary | ICD-10-CM | POA: Insufficient documentation

## 2019-02-19 DIAGNOSIS — Z853 Personal history of malignant neoplasm of breast: Secondary | ICD-10-CM | POA: Diagnosis not present

## 2019-02-19 DIAGNOSIS — Z7901 Long term (current) use of anticoagulants: Secondary | ICD-10-CM | POA: Insufficient documentation

## 2019-02-19 DIAGNOSIS — I340 Nonrheumatic mitral (valve) insufficiency: Secondary | ICD-10-CM | POA: Diagnosis not present

## 2019-02-19 DIAGNOSIS — I421 Obstructive hypertrophic cardiomyopathy: Secondary | ICD-10-CM | POA: Insufficient documentation

## 2019-02-19 DIAGNOSIS — Z7982 Long term (current) use of aspirin: Secondary | ICD-10-CM | POA: Diagnosis not present

## 2019-02-19 DIAGNOSIS — Z955 Presence of coronary angioplasty implant and graft: Secondary | ICD-10-CM | POA: Diagnosis not present

## 2019-02-19 DIAGNOSIS — E039 Hypothyroidism, unspecified: Secondary | ICD-10-CM | POA: Diagnosis not present

## 2019-02-19 DIAGNOSIS — I480 Paroxysmal atrial fibrillation: Secondary | ICD-10-CM | POA: Insufficient documentation

## 2019-02-19 DIAGNOSIS — Z79890 Hormone replacement therapy (postmenopausal): Secondary | ICD-10-CM | POA: Diagnosis not present

## 2019-02-19 DIAGNOSIS — I2510 Atherosclerotic heart disease of native coronary artery without angina pectoris: Secondary | ICD-10-CM

## 2019-02-19 DIAGNOSIS — Z79899 Other long term (current) drug therapy: Secondary | ICD-10-CM | POA: Insufficient documentation

## 2019-02-19 LAB — BASIC METABOLIC PANEL
Anion gap: 10 (ref 5–15)
BUN: 27 mg/dL — ABNORMAL HIGH (ref 8–23)
CO2: 24 mmol/L (ref 22–32)
Calcium: 9 mg/dL (ref 8.9–10.3)
Chloride: 104 mmol/L (ref 98–111)
Creatinine, Ser: 1.32 mg/dL — ABNORMAL HIGH (ref 0.44–1.00)
GFR calc Af Amer: 43 mL/min — ABNORMAL LOW (ref >60)
GFR calc non Af Amer: 37 mL/min — ABNORMAL LOW (ref >60)
Glucose, Bld: 107 mg/dL — ABNORMAL HIGH (ref 70–99)
Potassium: 3.9 mmol/L (ref 3.5–5.1)
Sodium: 138 mmol/L (ref 135–145)

## 2019-02-19 LAB — CBC
HCT: 38.3 % (ref 36.0–46.0)
Hemoglobin: 12.2 g/dL (ref 12.0–15.0)
MCH: 31 pg (ref 26.0–34.0)
MCHC: 31.9 g/dL (ref 30.0–36.0)
MCV: 97.2 fL (ref 80.0–100.0)
Platelets: 164 10*3/uL (ref 150–400)
RBC: 3.94 MIL/uL (ref 3.87–5.11)
RDW: 13.2 % (ref 11.5–15.5)
WBC: 5.3 10*3/uL (ref 4.0–10.5)
nRBC: 0 % (ref 0.0–0.2)

## 2019-02-19 MED ORDER — AMIODARONE HCL 200 MG PO TABS: 200.0000 mg | ORAL_TABLET | Freq: Every day | ORAL | 3 refills | Status: DC

## 2019-02-19 MED ORDER — FUROSEMIDE 40 MG PO TABS: 40.0000 mg | ORAL_TABLET | ORAL | 5 refills | Status: DC | PRN

## 2019-02-19 NOTE — Progress Notes (Signed)
ReDS Vest - 02/19/19 0900      ReDS Vest   MR   No    Fitting Posture  Sitting    Height Marker  Tall   station c   Ruler Value  28    Center Strip  Aligned    ReDS Value  16

## 2019-02-19 NOTE — Patient Instructions (Addendum)
CHANGE Lasix to 40 mg, one tab AS NEEDED for weight gain (weight greater than 144)  CHANGE Potassium to 20 meQ one tab AS NEEDED with every dose of lasix   Labs today We will only contact you if something comes back abnormal or we need to make some changes. Otherwise no news is good news!   Your physician recommends that you schedule a follow-up appointment in: 4 months with Dr Aundra Dubin  Do the following things EVERYDAY: 1) Weigh yourself in the morning before breakfast. Write it down and keep it in a log. 2) Take your medicines as prescribed 3) Eat low salt foods-Limit salt (sodium) to 2000 mg per day.  4) Stay as active as you can everyday 5) Limit all fluids for the day to less than 2 liters

## 2019-03-13 ENCOUNTER — Other Ambulatory Visit (HOSPITAL_COMMUNITY): Payer: Self-pay | Admitting: Cardiology

## 2019-03-20 ENCOUNTER — Telehealth (HOSPITAL_COMMUNITY): Payer: Self-pay | Admitting: *Deleted

## 2019-03-28 ENCOUNTER — Encounter: Payer: Self-pay | Admitting: Thoracic Surgery (Cardiothoracic Vascular Surgery)

## 2019-04-02 ENCOUNTER — Telehealth (HOSPITAL_COMMUNITY): Payer: Self-pay

## 2019-04-02 NOTE — Telephone Encounter (Signed)
LMTCB COVID prescreening for echo. Did not leave echo appt details per DPR.

## 2019-04-03 ENCOUNTER — Other Ambulatory Visit: Payer: Self-pay

## 2019-04-03 ENCOUNTER — Encounter (INDEPENDENT_AMBULATORY_CARE_PROVIDER_SITE_OTHER): Payer: Self-pay

## 2019-04-03 ENCOUNTER — Ambulatory Visit (HOSPITAL_COMMUNITY): Payer: Medicare Other | Attending: Cardiology

## 2019-04-03 DIAGNOSIS — Z952 Presence of prosthetic heart valve: Secondary | ICD-10-CM

## 2019-04-03 DIAGNOSIS — Z9889 Other specified postprocedural states: Secondary | ICD-10-CM | POA: Diagnosis not present

## 2019-04-03 DIAGNOSIS — I421 Obstructive hypertrophic cardiomyopathy: Secondary | ICD-10-CM | POA: Insufficient documentation

## 2019-04-03 DIAGNOSIS — I081 Rheumatic disorders of both mitral and tricuspid valves: Secondary | ICD-10-CM | POA: Insufficient documentation

## 2019-04-14 ENCOUNTER — Other Ambulatory Visit: Payer: Self-pay | Admitting: Hematology & Oncology

## 2019-04-14 DIAGNOSIS — Z170 Estrogen receptor positive status [ER+]: Secondary | ICD-10-CM

## 2019-04-14 DIAGNOSIS — C50412 Malignant neoplasm of upper-outer quadrant of left female breast: Secondary | ICD-10-CM

## 2019-05-07 ENCOUNTER — Other Ambulatory Visit: Payer: Self-pay | Admitting: Family

## 2019-05-08 ENCOUNTER — Other Ambulatory Visit (HOSPITAL_COMMUNITY): Payer: Self-pay | Admitting: Cardiology

## 2019-05-08 ENCOUNTER — Other Ambulatory Visit (HOSPITAL_COMMUNITY): Payer: Self-pay | Admitting: Student

## 2019-05-15 ENCOUNTER — Other Ambulatory Visit (HOSPITAL_COMMUNITY): Payer: Self-pay | Admitting: Student

## 2019-05-18 ENCOUNTER — Inpatient Hospital Stay: Payer: Medicare Other

## 2019-05-18 ENCOUNTER — Other Ambulatory Visit: Payer: Self-pay

## 2019-05-18 ENCOUNTER — Encounter: Payer: Self-pay | Admitting: Hematology & Oncology

## 2019-05-18 ENCOUNTER — Telehealth: Payer: Self-pay | Admitting: Hematology & Oncology

## 2019-05-18 ENCOUNTER — Inpatient Hospital Stay: Payer: Medicare Other | Attending: Hematology & Oncology | Admitting: Hematology & Oncology

## 2019-05-18 VITALS — BP 110/66 | HR 57 | Temp 97.3°F | Resp 16 | Wt 141.0 lb

## 2019-05-18 DIAGNOSIS — R350 Frequency of micturition: Secondary | ICD-10-CM | POA: Insufficient documentation

## 2019-05-18 DIAGNOSIS — I4891 Unspecified atrial fibrillation: Secondary | ICD-10-CM | POA: Diagnosis not present

## 2019-05-18 DIAGNOSIS — D0510 Intraductal carcinoma in situ of unspecified breast: Secondary | ICD-10-CM | POA: Diagnosis not present

## 2019-05-18 DIAGNOSIS — Z79811 Long term (current) use of aromatase inhibitors: Secondary | ICD-10-CM | POA: Diagnosis not present

## 2019-05-18 DIAGNOSIS — M818 Other osteoporosis without current pathological fracture: Secondary | ICD-10-CM | POA: Diagnosis not present

## 2019-05-18 DIAGNOSIS — C50012 Malignant neoplasm of nipple and areola, left female breast: Secondary | ICD-10-CM

## 2019-05-18 DIAGNOSIS — Z170 Estrogen receptor positive status [ER+]: Secondary | ICD-10-CM | POA: Insufficient documentation

## 2019-05-18 DIAGNOSIS — C50412 Malignant neoplasm of upper-outer quadrant of left female breast: Secondary | ICD-10-CM | POA: Insufficient documentation

## 2019-05-18 DIAGNOSIS — Z7901 Long term (current) use of anticoagulants: Secondary | ICD-10-CM | POA: Insufficient documentation

## 2019-05-18 DIAGNOSIS — D500 Iron deficiency anemia secondary to blood loss (chronic): Secondary | ICD-10-CM | POA: Diagnosis not present

## 2019-05-18 DIAGNOSIS — Z79899 Other long term (current) drug therapy: Secondary | ICD-10-CM | POA: Insufficient documentation

## 2019-05-18 LAB — URINALYSIS, COMPLETE (UACMP) WITH MICROSCOPIC
Bilirubin Urine: NEGATIVE
Glucose, UA: NEGATIVE mg/dL
Hgb urine dipstick: NEGATIVE
Ketones, ur: NEGATIVE mg/dL
Nitrite: NEGATIVE
Protein, ur: NEGATIVE mg/dL
RBC / HPF: NONE SEEN RBC/hpf (ref 0–5)
Specific Gravity, Urine: 1.02 (ref 1.005–1.030)
pH: 5 (ref 5.0–8.0)

## 2019-05-18 LAB — CMP (CANCER CENTER ONLY)
ALT: 8 U/L (ref 0–44)
AST: 12 U/L — ABNORMAL LOW (ref 15–41)
Albumin: 4.3 g/dL (ref 3.5–5.0)
Alkaline Phosphatase: 41 U/L (ref 38–126)
Anion gap: 10 (ref 5–15)
BUN: 44 mg/dL — ABNORMAL HIGH (ref 8–23)
CO2: 29 mmol/L (ref 22–32)
Calcium: 8.4 mg/dL — ABNORMAL LOW (ref 8.9–10.3)
Chloride: 99 mmol/L (ref 98–111)
Creatinine: 2.06 mg/dL — ABNORMAL HIGH (ref 0.44–1.00)
GFR, Est AFR Am: 25 mL/min — ABNORMAL LOW (ref >60)
GFR, Estimated: 22 mL/min — ABNORMAL LOW (ref >60)
Glucose, Bld: 93 mg/dL (ref 70–99)
Potassium: 3.8 mmol/L (ref 3.5–5.1)
Sodium: 138 mmol/L (ref 135–145)
Total Bilirubin: 0.7 mg/dL (ref 0.3–1.2)
Total Protein: 6.3 g/dL — ABNORMAL LOW (ref 6.5–8.1)

## 2019-05-18 LAB — CBC WITH DIFFERENTIAL (CANCER CENTER ONLY)
Abs Immature Granulocytes: 0.01 10*3/uL (ref 0.00–0.07)
Basophils Absolute: 0 10*3/uL (ref 0.0–0.1)
Basophils Relative: 1 %
Eosinophils Absolute: 0.1 10*3/uL (ref 0.0–0.5)
Eosinophils Relative: 3 %
HCT: 38.2 % (ref 36.0–46.0)
Hemoglobin: 12.5 g/dL (ref 12.0–15.0)
Immature Granulocytes: 0 %
Lymphocytes Relative: 19 %
Lymphs Abs: 0.9 10*3/uL (ref 0.7–4.0)
MCH: 32 pg (ref 26.0–34.0)
MCHC: 32.7 g/dL (ref 30.0–36.0)
MCV: 97.7 fL (ref 80.0–100.0)
Monocytes Absolute: 0.5 10*3/uL (ref 0.1–1.0)
Monocytes Relative: 11 %
Neutro Abs: 3.1 10*3/uL (ref 1.7–7.7)
Neutrophils Relative %: 66 %
Platelet Count: 153 10*3/uL (ref 150–400)
RBC: 3.91 MIL/uL (ref 3.87–5.11)
RDW: 13.5 % (ref 11.5–15.5)
WBC Count: 4.7 10*3/uL (ref 4.0–10.5)
nRBC: 0 % (ref 0.0–0.2)

## 2019-05-18 MED ORDER — SULFAMETHOXAZOLE-TRIMETHOPRIM 800-160 MG PO TABS: 1.0000 | ORAL_TABLET | Freq: Two times a day (BID) | ORAL | 0 refills | Status: DC

## 2019-05-18 MED ORDER — DENOSUMAB 60 MG/ML ~~LOC~~ SOSY
PREFILLED_SYRINGE | SUBCUTANEOUS | Status: AC
  Filled 2019-05-18: qty 1

## 2019-05-18 MED ORDER — DENOSUMAB 60 MG/ML ~~LOC~~ SOSY
60.0000 mg | PREFILLED_SYRINGE | Freq: Once | SUBCUTANEOUS | Status: AC
  Administered 2019-05-18: 60 mg via SUBCUTANEOUS

## 2019-05-18 NOTE — Progress Notes (Signed)
Ok to give with Calcium 8.4 per Dr. Marin Olp

## 2019-05-18 NOTE — Telephone Encounter (Signed)
Spoke with pt's granddaughter (who accompanied pt to office visit today & is still with [facility] patient) to report that pt does have a UTI. Script to be sent to [facility] per request. dph

## 2019-05-18 NOTE — Patient Instructions (Signed)
Denosumab injection What is this medicine? DENOSUMAB (den oh sue mab) slows bone breakdown. Prolia is used to treat osteoporosis in women after menopause and in men, and in people who are taking corticosteroids for 6 months or more. Xgeva is used to treat a high calcium level due to cancer and to prevent bone fractures and other bone problems caused by multiple myeloma or cancer bone metastases. Xgeva is also used to treat giant cell tumor of the bone. This medicine may be used for other purposes; ask your health care provider or pharmacist if you have questions. COMMON BRAND NAME(S): Prolia, XGEVA What should I tell my health care provider before I take this medicine? They need to know if you have any of these conditions:  dental disease  having surgery or tooth extraction  infection  kidney disease  low levels of calcium or Vitamin D in the blood  malnutrition  on hemodialysis  skin conditions or sensitivity  thyroid or parathyroid disease  an unusual reaction to denosumab, other medicines, foods, dyes, or preservatives  pregnant or trying to get pregnant  breast-feeding How should I use this medicine? This medicine is for injection under the skin. It is given by a health care professional in a hospital or clinic setting. A special MedGuide will be given to you before each treatment. Be sure to read this information carefully each time. For Prolia, talk to your pediatrician regarding the use of this medicine in children. Special care may be needed. For Xgeva, talk to your pediatrician regarding the use of this medicine in children. While this drug may be prescribed for children as young as 13 years for selected conditions, precautions do apply. Overdosage: If you think you have taken too much of this medicine contact a poison control center or emergency room at once. NOTE: This medicine is only for you. Do not share this medicine with others. What if I miss a dose? It is  important not to miss your dose. Call your doctor or health care professional if you are unable to keep an appointment. What may interact with this medicine? Do not take this medicine with any of the following medications:  other medicines containing denosumab This medicine may also interact with the following medications:  medicines that lower your chance of fighting infection  steroid medicines like prednisone or cortisone This list may not describe all possible interactions. Give your health care provider a list of all the medicines, herbs, non-prescription drugs, or dietary supplements you use. Also tell them if you smoke, drink alcohol, or use illegal drugs. Some items may interact with your medicine. What should I watch for while using this medicine? Visit your doctor or health care professional for regular checks on your progress. Your doctor or health care professional may order blood tests and other tests to see how you are doing. Call your doctor or health care professional for advice if you get a fever, chills or sore throat, or other symptoms of a cold or flu. Do not treat yourself. This drug may decrease your body's ability to fight infection. Try to avoid being around people who are sick. You should make sure you get enough calcium and vitamin D while you are taking this medicine, unless your doctor tells you not to. Discuss the foods you eat and the vitamins you take with your health care professional. See your dentist regularly. Brush and floss your teeth as directed. Before you have any dental work done, tell your dentist you are   receiving this medicine. Do not become pregnant while taking this medicine or for 5 months after stopping it. Talk with your doctor or health care professional about your birth control options while taking this medicine. Women should inform their doctor if they wish to become pregnant or think they might be pregnant. There is a potential for serious side  effects to an unborn child. Talk to your health care professional or pharmacist for more information. What side effects may I notice from receiving this medicine? Side effects that you should report to your doctor or health care professional as soon as possible:  allergic reactions like skin rash, itching or hives, swelling of the face, lips, or tongue  bone pain  breathing problems  dizziness  jaw pain, especially after dental work  redness, blistering, peeling of the skin  signs and symptoms of infection like fever or chills; cough; sore throat; pain or trouble passing urine  signs of low calcium like fast heartbeat, muscle cramps or muscle pain; pain, tingling, numbness in the hands or feet; seizures  unusual bleeding or bruising  unusually weak or tired Side effects that usually do not require medical attention (report to your doctor or health care professional if they continue or are bothersome):  constipation  diarrhea  headache  joint pain  loss of appetite  muscle pain  runny nose  tiredness  upset stomach This list may not describe all possible side effects. Call your doctor for medical advice about side effects. You may report side effects to FDA at 1-800-FDA-1088. Where should I keep my medicine? This medicine is only given in a clinic, doctor's office, or other health care setting and will not be stored at home. NOTE: This sheet is a summary. It may not cover all possible information. If you have questions about this medicine, talk to your doctor, pharmacist, or health care provider.  2020 Elsevier/Gold Standard (2018-01-27 16:10:44)

## 2019-05-18 NOTE — Telephone Encounter (Signed)
Appointments scheduled calendar printed per 8/14 los °

## 2019-05-18 NOTE — Progress Notes (Signed)
Hematology and Oncology Follow Up Visit  Kim [name] [id number] January 13, 1935 84 y.o. 05/18/2019   Principle Diagnosis:  Stage IIA (T1N1M0) adenocarcinoma of the left breast-triple positive Iron deficiency anemia secondary to blood loss  Current Therapy:   Status post cycle 4 of Abraxane/Herceptin Maintenance Herceptin q 3wk - finish 03/25/2015 Prolia 60 mg sq every 6 months - due in Jan 2021 Aromasin 25 mg by mouth daily  - start on 01/10/2017  Feraheme as needed-dose given today     Interim History:  [name] is back for followup.  She comes in with 1 of her granddaughters.  Unfortunately, her husband is having a lot of problems right now.  Kim [name] is also having some issues.  She still has problems with her back.  She had back surgery a while back.  She is having problems after that.  She has atrial fibrillation.  She is on anticoagulation for this.  She really hates taking a lot of medications.  She has had no cough.  She is having some urinary frequency.  She says she goes to the bathroom every hour or so at nighttime.  We better check her urine make sure there is no infection.  She has had no issues with fever.  There is no bleeding.  Her renal function is down a little bit.  I am sure this might have to do with some of her medications.  I am sure her family doctor is going to monitor all this.  Overall, her performance status is ECOG 2.   Medications:  Current Outpatient Medications:  .  amiodarone (PACERONE) 200 MG tablet, Take 1 tablet (200 mg total) by mouth daily., Disp: 90 tablet, Rfl: 3 .  aspirin 81 MG chewable tablet, Chew 1 tablet (81 mg total) by mouth daily., Disp: 90 tablet, Rfl: 0 .  Calcium Carbonate-Vitamin D (CALCIUM-D PO), Take 1 tablet by mouth daily., Disp: , Rfl:  .  clindamycin (CLEOCIN) 300 MG capsule, Take 600 mg by mouth as directed. Take 1 hour prior to dental visits., Disp: , Rfl:  .  diltiazem (CARDIZEM CD) 120 MG 24 hr capsule, TAKE 1  CAPSULE BY MOUTH ONCE DAILY, Disp: 90 capsule, Rfl: 0 .  ELIQUIS 5 MG TABS tablet, TAKE 1 TABLET BY MOUTH TWICE (2) DAILY, Disp: 60 tablet, Rfl: 3 .  exemestane (AROMASIN) 25 MG tablet, TAKE 1 TABLET BY MOUTH ONCE A DAY AFTER BREAKFAST., Disp: 90 tablet, Rfl: 3 .  ezetimibe (ZETIA) 10 MG tablet, TAKE 1 TABLET BY MOUTH ONCE A DAY, Disp: 90 tablet, Rfl: 0 .  furosemide (LASIX) 40 MG tablet, Take 1 tablet (40 mg total) by mouth as needed. For weight 144 or greater, Disp: 30 tablet, Rfl: 5 .  levothyroxine (SYNTHROID, LEVOTHROID) 137 MCG tablet, Take 1 tablet (137 mcg total) by mouth daily before breakfast., Disp: 90 tablet, Rfl: 3 .  metoprolol succinate (TOPROL-XL) 50 MG 24 hr tablet, TAKE 1 TABLET BY MOUTH DAILY. TAKE WITH OR IMMEDIATELY FOLLOWING A MEAL, Disp: 30 tablet, Rfl: 1 .  Multiple Vitamin (MULTIVITAMIN WITH MINERALS) TABS tablet, Take 1 tablet by mouth daily., Disp: , Rfl:  .  potassium chloride (K-DUR) 10 MEQ tablet, Take 2 tablets (20 mEq total) by mouth as needed. Only take 20 meq if you take lasix, Disp: 60 tablet, Rfl: 5  Allergies:  Allergies  Allergen Reactions  . Penicillins Anaphylaxis and Swelling    FACIAL SWELLING PATIENT HAS HAD A PCN REACTION WITH IMMEDIATE RASH, FACIAL/TONGUE/THROAT  SWELLING, SOB, OR LIGHTHEADEDNESS WITH HYPOTENSION:  #  #  YES  #  #  Has patient had a PCN reaction causing severe rash involving mucus membranes or skin necrosis: No Has patient had a PCN reaction that required hospitalization: No Has patient had a PCN reaction occurring within the last 10 years: No  . Propoxyphene N-Acetaminophen Swelling    tongue swelling  . Evolocumab Other (See Comments)    Myalgias, fatigue, some itching and burning on her feet,  Arthralgia (Joint Pain)  . Levaquin [Levofloxacin] Hives  . Statins Other (See Comments)    Joint pain  . Cortisone Rash  . Myrbetriq [Mirabegron] Rash    Rash on face  . Pseudoephedrine Other (See Comments)    Facial flushing     Past Medical History, Surgical history, Social history, and Family History were reviewed and updated.  Review of Systems: Review of Systems  Constitutional: Negative.   HENT: Negative.   Eyes: Negative.   Respiratory: Negative.   Cardiovascular: Negative.   Gastrointestinal: Negative.   Genitourinary: Negative.   Musculoskeletal: Positive for back pain and joint pain.  Skin: Negative.   Neurological: Positive for focal weakness.  Endo/Heme/Allergies: Negative.   Psychiatric/Behavioral: Negative.      Physical Exam:  weight is 141 lb (64 kg). Her oral temperature is 97.3 F (36.3 C) (abnormal). Her blood pressure is 110/66 and her pulse is 57 (abnormal). Her respiration is 16 and oxygen saturation is 100%.   Physical Exam Vitals signs reviewed.  HENT:     Head: Normocephalic and atraumatic.  Eyes:     Pupils: Pupils are equal, round, and reactive to light.  Neck:     Musculoskeletal: Normal range of motion.  Cardiovascular:     Rate and Rhythm: Normal rate and regular rhythm.     Heart sounds: Normal heart sounds.  Pulmonary:     Effort: Pulmonary effort is normal.     Breath sounds: Normal breath sounds.  Abdominal:     General: Bowel sounds are normal.     Palpations: Abdomen is soft.  Musculoskeletal: Normal range of motion.        General: No tenderness or deformity.  Lymphadenopathy:     Cervical: No cervical adenopathy.  Skin:    General: Skin is warm and dry.     Findings: No erythema or rash.  Neurological:     Mental Status: She is alert and oriented to person, place, and time.  Psychiatric:        Behavior: Behavior normal.        Thought Content: Thought content normal.        Judgment: Judgment normal.     Lab Results  Component Value Date   WBC 4.7 05/18/2019   HGB 12.5 05/18/2019   HCT 38.2 05/18/2019   MCV 97.7 05/18/2019   PLT 153 05/18/2019     Chemistry      Component Value Date/Time   NA 138 05/18/2019 0851   NA 140  06/09/2017 0758   NA 141 10/11/2016 0844   K 3.8 05/18/2019 0851   K 4.5 06/09/2017 0758   K 4.1 10/11/2016 0844   CL 99 05/18/2019 0851   CL 106 06/09/2017 0758   CO2 29 05/18/2019 0851   CO2 28 06/09/2017 0758   CO2 25 10/11/2016 0844   BUN 44 (H) 05/18/2019 0851   BUN 17 06/09/2017 0758   BUN 20.3 10/11/2016 0844   CREATININE 2.06 (H) 05/18/2019 0263  CREATININE 1.2 06/09/2017 0758   CREATININE 1.1 10/11/2016 0844      Component Value Date/Time   CALCIUM 8.4 (L) 05/18/2019 0851   CALCIUM 9.1 06/09/2017 0758   CALCIUM 9.5 10/11/2016 0844   ALKPHOS 41 05/18/2019 0851   ALKPHOS 49 06/09/2017 0758   ALKPHOS 52 10/11/2016 0844   AST 12 (L) 05/18/2019 0851   AST 13 10/11/2016 0844   ALT 8 05/18/2019 0851   ALT 19 06/09/2017 0758   ALT 10 10/11/2016 0844   BILITOT 0.7 05/18/2019 0851   BILITOT 0.83 10/11/2016 0844         Impression and Plan: Kim [name] is 84 year-old white female with stage IIA ductal carcinoma of the left breast. She had 4 positive lymph nodes. Her breast cancer was triple positive. She completed her adjuvant chemotherapy. She completed this in July of 2015.  She then went on to complete adjuvant Herceptin in June 2016.  For right now, I do not see any problems with respect to recurrent breast cancer.  I know that she does have a risk of recurrence by the 4+ lymph nodes.  She will stay on the Aromasin.  I realize that she does have other health problems.  Her heart is clearly known to determine her prognosis and her mortality.  We will continue to follow her up.  We will plan to get her back to see [place] in another 6 months.  T her back to see [place] in another 4 months.  [name], MD 8/14/202010:15 AM

## 2019-05-20 LAB — URINE CULTURE: Culture: >=100000 — AB

## 2019-05-21 ENCOUNTER — Telehealth: Payer: Self-pay | Admitting: *Deleted

## 2019-05-21 MED ORDER — NITROFURANTOIN MONOHYD MACRO 100 MG PO CAPS: 100.0000 mg | ORAL_CAPSULE | Freq: Two times a day (BID) | ORAL | 0 refills | Status: DC

## 2019-05-21 NOTE — Telephone Encounter (Signed)
Allergy to Cipro, Reviewed with MD. VO Macrobid entered and sent to pharmacy

## 2019-05-21 NOTE — Telephone Encounter (Signed)
-----   Message from [name], MD sent at 05/21/2019  7:04 AM EDT ----- Call - the E.coli in her urin is resistant to Bactrim.  Will need to call in Cipro 500 mg po BID x 5 days.  Thanks!! Laurey Arrow

## 2019-05-21 NOTE — Telephone Encounter (Signed)
Notified pt, lmovm request pt call back to confirm message rcvd.

## 2019-06-18 ENCOUNTER — Encounter: Payer: Self-pay | Admitting: Family Medicine

## 2019-06-18 ENCOUNTER — Telehealth (INDEPENDENT_AMBULATORY_CARE_PROVIDER_SITE_OTHER): Payer: Medicare Other | Admitting: Family Medicine

## 2019-06-18 ENCOUNTER — Other Ambulatory Visit (HOSPITAL_COMMUNITY): Payer: Self-pay | Admitting: Cardiology

## 2019-06-18 ENCOUNTER — Other Ambulatory Visit: Payer: Self-pay

## 2019-06-18 DIAGNOSIS — J029 Acute pharyngitis, unspecified: Secondary | ICD-10-CM

## 2019-06-18 MED ORDER — CEFUROXIME AXETIL 500 MG PO TABS: 500.0000 mg | ORAL_TABLET | Freq: Two times a day (BID) | ORAL | 0 refills | Status: DC

## 2019-06-18 NOTE — Progress Notes (Signed)
Virtual Visit via Telephone Note  I connected with the patient on 06/18/19 at  1:00 PM EDT by telephone and verified that I am speaking with the correct person using two identifiers. We attempted to connect virtually but we had technical difficulties with the audio and video.     I discussed the limitations, risks, security and privacy concerns of performing an evaluation and management service by telephone and the availability of in person appointments. I also discussed with the patient that there may be a patient responsible charge related to this service. The patient expressed understanding and agreed to proceed.  Location patient: home Location provider: work or home office Participants present for the call: patient, provider Patient did not have a visit in the prior 7 days to address this/these issue(s).   History of Present Illness: Here for 2 days of a very ST on the left side of the throat. No fever or headache, no cough or SOB. No body aches or NVD. She has had strep throat many times in the past and she thinks thi sis another episode of that. Using Tylenol and Alka Seltzer Plus.    Observations/Objective: Patient sounds cheerful and well on the phone. I do not appreciate any SOB. Speech and thought processing are grossly intact. Patient reported vitals:  Assessment and Plan: Pharyngitis, treat with Ceftin for 10 days. Recheck prn.  Alysia Penna, MD   Follow Up Instructions:     [phone] [phone] 11-20 [id number] 21-30 I did not refer this patient for an OV in the next 24 hours for this/these issue(s).  I discussed the assessment and treatment plan with the patient. The patient was provided an opportunity to ask questions and all were answered. The patient agreed with the plan and demonstrated an understanding of the instructions.   The patient was advised to call back or seek an in-person evaluation if the symptoms worsen or if the condition fails to improve as  anticipated.  I provided 12 minutes of non-face-to-face time during this encounter.   Alysia Penna, MD

## 2019-06-21 ENCOUNTER — Telehealth: Payer: Self-pay

## 2019-06-21 NOTE — Telephone Encounter (Signed)
messsage was read to the patient from [place]. Patient states that she will wait for Dr. Sarajane Jews to call her tomorrow. Please advise [phone]

## 2019-06-21 NOTE — Telephone Encounter (Signed)
Called pt to advise Dr. Barbie Banner annotation after visit note " The patient was advised to call back or seek an in-person evaluation if the symptoms worsen or if the condition fails to improve as anticipated."  Since Dr. Henreitta Cea is out of the office and no one is available was calling for pt to seek help at local UC but no answer

## 2019-06-21 NOTE — Telephone Encounter (Signed)
Copied from [place] [id number]. Topic: General - Other >> Jun 21, 2019  1:47 PM Carolyn Stare wrote: Pt said she was told to call back and she waned to let Dr Sarajane Jews know that she is still having chills and cold sweats  and is taking the medicine he sent in for her

## 2019-06-22 ENCOUNTER — Other Ambulatory Visit: Payer: Self-pay

## 2019-06-22 DIAGNOSIS — Z20822 Contact with and (suspected) exposure to covid-19: Secondary | ICD-10-CM

## 2019-06-22 NOTE — Telephone Encounter (Signed)
Called pt at provided number and it went to V[facility] and it does not have a vm set up.

## 2019-06-22 NOTE — Telephone Encounter (Signed)
Patient returning call, requesting to be contacted at number below for today only.     [phone]

## 2019-06-23 LAB — NOVEL CORONAVIRUS, NAA: SARS-CoV-2, NAA: NOT DETECTED

## 2019-06-25 NOTE — Telephone Encounter (Signed)
Pt stated that she feels a lot better. Pt articulated that she has 2 more days left of the medication that she has to take for her "kidneys". Pt stated she will call if she has any questions. Pt also given Covid test results and verbalized understanding.

## 2019-06-25 NOTE — Telephone Encounter (Signed)
Please call her to see how she is doing. If she still does not feel well, have her come into the office so I can examine her

## 2019-06-25 NOTE — Telephone Encounter (Signed)
FYI

## 2019-06-26 ENCOUNTER — Encounter (HOSPITAL_COMMUNITY): Payer: Medicare Other | Admitting: Cardiology

## 2019-07-21 ENCOUNTER — Other Ambulatory Visit (HOSPITAL_COMMUNITY): Payer: Self-pay | Admitting: Cardiology

## 2019-07-21 ENCOUNTER — Other Ambulatory Visit (HOSPITAL_COMMUNITY): Payer: Self-pay | Admitting: Internal Medicine

## 2019-08-04 ENCOUNTER — Other Ambulatory Visit (HOSPITAL_COMMUNITY): Payer: Self-pay | Admitting: Cardiology

## 2019-08-07 ENCOUNTER — Other Ambulatory Visit: Payer: Self-pay

## 2019-08-07 ENCOUNTER — Encounter (HOSPITAL_COMMUNITY): Payer: Self-pay | Admitting: Cardiology

## 2019-08-07 ENCOUNTER — Ambulatory Visit (HOSPITAL_COMMUNITY)
Admission: RE | Admit: 2019-08-07 | Discharge: 2019-08-07 | Disposition: A | Discharge status: Home / Self Care | Payer: Medicare Other | Source: Ambulatory Visit | Attending: Cardiology | Admitting: Cardiology

## 2019-08-07 VITALS — BP 135/75 | HR 73 | Wt 149.6 lb

## 2019-08-07 DIAGNOSIS — I340 Nonrheumatic mitral (valve) insufficiency: Secondary | ICD-10-CM | POA: Diagnosis not present

## 2019-08-07 DIAGNOSIS — Z8249 Family history of ischemic heart disease and other diseases of the circulatory system: Secondary | ICD-10-CM | POA: Insufficient documentation

## 2019-08-07 DIAGNOSIS — Z955 Presence of coronary angioplasty implant and graft: Secondary | ICD-10-CM | POA: Diagnosis not present

## 2019-08-07 DIAGNOSIS — Z853 Personal history of malignant neoplasm of breast: Secondary | ICD-10-CM | POA: Diagnosis not present

## 2019-08-07 DIAGNOSIS — Z79890 Hormone replacement therapy (postmenopausal): Secondary | ICD-10-CM | POA: Diagnosis not present

## 2019-08-07 DIAGNOSIS — I480 Paroxysmal atrial fibrillation: Secondary | ICD-10-CM | POA: Diagnosis not present

## 2019-08-07 DIAGNOSIS — I110 Hypertensive heart disease with heart failure: Secondary | ICD-10-CM | POA: Insufficient documentation

## 2019-08-07 DIAGNOSIS — I5032 Chronic diastolic (congestive) heart failure: Secondary | ICD-10-CM | POA: Insufficient documentation

## 2019-08-07 DIAGNOSIS — Z7901 Long term (current) use of anticoagulants: Secondary | ICD-10-CM | POA: Diagnosis not present

## 2019-08-07 DIAGNOSIS — I421 Obstructive hypertrophic cardiomyopathy: Secondary | ICD-10-CM | POA: Diagnosis not present

## 2019-08-07 DIAGNOSIS — E785 Hyperlipidemia, unspecified: Secondary | ICD-10-CM | POA: Insufficient documentation

## 2019-08-07 DIAGNOSIS — Z79899 Other long term (current) drug therapy: Secondary | ICD-10-CM | POA: Diagnosis not present

## 2019-08-07 DIAGNOSIS — E039 Hypothyroidism, unspecified: Secondary | ICD-10-CM | POA: Insufficient documentation

## 2019-08-07 DIAGNOSIS — I2510 Atherosclerotic heart disease of native coronary artery without angina pectoris: Secondary | ICD-10-CM | POA: Diagnosis not present

## 2019-08-07 DIAGNOSIS — Z9889 Other specified postprocedural states: Secondary | ICD-10-CM | POA: Diagnosis not present

## 2019-08-07 LAB — BASIC METABOLIC PANEL
Anion gap: 10 (ref 5–15)
BUN: 22 mg/dL (ref 8–23)
CO2: 20 mmol/L — ABNORMAL LOW (ref 22–32)
Calcium: 8.6 mg/dL — ABNORMAL LOW (ref 8.9–10.3)
Chloride: 108 mmol/L (ref 98–111)
Creatinine, Ser: 1.24 mg/dL — ABNORMAL HIGH (ref 0.44–1.00)
GFR calc Af Amer: 47 mL/min — ABNORMAL LOW (ref >60)
GFR calc non Af Amer: 40 mL/min — ABNORMAL LOW (ref >60)
Glucose, Bld: 169 mg/dL — ABNORMAL HIGH (ref 70–99)
Potassium: 4.6 mmol/L (ref 3.5–5.1)
Sodium: 138 mmol/L (ref 135–145)

## 2019-08-07 LAB — CBC
HCT: 38.1 % (ref 36.0–46.0)
Hemoglobin: 12.4 g/dL (ref 12.0–15.0)
MCH: 33.1 pg (ref 26.0–34.0)
MCHC: 32.5 g/dL (ref 30.0–36.0)
MCV: 101.6 fL — ABNORMAL HIGH (ref 80.0–100.0)
Platelets: 156 10*3/uL (ref 150–400)
RBC: 3.75 MIL/uL — ABNORMAL LOW (ref 3.87–5.11)
RDW: 12.5 % (ref 11.5–15.5)
WBC: 5.1 10*3/uL (ref 4.0–10.5)
nRBC: 0 % (ref 0.0–0.2)

## 2019-08-07 NOTE — Patient Instructions (Signed)
STOP Aspirin  STOP Amiodarone  Labs today We will only contact you if something comes back abnormal or we need to make some changes. Otherwise no news is good news!  Your physician recommends that you schedule a follow-up appointment in: 3 months with Dr Aundra Dubin.  At the [facility], you and your health needs are our priority. As part of our continuing mission to provide you with exceptional heart care, we have created designated Provider Care Teams. These Care Teams include your primary Cardiologist (physician) and Advanced Practice Providers (APPs- Physician Assistants and Nurse Practitioners) who all work together to provide you with the care you need, when you need it.   You may see any of the following providers on your designated Care Team at your next follow up: Marland Kitchen Dr Glori Bickers . Dr [name] . Darrick Grinder, NP . Lyda Jester, PA   Please be sure to bring in all your medications bottles to every appointment.

## 2019-08-08 NOTE — Progress Notes (Signed)
Patient ID: Kim [name], female   DOB: 08-Mar-1935, 84 y.o.   MRN: [id number] PCP: Dr. Sarajane Jews Oncologist: Dr. Marin Olp Cardiology: Dr. Aundra Dubin  84 y.o. with history of CAD, HCM, paroxysmal atrial fibrillation, and mitral regurgitation presents for followup of HCM, CHF, MR.  Patient had breast cancer in 2015 and received treatment involving Herceptin.  During breast cancer treatment, she developed unstable angina and ended up getting a DES to the mid RCA in 3/15.  Patient additionally has a history of HOCM.  This has been recognized on prior echoes.  She has severe asymmetric basal septal hypertrophy and SAM with LVOT gradient peak 58 mmHg on echo in 3/15 along with moderate MR.  Repeat echo in 7/15 showed LVOT gradient down to 30 mmHg on higher beta blocker.  Repeat echo in 11/15 showed no significant LVOT gradient but SAM still present.  Echo (8/16) showed asymmetric septal hypertrophy, No SAM, no significant LVOT gradient.  Echo 4/18 showed EF 55-60%, small LVOT gradient with severe asymmetric septal hypertrophy, mild MR, PASP 32 mmHg.    She was admitted in 1/20 with symptomatic atrial fibrillation with RVR, this was a new diagnosis.  She was started on Eliquis and cardioverted after TEE back to NSR.  TEE showed severe mitral regurgitation that appeared to be due mostly to posterior leaflet prolapse, there was minimal systolic anterior motion.   In 3/20, she had Mitraclip placement. Echo in 6/20 showed EF 60-65%, severe asymmetric septal hypertrophy, normal RV, s/p Mitraclip with mean gradient 3 mmHg and trivial MR.   Main complaints today are low back pain and hip pain. She has had injections in both areas with incomplete relief.  Activity is very limited by her back.  However, no exertional dyspnea.  No chest pain.  No lightheadedness.  She is not sure what to do with her amiodarone, she has been taking it "prn."    Labs (3/15): K 4.5, creatinine 0.84, LDL 91, HDL 46 Labs (5/15): K 3.9, creatinine  1.1 Labs (12/15): LDL 80, HDL 28, hemoglobin 10.9 Labs (1/16): K 3.7, creatinine 0.8 Labs (2/16): K 3.6, creatinine 1.2, HCT 34.4 Labs (6/16): K 4.1, creatinine 1.24 Labs (7/17): K 4.1, creatinine 1.2, HCT 38.8, LDL 143 Labs (1/18): LDL 142 Labs (5/18): K 4.4, creatinine 1.0 Labs (1/20): K 3.9, creatinine 1.27, LDL 95 Labs (8/20): K 3.8, creatinine 2.06, LFTs normal  ECG (personally reviewed): NSR, 1st degree AVB, old ASMI, lateral TWIs  PMH: 1. CAD: Unstable angina 3/15 with LHC showing 99% mRCA stenosis, treated with DES to mRCA.  2. Hypothyroidism 3. Diverticulosis 4. HTN 5. H/o TAH/BSO 6. Breast cancer: s/p lumpectomy with lymph node biopsy in 2/15.  4/10 nodes positive.  She was started on docetaxol/carboplatin/Herceptin in 3/15 with plan for 6 cycles chemo.  7. Hypertrophic obstructive cardiomyopathy: Echo (3/15) with severe focal basal septal hypertrophy (22 mm), narrow LV outflow tract with mitral valve SAM and 58 mmHg peak LVOT resting gradient, EF 60-65%, moderate MR, moderate LAE, normal RV, lateral s' 10.4 cm/sec.  Patient says that her 2 grown sons has had echoes to screen for HOCM.  Echo (4/15) with EF 65-70%, severe focal basal septal hypertrophy, LVOT gradient 33 mmHg, SAM was present with moderate MR, normal RV size and systolic function, lateral S' 10.6, GLS -17.5%.  Cardiac MRI (5/15) with EF 65%, moderate asymmetric septal hypertrophy, systolic anterior motion of the mitral valve with moderate MR, there was no delayed enhancement.  Echo (7/15) with EF 65-70%, moderate ASH,  peak LVOT gradient 30 mmHg, SAM with moderate MR, moderate to severe LAE.  Echo (11/15) with EF 55-60%, GLS -15%, no significant LVOT gradient, systolic anterior motion of the mitral valve with mild MR, normal RV size and systolic function. Echo (4/14) with EF 55-60%, severe asymmetric septal hypertrophy, LVOT gradient 20 mmHg, mild-moderate MR, GLS -18%.  - Echo (8/16): EF 60-65%, asymmetric septal  hypertrophy, no significant LV outflow tract gradient, mild MR.   - Echo (4/18): EF 55-60%, small LVOT gradient with severe asymmetric septal hypertrophy, mild MR, PASP 32 mmHg.   - TEE (1/20): EF 55-60%, moderate-severe asymmetric septal hypertrophy, no LVOT gradient, normal RV size and systolic function, severe LAE, severe MR with posterior MV leaflet prolapse and minimal SAM.   - Echo (6/20): EF 60-65%, severe asymmetric septal hypertrophy, normal RV, s/p Mitraclip with mean gradient 3 mmHg and trivial MR.  8. Hyperlipidemia: Myalgias with atorvastatin, myalgias with Repatha.  9. Atrial fibrillation: Paroxysmal, first noted in 1/20.  10. Mitral regurgitation: Appears severe on 1/20 TEE.  Has posterior leaflet prolapse with minimal SAM.  - Mitraclip 3/20.   SH: Married, 2 children, retired, nonsmoker.   FH: No family history of HOCM or sudden death.  There is a family history of CAD.   ROS: All systems reviewed and negative except as per HPI.    Current Outpatient Medications  Medication Sig Dispense Refill  . Calcium Carbonate-Vitamin D (CALCIUM-D PO) Take 1 tablet by mouth daily.    . cefUROXime (CEFTIN) 500 MG tablet Take 1 tablet (500 mg total) by mouth 2 (two) times daily with a meal. 20 tablet 0  . clindamycin (CLEOCIN) 300 MG capsule Take 600 mg by mouth as directed. Take 1 hour prior to dental visits.    Marland Kitchen diltiazem (CARDIZEM CD) 120 MG 24 hr capsule TAKE 1 CAPSULE BY MOUTH ONCE DAILY 90 capsule 0  . ELIQUIS 5 MG TABS tablet TAKE 1 TABLET BY MOUTH TWICE A DAY 180 tablet 3  . exemestane (AROMASIN) 25 MG tablet TAKE 1 TABLET BY MOUTH ONCE A DAY AFTER BREAKFAST. 90 tablet 3  . ezetimibe (ZETIA) 10 MG tablet TAKE 1 TABLET BY MOUTH ONCE DAILY 90 tablet 0  . furosemide (LASIX) 40 MG tablet Take 1 tablet (40 mg total) by mouth as needed. For weight 144 or greater 30 tablet 5  . levothyroxine (SYNTHROID, LEVOTHROID) 137 MCG tablet Take 1 tablet (137 mcg total) by mouth daily before  breakfast. 90 tablet 3  . metoprolol succinate (TOPROL-XL) 50 MG 24 hr tablet TAKE 1 TABLET BY MOUTH ONCE A DAY WITH OR IMMEDIATELY FOLLOWING A MEAL. 30 tablet 1  . Multiple Vitamin (MULTIVITAMIN WITH MINERALS) TABS tablet Take 1 tablet by mouth daily.    . potassium chloride (K-DUR) 10 MEQ tablet Take 2 tablets (20 mEq total) by mouth as needed. Only take 20 meq if you take lasix 60 tablet 5   No current facility-administered medications for this encounter.    BP 135/75   Pulse 73   Wt 67.9 kg (149 lb 9.6 oz)   LMP 10/19/1991   SpO2 99%   BMI 22.75 kg/m  General: NAD Neck: No JVD, no thyromegaly or thyroid nodule.  Lungs: Clear to auscultation bilaterally with normal respiratory effort. CV: Nondisplaced PMI.  Heart regular S1/S2, no S3/S4, 3/6 SEM RUSB.  No peripheral edema.  No carotid bruit.  Normal pedal pulses.  Abdomen: Soft, nontender, no hepatosplenomegaly, no distention.  Skin: Intact without lesions or rashes.  Neurologic: Alert and oriented x 3.  Psych: Normal affect. Extremities: No clubbing or cyanosis.  HEENT: Normal.   Assessment/Plan: 1. CAD: Status post PCI for unstable angina in 3/15 with DES to [facility]. No chest pain.  - No ASA, taking Eliquis.    - Unable to tolerate statins or Repatha, she is on Zetia.  2. Hyperlipidemia: Myalgias with Crestor, Lipitor, and pravastatin.  Myalgias with Repatha.  She has tolerated Zetia 10 mg daily. LDL 95 in 1/20, not at goal but not terrible.  3. Hypertrophic obstructive cardiomyopathy: No family history of HOCM or sudden death (interestingly, her husband has HOCM). Cardiac MRI in 5/15 showed no delayed enhancement.  TEE in 4/18 showed severe asymmetric septal hypertrophy but no LVOT gradient.  There was minimal SAM.   - Continue current Toprol XL and diltiazem CD.  - Per patient, her 2 sons have had screening echoes to look for hypertrophic cardiomyopathy and did not show signs of HOCM.   4. Atrial fibrillation: Noted initially in  1/20, suspect this was triggered by mitral regurgitation and HCM.  She was cardioverted to NSR.  She was on amiodarone for a few months, this was stopped after Mitaclip. She is in NSR today.  - She has been taking amiodarone "as needed."  Not sure what she means by this but asked her to stop it entirely.  - Continue Eliquis.  Check BMET today to make sure she does not need to decrease the dose.  I will also get a CBC.  5. Chronic diastolic CHF: Suspect this is triggered by atrial fibrillation and mitral regurgitation.  She is now using Lasix as needed. 6. Mitral regurgitation: I suspect that mitral regurgitation may have beenthe trigger for atrial fibrillation.  I think the mechanism of MR was most likely mitral valve prolapse. There appeared to be only minimal SAM on TEE. The left atrium was severely enlarged. She has now had Mitraclip placement, echo in 6/20 showed mean MV gradient 3 mmHg with trivial MR.   Followup in 3 months.     [name] 08/08/2019

## 2019-08-09 ENCOUNTER — Telehealth (HOSPITAL_COMMUNITY): Payer: Self-pay

## 2019-08-09 NOTE — Telephone Encounter (Signed)
Called patient to let her know to continue eliquis at same dose as kidney function is improved. Message given to husband.  Verbalized understanding.

## 2019-08-09 NOTE — Telephone Encounter (Signed)
-----   Message from Larey Dresser, MD sent at 08/08/2019 10:23 PM EST ----- Please let Kim [name] know that she should continue the same dose of apixaban.  Her creatinine is improved.

## 2019-08-15 ENCOUNTER — Other Ambulatory Visit (HOSPITAL_COMMUNITY): Payer: Self-pay | Admitting: Cardiology

## 2019-08-29 ENCOUNTER — Other Ambulatory Visit: Payer: Self-pay | Admitting: Chiropractic Medicine

## 2019-08-29 DIAGNOSIS — M5416 Radiculopathy, lumbar region: Secondary | ICD-10-CM

## 2019-08-29 DIAGNOSIS — M25552 Pain in left hip: Secondary | ICD-10-CM

## 2019-09-12 ENCOUNTER — Other Ambulatory Visit: Payer: Self-pay

## 2019-09-12 ENCOUNTER — Emergency Department (HOSPITAL_COMMUNITY): Payer: Medicare Other

## 2019-09-12 ENCOUNTER — Emergency Department (HOSPITAL_COMMUNITY)
Admission: EM | Admit: 2019-09-12 | Discharge: 2019-09-12 | Disposition: A | Discharge status: Home / Self Care | Payer: Medicare Other | Attending: Emergency Medicine | Admitting: Emergency Medicine

## 2019-09-12 ENCOUNTER — Encounter (HOSPITAL_COMMUNITY): Payer: Self-pay | Admitting: Emergency Medicine

## 2019-09-12 DIAGNOSIS — I2510 Atherosclerotic heart disease of native coronary artery without angina pectoris: Secondary | ICD-10-CM | POA: Diagnosis not present

## 2019-09-12 DIAGNOSIS — E039 Hypothyroidism, unspecified: Secondary | ICD-10-CM | POA: Diagnosis not present

## 2019-09-12 DIAGNOSIS — Z853 Personal history of malignant neoplasm of breast: Secondary | ICD-10-CM | POA: Diagnosis not present

## 2019-09-12 DIAGNOSIS — Z7901 Long term (current) use of anticoagulants: Secondary | ICD-10-CM | POA: Diagnosis not present

## 2019-09-12 DIAGNOSIS — Z79899 Other long term (current) drug therapy: Secondary | ICD-10-CM | POA: Insufficient documentation

## 2019-09-12 DIAGNOSIS — Z96651 Presence of right artificial knee joint: Secondary | ICD-10-CM | POA: Insufficient documentation

## 2019-09-12 DIAGNOSIS — R079 Chest pain, unspecified: Secondary | ICD-10-CM | POA: Diagnosis not present

## 2019-09-12 DIAGNOSIS — J189 Pneumonia, unspecified organism: Secondary | ICD-10-CM | POA: Insufficient documentation

## 2019-09-12 DIAGNOSIS — I10 Essential (primary) hypertension: Secondary | ICD-10-CM | POA: Diagnosis not present

## 2019-09-12 LAB — CBC
HCT: 33.8 % — ABNORMAL LOW (ref 36.0–46.0)
Hemoglobin: 10.9 g/dL — ABNORMAL LOW (ref 12.0–15.0)
MCH: 32.9 pg (ref 26.0–34.0)
MCHC: 32.2 g/dL (ref 30.0–36.0)
MCV: 102.1 fL — ABNORMAL HIGH (ref 80.0–100.0)
Platelets: 145 10*3/uL — ABNORMAL LOW (ref 150–400)
RBC: 3.31 MIL/uL — ABNORMAL LOW (ref 3.87–5.11)
RDW: 12.8 % (ref 11.5–15.5)
WBC: 4.2 10*3/uL (ref 4.0–10.5)
nRBC: 0 % (ref 0.0–0.2)

## 2019-09-12 LAB — BASIC METABOLIC PANEL
Anion gap: 9 (ref 5–15)
BUN: 19 mg/dL (ref 8–23)
CO2: 24 mmol/L (ref 22–32)
Calcium: 8.8 mg/dL — ABNORMAL LOW (ref 8.9–10.3)
Chloride: 107 mmol/L (ref 98–111)
Creatinine, Ser: 1.14 mg/dL — ABNORMAL HIGH (ref 0.44–1.00)
GFR calc Af Amer: 52 mL/min — ABNORMAL LOW (ref >60)
GFR calc non Af Amer: 45 mL/min — ABNORMAL LOW (ref >60)
Glucose, Bld: 92 mg/dL (ref 70–99)
Potassium: 4.1 mmol/L (ref 3.5–5.1)
Sodium: 140 mmol/L (ref 135–145)

## 2019-09-12 LAB — TROPONIN I (HIGH SENSITIVITY)
Troponin I (High Sensitivity): 11 ng/L (ref <18)
Troponin I (High Sensitivity): 7 ng/L (ref <18)

## 2019-09-12 MED ORDER — SODIUM CHLORIDE 0.9% FLUSH: 3.0000 mL | Freq: Once | INTRAVENOUS | Status: DC

## 2019-09-12 MED ORDER — DOXYCYCLINE HYCLATE 100 MG PO CAPS: 100.0000 mg | ORAL_CAPSULE | Freq: Two times a day (BID) | ORAL | 0 refills | Status: DC

## 2019-09-12 NOTE — Discharge Instructions (Addendum)
Your testing today does not show any cardiac problems.  You may have a pneumonia in the left lung.  We are prescribing an antibiotic to treat that, and sent it to your pharmacy.  Make sure you're getting plenty of rest, and drinking a lot of fluids.  Return here if needed for problems.

## 2019-09-12 NOTE — ED Triage Notes (Signed)
Pt to triage by [facility]> C/o L sided non-radiating dull chest pain since 5am that woke her up.  Denies any other associated symptoms.  ASA 324mg  and NTG x 1.  18 g R hand.

## 2019-09-12 NOTE — ED Notes (Signed)
Walked patient to the bathroom patient did well 

## 2019-09-12 NOTE — ED Provider Notes (Signed)
[place] EMERGENCY DEPARTMENT Provider Note   CSN: [id number] Arrival date & time: 09/12/19  0802     History   Chief Complaint Chief Complaint  Patient presents with  . Chest Pain    HPI Kim [name] is a 84 y.o. female.     HPI   She presents for evaluation of chest discomfort, which started today.  She was transferred by EMS who treated her with aspirin and nitroglycerin, single dose.  She is anticoagulated, on Eliquis, for chronic atrial fibrillation.  Prior myocardial infarction.  Cardiac history remarkable for hypertrophic cardiomyopathy, congestive heart failure, and mitral regurgitation.  Last cardiac stenting was March 2015.  She describes the chest pain as causing her to awaken early this morning and been persistent until EMS gave her nitroglycerin.  She states the pain completely resolved after that.  She describes the pain only as "not that bad."  She has never taken nitroglycerin, previously.  She did not take any of her morning medications.  She lives at home with her husband.  She states that yesterday she was well.  She denies fever, chills, cough, shortness of breath, nausea, vomiting, diaphoresis, change in bowel or urinary habits.  There are no other known modifying factors.  Past Medical History:  Diagnosis Date  . Allergic rhinitis   . Allergy   . Arthritis    hands  . Breast cancer of upper-outer quadrant of left female breast ([place]) 11/08/13   finished chemo and radiation 2015  . CAD (coronary artery disease)    a. 12/2013 Cath/PCI: LM nl, LAD 20p, 38m, 30d, D1 30p, LCX 20p, 12m, OM1 20, Mo2 40p, RCA 30p, 56m (4.0x16 Promus Premier DES), 20d, RPL 30, RPDA 70p, 31m, EF 65-70%.  . Diverticulosis   . HOH (hard of hearing)    bilateral, wears hearing aids  . Hx of adenomatous colonic polyps 02/1999  . Hyperlipidemia   . Hypertension   . Hypertrophic obstructive cardiomyopathy (HOCM) [facility])    Cardiologist is Dr. [name]  .  Hypothyroidism   . Iron deficiency anemia due to chronic blood loss 01/07/2016  . Low back pain    gets ESI from Dr. Rennis Harding  . Malabsorption of iron 01/07/2016  . Myocardial infarction ([place]) 12/2013   very mild-with first chemo -placed stent x1  . Persistent atrial fibrillation with rapid ventricular response ([place]) 10/14/2018  . Personal history of radiation therapy 2015  . S/P radiation therapy 05/14/2014-06/26/2014   1) Left Breast  / 50 Gy in 25 fractions, 2) Left Supraclavicular fossa/ 47.5 Gy in 25 fractions, 3) Left Posterior Axillary boost / 4.825 Gy in 25 fractions, 4) Left Breast boost / 10 Gy in 5 fractions   . Tubulovillous adenoma of colon 12/2009   with HGD  . Wears hearing aid    left and right     Patient Active Problem List   Diagnosis Date Noted  . Severe mitral regurgitation 12/06/2018  . Persistent atrial fibrillation with rapid ventricular response ([place]) 10/14/2018  . HNP (herniated nucleus pulposus), lumbar 03/01/2018  . History of total knee replacement, right 11/13/2017  . OA (osteoarthritis) of knee 10/24/2017  . Chronic pain of right knee 06/17/2017  . Osteoporosis 07/30/2016  . Iron deficiency anemia due to chronic blood loss 01/07/2016  . Malabsorption of iron 01/07/2016  . Postoperative state 01/17/2015  . HOCM (hypertrophic obstructive cardiomyopathy) ([place]) 01/09/2014  . Breast cancer ([place]) 01/09/2014  . Coronary atherosclerosis of native coronary artery  12/20/2013  . CAD (coronary artery disease)   . Hyperlipidemia   . Breast cancer of upper-outer quadrant of left female breast ([place]) 11/05/2013  . DCIS (ductal carcinoma in situ) of breast 10/26/2013  . Hypertension 04/05/2012  . UNSPECIFIED HEARING LOSS 08/07/2010  . LOW BACK PAIN 04/22/2009  . CONSTIPATION 11/04/2008  . COLONIC POLYPS, HX OF 11/01/2008  . ALLERGIC RHINITIS 06/27/2007  . Hypothyroidism 06/22/2007    Past Surgical History:  Procedure Laterality Date  . ABDOMINAL HYSTERECTOMY    .  ANTERIOR AND POSTERIOR REPAIR N/A 01/17/2015   Procedure: CYSTOCELE REPAIR ;  Surgeon: Princess Bruins, MD;  Location: [facility];  Service: Gynecology;  Laterality: N/A;  . BILATERAL SALPINGOOPHORECTOMY     see Laurin Coder NP for GYN exams  . BREAST BIOPSY Left 10/18/2013  . BREAST BIOPSY Left 10/31/2013  . BREAST LUMPECTOMY Left 11/05/2013  . BREAST LUMPECTOMY WITH NEEDLE LOCALIZATION AND AXILLARY LYMPH NODE DISSECTION Left 11/05/2013   Procedure: LEFT BREAST LUMPECTOMY WITH NEEDLE LOCALIZATION and axillary lymph Node Dissection;  Surgeon: Edward Jolly, MD;  Location: [place];  Service: General;  Laterality: Left;  . BREAST SURGERY  11/2013   left lumpectomy  . CARDIOVERSION N/A 10/17/2018   Procedure: CARDIOVERSION;  Surgeon: Larey Dresser, MD;  Location: [facility] ENDOSCOPY;  Service: Cardiovascular;  Laterality: N/A;  . CARDIOVERSION N/A 11/08/2018   Procedure: CARDIOVERSION;  Surgeon: Larey Dresser, MD;  Location: [place];  Service: Cardiovascular;  Laterality: N/A;  . CATARACT EXTRACTION W/ INTRAOCULAR LENS  IMPLANT, BILATERAL  2012   bilateral  . COLONOSCOPY  11/04/2015   per Dr. Fuller Plan, adenomatous polyps, no repeats planned   . EYE SURGERY  11/22/2006   cataracts, bilateral, intraocular lens implant  . JOINT REPLACEMENT     2019  . LEFT HEART CATHETERIZATION WITH CORONARY ANGIOGRAM N/A 12/19/2013   Procedure: LEFT HEART CATHETERIZATION WITH CORONARY ANGIOGRAM;  Surgeon: Burnell Blanks, MD;  Location: [facility] CATH LAB;  Service: Cardiovascular;  Laterality: N/A;  . lumbar epidural steroid injection     lumber spinal stenosis  . LUMBAR LAMINECTOMY/DECOMPRESSION MICRODISCECTOMY Right 03/01/2018   Procedure: RIGHT LUMBAR FOUR- LUMBAR FIVE LAMINECTOMY/MICRODISCECTOMY;  Surgeon: [name], MD;  Location: [place];  Service: Neurosurgery;  Laterality: Right;  . MITRAL VALVE REPAIR N/A 12/06/2018   Procedure: MITRAL VALVE REPAIR;  Surgeon: Sherren Mocha, MD;   Location: [place] CV LAB;  Service: Cardiovascular;  Laterality: N/A;  . POLYPECTOMY    . PORTACATH PLACEMENT Right 12/11/2013   Procedure: INSERTION PORT-A-CATH;  Surgeon: Edward Jolly, MD;  Location: [place];  Service: General;  Laterality: Right;  Subclavian Vein;   . RIGHT/LEFT HEART CATH AND CORONARY ANGIOGRAPHY N/A 11/03/2018   Procedure: RIGHT/LEFT HEART CATH AND CORONARY ANGIOGRAPHY;  Surgeon: Larey Dresser, MD;  Location: [place] CV LAB;  Service: Cardiovascular;  Laterality: N/A;  . TEE WITHOUT CARDIOVERSION N/A 10/17/2018   Procedure: TRANSESOPHAGEAL ECHOCARDIOGRAM (TEE);  Surgeon: Larey Dresser, MD;  Location: [facility] ENDOSCOPY;  Service: Cardiovascular;  Laterality: N/A;  . TEE WITHOUT CARDIOVERSION N/A 11/08/2018   Procedure: TRANSESOPHAGEAL ECHOCARDIOGRAM (TEE);  Surgeon: Larey Dresser, MD;  Location: [facility] ENDOSCOPY;  Service: Cardiovascular;  Laterality: N/A;  . TOTAL KNEE ARTHROPLASTY Right 10/24/2017   Procedure: TOTAL RIGHT KNEE ARTHROPLASTY;  Surgeon: Gaynelle Arabian, MD;  Location: [facility];  Service: Orthopedics;  Laterality: Right;     OB History    Gravida  2   Para  2  Term      Preterm      AB      Living        SAB      TAB      Ectopic      Multiple      Live Births           Obstetric Comments  Menarche age [age], parity age 63, G47,P2,  No BC, HRT x 20 years         Home Medications    Prior to Admission medications   Medication Sig Start Date End Date Taking? Authorizing Provider  Calcium Carbonate-Vitamin D (CALCIUM-D PO) Take 1 tablet by mouth daily.   Yes [provider]  diltiazem (CARDIZEM CD) 120 MG 24 hr capsule TAKE 1 CAPSULE BY MOUTH ONCE DAILY Patient taking differently: Take 120 mg by mouth daily.  08/15/19  Yes Larey Dresser, MD  ELIQUIS 5 MG TABS tablet TAKE 1 TABLET BY MOUTH TWICE A DAY Patient taking differently: Take 5 mg by mouth 2 (two) times daily.  06/19/19  Yes Larey Dresser, MD  exemestane (AROMASIN) 25 MG tablet TAKE 1 TABLET BY MOUTH ONCE A DAY AFTER BREAKFAST. Patient taking differently: Take 25 mg by mouth daily after breakfast.  04/16/19  Yes [place], Holli Humbles, NP  ezetimibe (ZETIA) 10 MG tablet TAKE 1 TABLET BY MOUTH ONCE DAILY Patient taking differently: Take 10 mg by mouth daily.  07/23/19  Yes Larey Dresser, MD  levothyroxine (SYNTHROID, LEVOTHROID) 137 MCG tablet Take 1 tablet (137 mcg total) by mouth daily before breakfast. 08/08/18  Yes Laurey Morale, MD  metoprolol succinate (TOPROL-XL) 50 MG 24 hr tablet TAKE 1 TABLET BY MOUTH ONCE A DAY WITH OR IMMEDIATELY FOLLOWING A MEAL. Patient taking differently: Take 50 mg by mouth daily.  07/23/19  Yes Bensimhon, Shaune Pascal, MD  Multiple Vitamin (MULTIVITAMIN WITH MINERALS) TABS tablet Take 1 tablet by mouth daily.   Yes [provider]  doxycycline (VIBRAMYCIN) 100 MG capsule Take 1 capsule (100 mg total) by mouth 2 (two) times daily. One po bid x 7 days 09/12/19   Daleen Bo, MD  furosemide (LASIX) 40 MG tablet Take 1 tablet (40 mg total) by mouth as needed. For weight 144 or greater Patient not taking: Reported on 09/12/2019 02/19/19   Georgiana Shore, NP  potassium chloride (K-DUR) 10 MEQ tablet Take 2 tablets (20 mEq total) by mouth as needed. Only take 20 meq if you take lasix Patient not taking: Reported on 09/12/2019 01/15/19   Darrick Grinder D, NP  ferrous sulfate 325 (65 FE) MG tablet Take 325 mg by mouth 2 (two) times daily.    01/16/19  [provider]  pravastatin (PRAVACHOL) 80 MG tablet Take 1 tablet (80 mg total) by mouth every evening. 11/20/15 01/16/19  Larey Dresser, MD    Family History Family History  Problem Relation Age of Onset  . Alcohol abuse Father   . Kidney failure Mother   . Colon cancer Neg Hx   . Rectal cancer Neg Hx   . Stomach cancer Neg Hx   . Colon polyps Neg Hx     Social History Social History   Tobacco Use  . Smoking status: Never  Smoker  . Smokeless tobacco: Never Used  . Tobacco comment: never used tobacco  Substance Use Topics  . Alcohol use: No    Alcohol/week: 0.0 standard drinks  . Drug use: No  Allergies   Penicillins, Propoxyphene n-acetaminophen, Evolocumab, Levaquin [levofloxacin], Statins, Cortisone, Myrbetriq [mirabegron], and Pseudoephedrine   Review of Systems Review of Systems  All other systems reviewed and are negative.    Physical Exam Updated Vital Signs BP 125/79   Pulse 78   Temp 99.2 F (37.3 C) (Oral)   Resp (!) 21   LMP 10/19/1991   SpO2 97%   Physical Exam Vitals signs and nursing note reviewed.  Constitutional:      General: She is not in acute distress.    Appearance: She is well-developed and normal weight. She is not ill-appearing, toxic-appearing or diaphoretic.  HENT:     Head: Normocephalic and atraumatic.     Right Ear: External ear normal.     Left Ear: External ear normal.  Eyes:     Conjunctiva/sclera: Conjunctivae normal.     Pupils: Pupils are equal, round, and reactive to light.  Neck:     Musculoskeletal: Normal range of motion and neck supple.     Trachea: Phonation normal.  Cardiovascular:     Rate and Rhythm: Normal rate and regular rhythm.     Heart sounds: Normal heart sounds.  Pulmonary:     Effort: Pulmonary effort is normal.     Breath sounds: Normal breath sounds.  Abdominal:     General: There is no distension.     Palpations: Abdomen is soft.     Tenderness: There is no abdominal tenderness.  Musculoskeletal: Normal range of motion.        General: No swelling or tenderness.  Skin:    General: Skin is warm and dry.     Coloration: Skin is not jaundiced or pale.  Neurological:     Mental Status: She is alert and oriented to person, place, and time.     Cranial Nerves: No cranial nerve deficit.     Sensory: No sensory deficit.     Motor: No abnormal muscle tone.     Coordination: Coordination normal.  Psychiatric:        Mood  and Affect: Mood normal.        Behavior: Behavior normal.        Thought Content: Thought content normal.        Judgment: Judgment normal.      ED Treatments / Results  Labs (all labs ordered are listed, but only abnormal results are displayed) Labs Reviewed  BASIC METABOLIC PANEL - Abnormal; Notable for the following components:      Result Value   Creatinine, Ser 1.14 (*)    Calcium 8.8 (*)    GFR calc non Af Amer 45 (*)    GFR calc Af Amer 52 (*)    All other components within normal limits  CBC - Abnormal; Notable for the following components:   RBC 3.31 (*)    Hemoglobin 10.9 (*)    HCT 33.8 (*)    MCV 102.1 (*)    Platelets 145 (*)    All other components within normal limits  TROPONIN I (HIGH SENSITIVITY)  TROPONIN I (HIGH SENSITIVITY)    EKG EKG Interpretation  Date/Time:  Wednesday September 12 2019 08:13:34 EST Ventricular Rate:  65 PR Interval:  250 QRS Duration: 102 QT Interval:  446 QTC Calculation: 463 R Axis:   12 Text Interpretation: Sinus rhythm with 1st degree A-V block Anteroseptal infarct , age undetermined ST & T wave abnormality, consider lateral ischemia Abnormal ECG since last tracing no significant change Confirmed by Daleen Bo (  [id number]) on 09/12/2019 9:23:37 AM   Radiology Dg Chest 2 View  Result Date: 09/12/2019 CLINICAL DATA:  Left-sided nonradiating dull chest pain since 5 a.m. Pain woke her from sleep. EXAM: CHEST - 2 VIEW COMPARISON:  Two-view chest x-ray 12/05/2018 FINDINGS: The heart size upper limits of normal. Ill-defined posterior left lower lobe airspace disease is present. Upper lung fields are clear. Surgical clips are present in the left axilla. IMPRESSION: 1. Ill-defined posterior left lower lobe airspace disease is concerning for pneumonia. 2. Postoperative changes of the left axilla. Electronically Signed   By: San Morelle M.D.   On: 09/12/2019 08:44    Procedures Procedures (including critical care  time)  Medications Ordered in ED Medications  sodium chloride flush (NS) 0.9 % injection 3 mL (3 mLs Intravenous Not Given 09/12/19 1409)     Initial Impression / Assessment and Plan / ED Course  I have reviewed the triage vital signs and the nursing notes.  Pertinent labs & imaging results that were available during my care of the patient were reviewed by me and considered in my medical decision making (see chart for details).  Clinical Course as of Sep 11 1454  Wed Sep 12, 2019  0924 Vague left lower lobe infiltrate, nonspecific, interpreted by me  DG Chest 2 View [EW]  1227 Normal  Troponin I (High Sensitivity) [EW]  1227 Normal except elevated creatinine, low calcium, low GFR  Basic metabolic panel(!) [EW]  [id number] Normal except hemoglobin low, MCV high, platelets low  CBC(!) [EW]  1427 Normal delta troponin  Troponin I (High Sensitivity) [EW]    Clinical Course User Index [EW] Daleen Bo, MD        Patient Vitals for the past 24 hrs:  BP Temp Temp src Pulse Resp SpO2  09/12/19 1400 125/79 - - 78 (!) 21 97 %  09/12/19 1330 (!) 141/73 - - 61 18 98 %  09/12/19 1300 (!) 142/82 - - 62 (!) 26 100 %  09/12/19 1130 - - - 67 12 97 %  09/12/19 1000 136/69 - - - 18 -  09/12/19 0817 (!) 132/98 99.2 F (37.3 C) Oral 64 14 97 %    2:29 PM Reevaluation with update and discussion. After initial assessment and treatment, an updated evaluation reveals she is comfortable now has no further complaints.  Findings discussed and questions answered. Daleen Bo   Medical Decision Making: Nonspecific chest pain.  Delta troponin negative.  Doubt ACS, or metabolic instability.  Possible left lung infiltrate, contributing to discomfort.  Patient is nontoxic.  Doubt severe sepsis.  She is a candidate for discharge with outpatient management.  CRITICAL CARE-no Performed by: Daleen Bo  Nursing Notes Reviewed/ Care Coordinated Applicable Imaging Reviewed Interpretation of Laboratory  Data incorporated into ED treatment  The patient appears reasonably screened and/or stabilized for discharge and I doubt any other medical condition or other [facility] requiring further screening, evaluation, or treatment in the ED at this time prior to discharge.  Plan: Home Medications-continue usual; Home Treatments-rest, fluids; return here if the recommended treatment, does not improve the symptoms; Recommended follow up-PCP checkup 1 week and as needed   Final Clinical Impressions(s) / ED Diagnoses   Final diagnoses:  Nonspecific chest pain  Community acquired pneumonia of left lung, unspecified part of lung    ED Discharge Orders         Ordered    doxycycline (VIBRAMYCIN) 100 MG capsule  2 times daily     09/12/19 1454  Daleen Bo, MD 09/12/19 1455

## 2019-09-13 ENCOUNTER — Other Ambulatory Visit (HOSPITAL_COMMUNITY): Payer: Self-pay | Admitting: Internal Medicine

## 2019-09-21 ENCOUNTER — Other Ambulatory Visit: Payer: Self-pay

## 2019-09-21 ENCOUNTER — Ambulatory Visit
Admission: RE | Admit: 2019-09-21 | Discharge: 2019-09-21 | Disposition: A | Discharge status: Home / Self Care | Payer: Medicare Other | Source: Ambulatory Visit | Attending: Chiropractic Medicine | Admitting: Chiropractic Medicine

## 2019-09-21 DIAGNOSIS — M5416 Radiculopathy, lumbar region: Secondary | ICD-10-CM

## 2019-09-21 DIAGNOSIS — M25552 Pain in left hip: Secondary | ICD-10-CM

## 2019-10-03 ENCOUNTER — Ambulatory Visit: Payer: Medicare Other | Attending: Internal Medicine

## 2019-10-03 DIAGNOSIS — Z20822 Contact with and (suspected) exposure to covid-19: Secondary | ICD-10-CM

## 2019-10-04 LAB — NOVEL CORONAVIRUS, NAA: SARS-CoV-2, NAA: NOT DETECTED

## 2019-10-10 DIAGNOSIS — M5416 Radiculopathy, lumbar region: Secondary | ICD-10-CM | POA: Diagnosis not present

## 2019-10-12 DIAGNOSIS — M5416 Radiculopathy, lumbar region: Secondary | ICD-10-CM | POA: Diagnosis not present

## 2019-10-19 DIAGNOSIS — I340 Nonrheumatic mitral (valve) insufficiency: Secondary | ICD-10-CM | POA: Diagnosis not present

## 2019-10-19 DIAGNOSIS — I422 Other hypertrophic cardiomyopathy: Secondary | ICD-10-CM | POA: Diagnosis not present

## 2019-10-19 DIAGNOSIS — M549 Dorsalgia, unspecified: Secondary | ICD-10-CM | POA: Diagnosis not present

## 2019-10-19 DIAGNOSIS — I10 Essential (primary) hypertension: Secondary | ICD-10-CM | POA: Diagnosis not present

## 2019-10-19 DIAGNOSIS — Z0000 Encounter for general adult medical examination without abnormal findings: Secondary | ICD-10-CM | POA: Diagnosis not present

## 2019-10-19 DIAGNOSIS — I2510 Atherosclerotic heart disease of native coronary artery without angina pectoris: Secondary | ICD-10-CM | POA: Diagnosis not present

## 2019-10-19 DIAGNOSIS — R82998 Other abnormal findings in urine: Secondary | ICD-10-CM | POA: Diagnosis not present

## 2019-10-19 DIAGNOSIS — R2681 Unsteadiness on feet: Secondary | ICD-10-CM | POA: Diagnosis not present

## 2019-10-19 DIAGNOSIS — E038 Other specified hypothyroidism: Secondary | ICD-10-CM | POA: Diagnosis not present

## 2019-10-19 DIAGNOSIS — E7849 Other hyperlipidemia: Secondary | ICD-10-CM | POA: Diagnosis not present

## 2019-10-26 DIAGNOSIS — M961 Postlaminectomy syndrome, not elsewhere classified: Secondary | ICD-10-CM | POA: Diagnosis not present

## 2019-10-26 DIAGNOSIS — M5416 Radiculopathy, lumbar region: Secondary | ICD-10-CM | POA: Diagnosis not present

## 2019-10-29 ENCOUNTER — Other Ambulatory Visit (HOSPITAL_COMMUNITY): Payer: Self-pay | Admitting: Cardiology

## 2019-11-07 DIAGNOSIS — M961 Postlaminectomy syndrome, not elsewhere classified: Secondary | ICD-10-CM | POA: Diagnosis not present

## 2019-11-07 DIAGNOSIS — M5416 Radiculopathy, lumbar region: Secondary | ICD-10-CM | POA: Diagnosis not present

## 2019-11-12 ENCOUNTER — Ambulatory Visit: Payer: Medicare PPO | Attending: Internal Medicine

## 2019-11-12 DIAGNOSIS — Z23 Encounter for immunization: Secondary | ICD-10-CM | POA: Insufficient documentation

## 2019-11-12 NOTE — Progress Notes (Signed)
   Covid-19 Vaccination Clinic  Name:  Kim [name]    MRN: [id number] DOB: 1935-09-07  11/12/2019  [name] was observed post Covid-19 immunization for 15 minutes without incidence. She was provided with Vaccine Information Sheet and instruction to access the V-Safe system.   [name] was instructed to call 911 with any severe reactions post vaccine: Marland Kitchen Difficulty breathing  . Swelling of your face and throat  . A fast heartbeat  . A bad rash all over your body  . Dizziness and weakness    Immunizations Administered    Name Date Dose VIS Date Route   Pfizer COVID-19 Vaccine 11/12/2019  2:16 PM 0.3 mL 09/14/2019 Intramuscular   Manufacturer: [employer]   Lot: [id number]   [place]: [id number]

## 2019-11-13 ENCOUNTER — Ambulatory Visit (HOSPITAL_COMMUNITY)
Admission: RE | Admit: 2019-11-13 | Discharge: 2019-11-13 | Disposition: A | Discharge status: Home / Self Care | Payer: Medicare PPO | Source: Ambulatory Visit | Attending: Cardiology | Admitting: Cardiology

## 2019-11-13 ENCOUNTER — Other Ambulatory Visit: Payer: Self-pay

## 2019-11-13 VITALS — BP 130/72 | HR 60 | Wt 151.2 lb

## 2019-11-13 DIAGNOSIS — Z9889 Other specified postprocedural states: Secondary | ICD-10-CM | POA: Diagnosis not present

## 2019-11-13 DIAGNOSIS — I421 Obstructive hypertrophic cardiomyopathy: Secondary | ICD-10-CM

## 2019-11-13 DIAGNOSIS — Z79899 Other long term (current) drug therapy: Secondary | ICD-10-CM | POA: Insufficient documentation

## 2019-11-13 DIAGNOSIS — M5416 Radiculopathy, lumbar region: Secondary | ICD-10-CM | POA: Diagnosis not present

## 2019-11-13 DIAGNOSIS — I340 Nonrheumatic mitral (valve) insufficiency: Secondary | ICD-10-CM | POA: Insufficient documentation

## 2019-11-13 DIAGNOSIS — E039 Hypothyroidism, unspecified: Secondary | ICD-10-CM | POA: Diagnosis not present

## 2019-11-13 DIAGNOSIS — Z95818 Presence of other cardiac implants and grafts: Secondary | ICD-10-CM | POA: Diagnosis not present

## 2019-11-13 DIAGNOSIS — I10 Essential (primary) hypertension: Secondary | ICD-10-CM | POA: Insufficient documentation

## 2019-11-13 DIAGNOSIS — M961 Postlaminectomy syndrome, not elsewhere classified: Secondary | ICD-10-CM | POA: Diagnosis not present

## 2019-11-13 DIAGNOSIS — I5032 Chronic diastolic (congestive) heart failure: Secondary | ICD-10-CM

## 2019-11-13 DIAGNOSIS — Z7901 Long term (current) use of anticoagulants: Secondary | ICD-10-CM | POA: Insufficient documentation

## 2019-11-13 DIAGNOSIS — I480 Paroxysmal atrial fibrillation: Secondary | ICD-10-CM

## 2019-11-13 DIAGNOSIS — Z79890 Hormone replacement therapy (postmenopausal): Secondary | ICD-10-CM | POA: Insufficient documentation

## 2019-11-13 DIAGNOSIS — Z853 Personal history of malignant neoplasm of breast: Secondary | ICD-10-CM | POA: Insufficient documentation

## 2019-11-13 DIAGNOSIS — I2510 Atherosclerotic heart disease of native coronary artery without angina pectoris: Secondary | ICD-10-CM | POA: Insufficient documentation

## 2019-11-13 DIAGNOSIS — Z9221 Personal history of antineoplastic chemotherapy: Secondary | ICD-10-CM | POA: Insufficient documentation

## 2019-11-13 DIAGNOSIS — E785 Hyperlipidemia, unspecified: Secondary | ICD-10-CM | POA: Diagnosis not present

## 2019-11-13 LAB — BASIC METABOLIC PANEL
Anion gap: 10 (ref 5–15)
BUN: 23 mg/dL (ref 8–23)
CO2: 23 mmol/L (ref 22–32)
Calcium: 9 mg/dL (ref 8.9–10.3)
Chloride: 104 mmol/L (ref 98–111)
Creatinine, Ser: 1.3 mg/dL — ABNORMAL HIGH (ref 0.44–1.00)
GFR calc Af Amer: 44 mL/min — ABNORMAL LOW (ref >60)
GFR calc non Af Amer: 38 mL/min — ABNORMAL LOW (ref >60)
Glucose, Bld: 95 mg/dL (ref 70–99)
Potassium: 4.1 mmol/L (ref 3.5–5.1)
Sodium: 137 mmol/L (ref 135–145)

## 2019-11-13 LAB — LIPID PANEL
Cholesterol: 193 mg/dL (ref 0–200)
HDL: 43 mg/dL (ref >40)
LDL Cholesterol: 121 mg/dL — ABNORMAL HIGH (ref 0–99)
Total CHOL/HDL Ratio: 4.5 RATIO
Triglycerides: 146 mg/dL (ref <150)
VLDL: 29 mg/dL (ref 0–40)

## 2019-11-13 LAB — CBC
HCT: 37.7 % (ref 36.0–46.0)
Hemoglobin: 11.5 g/dL — ABNORMAL LOW (ref 12.0–15.0)
MCH: 30.9 pg (ref 26.0–34.0)
MCHC: 30.5 g/dL (ref 30.0–36.0)
MCV: 101.3 fL — ABNORMAL HIGH (ref 80.0–100.0)
Platelets: 162 10*3/uL (ref 150–400)
RBC: 3.72 MIL/uL — ABNORMAL LOW (ref 3.87–5.11)
RDW: 12.5 % (ref 11.5–15.5)
WBC: 4.6 10*3/uL (ref 4.0–10.5)
nRBC: 0 % (ref 0.0–0.2)

## 2019-11-13 NOTE — Progress Notes (Signed)
Patient ID: Kim [name], female   DOB: [birth year], 85 y.o.   MRN: [id number] PCP: Dr. Sarajane Jews Oncologist: Dr. Marin Olp Cardiology: Dr. Aundra Dubin  85 y.o. with history of CAD, HCM, paroxysmal atrial fibrillation, and mitral regurgitation presents for followup of HCM, CHF, MR.  Patient had breast cancer in 2015 and received treatment involving Herceptin.  During breast cancer treatment, she developed unstable angina and ended up getting a DES to the mid RCA in 3/15.  Patient additionally has a history of HOCM.  This has been recognized on prior echoes.  She has severe asymmetric basal septal hypertrophy and SAM with LVOT gradient peak 58 mmHg on echo in 3/15 along with moderate MR.  Repeat echo in 7/15 showed LVOT gradient down to 30 mmHg on higher beta blocker.  Repeat echo in 11/15 showed no significant LVOT gradient but SAM still present.  Echo (8/16) showed asymmetric septal hypertrophy, No SAM, no significant LVOT gradient.  Echo 4/18 showed EF 55-60%, small LVOT gradient with severe asymmetric septal hypertrophy, mild MR, PASP 32 mmHg.    She was admitted in 1/20 with symptomatic atrial fibrillation with RVR, this was a new diagnosis.  She was started on Eliquis and cardioverted after TEE back to NSR.  TEE showed severe mitral regurgitation that appeared to be due mostly to posterior leaflet prolapse, there was minimal systolic anterior motion.   In 3/20, she had Mitraclip placement. Echo in 6/20 showed EF 60-65%, severe asymmetric septal hypertrophy, normal RV, s/p Mitraclip with mean gradient 3 mmHg and trivial MR.   Her main complaint is back and hip pain. Generally doing well from cardiac standpoint.  No significant exertional dyspnea. No chest pain. No palpitations.  No lightheadedness.  No BRBPR/melena.  Weight is stable.    Labs (3/15): K 4.5, creatinine 0.84, LDL 91, HDL 46 Labs (5/15): K 3.9, creatinine 1.1 Labs (12/15): LDL 80, HDL 28, hemoglobin 10.9 Labs (1/16): K 3.7, creatinine 0.8 Labs  (2/16): K 3.6, creatinine 1.2, HCT 34.4 Labs (6/16): K 4.1, creatinine 1.24 Labs (7/17): K 4.1, creatinine 1.2, HCT 38.8, LDL 143 Labs (1/18): LDL 142 Labs (5/18): K 4.4, creatinine 1.0 Labs (1/20): K 3.9, creatinine 1.27, LDL 95 Labs (8/20): K 3.8, creatinine 2.06, LFTs normal Labs (12/20): K 4.1, creatinine 1.14  ECG (personally reviewed): NSR, lateral TWIs  PMH: 1. CAD: Unstable angina 3/15 with LHC showing 99% mRCA stenosis, treated with DES to mRCA.  2. Hypothyroidism 3. Diverticulosis 4. HTN 5. H/o TAH/BSO 6. Breast cancer: s/p lumpectomy with lymph node biopsy in 2/15.  4/10 nodes positive.  She was started on docetaxol/carboplatin/Herceptin in 3/15 with plan for 6 cycles chemo.  7. Hypertrophic obstructive cardiomyopathy: Echo (3/15) with severe focal basal septal hypertrophy (22 mm), narrow LV outflow tract with mitral valve SAM and 58 mmHg peak LVOT resting gradient, EF 60-65%, moderate MR, moderate LAE, normal RV, lateral s' 10.4 cm/sec.  Patient says that her 2 grown sons has had echoes to screen for HOCM.  Echo (4/15) with EF 65-70%, severe focal basal septal hypertrophy, LVOT gradient 33 mmHg, SAM was present with moderate MR, normal RV size and systolic function, lateral S' 10.6, GLS -17.5%.  Cardiac MRI (5/15) with EF 65%, moderate asymmetric septal hypertrophy, systolic anterior motion of the mitral valve with moderate MR, there was no delayed enhancement.  Echo (7/15) with EF 65-70%, moderate ASH, peak LVOT gradient 30 mmHg, SAM with moderate MR, moderate to severe LAE.  Echo (11/15) with EF 55-60%, GLS -15%, no  significant LVOT gradient, systolic anterior motion of the mitral valve with mild MR, normal RV size and systolic function. Echo (4/14) with EF 55-60%, severe asymmetric septal hypertrophy, LVOT gradient 20 mmHg, mild-moderate MR, GLS -18%.  - Echo (8/16): EF 60-65%, asymmetric septal hypertrophy, no significant LV outflow tract gradient, mild MR.   - Echo (4/18): EF  55-60%, small LVOT gradient with severe asymmetric septal hypertrophy, mild MR, PASP 32 mmHg.   - TEE (1/20): EF 55-60%, moderate-severe asymmetric septal hypertrophy, no LVOT gradient, normal RV size and systolic function, severe LAE, severe MR with posterior MV leaflet prolapse and minimal SAM.   - Echo (6/20): EF 60-65%, severe asymmetric septal hypertrophy, normal RV, s/p Mitraclip with mean gradient 3 mmHg and trivial MR.  8. Hyperlipidemia: Myalgias with atorvastatin, myalgias with Repatha.  9. Atrial fibrillation: Paroxysmal, first noted in 1/20.  10. Mitral regurgitation: Appears severe on 1/20 TEE.  Has posterior leaflet prolapse with minimal SAM.  - Mitraclip 3/20.   SH: Married, 2 children, retired, nonsmoker.   FH: No family history of HOCM or sudden death.  There is a family history of CAD.   ROS: All systems reviewed and negative except as per HPI.    Current Outpatient Medications  Medication Sig Dispense Refill  . amoxicillin (AMOXIL) 500 MG capsule amoxicillin 500 mg capsule    . apixaban (ELIQUIS) 5 MG TABS tablet Take 5 mg by mouth 2 (two) times daily.    . Calcium Carbonate-Vitamin D (CALCIUM-D PO) Take 1 tablet by mouth daily.    Marland Kitchen diltiazem (CARDIZEM CD) 120 MG 24 hr capsule TAKE 1 CAPSULE BY MOUTH ONCE DAILY 90 capsule 0  . doxycycline (VIBRAMYCIN) 100 MG capsule Take 1 capsule (100 mg total) by mouth 2 (two) times daily. One po bid x 7 days 14 capsule 0  . exemestane (AROMASIN) 25 MG tablet Take 25 mg by mouth daily after breakfast.    . ezetimibe (ZETIA) 10 MG tablet TAKE 1 TABLET BY MOUTH ONCE DAILY 90 tablet 0  . furosemide (LASIX) 40 MG tablet Take 1 tablet (40 mg total) by mouth as needed. For weight 144 or greater 30 tablet 5  . levothyroxine (SYNTHROID, LEVOTHROID) 137 MCG tablet Take 1 tablet (137 mcg total) by mouth daily before breakfast. 90 tablet 3  . metoprolol succinate (TOPROL-XL) 50 MG 24 hr tablet TAKE 1 TABLET BY MOUTH ONCE DAILY WITH OR IMMEDIATELY  FOLLOWING A MEAL 30 tablet 1  . Multiple Vitamin (MULTIVITAMIN WITH MINERALS) TABS tablet Take 1 tablet by mouth daily.    . potassium chloride (K-DUR) 10 MEQ tablet Take 2 tablets (20 mEq total) by mouth as needed. Only take 20 meq if you take lasix 60 tablet 5   No current facility-administered medications for this encounter.   BP 130/72   Pulse 60   Wt 68.6 kg (151 lb 3.2 oz)   LMP 10/19/1991   SpO2 98%   BMI 22.99 kg/m  General: NAD Neck: No JVD, no thyromegaly or thyroid nodule.  Lungs: Clear to auscultation bilaterally with normal respiratory effort. CV: Nondisplaced PMI.  Heart regular S1/S2, no S3/S4, 2/6 SEM RUSB.  No peripheral edema.  No carotid bruit.  Normal pedal pulses.  Abdomen: Soft, nontender, no hepatosplenomegaly, no distention.  Skin: Intact without lesions or rashes.  Neurologic: Alert and oriented x 3.  Psych: Normal affect. Extremities: No clubbing or cyanosis.  HEENT: Normal.   Assessment/Plan: 1. CAD: Status post PCI for unstable angina in 3/15 with DES  to [facility]. No chest pain.  - No ASA, taking Eliquis.    - Unable to tolerate statins or Repatha, she is on Zetia.  2. Hyperlipidemia: Myalgias with Crestor, Lipitor, and pravastatin.  Myalgias with Repatha.  She has tolerated Zetia 10 mg daily.  - Check lipids today.   3. Hypertrophic obstructive cardiomyopathy: No family history of HOCM or sudden death (interestingly, her husband has HOCM). Cardiac MRI in 5/15 showed no delayed enhancement.  TEE in 4/18 showed severe asymmetric septal hypertrophy but no LVOT gradient.  There was minimal SAM.   - Continue current Toprol XL and diltiazem CD.  - Per patient, her 2 sons have had screening echoes to look for hypertrophic cardiomyopathy and did not show signs of HOCM.    - Repeat echo to reassess HOCM at followup in 4 months.  4. Atrial fibrillation: Noted initially in 1/20, suspect this was triggered by mitral regurgitation and HCM.  She was cardioverted to NSR.   She was on amiodarone for a few months, this was stopped after Mitaclip. She is in NSR today.  - Continue Eliquis. CBC today.  5. Chronic diastolic CHF: Suspect this is triggered by atrial fibrillation and mitral regurgitation.  She is now using Lasix as needed. She does not look volume overloaded.  6. Mitral regurgitation: I suspect that mitral regurgitation may have beenthe trigger for atrial fibrillation.  I think the mechanism of MR was most likely mitral valve prolapse. There appeared to be only minimal SAM on TEE. The left atrium was severely enlarged. She has now had Mitraclip placement, echo in 6/20 showed mean MV gradient 3 mmHg with trivial MR.   Followup in 4 months with echo.      [name] 11/13/2019

## 2019-11-13 NOTE — Patient Instructions (Addendum)
NO medication changes   Labs today We will only contact you if something comes back abnormal or we need to make some changes. Otherwise no news is good news!   Your physician has requested that you have an echocardiogram. Echocardiography is a painless test that uses sound waves to create images of your heart. It provides your doctor with information about the size and shape of your heart and how well your heart's chambers and valves are working. This procedure takes approximately one hour. There are no restrictions for this procedure.   Your physician recommends that you schedule a follow-up appointment in: 4 months with Dr Aundra Dubin and an ECHO    Please call office at [phone] option 2 if you have any questions or concerns.    At the [facility], you and your health needs are our priority. As part of our continuing mission to provide you with exceptional heart care, we have created designated Provider Care Teams. These Care Teams include your primary Cardiologist (physician) and Advanced Practice Providers (APPs- Physician Assistants and Nurse Practitioners) who all work together to provide you with the care you need, when you need it.   You may see any of the following providers on your designated Care Team at your next follow up: Marland Kitchen Dr Glori Bickers . Dr [name] . Darrick Grinder, NP . Lyda Jester, PA . Audry Riles, PharmD   Please be sure to bring in all your medications bottles to every appointment.

## 2019-11-19 ENCOUNTER — Inpatient Hospital Stay (HOSPITAL_BASED_OUTPATIENT_CLINIC_OR_DEPARTMENT_OTHER): Payer: Medicare PPO | Admitting: Hematology & Oncology

## 2019-11-19 ENCOUNTER — Inpatient Hospital Stay: Payer: Medicare PPO | Attending: Hematology & Oncology

## 2019-11-19 ENCOUNTER — Encounter: Payer: Self-pay | Admitting: Hematology & Oncology

## 2019-11-19 ENCOUNTER — Inpatient Hospital Stay: Payer: Medicare PPO

## 2019-11-19 ENCOUNTER — Other Ambulatory Visit: Payer: Self-pay

## 2019-11-19 VITALS — BP 122/64 | HR 67 | Temp 97.1°F | Resp 18 | Wt 150.0 lb

## 2019-11-19 DIAGNOSIS — R350 Frequency of micturition: Secondary | ICD-10-CM

## 2019-11-19 DIAGNOSIS — Z79899 Other long term (current) drug therapy: Secondary | ICD-10-CM | POA: Diagnosis not present

## 2019-11-19 DIAGNOSIS — M818 Other osteoporosis without current pathological fracture: Secondary | ICD-10-CM | POA: Diagnosis not present

## 2019-11-19 DIAGNOSIS — Z170 Estrogen receptor positive status [ER+]: Secondary | ICD-10-CM | POA: Diagnosis not present

## 2019-11-19 DIAGNOSIS — D500 Iron deficiency anemia secondary to blood loss (chronic): Secondary | ICD-10-CM | POA: Diagnosis not present

## 2019-11-19 DIAGNOSIS — C50412 Malignant neoplasm of upper-outer quadrant of left female breast: Secondary | ICD-10-CM | POA: Diagnosis not present

## 2019-11-19 DIAGNOSIS — C50012 Malignant neoplasm of nipple and areola, left female breast: Secondary | ICD-10-CM

## 2019-11-19 DIAGNOSIS — D0510 Intraductal carcinoma in situ of unspecified breast: Secondary | ICD-10-CM

## 2019-11-19 DIAGNOSIS — C50912 Malignant neoplasm of unspecified site of left female breast: Secondary | ICD-10-CM | POA: Diagnosis not present

## 2019-11-19 LAB — CBC WITH DIFFERENTIAL (CANCER CENTER ONLY)
Abs Immature Granulocytes: 0.01 10*3/uL (ref 0.00–0.07)
Basophils Absolute: 0 10*3/uL (ref 0.0–0.1)
Basophils Relative: 1 %
Eosinophils Absolute: 0.1 10*3/uL (ref 0.0–0.5)
Eosinophils Relative: 3 %
HCT: 37.7 % (ref 36.0–46.0)
Hemoglobin: 12 g/dL (ref 12.0–15.0)
Immature Granulocytes: 0 %
Lymphocytes Relative: 18 %
Lymphs Abs: 0.8 10*3/uL (ref 0.7–4.0)
MCH: 31.2 pg (ref 26.0–34.0)
MCHC: 31.8 g/dL (ref 30.0–36.0)
MCV: 97.9 fL (ref 80.0–100.0)
Monocytes Absolute: 0.4 10*3/uL (ref 0.1–1.0)
Monocytes Relative: 10 %
Neutro Abs: 2.9 10*3/uL (ref 1.7–7.7)
Neutrophils Relative %: 68 %
Platelet Count: 173 10*3/uL (ref 150–400)
RBC: 3.85 MIL/uL — ABNORMAL LOW (ref 3.87–5.11)
RDW: 12.2 % (ref 11.5–15.5)
WBC Count: 4.2 10*3/uL (ref 4.0–10.5)
nRBC: 0 % (ref 0.0–0.2)

## 2019-11-19 LAB — CMP (CANCER CENTER ONLY)
ALT: 9 U/L (ref 0–44)
AST: 10 U/L — ABNORMAL LOW (ref 15–41)
Albumin: 4.5 g/dL (ref 3.5–5.0)
Alkaline Phosphatase: 42 U/L (ref 38–126)
Anion gap: 9 (ref 5–15)
BUN: 34 mg/dL — ABNORMAL HIGH (ref 8–23)
CO2: 27 mmol/L (ref 22–32)
Calcium: 9.8 mg/dL (ref 8.9–10.3)
Chloride: 104 mmol/L (ref 98–111)
Creatinine: 1.58 mg/dL — ABNORMAL HIGH (ref 0.44–1.00)
GFR, Est AFR Am: 35 mL/min — ABNORMAL LOW (ref >60)
GFR, Estimated: 30 mL/min — ABNORMAL LOW (ref >60)
Glucose, Bld: 98 mg/dL (ref 70–99)
Potassium: 4 mmol/L (ref 3.5–5.1)
Sodium: 140 mmol/L (ref 135–145)
Total Bilirubin: 0.5 mg/dL (ref 0.3–1.2)
Total Protein: 6.5 g/dL (ref 6.5–8.1)

## 2019-11-19 MED ORDER — DENOSUMAB 60 MG/ML ~~LOC~~ SOSY
60.0000 mg | PREFILLED_SYRINGE | Freq: Once | SUBCUTANEOUS | Status: AC
  Administered 2019-11-19: 60 mg via SUBCUTANEOUS

## 2019-11-19 NOTE — Patient Instructions (Signed)
Denosumab injection What is this medicine? DENOSUMAB (den oh sue mab) slows bone breakdown. Prolia is used to treat osteoporosis in women after menopause and in men, and in people who are taking corticosteroids for 6 months or more. Xgeva is used to treat a high calcium level due to cancer and to prevent bone fractures and other bone problems caused by multiple myeloma or cancer bone metastases. Xgeva is also used to treat giant cell tumor of the bone. This medicine may be used for other purposes; ask your health care provider or pharmacist if you have questions. COMMON BRAND NAME(S): Prolia, XGEVA What should I tell my health care provider before I take this medicine? They need to know if you have any of these conditions:  dental disease  having surgery or tooth extraction  infection  kidney disease  low levels of calcium or Vitamin D in the blood  malnutrition  on hemodialysis  skin conditions or sensitivity  thyroid or parathyroid disease  an unusual reaction to denosumab, other medicines, foods, dyes, or preservatives  pregnant or trying to get pregnant  breast-feeding How should I use this medicine? This medicine is for injection under the skin. It is given by a health care professional in a hospital or clinic setting. A special MedGuide will be given to you before each treatment. Be sure to read this information carefully each time. For Prolia, talk to your pediatrician regarding the use of this medicine in children. Special care may be needed. For Xgeva, talk to your pediatrician regarding the use of this medicine in children. While this drug may be prescribed for children as young as 13 years for selected conditions, precautions do apply. Overdosage: If you think you have taken too much of this medicine contact a poison control center or emergency room at once. NOTE: This medicine is only for you. Do not share this medicine with others. What if I miss a dose? It is  important not to miss your dose. Call your doctor or health care professional if you are unable to keep an appointment. What may interact with this medicine? Do not take this medicine with any of the following medications:  other medicines containing denosumab This medicine may also interact with the following medications:  medicines that lower your chance of fighting infection  steroid medicines like prednisone or cortisone This list may not describe all possible interactions. Give your health care provider a list of all the medicines, herbs, non-prescription drugs, or dietary supplements you use. Also tell them if you smoke, drink alcohol, or use illegal drugs. Some items may interact with your medicine. What should I watch for while using this medicine? Visit your doctor or health care professional for regular checks on your progress. Your doctor or health care professional may order blood tests and other tests to see how you are doing. Call your doctor or health care professional for advice if you get a fever, chills or sore throat, or other symptoms of a cold or flu. Do not treat yourself. This drug may decrease your body's ability to fight infection. Try to avoid being around people who are sick. You should make sure you get enough calcium and vitamin D while you are taking this medicine, unless your doctor tells you not to. Discuss the foods you eat and the vitamins you take with your health care professional. See your dentist regularly. Brush and floss your teeth as directed. Before you have any dental work done, tell your dentist you are   receiving this medicine. Do not become pregnant while taking this medicine or for 5 months after stopping it. Talk with your doctor or health care professional about your birth control options while taking this medicine. Women should inform their doctor if they wish to become pregnant or think they might be pregnant. There is a potential for serious side  effects to an unborn child. Talk to your health care professional or pharmacist for more information. What side effects may I notice from receiving this medicine? Side effects that you should report to your doctor or health care professional as soon as possible:  allergic reactions like skin rash, itching or hives, swelling of the face, lips, or tongue  bone pain  breathing problems  dizziness  jaw pain, especially after dental work  redness, blistering, peeling of the skin  signs and symptoms of infection like fever or chills; cough; sore throat; pain or trouble passing urine  signs of low calcium like fast heartbeat, muscle cramps or muscle pain; pain, tingling, numbness in the hands or feet; seizures  unusual bleeding or bruising  unusually weak or tired Side effects that usually do not require medical attention (report to your doctor or health care professional if they continue or are bothersome):  constipation  diarrhea  headache  joint pain  loss of appetite  muscle pain  runny nose  tiredness  upset stomach This list may not describe all possible side effects. Call your doctor for medical advice about side effects. You may report side effects to FDA at 1-800-FDA-1088. Where should I keep my medicine? This medicine is only given in a clinic, doctor's office, or other health care setting and will not be stored at home. NOTE: This sheet is a summary. It may not cover all possible information. If you have questions about this medicine, talk to your doctor, pharmacist, or health care provider.  2020 Elsevier/Gold Standard (2018-01-27 16:10:44)
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
## 2019-11-20 DIAGNOSIS — M5416 Radiculopathy, lumbar region: Secondary | ICD-10-CM | POA: Diagnosis not present

## 2019-11-20 DIAGNOSIS — M961 Postlaminectomy syndrome, not elsewhere classified: Secondary | ICD-10-CM | POA: Diagnosis not present

## 2019-11-26 ENCOUNTER — Other Ambulatory Visit (HOSPITAL_COMMUNITY): Payer: Self-pay | Admitting: Internal Medicine

## 2019-12-07 ENCOUNTER — Other Ambulatory Visit: Payer: Self-pay

## 2019-12-07 ENCOUNTER — Ambulatory Visit: Payer: Medicare PPO | Attending: Internal Medicine

## 2019-12-07 DIAGNOSIS — Z23 Encounter for immunization: Secondary | ICD-10-CM | POA: Insufficient documentation

## 2019-12-07 NOTE — Progress Notes (Signed)
   Covid-19 Vaccination Clinic  Name:  Kim [name]    MRN: [id number] DOB: 1935-02-10  12/07/2019  [name] was observed post Covid-19 immunization for 15 minutes without incident. She was provided with Vaccine Information Sheet and instruction to access the V-Safe system.   [name] was instructed to call 911 with any severe reactions post vaccine: Marland Kitchen Difficulty breathing  . Swelling of face and throat  . A fast heartbeat  . A bad rash all over body  . Dizziness and weakness   Immunizations Administered    Name Date Dose VIS Date Route   Pfizer COVID-19 Vaccine 12/07/2019  8:39 AM 0.3 mL 09/14/2019 Intramuscular   Manufacturer: [place]   Lot: [id number]   [place]: [id number]

## 2019-12-08 ENCOUNTER — Other Ambulatory Visit: Payer: Self-pay

## 2019-12-08 ENCOUNTER — Telehealth: Payer: Self-pay | Admitting: Physician Assistant

## 2019-12-08 ENCOUNTER — Encounter (HOSPITAL_COMMUNITY): Payer: Self-pay | Admitting: Emergency Medicine

## 2019-12-08 ENCOUNTER — Emergency Department (HOSPITAL_COMMUNITY)
Admission: EM | Admit: 2019-12-08 | Discharge: 2019-12-08 | Disposition: A | Discharge status: Home / Self Care | Payer: Medicare PPO | Attending: Emergency Medicine | Admitting: Emergency Medicine

## 2019-12-08 ENCOUNTER — Emergency Department (HOSPITAL_COMMUNITY): Payer: Medicare PPO

## 2019-12-08 DIAGNOSIS — Z7901 Long term (current) use of anticoagulants: Secondary | ICD-10-CM | POA: Diagnosis not present

## 2019-12-08 DIAGNOSIS — Z20822 Contact with and (suspected) exposure to covid-19: Secondary | ICD-10-CM | POA: Diagnosis not present

## 2019-12-08 DIAGNOSIS — E039 Hypothyroidism, unspecified: Secondary | ICD-10-CM | POA: Insufficient documentation

## 2019-12-08 DIAGNOSIS — Z79899 Other long term (current) drug therapy: Secondary | ICD-10-CM | POA: Insufficient documentation

## 2019-12-08 DIAGNOSIS — H532 Diplopia: Secondary | ICD-10-CM | POA: Diagnosis not present

## 2019-12-08 DIAGNOSIS — I10 Essential (primary) hypertension: Secondary | ICD-10-CM | POA: Diagnosis not present

## 2019-12-08 DIAGNOSIS — R791 Abnormal coagulation profile: Secondary | ICD-10-CM | POA: Diagnosis not present

## 2019-12-08 DIAGNOSIS — I2510 Atherosclerotic heart disease of native coronary artery without angina pectoris: Secondary | ICD-10-CM | POA: Diagnosis not present

## 2019-12-08 DIAGNOSIS — R29818 Other symptoms and signs involving the nervous system: Secondary | ICD-10-CM | POA: Diagnosis not present

## 2019-12-08 DIAGNOSIS — Z96651 Presence of right artificial knee joint: Secondary | ICD-10-CM | POA: Insufficient documentation

## 2019-12-08 LAB — I-STAT CHEM 8, ED
BUN: 23 mg/dL (ref 8–23)
Calcium, Ion: 1.24 mmol/L (ref 1.15–1.40)
Chloride: 106 mmol/L (ref 98–111)
Creatinine, Ser: 1.3 mg/dL — ABNORMAL HIGH (ref 0.44–1.00)
Glucose, Bld: 92 mg/dL (ref 70–99)
HCT: 35 % — ABNORMAL LOW (ref 36.0–46.0)
Hemoglobin: 11.9 g/dL — ABNORMAL LOW (ref 12.0–15.0)
Potassium: 4 mmol/L (ref 3.5–5.1)
Sodium: 139 mmol/L (ref 135–145)
TCO2: 23 mmol/L (ref 22–32)

## 2019-12-08 LAB — COMPREHENSIVE METABOLIC PANEL
ALT: 10 U/L (ref 0–44)
AST: 13 U/L — ABNORMAL LOW (ref 15–41)
Albumin: 3.8 g/dL (ref 3.5–5.0)
Alkaline Phosphatase: 37 U/L — ABNORMAL LOW (ref 38–126)
Anion gap: 9 (ref 5–15)
BUN: 22 mg/dL (ref 8–23)
CO2: 22 mmol/L (ref 22–32)
Calcium: 9.2 mg/dL (ref 8.9–10.3)
Chloride: 109 mmol/L (ref 98–111)
Creatinine, Ser: 1.27 mg/dL — ABNORMAL HIGH (ref 0.44–1.00)
GFR calc Af Amer: 45 mL/min — ABNORMAL LOW (ref >60)
GFR calc non Af Amer: 39 mL/min — ABNORMAL LOW (ref >60)
Glucose, Bld: 97 mg/dL (ref 70–99)
Potassium: 4.1 mmol/L (ref 3.5–5.1)
Sodium: 140 mmol/L (ref 135–145)
Total Bilirubin: 0.6 mg/dL (ref 0.3–1.2)
Total Protein: 6.1 g/dL — ABNORMAL LOW (ref 6.5–8.1)

## 2019-12-08 LAB — URINALYSIS, ROUTINE W REFLEX MICROSCOPIC
Bilirubin Urine: NEGATIVE
Glucose, UA: NEGATIVE mg/dL
Ketones, ur: NEGATIVE mg/dL
Nitrite: POSITIVE — AB
Protein, ur: NEGATIVE mg/dL
Specific Gravity, Urine: 1.018 (ref 1.005–1.030)
WBC, UA: >50 WBC/hpf — ABNORMAL HIGH (ref 0–5)
pH: 5 (ref 5.0–8.0)

## 2019-12-08 LAB — RESPIRATORY PANEL BY RT PCR (FLU A&B, COVID)
Influenza A by PCR: NEGATIVE
Influenza B by PCR: NEGATIVE
SARS Coronavirus 2 by RT PCR: NEGATIVE

## 2019-12-08 LAB — RAPID URINE DRUG SCREEN, HOSP PERFORMED
Amphetamines: NOT DETECTED
Barbiturates: NOT DETECTED
Benzodiazepines: NOT DETECTED
Cocaine: NOT DETECTED
Opiates: NOT DETECTED
Tetrahydrocannabinol: NOT DETECTED

## 2019-12-08 LAB — ETHANOL: Alcohol, Ethyl (B): <10 mg/dL (ref <10)

## 2019-12-08 LAB — PROTIME-INR
INR: 1.2 (ref 0.8–1.2)
Prothrombin Time: 15.1 seconds (ref 11.4–15.2)

## 2019-12-08 LAB — DIFFERENTIAL
Abs Immature Granulocytes: 0.01 10*3/uL (ref 0.00–0.07)
Basophils Absolute: 0 10*3/uL (ref 0.0–0.1)
Basophils Relative: 1 %
Eosinophils Absolute: 0.1 10*3/uL (ref 0.0–0.5)
Eosinophils Relative: 2 %
Immature Granulocytes: 0 %
Lymphocytes Relative: 20 %
Lymphs Abs: 0.9 10*3/uL (ref 0.7–4.0)
Monocytes Absolute: 0.5 10*3/uL (ref 0.1–1.0)
Monocytes Relative: 11 %
Neutro Abs: 3 10*3/uL (ref 1.7–7.7)
Neutrophils Relative %: 66 %

## 2019-12-08 LAB — CBC
HCT: 36 % (ref 36.0–46.0)
Hemoglobin: 11.4 g/dL — ABNORMAL LOW (ref 12.0–15.0)
MCH: 31.1 pg (ref 26.0–34.0)
MCHC: 31.7 g/dL (ref 30.0–36.0)
MCV: 98.4 fL (ref 80.0–100.0)
Platelets: 149 10*3/uL — ABNORMAL LOW (ref 150–400)
RBC: 3.66 MIL/uL — ABNORMAL LOW (ref 3.87–5.11)
RDW: 12.2 % (ref 11.5–15.5)
WBC: 4.4 10*3/uL (ref 4.0–10.5)
nRBC: 0 % (ref 0.0–0.2)

## 2019-12-08 LAB — CBG MONITORING, ED: Glucose-Capillary: 85 mg/dL (ref 70–99)

## 2019-12-08 LAB — APTT: aPTT: 32 seconds (ref 24–36)

## 2019-12-08 NOTE — ED Provider Notes (Signed)
[facility] EMERGENCY DEPARTMENT Provider Note   CSN: [id number] Arrival date & time: 12/08/19  1256  An emergency department physician performed an initial assessment on this suspected stroke patient at 1332.  History Chief Complaint  Patient presents with  . Diplopia    Kim [name] is a 85 y.o. female.  HPI She presents for evaluation of "double vision," and inability to walk without a assistive device, since she awoke at 5 AM.  Symptoms persistent, without worsening or improvement.  No prior similar problems.  She reports that she has "a little headache."  She denies nausea, vomiting, cough, shortness of breath, fever, chills, focal weakness or paresthesia.  No recent illnesses.  She came here by private vehicle.  She states that she had her second Covid vaccine, yesterday.  There are no other known modifying factors.    Past Medical History:  Diagnosis Date  . Allergic rhinitis   . Allergy   . Arthritis    hands  . Breast cancer of upper-outer quadrant of left female breast ([place]) 11/08/13   finished chemo and radiation 2015  . CAD (coronary artery disease)    a. 12/2013 Cath/PCI: LM nl, LAD 20p, 29m, 30d, D1 30p, LCX 20p, 71m, OM1 20, Mo2 40p, RCA 30p, 73m (4.0x16 Promus Premier DES), 20d, RPL 30, RPDA 70p, 77m, EF 65-70%.  . Diverticulosis   . HOH (hard of hearing)    bilateral, wears hearing aids  . Hx of adenomatous colonic polyps 02/1999  . Hyperlipidemia   . Hypertension   . Hypertrophic obstructive cardiomyopathy (HOCM) [facility])    Cardiologist is Dr. [name]  . Hypothyroidism   . Iron deficiency anemia due to chronic blood loss 01/07/2016  . Low back pain    gets ESI from Dr. Rennis Harding  . Malabsorption of iron 01/07/2016  . Myocardial infarction ([place]) 12/2013   very mild-with first chemo -placed stent x1  . Persistent atrial fibrillation with rapid ventricular response ([place]) 10/14/2018  . Personal history of radiation therapy 2015  . S/P  radiation therapy 05/14/2014-06/26/2014   1) Left Breast  / 50 Gy in 25 fractions, 2) Left Supraclavicular fossa/ 47.5 Gy in 25 fractions, 3) Left Posterior Axillary boost / 4.825 Gy in 25 fractions, 4) Left Breast boost / 10 Gy in 5 fractions   . Tubulovillous adenoma of colon 12/2009   with HGD  . Wears hearing aid    left and right     Patient Active Problem List   Diagnosis Date Noted  . Severe mitral regurgitation 12/06/2018  . Persistent atrial fibrillation with rapid ventricular response ([place]) 10/14/2018  . HNP (herniated nucleus pulposus), lumbar 03/01/2018  . History of total knee replacement, right 11/13/2017  . OA (osteoarthritis) of knee 10/24/2017  . Chronic pain of right knee 06/17/2017  . Osteoporosis 07/30/2016  . Iron deficiency anemia due to chronic blood loss 01/07/2016  . Malabsorption of iron 01/07/2016  . Postoperative state 01/17/2015  . HOCM (hypertrophic obstructive cardiomyopathy) ([place]) 01/09/2014  . Breast cancer ([place]) 01/09/2014  . Coronary atherosclerosis of native coronary artery 12/20/2013  . CAD (coronary artery disease)   . Hyperlipidemia   . Breast cancer of upper-outer quadrant of left female breast ([place]) 11/05/2013  . DCIS (ductal carcinoma in situ) of breast 10/26/2013  . Hypertension 04/05/2012  . UNSPECIFIED HEARING LOSS 08/07/2010  . LOW BACK PAIN 04/22/2009  . CONSTIPATION 11/04/2008  . COLONIC POLYPS, HX OF 11/01/2008  . ALLERGIC RHINITIS 06/27/2007  .  Hypothyroidism 06/22/2007    Past Surgical History:  Procedure Laterality Date  . ABDOMINAL HYSTERECTOMY    . ANTERIOR AND POSTERIOR REPAIR N/A 01/17/2015   Procedure: CYSTOCELE REPAIR ;  Surgeon: Princess Bruins, MD;  Location: [facility];  Service: Gynecology;  Laterality: N/A;  . BILATERAL SALPINGOOPHORECTOMY     see Laurin Coder NP for GYN exams  . BREAST BIOPSY Left 10/18/2013  . BREAST BIOPSY Left 10/31/2013  . BREAST LUMPECTOMY Left 11/05/2013  . BREAST LUMPECTOMY WITH NEEDLE  LOCALIZATION AND AXILLARY LYMPH NODE DISSECTION Left 11/05/2013   Procedure: LEFT BREAST LUMPECTOMY WITH NEEDLE LOCALIZATION and axillary lymph Node Dissection;  Surgeon: Edward Jolly, MD;  Location: [facility];  Service: General;  Laterality: Left;  . BREAST SURGERY  11/2013   left lumpectomy  . CARDIOVERSION N/A 10/17/2018   Procedure: CARDIOVERSION;  Surgeon: Larey Dresser, MD;  Location: [facility];  Service: Cardiovascular;  Laterality: N/A;  . CARDIOVERSION N/A 11/08/2018   Procedure: CARDIOVERSION;  Surgeon: Larey Dresser, MD;  Location: [facility];  Service: Cardiovascular;  Laterality: N/A;  . CATARACT EXTRACTION W/ INTRAOCULAR LENS  IMPLANT, BILATERAL  2012   bilateral  . COLONOSCOPY  11/04/2015   per Dr. Fuller Plan, adenomatous polyps, no repeats planned   . EYE SURGERY  11/22/2006   cataracts, bilateral, intraocular lens implant  . JOINT REPLACEMENT     2019  . LEFT HEART CATHETERIZATION WITH CORONARY ANGIOGRAM N/A 12/19/2013   Procedure: LEFT HEART CATHETERIZATION WITH CORONARY ANGIOGRAM;  Surgeon: Burnell Blanks, MD;  Location: [facility] CATH LAB;  Service: Cardiovascular;  Laterality: N/A;  . lumbar epidural steroid injection     lumber spinal stenosis  . LUMBAR LAMINECTOMY/DECOMPRESSION MICRODISCECTOMY Right 03/01/2018   Procedure: RIGHT LUMBAR FOUR- LUMBAR FIVE LAMINECTOMY/MICRODISCECTOMY;  Surgeon: [name], MD;  Location: [facility];  Service: Neurosurgery;  Laterality: Right;  . MITRAL VALVE REPAIR N/A 12/06/2018   Procedure: MITRAL VALVE REPAIR;  Surgeon: Sherren Mocha, MD;  Location: [facility] CV LAB;  Service: Cardiovascular;  Laterality: N/A;  . POLYPECTOMY    . PORTACATH PLACEMENT Right 12/11/2013   Procedure: INSERTION PORT-A-CATH;  Surgeon: Edward Jolly, MD;  Location: [facility];  Service: General;  Laterality: Right;  Subclavian Vein;   . RIGHT/LEFT HEART CATH AND CORONARY ANGIOGRAPHY N/A 11/03/2018   Procedure:  RIGHT/LEFT HEART CATH AND CORONARY ANGIOGRAPHY;  Surgeon: Larey Dresser, MD;  Location: [facility] CV LAB;  Service: Cardiovascular;  Laterality: N/A;  . TEE WITHOUT CARDIOVERSION N/A 10/17/2018   Procedure: TRANSESOPHAGEAL ECHOCARDIOGRAM (TEE);  Surgeon: Larey Dresser, MD;  Location: [facility] ENDOSCOPY;  Service: Cardiovascular;  Laterality: N/A;  . TEE WITHOUT CARDIOVERSION N/A 11/08/2018   Procedure: TRANSESOPHAGEAL ECHOCARDIOGRAM (TEE);  Surgeon: Larey Dresser, MD;  Location: [facility] ENDOSCOPY;  Service: Cardiovascular;  Laterality: N/A;  . TOTAL KNEE ARTHROPLASTY Right 10/24/2017   Procedure: TOTAL RIGHT KNEE ARTHROPLASTY;  Surgeon: Gaynelle Arabian, MD;  Location: [facility];  Service: Orthopedics;  Laterality: Right;     OB History    Gravida  2   Para  2   Term      Preterm      AB      Living        SAB      TAB      Ectopic      Multiple      Live Births           Obstetric Comments  Menarche age [age],  parity age 35, G24,P2,  No BC, HRT x 20 years        Family History  Problem Relation Age of Onset  . Alcohol abuse Father   . Kidney failure Mother   . Colon cancer Neg Hx   . Rectal cancer Neg Hx   . Stomach cancer Neg Hx   . Colon polyps Neg Hx     Social History   Tobacco Use  . Smoking status: Never Smoker  . Smokeless tobacco: Never Used  . Tobacco comment: never used tobacco  Substance Use Topics  . Alcohol use: No    Alcohol/week: 0.0 standard drinks  . Drug use: No    Home Medications Prior to Admission medications   Medication Sig Start Date End Date Taking? Authorizing Provider  apixaban (ELIQUIS) 5 MG TABS tablet Take 5 mg by mouth 2 (two) times daily.   Yes [provider]  Calcium Carbonate-Vitamin D (CALCIUM-D PO) Take 1 tablet by mouth daily.   Yes [provider]  diltiazem (CARDIZEM CD) 120 MG 24 hr capsule TAKE 1 CAPSULE BY MOUTH ONCE DAILY Patient taking differently: Take 120 mg by mouth daily.  10/29/19  Yes  Larey Dresser, MD  exemestane (AROMASIN) 25 MG tablet Take 25 mg by mouth daily after breakfast.   Yes [provider]  ezetimibe (ZETIA) 10 MG tablet TAKE 1 TABLET BY MOUTH ONCE DAILY Patient taking differently: Take 10 mg by mouth daily.  10/29/19  Yes Larey Dresser, MD  furosemide (LASIX) 40 MG tablet Take 1 tablet (40 mg total) by mouth as needed. For weight 144 or greater 02/19/19  Yes Georgiana Shore, NP  levothyroxine (SYNTHROID, LEVOTHROID) 137 MCG tablet Take 1 tablet (137 mcg total) by mouth daily before breakfast. 08/08/18  Yes Laurey Morale, MD  metoprolol succinate (TOPROL-XL) 50 MG 24 hr tablet TAKE 1 TABLET BY MOUTH ONCE DAILY WITH OR IMMEDIATELY FOLLOWING A MEAL Patient taking differently: Take 50 mg by mouth daily.  11/26/19  Yes Bensimhon, Shaune Pascal, MD  Multiple Vitamin (MULTIVITAMIN WITH MINERALS) TABS tablet Take 1 tablet by mouth daily.   Yes [provider]  potassium chloride (K-DUR) 10 MEQ tablet Take 2 tablets (20 mEq total) by mouth as needed. Only take 20 meq if you take lasix Patient not taking: Reported on 12/08/2019 01/15/19   Darrick Grinder D, NP  ferrous sulfate 325 (65 FE) MG tablet Take 325 mg by mouth 2 (two) times daily.    01/16/19  [provider]  pravastatin (PRAVACHOL) 80 MG tablet Take 1 tablet (80 mg total) by mouth every evening. 11/20/15 01/16/19  Larey Dresser, MD    Allergies    Penicillins, Propoxyphene n-acetaminophen, Evolocumab, Levaquin [levofloxacin], Statins, Cortisone, Myrbetriq [mirabegron], and Pseudoephedrine  Review of Systems   Review of Systems  All other systems reviewed and are negative.   Physical Exam Updated Vital Signs BP (!) 145/67   Pulse (!) 57   Temp 98.8 F (37.1 C) (Oral)   Resp 17   LMP 10/19/1991   SpO2 99%   Physical Exam Vitals and nursing note reviewed.  Constitutional:      Appearance: She is well-developed.  HENT:     Head: Normocephalic and atraumatic.  Eyes:      Conjunctiva/sclera: Conjunctivae normal.     Pupils: Pupils are equal, round, and reactive to light.  Neck:     Trachea: Phonation normal.  Cardiovascular:     Rate and Rhythm: Normal  rate and regular rhythm.  Pulmonary:     Effort: Pulmonary effort is normal.     Breath sounds: Normal breath sounds.  Chest:     Chest wall: No tenderness.  Abdominal:     General: There is no distension.     Palpations: Abdomen is soft.     Tenderness: There is no abdominal tenderness. There is no guarding.  Musculoskeletal:        General: Normal range of motion.     Cervical back: Normal range of motion and neck supple.  Skin:    General: Skin is warm and dry.  Neurological:     Mental Status: She is alert and oriented to person, place, and time.     Motor: No abnormal muscle tone.     Comments: No dysarthria or aphasia.  No nystagmus.  No pronator drift.  No ataxia.  Normal strength, arms and legs bilaterally.  Psychiatric:        Mood and Affect: Mood normal.        Behavior: Behavior normal.        Thought Content: Thought content normal.        Judgment: Judgment normal.     ED Results / Procedures / Treatments   Labs (all labs ordered are listed, but only abnormal results are displayed) Labs Reviewed  CBC - Abnormal; Notable for the following components:      Result Value   RBC 3.66 (*)    Hemoglobin 11.4 (*)    Platelets 149 (*)    All other components within normal limits  COMPREHENSIVE METABOLIC PANEL - Abnormal; Notable for the following components:   Creatinine, Ser 1.27 (*)    Total Protein 6.1 (*)    AST 13 (*)    Alkaline Phosphatase 37 (*)    GFR calc non Af Amer 39 (*)    GFR calc Af Amer 45 (*)    All other components within normal limits  URINALYSIS, ROUTINE W REFLEX MICROSCOPIC - Abnormal; Notable for the following components:   APPearance HAZY (*)    Hgb urine dipstick SMALL (*)    Nitrite POSITIVE (*)    Leukocytes,Ua LARGE (*)    WBC, UA >50 (*)     Bacteria, UA RARE (*)    All other components within normal limits  I-STAT CHEM 8, ED - Abnormal; Notable for the following components:   Creatinine, Ser 1.30 (*)    Hemoglobin 11.9 (*)    HCT 35.0 (*)    All other components within normal limits  RESPIRATORY PANEL BY RT PCR (FLU A&B, COVID)  ETHANOL  PROTIME-INR  APTT  DIFFERENTIAL  RAPID URINE DRUG SCREEN, HOSP PERFORMED  CBG MONITORING, ED    EKG EKG Interpretation  Date/Time:  Saturday December 08 2019 13:26:03 EST Ventricular Rate:  63 PR Interval:    QRS Duration: 105 QT Interval:  444 QTC Calculation: 455 R Axis:   100 Text Interpretation: Sinus rhythm Prolonged PR interval Anterior infarct, old Repol abnrm suggests ischemia, lateral leads since last tracing no significant change Confirmed by Daleen Bo [phone]) on 12/08/2019 1:30:25 PM   Radiology MR BRAIN WO CONTRAST  Result Date: 12/08/2019 CLINICAL DATA:  Diplopia. EXAM: MRI HEAD WITHOUT CONTRAST TECHNIQUE: Multiplanar, multiecho pulse sequences of the brain and surrounding structures were obtained without intravenous contrast. COMPARISON:  Noncontrast head CT performed earlier the same day 12/08/2019 FINDINGS: Brain: There is no evidence of acute infarct. No evidence of intracranial mass. No midline shift  or extra-axial fluid collection. No chronic intracranial blood products. Advanced patchy and confluent T2/FLAIR hyperintensity within the cerebral white matter is nonspecific, but consistent with chronic small vessel ischemic disease. There are multiple chronic lacunar infarcts within the basal ganglia, thalami and cerebellar hemispheres bilaterally. Chronic ischemic changes are also present within the pons. Moderate generalized parenchymal atrophy. Vascular: Flow voids maintained within the proximal large arterial vessels. Skull and upper cervical spine: No focal marrow lesion Sinuses/Orbits: Bilateral lens replacements. Minimal ethmoid and left maxillary sinus mucosal  thickening. No significant mastoid effusion. IMPRESSION: No evidence of acute intracranial abnormality, including acute infarction. Advanced chronic small vessel ischemic disease with multiple chronic lacunar infarcts. Moderate generalized parenchymal atrophy. Electronically Signed   By: Kellie Simmering DO   On: 12/08/2019 17:02   CT HEAD CODE STROKE WO CONTRAST  Result Date: 12/08/2019 CLINICAL DATA:  Code stroke.  Diplopia.  Additional provided: EXAM: CT HEAD WITHOUT CONTRAST TECHNIQUE: Contiguous axial images were obtained from the base of the skull through the vertex without intravenous contrast. COMPARISON:  No pertinent prior studies available for comparison. FINDINGS: Brain: There is no evidence of acute intracranial hemorrhage, intracranial mass, midline shift or extra-axial fluid collection.No demarcated cortical infarction. Age-indeterminate although favored chronic lacunar infarct within the left cerebellum. Moderate to advanced ill-defined hypoattenuation within the cerebral white matter is nonspecific, but consistent with chronic small vessel ischemic disease. Moderate generalized parenchymal atrophy. Vascular: No hyperdense vessel.  Atherosclerotic calcifications. Skull: Normal. Negative for fracture or focal lesion. Sinuses/Orbits: Visualized orbits demonstrate no acute abnormality. Minimal scattered paranasal sinus mucosal thickening. No significant mastoid effusion. These results were called by telephone at the time of interpretation on 12/08/2019 at 1:57 pm to provider Dr. Lorraine Lax, who verbally acknowledged these results. IMPRESSION: 1. Age-indeterminate although favored chronic small lacunar infarct within the left cerebellum. 2. Otherwise, no evidence of acute intracranial abnormality. 3. Moderate generalized parenchymal atrophy with moderate to advanced chronic small vessel ischemic disease. Electronically Signed   By: Kellie Simmering DO   On: 12/08/2019 13:58    Procedures Procedures (including  critical care time)  Medications Ordered in ED Medications - No data to display  ED Course  I have reviewed the triage vital signs and the nursing notes.  Pertinent labs & imaging results that were available during my care of the patient were reviewed by me and considered in my medical decision making (see chart for details).  Clinical Course as of Dec 07 1713  Sat Dec 08, 2019  1404 Per radiology, no definite acute abnormality.  Possible cerebellar infarct, age-indeterminate.  CT HEAD CODE STROKE WO CONTRAST [EW]  1405 Normal except elevated creatinine, and low hemoglobin  I-stat chem 8, ED(!) [EW]  1405 Normal except hemoglobin low   [EW]    Clinical Course User Index [EW] Daleen Bo, MD   MDM Rules/Calculators/A&P                        Patient Vitals for the past 24 hrs:  BP Temp Temp src Pulse Resp SpO2  12/08/19 1600 (!) 145/67 -- -- (!) 57 17 99 %  12/08/19 1530 138/83 -- -- (!) 54 19 96 %  12/08/19 1430 (!) 150/75 -- -- (!) 56 16 99 %  12/08/19 1404 137/90 -- -- (!) 58 20 96 %  12/08/19 1334 135/82 98.8 F (37.1 C) Oral 61 14 97 %  12/08/19 1330 135/82 -- -- 60 14 97 %  12/08/19 1300 (!) 154/95 98.2  F (36.8 C) Oral 74 16 99 %    5:15 PM Reevaluation with update and discussion. After initial assessment and treatment, an updated evaluation reveals she states that her double vision is better, but not resolved.Daleen Bo   Medical Decision Making: Patient presenting with diplopia, evaluation in the ED for stroke was negative.  Patient improved after period of observation.  She was seen by the neuro hospitalist was comfortable with outpatient management with a no stroke finding on head MRI.  CALEIGHA BRAUTIGAM was evaluated in Emergency Department on 12/08/2019 for the symptoms described in the history of present illness. She was evaluated in the context of the global COVID-19 pandemic, which necessitated consideration that the patient might be at risk for infection  with the SARS-CoV-2 virus that causes COVID-19. Institutional protocols and algorithms that pertain to the evaluation of patients at risk for COVID-19 are in a state of rapid change based on information released by regulatory bodies including the CDC and federal and state organizations. These policies and algorithms were followed during the patient's care in the ED.   CRITICAL CARE-no Performed by: Daleen Bo   Nursing Notes Reviewed/ Care Coordinated Applicable Imaging Reviewed Interpretation of Laboratory Data incorporated into ED treatment  The patient appears reasonably screened and/or stabilized for discharge and I doubt any other medical condition or other [facility] requiring further screening, evaluation, or treatment in the ED at this time prior to discharge.  Plan: Home Medications-continue usual; Home Treatments-rest, fluids; return here if the recommended treatment, does not improve the symptoms; Recommended follow up-PCP follow-up if not improving in 2 to 4 days.      Final Clinical Impression(s) / ED Diagnoses Final diagnoses:  Diplopia    Rx / DC Orders ED Discharge Orders    None       Daleen Bo, MD 12/08/19 1715

## 2019-12-08 NOTE — Consult Note (Addendum)
NEURO HOSPITALIST  CONSULT   Requesting Physician: Dr. Eulis Foster    Chief Complaint: double vision   History obtained from:  Patient    HPI:                                                                                                                                         Kim [name] is an 85 y.o. female  With PMH, hypertropic obstrucive cardiomyopathy, HTN, HLD, CAD a fib (on eliquis), MI (12/2013) who prsented to [facility] ED with c/o double vision. Code stroke initiated in ED and ;ater canceled.    Patient went to bed about 12 am and she was of normal health. She woke up this morning at 5 am and noted that she had double vision. She received her COVID -19 vaccine dose 2 yesterday and thought nothing of it and went back to bed. When she re-awoke at 0700 she still had the same complaints. Husband brought her to the ED. Patient is on eliquis.  ED course:  CTH: negative for hemorrhage; small lacunar infarct left cerebellum BP: 137/70 BG: 85   LSN: 12/08/19 0000 tPA Given: no, outside window and on eliquis Modified Rankin: Rankin Score=0  NIHSS:0     Past Medical History:  Diagnosis Date  . Allergic rhinitis   . Allergy   . Arthritis    hands  . Breast cancer of upper-outer quadrant of left female breast ([place]) 11/08/13   finished chemo and radiation 2015  . CAD (coronary artery disease)    a. 12/2013 Cath/PCI: LM nl, LAD 20p, 53m, 30d, D1 30p, LCX 20p, 55m, OM1 20, Mo2 40p, RCA 30p, 71m (4.0x16 Promus Premier DES), 20d, RPL 30, RPDA 70p, 56m, EF 65-70%.  . Diverticulosis   . HOH (hard of hearing)    bilateral, wears hearing aids  . Hx of adenomatous colonic polyps 02/1999  . Hyperlipidemia   . Hypertension   . Hypertrophic obstructive cardiomyopathy (HOCM) [facility])    Cardiologist is Dr. [name]  . Hypothyroidism   . Iron deficiency anemia due to chronic blood loss 01/07/2016  . Low back pain    gets ESI from Dr. Rennis Harding  .  Malabsorption of iron 01/07/2016  . Myocardial infarction ([place]) 12/2013   very mild-with first chemo -placed stent x1  . Persistent atrial fibrillation with rapid ventricular response ([place]) 10/14/2018  . Personal history of radiation therapy 2015  . S/P radiation therapy 05/14/2014-06/26/2014   1) Left Breast  / 50 Gy in 25 fractions, 2) Left Supraclavicular fossa/ 47.5 Gy in 25 fractions, 3) Left Posterior Axillary boost / 4.825 Gy in 25  fractions, 4) Left Breast boost / 10 Gy in 5 fractions   . Tubulovillous adenoma of colon 12/2009   with HGD  . Wears hearing aid    left and right     Past Surgical History:  Procedure Laterality Date  . ABDOMINAL HYSTERECTOMY    . ANTERIOR AND POSTERIOR REPAIR N/A 01/17/2015   Procedure: CYSTOCELE REPAIR ;  Surgeon: Princess Bruins, MD;  Location: [facility];  Service: Gynecology;  Laterality: N/A;  . BILATERAL SALPINGOOPHORECTOMY     see Laurin Coder NP for GYN exams  . BREAST BIOPSY Left 10/18/2013  . BREAST BIOPSY Left 10/31/2013  . BREAST LUMPECTOMY Left 11/05/2013  . BREAST LUMPECTOMY WITH NEEDLE LOCALIZATION AND AXILLARY LYMPH NODE DISSECTION Left 11/05/2013   Procedure: LEFT BREAST LUMPECTOMY WITH NEEDLE LOCALIZATION and axillary lymph Node Dissection;  Surgeon: Edward Jolly, MD;  Location: [place];  Service: General;  Laterality: Left;  . BREAST SURGERY  11/2013   left lumpectomy  . CARDIOVERSION N/A 10/17/2018   Procedure: CARDIOVERSION;  Surgeon: Larey Dresser, MD;  Location: [facility] ENDOSCOPY;  Service: Cardiovascular;  Laterality: N/A;  . CARDIOVERSION N/A 11/08/2018   Procedure: CARDIOVERSION;  Surgeon: Larey Dresser, MD;  Location: [place];  Service: Cardiovascular;  Laterality: N/A;  . CATARACT EXTRACTION W/ INTRAOCULAR LENS  IMPLANT, BILATERAL  2012   bilateral  . COLONOSCOPY  11/04/2015   per Dr. Fuller Plan, adenomatous polyps, no repeats planned   . EYE SURGERY  11/22/2006   cataracts, bilateral, intraocular lens implant   . JOINT REPLACEMENT     2019  . LEFT HEART CATHETERIZATION WITH CORONARY ANGIOGRAM N/A 12/19/2013   Procedure: LEFT HEART CATHETERIZATION WITH CORONARY ANGIOGRAM;  Surgeon: Burnell Blanks, MD;  Location: [facility] CATH LAB;  Service: Cardiovascular;  Laterality: N/A;  . lumbar epidural steroid injection     lumber spinal stenosis  . LUMBAR LAMINECTOMY/DECOMPRESSION MICRODISCECTOMY Right 03/01/2018   Procedure: RIGHT LUMBAR FOUR- LUMBAR FIVE LAMINECTOMY/MICRODISCECTOMY;  Surgeon: [name], MD;  Location: [place];  Service: Neurosurgery;  Laterality: Right;  . MITRAL VALVE REPAIR N/A 12/06/2018   Procedure: MITRAL VALVE REPAIR;  Surgeon: Sherren Mocha, MD;  Location: [place] CV LAB;  Service: Cardiovascular;  Laterality: N/A;  . POLYPECTOMY    . PORTACATH PLACEMENT Right 12/11/2013   Procedure: INSERTION PORT-A-CATH;  Surgeon: Edward Jolly, MD;  Location: [place];  Service: General;  Laterality: Right;  Subclavian Vein;   . RIGHT/LEFT HEART CATH AND CORONARY ANGIOGRAPHY N/A 11/03/2018   Procedure: RIGHT/LEFT HEART CATH AND CORONARY ANGIOGRAPHY;  Surgeon: Larey Dresser, MD;  Location: [place] CV LAB;  Service: Cardiovascular;  Laterality: N/A;  . TEE WITHOUT CARDIOVERSION N/A 10/17/2018   Procedure: TRANSESOPHAGEAL ECHOCARDIOGRAM (TEE);  Surgeon: Larey Dresser, MD;  Location: [facility] ENDOSCOPY;  Service: Cardiovascular;  Laterality: N/A;  . TEE WITHOUT CARDIOVERSION N/A 11/08/2018   Procedure: TRANSESOPHAGEAL ECHOCARDIOGRAM (TEE);  Surgeon: Larey Dresser, MD;  Location: [facility] ENDOSCOPY;  Service: Cardiovascular;  Laterality: N/A;  . TOTAL KNEE ARTHROPLASTY Right 10/24/2017   Procedure: TOTAL RIGHT KNEE ARTHROPLASTY;  Surgeon: Gaynelle Arabian, MD;  Location: [facility];  Service: Orthopedics;  Laterality: Right;    Family History  Problem Relation Age of Onset  . Alcohol abuse Father   . Kidney failure Mother   . Colon cancer Neg Hx   . Rectal cancer Neg Hx   .  Stomach cancer Neg Hx   . Colon polyps Neg Hx  Social History:  reports that she has never smoked. She has never used smokeless tobacco. She reports that she does not drink alcohol or use drugs.  Allergies:  Allergies  Allergen Reactions  . Penicillins Anaphylaxis and Swelling    FACIAL SWELLING PATIENT HAS HAD A PCN REACTION WITH IMMEDIATE RASH, FACIAL/TONGUE/THROAT SWELLING, SOB, OR LIGHTHEADEDNESS WITH HYPOTENSION:  #  #  YES  #  #  Has patient had a PCN reaction causing severe rash involving mucus membranes or skin necrosis: No Has patient had a PCN reaction that required hospitalization: No Has patient had a PCN reaction occurring within the last 10 years: No  . Propoxyphene N-Acetaminophen Swelling    tongue swelling  . Evolocumab Other (See Comments)    Myalgias, fatigue, some itching and burning on her feet,  Arthralgia (Joint Pain)  . Levaquin [Levofloxacin] Hives  . Statins Other (See Comments)    Joint pain  . Cortisone Rash  . Myrbetriq [Mirabegron] Rash    Rash on face  . Pseudoephedrine Other (See Comments)    Facial flushing    Medications:                                                                                                                           No current facility-administered medications for this encounter.   Current Outpatient Medications  Medication Sig Dispense Refill  . apixaban (ELIQUIS) 5 MG TABS tablet Take 5 mg by mouth 2 (two) times daily.    . Calcium Carbonate-Vitamin D (CALCIUM-D PO) Take 1 tablet by mouth daily.    Marland Kitchen diltiazem (CARDIZEM CD) 120 MG 24 hr capsule TAKE 1 CAPSULE BY MOUTH ONCE DAILY 90 capsule 0  . exemestane (AROMASIN) 25 MG tablet Take 25 mg by mouth daily after breakfast.    . ezetimibe (ZETIA) 10 MG tablet TAKE 1 TABLET BY MOUTH ONCE DAILY 90 tablet 0  . furosemide (LASIX) 40 MG tablet Take 1 tablet (40 mg total) by mouth as needed. For weight 144 or greater 30 tablet 5  . levothyroxine (SYNTHROID, LEVOTHROID)  137 MCG tablet Take 1 tablet (137 mcg total) by mouth daily before breakfast. 90 tablet 3  . metoprolol succinate (TOPROL-XL) 50 MG 24 hr tablet TAKE 1 TABLET BY MOUTH ONCE DAILY WITH OR IMMEDIATELY FOLLOWING A MEAL 30 tablet 1  . Multiple Vitamin (MULTIVITAMIN WITH MINERALS) TABS tablet Take 1 tablet by mouth daily.    . potassium chloride (K-DUR) 10 MEQ tablet Take 2 tablets (20 mEq total) by mouth as needed. Only take 20 meq if you take lasix 60 tablet 5     ROS:  ROS was performed and is negative except as noted in HPI   General Examination:                                                                                                      Blood pressure 137/90, pulse (!) 58, temperature 98.8 F (37.1 C), temperature source Oral, resp. rate 20, last menstrual period 10/19/1991, SpO2 96 %.  Physical Exam  Constitutional: Appears well-developed and well-nourished.  Psych: Affect appropriate to situation Eyes: Normal external eye and conjunctiva. HENT: Normocephalic, no lesions, without obvious abnormality.   Musculoskeletal-no joint tenderness, deformity or swelling Cardiovascular: Normal rate and regular rhythm.  Respiratory: Effort normal, non-labored breathing saturations WNL GI: Soft.  No distension. There is no tenderness.  Skin: WDI  Neurological Examination Mental Status: Alert, oriented, thought content appropriate.  Speech fluent without evidence of aphasia.  Able to follow commands without difficulty. Cranial Nerves: II: Visual fields grossly normal,  III,IV, VI: ptosis not present, extra-ocular motions intact, no nystagmus, bilaterally, pupils equal, round, reactive to light and accommodation. Cover uncover test negative. V,VII: smile symmetric, facial light touch sensation normal bilaterally VIII: severely hard of hearing; has bilateral  heading aids IX,X: uvula rises midline XI: bilateral shoulder shrug XII: midline tongue extension Motor: Right : Upper extremity   5/5 Left:     Upper extremity   5/5  Lower extremity   5/5  Lower extremity   5/5 Tone and bulk:normal tone throughout; no atrophy noted Sensory:  light touch intact throughout, bilaterally Deep Tendon Reflexes: 2+ and symmetric biceps and patella Plantars: Right: downgoing   Left: downgoing Cerebellar: No ataxia Gait:    Lab Results: Basic Metabolic Panel: Recent Labs  Lab 12/08/19 1332 12/08/19 1340  NA 140 139  K 4.1 4.0  CL 109 106  CO2 22  --   GLUCOSE 97 92  BUN 22 23  CREATININE 1.27* 1.30*  CALCIUM 9.2  --     CBC: Recent Labs  Lab 12/08/19 1332 12/08/19 1340  WBC 4.4  --   NEUTROABS 3.0  --   HGB 11.4* 11.9*  HCT 36.0 35.0*  MCV 98.4  --   PLT 149*  --     CBG: Recent Labs  Lab 12/08/19 1341  GLUCAP 85    Imaging: CT HEAD CODE STROKE WO CONTRAST  Result Date: 12/08/2019 CLINICAL DATA:  Code stroke.  Diplopia.  Additional provided: EXAM: CT HEAD WITHOUT CONTRAST TECHNIQUE: Contiguous axial images were obtained from the base of the skull through the vertex without intravenous contrast. COMPARISON:  No pertinent prior studies available for comparison. FINDINGS: Brain: There is no evidence of acute intracranial hemorrhage, intracranial mass, midline shift or extra-axial fluid collection.No demarcated cortical infarction. Age-indeterminate although favored chronic lacunar infarct within the left cerebellum. Moderate to advanced ill-defined hypoattenuation within the cerebral white matter is nonspecific, but consistent with chronic small vessel ischemic disease. Moderate generalized parenchymal atrophy. Vascular: No hyperdense vessel.  Atherosclerotic calcifications. Skull: Normal. Negative for fracture or focal lesion. Sinuses/Orbits: Visualized orbits demonstrate no acute abnormality. Minimal scattered paranasal sinus mucosal  thickening. No significant mastoid effusion. These results were called by telephone at the time of interpretation on 12/08/2019 at 1:57 pm to provider Dr. Lorraine Lax, who verbally acknowledged these results. IMPRESSION: 1. Age-indeterminate although favored chronic small lacunar infarct within the left cerebellum. 2. Otherwise, no evidence of acute intracranial abnormality. 3. Moderate generalized parenchymal atrophy with moderate to advanced chronic small vessel ischemic disease. Electronically Signed   By: Kellie Simmering DO   On: 12/08/2019 13:58       Laurey Morale, MSN, NP-C [facility] [phone]  12/08/2019, 2:29 PM   Attending physician note to follow with Assessment and plan .   Assessment: 85 y.o. female with PMH a.fib ( on eliquis), HTN, HLD who presented to [facility] ED for diplopia. Code stroke initiated in ED and later canceled. CTH was negative for hemorrhage; small lacunar infarct left cerebellum. Initial NIH was 0. Not a candidate for TPA d/t presenting outside the window. Will obtain MRI.  Stroke Risk Factors - atrial fibrillation, hyperlipidemia and hypertension    Recommendations: -- MRI brain to r/o stroke   --please page stroke NP  Or  PA  Or MD from 8am -4 pm  as this patient from this time will be  followed by the stroke.   You can look them up on [email or web address]  Password [email or web address]  NEUROHOSPITALIST ADDENDUM Performed a face to face diagnostic evaluation.   I have reviewed the contents of history and physical exam as documented by PA/ARNP/Resident and agree with above documentation.  I have discussed and formulated the above plan as documented. Edits to the note have been made as needed.   85 year old female with past medical history significant for A. fib on Eliquis, hypertension, hyperlipidemia presents to diplopia and dizziness on waking up this morning.  She notes the 5:00 when she woke up, and then went back to bed and then woke up again at 7 AM.  Continues to have  mild double vision and sensation of lightheadedness.  Code stroke was called in the ED-stat CT head was obtained which showed no hemorrhage.  Code stroke was canceled as patient not a candidate for IV TPA because she is on Eliquis and outside window.  Clinically not large vessel occlusion.  On examination, patient has bilateral deafness which is chronic.  She has no nystagmus, no slurred speech, no facial droop.  No dysmetria.  No significant imbalance per NP.Marland Kitchen  Test of skew negative, did not perform head impulse test   Recommended MRI brain to evaluate for acute stroke.  If negative likely fatigue, neuromuscular weakness.  Patient just received second vaccine.      Karena Addison Shruti Arrey MD [facility] [phone]   If 7pm to 7am, please call on call as listed on AMION.

## 2019-12-08 NOTE — ED Triage Notes (Signed)
Pt reports she got her 2nd covid vaccine yesterday, she states that she felt fine when she went to bed at 12 am, she states she woke up at 5am and noticed she had double vision in both eyes. Pt a/ox4, speech clear, grip strength equal, no arm drift, face symmetrical.

## 2019-12-08 NOTE — Telephone Encounter (Signed)
I received a phone call from the pharmacist at [facility].  The pt got her 2nd COVID-19 vaccine yesterday.  Today, she has noted dizziness, double vision.  I spoke to the pharmacist that took the phone call from the patient.  I tried to call the patient but there was no answer.  I finally spoke to her husband, who notes he took her to the ED at [facility]. Richardson Dopp, PA-C    12/08/2019 1:23 PM

## 2019-12-08 NOTE — ED Notes (Signed)
Patient verbalizes understanding of discharge instructions. Opportunity for questioning and answers were provided. Armband removed by staff, pt discharged from ED to home via POV  

## 2019-12-08 NOTE — Discharge Instructions (Addendum)
Make sure you get plenty of rest, take your usual medicines and drink a lot of fluids.  See your doctor if you are not improved by Monday or Tuesday.

## 2019-12-08 NOTE — ED Notes (Signed)
Pt transported to MRI 

## 2019-12-13 ENCOUNTER — Ambulatory Visit: Payer: Medicare Other | Admitting: Physician Assistant

## 2019-12-13 ENCOUNTER — Encounter: Payer: Self-pay | Admitting: Physician Assistant

## 2019-12-13 ENCOUNTER — Other Ambulatory Visit: Payer: Self-pay

## 2019-12-13 ENCOUNTER — Ambulatory Visit (HOSPITAL_COMMUNITY): Payer: Medicare PPO | Attending: Internal Medicine

## 2019-12-13 VITALS — BP 126/60 | HR 76 | Ht 68.0 in | Wt 150.6 lb

## 2019-12-13 DIAGNOSIS — I340 Nonrheumatic mitral (valve) insufficiency: Secondary | ICD-10-CM | POA: Diagnosis not present

## 2019-12-13 DIAGNOSIS — Z9889 Other specified postprocedural states: Secondary | ICD-10-CM | POA: Diagnosis not present

## 2019-12-13 DIAGNOSIS — Z955 Presence of coronary angioplasty implant and graft: Secondary | ICD-10-CM | POA: Diagnosis not present

## 2019-12-13 DIAGNOSIS — Z853 Personal history of malignant neoplasm of breast: Secondary | ICD-10-CM | POA: Diagnosis not present

## 2019-12-13 DIAGNOSIS — Z954 Presence of other heart-valve replacement: Secondary | ICD-10-CM | POA: Diagnosis not present

## 2019-12-13 DIAGNOSIS — I2510 Atherosclerotic heart disease of native coronary artery without angina pectoris: Secondary | ICD-10-CM | POA: Insufficient documentation

## 2019-12-13 DIAGNOSIS — I4891 Unspecified atrial fibrillation: Secondary | ICD-10-CM | POA: Insufficient documentation

## 2019-12-13 DIAGNOSIS — I421 Obstructive hypertrophic cardiomyopathy: Secondary | ICD-10-CM | POA: Diagnosis not present

## 2019-12-13 DIAGNOSIS — I119 Hypertensive heart disease without heart failure: Secondary | ICD-10-CM | POA: Diagnosis not present

## 2019-12-13 DIAGNOSIS — Z923 Personal history of irradiation: Secondary | ICD-10-CM | POA: Diagnosis not present

## 2019-12-13 DIAGNOSIS — E785 Hyperlipidemia, unspecified: Secondary | ICD-10-CM | POA: Insufficient documentation

## 2019-12-13 NOTE — Progress Notes (Signed)
6 Minute Walk Test Results  Patient: Kim [name] Date:  12/13/2019   Supplemental O2 during test? No      Baseline   End  Time   1432    1433.5 Heartrate  76    69 Dyspnea  none    none Fatigue  none    none O2 sat   94%    98% Blood pressure 126/60    126/70   Patient ambulated at a slow pace for a total distance of 138 feet with no stops.  Ambulation was limited primarily due to hip pain.  Overall the test was not tolerated well. The patient stated she "did not know how this is improving anything" and said she wanted to stop. The walk test was terminated after 1 minute and 35 seconds.

## 2019-12-13 NOTE — Progress Notes (Signed)
[facility] [place]                                       Cardiology Office Note    Date:  12/13/2019   ID:  Toma Deiters, DOB 1935/10/04, MRN [id number]  PCP:  Sueanne Margarita, DO  Cardiologist: Dr. Aundra Dubin  CC: 1 year s/p MitraClip  History of Present Illness:  Kim [name] is a 85 y.o. female with a history of  CAD s/p PCI of RCA (2015), HOCM, HTN, HLD, hypothyroidism, persistent atrial fibrillation maintaining sinus s/p DCCV, MVP with mitral valve prolapse with primary mitral regurgitations/p MitraClip (12/06/18) who presents to clinic for follow up.   Patient had breast cancer in 2015 and received treatment involving Herceptin.  During breast cancer treatment, she developed unstable angina and ended up getting a DES to the mid RCA in 3/15.  Patient additionally has a history of HOCM.  This has been recognized on prior echoes.  She has severe asymmetric basal septal hypertrophy and SAM with LVOT gradient peak 58 mmHg on echo in 3/15 along with moderate MR.  Repeat echo in 7/15 showed LVOT gradient down to 30 mmHg on higher beta blocker.  Repeat echo in 11/15 showed no significant LVOT gradient but SAM still present.  Echo (8/16) showed asymmetric septal hypertrophy, No SAM, no significant LVOT gradient.  Echo 4/18 showed EF 55-60%, small LVOT gradient with severe asymmetric septal hypertrophy, mild MR, PASP 32 mmHg.    She was admitted in 1/20 with symptomatic atrial fibrillation with RVR, this was a new diagnosis.  She was started on Eliquis and cardioverted after TEE back to NSR.  TEE showed severe mitral regurgitation that appeared to be due mostly to posterior leaflet prolapse, there was minimal systolic anterior motion.   She underwentsuccessful percutaneous mitral valve with placement of 2 MitraClip NTR devices on the medial and lateral aspects of A2/P2 scallops, with reduction in MR from 4+ to trace. She was resumed on home Eliquis  and started on aASA 81 mg daily x 3 months. Post op echocardiogram showed EF 65%, no mitral regurgitation with mild MS with a mean gradient of 4 mm Hg. 1 month echo 04/03/19 showed EF 60-65%, s/p Mitraclip with mild residual MR, mean gradient 3 mm Hg.   Had the vaccine last week and then got dizziness and double vision. She was seen in the ER and MRI was okay.   Today she presents to clinic for follow up. Has onoing hip and back pain which limits her activity. She also just says she doesn't feel like doing much. The most exercise she gets is walking around her house and to the car. She terminated 6 minute walk test early because she refused to go any longer. Has mild LE edema since her last back surgery. No orthopnea or PND. She says she no longer has dyspnea on exertion since having her MitraClip.    Past Medical History:  Diagnosis Date  . Allergic rhinitis   . Allergy   . Arthritis    hands  . Breast cancer of upper-outer quadrant of left female breast ([place]) 11/08/13   finished chemo and radiation 2015  . CAD (coronary artery disease)    a. 12/2013 Cath/PCI: LM nl, LAD 20p, 44m, 30d, D1 30p, LCX 20p, 69m, OM1 20, Mo2 40p, RCA 30p, 88m (4.0x16 Promus Premier  DES), 20d, RPL 30, RPDA 70p, 89m, EF 65-70%.  . Diverticulosis   . HOH (hard of hearing)    bilateral, wears hearing aids  . Hx of adenomatous colonic polyps 02/1999  . Hyperlipidemia   . Hypertension   . Hypertrophic obstructive cardiomyopathy (HOCM) [facility])    Cardiologist is Dr. [name]  . Hypothyroidism   . Iron deficiency anemia due to chronic blood loss 01/07/2016  . Low back pain    gets ESI from Dr. Rennis Harding  . Malabsorption of iron 01/07/2016  . Myocardial infarction ([place]) 12/2013   very mild-with first chemo -placed stent x1  . Persistent atrial fibrillation with rapid ventricular response ([place]) 10/14/2018  . Personal history of radiation therapy 2015  . S/P radiation therapy 05/14/2014-06/26/2014   1) Left Breast  / 50 Gy  in 25 fractions, 2) Left Supraclavicular fossa/ 47.5 Gy in 25 fractions, 3) Left Posterior Axillary boost / 4.825 Gy in 25 fractions, 4) Left Breast boost / 10 Gy in 5 fractions   . Tubulovillous adenoma of colon 12/2009   with HGD  . Wears hearing aid    left and right     Past Surgical History:  Procedure Laterality Date  . ABDOMINAL HYSTERECTOMY    . ANTERIOR AND POSTERIOR REPAIR N/A 01/17/2015   Procedure: CYSTOCELE REPAIR ;  Surgeon: Princess Bruins, MD;  Location: [facility];  Service: Gynecology;  Laterality: N/A;  . BILATERAL SALPINGOOPHORECTOMY     see Laurin Coder NP for GYN exams  . BREAST BIOPSY Left 10/18/2013  . BREAST BIOPSY Left 10/31/2013  . BREAST LUMPECTOMY Left 11/05/2013  . BREAST LUMPECTOMY WITH NEEDLE LOCALIZATION AND AXILLARY LYMPH NODE DISSECTION Left 11/05/2013   Procedure: LEFT BREAST LUMPECTOMY WITH NEEDLE LOCALIZATION and axillary lymph Node Dissection;  Surgeon: Edward Jolly, MD;  Location: [place];  Service: General;  Laterality: Left;  . BREAST SURGERY  11/2013   left lumpectomy  . CARDIOVERSION N/A 10/17/2018   Procedure: CARDIOVERSION;  Surgeon: Larey Dresser, MD;  Location: [facility] ENDOSCOPY;  Service: Cardiovascular;  Laterality: N/A;  . CARDIOVERSION N/A 11/08/2018   Procedure: CARDIOVERSION;  Surgeon: Larey Dresser, MD;  Location: [place];  Service: Cardiovascular;  Laterality: N/A;  . CATARACT EXTRACTION W/ INTRAOCULAR LENS  IMPLANT, BILATERAL  2012   bilateral  . COLONOSCOPY  11/04/2015   per Dr. Fuller Plan, adenomatous polyps, no repeats planned   . EYE SURGERY  11/22/2006   cataracts, bilateral, intraocular lens implant  . JOINT REPLACEMENT     2019  . LEFT HEART CATHETERIZATION WITH CORONARY ANGIOGRAM N/A 12/19/2013   Procedure: LEFT HEART CATHETERIZATION WITH CORONARY ANGIOGRAM;  Surgeon: Burnell Blanks, MD;  Location: [facility] CATH LAB;  Service: Cardiovascular;  Laterality: N/A;  . lumbar epidural steroid injection      lumber spinal stenosis  . LUMBAR LAMINECTOMY/DECOMPRESSION MICRODISCECTOMY Right 03/01/2018   Procedure: RIGHT LUMBAR FOUR- LUMBAR FIVE LAMINECTOMY/MICRODISCECTOMY;  Surgeon: [name], MD;  Location: [place];  Service: Neurosurgery;  Laterality: Right;  . MITRAL VALVE REPAIR N/A 12/06/2018   Procedure: MITRAL VALVE REPAIR;  Surgeon: Sherren Mocha, MD;  Location: [place] CV LAB;  Service: Cardiovascular;  Laterality: N/A;  . POLYPECTOMY    . PORTACATH PLACEMENT Right 12/11/2013   Procedure: INSERTION PORT-A-CATH;  Surgeon: Edward Jolly, MD;  Location: [place];  Service: General;  Laterality: Right;  Subclavian Vein;   . RIGHT/LEFT HEART CATH AND CORONARY ANGIOGRAPHY N/A 11/03/2018   Procedure: RIGHT/LEFT HEART  CATH AND CORONARY ANGIOGRAPHY;  Surgeon: Larey Dresser, MD;  Location: [place] CV LAB;  Service: Cardiovascular;  Laterality: N/A;  . TEE WITHOUT CARDIOVERSION N/A 10/17/2018   Procedure: TRANSESOPHAGEAL ECHOCARDIOGRAM (TEE);  Surgeon: Larey Dresser, MD;  Location: [facility] ENDOSCOPY;  Service: Cardiovascular;  Laterality: N/A;  . TEE WITHOUT CARDIOVERSION N/A 11/08/2018   Procedure: TRANSESOPHAGEAL ECHOCARDIOGRAM (TEE);  Surgeon: Larey Dresser, MD;  Location: [facility] ENDOSCOPY;  Service: Cardiovascular;  Laterality: N/A;  . TOTAL KNEE ARTHROPLASTY Right 10/24/2017   Procedure: TOTAL RIGHT KNEE ARTHROPLASTY;  Surgeon: Gaynelle Arabian, MD;  Location: [facility];  Service: Orthopedics;  Laterality: Right;    Current Medications: Outpatient Medications Prior to Visit  Medication Sig Dispense Refill  . apixaban (ELIQUIS) 5 MG TABS tablet Take 5 mg by mouth 2 (two) times daily.    . Calcium Carbonate-Vitamin D (CALCIUM-D PO) Take 1 tablet by mouth daily.    Marland Kitchen diltiazem (CARDIZEM CD) 120 MG 24 hr capsule TAKE 1 CAPSULE BY MOUTH ONCE DAILY (Patient taking differently: Take 120 mg by mouth daily. ) 90 capsule 0  . exemestane (AROMASIN) 25 MG tablet Take 25 mg by mouth  daily after breakfast.    . ezetimibe (ZETIA) 10 MG tablet TAKE 1 TABLET BY MOUTH ONCE DAILY (Patient taking differently: Take 10 mg by mouth daily. ) 90 tablet 0  . furosemide (LASIX) 40 MG tablet Take 1 tablet (40 mg total) by mouth as needed. For weight 144 or greater 30 tablet 5  . levothyroxine (SYNTHROID, LEVOTHROID) 137 MCG tablet Take 1 tablet (137 mcg total) by mouth daily before breakfast. 90 tablet 3  . metoprolol succinate (TOPROL-XL) 50 MG 24 hr tablet TAKE 1 TABLET BY MOUTH ONCE DAILY WITH OR IMMEDIATELY FOLLOWING A MEAL (Patient taking differently: Take 50 mg by mouth daily. ) 30 tablet 1  . Multiple Vitamin (MULTIVITAMIN WITH MINERALS) TABS tablet Take 1 tablet by mouth daily.    . potassium chloride (K-DUR) 10 MEQ tablet Take 2 tablets (20 mEq total) by mouth as needed. Only take 20 meq if you take lasix (Patient not taking: Reported on 12/08/2019) 60 tablet 5   No facility-administered medications prior to visit.     Allergies:   Penicillins, Propoxyphene n-acetaminophen, Evolocumab, Levaquin [levofloxacin], Statins, Cortisone, Myrbetriq [mirabegron], and Pseudoephedrine   Social History   Socioeconomic History  . Marital status: Married    Spouse name: Not on file  . Number of children: 2  . Years of education: Not on file  . Highest education level: Not on file  Occupational History  . Occupation: Retired-school system  Tobacco Use  . Smoking status: Never Smoker  . Smokeless tobacco: Never Used  . Tobacco comment: never used tobacco  Substance and Sexual Activity  . Alcohol use: No    Alcohol/week: 0.0 standard drinks  . Drug use: No  . Sexual activity: Not Currently    Birth control/protection: Post-menopausal  Other Topics Concern  . Not on file  Social History Narrative   Lives at home with husband.     Social Determinants of Health   Financial Resource Strain:   . Difficulty of Paying Living Expenses:   Food Insecurity:   . Worried About Ship broker in the Last Year:   . Arboriculturist in the Last Year:   Transportation Needs:   . Film/video editor (Medical):   Marland Kitchen Lack of Transportation (Non-Medical):   Physical Activity:   . Days of Exercise  per Week:   . Minutes of Exercise per Session:   Stress:   . Feeling of Stress :   Social Connections:   . Frequency of Communication with Friends and Family:   . Frequency of Social Gatherings with Friends and Family:   . Attends Religious Services:   . Active Member of Clubs or Organizations:   . Attends Archivist Meetings:   Marland Kitchen Marital Status:      Family History:  The patient's family history includes Alcohol abuse in her father; Kidney failure in her mother.     ROS:   Please see the history of present illness.    ROS All other systems reviewed and are negative.   PHYSICAL EXAM:   VS:  LMP 10/19/1991    GEN: Well nourished, well developed, in no acute distress HEENT: normal Neck: no JVD or masses Cardiac: RRR; soft holosystolic murmur. No rubs, or gallops,no edema  Respiratory:  clear to auscultation bilaterally, normal work of breathing GI: soft, nontender, nondistended, + BS MS: no deformity or atrophy Skin: warm and dry, no rash Neuro:  Alert and Oriented x 3, Strength and sensation are intact Psych: euthymic mood, full affect   Wt Readings from Last 3 Encounters:  11/19/19 150 lb (68 kg)  11/13/19 151 lb 3.2 oz (68.6 kg)  08/07/19 149 lb 9.6 oz (67.9 kg)      Studies/Labs Reviewed:   EKG:  EKG is NOT ordered today.    Recent Labs: 12/08/2019: ALT 10; BUN 23; Creatinine, Ser 1.30; Hemoglobin 11.9; Platelets 149; Potassium 4.0; Sodium 139   Lipid Panel    Component Value Date/Time   CHOL 193 11/13/2019 0921   TRIG 146 11/13/2019 0921   HDL 43 11/13/2019 0921   CHOLHDL 4.5 11/13/2019 0921   VLDL 29 11/13/2019 0921   LDLCALC 121 (H) 11/13/2019 0921   LDLDIRECT 121.2 07/24/2014 0900    Additional studies/ records that were reviewed  today include:  12/06/2018 MITRAL VALVE REPAIR  Conclusion  Successful percutaneous mitral valve with placement of 2 MitraClip NTR devices on the medial and lateral aspects of A2/P2 scallops, with reduction in MR from 4+ to trace  Recommend: ASA 81 mg daily x 3 months. Resume Apixaban in the am.   _____________  Echo 12/07/18 IMPRESSIONS 1. The left ventricle has hyperdynamic systolic function, with an ejection fraction of >65%. There is mildly increased left ventricular wall thickness. 2. Left atrial size was massively dilated. 3. The aortic valve is tricuspid. Mild sclerosis of the aortic valve. Mild aortic annular calcification noted. 4. The tricuspid valve was normal in structure. 5. The pulmonic valve was normal in structure. 6. - MitraClip: There is a mitraclip present that appears stable in position. There is no trivial mitral regurgitation by colorflow doppler. There is very mild mitral stenosis with MV Mean grad: 4.0 mmHg and MV Area (PHT): 1.74 cm.  _____________   Echo 04/03/19 IMPRESSIONS 1. The left ventricle has normal systolic function with an ejection  fraction of 60-65%. The cavity size was normal. There is severe asymmetric  left ventricular hypertrophy. Findings are consistent with hypertrophic  cardiomyopathy. Left ventricular  diastolic Doppler parameters are consistent with pseudonormalization. No  evidence of left ventricular regional wall motion abnormalities.  2. The right ventricle has normal systolic function. The cavity was  normal. There is no increase in right ventricular wall thickness.  3. A Mitral Clip valve is present in the mitral position. Procedure Date:  12/07/18.  4. There  is moderate mitral annular calcification present.  5. Mitra clip in place.   Mean gradient 40mmHg, mild.   Trivial regurgitation.  6. The aortic valve is tricuspid. Mild thickening of the aortic valve.  Mild calcification of the aortic valve.     _____________   Echo 12/13/19 IMPRESSIONS    1. Hypertrophic cardiomyopathy, basal septum measures 20 mm. Dynamic LVOT  obstruction with maximal instanteous gradient of 37 mmHg with Valsalva.  The mitral valve demonstrates systolic anterior motion of the clipped  segment, visually similar to prior  exam. Left ventricular ejection fraction, by estimation, is 65%. The left  ventricle has normal function. The left ventricle has no regional wall  motion abnormalities. There is severe asymmetric left ventricular  hypertrophy of the basal-septal segment.  Left ventricular diastolic parameters are indeterminate.  2. Right ventricular systolic function is normal. The right ventricular  size is normal. There is mildly elevated pulmonary artery systolic  pressure. The estimated right ventricular systolic pressure is [id number] mmHg.  3. Left atrial size was severely dilated.  4. The mitral valve has been repaired/replaced. Mild mitral valve regurgitation. The mean mitral valve gradient is 3.0 mmHg with average  heart rate of 66 bpm. 2 MitraClip devices placed, no evidenced of displacement or dehiscence.  5. The aortic valve is tricuspid. Aortic valve regurgitation is trivial.  No aortic stenosis is present.  6. The inferior vena cava is dilated in size with >50% respiratory  variability, suggesting right atrial pressure of 8 mmHg.   Comparison(s): A prior study was performed on 04/03/19. Prior images reviewed side by side. Degree of mitral regurgitation has increased. MR appears posteriorly directed, and may signify dynamic MR secondary to SAM from LVOT obstruction.    ASSESSMENT & PLAN:   Severe MR s/p MitraClip: echo shows EF 65% with HOCM and mild residual MR ( degree of mitral regurgitation has increased. MR  appears posteriorly directed, and may signify dynamic MR secondary to SAM from LVOT obstruction.) There is SAM of the clipped segment. Fortunately, the patient has a significant  symptomatic improvement since MitraClip. She has NYHA class I symptoms, but is not very active, mostly because she "chooses not to do much". SBE prophylaxis discussed; her dentist Rx's Abx for her. She will continue on long term Eliquis 5mg  BID.    Medication Adjustments/Labs and Tests Ordered: Current medicines are reviewed at length with the patient today.  Concerns regarding medicines are outlined above.  Medication changes, Labs and Tests ordered today are listed in the Patient Instructions below. There are no Patient Instructions on file for this visit.   Signed, Angelena Form, PA-C  12/13/2019 10:16 AM    [facility] [address], [place], [place]  [id number] Phone: [phone]; Fax: [phone]

## 2019-12-13 NOTE — Patient Instructions (Signed)
Please keep your follow up appointments.

## 2019-12-18 ENCOUNTER — Other Ambulatory Visit: Payer: Self-pay | Admitting: Internal Medicine

## 2019-12-18 DIAGNOSIS — Z1231 Encounter for screening mammogram for malignant neoplasm of breast: Secondary | ICD-10-CM

## 2019-12-20 DIAGNOSIS — N390 Urinary tract infection, site not specified: Secondary | ICD-10-CM | POA: Diagnosis not present

## 2020-01-17 DIAGNOSIS — I340 Nonrheumatic mitral (valve) insufficiency: Secondary | ICD-10-CM | POA: Diagnosis not present

## 2020-01-17 DIAGNOSIS — R350 Frequency of micturition: Secondary | ICD-10-CM | POA: Diagnosis not present

## 2020-01-17 DIAGNOSIS — N1831 Chronic kidney disease, stage 3a: Secondary | ICD-10-CM | POA: Diagnosis not present

## 2020-01-17 DIAGNOSIS — I4891 Unspecified atrial fibrillation: Secondary | ICD-10-CM | POA: Diagnosis not present

## 2020-01-17 DIAGNOSIS — I422 Other hypertrophic cardiomyopathy: Secondary | ICD-10-CM | POA: Diagnosis not present

## 2020-01-17 DIAGNOSIS — R2681 Unsteadiness on feet: Secondary | ICD-10-CM | POA: Diagnosis not present

## 2020-01-17 DIAGNOSIS — M549 Dorsalgia, unspecified: Secondary | ICD-10-CM | POA: Diagnosis not present

## 2020-01-17 DIAGNOSIS — I2510 Atherosclerotic heart disease of native coronary artery without angina pectoris: Secondary | ICD-10-CM | POA: Diagnosis not present

## 2020-01-17 DIAGNOSIS — D6869 Other thrombophilia: Secondary | ICD-10-CM | POA: Diagnosis not present

## 2020-01-22 ENCOUNTER — Other Ambulatory Visit (HOSPITAL_COMMUNITY): Payer: Self-pay | Admitting: Internal Medicine

## 2020-01-26 IMAGING — MR MR HIP*L* W/O CM
4 of 6 series · 19 of 40 positions shown · non-contrast
Comparison: None.

CLINICAL DATA: Left hip pain.  Unable to lay on the back.

EXAM:
MR OF THE LEFT HIP WITHOUT CONTRAST
TECHNIQUE: Multiplanar, multisequence MR imaging was performed. No intravenous
contrast was administered.

[Series 11: STIR · coronal · 4.0mm · 0.78mm/px · 8 of 34 slices shown]
[im 1/34]
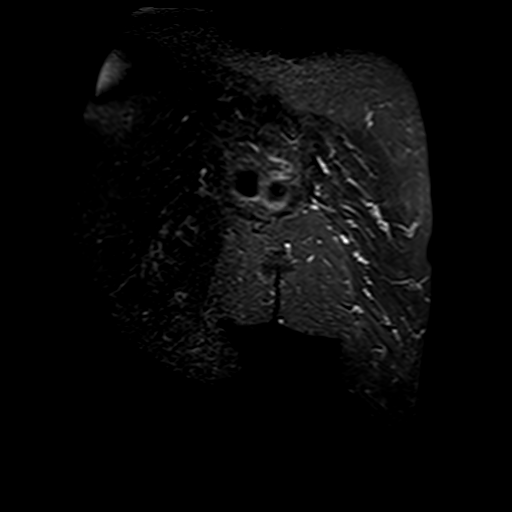
[im 5/34]
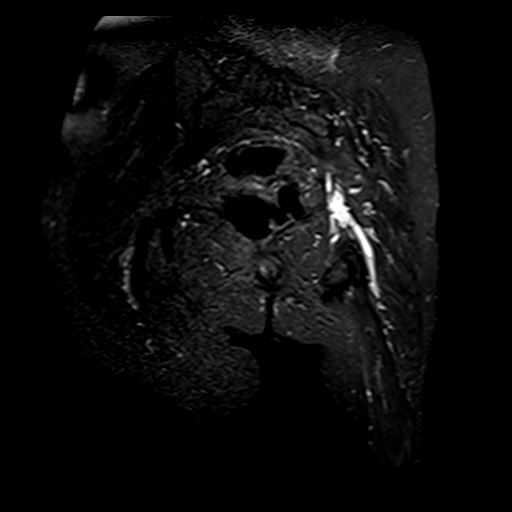
[im 10/34]
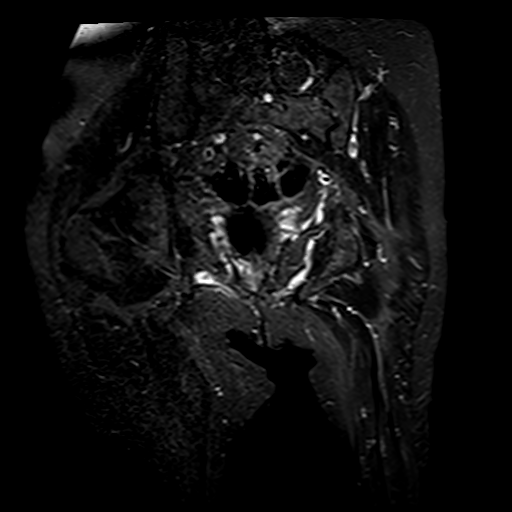
[im 15/34]
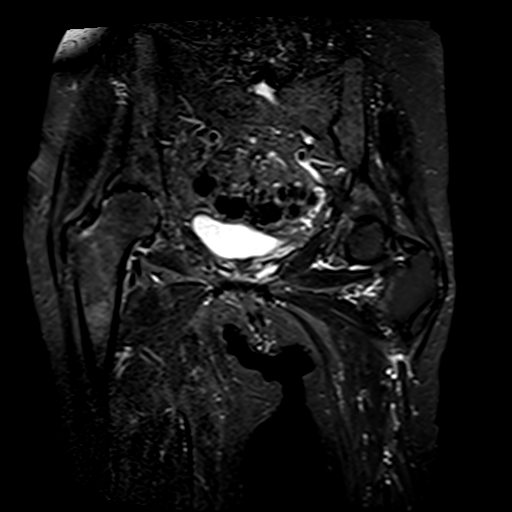
[im 19/34]
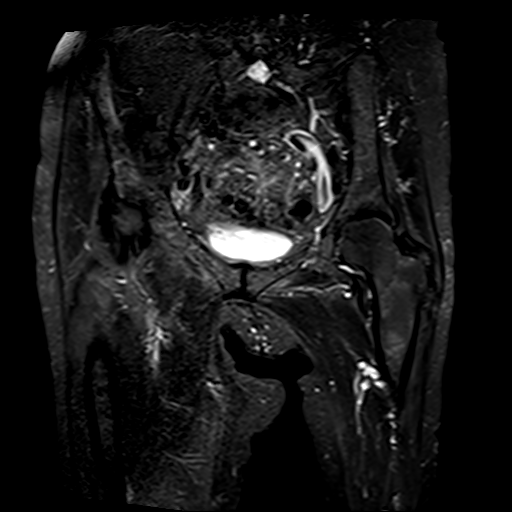
[im 24/34]
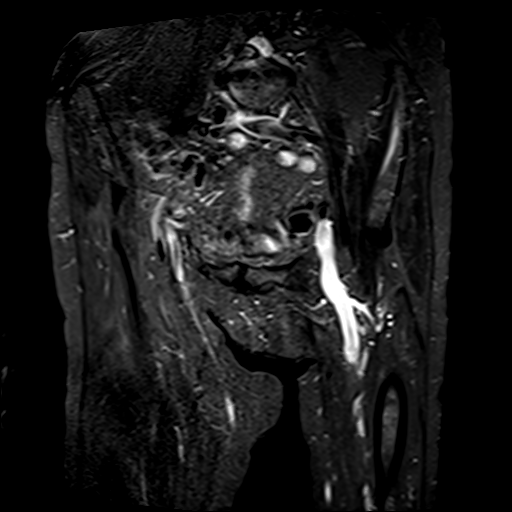
[im 29/34]
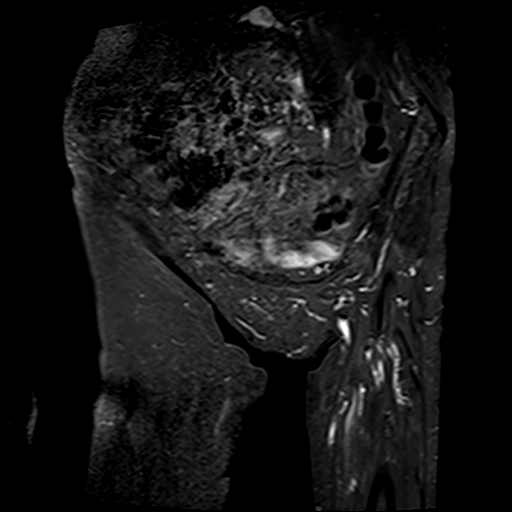
[im 34/34]
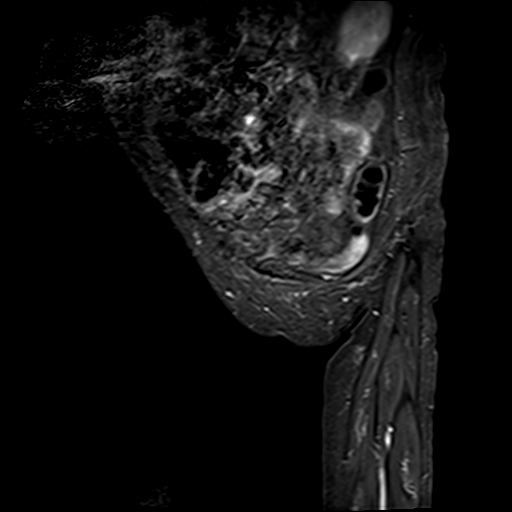

[Series 12: T1 · coronal · 4.0mm · 0.62mm/px · 5 of 34 slices shown]
[im 1/34]
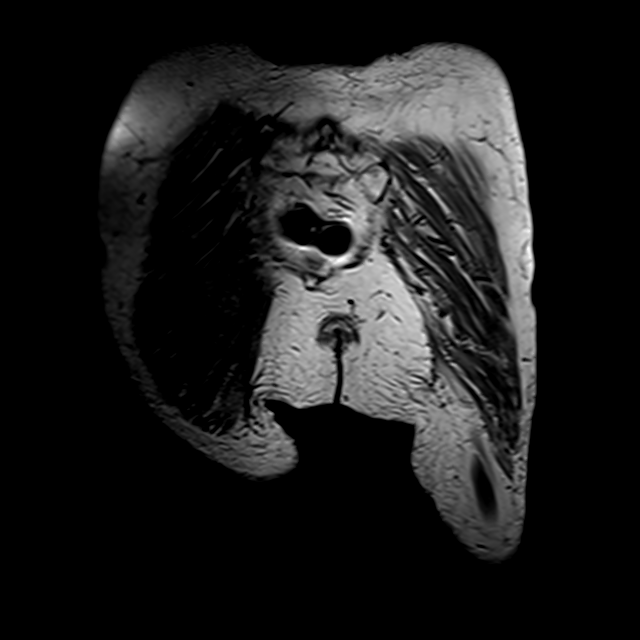
[im 5/34]
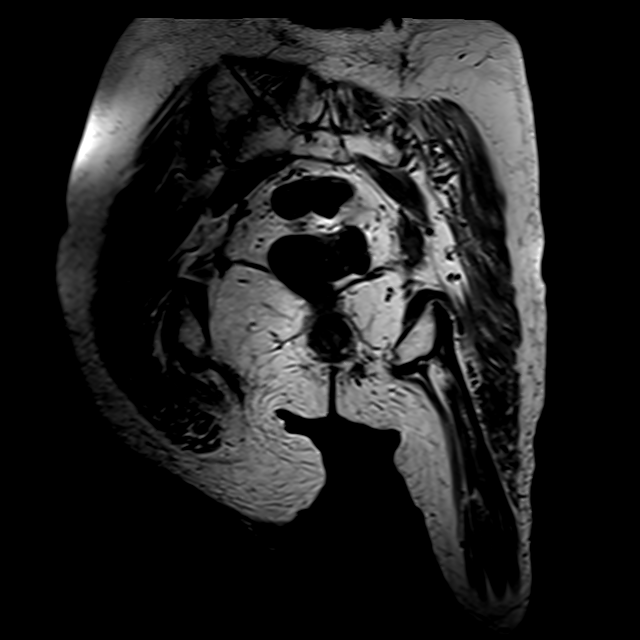
[im 10/34]
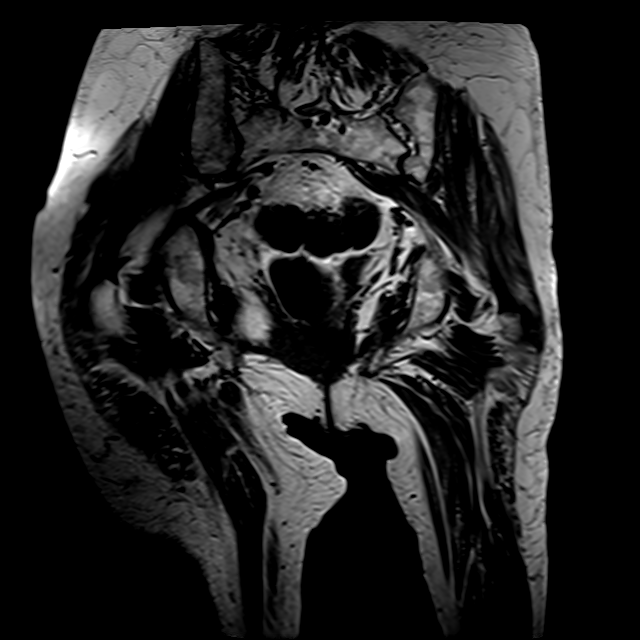
[im 19/34]
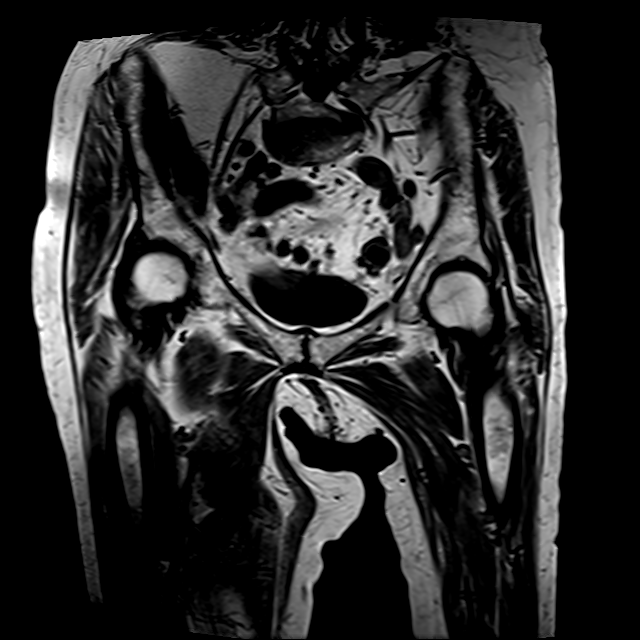
[im 29/34]
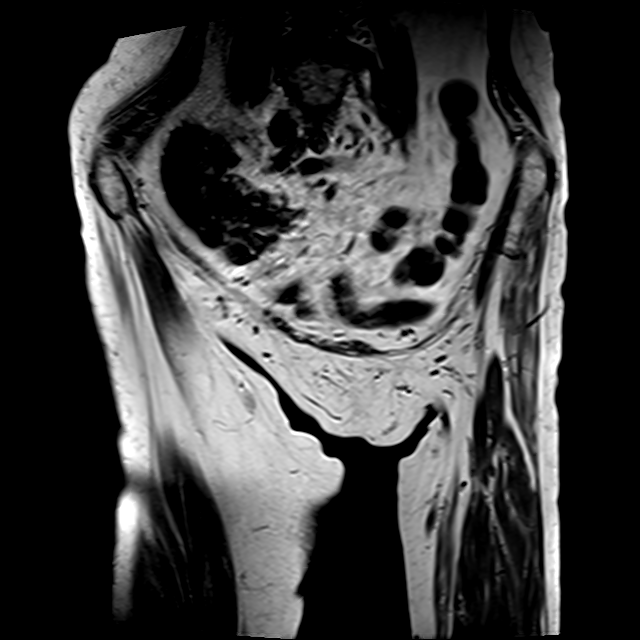

[Series 14: PD fat-sat · sagittal · 4.0mm · 0.70mm/px · 3 of 24 slices shown]
[im 5/24]
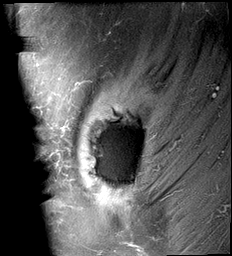
[im 14/24]
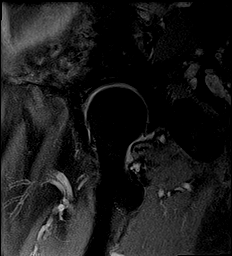
[im 24/24]
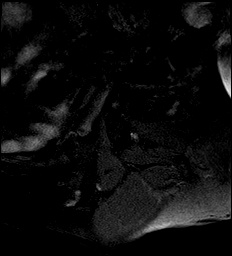

[Series 15: T2 fat-sat · axial · 4.0mm · 0.35mm/px · z∈[-115,-39]mm · 3 of 28 slices shown]
[im 5/28]
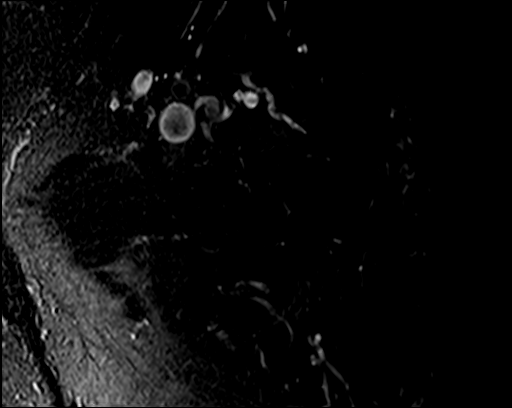
[im 14/28]
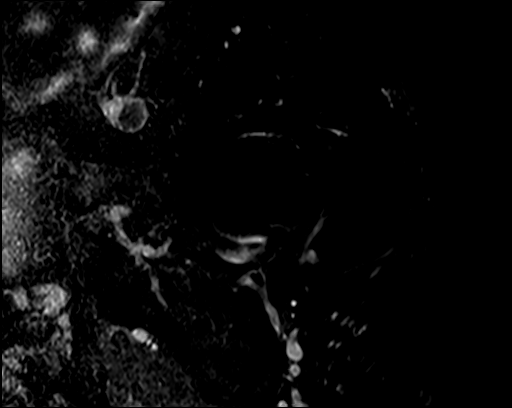
[im 23/28]
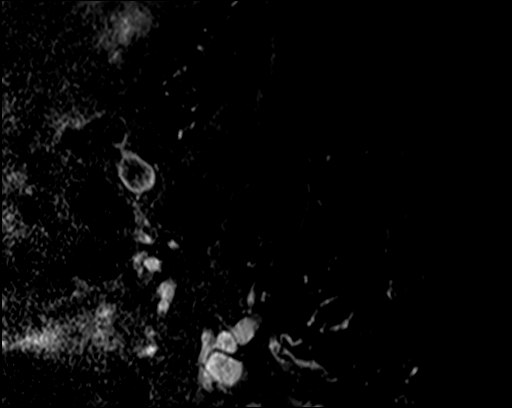

[19 of 40 positions shown; findings below may reference images not displayed]

FINDINGS: Bones: No hip fracture, dislocation or avascular necrosis. No
periosteal reaction or bone destruction. No aggressive osseous
lesion.

Normal sacrum and sacroiliac joints. No SI joint widening or erosive
changes.

Degenerative disease with disc height loss of the lower lumbar
spine.

Articular cartilage and labrum

Articular cartilage: Partial-thickness cartilage loss of the left
hip. Partial-thickness cartilage loss of the right hip.

Labrum: Left labral degeneration with a superior and anterior labral
tear.

Joint or bursal effusion

Joint effusion:  No hip joint effusion.  No SI joint effusion.

Bursae:  No bursa formation.

Muscles and tendons

Flexors: Normal.

Extensors: Normal.

Abductors: Normal.

Adductors: Normal.

Gluteals: Mild tendinosis of the left gluteus minimus tendon
insertion.

Hamstrings: Normal.

Other findings

Miscellaneous: No pelvic free fluid. No fluid collection or
hematoma. No inguinal lymphadenopathy. No inguinal hernia.
Diverticulosis without evidence of diverticulitis.
IMPRESSION: 1. Mild osteoarthritis of bilateral hips.
2. No hip fracture, dislocation or avascular necrosis.
3. Mild tendinosis of the left gluteus minimus tendon insertion.

## 2020-01-28 ENCOUNTER — Ambulatory Visit
Admission: RE | Admit: 2020-01-28 | Discharge: 2020-01-28 | Disposition: A | Discharge status: Home / Self Care | Payer: Medicare PPO | Source: Ambulatory Visit | Attending: Internal Medicine | Admitting: Internal Medicine

## 2020-01-28 ENCOUNTER — Other Ambulatory Visit: Payer: Self-pay

## 2020-01-28 DIAGNOSIS — Z1231 Encounter for screening mammogram for malignant neoplasm of breast: Secondary | ICD-10-CM

## 2020-02-04 ENCOUNTER — Telehealth: Payer: Self-pay | Admitting: Hematology & Oncology

## 2020-02-04 NOTE — Telephone Encounter (Signed)
appointments added/calendar miledper 2/15 los

## 2020-02-08 ENCOUNTER — Other Ambulatory Visit (HOSPITAL_COMMUNITY): Payer: Self-pay | Admitting: Cardiology

## 2020-03-12 ENCOUNTER — Ambulatory Visit (HOSPITAL_COMMUNITY)
Admission: RE | Admit: 2020-03-12 | Discharge: 2020-03-12 | Disposition: A | Discharge status: Home / Self Care | Payer: Medicare PPO | Source: Ambulatory Visit | Attending: Cardiology | Admitting: Cardiology

## 2020-03-12 ENCOUNTER — Encounter (HOSPITAL_COMMUNITY): Payer: Self-pay | Admitting: Cardiology

## 2020-03-12 ENCOUNTER — Other Ambulatory Visit (HOSPITAL_COMMUNITY): Payer: Medicare PPO

## 2020-03-12 ENCOUNTER — Other Ambulatory Visit: Payer: Self-pay

## 2020-03-12 VITALS — BP 130/72 | HR 61 | Ht 68.0 in | Wt 154.0 lb

## 2020-03-12 DIAGNOSIS — Z90710 Acquired absence of both cervix and uterus: Secondary | ICD-10-CM | POA: Insufficient documentation

## 2020-03-12 DIAGNOSIS — Z79890 Hormone replacement therapy (postmenopausal): Secondary | ICD-10-CM | POA: Insufficient documentation

## 2020-03-12 DIAGNOSIS — E785 Hyperlipidemia, unspecified: Secondary | ICD-10-CM | POA: Diagnosis not present

## 2020-03-12 DIAGNOSIS — I421 Obstructive hypertrophic cardiomyopathy: Secondary | ICD-10-CM | POA: Diagnosis not present

## 2020-03-12 DIAGNOSIS — Z90722 Acquired absence of ovaries, bilateral: Secondary | ICD-10-CM | POA: Diagnosis not present

## 2020-03-12 DIAGNOSIS — Z955 Presence of coronary angioplasty implant and graft: Secondary | ICD-10-CM | POA: Insufficient documentation

## 2020-03-12 DIAGNOSIS — I480 Paroxysmal atrial fibrillation: Secondary | ICD-10-CM

## 2020-03-12 DIAGNOSIS — Z79811 Long term (current) use of aromatase inhibitors: Secondary | ICD-10-CM | POA: Insufficient documentation

## 2020-03-12 DIAGNOSIS — E039 Hypothyroidism, unspecified: Secondary | ICD-10-CM | POA: Diagnosis not present

## 2020-03-12 DIAGNOSIS — I5032 Chronic diastolic (congestive) heart failure: Secondary | ICD-10-CM | POA: Diagnosis not present

## 2020-03-12 DIAGNOSIS — Z79899 Other long term (current) drug therapy: Secondary | ICD-10-CM | POA: Diagnosis not present

## 2020-03-12 DIAGNOSIS — Z7901 Long term (current) use of anticoagulants: Secondary | ICD-10-CM | POA: Diagnosis not present

## 2020-03-12 DIAGNOSIS — I110 Hypertensive heart disease with heart failure: Secondary | ICD-10-CM | POA: Insufficient documentation

## 2020-03-12 DIAGNOSIS — I2510 Atherosclerotic heart disease of native coronary artery without angina pectoris: Secondary | ICD-10-CM | POA: Insufficient documentation

## 2020-03-12 DIAGNOSIS — C50919 Malignant neoplasm of unspecified site of unspecified female breast: Secondary | ICD-10-CM | POA: Diagnosis not present

## 2020-03-12 DIAGNOSIS — I340 Nonrheumatic mitral (valve) insufficiency: Secondary | ICD-10-CM | POA: Diagnosis not present

## 2020-03-12 DIAGNOSIS — Z8249 Family history of ischemic heart disease and other diseases of the circulatory system: Secondary | ICD-10-CM | POA: Insufficient documentation

## 2020-03-12 DIAGNOSIS — Z9221 Personal history of antineoplastic chemotherapy: Secondary | ICD-10-CM | POA: Insufficient documentation

## 2020-03-12 LAB — CBC
HCT: 36.1 % (ref 36.0–46.0)
Hemoglobin: 11.1 g/dL — ABNORMAL LOW (ref 12.0–15.0)
MCH: 29.5 pg (ref 26.0–34.0)
MCHC: 30.7 g/dL (ref 30.0–36.0)
MCV: 96 fL (ref 80.0–100.0)
Platelets: 155 10*3/uL (ref 150–400)
RBC: 3.76 MIL/uL — ABNORMAL LOW (ref 3.87–5.11)
RDW: 12.8 % (ref 11.5–15.5)
WBC: 4.2 10*3/uL (ref 4.0–10.5)
nRBC: 0 % (ref 0.0–0.2)

## 2020-03-12 LAB — BASIC METABOLIC PANEL
Anion gap: 8 (ref 5–15)
BUN: 20 mg/dL (ref 8–23)
CO2: 23 mmol/L (ref 22–32)
Calcium: 9 mg/dL (ref 8.9–10.3)
Chloride: 106 mmol/L (ref 98–111)
Creatinine, Ser: 1.08 mg/dL — ABNORMAL HIGH (ref 0.44–1.00)
GFR calc Af Amer: 55 mL/min — ABNORMAL LOW (ref >60)
GFR calc non Af Amer: 47 mL/min — ABNORMAL LOW (ref >60)
Glucose, Bld: 94 mg/dL (ref 70–99)
Potassium: 4 mmol/L (ref 3.5–5.1)
Sodium: 137 mmol/L (ref 135–145)

## 2020-03-12 MED ORDER — METOPROLOL SUCCINATE ER 50 MG PO TB24: 50.0000 mg | ORAL_TABLET | Freq: Two times a day (BID) | ORAL | 3 refills | Status: DC

## 2020-03-12 NOTE — Patient Instructions (Addendum)
Labs done today. We will contact you only if your labs are abnormal.  INCREASE Metoprolol XL 50mg (1 tablet) by mouth twice daily.  No other medication changes were made. Please continue all other medications as prescribed.  Your physician recommends that you schedule a follow-up appointment in: 4 months. Our office will contact you at a later time to schedule an appointment.  Your husbands appointment was scheduled for Tuesday, July 6th at 10:40am  If you have any questions or concerns before your next appointment please send [place] a message through [place] or call our office at [phone].    TO LEAVE A MESSAGE FOR THE NURSE SELECT OPTION 2, PLEASE LEAVE A MESSAGE INCLUDING: . YOUR NAME . DATE OF BIRTH . CALL BACK NUMBER . REASON FOR CALL**this is important as we prioritize the call backs  [facility] AS LONG AS YOU CALL BEFORE 4:00 PM  At the [facility], you and your health needs are our priority. As part of our continuing mission to provide you with exceptional heart care, we have created designated Provider Care Teams. These Care Teams include your primary Cardiologist (physician) and Advanced Practice Providers (APPs- Physician Assistants and Nurse Practitioners) who all work together to provide you with the care you need, when you need it.   You may see any of the following providers on your designated Care Team at your next follow up: Marland Kitchen Dr Glori Bickers . Dr [name] . Darrick Grinder, NP . Lyda Jester, PA . Audry Riles, PharmD   Please be sure to bring in all your medications bottles to every appointment.

## 2020-03-12 NOTE — Progress Notes (Signed)
Patient ID: Kim [name], female   DOB: [birth year], 85 y.o.   MRN: [id number] PCP: Dr. Sarajane Jews Oncologist: Dr. Marin Olp Cardiology: Dr. Aundra Dubin  85 y.o. with history of CAD, HCM, paroxysmal atrial fibrillation, and mitral regurgitation presents for followup of HCM, CHF, MR.  Patient had breast cancer in 2015 and received treatment involving Herceptin.  During breast cancer treatment, she developed unstable angina and ended up getting a DES to the mid RCA in 3/15.  Patient additionally has a history of HOCM.  This has been recognized on prior echoes.  She has severe asymmetric basal septal hypertrophy and SAM with LVOT gradient peak 58 mmHg on echo in 3/15 along with moderate MR.  Repeat echo in 7/15 showed LVOT gradient down to 30 mmHg on higher beta blocker.  Repeat echo in 11/15 showed no significant LVOT gradient but SAM still present.  Echo (8/16) showed asymmetric septal hypertrophy, No SAM, no significant LVOT gradient.  Echo 4/18 showed EF 55-60%, small LVOT gradient with severe asymmetric septal hypertrophy, mild MR, PASP 32 mmHg.    She was admitted in 1/20 with symptomatic atrial fibrillation with RVR, this was a new diagnosis.  She was started on Eliquis and cardioverted after TEE back to NSR.  TEE showed severe mitral regurgitation that appeared to be due mostly to posterior leaflet prolapse, there was minimal systolic anterior motion.   In 3/20, she had Mitraclip placement. Echo in 6/20 showed EF 60-65%, severe asymmetric septal hypertrophy, normal RV, s/p Mitraclip with mean gradient 3 mmHg and trivial MR.  Echo in 3/21 showed EF 65%, asymmetric septal hypertrophy with LVOT gradient 37 mmHg, SAM of clipped segment of the mitral valve, normal RV, severe LAE, s/p Mitraclip x 2 with mild MR/no MS.   She is symptomatically stable.  Main complaint continues to be low back pain and sciatica.  No falls.  No significant exertional dyspnea. No chest pain. No lightheadedness.   ECG (personally reviewed):  NSR, 1st degree AVB, anteroseptal Qs  Labs (3/15): K 4.5, creatinine 0.84, LDL 91, HDL 46 Labs (5/15): K 3.9, creatinine 1.1 Labs (12/15): LDL 80, HDL 28, hemoglobin 10.9 Labs (1/16): K 3.7, creatinine 0.8 Labs (2/16): K 3.6, creatinine 1.2, HCT 34.4 Labs (6/16): K 4.1, creatinine 1.24 Labs (7/17): K 4.1, creatinine 1.2, HCT 38.8, LDL 143 Labs (1/18): LDL 142 Labs (5/18): K 4.4, creatinine 1.0 Labs (1/20): K 3.9, creatinine 1.27, LDL 95 Labs (8/20): K 3.8, creatinine 2.06, LFTs normal Labs (12/20): K 4.1, creatinine 1.14 Labs (2/21): LDL 121 Labs (3/21): K 4.4, creatinine 1.27  PMH: 1. CAD: Unstable angina 3/15 with LHC showing 99% mRCA stenosis, treated with DES to mRCA.  2. Hypothyroidism 3. Diverticulosis 4. HTN 5. H/o TAH/BSO 6. Breast cancer: s/p lumpectomy with lymph node biopsy in 2/15.  4/10 nodes positive.  She was started on docetaxol/carboplatin/Herceptin in 3/15 with plan for 6 cycles chemo.  7. Hypertrophic obstructive cardiomyopathy: Echo (3/15) with severe focal basal septal hypertrophy (22 mm), narrow LV outflow tract with mitral valve SAM and 58 mmHg peak LVOT resting gradient, EF 60-65%, moderate MR, moderate LAE, normal RV, lateral s' 10.4 cm/sec.  Patient says that her 2 grown sons has had echoes to screen for HOCM.  Echo (4/15) with EF 65-70%, severe focal basal septal hypertrophy, LVOT gradient 33 mmHg, SAM was present with moderate MR, normal RV size and systolic function, lateral S' 10.6, GLS -17.5%.  Cardiac MRI (5/15) with EF 65%, moderate asymmetric septal hypertrophy, systolic anterior motion of  the mitral valve with moderate MR, there was no delayed enhancement.  Echo (7/15) with EF 65-70%, moderate ASH, peak LVOT gradient 30 mmHg, SAM with moderate MR, moderate to severe LAE.  Echo (11/15) with EF 55-60%, GLS -15%, no significant LVOT gradient, systolic anterior motion of the mitral valve with mild MR, normal RV size and systolic function. Echo (4/14) with EF  55-60%, severe asymmetric septal hypertrophy, LVOT gradient 20 mmHg, mild-moderate MR, GLS -18%.  - Echo (8/16): EF 60-65%, asymmetric septal hypertrophy, no significant LV outflow tract gradient, mild MR.   - Echo (4/18): EF 55-60%, small LVOT gradient with severe asymmetric septal hypertrophy, mild MR, PASP 32 mmHg.   - TEE (1/20): EF 55-60%, moderate-severe asymmetric septal hypertrophy, no LVOT gradient, normal RV size and systolic function, severe LAE, severe MR with posterior MV leaflet prolapse and minimal SAM.   - Echo (6/20): EF 60-65%, severe asymmetric septal hypertrophy, normal RV, s/p Mitraclip with mean gradient 3 mmHg and trivial MR.  - Echo (3/21): EF 65%, asymmetric septal hypertrophy with LVOT gradient 37 mmHg, SAM of clipped segment of the mitral valve, normal RV, severe LAE, s/p Mitraclip x 2 with mild MR/no MS.  8. Hyperlipidemia: Myalgias with atorvastatin, myalgias with Repatha.  9. Atrial fibrillation: Paroxysmal, first noted in 1/20.  10. Mitral regurgitation: Appears severe on 1/20 TEE.  Has posterior leaflet prolapse with minimal SAM.  - Mitraclip 3/20.   SH: Married, 2 children, retired, nonsmoker.   FH: No family history of HOCM or sudden death.  There is a family history of CAD.   ROS: All systems reviewed and negative except as per HPI.    Current Outpatient Medications  Medication Sig Dispense Refill   apixaban (ELIQUIS) 5 MG TABS tablet Take 5 mg by mouth 2 (two) times daily.     Calcium Carbonate-Vitamin D (CALCIUM-D PO) Take 1 tablet by mouth daily.     diltiazem (CARDIZEM CD) 120 MG 24 hr capsule TAKE 1 CAPSULE BY MOUTH ONCE DAILY 90 capsule 0   exemestane (AROMASIN) 25 MG tablet Take 25 mg by mouth daily after breakfast.     ezetimibe (ZETIA) 10 MG tablet TAKE 1 TABLET BY MOUTH ONCE DAILY 90 tablet 0   furosemide (LASIX) 40 MG tablet Take 1 tablet (40 mg total) by mouth as needed. For weight 144 or greater 30 tablet 5   levothyroxine (SYNTHROID,  LEVOTHROID) 137 MCG tablet Take 1 tablet (137 mcg total) by mouth daily before breakfast. 90 tablet 3   metoprolol succinate (TOPROL-XL) 50 MG 24 hr tablet Take 1 tablet (50 mg total) by mouth 2 (two) times daily. Take with or immediately following a meal. 180 tablet 3   Multiple Vitamin (MULTIVITAMIN WITH MINERALS) TABS tablet Take 1 tablet by mouth daily.     potassium chloride (K-DUR) 10 MEQ tablet Take 2 tablets (20 mEq total) by mouth as needed. Only take 20 meq if you take lasix 60 tablet 5   No current facility-administered medications for this encounter.   BP 130/72    Pulse 61    Ht 5\' 8"  (1.727 m)    Wt 69.9 kg (154 lb)    LMP 10/19/1991    SpO2 98%    BMI 23.42 kg/m  General: NAD Neck: No JVD, no thyromegaly or thyroid nodule.  Lungs: Clear to auscultation bilaterally with normal respiratory effort. CV: Nondisplaced PMI.  Heart regular S1/S2, no S3/S4, 2/6 SEM RUSB.  No peripheral edema.  No carotid bruit.  Normal  pedal pulses.  Abdomen: Soft, nontender, no hepatosplenomegaly, no distention.  Skin: Intact without lesions or rashes.  Neurologic: Alert and oriented x 3.  Psych: Normal affect. Extremities: No clubbing or cyanosis.  HEENT: Normal.   Assessment/Plan: 1. CAD: Status post PCI for unstable angina in 3/15 with DES to [facility]. No chest pain.  - No ASA, taking Eliquis.    - Unable to tolerate statins or Repatha, she is on Zetia.  2. Hyperlipidemia: Myalgias with Crestor, Lipitor, and pravastatin.  Myalgias with Repatha.  She has tolerated Zetia 10 mg daily.   3. Hypertrophic obstructive cardiomyopathy: No family history of HOCM or sudden death (interestingly, her husband has HOCM). Cardiac MRI in 5/15 showed no delayed enhancement.  Echo in 3/21 (post-Mitraclip) showed asymmetric septal hypertrophy with LVOT gradient peak 37 mmHg, SAM of the clipped segment of the mitral valve with mild MR, no MS.  - Continue diltiazem CD 120 mg daily.  - Increase Toprol XL to 50 mg bid  with LVOT gradient and SAM.  - Per patient, her 2 sons have had screening echoes to look for hypertrophic cardiomyopathy and did not show signs of HOCM.    4. Atrial fibrillation: Noted initially in 1/20, suspect this was triggered by mitral regurgitation and HCM.  She was cardioverted to NSR.  She was on amiodarone for a few months, this was stopped after Mitraclip. She is in NSR today.  - Continue Eliquis. CBC today.  5. Chronic diastolic CHF: Suspect this is triggered by atrial fibrillation and mitral regurgitation.  She is now using Lasix as needed. She does not look volume overloaded.  6. Mitral regurgitation: I suspect that mitral regurgitation may have beenthe trigger for atrial fibrillation.  I think the mechanism of MR was most likely mitral valve prolapse.She has now had Mitraclip placement, echo in 3/21 showed mild MR, no significant mitral stenosis.   Followup in 4 months     [name] 03/12/2020

## 2020-03-27 DIAGNOSIS — M961 Postlaminectomy syndrome, not elsewhere classified: Secondary | ICD-10-CM | POA: Diagnosis not present

## 2020-03-27 DIAGNOSIS — M5416 Radiculopathy, lumbar region: Secondary | ICD-10-CM | POA: Diagnosis not present

## 2020-04-08 ENCOUNTER — Other Ambulatory Visit (HOSPITAL_COMMUNITY): Payer: Self-pay

## 2020-04-08 MED ORDER — FUROSEMIDE 40 MG PO TABS: 40.0000 mg | ORAL_TABLET | ORAL | 5 refills | Status: DC | PRN

## 2020-04-18 DIAGNOSIS — M545 Low back pain: Secondary | ICD-10-CM | POA: Diagnosis not present

## 2020-05-01 ENCOUNTER — Other Ambulatory Visit (HOSPITAL_COMMUNITY): Payer: Self-pay | Admitting: Cardiology

## 2020-05-01 ENCOUNTER — Other Ambulatory Visit: Payer: Self-pay | Admitting: Family

## 2020-05-01 DIAGNOSIS — D0510 Intraductal carcinoma in situ of unspecified breast: Secondary | ICD-10-CM

## 2020-05-01 DIAGNOSIS — Z170 Estrogen receptor positive status [ER+]: Secondary | ICD-10-CM

## 2020-05-01 DIAGNOSIS — C50012 Malignant neoplasm of nipple and areola, left female breast: Secondary | ICD-10-CM

## 2020-05-01 DIAGNOSIS — C50412 Malignant neoplasm of upper-outer quadrant of left female breast: Secondary | ICD-10-CM

## 2020-05-19 ENCOUNTER — Inpatient Hospital Stay: Payer: Medicare PPO | Attending: Hematology & Oncology

## 2020-05-19 ENCOUNTER — Inpatient Hospital Stay: Payer: Medicare PPO

## 2020-05-19 ENCOUNTER — Telehealth: Payer: Self-pay | Admitting: Hematology & Oncology

## 2020-05-19 ENCOUNTER — Other Ambulatory Visit: Payer: Self-pay

## 2020-05-19 ENCOUNTER — Encounter: Payer: Self-pay | Admitting: Hematology & Oncology

## 2020-05-19 ENCOUNTER — Inpatient Hospital Stay (HOSPITAL_BASED_OUTPATIENT_CLINIC_OR_DEPARTMENT_OTHER): Payer: Medicare PPO | Admitting: Hematology & Oncology

## 2020-05-19 VITALS — BP 106/63 | HR 74 | Temp 98.0°F | Resp 16 | Wt 149.0 lb

## 2020-05-19 DIAGNOSIS — C50412 Malignant neoplasm of upper-outer quadrant of left female breast: Secondary | ICD-10-CM

## 2020-05-19 DIAGNOSIS — D500 Iron deficiency anemia secondary to blood loss (chronic): Secondary | ICD-10-CM

## 2020-05-19 DIAGNOSIS — C50912 Malignant neoplasm of unspecified site of left female breast: Secondary | ICD-10-CM | POA: Insufficient documentation

## 2020-05-19 DIAGNOSIS — Z170 Estrogen receptor positive status [ER+]: Secondary | ICD-10-CM | POA: Insufficient documentation

## 2020-05-19 DIAGNOSIS — M818 Other osteoporosis without current pathological fracture: Secondary | ICD-10-CM

## 2020-05-19 DIAGNOSIS — Z79811 Long term (current) use of aromatase inhibitors: Secondary | ICD-10-CM | POA: Diagnosis not present

## 2020-05-19 DIAGNOSIS — C50012 Malignant neoplasm of nipple and areola, left female breast: Secondary | ICD-10-CM

## 2020-05-19 LAB — CBC WITH DIFFERENTIAL (CANCER CENTER ONLY)
Abs Immature Granulocytes: 0.01 10*3/uL (ref 0.00–0.07)
Basophils Absolute: 0 10*3/uL (ref 0.0–0.1)
Basophils Relative: 1 %
Eosinophils Absolute: 0.1 10*3/uL (ref 0.0–0.5)
Eosinophils Relative: 2 %
HCT: 40.2 % (ref 36.0–46.0)
Hemoglobin: 12.2 g/dL (ref 12.0–15.0)
Immature Granulocytes: 0 %
Lymphocytes Relative: 20 %
Lymphs Abs: 1.2 10*3/uL (ref 0.7–4.0)
MCH: 27.4 pg (ref 26.0–34.0)
MCHC: 30.3 g/dL (ref 30.0–36.0)
MCV: 90.1 fL (ref 80.0–100.0)
Monocytes Absolute: 0.5 10*3/uL (ref 0.1–1.0)
Monocytes Relative: 9 %
Neutro Abs: 4 10*3/uL (ref 1.7–7.7)
Neutrophils Relative %: 68 %
Platelet Count: 188 10*3/uL (ref 150–400)
RBC: 4.46 MIL/uL (ref 3.87–5.11)
RDW: 13.2 % (ref 11.5–15.5)
WBC Count: 5.9 10*3/uL (ref 4.0–10.5)
nRBC: 0 % (ref 0.0–0.2)

## 2020-05-19 LAB — CMP (CANCER CENTER ONLY)
ALT: 6 U/L (ref 0–44)
AST: 10 U/L — ABNORMAL LOW (ref 15–41)
Albumin: 4.5 g/dL (ref 3.5–5.0)
Alkaline Phosphatase: 47 U/L (ref 38–126)
Anion gap: 8 (ref 5–15)
BUN: 28 mg/dL — ABNORMAL HIGH (ref 8–23)
CO2: 30 mmol/L (ref 22–32)
Calcium: 9.8 mg/dL (ref 8.9–10.3)
Chloride: 101 mmol/L (ref 98–111)
Creatinine: 1.71 mg/dL — ABNORMAL HIGH (ref 0.44–1.00)
GFR, Est AFR Am: 32 mL/min — ABNORMAL LOW (ref >60)
GFR, Estimated: 27 mL/min — ABNORMAL LOW (ref >60)
Glucose, Bld: 102 mg/dL — ABNORMAL HIGH (ref 70–99)
Potassium: 4.1 mmol/L (ref 3.5–5.1)
Sodium: 139 mmol/L (ref 135–145)
Total Bilirubin: 0.7 mg/dL (ref 0.3–1.2)
Total Protein: 6.7 g/dL (ref 6.5–8.1)

## 2020-05-19 LAB — IRON AND TIBC
Iron: 68 ug/dL (ref 41–142)
Saturation Ratios: 16 % — ABNORMAL LOW (ref 21–57)
TIBC: 415 ug/dL (ref 236–444)
UIBC: 347 ug/dL (ref 120–384)

## 2020-05-19 LAB — FERRITIN: Ferritin: 20 ng/mL (ref 11–307)

## 2020-05-19 MED ORDER — DENOSUMAB 60 MG/ML ~~LOC~~ SOSY
60.0000 mg | PREFILLED_SYRINGE | Freq: Once | SUBCUTANEOUS | Status: AC
  Administered 2020-05-19: 60 mg via SUBCUTANEOUS

## 2020-05-19 MED ORDER — DENOSUMAB 60 MG/ML ~~LOC~~ SOSY
PREFILLED_SYRINGE | SUBCUTANEOUS | Status: AC
  Filled 2020-05-19: qty 1

## 2020-05-19 NOTE — Patient Instructions (Signed)
Denosumab injection °What is this medicine? °DENOSUMAB (den oh sue mab) slows bone breakdown. Prolia is used to treat osteoporosis in women after menopause and in men, and in people who are taking corticosteroids for 6 months or more. Xgeva is used to treat a high calcium level due to cancer and to prevent bone fractures and other bone problems caused by multiple myeloma or cancer bone metastases. Xgeva is also used to treat giant cell tumor of the bone. °This medicine may be used for other purposes; ask your health care provider or pharmacist if you have questions. °COMMON BRAND NAME(S): Prolia, XGEVA °What should I tell my health care provider before I take this medicine? °They need to know if you have any of these conditions: °· dental disease °· having surgery or tooth extraction °· infection °· kidney disease °· low levels of calcium or Vitamin D in the blood °· malnutrition °· on hemodialysis °· skin conditions or sensitivity °· thyroid or parathyroid disease °· an unusual reaction to denosumab, other medicines, foods, dyes, or preservatives °· pregnant or trying to get pregnant °· breast-feeding °How should I use this medicine? °This medicine is for injection under the skin. It is given by a health care professional in a hospital or clinic setting. °A special MedGuide will be given to you before each treatment. Be sure to read this information carefully each time. °For Prolia, talk to your pediatrician regarding the use of this medicine in children. Special care may be needed. For Xgeva, talk to your pediatrician regarding the use of this medicine in children. While this drug may be prescribed for children as young as 13 years for selected conditions, precautions do apply. °Overdosage: If you think you have taken too much of this medicine contact a poison control center or emergency room at once. °NOTE: This medicine is only for you. Do not share this medicine with others. °What if I miss a dose? °It is  important not to miss your dose. Call your doctor or health care professional if you are unable to keep an appointment. °What may interact with this medicine? °Do not take this medicine with any of the following medications: °· other medicines containing denosumab °This medicine may also interact with the following medications: °· medicines that lower your chance of fighting infection °· steroid medicines like prednisone or cortisone °This list may not describe all possible interactions. Give your health care provider a list of all the medicines, herbs, non-prescription drugs, or dietary supplements you use. Also tell them if you smoke, drink alcohol, or use illegal drugs. Some items may interact with your medicine. °What should I watch for while using this medicine? °Visit your doctor or health care professional for regular checks on your progress. Your doctor or health care professional may order blood tests and other tests to see how you are doing. °Call your doctor or health care professional for advice if you get a fever, chills or sore throat, or other symptoms of a cold or flu. Do not treat yourself. This drug may decrease your body's ability to fight infection. Try to avoid being around people who are sick. °You should make sure you get enough calcium and vitamin D while you are taking this medicine, unless your doctor tells you not to. Discuss the foods you eat and the vitamins you take with your health care professional. °See your dentist regularly. Brush and floss your teeth as directed. Before you have any dental work done, tell your dentist you are   receiving this medicine. Do not become pregnant while taking this medicine or for 5 months after stopping it. Talk with your doctor or health care professional about your birth control options while taking this medicine. Women should inform their doctor if they wish to become pregnant or think they might be pregnant. There is a potential for serious side  effects to an unborn child. Talk to your health care professional or pharmacist for more information. What side effects may I notice from receiving this medicine? Side effects that you should report to your doctor or health care professional as soon as possible:  allergic reactions like skin rash, itching or hives, swelling of the face, lips, or tongue  bone pain  breathing problems  dizziness  jaw pain, especially after dental work  redness, blistering, peeling of the skin  signs and symptoms of infection like fever or chills; cough; sore throat; pain or trouble passing urine  signs of low calcium like fast heartbeat, muscle cramps or muscle pain; pain, tingling, numbness in the hands or feet; seizures  unusual bleeding or bruising  unusually weak or tired Side effects that usually do not require medical attention (report to your doctor or health care professional if they continue or are bothersome):  constipation  diarrhea  headache  joint pain  loss of appetite  muscle pain  runny nose  tiredness  upset stomach This list may not describe all possible side effects. Call your doctor for medical advice about side effects. You may report side effects to FDA at 1-800-FDA-1088. Where should I keep my medicine? This medicine is only given in a clinic, doctor's office, or other health care setting and will not be stored at home. NOTE: This sheet is a summary. It may not cover all possible information. If you have questions about this medicine, talk to your doctor, pharmacist, or health care provider.  2020 Elsevier/Gold Standard (2018-01-27 16:10:44)

## 2020-05-19 NOTE — Addendum Note (Signed)
Addended by: Burney Gauze R on: 05/19/2020 11:16 AM   Modules accepted: Orders

## 2020-05-19 NOTE — Telephone Encounter (Signed)
Appointments scheduled calendar printed per 8/16 los 

## 2020-05-19 NOTE — Progress Notes (Signed)
Hematology and Oncology Follow Up Visit  Kim [name] [id number] Nov 18, 1934 85 y.o. 05/19/2020   Principle Diagnosis:  Stage IIA (T1N1M0) adenocarcinoma of the left breast-triple positive Iron deficiency anemia secondary to blood loss  Current Therapy:   Status post cycle 4 of Abraxane/Herceptin Maintenance Herceptin q 3wk - finish 03/25/2015 Prolia 60 mg sq every 6 months - due in February 2022 Aromasin 25 mg by mouth daily  - start on 01/10/2017  Feraheme as needed-dose given today     Interim History:  [name] is back for followup.  Overall, she seems to be doing okay.  I think she might be getting a little bit of dementia.  Her husband is not doing well.  He has cardiac issues.  I suspect he probably has congestive heart failure.  She has had a pretty quiet summer.  They have not gone anywhere.  She has had no problems with pain.  Is been no problems with cough or shortness of breath.  She does have atrial fibrillation.  She is on chronic Eliquis for the atrial fibrillation.  There is been no change in bowel or bladder habits.  She has had no leg swelling.  She has had no rashes..  She has had 1 coronavirus vaccine.  She does not want another one.  She said that this 1 caused her have a lot of dizziness.  Overall, her performance status is ECOG 2.   Medications:  Current Outpatient Medications:  .  apixaban (ELIQUIS) 5 MG TABS tablet, Take 5 mg by mouth 2 (two) times daily., Disp: , Rfl:  .  Calcium Carbonate-Vitamin D (CALCIUM-D PO), Take 1 tablet by mouth daily., Disp: , Rfl:  .  diltiazem (CARDIZEM CD) 120 MG 24 hr capsule, TAKE 1 CAPSULE BY MOUTH ONCE DAILY, Disp: 30 capsule, Rfl: 3 .  exemestane (AROMASIN) 25 MG tablet, TAKE 1 TABLET BY MOUTH ONCE DAILY AFTER BREAKFAST, Disp: 90 tablet, Rfl: 3 .  ezetimibe (ZETIA) 10 MG tablet, TAKE 1 TABLET BY MOUTH ONCE DAILY, Disp: 90 tablet, Rfl: 0 .  furosemide (LASIX) 40 MG tablet, Take 1 tablet (40 mg total) by mouth as needed  for fluid or edema. For weight 144 or greater, Disp: 30 tablet, Rfl: 5 .  levothyroxine (SYNTHROID, LEVOTHROID) 137 MCG tablet, Take 1 tablet (137 mcg total) by mouth daily before breakfast., Disp: 90 tablet, Rfl: 3 .  metoprolol succinate (TOPROL-XL) 50 MG 24 hr tablet, Take 1 tablet (50 mg total) by mouth 2 (two) times daily. Take with or immediately following a meal., Disp: 180 tablet, Rfl: 3 .  Multiple Vitamin (MULTIVITAMIN WITH MINERALS) TABS tablet, Take 1 tablet by mouth daily., Disp: , Rfl:  .  potassium chloride (K-DUR) 10 MEQ tablet, Take 2 tablets (20 mEq total) by mouth as needed. Only take 20 meq if you take lasix, Disp: 60 tablet, Rfl: 5  Allergies:  Allergies  Allergen Reactions  . Penicillins Anaphylaxis and Swelling    FACIAL SWELLING PATIENT HAS HAD A PCN REACTION WITH IMMEDIATE RASH, FACIAL/TONGUE/THROAT SWELLING, SOB, OR LIGHTHEADEDNESS WITH HYPOTENSION:  #  #  YES  #  #  Has patient had a PCN reaction causing severe rash involving mucus membranes or skin necrosis: No Has patient had a PCN reaction that required hospitalization: No Has patient had a PCN reaction occurring within the last 10 years: No  . Propoxyphene N-Acetaminophen Swelling    tongue swelling  . Evolocumab Other (See Comments)    Myalgias, fatigue, some  itching and burning on her feet,  Arthralgia (Joint Pain)  . Levaquin [Levofloxacin] Hives  . Statins Other (See Comments)    Joint pain  . Cortisone Rash  . Myrbetriq [Mirabegron] Rash    Rash on face  . Pseudoephedrine Other (See Comments)    Facial flushing    Past Medical History, Surgical history, Social history, and Family History were reviewed and updated.  Review of Systems: Review of Systems  Constitutional: Negative.   HENT: Negative.   Eyes: Negative.   Respiratory: Negative.   Cardiovascular: Negative.   Gastrointestinal: Negative.   Genitourinary: Negative.   Musculoskeletal: Positive for back pain and joint pain.  Skin:  Negative.   Neurological: Positive for focal weakness.  Endo/Heme/Allergies: Negative.   Psychiatric/Behavioral: Negative.      Physical Exam:  weight is 149 lb (67.6 kg). Her oral temperature is 98 F (36.7 C). Her blood pressure is 106/63 and her pulse is 74. Her respiration is 16 and oxygen saturation is 97%.   Physical Exam Vitals reviewed.  HENT:     Head: Normocephalic and atraumatic.  Eyes:     Pupils: Pupils are equal, round, and reactive to light.  Cardiovascular:     Rate and Rhythm: Normal rate and regular rhythm.     Heart sounds: Normal heart sounds.  Pulmonary:     Effort: Pulmonary effort is normal.     Breath sounds: Normal breath sounds.  Abdominal:     General: Bowel sounds are normal.     Palpations: Abdomen is soft.  Musculoskeletal:        General: No tenderness or deformity. Normal range of motion.     Cervical back: Normal range of motion.  Lymphadenopathy:     Cervical: No cervical adenopathy.  Skin:    General: Skin is warm and dry.     Findings: No erythema or rash.  Neurological:     Mental Status: She is alert and oriented to person, place, and time.  Psychiatric:        Behavior: Behavior normal.        Thought Content: Thought content normal.        Judgment: Judgment normal.     Lab Results  Component Value Date   WBC 5.9 05/19/2020   HGB 12.2 05/19/2020   HCT 40.2 05/19/2020   MCV 90.1 05/19/2020   PLT 188 05/19/2020     Chemistry      Component Value Date/Time   NA 139 05/19/2020 0918   NA 140 06/09/2017 0758   NA 141 10/11/2016 0844   K 4.1 05/19/2020 0918   K 4.5 06/09/2017 0758   K 4.1 10/11/2016 0844   CL 101 05/19/2020 0918   CL 106 06/09/2017 0758   CO2 30 05/19/2020 0918   CO2 28 06/09/2017 0758   CO2 25 10/11/2016 0844   BUN 28 (H) 05/19/2020 0918   BUN 17 06/09/2017 0758   BUN 20.3 10/11/2016 0844   CREATININE 1.71 (H) 05/19/2020 0918   CREATININE 1.2 06/09/2017 0758   CREATININE 1.1 10/11/2016 0844       Component Value Date/Time   CALCIUM 9.8 05/19/2020 0918   CALCIUM 9.1 06/09/2017 0758   CALCIUM 9.5 10/11/2016 0844   ALKPHOS 47 05/19/2020 0918   ALKPHOS 49 06/09/2017 0758   ALKPHOS 52 10/11/2016 0844   AST 10 (L) 05/19/2020 0918   AST 13 10/11/2016 0844   ALT 6 05/19/2020 0918   ALT 19 06/09/2017 0758   ALT  10 10/11/2016 0844   BILITOT 0.7 05/19/2020 0918   BILITOT 0.83 10/11/2016 0844         Impression and Plan: Ms. Dearinger is 85 year-old white female with stage IIA ductal carcinoma of the left breast. She had 4 positive lymph nodes. Her breast cancer was triple positive. She completed her adjuvant chemotherapy. She completed this in July of 2015.  She then went on to complete adjuvant Herceptin in June 2016.  For right now, I do not see any problems with respect to recurrent breast cancer.  I know that she does have a risk of recurrence by the 4+ lymph nodes.  She will stay on the Aromasin.  I we will make sure that she gets her Prolia today.  Otherwise, we will plan to get her back in 6 more months.   [name], MD 8/16/202111:00 AM

## 2020-06-27 DIAGNOSIS — R300 Dysuria: Secondary | ICD-10-CM | POA: Diagnosis not present

## 2020-06-27 DIAGNOSIS — N390 Urinary tract infection, site not specified: Secondary | ICD-10-CM | POA: Diagnosis not present

## 2020-07-01 ENCOUNTER — Other Ambulatory Visit (HOSPITAL_COMMUNITY): Payer: Self-pay | Admitting: Cardiology

## 2020-07-07 ENCOUNTER — Other Ambulatory Visit: Payer: Self-pay

## 2020-07-07 ENCOUNTER — Emergency Department (HOSPITAL_COMMUNITY)
Admission: EM | Admit: 2020-07-07 | Discharge: 2020-07-07 | Disposition: A | Discharge status: Home / Self Care | Payer: Medicare PPO | Attending: Emergency Medicine | Admitting: Emergency Medicine

## 2020-07-07 ENCOUNTER — Telehealth (HOSPITAL_COMMUNITY): Payer: Self-pay | Admitting: *Deleted

## 2020-07-07 ENCOUNTER — Emergency Department (HOSPITAL_COMMUNITY): Payer: Medicare PPO

## 2020-07-07 DIAGNOSIS — Z79899 Other long term (current) drug therapy: Secondary | ICD-10-CM | POA: Insufficient documentation

## 2020-07-07 DIAGNOSIS — Z853 Personal history of malignant neoplasm of breast: Secondary | ICD-10-CM | POA: Diagnosis not present

## 2020-07-07 DIAGNOSIS — I2510 Atherosclerotic heart disease of native coronary artery without angina pectoris: Secondary | ICD-10-CM | POA: Insufficient documentation

## 2020-07-07 DIAGNOSIS — R079 Chest pain, unspecified: Secondary | ICD-10-CM

## 2020-07-07 DIAGNOSIS — Z96651 Presence of right artificial knee joint: Secondary | ICD-10-CM | POA: Diagnosis not present

## 2020-07-07 DIAGNOSIS — I10 Essential (primary) hypertension: Secondary | ICD-10-CM | POA: Insufficient documentation

## 2020-07-07 DIAGNOSIS — J90 Pleural effusion, not elsewhere classified: Secondary | ICD-10-CM | POA: Diagnosis not present

## 2020-07-07 DIAGNOSIS — R0789 Other chest pain: Secondary | ICD-10-CM | POA: Diagnosis not present

## 2020-07-07 DIAGNOSIS — R000 Tachycardia, unspecified: Secondary | ICD-10-CM | POA: Diagnosis not present

## 2020-07-07 DIAGNOSIS — E039 Hypothyroidism, unspecified: Secondary | ICD-10-CM | POA: Diagnosis not present

## 2020-07-07 LAB — CBC WITH DIFFERENTIAL/PLATELET
Abs Immature Granulocytes: 0.02 10*3/uL (ref 0.00–0.07)
Basophils Absolute: 0 10*3/uL (ref 0.0–0.1)
Basophils Relative: 1 %
Eosinophils Absolute: 0.1 10*3/uL (ref 0.0–0.5)
Eosinophils Relative: 1 %
HCT: 34.1 % — ABNORMAL LOW (ref 36.0–46.0)
Hemoglobin: 10.6 g/dL — ABNORMAL LOW (ref 12.0–15.0)
Immature Granulocytes: 0 %
Lymphocytes Relative: 21 %
Lymphs Abs: 1 10*3/uL (ref 0.7–4.0)
MCH: 28 pg (ref 26.0–34.0)
MCHC: 31.1 g/dL (ref 30.0–36.0)
MCV: 90 fL (ref 80.0–100.0)
Monocytes Absolute: 0.5 10*3/uL (ref 0.1–1.0)
Monocytes Relative: 10 %
Neutro Abs: 3.1 10*3/uL (ref 1.7–7.7)
Neutrophils Relative %: 67 %
Platelets: 153 10*3/uL (ref 150–400)
RBC: 3.79 MIL/uL — ABNORMAL LOW (ref 3.87–5.11)
RDW: 15.9 % — ABNORMAL HIGH (ref 11.5–15.5)
WBC: 4.7 10*3/uL (ref 4.0–10.5)
nRBC: 0 % (ref 0.0–0.2)

## 2020-07-07 LAB — BASIC METABOLIC PANEL
Anion gap: 10 (ref 5–15)
BUN: 30 mg/dL — ABNORMAL HIGH (ref 8–23)
CO2: 22 mmol/L (ref 22–32)
Calcium: 9.4 mg/dL (ref 8.9–10.3)
Chloride: 104 mmol/L (ref 98–111)
Creatinine, Ser: 1.69 mg/dL — ABNORMAL HIGH (ref 0.44–1.00)
GFR calc Af Amer: 32 mL/min — ABNORMAL LOW (ref >60)
GFR calc non Af Amer: 28 mL/min — ABNORMAL LOW (ref >60)
Glucose, Bld: 95 mg/dL (ref 70–99)
Potassium: 4.7 mmol/L (ref 3.5–5.1)
Sodium: 136 mmol/L (ref 135–145)

## 2020-07-07 LAB — TROPONIN I (HIGH SENSITIVITY)
Troponin I (High Sensitivity): 10 ng/L (ref <18)
Troponin I (High Sensitivity): 10 ng/L (ref <18)

## 2020-07-07 NOTE — ED Provider Notes (Signed)
  Face-to-face evaluation   History: Patient presents for evaluation of "heaviness," in her chest, she noticed today.  She took 3 nitroglycerin at home, with aspirin and EMS gave her another nitroglycerin.  Pain is improved since then.  No other recent problem.  She cannot recall the last time she took nitroglycerin.  She denies other problems including fever, cough, shortness of breath or dizziness.  Physical exam: Elderly female, alert cooperative.  Heart regular rate and rhythm without murmur.  Lungs clear anteriorly.  Chest nontender to palpation.  Abdomen soft and nontender.  Medical screening examination/treatment/procedure(s) were conducted as a shared visit with non-physician practitioner(s) and myself.  I personally evaluated the patient during the encounter    Daleen Bo, MD 07/08/20 [phone]

## 2020-07-07 NOTE — ED Provider Notes (Addendum)
[facility] EMERGENCY DEPARTMENT Provider Note   CSN: [id number] Arrival date & time: 07/07/20  1123    History Chest Pain   CANDIE GINTZ is a 85 y.o. female with past medical history significant for breast cancer in 2015, no active cancer, CAD, hypertension, hyperlipidemia, obstructive cardiomyopathy, A. fib with RVR (Eliquis) who presents for evaluation of chest pain.  Patient states she awoke at 7:00 this morning.  Patient states when she got up she felt pain to the center of her chest.  Felt like she cannot get comfortable.  No radiation to left arm, left jaw or back.  Nonexertional, nonpleuritic in nature.  She had no associated diaphoresis, nausea or vomiting.  Patient states she took a nitro, 81 mg aspirin at home which did not relieve the pain.  She called her cardiologist who told her to come to emergency department for evaluation.  Patient states she was given nitro by EMS and her pain is "I do not feel it anymore."  Denies fever, chills, nausea, vomiting, hemoptysis, abdominal pain, diarrhea, dysuria, lower extremity edema, redness, warmth, paresthesias.  Denies additional aggravating or alleviating factors.  History obtained from patient and past medical records.  No interpreter used.  HPI     Past Medical History:  Diagnosis Date  . Allergic rhinitis   . Allergy   . Arthritis    hands  . Breast cancer of upper-outer quadrant of left female breast ([place]) 11/08/13   finished chemo and radiation 2015  . CAD (coronary artery disease)    a. 12/2013 Cath/PCI: LM nl, LAD 20p, 43m 30d, D1 30p, LCX 20p, 322mOM1 20, Mo2 40p, RCA 30p, 9946m.0x16 Promus Premier DES), 20d, RPL 30, RPDA 70p, 52m24m 65-70%.  . Diverticulosis   . HOH (hard of hearing)    bilateral, wears hearing aids  . Hx of adenomatous colonic polyps 02/1999  . Hyperlipidemia   . Hypertension   . [facility] Dr. DaltLoralie Champagne Hypothyroidism   . Iron deficiency anemia due to chronic blood loss 01/07/2016  . Low back pain    gets ESI from Dr. Max Rennis HardingMalabsorption of iron 01/07/2016  . Myocardial infarction (HCC)[place]2015   very mild-with first chemo -placed stent x1  . Persistent atrial fibrillation with rapid ventricular response (HCC)[place]11/2020  . Personal history of radiation therapy 2015  . S/P radiation therapy 05/14/2014-06/26/2014   1) Left Breast  / 50 Gy in 25 fractions, 2) Left Supraclavicular fossa/ 47.5 Gy in 25 fractions, 3) Left Posterior Axillary boost / 4.825 Gy in 25 fractions, 4) Left Breast boost / 10 Gy in 5 fractions   . Tubulovillous adenoma of colon 12/2009   with HGD  . Wears hearing aid    left and right     Patient Active Problem List   Diagnosis Date Noted  . Severe mitral regurgitation 12/06/2018  . Persistent atrial fibrillation with rapid ventricular response (HCC)[place]/08/2019  . HNP (herniated nucleus pulposus), lumbar 03/01/2018  . History of total knee replacement, right 11/13/2017  . OA (osteoarthritis) of knee 10/24/2017  . Chronic pain of right knee 06/17/2017  . Osteoporosis 07/30/2016  . Iron deficiency anemia due to chronic blood loss 01/07/2016  . Malabsorption of iron 01/07/2016  . Postoperative state 01/17/2015  . HOCM (hypertrophic obstructive cardiomyopathy) (HCC)[place]/05/2014  . Breast cancer (HCC)[place]/05/2014  . Coronary atherosclerosis of native coronary artery 12/20/2013  .  CAD (coronary artery disease)   . Hyperlipidemia   . Breast cancer of upper-outer quadrant of left female breast ([place]) 11/05/2013  . DCIS (ductal carcinoma in situ) of breast 10/26/2013  . Hypertension 04/05/2012  . UNSPECIFIED HEARING LOSS 08/07/2010  . LOW BACK PAIN 04/22/2009  . CONSTIPATION 11/04/2008  . COLONIC POLYPS, HX OF 11/01/2008  . ALLERGIC RHINITIS 06/27/2007  . Hypothyroidism 06/22/2007    Past Surgical History:  Procedure Laterality Date  . ABDOMINAL HYSTERECTOMY    .  ANTERIOR AND POSTERIOR REPAIR N/A 01/17/2015   Procedure: CYSTOCELE REPAIR ;  Surgeon: Princess Bruins, MD;  Location: [facility];  Service: Gynecology;  Laterality: N/A;  . BILATERAL SALPINGOOPHORECTOMY     see Laurin Coder NP for GYN exams  . BREAST BIOPSY Left 10/18/2013  . BREAST BIOPSY Left 10/31/2013  . BREAST LUMPECTOMY Left 11/05/2013  . BREAST LUMPECTOMY WITH NEEDLE LOCALIZATION AND AXILLARY LYMPH NODE DISSECTION Left 11/05/2013   Procedure: LEFT BREAST LUMPECTOMY WITH NEEDLE LOCALIZATION and axillary lymph Node Dissection;  Surgeon: Edward Jolly, MD;  Location: [facility];  Service: General;  Laterality: Left;  . BREAST SURGERY  11/2013   left lumpectomy  . CARDIOVERSION N/A 10/17/2018   Procedure: CARDIOVERSION;  Surgeon: Larey Dresser, MD;  Location: [facility] ENDOSCOPY;  Service: Cardiovascular;  Laterality: N/A;  . CARDIOVERSION N/A 11/08/2018   Procedure: CARDIOVERSION;  Surgeon: Larey Dresser, MD;  Location: [facility];  Service: Cardiovascular;  Laterality: N/A;  . CATARACT EXTRACTION W/ INTRAOCULAR LENS  IMPLANT, BILATERAL  2012   bilateral  . COLONOSCOPY  11/04/2015   per Dr. Fuller Plan, adenomatous polyps, no repeats planned   . EYE SURGERY  11/22/2006   cataracts, bilateral, intraocular lens implant  . JOINT REPLACEMENT     2019  . LEFT HEART CATHETERIZATION WITH CORONARY ANGIOGRAM N/A 12/19/2013   Procedure: LEFT HEART CATHETERIZATION WITH CORONARY ANGIOGRAM;  Surgeon: Burnell Blanks, MD;  Location[facility] CATH LAB;  Service: Cardiovascular;  Laterality: N/A;  . lumbar epidural steroid injection     lumber spinal stenosis  . LUMBAR LAMINECTOMY/DECOMPRESSION MICRODISCECTOMY Right 03/01/2018   Procedure: RIGHT LUMBAR FOUR- LUMBAR FIVE LAMINECTOMY/MICRODISCECTOMY;  Surgeon: [name], MD;  Location: [facility];  Service: Neurosurgery;  Laterality: Right;  . MITRAL VALVE REPAIR N/A 12/06/2018   Procedure: MITRAL VALVE REPAIR;  Surgeon: Sherren Mocha, MD;   Location: [facility] CV LAB;  Service: Cardiovascular;  Laterality: N/A;  . POLYPECTOMY    . PORTACATH PLACEMENT Right 12/11/2013   Procedure: INSERTION PORT-A-CATH;  Surgeon: Edward Jolly, MD;  Location: [facility];  Service: General;  Laterality: Right;  Subclavian Vein;   . RIGHT/LEFT HEART CATH AND CORONARY ANGIOGRAPHY N/A 11/03/2018   Procedure: RIGHT/LEFT HEART CATH AND CORONARY ANGIOGRAPHY;  Surgeon: Larey Dresser, MD;  Location: [facility] CV LAB;  Service: Cardiovascular;  Laterality: N/A;  . TEE WITHOUT CARDIOVERSION N/A 10/17/2018   Procedure: TRANSESOPHAGEAL ECHOCARDIOGRAM (TEE);  Surgeon: Larey Dresser, MD;  Location: [facility] ENDOSCOPY;  Service: Cardiovascular;  Laterality: N/A;  . TEE WITHOUT CARDIOVERSION N/A 11/08/2018   Procedure: TRANSESOPHAGEAL ECHOCARDIOGRAM (TEE);  Surgeon: Larey Dresser, MD;  Location[facility];  Service: Cardiovascular;  Laterality: N/A;  . TOTAL KNEE ARTHROPLASTY Right 10/24/2017   Procedure: TOTAL RIGHT KNEE ARTHROPLASTY;  Surgeon: Gaynelle Arabian, MD;  Location: [facility];  Service: Orthopedics;  Laterality: Right;     OB History    Gravida  2   Para  2   Term  Preterm      AB      Living        SAB      TAB      Ectopic      Multiple      Live Births           Obstetric Comments  Menarche age [age], parity age 72, G72,P2,  No BC, HRT x 20 years        Family History  Problem Relation Age of Onset  . Alcohol abuse Father   . Kidney failure Mother   . Colon cancer Neg Hx   . Rectal cancer Neg Hx   . Stomach cancer Neg Hx   . Colon polyps Neg Hx     Social History   Tobacco Use  . Smoking status: Never Smoker  . Smokeless tobacco: Never Used  . Tobacco comment: never used tobacco  Vaping Use  . Vaping Use: Never used  Substance Use Topics  . Alcohol use: No    Alcohol/week: 0.0 standard drinks  . Drug use: No    Home Medications Prior to Admission medications   Medication Sig  Start Date End Date Taking? Authorizing Provider  Calcium Carbonate-Vitamin D (CALCIUM-D PO) Take 1 tablet by mouth daily.    [provider]  diltiazem (CARDIZEM CD) 120 MG 24 hr capsule TAKE 1 CAPSULE BY MOUTH ONCE DAILY 05/01/20   Larey Dresser, MD  ELIQUIS 5 MG TABS tablet TAKE 1 TABLET BY MOUTH TWICE (2) DAILY 07/01/20   Larey Dresser, MD  exemestane (AROMASIN) 25 MG tablet TAKE 1 TABLET BY MOUTH ONCE DAILY AFTER BREAKFAST 05/01/20   [name], MD  ezetimibe (ZETIA) 10 MG tablet TAKE 1 TABLET BY MOUTH ONCE DAILY 02/08/20   Larey Dresser, MD  furosemide (LASIX) 40 MG tablet Take 1 tablet (40 mg total) by mouth as needed for fluid or edema. For weight 144 or greater 04/08/20   Larey Dresser, MD  levothyroxine (SYNTHROID, LEVOTHROID) 137 MCG tablet Take 1 tablet (137 mcg total) by mouth daily before breakfast. 08/08/18   Laurey Morale, MD  metoprolol succinate (TOPROL-XL) 50 MG 24 hr tablet Take 1 tablet (50 mg total) by mouth 2 (two) times daily. Take with or immediately following a meal. 03/12/20   Larey Dresser, MD  Multiple Vitamin (MULTIVITAMIN WITH MINERALS) TABS tablet Take 1 tablet by mouth daily.    [provider]  potassium chloride (K-DUR) 10 MEQ tablet Take 2 tablets (20 mEq total) by mouth as needed. Only take 20 meq if you take lasix 01/15/19   Clegg, Amy D, NP  ferrous sulfate 325 (65 FE) MG tablet Take 325 mg by mouth 2 (two) times daily.    01/16/19  [provider]  pravastatin (PRAVACHOL) 80 MG tablet Take 1 tablet (80 mg total) by mouth every evening. 11/20/15 01/16/19  Larey Dresser, MD    Allergies    Penicillins, Propoxyphene n-acetaminophen, Evolocumab, Levaquin [levofloxacin], Statins, Cortisone, Myrbetriq [mirabegron], and Pseudoephedrine  Review of Systems   Review of Systems  Constitutional: Negative.   HENT: Negative.   Respiratory: Negative.   Cardiovascular: Positive for chest pain. Negative for palpitations and leg  swelling.  Gastrointestinal: Negative.   Genitourinary: Negative.   Musculoskeletal: Negative.   Skin: Negative.   Neurological: Negative.   All other systems reviewed and are negative.   Physical Exam Updated Vital Signs BP 116/90   Pulse (!) 106  Temp 98 F (36.7 C) (Oral)   Resp 18   LMP 10/19/1991   SpO2 92%   Physical Exam Vitals and nursing note reviewed.  Constitutional:      General: She is not in acute distress.    Appearance: She is well-developed. She is not ill-appearing, toxic-appearing or diaphoretic.     Comments: Pleasant elderly female  HENT:     Head: Normocephalic and atraumatic.     Mouth/Throat:     Mouth: Mucous membranes are moist.  Eyes:     Pupils: Pupils are equal, round, and reactive to light.  Cardiovascular:     Rate and Rhythm: Normal rate.     Pulses: Normal pulses.          Radial pulses are 2+ on the right side and 2+ on the left side.       Dorsalis pedis pulses are 2+ on the right side and 2+ on the left side.       Posterior tibial pulses are 2+ on the right side and 2+ on the left side.     Heart sounds: Normal heart sounds.  Pulmonary:     Effort: Pulmonary effort is normal. No respiratory distress.     Breath sounds: Normal breath sounds and air entry.     Comments: Speaks in full sentences without difficulty.  Lungs clear to auscultation Chest:     Comments: Equal rise and fall to chest Abdominal:     General: Bowel sounds are normal. There is no distension.     Palpations: Abdomen is soft.     Tenderness: There is no abdominal tenderness. There is no right CVA tenderness, left CVA tenderness, guarding or rebound.     Hernia: No hernia is present.     Comments: Abdomen soft, nontender, no midline pulsatile abdominal mass.  Musculoskeletal:        General: No swelling, tenderness, deformity or signs of injury. Normal range of motion.     Cervical back: Normal range of motion.     Right lower leg: No tenderness. No edema.      Left lower leg: No tenderness. No edema.     Comments: Moves all 4 extremities at difficulty.  No bony tenderness.  Compartments soft  Skin:    General: Skin is warm and dry.     Capillary Refill: Capillary refill takes less than 2 seconds.     Comments: No edema, erythema or warmth.  No fluctuance or induration.  Neurological:     General: No focal deficit present.     Mental Status: She is alert.     Comments: Cranial nerves II through XII grossly intact.  No facial droop.    ED Results / Procedures / Treatments   Labs (all labs ordered are listed, but only abnormal results are displayed) Labs Reviewed  CBC WITH DIFFERENTIAL/PLATELET - Abnormal; Notable for the following components:      Result Value   RBC 3.79 (*)    Hemoglobin 10.6 (*)    HCT 34.1 (*)    RDW 15.9 (*)    All other components within normal limits  BASIC METABOLIC PANEL - Abnormal; Notable for the following components:   BUN 30 (*)    Creatinine, Ser 1.69 (*)    GFR calc non Af Amer 28 (*)    GFR calc Af Amer 32 (*)    All other components within normal limits  TROPONIN I (HIGH SENSITIVITY)  TROPONIN I (HIGH SENSITIVITY)  EKG None    Radiology DG Chest 2 View  Result Date: 07/07/2020 CLINICAL DATA:  Chest pain. EXAM: CHEST - 2 VIEW COMPARISON:  September 12, 2019. FINDINGS: Stable cardiomediastinal silhouette. No pneumothorax or pleural effusion is noted. Both lungs are clear. The visualized skeletal structures are unremarkable. IMPRESSION: No active cardiopulmonary disease. Electronically Signed   By: Marijo Conception M.D.   On: 07/07/2020 12:06    Procedures Procedures (including critical care time)  Medications Ordered in ED Medications - No data to display  ED Course  I have reviewed the triage vital signs and the nursing notes.  Pertinent labs & imaging results that were available during my care of the patient were reviewed by me and considered in my medical decision making (see chart for  details).  85 year old female appears otherwise well presents for evaluation of chest pain.  Episode of 5 minutes of chest pain earlier this morning.  No radiation.  No associated diaphoresis, shortness of breath, nausea, vomiting or hematuria.  Compartments soft.  No bony tenderness.  No exertional chest pain.  Her chest pain is not pleuritic.  She has been taking her medications as prescribed.  She has been chest pain-free for 6 hours while here in the emergency department.  She has had any chest pain while performing her ADLs at home.  She is compliant with her home medications.  She is followed by Dr. Aundra Dubin with: Cardiology.  Pain located to right side of chest.  Described as a dull ache and sharp pain.  Worse with movement.  Cannot get comfortable at home.  No back pain, abdominal pain.  Was given nitro by EMS however did have a drop in her blood pressure to 97 systolic.  They gave her 500 fluid bolus with return of normal blood pressure.  On arrival to the emergency department patient denies any current pain.  She is compliant with her Eliquis.  Plan on labs, imaging and reassess  Labs and imaging personally reviewed and interpreted:  CBC without leukocytosis, hemoglobin 10.6 at baseline Metabolic panel with creatinine 1.69 at baseline Delta troponin flat at 10 EKG without ischemic changes.  Not pulling over into epic.  She does have atrial fibrillation without RVR.  This is chronic.  See media tab. Chest x-ray without acute infiltrates, cardiomegaly, pulmonary MAC, pneumothorax  Heart score 3.  Wells criteria low risk.  Patient reassessed.  She has not had any chest pain, shortness of breath here in the emergency department.  Patient reassessed.  She is walking around her room, drinking ginger ale and eating crackers.  Requesting discharge home.  CONSULT Dr. Marigene Ehlers her cardiologist.  Discussed patient's work-up thus far here in the emergency department.  He is comfortable with patient  following up closely outpatient.  Patient will follow up with cardiology outpatient.  She will return here to emergency department for any worsening symptoms.  I discussed this with patient and daughter in room.  Patient is to be discharged with recommendation to follow up with PCP in regards to today's hospital visit. VSS, no tracheal deviation, no JVD or new murmur, RRR, breath sounds equal bilaterally, EKG without acute abnormalities, negative troponin, and negative CXR. Pt has been advised to return to the ED if CP becomes exertional, associated with diaphoresis or nausea, radiates to left jaw/arm, worsens or becomes concerning in any way. Pt appears reliable for follow up and is agreeable to discharge.  Low suspicion for acute unstable angina, myocardial infarct, PE, dissection, tamponade, bacterial  infectious process, pneumothorax.  Patient seen eval by attending, Dr. Eulis Foster.  He is agreeable treatment, plan and  Disposition.  The patient has been appropriately medically screened and/or stabilized in the ED. I have low suspicion for any other emergent medical condition which would require further screening, evaluation or treatment in the ED or require inpatient management.  Patient is hemodynamically stable and in no acute distress.  Patient able to ambulate in department prior to ED.  Evaluation does not show acute pathology that would require ongoing or additional emergent interventions while in the emergency department or further inpatient treatment.  I have discussed the diagnosis with the patient and answered all questions.  Pain is been managed while in the emergency department and patient has no further complaints prior to discharge.  Patient is comfortable with plan discussed in room and is stable for discharge at this time.  I have discussed strict return precautions for returning to the emergency department.  Patient was encouraged to follow-up with PCP/specialist refer to at discharge.     MDM Rules/Calculators/A&P                           Final Clinical Impression(s) / ED Diagnoses Final diagnoses:  Nonspecific chest pain    Rx / DC Orders ED Discharge Orders    None       Patina Spanier A, PA-C 07/07/20 1714    Zanaiya Calabria A, PA-C 07/07/20 1715    Daleen Bo, MD 07/08/20 1636

## 2020-07-07 NOTE — Discharge Instructions (Signed)
Follow up with Cardiology.  Return for new or worsening symptoms 

## 2020-07-07 NOTE — ED Triage Notes (Signed)
Patient arrives per EMS because of sharp chest pain that started around 7am this day that started in the right chest and radiated to the left chest. Patient took aspirin and nitro at home. Per EMS they administered one of nitro and patient's pressures dropped to 97 SBP so they held remaining nitro. EMS stated that patient's BP rebounded fairly quickly to 127 SBP. Patient was also given 550mL bolus of NS.

## 2020-07-07 NOTE — Telephone Encounter (Signed)
Pt called stating she woke up at 7am and had really bad chest pain and heaviness. Patient currently c/o chest pain and shortness of breath. Advised pt to go to the emergency room. Pt agreeable with plan. Dr.McLean notified.

## 2020-07-16 DIAGNOSIS — H903 Sensorineural hearing loss, bilateral: Secondary | ICD-10-CM | POA: Diagnosis not present

## 2020-07-24 ENCOUNTER — Other Ambulatory Visit: Payer: Self-pay

## 2020-07-24 ENCOUNTER — Ambulatory Visit (HOSPITAL_COMMUNITY)
Admission: RE | Admit: 2020-07-24 | Discharge: 2020-07-24 | Disposition: A | Discharge status: Home / Self Care | Payer: Medicare PPO | Source: Ambulatory Visit | Attending: Adult Health | Admitting: Adult Health

## 2020-07-24 VITALS — BP 124/80 | HR 83 | Wt 151.8 lb

## 2020-07-24 DIAGNOSIS — Z955 Presence of coronary angioplasty implant and graft: Secondary | ICD-10-CM | POA: Insufficient documentation

## 2020-07-24 DIAGNOSIS — I480 Paroxysmal atrial fibrillation: Secondary | ICD-10-CM

## 2020-07-24 DIAGNOSIS — I340 Nonrheumatic mitral (valve) insufficiency: Secondary | ICD-10-CM | POA: Insufficient documentation

## 2020-07-24 DIAGNOSIS — Z8249 Family history of ischemic heart disease and other diseases of the circulatory system: Secondary | ICD-10-CM | POA: Insufficient documentation

## 2020-07-24 DIAGNOSIS — Z7901 Long term (current) use of anticoagulants: Secondary | ICD-10-CM | POA: Insufficient documentation

## 2020-07-24 DIAGNOSIS — E785 Hyperlipidemia, unspecified: Secondary | ICD-10-CM | POA: Diagnosis not present

## 2020-07-24 DIAGNOSIS — Z79899 Other long term (current) drug therapy: Secondary | ICD-10-CM | POA: Insufficient documentation

## 2020-07-24 DIAGNOSIS — I421 Obstructive hypertrophic cardiomyopathy: Secondary | ICD-10-CM | POA: Diagnosis not present

## 2020-07-24 DIAGNOSIS — I2510 Atherosclerotic heart disease of native coronary artery without angina pectoris: Secondary | ICD-10-CM

## 2020-07-24 DIAGNOSIS — I5032 Chronic diastolic (congestive) heart failure: Secondary | ICD-10-CM

## 2020-07-24 DIAGNOSIS — I110 Hypertensive heart disease with heart failure: Secondary | ICD-10-CM | POA: Insufficient documentation

## 2020-07-24 MED ORDER — AMIODARONE HCL 200 MG PO TABS: 200.0000 mg | ORAL_TABLET | Freq: Two times a day (BID) | ORAL | 1 refills | Status: DC

## 2020-07-24 MED ORDER — NITROGLYCERIN 0.4 MG SL SUBL: 0.4000 mg | SUBLINGUAL_TABLET | SUBLINGUAL | 3 refills | Status: AC | PRN

## 2020-07-24 NOTE — Progress Notes (Signed)
ReDS Vest / Clip - 07/24/20 1300      ReDS Vest / Clip   Station Marker C    Ruler Value 27    ReDS Value Range Low volume    ReDS Actual Value 20    Anatomical Comments sitting
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
## 2020-07-24 NOTE — Patient Instructions (Addendum)
START Amiodarone 200 mg, one tab twice a day  Your physician recommends that you schedule a follow-up appointment in: 2 weeks for a recheck and EKG   If you have any questions or concerns before your next appointment please send [place] a message through [place] or call our office at [phone].    TO LEAVE A MESSAGE FOR THE NURSE SELECT OPTION 2, PLEASE LEAVE A MESSAGE INCLUDING: . YOUR NAME . DATE OF BIRTH . CALL BACK NUMBER . REASON FOR CALL**this is important as we prioritize the call backs  YOU WILL RECEIVE A CALL BACK THE SAME DAY AS LONG AS YOU CALL BEFORE 4:00 PM
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
## 2020-08-07 ENCOUNTER — Ambulatory Visit (HOSPITAL_COMMUNITY)
Admission: RE | Admit: 2020-08-07 | Discharge: 2020-08-07 | Disposition: A | Discharge status: Home / Self Care | Payer: Medicare PPO | Source: Ambulatory Visit | Attending: Adult Health | Admitting: Adult Health

## 2020-08-07 ENCOUNTER — Other Ambulatory Visit (HOSPITAL_COMMUNITY): Payer: Self-pay

## 2020-08-07 ENCOUNTER — Other Ambulatory Visit: Payer: Self-pay

## 2020-08-07 VITALS — HR 84

## 2020-08-07 DIAGNOSIS — I4819 Other persistent atrial fibrillation: Secondary | ICD-10-CM | POA: Insufficient documentation

## 2020-08-07 LAB — BASIC METABOLIC PANEL
Anion gap: 10 (ref 5–15)
BUN: 21 mg/dL (ref 8–23)
CO2: 23 mmol/L (ref 22–32)
Calcium: 8.8 mg/dL — ABNORMAL LOW (ref 8.9–10.3)
Chloride: 102 mmol/L (ref 98–111)
Creatinine, Ser: 1.26 mg/dL — ABNORMAL HIGH (ref 0.44–1.00)
GFR, Estimated: 42 mL/min — ABNORMAL LOW (ref >60)
Glucose, Bld: 139 mg/dL — ABNORMAL HIGH (ref 70–99)
Potassium: 4.1 mmol/L (ref 3.5–5.1)
Sodium: 135 mmol/L (ref 135–145)

## 2020-08-07 LAB — CBC
HCT: 38.7 % (ref 36.0–46.0)
Hemoglobin: 11.8 g/dL — ABNORMAL LOW (ref 12.0–15.0)
MCH: 28.2 pg (ref 26.0–34.0)
MCHC: 30.5 g/dL (ref 30.0–36.0)
MCV: 92.4 fL (ref 80.0–100.0)
Platelets: 177 10*3/uL (ref 150–400)
RBC: 4.19 MIL/uL (ref 3.87–5.11)
RDW: 15 % (ref 11.5–15.5)
WBC: 4.5 10*3/uL (ref 4.0–10.5)
nRBC: 0 % (ref 0.0–0.2)

## 2020-08-07 NOTE — Patient Instructions (Signed)
CARDIOVERSION INSTRUCTIONS   You are scheduled for a Cardioversion on Wednesday November 10th with Dr. Aundra Dubin.  Please arrive at the [facility] (Main Entrance A) at [facility]: [address] [place], [place] [address] at 11 am.  DIET: Nothing to eat or drink after midnight except a sip of water with medications (see medication instructions below)  Medication Instructions:  DO NOT TAKE Lasix morning of 11/10  Continue your anticoagulant: Eliquis   COVID TEST: Monday 11/8 at [place], [place] [address]   You must have a responsible person to drive you home and stay in the waiting area during your procedure. Failure to do so could result in cancellation.  Bring your insurance cards.  *Special Note: Every effort is made to have your procedure done on time. Occasionally there are emergencies that occur at the hospital that may cause delays. Please be patient if a delay does occur.    Labs done today, your results will be available in MyChart, we will contact you for abnormal readings.  If you have any questions or concerns before your next appointment please send [place] a message through [place] or call our office at [phone].    TO LEAVE A MESSAGE FOR THE NURSE SELECT OPTION 2, PLEASE LEAVE A MESSAGE INCLUDING: . YOUR NAME . DATE OF BIRTH . CALL BACK NUMBER . REASON FOR CALL**this is important as we prioritize the call backs  YOU WILL RECEIVE A CALL BACK THE SAME DAY AS LONG AS YOU CALL BEFORE 4:00 PM

## 2020-08-11 ENCOUNTER — Other Ambulatory Visit (HOSPITAL_COMMUNITY)
Admission: RE | Admit: 2020-08-11 | Discharge: 2020-08-11 | Disposition: A | Discharge status: Home / Self Care | Payer: Medicare PPO | Source: Ambulatory Visit | Attending: Cardiology | Admitting: Cardiology

## 2020-08-11 DIAGNOSIS — Z01818 Encounter for other preprocedural examination: Secondary | ICD-10-CM | POA: Diagnosis not present

## 2020-08-11 DIAGNOSIS — Z20822 Contact with and (suspected) exposure to covid-19: Secondary | ICD-10-CM | POA: Diagnosis not present

## 2020-08-11 LAB — SARS CORONAVIRUS 2 (TAT 6-24 HRS): SARS Coronavirus 2: NEGATIVE

## 2020-08-13 ENCOUNTER — Encounter (HOSPITAL_COMMUNITY): Payer: Self-pay | Admitting: Cardiology

## 2020-08-13 ENCOUNTER — Other Ambulatory Visit: Payer: Self-pay

## 2020-08-13 ENCOUNTER — Ambulatory Visit (HOSPITAL_COMMUNITY)
Admission: RE | Admit: 2020-08-13 | Discharge: 2020-08-13 | Disposition: A | Discharge status: Home / Self Care | Payer: Medicare PPO | Attending: Cardiology | Admitting: Cardiology

## 2020-08-13 ENCOUNTER — Encounter (HOSPITAL_COMMUNITY)
Admission: RE | Disposition: A | Discharge status: Home / Self Care | Payer: Self-pay | Source: Home / Self Care | Attending: Cardiology

## 2020-08-13 ENCOUNTER — Ambulatory Visit (HOSPITAL_COMMUNITY): Payer: Medicare PPO | Admitting: Anesthesiology

## 2020-08-13 DIAGNOSIS — I2510 Atherosclerotic heart disease of native coronary artery without angina pectoris: Secondary | ICD-10-CM | POA: Diagnosis not present

## 2020-08-13 DIAGNOSIS — I340 Nonrheumatic mitral (valve) insufficiency: Secondary | ICD-10-CM | POA: Diagnosis not present

## 2020-08-13 DIAGNOSIS — I421 Obstructive hypertrophic cardiomyopathy: Secondary | ICD-10-CM | POA: Insufficient documentation

## 2020-08-13 DIAGNOSIS — Z79890 Hormone replacement therapy (postmenopausal): Secondary | ICD-10-CM | POA: Insufficient documentation

## 2020-08-13 DIAGNOSIS — I110 Hypertensive heart disease with heart failure: Secondary | ICD-10-CM | POA: Insufficient documentation

## 2020-08-13 DIAGNOSIS — E785 Hyperlipidemia, unspecified: Secondary | ICD-10-CM | POA: Diagnosis not present

## 2020-08-13 DIAGNOSIS — Z8249 Family history of ischemic heart disease and other diseases of the circulatory system: Secondary | ICD-10-CM | POA: Insufficient documentation

## 2020-08-13 DIAGNOSIS — Z955 Presence of coronary angioplasty implant and graft: Secondary | ICD-10-CM | POA: Insufficient documentation

## 2020-08-13 DIAGNOSIS — Z79899 Other long term (current) drug therapy: Secondary | ICD-10-CM | POA: Diagnosis not present

## 2020-08-13 DIAGNOSIS — Z853 Personal history of malignant neoplasm of breast: Secondary | ICD-10-CM | POA: Diagnosis not present

## 2020-08-13 DIAGNOSIS — I480 Paroxysmal atrial fibrillation: Secondary | ICD-10-CM | POA: Insufficient documentation

## 2020-08-13 DIAGNOSIS — Z7901 Long term (current) use of anticoagulants: Secondary | ICD-10-CM | POA: Insufficient documentation

## 2020-08-13 DIAGNOSIS — E039 Hypothyroidism, unspecified: Secondary | ICD-10-CM | POA: Diagnosis not present

## 2020-08-13 DIAGNOSIS — I10 Essential (primary) hypertension: Secondary | ICD-10-CM | POA: Diagnosis not present

## 2020-08-13 DIAGNOSIS — I4891 Unspecified atrial fibrillation: Secondary | ICD-10-CM | POA: Diagnosis not present

## 2020-08-13 DIAGNOSIS — I5032 Chronic diastolic (congestive) heart failure: Secondary | ICD-10-CM | POA: Insufficient documentation

## 2020-08-13 DIAGNOSIS — I4819 Other persistent atrial fibrillation: Secondary | ICD-10-CM | POA: Diagnosis not present

## 2020-08-13 HISTORY — PX: CARDIOVERSION: SHX1299

## 2020-08-13 SURGERY — CARDIOVERSION
Anesthesia: General

## 2020-08-13 MED ORDER — LIDOCAINE HCL (CARDIAC) PF 100 MG/5ML IV SOSY
PREFILLED_SYRINGE | INTRAVENOUS | Status: DC | PRN
  Administered 2020-08-13: 40 mg via INTRAVENOUS

## 2020-08-13 MED ORDER — AMIODARONE HCL 200 MG PO TABS: 200.0000 mg | ORAL_TABLET | Freq: Every day | ORAL | 1 refills | Status: DC

## 2020-08-13 MED ORDER — PROPOFOL 10 MG/ML IV BOLUS
INTRAVENOUS | Status: DC | PRN
  Administered 2020-08-13: 40 mg via INTRAVENOUS

## 2020-08-13 NOTE — Interval H&P Note (Signed)
History and Physical Interval Note:  08/13/2020 12:38 PM  Kim [name]  has presented today for surgery, with the diagnosis of AFIB.  The various methods of treatment have been discussed with the patient and family. After consideration of risks, benefits and other options for treatment, the patient has consented to  Procedure(s): CARDIOVERSION (N/A) as a surgical intervention.  The patient's history has been reviewed, patient examined, no change in status, stable for surgery.  I have reviewed the patient's chart and labs.  Questions were answered to the patient's satisfaction.     [name] [employer]

## 2020-08-13 NOTE — Transfer of Care (Signed)
Immediate Anesthesia Transfer of Care Note  Patient: Kim [name]  Procedure(s) Performed: CARDIOVERSION (N/A )  Patient Location: Endoscopy Unit  Anesthesia Type:General  Level of Consciousness: awake, alert , oriented and patient cooperative  Airway & Oxygen Therapy: Patient Spontanous Breathing and Patient connected to nasal cannula oxygen  Post-op Assessment: Report given to RN, Post -op Vital signs reviewed and stable and Patient moving all extremities  Post vital signs: Reviewed and stable  Last Vitals:  Vitals Value Taken Time  BP    Temp    Pulse 67 08/13/20 1309  Resp 9 08/13/20 1309  SpO2 100 % 08/13/20 1309  Vitals shown include unvalidated device data.  Last Pain:  Vitals:   08/13/20 1232  TempSrc: Temporal  PainSc: 0-No pain         Complications: No complications documented.

## 2020-08-13 NOTE — Anesthesia Postprocedure Evaluation (Signed)
Anesthesia Post Note  Patient: Kim [name]  Procedure(s) Performed: CARDIOVERSION (N/A )     Patient location during evaluation: PACU Anesthesia Type: General Level of consciousness: awake and alert, oriented and patient cooperative Pain management: pain level controlled Vital Signs Assessment: post-procedure vital signs reviewed and stable Respiratory status: spontaneous breathing, nonlabored ventilation and respiratory function stable Cardiovascular status: blood pressure returned to baseline and stable Postop Assessment: no apparent nausea or vomiting Anesthetic complications: no   No complications documented.  Last Vitals:  Vitals:   08/13/20 1310 08/13/20 1323  BP: (!) 162/81 (!) 176/92  Pulse: 66 62  Resp: (!) 25 17  Temp: 36.5 C   SpO2: 100% 100%    Last Pain:  Vitals:   08/13/20 1323  TempSrc:   PainSc: 0-No pain                 Pervis Hocking

## 2020-08-13 NOTE — Anesthesia Preprocedure Evaluation (Addendum)
Anesthesia Evaluation  Patient identified by MRN, date of birth, ID band Patient awake    Reviewed: Allergy & Precautions, NPO status , Patient's Chart, lab work & pertinent test results, reviewed documented beta blocker date and time   Airway Mallampati: II  TM Distance: >3 FB Neck ROM: Full    Dental no notable dental hx. (+) Teeth Intact, Dental Advisory Given   Pulmonary neg pulmonary ROS,    Pulmonary exam normal breath sounds clear to auscultation       Cardiovascular hypertension, Pt. on home beta blockers and Pt. on medications + CAD, + Past MI and + Cardiac Stents (DES 2015\)  Normal cardiovascular exam+ dysrhythmias (eliquis) Atrial Fibrillation  Rhythm:Irregular Rate:Normal  Echo 12/2019: 1. Hypertrophic cardiomyopathy, basal septum measures 20 mm. Dynamic LVOT  obstruction with maximal instanteous gradient of 37 mmHg with Valsalva.  The mitral valve demonstrates systolic anterior motion of the clipped  segment, visually similar to prior  exam. Left ventricular ejection fraction, by estimation, is 65%. The left  ventricle has normal function. The left ventricle has no regional wall  motion abnormalities. There is severe asymmetric left ventricular  hypertrophy of the basal-septal segment.  Left ventricular diastolic parameters are indeterminate.  2. Right ventricular systolic function is normal. The right ventricular  size is normal. There is mildly elevated pulmonary artery systolic  pressure. The estimated right ventricular systolic pressure is 66.0 mmHg.  3. Left atrial size was severely dilated.  4. The mitral valve has been repaired/replaced. Mild mitral valve  regurgitation. The mean mitral valve gradient is 3.0 mmHg with average  heart rate of 66 bpm. 2 MitraClip devices placed, no evidenced of  displacement or dehiscence.  5. The aortic valve is tricuspid. Aortic valve regurgitation is trivial.  No aortic  stenosis is present.  6. The inferior vena cava is dilated in size with >50% respiratory  variability, suggesting right atrial pressure of 8 mmHg.    Neuro/Psych negative neurological ROS     GI/Hepatic negative GI ROS, Neg liver ROS,   Endo/Other  Hypothyroidism   Renal/GU Renal InsufficiencyRenal diseaseCr 1.26     Musculoskeletal negative musculoskeletal ROS (+)   Abdominal   Peds  Hematology negative hematology ROS (+)   Anesthesia Other Findings HOH  Hx L breast ca   Reproductive/Obstetrics                            Anesthesia Physical Anesthesia Plan  ASA: IV  Anesthesia Plan: General   Post-op Pain Management:    Induction: Intravenous  PONV Risk Score and Plan: 3 and Propofol infusion, TIVA and Treatment may vary due to age or medical condition  Airway Management Planned: Natural Airway and Mask  Additional Equipment: None  Intra-op Plan:   Post-operative Plan:   Informed Consent: I have reviewed the patients History and Physical, chart, labs and discussed the procedure including the risks, benefits and alternatives for the proposed anesthesia with the patient or authorized representative who has indicated his/her understanding and acceptance.       Plan Discussed with: CRNA  Anesthesia Plan Comments:        Anesthesia Quick Evaluation

## 2020-08-13 NOTE — Procedures (Signed)
Electrical Cardioversion Procedure Note EARNSTINE MEINDERS [id number] [birth year]  Procedure: Electrical Cardioversion Indications:  Atrial Fibrillation  Procedure Details Consent: Risks of procedure as well as the alternatives and risks of each were explained to the (patient/caregiver).  Consent for procedure obtained. Time Out: Verified patient identification, verified procedure, site/side was marked, verified correct patient position, special equipment/implants available, medications/allergies/relevent history reviewed, required imaging and test results available.  Performed  Patient placed on cardiac monitor, pulse oximetry, supplemental oxygen as necessary.  Sedation given: Propofol per anesthesiology Pacer pads placed anterior and posterior chest.  Cardioverted 1 time(s).  Cardioverted at [place].  Evaluation Findings: Post procedure EKG shows: NSR Complications: None Patient did tolerate procedure well.   [name] 08/13/2020, 1:06 PM

## 2020-08-27 ENCOUNTER — Other Ambulatory Visit (HOSPITAL_COMMUNITY): Payer: Self-pay | Admitting: Cardiology

## 2020-09-15 DIAGNOSIS — R300 Dysuria: Secondary | ICD-10-CM | POA: Diagnosis not present

## 2020-09-15 DIAGNOSIS — N390 Urinary tract infection, site not specified: Secondary | ICD-10-CM | POA: Diagnosis not present

## 2020-09-15 DIAGNOSIS — R32 Unspecified urinary incontinence: Secondary | ICD-10-CM | POA: Diagnosis not present

## 2020-09-17 ENCOUNTER — Telehealth (HOSPITAL_COMMUNITY): Payer: Self-pay

## 2020-09-17 NOTE — Telephone Encounter (Signed)
Patient called and left a message on the triage vm wanting to know if it was ok for her to receive her flu shot. Tried calling patient, no answer,unable to leave message. Will try again later.

## 2020-09-19 DIAGNOSIS — M961 Postlaminectomy syndrome, not elsewhere classified: Secondary | ICD-10-CM | POA: Diagnosis not present

## 2020-09-19 DIAGNOSIS — Z96651 Presence of right artificial knee joint: Secondary | ICD-10-CM | POA: Diagnosis not present

## 2020-10-14 DIAGNOSIS — M5416 Radiculopathy, lumbar region: Secondary | ICD-10-CM | POA: Diagnosis not present

## 2020-11-03 ENCOUNTER — Other Ambulatory Visit: Payer: Self-pay | Admitting: Family

## 2020-11-04 ENCOUNTER — Other Ambulatory Visit: Payer: Self-pay | Admitting: Family

## 2020-11-05 DIAGNOSIS — M961 Postlaminectomy syndrome, not elsewhere classified: Secondary | ICD-10-CM | POA: Diagnosis not present

## 2020-11-12 ENCOUNTER — Telehealth: Payer: Self-pay

## 2020-11-12 NOTE — Telephone Encounter (Signed)
Pt called in to cancel her 11/19/20 appts as she has no way to get "all the way here".  States her husband passed away and she does not drive and has a bad back, she would like to transfer [place] [facility].  A message was sent to Dr. Marin Olp and Roselyn Reef to advise     [facility]

## 2020-11-13 ENCOUNTER — Telehealth: Payer: Self-pay

## 2020-11-13 NOTE — Telephone Encounter (Signed)
Called pt with new appt at [facility], ok for pt to see dr Burr Medico per dr Marin Olp Rayann Heman

## 2020-11-19 ENCOUNTER — Inpatient Hospital Stay: Payer: Medicare PPO

## 2020-11-19 ENCOUNTER — Inpatient Hospital Stay: Payer: Medicare PPO | Admitting: Hematology & Oncology

## 2020-11-21 NOTE — Progress Notes (Signed)
[place]   [phone] Fax:([phone]) [place] Note   Patient Care Team: Sueanne Margarita, DO as PCP - General (Internal Medicine) Larey Dresser, MD as PCP - Advanced Heart Failure (Cardiology)  Date of Service:  11/24/2020   CHIEF COMPLAINTS/PURPOSE OF CONSULTATION:  Left Breast Cancer   REFERRING PHYSICIAN:  Dr Marin Olp  Oncology History Overview Note  Cancer Staging DCIS (ductal carcinoma in situ) of breast Staging form: Breast, AJCC 7th Edition - Clinical: Stage 0 (Tis (DCIS), N0, cM0) - Unsigned Staged by: Managing physician Diagnostic confirmation: Positive histology Specimen type: Core Needle Biopsy Histopathologic type: 9932 Laterality: Left Tumor size (mm): 10 Histologic grade (G): G3 - Pathologic: Stage IIIA (T68mic, N2, cM0) - Signed by Deatra Robinson, MD on 11/19/2013 Diagnostic confirmation: Positive histology Specimen type: Core Needle Biopsy Histopathologic type: 9932 Laterality: Left Tumor size (mm): 10    Malignant neoplasm of upper-outer quadrant of left breast in female, estrogen receptor positive ([place])  11/05/2013 Initial Diagnosis   Malignant neoplasm of upper-outer quadrant of left breast in female, estrogen receptor positive ([place])   11/06/2013 Cancer Staging   Staging form: Breast, AJCC 7th Edition - Pathologic stage from 11/06/2013: Tis (DCIS), N2, cM0 - Signed by Truitt Merle, MD on 11/23/2020 Stage prefix: Initial diagnosis Laterality: Left Histologic grade (G): G2 Lymph-vascular invasion (LVI): LVI present/identified, NOS Residual tumor (R): R0 - None Estrogen receptor status: Positive Progesterone receptor status: Positive HER2 status: Positive   Breast cancer ([place])  10/16/2013 Mammogram   FINDINGS:  No mass or distortion is identified in the left breast to suggest  malignancy.   Magnification views of the upper outer left breast show a cluster of  calcifications, with several new  microcalcifications in this region  compared to the magnification views of 04/13/2013. On these views,  these calcifications are linearly oriented, for which ductal  carcinoma in situ cannot be excluded. This cluster of calcifications  measures approximately 1.4 x 0.8 x 1.0 cm.   Mammographic images were processed with CAD.    10/18/2013 Initial Biopsy   Diagnosis Breast, left, needle core biopsy, outer left breast - DUCTAL CARCINOMA IN SITU WITH NECROSIS AND CALCIFICATIONS, SEE COMMENT. Microscopic Comment Although the grade of tumor is best assessed at resection, with these biopsies, in situ carcinoma is grade II. Breast prognostic studies are pending and will be reported in an addendum. The case was reviewed by Dr. Avis Epley who concurs. (CRR:gt, 10/19/13)  Results: IMMUNOHISTOCHEMICAL AND MORPHOMETRIC ANALYSIS BY THE AUTOMATED CELLULAR IMAGING SYSTEM (ACIS) Estrogen Receptor: 100%, POSITIVE, STRONG STAINING INTENSITY Progesterone Receptor: 4%, POSITIVE, MODERATE STAINING INTENSITY   10/31/2013 Pathology Results   Diagnosis Lymph node, needle/core biopsy, left axilla - ONE LYMPH NODE POSITIVE FOR METASTATIC MAMMARY CARCINOMA (1/1). - SEE COMMENT. Microscopic Comment An E-cadherin stain will be performed to determine if the metastatic mammary carcinoma is ductal or lobular in nature. Breast prognostic markers including estrogen receptor, progesterone receptor and Her-2 neu by CISH will be performed and these result reported in an addendum. The findings are called to the [facility] on 11/01/2013. Dr. Donato Heinz has seen this case in consultation with agreement. (RH:kh 11-01-13)   PROGNOSTIC INDICATORS - ACIS Results: IMMUNOHISTOCHEMICAL AND MORPHOMETRIC ANALYSIS BY THE AUTOMATED CELLULAR IMAGING SYSTEM (ACIS) Estrogen Receptor: 0%, NEGATIVE Progesterone Receptor: 0%, NEGATIVE COMMENT: The negative hormone receptor study(ies) in this case have no internal positive  control.  ADDITIONAL INFORMATION: A sample was sent to [facility] for Her 2  testing by FISH. The results are as follows: Positive Average Her 2 signals per nucleus 9.2. Average CN17 signals per nucleus: 2.4 Her 2 / CIN17 signal ratio: 3.8 (JBK:caf 11/12/13)   ADDENDUM: An E-cadherin immunohistochemical stain is performed. It is positive in the metastatic tumor present in the lymph node confirming that the tumor represents metastatic ductal carcinoma. (RAH:gt, 11/02/13)   11/05/2013 Surgery   Left breast lumpectomy with needle localization and axillary LN Dissection by Dr Excell Seltzer    11/06/2013 Pathology Results   Diagnosis 1. Breast, lumpectomy, Left - DUCTAL CARCINOMA IN SITU, 1.5 CM, ASSOCIATED WITH BIOPSY SITE REACTION. - MARGINS NOT INVOLVED BY DUCTAL CARCINOMA IN SITU. - SCATTERED FOCI OF INTRAVASCULAR CARCINOMA. - SEE ONCOLOGY TABLE AND COMMENT. 2. Breast, excision, Left further inferior margin - FIBROCYSTIC CHANGES. - NO MALIGNANCY IDENTIFIED. 3. Lymph nodes, regional resection, Left axillary contents - METASTATIC CARCINOMA IN FOUR OF TEN LYMPH NODES (4/10).   3. PROGNOSTIC INDICATORS - ACIS Results: IMMUNOHISTOCHEMICAL AND MORPHOMETRIC ANALYSIS BY THE AUTOMATED CELLULAR IMAGING SYSTEM (ACIS) Estrogen Receptor: 0%, NEGATIVE Progesterone Receptor: 0%, NEGATIVE Proliferation Marker Ki67: 26% COMMENT: The negative hormone receptor study(ies) in this case have no internal positive control.  3. CHROMOGENIC IN-SITU HYBRIDIZATION Results: HER2/NEU BY CISH - POSITIVE, SHOWS AMPLIFICATION BY CISH ANALYSIS. RESULT RATIO OF HER2: CEP 17 SIGNALS 7.5 AVERAGE HER2 COPY NUMBER PER CELL 15      12/17/2013 - 03/25/2015 Chemotherapy   Treated with 1 dose of Carboplatin/Taxotere and Trastuzumab on 12/17/13 and 4 cycles of Abraxane and Trastuzumab 02/05/11-04/18/14. She continue Trastuzumab until 03/25/15   04/29/2014 -  Antibody Plan   Had 2 doses of Zometa q6 months. 04/29/14-10/23/14.  Switched to Prolia q16months on 01/07/16.    05/14/2014 - 06/26/2014 Radiation Therapy   Diagnosis:  TisN2Mx Stage III left breast ductal carcinoma with 5 of 11 positive lymph nodes    Indication for treatment:  curative        Radiation treatment dates:   05/14/2014-06/26/2014 by Dr Isidore Moos    Site/dose:       1) Left Breast  / 50 Gy in 25 fractions 2) Left Supraclavicular fossa/ 47.5 Gy in 25 fractions 3) Left Posterior Axillary boost / 4.825 Gy in 25 fractions 4) Left Breast boost / 10 Gy in 5 fractions   Beams/energy:   1) Opposed tangents / 10 and 6 MV photons 2) Right anterior oblique / 10 MV photons 3) PA / 6MV photons 4) En face electrons / 12 MeV electrons     Narrative: The patient tolerated radiation treatment relatively well with skin erythema.   07/01/2014 -  Anti-estrogen oral therapy   Exemestane 2.79md daily started 07/01/14.       HISTORY OF PRESENTING ILLNESS:  Kim [name] 86 y.o. female is a here because of left breast cancer. The patient was referred by Dr Marin Olp. The patient presents to the clinic today alone  She was diagnosed with left breast cancer in 2015 and treated with surgery, chemo, radiation by Dr Humphrey Rolls and Dr Isidore Moos at [facility]. She has been on Exemestane and tolerating well. She has been under surveillance with Dr Marin Olp since. Socially her husband passed in 08/2020 and due to distance driving she wanted to be seen by Med Onc in [place]. Dr Marin Olp who was managing her care referred her to me. She has 2 adult children, her daughter lives in [place] and her son lives in [place]. She currently lives alone.   She has Thyroid disease. She notes arthritis  in her right knee.This is managed by Ortho and given cortisone shot. She ambulates with walker at home and cane outside of home. She had back surgery, hysterectomy and heart surgery in the past. She notes with frequent urination she plans to see urologist. I reviewed her medication list with her.    REVIEW  OF SYSTEMS:    Constitutional: Denies fevers, chills or abnormal night sweats Eyes: Denies blurriness of vision, double vision or watery eyes Ears, nose, mouth, throat, and face: Denies mucositis or sore throat (+) Very hard of hearing  Respiratory: Denies cough, dyspnea or wheezes Cardiovascular: Denies palpitation, chest discomfort or lower extremity swelling Gastrointestinal:  Denies nausea, heartburn or change in bowel habits Skin: Denies abnormal skin rashes MSK: (+) Arthritis in knee and back  Lymphatics: Denies new lymphadenopathy or easy bruising Neurological:Denies numbness, tingling or new weaknesses Behavioral/Psych: Mood is stable, no new changes  All other systems were reviewed with the patient and are negative.   MEDICAL HISTORY:  Past Medical History:  Diagnosis Date  . Allergic rhinitis   . Allergy   . Arthritis    hands  . Breast cancer ([place])   . Breast cancer of upper-outer quadrant of left female breast ([place]) 11/08/13   finished chemo and radiation 2015  . CAD (coronary artery disease)    a. 12/2013 Cath/PCI: LM nl, LAD 20p, 82m, 30d, D1 30p, LCX 20p, 48m, OM1 20, Mo2 40p, RCA 30p, 22m (4.0x16 Promus Premier DES), 20d, RPL 30, RPDA 70p, 32m, EF 65-70%.  . Diverticulosis   . HOH (hard of hearing)    bilateral, wears hearing aids  . Hx of adenomatous colonic polyps 02/1999  . Hyperlipidemia   . Hypertension   . Hypertrophic obstructive cardiomyopathy (HOCM) [facility])    Cardiologist is Dr. [name]  . Hypothyroidism   . Iron deficiency anemia due to chronic blood loss 01/07/2016  . Low back pain    gets ESI from Dr. Rennis Harding  . Malabsorption of iron 01/07/2016  . Myocardial infarction ([place]) 12/2013   very mild-with first chemo -placed stent x1  . Persistent atrial fibrillation with rapid ventricular response ([place]) 10/14/2018  . Personal history of radiation therapy 2015  . S/P radiation therapy 05/14/2014-06/26/2014   1) Left Breast  / 50 Gy in 25 fractions, 2)  Left Supraclavicular fossa/ 47.5 Gy in 25 fractions, 3) Left Posterior Axillary boost / 4.825 Gy in 25 fractions, 4) Left Breast boost / 10 Gy in 5 fractions   . Wears hearing aid    left and right     SURGICAL HISTORY: Past Surgical History:  Procedure Laterality Date  . ABDOMINAL HYSTERECTOMY    . ANTERIOR AND POSTERIOR REPAIR N/A 01/17/2015   Procedure: CYSTOCELE REPAIR ;  Surgeon: Princess Bruins, MD;  Location: [facility];  Service: Gynecology;  Laterality: N/A;  . BILATERAL SALPINGOOPHORECTOMY     see Laurin Coder NP for GYN exams  . BREAST BIOPSY Left 10/18/2013  . BREAST BIOPSY Left 10/31/2013  . BREAST LUMPECTOMY Left 11/05/2013  . BREAST LUMPECTOMY WITH NEEDLE LOCALIZATION AND AXILLARY LYMPH NODE DISSECTION Left 11/05/2013   Procedure: LEFT BREAST LUMPECTOMY WITH NEEDLE LOCALIZATION and axillary lymph Node Dissection;  Surgeon: Edward Jolly, MD;  Location: [facility];  Service: General;  Laterality: Left;  . BREAST SURGERY  11/2013   left lumpectomy  . CARDIOVERSION N/A 10/17/2018   Procedure: CARDIOVERSION;  Surgeon: Larey Dresser, MD;  Location: [facility];  Service: Cardiovascular;  Laterality:  N/A;  . CARDIOVERSION N/A 11/08/2018   Procedure: CARDIOVERSION;  Surgeon: Larey Dresser, MD;  Location: [facility] ENDOSCOPY;  Service: Cardiovascular;  Laterality: N/A;  . CARDIOVERSION N/A 08/13/2020   Procedure: CARDIOVERSION;  Surgeon: Larey Dresser, MD;  Location: [facility];  Service: Cardiovascular;  Laterality: N/A;  . CATARACT EXTRACTION W/ INTRAOCULAR LENS  IMPLANT, BILATERAL  2012   bilateral  . COLONOSCOPY  11/04/2015   per Dr. Fuller Plan, adenomatous polyps, no repeats planned   . EYE SURGERY  11/22/2006   cataracts, bilateral, intraocular lens implant  . JOINT REPLACEMENT     2019  . LEFT HEART CATHETERIZATION WITH CORONARY ANGIOGRAM N/A 12/19/2013   Procedure: LEFT HEART CATHETERIZATION WITH CORONARY ANGIOGRAM;  Surgeon: Burnell Blanks, MD;   Location: [facility] CATH LAB;  Service: Cardiovascular;  Laterality: N/A;  . lumbar epidural steroid injection     lumber spinal stenosis  . LUMBAR LAMINECTOMY/DECOMPRESSION MICRODISCECTOMY Right 03/01/2018   Procedure: RIGHT LUMBAR FOUR- LUMBAR FIVE LAMINECTOMY/MICRODISCECTOMY;  Surgeon: [name], MD;  Location: [facility];  Service: Neurosurgery;  Laterality: Right;  . MITRAL VALVE REPAIR N/A 12/06/2018   Procedure: MITRAL VALVE REPAIR;  Surgeon: Sherren Mocha, MD;  Location: [facility] CV LAB;  Service: Cardiovascular;  Laterality: N/A;  . POLYPECTOMY    . PORTACATH PLACEMENT Right 12/11/2013   Procedure: INSERTION PORT-A-CATH;  Surgeon: Edward Jolly, MD;  Location: [place];  Service: General;  Laterality: Right;  Subclavian Vein;   . RIGHT/LEFT HEART CATH AND CORONARY ANGIOGRAPHY N/A 11/03/2018   Procedure: RIGHT/LEFT HEART CATH AND CORONARY ANGIOGRAPHY;  Surgeon: Larey Dresser, MD;  Location: [facility] CV LAB;  Service: Cardiovascular;  Laterality: N/A;  . TEE WITHOUT CARDIOVERSION N/A 10/17/2018   Procedure: TRANSESOPHAGEAL ECHOCARDIOGRAM (TEE);  Surgeon: Larey Dresser, MD;  Location: [facility] ENDOSCOPY;  Service: Cardiovascular;  Laterality: N/A;  . TEE WITHOUT CARDIOVERSION N/A 11/08/2018   Procedure: TRANSESOPHAGEAL ECHOCARDIOGRAM (TEE);  Surgeon: Larey Dresser, MD;  Location: [facility] ENDOSCOPY;  Service: Cardiovascular;  Laterality: N/A;  . TOTAL KNEE ARTHROPLASTY Right 10/24/2017   Procedure: TOTAL RIGHT KNEE ARTHROPLASTY;  Surgeon: Gaynelle Arabian, MD;  Location: [facility];  Service: Orthopedics;  Laterality: Right;    SOCIAL HISTORY: Social History   Socioeconomic History  . Marital status: Widowed    Spouse name: Not on file  . Number of children: 2  . Years of education: Not on file  . Highest education level: Not on file  Occupational History  . Occupation: Retired-school system  Tobacco Use  . Smoking status: Never Smoker  . Smokeless tobacco: Never Used   . Tobacco comment: never used tobacco  Vaping Use  . Vaping Use: Never used  Substance and Sexual Activity  . Alcohol use: No    Alcohol/week: 0.0 standard drinks  . Drug use: No  . Sexual activity: Not Currently    Birth control/protection: Post-menopausal  Other Topics Concern  . Not on file  Social History Narrative   Lives at home by herself, husband died in 2020-09-07   Social Determinants of Health   Financial Resource Strain: Not on file  Food Insecurity: Not on file  Transportation Needs: Not on file  Physical Activity: Not on file  Stress: Not on file  Social Connections: Not on file  Intimate Partner Violence: Not on file    FAMILY HISTORY: Family History  Problem Relation Age of Onset  . Alcohol abuse Father   . Kidney failure Mother   . Colon  cancer Neg Hx   . Rectal cancer Neg Hx   . Stomach cancer Neg Hx   . Colon polyps Neg Hx     ALLERGIES:  is allergic to penicillins, propoxyphene n-acetaminophen, evolocumab, levaquin [levofloxacin], statins, cortisone, myrbetriq [mirabegron], and pseudoephedrine.  MEDICATIONS:  Current Outpatient Medications  Medication Sig Dispense Refill  . amiodarone (PACERONE) 200 MG tablet Take 1 tablet (200 mg total) by mouth daily. 60 tablet 1  . Calcium Carbonate-Vitamin D (CALCIUM-D PO) Take 1 tablet by mouth daily.    Marland Kitchen diltiazem (CARDIZEM CD) 120 MG 24 hr capsule TAKE 1 CAPSULE BY MOUTH ONCE DAILY 30 capsule 11  . ELIQUIS 5 MG TABS tablet TAKE 1 TABLET BY MOUTH TWICE (2) DAILY 180 tablet 1  . exemestane (AROMASIN) 25 MG tablet TAKE 1 TABLET BY MOUTH ONCE DAILY AFTER BREAKFAST 90 tablet 3  . ezetimibe (ZETIA) 10 MG tablet TAKE 1 TABLET BY MOUTH ONCE DAILY 90 tablet 0  . furosemide (LASIX) 40 MG tablet Take 1 tablet (40 mg total) by mouth as needed for fluid or edema. For weight 144 or greater 30 tablet 5  . levothyroxine (SYNTHROID, LEVOTHROID) 137 MCG tablet Take 1 tablet (137 mcg total) by mouth daily before breakfast. 90  tablet 3  . metoprolol succinate (TOPROL-XL) 50 MG 24 hr tablet Take 1 tablet (50 mg total) by mouth 2 (two) times daily. Take with or immediately following a meal. 180 tablet 3  . Multiple Vitamin (MULTIVITAMIN WITH MINERALS) TABS tablet Take 1 tablet by mouth daily.    . nitroGLYCERIN (NITROSTAT) 0.4 MG SL tablet Place 1 tablet (0.4 mg total) under the tongue every 5 (five) minutes as needed for chest pain. 100 tablet 3  . potassium chloride (K-DUR) 10 MEQ tablet Take 2 tablets (20 mEq total) by mouth as needed. Only take 20 meq if you take lasix 60 tablet 5   No current facility-administered medications for this visit.    PHYSICAL EXAMINATION: ECOG PERFORMANCE STATUS: 0 - Asymptomatic  Vitals:   11/24/20 1449  BP: (!) 141/76  Pulse: 67  Resp: 16  Temp: 98.3 F (36.8 C)  SpO2: 99%   Filed Weights   11/24/20 1449  Weight: 148 lb 1.6 oz (67.2 kg)    GENERAL:alert, no distress and comfortable SKIN: skin color, texture, turgor are normal, no rashes or significant lesions EYES: normal, Conjunctiva are pink and non-injected, sclera clear NECK: supple, thyroid normal size, non-tender, without nodularity LYMPH:  no palpable lymphadenopathy in the cervical, axillary  LUNGS: clear to auscultation and percussion with normal breathing effort HEART: regular rate & rhythm and no murmurs and no lower extremity edema ABDOMEN:abdomen soft, non-tender and normal bowel sounds Musculoskeletal:no cyanosis of digits and no clubbing  NEURO: alert & oriented x 3 with fluent speech, no focal motor/sensory deficits BREAST: S/p left breast lumpectomy: Surgical incision healed well (+) lumpy breast tissue in left and right breast, and a 0.5cm firm nodule above her left breast incision.   LABORATORY DATA:  I have reviewed the data as listed CBC Latest Ref Rng & Units 11/24/2020 08/07/2020 07/07/2020  WBC 4.0 - 10.5 K/uL 5.9 4.5 4.7  Hemoglobin 12.0 - 15.0 g/dL 11.5(L) 11.8(L) 10.6(L)  Hematocrit 36.0 -  46.0 % 36.9 38.7 34.1(L)  Platelets 150 - 400 K/uL 177 177 153    CMP Latest Ref Rng & Units 11/24/2020 08/07/2020 07/07/2020  Glucose 70 - 99 mg/dL 89 139(H) 95  BUN 8 - 23 mg/dL 19 21 30(H)  Creatinine  0.44 - 1.00 mg/dL 1.23(H) 1.26(H) 1.69(H)  Sodium 135 - 145 mmol/L 141 135 136  Potassium 3.5 - 5.1 mmol/L 3.9 4.1 4.7  Chloride 98 - 111 mmol/L 108 102 104  CO2 22 - 32 mmol/L $RemoveB'24 23 22  'pYtHhfVL$ Calcium 8.9 - 10.3 mg/dL 8.5(L) 8.8(L) 9.4  Total Protein 6.5 - 8.1 g/dL 6.7 - -  Total Bilirubin 0.3 - 1.2 mg/dL 0.5 - -  Alkaline Phos 38 - 126 U/L 50 - -  AST 15 - 41 U/L 12(L) - -  ALT 0 - 44 U/L 13 - -     RADIOGRAPHIC STUDIES: I have personally reviewed the radiological images as listed and agreed with the findings in the report. No results found.  ASSESSMENT & PLAN:  Kim [name] is a 86 y.o. Caucasian female with a history of CAD, HLD, HTN, Hypothyroidism, IDA, Hard of hearing, MI in 12/2013, Afib   1. Left breast cancer, Stage IIIA, pTisN2M0, ER+/PR+/HER2+  -Diagnosed in 10/2013 with biopsy and surgical path confirmed left breast DCIS, ER+/PR+. No invasive breast cancer found on breast pathology.  -S/p left lumpectomy and LN dissection by Dr Excell Seltzer on 11/05/13. Path showed 4/10 positive LN (ER-/PR-/HER2+) and complete tumor resection.  -she likely had occlude left breast invasive ductal carcinoma ER-/PR-/HER2+ based on her LN involvement.   -She was treated with adjuvant Chemo Abraxane/Herceptin X4, maintenance herceptin and Adjuvant Radiation with Dr William Hamburger and Dr Isidore Moos. -She started antiestrogen therapy with exemestane on 07/01/14. She is tolerating well. Plan to continue to complete 7 years in 06/2021.  -She has been under the care of Dr Marin Olp in [facility] but given distance wants to be seen closer and was referred to me.  -From a breast cancer standpoint, she is clinically doing well. Her physical exam showed lumpy breast tissue in left and right breast, with a firm 0.5cm nodule  around her left breast incision. Her last Mammogram in our records is 12/2017. I recommend Left breast mammogram and [place]. I discussed this is likely scar tissue.  -Will proceed with lab today  -Will continue surveillance. F/u in 6 months.   2. Anemia  -Has been treated with 2 doses of IV Feraheme in 01/2016. She required blood transfusion in 12/2018.  -Will check lab today(11/24/20)  3. Osteopenia  -She was seen to have Osteopenia since 06/2009. There is not another DEXA since in [facility] records. Will repeat DEXA in 2022.  -She had 2 doses of Zometa q6 months 04/29/14-10/23/14. Switched to Prolia q38months on 01/07/16. -Last Prolia dose was 6 months ago.  -Her Prolia PA has expired, will switch her to oral biphosphonate after her DEXA scan in 2 months  4. Arthritis  -She has arthritis in her knee and back. She is s/p back surgery.  -She follows ortho and is given cortisone shots as needed.  -She ambulates with cane outside and with walker at home.   5. Social Support  -Her husband passed in 08/2020 and lives alone in [place]  -She has 2 adult children, her daughter lives in [place] and her son lives in [place].  -She takes care of her ADLs and some house chores.  -She notes she is very hard of hearing. She feels she has mild memory issues.    PLAN:  -Lab today  -DEXA and b/l mammogram  in 2 months -Kim [name] visit in 3 months, will likely start her on oral bisphosphonate -Left mammogram and [place] in 2-3 weeks to evaluate the small breast nodule  -  Lab and F/u in 6 months    Orders Placed This Encounter  Procedures  . DG Bone Density    Standing Status:   Future    Standing Expiration Date:   11/24/2021    Order Specific Question:   Reason for Exam (SYMPTOM  OR DIAGNOSIS REQUIRED)    Answer:   screening    Order Specific Question:   Preferred imaging location?    Answer:   [facility]  . MM Digital Screening    Standing Status:   Future    Standing Expiration Date:   11/24/2021     Order Specific Question:   Reason for Exam (SYMPTOM  OR DIAGNOSIS REQUIRED)    Answer:   screening    Order Specific Question:   Preferred imaging location?    Answer:   [facility]  . MM DIAG BREAST TOMO UNI LEFT    Standing Status:   Future    Standing Expiration Date:   11/24/2021    Order Specific Question:   Reason for Exam (SYMPTOM  OR DIAGNOSIS REQUIRED)    Answer:   left breast nodule above her incision, rule out recurrence    Order Specific Question:   Preferred imaging location?    Answer:   [facility]  . US BREAST LTD UNI LEFT INC AXILLA    Standing Status:   Future    Standing Expiration Date:   11/24/2021    Order Specific Question:   Reason for Exam (SYMPTOM  OR DIAGNOSIS REQUIRED)    Answer:   left breas nodule above her incsion, rule out recurrence    Order Specific Question:   Preferred imaging location?    Answer:   [facility]  . CBC with Differential (Cancer Center Only)    Standing Status:   Standing    Number of Occurrences:   5    Standing Expiration Date:   11/24/2021  . CMP ([facility] only)    Standing Status:   Standing    Number of Occurrences:   5    Standing Expiration Date:   11/24/2021  . Ferritin    Standing Status:   Standing    Number of Occurrences:   3    Standing Expiration Date:   11/24/2021    All questions were answered. The patient knows to call the clinic with any problems, questions or concerns. The total time spent in the appointment was 40 minutes.     Truitt Merle, MD 11/24/2020 8:59 PM  I, Joslyn Devon, am acting as scribe for Truitt Merle, MD.   I have reviewed the above documentation for accuracy and completeness, and I agree with the above.

## 2020-11-24 ENCOUNTER — Inpatient Hospital Stay: Payer: Medicare PPO | Attending: Hematology | Admitting: Hematology

## 2020-11-24 ENCOUNTER — Other Ambulatory Visit: Payer: Self-pay

## 2020-11-24 ENCOUNTER — Encounter: Payer: Self-pay | Admitting: Hematology

## 2020-11-24 ENCOUNTER — Inpatient Hospital Stay: Payer: Medicare PPO

## 2020-11-24 ENCOUNTER — Telehealth: Payer: Self-pay | Admitting: Hematology

## 2020-11-24 VITALS — BP 141/76 | HR 67 | Temp 98.3°F | Resp 16 | Ht 68.0 in | Wt 148.1 lb

## 2020-11-24 DIAGNOSIS — E039 Hypothyroidism, unspecified: Secondary | ICD-10-CM | POA: Insufficient documentation

## 2020-11-24 DIAGNOSIS — D500 Iron deficiency anemia secondary to blood loss (chronic): Secondary | ICD-10-CM | POA: Diagnosis not present

## 2020-11-24 DIAGNOSIS — D649 Anemia, unspecified: Secondary | ICD-10-CM | POA: Diagnosis not present

## 2020-11-24 DIAGNOSIS — C50412 Malignant neoplasm of upper-outer quadrant of left female breast: Secondary | ICD-10-CM | POA: Diagnosis not present

## 2020-11-24 DIAGNOSIS — Z79899 Other long term (current) drug therapy: Secondary | ICD-10-CM | POA: Insufficient documentation

## 2020-11-24 DIAGNOSIS — Z79811 Long term (current) use of aromatase inhibitors: Secondary | ICD-10-CM | POA: Diagnosis not present

## 2020-11-24 DIAGNOSIS — Z170 Estrogen receptor positive status [ER+]: Secondary | ICD-10-CM

## 2020-11-24 DIAGNOSIS — M8580 Other specified disorders of bone density and structure, unspecified site: Secondary | ICD-10-CM | POA: Diagnosis not present

## 2020-11-24 LAB — CBC WITH DIFFERENTIAL (CANCER CENTER ONLY)
Abs Immature Granulocytes: 0.01 10*3/uL (ref 0.00–0.07)
Basophils Absolute: 0 10*3/uL (ref 0.0–0.1)
Basophils Relative: 1 %
Eosinophils Absolute: 0.1 10*3/uL (ref 0.0–0.5)
Eosinophils Relative: 2 %
HCT: 36.9 % (ref 36.0–46.0)
Hemoglobin: 11.5 g/dL — ABNORMAL LOW (ref 12.0–15.0)
Immature Granulocytes: 0 %
Lymphocytes Relative: 19 %
Lymphs Abs: 1.1 10*3/uL (ref 0.7–4.0)
MCH: 30.3 pg (ref 26.0–34.0)
MCHC: 31.2 g/dL (ref 30.0–36.0)
MCV: 97.4 fL (ref 80.0–100.0)
Monocytes Absolute: 0.7 10*3/uL (ref 0.1–1.0)
Monocytes Relative: 11 %
Neutro Abs: 4 10*3/uL (ref 1.7–7.7)
Neutrophils Relative %: 67 %
Platelet Count: 177 10*3/uL (ref 150–400)
RBC: 3.79 MIL/uL — ABNORMAL LOW (ref 3.87–5.11)
RDW: 14.6 % (ref 11.5–15.5)
WBC Count: 5.9 10*3/uL (ref 4.0–10.5)
nRBC: 0 % (ref 0.0–0.2)

## 2020-11-24 LAB — CMP (CANCER CENTER ONLY)
ALT: 13 U/L (ref 0–44)
AST: 12 U/L — ABNORMAL LOW (ref 15–41)
Albumin: 4 g/dL (ref 3.5–5.0)
Alkaline Phosphatase: 50 U/L (ref 38–126)
Anion gap: 9 (ref 5–15)
BUN: 19 mg/dL (ref 8–23)
CO2: 24 mmol/L (ref 22–32)
Calcium: 8.5 mg/dL — ABNORMAL LOW (ref 8.9–10.3)
Chloride: 108 mmol/L (ref 98–111)
Creatinine: 1.23 mg/dL — ABNORMAL HIGH (ref 0.44–1.00)
GFR, Estimated: 44 mL/min — ABNORMAL LOW (ref >60)
Glucose, Bld: 89 mg/dL (ref 70–99)
Potassium: 3.9 mmol/L (ref 3.5–5.1)
Sodium: 141 mmol/L (ref 135–145)
Total Bilirubin: 0.5 mg/dL (ref 0.3–1.2)
Total Protein: 6.7 g/dL (ref 6.5–8.1)

## 2020-11-24 NOTE — Telephone Encounter (Signed)
Left message with follow-up appointment per 2/21 los. Gave option to call back to reschedule if needed.

## 2020-11-25 ENCOUNTER — Other Ambulatory Visit: Payer: Self-pay | Admitting: Hematology

## 2020-11-25 DIAGNOSIS — C50412 Malignant neoplasm of upper-outer quadrant of left female breast: Secondary | ICD-10-CM

## 2020-11-25 DIAGNOSIS — N63 Unspecified lump in unspecified breast: Secondary | ICD-10-CM

## 2020-11-25 DIAGNOSIS — Z170 Estrogen receptor positive status [ER+]: Secondary | ICD-10-CM

## 2020-11-25 LAB — FERRITIN: Ferritin: 21 ng/mL (ref 11–307)

## 2020-11-27 DIAGNOSIS — R350 Frequency of micturition: Secondary | ICD-10-CM | POA: Diagnosis not present

## 2020-11-27 DIAGNOSIS — R310 Gross hematuria: Secondary | ICD-10-CM | POA: Diagnosis not present

## 2020-12-02 ENCOUNTER — Other Ambulatory Visit: Payer: Medicare PPO

## 2020-12-03 DIAGNOSIS — R310 Gross hematuria: Secondary | ICD-10-CM | POA: Diagnosis not present

## 2020-12-26 DIAGNOSIS — N390 Urinary tract infection, site not specified: Secondary | ICD-10-CM | POA: Diagnosis not present

## 2020-12-31 DIAGNOSIS — Z961 Presence of intraocular lens: Secondary | ICD-10-CM | POA: Diagnosis not present

## 2020-12-31 DIAGNOSIS — H524 Presbyopia: Secondary | ICD-10-CM | POA: Diagnosis not present

## 2021-01-08 DIAGNOSIS — R310 Gross hematuria: Secondary | ICD-10-CM | POA: Diagnosis not present

## 2021-01-08 DIAGNOSIS — N3941 Urge incontinence: Secondary | ICD-10-CM | POA: Diagnosis not present

## 2021-01-12 ENCOUNTER — Ambulatory Visit
Admission: RE | Admit: 2021-01-12 | Discharge: 2021-01-12 | Disposition: A | Discharge status: Home / Self Care | Payer: Medicare PPO | Source: Ambulatory Visit | Attending: Hematology | Admitting: Hematology

## 2021-01-12 ENCOUNTER — Other Ambulatory Visit: Payer: Self-pay

## 2021-01-12 DIAGNOSIS — R922 Inconclusive mammogram: Secondary | ICD-10-CM | POA: Diagnosis not present

## 2021-01-12 DIAGNOSIS — C50412 Malignant neoplasm of upper-outer quadrant of left female breast: Secondary | ICD-10-CM

## 2021-01-12 DIAGNOSIS — N63 Unspecified lump in unspecified breast: Secondary | ICD-10-CM

## 2021-01-12 DIAGNOSIS — Z170 Estrogen receptor positive status [ER+]: Secondary | ICD-10-CM

## 2021-01-12 DIAGNOSIS — N6489 Other specified disorders of breast: Secondary | ICD-10-CM | POA: Diagnosis not present

## 2021-01-12 DIAGNOSIS — Z853 Personal history of malignant neoplasm of breast: Secondary | ICD-10-CM | POA: Diagnosis not present

## 2021-01-23 ENCOUNTER — Encounter (HOSPITAL_BASED_OUTPATIENT_CLINIC_OR_DEPARTMENT_OTHER): Payer: Self-pay | Admitting: *Deleted

## 2021-01-23 ENCOUNTER — Emergency Department (HOSPITAL_BASED_OUTPATIENT_CLINIC_OR_DEPARTMENT_OTHER): Payer: Medicare PPO

## 2021-01-23 ENCOUNTER — Emergency Department (HOSPITAL_BASED_OUTPATIENT_CLINIC_OR_DEPARTMENT_OTHER)
Admission: EM | Admit: 2021-01-23 | Discharge: 2021-01-23 | Disposition: A | Discharge status: Home / Self Care | Payer: Medicare PPO | Attending: Emergency Medicine | Admitting: Emergency Medicine

## 2021-01-23 ENCOUNTER — Other Ambulatory Visit: Payer: Self-pay

## 2021-01-23 DIAGNOSIS — M189 Osteoarthritis of first carpometacarpal joint, unspecified: Secondary | ICD-10-CM | POA: Diagnosis not present

## 2021-01-23 DIAGNOSIS — W19XXXA Unspecified fall, initial encounter: Secondary | ICD-10-CM

## 2021-01-23 DIAGNOSIS — R600 Localized edema: Secondary | ICD-10-CM | POA: Diagnosis not present

## 2021-01-23 DIAGNOSIS — Z853 Personal history of malignant neoplasm of breast: Secondary | ICD-10-CM | POA: Insufficient documentation

## 2021-01-23 DIAGNOSIS — M25531 Pain in right wrist: Secondary | ICD-10-CM | POA: Diagnosis not present

## 2021-01-23 DIAGNOSIS — S0511XA Contusion of eyeball and orbital tissues, right eye, initial encounter: Secondary | ICD-10-CM | POA: Diagnosis not present

## 2021-01-23 DIAGNOSIS — Z7901 Long term (current) use of anticoagulants: Secondary | ICD-10-CM | POA: Diagnosis not present

## 2021-01-23 DIAGNOSIS — W1830XA Fall on same level, unspecified, initial encounter: Secondary | ICD-10-CM | POA: Insufficient documentation

## 2021-01-23 DIAGNOSIS — I10 Essential (primary) hypertension: Secondary | ICD-10-CM | POA: Insufficient documentation

## 2021-01-23 DIAGNOSIS — R55 Syncope and collapse: Secondary | ICD-10-CM | POA: Diagnosis not present

## 2021-01-23 DIAGNOSIS — I517 Cardiomegaly: Secondary | ICD-10-CM | POA: Diagnosis not present

## 2021-01-23 DIAGNOSIS — S0083XA Contusion of other part of head, initial encounter: Secondary | ICD-10-CM | POA: Diagnosis not present

## 2021-01-23 DIAGNOSIS — Z79899 Other long term (current) drug therapy: Secondary | ICD-10-CM | POA: Insufficient documentation

## 2021-01-23 DIAGNOSIS — Z96651 Presence of right artificial knee joint: Secondary | ICD-10-CM | POA: Diagnosis not present

## 2021-01-23 DIAGNOSIS — I4819 Other persistent atrial fibrillation: Secondary | ICD-10-CM | POA: Insufficient documentation

## 2021-01-23 DIAGNOSIS — S0990XA Unspecified injury of head, initial encounter: Secondary | ICD-10-CM

## 2021-01-23 DIAGNOSIS — E039 Hypothyroidism, unspecified: Secondary | ICD-10-CM | POA: Insufficient documentation

## 2021-01-23 DIAGNOSIS — I2510 Atherosclerotic heart disease of native coronary artery without angina pectoris: Secondary | ICD-10-CM | POA: Insufficient documentation

## 2021-01-23 DIAGNOSIS — Z043 Encounter for examination and observation following other accident: Secondary | ICD-10-CM | POA: Diagnosis not present

## 2021-01-23 DIAGNOSIS — S0003XA Contusion of scalp, initial encounter: Secondary | ICD-10-CM | POA: Diagnosis not present

## 2021-01-23 LAB — URINALYSIS, ROUTINE W REFLEX MICROSCOPIC
Bilirubin Urine: NEGATIVE
Glucose, UA: NEGATIVE mg/dL
Ketones, ur: NEGATIVE mg/dL
Nitrite: NEGATIVE
Protein, ur: NEGATIVE mg/dL
Specific Gravity, Urine: 1.02 (ref 1.005–1.030)
pH: 5.5 (ref 5.0–8.0)

## 2021-01-23 LAB — COMPREHENSIVE METABOLIC PANEL
ALT: 12 U/L (ref 0–44)
AST: 15 U/L (ref 15–41)
Albumin: 4 g/dL (ref 3.5–5.0)
Alkaline Phosphatase: 40 U/L (ref 38–126)
Anion gap: 9 (ref 5–15)
BUN: 24 mg/dL — ABNORMAL HIGH (ref 8–23)
CO2: 25 mmol/L (ref 22–32)
Calcium: 8.7 mg/dL — ABNORMAL LOW (ref 8.9–10.3)
Chloride: 103 mmol/L (ref 98–111)
Creatinine, Ser: 1.19 mg/dL — ABNORMAL HIGH (ref 0.44–1.00)
GFR, Estimated: 45 mL/min — ABNORMAL LOW (ref >60)
Glucose, Bld: 104 mg/dL — ABNORMAL HIGH (ref 70–99)
Potassium: 3.5 mmol/L (ref 3.5–5.1)
Sodium: 137 mmol/L (ref 135–145)
Total Bilirubin: 0.5 mg/dL (ref 0.3–1.2)
Total Protein: 6.6 g/dL (ref 6.5–8.1)

## 2021-01-23 LAB — CBC WITH DIFFERENTIAL/PLATELET
Abs Immature Granulocytes: 0.01 10*3/uL (ref 0.00–0.07)
Basophils Absolute: 0 10*3/uL (ref 0.0–0.1)
Basophils Relative: 1 %
Eosinophils Absolute: 0.1 10*3/uL (ref 0.0–0.5)
Eosinophils Relative: 2 %
HCT: 35.2 % — ABNORMAL LOW (ref 36.0–46.0)
Hemoglobin: 11.3 g/dL — ABNORMAL LOW (ref 12.0–15.0)
Immature Granulocytes: 0 %
Lymphocytes Relative: 19 %
Lymphs Abs: 0.9 10*3/uL (ref 0.7–4.0)
MCH: 30.5 pg (ref 26.0–34.0)
MCHC: 32.1 g/dL (ref 30.0–36.0)
MCV: 95.1 fL (ref 80.0–100.0)
Monocytes Absolute: 0.5 10*3/uL (ref 0.1–1.0)
Monocytes Relative: 9 %
Neutro Abs: 3.4 10*3/uL (ref 1.7–7.7)
Neutrophils Relative %: 69 %
Platelets: 160 10*3/uL (ref 150–400)
RBC: 3.7 MIL/uL — ABNORMAL LOW (ref 3.87–5.11)
RDW: 13.2 % (ref 11.5–15.5)
WBC: 4.9 10*3/uL (ref 4.0–10.5)
nRBC: 0 % (ref 0.0–0.2)

## 2021-01-23 LAB — TROPONIN I (HIGH SENSITIVITY)
Troponin I (High Sensitivity): 10 ng/L (ref <18)
Troponin I (High Sensitivity): 11 ng/L (ref <18)

## 2021-01-23 LAB — URINALYSIS, MICROSCOPIC (REFLEX)

## 2021-01-23 LAB — MAGNESIUM: Magnesium: 2.1 mg/dL (ref 1.7–2.4)

## 2021-01-23 LAB — BRAIN NATRIURETIC PEPTIDE: B Natriuretic Peptide: 385.3 pg/mL — ABNORMAL HIGH (ref 0.0–100.0)

## 2021-01-23 NOTE — ED Notes (Signed)
Patient back from x-ray 

## 2021-01-23 NOTE — Discharge Instructions (Signed)
Your history and exam today are consistent with fall and soft tissue head injury causing the swelling and bruising.  The CT images were all reassuring with no evidence of fractures or internal bleeding.  The x-rays of the knee and wrist were also reassuring.  Please use your wrist brace to help support your injured wrist and follow-up with your primary doctor.  Please be careful not to fall.  Please make sure you are staying hydrated.  We had a long shared decision-making conversation about speaking with cardiology given her otherwise reassuring work-up however you did not want to wait and feel comfortable following up with cardiology this week.  We discussed the possibility of recurrent syncopal episodes.  If you have any lightheadedness or feeling like you might pass out again, please return to the nearest emergency department as you would likely need admission for cardiology work-up at that time.  If any symptoms change acutely, please return .

## 2021-01-23 NOTE — ED Triage Notes (Signed)
C/o fall last pm from standing landing on hardwood floor injuring right knee and right side of face and head

## 2021-01-23 NOTE — ED Notes (Signed)
States she fell last night in the kitchen onto hardwood flooring, she is unsure on what she hit her head on, EMS did come to her residence to aid her in getting up, but refused transport to ED. Took Tylenol x 2. States she does experience dizzy spells. Denies being sick prior to last PM event. She does take a blood thinner, visual assessment indicated bruised area above rt eye, rt eye lid is also noted to having bruising. MAE x 4. Speech appears WNL. Answers questions appropriately. Follows commands. Required assistance to get out of wc to stretcher.

## 2021-01-23 NOTE — ED Notes (Signed)
Patient transported to X-ray via stretcher, sr x 2 up

## 2021-01-23 NOTE — ED Notes (Signed)
BEFAST and VAN bedside assessment is negative - performed by ED MD

## 2021-01-23 NOTE — ED Notes (Signed)
ED Provider at bedside. 

## 2021-01-23 NOTE — ED Notes (Signed)
Rt knee pain Rt wrist pain

## 2021-01-23 NOTE — ED Notes (Signed)
AVS reviewed with daughter, encouraged client to make a follow up appt with her Cardiologist as recommended by the ED MD. Opportunity for questions provided. WC provided to assist client to exit to POV.

## 2021-01-23 NOTE — ED Provider Notes (Signed)
[facility] EMERGENCY DEPARTMENT Provider Note   CSN: [id number] Arrival date & time: 01/23/21  1521     History Chief Complaint  Patient presents with  . Fall    Kim [name] is a 86 y.o. female.  The history is provided by the patient, a relative and medical records. No language interpreter was used.  Loss of Consciousness Episode history:  Single Most recent episode:  Yesterday Timing:  Intermittent Progression:  Unchanged Chronicity:  New Context: normal activity   Witnessed: no   Relieved by:  Nothing Worsened by:  Nothing Ineffective treatments:  None tried Associated symptoms: dizziness, headaches, malaise/fatigue and recent fall   Associated symptoms: no chest pain, no confusion, no diaphoresis, no difficulty breathing, no fever, no focal weakness, no nausea, no palpitations, no seizures, no shortness of breath, no vomiting and no weakness   Risk factors: coronary artery disease        Past Medical History:  Diagnosis Date  . Allergic rhinitis   . Allergy   . Arthritis    hands  . Breast cancer ([place])   . Breast cancer of upper-outer quadrant of left female breast ([place]) 11/08/13   finished chemo and radiation 2015  . CAD (coronary artery disease)    a. 12/2013 Cath/PCI: LM nl, LAD 20p, 48m, 30d, D1 30p, LCX 20p, 91m, OM1 20, Mo2 40p, RCA 30p, 1m (4.0x16 Promus Premier DES), 20d, RPL 30, RPDA 70p, 30m, EF 65-70%.  . Diverticulosis   . HOH (hard of hearing)    bilateral, wears hearing aids  . Hx of adenomatous colonic polyps 02/1999  . Hyperlipidemia   . Hypertension   . Hypertrophic obstructive cardiomyopathy (HOCM) [facility])    Cardiologist is Dr. [name]  . Hypothyroidism   . Iron deficiency anemia due to chronic blood loss 01/07/2016  . Low back pain    gets ESI from Dr. Rennis Harding  . Malabsorption of iron 01/07/2016  . Myocardial infarction ([place]) 12/2013   very mild-with first chemo -placed stent x1  . Persistent atrial fibrillation with  rapid ventricular response ([place]) 10/14/2018  . Personal history of radiation therapy 2015  . S/P radiation therapy 05/14/2014-06/26/2014   1) Left Breast  / 50 Gy in 25 fractions, 2) Left Supraclavicular fossa/ 47.5 Gy in 25 fractions, 3) Left Posterior Axillary boost / 4.825 Gy in 25 fractions, 4) Left Breast boost / 10 Gy in 5 fractions   . Wears hearing aid    left and right     Patient Active Problem List   Diagnosis Date Noted  . Severe mitral regurgitation 12/06/2018  . Persistent atrial fibrillation with rapid ventricular response ([place]) 10/14/2018  . HNP (herniated nucleus pulposus), lumbar 03/01/2018  . History of total knee replacement, right 11/13/2017  . OA (osteoarthritis) of knee 10/24/2017  . Chronic pain of right knee 06/17/2017  . Osteoporosis 07/30/2016  . Iron deficiency anemia due to chronic blood loss 01/07/2016  . Malabsorption of iron 01/07/2016  . Postoperative state 01/17/2015  . HOCM (hypertrophic obstructive cardiomyopathy) ([place]) 01/09/2014  . Breast cancer ([place]) 01/09/2014  . Coronary atherosclerosis of native coronary artery 12/20/2013  . CAD (coronary artery disease)   . Hyperlipidemia   . Malignant neoplasm of upper-outer quadrant of left breast in female, estrogen receptor positive ([place]) 11/05/2013  . DCIS (ductal carcinoma in situ) of breast 10/26/2013  . Hypertension 04/05/2012  . UNSPECIFIED HEARING LOSS 08/07/2010  . LOW BACK PAIN 04/22/2009  . CONSTIPATION 11/04/2008  .  COLONIC POLYPS, HX OF 11/01/2008  . ALLERGIC RHINITIS 06/27/2007  . Hypothyroidism 06/22/2007    Past Surgical History:  Procedure Laterality Date  . ABDOMINAL HYSTERECTOMY    . ANTERIOR AND POSTERIOR REPAIR N/A 01/17/2015   Procedure: CYSTOCELE REPAIR ;  Surgeon: Princess Bruins, MD;  Location: [facility];  Service: Gynecology;  Laterality: N/A;  . BILATERAL SALPINGOOPHORECTOMY     see Laurin Coder NP for GYN exams  . BREAST BIOPSY Left 10/18/2013  . BREAST BIOPSY Left 10/31/2013   . BREAST LUMPECTOMY Left 11/05/2013  . BREAST LUMPECTOMY WITH NEEDLE LOCALIZATION AND AXILLARY LYMPH NODE DISSECTION Left 11/05/2013   Procedure: LEFT BREAST LUMPECTOMY WITH NEEDLE LOCALIZATION and axillary lymph Node Dissection;  Surgeon: Edward Jolly, MD;  Location: [facility];  Service: General;  Laterality: Left;  . BREAST SURGERY  11/2013   left lumpectomy  . CARDIOVERSION N/A 10/17/2018   Procedure: CARDIOVERSION;  Surgeon: Larey Dresser, MD;  Location: [facility] ENDOSCOPY;  Service: Cardiovascular;  Laterality: N/A;  . CARDIOVERSION N/A 11/08/2018   Procedure: CARDIOVERSION;  Surgeon: Larey Dresser, MD;  Location: [facility] ENDOSCOPY;  Service: Cardiovascular;  Laterality: N/A;  . CARDIOVERSION N/A 08/13/2020   Procedure: CARDIOVERSION;  Surgeon: Larey Dresser, MD;  Location: [facility];  Service: Cardiovascular;  Laterality: N/A;  . CATARACT EXTRACTION W/ INTRAOCULAR LENS  IMPLANT, BILATERAL  2012   bilateral  . COLONOSCOPY  11/04/2015   per Dr. Fuller Plan, adenomatous polyps, no repeats planned   . EYE SURGERY  11/22/2006   cataracts, bilateral, intraocular lens implant  . JOINT REPLACEMENT     2019  . LEFT HEART CATHETERIZATION WITH CORONARY ANGIOGRAM N/A 12/19/2013   Procedure: LEFT HEART CATHETERIZATION WITH CORONARY ANGIOGRAM;  Surgeon: Burnell Blanks, MD;  Location: [facility] CATH LAB;  Service: Cardiovascular;  Laterality: N/A;  . lumbar epidural steroid injection     lumber spinal stenosis  . LUMBAR LAMINECTOMY/DECOMPRESSION MICRODISCECTOMY Right 03/01/2018   Procedure: RIGHT LUMBAR FOUR- LUMBAR FIVE LAMINECTOMY/MICRODISCECTOMY;  Surgeon: [name], MD;  Location: [facility];  Service: Neurosurgery;  Laterality: Right;  . MITRAL VALVE REPAIR N/A 12/06/2018   Procedure: MITRAL VALVE REPAIR;  Surgeon: Sherren Mocha, MD;  Location: [facility] CV LAB;  Service: Cardiovascular;  Laterality: N/A;  . POLYPECTOMY    . PORTACATH PLACEMENT Right 12/11/2013   Procedure:  INSERTION PORT-A-CATH;  Surgeon: Edward Jolly, MD;  Location: [facility];  Service: General;  Laterality: Right;  Subclavian Vein;   . RIGHT/LEFT HEART CATH AND CORONARY ANGIOGRAPHY N/A 11/03/2018   Procedure: RIGHT/LEFT HEART CATH AND CORONARY ANGIOGRAPHY;  Surgeon: Larey Dresser, MD;  Location: [facility] CV LAB;  Service: Cardiovascular;  Laterality: N/A;  . TEE WITHOUT CARDIOVERSION N/A 10/17/2018   Procedure: TRANSESOPHAGEAL ECHOCARDIOGRAM (TEE);  Surgeon: Larey Dresser, MD;  Location: [facility] ENDOSCOPY;  Service: Cardiovascular;  Laterality: N/A;  . TEE WITHOUT CARDIOVERSION N/A 11/08/2018   Procedure: TRANSESOPHAGEAL ECHOCARDIOGRAM (TEE);  Surgeon: Larey Dresser, MD;  Location: [facility] ENDOSCOPY;  Service: Cardiovascular;  Laterality: N/A;  . TOTAL KNEE ARTHROPLASTY Right 10/24/2017   Procedure: TOTAL RIGHT KNEE ARTHROPLASTY;  Surgeon: Gaynelle Arabian, MD;  Location: [facility];  Service: Orthopedics;  Laterality: Right;     OB History    Gravida  2   Para  2   Term      Preterm      AB      Living        SAB  IAB      Ectopic      Multiple      Live Births           Obstetric Comments  Menarche age 50-14, parity age 89, G37,P2,  No BC, HRT x 20 years        Family History  Problem Relation Age of Onset  . Alcohol abuse Father   . Kidney failure Mother   . Colon cancer Neg Hx   . Rectal cancer Neg Hx   . Stomach cancer Neg Hx   . Colon polyps Neg Hx     Social History   Tobacco Use  . Smoking status: Never Smoker  . Smokeless tobacco: Never Used  . Tobacco comment: never used tobacco  Vaping Use  . Vaping Use: Never used  Substance Use Topics  . Alcohol use: No    Alcohol/week: 0.0 standard drinks  . Drug use: No    Home Medications Prior to Admission medications   Medication Sig Start Date End Date Taking? Authorizing Provider  amiodarone (PACERONE) 200 MG tablet Take 1 tablet (200 mg total) by mouth daily. 08/13/20    Larey Dresser, MD  Calcium Carbonate-Vitamin D (CALCIUM-D PO) Take 1 tablet by mouth daily.    [provider]  diltiazem (CARDIZEM CD) 120 MG 24 hr capsule TAKE 1 CAPSULE BY MOUTH ONCE DAILY 08/27/20   Larey Dresser, MD  ELIQUIS 5 MG TABS tablet TAKE 1 TABLET BY MOUTH TWICE (2) DAILY 07/01/20   Larey Dresser, MD  exemestane (AROMASIN) 25 MG tablet TAKE 1 TABLET BY MOUTH ONCE DAILY AFTER BREAKFAST 05/01/20   [name], MD  ezetimibe (ZETIA) 10 MG tablet TAKE 1 TABLET BY MOUTH ONCE DAILY 02/08/20   Larey Dresser, MD  furosemide (LASIX) 40 MG tablet Take 1 tablet (40 mg total) by mouth as needed for fluid or edema. For weight 144 or greater 04/08/20   Larey Dresser, MD  levothyroxine (SYNTHROID, LEVOTHROID) 137 MCG tablet Take 1 tablet (137 mcg total) by mouth daily before breakfast. 08/08/18   Laurey Morale, MD  metoprolol succinate (TOPROL-XL) 50 MG 24 hr tablet Take 1 tablet (50 mg total) by mouth 2 (two) times daily. Take with or immediately following a meal. 03/12/20   Larey Dresser, MD  Multiple Vitamin (MULTIVITAMIN WITH MINERALS) TABS tablet Take 1 tablet by mouth daily.    [provider]  nitroGLYCERIN (NITROSTAT) 0.4 MG SL tablet Place 1 tablet (0.4 mg total) under the tongue every 5 (five) minutes as needed for chest pain. 07/24/20 07/24/21  Clegg, Amy D, NP  potassium chloride (K-DUR) 10 MEQ tablet Take 2 tablets (20 mEq total) by mouth as needed. Only take 20 meq if you take lasix 01/15/19   Clegg, Amy D, NP  ferrous sulfate 325 (65 FE) MG tablet Take 325 mg by mouth 2 (two) times daily.    01/16/19  [provider]  pravastatin (PRAVACHOL) 80 MG tablet Take 1 tablet (80 mg total) by mouth every evening. 11/20/15 01/16/19  Larey Dresser, MD    Allergies    Penicillins, Propoxyphene n-acetaminophen, Evolocumab, Levaquin [levofloxacin], Statins, Cortisone, Myrbetriq [mirabegron], and Pseudoephedrine  Review of Systems   Review of Systems   Constitutional: Positive for fatigue and malaise/fatigue. Negative for chills, diaphoresis and fever.  HENT: Positive for facial swelling. Negative for congestion.   Eyes: Negative for pain and visual disturbance.  Respiratory: Negative for cough, chest tightness, shortness of  breath and wheezing.   Cardiovascular: Positive for leg swelling and syncope. Negative for chest pain and palpitations.  Gastrointestinal: Negative for abdominal pain, constipation, diarrhea, nausea and vomiting.  Genitourinary: Negative for dysuria, flank pain and frequency.  Musculoskeletal: Negative for back pain, neck pain and neck stiffness.  Neurological: Positive for dizziness, syncope, light-headedness and headaches. Negative for focal weakness, seizures, facial asymmetry, weakness and numbness.  Psychiatric/Behavioral: Negative for agitation and confusion.  All other systems reviewed and are negative.   Physical Exam Updated Vital Signs BP 135/66 (BP Location: Left Arm)   Pulse 64   Temp 98.8 F (37.1 C) (Oral)   Resp 16   Ht 5\' 8"  (1.727 m)   Wt 63.5 kg   LMP 10/19/1991   SpO2 99%   BMI 21.29 kg/m   Physical Exam Vitals and nursing note reviewed.  Constitutional:      General: She is not in acute distress.    Appearance: She is well-developed. She is not ill-appearing, toxic-appearing or diaphoretic.  HENT:     Head: Contusion present. No laceration.      Nose: No congestion or rhinorrhea.     Mouth/Throat:     Pharynx: No oropharyngeal exudate or posterior oropharyngeal erythema.  Eyes:     Extraocular Movements: Extraocular movements intact.     Conjunctiva/sclera: Conjunctivae normal.     Pupils: Pupils are equal, round, and reactive to light.  Cardiovascular:     Rate and Rhythm: Normal rate and regular rhythm.     Heart sounds: No murmur heard.   Pulmonary:     Effort: Pulmonary effort is normal. No respiratory distress.     Breath sounds: Normal breath sounds. No wheezing,  rhonchi or rales.  Chest:     Chest wall: No tenderness.  Abdominal:     General: Abdomen is flat.     Palpations: Abdomen is soft.     Tenderness: There is no abdominal tenderness. There is no right CVA tenderness, left CVA tenderness, guarding or rebound.  Musculoskeletal:        General: Swelling, tenderness and signs of injury present.     Right wrist: Tenderness present. No swelling, deformity, lacerations, snuff box tenderness or crepitus. Normal pulse.     Cervical back: Neck supple. No tenderness.     Right knee: Ecchymosis present. No effusion, erythema or lacerations. Normal range of motion. Tenderness present. No medial joint line tenderness.     Right lower leg: No edema.     Left lower leg: No edema.  Skin:    General: Skin is warm and dry.     Capillary Refill: Capillary refill takes less than 2 seconds.     Findings: Bruising present. No erythema.  Neurological:     General: No focal deficit present.     Mental Status: She is alert. Mental status is at baseline.     Sensory: No sensory deficit.     Motor: No weakness.  Psychiatric:        Mood and Affect: Mood normal.     ED Results / Procedures / Treatments   Labs (all labs ordered are listed, but only abnormal results are displayed) Labs Reviewed  CBC WITH DIFFERENTIAL/PLATELET - Abnormal; Notable for the following components:      Result Value   RBC 3.70 (*)    Hemoglobin 11.3 (*)    HCT 35.2 (*)    All other components within normal limits  COMPREHENSIVE METABOLIC PANEL - Abnormal; Notable for the  following components:   Glucose, Bld 104 (*)    BUN 24 (*)    Creatinine, Ser 1.19 (*)    Calcium 8.7 (*)    GFR, Estimated 45 (*)    All other components within normal limits  BRAIN NATRIURETIC PEPTIDE - Abnormal; Notable for the following components:   B Natriuretic Peptide 385.3 (*)    All other components within normal limits  URINALYSIS, ROUTINE W REFLEX MICROSCOPIC - Abnormal; Notable for the  following components:   APPearance CLOUDY (*)    Hgb urine dipstick TRACE (*)    Leukocytes,Ua TRACE (*)    All other components within normal limits  URINALYSIS, MICROSCOPIC (REFLEX) - Abnormal; Notable for the following components:   Bacteria, UA MANY (*)    All other components within normal limits  URINE CULTURE  MAGNESIUM  TSH  TROPONIN I (HIGH SENSITIVITY)  TROPONIN I (HIGH SENSITIVITY)    EKG EKG Interpretation  Date/Time:  Friday January 23 2021 15:55:26 EDT Ventricular Rate:  62 PR Interval:  239 QRS Duration: 108 QT Interval:  459 QTC Calculation: 467 R Axis:   -17 Text Interpretation: Sinus rhythm Prolonged PR interval Borderline left axis deviation Anterior infarct, old Nonspecific repol abnormality, lateral leads When compared to prior, similar appearance.  \ No STEMI Confirmed by Antony Blackbird [id number]) on 01/23/2021 4:00:18 PM   Radiology DG Chest 2 View  Result Date: 01/23/2021 CLINICAL DATA:  Fall EXAM: CHEST - 2 VIEW COMPARISON:  07/07/2020 FINDINGS: Cardiomegaly without edema, focal opacity, pleural effusion or pneumothorax. Clips over the left axilla and cardiac silhouette. IMPRESSION: No active cardiopulmonary disease. Cardiomegaly. Electronically Signed   By: Donavan Foil M.D.   On: 01/23/2021 16:50   DG Wrist Complete Right  Result Date: 01/23/2021 CLINICAL DATA:  Fall EXAM: RIGHT WRIST - COMPLETE 3+ VIEW COMPARISON:  None. FINDINGS: No fracture or malalignment. Advanced arthritis at the first [facility] joint. Moderate arthritis at the first MCP joint. Soft tissues are unremarkable. IMPRESSION: No acute osseous abnormality. Electronically Signed   By: Donavan Foil M.D.   On: 01/23/2021 16:48   CT Head Wo Contrast  Result Date: 01/23/2021 CLINICAL DATA:  [place] last night, bruising at frontal bone and RIGHT eye, some swelling, no loss of consciousness, fell in kitchen on hardwood floors, has been experiencing dizzy spells. History breast cancer, coronary artery  disease post MI, atrial fibrillation, blood thinner therapy, hypertension EXAM: CT HEAD WITHOUT CONTRAST CT MAXILLOFACIAL WITHOUT CONTRAST TECHNIQUE: Multidetector CT imaging of the head and maxillofacial structures were performed using the standard protocol without intravenous contrast. Multiplanar CT image reconstructions of the maxillofacial structures were also generated. Right side of face marked with BB. COMPARISON:  CT head 12/08/2019 no facial bone fractures identified. Visualized portion of cervical spine demonstrates multilevel degenerative disc and facet disease changes. FINDINGS: CT HEAD FINDINGS Brain: Generalized atrophy. Normal ventricular morphology. No midline shift or mass effect. Small vessel chronic ischemic changes of deep cerebral white matter. No intracranial hemorrhage, mass lesion, evidence of acute infarction, or extra-axial fluid collection. Vascular: No hyperdense vessels. Skull: Small RIGHT frontal scalp hematoma.  Calvaria intact Other: N/A CT MAXILLOFACIAL FINDINGS Osseous: Scattered beam hardening artifacts of dental origin. Facial bones intact. Nasal septal deviation to the LEFT. No facial bone fractures identified. Multilevel degenerative disc and facet disease changes of visualized cervical spine. Orbits: Bony orbits intact. Intraorbital soft tissue planes clear without air or pneumatosis. Absent ocular lenses. Sinuses: Paranasal sinuses, mastoid air cells, and middle ear cavities clear bilaterally  Soft tissues: RIGHT frontal scalp soft tissue swelling/small hematoma extending to superior RIGHT periorbital region. IMPRESSION: Atrophy with small vessel chronic ischemic changes of deep cerebral white matter. No acute intracranial abnormalities. No acute facial bone abnormalities. RIGHT frontal scalp hematoma extending RIGHT supraorbital. Degenerative disc and facet disease changes of visualized cervical spine. Electronically Signed   By: Lavonia Dana M.D.   On: 01/23/2021 16:32   DG  Knee Complete 4 Views Right  Result Date: 01/23/2021 CLINICAL DATA:  Fall EXAM: RIGHT KNEE - COMPLETE 4+ VIEW COMPARISON:  None. FINDINGS: Right knee replacement with normal alignment and intact hardware. No fracture. No significant effusion IMPRESSION: Right knee replacement without acute osseous abnormality. Electronically Signed   By: Donavan Foil M.D.   On: 01/23/2021 16:49   CT Maxillofacial Wo Contrast  Result Date: 01/23/2021 CLINICAL DATA:  [place] last night, bruising at frontal bone and RIGHT eye, some swelling, no loss of consciousness, fell in kitchen on hardwood floors, has been experiencing dizzy spells. History breast cancer, coronary artery disease post MI, atrial fibrillation, blood thinner therapy, hypertension EXAM: CT HEAD WITHOUT CONTRAST CT MAXILLOFACIAL WITHOUT CONTRAST TECHNIQUE: Multidetector CT imaging of the head and maxillofacial structures were performed using the standard protocol without intravenous contrast. Multiplanar CT image reconstructions of the maxillofacial structures were also generated. Right side of face marked with BB. COMPARISON:  CT head 12/08/2019 no facial bone fractures identified. Visualized portion of cervical spine demonstrates multilevel degenerative disc and facet disease changes. FINDINGS: CT HEAD FINDINGS Brain: Generalized atrophy. Normal ventricular morphology. No midline shift or mass effect. Small vessel chronic ischemic changes of deep cerebral white matter. No intracranial hemorrhage, mass lesion, evidence of acute infarction, or extra-axial fluid collection. Vascular: No hyperdense vessels. Skull: Small RIGHT frontal scalp hematoma.  Calvaria intact Other: N/A CT MAXILLOFACIAL FINDINGS Osseous: Scattered beam hardening artifacts of dental origin. Facial bones intact. Nasal septal deviation to the LEFT. No facial bone fractures identified. Multilevel degenerative disc and facet disease changes of visualized cervical spine. Orbits: Bony orbits intact.  Intraorbital soft tissue planes clear without air or pneumatosis. Absent ocular lenses. Sinuses: Paranasal sinuses, mastoid air cells, and middle ear cavities clear bilaterally Soft tissues: RIGHT frontal scalp soft tissue swelling/small hematoma extending to superior RIGHT periorbital region. IMPRESSION: Atrophy with small vessel chronic ischemic changes of deep cerebral white matter. No acute intracranial abnormalities. No acute facial bone abnormalities. RIGHT frontal scalp hematoma extending RIGHT supraorbital. Degenerative disc and facet disease changes of visualized cervical spine. Electronically Signed   By: Lavonia Dana M.D.   On: 01/23/2021 16:32    Procedures Procedures   Medications Ordered in ED Medications - No data to display  ED Course  I have reviewed the triage vital signs and the nursing notes.  Pertinent labs & imaging results that were available during my care of the patient were reviewed by me and considered in my medical decision making (see chart for details).    MDM Rules/Calculators/A&P                          Kim [name] is a 85 y.o. female with an extensive past medical history significant for CAD with MI and PCI, hypertrophic obstructive cardiomyopathy, hypothyroidism, hypertension, prior back surgery, breast cancer, and atrial fibrillation on Eliquis therapy who presents with a near syncopal episode during the day yesterday followed by a syncopal episode last night and subsequent fall with head injury, arm injury, and leg injury.  Patient reports that she had very stressful day yesterday trying to deal with insurance really to her husband's death several months ago.  She says that she had a near syncopal and lightheaded episode during the day earlier but then last night while taking her medicine and getting ready for bed, she had a likely syncopal episode in the kitchen causing her to fall hitting her right knee, her right wrist, and her right forehead and eyebrow  area of the face.  She reports moderate pain in her head and face.  She denies any vision changes at this time.  She reports no nausea, vomiting, chest pain, palpitations, shortness of breath but does report she had some worsened peripheral edema recently.  She reports pain in the right knee when she tries to walk on it as well as some pain in her right wrist.  She denies any pain in her chest, back, neck, or abdomen.  Denies any pain in her hips.  Throughout the day, patient is worse with bruising and when they called her PCP, they told her to come to the emergency department given her history of blood thinner use and obvious head injury.  Patient reportedly had EMS called last night when she could not get up but then refused transport last night and was not seen.  On exam, patient has significant bruising and swelling of the right forehead at the eyebrow and around the orbit.  She had normal extraocular movements with no evidence of entrapment initially.  Pupils are symmetric and reactive and pupils are both round.  No nasal septal hematoma seen oropharyngeal exam appears somewhat pale but no evidence of PTA or RPA.  Lungs clear and chest nontender.  Back nontender on exam.  No new tenderness in her low back.  Abdomen nontender, hips nontender.  She has some tenderness and bruising to the anterior portion of the right knee.  She also has some tenderness in the right wrist but does not have point tenderness in the anatomic snuffbox.  Otherwise intact grip strength, sensation, and pulses.  No focal neurologic deficits on initial exam with normal sensation and strength in extremities and normal finger-nose-finger testing bilaterally.  Given the patient's history of blood thinner use and obvious trauma to the head and face, will get CT of the head and face.  We will also get imaging of the right wrist and the right knee.  Anticipate appropriate disposition if any injuries are discovered.  I am also concerned  about the patient's near syncopal episode and syncopal episode last night given her history of A. fib with RVR, hypertrophic expected cardiomyopathy, and CAD.  We we will get screening labs but we will also get a chest x-ray urinalysis to look for occult infection that made if made her feel more tired and had these near syncopal episode in the setting of stress.  Family does report has had frequent UTIs that have caused her to feel off in the past.  Anticipate reassessment after work-up.  Patient's work-up returned showing no evidence of fracture or internal bleeding.  She does have hematoma in her forehead as anticipated.  No facial fracture or entrapment seen.  The x-ray shows no acute fracture and wrist x-ray did not show fracture but we will give her a wrist brace given the tenderness in the hand.  She can follow-up with her PCP and orthopedist if needed as we discussed.  We had a long discussion about cardiology consult given the syncopal episode and her  history of HOCM and heart failure.  She does not want [place] to call cardiology as she is feeling better and her work-up was otherwise reassuring.  Her BNP is somewhat similar to the past and not as bad as it has been before and her troponin was negative x2.  Given patient's well appearance for nearly 5 hours in the emergency department, I feel this is a reasonable plan.  Patient is going to have family stay with her and call cardiology to be seen on Monday or Tuesday for further assessment of the syncope.  She fervently denies any urinary symptoms so without any nitrites, do not feel patient has UTI.  We will send a culture.  Patient agrees with plan of care and discharge with close monitoring at home.  She will follow-up with cardiology and PCP.  She understood strict return precautions and was discharged in stable condition.    Final Clinical Impression(s) / ED Diagnoses Final diagnoses:  Fall, initial encounter  Contusion of face, initial  encounter  Injury of head, initial encounter  Syncope, unspecified syncope type    Rx / DC Orders ED Discharge Orders    None     Clinical Impression: 1. Fall, initial encounter   2. Contusion of face, initial encounter   3. Injury of head, initial encounter   4. Syncope, unspecified syncope type     Disposition: Discharge  Condition: Good  I have discussed the results, Dx and Tx plan with the pt(& family if present). He/she/they expressed understanding and agree(s) with the plan. Discharge instructions discussed at great length. Strict return precautions discussed and pt &/or family have verbalized understanding of the instructions. No further questions at time of discharge.    New Prescriptions   No medications on file    Follow Up: Sueanne Margarita, Lydia Haines [place] [id number] [phone]     your cardiologist        Bridgitt Raggio, Gwenyth Allegra, MD 01/23/21 2013

## 2021-01-24 LAB — TSH: TSH: 2.636 u[IU]/mL (ref 0.350–4.500)

## 2021-01-26 ENCOUNTER — Telehealth (HOSPITAL_COMMUNITY): Payer: Self-pay | Admitting: *Deleted

## 2021-01-26 LAB — URINE CULTURE: Culture: >=100000 — AB

## 2021-01-26 NOTE — Telephone Encounter (Signed)
Left VM for pt to call back to schedule appt

## 2021-01-26 NOTE — Telephone Encounter (Signed)
Needs to work in with Darrick Grinder this week please.  Needs 2 wk Zio patch monitor.  Needs orthostatic BP checked.

## 2021-01-26 NOTE — Telephone Encounter (Signed)
pts daughter called stating pt felt dizzy "blacked out " and fell Thursday. She went to the ED Friday they did not make any medication changes. Pts daughter thinks medications may be the cause of dizziness and would like Dr.McLean to review her meds and hospital note.   Routed to [place]

## 2021-01-27 ENCOUNTER — Ambulatory Visit (HOSPITAL_COMMUNITY)
Admission: RE | Admit: 2021-01-27 | Discharge: 2021-01-27 | Disposition: A | Discharge status: Home / Self Care | Payer: Medicare PPO | Source: Ambulatory Visit | Attending: Adult Health | Admitting: Adult Health

## 2021-01-27 ENCOUNTER — Telehealth: Payer: Self-pay | Admitting: Emergency Medicine

## 2021-01-27 ENCOUNTER — Other Ambulatory Visit: Payer: Self-pay

## 2021-01-27 VITALS — BP 118/68 | HR 85 | Wt 148.0 lb

## 2021-01-27 DIAGNOSIS — E785 Hyperlipidemia, unspecified: Secondary | ICD-10-CM | POA: Insufficient documentation

## 2021-01-27 DIAGNOSIS — I421 Obstructive hypertrophic cardiomyopathy: Secondary | ICD-10-CM | POA: Insufficient documentation

## 2021-01-27 DIAGNOSIS — Z79899 Other long term (current) drug therapy: Secondary | ICD-10-CM | POA: Insufficient documentation

## 2021-01-27 DIAGNOSIS — Z8249 Family history of ischemic heart disease and other diseases of the circulatory system: Secondary | ICD-10-CM | POA: Diagnosis not present

## 2021-01-27 DIAGNOSIS — Z7901 Long term (current) use of anticoagulants: Secondary | ICD-10-CM | POA: Diagnosis not present

## 2021-01-27 DIAGNOSIS — M791 Myalgia, unspecified site: Secondary | ICD-10-CM | POA: Diagnosis not present

## 2021-01-27 DIAGNOSIS — R55 Syncope and collapse: Secondary | ICD-10-CM | POA: Insufficient documentation

## 2021-01-27 DIAGNOSIS — I2510 Atherosclerotic heart disease of native coronary artery without angina pectoris: Secondary | ICD-10-CM | POA: Insufficient documentation

## 2021-01-27 DIAGNOSIS — I110 Hypertensive heart disease with heart failure: Secondary | ICD-10-CM | POA: Insufficient documentation

## 2021-01-27 DIAGNOSIS — Z955 Presence of coronary angioplasty implant and graft: Secondary | ICD-10-CM | POA: Diagnosis not present

## 2021-01-27 DIAGNOSIS — I5032 Chronic diastolic (congestive) heart failure: Secondary | ICD-10-CM

## 2021-01-27 DIAGNOSIS — Z79890 Hormone replacement therapy (postmenopausal): Secondary | ICD-10-CM | POA: Insufficient documentation

## 2021-01-27 DIAGNOSIS — E039 Hypothyroidism, unspecified: Secondary | ICD-10-CM | POA: Diagnosis not present

## 2021-01-27 DIAGNOSIS — I480 Paroxysmal atrial fibrillation: Secondary | ICD-10-CM | POA: Insufficient documentation

## 2021-01-27 DIAGNOSIS — I340 Nonrheumatic mitral (valve) insufficiency: Secondary | ICD-10-CM | POA: Diagnosis not present

## 2021-01-27 DIAGNOSIS — Z853 Personal history of malignant neoplasm of breast: Secondary | ICD-10-CM | POA: Insufficient documentation

## 2021-01-27 MED ORDER — METOPROLOL SUCCINATE ER 25 MG PO TB24: 25.0000 mg | ORAL_TABLET | Freq: Two times a day (BID) | ORAL | 3 refills | Status: DC

## 2021-01-27 MED ORDER — AMIODARONE HCL 200 MG PO TABS: 200.0000 mg | ORAL_TABLET | Freq: Every day | ORAL | 3 refills | Status: DC

## 2021-01-27 NOTE — Patient Instructions (Addendum)
Make sure you are not taking Amiodarone  Decrease Metoprolol to 25 mg Twice daily   Your physician has requested that you have a carotid duplex. This test is an ultrasound of the carotid arteries in your neck. It looks at blood flow through these arteries that supply the brain with blood. Allow one hour for this exam. There are no restrictions or special instructions.  Your physician recommends that you schedule a follow-up appointment in: 1 month with an echocardiogram  If you have any questions or concerns before your next appointment please send [place] a message through [place] or call our office at [phone].    TO LEAVE A MESSAGE FOR THE NURSE SELECT OPTION 2, PLEASE LEAVE A MESSAGE INCLUDING: . YOUR NAME . DATE OF BIRTH . CALL BACK NUMBER . REASON FOR CALL**this is important as we prioritize the call backs  [place] AS LONG AS YOU CALL BEFORE 4:00 PM  At the [facility], you and your health needs are our priority. As part of our continuing mission to provide you with exceptional heart care, we have created designated Provider Care Teams. These Care Teams include your primary Cardiologist (physician) and Advanced Practice Providers (APPs- Physician Assistants and Nurse Practitioners) who all work together to provide you with the care you need, when you need it.   You may see any of the following providers on your designated Care Team at your next follow up: Marland Kitchen Dr Glori Bickers . Dr [name] . Dr Vickki Muff . Darrick Grinder, NP . Lyda Jester, [place] . Audry Riles, PharmD   Please be sure to bring in all your medications bottles to every appointment.

## 2021-01-27 NOTE — Progress Notes (Signed)
Patient ID: TRINIDAD INGLE, female   DOB: 18-Apr-1935, 86 y.o.   MRN: [id number] PCP: Dr. Sarajane Jews Oncologist: Dr. Marin Olp Cardiology: Dr. Aundra Dubin  86 y.o. with history of CAD, HCM, paroxysmal atrial fibrillation, and mitral regurgitation presents for followup of HCM, CHF, MR.  Patient had breast cancer in 2015 and received treatment involving Herceptin.  During breast cancer treatment, she developed unstable angina and ended up getting a DES to the mid RCA in 3/15.  Patient additionally has a history of HOCM.  This has been recognized on prior echoes.  She has severe asymmetric basal septal hypertrophy and SAM with LVOT gradient peak 58 mmHg on echo in 3/15 along with moderate MR.  Repeat echo in 7/15 showed LVOT gradient down to 30 mmHg on higher beta blocker.  Repeat echo in 11/15 showed no significant LVOT gradient but SAM still present.  Echo (8/16) showed asymmetric septal hypertrophy, No SAM, no significant LVOT gradient.  Echo 4/18 showed EF 55-60%, small LVOT gradient with severe asymmetric septal hypertrophy, mild MR, PASP 32 mmHg.    She was admitted in 1/20 with symptomatic atrial fibrillation with RVR, this was a new diagnosis.  She was started on Eliquis and cardioverted after TEE back to NSR.  TEE showed severe mitral regurgitation that appeared to be due mostly to posterior leaflet prolapse, there was minimal systolic anterior motion.   In 3/20, she had Mitraclip placement. Echo in 6/20 showed EF 60-65%, severe asymmetric septal hypertrophy, normal RV, s/p Mitraclip with mean gradient 3 mmHg and trivial MR.  Echo in 3/21 showed EF 65%, asymmetric septal hypertrophy with LVOT gradient 37 mmHg, SAM of clipped segment of the mitral valve, normal RV, severe LAE, s/p Mitraclip x 2 with mild MR/no MS.   In October she went to [facility] ED on 07/07/20 for chest pain. She was in A fib at that time. Work up negative so she was discharged later that day.   Had cardioversion 08/07/20 --> NSR  Evaluated in the  ED on 01/23/21 for  presyncope on 01/22/21 and syncope 01/23/21. She does not recall tripping or dizziness. Bruising noted on face/arms. No acute findings. She declined cardiology consultation and preferred to be seen in the clinic.   Today she returns for an acute HF follow up due to ED visit on 01/23/21. Overall feeling better. No dizziness today. No falls. She says on most days she is dizzy after she takes her medications. Has had some headaches.  Denies SOB/PND/Orthopnea. Appetite ok. No fever or chills. Weight at home has been stable.  Taking all medications. Of note she has amiodarone on her medication list but she doesn't think she is taking it. Lives alone. Her daughter helps with her meals.   Labs (3/15): K 4.5, creatinine 0.84, LDL 91, HDL 46 Labs (5/15): K 3.9, creatinine 1.1 Labs (12/15): LDL 80, HDL 28, hemoglobin 10.9 Labs (1/16): K 3.7, creatinine 0.8 Labs (2/16): K 3.6, creatinine 1.2, HCT 34.4 Labs (6/16): K 4.1, creatinine 1.24 Labs (7/17): K 4.1, creatinine 1.2, HCT 38.8, LDL 143 Labs (1/18): LDL 142 Labs (5/18): K 4.4, creatinine 1.0 Labs (1/20): K 3.9, creatinine 1.27, LDL 95 Labs (8/20): K 3.8, creatinine 2.06, LFTs normal Labs (12/20): K 4.1, creatinine 1.14 Labs (2/21): LDL 121 Labs (3/21): K 4.4, creatinine 1.27 Labs (01/23/21): K 3.5 Creatinine 1.2   PMH: 1. CAD: Unstable angina 3/15 with LHC showing 99% mRCA stenosis, treated with DES to mRCA.  2. Hypothyroidism 3. Diverticulosis 4. HTN 5. H/o TAH/BSO  6. Breast cancer: s/p lumpectomy with lymph node biopsy in 2/15.  4/10 nodes positive.  She was started on docetaxol/carboplatin/Herceptin in 3/15 with plan for 6 cycles chemo.  7. Hypertrophic obstructive cardiomyopathy: Echo (3/15) with severe focal basal septal hypertrophy (22 mm), narrow LV outflow tract with mitral valve SAM and 58 mmHg peak LVOT resting gradient, EF 60-65%, moderate MR, moderate LAE, normal RV, lateral s' 10.4 cm/sec.  Patient says that her 2 grown  sons has had echoes to screen for HOCM.  Echo (4/15) with EF 65-70%, severe focal basal septal hypertrophy, LVOT gradient 33 mmHg, SAM was present with moderate MR, normal RV size and systolic function, lateral S' 10.6, GLS -17.5%.  Cardiac MRI (5/15) with EF 65%, moderate asymmetric septal hypertrophy, systolic anterior motion of the mitral valve with moderate MR, there was no delayed enhancement.  Echo (7/15) with EF 65-70%, moderate ASH, peak LVOT gradient 30 mmHg, SAM with moderate MR, moderate to severe LAE.  Echo (11/15) with EF 55-60%, GLS -15%, no significant LVOT gradient, systolic anterior motion of the mitral valve with mild MR, normal RV size and systolic function. Echo (4/14) with EF 55-60%, severe asymmetric septal hypertrophy, LVOT gradient 20 mmHg, mild-moderate MR, GLS -18%.  - Echo (8/16): EF 60-65%, asymmetric septal hypertrophy, no significant LV outflow tract gradient, mild MR.   - Echo (4/18): EF 55-60%, small LVOT gradient with severe asymmetric septal hypertrophy, mild MR, PASP 32 mmHg.   - TEE (1/20): EF 55-60%, moderate-severe asymmetric septal hypertrophy, no LVOT gradient, normal RV size and systolic function, severe LAE, severe MR with posterior MV leaflet prolapse and minimal SAM.   - Echo (6/20): EF 60-65%, severe asymmetric septal hypertrophy, normal RV, s/p Mitraclip with mean gradient 3 mmHg and trivial MR.   - Echo (3/21): EF 65%, asymmetric septal hypertrophy with LVOT gradient 37 mmHg, SAM of clipped segment of the mitral valve, normal RV, severe LAE, s/p Mitraclip x 2 with mild MR/no MS.  8. Hyperlipidemia: Myalgias with atorvastatin, myalgias with Repatha.  9. Atrial fibrillation: Paroxysmal, first noted in 1/20.  10. Mitral regurgitation: Appears severe on 1/20 TEE.  Has posterior leaflet prolapse with minimal SAM.  - Mitraclip 3/20.   SH: Married, 2 children, retired, nonsmoker.   FH: No family history of HOCM or sudden death.  There is a family history of CAD.    ROS: All systems reviewed and negative except as per HPI.    Current Outpatient Medications  Medication Sig Dispense Refill  . Calcium Carbonate-Vitamin D (CALCIUM-D PO) Take 1 tablet by mouth daily.    Marland Kitchen diltiazem (CARDIZEM CD) 120 MG 24 hr capsule TAKE 1 CAPSULE BY MOUTH ONCE DAILY 30 capsule 11  . ELIQUIS 5 MG TABS tablet TAKE 1 TABLET BY MOUTH TWICE (2) DAILY 180 tablet 1  . exemestane (AROMASIN) 25 MG tablet TAKE 1 TABLET BY MOUTH ONCE DAILY AFTER BREAKFAST 90 tablet 3  . ezetimibe (ZETIA) 10 MG tablet TAKE 1 TABLET BY MOUTH ONCE DAILY 90 tablet 0  . furosemide (LASIX) 40 MG tablet Take 1 tablet (40 mg total) by mouth as needed for fluid or edema. For weight 144 or greater 30 tablet 5  . levothyroxine (SYNTHROID, LEVOTHROID) 137 MCG tablet Take 1 tablet (137 mcg total) by mouth daily before breakfast. 90 tablet 3  . metoprolol succinate (TOPROL-XL) 50 MG 24 hr tablet Take 1 tablet (50 mg total) by mouth 2 (two) times daily. Take with or immediately following a meal. 180 tablet 3  .  Multiple Vitamin (MULTIVITAMIN WITH MINERALS) TABS tablet Take 1 tablet by mouth daily.    . nitroGLYCERIN (NITROSTAT) 0.4 MG SL tablet Place 1 tablet (0.4 mg total) under the tongue every 5 (five) minutes as needed for chest pain. 100 tablet 3  . potassium chloride (K-DUR) 10 MEQ tablet Take 2 tablets (20 mEq total) by mouth as needed. Only take 20 meq if you take lasix 60 tablet 5   No current facility-administered medications for this encounter.   BP 118/68   Pulse 85   Wt 67.1 kg   LMP 10/19/1991   SpO2 97%   BMI 22.50 kg/m   Vitals:   01/27/21 1456  BP: 118/68  Pulse: 85  SpO2: 97%   Wt Readings from Last 3 Encounters:  01/27/21 67.1 kg  01/23/21 63.5 kg  11/24/20 67.2 kg    ReDS Vest / Clip - 01/27/21 1500      ReDS Vest / Clip   Station Marker C    Ruler Value 26    ReDS Value Range Low volume    ReDS Actual Value 21            General:   No resp difficulty. Walked in the  clinic with a rolling walker.  HEENT: Ecchymotic on the right forehead and cheek.  Neck: supple. no JVD. Carotids 2+ bilat; no bruits. No lymphadenopathy or thryomegaly appreciated. Cor: PMI nondisplaced. Regular rate & rhythm. No rubs, gallops or murmurs. Lungs: clear Abdomen: soft, nontender, nondistended. No hepatosplenomegaly. No bruits or masses. Good bowel sounds. Extremities: no cyanosis, clubbing, rash, edema Neuro: alert & orientedx3, cranial nerves grossly intact. moves all 4 extremities w/o difficulty. Affect pleasant   EKG: SR with 1st degree heart block PR interval 220 ms   Assessment/Plan: 1. CAD: Status post PCI for unstable angina in 3/15 with DES to [facility].  - No chest pain.   - No ASA, taking Eliquis.    - Unable to tolerate statins or Repatha, she is on Zetia.  2. Hyperlipidemia: Myalgias with Crestor, Lipitor, and pravastatin.  Myalgias with Repatha.  She has tolerated Zetia 10 mg daily.   3. Hypertrophic obstructive cardiomyopathy: No family history of HOCM or sudden death (interestingly, her husband has HOCM). Cardiac MRI in 5/15 showed no delayed enhancement.  Echo in 3/21 (post-Mitraclip) showed asymmetric septal hypertrophy with LVOT gradient peak 37 mmHg, SAM of the clipped segment of the mitral valve with mild MR, no MS.  - Continue diltiazem CD 120 mg daily.  - Cut back Toprol XL to 25 mg twice a day with LVOT gradient and SAM.  - Per patient, her 2 sons have had screening echoes to look for hypertrophic cardiomyopathy and did not show signs of HOCM.    - Set up ECHO next visit.  4. Atrial fibrillation: Noted initially in 1/20, suspect this was triggered by mitral regurgitation and HCM.  She was cardioverted to NSR.  She was on amiodarone for a few months, this was stopped after Mitraclip.  Last in NSR back in 03/12/2020. In A fib 07/07/20. Had successful to cardioversion 08/07/20.  - In NSR today.  - Stopping amiodarone today.   - Continue Eliquis.  5. Chronic  diastolic CHF: Suspect this is triggered by atrial fibrillation and mitral regurgitation.   Reds Clip 21% . Does not need diuretics.   Continue lasix as needed.  6. Mitral regurgitation: Suspect that mitral regurgitation may have been the trigger for atrial fibrillation.  I think the mechanism of  MR was most likely mitral valve prolapse.She has now had Mitraclip placement, echo in 3/21 showed mild MR, no significant mitral stenosis.  7. Syncope -Place 2 weeks Zio Patch  - Check carotid dopplers.    -Set up ECHO next visit.  - Set up carotid dopplers - Discussed medication changes.  - Follow up 4 weeks with Dr Aundra Dubin.    Ethelyn Cerniglia NP-C  01/27/2021

## 2021-01-27 NOTE — Progress Notes (Signed)
ReDS Vest / Clip - 01/27/21 1500      ReDS Vest / Clip   Station Marker C    Ruler Value 26    ReDS Value Range Low volume    ReDS Actual Value 21

## 2021-01-27 NOTE — Telephone Encounter (Signed)
Post ED Visit - Positive Culture Follow-up  Culture report reviewed by antimicrobial stewardship pharmacist: [place] Team []  Elenor Quinones, Pharm.D. []  Heide Guile, Pharm.D., BCPS AQ-ID []  Parks Neptune, Pharm.D., BCPS []  Alycia Rossetti, Pharm.D., BCPS []  [place], Pharm.D., BCPS, AAHIVP []  Legrand Como, Pharm.D., BCPS, AAHIVP []  Salome Arnt, PharmD, BCPS []  Johnnette Gourd, PharmD, BCPS []  Hughes Better, PharmD, BCPS []  Leeroy Cha, PharmD []  Laqueta Linden, PharmD, BCPS []  Albertina Parr, PharmD  Central Team []  Leodis Sias, PharmD []  Lindell Spar, PharmD []  Royetta Asal, PharmD []  Graylin Shiver, Rph []  Rema Fendt) Glennon Mac, PharmD []  Arlyn Dunning, PharmD []  Netta Cedars, PharmD []  Dia Sitter, PharmD []  Leone Haven, PharmD []  Gretta Arab, PharmD []  Theodis Shove, PharmD []  Peggyann Juba, PharmD []  Reuel Boom, PharmD   Positive urine culture asymptomatic and no further patient follow-up is required at this time.  Hazle Nordmann 01/27/2021, 12:45 PM

## 2021-02-03 ENCOUNTER — Ambulatory Visit (HOSPITAL_COMMUNITY)
Admission: RE | Admit: 2021-02-03 | Discharge: 2021-02-03 | Disposition: A | Discharge status: Home / Self Care | Payer: Medicare PPO | Source: Ambulatory Visit | Attending: Cardiovascular Disease | Admitting: Cardiovascular Disease

## 2021-02-03 ENCOUNTER — Other Ambulatory Visit: Payer: Self-pay

## 2021-02-03 DIAGNOSIS — R55 Syncope and collapse: Secondary | ICD-10-CM

## 2021-02-09 ENCOUNTER — Other Ambulatory Visit (HOSPITAL_COMMUNITY): Payer: Self-pay | Admitting: Cardiology

## 2021-02-26 ENCOUNTER — Telehealth: Payer: Self-pay

## 2021-02-26 NOTE — Telephone Encounter (Signed)
Pt called to see if we would take her back/transfer back from Dr. Burr Medico.  This is ok per Dr Lubertha Basque message and pt is awrae of her new appt  Kim [name]

## 2021-03-03 ENCOUNTER — Ambulatory Visit (HOSPITAL_BASED_OUTPATIENT_CLINIC_OR_DEPARTMENT_OTHER)
Admission: RE | Admit: 2021-03-03 | Discharge: 2021-03-03 | Disposition: A | Discharge status: Home / Self Care | Payer: Medicare PPO | Source: Ambulatory Visit | Attending: Cardiology | Admitting: Cardiology

## 2021-03-03 ENCOUNTER — Other Ambulatory Visit: Payer: Self-pay

## 2021-03-03 ENCOUNTER — Encounter (HOSPITAL_COMMUNITY): Payer: Self-pay | Admitting: Cardiology

## 2021-03-03 ENCOUNTER — Other Ambulatory Visit (HOSPITAL_COMMUNITY): Payer: Self-pay | Admitting: Cardiology

## 2021-03-03 ENCOUNTER — Other Ambulatory Visit: Payer: Self-pay | Admitting: Hematology & Oncology

## 2021-03-03 ENCOUNTER — Ambulatory Visit (HOSPITAL_COMMUNITY)
Admission: RE | Admit: 2021-03-03 | Discharge: 2021-03-03 | Disposition: A | Discharge status: Home / Self Care | Payer: Medicare PPO | Source: Ambulatory Visit | Attending: Internal Medicine | Admitting: Internal Medicine

## 2021-03-03 VITALS — BP 124/78 | HR 81 | Wt 147.8 lb

## 2021-03-03 DIAGNOSIS — I2510 Atherosclerotic heart disease of native coronary artery without angina pectoris: Secondary | ICD-10-CM

## 2021-03-03 DIAGNOSIS — I4819 Other persistent atrial fibrillation: Secondary | ICD-10-CM | POA: Diagnosis not present

## 2021-03-03 DIAGNOSIS — Z79899 Other long term (current) drug therapy: Secondary | ICD-10-CM | POA: Insufficient documentation

## 2021-03-03 DIAGNOSIS — I5032 Chronic diastolic (congestive) heart failure: Secondary | ICD-10-CM | POA: Diagnosis not present

## 2021-03-03 DIAGNOSIS — Z7901 Long term (current) use of anticoagulants: Secondary | ICD-10-CM | POA: Insufficient documentation

## 2021-03-03 DIAGNOSIS — Z955 Presence of coronary angioplasty implant and graft: Secondary | ICD-10-CM | POA: Diagnosis not present

## 2021-03-03 DIAGNOSIS — R55 Syncope and collapse: Secondary | ICD-10-CM | POA: Diagnosis not present

## 2021-03-03 DIAGNOSIS — I480 Paroxysmal atrial fibrillation: Secondary | ICD-10-CM | POA: Diagnosis not present

## 2021-03-03 DIAGNOSIS — Z170 Estrogen receptor positive status [ER+]: Secondary | ICD-10-CM

## 2021-03-03 DIAGNOSIS — E782 Mixed hyperlipidemia: Secondary | ICD-10-CM

## 2021-03-03 DIAGNOSIS — E785 Hyperlipidemia, unspecified: Secondary | ICD-10-CM | POA: Insufficient documentation

## 2021-03-03 DIAGNOSIS — I110 Hypertensive heart disease with heart failure: Secondary | ICD-10-CM | POA: Diagnosis not present

## 2021-03-03 DIAGNOSIS — I421 Obstructive hypertrophic cardiomyopathy: Secondary | ICD-10-CM | POA: Diagnosis not present

## 2021-03-03 DIAGNOSIS — I340 Nonrheumatic mitral (valve) insufficiency: Secondary | ICD-10-CM | POA: Insufficient documentation

## 2021-03-03 DIAGNOSIS — D0510 Intraductal carcinoma in situ of unspecified breast: Secondary | ICD-10-CM

## 2021-03-03 DIAGNOSIS — C50412 Malignant neoplasm of upper-outer quadrant of left female breast: Secondary | ICD-10-CM

## 2021-03-03 DIAGNOSIS — C50012 Malignant neoplasm of nipple and areola, left female breast: Secondary | ICD-10-CM

## 2021-03-03 HISTORY — DX: Heart failure, unspecified: I50.9

## 2021-03-03 LAB — BASIC METABOLIC PANEL
Anion gap: 7 (ref 5–15)
BUN: 18 mg/dL (ref 8–23)
CO2: 25 mmol/L (ref 22–32)
Calcium: 9.6 mg/dL (ref 8.9–10.3)
Chloride: 103 mmol/L (ref 98–111)
Creatinine, Ser: 1.26 mg/dL — ABNORMAL HIGH (ref 0.44–1.00)
GFR, Estimated: 42 mL/min — ABNORMAL LOW (ref >60)
Glucose, Bld: 97 mg/dL (ref 70–99)
Potassium: 4.3 mmol/L (ref 3.5–5.1)
Sodium: 135 mmol/L (ref 135–145)

## 2021-03-03 LAB — CBC
HCT: 38 % (ref 36.0–46.0)
Hemoglobin: 11.7 g/dL — ABNORMAL LOW (ref 12.0–15.0)
MCH: 28.9 pg (ref 26.0–34.0)
MCHC: 30.8 g/dL (ref 30.0–36.0)
MCV: 93.8 fL (ref 80.0–100.0)
Platelets: 198 10*3/uL (ref 150–400)
RBC: 4.05 MIL/uL (ref 3.87–5.11)
RDW: 13.2 % (ref 11.5–15.5)
WBC: 5.8 10*3/uL (ref 4.0–10.5)
nRBC: 0 % (ref 0.0–0.2)

## 2021-03-03 LAB — LIPID PANEL
Cholesterol: 187 mg/dL (ref 0–200)
HDL: 39 mg/dL — ABNORMAL LOW (ref >40)
LDL Cholesterol: 122 mg/dL — ABNORMAL HIGH (ref 0–99)
Total CHOL/HDL Ratio: 4.8 RATIO
Triglycerides: 131 mg/dL (ref <150)
VLDL: 26 mg/dL (ref 0–40)

## 2021-03-03 LAB — ECHOCARDIOGRAM COMPLETE: S' Lateral: 2.4 cm

## 2021-03-03 MED ORDER — AMIODARONE HCL 200 MG PO TABS: ORAL_TABLET | ORAL | 5 refills | Status: DC

## 2021-03-03 NOTE — Patient Instructions (Addendum)
EKG done today.  Labs done today. We will contact you only if your labs are abnormal.  START Amiodarone 200mg  (1 tablet) by mouth 2 times daily for 2 weeks. THEN DECREASE to 200mg  (1 tablet) by mouth daily.  PLEASE DO NOT MISS ANY OF YOUR ELIQUIS DOSES.   No other medication changes were made. Please continue all current medications as prescribed.  Your physician recommends that you schedule a follow-up appointment in: 1 month  If you have any questions or concerns before your next appointment please send [place] a message through [place] or call our office at [phone].    TO LEAVE A MESSAGE FOR THE NURSE SELECT OPTION 2, PLEASE LEAVE A MESSAGE INCLUDING: . YOUR NAME . DATE OF BIRTH . CALL BACK NUMBER . REASON FOR CALL**this is important as we prioritize the call backs  YOU WILL RECEIVE A CALL BACK THE SAME DAY AS LONG AS YOU CALL BEFORE 4:00 PM   Do the following things EVERYDAY: 1) Weigh yourself in the morning before breakfast. Write it down and keep it in a log. 2) Take your medicines as prescribed 3) Eat low salt foods--Limit salt (sodium) to 2000 mg per day.  4) Stay as active as you can everyday 5) Limit all fluids for the day to less than 2 liters   At the [facility], you and your health needs are our priority. As part of our continuing mission to provide you with exceptional heart care, we have created designated Provider Care Teams. These Care Teams include your primary Cardiologist (physician) and Advanced Practice Providers (APPs- Physician Assistants and Nurse Practitioners) who all work together to provide you with the care you need, when you need it.   You may see any of the following providers on your designated Care Team at your next follow up: Marland Kitchen Dr Glori Bickers . Dr [name] . Darrick Grinder, NP . Lyda Jester, PA . Audry Riles, PharmD   Please be sure to bring in all your medications bottles to every appointment.        [place] "A"     [place] AND TIME OF PROCEDURE: Friday June 10th 2022      Please register at the Admitting Department at 9:30am    .   DO NOT EAT OR DRINK ANYTHING after midnight prior to your procedure.  You should take your medications as usual with a sip of water.   DO NOT STOP any blood thinners that you may be taking.   You will need someone with you to drive you home after the procedure.   If you have any questions, please call our office at [phone] option 2

## 2021-03-03 NOTE — H&P (View-Only) (Signed)
Patient ID: Kim [name], female   DOB: 05-Dec-1934, 86 y.o.   MRN: [id number] PCP: Dr. Sarajane Jews Oncologist: Dr. Marin Olp Cardiology: Dr. Aundra Dubin  86 y.o. with history of CAD, HCM, paroxysmal atrial fibrillation, and mitral regurgitation presents for followup of HCM, CHF, MR.  Patient had breast cancer in 2015 and received treatment involving Herceptin.  During breast cancer treatment, she developed unstable angina and ended up getting a DES to the mid RCA in 3/15.  Patient additionally has a history of HOCM.  This has been recognized on prior echoes.  She has severe asymmetric basal septal hypertrophy and SAM with LVOT gradient peak 58 mmHg on echo in 3/15 along with moderate MR.  Repeat echo in 7/15 showed LVOT gradient down to 30 mmHg on higher beta blocker.  Repeat echo in 11/15 showed no significant LVOT gradient but SAM still present.  Echo (8/16) showed asymmetric septal hypertrophy, No SAM, no significant LVOT gradient.  Echo 4/18 showed EF 55-60%, small LVOT gradient with severe asymmetric septal hypertrophy, mild MR, PASP 32 mmHg.    She was admitted in 1/20 with symptomatic atrial fibrillation with RVR, this was a new diagnosis.  She was started on Eliquis and cardioverted after TEE back to NSR.  TEE showed severe mitral regurgitation that appeared to be due mostly to posterior leaflet prolapse, there was minimal systolic anterior motion.   In 3/20, she had Mitraclip placement. Echo in 6/20 showed EF 60-65%, severe asymmetric septal hypertrophy, normal RV, s/p Mitraclip with mean gradient 3 mmHg and trivial MR.  Echo in 3/21 showed EF 65%, asymmetric septal hypertrophy with LVOT gradient 37 mmHg, SAM of clipped segment of the mitral valve, normal RV, severe LAE, s/p Mitraclip x 2 with mild MR/no MS.   In October she went to [facility] ED on 07/07/20 for chest pain. She was in A fib at that time. Work up negative so she was discharged later that day.   Had cardioversion 08/07/20 --> NSR  Evaluated in the  ED on 01/23/21 for  presyncope on 01/22/21 and syncope 01/23/21. She does not recall tripping or dizziness. Bruising noted on face/arms. No acute findings. She declined cardiology consultation and preferred to be seen in the clinic. She was supposed to wear Zio patch but this was never done.  Carotid dopplers showed no significant disease in 5/22.   Echo was done today, showing EF 60-65% with several focal basal septal hypertrophy, LVOT gradient was not measured, s/p Mitraclip with mean gradient 6 mmHg and mild-moderate MR, RV normal, PASP 38 mmHg.   She returns for followup of CHF, hypertrophic cardiomyopathy, atrial fibrillation.  She has noted significant fatigue recently.  She uses a walker for stability.  The fatigue is more prominent than dyspnea.  No chest pain.  No further loss of consciousness or falls.  She does not occasional mild lightheadedness if she stands too fast. Weight down 1 lb.  She also has chronic knee and low back pain. She is back in atrial fibrillation again today, she does not feel palpitations.   ECG (personally reviewed): atrial fibrillation, LAFB, nonspecific T wave changes, rate 98 bpm  Labs (3/15): K 4.5, creatinine 0.84, LDL 91, HDL 46 Labs (5/15): K 3.9, creatinine 1.1 Labs (12/15): LDL 80, HDL 28, hemoglobin 10.9 Labs (1/16): K 3.7, creatinine 0.8 Labs (2/16): K 3.6, creatinine 1.2, HCT 34.4 Labs (6/16): K 4.1, creatinine 1.24 Labs (7/17): K 4.1, creatinine 1.2, HCT 38.8, LDL 143 Labs (1/18): LDL 142 Labs (5/18): K 4.4,  creatinine 1.0 Labs (1/20): K 3.9, creatinine 1.27, LDL 95 Labs (8/20): K 3.8, creatinine 2.06, LFTs normal Labs (12/20): K 4.1, creatinine 1.14 Labs (2/21): LDL 121 Labs (3/21): K 4.4, creatinine 1.27 Labs (01/23/21): K 3.5 Creatinine 1.2, TSH normal, BNP 385  PMH: 1. CAD: Unstable angina 3/15 with LHC showing 99% mRCA stenosis, treated with DES to mRCA.  2. Hypothyroidism 3. Diverticulosis 4. HTN 5. H/o TAH/BSO 6. Breast cancer: s/p  lumpectomy with lymph node biopsy in 2/15.  4/10 nodes positive.  She was started on docetaxol/carboplatin/Herceptin in 3/15 with plan for 6 cycles chemo.  7. Hypertrophic obstructive cardiomyopathy: Echo (3/15) with severe focal basal septal hypertrophy (22 mm), narrow LV outflow tract with mitral valve SAM and 58 mmHg peak LVOT resting gradient, EF 60-65%, moderate MR, moderate LAE, normal RV, lateral s' 10.4 cm/sec.  Patient says that her 2 grown sons has had echoes to screen for HOCM.  Echo (4/15) with EF 65-70%, severe focal basal septal hypertrophy, LVOT gradient 33 mmHg, SAM was present with moderate MR, normal RV size and systolic function, lateral S' 10.6, GLS -17.5%.  Cardiac MRI (5/15) with EF 65%, moderate asymmetric septal hypertrophy, systolic anterior motion of the mitral valve with moderate MR, there was no delayed enhancement.  Echo (7/15) with EF 65-70%, moderate ASH, peak LVOT gradient 30 mmHg, SAM with moderate MR, moderate to severe LAE.  Echo (11/15) with EF 55-60%, GLS -15%, no significant LVOT gradient, systolic anterior motion of the mitral valve with mild MR, normal RV size and systolic function. Echo (4/14) with EF 55-60%, severe asymmetric septal hypertrophy, LVOT gradient 20 mmHg, mild-moderate MR, GLS -18%.  - Echo (8/16): EF 60-65%, asymmetric septal hypertrophy, no significant LV outflow tract gradient, mild MR.   - Echo (4/18): EF 55-60%, small LVOT gradient with severe asymmetric septal hypertrophy, mild MR, PASP 32 mmHg.   - TEE (1/20): EF 55-60%, moderate-severe asymmetric septal hypertrophy, no LVOT gradient, normal RV size and systolic function, severe LAE, severe MR with posterior MV leaflet prolapse and minimal SAM.   - Echo (6/20): EF 60-65%, severe asymmetric septal hypertrophy, normal RV, s/p Mitraclip with mean gradient 3 mmHg and trivial MR.   - Echo (3/21): EF 65%, asymmetric septal hypertrophy with LVOT gradient 37 mmHg, SAM of clipped segment of the mitral valve,  normal RV, severe LAE, s/p Mitraclip x 2 with mild MR/no MS.  - Echo (5/22): EF 60-65% with several focal basal septal hypertrophy, LVOT gradient was not measured, s/p Mitraclip with mean gradient 6 mmHg and mild-moderate MR, RV normal, PASP 38 mmHg.  8. Hyperlipidemia: Myalgias with atorvastatin, myalgias with Repatha.  9. Atrial fibrillation: Paroxysmal, first noted in 1/20.  - DCCV 11/21.  10. Mitral regurgitation: Appears severe on 1/20 TEE.  Has posterior leaflet prolapse with minimal SAM.  - Mitraclip 3/20.   SH: Married, 2 children, retired, nonsmoker.   FH: No family history of HOCM or sudden death.  There is a family history of CAD.   ROS: All systems reviewed and negative except as per HPI.    Current Outpatient Medications  Medication Sig Dispense Refill  . amiodarone (PACERONE) 200 MG tablet Take 1 tablet by mouth 2 times daily for 2 weeks then take 1 tablet by mouth daily. 58 tablet 5  . Calcium Carbonate-Vitamin D (CALCIUM-D PO) Take 1 tablet by mouth daily.    Marland Kitchen diltiazem (CARDIZEM CD) 120 MG 24 hr capsule TAKE 1 CAPSULE BY MOUTH ONCE DAILY 30 capsule 11  .  ELIQUIS 5 MG TABS tablet TAKE 1 TABLET BY MOUTH TWICE (2) DAILY 180 tablet 1  . ezetimibe (ZETIA) 10 MG tablet TAKE 1 TABLET BY MOUTH ONCE DAILY 90 tablet 0  . furosemide (LASIX) 40 MG tablet Take 1 tablet (40 mg total) by mouth as needed for fluid or edema. For weight 144 or greater 30 tablet 5  . levothyroxine (SYNTHROID, LEVOTHROID) 137 MCG tablet Take 1 tablet (137 mcg total) by mouth daily before breakfast. 90 tablet 3  . metoprolol succinate (TOPROL-XL) 25 MG 24 hr tablet Take 1 tablet (25 mg total) by mouth 2 (two) times daily. Take with or immediately following a meal. 60 tablet 3  . Multiple Vitamin (MULTIVITAMIN WITH MINERALS) TABS tablet Take 1 tablet by mouth daily.    . nitroGLYCERIN (NITROSTAT) 0.4 MG SL tablet Place 1 tablet (0.4 mg total) under the tongue every 5 (five) minutes as needed for chest pain. 100  tablet 3  . potassium chloride (K-DUR) 10 MEQ tablet Take 2 tablets (20 mEq total) by mouth as needed. Only take 20 meq if you take lasix 60 tablet 5  . exemestane (AROMASIN) 25 MG tablet TAKE 1 TABLET BY MOUTH ONCE DAILY AFTER BREAKFAST 90 tablet 3   No current facility-administered medications for this encounter.   BP 124/78   Pulse 81   Wt 67 kg (147 lb 12.8 oz)   LMP 10/19/1991   SpO2 97%   BMI 22.47 kg/m   Vitals:   03/03/21 0924  BP: 124/78  Pulse: 81  SpO2: 97%   Wt Readings from Last 3 Encounters:  03/03/21 67 kg (147 lb 12.8 oz)  01/27/21 67.1 kg (148 lb)  01/23/21 63.5 kg (140 lb)    General: NAD Neck: No JVD, no thyromegaly or thyroid nodule.  Lungs: Clear to auscultation bilaterally with normal respiratory effort. CV: Nondisplaced PMI.  Heart regular S1/S2, no S3/S4, 1/6 SEM RUSB.  No peripheral edema.  No carotid bruit.  Normal pedal pulses.  Abdomen: Soft, nontender, no hepatosplenomegaly, no distention.  Skin: Intact without lesions or rashes.  Neurologic: Alert and oriented x 3.  Psych: Normal affect. Extremities: No clubbing or cyanosis.  HEENT: Normal.   Assessment/Plan: 1. CAD: Status post PCI for unstable angina in 3/15 with DES to [facility]. No chest pain.   - No ASA, taking Eliquis.    - Unable to tolerate statins or Repatha, she is on Zetia.  2. Hyperlipidemia: Myalgias with Crestor, Lipitor, and pravastatin.  Myalgias with Repatha.  She has tolerated Zetia 10 mg daily.   - Check lipids today.  3. Hypertrophic obstructive cardiomyopathy: No family history of HOCM or sudden death (interestingly, her husband has HOCM). Cardiac MRI in 5/15 showed no delayed enhancement.  Echo in 3/21 (post-Mitraclip) showed asymmetric septal hypertrophy with LVOT gradient peak 37 mmHg, SAM of the clipped segment of the mitral valve with mild MR, no MS.  Echo was done today showing asymmetric septal hypertrophy but LVOT gradient was not measured.  - Continue diltiazem CD 120  mg daily.  - Continue Toprol XL 25 mg bid.   - Per patient, her 2 sons have had screening echoes to look for hypertrophic cardiomyopathy and did not show signs of HOCM.    4. Atrial fibrillation: Noted initially in 1/20, suspect this was triggered by mitral regurgitation and HCM.  She was cardioverted to NSR.  She was on amiodarone for a few months, this was stopped after Mitraclip. In A fib 07/07/20. Had successful to  cardioversion 08/07/20.  Today, she notes increased fatigue and is back in atrial fibrillation again.  She has not missed any Eliquis doses.  - Restart amiodarone 200 mg bid x 2 wks then 200 mg daily.    - Continue Eliquis.  - I will arrange for DCCV next week (no TEE as she has not missed Eliquis).  We discussed risks/benefits and she agrees to procedure.  5. Chronic diastolic CHF:  Echo today with normal EF and normal RV, suspect she still has LVOT gradient though it was not measured on echo today.  She is not volume overloaded on exam, suspect increased fatigue is due to going into atrial fibrillation.   - She does not need regular Lasix.  - Restore NSR, as above.  6. Mitral regurgitation: Suspect that mitral regurgitation may have been a trigger for atrial fibrillation.  I think the mechanism of MR was most likely mitral valve prolapse.She has now had Mitraclip placement, echo today showed mild-moderate MR, mild MS with mean gradient 6 mmHg.  - Antibiotics with dental work.  7. Syncope: No recurrence since decreasing Toprol XL dose.  ?Orthostatic.   - Would arrange for Zio patch x 2 wks after DCCV.   Followup in 1 month.   [name] 03/03/2021

## 2021-03-03 NOTE — Progress Notes (Signed)
  Echocardiogram 2D Echocardiogram has been performed.  Kim [name] 03/03/2021, 8:54 AM

## 2021-03-03 NOTE — Progress Notes (Signed)
Patient ID: Kim [name], female   DOB: [birth year], 86 y.o.   MRN: [id number] PCP: Dr. Sarajane Jews Oncologist: Dr. Marin Olp Cardiology: Dr. Aundra Dubin  86 y.o. with history of CAD, HCM, paroxysmal atrial fibrillation, and mitral regurgitation presents for followup of HCM, CHF, MR.  Patient had breast cancer in 2015 and received treatment involving Herceptin.  During breast cancer treatment, she developed unstable angina and ended up getting a DES to the mid RCA in 3/15.  Patient additionally has a history of HOCM.  This has been recognized on prior echoes.  She has severe asymmetric basal septal hypertrophy and SAM with LVOT gradient peak 58 mmHg on echo in 3/15 along with moderate MR.  Repeat echo in 7/15 showed LVOT gradient down to 30 mmHg on higher beta blocker.  Repeat echo in 11/15 showed no significant LVOT gradient but SAM still present.  Echo (8/16) showed asymmetric septal hypertrophy, No SAM, no significant LVOT gradient.  Echo 4/18 showed EF 55-60%, small LVOT gradient with severe asymmetric septal hypertrophy, mild MR, PASP 32 mmHg.    She was admitted in 1/20 with symptomatic atrial fibrillation with RVR, this was a new diagnosis.  She was started on Eliquis and cardioverted after TEE back to NSR.  TEE showed severe mitral regurgitation that appeared to be due mostly to posterior leaflet prolapse, there was minimal systolic anterior motion.   In 3/20, she had Mitraclip placement. Echo in 6/20 showed EF 60-65%, severe asymmetric septal hypertrophy, normal RV, s/p Mitraclip with mean gradient 3 mmHg and trivial MR.  Echo in 3/21 showed EF 65%, asymmetric septal hypertrophy with LVOT gradient 37 mmHg, SAM of clipped segment of the mitral valve, normal RV, severe LAE, s/p Mitraclip x 2 with mild MR/no MS.   In October she went to [facility] ED on 07/07/20 for chest pain. She was in A fib at that time. Work up negative so she was discharged later that day.   Had cardioversion 08/07/20 --> NSR  Evaluated in the  ED on 01/23/21 for  presyncope on 01/22/21 and syncope 01/23/21. She does not recall tripping or dizziness. Bruising noted on face/arms. No acute findings. She declined cardiology consultation and preferred to be seen in the clinic. She was supposed to wear Zio patch but this was never done.  Carotid dopplers showed no significant disease in 5/22.   Echo was done today, showing EF 60-65% with several focal basal septal hypertrophy, LVOT gradient was not measured, s/p Mitraclip with mean gradient 6 mmHg and mild-moderate MR, RV normal, PASP 38 mmHg.   She returns for followup of CHF, hypertrophic cardiomyopathy, atrial fibrillation.  She has noted significant fatigue recently.  She uses a walker for stability.  The fatigue is more prominent than dyspnea.  No chest pain.  No further loss of consciousness or falls.  She does not occasional mild lightheadedness if she stands too fast. Weight down 1 lb.  She also has chronic knee and low back pain. She is back in atrial fibrillation again today, she does not feel palpitations.   ECG (personally reviewed): atrial fibrillation, LAFB, nonspecific T wave changes, rate 98 bpm  Labs (3/15): K 4.5, creatinine 0.84, LDL 91, HDL 46 Labs (5/15): K 3.9, creatinine 1.1 Labs (12/15): LDL 80, HDL 28, hemoglobin 10.9 Labs (1/16): K 3.7, creatinine 0.8 Labs (2/16): K 3.6, creatinine 1.2, HCT 34.4 Labs (6/16): K 4.1, creatinine 1.24 Labs (7/17): K 4.1, creatinine 1.2, HCT 38.8, LDL 143 Labs (1/18): LDL 142 Labs (5/18): K 4.4,  creatinine 1.0 Labs (1/20): K 3.9, creatinine 1.27, LDL 95 Labs (8/20): K 3.8, creatinine 2.06, LFTs normal Labs (12/20): K 4.1, creatinine 1.14 Labs (2/21): LDL 121 Labs (3/21): K 4.4, creatinine 1.27 Labs (01/23/21): K 3.5 Creatinine 1.2, TSH normal, BNP 385  PMH: 1. CAD: Unstable angina 3/15 with LHC showing 99% mRCA stenosis, treated with DES to mRCA.  2. Hypothyroidism 3. Diverticulosis 4. HTN 5. H/o TAH/BSO 6. Breast cancer: s/p  lumpectomy with lymph node biopsy in 2/15.  4/10 nodes positive.  She was started on docetaxol/carboplatin/Herceptin in 3/15 with plan for 6 cycles chemo.  7. Hypertrophic obstructive cardiomyopathy: Echo (3/15) with severe focal basal septal hypertrophy (22 mm), narrow LV outflow tract with mitral valve SAM and 58 mmHg peak LVOT resting gradient, EF 60-65%, moderate MR, moderate LAE, normal RV, lateral s' 10.4 cm/sec.  Patient says that her 2 grown sons has had echoes to screen for HOCM.  Echo (4/15) with EF 65-70%, severe focal basal septal hypertrophy, LVOT gradient 33 mmHg, SAM was present with moderate MR, normal RV size and systolic function, lateral S' 10.6, GLS -17.5%.  Cardiac MRI (5/15) with EF 65%, moderate asymmetric septal hypertrophy, systolic anterior motion of the mitral valve with moderate MR, there was no delayed enhancement.  Echo (7/15) with EF 65-70%, moderate ASH, peak LVOT gradient 30 mmHg, SAM with moderate MR, moderate to severe LAE.  Echo (11/15) with EF 55-60%, GLS -15%, no significant LVOT gradient, systolic anterior motion of the mitral valve with mild MR, normal RV size and systolic function. Echo (4/14) with EF 55-60%, severe asymmetric septal hypertrophy, LVOT gradient 20 mmHg, mild-moderate MR, GLS -18%.  - Echo (8/16): EF 60-65%, asymmetric septal hypertrophy, no significant LV outflow tract gradient, mild MR.   - Echo (4/18): EF 55-60%, small LVOT gradient with severe asymmetric septal hypertrophy, mild MR, PASP 32 mmHg.   - TEE (1/20): EF 55-60%, moderate-severe asymmetric septal hypertrophy, no LVOT gradient, normal RV size and systolic function, severe LAE, severe MR with posterior MV leaflet prolapse and minimal SAM.   - Echo (6/20): EF 60-65%, severe asymmetric septal hypertrophy, normal RV, s/p Mitraclip with mean gradient 3 mmHg and trivial MR.   - Echo (3/21): EF 65%, asymmetric septal hypertrophy with LVOT gradient 37 mmHg, SAM of clipped segment of the mitral valve,  normal RV, severe LAE, s/p Mitraclip x 2 with mild MR/no MS.  - Echo (5/22): EF 60-65% with several focal basal septal hypertrophy, LVOT gradient was not measured, s/p Mitraclip with mean gradient 6 mmHg and mild-moderate MR, RV normal, PASP 38 mmHg.  8. Hyperlipidemia: Myalgias with atorvastatin, myalgias with Repatha.  9. Atrial fibrillation: Paroxysmal, first noted in 1/20.  - DCCV 11/21.  10. Mitral regurgitation: Appears severe on 1/20 TEE.  Has posterior leaflet prolapse with minimal SAM.  - Mitraclip 3/20.   SH: Married, 2 children, retired, nonsmoker.   FH: No family history of HOCM or sudden death.  There is a family history of CAD.   ROS: All systems reviewed and negative except as per HPI.    Current Outpatient Medications  Medication Sig Dispense Refill  . amiodarone (PACERONE) 200 MG tablet Take 1 tablet by mouth 2 times daily for 2 weeks then take 1 tablet by mouth daily. 58 tablet 5  . Calcium Carbonate-Vitamin D (CALCIUM-D PO) Take 1 tablet by mouth daily.    Marland Kitchen diltiazem (CARDIZEM CD) 120 MG 24 hr capsule TAKE 1 CAPSULE BY MOUTH ONCE DAILY 30 capsule 11  .  ELIQUIS 5 MG TABS tablet TAKE 1 TABLET BY MOUTH TWICE (2) DAILY 180 tablet 1  . ezetimibe (ZETIA) 10 MG tablet TAKE 1 TABLET BY MOUTH ONCE DAILY 90 tablet 0  . furosemide (LASIX) 40 MG tablet Take 1 tablet (40 mg total) by mouth as needed for fluid or edema. For weight 144 or greater 30 tablet 5  . levothyroxine (SYNTHROID, LEVOTHROID) 137 MCG tablet Take 1 tablet (137 mcg total) by mouth daily before breakfast. 90 tablet 3  . metoprolol succinate (TOPROL-XL) 25 MG 24 hr tablet Take 1 tablet (25 mg total) by mouth 2 (two) times daily. Take with or immediately following a meal. 60 tablet 3  . Multiple Vitamin (MULTIVITAMIN WITH MINERALS) TABS tablet Take 1 tablet by mouth daily.    . nitroGLYCERIN (NITROSTAT) 0.4 MG SL tablet Place 1 tablet (0.4 mg total) under the tongue every 5 (five) minutes as needed for chest pain. 100  tablet 3  . potassium chloride (K-DUR) 10 MEQ tablet Take 2 tablets (20 mEq total) by mouth as needed. Only take 20 meq if you take lasix 60 tablet 5  . exemestane (AROMASIN) 25 MG tablet TAKE 1 TABLET BY MOUTH ONCE DAILY AFTER BREAKFAST 90 tablet 3   No current facility-administered medications for this encounter.   BP 124/78   Pulse 81   Wt 67 kg (147 lb 12.8 oz)   LMP 10/19/1991   SpO2 97%   BMI 22.47 kg/m   Vitals:   03/03/21 0924  BP: 124/78  Pulse: 81  SpO2: 97%   Wt Readings from Last 3 Encounters:  03/03/21 67 kg (147 lb 12.8 oz)  01/27/21 67.1 kg (148 lb)  01/23/21 63.5 kg (140 lb)    General: NAD Neck: No JVD, no thyromegaly or thyroid nodule.  Lungs: Clear to auscultation bilaterally with normal respiratory effort. CV: Nondisplaced PMI.  Heart regular S1/S2, no S3/S4, 1/6 SEM RUSB.  No peripheral edema.  No carotid bruit.  Normal pedal pulses.  Abdomen: Soft, nontender, no hepatosplenomegaly, no distention.  Skin: Intact without lesions or rashes.  Neurologic: Alert and oriented x 3.  Psych: Normal affect. Extremities: No clubbing or cyanosis.  HEENT: Normal.   Assessment/Plan: 1. CAD: Status post PCI for unstable angina in 3/15 with DES to [facility]. No chest pain.   - No ASA, taking Eliquis.    - Unable to tolerate statins or Repatha, she is on Zetia.  2. Hyperlipidemia: Myalgias with Crestor, Lipitor, and pravastatin.  Myalgias with Repatha.  She has tolerated Zetia 10 mg daily.   - Check lipids today.  3. Hypertrophic obstructive cardiomyopathy: No family history of HOCM or sudden death (interestingly, her husband has HOCM). Cardiac MRI in 5/15 showed no delayed enhancement.  Echo in 3/21 (post-Mitraclip) showed asymmetric septal hypertrophy with LVOT gradient peak 37 mmHg, SAM of the clipped segment of the mitral valve with mild MR, no MS.  Echo was done today showing asymmetric septal hypertrophy but LVOT gradient was not measured.  - Continue diltiazem CD 120  mg daily.  - Continue Toprol XL 25 mg bid.   - Per patient, her 2 sons have had screening echoes to look for hypertrophic cardiomyopathy and did not show signs of HOCM.    4. Atrial fibrillation: Noted initially in 1/20, suspect this was triggered by mitral regurgitation and HCM.  She was cardioverted to NSR.  She was on amiodarone for a few months, this was stopped after Mitraclip. In A fib 07/07/20. Had successful to  cardioversion 08/07/20.  Today, she notes increased fatigue and is back in atrial fibrillation again.  She has not missed any Eliquis doses.  - Restart amiodarone 200 mg bid x 2 wks then 200 mg daily.    - Continue Eliquis.  - I will arrange for DCCV next week (no TEE as she has not missed Eliquis).  We discussed risks/benefits and she agrees to procedure.  5. Chronic diastolic CHF:  Echo today with normal EF and normal RV, suspect she still has LVOT gradient though it was not measured on echo today.  She is not volume overloaded on exam, suspect increased fatigue is due to going into atrial fibrillation.   - She does not need regular Lasix.  - Restore NSR, as above.  6. Mitral regurgitation: Suspect that mitral regurgitation may have been a trigger for atrial fibrillation.  I think the mechanism of MR was most likely mitral valve prolapse.She has now had Mitraclip placement, echo today showed mild-moderate MR, mild MS with mean gradient 6 mmHg.  - Antibiotics with dental work.  7. Syncope: No recurrence since decreasing Toprol XL dose.  ?Orthostatic.   - Would arrange for Zio patch x 2 wks after DCCV.   Followup in 1 month.   [name] 03/03/2021

## 2021-03-05 ENCOUNTER — Telehealth: Payer: Self-pay | Admitting: Hematology

## 2021-03-05 NOTE — Telephone Encounter (Signed)
Cancelled appts per 6/2 sch msg. Pt is transferring care to Dr. Marin Olp in [facility].

## 2021-03-13 ENCOUNTER — Ambulatory Visit (HOSPITAL_COMMUNITY): Payer: Medicare PPO | Admitting: Anesthesiology

## 2021-03-13 ENCOUNTER — Other Ambulatory Visit: Payer: Self-pay

## 2021-03-13 ENCOUNTER — Encounter (HOSPITAL_COMMUNITY): Payer: Self-pay | Admitting: Cardiology

## 2021-03-13 ENCOUNTER — Ambulatory Visit (HOSPITAL_COMMUNITY)
Admission: RE | Admit: 2021-03-13 | Discharge: 2021-03-13 | Disposition: A | Discharge status: Home / Self Care | Payer: Medicare PPO | Attending: Cardiology | Admitting: Cardiology

## 2021-03-13 ENCOUNTER — Encounter (HOSPITAL_COMMUNITY)
Admission: RE | Disposition: A | Discharge status: Home / Self Care | Payer: Self-pay | Source: Home / Self Care | Attending: Cardiology

## 2021-03-13 DIAGNOSIS — Z955 Presence of coronary angioplasty implant and graft: Secondary | ICD-10-CM | POA: Insufficient documentation

## 2021-03-13 DIAGNOSIS — Z79899 Other long term (current) drug therapy: Secondary | ICD-10-CM | POA: Diagnosis not present

## 2021-03-13 DIAGNOSIS — I2510 Atherosclerotic heart disease of native coronary artery without angina pectoris: Secondary | ICD-10-CM | POA: Insufficient documentation

## 2021-03-13 DIAGNOSIS — I509 Heart failure, unspecified: Secondary | ICD-10-CM | POA: Diagnosis not present

## 2021-03-13 DIAGNOSIS — I340 Nonrheumatic mitral (valve) insufficiency: Secondary | ICD-10-CM | POA: Insufficient documentation

## 2021-03-13 DIAGNOSIS — I480 Paroxysmal atrial fibrillation: Secondary | ICD-10-CM | POA: Diagnosis not present

## 2021-03-13 DIAGNOSIS — Z79890 Hormone replacement therapy (postmenopausal): Secondary | ICD-10-CM | POA: Diagnosis not present

## 2021-03-13 DIAGNOSIS — E785 Hyperlipidemia, unspecified: Secondary | ICD-10-CM | POA: Diagnosis not present

## 2021-03-13 DIAGNOSIS — I5032 Chronic diastolic (congestive) heart failure: Secondary | ICD-10-CM | POA: Insufficient documentation

## 2021-03-13 DIAGNOSIS — Z8249 Family history of ischemic heart disease and other diseases of the circulatory system: Secondary | ICD-10-CM | POA: Insufficient documentation

## 2021-03-13 DIAGNOSIS — Z7901 Long term (current) use of anticoagulants: Secondary | ICD-10-CM | POA: Insufficient documentation

## 2021-03-13 DIAGNOSIS — I421 Obstructive hypertrophic cardiomyopathy: Secondary | ICD-10-CM | POA: Insufficient documentation

## 2021-03-13 DIAGNOSIS — R55 Syncope and collapse: Secondary | ICD-10-CM | POA: Insufficient documentation

## 2021-03-13 DIAGNOSIS — I110 Hypertensive heart disease with heart failure: Secondary | ICD-10-CM | POA: Diagnosis not present

## 2021-03-13 DIAGNOSIS — I4891 Unspecified atrial fibrillation: Secondary | ICD-10-CM | POA: Diagnosis not present

## 2021-03-13 DIAGNOSIS — I4819 Other persistent atrial fibrillation: Secondary | ICD-10-CM | POA: Diagnosis not present

## 2021-03-13 HISTORY — PX: CARDIOVERSION: SHX1299

## 2021-03-13 SURGERY — CARDIOVERSION
Anesthesia: General

## 2021-03-13 MED ORDER — POTASSIUM CHLORIDE ER 10 MEQ PO TBCR: 20.0000 meq | EXTENDED_RELEASE_TABLET | Freq: Every day | ORAL | 6 refills | Status: DC | PRN

## 2021-03-13 MED ORDER — LIDOCAINE 2% (20 MG/ML) 5 ML SYRINGE
INTRAMUSCULAR | Status: DC | PRN
  Administered 2021-03-13: 60 mg via INTRAVENOUS

## 2021-03-13 MED ORDER — PROPOFOL 10 MG/ML IV BOLUS
INTRAVENOUS | Status: DC | PRN
  Administered 2021-03-13: 40 mg via INTRAVENOUS

## 2021-03-13 MED ORDER — SODIUM CHLORIDE 0.9 % IV SOLN: INTRAVENOUS | Status: DC | PRN

## 2021-03-13 NOTE — Discharge Instructions (Signed)
Electrical Cardioversion  Electrical cardioversion is the delivery of a jolt of electricity to restore a normal rhythm to the heart. A rhythm that is too fast or is not regular keeps the heart from pumping well. In this procedure, sticky patches or metal paddles are placed on the chest to deliver electricity to the heart from a device.  What can I expect after the procedure?  Your blood pressure, heart rate, breathing rate, and blood oxygen level will be monitored until you leave the hospital or clinic.  Your heart rhythm will be watched to make sure it does not change.  You may have some redness on the skin where the shocks were given.If this occurs, can use hydrocortisone cream or Aloe vera.  Follow these instructions at home:  Do not drive for 24 hours if you were given a sedative during your procedure.  Take over-the-counter and prescription medicines only as told by your health care provider.  Ask your health care provider how to check your pulse. Check it often.  Rest for 48 hours after the procedure or as told by your health care provider.  Avoid or limit your caffeine use as told by your health care provider.  Keep all follow-up visits as told by your health care provider. This is important.  Contact a health care provider if:  You feel like your heart is beating too quickly or your pulse is not regular.  You have a serious muscle cramp that does not go away.  Get help right away if:  You have discomfort in your chest.  You are dizzy or you feel faint.  You have trouble breathing or you are short of breath.  Your speech is slurred.  You have trouble moving an arm or leg on one side of your body.  Your fingers or toes turn cold or blue.  Summary  Electrical cardioversion is the delivery of a jolt of electricity to restore a normal rhythm to the heart.  This procedure may be done right away in an emergency or may be a scheduled procedure if the condition is not  an emergency.  Generally, this is a safe procedure.  After the procedure, check your pulse often as told by your health care provider.  This information is not intended to replace advice given to you by your health care provider. Make sure you discuss any questions you have with your health care provider. Document Revised: 04/23/2019 Document Reviewed: 04/23/2019 Elsevier Patient Education  2021 Elsevier Inc.  

## 2021-03-13 NOTE — Anesthesia Postprocedure Evaluation (Signed)
Anesthesia Post Note  Patient: Kim [name]  Procedure(s) Performed: CARDIOVERSION     Patient location during evaluation: Endoscopy Anesthesia Type: General Level of consciousness: awake and alert Pain management: pain level controlled Vital Signs Assessment: post-procedure vital signs reviewed and stable Respiratory status: spontaneous breathing, nonlabored ventilation and respiratory function stable Cardiovascular status: blood pressure returned to baseline and stable Postop Assessment: no apparent nausea or vomiting Anesthetic complications: no   No notable events documented.  Last Vitals:  Vitals:   03/13/21 1018 03/13/21 1028  BP: (!) 150/75 (!) 161/80  Pulse: 71   Resp: 15 16  Temp:    SpO2: 99% 100%    Last Pain:  Vitals:   03/13/21 1028  TempSrc:   PainSc: 0-No pain                 Indigo Chaddock,W. EDMOND

## 2021-03-13 NOTE — Interval H&P Note (Signed)
History and Physical Interval Note:  03/13/2021 9:59 AM  Kim [name]  has presented today for surgery, with the diagnosis of A-FIB.  The various methods of treatment have been discussed with the patient and family. After consideration of risks, benefits and other options for treatment, the patient has consented to  Procedure(s): CARDIOVERSION (N/A) as a surgical intervention.  The patient's history has been reviewed, patient examined, no change in status, stable for surgery.  I have reviewed the patient's chart and labs.  Questions were answered to the patient's satisfaction.     Kaori Jumper [employer]

## 2021-03-13 NOTE — Procedures (Signed)
Electrical Cardioversion Procedure Note Kim [name] [id number] [birth year]  Procedure: Electrical Cardioversion Indications:  Atrial Fibrillation  Procedure Details Consent: Risks of procedure as well as the alternatives and risks of each were explained to the (patient/caregiver).  Consent for procedure obtained. Time Out: Verified patient identification, verified procedure, site/side was marked, verified correct patient position, special equipment/implants available, medications/allergies/relevent history reviewed, required imaging and test results available.  Performed  Patient placed on cardiac monitor, pulse oximetry, supplemental oxygen as necessary.  Sedation given:  Per anesthesiology Pacer pads placed anterior and posterior chest.  Cardioverted 1 time(s).  Cardioverted at [place].  Evaluation Findings: Post procedure EKG shows: NSR Complications: None Patient did tolerate procedure well.   [name] 03/13/2021, 10:04 AM

## 2021-03-13 NOTE — Anesthesia Preprocedure Evaluation (Addendum)
Anesthesia Evaluation  Patient identified by MRN, date of birth, ID band Patient awake    Reviewed: Allergy & Precautions, H&P , NPO status , Patient's Chart, lab work & pertinent test results, reviewed documented beta blocker date and time   Airway Mallampati: II  TM Distance: >3 FB Neck ROM: Full    Dental no notable dental hx. (+) Teeth Intact, Dental Advisory Given   Pulmonary neg pulmonary ROS,    Pulmonary exam normal breath sounds clear to auscultation       Cardiovascular hypertension, Pt. on medications and Pt. on home beta blockers + CAD, + Cardiac Stents and +CHF  + dysrhythmias Atrial Fibrillation + Valvular Problems/Murmurs MR  Rhythm:Irregular Rate:Normal  HOCM   Neuro/Psych negative neurological ROS  negative psych ROS   GI/Hepatic negative GI ROS, Neg liver ROS,   Endo/Other  Hypothyroidism   Renal/GU negative Renal ROS  negative genitourinary   Musculoskeletal  (+) Arthritis ,   Abdominal   Peds  Hematology  (+) Blood dyscrasia, anemia ,   Anesthesia Other Findings   Reproductive/Obstetrics negative OB ROS                            Anesthesia Physical Anesthesia Plan  ASA: 3  Anesthesia Plan: General   Post-op Pain Management:    Induction: Intravenous  PONV Risk Score and Plan: 3 and Propofol infusion and Treatment may vary due to age or medical condition  Airway Management Planned: Mask  Additional Equipment:   Intra-op Plan:   Post-operative Plan:   Informed Consent: I have reviewed the patients History and Physical, chart, labs and discussed the procedure including the risks, benefits and alternatives for the proposed anesthesia with the patient or authorized representative who has indicated his/her understanding and acceptance.     Dental advisory given  Plan Discussed with: CRNA  Anesthesia Plan Comments:         Anesthesia Quick  Evaluation

## 2021-03-13 NOTE — Anesthesia Procedure Notes (Signed)
Procedure Name: General with mask airway Date/Time: 03/13/2021 10:00 AM Performed by: Imagene Riches, CRNA Pre-anesthesia Checklist: Patient identified, Emergency Drugs available, Suction available, Patient being monitored and Timeout performed Patient Re-evaluated:Patient Re-evaluated prior to induction Oxygen Delivery Method: Ambu bag Preoxygenation: Pre-oxygenation with 100% oxygen

## 2021-03-13 NOTE — Transfer of Care (Signed)
Immediate Anesthesia Transfer of Care Note  Patient: Kim [name]  Procedure(s) Performed: CARDIOVERSION  Patient Location: Endoscopy Unit  Anesthesia Type:General  Level of Consciousness: drowsy  Airway & Oxygen Therapy: Patient Spontanous Breathing  Post-op Assessment: Report given to RN and Post -op Vital signs reviewed and stable  Post vital signs: Reviewed and stable  Last Vitals:  Vitals Value Taken Time  BP    Temp    Pulse    Resp    SpO2      Last Pain:  Vitals:   03/13/21 0930  PainSc: 0-No pain         Complications: No notable events documented.

## 2021-04-02 ENCOUNTER — Telehealth (HOSPITAL_COMMUNITY): Payer: Self-pay | Admitting: Cardiology

## 2021-04-07 ENCOUNTER — Encounter (HOSPITAL_COMMUNITY): Payer: Medicare PPO | Admitting: Cardiology

## 2021-04-09 ENCOUNTER — Telehealth (HOSPITAL_COMMUNITY): Payer: Self-pay | Admitting: Surgery

## 2021-04-09 ENCOUNTER — Ambulatory Visit (HOSPITAL_COMMUNITY)
Admission: RE | Admit: 2021-04-09 | Discharge: 2021-04-09 | Disposition: A | Discharge status: Home / Self Care | Payer: Medicare PPO | Source: Ambulatory Visit | Attending: Cardiology | Admitting: Cardiology

## 2021-04-09 ENCOUNTER — Encounter (HOSPITAL_COMMUNITY): Payer: Self-pay | Admitting: Cardiology

## 2021-04-09 ENCOUNTER — Other Ambulatory Visit: Payer: Self-pay

## 2021-04-09 VITALS — BP 112/62 | HR 70 | Ht 68.0 in | Wt 149.0 lb

## 2021-04-09 DIAGNOSIS — I340 Nonrheumatic mitral (valve) insufficiency: Secondary | ICD-10-CM

## 2021-04-09 DIAGNOSIS — I110 Hypertensive heart disease with heart failure: Secondary | ICD-10-CM | POA: Diagnosis not present

## 2021-04-09 DIAGNOSIS — I2510 Atherosclerotic heart disease of native coronary artery without angina pectoris: Secondary | ICD-10-CM | POA: Insufficient documentation

## 2021-04-09 DIAGNOSIS — I480 Paroxysmal atrial fibrillation: Secondary | ICD-10-CM | POA: Diagnosis not present

## 2021-04-09 DIAGNOSIS — E785 Hyperlipidemia, unspecified: Secondary | ICD-10-CM | POA: Diagnosis not present

## 2021-04-09 DIAGNOSIS — Z955 Presence of coronary angioplasty implant and graft: Secondary | ICD-10-CM | POA: Diagnosis not present

## 2021-04-09 DIAGNOSIS — I5032 Chronic diastolic (congestive) heart failure: Secondary | ICD-10-CM

## 2021-04-09 DIAGNOSIS — E039 Hypothyroidism, unspecified: Secondary | ICD-10-CM

## 2021-04-09 DIAGNOSIS — Z7901 Long term (current) use of anticoagulants: Secondary | ICD-10-CM | POA: Diagnosis not present

## 2021-04-09 DIAGNOSIS — Z79899 Other long term (current) drug therapy: Secondary | ICD-10-CM | POA: Insufficient documentation

## 2021-04-09 DIAGNOSIS — R55 Syncope and collapse: Secondary | ICD-10-CM | POA: Diagnosis not present

## 2021-04-09 LAB — COMPREHENSIVE METABOLIC PANEL
ALT: 11 U/L (ref 0–44)
AST: 14 U/L — ABNORMAL LOW (ref 15–41)
Albumin: 4 g/dL (ref 3.5–5.0)
Alkaline Phosphatase: 60 U/L (ref 38–126)
Anion gap: 9 (ref 5–15)
BUN: 23 mg/dL (ref 8–23)
CO2: 26 mmol/L (ref 22–32)
Calcium: 9.4 mg/dL (ref 8.9–10.3)
Chloride: 100 mmol/L (ref 98–111)
Creatinine, Ser: 1.31 mg/dL — ABNORMAL HIGH (ref 0.44–1.00)
GFR, Estimated: 40 mL/min — ABNORMAL LOW (ref >60)
Glucose, Bld: 109 mg/dL — ABNORMAL HIGH (ref 70–99)
Potassium: 4.1 mmol/L (ref 3.5–5.1)
Sodium: 135 mmol/L (ref 135–145)
Total Bilirubin: 1.2 mg/dL (ref 0.3–1.2)
Total Protein: 6.6 g/dL (ref 6.5–8.1)

## 2021-04-09 LAB — TSH: TSH: 0.521 u[IU]/mL (ref 0.350–4.500)

## 2021-04-09 NOTE — Telephone Encounter (Signed)
I attempted to reach Kim [name] regarding a question that she had in reference to billing.  I left a message for a return call.

## 2021-04-09 NOTE — Patient Instructions (Signed)
Labs done today.  We will contact you only if your labs are abnormal.   No medication changes were made. Please continue all current medications as prescribed.  Your physician recommends that you schedule a follow-up appointment in: 3 months  If you have any questions or concerns before your next appointment please send us a message through mychart or call our office at [phone].    TO LEAVE A MESSAGE FOR THE NURSE SELECT OPTION 2, PLEASE LEAVE A MESSAGE INCLUDING: YOUR NAME DATE OF BIRTH CALL BACK NUMBER REASON FOR CALL**this is important as we prioritize the call backs  YOU WILL RECEIVE A CALL BACK THE SAME DAY AS LONG AS YOU CALL BEFORE 4:00 PM   Do the following things EVERYDAY: Weigh yourself in the morning before breakfast. Write it down and keep it in a log. Take your medicines as prescribed Eat low salt foods--Limit salt (sodium) to 2000 mg per day.  Stay as active as you can everyday Limit all fluids for the day to less than 2 liters   At the [facility], you and your health needs are our priority. As part of our continuing mission to provide you with exceptional heart care, we have created designated Provider Care Teams. These Care Teams include your primary Cardiologist (physician) and Advanced Practice Providers (APPs- Physician Assistants and Nurse Practitioners) who all work together to provide you with the care you need, when you need it.   You may see any of the following providers on your designated Care Team at your next follow up: Dr Daniel Bensimhon Dr Dalton McLean Amy Clegg, NP Brittainy Simmons, PA Lauren Kemp, PharmD   Please be sure to bring in all your medications bottles to every appointment.   

## 2021-04-09 NOTE — Progress Notes (Signed)
Patient ID: Kim [name], female   DOB: [birth year], 86 y.o.   MRN: [id number] PCP: Dr. Sarajane Jews Oncologist: Dr. Marin Olp Cardiology: Dr. Aundra Dubin  86 y.o. with history of CAD, HCM, paroxysmal atrial fibrillation, and mitral regurgitation presents for followup of HCM, CHF, MR.  Patient had breast cancer in 2015 and received treatment involving Herceptin.  During breast cancer treatment, she developed unstable angina and ended up getting a DES to the mid RCA in 3/15.  Patient additionally has a history of HOCM.  This has been recognized on prior echoes.  She has severe asymmetric basal septal hypertrophy and SAM with LVOT gradient peak 58 mmHg on echo in 3/15 along with moderate MR.  Repeat echo in 7/15 showed LVOT gradient down to 30 mmHg on higher beta blocker.  Repeat echo in 11/15 showed no significant LVOT gradient but SAM still present.  Echo (8/16) showed asymmetric septal hypertrophy, No SAM, no significant LVOT gradient.  Echo 4/18 showed EF 55-60%, small LVOT gradient with severe asymmetric septal hypertrophy, mild MR, PASP 32 mmHg.    She was admitted in 1/20 with symptomatic atrial fibrillation with RVR, this was a new diagnosis.  She was started on Eliquis and cardioverted after TEE back to NSR.  TEE showed severe mitral regurgitation that appeared to be due mostly to posterior leaflet prolapse, there was minimal systolic anterior motion.   In 3/20, she had Mitraclip placement. Echo in 6/20 showed EF 60-65%, severe asymmetric septal hypertrophy, normal RV, s/p Mitraclip with mean gradient 3 mmHg and trivial MR.  Echo in 3/21 showed EF 65%, asymmetric septal hypertrophy with LVOT gradient 37 mmHg, SAM of clipped segment of the mitral valve, normal RV, severe LAE, s/p Mitraclip x 2 with mild MR/no MS.   In October she went to [facility] ED on 07/07/20 for chest pain. She was in A fib at that time. Work up negative so she was discharged later that day.   Had cardioversion 08/07/20 --> NSR  Evaluated in the  ED on 01/23/21 for  presyncope on 01/22/21 and syncope 01/23/21. She does not recall tripping or dizziness. Bruising noted on face/arms. No acute findings. She declined cardiology consultation and preferred to be seen in the clinic. She was supposed to wear Zio patch but this was never done.  Carotid dopplers showed no significant disease in 5/22.   Echo in 5/22 showed EF 60-65% with several focal basal septal hypertrophy, LVOT gradient was not measured, s/p Mitraclip with mean gradient 6 mmHg and mild-moderate MR, RV normal, PASP 38 mmHg.   She developed symptomatic atrial fibrillation again and underwent DCCV in 6/22 on amiodarone.   She returns for followup of CHF, hypertrophic cardiomyopathy, atrial fibrillation.  She is in NSR.  She feels much better in NSR.  No dyspnea walking on flat ground or with ADLs.  No palpitations.  No chest pain. Rare lightheadedness.   ECG (personally reviewed): NSR, nonspecific ST changes  Labs (3/15): K 4.5, creatinine 0.84, LDL 91, HDL 46 Labs (5/15): K 3.9, creatinine 1.1 Labs (12/15): LDL 80, HDL 28, hemoglobin 10.9 Labs (1/16): K 3.7, creatinine 0.8 Labs (2/16): K 3.6, creatinine 1.2, HCT 34.4 Labs (6/16): K 4.1, creatinine 1.24 Labs (7/17): K 4.1, creatinine 1.2, HCT 38.8, LDL 143 Labs (1/18): LDL 142 Labs (5/18): K 4.4, creatinine 1.0 Labs (1/20): K 3.9, creatinine 1.27, LDL 95 Labs (8/20): K 3.8, creatinine 2.06, LFTs normal Labs (12/20): K 4.1, creatinine 1.14 Labs (2/21): LDL 121 Labs (3/21): K 4.4, creatinine 1.27  Labs (01/23/21): K 3.5 Creatinine 1.2, TSH normal, BNP 385 Labs (5/22): K 4.3, creatinine 1.26, hgb 11.7, LDL 122  PMH: 1. CAD: Unstable angina 3/15 with LHC showing 99% mRCA stenosis, treated with DES to mRCA.  2. Hypothyroidism 3. Diverticulosis 4. HTN 5. H/o TAH/BSO 6. Breast cancer: s/p lumpectomy with lymph node biopsy in 2/15.  4/10 nodes positive.  She was started on docetaxol/carboplatin/Herceptin in 3/15 with plan for 6  cycles chemo.  7. Hypertrophic obstructive cardiomyopathy: Echo (3/15) with severe focal basal septal hypertrophy (22 mm), narrow LV outflow tract with mitral valve SAM and 58 mmHg peak LVOT resting gradient, EF 60-65%, moderate MR, moderate LAE, normal RV, lateral s' 10.4 cm/sec.  Patient says that her 2 grown sons has had echoes to screen for HOCM.  Echo (4/15) with EF 65-70%, severe focal basal septal hypertrophy, LVOT gradient 33 mmHg, SAM was present with moderate MR, normal RV size and systolic function, lateral S' 10.6, GLS -17.5%.  Cardiac MRI (5/15) with EF 65%, moderate asymmetric septal hypertrophy, systolic anterior motion of the mitral valve with moderate MR, there was no delayed enhancement.  Echo (7/15) with EF 65-70%, moderate ASH, peak LVOT gradient 30 mmHg, SAM with moderate MR, moderate to severe LAE.  Echo (11/15) with EF 55-60%, GLS -15%, no significant LVOT gradient, systolic anterior motion of the mitral valve with mild MR, normal RV size and systolic function. Echo (4/14) with EF 55-60%, severe asymmetric septal hypertrophy, LVOT gradient 20 mmHg, mild-moderate MR, GLS -18%.  - Echo (8/16): EF 60-65%, asymmetric septal hypertrophy, no significant LV outflow tract gradient, mild MR.   - Echo (4/18): EF 55-60%, small LVOT gradient with severe asymmetric septal hypertrophy, mild MR, PASP 32 mmHg.   - TEE (1/20): EF 55-60%, moderate-severe asymmetric septal hypertrophy, no LVOT gradient, normal RV size and systolic function, severe LAE, severe MR with posterior MV leaflet prolapse and minimal SAM.   - Echo (6/20): EF 60-65%, severe asymmetric septal hypertrophy, normal RV, s/p Mitraclip with mean gradient 3 mmHg and trivial MR.   - Echo (3/21): EF 65%, asymmetric septal hypertrophy with LVOT gradient 37 mmHg, SAM of clipped segment of the mitral valve, normal RV, severe LAE, s/p Mitraclip x 2 with mild MR/no MS.  - Echo (5/22): EF 60-65% with several focal basal septal hypertrophy, LVOT  gradient was not measured, s/p Mitraclip with mean gradient 6 mmHg and mild-moderate MR, RV normal, PASP 38 mmHg.  8. Hyperlipidemia: Myalgias with atorvastatin, myalgias with Repatha.  9. Atrial fibrillation: Paroxysmal, first noted in 1/20.  - DCCV 11/21.  - DCCV 6/22 10. Mitral regurgitation: Appears severe on 1/20 TEE.  Has posterior leaflet prolapse with minimal SAM.  - Mitraclip 3/20.   SH: Married, 2 children, retired, nonsmoker.   FH: No family history of HOCM or sudden death.  There is a family history of CAD.   ROS: All systems reviewed and negative except as per HPI.    Current Outpatient Medications  Medication Sig Dispense Refill   acetaminophen (TYLENOL) 650 MG CR tablet Take 1,300 mg by mouth every 8 (eight) hours as needed for pain.     amiodarone (PACERONE) 200 MG tablet Take 1 tablet by mouth 2 times daily for 2 weeks then take 1 tablet by mouth daily. 58 tablet 5   diltiazem (CARDIZEM CD) 120 MG 24 hr capsule TAKE 1 CAPSULE BY MOUTH ONCE DAILY 30 capsule 11   ELIQUIS 5 MG TABS tablet TAKE 1 TABLET BY MOUTH TWICE (2)  DAILY 180 tablet 1   exemestane (AROMASIN) 25 MG tablet TAKE 1 TABLET BY MOUTH ONCE DAILY AFTER BREAKFAST 90 tablet 3   ezetimibe (ZETIA) 10 MG tablet TAKE 1 TABLET BY MOUTH ONCE DAILY 90 tablet 3   furosemide (LASIX) 40 MG tablet Take 1 tablet (40 mg total) by mouth as needed for fluid or edema. For weight 144 or greater 30 tablet 5   levothyroxine (SYNTHROID, LEVOTHROID) 137 MCG tablet Take 1 tablet (137 mcg total) by mouth daily before breakfast. 90 tablet 3   metoprolol succinate (TOPROL-XL) 25 MG 24 hr tablet Take 1 tablet (25 mg total) by mouth 2 (two) times daily. Take with or immediately following a meal. 60 tablet 3   Multiple Vitamin (MULTIVITAMIN WITH MINERALS) TABS tablet Take 1 tablet by mouth daily.     nitroGLYCERIN (NITROSTAT) 0.4 MG SL tablet Place 1 tablet (0.4 mg total) under the tongue every 5 (five) minutes as needed for chest pain. 100  tablet 3   potassium chloride (KLOR-CON) 10 MEQ tablet Take 2 tablets (20 mEq total) by mouth daily as needed (Only take 20 meq if you take lasix). 90 tablet 6   No current facility-administered medications for this encounter.   BP 112/62   Pulse 70   Ht 5\' 8"  (1.727 m)   Wt 67.6 kg (149 lb)   LMP 10/19/1991   SpO2 97%   BMI 22.66 kg/m   Vitals:   04/09/21 1120  BP: 112/62  Pulse: 70  SpO2: 97%   Wt Readings from Last 3 Encounters:  04/09/21 67.6 kg (149 lb)  03/03/21 67 kg (147 lb 12.8 oz)  01/27/21 67.1 kg (148 lb)   General: NAD Neck: No JVD, no thyromegaly or thyroid nodule.  Lungs: Clear to auscultation bilaterally with normal respiratory effort. CV: Nondisplaced PMI.  Heart regular S1/S2, no S3/S4, 1/6 SEM RUSB.  No peripheral edema.  No carotid bruit.  Normal pedal pulses.  Abdomen: Soft, nontender, no hepatosplenomegaly, no distention.  Skin: Intact without lesions or rashes.  Neurologic: Alert and oriented x 3.  Psych: Normal affect. Extremities: No clubbing or cyanosis.  HEENT: Normal.   Assessment/Plan: 1. CAD: Status post PCI for unstable angina in 3/15 with DES to [facility]. No chest pain.   - No ASA, taking Eliquis.    - Unable to tolerate statins or Repatha, she is on Zetia.  2. Hyperlipidemia: Myalgias with Crestor, Lipitor, and pravastatin.  Myalgias with Repatha.  She has tolerated Zetia 10 mg daily.   3. Hypertrophic obstructive cardiomyopathy: No family history of HOCM or sudden death (interestingly, her husband has HOCM). Cardiac MRI in 5/15 showed no delayed enhancement.  Echo in 3/21 (post-Mitraclip) showed asymmetric septal hypertrophy with LVOT gradient peak 37 mmHg, SAM of the clipped segment of the mitral valve with mild MR, no MS.  Echo was done today showing asymmetric septal hypertrophy but LVOT gradient was not measured.  - Continue diltiazem CD 120 mg daily.  - Continue Toprol XL 25 mg bid.   - Per patient, her 2 sons have had screening echoes to  look for hypertrophic cardiomyopathy and did not show signs of HOCM.    4. Atrial fibrillation: Noted initially in 1/20, suspect this was triggered by mitral regurgitation and HCM.  She was cardioverted to NSR.  She was on amiodarone for a few months, this was stopped after Mitraclip. In A fib 07/07/20. Had successful cardioversion 08/07/20 and in 6/22.  She remains in NSR on amiodarone.  -  Continue amiodarone 200 mg daily.  Check LFTs and TSH today, she will need regular eye exam.  - Continue Eliquis.  5. Chronic diastolic CHF:  Echo today with normal EF and normal RV, suspect she still has LVOT gradient though it was not measured on echo today.  She is not volume overloaded on exam, feels better in NSR.    - She does not need regular Lasix.  6. Mitral regurgitation: Suspect that mitral regurgitation may have been a trigger for atrial fibrillation.  I think the mechanism of MR was most likely mitral valve prolapse. She has now had Mitraclip placement, echo in 5/22 showed mild-moderate MR, mild MS with mean gradient 6 mmHg.  - Antibiotics with dental work.  7. Syncope: No recurrence since decreasing Toprol XL dose.  ?Orthostatic.    Followup in 3 months.   [name] 04/09/2021

## 2021-04-13 ENCOUNTER — Inpatient Hospital Stay: Payer: Medicare PPO | Attending: Hematology & Oncology

## 2021-04-13 ENCOUNTER — Encounter: Payer: Self-pay | Admitting: Hematology & Oncology

## 2021-04-13 ENCOUNTER — Inpatient Hospital Stay (HOSPITAL_BASED_OUTPATIENT_CLINIC_OR_DEPARTMENT_OTHER): Payer: Medicare PPO | Admitting: Hematology & Oncology

## 2021-04-13 ENCOUNTER — Other Ambulatory Visit: Payer: Self-pay

## 2021-04-13 ENCOUNTER — Telehealth: Payer: Self-pay

## 2021-04-13 VITALS — BP 158/73 | HR 60 | Temp 98.4°F | Resp 20 | Wt 148.0 lb

## 2021-04-13 DIAGNOSIS — Z170 Estrogen receptor positive status [ER+]: Secondary | ICD-10-CM | POA: Diagnosis not present

## 2021-04-13 DIAGNOSIS — Z79811 Long term (current) use of aromatase inhibitors: Secondary | ICD-10-CM | POA: Insufficient documentation

## 2021-04-13 DIAGNOSIS — D0512 Intraductal carcinoma in situ of left breast: Secondary | ICD-10-CM

## 2021-04-13 DIAGNOSIS — C50912 Malignant neoplasm of unspecified site of left female breast: Secondary | ICD-10-CM | POA: Diagnosis not present

## 2021-04-13 DIAGNOSIS — Z79899 Other long term (current) drug therapy: Secondary | ICD-10-CM | POA: Insufficient documentation

## 2021-04-13 DIAGNOSIS — C773 Secondary and unspecified malignant neoplasm of axilla and upper limb lymph nodes: Secondary | ICD-10-CM | POA: Diagnosis not present

## 2021-04-13 DIAGNOSIS — C50412 Malignant neoplasm of upper-outer quadrant of left female breast: Secondary | ICD-10-CM | POA: Diagnosis not present

## 2021-04-13 DIAGNOSIS — D500 Iron deficiency anemia secondary to blood loss (chronic): Secondary | ICD-10-CM

## 2021-04-13 LAB — CBC WITH DIFFERENTIAL (CANCER CENTER ONLY)
Abs Immature Granulocytes: 0.01 10*3/uL (ref 0.00–0.07)
Basophils Absolute: 0 10*3/uL (ref 0.0–0.1)
Basophils Relative: 1 %
Eosinophils Absolute: 0.1 10*3/uL (ref 0.0–0.5)
Eosinophils Relative: 2 %
HCT: 37 % (ref 36.0–46.0)
Hemoglobin: 11.7 g/dL — ABNORMAL LOW (ref 12.0–15.0)
Immature Granulocytes: 0 %
Lymphocytes Relative: 17 %
Lymphs Abs: 0.9 10*3/uL (ref 0.7–4.0)
MCH: 29.9 pg (ref 26.0–34.0)
MCHC: 31.6 g/dL (ref 30.0–36.0)
MCV: 94.6 fL (ref 80.0–100.0)
Monocytes Absolute: 0.5 10*3/uL (ref 0.1–1.0)
Monocytes Relative: 9 %
Neutro Abs: 3.9 10*3/uL (ref 1.7–7.7)
Neutrophils Relative %: 71 %
Platelet Count: 167 10*3/uL (ref 150–400)
RBC: 3.91 MIL/uL (ref 3.87–5.11)
RDW: 14.6 % (ref 11.5–15.5)
WBC Count: 5.5 10*3/uL (ref 4.0–10.5)
nRBC: 0 % (ref 0.0–0.2)

## 2021-04-13 LAB — CMP (CANCER CENTER ONLY)
ALT: 10 U/L (ref 0–44)
AST: 13 U/L — ABNORMAL LOW (ref 15–41)
Albumin: 4.4 g/dL (ref 3.5–5.0)
Alkaline Phosphatase: 59 U/L (ref 38–126)
Anion gap: 7 (ref 5–15)
BUN: 35 mg/dL — ABNORMAL HIGH (ref 8–23)
CO2: 29 mmol/L (ref 22–32)
Calcium: 10.1 mg/dL (ref 8.9–10.3)
Chloride: 101 mmol/L (ref 98–111)
Creatinine: 1.26 mg/dL — ABNORMAL HIGH (ref 0.44–1.00)
GFR, Estimated: 42 mL/min — ABNORMAL LOW (ref >60)
Glucose, Bld: 95 mg/dL (ref 70–99)
Potassium: 4.2 mmol/L (ref 3.5–5.1)
Sodium: 137 mmol/L (ref 135–145)
Total Bilirubin: 0.6 mg/dL (ref 0.3–1.2)
Total Protein: 6.6 g/dL (ref 6.5–8.1)

## 2021-04-13 LAB — FERRITIN: Ferritin: 29 ng/mL (ref 11–307)

## 2021-04-13 NOTE — Progress Notes (Signed)
Hematology and Oncology Follow Up Visit  Kim [name] [id number] Nov 29, 1934 86 y.o. 04/13/2021   Principle Diagnosis:  Stage IIA (T1N1M0) adenocarcinoma of the left breast-triple positive Iron deficiency anemia secondary to blood loss  Current Therapy:   Status post cycle 4 of Abraxane/Herceptin Maintenance Herceptin q 3wk - finish 03/25/2015 Prolia 60 mg sq every 6 months - due in 09/2021 Aromasin 25 mg by mouth daily  - start on 01/10/2017  Feraheme as needed-dose given today     Interim History:  [name] is back for followup.  She actually was last seen at the [facility].  She was not too pleased with that visit because of how crowded it was over there.  As such, she came back to visit [place].  Her son brought her.  He lives in [place].  He will be going back to [place] today.  Her husband passed away in 08-Sep-2023.  This is been quite difficult for her.  She actually may move down to [place].  She does have a daughter in town here.  She has had knee problems.  She will see the orthopedic surgeon in September.  She has had no problems with her heart.  There is been no issues with respect to congestive heart failure.  She has had no problems with cough.  She has had no exposure to COVID.  There is no bleeding.  She is on chronic anticoagulation for atrial fibrillation.  Currently, I would say performance status is ECOG 2.    Medications:  Current Outpatient Medications:    acetaminophen (TYLENOL) 650 MG CR tablet, Take 1,300 mg by mouth every 8 (eight) hours as needed for pain., Disp: , Rfl:    amiodarone (PACERONE) 200 MG tablet, Take 1 tablet by mouth 2 times daily for 2 weeks then take 1 tablet by mouth daily., Disp: 58 tablet, Rfl: 5   diltiazem (CARDIZEM CD) 120 MG 24 hr capsule, TAKE 1 CAPSULE BY MOUTH ONCE DAILY, Disp: 30 capsule, Rfl: 11   ELIQUIS 5 MG TABS tablet, TAKE 1 TABLET BY MOUTH TWICE (2) DAILY, Disp: 180 tablet, Rfl: 1   exemestane (AROMASIN)  25 MG tablet, TAKE 1 TABLET BY MOUTH ONCE DAILY AFTER BREAKFAST, Disp: 90 tablet, Rfl: 3   ezetimibe (ZETIA) 10 MG tablet, TAKE 1 TABLET BY MOUTH ONCE DAILY, Disp: 90 tablet, Rfl: 3   levothyroxine (SYNTHROID, LEVOTHROID) 137 MCG tablet, Take 1 tablet (137 mcg total) by mouth daily before breakfast., Disp: 90 tablet, Rfl: 3   metoprolol succinate (TOPROL-XL) 25 MG 24 hr tablet, Take 1 tablet (25 mg total) by mouth 2 (two) times daily. Take with or immediately following a meal., Disp: 60 tablet, Rfl: 3   Multiple Vitamin (MULTIVITAMIN WITH MINERALS) TABS tablet, Take 1 tablet by mouth daily., Disp: , Rfl:    furosemide (LASIX) 40 MG tablet, Take 1 tablet (40 mg total) by mouth as needed for fluid or edema. For weight 144 or greater (Patient not taking: Reported on 04/13/2021), Disp: 30 tablet, Rfl: 5   nitroGLYCERIN (NITROSTAT) 0.4 MG SL tablet, Place 1 tablet (0.4 mg total) under the tongue every 5 (five) minutes as needed for chest pain. (Patient not taking: Reported on 04/13/2021), Disp: 100 tablet, Rfl: 3   potassium chloride (KLOR-CON) 10 MEQ tablet, Take 2 tablets (20 mEq total) by mouth daily as needed (Only take 20 meq if you take lasix). (Patient not taking: Reported on 04/13/2021), Disp: 90 tablet, Rfl: 6  Allergies:  Allergies  Allergen Reactions   Penicillins Anaphylaxis and Swelling    FACIAL SWELLING PATIENT HAS HAD A PCN REACTION WITH IMMEDIATE RASH, FACIAL/TONGUE/THROAT SWELLING, SOB, OR LIGHTHEADEDNESS WITH HYPOTENSION:  #  #  YES  #  #  Has patient had a PCN reaction causing severe rash involving mucus membranes or skin necrosis: No Has patient had a PCN reaction that required hospitalization: No Has patient had a PCN reaction occurring within the last 10 years: No   Propoxyphene N-Acetaminophen Swelling    tongue swelling   Evolocumab Other (See Comments)    Myalgias, fatigue, some itching and burning on her feet,  Arthralgia (Joint Pain)   Levaquin [Levofloxacin] Hives    Statins Other (See Comments)    Joint pain   Cortisone Rash   Myrbetriq [Mirabegron] Rash    Rash on face   Pseudoephedrine Other (See Comments)    Facial flushing    Past Medical History, Surgical history, Social history, and Family History were reviewed and updated.  Review of Systems: Review of Systems  Constitutional: Negative.   HENT: Negative.    Eyes: Negative.   Respiratory: Negative.    Cardiovascular: Negative.   Gastrointestinal: Negative.   Genitourinary: Negative.   Musculoskeletal:  Positive for back pain and joint pain.  Skin: Negative.   Neurological:  Positive for focal weakness.  Endo/Heme/Allergies: Negative.   Psychiatric/Behavioral: Negative.      Physical Exam:  weight is 148 lb (67.1 kg). Her oral temperature is 98.4 F (36.9 C). Her blood pressure is 158/73 (abnormal) and her pulse is 60. Her respiration is 20 and oxygen saturation is 99%.   Physical Exam Vitals reviewed.  HENT:     Head: Normocephalic and atraumatic.  Eyes:     Pupils: Pupils are equal, round, and reactive to light.  Cardiovascular:     Rate and Rhythm: Normal rate and regular rhythm.     Heart sounds: Normal heart sounds.     Comments: Cardiac exam shows a regular rate and rhythm.  She has a 2/6 systolic ejection murmur. Pulmonary:     Effort: Pulmonary effort is normal.     Breath sounds: Normal breath sounds.  Abdominal:     General: Bowel sounds are normal.     Palpations: Abdomen is soft.  Musculoskeletal:        General: No tenderness or deformity. Normal range of motion.     Cervical back: Normal range of motion.  Lymphadenopathy:     Cervical: No cervical adenopathy.  Skin:    General: Skin is warm and dry.     Findings: No erythema or rash.  Neurological:     Mental Status: She is alert and oriented to person, place, and time.  Psychiatric:        Behavior: Behavior normal.        Thought Content: Thought content normal.        Judgment: Judgment normal.     Lab Results  Component Value Date   WBC 5.5 04/13/2021   HGB 11.7 (L) 04/13/2021   HCT 37.0 04/13/2021   MCV 94.6 04/13/2021   PLT 167 04/13/2021     Chemistry      Component Value Date/Time   NA 137 04/13/2021 0752   NA 140 06/09/2017 0758   NA 141 10/11/2016 0844   K 4.2 04/13/2021 0752   K 4.5 06/09/2017 0758   K 4.1 10/11/2016 0844   CL 101 04/13/2021 0752   CL 106 06/09/2017 0758  CO2 29 04/13/2021 0752   CO2 28 06/09/2017 0758   CO2 25 10/11/2016 0844   BUN 35 (H) 04/13/2021 0752   BUN 17 06/09/2017 0758   BUN 20.3 10/11/2016 0844   CREATININE 1.26 (H) 04/13/2021 0752   CREATININE 1.2 06/09/2017 0758   CREATININE 1.1 10/11/2016 0844      Component Value Date/Time   CALCIUM 10.1 04/13/2021 0752   CALCIUM 9.1 06/09/2017 0758   CALCIUM 9.5 10/11/2016 0844   ALKPHOS 59 04/13/2021 0752   ALKPHOS 49 06/09/2017 0758   ALKPHOS 52 10/11/2016 0844   AST 13 (L) 04/13/2021 0752   AST 13 10/11/2016 0844   ALT 10 04/13/2021 0752   ALT 19 06/09/2017 0758   ALT 10 10/11/2016 0844   BILITOT 0.6 04/13/2021 0752   BILITOT 0.83 10/11/2016 0844         Impression and Plan: Ms. Patchen is 86 year-old white female with stage IIA ductal carcinoma of the left breast. She had 4 positive lymph nodes. Her breast cancer was triple positive. She completed her adjuvant chemotherapy. She completed this in July of 2015.  She then went on to complete adjuvant Herceptin in June 2016.  For right now, I do not see any problems with respect to recurrent breast cancer.  I know that she does have a risk of recurrence by the 4+ lymph nodes.  She will stay on the Aromasin.  We will plan for the Prolia when we see her back.  I would like to see her back in December.  If she moves down to [place], I am sure we will be able to find a doctor down there to help take care of her with respect to her breast cancer.   [name], MD 7/11/20228:26 AM

## 2021-04-13 NOTE — Telephone Encounter (Signed)
Appts made and printed for pt per 04/13/21 los   Sabri Teal 

## 2021-04-17 ENCOUNTER — Other Ambulatory Visit: Payer: Self-pay | Admitting: Hematology

## 2021-04-23 DIAGNOSIS — Z96651 Presence of right artificial knee joint: Secondary | ICD-10-CM | POA: Diagnosis not present

## 2021-04-23 DIAGNOSIS — M25562 Pain in left knee: Secondary | ICD-10-CM | POA: Diagnosis not present

## 2021-05-04 ENCOUNTER — Other Ambulatory Visit: Payer: Medicare PPO

## 2021-05-11 ENCOUNTER — Telehealth (HOSPITAL_COMMUNITY): Payer: Self-pay | Admitting: *Deleted

## 2021-05-11 NOTE — Telephone Encounter (Signed)
Pts daughter doesn't think pt needs to go to the ED. Advised she should be evaluated since she is on a blood thinner and had a fall. Daughter said pt would not go but they will accept an office visit this week. Appt scheduled.

## 2021-05-11 NOTE — Telephone Encounter (Signed)
Pts daughter called stating pt blacked out on Friday and fell but she did not hit her head. Daughter said the last time this happened pts heart was out of rhythm but pt and daughter think her heart is in rhythm . Pt stated she was  dizzy right before she blacked out. Pts bp stable per daughter.   Routed to [employer] for advice

## 2021-05-12 NOTE — Progress Notes (Signed)
PCP: Dr. Sarajane Jews Oncologist: Dr. Marin Olp HF Cardiology: Dr. Aundra Dubin  86 y.o. with history of CAD, HCM, paroxysmal atrial fibrillation, and mitral regurgitation presents for followup of HCM, CHF, MR.  Patient had breast cancer in 2015 and received treatment involving Herceptin.  During breast cancer treatment, she developed unstable angina and ended up getting a DES to the mid RCA in 3/15.  Patient additionally has a history of HOCM.  This has been recognized on prior echoes.  She has severe asymmetric basal septal hypertrophy and SAM with LVOT gradient peak 58 mmHg on echo in 3/15 along with moderate MR.  Repeat echo in 7/15 showed LVOT gradient down to 30 mmHg on higher beta blocker.  Repeat echo in 11/15 showed no significant LVOT gradient but SAM still present.  Echo (8/16) showed asymmetric septal hypertrophy, No SAM, no significant LVOT gradient.  Echo 4/18 showed EF 55-60%, small LVOT gradient with severe asymmetric septal hypertrophy, mild MR, PASP 32 mmHg.    She was admitted in 1/20 with symptomatic atrial fibrillation with RVR, this was a new diagnosis.  She was started on Eliquis and cardioverted after TEE back to NSR.  TEE showed severe mitral regurgitation that appeared to be due mostly to posterior leaflet prolapse, there was minimal systolic anterior motion.   In 3/20, she had Mitraclip placement. Echo in 6/20 showed EF 60-65%, severe asymmetric septal hypertrophy, normal RV, s/p Mitraclip with mean gradient 3 mmHg and trivial MR.  Echo in 3/21 showed EF 65%, asymmetric septal hypertrophy with LVOT gradient 37 mmHg, SAM of clipped segment of the mitral valve, normal RV, severe LAE, s/p Mitraclip x 2 with mild MR/no MS.   In October she went to [facility] ED on 07/07/20 for chest pain. She was in A fib at that time. Work up negative so she was discharged later that day.   Had cardioversion 08/07/20 --> NSR  Evaluated in the ED on 01/23/21 for  presyncope on 01/22/21 and syncope 01/23/21. She does not  recall tripping or dizziness. Bruising noted on face/arms. No acute findings. She declined cardiology consultation and preferred to be seen in the clinic. She was supposed to wear Zio patch but this was never done.  Carotid dopplers showed no significant disease in 5/22.   Echo in 5/22 showed EF 60-65% with several focal basal septal hypertrophy, LVOT gradient was not measured, s/p Mitraclip with mean gradient 6 mmHg and mild-moderate MR, RV normal, PASP 38 mmHg.   She developed symptomatic atrial fibrillation again and underwent DCCV in 6/22 on amiodarone.   Today she returns for an "acute" visit here with her daughter She had a syncopal episode last week while out shopping, daughter says she did not hit her head. She felt dizzy preceding event and says it feels the same as when her heart is out of rhythm. She has been having dizzy spells over the past 3-4 months that have a sudden onset, and resolve if she sits and rests. No dyspnea walking on flat ground or with ADLs. Had some chest pain at rest this week that resolved with 2 doses of nitro SL. Denies edema or PND/Orthopnea. Appetite ok, does not drink much fluid during the day. No fever or chills. Weight at home 140 pounds. Taking all medications.   ECG (personally reviewed): SR with 1st degree AVB 62 bpm qrs 485 ms  Labs (3/15): K 4.5, creatinine 0.84, LDL 91, HDL 46 Labs (5/15): K 3.9, creatinine 1.1 Labs (12/15): LDL 80, HDL 28, hemoglobin  10.9 Labs (1/16): K 3.7, creatinine 0.8 Labs (2/16): K 3.6, creatinine 1.2, HCT 34.4 Labs (6/16): K 4.1, creatinine 1.24 Labs (7/17): K 4.1, creatinine 1.2, HCT 38.8, LDL 143 Labs (1/18): LDL 142 Labs (5/18): K 4.4, creatinine 1.0 Labs (1/20): K 3.9, creatinine 1.27, LDL 95 Labs (8/20): K 3.8, creatinine 2.06, LFTs normal Labs (12/20): K 4.1, creatinine 1.14 Labs (2/21): LDL 121 Labs (3/21): K 4.4, creatinine 1.27 Labs (01/23/21): K 3.5 Creatinine 1.2, TSH normal, BNP 385 Labs (5/22): K 4.3,  creatinine 1.26, hgb 11.7, LDL 122 Labs (7/22): K 4.2, creatinine 1.26, hgb 11.7  PMH: 1. CAD: Unstable angina 3/15 with LHC showing 99% mRCA stenosis, treated with DES to mRCA.  2. Hypothyroidism 3. Diverticulosis 4. HTN 5. H/o TAH/BSO 6. Breast cancer: s/p lumpectomy with lymph node biopsy in 2/15.  4/10 nodes positive.  She was started on docetaxol/carboplatin/Herceptin in 3/15 with plan for 6 cycles chemo.  7. Hypertrophic obstructive cardiomyopathy: Echo (3/15) with severe focal basal septal hypertrophy (22 mm), narrow LV outflow tract with mitral valve SAM and 58 mmHg peak LVOT resting gradient, EF 60-65%, moderate MR, moderate LAE, normal RV, lateral s' 10.4 cm/sec.  Patient says that her 2 grown sons has had echoes to screen for HOCM.  Echo (4/15) with EF 65-70%, severe focal basal septal hypertrophy, LVOT gradient 33 mmHg, SAM was present with moderate MR, normal RV size and systolic function, lateral S' 10.6, GLS -17.5%.  Cardiac MRI (5/15) with EF 65%, moderate asymmetric septal hypertrophy, systolic anterior motion of the mitral valve with moderate MR, there was no delayed enhancement.  Echo (7/15) with EF 65-70%, moderate ASH, peak LVOT gradient 30 mmHg, SAM with moderate MR, moderate to severe LAE.  Echo (11/15) with EF 55-60%, GLS -15%, no significant LVOT gradient, systolic anterior motion of the mitral valve with mild MR, normal RV size and systolic function. Echo (4/14) with EF 55-60%, severe asymmetric septal hypertrophy, LVOT gradient 20 mmHg, mild-moderate MR, GLS -18%.  - Echo (8/16): EF 60-65%, asymmetric septal hypertrophy, no significant LV outflow tract gradient, mild MR.   - Echo (4/18): EF 55-60%, small LVOT gradient with severe asymmetric septal hypertrophy, mild MR, PASP 32 mmHg.   - TEE (1/20): EF 55-60%, moderate-severe asymmetric septal hypertrophy, no LVOT gradient, normal RV size and systolic function, severe LAE, severe MR with posterior MV leaflet prolapse and  minimal SAM.   - Echo (6/20): EF 60-65%, severe asymmetric septal hypertrophy, normal RV, s/p Mitraclip with mean gradient 3 mmHg and trivial MR.   - Echo (3/21): EF 65%, asymmetric septal hypertrophy with LVOT gradient 37 mmHg, SAM of clipped segment of the mitral valve, normal RV, severe LAE, s/p Mitraclip x 2 with mild MR/no MS.  - Echo (5/22): EF 60-65% with several focal basal septal hypertrophy, LVOT gradient was not measured, s/p Mitraclip with mean gradient 6 mmHg and mild-moderate MR, RV normal, PASP 38 mmHg.  8. Hyperlipidemia: Myalgias with atorvastatin, myalgias with Repatha.  9. Atrial fibrillation: Paroxysmal, first noted in 1/20.  - DCCV 11/21.  - DCCV 6/22 10. Mitral regurgitation: Appears severe on 1/20 TEE.  Has posterior leaflet prolapse with minimal SAM.  - Mitraclip 3/20.   SH: Married, 2 children, retired, nonsmoker.   FH: No family history of HOCM or sudden death.  There is a family history of CAD.   ROS: All systems reviewed and negative except as per HPI.    Current Outpatient Medications  Medication Sig Dispense Refill   acetaminophen (TYLENOL)  650 MG CR tablet Take 1,300 mg by mouth every 8 (eight) hours as needed for pain.     amiodarone (PACERONE) 200 MG tablet Take 200 mg by mouth daily.     diltiazem (CARDIZEM CD) 120 MG 24 hr capsule TAKE 1 CAPSULE BY MOUTH ONCE DAILY 30 capsule 11   ELIQUIS 5 MG TABS tablet TAKE 1 TABLET BY MOUTH TWICE (2) DAILY 180 tablet 1   exemestane (AROMASIN) 25 MG tablet TAKE 1 TABLET BY MOUTH ONCE DAILY AFTER BREAKFAST 90 tablet 3   ezetimibe (ZETIA) 10 MG tablet TAKE 1 TABLET BY MOUTH ONCE DAILY 90 tablet 3   levothyroxine (SYNTHROID, LEVOTHROID) 137 MCG tablet Take 1 tablet (137 mcg total) by mouth daily before breakfast. 90 tablet 3   metoprolol succinate (TOPROL-XL) 25 MG 24 hr tablet Take 1 tablet (25 mg total) by mouth 2 (two) times daily. Take with or immediately following a meal. 60 tablet 3   Multiple Vitamin (MULTIVITAMIN  WITH MINERALS) TABS tablet Take 1 tablet by mouth daily.     nitroGLYCERIN (NITROSTAT) 0.4 MG SL tablet Place 1 tablet (0.4 mg total) under the tongue every 5 (five) minutes as needed for chest pain. 100 tablet 3   potassium chloride (KLOR-CON) 10 MEQ tablet Take 2 tablets (20 mEq total) by mouth daily as needed (Only take 20 meq if you take lasix). 90 tablet 6   No current facility-administered medications for this encounter.   BP 122/69   Pulse 62   Wt 66.4 kg (146 lb 6.4 oz)   LMP 10/19/1991   SpO2 97%   BMI 22.26 kg/m    Wt Readings from Last 3 Encounters:  05/13/21 66.4 kg (146 lb 6.4 oz)  04/13/21 67.1 kg (148 lb)  04/09/21 67.6 kg (149 lb)   General:  NAD. No resp difficulty HEENT: + HOH, bilateral hearing aids Neck: Supple. No JVD. Carotids 2+ bilat; no bruits. No lymphadenopathy or thryomegaly appreciated. Cor: PMI nondisplaced. Regular rate & rhythm. No rubs, gallops, II/VI SEM RUSB Lungs: Clear Abdomen: Soft, nontender, nondistended. No hepatosplenomegaly. No bruits or masses. Good bowel sounds. Extremities: No cyanosis, clubbing, rash, edema Neuro: Alert & oriented x 3, cranial nerves grossly intact. Moves all 4 extremities w/o difficulty. Affect pleasant.  Assessment/Plan: 1. Syncope: Has tolerated reduced Toprol XL dose. Denies palpitations. Carotid dopplers 5/22 ok. SR today on ECG. - Will place Zio-14 day to quantify any arrhythmia. - ? Orthostatic but does not appear to happen with position changes. Orthostatics negative today. - Check BMET & CBC today. - Encouraged her to stay hydrated, she does not drink much water during the day. - ? Related to her HOCM . Could consider repeating echo to follow LVOT gradient.  2. CAD: Status post PCI for unstable angina in 3/15 with DES to [facility]. No chest pain.   - No ASA, taking Eliquis.    - Unable to tolerate statins or Repatha, she is on Zetia.  3. Hyperlipidemia: Myalgias with Crestor, Lipitor, and pravastatin.  Myalgias  with Repatha.  She has tolerated Zetia 10 mg daily.   4. Hypertrophic obstructive cardiomyopathy: No family history of HOCM or sudden death (interestingly, her husband has HOCM). Cardiac MRI in 5/15 showed no delayed enhancement.  Echo in 3/21 (post-Mitraclip) showed asymmetric septal hypertrophy with LVOT gradient peak 37 mmHg, SAM of the clipped segment of the mitral valve with mild MR, no MS.  Echo 7/22 showing asymmetric septal hypertrophy but LVOT gradient was not measured. - Continue diltiazem  CD 120 mg daily.  - Continue Toprol XL 25 mg bid.   - ? Alcohol septal ablation candidate. - Per patient, her 2 sons have had screening echoes to look for hypertrophic cardiomyopathy and did not show signs of HOCM.    5. Atrial fibrillation: Noted initially in 1/20, suspect this was triggered by mitral regurgitation and HCM.  She was cardioverted to NSR.  She was on amiodarone for a few months, this was stopped after Mitraclip. In A fib 07/07/20. Had successful cardioversion 08/07/20 and in 6/22.  She remains in NSR on amiodarone.  - Continue amiodarone 200 mg daily. LFTs and TSH 7/22 ok, she will need regular eye exam.  - Continue Eliquis.  6. Chronic diastolic CHF:  Echo [id number] with normal EF and normal RV, suspect she still has LVOT gradient though it was not measured on echo.  She is not volume overloaded on exam, feels better in NSR.    - She does not need regular Lasix.  7. Mitral regurgitation: Suspect that mitral regurgitation may have been a trigger for atrial fibrillation.  I think the mechanism of MR was most likely mitral valve prolapse. She has now had Mitraclip placement, echo in 5/22 showed mild-moderate MR, mild MS with mean gradient 6 mmHg.  - Antibiotics with dental work.   Followup with Dr. Aundra Dubin in 6-8 weeks.  Dana FNP 05/13/2021

## 2021-05-13 ENCOUNTER — Other Ambulatory Visit: Payer: Self-pay

## 2021-05-13 ENCOUNTER — Other Ambulatory Visit (HOSPITAL_COMMUNITY): Payer: Self-pay | Admitting: Family Medicine

## 2021-05-13 ENCOUNTER — Ambulatory Visit (HOSPITAL_COMMUNITY)
Admission: RE | Admit: 2021-05-13 | Discharge: 2021-05-13 | Disposition: A | Discharge status: Home / Self Care | Payer: Medicare PPO | Source: Ambulatory Visit | Attending: Family Medicine | Admitting: Family Medicine

## 2021-05-13 ENCOUNTER — Encounter (HOSPITAL_COMMUNITY): Payer: Self-pay

## 2021-05-13 VITALS — BP 122/69 | HR 62 | Wt 146.4 lb

## 2021-05-13 DIAGNOSIS — I340 Nonrheumatic mitral (valve) insufficiency: Secondary | ICD-10-CM | POA: Insufficient documentation

## 2021-05-13 DIAGNOSIS — R55 Syncope and collapse: Secondary | ICD-10-CM

## 2021-05-13 DIAGNOSIS — Z955 Presence of coronary angioplasty implant and graft: Secondary | ICD-10-CM | POA: Insufficient documentation

## 2021-05-13 DIAGNOSIS — Z9889 Other specified postprocedural states: Secondary | ICD-10-CM | POA: Diagnosis not present

## 2021-05-13 DIAGNOSIS — E785 Hyperlipidemia, unspecified: Secondary | ICD-10-CM

## 2021-05-13 DIAGNOSIS — I5032 Chronic diastolic (congestive) heart failure: Secondary | ICD-10-CM

## 2021-05-13 DIAGNOSIS — M791 Myalgia, unspecified site: Secondary | ICD-10-CM | POA: Diagnosis not present

## 2021-05-13 DIAGNOSIS — Z79899 Other long term (current) drug therapy: Secondary | ICD-10-CM | POA: Insufficient documentation

## 2021-05-13 DIAGNOSIS — I421 Obstructive hypertrophic cardiomyopathy: Secondary | ICD-10-CM | POA: Diagnosis not present

## 2021-05-13 DIAGNOSIS — Z7901 Long term (current) use of anticoagulants: Secondary | ICD-10-CM | POA: Diagnosis not present

## 2021-05-13 DIAGNOSIS — I2510 Atherosclerotic heart disease of native coronary artery without angina pectoris: Secondary | ICD-10-CM | POA: Diagnosis not present

## 2021-05-13 DIAGNOSIS — Z8249 Family history of ischemic heart disease and other diseases of the circulatory system: Secondary | ICD-10-CM | POA: Insufficient documentation

## 2021-05-13 DIAGNOSIS — I110 Hypertensive heart disease with heart failure: Secondary | ICD-10-CM | POA: Insufficient documentation

## 2021-05-13 DIAGNOSIS — I480 Paroxysmal atrial fibrillation: Secondary | ICD-10-CM

## 2021-05-13 LAB — BASIC METABOLIC PANEL
Anion gap: 7 (ref 5–15)
BUN: 24 mg/dL — ABNORMAL HIGH (ref 8–23)
CO2: 26 mmol/L (ref 22–32)
Calcium: 9 mg/dL (ref 8.9–10.3)
Chloride: 104 mmol/L (ref 98–111)
Creatinine, Ser: 1.45 mg/dL — ABNORMAL HIGH (ref 0.44–1.00)
GFR, Estimated: 36 mL/min — ABNORMAL LOW (ref >60)
Glucose, Bld: 88 mg/dL (ref 70–99)
Potassium: 4.3 mmol/L (ref 3.5–5.1)
Sodium: 137 mmol/L (ref 135–145)

## 2021-05-13 LAB — CBC
HCT: 36.4 % (ref 36.0–46.0)
Hemoglobin: 11.7 g/dL — ABNORMAL LOW (ref 12.0–15.0)
MCH: 30.6 pg (ref 26.0–34.0)
MCHC: 32.1 g/dL (ref 30.0–36.0)
MCV: 95.3 fL (ref 80.0–100.0)
Platelets: 156 10*3/uL (ref 150–400)
RBC: 3.82 MIL/uL — ABNORMAL LOW (ref 3.87–5.11)
RDW: 14.8 % (ref 11.5–15.5)
WBC: 5.6 10*3/uL (ref 4.0–10.5)
nRBC: 0 % (ref 0.0–0.2)

## 2021-05-13 NOTE — Patient Instructions (Signed)
It was great to see you today! No medication changes are needed at this time.  Labs today We will only contact you if something comes back abnormal or we need to make some changes. Otherwise no news is good news!  Your provider has recommended that  you wear a Zio Patch for 14 days.  This monitor will record your heart rhythm for our review.  IF you have any symptoms while wearing the monitor please press the button.  If you have any issues with the patch or you notice a red or orange light on it please call the company at [phone].  Once you remove the patch please mail it back to the company as soon as possible so we can get the results.   Keep followup as scheduled  Do the following things EVERYDAY: Weigh yourself in the morning before breakfast. Write it down and keep it in a log. Take your medicines as prescribed Eat low salt foods--Limit salt (sodium) to 2000 mg per day.  Stay as active as you can everyday Limit all fluids for the day to less than 2 liters  At the [facility], you and your health needs are our priority. As part of our continuing mission to provide you with exceptional heart care, we have created designated Provider Care Teams. These Care Teams include your primary Cardiologist (physician) and Advanced Practice Providers (APPs- Physician Assistants and Nurse Practitioners) who all work together to provide you with the care you need, when you need it.   You may see any of the following providers on your designated Care Team at your next follow up: Dr Glori Bickers Dr [name] Dr Patrice Paradise, NP Lyda Jester, [place] Ginnie Smart Audry Riles, PharmD   Please be sure to bring in all your medications bottles to every appointment.   If you have any questions or concerns before your next appointment please send [place] a message through [place] or call our office at [phone].    TO LEAVE A MESSAGE FOR THE NURSE  SELECT OPTION 2, PLEASE LEAVE A MESSAGE INCLUDING: YOUR NAME DATE OF BIRTH CALL BACK NUMBER REASON FOR CALL**this is important as we prioritize the call backs  YOU WILL RECEIVE A CALL BACK THE SAME DAY AS LONG AS YOU CALL BEFORE 4:00 PM

## 2021-05-22 ENCOUNTER — Other Ambulatory Visit: Payer: Medicare PPO

## 2021-05-22 ENCOUNTER — Ambulatory Visit: Payer: Medicare PPO | Admitting: Hematology

## 2021-05-22 DIAGNOSIS — R55 Syncope and collapse: Secondary | ICD-10-CM | POA: Diagnosis not present

## 2021-05-28 ENCOUNTER — Telehealth (HOSPITAL_COMMUNITY): Payer: Self-pay | Admitting: Cardiology

## 2021-05-28 NOTE — Telephone Encounter (Signed)
Pt and pts caregiver called to report recent fall/syncopal episode 05/27/21  Caregiver reports patient felt funny, felt funny in the head and went down. Did not loose consciousness. Was able to brace herself for the fall which resulted in cut on hand and bruise. Patient declined ER visit and reports she will not come to [facility]   Above reviewed with Dr Aundra Dubin and recent zio report reviewed Zio-unremarkable Patient negative for orthostatics at last office visit  Advised to possibly use walker during ambulation Above symptoms not felt to be cardiac in nature  Follow up with PCP   [facility] for patinet

## 2021-06-04 NOTE — Telephone Encounter (Signed)
Patients caregiver left message on triage line-reports family had not heard results from monitor Patient c/o CP that radiates into arm. Pain is at the site of the recent monitor.   Attempted to call-no answer [facility]

## 2021-06-11 NOTE — Telephone Encounter (Signed)
Attempting to close loop on message  No answer-LMOM

## 2021-06-15 ENCOUNTER — Encounter (HOSPITAL_COMMUNITY): Payer: Medicare PPO

## 2021-06-22 DIAGNOSIS — N390 Urinary tract infection, site not specified: Secondary | ICD-10-CM | POA: Diagnosis not present

## 2021-07-01 DIAGNOSIS — M810 Age-related osteoporosis without current pathological fracture: Secondary | ICD-10-CM | POA: Diagnosis not present

## 2021-07-01 DIAGNOSIS — E785 Hyperlipidemia, unspecified: Secondary | ICD-10-CM | POA: Diagnosis not present

## 2021-07-01 DIAGNOSIS — I10 Essential (primary) hypertension: Secondary | ICD-10-CM | POA: Diagnosis not present

## 2021-07-01 DIAGNOSIS — E039 Hypothyroidism, unspecified: Secondary | ICD-10-CM | POA: Diagnosis not present

## 2021-07-08 DIAGNOSIS — Z113 Encounter for screening for infections with a predominantly sexual mode of transmission: Secondary | ICD-10-CM | POA: Diagnosis not present

## 2021-07-08 DIAGNOSIS — I25118 Atherosclerotic heart disease of native coronary artery with other forms of angina pectoris: Secondary | ICD-10-CM | POA: Diagnosis not present

## 2021-07-08 DIAGNOSIS — Z8673 Personal history of transient ischemic attack (TIA), and cerebral infarction without residual deficits: Secondary | ICD-10-CM | POA: Diagnosis not present

## 2021-07-08 DIAGNOSIS — Z0000 Encounter for general adult medical examination without abnormal findings: Secondary | ICD-10-CM | POA: Diagnosis not present

## 2021-07-08 DIAGNOSIS — M810 Age-related osteoporosis without current pathological fracture: Secondary | ICD-10-CM | POA: Diagnosis not present

## 2021-07-08 DIAGNOSIS — I4891 Unspecified atrial fibrillation: Secondary | ICD-10-CM | POA: Diagnosis not present

## 2021-07-08 DIAGNOSIS — M25562 Pain in left knee: Secondary | ICD-10-CM | POA: Diagnosis not present

## 2021-07-08 DIAGNOSIS — M549 Dorsalgia, unspecified: Secondary | ICD-10-CM | POA: Diagnosis not present

## 2021-07-08 DIAGNOSIS — E039 Hypothyroidism, unspecified: Secondary | ICD-10-CM | POA: Diagnosis not present

## 2021-07-08 DIAGNOSIS — R351 Nocturia: Secondary | ICD-10-CM | POA: Diagnosis not present

## 2021-07-08 DIAGNOSIS — Z23 Encounter for immunization: Secondary | ICD-10-CM | POA: Diagnosis not present

## 2021-07-08 DIAGNOSIS — D6869 Other thrombophilia: Secondary | ICD-10-CM | POA: Diagnosis not present

## 2021-07-08 DIAGNOSIS — F0390 Unspecified dementia without behavioral disturbance: Secondary | ICD-10-CM | POA: Diagnosis not present

## 2021-07-08 DIAGNOSIS — F322 Major depressive disorder, single episode, severe without psychotic features: Secondary | ICD-10-CM | POA: Diagnosis not present

## 2021-07-08 DIAGNOSIS — I10 Essential (primary) hypertension: Secondary | ICD-10-CM | POA: Diagnosis not present

## 2021-07-08 DIAGNOSIS — D692 Other nonthrombocytopenic purpura: Secondary | ICD-10-CM | POA: Diagnosis not present

## 2021-07-08 DIAGNOSIS — E538 Deficiency of other specified B group vitamins: Secondary | ICD-10-CM | POA: Diagnosis not present

## 2021-07-08 DIAGNOSIS — R2681 Unsteadiness on feet: Secondary | ICD-10-CM | POA: Diagnosis not present

## 2021-07-08 DIAGNOSIS — E785 Hyperlipidemia, unspecified: Secondary | ICD-10-CM | POA: Diagnosis not present

## 2021-07-08 DIAGNOSIS — N1831 Chronic kidney disease, stage 3a: Secondary | ICD-10-CM | POA: Diagnosis not present

## 2021-07-13 ENCOUNTER — Ambulatory Visit (HOSPITAL_COMMUNITY)
Admission: RE | Admit: 2021-07-13 | Discharge: 2021-07-13 | Disposition: A | Discharge status: Home / Self Care | Payer: Medicare PPO | Source: Ambulatory Visit | Attending: Cardiology | Admitting: Cardiology

## 2021-07-13 ENCOUNTER — Other Ambulatory Visit: Payer: Self-pay

## 2021-07-13 VITALS — BP 128/50 | HR 65 | Ht 68.0 in | Wt 143.6 lb

## 2021-07-13 DIAGNOSIS — Z7901 Long term (current) use of anticoagulants: Secondary | ICD-10-CM | POA: Insufficient documentation

## 2021-07-13 DIAGNOSIS — E785 Hyperlipidemia, unspecified: Secondary | ICD-10-CM | POA: Diagnosis not present

## 2021-07-13 DIAGNOSIS — Z9181 History of falling: Secondary | ICD-10-CM | POA: Insufficient documentation

## 2021-07-13 DIAGNOSIS — E039 Hypothyroidism, unspecified: Secondary | ICD-10-CM | POA: Insufficient documentation

## 2021-07-13 DIAGNOSIS — R55 Syncope and collapse: Secondary | ICD-10-CM | POA: Insufficient documentation

## 2021-07-13 DIAGNOSIS — E782 Mixed hyperlipidemia: Secondary | ICD-10-CM | POA: Diagnosis not present

## 2021-07-13 DIAGNOSIS — I5032 Chronic diastolic (congestive) heart failure: Secondary | ICD-10-CM | POA: Insufficient documentation

## 2021-07-13 DIAGNOSIS — M791 Myalgia, unspecified site: Secondary | ICD-10-CM | POA: Diagnosis not present

## 2021-07-13 DIAGNOSIS — I2510 Atherosclerotic heart disease of native coronary artery without angina pectoris: Secondary | ICD-10-CM | POA: Diagnosis not present

## 2021-07-13 DIAGNOSIS — Z8249 Family history of ischemic heart disease and other diseases of the circulatory system: Secondary | ICD-10-CM | POA: Insufficient documentation

## 2021-07-13 DIAGNOSIS — I340 Nonrheumatic mitral (valve) insufficiency: Secondary | ICD-10-CM | POA: Diagnosis not present

## 2021-07-13 DIAGNOSIS — I421 Obstructive hypertrophic cardiomyopathy: Secondary | ICD-10-CM | POA: Insufficient documentation

## 2021-07-13 DIAGNOSIS — Z853 Personal history of malignant neoplasm of breast: Secondary | ICD-10-CM | POA: Insufficient documentation

## 2021-07-13 DIAGNOSIS — Z79890 Hormone replacement therapy (postmenopausal): Secondary | ICD-10-CM | POA: Diagnosis not present

## 2021-07-13 DIAGNOSIS — Z955 Presence of coronary angioplasty implant and graft: Secondary | ICD-10-CM | POA: Insufficient documentation

## 2021-07-13 DIAGNOSIS — I480 Paroxysmal atrial fibrillation: Secondary | ICD-10-CM | POA: Diagnosis not present

## 2021-07-13 DIAGNOSIS — Z79899 Other long term (current) drug therapy: Secondary | ICD-10-CM | POA: Diagnosis not present

## 2021-07-13 DIAGNOSIS — I110 Hypertensive heart disease with heart failure: Secondary | ICD-10-CM | POA: Insufficient documentation

## 2021-07-13 DIAGNOSIS — Z791 Long term (current) use of non-steroidal anti-inflammatories (NSAID): Secondary | ICD-10-CM | POA: Insufficient documentation

## 2021-07-13 LAB — LIPID PANEL
Cholesterol: 195 mg/dL (ref 0–200)
HDL: 48 mg/dL (ref >40)
LDL Cholesterol: 127 mg/dL — ABNORMAL HIGH (ref 0–99)
Total CHOL/HDL Ratio: 4.1 RATIO
Triglycerides: 100 mg/dL (ref <150)
VLDL: 20 mg/dL (ref 0–40)

## 2021-07-13 LAB — COMPREHENSIVE METABOLIC PANEL
ALT: 13 U/L (ref 0–44)
AST: 13 U/L — ABNORMAL LOW (ref 15–41)
Albumin: 3.8 g/dL (ref 3.5–5.0)
Alkaline Phosphatase: 55 U/L (ref 38–126)
Anion gap: 9 (ref 5–15)
BUN: 23 mg/dL (ref 8–23)
CO2: 27 mmol/L (ref 22–32)
Calcium: 9.6 mg/dL (ref 8.9–10.3)
Chloride: 95 mmol/L — ABNORMAL LOW (ref 98–111)
Creatinine, Ser: 1.41 mg/dL — ABNORMAL HIGH (ref 0.44–1.00)
GFR, Estimated: 37 mL/min — ABNORMAL LOW (ref >60)
Glucose, Bld: 90 mg/dL (ref 70–99)
Potassium: 4.5 mmol/L (ref 3.5–5.1)
Sodium: 131 mmol/L — ABNORMAL LOW (ref 135–145)
Total Bilirubin: 0.9 mg/dL (ref 0.3–1.2)
Total Protein: 6.8 g/dL (ref 6.5–8.1)

## 2021-07-13 LAB — CBC
HCT: 38 % (ref 36.0–46.0)
Hemoglobin: 11.8 g/dL — ABNORMAL LOW (ref 12.0–15.0)
MCH: 30.4 pg (ref 26.0–34.0)
MCHC: 31.1 g/dL (ref 30.0–36.0)
MCV: 97.9 fL (ref 80.0–100.0)
Platelets: 196 10*3/uL (ref 150–400)
RBC: 3.88 MIL/uL (ref 3.87–5.11)
RDW: 13.7 % (ref 11.5–15.5)
WBC: 5.9 10*3/uL (ref 4.0–10.5)
nRBC: 0 % (ref 0.0–0.2)

## 2021-07-13 LAB — TSH: TSH: 2.327 u[IU]/mL (ref 0.350–4.500)

## 2021-07-13 MED ORDER — AMIODARONE HCL 200 MG PO TABS: 200.0000 mg | ORAL_TABLET | Freq: Every day | ORAL | 3 refills | Status: DC

## 2021-07-13 NOTE — Progress Notes (Signed)
Patient ID: Kim [name], female   DOB: Apr 27, 1935, 86 y.o.   MRN: [id number] PCP: Dr. Sarajane Jews Oncologist: Dr. Marin Olp Cardiology: Dr. Aundra Dubin  86 y.o. with history of CAD, HCM, paroxysmal atrial fibrillation, and mitral regurgitation presents for followup of HCM, CHF, MR.  Patient had breast cancer in 2015 and received treatment involving Herceptin.  During breast cancer treatment, she developed unstable angina and ended up getting a DES to the mid RCA in 3/15.  Patient additionally has a history of HOCM.  This has been recognized on prior echoes.  She has severe asymmetric basal septal hypertrophy and SAM with LVOT gradient peak 58 mmHg on echo in 3/15 along with moderate MR.  Repeat echo in 7/15 showed LVOT gradient down to 30 mmHg on higher beta blocker.  Repeat echo in 11/15 showed no significant LVOT gradient but SAM still present.  Echo (8/16) showed asymmetric septal hypertrophy, No SAM, no significant LVOT gradient.  Echo 4/18 showed EF 55-60%, small LVOT gradient with severe asymmetric septal hypertrophy, mild MR, PASP 32 mmHg.    She was admitted in 1/20 with symptomatic atrial fibrillation with RVR, this was a new diagnosis.  She was started on Eliquis and cardioverted after TEE back to NSR.  TEE showed severe mitral regurgitation that appeared to be due mostly to posterior leaflet prolapse, there was minimal systolic anterior motion.   In 3/20, she had Mitraclip placement. Echo in 6/20 showed EF 60-65%, severe asymmetric septal hypertrophy, normal RV, s/p Mitraclip with mean gradient 3 mmHg and trivial MR.  Echo in 3/21 showed EF 65%, asymmetric septal hypertrophy with LVOT gradient 37 mmHg, SAM of clipped segment of the mitral valve, normal RV, severe LAE, s/p Mitraclip x 2 with mild MR/no MS.   In October she went to [facility] ED on 07/07/20 for chest pain. She was in A fib at that time. Work up negative so she was discharged later that day.   Had cardioversion 08/07/20 --> NSR  Evaluated in the  ED on 01/23/21 for  presyncope on 01/22/21 and syncope 01/23/21. She does not recall tripping or dizziness. Bruising noted on face/arms. No acute findings. She declined cardiology consultation and preferred to be seen in the clinic. She was supposed to wear Zio patch but this was never done.  Carotid dopplers showed no significant disease in 5/22.   Echo in 5/22 showed EF 60-65% with several focal basal septal hypertrophy, there does not appear to be a significant LVOT gradient, s/p Mitraclip with mean gradient 6 mmHg and mild-moderate MR, RV normal, PASP 38 mmHg.   She developed symptomatic atrial fibrillation again and underwent DCCV in 6/22 on amiodarone. Zio monitor in 8/22 showed NSR, short runs of SVT, no AF.   She returns for followup of CHF, hypertrophic cardiomyopathy, atrial fibrillation.  She is in NSR today.  Weight down 3 lbs.  No significant exertional dyspnea.  No chest pain.  No palpitations.  She has fallen 3 times recently, says that her legs "give out."  No lightheadedness or syncope.  She is now using a walker for stability.   ECG (personally reviewed): NSR, 1st degree AVB, LVH with repolarization abnormality.   Labs (3/15): K 4.5, creatinine 0.84, LDL 91, HDL 46 Labs (5/15): K 3.9, creatinine 1.1 Labs (12/15): LDL 80, HDL 28, hemoglobin 10.9 Labs (1/16): K 3.7, creatinine 0.8 Labs (2/16): K 3.6, creatinine 1.2, HCT 34.4 Labs (6/16): K 4.1, creatinine 1.24 Labs (7/17): K 4.1, creatinine 1.2, HCT 38.8, LDL 143 Labs (  1/18): LDL 142 Labs (5/18): K 4.4, creatinine 1.0 Labs (1/20): K 3.9, creatinine 1.27, LDL 95 Labs (8/20): K 3.8, creatinine 2.06, LFTs normal Labs (12/20): K 4.1, creatinine 1.14 Labs (2/21): LDL 121 Labs (3/21): K 4.4, creatinine 1.27 Labs (01/23/21): K 3.5 Creatinine 1.2, TSH normal, BNP 385 Labs (5/22): K 4.3, creatinine 1.26, hgb 11.7, LDL 122 Labs (7/22): LFTs normal Labs (8/22): K 4.3, creatinine 1.45  PMH: 1. CAD: Unstable angina 3/15 with LHC showing  99% mRCA stenosis, treated with DES to mRCA.  2. Hypothyroidism 3. Diverticulosis 4. HTN 5. H/o TAH/BSO 6. Breast cancer: s/p lumpectomy with lymph node biopsy in 2/15.  4/10 nodes positive.  She was started on docetaxol/carboplatin/Herceptin in 3/15 with plan for 6 cycles chemo.  7. Hypertrophic obstructive cardiomyopathy: Echo (3/15) with severe focal basal septal hypertrophy (22 mm), narrow LV outflow tract with mitral valve SAM and 58 mmHg peak LVOT resting gradient, EF 60-65%, moderate MR, moderate LAE, normal RV, lateral s' 10.4 cm/sec.  Patient says that her 2 grown sons has had echoes to screen for HOCM.  Echo (4/15) with EF 65-70%, severe focal basal septal hypertrophy, LVOT gradient 33 mmHg, SAM was present with moderate MR, normal RV size and systolic function, lateral S' 10.6, GLS -17.5%.  Cardiac MRI (5/15) with EF 65%, moderate asymmetric septal hypertrophy, systolic anterior motion of the mitral valve with moderate MR, there was no delayed enhancement.  Echo (7/15) with EF 65-70%, moderate ASH, peak LVOT gradient 30 mmHg, SAM with moderate MR, moderate to severe LAE.  Echo (11/15) with EF 55-60%, GLS -15%, no significant LVOT gradient, systolic anterior motion of the mitral valve with mild MR, normal RV size and systolic function. Echo (4/14) with EF 55-60%, severe asymmetric septal hypertrophy, LVOT gradient 20 mmHg, mild-moderate MR, GLS -18%.  - Echo (8/16): EF 60-65%, asymmetric septal hypertrophy, no significant LV outflow tract gradient, mild MR.   - Echo (4/18): EF 55-60%, small LVOT gradient with severe asymmetric septal hypertrophy, mild MR, PASP 32 mmHg.   - TEE (1/20): EF 55-60%, moderate-severe asymmetric septal hypertrophy, no LVOT gradient, normal RV size and systolic function, severe LAE, severe MR with posterior MV leaflet prolapse and minimal SAM.   - Echo (6/20): EF 60-65%, severe asymmetric septal hypertrophy, normal RV, s/p Mitraclip with mean gradient 3 mmHg and trivial  MR.   - Echo (3/21): EF 65%, asymmetric septal hypertrophy with LVOT gradient 37 mmHg, SAM of clipped segment of the mitral valve, normal RV, severe LAE, s/p Mitraclip x 2 with mild MR/no MS.  - Echo (5/22): EF 60-65% with several focal basal septal hypertrophy, There was no significant LVOT gradient, s/p Mitraclip with mean gradient 6 mmHg and mild-moderate MR, RV normal, PASP 38 mmHg.  8. Hyperlipidemia: Myalgias with atorvastatin, myalgias with Repatha.  9. Atrial fibrillation: Paroxysmal, first noted in 1/20.  - DCCV 11/21.  - DCCV 6/22 - Zio monitor (8/22): NSR, rare short SVT, no AF.  10. Mitral regurgitation: Appears severe on 1/20 TEE.  Has posterior leaflet prolapse with minimal SAM.  - Mitraclip 3/20.   SH: Married, 2 children, retired, nonsmoker.   FH: No family history of HOCM or sudden death.  There is a family history of CAD.   ROS: All systems reviewed and negative except as per HPI.    Current Outpatient Medications  Medication Sig Dispense Refill   acetaminophen (TYLENOL) 650 MG CR tablet Take 1,950 mg by mouth every 3 (three) hours.     diltiazem (  CARDIZEM CD) 120 MG 24 hr capsule TAKE 1 CAPSULE BY MOUTH ONCE DAILY 30 capsule 11   DULoxetine (CYMBALTA) 30 MG capsule Take 30 mg by mouth in the morning.     ELIQUIS 5 MG TABS tablet TAKE 1 TABLET BY MOUTH TWICE (2) DAILY 180 tablet 1   exemestane (AROMASIN) 25 MG tablet TAKE 1 TABLET BY MOUTH ONCE DAILY AFTER BREAKFAST 90 tablet 3   ezetimibe (ZETIA) 10 MG tablet TAKE 1 TABLET BY MOUTH ONCE DAILY 90 tablet 3   levothyroxine (SYNTHROID, LEVOTHROID) 137 MCG tablet Take 1 tablet (137 mcg total) by mouth daily before breakfast. 90 tablet 3   metoprolol succinate (TOPROL-XL) 25 MG 24 hr tablet Take 1 tablet (25 mg total) by mouth 2 (two) times daily. Take with or immediately following a meal. 60 tablet 3   Multiple Vitamin (MULTIVITAMIN WITH MINERALS) TABS tablet Take 1 tablet by mouth daily.     nitroGLYCERIN (NITROSTAT) 0.4 MG  SL tablet Place 1 tablet (0.4 mg total) under the tongue every 5 (five) minutes as needed for chest pain. 100 tablet 3   amiodarone (PACERONE) 200 MG tablet Take 1 tablet (200 mg total) by mouth daily. 90 tablet 3   potassium chloride (KLOR-CON) 10 MEQ tablet Take 2 tablets (20 mEq total) by mouth daily as needed (Only take 20 meq if you take lasix). (Patient not taking: Reported on 07/13/2021) 90 tablet 6   No current facility-administered medications for this encounter.   BP (!) 128/50   Pulse 65   Ht 5\' 8"  (1.727 m)   Wt 65.1 kg (143 lb 9.6 oz)   LMP 10/19/1991   SpO2 98%   BMI 21.83 kg/m   Vitals:   07/13/21 0945  BP: (!) 128/50  Pulse: 65  SpO2: 98%   Wt Readings from Last 3 Encounters:  07/13/21 65.1 kg (143 lb 9.6 oz)  05/13/21 66.4 kg (146 lb 6.4 oz)  04/13/21 67.1 kg (148 lb)   General: NAD Neck: No JVD, no thyromegaly or thyroid nodule.  Lungs: Clear to auscultation bilaterally with normal respiratory effort. CV: Nondisplaced PMI.  Heart regular S1/S2, no S3/S4, 1/6 early SEM RUSB.  No peripheral edema.  No carotid bruit.  Normal pedal pulses.  Abdomen: Soft, nontender, no hepatosplenomegaly, no distention.  Skin: Intact without lesions or rashes.  Neurologic: Alert and oriented x 3.  Psych: Normal affect. Extremities: No clubbing or cyanosis.  HEENT: Normal.   Assessment/Plan: 1. CAD: Status post PCI for unstable angina in 3/15 with DES to [facility]. No chest pain.   - No ASA, taking Eliquis.    - Unable to tolerate statins or Repatha, she is on Zetia. Check lipids today.  2. Hyperlipidemia: Myalgias with Crestor, Lipitor, and pravastatin.  Myalgias with Repatha.  She has tolerated Zetia 10 mg daily.  Check lipids today.  3. Hypertrophic obstructive cardiomyopathy: No family history of HOCM or sudden death (interestingly, her husband has HOCM). Cardiac MRI in 5/15 showed no delayed enhancement.  Echo in 3/21 (post-Mitraclip) showed asymmetric septal hypertrophy with  LVOT gradient peak 37 mmHg, SAM of the clipped segment of the mitral valve with mild MR, no MS.  Echo in 5/22 showed asymmetric septal hypertrophy but there was no significant LVOT gradient.  - Continue diltiazem CD 120 mg daily.  - Continue Toprol XL 25 mg bid.   - Not candidate for mavacamten with no significant LVOT gradient detected.  - Patient's daughter still needs echo screening for HCM, her son  has been screened.     4. Atrial fibrillation: Noted initially in 1/20, suspect this was triggered by mitral regurgitation and HCM.  She was cardioverted to NSR.  She was on amiodarone for a few months, this was stopped after Mitraclip. In A fib 07/07/20. Had successful cardioversion 08/07/20 and in 6/22.  She remains in NSR on amiodarone but is still taking amiodarone 200 mg bid.  - Decrease amiodarone to 200 mg daily.  Check LFTs and TSH today, she will need regular eye exam.  - Continue Eliquis. CBC today.  5. Chronic diastolic CHF:  Echo in 1/29 with normal EF and normal RV, suspect she still has LVOT gradient though it was not measured on echo today.  She is not volume overloaded on exam, feels better in NSR.    - She does not need regular Lasix.  6. Mitral regurgitation: Suspect that mitral regurgitation may have been a trigger for atrial fibrillation.  I think the mechanism of MR was most likely mitral valve prolapse. She has now had Mitraclip placement, echo in 5/22 showed mild-moderate MR, mild MS with mean gradient 6 mmHg.  - Antibiotics with dental work.  7. Syncope: No recurrence since decreasing Toprol XL dose.  ?Orthostatic.    Followup in 4 months.   [name] 07/13/2021

## 2021-07-13 NOTE — Patient Instructions (Addendum)
EKG done today.  Labs done today. We will contact you only if your labs are abnormal.  DECREASE Amiodarone to 200mg  (1 tablet) by mouth daily.   No other medication changes were made. Please continue all current medications as prescribed.  Your physician recommends that you schedule a follow-up appointment in: 4 months  If you have any questions or concerns before your next appointment please send [place] a message through [place] or call our office at [phone].    TO LEAVE A MESSAGE FOR THE NURSE SELECT OPTION 2, PLEASE LEAVE A MESSAGE INCLUDING: YOUR NAME DATE OF BIRTH CALL BACK NUMBER REASON FOR CALL**this is important as we prioritize the call backs  YOU WILL RECEIVE A CALL BACK THE SAME DAY AS LONG AS YOU CALL BEFORE 4:00 PM   Do the following things EVERYDAY: Weigh yourself in the morning before breakfast. Write it down and keep it in a log. Take your medicines as prescribed Eat low salt foods--Limit salt (sodium) to 2000 mg per day.  Stay as active as you can everyday Limit all fluids for the day to less than 2 liters   At the [facility], you and your health needs are our priority. As part of our continuing mission to provide you with exceptional heart care, we have created designated Provider Care Teams. These Care Teams include your primary Cardiologist (physician) and Advanced Practice Providers (APPs- Physician Assistants and Nurse Practitioners) who all work together to provide you with the care you need, when you need it.   You may see any of the following providers on your designated Care Team at your next follow up: Dr Glori Bickers Dr Haynes Kerns, NP Lyda Jester, [place] Audry Riles, PharmD   Please be sure to bring in all your medications bottles to every appointment.

## 2021-07-17 ENCOUNTER — Telehealth: Payer: Self-pay | Admitting: *Deleted

## 2021-07-17 NOTE — Telephone Encounter (Signed)
Message received from patient's daughter, [name] to inform Dr. Marin Olp that pt.'s Vit B12 level is low and that pt.'s PCP would like to give pt Vit B 12 injections if that is OK with Dr. Marin Olp.  Call placed back to [name] and message left to inform her that Vit B12 injections are fine to receive through pt.'s PCP per Dr. Marin Olp.  Instructed [name] to call office back with any further questions.

## 2021-07-21 DIAGNOSIS — E538 Deficiency of other specified B group vitamins: Secondary | ICD-10-CM | POA: Diagnosis not present

## 2021-07-22 DIAGNOSIS — M1712 Unilateral primary osteoarthritis, left knee: Secondary | ICD-10-CM | POA: Diagnosis not present

## 2021-07-22 DIAGNOSIS — M810 Age-related osteoporosis without current pathological fracture: Secondary | ICD-10-CM | POA: Diagnosis not present

## 2021-07-22 DIAGNOSIS — D692 Other nonthrombocytopenic purpura: Secondary | ICD-10-CM | POA: Diagnosis not present

## 2021-07-22 DIAGNOSIS — C50919 Malignant neoplasm of unspecified site of unspecified female breast: Secondary | ICD-10-CM | POA: Diagnosis not present

## 2021-07-22 DIAGNOSIS — E785 Hyperlipidemia, unspecified: Secondary | ICD-10-CM | POA: Diagnosis not present

## 2021-07-22 DIAGNOSIS — D6869 Other thrombophilia: Secondary | ICD-10-CM | POA: Diagnosis not present

## 2021-07-22 DIAGNOSIS — M48061 Spinal stenosis, lumbar region without neurogenic claudication: Secondary | ICD-10-CM | POA: Diagnosis not present

## 2021-07-22 DIAGNOSIS — F0280 Dementia in other diseases classified elsewhere without behavioral disturbance: Secondary | ICD-10-CM | POA: Diagnosis not present

## 2021-07-22 DIAGNOSIS — E538 Deficiency of other specified B group vitamins: Secondary | ICD-10-CM | POA: Diagnosis not present

## 2021-07-22 DIAGNOSIS — E039 Hypothyroidism, unspecified: Secondary | ICD-10-CM | POA: Diagnosis not present

## 2021-07-23 DIAGNOSIS — E538 Deficiency of other specified B group vitamins: Secondary | ICD-10-CM | POA: Diagnosis not present

## 2021-07-28 DIAGNOSIS — E538 Deficiency of other specified B group vitamins: Secondary | ICD-10-CM | POA: Diagnosis not present

## 2021-07-28 DIAGNOSIS — E785 Hyperlipidemia, unspecified: Secondary | ICD-10-CM | POA: Diagnosis not present

## 2021-07-28 DIAGNOSIS — M1712 Unilateral primary osteoarthritis, left knee: Secondary | ICD-10-CM | POA: Diagnosis not present

## 2021-07-28 DIAGNOSIS — E039 Hypothyroidism, unspecified: Secondary | ICD-10-CM | POA: Diagnosis not present

## 2021-07-28 DIAGNOSIS — M810 Age-related osteoporosis without current pathological fracture: Secondary | ICD-10-CM | POA: Diagnosis not present

## 2021-07-28 DIAGNOSIS — M48061 Spinal stenosis, lumbar region without neurogenic claudication: Secondary | ICD-10-CM | POA: Diagnosis not present

## 2021-07-28 DIAGNOSIS — C50919 Malignant neoplasm of unspecified site of unspecified female breast: Secondary | ICD-10-CM | POA: Diagnosis not present

## 2021-07-28 DIAGNOSIS — D6869 Other thrombophilia: Secondary | ICD-10-CM | POA: Diagnosis not present

## 2021-07-28 DIAGNOSIS — F0280 Dementia in other diseases classified elsewhere without behavioral disturbance: Secondary | ICD-10-CM | POA: Diagnosis not present

## 2021-07-28 DIAGNOSIS — D692 Other nonthrombocytopenic purpura: Secondary | ICD-10-CM | POA: Diagnosis not present

## 2021-07-29 DIAGNOSIS — E538 Deficiency of other specified B group vitamins: Secondary | ICD-10-CM | POA: Diagnosis not present

## 2021-07-30 DIAGNOSIS — E538 Deficiency of other specified B group vitamins: Secondary | ICD-10-CM | POA: Diagnosis not present

## 2021-07-30 DIAGNOSIS — R2681 Unsteadiness on feet: Secondary | ICD-10-CM | POA: Diagnosis not present

## 2021-07-30 DIAGNOSIS — G320 Subacute combined degeneration of spinal cord in diseases classified elsewhere: Secondary | ICD-10-CM | POA: Diagnosis not present

## 2021-08-01 DIAGNOSIS — C50919 Malignant neoplasm of unspecified site of unspecified female breast: Secondary | ICD-10-CM | POA: Diagnosis not present

## 2021-08-01 DIAGNOSIS — M48061 Spinal stenosis, lumbar region without neurogenic claudication: Secondary | ICD-10-CM | POA: Diagnosis not present

## 2021-08-01 DIAGNOSIS — F0280 Dementia in other diseases classified elsewhere without behavioral disturbance: Secondary | ICD-10-CM | POA: Diagnosis not present

## 2021-08-01 DIAGNOSIS — D6869 Other thrombophilia: Secondary | ICD-10-CM | POA: Diagnosis not present

## 2021-08-01 DIAGNOSIS — E785 Hyperlipidemia, unspecified: Secondary | ICD-10-CM | POA: Diagnosis not present

## 2021-08-01 DIAGNOSIS — M1712 Unilateral primary osteoarthritis, left knee: Secondary | ICD-10-CM | POA: Diagnosis not present

## 2021-08-01 DIAGNOSIS — D692 Other nonthrombocytopenic purpura: Secondary | ICD-10-CM | POA: Diagnosis not present

## 2021-08-01 DIAGNOSIS — E039 Hypothyroidism, unspecified: Secondary | ICD-10-CM | POA: Diagnosis not present

## 2021-08-01 DIAGNOSIS — M810 Age-related osteoporosis without current pathological fracture: Secondary | ICD-10-CM | POA: Diagnosis not present

## 2021-08-04 DIAGNOSIS — E039 Hypothyroidism, unspecified: Secondary | ICD-10-CM | POA: Diagnosis not present

## 2021-08-04 DIAGNOSIS — M48061 Spinal stenosis, lumbar region without neurogenic claudication: Secondary | ICD-10-CM | POA: Diagnosis not present

## 2021-08-04 DIAGNOSIS — M810 Age-related osteoporosis without current pathological fracture: Secondary | ICD-10-CM | POA: Diagnosis not present

## 2021-08-04 DIAGNOSIS — D6869 Other thrombophilia: Secondary | ICD-10-CM | POA: Diagnosis not present

## 2021-08-04 DIAGNOSIS — C50919 Malignant neoplasm of unspecified site of unspecified female breast: Secondary | ICD-10-CM | POA: Diagnosis not present

## 2021-08-04 DIAGNOSIS — E785 Hyperlipidemia, unspecified: Secondary | ICD-10-CM | POA: Diagnosis not present

## 2021-08-04 DIAGNOSIS — M1712 Unilateral primary osteoarthritis, left knee: Secondary | ICD-10-CM | POA: Diagnosis not present

## 2021-08-04 DIAGNOSIS — F0280 Dementia in other diseases classified elsewhere without behavioral disturbance: Secondary | ICD-10-CM | POA: Diagnosis not present

## 2021-08-04 DIAGNOSIS — D692 Other nonthrombocytopenic purpura: Secondary | ICD-10-CM | POA: Diagnosis not present

## 2021-08-05 DIAGNOSIS — E538 Deficiency of other specified B group vitamins: Secondary | ICD-10-CM | POA: Diagnosis not present

## 2021-08-10 DIAGNOSIS — M1712 Unilateral primary osteoarthritis, left knee: Secondary | ICD-10-CM | POA: Diagnosis not present

## 2021-08-10 DIAGNOSIS — F0280 Dementia in other diseases classified elsewhere without behavioral disturbance: Secondary | ICD-10-CM | POA: Diagnosis not present

## 2021-08-10 DIAGNOSIS — D6869 Other thrombophilia: Secondary | ICD-10-CM | POA: Diagnosis not present

## 2021-08-10 DIAGNOSIS — D692 Other nonthrombocytopenic purpura: Secondary | ICD-10-CM | POA: Diagnosis not present

## 2021-08-10 DIAGNOSIS — E785 Hyperlipidemia, unspecified: Secondary | ICD-10-CM | POA: Diagnosis not present

## 2021-08-10 DIAGNOSIS — E039 Hypothyroidism, unspecified: Secondary | ICD-10-CM | POA: Diagnosis not present

## 2021-08-10 DIAGNOSIS — C50919 Malignant neoplasm of unspecified site of unspecified female breast: Secondary | ICD-10-CM | POA: Diagnosis not present

## 2021-08-10 DIAGNOSIS — M48061 Spinal stenosis, lumbar region without neurogenic claudication: Secondary | ICD-10-CM | POA: Diagnosis not present

## 2021-08-10 DIAGNOSIS — M810 Age-related osteoporosis without current pathological fracture: Secondary | ICD-10-CM | POA: Diagnosis not present

## 2021-08-12 DIAGNOSIS — E538 Deficiency of other specified B group vitamins: Secondary | ICD-10-CM | POA: Diagnosis not present

## 2021-08-14 DIAGNOSIS — M25562 Pain in left knee: Secondary | ICD-10-CM | POA: Diagnosis not present

## 2021-08-14 DIAGNOSIS — Z1331 Encounter for screening for depression: Secondary | ICD-10-CM | POA: Diagnosis not present

## 2021-08-14 DIAGNOSIS — N1831 Chronic kidney disease, stage 3a: Secondary | ICD-10-CM | POA: Diagnosis not present

## 2021-08-14 DIAGNOSIS — D692 Other nonthrombocytopenic purpura: Secondary | ICD-10-CM | POA: Diagnosis not present

## 2021-08-14 DIAGNOSIS — I4891 Unspecified atrial fibrillation: Secondary | ICD-10-CM | POA: Diagnosis not present

## 2021-08-14 DIAGNOSIS — N390 Urinary tract infection, site not specified: Secondary | ICD-10-CM | POA: Diagnosis not present

## 2021-08-14 DIAGNOSIS — F0390 Unspecified dementia without behavioral disturbance: Secondary | ICD-10-CM | POA: Diagnosis not present

## 2021-08-14 DIAGNOSIS — I10 Essential (primary) hypertension: Secondary | ICD-10-CM | POA: Diagnosis not present

## 2021-08-14 DIAGNOSIS — M549 Dorsalgia, unspecified: Secondary | ICD-10-CM | POA: Diagnosis not present

## 2021-08-14 DIAGNOSIS — E538 Deficiency of other specified B group vitamins: Secondary | ICD-10-CM | POA: Diagnosis not present

## 2021-08-14 DIAGNOSIS — R350 Frequency of micturition: Secondary | ICD-10-CM | POA: Diagnosis not present

## 2021-08-14 DIAGNOSIS — E039 Hypothyroidism, unspecified: Secondary | ICD-10-CM | POA: Diagnosis not present

## 2021-08-14 DIAGNOSIS — E785 Hyperlipidemia, unspecified: Secondary | ICD-10-CM | POA: Diagnosis not present

## 2021-08-18 DIAGNOSIS — E039 Hypothyroidism, unspecified: Secondary | ICD-10-CM | POA: Diagnosis not present

## 2021-08-18 DIAGNOSIS — M810 Age-related osteoporosis without current pathological fracture: Secondary | ICD-10-CM | POA: Diagnosis not present

## 2021-08-18 DIAGNOSIS — F0280 Dementia in other diseases classified elsewhere without behavioral disturbance: Secondary | ICD-10-CM | POA: Diagnosis not present

## 2021-08-18 DIAGNOSIS — E785 Hyperlipidemia, unspecified: Secondary | ICD-10-CM | POA: Diagnosis not present

## 2021-08-18 DIAGNOSIS — C50919 Malignant neoplasm of unspecified site of unspecified female breast: Secondary | ICD-10-CM | POA: Diagnosis not present

## 2021-08-18 DIAGNOSIS — D692 Other nonthrombocytopenic purpura: Secondary | ICD-10-CM | POA: Diagnosis not present

## 2021-08-18 DIAGNOSIS — M1712 Unilateral primary osteoarthritis, left knee: Secondary | ICD-10-CM | POA: Diagnosis not present

## 2021-08-18 DIAGNOSIS — D6869 Other thrombophilia: Secondary | ICD-10-CM | POA: Diagnosis not present

## 2021-08-18 DIAGNOSIS — M48061 Spinal stenosis, lumbar region without neurogenic claudication: Secondary | ICD-10-CM | POA: Diagnosis not present

## 2021-08-22 ENCOUNTER — Emergency Department (HOSPITAL_COMMUNITY): Payer: Medicare PPO

## 2021-08-22 ENCOUNTER — Encounter (HOSPITAL_COMMUNITY): Payer: Self-pay

## 2021-08-22 ENCOUNTER — Other Ambulatory Visit: Payer: Self-pay

## 2021-08-22 ENCOUNTER — Inpatient Hospital Stay (HOSPITAL_COMMUNITY)
Admission: EM | Admit: 2021-08-22 | Discharge: 2021-08-28 | DRG: 481 | Disposition: A | Discharge status: Skilled Nursing Facility | Payer: Medicare PPO | Attending: Internal Medicine | Admitting: Internal Medicine

## 2021-08-22 DIAGNOSIS — Z811 Family history of alcohol abuse and dependence: Secondary | ICD-10-CM

## 2021-08-22 DIAGNOSIS — D509 Iron deficiency anemia, unspecified: Secondary | ICD-10-CM | POA: Diagnosis not present

## 2021-08-22 DIAGNOSIS — Z880 Allergy status to penicillin: Secondary | ICD-10-CM

## 2021-08-22 DIAGNOSIS — I951 Orthostatic hypotension: Secondary | ICD-10-CM | POA: Diagnosis not present

## 2021-08-22 DIAGNOSIS — N1832 Chronic kidney disease, stage 3b: Secondary | ICD-10-CM | POA: Diagnosis not present

## 2021-08-22 DIAGNOSIS — Z86010 Personal history of colonic polyps: Secondary | ICD-10-CM

## 2021-08-22 DIAGNOSIS — Z853 Personal history of malignant neoplasm of breast: Secondary | ICD-10-CM

## 2021-08-22 DIAGNOSIS — Z974 Presence of external hearing-aid: Secondary | ICD-10-CM

## 2021-08-22 DIAGNOSIS — D500 Iron deficiency anemia secondary to blood loss (chronic): Secondary | ICD-10-CM | POA: Diagnosis not present

## 2021-08-22 DIAGNOSIS — Z841 Family history of disorders of kidney and ureter: Secondary | ICD-10-CM

## 2021-08-22 DIAGNOSIS — Z90710 Acquired absence of both cervix and uterus: Secondary | ICD-10-CM

## 2021-08-22 DIAGNOSIS — I739 Peripheral vascular disease, unspecified: Secondary | ICD-10-CM | POA: Diagnosis not present

## 2021-08-22 DIAGNOSIS — Z79811 Long term (current) use of aromatase inhibitors: Secondary | ICD-10-CM | POA: Diagnosis not present

## 2021-08-22 DIAGNOSIS — E039 Hypothyroidism, unspecified: Secondary | ICD-10-CM | POA: Diagnosis not present

## 2021-08-22 DIAGNOSIS — Z9221 Personal history of antineoplastic chemotherapy: Secondary | ICD-10-CM

## 2021-08-22 DIAGNOSIS — Z419 Encounter for procedure for purposes other than remedying health state, unspecified: Secondary | ICD-10-CM

## 2021-08-22 DIAGNOSIS — R102 Pelvic and perineal pain: Secondary | ICD-10-CM | POA: Diagnosis not present

## 2021-08-22 DIAGNOSIS — W010XXA Fall on same level from slipping, tripping and stumbling without subsequent striking against object, initial encounter: Secondary | ICD-10-CM | POA: Diagnosis present

## 2021-08-22 DIAGNOSIS — I2510 Atherosclerotic heart disease of native coronary artery without angina pectoris: Secondary | ICD-10-CM | POA: Diagnosis present

## 2021-08-22 DIAGNOSIS — D62 Acute posthemorrhagic anemia: Secondary | ICD-10-CM

## 2021-08-22 DIAGNOSIS — G319 Degenerative disease of nervous system, unspecified: Secondary | ICD-10-CM | POA: Diagnosis not present

## 2021-08-22 DIAGNOSIS — S0990XA Unspecified injury of head, initial encounter: Secondary | ICD-10-CM | POA: Diagnosis not present

## 2021-08-22 DIAGNOSIS — Z7401 Bed confinement status: Secondary | ICD-10-CM | POA: Diagnosis not present

## 2021-08-22 DIAGNOSIS — I252 Old myocardial infarction: Secondary | ICD-10-CM | POA: Diagnosis not present

## 2021-08-22 DIAGNOSIS — S72041A Displaced fracture of base of neck of right femur, initial encounter for closed fracture: Secondary | ICD-10-CM | POA: Diagnosis not present

## 2021-08-22 DIAGNOSIS — Z9841 Cataract extraction status, right eye: Secondary | ICD-10-CM | POA: Diagnosis not present

## 2021-08-22 DIAGNOSIS — H9190 Unspecified hearing loss, unspecified ear: Secondary | ICD-10-CM | POA: Diagnosis not present

## 2021-08-22 DIAGNOSIS — W19XXXA Unspecified fall, initial encounter: Secondary | ICD-10-CM

## 2021-08-22 DIAGNOSIS — S72001D Fracture of unspecified part of neck of right femur, subsequent encounter for closed fracture with routine healing: Secondary | ICD-10-CM | POA: Diagnosis not present

## 2021-08-22 DIAGNOSIS — Z4889 Encounter for other specified surgical aftercare: Secondary | ICD-10-CM | POA: Diagnosis not present

## 2021-08-22 DIAGNOSIS — Z7901 Long term (current) use of anticoagulants: Secondary | ICD-10-CM

## 2021-08-22 DIAGNOSIS — M79604 Pain in right leg: Secondary | ICD-10-CM | POA: Diagnosis not present

## 2021-08-22 DIAGNOSIS — S72009A Fracture of unspecified part of neck of unspecified femur, initial encounter for closed fracture: Secondary | ICD-10-CM | POA: Diagnosis present

## 2021-08-22 DIAGNOSIS — S72121A Displaced fracture of lesser trochanter of right femur, initial encounter for closed fracture: Secondary | ICD-10-CM | POA: Diagnosis not present

## 2021-08-22 DIAGNOSIS — I421 Obstructive hypertrophic cardiomyopathy: Secondary | ICD-10-CM | POA: Diagnosis not present

## 2021-08-22 DIAGNOSIS — Z96651 Presence of right artificial knee joint: Secondary | ICD-10-CM | POA: Diagnosis present

## 2021-08-22 DIAGNOSIS — Z888 Allergy status to other drugs, medicaments and biological substances status: Secondary | ICD-10-CM

## 2021-08-22 DIAGNOSIS — R531 Weakness: Secondary | ICD-10-CM | POA: Diagnosis not present

## 2021-08-22 DIAGNOSIS — S72141A Displaced intertrochanteric fracture of right femur, initial encounter for closed fracture: Secondary | ICD-10-CM | POA: Diagnosis not present

## 2021-08-22 DIAGNOSIS — I480 Paroxysmal atrial fibrillation: Secondary | ICD-10-CM | POA: Diagnosis not present

## 2021-08-22 DIAGNOSIS — E785 Hyperlipidemia, unspecified: Secondary | ICD-10-CM | POA: Diagnosis not present

## 2021-08-22 DIAGNOSIS — Z961 Presence of intraocular lens: Secondary | ICD-10-CM | POA: Diagnosis present

## 2021-08-22 DIAGNOSIS — Z923 Personal history of irradiation: Secondary | ICD-10-CM

## 2021-08-22 DIAGNOSIS — Z955 Presence of coronary angioplasty implant and graft: Secondary | ICD-10-CM

## 2021-08-22 DIAGNOSIS — I10 Essential (primary) hypertension: Secondary | ICD-10-CM | POA: Diagnosis not present

## 2021-08-22 DIAGNOSIS — S72001A Fracture of unspecified part of neck of right femur, initial encounter for closed fracture: Secondary | ICD-10-CM | POA: Diagnosis not present

## 2021-08-22 DIAGNOSIS — Z4789 Encounter for other orthopedic aftercare: Secondary | ICD-10-CM | POA: Diagnosis not present

## 2021-08-22 DIAGNOSIS — Z79899 Other long term (current) drug therapy: Secondary | ICD-10-CM

## 2021-08-22 DIAGNOSIS — Z9842 Cataract extraction status, left eye: Secondary | ICD-10-CM

## 2021-08-22 DIAGNOSIS — Z20822 Contact with and (suspected) exposure to covid-19: Secondary | ICD-10-CM | POA: Diagnosis present

## 2021-08-22 DIAGNOSIS — M25551 Pain in right hip: Secondary | ICD-10-CM | POA: Diagnosis not present

## 2021-08-22 DIAGNOSIS — Z79890 Hormone replacement therapy (postmenopausal): Secondary | ICD-10-CM

## 2021-08-22 LAB — CBC
HCT: 34.6 % — ABNORMAL LOW (ref 36.0–46.0)
Hemoglobin: 11 g/dL — ABNORMAL LOW (ref 12.0–15.0)
MCH: 30.9 pg (ref 26.0–34.0)
MCHC: 31.8 g/dL (ref 30.0–36.0)
MCV: 97.2 fL (ref 80.0–100.0)
Platelets: 171 10*3/uL (ref 150–400)
RBC: 3.56 MIL/uL — ABNORMAL LOW (ref 3.87–5.11)
RDW: 13.8 % (ref 11.5–15.5)
WBC: 6.9 10*3/uL (ref 4.0–10.5)
nRBC: 0 % (ref 0.0–0.2)

## 2021-08-22 LAB — BASIC METABOLIC PANEL
Anion gap: 10 (ref 5–15)
BUN: 32 mg/dL — ABNORMAL HIGH (ref 8–23)
CO2: 23 mmol/L (ref 22–32)
Calcium: 8.6 mg/dL — ABNORMAL LOW (ref 8.9–10.3)
Chloride: 103 mmol/L (ref 98–111)
Creatinine, Ser: 1.48 mg/dL — ABNORMAL HIGH (ref 0.44–1.00)
GFR, Estimated: 35 mL/min — ABNORMAL LOW (ref >60)
Glucose, Bld: 129 mg/dL — ABNORMAL HIGH (ref 70–99)
Potassium: 4.1 mmol/L (ref 3.5–5.1)
Sodium: 136 mmol/L (ref 135–145)

## 2021-08-22 LAB — RESP PANEL BY RT-PCR (FLU A&B, COVID) ARPGX2
Influenza A by PCR: NEGATIVE
Influenza B by PCR: NEGATIVE
SARS Coronavirus 2 by RT PCR: NEGATIVE

## 2021-08-22 MED ORDER — EXEMESTANE 25 MG PO TABS
25.0000 mg | ORAL_TABLET | Freq: Every day | ORAL | Status: DC
  Administered 2021-08-23 – 2021-08-28 (×6): 25 mg via ORAL
  Filled 2021-08-22 (×6): qty 1

## 2021-08-22 MED ORDER — METOPROLOL SUCCINATE ER 25 MG PO TB24: 25.0000 mg | ORAL_TABLET | Freq: Two times a day (BID) | ORAL | Status: DC

## 2021-08-22 MED ORDER — METOPROLOL SUCCINATE ER 25 MG PO TB24
25.0000 mg | ORAL_TABLET | Freq: Two times a day (BID) | ORAL | Status: DC
  Administered 2021-08-23 – 2021-08-28 (×8): 25 mg via ORAL
  Filled 2021-08-22 (×9): qty 1

## 2021-08-22 MED ORDER — FENTANYL CITRATE PF 50 MCG/ML IJ SOSY
50.0000 ug | PREFILLED_SYRINGE | Freq: Once | INTRAMUSCULAR | Status: AC
Start: 2021-08-22 — End: 2021-08-22
  Administered 2021-08-22: 50 ug via INTRAVENOUS
  Filled 2021-08-22: qty 1

## 2021-08-22 MED ORDER — DILTIAZEM HCL ER COATED BEADS 120 MG PO CP24
120.0000 mg | ORAL_CAPSULE | Freq: Every day | ORAL | Status: DC
  Administered 2021-08-23: 120 mg via ORAL
  Filled 2021-08-22: qty 1

## 2021-08-22 MED ORDER — AMIODARONE HCL 200 MG PO TABS
200.0000 mg | ORAL_TABLET | Freq: Every day | ORAL | Status: DC
  Administered 2021-08-23 – 2021-08-28 (×5): 200 mg via ORAL
  Filled 2021-08-22 (×5): qty 1

## 2021-08-22 MED ORDER — LEVOTHYROXINE SODIUM 25 MCG PO TABS
137.0000 ug | ORAL_TABLET | Freq: Every day | ORAL | Status: DC
  Administered 2021-08-23 – 2021-08-28 (×6): 137 ug via ORAL
  Filled 2021-08-22 (×6): qty 1

## 2021-08-22 MED ORDER — HYDROCODONE-ACETAMINOPHEN 5-325 MG PO TABS
1.0000 | ORAL_TABLET | Freq: Four times a day (QID) | ORAL | Status: DC | PRN
  Administered 2021-08-22: 1 via ORAL
  Administered 2021-08-23 (×2): 2 via ORAL
  Filled 2021-08-22: qty 1
  Filled 2021-08-22 (×2): qty 2

## 2021-08-22 MED ORDER — DULOXETINE HCL 30 MG PO CPEP: 30.0000 mg | ORAL_CAPSULE | Freq: Every morning | ORAL | Status: DC

## 2021-08-22 MED ORDER — METHOCARBAMOL 500 MG PO TABS
500.0000 mg | ORAL_TABLET | Freq: Once | ORAL | Status: AC
  Administered 2021-08-22: 500 mg via ORAL
  Filled 2021-08-22: qty 1

## 2021-08-22 MED ORDER — MORPHINE SULFATE (PF) 4 MG/ML IV SOLN
4.0000 mg | Freq: Once | INTRAVENOUS | Status: AC
  Administered 2021-08-22: 4 mg via INTRAVENOUS
  Filled 2021-08-22: qty 1

## 2021-08-22 MED ORDER — MORPHINE SULFATE (PF) 2 MG/ML IV SOLN: 0.5000 mg | INTRAVENOUS | Status: DC | PRN

## 2021-08-22 MED ORDER — EZETIMIBE 10 MG PO TABS
10.0000 mg | ORAL_TABLET | Freq: Every day | ORAL | Status: DC
  Administered 2021-08-23 – 2021-08-28 (×6): 10 mg via ORAL
  Filled 2021-08-22 (×5): qty 1

## 2021-08-22 MED ORDER — SENNA 8.6 MG PO TABS
1.0000 | ORAL_TABLET | Freq: Two times a day (BID) | ORAL | Status: DC
  Administered 2021-08-22 – 2021-08-28 (×12): 8.6 mg via ORAL
  Filled 2021-08-22 (×12): qty 1

## 2021-08-22 MED ORDER — METOPROLOL TARTRATE 25 MG PO TABS
25.0000 mg | ORAL_TABLET | Freq: Once | ORAL | Status: AC
  Administered 2021-08-22: 25 mg via ORAL
  Filled 2021-08-22: qty 1

## 2021-08-22 NOTE — H&P (Signed)
History and Physical    Kim [name] GQQ:[id number] DOB: March 28, 1935 DOA: 08/22/2021  PCP: Sueanne Margarita, DO  Patient coming from: Home  I have personally briefly reviewed patient's old medical records in [place]  Chief Complaint: fall  HPI: Kim [name] is a 86 y.o. female with medical history significant for CAD s/p DES to mid RCA, HCM, paroxysmal atrial fibrillation on Eliquis, mitral regurgitation, remote history of breast cancer s/p chemotherapy and radiation who presents s/p fall.  Patient was at the store today.  States she was holding her cane on the grocery cart and her right leg just gave away and fell on her right side.  She has history of knee replacement to the right knee although it is usually the left knee that gives her trouble.  Did not hit her head. She denies any dizziness or lightheadedness prior.  Currently just noting a lot of pain to her right hip but otherwise denies any chest pain, palpitations, shortness of breath.  ED Course:  She was afebrile, tachycardic in atrial fibrillation with RVR with rates up to 129 on room air.  No leukocytosis, hemoglobin at baseline around 11, platelet of 171.  Sodium of 130, K of 4.1, creatinine of 1.48 which appears chronic.  BG of 129.  mildly hypocalcemic at 8.6 albumin labs obtained.  CT head is negative.  X-ray shows displaced fracture of the right femoral neck She is on Eliquis once a day since BID dosing causes dizziness. Last dose evening of 11/18.   Orthopedic was consulted by ED PA who likely take her to  OR on Monday after Eliquis washout.  Hospitalist on-call for admission.  Review of Systems: Constitutional: No Weight Change, No Fever ENT/Mouth: No sore throat, No Rhinorrhea Eyes: No Eye Pain, No Vision Changes Cardiovascular: No Chest Pain, no SOB Respiratory: No Cough, No Sputum, No Wheezing, no Dyspnea  Gastrointestinal: No Nausea, No Vomiting, No Diarrhea, No Constipation, No Pain Genitourinary:  no Urinary Incontinence Musculoskeletal: No Arthralgias, No Myalgias Skin: No Skin Lesions, No Pruritus, Neuro: no Weakness, No Numbness Psych: No Anxiety/Panic, No Depression, no decrease appetite Heme/Lymph: No Bruising, No Bleeding  Social history Patient lives alone but has daughter who checks up on her   Past Medical History:  Diagnosis Date   Allergic rhinitis    Allergy    Arthritis    hands   Breast cancer (Kim [name])    Breast cancer of upper-outer quadrant of left female breast ([place]) 11/08/13   finished chemo and radiation 2015   CAD (coronary artery disease)    a. 12/2013 Cath/PCI: LM nl, LAD 20p, 67m, 30d, D1 30p, LCX 20p, 32m, OM1 20, Mo2 40p, RCA 30p, 42m (4.0x16 Promus Premier DES), 20d, RPL 30, RPDA 70p, 32m, EF 65-70%.   CHF (congestive heart failure) (HCC)    Diverticulosis    HOH (hard of hearing)    bilateral, wears hearing aids   Hx of adenomatous colonic polyps 02/1999   Hyperlipidemia    Hypertension    Hypertrophic obstructive cardiomyopathy (HOCM) [facility])    Cardiologist is Dr. [name]   Hypothyroidism    Iron deficiency anemia due to chronic blood loss 01/07/2016   Low back pain    gets ESI from Dr. Rennis Harding   Malabsorption of iron 01/07/2016   Myocardial infarction ([place]) 12/2013   very mild-with first chemo -placed stent x1   Persistent atrial fibrillation with rapid ventricular response ([place]) 10/14/2018   Personal history of radiation therapy  2015   S/P radiation therapy 05/14/2014-06/26/2014   1) Left Breast  / 50 Gy in 25 fractions, 2) Left Supraclavicular fossa/ 47.5 Gy in 25 fractions, 3) Left Posterior Axillary boost / 4.825 Gy in 25 fractions, 4) Left Breast boost / 10 Gy in 5 fractions    Wears hearing aid    left and right     Past Surgical History:  Procedure Laterality Date   ABDOMINAL HYSTERECTOMY     ANTERIOR AND POSTERIOR REPAIR N/A 01/17/2015   Procedure: CYSTOCELE REPAIR ;  Surgeon: Princess Bruins, MD;  Location: [facility];  Service:  Gynecology;  Laterality: N/A;   BILATERAL SALPINGOOPHORECTOMY     see Laurin Coder NP for GYN exams   BREAST BIOPSY Left 10/18/2013   BREAST BIOPSY Left 10/31/2013   BREAST LUMPECTOMY Left 11/05/2013   BREAST LUMPECTOMY WITH NEEDLE LOCALIZATION AND AXILLARY LYMPH NODE DISSECTION Left 11/05/2013   Procedure: LEFT BREAST LUMPECTOMY WITH NEEDLE LOCALIZATION and axillary lymph Node Dissection;  Surgeon: Edward Jolly, MD;  Location: [place];  Service: General;  Laterality: Left;   BREAST SURGERY  11/2013   left lumpectomy   CARDIOVERSION N/A 10/17/2018   Procedure: CARDIOVERSION;  Surgeon: Larey Dresser, MD;  Location: [place];  Service: Cardiovascular;  Laterality: N/A;   CARDIOVERSION N/A 11/08/2018   Procedure: CARDIOVERSION;  Surgeon: Larey Dresser, MD;  Location: [facility] ENDOSCOPY;  Service: Cardiovascular;  Laterality: N/A;   CARDIOVERSION N/A 08/13/2020   Procedure: CARDIOVERSION;  Surgeon: Larey Dresser, MD;  Location: [facility] ENDOSCOPY;  Service: Cardiovascular;  Laterality: N/A;   CARDIOVERSION N/A 03/13/2021   Procedure: CARDIOVERSION;  Surgeon: Larey Dresser, MD;  Location: [place];  Service: Cardiovascular;  Laterality: N/A;   CATARACT EXTRACTION W/ INTRAOCULAR LENS  IMPLANT, BILATERAL  2012   bilateral   COLONOSCOPY  11/04/2015   per Dr. Fuller Plan, adenomatous polyps, no repeats planned    EYE SURGERY  11/22/2006   cataracts, bilateral, intraocular lens implant   JOINT REPLACEMENT     2019   LEFT HEART CATHETERIZATION WITH CORONARY ANGIOGRAM N/A 12/19/2013   Procedure: [place];  Surgeon: Burnell Blanks, MD;  Location: [facility] CATH LAB;  Service: Cardiovascular;  Laterality: N/A;   lumbar epidural steroid injection     lumber spinal stenosis   LUMBAR LAMINECTOMY/DECOMPRESSION MICRODISCECTOMY Right 03/01/2018   Procedure: RIGHT LUMBAR FOUR- LUMBAR FIVE LAMINECTOMY/MICRODISCECTOMY;  Surgeon: [name], MD;   Location: [place];  Service: Neurosurgery;  Laterality: Right;   MITRAL VALVE REPAIR N/A 12/06/2018   Procedure: MITRAL VALVE REPAIR;  Surgeon: Sherren Mocha, MD;  Location: [place] CV LAB;  Service: Cardiovascular;  Laterality: N/A;   POLYPECTOMY     PORTACATH PLACEMENT Right 12/11/2013   Procedure: INSERTION PORT-A-CATH;  Surgeon: Edward Jolly, MD;  Location: [place];  Service: General;  Laterality: Right;  Subclavian Vein;    RIGHT/LEFT HEART CATH AND CORONARY ANGIOGRAPHY N/A 11/03/2018   Procedure: RIGHT/LEFT HEART CATH AND CORONARY ANGIOGRAPHY;  Surgeon: Larey Dresser, MD;  Location: [place] CV LAB;  Service: Cardiovascular;  Laterality: N/A;   TEE WITHOUT CARDIOVERSION N/A 10/17/2018   Procedure: TRANSESOPHAGEAL ECHOCARDIOGRAM (TEE);  Surgeon: Larey Dresser, MD;  Location: [facility] ENDOSCOPY;  Service: Cardiovascular;  Laterality: N/A;   TEE WITHOUT CARDIOVERSION N/A 11/08/2018   Procedure: TRANSESOPHAGEAL ECHOCARDIOGRAM (TEE);  Surgeon: Larey Dresser, MD;  Location: [facility] ENDOSCOPY;  Service: Cardiovascular;  Laterality: N/A;   TOTAL KNEE ARTHROPLASTY Right  10/24/2017   Procedure: TOTAL RIGHT KNEE ARTHROPLASTY;  Surgeon: Gaynelle Arabian, MD;  Location: [facility];  Service: Orthopedics;  Laterality: Right;     reports that she has never smoked. She has never used smokeless tobacco. She reports that she does not drink alcohol and does not use drugs. Social History  Allergies  Allergen Reactions   Penicillins Anaphylaxis and Swelling    FACIAL SWELLING PATIENT HAS HAD A PCN REACTION WITH IMMEDIATE RASH, FACIAL/TONGUE/THROAT SWELLING, SOB, OR LIGHTHEADEDNESS WITH HYPOTENSION:  #  #  YES  #  #  Has patient had a PCN reaction causing severe rash involving mucus membranes or skin necrosis: No Has patient had a PCN reaction that required hospitalization: No Has patient had a PCN reaction occurring within the last 10 years: No   Propoxyphene N-Acetaminophen Swelling     tongue swelling   Evolocumab Other (See Comments)    Myalgias, fatigue, some itching and burning on her feet,  Arthralgia (Joint Pain)   Levaquin [Levofloxacin] Hives   Statins Other (See Comments)    Joint pain   Cortisone Rash   Myrbetriq [Mirabegron] Rash    Rash on face   Pseudoephedrine Other (See Comments)    Facial flushing    Family History  Problem Relation Age of Onset   Alcohol abuse Father    Kidney failure Mother    Colon cancer Neg Hx    Rectal cancer Neg Hx    Stomach cancer Neg Hx    Colon polyps Neg Hx      Prior to Admission medications   Medication Sig Start Date End Date Taking? Authorizing Provider  acetaminophen (TYLENOL) 650 MG CR tablet Take 1,950 mg by mouth every 3 (three) hours.    [provider]  amiodarone (PACERONE) 200 MG tablet Take 1 tablet (200 mg total) by mouth daily. 07/13/21   Larey Dresser, MD  diltiazem (CARDIZEM CD) 120 MG 24 hr capsule TAKE 1 CAPSULE BY MOUTH ONCE DAILY 08/27/20   Larey Dresser, MD  DULoxetine (CYMBALTA) 30 MG capsule Take 30 mg by mouth in the morning.    [provider]  ELIQUIS 5 MG TABS tablet TAKE 1 TABLET BY MOUTH TWICE (2) DAILY 02/09/21   Larey Dresser, MD  exemestane (AROMASIN) 25 MG tablet TAKE 1 TABLET BY MOUTH ONCE DAILY AFTER BREAKFAST 03/03/21   [name], MD  ezetimibe (ZETIA) 10 MG tablet TAKE 1 TABLET BY MOUTH ONCE DAILY 03/04/21   Larey Dresser, MD  levothyroxine (SYNTHROID, LEVOTHROID) 137 MCG tablet Take 1 tablet (137 mcg total) by mouth daily before breakfast. 08/08/18   Laurey Morale, MD  metoprolol succinate (TOPROL-XL) 25 MG 24 hr tablet Take 1 tablet (25 mg total) by mouth 2 (two) times daily. Take with or immediately following a meal. 01/27/21   Clegg, Amy D, NP  Multiple Vitamin (MULTIVITAMIN WITH MINERALS) TABS tablet Take 1 tablet by mouth daily.    [provider]  nitroGLYCERIN (NITROSTAT) 0.4 MG SL tablet Place 1 tablet (0.4 mg total) under the tongue  every 5 (five) minutes as needed for chest pain. 07/24/20 07/24/21  Clegg, Amy D, NP  potassium chloride (KLOR-CON) 10 MEQ tablet Take 2 tablets (20 mEq total) by mouth daily as needed (Only take 20 meq if you take lasix). Patient not taking: Reported on 07/13/2021 03/13/21   Larey Dresser, MD  ferrous sulfate 325 (65 FE) MG tablet Take 325 mg by mouth 2 (two) times daily.  01/16/19  [provider]  pravastatin (PRAVACHOL) 80 MG tablet Take 1 tablet (80 mg total) by mouth every evening. 11/20/15 01/16/19  Larey Dresser, MD    Physical Exam: Vitals:   08/22/21 1800 08/22/21 1815 08/22/21 1845 08/22/21 1900  BP: (!) 124/96 120/78 (!) 134/107 (!) 130/113  Pulse: (!) 107 (!) 112 (!) 115 (!) 116  Resp: (!) 22 17 (!) 24   Temp:      TempSrc:      SpO2: 98% 97% 98%     Constitutional: NAD, calm, comfortable, elderly female laying flat in bed Vitals:   08/22/21 1800 08/22/21 1815 08/22/21 1845 08/22/21 1900  BP: (!) 124/96 120/78 (!) 134/107 (!) 130/113  Pulse: (!) 107 (!) 112 (!) 115 (!) 116  Resp: (!) 22 17 (!) 24   Temp:      TempSrc:      SpO2: 98% 97% 98%    Eyes: PERRL, lids and conjunctivae normal ENMT: Mucous membranes are moist.  Neck: normal, supple Respiratory: clear to auscultation bilaterally, no wheezing, no crackles. Normal respiratory effort.  Cardiovascular: Regular rate and rhythm, no murmurs / rubs / gallops. No extremity edema. 2+ pedal pulses.  Abdomen: no tenderness, no masses palpated.  Bowel sounds positive.  Musculoskeletal: no clubbing / cyanosis. No joint deformity upper and lower extremities. Good ROM, no contractures. Normal muscle tone.  Skin:areas of focal  bruising noted to upper and lower extremities Neurologic: CN 2-12 grossly intact. Sensation intact, DTR normal. Unable to lift right LE due to hip pain Psychiatric: Normal judgment and insight. Alert and oriented x 3. Normal mood.     Labs on Admission: I have personally reviewed  following labs and imaging studies  CBC: Recent Labs  Lab 08/22/21 1515  WBC 6.9  HGB 11.0*  HCT 34.6*  MCV 97.2  PLT 403   Basic Metabolic Panel: Recent Labs  Lab 08/22/21 1515  NA 136  K 4.1  CL 103  CO2 23  GLUCOSE 129*  BUN 32*  CREATININE 1.48*  CALCIUM 8.6*   GFR: CrCl cannot be calculated (Unknown ideal weight.). Liver Function Tests: No results for input(s): AST, ALT, ALKPHOS, BILITOT, PROT, ALBUMIN in the last 168 hours. No results for input(s): LIPASE, AMYLASE in the last 168 hours. No results for input(s): AMMONIA in the last 168 hours. Coagulation Profile: No results for input(s): INR, PROTIME in the last 168 hours. Cardiac Enzymes: No results for input(s): CKTOTAL, CKMB, CKMBINDEX, TROPONINI in the last 168 hours. BNP (last 3 results) No results for input(s): PROBNP in the last 8760 hours. HbA1C: No results for input(s): HGBA1C in the last 72 hours. CBG: No results for input(s): GLUCAP in the last 168 hours. Lipid Profile: No results for input(s): CHOL, HDL, LDLCALC, TRIG, CHOLHDL, LDLDIRECT in the last 72 hours. Thyroid Function Tests: No results for input(s): TSH, T4TOTAL, FREET4, T3FREE, THYROIDAB in the last 72 hours. Anemia Panel: No results for input(s): VITAMINB12, FOLATE, FERRITIN, TIBC, IRON, RETICCTPCT in the last 72 hours. Urine analysis:    Component Value Date/Time   COLORURINE YELLOW 01/23/2021 1824   APPEARANCEUR CLOUDY (A) 01/23/2021 1824   LABSPEC 1.020 01/23/2021 1824   PHURINE 5.5 01/23/2021 1824   GLUCOSEU NEGATIVE 01/23/2021 1824   HGBUR TRACE (A) 01/23/2021 1824   BILIRUBINUR NEGATIVE 01/23/2021 1824   BILIRUBINUR N 10/25/2016 [place] 01/23/2021 1824   PROTEINUR NEGATIVE 01/23/2021 1824   UROBILINOGEN 0.2 10/25/2016 1022   NITRITE NEGATIVE 01/23/2021 1824  LEUKOCYTESUR TRACE (A) 01/23/2021 1824    Radiological Exams on Admission: DG Pelvis 1-2 Views  Result Date: 08/22/2021 CLINICAL DATA:  Fall  with right leg pain. EXAM: PELVIS - 1-2 VIEW COMPARISON:  None. FINDINGS: Fracture of the right femoral neck as described on concurrent femur radiographs. Bony irregularity at the pubic symphysis favored to be degenerative. SI joints are open and symmetric. Nonobstructive bowel gas pattern. IMPRESSION: 1. Fracture of the right femoral neck as described on concurrent femur radiographs. 2. There is bony irregularity at the pubic symphysis which could be due to degenerative change. Electronically Signed   By: Audie Pinto M.D.   On: 08/22/2021 16:41   CT Head Wo Contrast  Result Date: 08/22/2021 CLINICAL DATA:  Trauma, fall EXAM: CT HEAD WITHOUT CONTRAST TECHNIQUE: Contiguous axial images were obtained from the base of the skull through the vertex without intravenous contrast. COMPARISON:  01/23/2021 FINDINGS: Brain: There are no signs of bleeding. There is prominence of third and both lateral ventricles. There is no shift of midline structures. Cortical sulci are prominent. There is decreased density in subcortical and periventricular white matter. Vascular: There are scattered arterial calcifications. Skull: Unremarkable. Sinuses/Orbits: Unremarkable. Other: None IMPRESSION: There are no signs of bleeding within the cranium. Central and cortical atrophy. Small-vessel disease. Electronically Signed   By: Elmer Picker M.D.   On: 08/22/2021 17:27   DG Femur Min 2 Views Right  Result Date: 08/22/2021 CLINICAL DATA:  Patient had a fall.  Pain in the right leg. EXAM: RIGHT FEMUR 2 VIEWS COMPARISON:  None. FINDINGS: There is a displaced fracture of the right femoral neck with separation of the lesser trochanter. No evidence of dislocation. There is bony irregularity at the pubic symphysis, likely degenerative. IMPRESSION: Displaced fracture of the right femoral neck. Electronically Signed   By: Audie Pinto M.D.   On: 08/22/2021 16:38      Assessment/Plan  Displaced right femoral neck  fracture -ortho consulted and plans to take to OR on Monday following Eliquis wash out. Placed on Buck traction with 5lbs per Dr. Alvan Dame. -Hip fracture protocol initiated -PRN hydrocodone and IV morphine for pain  - Given hx of HOCM, CAD and atrial fibrillation- need to consider cardiology consult for clearance in the morning  Paroxysmal atrial fibrillation with RVR HOCM -has hx of RVR requiring multiple cardioversion in the past thought due to severe mitral valve regurgitation.  Had Mitraclip placement in 2020 -Has not taken the second dose of metoprolol prior to arrival.  Was given 25 mg of metoprolol tartrate in the ED. -continue to monitor on continuous telemetry -Continue home amiodarone, diltiazem -Hold Eliquis for planned hip repair on Monday.  CHA2DVASc score of 6. Risk of possible stroke while off Eliquis was discussed with patient and daughter at bedside.  CAD s/p DES to mid RCA -Asymptomatic  Hypothyroidism - Continue levothyroxine  History of breast cancer s/p chemo and radiation - Continue exemestane  DVT prophylaxis:.Lovenox Code Status: Full Family Communication: Plan discussed with patient at bedside  disposition Plan: Home with at least 2 midnight stays  Consults called: Ortho Admission status: inpatient  Level of care: Telemetry Cardiac  Status is: Inpatient  Remains inpatient appropriate because: Displaced hip fracture on anticoagulation requiring surgical repair and pain control         Aijalon Kirtz T Aylana Hirschfeld DO [facility]   If 7PM-7AM, please contact night-coverage [email or web address]   08/22/2021, 7:10 PM

## 2021-08-22 NOTE — Progress Notes (Signed)
Orthopedic Tech Progress Note Patient Details:  Kim [name] [birth year] [id number]  Musculoskeletal Traction Type of Traction: Bucks Skin Traction Traction Location: rle Traction Weight: 5 lbs   Post Interventions Patient Tolerated: Well Instructions Provided: Care of device  [name] 08/22/2021, 11:12 PM

## 2021-08-22 NOTE — ED Triage Notes (Signed)
Pt states she was just walking at the store and her right hip just gave out and she fell denies LOC denies hitting head pt is on eliquis. Ems reports shortening and outward rotation

## 2021-08-22 NOTE — ED Provider Notes (Signed)
  Physical Exam  BP (!) 146/86   Pulse (!) 129   Temp (!) 97.5 F (36.4 C) (Oral)   Resp (!) 28   LMP 10/19/1991   SpO2 98%   Physical Exam Vitals and nursing note reviewed.  Constitutional:      General: She is not in acute distress.    Appearance: Normal appearance.     Comments: Uncomfortable due to pain, no acute distress  HENT:     Head: Normocephalic and atraumatic.  Eyes:     Extraocular Movements: Extraocular movements intact.     Conjunctiva/sclera: Conjunctivae normal.     Pupils: Pupils are equal, round, and reactive to light.  Cardiovascular:     Rate and Rhythm: Regular rhythm. Tachycardia present.     Pulses: Normal pulses.  Pulmonary:     Effort: Pulmonary effort is normal. No respiratory distress.     Breath sounds: Normal breath sounds. No wheezing.     Comments: Speaking in full sentences.  Clear lung sounds in all fields. Abdominal:     General: There is no distension.     Palpations: Abdomen is soft. There is no mass.     Tenderness: There is no abdominal tenderness. There is no guarding or rebound.  Musculoskeletal:        General: Deformity present.     Cervical back: Normal range of motion and neck supple.     Comments: Deformity of the R hip with R leg external rotation and shortening   Skin:    General: Skin is warm and dry.     Capillary Refill: Capillary refill takes less than 2 seconds.  Neurological:     Mental Status: She is alert and oriented to person, place, and time.  Psychiatric:        Mood and Affect: Mood and affect normal.        Speech: Speech normal.        Behavior: Behavior normal.    ED Course/Procedures     Procedures  MDM   Patient signed out to me by S. Smoot, PA-C.  Please see previous notes for further history.  In brief, patient presented for evaluation after a fall.  On exam, patient has deformity of the right hip with shortening and external rotation.  She is neurovascular intact.  Pending labs and  imaging.  Imaging viewed and independently turbid by me, consistent with a right hip fracture.  Chest x-ray negative.  CT head without acute findings.  EKG was tachycardic, likely secondary to pain and not having afternoon medications.  Discussed with Dr. Alvan Dame from orthopedics who recommends placing patient in [place] traction with 5 pounds, holding Eliquis if able with a possible plan for surgery on Monday, and admission to medicine.  Discussed with Dr. Flossie Buffy from [facility], patient to be admitted.      Franchot Heidelberg, PA-C 08/22/21 1900    Dorie Rank, MD 08/23/21 1315

## 2021-08-22 NOTE — ED Notes (Signed)
Patient transported to CT 

## 2021-08-22 NOTE — ED Provider Notes (Signed)
[facility] EMERGENCY DEPARTMENT Provider Note   CSN: [id number] Arrival date & time: 08/22/21  1401     History Chief Complaint  Patient presents with   Lytle Michaels    Kim [name] is a 86 y.o. female.  Patient with history of A. fib on Eliquis presents today with complaint of fall.  She states that same occurred occurred earlier today when she was shopping at [employer].  She says that she was walking and her leg gave out and she fell landing on her right side.  She endorses potentially losing consciousness, however is unsure, states "it all happened so fast."  She does not believe she hit her head, denies headache, dizziness, lightheadedness, blurred vision.  She was unable to ambulate following the event.  Pain noted to right leg with deformity and outward rotation and shortening of the right leg. Patient is compliant with Elliquis and endorses no missed doses.  The history is provided by the patient. No language interpreter was used.  Fall Pertinent negatives include no chest pain, no abdominal pain, no headaches and no shortness of breath.      Past Medical History:  Diagnosis Date   Allergic rhinitis    Allergy    Arthritis    hands   Breast cancer ([place])    Breast cancer of upper-outer quadrant of left female breast ([place]) 11/08/13   finished chemo and radiation 2015   CAD (coronary artery disease)    a. 12/2013 Cath/PCI: LM nl, LAD 20p, 32m, 30d, D1 30p, LCX 20p, 71m, OM1 20, Mo2 40p, RCA 30p, 4m (4.0x16 Promus Premier DES), 20d, RPL 30, RPDA 70p, 59m, EF 65-70%.   CHF (congestive heart failure) (HCC)    Diverticulosis    HOH (hard of hearing)    bilateral, wears hearing aids   Hx of adenomatous colonic polyps 02/1999   Hyperlipidemia    Hypertension    Hypertrophic obstructive cardiomyopathy (HOCM) ([place])    Cardiologist is Dr. [name]   Hypothyroidism    Iron deficiency anemia due to chronic blood loss 01/07/2016   Low back pain    gets ESI from Dr.  Rennis Harding   Malabsorption of iron 01/07/2016   Myocardial infarction ([place]) 12/2013   very mild-with first chemo -placed stent x1   Persistent atrial fibrillation with rapid ventricular response ([place]) 10/14/2018   Personal history of radiation therapy 2015   S/P radiation therapy 05/14/2014-06/26/2014   1) Left Breast  / 50 Gy in 25 fractions, 2) Left Supraclavicular fossa/ 47.5 Gy in 25 fractions, 3) Left Posterior Axillary boost / 4.825 Gy in 25 fractions, 4) Left Breast boost / 10 Gy in 5 fractions    Wears hearing aid    left and right     Patient Active Problem List   Diagnosis Date Noted   Severe mitral regurgitation 12/06/2018   Persistent atrial fibrillation with rapid ventricular response ([place]) 10/14/2018   HNP (herniated nucleus pulposus), lumbar 03/01/2018   History of total knee replacement, right 11/13/2017   OA (osteoarthritis) of knee 10/24/2017   Chronic pain of right knee 06/17/2017   Osteoporosis 07/30/2016   Iron deficiency anemia due to chronic blood loss 01/07/2016   Malabsorption of iron 01/07/2016   Postoperative state 01/17/2015   HOCM (hypertrophic obstructive cardiomyopathy) ([place]) 01/09/2014   Breast cancer ([place]) 01/09/2014   Coronary atherosclerosis of native coronary artery 12/20/2013   CAD (coronary artery disease)    Hyperlipidemia    Malignant neoplasm of upper-outer  quadrant of left breast in female, estrogen receptor positive ([place]) 11/05/2013   DCIS (ductal carcinoma in situ) of breast 10/26/2013   Hypertension 04/05/2012   UNSPECIFIED HEARING LOSS 08/07/2010   LOW BACK PAIN 04/22/2009   CONSTIPATION 11/04/2008   COLONIC POLYPS, HX OF 11/01/2008   ALLERGIC RHINITIS 06/27/2007   Hypothyroidism 06/22/2007    Past Surgical History:  Procedure Laterality Date   ABDOMINAL HYSTERECTOMY     ANTERIOR AND POSTERIOR REPAIR N/A 01/17/2015   Procedure: CYSTOCELE REPAIR ;  Surgeon: Princess Bruins, MD;  Location: [facility];  Service: Gynecology;  Laterality:  N/A;   BILATERAL SALPINGOOPHORECTOMY     see Laurin Coder NP for GYN exams   BREAST BIOPSY Left 10/18/2013   BREAST BIOPSY Left 10/31/2013   BREAST LUMPECTOMY Left 11/05/2013   BREAST LUMPECTOMY WITH NEEDLE LOCALIZATION AND AXILLARY LYMPH NODE DISSECTION Left 11/05/2013   Procedure: LEFT BREAST LUMPECTOMY WITH NEEDLE LOCALIZATION and axillary lymph Node Dissection;  Surgeon: Edward Jolly, MD;  Location: [place];  Service: General;  Laterality: Left;   BREAST SURGERY  11/2013   left lumpectomy   CARDIOVERSION N/A 10/17/2018   Procedure: CARDIOVERSION;  Surgeon: Larey Dresser, MD;  Location: [place];  Service: Cardiovascular;  Laterality: N/A;   CARDIOVERSION N/A 11/08/2018   Procedure: CARDIOVERSION;  Surgeon: Larey Dresser, MD;  Location: [facility] ENDOSCOPY;  Service: Cardiovascular;  Laterality: N/A;   CARDIOVERSION N/A 08/13/2020   Procedure: CARDIOVERSION;  Surgeon: Larey Dresser, MD;  Location: [facility] ENDOSCOPY;  Service: Cardiovascular;  Laterality: N/A;   CARDIOVERSION N/A 03/13/2021   Procedure: CARDIOVERSION;  Surgeon: Larey Dresser, MD;  Location: [place];  Service: Cardiovascular;  Laterality: N/A;   CATARACT EXTRACTION W/ INTRAOCULAR LENS  IMPLANT, BILATERAL  2012   bilateral   COLONOSCOPY  11/04/2015   per Dr. Fuller Plan, adenomatous polyps, no repeats planned    EYE SURGERY  11/22/2006   cataracts, bilateral, intraocular lens implant   JOINT REPLACEMENT     2019   LEFT HEART CATHETERIZATION WITH CORONARY ANGIOGRAM N/A 12/19/2013   Procedure: [place];  Surgeon: Burnell Blanks, MD;  Location: [facility] CATH LAB;  Service: Cardiovascular;  Laterality: N/A;   lumbar epidural steroid injection     lumber spinal stenosis   LUMBAR LAMINECTOMY/DECOMPRESSION MICRODISCECTOMY Right 03/01/2018   Procedure: RIGHT LUMBAR FOUR- LUMBAR FIVE LAMINECTOMY/MICRODISCECTOMY;  Surgeon: [name], MD;  Location: [place];   Service: Neurosurgery;  Laterality: Right;   MITRAL VALVE REPAIR N/A 12/06/2018   Procedure: MITRAL VALVE REPAIR;  Surgeon: Sherren Mocha, MD;  Location: [place] CV LAB;  Service: Cardiovascular;  Laterality: N/A;   POLYPECTOMY     PORTACATH PLACEMENT Right 12/11/2013   Procedure: INSERTION PORT-A-CATH;  Surgeon: Edward Jolly, MD;  Location: [place];  Service: General;  Laterality: Right;  Subclavian Vein;    RIGHT/LEFT HEART CATH AND CORONARY ANGIOGRAPHY N/A 11/03/2018   Procedure: RIGHT/LEFT HEART CATH AND CORONARY ANGIOGRAPHY;  Surgeon: Larey Dresser, MD;  Location: [place] CV LAB;  Service: Cardiovascular;  Laterality: N/A;   TEE WITHOUT CARDIOVERSION N/A 10/17/2018   Procedure: TRANSESOPHAGEAL ECHOCARDIOGRAM (TEE);  Surgeon: Larey Dresser, MD;  Location: [facility] ENDOSCOPY;  Service: Cardiovascular;  Laterality: N/A;   TEE WITHOUT CARDIOVERSION N/A 11/08/2018   Procedure: TRANSESOPHAGEAL ECHOCARDIOGRAM (TEE);  Surgeon: Larey Dresser, MD;  Location: [facility] ENDOSCOPY;  Service: Cardiovascular;  Laterality: N/A;   TOTAL KNEE ARTHROPLASTY Right 10/24/2017   Procedure: TOTAL  RIGHT KNEE ARTHROPLASTY;  Surgeon: Gaynelle Arabian, MD;  Location: [facility];  Service: Orthopedics;  Laterality: Right;     OB History     Gravida  2   Para  2   Term      Preterm      AB      Living         SAB      IAB      Ectopic      Multiple      Live Births           Obstetric Comments  Menarche age [age], parity age 10, G16,P2,  No BC, HRT x 20 years         Family History  Problem Relation Age of Onset   Alcohol abuse Father    Kidney failure Mother    Colon cancer Neg Hx    Rectal cancer Neg Hx    Stomach cancer Neg Hx    Colon polyps Neg Hx     Social History   Tobacco Use   Smoking status: Never   Smokeless tobacco: Never   Tobacco comments:    never used tobacco  Vaping Use   Vaping Use: Never used  Substance Use Topics   Alcohol use: No     Alcohol/week: 0.0 standard drinks   Drug use: No    Home Medications Prior to Admission medications   Medication Sig Start Date End Date Taking? Authorizing Provider  acetaminophen (TYLENOL) 650 MG CR tablet Take 1,950 mg by mouth every 3 (three) hours.    [provider]  amiodarone (PACERONE) 200 MG tablet Take 1 tablet (200 mg total) by mouth daily. 07/13/21   Larey Dresser, MD  diltiazem (CARDIZEM CD) 120 MG 24 hr capsule TAKE 1 CAPSULE BY MOUTH ONCE DAILY 08/27/20   Larey Dresser, MD  DULoxetine (CYMBALTA) 30 MG capsule Take 30 mg by mouth in the morning.    [provider]  ELIQUIS 5 MG TABS tablet TAKE 1 TABLET BY MOUTH TWICE (2) DAILY 02/09/21   Larey Dresser, MD  exemestane (AROMASIN) 25 MG tablet TAKE 1 TABLET BY MOUTH ONCE DAILY AFTER BREAKFAST 03/03/21   [name], MD  ezetimibe (ZETIA) 10 MG tablet TAKE 1 TABLET BY MOUTH ONCE DAILY 03/04/21   Larey Dresser, MD  levothyroxine (SYNTHROID, LEVOTHROID) 137 MCG tablet Take 1 tablet (137 mcg total) by mouth daily before breakfast. 08/08/18   Laurey Morale, MD  metoprolol succinate (TOPROL-XL) 25 MG 24 hr tablet Take 1 tablet (25 mg total) by mouth 2 (two) times daily. Take with or immediately following a meal. 01/27/21   Clegg, Amy D, NP  Multiple Vitamin (MULTIVITAMIN WITH MINERALS) TABS tablet Take 1 tablet by mouth daily.    [provider]  nitroGLYCERIN (NITROSTAT) 0.4 MG SL tablet Place 1 tablet (0.4 mg total) under the tongue every 5 (five) minutes as needed for chest pain. 07/24/20 07/24/21  Clegg, Amy D, NP  potassium chloride (KLOR-CON) 10 MEQ tablet Take 2 tablets (20 mEq total) by mouth daily as needed (Only take 20 meq if you take lasix). Patient not taking: Reported on 07/13/2021 03/13/21   Larey Dresser, MD  ferrous sulfate 325 (65 FE) MG tablet Take 325 mg by mouth 2 (two) times daily.    01/16/19  [provider]  pravastatin (PRAVACHOL) 80 MG tablet Take 1 tablet (80 mg  total) by mouth every evening. 11/20/15 01/16/19  Larey Dresser, MD    Allergies    Penicillins, Propoxyphene n-acetaminophen, Evolocumab, Levaquin [levofloxacin], Statins, Cortisone, Myrbetriq [mirabegron], and Pseudoephedrine  Review of Systems   Review of Systems  Constitutional:  Negative for chills and fever.  Respiratory:  Negative for cough, shortness of breath, wheezing and stridor.   Cardiovascular:  Negative for chest pain.  Gastrointestinal:  Negative for abdominal pain, diarrhea, nausea and vomiting.  Musculoskeletal:  Positive for arthralgias and gait problem. Negative for back pain.  Neurological:  Negative for dizziness, tremors, seizures, syncope, facial asymmetry, speech difficulty, weakness, light-headedness, numbness and headaches.  Hematological:  Bruises/bleeds easily.  Psychiatric/Behavioral:  Negative for confusion and decreased concentration.   All other systems reviewed and are negative.  Physical Exam Updated Vital Signs BP (!) 146/86   Pulse (!) 129   Temp (!) 97.5 F (36.4 C) (Oral)   Resp (!) 28   LMP 10/19/1991   SpO2 98%   Physical Exam Vitals and nursing note reviewed.  Constitutional:      General: She is not in acute distress.    Appearance: Normal appearance. She is normal weight. She is not ill-appearing, toxic-appearing or diaphoretic.  HENT:     Head: Normocephalic and atraumatic.  Eyes:     Extraocular Movements: Extraocular movements intact.     Pupils: Pupils are equal, round, and reactive to light.  Cardiovascular:     Rate and Rhythm: Normal rate and regular rhythm.     Heart sounds: Normal heart sounds.  Pulmonary:     Effort: Pulmonary effort is normal. No respiratory distress.     Breath sounds: Normal breath sounds.  Abdominal:     General: Abdomen is flat. There is no distension.     Palpations: Abdomen is soft.  Musculoskeletal:     Cervical back: Normal range of motion and neck supple.     Comments: Pain and deformity  noted to midline of right femur with swelling.  Right leg is externally rotated and shortened.  Distal pulses intact and 2+  Skin:    General: Skin is warm and dry.  Neurological:     General: No focal deficit present.     Mental Status: She is alert and oriented to person, place, and time.  Psychiatric:        Mood and Affect: Mood normal.        Behavior: Behavior normal.    ED Results / Procedures / Treatments   Labs (all labs ordered are listed, but only abnormal results are displayed) Labs Reviewed  RESP PANEL BY RT-PCR (FLU A&B, COVID) ARPGX2  CBC  BASIC METABOLIC PANEL    EKG None  Radiology No results found.  Procedures Procedures   Medications Ordered in ED Medications  morphine 4 MG/ML injection 4 mg (has no administration in time range)    ED Course  I have reviewed the triage vital signs and the nursing notes.  Pertinent labs & imaging results that were available during my care of the patient were reviewed by me and considered in my medical decision making (see chart for details).    MDM Rules/Calculators/A&P                         Patient presents today with fall on Elliquis. Deformity noted to the right thigh with external rotation and shortening of the right leg. Distal pulses intact. Suspect fracture is present and that patient will require admission for management of this. Imaging  and labs pending at shift change.  Care handoff to [facility], PA-C at shift change. Please see her note for further evaluation and dispo.   Final Clinical Impression(s) / ED Diagnoses Final diagnoses:  Fall    Rx / DC Orders ED Discharge Orders     None        Nestor Lewandowsky 08/23/21 0645    Fredia Sorrow, MD 08/23/21 [phone]

## 2021-08-23 ENCOUNTER — Inpatient Hospital Stay (HOSPITAL_COMMUNITY): Payer: Medicare PPO

## 2021-08-23 ENCOUNTER — Encounter (HOSPITAL_COMMUNITY): Payer: Self-pay | Admitting: Family Medicine

## 2021-08-23 ENCOUNTER — Inpatient Hospital Stay (HOSPITAL_COMMUNITY): Payer: Medicare PPO | Admitting: Anesthesiology

## 2021-08-23 ENCOUNTER — Encounter (HOSPITAL_COMMUNITY)
Admission: EM | Disposition: A | Discharge status: Skilled Nursing Facility | Payer: Self-pay | Source: Home / Self Care | Attending: Internal Medicine

## 2021-08-23 DIAGNOSIS — Z853 Personal history of malignant neoplasm of breast: Secondary | ICD-10-CM

## 2021-08-23 DIAGNOSIS — I480 Paroxysmal atrial fibrillation: Secondary | ICD-10-CM | POA: Diagnosis not present

## 2021-08-23 DIAGNOSIS — S72001A Fracture of unspecified part of neck of right femur, initial encounter for closed fracture: Secondary | ICD-10-CM | POA: Diagnosis not present

## 2021-08-23 HISTORY — PX: INTRAMEDULLARY (IM) NAIL INTERTROCHANTERIC: SHX5875

## 2021-08-23 LAB — SURGICAL PCR SCREEN
MRSA, PCR: NEGATIVE
Staphylococcus aureus: NEGATIVE

## 2021-08-23 SURGERY — FIXATION, FRACTURE, INTERTROCHANTERIC, WITH INTRAMEDULLARY ROD
Anesthesia: General | Site: Hip | Laterality: Right

## 2021-08-23 MED ORDER — FERROUS SULFATE 325 (65 FE) MG PO TABS
325.0000 mg | ORAL_TABLET | Freq: Two times a day (BID) | ORAL | Status: DC
  Administered 2021-08-24 – 2021-08-28 (×8): 325 mg via ORAL
  Filled 2021-08-23 (×9): qty 1

## 2021-08-23 MED ORDER — LIDOCAINE 2% (20 MG/ML) 5 ML SYRINGE
INTRAMUSCULAR | Status: DC | PRN
  Administered 2021-08-23: 60 mg via INTRAVENOUS

## 2021-08-23 MED ORDER — MUPIROCIN 2 % EX OINT
1.0000 "application " | TOPICAL_OINTMENT | Freq: Two times a day (BID) | CUTANEOUS | Status: AC
  Administered 2021-08-23 – 2021-08-27 (×10): 1 via NASAL
  Filled 2021-08-23 (×2): qty 22

## 2021-08-23 MED ORDER — METOCLOPRAMIDE HCL 5 MG PO TABS: 5.0000 mg | ORAL_TABLET | Freq: Three times a day (TID) | ORAL | Status: DC | PRN

## 2021-08-23 MED ORDER — CHLORHEXIDINE GLUCONATE 4 % EX LIQD
60.0000 mL | Freq: Once | CUTANEOUS | Status: AC
  Administered 2021-08-23: 4 via TOPICAL
  Filled 2021-08-23: qty 15

## 2021-08-23 MED ORDER — VANCOMYCIN HCL 1000 MG/200ML IV SOLN
1000.0000 mg | Freq: Two times a day (BID) | INTRAVENOUS | Status: AC
  Administered 2021-08-24: 1000 mg via INTRAVENOUS
  Filled 2021-08-23: qty 200

## 2021-08-23 MED ORDER — MORPHINE SULFATE (PF) 2 MG/ML IV SOLN
0.5000 mg | INTRAVENOUS | Status: DC | PRN
  Administered 2021-08-24: 1 mg via INTRAVENOUS
  Filled 2021-08-23: qty 1

## 2021-08-23 MED ORDER — ORAL CARE MOUTH RINSE: 15.0000 mL | Freq: Once | OROMUCOSAL | Status: DC

## 2021-08-23 MED ORDER — POVIDONE-IODINE 10 % EX SWAB: 2.0000 "application " | Freq: Once | CUTANEOUS | Status: DC

## 2021-08-23 MED ORDER — METOPROLOL TARTRATE 5 MG/5ML IV SOLN: 5.0000 mg | Freq: Four times a day (QID) | INTRAVENOUS | Status: DC | PRN

## 2021-08-23 MED ORDER — PROPOFOL 10 MG/ML IV BOLUS
INTRAVENOUS | Status: AC
  Filled 2021-08-23: qty 20

## 2021-08-23 MED ORDER — HYDROCODONE-ACETAMINOPHEN 5-325 MG PO TABS
1.0000 | ORAL_TABLET | ORAL | Status: DC | PRN
  Administered 2021-08-24: 2 via ORAL
  Administered 2021-08-24 – 2021-08-28 (×6): 1 via ORAL
  Filled 2021-08-23 (×4): qty 1
  Filled 2021-08-23: qty 2
  Filled 2021-08-23 (×3): qty 1

## 2021-08-23 MED ORDER — PHENYLEPHRINE 40 MCG/ML (10ML) SYRINGE FOR IV PUSH (FOR BLOOD PRESSURE SUPPORT)
PREFILLED_SYRINGE | INTRAVENOUS | Status: DC | PRN
  Administered 2021-08-23 (×2): 120 ug via INTRAVENOUS

## 2021-08-23 MED ORDER — ACETAMINOPHEN 10 MG/ML IV SOLN
1000.0000 mg | Freq: Once | INTRAVENOUS | Status: DC | PRN
  Administered 2021-08-23: 1000 mg via INTRAVENOUS

## 2021-08-23 MED ORDER — AMISULPRIDE (ANTIEMETIC) 5 MG/2ML IV SOLN: 5.0000 mg | Freq: Once | INTRAVENOUS | Status: DC | PRN

## 2021-08-23 MED ORDER — DILTIAZEM HCL 30 MG PO TABS
30.0000 mg | ORAL_TABLET | Freq: Once | ORAL | Status: AC
  Administered 2021-08-23: 30 mg via ORAL
  Filled 2021-08-23: qty 1

## 2021-08-23 MED ORDER — 0.9 % SODIUM CHLORIDE (POUR BTL) OPTIME
TOPICAL | Status: DC | PRN
  Administered 2021-08-23: 1000 mL

## 2021-08-23 MED ORDER — FENTANYL CITRATE (PF) 250 MCG/5ML IJ SOLN
INTRAMUSCULAR | Status: AC
  Filled 2021-08-23: qty 5

## 2021-08-23 MED ORDER — ONDANSETRON HCL 4 MG/2ML IJ SOLN: 4.0000 mg | Freq: Once | INTRAMUSCULAR | Status: DC | PRN

## 2021-08-23 MED ORDER — SODIUM CHLORIDE 0.9 % IV SOLN: INTRAVENOUS | Status: DC

## 2021-08-23 MED ORDER — VANCOMYCIN HCL IN DEXTROSE 1-5 GM/200ML-% IV SOLN
1000.0000 mg | INTRAVENOUS | Status: AC
  Administered 2021-08-23: 1000 mg via INTRAVENOUS
  Filled 2021-08-23: qty 200

## 2021-08-23 MED ORDER — FENTANYL CITRATE (PF) 100 MCG/2ML IJ SOLN
25.0000 ug | INTRAMUSCULAR | Status: DC | PRN
  Administered 2021-08-23: 25 ug via INTRAVENOUS
  Administered 2021-08-23: 50 ug via INTRAVENOUS

## 2021-08-23 MED ORDER — DOCUSATE SODIUM 100 MG PO CAPS
100.0000 mg | ORAL_CAPSULE | Freq: Two times a day (BID) | ORAL | Status: DC
  Administered 2021-08-23 – 2021-08-28 (×10): 100 mg via ORAL
  Filled 2021-08-23 (×10): qty 1

## 2021-08-23 MED ORDER — HYDROCODONE-ACETAMINOPHEN 7.5-325 MG PO TABS
1.0000 | ORAL_TABLET | ORAL | Status: DC | PRN
  Administered 2021-08-24 – 2021-08-27 (×7): 1 via ORAL
  Filled 2021-08-23 (×7): qty 1

## 2021-08-23 MED ORDER — PROPOFOL 10 MG/ML IV BOLUS
INTRAVENOUS | Status: DC | PRN
  Administered 2021-08-23: 100 mg via INTRAVENOUS

## 2021-08-23 MED ORDER — ONDANSETRON HCL 4 MG PO TABS: 4.0000 mg | ORAL_TABLET | Freq: Four times a day (QID) | ORAL | Status: DC | PRN

## 2021-08-23 MED ORDER — CHLORHEXIDINE GLUCONATE 0.12 % MT SOLN: 15.0000 mL | Freq: Once | OROMUCOSAL | Status: DC

## 2021-08-23 MED ORDER — FENTANYL CITRATE (PF) 100 MCG/2ML IJ SOLN
INTRAMUSCULAR | Status: AC
  Filled 2021-08-23: qty 2

## 2021-08-23 MED ORDER — PHENYLEPHRINE HCL-NACL 20-0.9 MG/250ML-% IV SOLN
INTRAVENOUS | Status: DC | PRN
Start: 2021-08-23 — End: 2021-08-23
  Administered 2021-08-23: 50 ug/min via INTRAVENOUS

## 2021-08-23 MED ORDER — POLYETHYLENE GLYCOL 3350 17 G PO PACK
17.0000 g | PACK | Freq: Two times a day (BID) | ORAL | Status: AC
  Administered 2021-08-23 – 2021-08-24 (×3): 17 g via ORAL
  Filled 2021-08-23 (×3): qty 1

## 2021-08-23 MED ORDER — ALUM & MAG HYDROXIDE-SIMETH 200-200-20 MG/5ML PO SUSP: 30.0000 mL | ORAL | Status: DC | PRN

## 2021-08-23 MED ORDER — METOCLOPRAMIDE HCL 5 MG/ML IJ SOLN: 5.0000 mg | Freq: Three times a day (TID) | INTRAMUSCULAR | Status: DC | PRN

## 2021-08-23 MED ORDER — ACETAMINOPHEN 10 MG/ML IV SOLN
INTRAVENOUS | Status: AC
  Filled 2021-08-23: qty 100

## 2021-08-23 MED ORDER — CHLORHEXIDINE GLUCONATE 0.12 % MT SOLN
OROMUCOSAL | Status: AC
  Administered 2021-08-23: 15 mL
  Filled 2021-08-23: qty 15

## 2021-08-23 MED ORDER — POLYETHYLENE GLYCOL 3350 17 G PO PACK
17.0000 g | PACK | Freq: Every day | ORAL | Status: DC | PRN
  Administered 2021-08-25 – 2021-08-28 (×3): 17 g via ORAL
  Filled 2021-08-23 (×4): qty 1

## 2021-08-23 MED ORDER — ACETAMINOPHEN 325 MG PO TABS
325.0000 mg | ORAL_TABLET | Freq: Four times a day (QID) | ORAL | Status: DC | PRN
  Administered 2021-08-24 – 2021-08-28 (×4): 650 mg via ORAL
  Filled 2021-08-23 (×4): qty 2

## 2021-08-23 MED ORDER — ONDANSETRON HCL 4 MG/2ML IJ SOLN
INTRAMUSCULAR | Status: DC | PRN
  Administered 2021-08-23: 4 mg via INTRAVENOUS

## 2021-08-23 MED ORDER — METHOCARBAMOL 1000 MG/10ML IJ SOLN
500.0000 mg | Freq: Four times a day (QID) | INTRAVENOUS | Status: DC | PRN
  Filled 2021-08-23: qty 5

## 2021-08-23 MED ORDER — TRANEXAMIC ACID-NACL 1000-0.7 MG/100ML-% IV SOLN
1000.0000 mg | INTRAVENOUS | Status: AC
  Administered 2021-08-23: 1000 mg via INTRAVENOUS
  Filled 2021-08-23: qty 100

## 2021-08-23 MED ORDER — ONDANSETRON HCL 4 MG/2ML IJ SOLN: 4.0000 mg | Freq: Four times a day (QID) | INTRAMUSCULAR | Status: DC | PRN

## 2021-08-23 MED ORDER — LACTATED RINGERS IV SOLN: INTRAVENOUS | Status: DC

## 2021-08-23 MED ORDER — MUPIROCIN 2 % EX OINT
TOPICAL_OINTMENT | CUTANEOUS | Status: AC
  Filled 2021-08-23: qty 22

## 2021-08-23 MED ORDER — FENTANYL CITRATE (PF) 250 MCG/5ML IJ SOLN
INTRAMUSCULAR | Status: DC | PRN
  Administered 2021-08-23: 50 ug via INTRAVENOUS
  Administered 2021-08-23 (×2): 25 ug via INTRAVENOUS

## 2021-08-23 MED ORDER — MENTHOL 3 MG MT LOZG: 1.0000 | LOZENGE | OROMUCOSAL | Status: DC | PRN

## 2021-08-23 MED ORDER — PHENOL 1.4 % MT LIQD: 1.0000 | OROMUCOSAL | Status: DC | PRN

## 2021-08-23 MED ORDER — ALBUMIN HUMAN 5 % IV SOLN: INTRAVENOUS | Status: DC | PRN

## 2021-08-23 MED ORDER — DILTIAZEM HCL 30 MG PO TABS
60.0000 mg | ORAL_TABLET | Freq: Four times a day (QID) | ORAL | Status: DC
  Administered 2021-08-23: 60 mg via ORAL
  Filled 2021-08-23 (×2): qty 2

## 2021-08-23 MED ORDER — TRANEXAMIC ACID-NACL 1000-0.7 MG/100ML-% IV SOLN
1000.0000 mg | Freq: Once | INTRAVENOUS | Status: AC
  Administered 2021-08-23: 1000 mg via INTRAVENOUS
  Filled 2021-08-23: qty 100

## 2021-08-23 MED ORDER — METHOCARBAMOL 500 MG PO TABS
500.0000 mg | ORAL_TABLET | Freq: Four times a day (QID) | ORAL | Status: DC | PRN
  Administered 2021-08-24 – 2021-08-28 (×5): 500 mg via ORAL
  Filled 2021-08-23 (×6): qty 1

## 2021-08-23 SURGICAL SUPPLY — 39 items
ADH SKN CLS APL DERMABOND .7 (GAUZE/BANDAGES/DRESSINGS) ×2
BAG COUNTER SPONGE SURGICOUNT (BAG) ×2 IMPLANT
BAG SPNG CNTER NS LX DISP (BAG) ×1
BIT DRILL CANN LG 4.3MM (BIT) IMPLANT
COVER PERINEAL POST (MISCELLANEOUS) ×2 IMPLANT
COVER SURGICAL LIGHT HANDLE (MISCELLANEOUS) ×2 IMPLANT
DERMABOND ADVANCED (GAUZE/BANDAGES/DRESSINGS) ×2
DERMABOND ADVANCED .7 DNX12 (GAUZE/BANDAGES/DRESSINGS) ×1 IMPLANT
DRAPE STERI IOBAN 125X83 (DRAPES) ×2 IMPLANT
DRILL BIT CANN LG 4.3MM (BIT) ×2
DRSG AQUACEL AG ADV 3.5X 6 (GAUZE/BANDAGES/DRESSINGS) ×2 IMPLANT
DRSG MEPILEX BORDER 4X12 (GAUZE/BANDAGES/DRESSINGS) ×2 IMPLANT
DURAPREP 26ML APPLICATOR (WOUND CARE) ×2 IMPLANT
ELECT REM PT RETURN 9FT ADLT (ELECTROSURGICAL) ×2
ELECTRODE REM PT RTRN 9FT ADLT (ELECTROSURGICAL) ×1 IMPLANT
FACESHIELD WRAPAROUND (MASK) IMPLANT
FACESHIELD WRAPAROUND OR TEAM (MASK) ×2 IMPLANT
GLOVE SURG ENC MOIS LTX SZ7.5 (GLOVE) ×2 IMPLANT
GLOVE SURG UNDER LTX SZ7.5 (GLOVE) ×2 IMPLANT
GOWN STRL REUS W/ TWL LRG LVL3 (GOWN DISPOSABLE) ×2 IMPLANT
GOWN STRL REUS W/TWL 2XL LVL3 (GOWN DISPOSABLE) ×2 IMPLANT
GOWN STRL REUS W/TWL LRG LVL3 (GOWN DISPOSABLE) ×4
GUIDEPIN VERSANAIL DSP 3.2X444 (ORTHOPEDIC DISPOSABLE SUPPLIES) ×1 IMPLANT
KIT TURNOVER KIT B (KITS) ×2 IMPLANT
MANIFOLD NEPTUNE II (INSTRUMENTS) ×1 IMPLANT
NAIL HIP FRACT 130D 11X180 (Screw) ×1 IMPLANT
NS IRRIG 1000ML POUR BTL (IV SOLUTION) ×2 IMPLANT
PACK GENERAL/GYN (CUSTOM PROCEDURE TRAY) ×2 IMPLANT
PAD ARMBOARD 7.5X6 YLW CONV (MISCELLANEOUS) ×5 IMPLANT
SCREW BONE CORTICAL 5.0X3 (Screw) ×1 IMPLANT
SCREW LAG 10.5MMX105MM HFN (Screw) ×1 IMPLANT
SUT MNCRL AB 4-0 PS2 18 (SUTURE) ×2 IMPLANT
SUT VIC AB 1 CT1 27 (SUTURE) ×2
SUT VIC AB 1 CT1 27XBRD ANBCTR (SUTURE) ×1 IMPLANT
SUT VIC AB 2-0 CT1 27 (SUTURE) ×4
SUT VIC AB 2-0 CT1 TAPERPNT 27 (SUTURE) ×1 IMPLANT
TOWEL GREEN STERILE (TOWEL DISPOSABLE) ×2 IMPLANT
TOWEL GREEN STERILE FF (TOWEL DISPOSABLE) ×2 IMPLANT
WATER STERILE IRR 1000ML POUR (IV SOLUTION) ×2 IMPLANT

## 2021-08-23 NOTE — Transfer of Care (Signed)
Immediate Anesthesia Transfer of Care Note  Patient: AKEYLAH HENDEL  Procedure(s) Performed: INTRAMEDULLARY (IM) NAIL INTERTROCHANTRIC (Right: Hip)  Patient Location: PACU  Anesthesia Type:General  Level of Consciousness: drowsy  Airway & Oxygen Therapy: Patient Spontanous Breathing  Post-op Assessment: Report given to RN and Post -op Vital signs reviewed and stable  Post vital signs: Reviewed and stable  Last Vitals:  Vitals Value Taken Time  BP 116/67 08/23/21 1833  Temp    Pulse 90 08/23/21 1836  Resp 21 08/23/21 1836  SpO2 91 % 08/23/21 1836  Vitals shown include unvalidated device data.  Last Pain:  Vitals:   08/23/21 1606  TempSrc:   PainSc: 0-No pain      Patients Stated Pain Goal: 2 (07/68/08 8110)  Complications: No notable events documented.

## 2021-08-23 NOTE — Brief Op Note (Signed)
08/22/2021 - 08/23/2021  4:48 PM  PATIENT:  Toma Deiters  86 y.o. female  PRE-OPERATIVE DIAGNOSIS:  Comminuted right intertrochanteric hip fracture  POST-OPERATIVE DIAGNOSIS:  Comminuted right intertrochanteric hip fracture  PROCEDURE:  Procedure(s): INTRAMEDULLARY (IM) NAIL INTERTROCHANTRIC (Right)  SURGEON:  Surgeon(s) and Role:    Paralee Cancel, MD - Primary  PHYSICIAN ASSISTANT: None  ANESTHESIA:   general  EBL:  50 cc  BLOOD ADMINISTERED:none  DRAINS: none   LOCAL MEDICATIONS USED:  NONE  SPECIMEN:  No Specimen  DISPOSITION OF SPECIMEN:  N/A  COUNTS:  YES  TOURNIQUET:  * No tourniquets in log *  DICTATION: .Other Dictation: Dictation Number [id number]  PLAN OF CARE: Admit to inpatient   PATIENT DISPOSITION:  PACU - hemodynamically stable.   Delay start of Pharmacological VTE agent (>24hrs) due to surgical blood loss or risk of bleeding: no

## 2021-08-23 NOTE — Anesthesia Postprocedure Evaluation (Signed)
Anesthesia Post Note  Patient: Kim [name]  Procedure(s) Performed: INTRAMEDULLARY (IM) NAIL INTERTROCHANTRIC (Right: Hip)     Patient location during evaluation: PACU Anesthesia Type: General Level of consciousness: awake Pain management: pain level controlled Vital Signs Assessment: post-procedure vital signs reviewed and stable Respiratory status: spontaneous breathing, nonlabored ventilation, respiratory function stable and patient connected to nasal cannula oxygen Cardiovascular status: blood pressure returned to baseline and stable Postop Assessment: no apparent nausea or vomiting Anesthetic complications: no   No notable events documented.  Last Vitals:  Vitals:   08/23/21 2000 08/23/21 2126  BP:  130/70  Pulse:  86  Resp:  16  Temp: 36.7 C 36.4 C  SpO2:  98%    Last Pain:  Vitals:   08/23/21 2126  TempSrc: Oral  PainSc:                  Karyl Kinnier Sheresa Cullop

## 2021-08-23 NOTE — Progress Notes (Signed)
[facility] for apixaban Indication:  fracture care post-operative  Allergies  Allergen Reactions   Penicillins Anaphylaxis and Swelling    FACIAL SWELLING PATIENT HAS HAD A PCN REACTION WITH IMMEDIATE RASH, FACIAL/TONGUE/THROAT SWELLING, SOB, OR LIGHTHEADEDNESS WITH HYPOTENSION:  #  #  YES  #  #  Has patient had a PCN reaction causing severe rash involving mucus membranes or skin necrosis: No Has patient had a PCN reaction that required hospitalization: No Has patient had a PCN reaction occurring within the last 10 years: No   Propoxyphene N-Acetaminophen Swelling    tongue swelling   Evolocumab Other (See Comments)    Myalgias, fatigue, some itching and burning on her feet,  Arthralgia (Joint Pain)   Levaquin [Levofloxacin] Hives   Statins Other (See Comments)    Joint pain   Cortisone Rash   Myrbetriq [Mirabegron] Rash    Rash on face   Pseudoephedrine Other (See Comments)    Facial flushing    Patient Measurements: Height: 5\' 8"  (172.7 cm) Weight: 67.9 kg (149 lb 11.1 oz) IBW/kg (Calculated) : 63.9  Vital Signs: Temp: 99.1 F (37.3 C) (11/20 0700) Temp Source: Oral (11/20 0700) BP: 122/72 (11/20 1606) Pulse Rate: 92 (11/20 1606)  Labs: Recent Labs    08/22/21 1515  HGB 11.0*  HCT 34.6*  PLT 171  CREATININE 1.48*    Estimated Creatinine Clearance: 28.5 mL/min (A) (by C-G formula based on SCr of 1.48 mg/dL (H)).   Medical History: Past Medical History:  Diagnosis Date   Allergic rhinitis    Allergy    Arthritis    hands   Breast cancer ([place])    Breast cancer of upper-outer quadrant of left female breast ([place]) 11/08/13   finished chemo and radiation 2015   CAD (coronary artery disease)    a. 12/2013 Cath/PCI: LM nl, LAD 20p, 47m, 30d, D1 30p, LCX 20p, 38m, OM1 20, Mo2 40p, RCA 30p, 34m (4.0x16 Promus Premier DES), 20d, RPL 30, RPDA 70p, 67m, EF 65-70%.   CHF (congestive heart failure) (HCC)    Diverticulosis    HOH  (hard of hearing)    bilateral, wears hearing aids   Hx of adenomatous colonic polyps 02/1999   Hyperlipidemia    Hypertension    Hypertrophic obstructive cardiomyopathy (HOCM) ([place])    Cardiologist is Dr. [name]   Hypothyroidism    Iron deficiency anemia due to chronic blood loss 01/07/2016   Low back pain    gets ESI from Dr. Rennis Harding   Malabsorption of iron 01/07/2016   Myocardial infarction ([place]) 12/2013   very mild-with first chemo -placed stent x1   Persistent atrial fibrillation with rapid ventricular response ([place]) 10/14/2018   Personal history of radiation therapy 2015   S/P radiation therapy 05/14/2014-06/26/2014   1) Left Breast  / 50 Gy in 25 fractions, 2) Left Supraclavicular fossa/ 47.5 Gy in 25 fractions, 3) Left Posterior Axillary boost / 4.825 Gy in 25 fractions, 4) Left Breast boost / 10 Gy in 5 fractions    Wears hearing aid    left and right     Medications:  Scheduled:   [MAR Hold] amiodarone  200 mg Oral Daily   chlorhexidine  15 mL Mouth/Throat Once   Or   mouth rinse  15 mL Mouth Rinse Once   [MAR Hold] diltiazem  60 mg Oral Q6H   [MAR Hold] exemestane  25 mg Oral Daily   [MAR Hold] ezetimibe  10  mg Oral Daily   [MAR Hold] levothyroxine  137 mcg Oral QAC breakfast   [MAR Hold] metoprolol succinate  25 mg Oral BID   [MAR Hold] mupirocin ointment  1 application Nasal BID   mupirocin ointment       [MAR Hold] polyethylene glycol  17 g Oral BID   povidone-iodine  2 application Topical Once   povidone-iodine  2 application Topical Once   [MAR Hold] senna  1 tablet Oral BID    Assessment: 43 yof on apixaban for hx Afib - presented s/p fall, now underwent IM nail intertrochantric for R hip fx.   Was on apixaban 5 mg BID given wt>60 kg and Scr<1.5 despite age>82.   Hgb 11, plt 171 yesterday on CBC. Restart PTA direct oral anticoagulant (DOAC) on POD1 pending AM hemoglobin being stable (threshold 9) and not requiring blood transfusion.   Goal of Therapy:   Monitor platelets by anticoagulation protocol: Yes   Plan:  F/u CBC and PRBC requirement in AM to see if okay to resume apixaban 5 mg BID   Antonietta Jewel, PharmD, [place] Pharmacist  Phone: [phone] 08/23/2021 5:01 PM  Please check AMION for all [place] phone numbers After 10:00 PM, call [place] [phone]

## 2021-08-23 NOTE — H&P (View-Only) (Signed)
Reason for Consult: right hip fracture Referring Physician: Charlynne Cousins, MD  Kim [name] is an 86 y.o. female.  HPI:  Kim [name] is a 86 y.o. female with medical history significant for CAD s/p DES to mid RCA, HCM, paroxysmal atrial fibrillation on Eliquis, mitral regurgitation, remote history of breast cancer s/p chemotherapy and radiation who presents s/p fall.   Patient was at the [facility] yesterday to do some shopping.  States she was holding her cane on the grocery cart when she lost her balance and fell on her right side.  She has history of knee replacement to the right knee although it is usually the left knee that gives her trouble.  Did not hit her head. She denies any dizziness or lightheadedness prior.  Currently just noting a lot of pain to her right hip but otherwise denies any chest pain, palpitations, shortness of breath.  She lives alone but has good family support    Past Medical History:  Diagnosis Date   Allergic rhinitis    Allergy    Arthritis    hands   Breast cancer ([place])    Breast cancer of upper-outer quadrant of left female breast ([place]) 11/08/13   finished chemo and radiation 2015   CAD (coronary artery disease)    a. 12/2013 Cath/PCI: LM nl, LAD 20p, 5m, 30d, D1 30p, LCX 20p, 77m, OM1 20, Mo2 40p, RCA 30p, 35m (4.0x16 Promus Premier DES), 20d, RPL 30, RPDA 70p, 70m, EF 65-70%.   CHF (congestive heart failure) (HCC)    Diverticulosis    HOH (hard of hearing)    bilateral, wears hearing aids   Hx of adenomatous colonic polyps 02/1999   Hyperlipidemia    Hypertension    Hypertrophic obstructive cardiomyopathy (HOCM) ([place])    Cardiologist is Dr. [name]   Hypothyroidism    Iron deficiency anemia due to chronic blood loss 01/07/2016   Low back pain    gets ESI from Dr. Rennis Harding   Malabsorption of iron 01/07/2016   Myocardial infarction ([place]) 12/2013   very mild-with first chemo -placed stent x1   Persistent atrial fibrillation with rapid  ventricular response ([place]) 10/14/2018   Personal history of radiation therapy 2015   S/P radiation therapy 05/14/2014-06/26/2014   1) Left Breast  / 50 Gy in 25 fractions, 2) Left Supraclavicular fossa/ 47.5 Gy in 25 fractions, 3) Left Posterior Axillary boost / 4.825 Gy in 25 fractions, 4) Left Breast boost / 10 Gy in 5 fractions    Wears hearing aid    left and right     Past Surgical History:  Procedure Laterality Date   ABDOMINAL HYSTERECTOMY     ANTERIOR AND POSTERIOR REPAIR N/A 01/17/2015   Procedure: CYSTOCELE REPAIR ;  Surgeon: Princess Bruins, MD;  Location: [facility];  Service: Gynecology;  Laterality: N/A;   BILATERAL SALPINGOOPHORECTOMY     see Laurin Coder NP for GYN exams   BREAST BIOPSY Left 10/18/2013   BREAST BIOPSY Left 10/31/2013   BREAST LUMPECTOMY Left 11/05/2013   BREAST LUMPECTOMY WITH NEEDLE LOCALIZATION AND AXILLARY LYMPH NODE DISSECTION Left 11/05/2013   Procedure: LEFT BREAST LUMPECTOMY WITH NEEDLE LOCALIZATION and axillary lymph Node Dissection;  Surgeon: Edward Jolly, MD;  Location: [place];  Service: General;  Laterality: Left;   BREAST SURGERY  11/2013   left lumpectomy   CARDIOVERSION N/A 10/17/2018   Procedure: CARDIOVERSION;  Surgeon: Larey Dresser, MD;  Location: [place];  Service: Cardiovascular;  Laterality: N/A;   CARDIOVERSION N/A 11/08/2018   Procedure: CARDIOVERSION;  Surgeon: Larey Dresser, MD;  Location: [facility] ENDOSCOPY;  Service: Cardiovascular;  Laterality: N/A;   CARDIOVERSION N/A 08/13/2020   Procedure: CARDIOVERSION;  Surgeon: Larey Dresser, MD;  Location: [facility] ENDOSCOPY;  Service: Cardiovascular;  Laterality: N/A;   CARDIOVERSION N/A 03/13/2021   Procedure: CARDIOVERSION;  Surgeon: Larey Dresser, MD;  Location: [facility] ENDOSCOPY;  Service: Cardiovascular;  Laterality: N/A;   CATARACT EXTRACTION W/ INTRAOCULAR LENS  IMPLANT, BILATERAL  2012   bilateral   COLONOSCOPY  11/04/2015   per Dr. Fuller Plan, adenomatous polyps, no  repeats planned    EYE SURGERY  11/22/2006   cataracts, bilateral, intraocular lens implant   JOINT REPLACEMENT     2019   LEFT HEART CATHETERIZATION WITH CORONARY ANGIOGRAM N/A 12/19/2013   Procedure: [place];  Surgeon: Burnell Blanks, MD;  Location: [facility] CATH LAB;  Service: Cardiovascular;  Laterality: N/A;   lumbar epidural steroid injection     lumber spinal stenosis   LUMBAR LAMINECTOMY/DECOMPRESSION MICRODISCECTOMY Right 03/01/2018   Procedure: RIGHT LUMBAR FOUR- LUMBAR FIVE LAMINECTOMY/MICRODISCECTOMY;  Surgeon: [name], MD;  Location: [place];  Service: Neurosurgery;  Laterality: Right;   MITRAL VALVE REPAIR N/A 12/06/2018   Procedure: MITRAL VALVE REPAIR;  Surgeon: Sherren Mocha, MD;  Location: [place] CV LAB;  Service: Cardiovascular;  Laterality: N/A;   POLYPECTOMY     PORTACATH PLACEMENT Right 12/11/2013   Procedure: INSERTION PORT-A-CATH;  Surgeon: Edward Jolly, MD;  Location: [place];  Service: General;  Laterality: Right;  Subclavian Vein;    RIGHT/LEFT HEART CATH AND CORONARY ANGIOGRAPHY N/A 11/03/2018   Procedure: RIGHT/LEFT HEART CATH AND CORONARY ANGIOGRAPHY;  Surgeon: Larey Dresser, MD;  Location: [place] CV LAB;  Service: Cardiovascular;  Laterality: N/A;   TEE WITHOUT CARDIOVERSION N/A 10/17/2018   Procedure: TRANSESOPHAGEAL ECHOCARDIOGRAM (TEE);  Surgeon: Larey Dresser, MD;  Location: [facility] ENDOSCOPY;  Service: Cardiovascular;  Laterality: N/A;   TEE WITHOUT CARDIOVERSION N/A 11/08/2018   Procedure: TRANSESOPHAGEAL ECHOCARDIOGRAM (TEE);  Surgeon: Larey Dresser, MD;  Location: [facility] ENDOSCOPY;  Service: Cardiovascular;  Laterality: N/A;   TOTAL KNEE ARTHROPLASTY Right 10/24/2017   Procedure: TOTAL RIGHT KNEE ARTHROPLASTY;  Surgeon: Gaynelle Arabian, MD;  Location: [facility];  Service: Orthopedics;  Laterality: Right;    Family History  Problem Relation Age of Onset   Alcohol abuse Father     Kidney failure Mother    Colon cancer Neg Hx    Rectal cancer Neg Hx    Stomach cancer Neg Hx    Colon polyps Neg Hx     Social History:  reports that she has never smoked. She has never used smokeless tobacco. She reports that she does not drink alcohol and does not use drugs.  Allergies:  Allergies  Allergen Reactions   Penicillins Anaphylaxis and Swelling    FACIAL SWELLING PATIENT HAS HAD A PCN REACTION WITH IMMEDIATE RASH, FACIAL/TONGUE/THROAT SWELLING, SOB, OR LIGHTHEADEDNESS WITH HYPOTENSION:  #  #  YES  #  #  Has patient had a PCN reaction causing severe rash involving mucus membranes or skin necrosis: No Has patient had a PCN reaction that required hospitalization: No Has patient had a PCN reaction occurring within the last 10 years: No   Propoxyphene N-Acetaminophen Swelling    tongue swelling   Evolocumab Other (See Comments)    Myalgias, fatigue, some itching and burning on her feet,  Arthralgia (  Joint Pain)   Levaquin [Levofloxacin] Hives   Statins Other (See Comments)    Joint pain   Cortisone Rash   Myrbetriq [Mirabegron] Rash    Rash on face   Pseudoephedrine Other (See Comments)    Facial flushing    Medications: I have reviewed the patient's current medications. Scheduled:  amiodarone  200 mg Oral Daily   diltiazem  120 mg Oral Daily   exemestane  25 mg Oral Daily   ezetimibe  10 mg Oral Daily   levothyroxine  137 mcg Oral QAC breakfast   metoprolol succinate  25 mg Oral BID   senna  1 tablet Oral BID    Results for orders placed or performed during the hospital encounter of 08/22/21 (from the past 24 hour(s))  Resp Panel by RT-PCR (Flu A&B, Covid) Nasopharyngeal Swab     Status: None   Collection Time: 08/22/21  2:58 PM   Specimen: Nasopharyngeal Swab; Nasopharyngeal(NP) swabs in vial transport medium  Result Value Ref Range   SARS Coronavirus 2 by RT PCR NEGATIVE NEGATIVE   Influenza A by PCR NEGATIVE NEGATIVE   Influenza B by PCR NEGATIVE  NEGATIVE  CBC     Status: Abnormal   Collection Time: 08/22/21  3:15 PM  Result Value Ref Range   WBC 6.9 4.0 - 10.5 K/uL   RBC 3.56 (L) 3.87 - 5.11 MIL/uL   Hemoglobin 11.0 (L) 12.0 - 15.0 g/dL   HCT 34.6 (L) 36.0 - 46.0 %   MCV 97.2 80.0 - 100.0 fL   MCH 30.9 26.0 - 34.0 pg   MCHC 31.8 30.0 - 36.0 g/dL   RDW 13.8 11.5 - 15.5 %   Platelets 171 150 - 400 K/uL   nRBC 0.0 0.0 - 0.2 %  Basic metabolic panel     Status: Abnormal   Collection Time: 08/22/21  3:15 PM  Result Value Ref Range   Sodium 136 135 - 145 mmol/L   Potassium 4.1 3.5 - 5.1 mmol/L   Chloride 103 98 - 111 mmol/L   CO2 23 22 - 32 mmol/L   Glucose, Bld 129 (H) 70 - 99 mg/dL   BUN 32 (H) 8 - 23 mg/dL   Creatinine, Ser 1.48 (H) 0.44 - 1.00 mg/dL   Calcium 8.6 (L) 8.9 - 10.3 mg/dL   GFR, Estimated 35 (L) >60 mL/min   Anion gap 10 5 - 15    X-ray: CLINICAL DATA:  Fall with right leg pain.   EXAM: PELVIS - 1-2 VIEW   COMPARISON:  None.   FINDINGS: Fracture of the right femoral neck as described on concurrent femur radiographs. Bony irregularity at the pubic symphysis favored to be degenerative. SI joints are open and symmetric. Nonobstructive bowel gas pattern.   IMPRESSION: 1. Fracture of the right femoral neck as described on concurrent femur radiographs.   2. There is bony irregularity at the pubic symphysis which could be due to degenerative change.     Electronically Signed   By: Audie Pinto M.D.  ROS: Constitutional: No Weight Change, No Fever ENT/Mouth: No sore throat, No Rhinorrhea Eyes: No Eye Pain, No Vision Changes Cardiovascular: No Chest Pain, no SOB Respiratory: No Cough, No Sputum, No Wheezing, no Dyspnea  Gastrointestinal: No Nausea, No Vomiting, No Diarrhea, No Constipation, No Pain Genitourinary: no Urinary Incontinence Musculoskeletal: No Arthralgias, No Myalgias - history of right TKR that she has done well with Skin: No Skin Lesions, No Pruritus, Neuro: no Weakness, No  Numbness Psych:  No Anxiety/Panic, No Depression, no decrease appetite Heme/Lymph: No Bruising, No Bleeding  Blood pressure 100/67, pulse 97, temperature 98.5 F (36.9 C), temperature source Oral, resp. rate 16, height 5\' 8"  (1.727 m), weight 67.9 kg, last menstrual period 10/19/1991, SpO2 96 %.  Physical Exam: Eyes: PERRL, lids and conjunctivae normal ENMT: Mucous membranes are moist.  Neck: normal, supple Respiratory: clear to auscultation bilaterally, no wheezing, no crackles. Normal respiratory effort.  Cardiovascular: Regular rate and rhythm, no murmurs / rubs / gallops. No extremity edema. 2+ pedal pulses.  Abdomen: no tenderness, no masses palpated.  Bowel sounds positive.  Musculoskeletal: RLE in Buck's traction, NVI Skin:areas of focal  bruising noted to upper and lower extremities Neurologic: CN 2-12 grossly intact. Sensation intact, DTR normal. Unable to lift right LE due to hip pain Psychiatric: Normal judgment and insight. Alert and oriented x 3. Normal mood.   Assessment/Plan: Comminuted right intertrochanteric hip fracture  Plan: I reviewed the injury and fracture pattern and the need for surgical management with her and her daughter She takes Eliquis Determining time to OR based on availability but to address her hip as safely as possible  Mauri Pole 08/23/2021, 7:33 AM

## 2021-08-23 NOTE — Progress Notes (Signed)
Informed and Blood consent has been obtained from pt with daughter at bedside. Pt's Surgical PCR/MRSA swab has also been collected and sent off to the lab at this time. Pt requests that we allow her to rest for a short while after last dose of Cardizem and Bactroban was given. Pt resting peacefully with daughter at bedside.

## 2021-08-23 NOTE — Anesthesia Preprocedure Evaluation (Addendum)
Anesthesia Evaluation  Patient identified by MRN, date of birth, ID band  Reviewed: Allergy & Precautions, NPO status , Patient's Chart, lab work & pertinent test results  Airway Mallampati: III  TM Distance: >3 FB Neck ROM: Full    Dental no notable dental hx.    Pulmonary neg pulmonary ROS,    Pulmonary exam normal breath sounds clear to auscultation       Cardiovascular hypertension, Pt. on home beta blockers + CAD, + Past MI and +CHF  Normal cardiovascular exam+ dysrhythmias Atrial Fibrillation  Rhythm:Regular Rate:Normal  ECHO: Left ventricular ejection fraction, by estimation, is 60 to 65%. The left ventricle has normal function. The left ventricle has no regional wall motion abnormalities. There is severe focal basal septal left ventricular hypertrophy. Left ventricular diastolic parameters are indeterminate. LV outflow tract gradient was not measured.  Right ventricular systolic function is normal. The right ventricular size is normal. There is mildly elevated pulmonary artery systolic pressure. The estimated right ventricular systolic pressure is 91.4 mmHg.  Left atrial size was severely dilated.  Status post Mitraclip x 2 with residual mild-moderate mitral regurgitation. Mean gradient 6 mmHg.  The aortic valve is tricuspid. Aortic valve regurgitation is not visualized. Mild aortic valve sclerosis is present, with no evidence of aortic valve stenosis.  The inferior vena cava is normal in size with greater than 50% respiratory variability, suggesting right atrial pressure of 3 mmHg.  The patient was in atrial fibrillation.   Neuro/Psych PSYCHIATRIC DISORDERS negative neurological ROS     GI/Hepatic negative GI ROS, Neg liver ROS,   Endo/Other  Hypothyroidism   Renal/GU negative Renal ROS     Musculoskeletal  (+) Arthritis , Low back pain   Abdominal   Peds  Hematology  (+) Blood dyscrasia, anemia ,   Anesthesia  Other Findings Right Hip fx  Reproductive/Obstetrics                            Anesthesia Physical Anesthesia Plan  ASA: 3  Anesthesia Plan: General   Post-op Pain Management:    Induction:   PONV Risk Score and Plan: 3 and Ondansetron, Dexamethasone and Treatment may vary due to age or medical condition  Airway Management Planned: LMA  Additional Equipment:   Intra-op Plan:   Post-operative Plan: Extubation in OR  Informed Consent: I have reviewed the patients History and Physical, chart, labs and discussed the procedure including the risks, benefits and alternatives for the proposed anesthesia with the patient or authorized representative who has indicated his/her understanding and acceptance.     Dental advisory given  Plan Discussed with: CRNA  Anesthesia Plan Comments:        Anesthesia Quick Evaluation

## 2021-08-23 NOTE — Interval H&P Note (Signed)
History and Physical Interval Note:  08/23/2021 4:20 PM  Kim [name]  has presented today for surgery, with the diagnosis of Right Hip fx.  The various methods of treatment have been discussed with the patient and family. After consideration of risks, benefits and other options for treatment, the patient has consented to  Procedure(s): INTRAMEDULLARY (IM) NAIL INTERTROCHANTRIC (Right) as a surgical intervention.  The patient's history has been reviewed, patient examined, no change in status, stable for surgery.  I have reviewed the patient's chart and labs.  Questions were answered to the patient's satisfaction.     Mauri Pole

## 2021-08-23 NOTE — Anesthesia Procedure Notes (Signed)
Procedure Name: LMA Insertion Date/Time: 08/23/2021 5:19 PM Performed by: Clearnce Sorrel, CRNA Pre-anesthesia Checklist: Patient identified, Emergency Drugs available, Suction available and Patient being monitored Patient Re-evaluated:Patient Re-evaluated prior to induction Oxygen Delivery Method: Circle System Utilized Preoxygenation: Pre-oxygenation with 100% oxygen Induction Type: IV induction Ventilation: Mask ventilation without difficulty LMA: LMA flexible inserted LMA Size: 4.0 Number of attempts: 1 Airway Equipment and Method: Bite block Placement Confirmation: positive ETCO2 and breath sounds checked- equal and bilateral Tube secured with: Tape Dental Injury: Teeth and Oropharynx as per pre-operative assessment

## 2021-08-23 NOTE — Progress Notes (Signed)
[facility] PROGRESS NOTE    Progress Note  Kim [name]  QMG:[id number] DOB: March 23, 1935 DOA: 08/22/2021 PCP: Sueanne Margarita, DO     Brief Narrative:   Kim [name] is an 86 y.o. female past medical history significant for CAD status post DES stent to the mid RCA, hokum, paroxysmal atrial fibrillation on Eliquis, mitral regurgitation, history of breast cancer status postchemotherapy and radiation comes in with a fall, in the ED was found to be in A. fib with RVR hemoglobin like around 11, sodium 130 potassium 4.1 creatinine 1.4 CT of the head was negative, x-ray of the hip showed displaced fracture of the right femoral neck Eliquis was held her last dose was on 08/22/2019 orthopedic surgery was consulted and recommended ORIF on Monday to await Eliquis washout.  Assessment/Plan:   Displaced right femoral Hip fracture : Orthopedic surgery was consulted recommended to hold Eliquis and await for washout for surgery on Monday. Narcotics for pain.  Start MiraLAX p.o. twice daily. Physical therapy has been consulted.  Preoperative clearance: Her Lyndel Safe perioperative risk for myocardial infarction or cardiac arrest is 0.4. She relates she is able to walk into stores for about an hour or 2 without getting short of breath chest pain or palpitations. Risk and benefits explained to the patient and family they would like to proceed with surgical intervention.  Paroxysmal atrial fibrillation with RVR: With multiple cardioversions in the past due to severe mitral regurg had a mitral clip placed in 2020. The mild increase in her heart rate was likely due adrenergic reaction due to hip fracture. CHA2DVASc score of 6, risk and benefits discussed with the family. On admission she was in RVR she was restarted on home dose of amiodarone, diltiazem and metoprolol, now ranging from 90-100 we will increase her oral diltiazem and place it Q 6 hrs for heart rate goal like around 80. Use metoprolol IV  as needed for heart rate greater than 100. She appears euvolemic on physical exam.  Chronic kidney disease stage IIIb: With a baseline creatinine to look around 1.2, will allow a diet placed n.p.o. after midnight.  CAD status post stent to the mid RCA: Currently asymptomatic continue statins.  Hypothyroidism: Continue Synthroid.  History of breast cancer status postchemotherapy and radiation, Continue current medication.   DVT prophylaxis: lovenox Family Communication:none Status is: Inpatient  Remains inpatient appropriate because: Acute right femoral neck fracture   Code Status:     Code Status Orders  (From admission, onward)           Start     Ordered   08/22/21 1905  Full code  Continuous        08/22/21 1905           Code Status History     Date Active Date Inactive Code Status Order ID Comments User Context   12/06/2018 1307 12/07/2018 1833 Full Code [id number]  Sherren Mocha, MD Inpatient   11/03/2018 0854 11/03/2018 1524 Full Code [id number]  Larey Dresser, MD Inpatient   10/14/2018 1636 10/18/2018 1706 Full Code [id number]  Reola Mosher Inpatient   03/01/2018 1045 03/02/2018 1414 Full Code [id number]  [name], MD Inpatient   10/24/2017 1113 10/26/2017 1502 Full Code [id number]  Gaynelle Arabian, MD Inpatient   01/17/2015 1254 01/18/2015 1416 Full Code [id number]  Princess Bruins, MD Inpatient   12/19/2013 1803 12/21/2013 1331 Full Code [id number]  Burnell Blanks, MD Inpatient   12/19/2013 1650 12/19/2013 1803 Full Code [id number]  [place], Peter M, MD Inpatient   11/05/2013 1858 11/06/2013 1109 Full Code [id number]  Excell Seltzer, MD Inpatient      Advance Directive Documentation    Flowsheet Row Most Recent Value  Type of Advance Directive Living will  Pre-existing out of facility DNR order (yellow form or pink MOST form) --  "MOST" Form in Place? --         IV Access:   Peripheral IV   Procedures and diagnostic studies:    DG Pelvis 1-2 Views  Result Date: 08/22/2021 CLINICAL DATA:  Fall with right leg pain. EXAM: PELVIS - 1-2 VIEW COMPARISON:  None. FINDINGS: Fracture of the right femoral neck as described on concurrent femur radiographs. Bony irregularity at the pubic symphysis favored to be degenerative. SI joints are open and symmetric. Nonobstructive bowel gas pattern. IMPRESSION: 1. Fracture of the right femoral neck as described on concurrent femur radiographs. 2. There is bony irregularity at the pubic symphysis which could be due to degenerative change. Electronically Signed   By: Audie Pinto M.D.   On: 08/22/2021 16:41   CT Head Wo Contrast  Result Date: 08/22/2021 CLINICAL DATA:  Trauma, fall EXAM: CT HEAD WITHOUT CONTRAST TECHNIQUE: Contiguous axial images were obtained from the base of the skull through the vertex without intravenous contrast. COMPARISON:  01/23/2021 FINDINGS: Brain: There are no signs of bleeding. There is prominence of third and both lateral ventricles. There is no shift of midline structures. Cortical sulci are prominent. There is decreased density in subcortical and periventricular white matter. Vascular: There are scattered arterial calcifications. Skull: Unremarkable. Sinuses/Orbits: Unremarkable. Other: None IMPRESSION: There are no signs of bleeding within the cranium. Central and cortical atrophy. Small-vessel disease. Electronically Signed   By: Elmer Picker M.D.   On: 08/22/2021 17:27   DG Femur Min 2 Views Right  Result Date: 08/22/2021 CLINICAL DATA:  Patient had a fall.  Pain in the right leg. EXAM: RIGHT FEMUR 2 VIEWS COMPARISON:  None. FINDINGS: There is a displaced fracture of the right femoral neck with separation of the lesser trochanter. No evidence of dislocation. There is bony irregularity at the pubic symphysis, likely degenerative. IMPRESSION: Displaced fracture of the right femoral neck. Electronically Signed   By: Audie Pinto M.D.   On:  08/22/2021 16:38     Medical Consultants:   None.   Subjective:    Kim [name]   Objective:    Vitals:   08/22/21 2145 08/22/21 2227 08/23/21 0503 08/23/21 0700  BP: (!) 129/91 126/87 100/67 102/63  Pulse: 100 (!) 101 97 95  Resp: (!) 22 18 16 18   Temp:  98.6 F (37 C) 98.5 F (36.9 C) 99.1 F (37.3 C)  TempSrc:  Oral Oral Oral  SpO2: 95% 98% 96% 96%  Weight:  67.9 kg    Height:  5\' 8"  (1.727 m)     SpO2: 96 %   Intake/Output Summary (Last 24 hours) at 08/23/2021 0837 Last data filed at 08/23/2021 0700 Gross per 24 hour  Intake 0 ml  Output --  Net 0 ml   Filed Weights   08/22/21 2227  Weight: 67.9 kg    Exam: General exam: In no acute distress. Respiratory system: Good air movement and clear to auscultation. Cardiovascular system: S1 & S2 heard, RRR. No JVD, murmurs, rubs, gallops or clicks.  Gastrointestinal system: Abdomen is nondistended, soft and nontender.  Central nervous system: Alert and oriented. No focal neurological deficits. Extremities: No pedal edema. Skin: No  rashes, lesions or ulcers Psychiatry: Judgement and insight appear normal. Mood & affect appropriate.    Data Reviewed:    Labs: Basic Metabolic Panel: Recent Labs  Lab 08/22/21 1515  NA 136  K 4.1  CL 103  CO2 23  GLUCOSE 129*  BUN 32*  CREATININE 1.48*  CALCIUM 8.6*   GFR Estimated Creatinine Clearance: 28.5 mL/min (A) (by C-G formula based on SCr of 1.48 mg/dL (H)). Liver Function Tests: No results for input(s): AST, ALT, ALKPHOS, BILITOT, PROT, ALBUMIN in the last 168 hours. No results for input(s): LIPASE, AMYLASE in the last 168 hours. No results for input(s): AMMONIA in the last 168 hours. Coagulation profile No results for input(s): INR, PROTIME in the last 168 hours. COVID-19 Labs  No results for input(s): DDIMER, FERRITIN, LDH, CRP in the last 72 hours.  Lab Results  Component Value Date   SARSCOV2NAA NEGATIVE 08/22/2021   SARSCOV2NAA NEGATIVE  08/11/2020   [place] NEGATIVE 12/08/2019   [place] Not Detected 10/03/2019    CBC: Recent Labs  Lab 08/22/21 1515  WBC 6.9  HGB 11.0*  HCT 34.6*  MCV 97.2  PLT 171   Cardiac Enzymes: No results for input(s): CKTOTAL, CKMB, CKMBINDEX, TROPONINI in the last 168 hours. BNP (last 3 results) No results for input(s): PROBNP in the last 8760 hours. CBG: No results for input(s): GLUCAP in the last 168 hours. D-Dimer: No results for input(s): DDIMER in the last 72 hours. Hgb A1c: No results for input(s): HGBA1C in the last 72 hours. Lipid Profile: No results for input(s): CHOL, HDL, LDLCALC, TRIG, CHOLHDL, LDLDIRECT in the last 72 hours. Thyroid function studies: No results for input(s): TSH, T4TOTAL, T3FREE, THYROIDAB in the last 72 hours.  Invalid input(s): FREET3 Anemia work up: No results for input(s): VITAMINB12, FOLATE, FERRITIN, TIBC, IRON, RETICCTPCT in the last 72 hours. Sepsis Labs: Recent Labs  Lab 08/22/21 1515  WBC 6.9   Microbiology Recent Results (from the past 240 hour(s))  Resp Panel by RT-PCR (Flu A&B, Covid) Nasopharyngeal Swab     Status: None   Collection Time: 08/22/21  2:58 PM   Specimen: Nasopharyngeal Swab; Nasopharyngeal(NP) swabs in vial transport medium  Result Value Ref Range Status   SARS Coronavirus 2 by RT PCR NEGATIVE NEGATIVE Final    Comment: (NOTE) SARS-CoV-2 target nucleic acids are NOT DETECTED.  The SARS-CoV-2 RNA is generally detectable in upper respiratory specimens during the acute phase of infection. The lowest concentration of SARS-CoV-2 viral copies this assay can detect is 138 copies/mL. A negative result does not preclude SARS-Cov-2 infection and should not be used as the sole basis for treatment or other patient management decisions. A negative result may occur with  improper specimen collection/handling, submission of specimen other than nasopharyngeal swab, presence of viral mutation(s) within the areas targeted  by this assay, and inadequate number of viral copies(<138 copies/mL). A negative result must be combined with clinical observations, patient history, and epidemiological information. The expected result is Negative.  Fact Sheet for Patients:  EntrepreneurPulse.com.au  Fact Sheet for Healthcare Providers:  IncredibleEmployment.be  This test is no t yet approved or cleared by the [place] FDA and  has been authorized for detection and/or diagnosis of SARS-CoV-2 by FDA under an Emergency Use Authorization (EUA). This EUA will remain  in effect (meaning this test can be used) for the duration of the COVID-19 declaration under Section 564(b)(1) of the Act, 21 U.S.C.section 360bbb-3(b)(1), unless the authorization is terminated  or revoked sooner.  Influenza A by PCR NEGATIVE NEGATIVE Final   Influenza B by PCR NEGATIVE NEGATIVE Final    Comment: (NOTE) The Xpert Xpress SARS-CoV-2/FLU/RSV plus assay is intended as an aid in the diagnosis of influenza from Nasopharyngeal swab specimens and should not be used as a sole basis for treatment. Nasal washings and aspirates are unacceptable for Xpert Xpress SARS-CoV-2/FLU/RSV testing.  Fact Sheet for Patients: EntrepreneurPulse.com.au  Fact Sheet for Healthcare Providers: IncredibleEmployment.be  This test is not yet approved or cleared by the [place] FDA and has been authorized for detection and/or diagnosis of SARS-CoV-2 by FDA under an Emergency Use Authorization (EUA). This EUA will remain in effect (meaning this test can be used) for the duration of the COVID-19 declaration under Section 564(b)(1) of the Act, 21 U.S.C. section 360bbb-3(b)(1), unless the authorization is terminated or revoked.  Performed at [facility], [place] [address] [place], [place] [address]      Medications:    amiodarone  200 mg Oral Daily   diltiazem  120 mg  Oral Daily   exemestane  25 mg Oral Daily   ezetimibe  10 mg Oral Daily   levothyroxine  137 mcg Oral QAC breakfast   metoprolol succinate  25 mg Oral BID   senna  1 tablet Oral BID   Continuous Infusions:    LOS: 1 day   Kim [name]  [facility]  08/23/2021, 8:37 AM

## 2021-08-23 NOTE — Plan of Care (Signed)

## 2021-08-23 NOTE — Plan of Care (Signed)

## 2021-08-23 NOTE — Op Note (Signed)
NAME: Kim [name], HANSELL MEDICAL RECORD NO: [id number] ACCOUNT NO: [id number] DATE OF BIRTH: 1934-12-26 FACILITY: [facility] LOCATION: [facility] PHYSICIAN: Pietro Cassis. Alvan Dame, MD  Operative Report   DATE OF PROCEDURE: 08/23/2021  PREOPERATIVE DIAGNOSIS:  Comminuted right intertrochanteric hip fracture.  POSTOPERATIVE DIAGNOSIS:  Comminuted right intertrochanteric hip fracture.  PROCEDURE:  Closed reduction, intramedullary nailing of right hip fracture utilizing a Biomet Affixus nail 11 x 180 mm with a 130-degree lag screw measuring 105 mm with a single distal interlock in static position.  SURGEON:  Pietro Cassis. Alvan Dame, MD  ASSISTANT:  Surgical team.  ANESTHESIA:  General.  BLOOD LOSS:  50 mL  DRAINS:  None.  COMPLICATIONS:  None.  INDICATIONS FOR PROCEDURE:  The patient is an 86 year old female who was shopping at [facility] when she lost balance try to get her cart and fell onto her right hip.  She had immediate onset of pain.  This required transfer to the emergency room.   Radiographs revealed a comminuted intertrochanteric femur fracture.  Orthopedics was consulted for management.  She was seen and evaluated and discussed with her daughter the risks and benefits of the procedure.  The obvious benefit is for management of  the fracture and trying to get the fracture to heal.  Risks would include infection, bleeding, nonunion, malunion, and need for future surgeries.  We reviewed the postoperative course and expectations following fixation of an intertrochanteric femur  fracture.  Consent was obtained for the benefit of pain relief.  DESCRIPTION OF PROCEDURE:  The patient was brought to the operative theater.  Once adequate anesthesia, preoperative antibiotics, 1 gram of vancomycin related to significant PENICILLIN allergy, she was positioned supine.  Once she was carefully  positioned and padded against the perineal post with all bony prominences protected, her left leg was flexed and abducted out of  the way with a leg holder with the lateral aspect of her leg protected with a pad.  Her right foot was placed in the traction  boot.  Once positioned, fluoroscopy was brought to the field and we confirmed near anatomic reduction at least along the medial calcar getting this lined up nicely; however, there was significant comminution noted proximal to this around the greater  trochanteric segment.  Given these findings, we then prepped and draped the lateral aspect of her right hip with a shower curtain technique.  A timeout was performed identifying the patient, planned procedure, and extremity.  Fluoroscopy was brought back  to the field.  Landmarks were identified.  An incision was made proximal to the trochanter and the incision was taken down through the gluteal fascia.  A guidewire was then attempted to be inserted into the most prominent aspect of her greater  trochanter, which had displaced somewhat medial.  I was able to capture this.  I got the guidewire into the proximal femur, then reamed and drilled opening of the proximal femur.  I then was able to pass an 11 x 180 mm nail by hand to its appropriate  depth.  Then, using the insertion jig, the guidewire was inserted into the center of the femoral head slightly posterior related to her bone as well as slightly inferior.  This would provide some protective benefit for collapse.  I then measured and  selected a 105 mm lag screw.  I then drilled and passed the lag screw.  Once the lag screw was seated into the subchondral bone of the femoral head, I took the traction off the lower extremity and used  the compression wheel to medialize the shaft to the  fracture.  Once this was done, the proximal locking bolt was locked and then backed quarter of a turn off to allow for further compression.  At this point, a distal interlock was placed through the insertion jig.  Once this was done, the insertion jig  was removed.  Final radiographs were obtained in  the AP and lateral planes.  All wounds were irrigated.  The proximal wound was closed in layers with #1 Vicryl in the gluteal fascia, then 2-0 Vicryl and a running Monocryl stitch.  The distal two  incisions were closed with 2-0 Vicryl.  All wounds were cleaned, dried and dressed sterilely using surgical glue and Aquacel dressings.  She was then extubated and brought to the recovery room in stable condition, tolerated the procedure well.  Postoperatively, I will have her be partial weightbearing for 4-6 weeks to allow for bone healing.  We will see her back in the office in 2 weeks following discharge.  She does live alone, so arrangements will need to be made postoperatively whether or  not she will be able to go home wiht full-time care or in a nursing facility.   VAI D: 08/23/2021 6:35:48 pm T: 08/23/2021 8:24:00 pm  JOB: [id number]/ [id number]

## 2021-08-23 NOTE — Consult Note (Signed)
Reason for Consult: right hip fracture Referring Physician: Charlynne Cousins, MD  Kim [name] is an 86 y.o. female.  HPI:  Kim [name] is a 86 y.o. female with medical history significant for CAD s/p DES to mid RCA, HCM, paroxysmal atrial fibrillation on Eliquis, mitral regurgitation, remote history of breast cancer s/p chemotherapy and radiation who presents s/p fall.   Patient was at the [facility] yesterday to do some shopping.  States she was holding her cane on the grocery cart when she lost her balance and fell on her right side.  She has history of knee replacement to the right knee although it is usually the left knee that gives her trouble.  Did not hit her head. She denies any dizziness or lightheadedness prior.  Currently just noting a lot of pain to her right hip but otherwise denies any chest pain, palpitations, shortness of breath.  She lives alone but has good family support    Past Medical History:  Diagnosis Date   Allergic rhinitis    Allergy    Arthritis    hands   Breast cancer ([place])    Breast cancer of upper-outer quadrant of left female breast ([place]) 11/08/13   finished chemo and radiation 2015   CAD (coronary artery disease)    a. 12/2013 Cath/PCI: LM nl, LAD 20p, 33m, 30d, D1 30p, LCX 20p, 46m, OM1 20, Mo2 40p, RCA 30p, 70m (4.0x16 Promus Premier DES), 20d, RPL 30, RPDA 70p, 32m, EF 65-70%.   CHF (congestive heart failure) (HCC)    Diverticulosis    HOH (hard of hearing)    bilateral, wears hearing aids   Hx of adenomatous colonic polyps 02/1999   Hyperlipidemia    Hypertension    Hypertrophic obstructive cardiomyopathy (HOCM) ([place])    Cardiologist is Dr. [name]   Hypothyroidism    Iron deficiency anemia due to chronic blood loss 01/07/2016   Low back pain    gets ESI from Dr. Rennis Harding   Malabsorption of iron 01/07/2016   Myocardial infarction ([place]) 12/2013   very mild-with first chemo -placed stent x1   Persistent atrial fibrillation with rapid  ventricular response ([place]) 10/14/2018   Personal history of radiation therapy 2015   S/P radiation therapy 05/14/2014-06/26/2014   1) Left Breast  / 50 Gy in 25 fractions, 2) Left Supraclavicular fossa/ 47.5 Gy in 25 fractions, 3) Left Posterior Axillary boost / 4.825 Gy in 25 fractions, 4) Left Breast boost / 10 Gy in 5 fractions    Wears hearing aid    left and right     Past Surgical History:  Procedure Laterality Date   ABDOMINAL HYSTERECTOMY     ANTERIOR AND POSTERIOR REPAIR N/A 01/17/2015   Procedure: CYSTOCELE REPAIR ;  Surgeon: Princess Bruins, MD;  Location: [facility];  Service: Gynecology;  Laterality: N/A;   BILATERAL SALPINGOOPHORECTOMY     see Laurin Coder NP for GYN exams   BREAST BIOPSY Left 10/18/2013   BREAST BIOPSY Left 10/31/2013   BREAST LUMPECTOMY Left 11/05/2013   BREAST LUMPECTOMY WITH NEEDLE LOCALIZATION AND AXILLARY LYMPH NODE DISSECTION Left 11/05/2013   Procedure: LEFT BREAST LUMPECTOMY WITH NEEDLE LOCALIZATION and axillary lymph Node Dissection;  Surgeon: Edward Jolly, MD;  Location: [place];  Service: General;  Laterality: Left;   BREAST SURGERY  11/2013   left lumpectomy   CARDIOVERSION N/A 10/17/2018   Procedure: CARDIOVERSION;  Surgeon: Larey Dresser, MD;  Location: [place];  Service: Cardiovascular;  Laterality: N/A;   CARDIOVERSION N/A 11/08/2018   Procedure: CARDIOVERSION;  Surgeon: Larey Dresser, MD;  Location: [facility] ENDOSCOPY;  Service: Cardiovascular;  Laterality: N/A;   CARDIOVERSION N/A 08/13/2020   Procedure: CARDIOVERSION;  Surgeon: Larey Dresser, MD;  Location: [facility] ENDOSCOPY;  Service: Cardiovascular;  Laterality: N/A;   CARDIOVERSION N/A 03/13/2021   Procedure: CARDIOVERSION;  Surgeon: Larey Dresser, MD;  Location: [facility] ENDOSCOPY;  Service: Cardiovascular;  Laterality: N/A;   CATARACT EXTRACTION W/ INTRAOCULAR LENS  IMPLANT, BILATERAL  2012   bilateral   COLONOSCOPY  11/04/2015   per Dr. Fuller Plan, adenomatous polyps, no  repeats planned    EYE SURGERY  11/22/2006   cataracts, bilateral, intraocular lens implant   JOINT REPLACEMENT     2019   LEFT HEART CATHETERIZATION WITH CORONARY ANGIOGRAM N/A 12/19/2013   Procedure: [place];  Surgeon: Burnell Blanks, MD;  Location: [facility] CATH LAB;  Service: Cardiovascular;  Laterality: N/A;   lumbar epidural steroid injection     lumber spinal stenosis   LUMBAR LAMINECTOMY/DECOMPRESSION MICRODISCECTOMY Right 03/01/2018   Procedure: RIGHT LUMBAR FOUR- LUMBAR FIVE LAMINECTOMY/MICRODISCECTOMY;  Surgeon: [name], MD;  Location: [place];  Service: Neurosurgery;  Laterality: Right;   MITRAL VALVE REPAIR N/A 12/06/2018   Procedure: MITRAL VALVE REPAIR;  Surgeon: Sherren Mocha, MD;  Location: [place] CV LAB;  Service: Cardiovascular;  Laterality: N/A;   POLYPECTOMY     PORTACATH PLACEMENT Right 12/11/2013   Procedure: INSERTION PORT-A-CATH;  Surgeon: Edward Jolly, MD;  Location: [place];  Service: General;  Laterality: Right;  Subclavian Vein;    RIGHT/LEFT HEART CATH AND CORONARY ANGIOGRAPHY N/A 11/03/2018   Procedure: RIGHT/LEFT HEART CATH AND CORONARY ANGIOGRAPHY;  Surgeon: Larey Dresser, MD;  Location: [place] CV LAB;  Service: Cardiovascular;  Laterality: N/A;   TEE WITHOUT CARDIOVERSION N/A 10/17/2018   Procedure: TRANSESOPHAGEAL ECHOCARDIOGRAM (TEE);  Surgeon: Larey Dresser, MD;  Location: [facility] ENDOSCOPY;  Service: Cardiovascular;  Laterality: N/A;   TEE WITHOUT CARDIOVERSION N/A 11/08/2018   Procedure: TRANSESOPHAGEAL ECHOCARDIOGRAM (TEE);  Surgeon: Larey Dresser, MD;  Location: [facility] ENDOSCOPY;  Service: Cardiovascular;  Laterality: N/A;   TOTAL KNEE ARTHROPLASTY Right 10/24/2017   Procedure: TOTAL RIGHT KNEE ARTHROPLASTY;  Surgeon: Gaynelle Arabian, MD;  Location: [facility];  Service: Orthopedics;  Laterality: Right;    Family History  Problem Relation Age of Onset   Alcohol abuse Father     Kidney failure Mother    Colon cancer Neg Hx    Rectal cancer Neg Hx    Stomach cancer Neg Hx    Colon polyps Neg Hx     Social History:  reports that she has never smoked. She has never used smokeless tobacco. She reports that she does not drink alcohol and does not use drugs.  Allergies:  Allergies  Allergen Reactions   Penicillins Anaphylaxis and Swelling    FACIAL SWELLING PATIENT HAS HAD A PCN REACTION WITH IMMEDIATE RASH, FACIAL/TONGUE/THROAT SWELLING, SOB, OR LIGHTHEADEDNESS WITH HYPOTENSION:  #  #  YES  #  #  Has patient had a PCN reaction causing severe rash involving mucus membranes or skin necrosis: No Has patient had a PCN reaction that required hospitalization: No Has patient had a PCN reaction occurring within the last 10 years: No   Propoxyphene N-Acetaminophen Swelling    tongue swelling   Evolocumab Other (See Comments)    Myalgias, fatigue, some itching and burning on her feet,  Arthralgia (  Joint Pain)   Levaquin [Levofloxacin] Hives   Statins Other (See Comments)    Joint pain   Cortisone Rash   Myrbetriq [Mirabegron] Rash    Rash on face   Pseudoephedrine Other (See Comments)    Facial flushing    Medications: I have reviewed the patient's current medications. Scheduled:  amiodarone  200 mg Oral Daily   diltiazem  120 mg Oral Daily   exemestane  25 mg Oral Daily   ezetimibe  10 mg Oral Daily   levothyroxine  137 mcg Oral QAC breakfast   metoprolol succinate  25 mg Oral BID   senna  1 tablet Oral BID    Results for orders placed or performed during the hospital encounter of 08/22/21 (from the past 24 hour(s))  Resp Panel by RT-PCR (Flu A&B, Covid) Nasopharyngeal Swab     Status: None   Collection Time: 08/22/21  2:58 PM   Specimen: Nasopharyngeal Swab; Nasopharyngeal(NP) swabs in vial transport medium  Result Value Ref Range   SARS Coronavirus 2 by RT PCR NEGATIVE NEGATIVE   Influenza A by PCR NEGATIVE NEGATIVE   Influenza B by PCR NEGATIVE  NEGATIVE  CBC     Status: Abnormal   Collection Time: 08/22/21  3:15 PM  Result Value Ref Range   WBC 6.9 4.0 - 10.5 K/uL   RBC 3.56 (L) 3.87 - 5.11 MIL/uL   Hemoglobin 11.0 (L) 12.0 - 15.0 g/dL   HCT 34.6 (L) 36.0 - 46.0 %   MCV 97.2 80.0 - 100.0 fL   MCH 30.9 26.0 - 34.0 pg   MCHC 31.8 30.0 - 36.0 g/dL   RDW 13.8 11.5 - 15.5 %   Platelets 171 150 - 400 K/uL   nRBC 0.0 0.0 - 0.2 %  Basic metabolic panel     Status: Abnormal   Collection Time: 08/22/21  3:15 PM  Result Value Ref Range   Sodium 136 135 - 145 mmol/L   Potassium 4.1 3.5 - 5.1 mmol/L   Chloride 103 98 - 111 mmol/L   CO2 23 22 - 32 mmol/L   Glucose, Bld 129 (H) 70 - 99 mg/dL   BUN 32 (H) 8 - 23 mg/dL   Creatinine, Ser 1.48 (H) 0.44 - 1.00 mg/dL   Calcium 8.6 (L) 8.9 - 10.3 mg/dL   GFR, Estimated 35 (L) >60 mL/min   Anion gap 10 5 - 15    X-ray: CLINICAL DATA:  Fall with right leg pain.   EXAM: PELVIS - 1-2 VIEW   COMPARISON:  None.   FINDINGS: Fracture of the right femoral neck as described on concurrent femur radiographs. Bony irregularity at the pubic symphysis favored to be degenerative. SI joints are open and symmetric. Nonobstructive bowel gas pattern.   IMPRESSION: 1. Fracture of the right femoral neck as described on concurrent femur radiographs.   2. There is bony irregularity at the pubic symphysis which could be due to degenerative change.     Electronically Signed   By: Audie Pinto M.D.  ROS: Constitutional: No Weight Change, No Fever ENT/Mouth: No sore throat, No Rhinorrhea Eyes: No Eye Pain, No Vision Changes Cardiovascular: No Chest Pain, no SOB Respiratory: No Cough, No Sputum, No Wheezing, no Dyspnea  Gastrointestinal: No Nausea, No Vomiting, No Diarrhea, No Constipation, No Pain Genitourinary: no Urinary Incontinence Musculoskeletal: No Arthralgias, No Myalgias - history of right TKR that she has done well with Skin: No Skin Lesions, No Pruritus, Neuro: no Weakness, No  Numbness Psych:  No Anxiety/Panic, No Depression, no decrease appetite Heme/Lymph: No Bruising, No Bleeding  Blood pressure 100/67, pulse 97, temperature 98.5 F (36.9 C), temperature source Oral, resp. rate 16, height 5\' 8"  (1.727 m), weight 67.9 kg, last menstrual period 10/19/1991, SpO2 96 %.  Physical Exam: Eyes: PERRL, lids and conjunctivae normal ENMT: Mucous membranes are moist.  Neck: normal, supple Respiratory: clear to auscultation bilaterally, no wheezing, no crackles. Normal respiratory effort.  Cardiovascular: Regular rate and rhythm, no murmurs / rubs / gallops. No extremity edema. 2+ pedal pulses.  Abdomen: no tenderness, no masses palpated.  Bowel sounds positive.  Musculoskeletal: RLE in Buck's traction, NVI Skin:areas of focal  bruising noted to upper and lower extremities Neurologic: CN 2-12 grossly intact. Sensation intact, DTR normal. Unable to lift right LE due to hip pain Psychiatric: Normal judgment and insight. Alert and oriented x 3. Normal mood.   Assessment/Plan: Comminuted right intertrochanteric hip fracture  Plan: I reviewed the injury and fracture pattern and the need for surgical management with her and her daughter She takes Eliquis Determining time to OR based on availability but to address her hip as safely as possible  Mauri Pole 08/23/2021, 7:33 AM

## 2021-08-24 DIAGNOSIS — S72001A Fracture of unspecified part of neck of right femur, initial encounter for closed fracture: Secondary | ICD-10-CM | POA: Diagnosis not present

## 2021-08-24 DIAGNOSIS — I480 Paroxysmal atrial fibrillation: Secondary | ICD-10-CM | POA: Diagnosis not present

## 2021-08-24 LAB — CBC
HCT: 26.5 % — ABNORMAL LOW (ref 36.0–46.0)
Hemoglobin: 7.9 g/dL — ABNORMAL LOW (ref 12.0–15.0)
MCH: 30.7 pg (ref 26.0–34.0)
MCHC: 29.8 g/dL — ABNORMAL LOW (ref 30.0–36.0)
MCV: 103.1 fL — ABNORMAL HIGH (ref 80.0–100.0)
Platelets: 120 10*3/uL — ABNORMAL LOW (ref 150–400)
RBC: 2.57 MIL/uL — ABNORMAL LOW (ref 3.87–5.11)
RDW: 13.9 % (ref 11.5–15.5)
WBC: 7.5 10*3/uL (ref 4.0–10.5)
nRBC: 0 % (ref 0.0–0.2)

## 2021-08-24 LAB — BASIC METABOLIC PANEL
Anion gap: 11 (ref 5–15)
BUN: 21 mg/dL (ref 8–23)
CO2: 22 mmol/L (ref 22–32)
Calcium: 8.2 mg/dL — ABNORMAL LOW (ref 8.9–10.3)
Chloride: 99 mmol/L (ref 98–111)
Creatinine, Ser: 1.22 mg/dL — ABNORMAL HIGH (ref 0.44–1.00)
GFR, Estimated: 44 mL/min — ABNORMAL LOW (ref >60)
Glucose, Bld: 126 mg/dL — ABNORMAL HIGH (ref 70–99)
Potassium: 4.4 mmol/L (ref 3.5–5.1)
Sodium: 132 mmol/L — ABNORMAL LOW (ref 135–145)

## 2021-08-24 LAB — URINALYSIS, ROUTINE W REFLEX MICROSCOPIC
Bilirubin Urine: NEGATIVE
Glucose, UA: NEGATIVE mg/dL
Hgb urine dipstick: NEGATIVE
Ketones, ur: NEGATIVE mg/dL
Leukocytes,Ua: NEGATIVE
Nitrite: NEGATIVE
Protein, ur: NEGATIVE mg/dL
Specific Gravity, Urine: 1.018 (ref 1.005–1.030)
pH: 5 (ref 5.0–8.0)

## 2021-08-24 MED ORDER — METHOCARBAMOL 500 MG PO TABS
500.0000 mg | ORAL_TABLET | Freq: Three times a day (TID) | ORAL | 0 refills | Status: DC | PRN
Start: 2021-08-24 — End: 2022-03-18

## 2021-08-24 MED ORDER — HYDROCODONE-ACETAMINOPHEN 5-325 MG PO TABS
1.0000 | ORAL_TABLET | Freq: Four times a day (QID) | ORAL | 0 refills | Status: DC | PRN
Start: 2021-08-24 — End: 2021-11-05

## 2021-08-24 MED ORDER — METOPROLOL SUCCINATE ER 25 MG PO TB24: 25.0000 mg | ORAL_TABLET | Freq: Two times a day (BID) | ORAL | Status: DC

## 2021-08-24 MED ORDER — DILTIAZEM HCL 30 MG PO TABS
30.0000 mg | ORAL_TABLET | Freq: Four times a day (QID) | ORAL | Status: DC
  Filled 2021-08-24: qty 1

## 2021-08-24 MED ORDER — SODIUM CHLORIDE 0.9 % IV BOLUS
500.0000 mL | Freq: Once | INTRAVENOUS | Status: AC
  Administered 2021-08-24: 500 mL via INTRAVENOUS

## 2021-08-24 MED ORDER — APIXABAN 5 MG PO TABS
5.0000 mg | ORAL_TABLET | Freq: Two times a day (BID) | ORAL | Status: DC
  Administered 2021-08-24 – 2021-08-25 (×3): 5 mg via ORAL
  Filled 2021-08-24 (×3): qty 1

## 2021-08-24 NOTE — Progress Notes (Signed)
Patient received from PACU on a bed. RN noted some confusion. Patient consisently redirected and reoriented.Patient denies pain . Call bell is within reach. Will continue to monitor

## 2021-08-24 NOTE — Plan of Care (Signed)

## 2021-08-24 NOTE — Progress Notes (Signed)
Subjective: 1 Day Post-Op Procedure(s) (LRB): INTRAMEDULLARY (IM) NAIL INTERTROCHANTRIC (Right) Patient reports pain as moderate.   Patient seen in rounds for Dr. Alvan Dame. Patient is resting in bed this morning on exam. Her daughter, [name], is at the bedside. She is very hard of hearing. She reports last night was a difficult night. She reports pain in the right lower leg. Her daughter tells me she has had lumbar surgery as well as previous dose packs and ESI.  We will start therapy today.   Objective: Vital signs in last 24 hours: Temp:  [97.3 F (36.3 C)-98.1 F (36.7 C)] 97.5 F (36.4 C) (11/21 0459) Pulse Rate:  [71-116] 81 (11/21 0459) Resp:  [11-33] 16 (11/21 0459) BP: (99-139)/(48-97) 99/61 (11/21 0459) SpO2:  [78 %-100 %] 99 % (11/21 0459)  Intake/Output from previous day:  Intake/Output Summary (Last 24 hours) at 08/24/2021 0711 Last data filed at 08/24/2021 0349 Gross per 24 hour  Intake 1439.28 ml  Output 400 ml  Net 1039.28 ml     Intake/Output this shift: No intake/output data recorded.  Labs: Recent Labs    08/22/21 1515 08/24/21 0359  HGB 11.0* 7.9*   Recent Labs    08/22/21 1515 08/24/21 0359  WBC 6.9 7.5  RBC 3.56* 2.57*  HCT 34.6* 26.5*  PLT 171 120*   Recent Labs    08/22/21 1515 08/24/21 0359  NA 136 132*  K 4.1 4.4  CL 103 99  CO2 23 22  BUN 32* 21  CREATININE 1.48* 1.22*  GLUCOSE 129* 126*  CALCIUM 8.6* 8.2*   No results for input(s): LABPT, INR in the last 72 hours.  Exam: General - Patient is Alert and Appropriate Extremity - Neurologically intact Sensation intact distally Intact pulses distally Dorsiflexion/Plantar flexion intact Dressing - dressing C/D/I Motor Function - intact, moving foot and toes well on exam.   Past Medical History:  Diagnosis Date   Allergic rhinitis    Allergy    Arthritis    hands   Breast cancer ([place])    Breast cancer of upper-outer quadrant of left female breast ([place]) 11/08/13   finished  chemo and radiation 2015   CAD (coronary artery disease)    a. 12/2013 Cath/PCI: LM nl, LAD 20p, 42m, 30d, D1 30p, LCX 20p, 79m, OM1 20, Mo2 40p, RCA 30p, 56m (4.0x16 Promus Premier DES), 20d, RPL 30, RPDA 70p, 96m, EF 65-70%.   CHF (congestive heart failure) (HCC)    Diverticulosis    HOH (hard of hearing)    bilateral, wears hearing aids   Hx of adenomatous colonic polyps 02/1999   Hyperlipidemia    Hypertension    Hypertrophic obstructive cardiomyopathy (HOCM) ([place])    Cardiologist is Dr. [name]   Hypothyroidism    Iron deficiency anemia due to chronic blood loss 01/07/2016   Low back pain    gets ESI from Dr. Rennis Harding   Malabsorption of iron 01/07/2016   Myocardial infarction ([place]) 12/2013   very mild-with first chemo -placed stent x1   Persistent atrial fibrillation with rapid ventricular response ([place]) 10/14/2018   Personal history of radiation therapy 2015   S/P radiation therapy 05/14/2014-06/26/2014   1) Left Breast  / 50 Gy in 25 fractions, 2) Left Supraclavicular fossa/ 47.5 Gy in 25 fractions, 3) Left Posterior Axillary boost / 4.825 Gy in 25 fractions, 4) Left Breast boost / 10 Gy in 5 fractions    Wears hearing aid    left and right  Assessment/Plan: 1 Day Post-Op Procedure(s) (LRB): INTRAMEDULLARY (IM) NAIL INTERTROCHANTRIC (Right) Principal Problem:   Hip fracture (HCC) Active Problems:   Hypothyroidism   CAD (coronary artery disease)   HOCM (hypertrophic obstructive cardiomyopathy) (HCC)   Paroxysmal atrial fibrillation with rapid ventricular response (HCC)   History of breast cancer  Estimated body mass index is 22.76 kg/m as calculated from the following:   Height as of this encounter: 5\' 8"  (1.727 m).   Weight as of this encounter: 67.9 kg. Advance diet Up with therapy  DVT Prophylaxis -  Eliquis  (will restart today, POD #1) PWB 50% RLE  ABLA: Hgb stable at 7.9 this AM  Plan is to go Skilled nursing facility after hospital stay. Her daughter  prefers [facility] if able. Please communicate with her daughter, [name], when figuring out SNF placement. She may be at work, but able to call her.   Plan to get up with PT today. She will need to practice PWB status. If her pain is uncontrolled, I we may change medications as needed, but may just have been a difficult first post op night.    Griffith Citron, PA-C Orthopedic Surgery [phone] 08/24/2021, 7:11 AM

## 2021-08-24 NOTE — Evaluation (Addendum)
Occupational Therapy Evaluation Patient Details Name: Kim [name] MRN: [id number] DOB: [birth year] Today's Date: 08/24/2021   History of Present Illness Pt is 86 y.o. female presenting to ED after fall on 11/19. found to have R intertrochanteric hip fracture. S/p closed reduction IM nail on 11/20. PMH includes HTN, HOH, CAD, breast cancer, R TKA, and mitral valve repair.   Clinical Impression   Pt independent with ADLs at baseline, receives supervision from family for bathing and family completes IADLs/household tasks. Pt uses rollator for mobility at baseline, but also has SPC. Pt min-max A for ADLs during session, mod A for bed mobility and min A for transfers. Pt requires increased verbal cuing due to [facility] and dizziness/nausea during session. Pt BP taken in supine after attempted sit to stand transfer (97/62) and after supine in bed for a few minutes (87/58) nurse notified. Educated pt on WB precautions, pt requires reminders throughout session but verbalizes and demonstrates understanding. Pt limited by pain, dizziness, decreased activity tolerance, poor balance, decreased strength, ROM, and safety awareness at this time. Will continue to follow acutely, recommend d/c to SNF pending progress.      Recommendations for follow up therapy are one component of a multi-disciplinary discharge planning process, led by the attending physician.  Recommendations may be updated based on patient status, additional functional criteria and insurance authorization.   Follow Up Recommendations  Skilled nursing-short term rehab (<3 hours/day)    Assistance Recommended at Discharge Frequent or constant Supervision/Assistance  Functional Status Assessment  Patient has had a recent decline in their functional status and demonstrates the ability to make significant improvements in function in a reasonable and predictable amount of time.  Equipment Recommendations  Other (comment) (pt has DME)     Recommendations for Other Services PT consult     Precautions / Restrictions Precautions Precautions: Fall Restrictions Weight Bearing Restrictions: Yes RLE Weight Bearing: Partial weight bearing RLE Partial Weight Bearing Percentage or Pounds: 50      Mobility Bed Mobility Overal bed mobility: Needs Assistance Bed Mobility: Sit to Supine;Supine to Sit     Supine to sit: Mod assist;HOB elevated Sit to supine: Mod assist;HOB elevated   General bed mobility comments: requires assistance to bring LE's onto the floor to sit EOB, x2 to move up in bed    Transfers Overall transfer level: Needs assistance Equipment used: Rolling walker (2 wheels) Transfers: Sit to/from Stand Sit to Stand: Min assist;From elevated surface           General transfer comment: STS x1 attempt, pt required v/cing for hand placement      Balance Overall balance assessment: Needs assistance Sitting-balance support: Feet supported Sitting balance-Leahy Scale: Fair     Standing balance support: During functional activity;Reliant on assistive device for balance Standing balance-Leahy Scale: Poor                             ADL either performed or assessed with clinical judgement   ADL Overall ADL's : Needs assistance/impaired Eating/Feeding: Set up;Bed level   Grooming: Set up;Sitting;Bed level   Upper Body Bathing: Sitting;Minimal assistance   Lower Body Bathing: Maximal assistance;Sitting/lateral leans   Upper Body Dressing : Minimal assistance;Sitting   Lower Body Dressing: Maximal assistance;Sitting/lateral leans   Toilet Transfer: BSC/3in1;Maximal assistance   Toileting- Clothing Manipulation and Hygiene: Maximal assistance;Sitting/lateral lean         General ADL Comments: pt drowsiness and pain limiting performance  Vision   Vision Assessment?: No apparent visual deficits     Perception     Praxis      Pertinent Vitals/Pain Pain Assessment:  Faces Pain Score: 8  Faces Pain Scale: Hurts whole lot Pain Location: RLE Pain Descriptors / Indicators: Aching;Constant;Discomfort Pain Intervention(s): Limited activity within patient's tolerance;Monitored during session;Repositioned;Premedicated before session     Hand Dominance Right   Extremity/Trunk Assessment Upper Extremity Assessment Upper Extremity Assessment: Generalized weakness;Overall [facility] for tasks assessed   Lower Extremity Assessment Lower Extremity Assessment: Defer to PT evaluation   Cervical / Trunk Assessment Cervical / Trunk Assessment: Normal   Communication Communication Communication: HOH   Cognition Arousal/Alertness: Suspect due to medications Behavior During Therapy: WFL for tasks assessed/performed Overall Cognitive Status: Impaired/Different from baseline Area of Impairment: Memory;Attention;Following commands                   Current Attention Level: Focused Memory: Decreased recall of precautions Following Commands: Follows one step commands with increased time       General Comments: requires continued cues, suspect due to drowsiness from medications and HOH     General Comments  daughter present during session, pt with soft BP during session, (97/62) and (87/58) both taken in supine after attempted transfers and sitting EOB    Exercises     Shoulder Instructions      Home Living Family/patient expects to be discharged to:: Private residence Living Arrangements: Alone Available Help at Discharge: Family (daughter is caregiver, checks on pt morning, noon and night) Type of Home: House Home Access: Level entry     Home Layout: One level     Bathroom Shower/Tub: Occupational psychologist: Handicapped height     Home Equipment: Advice worker (2 wheels);Cane - single point;BSC/3in1          Prior Functioning/Environment Prior Level of Function : Needs assist (needs assistance with IADLs and  medication management, present for bathing, however does not assist.)             Mobility Comments: uses rollator at baseline ADLs Comments: receives supervision for bathing        OT Problem List: Decreased strength;Decreased range of motion;Decreased activity tolerance;Impaired balance (sitting and/or standing);Decreased safety awareness;Pain      OT Treatment/Interventions: Self-care/ADL training;Therapeutic exercise;DME and/or AE instruction;Therapeutic activities    OT Goals(Current goals can be found in the care plan section) Acute Rehab OT Goals Patient Stated Goal: return home OT Goal Formulation: With patient/family Time For Goal Achievement: 09/07/21 Potential to Achieve Goals: Good ADL Goals Pt Will Perform Upper Body Dressing: with min guard assist;sitting Pt Will Perform Lower Body Dressing: with min guard assist;with adaptive equipment;sitting/lateral leans Pt Will Transfer to Toilet: with min guard assist;bedside commode;stand pivot transfer Pt Will Perform Tub/Shower Transfer: rolling walker;with min guard assist;shower seat  OT Frequency: Min 3X/week   Barriers to D/C:            Co-evaluation              AM-PAC OT "6 Clicks" Daily Activity     Outcome Measure Help from another person eating meals?: A Little Help from another person taking care of personal grooming?: A Little Help from another person toileting, which includes using toliet, bedpan, or urinal?: A Lot Help from another person bathing (including washing, rinsing, drying)?: A Lot Help from another person to put on and taking off regular upper body clothing?: A Little Help from another person  to put on and taking off regular lower body clothing?: A Lot 6 Click Score: 15   End of Session Equipment Utilized During Treatment: Gait belt;Rolling walker (2 wheels);Oxygen (pt on 1.5L O2) Nurse Communication: Mobility status;Other (comment) (pt BP level and persistent dizziness throughout  session)  Activity Tolerance: Patient limited by pain;Patient limited by fatigue Patient left: in bed;with call bell/phone within reach;with bed alarm set  OT Visit Diagnosis: Unsteadiness on feet (R26.81);Other abnormalities of gait and mobility (R26.89);Muscle weakness (generalized) (M62.81);Pain                Time: 4695-0722 OT Time Calculation (min): 65 min Charges:  OT General Charges $OT Visit: 1 Visit OT Evaluation $OT Eval Low Complexity: 1 Low OT Treatments $Therapeutic Activity: 38-52 mins  Lynnda Child, OTD, OTR/L Acute Rehab [phone]) [phone] [place] 08/24/2021, 9:10 AM

## 2021-08-24 NOTE — TOC CAGE-AID Note (Signed)
Transition of Care [facility]) - CAGE-AID Screening   Patient Details  Name: Kim [name] MRN: [id number] Date of Birth: 11/14/1934  Transition of Care [facility]) CM/SW Contact:    Emile Kyllo C Tarpley-Carter, LCSWA Phone Number: 08/24/2021, 9:43 AM   Clinical Narrative: Pt participated in [place].  Pt stated she does not use substance or ETOH.  Pt was not offered resources, due to no usage of substance or ETOH.     Mattelyn Imhoff Tarpley-Carter, MSW, LCSW-A Pronouns:  She/Her/Hers [facility] Health[facility]itions of Care Clinical Social Worker Direct Number:  [phone] [email or web address]  CAGE-AID Screening:    Have You Ever Felt You Ought to Cut Down on Your Drinking or Drug Use?: No Have People Annoyed You By [employer] Your Drinking Or Drug Use?: No Have You Felt Bad Or Guilty About Your Drinking Or Drug Use?: No Have You Ever Had a Drink or Used Drugs First Thing In The Morning to Steady Your Nerves or to Get Rid of a Hangover?: No CAGE-AID Score: 0  Substance Abuse Education Offered: No

## 2021-08-24 NOTE — Progress Notes (Signed)
[facility] PROGRESS NOTE    Progress Note  Kim [name]  GDJ:[id number] DOB: 1935-03-30 DOA: 08/22/2021 PCP: Sueanne Margarita, DO     Brief Narrative:   Kim [name] is an 86 y.o. female past medical history significant for CAD status post DES stent to the mid RCA, hokum, paroxysmal atrial fibrillation on Eliquis, mitral regurgitation, history of breast cancer status postchemotherapy and radiation comes in with a fall, in the ED was found to be in A. fib with RVR hemoglobin like around 11, sodium 130 potassium 4.1 creatinine 1.4 CT of the head was negative, x-ray of the hip showed displaced fracture of the right femoral neck Eliquis was held her last dose was on 08/22/2019 orthopedic surgery was consulted and recommended ORIF on Monday to await Eliquis washout.  Assessment/Plan:   Displaced right femoral Hip fracture : Orthopedic surgery was consulted r and performed intramedullary nailing on 08/23/2020. Orthopedic surgery recommended the patient start working with physical therapy. She was started back on her Eliquis, continue narcotics and Robaxin for pain control. In a good mood this morning and fully  Preoperative clearance: Her Lyndel Safe perioperative risk for myocardial infarction or cardiac arrest is 0.4.  Paroxysmal atrial fibrillation with RVR: Currently on amiodarone diltiazem and metoprolol rate control. CHA2DVASc score of 6, risk and benefits discussed with the family. Vascular surgery to dictate when to start anticoagulation  Chronic kidney disease stage IIIb: With a baseline creatinine to look around 1.2. Currently tolerating her diet her creatinine has returned to baseline.  CAD status post stent to the mid RCA: Currently asymptomatic continue statins.  Hypothyroidism: Continue Synthroid.  History of breast cancer status postchemotherapy and radiation, Continue current medication.   DVT prophylaxis: lovenox Family Communication:none Status is:  Inpatient  Remains inpatient appropriate because: Acute right femoral neck fracture   Code Status:     Code Status Orders  (From admission, onward)           Start     Ordered   08/22/21 1905  Full code  Continuous        08/22/21 1905           Code Status History     Date Active Date Inactive Code Status Order ID Comments User Context   12/06/2018 1307 12/07/2018 1833 Full Code [id number]  Sherren Mocha, MD Inpatient   11/03/2018 0854 11/03/2018 1524 Full Code [id number]  Larey Dresser, MD Inpatient   10/14/2018 1636 10/18/2018 1706 Full Code [id number]  Reola Mosher Inpatient   03/01/2018 1045 03/02/2018 1414 Full Code [id number]  [name], MD Inpatient   10/24/2017 1113 10/26/2017 1502 Full Code [id number]  Gaynelle Arabian, MD Inpatient   01/17/2015 1254 01/18/2015 1416 Full Code [id number]  Princess Bruins, MD Inpatient   12/19/2013 1803 12/21/2013 1331 Full Code [id number]  Burnell Blanks, MD Inpatient   12/19/2013 1650 12/19/2013 1803 Full Code [id number]  Martin[place], Peter M, MD Inpatient   11/05/2013 1858 11/06/2013 1109 Full Code [id number]  Excell Seltzer, MD Inpatient      Advance Directive Documentation    Flowsheet Row Most Recent Value  Type of Advance Directive Living will  Pre-existing out of facility DNR order (yellow form or pink MOST form) --  "MOST" Form in Place? --         IV Access:   Peripheral IV   Procedures and diagnostic studies:   DG Pelvis 1-2 Views  Result Date: 08/22/2021 CLINICAL DATA:  Fall with right leg pain. EXAM:  PELVIS - 1-2 VIEW COMPARISON:  None. FINDINGS: Fracture of the right femoral neck as described on concurrent femur radiographs. Bony irregularity at the pubic symphysis favored to be degenerative. SI joints are open and symmetric. Nonobstructive bowel gas pattern. IMPRESSION: 1. Fracture of the right femoral neck as described on concurrent femur radiographs. 2. There is bony irregularity at the pubic  symphysis which could be due to degenerative change. Electronically Signed   By: Audie Pinto M.D.   On: 08/22/2021 16:41   CT Head Wo Contrast  Result Date: 08/22/2021 CLINICAL DATA:  Trauma, fall EXAM: CT HEAD WITHOUT CONTRAST TECHNIQUE: Contiguous axial images were obtained from the base of the skull through the vertex without intravenous contrast. COMPARISON:  01/23/2021 FINDINGS: Brain: There are no signs of bleeding. There is prominence of third and both lateral ventricles. There is no shift of midline structures. Cortical sulci are prominent. There is decreased density in subcortical and periventricular white matter. Vascular: There are scattered arterial calcifications. Skull: Unremarkable. Sinuses/Orbits: Unremarkable. Other: None IMPRESSION: There are no signs of bleeding within the cranium. Central and cortical atrophy. Small-vessel disease. Electronically Signed   By: Elmer Picker M.D.   On: 08/22/2021 17:27   DG C-Arm 1-60 Min  Result Date: 08/23/2021 CLINICAL DATA:  Right hip fracture repair. EXAM: DG C-ARM 1-60 MIN FLUOROSCOPY TIME:  Fluoroscopy Time:  0.51 minutes Radiation Exposure Index (if provided by the fluoroscopic device): 4.9 mGy Number of Acquired Spot Images: 0 COMPARISON:  August 22, 2021 x-ray FINDINGS: Two images were obtained during repair of the right hip fracture. An intramedullary rod has been placed with a distal interlocking screw. A gamma nail has been placed as well. IMPRESSION: Right hip fracture repair as above. Electronically Signed   By: Dorise Bullion III M.D.   On: 08/23/2021 20:46   DG FEMUR, MIN 2 VIEWS RIGHT  Result Date: 08/23/2021 CLINICAL DATA:  Right hip fracture repair. EXAM: RIGHT FEMUR 2 VIEWS COMPARISON:  None. FINDINGS: Two fluoroscopic images were obtained during right hip fracture repair. A gamma nail and intramedullary rod have been placed. IMPRESSION: Right hip fracture repair as above. Electronically Signed   By: Dorise Bullion III M.D.   On: 08/23/2021 20:46   DG Femur Min 2 Views Right  Result Date: 08/22/2021 CLINICAL DATA:  Patient had a fall.  Pain in the right leg. EXAM: RIGHT FEMUR 2 VIEWS COMPARISON:  None. FINDINGS: There is a displaced fracture of the right femoral neck with separation of the lesser trochanter. No evidence of dislocation. There is bony irregularity at the pubic symphysis, likely degenerative. IMPRESSION: Displaced fracture of the right femoral neck. Electronically Signed   By: Audie Pinto M.D.   On: 08/22/2021 16:38     Medical Consultants:   None.   Subjective:    GERLINE RATTO in a good mood this morning, making jokes about physical therapy, has not had a bowel movement.  Objective:    Vitals:   08/23/21 2000 08/23/21 2126 08/24/21 0014 08/24/21 0459  BP:  130/70 (!) 100/48 99/61  Pulse:  86 80 81  Resp:  16 16 16   Temp: 98 F (36.7 C) 97.6 F (36.4 C) (!) 97.3 F (36.3 C) (!) 97.5 F (36.4 C)  TempSrc:  Oral Oral Oral  SpO2:  98% 100% 99%  Weight:      Height:       SpO2: 99 % O2 Flow Rate (L/min): 3 L/min   Intake/Output Summary (Last 24 hours)  at 08/24/2021 0803 Last data filed at 08/24/2021 0747 Gross per 24 hour  Intake 1654.86 ml  Output 400 ml  Net 1254.86 ml    Filed Weights   08/22/21 2227  Weight: 67.9 kg    Exam: General exam: In no acute distress. Respiratory system: Good air movement and clear to auscultation. Cardiovascular system: S1 & S2 heard, RRR. No JVD. Gastrointestinal system: Abdomen is nondistended, soft and nontender.  Extremities: No pedal edema. Skin: No rashes, lesions or ulcers Psychiatry: Judgement and insight appear normal. Mood & affect appropriate.    Data Reviewed:    Labs: Basic Metabolic Panel: Recent Labs  Lab 08/22/21 1515 08/24/21 0359  NA 136 132*  K 4.1 4.4  CL 103 99  CO2 23 22  GLUCOSE 129* 126*  BUN 32* 21  CREATININE 1.48* 1.22*  CALCIUM 8.6* 8.2*    GFR Estimated  Creatinine Clearance: 34.6 mL/min (A) (by C-G formula based on SCr of 1.22 mg/dL (H)). Liver Function Tests: No results for input(s): AST, ALT, ALKPHOS, BILITOT, PROT, ALBUMIN in the last 168 hours. No results for input(s): LIPASE, AMYLASE in the last 168 hours. No results for input(s): AMMONIA in the last 168 hours. Coagulation profile No results for input(s): INR, PROTIME in the last 168 hours. COVID-19 Labs  No results for input(s): DDIMER, FERRITIN, LDH, CRP in the last 72 hours.  Lab Results  Component Value Date   SARSCOV2NAA NEGATIVE 08/22/2021   SARSCOV2NAA NEGATIVE 08/11/2020   [place] NEGATIVE 12/08/2019   [place] Not Detected 10/03/2019    CBC: Recent Labs  Lab 08/22/21 1515 08/24/21 0359  WBC 6.9 7.5  HGB 11.0* 7.9*  HCT 34.6* 26.5*  MCV 97.2 103.1*  PLT 171 120*    Cardiac Enzymes: No results for input(s): CKTOTAL, CKMB, CKMBINDEX, TROPONINI in the last 168 hours. BNP (last 3 results) No results for input(s): PROBNP in the last 8760 hours. CBG: No results for input(s): GLUCAP in the last 168 hours. D-Dimer: No results for input(s): DDIMER in the last 72 hours. Hgb A1c: No results for input(s): HGBA1C in the last 72 hours. Lipid Profile: No results for input(s): CHOL, HDL, LDLCALC, TRIG, CHOLHDL, LDLDIRECT in the last 72 hours. Thyroid function studies: No results for input(s): TSH, T4TOTAL, T3FREE, THYROIDAB in the last 72 hours.  Invalid input(s): FREET3 Anemia work up: No results for input(s): VITAMINB12, FOLATE, FERRITIN, TIBC, IRON, RETICCTPCT in the last 72 hours. Sepsis Labs: Recent Labs  Lab 08/22/21 1515 08/24/21 0359  WBC 6.9 7.5    Microbiology Recent Results (from the past 240 hour(s))  Resp Panel by RT-PCR (Flu A&B, Covid) Nasopharyngeal Swab     Status: None   Collection Time: 08/22/21  2:58 PM   Specimen: Nasopharyngeal Swab; Nasopharyngeal(NP) swabs in vial transport medium  Result Value Ref Range Status   SARS  Coronavirus 2 by RT PCR NEGATIVE NEGATIVE Final    Comment: (NOTE) SARS-CoV-2 target nucleic acids are NOT DETECTED.  The SARS-CoV-2 RNA is generally detectable in upper respiratory specimens during the acute phase of infection. The lowest concentration of SARS-CoV-2 viral copies this assay can detect is 138 copies/mL. A negative result does not preclude SARS-Cov-2 infection and should not be used as the sole basis for treatment or other patient management decisions. A negative result may occur with  improper specimen collection/handling, submission of specimen other than nasopharyngeal swab, presence of viral mutation(s) within the areas targeted by this assay, and inadequate number of viral copies(<138 copies/mL). A negative result  must be combined with clinical observations, patient history, and epidemiological information. The expected result is Negative.  Fact Sheet for Patients:  EntrepreneurPulse.com.au  Fact Sheet for Healthcare Providers:  IncredibleEmployment.be  This test is no t yet approved or cleared by the [place] FDA and  has been authorized for detection and/or diagnosis of SARS-CoV-2 by FDA under an Emergency Use Authorization (EUA). This EUA will remain  in effect (meaning this test can be used) for the duration of the COVID-19 declaration under Section 564(b)(1) of the Act, 21 U.S.C.section 360bbb-3(b)(1), unless the authorization is terminated  or revoked sooner.       Influenza A by PCR NEGATIVE NEGATIVE Final   Influenza B by PCR NEGATIVE NEGATIVE Final    Comment: (NOTE) The Xpert Xpress SARS-CoV-2/FLU/RSV plus assay is intended as an aid in the diagnosis of influenza from Nasopharyngeal swab specimens and should not be used as a sole basis for treatment. Nasal washings and aspirates are unacceptable for Xpert Xpress SARS-CoV-2/FLU/RSV testing.  Fact Sheet for  Patients: EntrepreneurPulse.com.au  Fact Sheet for Healthcare Providers: IncredibleEmployment.be  This test is not yet approved or cleared by the [place] FDA and has been authorized for detection and/or diagnosis of SARS-CoV-2 by FDA under an Emergency Use Authorization (EUA). This EUA will remain in effect (meaning this test can be used) for the duration of the COVID-19 declaration under Section 564(b)(1) of the Act, 21 U.S.C. section 360bbb-3(b)(1), unless the authorization is terminated or revoked.  Performed at [facility], [address], [place], [place] [address]   Surgical PCR screen     Status: None   Collection Time: 08/23/21 11:11 AM   Specimen: Nasal Mucosa; Nasal Swab  Result Value Ref Range Status   MRSA, PCR NEGATIVE NEGATIVE Final   Staphylococcus aureus NEGATIVE NEGATIVE Final    Comment: (NOTE) The Xpert SA Assay (FDA approved for NASAL specimens in patients 1 years of age and older), is one component of a comprehensive surveillance program. It is not intended to diagnose infection nor to guide or monitor treatment. Performed at [facility], [place] [address] [place], [place] [address]      Medications:    amiodarone  200 mg Oral Daily   apixaban  5 mg Oral BID   diltiazem  60 mg Oral Q6H   docusate sodium  100 mg Oral BID   exemestane  25 mg Oral Daily   ezetimibe  10 mg Oral Daily   ferrous sulfate  325 mg Oral BID WC   levothyroxine  137 mcg Oral QAC breakfast   metoprolol succinate  25 mg Oral BID   mupirocin ointment  1 application Nasal BID   polyethylene glycol  17 g Oral BID   senna  1 tablet Oral BID   Continuous Infusions:  sodium chloride 50 mL/hr at 08/24/21 0747   methocarbamol (ROBAXIN) IV        LOS: 2 days   Charlynne Cousins  [facility]  08/24/2021, 8:03 AM

## 2021-08-24 NOTE — Progress Notes (Signed)
Physical Therapy Evaluation Patient Details Name: Kim [name] MRN: [id number] DOB: 1935/09/08 Today's Date: 08/24/2021  History of Present Illness  Pt is 86 y.o. female presenting to ED after fall on 11/19. found to have R intertrochanteric hip fracture. S/p closed reduction IM nail on 11/20. PMH includes HTN, HOH, CAD, breast cancer, R TKA, and mitral valve repair.  Clinical Impression  Pt was seen for progression to the chair today, having just received repair of R femoral fracture in a fall at a store.  Pt appears to have been quite independent with a bit of help from her daughter, able to walk on [facility] or rollator.  Pt will be recommended to rehab for recovery of strength, balance, to monitor BP fluctuations and to monitor O2 sats with new use of O2.  Will progress as tolerated, and educated nursing on transfers to return her to bed.  Pt is encouraged to be up to begin mitigation of her orthostasis.  On an IV and will monitor vitals, instruct exercises to LE's and build standing and gait as her symptoms allow.  Pt is able to maintain 50% WB on RLE with minor cues but is demonstrating some memory issues with repeated questions, likely from being [facility] and meds.      Recommendations for follow up therapy are one component of a multi-disciplinary discharge planning process, led by the attending physician.  Recommendations may be updated based on patient status, additional functional criteria and insurance authorization.  Follow Up Recommendations Skilled nursing-short term rehab (<3 hours/day)    Assistance Recommended at Discharge Frequent or constant Supervision/Assistance  Functional Status Assessment Patient has had a recent decline in their functional status and demonstrates the ability to make significant improvements in function in a reasonable and predictable amount of time.  Equipment Recommendations  None recommended by PT    Recommendations for Other Services       Precautions /  Restrictions Precautions Precautions: Fall Precaution Comments: orthostatic at times Restrictions Weight Bearing Restrictions: Yes RLE Weight Bearing: Partial weight bearing RLE Partial Weight Bearing Percentage or Pounds: 50%      Mobility  Bed Mobility Overal bed mobility: Needs Assistance Bed Mobility: Supine to Sit     Supine to sit: Mod assist (assisting RLE as needed)     General bed mobility comments: cues and assistance for movement of RLE    Transfers Overall transfer level: Needs assistance Equipment used: Rolling walker (2 wheels) Transfers: Sit to/from Stand Sit to Stand: Min assist;Mod assist;From elevated surface           General transfer comment: cued hand placement both to stand bedside and to sit on chair    Ambulation/Gait Ambulation/Gait assistance: Min assist Gait Distance (Feet): 4 Feet Assistive device: Rolling walker (2 wheels);1 person hand held assist Gait Pattern/deviations: Step-to pattern;Step-through pattern;Decreased stride length;Decreased stance time - right;Decreased weight shift to right Gait velocity: reduced   Pre-gait activities: instructions for hand placement, wgt shift in standing and tested sidesteps General Gait Details: brief trip to chair from bed due to pt being orthostatic, but has LE's up and chair alarm in place with nursing in to see her.  No acute dizziness with standing  Stairs            Wheelchair Mobility    Modified Rankin (Stroke Patients Only)       Balance Overall balance assessment: Needs assistance Sitting-balance support: Feet supported Sitting balance-Leahy Scale: Fair Sitting balance - Comments: pt is not dizzy in  sitting   Standing balance support: Bilateral upper extremity supported Standing balance-Leahy Scale: Poor Standing balance comment: able to maintain WB precautions during steps to chair but avoids WB on RLE                             Pertinent Vitals/Pain Pain  Assessment: Faces Faces Pain Scale: Hurts whole lot Pain Location: RLE Pain Descriptors / Indicators: Discomfort;Guarding;Grimacing;Operative site guarding    Home Living Family/patient expects to be discharged to:: Private residence Living Arrangements: Alone Available Help at Discharge: Family Type of Home: House Home Access: Level entry       Home Layout: One level Home Equipment: Advice worker (2 wheels);Cane - single point;BSC/3in1      Prior Function Prior Level of Function : Needs assist             Mobility Comments: uses rollator at baseline ADLs Comments: has daughter to help as needed     Hand Dominance   Dominant Hand: Right    Extremity/Trunk Assessment   Upper Extremity Assessment Upper Extremity Assessment: Defer to OT evaluation    Lower Extremity Assessment Lower Extremity Assessment: RLE deficits/detail RLE Deficits / Details: weak and painful to use R hip and knee    Cervical / Trunk Assessment Cervical / Trunk Assessment: Kyphotic (mild)  Communication   Communication: HOH  Cognition Arousal/Alertness: Suspect due to medications Behavior During Therapy: WFL for tasks assessed/performed Overall Cognitive Status: Impaired/Different from baseline Area of Impairment: Attention;Memory;Following commands;Safety/judgement;Awareness;Problem solving                   Current Attention Level: Selective Memory: Decreased short-term memory Following Commands: Follows one step commands inconsistently;Follows one step commands with increased time Safety/Judgement: Decreased awareness of safety;Decreased awareness of deficits Awareness: Intellectual Problem Solving: Slow processing;Requires verbal cues;Requires tactile cues;Decreased initiation General Comments: hearing may be impacting her limits of following instructions, forgetting answers to questions        General Comments General comments (skin integrity, edema, etc.):  pt is up to side of bed with no dizziness complaints, did void in standing and put her in the chair to sit up and get cleaned up from incontinence    Exercises     Assessment/Plan    PT Assessment Patient needs continued PT services  PT Problem List Decreased strength;Decreased balance;Decreased mobility;Decreased coordination;Decreased cognition;Decreased safety awareness;Cardiopulmonary status limiting activity;Decreased skin integrity;Pain       PT Treatment Interventions DME instruction;Gait training;Functional mobility training;Therapeutic activities;Therapeutic exercise;Balance training;Neuromuscular re-education;Patient/family education    PT Goals (Current goals can be found in the Care Plan section)  Acute Rehab PT Goals Patient Stated Goal: to get home today PT Goal Formulation: With patient Time For Goal Achievement: 09/07/21 Potential to Achieve Goals: Good    Frequency Min 3X/week   Barriers to discharge Decreased caregiver support daughter helps pt but does not stay with her    Co-evaluation               AM-PAC PT "6 Clicks" Mobility  Outcome Measure Help needed turning from your back to your side while in a flat bed without using bedrails?: A Little Help needed moving from lying on your back to sitting on the side of a flat bed without using bedrails?: A Lot Help needed moving to and from a bed to a chair (including a wheelchair)?: A Lot Help needed standing up from a chair using your arms (e.g.,  wheelchair or bedside chair)?: A Lot Help needed to walk in hospital room?: A Little Help needed climbing 3-5 steps with a railing? : Total 6 Click Score: 13    End of Session Equipment Utilized During Treatment: Gait belt;Oxygen Activity Tolerance: Patient limited by fatigue;Treatment limited secondary to medical complications (Comment) (orthostatic) Patient left: in chair;with call bell/phone within reach;with chair alarm set (legs elevated) Nurse  Communication: Mobility status;Weight bearing status PT Visit Diagnosis: Unsteadiness on feet (R26.81);Muscle weakness (generalized) (M62.81);Pain;History of falling (Z91.81) Pain - Right/Left: Right Pain - part of body: Hip    Time: 9718-2099 PT Time Calculation (min) (ACUTE ONLY): 34 min   Charges:   PT Evaluation $PT Eval Moderate Complexity: 1 Mod PT Treatments $Therapeutic Activity: 8-22 mins       Ramond Dial 08/24/2021, 1:06 PM  Mee Hives, PT PhD Acute Rehab Dept. Number: [place]

## 2021-08-25 ENCOUNTER — Encounter (HOSPITAL_COMMUNITY): Payer: Self-pay | Admitting: Orthopedic Surgery

## 2021-08-25 DIAGNOSIS — I480 Paroxysmal atrial fibrillation: Secondary | ICD-10-CM | POA: Diagnosis not present

## 2021-08-25 DIAGNOSIS — D62 Acute posthemorrhagic anemia: Secondary | ICD-10-CM

## 2021-08-25 DIAGNOSIS — S72001A Fracture of unspecified part of neck of right femur, initial encounter for closed fracture: Secondary | ICD-10-CM | POA: Diagnosis not present

## 2021-08-25 LAB — CBC
HCT: 22.1 % — ABNORMAL LOW (ref 36.0–46.0)
Hemoglobin: 7 g/dL — ABNORMAL LOW (ref 12.0–15.0)
MCH: 31 pg (ref 26.0–34.0)
MCHC: 31.7 g/dL (ref 30.0–36.0)
MCV: 97.8 fL (ref 80.0–100.0)
Platelets: 107 10*3/uL — ABNORMAL LOW (ref 150–400)
RBC: 2.26 MIL/uL — ABNORMAL LOW (ref 3.87–5.11)
RDW: 13.9 % (ref 11.5–15.5)
WBC: 6.6 10*3/uL (ref 4.0–10.5)
nRBC: 0 % (ref 0.0–0.2)

## 2021-08-25 LAB — HEMOGLOBIN AND HEMATOCRIT, BLOOD
HCT: 27.6 % — ABNORMAL LOW (ref 36.0–46.0)
Hemoglobin: 8.9 g/dL — ABNORMAL LOW (ref 12.0–15.0)

## 2021-08-25 LAB — RETICULOCYTES
Immature Retic Fract: 32.3 % — ABNORMAL HIGH (ref 2.3–15.9)
RBC.: 2.34 MIL/uL — ABNORMAL LOW (ref 3.87–5.11)
Retic Count, Absolute: 70.7 10*3/uL (ref 19.0–186.0)
Retic Ct Pct: 3 % (ref 0.4–3.1)

## 2021-08-25 LAB — FERRITIN: Ferritin: 46 ng/mL (ref 11–307)

## 2021-08-25 LAB — IRON AND TIBC
Iron: 17 ug/dL — ABNORMAL LOW (ref 28–170)
Saturation Ratios: 6 % — ABNORMAL LOW (ref 10.4–31.8)
TIBC: 304 ug/dL (ref 250–450)
UIBC: 287 ug/dL

## 2021-08-25 LAB — FOLATE: Folate: 24.5 ng/mL (ref >5.9)

## 2021-08-25 LAB — VITAMIN B12: Vitamin B-12: 1304 pg/mL — ABNORMAL HIGH (ref 180–914)

## 2021-08-25 LAB — PREPARE RBC (CROSSMATCH)

## 2021-08-25 MED ORDER — DILTIAZEM HCL 30 MG PO TABS
60.0000 mg | ORAL_TABLET | Freq: Four times a day (QID) | ORAL | Status: AC
  Administered 2021-08-25 – 2021-08-26 (×3): 60 mg via ORAL
  Filled 2021-08-25 (×4): qty 2

## 2021-08-25 MED ORDER — SODIUM CHLORIDE 0.9% IV SOLUTION: Freq: Once | INTRAVENOUS | Status: AC

## 2021-08-25 MED ORDER — DILTIAZEM HCL ER COATED BEADS 120 MG PO CP24
240.0000 mg | ORAL_CAPSULE | Freq: Every day | ORAL | Status: DC
  Administered 2021-08-26 – 2021-08-28 (×3): 240 mg via ORAL
  Filled 2021-08-25 (×4): qty 2

## 2021-08-25 NOTE — TOC Initial Note (Addendum)
Transition of Care [facility]) - Initial/Assessment Note    Patient Details  Name: Kim [name] MRN: [id number] Date of Birth: [birth year]  Transition of Care [facility]) CM/SW Contact:    Joanne Chars, LCSW Phone Number: 08/25/2021, 1:39 PM  Clinical Narrative:  CSW attempted to speak with pt by phone, unable to reach, RN assisted, pt declined to speak with CSW but did give permission for CSW to speak with daughter Kim [name].  CSW spoke with Kim [name] by phone, discussed DC recommendation for SNF.  Daughter said PT explained to her and to pt and they are in agreement for SNF.  First choice is [facility], also interested [facility], [facility], and [facility].  Pt lives alone.  HH PT in place now, can't recall agency (per [name], [facility] involved in 10/22) They also recently DC a [facility] aide. PCP in place.  Pt is vaccinated for covid but not boosted.     1545: Medicare.gov choice document given to pt by Leta Jungling, RN.                Expected Discharge Plan: Skilled Nursing Facility Barriers to Discharge: Continued Medical Work up, SNF Pending bed offer   Patient Goals and CMS Choice        Expected Discharge Plan and Services Expected Discharge Plan: [place] In-house Referral: Clinical Social Work   Post Acute Care Choice: [place] Living arrangements for the past 2 months: Advance                                      Prior Living Arrangements/Services Living arrangements for the past 2 months: Single Family Home Lives with:: Self Patient language and need for interpreter reviewed:: Yes        Need for Family Participation in Patient Care: Yes (Comment) Care giver support system in place?: Yes (comment) Current home services: Home OT, Home PT (daughter can't recall-per pt ping, [facility] has been involved) Criminal Activity/Legal Involvement Pertinent to Current Situation/Hospitalization: No - Comment as needed  Activities of Daily  Living Home Assistive Devices/Equipment: Walker (specify type) ADL Screening (condition at time of admission) Patient's cognitive ability adequate to safely complete daily activities?: Yes Is the patient deaf or have difficulty hearing?: Yes (hard of hearing. Wears hearing aids) Does the patient have difficulty seeing, even when wearing glasses/contacts?: Yes Does the patient have difficulty concentrating, remembering, or making decisions?: No Patient able to express need for assistance with ADLs?: Yes Does the patient have difficulty dressing or bathing?: No Independently performs ADLs?: Yes (appropriate for developmental age) Does the patient have difficulty walking or climbing stairs?: No Weakness of Legs: Both Weakness of Arms/Hands: None  Permission Sought/Granted Permission sought to share information with : Family Supports Permission granted to share information with : Yes, Verbal Permission Granted  Share Information with NAME: daughter Kim [name]           Emotional Assessment Appearance::  (unknown: phone assessment) Attitude/Demeanor/Rapport: Unable to Assess Affect (typically observed): Unable to Assess Orientation: : Oriented to Self, Oriented to Situation Alcohol / Substance Use: Not Applicable Psych Involvement: No (comment)  Admission diagnosis:  Hip fracture ([place]) [S72.009A] Fall [W19.XXXA] Closed fracture of right hip, initial encounter [facility]) [S72.001A] Patient Active Problem List   Diagnosis Date Noted   Acute blood loss anemia (ABLA)    Paroxysmal atrial fibrillation with rapid ventricular response ([place]) 08/23/2021  History of breast cancer 08/23/2021   Hip fracture ([place]) 08/22/2021   Severe mitral regurgitation 12/06/2018   Persistent atrial fibrillation with rapid ventricular response ([place]) 10/14/2018   HNP (herniated nucleus pulposus), lumbar 03/01/2018   History of total knee replacement, right 11/13/2017   OA (osteoarthritis) of knee 10/24/2017    Chronic pain of right knee 06/17/2017   Osteoporosis 07/30/2016   Iron deficiency anemia due to chronic blood loss 01/07/2016   Malabsorption of iron 01/07/2016   Postoperative state 01/17/2015   HOCM (hypertrophic obstructive cardiomyopathy) ([place]) 01/09/2014   Breast cancer ([place]) 01/09/2014   Coronary atherosclerosis of native coronary artery 12/20/2013   CAD (coronary artery disease)    Hyperlipidemia    Malignant neoplasm of upper-outer quadrant of left breast in female, estrogen receptor positive ([place]) 11/05/2013   DCIS (ductal carcinoma in situ) of breast 10/26/2013   Hypertension 04/05/2012   UNSPECIFIED HEARING LOSS 08/07/2010   LOW BACK PAIN 04/22/2009   CONSTIPATION 11/04/2008   COLONIC POLYPS, HX OF 11/01/2008   ALLERGIC RHINITIS 06/27/2007   Hypothyroidism 06/22/2007   PCP:  Sueanne Margarita, DO Pharmacy:   [facility], [place] [address] Phone: [phone] Fax: [phone]     Social Determinants of Health (SDOH) Interventions    Readmission Risk Interventions No flowsheet data found.

## 2021-08-25 NOTE — Progress Notes (Signed)
[facility] PROGRESS NOTE    Progress Note  Kim [name]  IHK:[id number] DOB: Feb 23, 1935 DOA: 08/22/2021 PCP: Sueanne Margarita, DO     Brief Narrative:   Kim [name] is an 86 y.o. female past medical history significant for CAD status post DES stent to the mid RCA, hokum, paroxysmal atrial fibrillation on Eliquis, mitral regurgitation, history of breast cancer status postchemotherapy and radiation comes in with a fall, in the ED was found to be in A. fib with RVR hemoglobin like around 11, sodium 130 potassium 4.1 creatinine 1.4 CT of the head was negative, x-ray of the hip showed displaced fracture of the right femoral neck Eliquis was held her last dose was on 08/22/2019 orthopedic surgery was consulted and recommended ORIF on Monday to await Eliquis washout.  Assessment/Plan:   Displaced right femoral Hip fracture : Orthopedic surgery was consulted r and performed intramedullary nailing on 08/23/2020. Eliquis was held due to her drop in her hemoglobin. Continue analgesic for pain control. Physical therapy evaluated the patient and recommended short-term rehab.  Paroxysmal atrial fibrillation with RVR: Currently on amiodarone metoprolol and diltiazem, heart rate slightly elevated will increase metoprolol.   Drop in hemoglobin, hold Eliquis CHA2DVASc score of 6, risk and benefits discussed with the family.  Normocytic anemia/acute blood loss anemia: No signs of overt bleeding likely due to surgical intervention, hold Eliquis transfuse 2 units of packed red blood cells.  Chronic kidney disease stage IIIb: With a baseline creatinine to look around 1.2. Currently tolerating her diet her creatinine has returned to baseline.  CAD status post stent to the mid RCA: Currently asymptomatic continue statins.  Hypothyroidism: Continue Synthroid.  History of breast cancer status postchemotherapy and radiation: Continue current medication.  Normocytic anemia: Check an  anemia panel we will go ahead and transfuse 2 units of packed red blood cells  DVT prophylaxis: SCD Family Communication:none Status is: Inpatient  Remains inpatient appropriate because: Acute right femoral neck fracture   Code Status:     Code Status Orders  (From admission, onward)           Start     Ordered   08/22/21 1905  Full code  Continuous        08/22/21 1905           Code Status History     Date Active Date Inactive Code Status Order ID Comments User Context   12/06/2018 1307 12/07/2018 1833 Full Code [id number]  Sherren Mocha, MD Inpatient   11/03/2018 0854 11/03/2018 1524 Full Code [id number]  Larey Dresser, MD Inpatient   10/14/2018 1636 10/18/2018 1706 Full Code [id number]  Reola Mosher Inpatient   03/01/2018 1045 03/02/2018 1414 Full Code [id number]  [name], MD Inpatient   10/24/2017 1113 10/26/2017 1502 Full Code [id number]  Gaynelle Arabian, MD Inpatient   01/17/2015 1254 01/18/2015 1416 Full Code [id number]  Princess Bruins, MD Inpatient   12/19/2013 1803 12/21/2013 1331 Full Code [id number]  Burnell Blanks, MD Inpatient   12/19/2013 1650 12/19/2013 1803 Full Code [id number]  [place], Peter M, MD Inpatient   11/05/2013 1858 11/06/2013 1109 Full Code [id number]  Excell Seltzer, MD Inpatient      Advance Directive Documentation    Flowsheet Row Most Recent Value  Type of Advance Directive Living will  Pre-existing out of facility DNR order (yellow form or pink MOST form) --  "MOST" Form in Place? --         IV Access:   Peripheral  IV   Procedures and diagnostic studies:   DG C-Arm 1-60 Min  Result Date: 08/23/2021 CLINICAL DATA:  Right hip fracture repair. EXAM: DG C-ARM 1-60 MIN FLUOROSCOPY TIME:  Fluoroscopy Time:  0.51 minutes Radiation Exposure Index (if provided by the fluoroscopic device): 4.9 mGy Number of Acquired Spot Images: 0 COMPARISON:  August 22, 2021 x-ray FINDINGS: Two images were obtained during repair of  the right hip fracture. An intramedullary rod has been placed with a distal interlocking screw. A gamma nail has been placed as well. IMPRESSION: Right hip fracture repair as above. Electronically Signed   By: Dorise Bullion III M.D.   On: 08/23/2021 20:46   DG FEMUR, MIN 2 VIEWS RIGHT  Result Date: 08/23/2021 CLINICAL DATA:  Right hip fracture repair. EXAM: RIGHT FEMUR 2 VIEWS COMPARISON:  None. FINDINGS: Two fluoroscopic images were obtained during right hip fracture repair. A gamma nail and intramedullary rod have been placed. IMPRESSION: Right hip fracture repair as above. Electronically Signed   By: Dorise Bullion III M.D.   On: 08/23/2021 20:46     Medical Consultants:   None.   Subjective:    Kim [name] no complaints she relates her narcotics are working to control her pain.  Objective:    Vitals:   08/24/21 2107 08/24/21 2345 08/25/21 0456 08/25/21 0854  BP: 109/68 113/65 109/60 101/60  Pulse: (!) 105  94 (!) 106  Resp: 16  17 18   Temp: 98.4 F (36.9 C)  99 F (37.2 C) 97.8 F (36.6 C)  TempSrc: Oral  Oral Oral  SpO2: 99%  91% 97%  Weight:      Height:       SpO2: 97 % O2 Flow Rate (L/min): 3 L/min   Intake/Output Summary (Last 24 hours) at 08/25/2021 0959 Last data filed at 08/25/2021 0502 Gross per 24 hour  Intake 613.72 ml  Output 675 ml  Net -61.28 ml    Filed Weights   08/22/21 2227  Weight: 67.9 kg    Exam: General exam: In no acute distress. Respiratory system: Good air movement and clear to auscultation. Cardiovascular system: S1 & S2 heard, RRR. No JVD. Gastrointestinal system: Abdomen is nondistended, soft and nontender.  Extremities: No pedal edema. Skin: No rashes, lesions or ulcers Psychiatry: Judgement and insight appear normal. Mood & affect appropriate.  Data Reviewed:    Labs: Basic Metabolic Panel: Recent Labs  Lab 08/22/21 1515 08/24/21 0359  NA 136 132*  K 4.1 4.4  CL 103 99  CO2 23 22  GLUCOSE 129* 126*   BUN 32* 21  CREATININE 1.48* 1.22*  CALCIUM 8.6* 8.2*    GFR Estimated Creatinine Clearance: 34.6 mL/min (A) (by C-G formula based on SCr of 1.22 mg/dL (H)). Liver Function Tests: No results for input(s): AST, ALT, ALKPHOS, BILITOT, PROT, ALBUMIN in the last 168 hours. No results for input(s): LIPASE, AMYLASE in the last 168 hours. No results for input(s): AMMONIA in the last 168 hours. Coagulation profile No results for input(s): INR, PROTIME in the last 168 hours. COVID-19 Labs  No results for input(s): DDIMER, FERRITIN, LDH, CRP in the last 72 hours.  Lab Results  Component Value Date   SARSCOV2NAA NEGATIVE 08/22/2021   SARSCOV2NAA NEGATIVE 08/11/2020   [place] NEGATIVE 12/08/2019   [place] Not Detected 10/03/2019    CBC: Recent Labs  Lab 08/22/21 1515 08/24/21 0359 08/25/21 0203  WBC 6.9 7.5 6.6  HGB 11.0* 7.9* 7.0*  HCT 34.6* 26.5* 22.1*  MCV 97.2  103.1* 97.8  PLT 171 120* 107*    Cardiac Enzymes: No results for input(s): CKTOTAL, CKMB, CKMBINDEX, TROPONINI in the last 168 hours. BNP (last 3 results) No results for input(s): PROBNP in the last 8760 hours. CBG: No results for input(s): GLUCAP in the last 168 hours. D-Dimer: No results for input(s): DDIMER in the last 72 hours. Hgb A1c: No results for input(s): HGBA1C in the last 72 hours. Lipid Profile: No results for input(s): CHOL, HDL, LDLCALC, TRIG, CHOLHDL, LDLDIRECT in the last 72 hours. Thyroid function studies: No results for input(s): TSH, T4TOTAL, T3FREE, THYROIDAB in the last 72 hours.  Invalid input(s): FREET3 Anemia work up: No results for input(s): VITAMINB12, FOLATE, FERRITIN, TIBC, IRON, RETICCTPCT in the last 72 hours. Sepsis Labs: Recent Labs  Lab 08/22/21 1515 08/24/21 0359 08/25/21 0203  WBC 6.9 7.5 6.6    Microbiology Recent Results (from the past 240 hour(s))  Resp Panel by RT-PCR (Flu A&B, Covid) Nasopharyngeal Swab     Status: None   Collection Time: 08/22/21   2:58 PM   Specimen: Nasopharyngeal Swab; Nasopharyngeal(NP) swabs in vial transport medium  Result Value Ref Range Status   SARS Coronavirus 2 by RT PCR NEGATIVE NEGATIVE Final    Comment: (NOTE) SARS-CoV-2 target nucleic acids are NOT DETECTED.  The SARS-CoV-2 RNA is generally detectable in upper respiratory specimens during the acute phase of infection. The lowest concentration of SARS-CoV-2 viral copies this assay can detect is 138 copies/mL. A negative result does not preclude SARS-Cov-2 infection and should not be used as the sole basis for treatment or other patient management decisions. A negative result may occur with  improper specimen collection/handling, submission of specimen other than nasopharyngeal swab, presence of viral mutation(s) within the areas targeted by this assay, and inadequate number of viral copies(<138 copies/mL). A negative result must be combined with clinical observations, patient history, and epidemiological information. The expected result is Negative.  Fact Sheet for Patients:  EntrepreneurPulse.com.au  Fact Sheet for Healthcare Providers:  IncredibleEmployment.be  This test is no t yet approved or cleared by the [place] FDA and  has been authorized for detection and/or diagnosis of SARS-CoV-2 by FDA under an Emergency Use Authorization (EUA). This EUA will remain  in effect (meaning this test can be used) for the duration of the COVID-19 declaration under Section 564(b)(1) of the Act, 21 U.S.C.section 360bbb-3(b)(1), unless the authorization is terminated  or revoked sooner.       Influenza A by PCR NEGATIVE NEGATIVE Final   Influenza B by PCR NEGATIVE NEGATIVE Final    Comment: (NOTE) The Xpert Xpress SARS-CoV-2/FLU/RSV plus assay is intended as an aid in the diagnosis of influenza from Nasopharyngeal swab specimens and should not be used as a sole basis for treatment. Nasal washings and aspirates  are unacceptable for Xpert Xpress SARS-CoV-2/FLU/RSV testing.  Fact Sheet for Patients: EntrepreneurPulse.com.au  Fact Sheet for Healthcare Providers: IncredibleEmployment.be  This test is not yet approved or cleared by the [place] FDA and has been authorized for detection and/or diagnosis of SARS-CoV-2 by FDA under an Emergency Use Authorization (EUA). This EUA will remain in effect (meaning this test can be used) for the duration of the COVID-19 declaration under Section 564(b)(1) of the Act, 21 U.S.C. section 360bbb-3(b)(1), unless the authorization is terminated or revoked.  Performed at [facility], [place] [address], [place], [place] [address]   Surgical PCR screen     Status: None   Collection Time: 08/23/21 11:11 AM  Specimen: Nasal Mucosa; Nasal Swab  Result Value Ref Range Status   MRSA, PCR NEGATIVE NEGATIVE Final   Staphylococcus aureus NEGATIVE NEGATIVE Final    Comment: (NOTE) The Xpert SA Assay (FDA approved for NASAL specimens in patients 44 years of age and older), is one component of a comprehensive surveillance program. It is not intended to diagnose infection nor to guide or monitor treatment. Performed at [facility], [place] [address] [place], [place] [address]      Medications:    amiodarone  200 mg Oral Daily   diltiazem  30 mg Oral Q6H   docusate sodium  100 mg Oral BID   exemestane  25 mg Oral Daily   ezetimibe  10 mg Oral Daily   ferrous sulfate  325 mg Oral BID WC   levothyroxine  137 mcg Oral QAC breakfast   metoprolol succinate  25 mg Oral BID   mupirocin ointment  1 application Nasal BID   senna  1 tablet Oral BID   Continuous Infusions:  sodium chloride 10 mL/hr at 08/24/21 0901   methocarbamol (ROBAXIN) IV        LOS: 3 days   Charlynne Cousins  [facility]  08/25/2021, 9:59 AM

## 2021-08-25 NOTE — NC FL2 (Signed)
[place] LEVEL OF CARE SCREENING TOOL     IDENTIFICATION  Patient Name: Kim [name] Birthdate: 1934-12-04 Sex: female Admission Date (Current Location): 08/22/2021  [facility] and [place] Number:  Herbalist and Address:  [facility], [place] [address], [place], [place] [address]      Provider Number: [id number]  Attending Physician Name and Address:  Charlynne Cousins, MD  Relative Name and Phone Number:  Aris [place] Daughter [phone]    Current Level of Care: Hospital Recommended Level of Care: [place] Prior Approval Number:    Date Approved/Denied:   PASRR Number: [id number]  Discharge Plan: SNF    Current Diagnoses: Patient Active Problem List   Diagnosis Date Noted   Acute blood loss anemia (ABLA)    Paroxysmal atrial fibrillation with rapid ventricular response ([place]) 08/23/2021   History of breast cancer 08/23/2021   Hip fracture ([place]) 08/22/2021   Severe mitral regurgitation 12/06/2018   Persistent atrial fibrillation with rapid ventricular response ([place]) 10/14/2018   HNP (herniated nucleus pulposus), lumbar 03/01/2018   History of total knee replacement, right 11/13/2017   OA (osteoarthritis) of knee 10/24/2017   Chronic pain of right knee 06/17/2017   Osteoporosis 07/30/2016   Iron deficiency anemia due to chronic blood loss 01/07/2016   Malabsorption of iron 01/07/2016   Postoperative state 01/17/2015   HOCM (hypertrophic obstructive cardiomyopathy) ([place]) 01/09/2014   Breast cancer ([place]) 01/09/2014   Coronary atherosclerosis of native coronary artery 12/20/2013   CAD (coronary artery disease)    Hyperlipidemia    Malignant neoplasm of upper-outer quadrant of left breast in female, estrogen receptor positive ([place]) 11/05/2013   DCIS (ductal carcinoma in situ) of breast 10/26/2013   Hypertension 04/05/2012   UNSPECIFIED HEARING LOSS 08/07/2010   LOW BACK PAIN 04/22/2009    CONSTIPATION 11/04/2008   COLONIC POLYPS, HX OF 11/01/2008   ALLERGIC RHINITIS 06/27/2007   Hypothyroidism 06/22/2007    Orientation RESPIRATION BLADDER Height & Weight     Self, Situation  O2 Continent, External catheter Weight: 149 lb 11.1 oz (67.9 kg) Height:  5\' 8"  (172.7 cm)  BEHAVIORAL SYMPTOMS/MOOD NEUROLOGICAL BOWEL NUTRITION STATUS      Continent Diet (see discharge summary)  AMBULATORY STATUS COMMUNICATION OF NEEDS Skin   Limited Assist Verbally Surgical wounds                       Personal Care Assistance Level of Assistance  Bathing, Feeding, Dressing Bathing Assistance: Maximum assistance Feeding assistance: Limited assistance Dressing Assistance: Maximum assistance     Functional Limitations Info  Sight, Hearing, Speech Sight Info: Adequate Hearing Info: Impaired Speech Info: Adequate    SPECIAL CARE FACTORS FREQUENCY  PT (By licensed PT), OT (By licensed OT)     PT Frequency: 5x week OT Frequency: 5x week            Contractures Contractures Info: Not present    Additional Factors Info  Code Status, Allergies Code Status Info: full Allergies Info: Penicillins, Propoxyphene N-acetaminophen, Evolocumab, Levaquin (Levofloxacin), Statins, Cortisone, Myrbetriq (Mirabegron), Pseudoephedrine           Current Medications (08/25/2021):  This is the current hospital active medication list Current Facility-Administered Medications  Medication Dose Route Frequency Provider Last Rate Last Admin   0.9 %  sodium chloride infusion (Manually program via Guardrails IV Fluids)   Intravenous Once Charlynne Cousins, MD       0.9 %  sodium chloride infusion   Intravenous Continuous Charlynne Cousins, MD 10 mL/hr at 08/24/21 0901 Rate Change at 08/24/21 0901   acetaminophen (TYLENOL) tablet 325-650 mg  325-650 mg Oral Q6H PRN Paralee Cancel, MD   650 mg at 08/24/21 2151   alum & mag hydroxide-simeth (MAALOX/MYLANTA) 200-200-20 MG/5ML suspension 30 mL  30  mL Oral Q4H PRN Paralee Cancel, MD       amiodarone (PACERONE) tablet 200 mg  200 mg Oral Daily Paralee Cancel, MD   200 mg at 08/25/21 0945   [START ON 08/26/2021] diltiazem (CARDIZEM CD) 24 hr capsule 240 mg  240 mg Oral Daily Charlynne Cousins, MD       diltiazem (CARDIZEM) tablet 60 mg  60 mg Oral Q6H Charlynne Cousins, MD       docusate sodium (COLACE) capsule 100 mg  100 mg Oral BID Paralee Cancel, MD   100 mg at 08/25/21 0944   exemestane (AROMASIN) tablet 25 mg  25 mg Oral Daily Paralee Cancel, MD   25 mg at 08/25/21 0950   ezetimibe (ZETIA) tablet 10 mg  10 mg Oral Daily Paralee Cancel, MD   10 mg at 08/25/21 0945   ferrous sulfate tablet 325 mg  325 mg Oral BID WC Paralee Cancel, MD   325 mg at 08/25/21 0945   HYDROcodone-acetaminophen (NORCO) 7.5-325 MG per tablet 1-2 tablet  1-2 tablet Oral Q4H PRN Paralee Cancel, MD   1 tablet at 08/25/21 0945   HYDROcodone-acetaminophen (NORCO/VICODIN) 5-325 MG per tablet 1-2 tablet  1-2 tablet Oral Q4H PRN Paralee Cancel, MD   1 tablet at 08/24/21 1546   levothyroxine (SYNTHROID) tablet 137 mcg  137 mcg Oral QAC breakfast Paralee Cancel, MD   137 mcg at 08/25/21 0944   menthol-cetylpyridinium (CEPACOL) lozenge 3 mg  1 lozenge Oral PRN Paralee Cancel, MD       Or   phenol (CHLORASEPTIC) mouth spray 1 spray  1 spray Mouth/Throat PRN Paralee Cancel, MD       methocarbamol (ROBAXIN) tablet 500 mg  500 mg Oral Q6H PRN Paralee Cancel, MD   500 mg at 08/24/21 2151   Or   methocarbamol (ROBAXIN) 500 mg in dextrose 5 % 50 mL IVPB  500 mg Intravenous Q6H PRN Paralee Cancel, MD       metoCLOPramide (REGLAN) tablet 5-10 mg  5-10 mg Oral Q8H PRN Paralee Cancel, MD       Or   metoCLOPramide (REGLAN) injection 5-10 mg  5-10 mg Intravenous Q8H PRN Paralee Cancel, MD       metoprolol succinate (TOPROL-XL) 24 hr tablet 25 mg  25 mg Oral BID Paralee Cancel, MD   25 mg at 08/23/21 2225   morphine 2 MG/ML injection 0.5-1 mg  0.5-1 mg Intravenous Q2H PRN Paralee Cancel, MD   1 mg  at 08/24/21 0456   mupirocin ointment (BACTROBAN) 2 % 1 application  1 application Nasal BID Paralee Cancel, MD   1 application at 14/43/15 0950   ondansetron (ZOFRAN) tablet 4 mg  4 mg Oral Q6H PRN Paralee Cancel, MD       Or   ondansetron [facility]) injection 4 mg  4 mg Intravenous Q6H PRN Paralee Cancel, MD       polyethylene glycol (MIRALAX / GLYCOLAX) packet 17 g  17 g Oral Daily PRN Paralee Cancel, MD       senna Donavan Burnet) tablet 8.6 mg  1 tablet Oral BID Paralee Cancel, MD   8.6 mg at  08/25/21 0945     Discharge Medications: Please see discharge summary for a list of discharge medications.  Relevant Imaging Results:  Relevant Lab Results:   Additional Information SSN: [id number].  Pt is vaccinated for covid but no booster.  Joanne Chars, LCSW

## 2021-08-25 NOTE — Progress Notes (Signed)
Physical Therapy Treatment Patient Details Name: Kim [name] MRN: [id number] DOB: Mar 18, 1935 Today's Date: 08/25/2021   History of Present Illness Pt is 86 y.o. female presenting to ED after fall on 11/19. found to have R intertrochanteric hip fracture. S/p closed reduction IM nail on 11/20. PMH includes HTN, HOH, CAD, breast cancer, R TKA, and mitral valve repair.    PT Comments    Patient progressing slowly towards PT goals. Requires Max A for bed mobility and mod A to stand from EOB with cues for technique. Does not recall WB precautions from prior session or PT coming in to get her OOB yesterday. Able to shuffle feet to get to chair but difficulty advancing LEs to walk. Sp02 remained in mid 90s on RA so kept 02 off at end of session. Tolerated there ex sitting in chair. Continues to be appropriate for [facility]. Will follow.    Recommendations for follow up therapy are one component of a multi-disciplinary discharge planning process, led by the attending physician.  Recommendations may be updated based on patient status, additional functional criteria and insurance authorization.  Follow Up Recommendations  Skilled nursing-short term rehab (<3 hours/day)     Assistance Recommended at Discharge Frequent or constant Supervision/Assistance  Equipment Recommendations  None recommended by PT    Recommendations for Other Services       Precautions / Restrictions Precautions Precautions: Fall Precaution Comments: orthostatic at times Restrictions Weight Bearing Restrictions: Yes RLE Weight Bearing: Partial weight bearing RLE Partial Weight Bearing Percentage or Pounds: 50%     Mobility  Bed Mobility Overal bed mobility: Needs Assistance Bed Mobility: Rolling;Sidelying to Sit Rolling: Mod assist Sidelying to sit: Max assist;HOB elevated       General bed mobility comments: Step by step cues for sequencing, use of rail, assist with RLE, trunk and scooting bottom to EOB.     Transfers Overall transfer level: Needs assistance Equipment used: Rolling walker (2 wheels) Transfers: Sit to/from Stand Sit to Stand: Mod assist           General transfer comment: Mod A to power to standing wtih cues for hand placement/technique as well as foot placement. Stood from [employer], transferred to chair post ambulation.    Ambulation/Gait Ambulation/Gait assistance: Min assist Gait Distance (Feet): 5 Feet Assistive device: Rolling walker (2 wheels) Gait Pattern/deviations: Shuffle;Trunk flexed;Narrow base of support;Decreased step length - right;Decreased step length - left Gait velocity: reduced Gait velocity interpretation: <1.31 ft/sec, indicative of household ambulator   General Gait Details: Able to shuffle feet with use of RW, assist with weight shifting but difficulty offloading LEs. Not compliant with 50% PWB despite cues.   Stairs             Wheelchair Mobility    Modified Rankin (Stroke Patients Only)       Balance Overall balance assessment: Needs assistance Sitting-balance support: Feet supported;Single extremity supported Sitting balance-Leahy Scale: Fair     Standing balance support: During functional activity Standing balance-Leahy Scale: Poor Standing balance comment: Requires UE support in standing, difficulty maintaining PWB with steps.                            Cognition Arousal/Alertness: Awake/alert Behavior During Therapy: WFL for tasks assessed/performed Overall Cognitive Status: No family/caregiver present to determine baseline cognitive functioning Area of Impairment: Memory;Following commands;Safety/judgement;Problem solving  Memory: Decreased short-term memory;Decreased recall of precautions Following Commands: Follows one step commands inconsistently;Follows one step commands with increased time Safety/Judgement: Decreased awareness of safety;Decreased awareness of deficits    Problem Solving: Slow processing;Decreased initiation;Difficulty sequencing;Requires verbal cues;Requires tactile cues General Comments: HOH so requires repetition for all commands. Does not recall hip WB status or PT coming in yesterday for OOB mobility. A&Ox4.        Exercises General Exercises - Lower Extremity Ankle Circles/Pumps: AROM;Both;10 reps;Supine Quad Sets: AROM;Both;10 reps;Supine Long Arc Quad: AAROM;Right;10 reps;Seated    General Comments        Pertinent Vitals/Pain Pain Assessment: Faces Faces Pain Scale: Hurts whole lot Pain Location: RLE with movement Pain Descriptors / Indicators: Discomfort;Guarding;Grimacing;Operative site guarding Pain Intervention(s): Monitored during session;Premedicated before session;Limited activity within patient's tolerance;Repositioned    Home Living                          Prior Function            PT Goals (current goals can now be found in the care plan section) Progress towards PT goals: Progressing toward goals    Frequency    Min 3X/week      PT Plan Current plan remains appropriate    Co-evaluation              AM-PAC PT "6 Clicks" Mobility   Outcome Measure  Help needed turning from your back to your side while in a flat bed without using bedrails?: A Lot Help needed moving from lying on your back to sitting on the side of a flat bed without using bedrails?: Total Help needed moving to and from a bed to a chair (including a wheelchair)?: A Lot Help needed standing up from a chair using your arms (e.g., wheelchair or bedside chair)?: A Lot Help needed to walk in hospital room?: A Little Help needed climbing 3-5 steps with a railing? : Total 6 Click Score: 11    End of Session Equipment Utilized During Treatment: Gait belt Activity Tolerance: Patient tolerated treatment well Patient left: in chair;with call bell/phone within reach;with chair alarm set Nurse Communication: Mobility  status;Weight bearing status PT Visit Diagnosis: Unsteadiness on feet (R26.81);Muscle weakness (generalized) (M62.81);Pain;History of falling (Z91.81) Pain - Right/Left: Right Pain - part of body: Hip     Time: 1001-1020 PT Time Calculation (min) (ACUTE ONLY): 19 min  Charges:  $Therapeutic Activity: 8-22 mins                     Marisa Severin, PT, DPT Acute Rehabilitation Services Pager [phone] Office [phone]      Marguarite Arbour A Candido Flott 08/25/2021, 11:00 AM

## 2021-08-26 DIAGNOSIS — S72001A Fracture of unspecified part of neck of right femur, initial encounter for closed fracture: Secondary | ICD-10-CM | POA: Diagnosis not present

## 2021-08-26 DIAGNOSIS — D62 Acute posthemorrhagic anemia: Secondary | ICD-10-CM | POA: Diagnosis not present

## 2021-08-26 LAB — CBC
HCT: 24.4 % — ABNORMAL LOW (ref 36.0–46.0)
HCT: 27 % — ABNORMAL LOW (ref 36.0–46.0)
Hemoglobin: 8.1 g/dL — ABNORMAL LOW (ref 12.0–15.0)
Hemoglobin: 8.6 g/dL — ABNORMAL LOW (ref 12.0–15.0)
MCH: 30.8 pg (ref 26.0–34.0)
MCH: 30.7 pg (ref 26.0–34.0)
MCHC: 33.2 g/dL (ref 30.0–36.0)
MCHC: 31.9 g/dL (ref 30.0–36.0)
MCV: 92.8 fL (ref 80.0–100.0)
MCV: 96.4 fL (ref 80.0–100.0)
Platelets: 118 10*3/uL — ABNORMAL LOW (ref 150–400)
Platelets: 149 10*3/uL — ABNORMAL LOW (ref 150–400)
RBC: 2.63 MIL/uL — ABNORMAL LOW (ref 3.87–5.11)
RBC: 2.8 MIL/uL — ABNORMAL LOW (ref 3.87–5.11)
RDW: 16.2 % — ABNORMAL HIGH (ref 11.5–15.5)
RDW: 16.3 % — ABNORMAL HIGH (ref 11.5–15.5)
WBC: 6.1 10*3/uL (ref 4.0–10.5)
WBC: 6.7 10*3/uL (ref 4.0–10.5)
nRBC: 0 % (ref 0.0–0.2)
nRBC: 0 % (ref 0.0–0.2)

## 2021-08-26 MED ORDER — SODIUM CHLORIDE 0.9 % IV SOLN
250.0000 mg | Freq: Every day | INTRAVENOUS | Status: AC
  Administered 2021-08-26 – 2021-08-27 (×2): 250 mg via INTRAVENOUS
  Filled 2021-08-26 (×2): qty 20

## 2021-08-26 MED ORDER — APIXABAN 5 MG PO TABS
5.0000 mg | ORAL_TABLET | Freq: Two times a day (BID) | ORAL | Status: DC
  Administered 2021-08-26 – 2021-08-28 (×5): 5 mg via ORAL
  Filled 2021-08-26 (×5): qty 1

## 2021-08-26 NOTE — Progress Notes (Signed)
Physical Therapy Treatment Patient Details Name: Kim [name] MRN: [id number] DOB: 11-04-1934 Today's Date: 08/26/2021   History of Present Illness Pt is 86 y.o. female presenting to ED after fall on 11/19. found to have R intertrochanteric hip fracture. S/p closed reduction IM nail on 11/20. PMH includes HTN, HOH, CAD, breast cancer, R TKA, and mitral valve repair.    PT Comments    Pt admitted with above diagnosis. Pt was able to ambulate only a few feet as she was dizzy with orthostasis.  Pt progressing slowly due to inability to maintain PWB as well as dizziness. MD came in and made aware of orthostatic BPs. Pt currently with functional limitations due to balance and endurance deficits. Pt will benefit from skilled PT to increase their independence and safety with mobility to allow discharge to the venue listed below.    Supine 116/88 77 bpm Sitting 102/72, 90 bpm Standing 79/35, 91 bpm Sitting on 3n1 100/71, 78 bpm After short walk in chair 100/69, 91 bpm    Recommendations for follow up therapy are one component of a multi-disciplinary discharge planning process, led by the attending physician.  Recommendations may be updated based on patient status, additional functional criteria and insurance authorization.  Follow Up Recommendations  Skilled nursing-short term rehab (<3 hours/day)     Assistance Recommended at Discharge Frequent or constant Supervision/Assistance  Equipment Recommendations  None recommended by PT    Recommendations for Other Services       Precautions / Restrictions Precautions Precautions: Fall Precaution Comments: orthostatic at times Restrictions RLE Weight Bearing: Partial weight bearing RLE Partial Weight Bearing Percentage or Pounds: 50     Mobility  Bed Mobility Overal bed mobility: Needs Assistance Bed Mobility: Rolling;Sidelying to Sit Rolling: Mod assist Sidelying to sit: Max assist;HOB elevated;+2 for physical assistance        General bed mobility comments: Step by step cues for sequencing, use of rail, assist with RLE, trunk and scooting bottom to EOB.    Transfers Overall transfer level: Needs assistance Equipment used: Rolling walker (2 wheels) Transfers: Sit to/from Stand;Bed to chair/wheelchair/BSC Sit to Stand: Mod assist;+2 physical assistance;From elevated surface Stand pivot transfers: Min assist;Mod assist;+2 safety/equipment         General transfer comment: Mod A to power to standing wtih cues for hand placement/technique as well as foot placement. Stood from EOB and stated she needded to use bathroom.  Obtained 3N1 and pt pivoted to 3n1. Needed incr cues for sequencing steps and assist to move rW for transfer.    Ambulation/Gait Ambulation/Gait assistance: Min assist;+2 safety/equipment Gait Distance (Feet): 5 Feet Assistive device: Rolling walker (2 wheels) Gait Pattern/deviations: Shuffle;Trunk flexed;Narrow base of support;Decreased step length - right;Decreased step length - left Gait velocity: reduced Gait velocity interpretation: <1.31 ft/sec, indicative of household ambulator   General Gait Details: Able to shuffle feet with use of RW, assist with weight shifting but difficulty offloading LEs. Not compliant with 50% PWB despite cues. Also orthostatic and c/o dizziness and needed to sit down. MD came in and made aware of BPS.   Stairs             Wheelchair Mobility    Modified Rankin (Stroke Patients Only)       Balance Overall balance assessment: Needs assistance Sitting-balance support: Feet supported;Single extremity supported Sitting balance-Leahy Scale: Fair     Standing balance support: During functional activity Standing balance-Leahy Scale: Poor Standing balance comment: Requires UE support in standing, difficulty maintaining PWB  with steps.                            Cognition Arousal/Alertness: Awake/alert Behavior During Therapy: WFL for  tasks assessed/performed Overall Cognitive Status: No family/caregiver present to determine baseline cognitive functioning Area of Impairment: Memory;Following commands;Safety/judgement;Problem solving                   Current Attention Level: Selective Memory: Decreased short-term memory;Decreased recall of precautions Following Commands: Follows one step commands inconsistently;Follows one step commands with increased time Safety/Judgement: Decreased awareness of safety;Decreased awareness of deficits Awareness: Intellectual Problem Solving: Slow processing;Decreased initiation;Difficulty sequencing;Requires verbal cues;Requires tactile cues General Comments: HOH so requires repetition for all commands. Does not recall hip WB status. A&Ox4.        Exercises General Exercises - Lower Extremity Ankle Circles/Pumps: AROM;Both;10 reps;Supine Quad Sets: AROM;Both;10 reps;Supine Long Arc Quad: AAROM;Right;10 reps;Seated Heel Slides: AAROM;Both;10 reps;Supine    General Comments        Pertinent Vitals/Pain Pain Assessment: Faces Faces Pain Scale: Hurts whole lot Breathing: normal Negative Vocalization: none Facial Expression: sad, frightened, frown Body Language: tense, distressed pacing, fidgeting Consolability: no need to console PAINAD Score: 2 Pain Location: RLE with movement Pain Descriptors / Indicators: Discomfort;Guarding;Grimacing;Operative site guarding Pain Intervention(s): Limited activity within patient's tolerance;Monitored during session;Repositioned    Home Living                          Prior Function            PT Goals (current goals can now be found in the care plan section) Acute Rehab PT Goals Patient Stated Goal: to get home today Progress towards PT goals: Progressing toward goals    Frequency    Min 3X/week      PT Plan Current plan remains appropriate    Co-evaluation              AM-PAC PT "6 Clicks"  Mobility   Outcome Measure  Help needed turning from your back to your side while in a flat bed without using bedrails?: A Lot Help needed moving from lying on your back to sitting on the side of a flat bed without using bedrails?: Total Help needed moving to and from a bed to a chair (including a wheelchair)?: Total Help needed standing up from a chair using your arms (e.g., wheelchair or bedside chair)?: Total Help needed to walk in hospital room?: Total Help needed climbing 3-5 steps with a railing? : Total 6 Click Score: 7    End of Session Equipment Utilized During Treatment: Gait belt Activity Tolerance: Patient limited by fatigue (limited by orthostasis) Patient left: in chair;with call bell/phone within reach;with chair alarm set Nurse Communication: Mobility status;Weight bearing status PT Visit Diagnosis: Unsteadiness on feet (R26.81);Muscle weakness (generalized) (M62.81);Pain;History of falling (Z91.81) Pain - Right/Left: Right Pain - part of body: Hip     Time: 9702-6378 PT Time Calculation (min) (ACUTE ONLY): 20 min  Charges:  $Gait Training: 8-22 mins                     Naomee Nowland M,PT Acute Rehab Services [phone] (pager)    Alvira Philips 08/26/2021, 10:10 AM

## 2021-08-26 NOTE — TOC Progression Note (Addendum)
Transition of Care [facility]) - Progression Note    Patient Details  Name: Kim [name] MRN: [id number] Date of Birth: 23-Apr-1935  [facility]) CM/SW Contact  Joanne Chars, LCSW Phone Number: 08/26/2021, 8:51 AM  Clinical Narrative:   CSW spoke with [employer] and they do manage this [employer] policy for SNF authorization.  1100: CSW spoke with [name], presented bed offers, she chose [facility].  Auth submitted in [place].    Expected Discharge Plan: [place] Barriers to Discharge: Continued Medical Work up, SNF Pending bed offer  Expected Discharge Plan and Services Expected Discharge Plan: [place] In-house Referral: Clinical Social Work   Post Acute Care Choice: [place] Living arrangements for the past 2 months: Single Family Home                                       Social Determinants of Health (SDOH) Interventions    Readmission Risk Interventions No flowsheet data found.

## 2021-08-26 NOTE — Progress Notes (Signed)
Mobility Specialist Progress Note   08/26/21 1700  Mobility  Activity Sat and stood x 3  Level of Assistance Maximum assist, patient does 25-49%  RLE Weight Bearing PWB  Distance Ambulated (ft) 0 ft  Mobility Response Tolerated fair  Mobility performed by Mobility specialist  $Mobility charge 1 Mobility   Pt had just received pain meds ~ couple hours ago and reported no pain. MinA for bed mobility to EOB but MaxA for (x3) STS d/t deficit in hip strength. Constant VC were needed on foot placement while standing and sitting. Returned back to bed w/ call bell by side and no reported pain.     [place]land Falling Mobility Specialist Phone Number [phone]

## 2021-08-26 NOTE — Progress Notes (Addendum)
[facility] PROGRESS NOTE    Progress Note  Kim [name]  QQI:[id number] DOB: May 17, 1935 DOA: 08/22/2021 PCP: Sueanne Margarita, DO     Brief Narrative:   Kim [name] is an 86 y.o. female past medical history significant for CAD status post DES stent to the mid RCA, hokum, paroxysmal atrial fibrillation on Eliquis, mitral regurgitation, history of breast cancer status postchemotherapy and radiation comes in with a fall, in the ED was found to be in A. fib with RVR hemoglobin like around 11, sodium 130 potassium 4.1 creatinine 1.4 CT of the head was negative, x-ray of the hip showed displaced fracture of the right femoral neck Eliquis was held -orthopedic surgery was consulted and recommended ORIF, had IM nail on 11/20  Assessment/Plan:   Displaced right femoral Hip fracture : Orthopedic surgery was consulted underwent intramedullary nailing on 08/23/2020. Physical therapy evaluated the patient and recommended short-term rehab. -Became orthostatic, BP dropped to 70s with physical therapy today, repeat hemoglobin -Hospital course complicated by acute blood loss anemia, transfused 1 unit of PRBC yesterday -Eliquis remains on hold, resume this if hemoglobin is stable tomorrow  Paroxysmal atrial fibrillation with RVR: Currently on amiodarone metoprolol and diltiazem  -eliquis held with drop in Hb, resume later today f repeat Hb is stable  Acute blood loss anemia Iron deficiency anemia -Transfused 1 unit of PRBC yesterday -Will give IV iron x1 today -Defer further work-up to PCP  HOCM History of severe MR s/p MV clip -Continue beta-blocker  Chronic kidney disease stage IIIa -Baseline creatinine around 1.2, repeat labs in a.m.  CAD status post stent to the mid RCA: Currently asymptomatic continue statins.  Hypothyroidism: Continue Synthroid.  History of breast cancer status postchemotherapy and radiation: Continue current medication.   DVT prophylaxis: Restart  Eliquis if hemoglobin stable Family Communication:none Disposition: SNF in 1 to 2 days if stable Status is: Inpatient  Remains inpatient appropriate because: Acute right femoral neck fracture   Code Status:     Code Status Orders  (From admission, onward)           Start     Ordered   08/22/21 1905  Full code  Continuous        08/22/21 1905           Code Status History     Date Active Date Inactive Code Status Order ID Comments User Context   12/06/2018 1307 12/07/2018 1833 Full Code [id number]  Sherren Mocha, MD Inpatient   11/03/2018 0854 11/03/2018 1524 Full Code [id number]  Larey Dresser, MD Inpatient   10/14/2018 1636 10/18/2018 1706 Full Code [id number]  Reola Mosher Inpatient   03/01/2018 1045 03/02/2018 1414 Full Code [id number]  [name], MD Inpatient   10/24/2017 1113 10/26/2017 1502 Full Code [id number]  Gaynelle Arabian, MD Inpatient   01/17/2015 1254 01/18/2015 1416 Full Code [id number]  Princess Bruins, MD Inpatient   12/19/2013 1803 12/21/2013 1331 Full Code [id number]  Burnell Blanks, MD Inpatient   12/19/2013 1650 12/19/2013 1803 Full Code [id number]  Martinique, Peter M, MD Inpatient   11/05/2013 1858 11/06/2013 1109 Full Code [id number]  Excell Seltzer, MD Inpatient      Advance Directive Documentation    Flowsheet Row Most Recent Value  Type of Advance Directive Living will  Pre-existing out of facility DNR order (yellow form or pink MOST form) --  "MOST" Form in Place? --         IV Access:   Peripheral IV  Procedures and diagnostic studies:   No results found.   Medical Consultants:   None.   Subjective:    [name] no complaints she relates her narcotics are working to control her pain.  Objective:    Vitals:   08/25/21 2033 08/26/21 0047 08/26/21 0551 08/26/21 0802  BP: 124/74 117/67 136/83 (!) 99/59  Pulse: 100 (!) 110 95 82  Resp: 16  17 17   Temp: 99 F (37.2 C)  98 F (36.7 C) 98.8 F (37.1 C)   TempSrc: Oral  Oral Oral  SpO2: 92%  96% 90%  Weight:      Height:       SpO2: 90 % O2 Flow Rate (L/min): 3 L/min   Intake/Output Summary (Last 24 hours) at 08/26/2021 1226 Last data filed at 08/26/2021 0900 Gross per 24 hour  Intake 653.33 ml  Output 400 ml  Net 253.33 ml   Filed Weights   08/22/21 2227  Weight: 67.9 kg    Exam: General exam: In no acute distress. Respiratory system: Good air movement and clear to auscultation. Cardiovascular system: S1 & S2 heard, RRR. No JVD. Gastrointestinal system: Abdomen is nondistended, soft and nontender.  Extremities: No pedal edema. Skin: No rashes, lesions or ulcers Psychiatry: Judgement and insight appear normal. Mood & affect appropriate.  Data Reviewed:    Labs: Basic Metabolic Panel: Recent Labs  Lab 08/22/21 1515 08/24/21 0359  NA 136 132*  K 4.1 4.4  CL 103 99  CO2 23 22  GLUCOSE 129* 126*  BUN 32* 21  CREATININE 1.48* 1.22*  CALCIUM 8.6* 8.2*   GFR Estimated Creatinine Clearance: 34.6 mL/min (A) (by C-G formula based on SCr of 1.22 mg/dL (H)). Liver Function Tests: No results for input(s): AST, ALT, ALKPHOS, BILITOT, PROT, ALBUMIN in the last 168 hours. No results for input(s): LIPASE, AMYLASE in the last 168 hours. No results for input(s): AMMONIA in the last 168 hours. Coagulation profile No results for input(s): INR, PROTIME in the last 168 hours. COVID-19 Labs  Recent Labs    08/25/21 1038  FERRITIN 46    Lab Results  Component Value Date   SARSCOV2NAA NEGATIVE 08/22/2021   SARSCOV2NAA NEGATIVE 08/11/2020   [place] NEGATIVE 12/08/2019   [place] Not Detected 10/03/2019    CBC: Recent Labs  Lab 08/22/21 1515 08/24/21 0359 08/25/21 0203 08/25/21 1854 08/26/21 0159  WBC 6.9 7.5 6.6  --  6.1  HGB 11.0* 7.9* 7.0* 8.9* 8.1*  HCT 34.6* 26.5* 22.1* 27.6* 24.4*  MCV 97.2 103.1* 97.8  --  92.8  PLT 171 120* 107*  --  118*   Cardiac Enzymes: No results for input(s): CKTOTAL,  CKMB, CKMBINDEX, TROPONINI in the last 168 hours. BNP (last 3 results) No results for input(s): PROBNP in the last 8760 hours. CBG: No results for input(s): GLUCAP in the last 168 hours. D-Dimer: No results for input(s): DDIMER in the last 72 hours. Hgb A1c: No results for input(s): HGBA1C in the last 72 hours. Lipid Profile: No results for input(s): CHOL, HDL, LDLCALC, TRIG, CHOLHDL, LDLDIRECT in the last 72 hours. Thyroid function studies: No results for input(s): TSH, T4TOTAL, T3FREE, THYROIDAB in the last 72 hours.  Invalid input(s): FREET3 Anemia work up: Recent Labs    08/25/21 1038  VITAMINB12 1,304*  FOLATE 24.5  FERRITIN 46  TIBC 304  IRON 17*  RETICCTPCT 3.0   Sepsis Labs: Recent Labs  Lab 08/22/21 1515 08/24/21 0359 08/25/21 0203 08/26/21 0159  WBC 6.9 7.5 6.6  6.1   Microbiology Recent Results (from the past 240 hour(s))  Resp Panel by RT-PCR (Flu A&B, Covid) Nasopharyngeal Swab     Status: None   Collection Time: 08/22/21  2:58 PM   Specimen: Nasopharyngeal Swab; Nasopharyngeal(NP) swabs in vial transport medium  Result Value Ref Range Status   SARS Coronavirus 2 by RT PCR NEGATIVE NEGATIVE Final    Comment: (NOTE) SARS-CoV-2 target nucleic acids are NOT DETECTED.  The SARS-CoV-2 RNA is generally detectable in upper respiratory specimens during the acute phase of infection. The lowest concentration of SARS-CoV-2 viral copies this assay can detect is 138 copies/mL. A negative result does not preclude SARS-Cov-2 infection and should not be used as the sole basis for treatment or other patient management decisions. A negative result may occur with  improper specimen collection/handling, submission of specimen other than nasopharyngeal swab, presence of viral mutation(s) within the areas targeted by this assay, and inadequate number of viral copies(<138 copies/mL). A negative result must be combined with clinical observations, patient history, and  epidemiological information. The expected result is Negative.  Fact Sheet for Patients:  EntrepreneurPulse.com.au  Fact Sheet for Healthcare Providers:  IncredibleEmployment.be  This test is no t yet approved or cleared by the [place] FDA and  has been authorized for detection and/or diagnosis of SARS-CoV-2 by FDA under an Emergency Use Authorization (EUA). This EUA will remain  in effect (meaning this test can be used) for the duration of the COVID-19 declaration under Section 564(b)(1) of the Act, 21 U.S.C.section 360bbb-3(b)(1), unless the authorization is terminated  or revoked sooner.       Influenza A by PCR NEGATIVE NEGATIVE Final   Influenza B by PCR NEGATIVE NEGATIVE Final    Comment: (NOTE) The Xpert Xpress SARS-CoV-2/FLU/RSV plus assay is intended as an aid in the diagnosis of influenza from Nasopharyngeal swab specimens and should not be used as a sole basis for treatment. Nasal washings and aspirates are unacceptable for Xpert Xpress SARS-CoV-2/FLU/RSV testing.  Fact Sheet for Patients: EntrepreneurPulse.com.au  Fact Sheet for Healthcare Providers: IncredibleEmployment.be  This test is not yet approved or cleared by the [place] FDA and has been authorized for detection and/or diagnosis of SARS-CoV-2 by FDA under an Emergency Use Authorization (EUA). This EUA will remain in effect (meaning this test can be used) for the duration of the COVID-19 declaration under Section 564(b)(1) of the Act, 21 U.S.C. section 360bbb-3(b)(1), unless the authorization is terminated or revoked.  Performed at [facility], [place] [address], [place],  [address]   Surgical PCR screen     Status: None   Collection Time: 08/23/21 11:11 AM   Specimen: Nasal Mucosa; Nasal Swab  Result Value Ref Range Status   MRSA, PCR NEGATIVE NEGATIVE Final   Staphylococcus aureus NEGATIVE NEGATIVE Final     Comment: (NOTE) The Xpert SA Assay (FDA approved for NASAL specimens in patients 23 years of age and older), is one component of a comprehensive surveillance program. It is not intended to diagnose infection nor to guide or monitor treatment. Performed at [facility], [place] [address], [place], [place] [address]      Medications:    amiodarone  200 mg Oral Daily   diltiazem  240 mg Oral Daily   docusate sodium  100 mg Oral BID   exemestane  25 mg Oral Daily   ezetimibe  10 mg Oral Daily   ferrous sulfate  325 mg Oral BID WC   levothyroxine  137 mcg Oral QAC breakfast   metoprolol succinate  25 mg Oral BID   mupirocin ointment  1 application Nasal BID   senna  1 tablet Oral BID   Continuous Infusions:  sodium chloride 10 mL/hr at 08/24/21 0901      LOS: 4 days   Domenic Polite  [facility]  08/26/2021, 12:26 PM

## 2021-08-27 DIAGNOSIS — S72001A Fracture of unspecified part of neck of right femur, initial encounter for closed fracture: Secondary | ICD-10-CM | POA: Diagnosis not present

## 2021-08-27 LAB — BPAM RBC
Blood Product Expiration Date: 202211292359
Blood Product Expiration Date: 202212212359
ISSUE DATE / TIME: 202211221137
Unit Type and Rh: 6200
Unit Type and Rh: 6200

## 2021-08-27 LAB — CBC
HCT: 24.4 % — ABNORMAL LOW (ref 36.0–46.0)
HCT: 30.9 % — ABNORMAL LOW (ref 36.0–46.0)
Hemoglobin: 7.8 g/dL — ABNORMAL LOW (ref 12.0–15.0)
Hemoglobin: 9.5 g/dL — ABNORMAL LOW (ref 12.0–15.0)
MCH: 30.5 pg (ref 26.0–34.0)
MCH: 30.4 pg (ref 26.0–34.0)
MCHC: 32 g/dL (ref 30.0–36.0)
MCHC: 30.7 g/dL (ref 30.0–36.0)
MCV: 95.3 fL (ref 80.0–100.0)
MCV: 98.7 fL (ref 80.0–100.0)
Platelets: 142 10*3/uL — ABNORMAL LOW (ref 150–400)
Platelets: 184 10*3/uL (ref 150–400)
RBC: 2.56 MIL/uL — ABNORMAL LOW (ref 3.87–5.11)
RBC: 3.13 MIL/uL — ABNORMAL LOW (ref 3.87–5.11)
RDW: 16.1 % — ABNORMAL HIGH (ref 11.5–15.5)
RDW: 16 % — ABNORMAL HIGH (ref 11.5–15.5)
WBC: 5.5 10*3/uL (ref 4.0–10.5)
WBC: 7.5 10*3/uL (ref 4.0–10.5)
nRBC: 0 % (ref 0.0–0.2)
nRBC: 0.3 % — ABNORMAL HIGH (ref 0.0–0.2)

## 2021-08-27 LAB — BASIC METABOLIC PANEL
Anion gap: 8 (ref 5–15)
BUN: 20 mg/dL (ref 8–23)
CO2: 22 mmol/L (ref 22–32)
Calcium: 8 mg/dL — ABNORMAL LOW (ref 8.9–10.3)
Chloride: 102 mmol/L (ref 98–111)
Creatinine, Ser: 1.26 mg/dL — ABNORMAL HIGH (ref 0.44–1.00)
GFR, Estimated: 42 mL/min — ABNORMAL LOW (ref >60)
Glucose, Bld: 112 mg/dL — ABNORMAL HIGH (ref 70–99)
Potassium: 4.1 mmol/L (ref 3.5–5.1)
Sodium: 132 mmol/L — ABNORMAL LOW (ref 135–145)

## 2021-08-27 LAB — TYPE AND SCREEN
ABO/RH(D): A POS
Antibody Screen: NEGATIVE
Unit division: 0
Unit division: 0

## 2021-08-27 NOTE — Progress Notes (Signed)
[facility] PROGRESS NOTE    Progress Note  Kim [name]  JME:[id number] DOB: May 31, 1935 DOA: 08/22/2021 PCP: Sueanne Margarita, DO     Brief Narrative:   Kim [name] is an 86 y.o. female past medical history significant for CAD status post DES stent to the mid RCA, hokum, paroxysmal atrial fibrillation on Eliquis, mitral regurgitation, history of breast cancer status postchemotherapy and radiation comes in with a fall, in the ED was found to be in A. fib with RVR hemoglobin like around 11, sodium 130 potassium 4.1 creatinine 1.4 CT of the head was negative, x-ray of the hip showed displaced fracture of the right femoral neck Eliquis was held -orthopedic surgery was consulted and recommended ORIF, had IM nail on 11/20  Assessment/Plan:   Displaced right femoral Hip fracture : Orthopedic surgery was consulted underwent intramedullary nailing on 08/23/2020. Physical therapy evaluated the patient and recommended short-term rehab. -Became orthostatic, BP dropped to 70s with physical therapy yesterday, -Transfused 1 unit of PRBC on 11/22, hemoglobin down to 7.8 this morning -Will repeat CBC today and in a.m. -Eliquis was resumed yesterday, no active bleeding noted  Paroxysmal atrial fibrillation with RVR: Currently on amiodarone metoprolol and diltiazem  -Eliquis resumed  Acute blood loss anemia Iron deficiency anemia -Transfused 1 unit of PRBC 11/22 -Given 2 doses of IV iron -Defer further work-up to PCP  HOCM History of severe MR s/p MV clip -Continue beta-blocker  Chronic kidney disease stage IIIa -Baseline creatinine around 1.2, stable  CAD status post stent to the mid RCA: -Stable, continue metoprolol and statins.  Hypothyroidism: Continue Synthroid.  History of breast cancer status postchemotherapy and radiation: Continue Aromasin   DVT prophylaxis: Eliquis CODE STATUS: Full code Family Communication: Son at bedside Disposition: SNF in 1 to 2 days if  stable Status is: Inpatient  Remains inpatient appropriate because: Acute right femoral neck fracture  Consultants -Orthopedics  Procedure: Right hip IM nail 11/20  Subjective:    Kim [name] no complaints she relates her narcotics are working to control her pain.  Objective:    Vitals:   08/26/21 0802 08/26/21 2145 08/27/21 0804 08/27/21 0947  BP: (!) 99/59 107/64 107/71 124/62  Pulse: 82 96 80 81  Resp: 17 17 18    Temp: 98.8 F (37.1 C) 98.8 F (37.1 C) 98.2 F (36.8 C)   TempSrc: Oral Oral Oral   SpO2: 90% 92% 91% 95%  Weight:      Height:       SpO2: 95 % O2 Flow Rate (L/min): 3 L/min   Intake/Output Summary (Last 24 hours) at 08/27/2021 1149 Last data filed at 08/27/2021 0500 Gross per 24 hour  Intake 676.62 ml  Output 1450 ml  Net -773.38 ml   Filed Weights   08/22/21 2227  Weight: 67.9 kg    Exam: General exam: In no acute distress. Respiratory system: Good air movement and clear to auscultation. Cardiovascular system: S1 & S2 heard, RRR. No JVD. Gastrointestinal system: Abdomen is nondistended, soft and nontender.  Extremities: No pedal edema. Skin: No rashes, lesions or ulcers Psychiatry: Judgement and insight appear normal. Mood & affect appropriate.  Data Reviewed:    Labs: Basic Metabolic Panel: Recent Labs  Lab 08/22/21 1515 08/24/21 0359 08/27/21 0133  NA 136 132* 132*  K 4.1 4.4 4.1  CL 103 99 102  CO2 23 22 22   GLUCOSE 129* 126* 112*  BUN 32* 21 20  CREATININE 1.48* 1.22* 1.26*  CALCIUM 8.6* 8.2* 8.0*  GFR Estimated Creatinine Clearance: 33.5 mL/min (A) (by C-G formula based on SCr of 1.26 mg/dL (H)). Liver Function Tests: No results for input(s): AST, ALT, ALKPHOS, BILITOT, PROT, ALBUMIN in the last 168 hours. No results for input(s): LIPASE, AMYLASE in the last 168 hours. No results for input(s): AMMONIA in the last 168 hours. Coagulation profile No results for input(s): INR, PROTIME in the last 168  hours. COVID-19 Labs  Recent Labs    08/25/21 1038  FERRITIN 46    Lab Results  Component Value Date   SARSCOV2NAA NEGATIVE 08/22/2021   SARSCOV2NAA NEGATIVE 08/11/2020   [place] NEGATIVE 12/08/2019   [place] Not Detected 10/03/2019    CBC: Recent Labs  Lab 08/24/21 0359 08/25/21 0203 08/25/21 1854 08/26/21 0159 08/26/21 1149 08/27/21 0133  WBC 7.5 6.6  --  6.1 6.7 5.5  HGB 7.9* 7.0* 8.9* 8.1* 8.6* 7.8*  HCT 26.5* 22.1* 27.6* 24.4* 27.0* 24.4*  MCV 103.1* 97.8  --  92.8 96.4 95.3  PLT 120* 107*  --  118* 149* 142*   Cardiac Enzymes: No results for input(s): CKTOTAL, CKMB, CKMBINDEX, TROPONINI in the last 168 hours. BNP (last 3 results) No results for input(s): PROBNP in the last 8760 hours. CBG: No results for input(s): GLUCAP in the last 168 hours. D-Dimer: No results for input(s): DDIMER in the last 72 hours. Hgb A1c: No results for input(s): HGBA1C in the last 72 hours. Lipid Profile: No results for input(s): CHOL, HDL, LDLCALC, TRIG, CHOLHDL, LDLDIRECT in the last 72 hours. Thyroid function studies: No results for input(s): TSH, T4TOTAL, T3FREE, THYROIDAB in the last 72 hours.  Invalid input(s): FREET3 Anemia work up: Recent Labs    08/25/21 1038  VITAMINB12 1,304*  FOLATE 24.5  FERRITIN 46  TIBC 304  IRON 17*  RETICCTPCT 3.0   Sepsis Labs: Recent Labs  Lab 08/25/21 0203 08/26/21 0159 08/26/21 1149 08/27/21 0133  WBC 6.6 6.1 6.7 5.5   Microbiology Recent Results (from the past 240 hour(s))  Resp Panel by RT-PCR (Flu A&B, Covid) Nasopharyngeal Swab     Status: None   Collection Time: 08/22/21  2:58 PM   Specimen: Nasopharyngeal Swab; Nasopharyngeal(NP) swabs in vial transport medium  Result Value Ref Range Status   SARS Coronavirus 2 by RT PCR NEGATIVE NEGATIVE Final    Comment: (NOTE) SARS-CoV-2 target nucleic acids are NOT DETECTED.  The SARS-CoV-2 RNA is generally detectable in upper respiratory specimens during the acute  phase of infection. The lowest concentration of SARS-CoV-2 viral copies this assay can detect is 138 copies/mL. A negative result does not preclude SARS-Cov-2 infection and should not be used as the sole basis for treatment or other patient management decisions. A negative result may occur with  improper specimen collection/handling, submission of specimen other than nasopharyngeal swab, presence of viral mutation(s) within the areas targeted by this assay, and inadequate number of viral copies(<138 copies/mL). A negative result must be combined with clinical observations, patient history, and epidemiological information. The expected result is Negative.  Fact Sheet for Patients:  EntrepreneurPulse.com.au  Fact Sheet for Healthcare Providers:  IncredibleEmployment.be  This test is no t yet approved or cleared by the [place] FDA and  has been authorized for detection and/or diagnosis of SARS-CoV-2 by FDA under an Emergency Use Authorization (EUA). This EUA will remain  in effect (meaning this test can be used) for the duration of the COVID-19 declaration under Section 564(b)(1) of the Act, 21 U.S.C.section 360bbb-3(b)(1), unless the authorization is terminated  or  revoked sooner.       Influenza A by PCR NEGATIVE NEGATIVE Final   Influenza B by PCR NEGATIVE NEGATIVE Final    Comment: (NOTE) The Xpert Xpress SARS-CoV-2/FLU/RSV plus assay is intended as an aid in the diagnosis of influenza from Nasopharyngeal swab specimens and should not be used as a sole basis for treatment. Nasal washings and aspirates are unacceptable for Xpert Xpress SARS-CoV-2/FLU/RSV testing.  Fact Sheet for Patients: EntrepreneurPulse.com.au  Fact Sheet for Healthcare Providers: IncredibleEmployment.be  This test is not yet approved or cleared by the [place] FDA and has been authorized for detection and/or diagnosis of  SARS-CoV-2 by FDA under an Emergency Use Authorization (EUA). This EUA will remain in effect (meaning this test can be used) for the duration of the COVID-19 declaration under Section 564(b)(1) of the Act, 21 U.S.C. section 360bbb-3(b)(1), unless the authorization is terminated or revoked.  Performed at [facility], [place] [address], [place], [place] [address]   Surgical PCR screen     Status: None   Collection Time: 08/23/21 11:11 AM   Specimen: Nasal Mucosa; Nasal Swab  Result Value Ref Range Status   MRSA, PCR NEGATIVE NEGATIVE Final   Staphylococcus aureus NEGATIVE NEGATIVE Final    Comment: (NOTE) The Xpert SA Assay (FDA approved for NASAL specimens in patients 81 years of age and older), is one component of a comprehensive surveillance program. It is not intended to diagnose infection nor to guide or monitor treatment. Performed at [facility], [place] [address], [place], [place] [address]      Medications:    amiodarone  200 mg Oral Daily   apixaban  5 mg Oral BID   diltiazem  240 mg Oral Daily   docusate sodium  100 mg Oral BID   exemestane  25 mg Oral Daily   ezetimibe  10 mg Oral Daily   ferrous sulfate  325 mg Oral BID WC   levothyroxine  137 mcg Oral QAC breakfast   metoprolol succinate  25 mg Oral BID   mupirocin ointment  1 application Nasal BID   senna  1 tablet Oral BID   Continuous Infusions:  sodium chloride 10 mL/hr at 08/24/21 0901      LOS: 5 days   [facility]  08/27/2021, 11:49 AM

## 2021-08-27 NOTE — Progress Notes (Signed)
Mobility Specialist Progress Note   08/27/21 1045  Mobility  Activity Ambulated in room;Sat and stood x 3  Level of Assistance Maximum assist, patient does 25-49% (+2)  Assistive Device Front wheel walker  RLE Weight Bearing PWB  Distance Ambulated (ft) 10 ft  Mobility Ambulated with assistance in room  Mobility Response Tolerated well  Mobility performed by Mobility specialist;Family member  $Mobility charge 1 Mobility   Received pt in bed having no complaints and agreeable to mobility. MinA for bed mobility +2 for (UE and LE support). MaxA for STS, +2 for physical assistance. Mod VC for hand and feet placement + hip precautions d/t slight cognitive and hearing deficit. Pt voided during ambulation and was assisted in peri care and placed in chair. Left call bell by side w/ RN and family in the room.    During Mobility: 104 HR, 119/60 BP, 95% SpO2  [place] Falling Mobility Specialist Phone Number [phone]

## 2021-08-27 NOTE — TOC Progression Note (Signed)
Transition of Care [facility]) - Progression Note    Patient Details  Name: Kim [name] MRN: [id number] Date of Birth: [birth year]  Transition of Care [facility]) CM/SW [place], [place] Phone Number: 08/27/2021, 9:11 AM  Clinical Narrative:   CSW following for SNF placement. Patient has received insurance authorization, but [facility] does not have staff to admit today. CSW to follow up tomorrow for DC to SNF.   Authorization informationJosem Kaufmann ID [id number], Navi reference [id number], approved 08/26/21 - 08/31/21.    Expected Discharge Plan: [place] Barriers to Discharge: Continued Medical Work up, SNF Pending bed offer  Expected Discharge Plan and Services Expected Discharge Plan: [place] In-house Referral: Clinical Social Work   Post Acute Care Choice: [place] Living arrangements for the past 2 months: Single Family Home                                       Social Determinants of Health (SDOH) Interventions    Readmission Risk Interventions No flowsheet data found.

## 2021-08-27 NOTE — Plan of Care (Signed)
  Problem: Education: Goal: Knowledge of General Education information will improve Description: Including pain rating scale, medication(s)/side effects and non-pharmacologic comfort measures Outcome: Progressing   Problem: Clinical Measurements: Goal: Ability to maintain clinical measurements within normal limits will improve Outcome: Progressing Goal: Will remain free from infection Outcome: Progressing   

## 2021-08-28 DIAGNOSIS — I4891 Unspecified atrial fibrillation: Secondary | ICD-10-CM | POA: Diagnosis not present

## 2021-08-28 DIAGNOSIS — Z7401 Bed confinement status: Secondary | ICD-10-CM | POA: Diagnosis not present

## 2021-08-28 DIAGNOSIS — R059 Cough, unspecified: Secondary | ICD-10-CM | POA: Diagnosis not present

## 2021-08-28 DIAGNOSIS — S72001D Fracture of unspecified part of neck of right femur, subsequent encounter for closed fracture with routine healing: Secondary | ICD-10-CM

## 2021-08-28 DIAGNOSIS — K5900 Constipation, unspecified: Secondary | ICD-10-CM | POA: Diagnosis not present

## 2021-08-28 DIAGNOSIS — N1831 Chronic kidney disease, stage 3a: Secondary | ICD-10-CM | POA: Diagnosis not present

## 2021-08-28 DIAGNOSIS — E039 Hypothyroidism, unspecified: Secondary | ICD-10-CM | POA: Diagnosis not present

## 2021-08-28 DIAGNOSIS — Z4789 Encounter for other orthopedic aftercare: Secondary | ICD-10-CM | POA: Diagnosis not present

## 2021-08-28 DIAGNOSIS — M25551 Pain in right hip: Secondary | ICD-10-CM | POA: Diagnosis not present

## 2021-08-28 DIAGNOSIS — D62 Acute posthemorrhagic anemia: Secondary | ICD-10-CM | POA: Diagnosis not present

## 2021-08-28 DIAGNOSIS — I421 Obstructive hypertrophic cardiomyopathy: Secondary | ICD-10-CM | POA: Diagnosis not present

## 2021-08-28 DIAGNOSIS — M545 Low back pain, unspecified: Secondary | ICD-10-CM | POA: Diagnosis not present

## 2021-08-28 DIAGNOSIS — I2510 Atherosclerotic heart disease of native coronary artery without angina pectoris: Secondary | ICD-10-CM | POA: Diagnosis not present

## 2021-08-28 DIAGNOSIS — S72001A Fracture of unspecified part of neck of right femur, initial encounter for closed fracture: Secondary | ICD-10-CM | POA: Diagnosis not present

## 2021-08-28 DIAGNOSIS — Z4889 Encounter for other specified surgical aftercare: Secondary | ICD-10-CM | POA: Diagnosis not present

## 2021-08-28 DIAGNOSIS — I480 Paroxysmal atrial fibrillation: Secondary | ICD-10-CM | POA: Diagnosis not present

## 2021-08-28 DIAGNOSIS — D649 Anemia, unspecified: Secondary | ICD-10-CM | POA: Diagnosis not present

## 2021-08-28 DIAGNOSIS — I252 Old myocardial infarction: Secondary | ICD-10-CM | POA: Diagnosis not present

## 2021-08-28 DIAGNOSIS — R531 Weakness: Secondary | ICD-10-CM | POA: Diagnosis not present

## 2021-08-28 DIAGNOSIS — Z79899 Other long term (current) drug therapy: Secondary | ICD-10-CM | POA: Diagnosis not present

## 2021-08-28 LAB — BASIC METABOLIC PANEL
Anion gap: 6 (ref 5–15)
BUN: 20 mg/dL (ref 8–23)
CO2: 23 mmol/L (ref 22–32)
Calcium: 8.5 mg/dL — ABNORMAL LOW (ref 8.9–10.3)
Chloride: 105 mmol/L (ref 98–111)
Creatinine, Ser: 1.09 mg/dL — ABNORMAL HIGH (ref 0.44–1.00)
GFR, Estimated: 50 mL/min — ABNORMAL LOW (ref >60)
Glucose, Bld: 108 mg/dL — ABNORMAL HIGH (ref 70–99)
Potassium: 4.1 mmol/L (ref 3.5–5.1)
Sodium: 134 mmol/L — ABNORMAL LOW (ref 135–145)

## 2021-08-28 LAB — RESP PANEL BY RT-PCR (FLU A&B, COVID) ARPGX2
Influenza A by PCR: NEGATIVE
Influenza B by PCR: NEGATIVE
SARS Coronavirus 2 by RT PCR: NEGATIVE

## 2021-08-28 LAB — CBC
HCT: 26 % — ABNORMAL LOW (ref 36.0–46.0)
Hemoglobin: 8.1 g/dL — ABNORMAL LOW (ref 12.0–15.0)
MCH: 30.3 pg (ref 26.0–34.0)
MCHC: 31.2 g/dL (ref 30.0–36.0)
MCV: 97.4 fL (ref 80.0–100.0)
Platelets: 161 10*3/uL (ref 150–400)
RBC: 2.67 MIL/uL — ABNORMAL LOW (ref 3.87–5.11)
RDW: 16 % — ABNORMAL HIGH (ref 11.5–15.5)
WBC: 6.6 10*3/uL (ref 4.0–10.5)
nRBC: 0.5 % — ABNORMAL HIGH (ref 0.0–0.2)

## 2021-08-28 MED ORDER — SENNA 8.6 MG PO TABS
1.0000 | ORAL_TABLET | Freq: Two times a day (BID) | ORAL | 0 refills | Status: DC
Start: 2021-08-28 — End: 2021-12-29

## 2021-08-28 MED ORDER — MINERAL OIL RE ENEM
1.0000 | ENEMA | Freq: Once | RECTAL | Status: AC
  Administered 2021-08-28: 1 via RECTAL
  Filled 2021-08-28: qty 1

## 2021-08-28 MED ORDER — DILTIAZEM HCL ER COATED BEADS 240 MG PO CP24: 240.0000 mg | ORAL_CAPSULE | Freq: Every day | ORAL | Status: AC

## 2021-08-28 MED ORDER — ACETAMINOPHEN ER 650 MG PO TBCR
650.0000 mg | EXTENDED_RELEASE_TABLET | Freq: Three times a day (TID) | ORAL | Status: DC | PRN
Start: 2021-08-28 — End: 2021-11-05

## 2021-08-28 MED ORDER — ACETAMINOPHEN ER 650 MG PO TBCR: 650.0000 mg | EXTENDED_RELEASE_TABLET | ORAL | Status: DC

## 2021-08-28 MED ORDER — POLYETHYLENE GLYCOL 3350 17 G PO PACK: 17.0000 g | PACK | Freq: Every day | ORAL | 0 refills | Status: DC

## 2021-08-28 NOTE — Plan of Care (Signed)

## 2021-08-28 NOTE — TOC Transition Note (Signed)
Transition of Care [facility]) - CM/SW Discharge Note   Patient Details  Name: Kim [name] MRN: [id number] Date of Birth: May 13, 1935  [facility]) CM/SW Contact:  Emeterio Reeve, LCSW Phone Number: 08/28/2021, 12:05 PM   Clinical Narrative:      Per MD patient ready for DC to [facility]. RN, patient, patient's family, and facility notified of DC. Discharge Summary and FL2 sent to facility. DC packet on chart. Insurance Josem Kaufmann has been received and pt is covid negative. Ambulance transport requested for patient.    RN to call report to [phone].  CSW will sign off for now as social work intervention is no longer needed. Please consult [place] again if new needs arise.   Final next level of care: Skilled Nursing Facility Barriers to Discharge: Barriers Resolved   Patient Goals and CMS Choice        Discharge Placement              Patient chooses bed at: [facility] [place] Patient to be transferred to facility by: [facility]tar Name of family member notified: daughter Patient and family notified of of transfer: 08/28/21  Discharge Plan and Services In-house Referral: Clinical Social Work   Post Acute Care Choice: [place]                               Social Determinants of Health (SDOH) Interventions     Readmission Risk Interventions No flowsheet data found.   Emeterio Reeve, LCSW Clinical Social Worker

## 2021-08-28 NOTE — Discharge Summary (Addendum)
Physician Discharge Summary  Kim [name] OZH:[id number] DOB: 03-17-1935 DOA: 08/22/2021  PCP: Sueanne Margarita, DO  Admit date: 08/22/2021 Discharge date: 08/28/2021  Time spent: 35 minutes  Recommendations for Outpatient Follow-up:  Orthopedics Dr. Alvan Dame in 1 to 2 weeks PCP in 1 week, please check CBC at follow-up,, add oral iron and monitor need for diuretics Partial weightbearing right lower extremity, 50% recommended at discharge until orthopedics follow-up   Discharge Diagnoses:  Principal Problem:   Hip fracture ([place]) Active Problems:   Hypothyroidism   CAD (coronary artery disease)   HOCM (hypertrophic obstructive cardiomyopathy) (H[facility])   Paroxysmal atrial fibrillation with rapid ventricular response ([place])   History of breast cancer   Acute blood loss anemia ([facility])   Discharge Condition: Stable  Diet recommendation: Low-sodium, heart healthy  Filed Weights   08/22/21 2227  Weight: 67.9 kg    History of present illness:  Kim [name] is an 86 y.o. female past medical history significant for CAD status post DES stent to the mid RCA, hokum, paroxysmal atrial fibrillation on Eliquis, mitral regurgitation, history of breast cancer status postchemotherapy and radiation comes in with a fall, in the ED was found to be in A. fib with RVR hemoglobin like around 11, sodium 130 potassium 4.1 creatinine 1.4 CT of the head was negative, x-ray of the hip showed displaced fracture of the right femoral neck   Hospital Course:   Displaced right femoral Hip fracture : Orthopedic surgery was consulted underwent intramedullary nailing on 08/23/2020. -Ortho recommended partial weightbearing, 50% right lower extremity -Physical therapy evaluated the patient and recommended short-term rehab. -Postop course complicated by acute blood loss anemia, orthostatic hypotension -Transfused 1 unit of PRBC, hemoglobin stable and improved now, Eliquis was resumed   Paroxysmal atrial fibrillation  with RVR: Currently on amiodarone metoprolol and diltiazem  -Eliquis resumed yesterday   Acute blood loss anemia Iron deficiency anemia -Transfused 1 unit of PRBC 11/22 -Given 2 doses of IV iron -Follow-up with PCP, add oral iron at follow-up, with worsening constipation this was held at discharge today   HOCM History of severe MR s/p MV clip -Continue beta-blocker -Not on diuretics at baseline, volume status remained euvolemic   Chronic kidney disease stage IIIa -Baseline creatinine around 1.2, stable   CAD status post stent to the mid RCA: -Stable, continue metoprolol and statins.  Hypothyroidism: Continue Synthroid.  History of breast cancer status postchemotherapy and radiation: Continue Aromasin    Consultants -Orthopedics   Procedure: Right hip IM nail 11/20, Dr.Olin  Discharge Exam: Vitals:   08/28/21 0909 08/28/21 1217  BP: 114/76 (!) 141/97  Pulse: (!) 101 97  Resp: 16 18  Temp: 98.6 F (37 C) 98 F (36.7 C)  SpO2: 97% 100%   General exam: In no acute distress. Respiratory system: Good air movement and clear to auscultation. Cardiovascular system: S1 & S2 heard, RRR. No JVD. Gastrointestinal system: Abdomen is nondistended, soft and nontender.  Extremities: No pedal edema.,  Right hip with dressing Skin: No rashes, lesions or ulcers Psychiatry: Judgement and insight appear normal. Mood & affect appropriate.   Discharge Instructions    Allergies as of 08/28/2021       Reactions   Penicillins Anaphylaxis, Swelling   FACIAL SWELLING PATIENT HAS HAD A PCN REACTION WITH IMMEDIATE RASH, FACIAL/TONGUE/THROAT SWELLING, SOB, OR LIGHTHEADEDNESS WITH HYPOTENSION:  #  #  YES  #  #  Has patient had a PCN reaction causing severe rash involving mucus membranes or skin necrosis: No  Has patient had a PCN reaction that required hospitalization: No Has patient had a PCN reaction occurring within the last 10 years: No   Propoxyphene N-acetaminophen Swelling    tongue swelling   Evolocumab Other (See Comments)   Myalgias, fatigue, some itching and burning on her feet,  Arthralgia (Joint Pain)   Levaquin [levofloxacin] Hives   Statins Other (See Comments)   Joint pain   Cortisone Rash   Myrbetriq [mirabegron] Rash   Rash on face   Pseudoephedrine Other (See Comments)   Facial flushing        Medication List     STOP taking these medications    DULoxetine 30 MG capsule Commonly known as: CYMBALTA   ezetimibe 10 MG tablet Commonly known as: ZETIA   potassium chloride 10 MEQ tablet Commonly known as: KLOR-CON       TAKE these medications    acetaminophen 650 MG CR tablet Commonly known as: TYLENOL Take 1 tablet (650 mg total) by mouth every 8 (eight) hours as needed for pain. What changed:  how much to take when to take this reasons to take this   amiodarone 200 MG tablet Commonly known as: PACERONE Take 1 tablet (200 mg total) by mouth daily.   diltiazem 240 MG 24 hr capsule Commonly known as: CARDIZEM CD Take 1 capsule (240 mg total) by mouth daily. What changed:  medication strength how much to take   Eliquis 5 MG Tabs tablet Generic drug: apixaban TAKE 1 TABLET BY MOUTH TWICE (2) DAILY What changed: See the new instructions.   exemestane 25 MG tablet Commonly known as: AROMASIN TAKE 1 TABLET BY MOUTH ONCE DAILY AFTER BREAKFAST What changed: See the new instructions.   HYDROcodone-acetaminophen 5-325 MG tablet Commonly known as: NORCO/VICODIN Take 1-2 tablets by mouth every 6 (six) hours as needed for severe pain.   levothyroxine 137 MCG tablet Commonly known as: SYNTHROID Take 1 tablet (137 mcg total) by mouth daily before breakfast.   methocarbamol 500 MG tablet Commonly known as: ROBAXIN Take 1 tablet (500 mg total) by mouth every 8 (eight) hours as needed for muscle spasms.   metoprolol succinate 25 MG 24 hr tablet Commonly known as: TOPROL-XL Take 1 tablet (25 mg total) by mouth 2 (two) times  daily. Take with or immediately following a meal.   multivitamin with minerals Tabs tablet Take 1 tablet by mouth daily.   nitroGLYCERIN 0.4 MG SL tablet Commonly known as: Nitrostat Place 1 tablet (0.4 mg total) under the tongue every 5 (five) minutes as needed for chest pain.   polyethylene glycol 17 g packet Commonly known as: MIRALAX / GLYCOLAX Take 17 g by mouth daily.   senna 8.6 MG Tabs tablet Commonly known as: SENOKOT Take 1 tablet (8.6 mg total) by mouth 2 (two) times daily.       Allergies  Allergen Reactions   Penicillins Anaphylaxis and Swelling    FACIAL SWELLING PATIENT HAS HAD A PCN REACTION WITH IMMEDIATE RASH, FACIAL/TONGUE/THROAT SWELLING, SOB, OR LIGHTHEADEDNESS WITH HYPOTENSION:  #  #  YES  #  #  Has patient had a PCN reaction causing severe rash involving mucus membranes or skin necrosis: No Has patient had a PCN reaction that required hospitalization: No Has patient had a PCN reaction occurring within the last 10 years: No   Propoxyphene N-Acetaminophen Swelling    tongue swelling   Evolocumab Other (See Comments)    Myalgias, fatigue, some itching and burning on her feet,  Arthralgia (Joint  Pain)   Levaquin [Levofloxacin] Hives   Statins Other (See Comments)    Joint pain   Cortisone Rash   Myrbetriq [Mirabegron] Rash    Rash on face   Pseudoephedrine Other (See Comments)    Facial flushing      The results of significant diagnostics from this hospitalization (including imaging, microbiology, ancillary and laboratory) are listed below for reference.    Significant Diagnostic Studies: DG Pelvis 1-2 Views  Result Date: 08/22/2021 CLINICAL DATA:  Fall with right leg pain. EXAM: PELVIS - 1-2 VIEW COMPARISON:  None. FINDINGS: Fracture of the right femoral neck as described on concurrent femur radiographs. Bony irregularity at the pubic symphysis favored to be degenerative. SI joints are open and symmetric. Nonobstructive bowel gas pattern.  IMPRESSION: 1. Fracture of the right femoral neck as described on concurrent femur radiographs. 2. There is bony irregularity at the pubic symphysis which could be due to degenerative change. Electronically Signed   By: Audie Pinto M.D.   On: 08/22/2021 16:41   CT Head Wo Contrast  Result Date: 08/22/2021 CLINICAL DATA:  Trauma, fall EXAM: CT HEAD WITHOUT CONTRAST TECHNIQUE: Contiguous axial images were obtained from the base of the skull through the vertex without intravenous contrast. COMPARISON:  01/23/2021 FINDINGS: Brain: There are no signs of bleeding. There is prominence of third and both lateral ventricles. There is no shift of midline structures. Cortical sulci are prominent. There is decreased density in subcortical and periventricular white matter. Vascular: There are scattered arterial calcifications. Skull: Unremarkable. Sinuses/Orbits: Unremarkable. Other: None IMPRESSION: There are no signs of bleeding within the cranium. Central and cortical atrophy. Small-vessel disease. Electronically Signed   By: Elmer Picker M.D.   On: 08/22/2021 17:27   DG C-Arm 1-60 Min  Result Date: 08/23/2021 CLINICAL DATA:  Right hip fracture repair. EXAM: DG C-ARM 1-60 MIN FLUOROSCOPY TIME:  Fluoroscopy Time:  0.51 minutes Radiation Exposure Index (if provided by the fluoroscopic device): 4.9 mGy Number of Acquired Spot Images: 0 COMPARISON:  August 22, 2021 x-ray FINDINGS: Two images were obtained during repair of the right hip fracture. An intramedullary rod has been placed with a distal interlocking screw. A gamma nail has been placed as well. IMPRESSION: Right hip fracture repair as above. Electronically Signed   By: Dorise Bullion III M.D.   On: 08/23/2021 20:46   DG FEMUR, MIN 2 VIEWS RIGHT  Result Date: 08/23/2021 CLINICAL DATA:  Right hip fracture repair. EXAM: RIGHT FEMUR 2 VIEWS COMPARISON:  None. FINDINGS: Two fluoroscopic images were obtained during right hip fracture repair. A  gamma nail and intramedullary rod have been placed. IMPRESSION: Right hip fracture repair as above. Electronically Signed   By: Dorise Bullion III M.D.   On: 08/23/2021 20:46   DG Femur Min 2 Views Right  Result Date: 08/22/2021 CLINICAL DATA:  Patient had a fall.  Pain in the right leg. EXAM: RIGHT FEMUR 2 VIEWS COMPARISON:  None. FINDINGS: There is a displaced fracture of the right femoral neck with separation of the lesser trochanter. No evidence of dislocation. There is bony irregularity at the pubic symphysis, likely degenerative. IMPRESSION: Displaced fracture of the right femoral neck. Electronically Signed   By: Audie Pinto M.D.   On: 08/22/2021 16:38    Microbiology: Recent Results (from the past 240 hour(s))  Resp Panel by RT-PCR (Flu A&B, Covid) Nasopharyngeal Swab     Status: None   Collection Time: 08/22/21  2:58 PM   Specimen: Nasopharyngeal Swab; Nasopharyngeal(NP) swabs  in vial transport medium  Result Value Ref Range Status   SARS Coronavirus 2 by RT PCR NEGATIVE NEGATIVE Final    Comment: (NOTE) SARS-CoV-2 target nucleic acids are NOT DETECTED.  The SARS-CoV-2 RNA is generally detectable in upper respiratory specimens during the acute phase of infection. The lowest concentration of SARS-CoV-2 viral copies this assay can detect is 138 copies/mL. A negative result does not preclude SARS-Cov-2 infection and should not be used as the sole basis for treatment or other patient management decisions. A negative result may occur with  improper specimen collection/handling, submission of specimen other than nasopharyngeal swab, presence of viral mutation(s) within the areas targeted by this assay, and inadequate number of viral copies(<138 copies/mL). A negative result must be combined with clinical observations, patient history, and epidemiological information. The expected result is Negative.  Fact Sheet for Patients:  EntrepreneurPulse.com.au  Fact  Sheet for Healthcare Providers:  IncredibleEmployment.be  This test is no t yet approved or cleared by the [place] FDA and  has been authorized for detection and/or diagnosis of SARS-CoV-2 by FDA under an Emergency Use Authorization (EUA). This EUA will remain  in effect (meaning this test can be used) for the duration of the COVID-19 declaration under Section 564(b)(1) of the Act, 21 U.S.C.section 360bbb-3(b)(1), unless the authorization is terminated  or revoked sooner.       Influenza A by PCR NEGATIVE NEGATIVE Final   Influenza B by PCR NEGATIVE NEGATIVE Final    Comment: (NOTE) The Xpert Xpress SARS-CoV-2/FLU/RSV plus assay is intended as an aid in the diagnosis of influenza from Nasopharyngeal swab specimens and should not be used as a sole basis for treatment. Nasal washings and aspirates are unacceptable for Xpert Xpress SARS-CoV-2/FLU/RSV testing.  Fact Sheet for Patients: EntrepreneurPulse.com.au  Fact Sheet for Healthcare Providers: IncredibleEmployment.be  This test is not yet approved or cleared by the [place] FDA and has been authorized for detection and/or diagnosis of SARS-CoV-2 by FDA under an Emergency Use Authorization (EUA). This EUA will remain in effect (meaning this test can be used) for the duration of the COVID-19 declaration under Section 564(b)(1) of the Act, 21 U.S.C. section 360bbb-3(b)(1), unless the authorization is terminated or revoked.  Performed at [facility], [place] [address], [place], [place] [address]   Surgical PCR screen     Status: None   Collection Time: 08/23/21 11:11 AM   Specimen: Nasal Mucosa; Nasal Swab  Result Value Ref Range Status   MRSA, PCR NEGATIVE NEGATIVE Final   Staphylococcus aureus NEGATIVE NEGATIVE Final    Comment: (NOTE) The Xpert SA Assay (FDA approved for NASAL specimens in patients 63 years of age and older), is one component of a  comprehensive surveillance program. It is not intended to diagnose infection nor to guide or monitor treatment. Performed at [facility], [place] [address], [place], [place] [address]   Resp Panel by RT-PCR (Flu A&B, Covid) Nasopharyngeal Swab     Status: None   Collection Time: 08/28/21 11:42 AM   Specimen: Nasopharyngeal Swab; Nasopharyngeal(NP) swabs in vial transport medium  Result Value Ref Range Status   SARS Coronavirus 2 by RT PCR NEGATIVE NEGATIVE Final    Comment: (NOTE) SARS-CoV-2 target nucleic acids are NOT DETECTED.  The SARS-CoV-2 RNA is generally detectable in upper respiratory specimens during the acute phase of infection. The lowest concentration of SARS-CoV-2 viral copies this assay can detect is 138 copies/mL. A negative result does not preclude SARS-Cov-2 infection and  should not be used as the sole basis for treatment or other patient management decisions. A negative result may occur with  improper specimen collection/handling, submission of specimen other than nasopharyngeal swab, presence of viral mutation(s) within the areas targeted by this assay, and inadequate number of viral copies(<138 copies/mL). A negative result must be combined with clinical observations, patient history, and epidemiological information. The expected result is Negative.  Fact Sheet for Patients:  EntrepreneurPulse.com.au  Fact Sheet for Healthcare Providers:  IncredibleEmployment.be  This test is no t yet approved or cleared by the [place] FDA and  has been authorized for detection and/or diagnosis of SARS-CoV-2 by FDA under an Emergency Use Authorization (EUA). This EUA will remain  in effect (meaning this test can be used) for the duration of the COVID-19 declaration under Section 564(b)(1) of the Act, 21 U.S.C.section 360bbb-3(b)(1), unless the authorization is terminated  or revoked sooner.       Influenza A by PCR NEGATIVE  NEGATIVE Final   Influenza B by PCR NEGATIVE NEGATIVE Final    Comment: (NOTE) The Xpert Xpress SARS-CoV-2/FLU/RSV plus assay is intended as an aid in the diagnosis of influenza from Nasopharyngeal swab specimens and should not be used as a sole basis for treatment. Nasal washings and aspirates are unacceptable for Xpert Xpress SARS-CoV-2/FLU/RSV testing.  Fact Sheet for Patients: EntrepreneurPulse.com.au  Fact Sheet for Healthcare Providers: IncredibleEmployment.be  This test is not yet approved or cleared by the [place] FDA and has been authorized for detection and/or diagnosis of SARS-CoV-2 by FDA under an Emergency Use Authorization (EUA). This EUA will remain in effect (meaning this test can be used) for the duration of the COVID-19 declaration under Section 564(b)(1) of the Act, 21 U.S.C. section 360bbb-3(b)(1), unless the authorization is terminated or revoked.  Performed at [facility], [place] [address], [place], [place] [address]      Labs: Basic Metabolic Panel: Recent Labs  Lab 08/22/21 1515 08/24/21 0359 08/27/21 0133 08/28/21 0257  NA 136 132* 132* 134*  K 4.1 4.4 4.1 4.1  CL 103 99 102 105  CO2 23 22 22 23   GLUCOSE 129* 126* 112* 108*  BUN 32* 21 20 20   CREATININE 1.48* 1.22* 1.26* 1.09*  CALCIUM 8.6* 8.2* 8.0* 8.5*   Liver Function Tests: No results for input(s): AST, ALT, ALKPHOS, BILITOT, PROT, ALBUMIN in the last 168 hours. No results for input(s): LIPASE, AMYLASE in the last 168 hours. No results for input(s): AMMONIA in the last 168 hours. CBC: Recent Labs  Lab 08/26/21 0159 08/26/21 1149 08/27/21 0133 08/27/21 1328 08/28/21 0257  WBC 6.1 6.7 5.5 7.5 6.6  HGB 8.1* 8.6* 7.8* 9.5* 8.1*  HCT 24.4* 27.0* 24.4* 30.9* 26.0*  MCV 92.8 96.4 95.3 98.7 97.4  PLT 118* 149* 142* 184 161   Cardiac Enzymes: No results for input(s): CKTOTAL, CKMB, CKMBINDEX, TROPONINI in the last 168 hours. BNP: BNP  (last 3 results) Recent Labs    01/23/21 1549  BNP 385.3*    ProBNP (last 3 results) No results for input(s): PROBNP in the last 8760 hours.  CBG: No results for input(s): GLUCAP in the last 168 hours.     Signed:  Domenic Polite MD.  [facility] 08/28/2021, 1:58 PM

## 2021-08-30 DIAGNOSIS — I421 Obstructive hypertrophic cardiomyopathy: Secondary | ICD-10-CM | POA: Diagnosis not present

## 2021-08-30 DIAGNOSIS — N1831 Chronic kidney disease, stage 3a: Secondary | ICD-10-CM | POA: Diagnosis not present

## 2021-08-30 DIAGNOSIS — M545 Low back pain, unspecified: Secondary | ICD-10-CM | POA: Diagnosis not present

## 2021-08-30 DIAGNOSIS — D649 Anemia, unspecified: Secondary | ICD-10-CM | POA: Diagnosis not present

## 2021-08-30 DIAGNOSIS — I4891 Unspecified atrial fibrillation: Secondary | ICD-10-CM | POA: Diagnosis not present

## 2021-08-30 DIAGNOSIS — M25551 Pain in right hip: Secondary | ICD-10-CM | POA: Diagnosis not present

## 2021-08-30 DIAGNOSIS — K5900 Constipation, unspecified: Secondary | ICD-10-CM | POA: Diagnosis not present

## 2021-08-30 DIAGNOSIS — E039 Hypothyroidism, unspecified: Secondary | ICD-10-CM | POA: Diagnosis not present

## 2021-08-30 DIAGNOSIS — I2510 Atherosclerotic heart disease of native coronary artery without angina pectoris: Secondary | ICD-10-CM | POA: Diagnosis not present

## 2021-09-09 ENCOUNTER — Other Ambulatory Visit: Payer: Self-pay | Admitting: Pharmacist

## 2021-09-14 ENCOUNTER — Ambulatory Visit: Payer: Medicare PPO | Admitting: Hematology & Oncology

## 2021-09-14 ENCOUNTER — Ambulatory Visit: Payer: Medicare PPO

## 2021-09-14 ENCOUNTER — Telehealth: Payer: Self-pay | Admitting: Hematology & Oncology

## 2021-09-14 ENCOUNTER — Other Ambulatory Visit: Payer: Medicare PPO

## 2021-09-16 DIAGNOSIS — Z4789 Encounter for other orthopedic aftercare: Secondary | ICD-10-CM | POA: Diagnosis not present

## 2021-09-21 ENCOUNTER — Telehealth: Payer: Self-pay | Admitting: Hematology & Oncology

## 2021-09-21 NOTE — Telephone Encounter (Signed)
Contacted Patient daughter, Ms. Bertell Maria to advise about mother's scheduled appts, per 12/12 Sch Msg. Daughter is aware of 12/29 appts.

## 2021-10-01 ENCOUNTER — Encounter: Payer: Self-pay | Admitting: Hematology & Oncology

## 2021-10-01 ENCOUNTER — Inpatient Hospital Stay: Payer: Medicare PPO | Admitting: Hematology & Oncology

## 2021-10-01 ENCOUNTER — Other Ambulatory Visit: Payer: Self-pay

## 2021-10-01 ENCOUNTER — Inpatient Hospital Stay: Payer: Medicare PPO

## 2021-10-01 ENCOUNTER — Inpatient Hospital Stay: Payer: Medicare PPO | Attending: Hematology & Oncology

## 2021-10-01 VITALS — BP 137/77 | HR 78 | Temp 98.1°F | Resp 20

## 2021-10-01 DIAGNOSIS — Z79899 Other long term (current) drug therapy: Secondary | ICD-10-CM | POA: Insufficient documentation

## 2021-10-01 DIAGNOSIS — C779 Secondary and unspecified malignant neoplasm of lymph node, unspecified: Secondary | ICD-10-CM | POA: Insufficient documentation

## 2021-10-01 DIAGNOSIS — C50912 Malignant neoplasm of unspecified site of left female breast: Secondary | ICD-10-CM | POA: Insufficient documentation

## 2021-10-01 DIAGNOSIS — C50412 Malignant neoplasm of upper-outer quadrant of left female breast: Secondary | ICD-10-CM | POA: Diagnosis not present

## 2021-10-01 DIAGNOSIS — C50012 Malignant neoplasm of nipple and areola, left female breast: Secondary | ICD-10-CM

## 2021-10-01 DIAGNOSIS — Z170 Estrogen receptor positive status [ER+]: Secondary | ICD-10-CM | POA: Diagnosis not present

## 2021-10-01 DIAGNOSIS — Z79811 Long term (current) use of aromatase inhibitors: Secondary | ICD-10-CM | POA: Diagnosis not present

## 2021-10-01 DIAGNOSIS — M818 Other osteoporosis without current pathological fracture: Secondary | ICD-10-CM

## 2021-10-01 LAB — CBC WITH DIFFERENTIAL (CANCER CENTER ONLY)
Abs Immature Granulocytes: 0.03 10*3/uL (ref 0.00–0.07)
Basophils Absolute: 0 10*3/uL (ref 0.0–0.1)
Basophils Relative: 1 %
Eosinophils Absolute: 0.1 10*3/uL (ref 0.0–0.5)
Eosinophils Relative: 1 %
HCT: 42.7 % (ref 36.0–46.0)
Hemoglobin: 13.5 g/dL (ref 12.0–15.0)
Immature Granulocytes: 1 %
Lymphocytes Relative: 10 %
Lymphs Abs: 0.6 10*3/uL — ABNORMAL LOW (ref 0.7–4.0)
MCH: 32.8 pg (ref 26.0–34.0)
MCHC: 31.6 g/dL (ref 30.0–36.0)
MCV: 103.6 fL — ABNORMAL HIGH (ref 80.0–100.0)
Monocytes Absolute: 0.4 10*3/uL (ref 0.1–1.0)
Monocytes Relative: 7 %
Neutro Abs: 4.3 10*3/uL (ref 1.7–7.7)
Neutrophils Relative %: 80 %
Platelet Count: 208 10*3/uL (ref 150–400)
RBC: 4.12 MIL/uL (ref 3.87–5.11)
RDW: 17 % — ABNORMAL HIGH (ref 11.5–15.5)
WBC Count: 5.4 10*3/uL (ref 4.0–10.5)
nRBC: 0 % (ref 0.0–0.2)

## 2021-10-01 LAB — CMP (CANCER CENTER ONLY)
ALT: 10 U/L (ref 0–44)
AST: 11 U/L — ABNORMAL LOW (ref 15–41)
Albumin: 4.1 g/dL (ref 3.5–5.0)
Alkaline Phosphatase: 107 U/L (ref 38–126)
Anion gap: 10 (ref 5–15)
BUN: 28 mg/dL — ABNORMAL HIGH (ref 8–23)
CO2: 23 mmol/L (ref 22–32)
Calcium: 9.8 mg/dL (ref 8.9–10.3)
Chloride: 103 mmol/L (ref 98–111)
Creatinine: 1.24 mg/dL — ABNORMAL HIGH (ref 0.44–1.00)
GFR, Estimated: 43 mL/min — ABNORMAL LOW (ref >60)
Glucose, Bld: 172 mg/dL — ABNORMAL HIGH (ref 70–99)
Potassium: 3.7 mmol/L (ref 3.5–5.1)
Sodium: 136 mmol/L (ref 135–145)
Total Bilirubin: 0.7 mg/dL (ref 0.3–1.2)
Total Protein: 6.5 g/dL (ref 6.5–8.1)

## 2021-10-01 MED ORDER — ZOLEDRONIC ACID 4 MG/100ML IV SOLN
4.0000 mg | Freq: Once | INTRAVENOUS | Status: AC
  Administered 2021-10-01: 12:00:00 4 mg via INTRAVENOUS
  Filled 2021-10-01: qty 100

## 2021-10-01 MED ORDER — SODIUM CHLORIDE 0.9 % IV SOLN: Freq: Once | INTRAVENOUS | Status: AC

## 2021-10-01 NOTE — Progress Notes (Signed)
Hematology and Oncology Follow Up Visit  EDGAR CORRIGAN [id number] 1935/04/16 86 y.o. 10/01/2021   Principle Diagnosis:  Stage IIA (T1N1M0) adenocarcinoma of the left breast-triple positive Iron deficiency anemia secondary to blood loss  Current Therapy:   Status post cycle 4 of Abraxane/Herceptin Maintenance Herceptin q 3wk - finish 03/25/2015 Zometa 4 mg IV q 6 months - due in 03/2022  Aromasin 25 mg by mouth daily  - start on 01/10/2017  Feraheme as needed-dose given today     Interim History:  Ms.  Schweppe is back for followup.  Unfortunately, she has had [facility].  She apparently fell back in mid November.  She broke her right hip.  She had surgery on 08/23/2021.  She is at the nursing center for rehabilitation.  She certainly has decline in overall status.  She does appear to be somewhat weaker.  There may be an element of dementia.  She comes in with her son.  Thankfully, her son provides most information.  She has not had any issues with COVID.  She has had no cough.  She has had no change in bowel or bladder habits.  Have her main complaint has been pain over on the left lateral anterior chest wall.  This is sort of under the axilla.  She said it goes down her lateral chest wall.  I looked at this area.  I did not see any rash.  I thought maybe it could have been shingles.  I cannot feel anything in the left axilla.  However, she certainly is at risk for recurrence.  I think would be worthwhile doing a CT scan to make sure nothing is going on.  I just hate the fact that she is having a tough time.  She is on Eliquis because of atrial fibrillation.  Thankfully, she had no bleeding when she fell.  Overall, I would say her performance status is ECOG 2, at best.     Medications:  Current Outpatient Medications:    acetaminophen (TYLENOL) 650 MG CR tablet, Take 1 tablet (650 mg total) by mouth every 8 (eight) hours as needed for pain., Disp: , Rfl:    amiodarone  (PACERONE) 200 MG tablet, Take 1 tablet (200 mg total) by mouth daily., Disp: 90 tablet, Rfl: 3   diltiazem (CARDIZEM CD) 240 MG 24 hr capsule, Take 1 capsule (240 mg total) by mouth daily., Disp: , Rfl:    donepezil (ARICEPT) 5 MG tablet, Take 5 mg by mouth at bedtime., Disp: , Rfl:    ELIQUIS 5 MG TABS tablet, TAKE 1 TABLET BY MOUTH TWICE (2) DAILY (Patient taking differently: Take 5 mg by mouth 2 (two) times daily.), Disp: 180 tablet, Rfl: 1   exemestane (AROMASIN) 25 MG tablet, TAKE 1 TABLET BY MOUTH ONCE DAILY AFTER BREAKFAST (Patient taking differently: Take 25 mg by mouth daily after breakfast.), Disp: 90 tablet, Rfl: 3   HYDROcodone-acetaminophen (NORCO/VICODIN) 5-325 MG tablet, Take 1-2 tablets by mouth every 6 (six) hours as needed for severe pain., Disp: 42 tablet, Rfl: 0   levothyroxine (SYNTHROID, LEVOTHROID) 137 MCG tablet, Take 1 tablet (137 mcg total) by mouth daily before breakfast., Disp: 90 tablet, Rfl: 3   methocarbamol (ROBAXIN) 500 MG tablet, Take 1 tablet (500 mg total) by mouth every 8 (eight) hours as needed for muscle spasms., Disp: 30 tablet, Rfl: 0   metoprolol succinate (TOPROL-XL) 25 MG 24 hr tablet, Take 1 tablet (25 mg total) by mouth 2 (two) times daily. Take  with or immediately following a meal., Disp: 60 tablet, Rfl: 3   Multiple Vitamin (MULTIVITAMIN WITH MINERALS) TABS tablet, Take 1 tablet by mouth daily., Disp: , Rfl:    polyethylene glycol (MIRALAX / GLYCOLAX) 17 g packet, Take 17 g by mouth daily., Disp: 14 each, Rfl: 0   senna (SENOKOT) 8.6 MG TABS tablet, Take 1 tablet (8.6 mg total) by mouth 2 (two) times daily., Disp: , Rfl: 0   nitroGLYCERIN (NITROSTAT) 0.4 MG SL tablet, Place 1 tablet (0.4 mg total) under the tongue every 5 (five) minutes as needed for chest pain., Disp: 100 tablet, Rfl: 3  Allergies:  Allergies  Allergen Reactions   Penicillins Anaphylaxis and Swelling    FACIAL SWELLING PATIENT HAS HAD A PCN REACTION WITH IMMEDIATE RASH,  FACIAL/TONGUE/THROAT SWELLING, SOB, OR LIGHTHEADEDNESS WITH HYPOTENSION:  #  #  YES  #  #  Has patient had a PCN reaction causing severe rash involving mucus membranes or skin necrosis: No Has patient had a PCN reaction that required hospitalization: No Has patient had a PCN reaction occurring within the last 10 years: No   Propoxyphene N-Acetaminophen Swelling    tongue swelling   Evolocumab Other (See Comments)    Myalgias, fatigue, some itching and burning on her feet,  Arthralgia (Joint Pain)   Levaquin [Levofloxacin] Hives   Statins Other (See Comments)    Joint pain   Cortisone Rash   Myrbetriq [Mirabegron] Rash    Rash on face   Pseudoephedrine Other (See Comments)    Facial flushing    Past Medical History, Surgical history, Social history, and Family History were reviewed and updated.  Review of Systems: Review of Systems  Constitutional: Negative.   HENT: Negative.    Eyes: Negative.   Respiratory: Negative.    Cardiovascular: Negative.   Gastrointestinal: Negative.   Genitourinary: Negative.   Musculoskeletal:  Positive for back pain and joint pain.  Skin: Negative.   Neurological:  Positive for focal weakness.  Endo/Heme/Allergies: Negative.   Psychiatric/Behavioral: Negative.      Physical Exam:  oral temperature is 98.1 F (36.7 C). Her blood pressure is 137/77 and her pulse is 78. Her respiration is 20 and oxygen saturation is 98%.   Physical Exam Vitals reviewed.  HENT:     Head: Normocephalic and atraumatic.  Eyes:     Pupils: Pupils are equal, round, and reactive to light.  Cardiovascular:     Rate and Rhythm: Normal rate and regular rhythm.     Heart sounds: Normal heart sounds.     Comments: Cardiac exam shows a regular rate and rhythm.  She has a 2/6 systolic ejection murmur. Pulmonary:     Effort: Pulmonary effort is normal.     Breath sounds: Normal breath sounds.  Abdominal:     General: Bowel sounds are normal.     Palpations: Abdomen is  soft.  Musculoskeletal:        General: No tenderness or deformity. Normal range of motion.     Cervical back: Normal range of motion.  Lymphadenopathy:     Cervical: No cervical adenopathy.  Skin:    General: Skin is warm and dry.     Findings: No erythema or rash.  Neurological:     Mental Status: She is alert and oriented to person, place, and time.  Psychiatric:        Behavior: Behavior normal.        Thought Content: Thought content normal.  Judgment: Judgment normal.    Lab Results  Component Value Date   WBC 5.4 10/01/2021   HGB 13.5 10/01/2021   HCT 42.7 10/01/2021   MCV 103.6 (H) 10/01/2021   PLT 208 10/01/2021     Chemistry      Component Value Date/Time   NA 136 10/01/2021 1019   NA 140 06/09/2017 0758   NA 141 10/11/2016 0844   K 3.7 10/01/2021 1019   K 4.5 06/09/2017 0758   K 4.1 10/11/2016 0844   CL 103 10/01/2021 1019   CL 106 06/09/2017 0758   CO2 23 10/01/2021 1019   CO2 28 06/09/2017 0758   CO2 25 10/11/2016 0844   BUN 28 (H) 10/01/2021 1019   BUN 17 06/09/2017 0758   BUN 20.3 10/11/2016 0844   CREATININE 1.24 (H) 10/01/2021 1019   CREATININE 1.2 06/09/2017 0758   CREATININE 1.1 10/11/2016 0844      Component Value Date/Time   CALCIUM 9.8 10/01/2021 1019   CALCIUM 9.1 06/09/2017 0758   CALCIUM 9.5 10/11/2016 0844   ALKPHOS 107 10/01/2021 1019   ALKPHOS 49 06/09/2017 0758   ALKPHOS 52 10/11/2016 0844   AST 11 (L) 10/01/2021 1019   AST 13 10/11/2016 0844   ALT 10 10/01/2021 1019   ALT 19 06/09/2017 0758   ALT 10 10/11/2016 0844   BILITOT 0.7 10/01/2021 1019   BILITOT 0.83 10/11/2016 0844         Impression and Plan: Ms. Goyal is 86 year-old white female with stage IIA ductal carcinoma of the left breast. She had 4 positive lymph nodes. Her breast cancer was triple positive. She completed her adjuvant chemotherapy. She completed this in July of 2015.  She then went on to complete adjuvant Herceptin in June 2016.  For right  now, I do not see any problems with respect to recurrent breast cancer.  I know that she does have a risk of recurrence by the 4+ lymph nodes.  Again, I cannot feel anything in the left axilla or on the left lateral chest wall.  However, we will do a CT scan to make sure nothing is going on.  She will get Zometa today.  She might be moving down to [place].  If so, we can find a oncologist for her down there.  I really hope that she does get better and that she does become more functional.  She will continue on her Aromasin.  I really think this is going be necessary given the 4 positive lymph nodes.    [name], MD 12/29/202211:38 AM

## 2021-10-01 NOTE — Patient Instructions (Signed)

## 2021-10-06 ENCOUNTER — Other Ambulatory Visit (HOSPITAL_COMMUNITY): Payer: Self-pay | Admitting: Cardiology

## 2021-10-13 DIAGNOSIS — I340 Nonrheumatic mitral (valve) insufficiency: Secondary | ICD-10-CM | POA: Diagnosis not present

## 2021-10-13 DIAGNOSIS — E538 Deficiency of other specified B group vitamins: Secondary | ICD-10-CM | POA: Diagnosis not present

## 2021-10-13 DIAGNOSIS — E785 Hyperlipidemia, unspecified: Secondary | ICD-10-CM | POA: Diagnosis not present

## 2021-10-13 DIAGNOSIS — I422 Other hypertrophic cardiomyopathy: Secondary | ICD-10-CM | POA: Diagnosis not present

## 2021-10-13 DIAGNOSIS — D692 Other nonthrombocytopenic purpura: Secondary | ICD-10-CM | POA: Diagnosis not present

## 2021-10-13 DIAGNOSIS — F322 Major depressive disorder, single episode, severe without psychotic features: Secondary | ICD-10-CM | POA: Diagnosis not present

## 2021-10-13 DIAGNOSIS — D6869 Other thrombophilia: Secondary | ICD-10-CM | POA: Diagnosis not present

## 2021-10-13 DIAGNOSIS — I4891 Unspecified atrial fibrillation: Secondary | ICD-10-CM | POA: Diagnosis not present

## 2021-10-13 DIAGNOSIS — M810 Age-related osteoporosis without current pathological fracture: Secondary | ICD-10-CM | POA: Diagnosis not present

## 2021-10-13 DIAGNOSIS — I10 Essential (primary) hypertension: Secondary | ICD-10-CM | POA: Diagnosis not present

## 2021-10-13 DIAGNOSIS — R7989 Other specified abnormal findings of blood chemistry: Secondary | ICD-10-CM | POA: Diagnosis not present

## 2021-10-13 DIAGNOSIS — E039 Hypothyroidism, unspecified: Secondary | ICD-10-CM | POA: Diagnosis not present

## 2021-10-13 DIAGNOSIS — F0390 Unspecified dementia without behavioral disturbance: Secondary | ICD-10-CM | POA: Diagnosis not present

## 2021-10-13 DIAGNOSIS — I2510 Atherosclerotic heart disease of native coronary artery without angina pectoris: Secondary | ICD-10-CM | POA: Diagnosis not present

## 2021-10-13 DIAGNOSIS — N1831 Chronic kidney disease, stage 3a: Secondary | ICD-10-CM | POA: Diagnosis not present

## 2021-10-14 DIAGNOSIS — D0512 Intraductal carcinoma in situ of left breast: Secondary | ICD-10-CM | POA: Diagnosis not present

## 2021-10-14 DIAGNOSIS — I6931 Attention and concentration deficit following cerebral infarction: Secondary | ICD-10-CM | POA: Diagnosis not present

## 2021-10-14 DIAGNOSIS — Z8673 Personal history of transient ischemic attack (TIA), and cerebral infarction without residual deficits: Secondary | ICD-10-CM | POA: Diagnosis not present

## 2021-10-14 DIAGNOSIS — M6281 Muscle weakness (generalized): Secondary | ICD-10-CM | POA: Diagnosis not present

## 2021-10-14 DIAGNOSIS — R2681 Unsteadiness on feet: Secondary | ICD-10-CM | POA: Diagnosis not present

## 2021-10-14 DIAGNOSIS — I480 Paroxysmal atrial fibrillation: Secondary | ICD-10-CM | POA: Diagnosis not present

## 2021-10-14 DIAGNOSIS — E038 Other specified hypothyroidism: Secondary | ICD-10-CM | POA: Diagnosis not present

## 2021-10-14 DIAGNOSIS — F01A0 Vascular dementia, mild, without behavioral disturbance, psychotic disturbance, mood disturbance, and anxiety: Secondary | ICD-10-CM | POA: Diagnosis not present

## 2021-10-15 ENCOUNTER — Ambulatory Visit (HOSPITAL_COMMUNITY)
Admission: RE | Admit: 2021-10-15 | Discharge: 2021-10-15 | Disposition: A | Discharge status: Home / Self Care | Payer: Medicare PPO | Source: Ambulatory Visit | Attending: Hematology & Oncology | Admitting: Hematology & Oncology

## 2021-10-15 ENCOUNTER — Encounter (HOSPITAL_COMMUNITY): Payer: Self-pay

## 2021-10-15 DIAGNOSIS — Z170 Estrogen receptor positive status [ER+]: Secondary | ICD-10-CM | POA: Diagnosis not present

## 2021-10-15 DIAGNOSIS — C50412 Malignant neoplasm of upper-outer quadrant of left female breast: Secondary | ICD-10-CM | POA: Diagnosis not present

## 2021-10-15 DIAGNOSIS — I700 Atherosclerosis of aorta: Secondary | ICD-10-CM | POA: Diagnosis not present

## 2021-10-15 DIAGNOSIS — R911 Solitary pulmonary nodule: Secondary | ICD-10-CM | POA: Diagnosis not present

## 2021-10-15 DIAGNOSIS — R2681 Unsteadiness on feet: Secondary | ICD-10-CM | POA: Diagnosis not present

## 2021-10-15 DIAGNOSIS — M6281 Muscle weakness (generalized): Secondary | ICD-10-CM | POA: Diagnosis not present

## 2021-10-16 ENCOUNTER — Telehealth: Payer: Self-pay

## 2021-10-16 NOTE — Telephone Encounter (Signed)
Called patients daughter, [name] and informed her CT scan showed a small nodule on pts long and they recommended a PET scan to look into it better. Dr.Ennever will place order and scheduling will call her once insurance approves it. She verbalized understanding and will talk to her mom about it tonight as she is in ALF.

## 2021-10-17 DIAGNOSIS — M6281 Muscle weakness (generalized): Secondary | ICD-10-CM | POA: Diagnosis not present

## 2021-10-17 DIAGNOSIS — R2681 Unsteadiness on feet: Secondary | ICD-10-CM | POA: Diagnosis not present

## 2021-10-19 ENCOUNTER — Other Ambulatory Visit: Payer: Self-pay | Admitting: Hematology & Oncology

## 2021-10-19 DIAGNOSIS — R911 Solitary pulmonary nodule: Secondary | ICD-10-CM

## 2021-10-19 NOTE — Progress Notes (Signed)
Pet

## 2021-10-20 DIAGNOSIS — M6281 Muscle weakness (generalized): Secondary | ICD-10-CM | POA: Diagnosis not present

## 2021-10-20 DIAGNOSIS — R2681 Unsteadiness on feet: Secondary | ICD-10-CM | POA: Diagnosis not present

## 2021-10-21 DIAGNOSIS — Z5189 Encounter for other specified aftercare: Secondary | ICD-10-CM | POA: Diagnosis not present

## 2021-10-21 DIAGNOSIS — M25551 Pain in right hip: Secondary | ICD-10-CM | POA: Diagnosis not present

## 2021-10-21 DIAGNOSIS — S72141D Displaced intertrochanteric fracture of right femur, subsequent encounter for closed fracture with routine healing: Secondary | ICD-10-CM | POA: Diagnosis not present

## 2021-10-22 DIAGNOSIS — M6281 Muscle weakness (generalized): Secondary | ICD-10-CM | POA: Diagnosis not present

## 2021-10-22 DIAGNOSIS — R2681 Unsteadiness on feet: Secondary | ICD-10-CM | POA: Diagnosis not present

## 2021-10-23 DIAGNOSIS — M6281 Muscle weakness (generalized): Secondary | ICD-10-CM | POA: Diagnosis not present

## 2021-10-23 DIAGNOSIS — R2681 Unsteadiness on feet: Secondary | ICD-10-CM | POA: Diagnosis not present

## 2021-10-26 DIAGNOSIS — M6281 Muscle weakness (generalized): Secondary | ICD-10-CM | POA: Diagnosis not present

## 2021-10-26 DIAGNOSIS — R2681 Unsteadiness on feet: Secondary | ICD-10-CM | POA: Diagnosis not present

## 2021-10-28 DIAGNOSIS — M25551 Pain in right hip: Secondary | ICD-10-CM | POA: Diagnosis not present

## 2021-10-28 DIAGNOSIS — W19XXXA Unspecified fall, initial encounter: Secondary | ICD-10-CM | POA: Diagnosis not present

## 2021-10-28 DIAGNOSIS — M25552 Pain in left hip: Secondary | ICD-10-CM | POA: Diagnosis not present

## 2021-10-28 DIAGNOSIS — M549 Dorsalgia, unspecified: Secondary | ICD-10-CM | POA: Diagnosis not present

## 2021-10-30 ENCOUNTER — Ambulatory Visit (HOSPITAL_COMMUNITY)
Admission: RE | Admit: 2021-10-30 | Discharge: 2021-10-30 | Disposition: A | Discharge status: Home / Self Care | Payer: Medicare PPO | Source: Ambulatory Visit | Attending: Hematology & Oncology | Admitting: Hematology & Oncology

## 2021-10-30 ENCOUNTER — Telehealth: Payer: Self-pay | Admitting: *Deleted

## 2021-10-30 ENCOUNTER — Other Ambulatory Visit: Payer: Self-pay

## 2021-10-30 DIAGNOSIS — M810 Age-related osteoporosis without current pathological fracture: Secondary | ICD-10-CM | POA: Diagnosis not present

## 2021-10-30 DIAGNOSIS — M6281 Muscle weakness (generalized): Secondary | ICD-10-CM | POA: Diagnosis not present

## 2021-10-30 DIAGNOSIS — R911 Solitary pulmonary nodule: Secondary | ICD-10-CM | POA: Insufficient documentation

## 2021-10-30 DIAGNOSIS — K828 Other specified diseases of gallbladder: Secondary | ICD-10-CM | POA: Diagnosis not present

## 2021-10-30 DIAGNOSIS — R2681 Unsteadiness on feet: Secondary | ICD-10-CM | POA: Diagnosis not present

## 2021-10-30 DIAGNOSIS — Z853 Personal history of malignant neoplasm of breast: Secondary | ICD-10-CM | POA: Diagnosis not present

## 2021-10-30 DIAGNOSIS — K5730 Diverticulosis of large intestine without perforation or abscess without bleeding: Secondary | ICD-10-CM | POA: Diagnosis not present

## 2021-10-30 LAB — GLUCOSE, CAPILLARY: Glucose-Capillary: 88 mg/dL (ref 70–99)

## 2021-10-30 MED ORDER — FLUDEOXYGLUCOSE F - 18 (FDG) INJECTION
7.5000 | Freq: Once | INTRAVENOUS | Status: AC
  Administered 2021-10-30: 7.7 via INTRAVENOUS

## 2021-10-30 NOTE — Telephone Encounter (Signed)
Message received from patient's [name] requesting an appt with Dr Marin Olp to review pt.'s PET scan results.  Message sent to scheduling.

## 2021-11-02 DIAGNOSIS — R2681 Unsteadiness on feet: Secondary | ICD-10-CM | POA: Diagnosis not present

## 2021-11-02 DIAGNOSIS — M6281 Muscle weakness (generalized): Secondary | ICD-10-CM | POA: Diagnosis not present

## 2021-11-04 ENCOUNTER — Telehealth (HOSPITAL_COMMUNITY): Payer: Self-pay | Admitting: Cardiology

## 2021-11-04 DIAGNOSIS — S72109D Unspecified trochanteric fracture of unspecified femur, subsequent encounter for closed fracture with routine healing: Secondary | ICD-10-CM | POA: Diagnosis not present

## 2021-11-04 DIAGNOSIS — E1142 Type 2 diabetes mellitus with diabetic polyneuropathy: Secondary | ICD-10-CM | POA: Diagnosis not present

## 2021-11-04 DIAGNOSIS — M6281 Muscle weakness (generalized): Secondary | ICD-10-CM | POA: Diagnosis not present

## 2021-11-04 NOTE — Telephone Encounter (Signed)
Patients daughter called to request a sooner appt than the one scheduled for 2/10. Reports she was told by the facility provider that she has a lot of fluid and is out of rhythm.   Add on 2/2 @ 240

## 2021-11-05 ENCOUNTER — Encounter (HOSPITAL_COMMUNITY): Payer: Self-pay | Admitting: Cardiology

## 2021-11-05 ENCOUNTER — Other Ambulatory Visit: Payer: Self-pay

## 2021-11-05 ENCOUNTER — Other Ambulatory Visit (HOSPITAL_COMMUNITY): Payer: Self-pay | Admitting: *Deleted

## 2021-11-05 ENCOUNTER — Ambulatory Visit (HOSPITAL_COMMUNITY)
Admission: RE | Admit: 2021-11-05 | Discharge: 2021-11-05 | Disposition: A | Discharge status: Home / Self Care | Payer: Medicare PPO | Source: Ambulatory Visit | Attending: Cardiology | Admitting: Cardiology

## 2021-11-05 VITALS — BP 132/80 | HR 85

## 2021-11-05 DIAGNOSIS — M791 Myalgia, unspecified site: Secondary | ICD-10-CM | POA: Diagnosis not present

## 2021-11-05 DIAGNOSIS — I480 Paroxysmal atrial fibrillation: Secondary | ICD-10-CM | POA: Diagnosis not present

## 2021-11-05 DIAGNOSIS — Z79899 Other long term (current) drug therapy: Secondary | ICD-10-CM | POA: Insufficient documentation

## 2021-11-05 DIAGNOSIS — I5032 Chronic diastolic (congestive) heart failure: Secondary | ICD-10-CM | POA: Insufficient documentation

## 2021-11-05 DIAGNOSIS — I2510 Atherosclerotic heart disease of native coronary artery without angina pectoris: Secondary | ICD-10-CM | POA: Diagnosis not present

## 2021-11-05 DIAGNOSIS — Z7901 Long term (current) use of anticoagulants: Secondary | ICD-10-CM | POA: Insufficient documentation

## 2021-11-05 DIAGNOSIS — I110 Hypertensive heart disease with heart failure: Secondary | ICD-10-CM | POA: Insufficient documentation

## 2021-11-05 DIAGNOSIS — I4892 Unspecified atrial flutter: Secondary | ICD-10-CM

## 2021-11-05 DIAGNOSIS — Z955 Presence of coronary angioplasty implant and graft: Secondary | ICD-10-CM | POA: Insufficient documentation

## 2021-11-05 DIAGNOSIS — E785 Hyperlipidemia, unspecified: Secondary | ICD-10-CM | POA: Insufficient documentation

## 2021-11-05 DIAGNOSIS — R55 Syncope and collapse: Secondary | ICD-10-CM | POA: Insufficient documentation

## 2021-11-05 DIAGNOSIS — I340 Nonrheumatic mitral (valve) insufficiency: Secondary | ICD-10-CM | POA: Insufficient documentation

## 2021-11-05 DIAGNOSIS — I421 Obstructive hypertrophic cardiomyopathy: Secondary | ICD-10-CM | POA: Insufficient documentation

## 2021-11-05 DIAGNOSIS — I4819 Other persistent atrial fibrillation: Secondary | ICD-10-CM | POA: Diagnosis not present

## 2021-11-05 LAB — COMPREHENSIVE METABOLIC PANEL
ALT: 14 U/L (ref 0–44)
AST: 14 U/L — ABNORMAL LOW (ref 15–41)
Albumin: 3.6 g/dL (ref 3.5–5.0)
Alkaline Phosphatase: 79 U/L (ref 38–126)
Anion gap: 10 (ref 5–15)
BUN: 20 mg/dL (ref 8–23)
CO2: 23 mmol/L (ref 22–32)
Calcium: 9.2 mg/dL (ref 8.9–10.3)
Chloride: 103 mmol/L (ref 98–111)
Creatinine, Ser: 1.13 mg/dL — ABNORMAL HIGH (ref 0.44–1.00)
GFR, Estimated: 48 mL/min — ABNORMAL LOW (ref >60)
Glucose, Bld: 98 mg/dL (ref 70–99)
Potassium: 3.9 mmol/L (ref 3.5–5.1)
Sodium: 136 mmol/L (ref 135–145)
Total Bilirubin: 0.7 mg/dL (ref 0.3–1.2)
Total Protein: 6.3 g/dL — ABNORMAL LOW (ref 6.5–8.1)

## 2021-11-05 LAB — CBC
HCT: 40.5 % (ref 36.0–46.0)
Hemoglobin: 12.9 g/dL (ref 12.0–15.0)
MCH: 33 pg (ref 26.0–34.0)
MCHC: 31.9 g/dL (ref 30.0–36.0)
MCV: 103.6 fL — ABNORMAL HIGH (ref 80.0–100.0)
Platelets: 211 10*3/uL (ref 150–400)
RBC: 3.91 MIL/uL (ref 3.87–5.11)
RDW: 15.9 % — ABNORMAL HIGH (ref 11.5–15.5)
WBC: 6.1 10*3/uL (ref 4.0–10.5)
nRBC: 0 % (ref 0.0–0.2)

## 2021-11-05 LAB — BRAIN NATRIURETIC PEPTIDE: B Natriuretic Peptide: 233.6 pg/mL — ABNORMAL HIGH (ref 0.0–100.0)

## 2021-11-05 LAB — TSH: TSH: 1.13 u[IU]/mL (ref 0.350–4.500)

## 2021-11-05 MED ORDER — FUROSEMIDE 20 MG PO TABS: 20.0000 mg | ORAL_TABLET | Freq: Every day | ORAL | 3 refills | Status: DC

## 2021-11-05 MED ORDER — POTASSIUM CHLORIDE ER 10 MEQ PO TBCR: 10.0000 meq | EXTENDED_RELEASE_TABLET | Freq: Every day | ORAL | 3 refills | Status: DC

## 2021-11-05 NOTE — H&P (View-Only) (Signed)
Patient ID: Kim [name], female   DOB: 20-Mar-1935, 87 y.o.   MRN: [id number] PCP: Dr. Sarajane Jews Oncologist: Dr. Marin Olp Cardiology: Dr. Aundra Dubin  87 y.o. with history of CAD, HCM, paroxysmal atrial fibrillation, and mitral regurgitation presents for followup of HCM, CHF, MR.  Patient had breast cancer in 2015 and received treatment involving Herceptin.  During breast cancer treatment, she developed unstable angina and ended up getting a DES to the mid RCA in 3/15.  Patient additionally has a history of HOCM.  This has been recognized on prior echoes.  She has severe asymmetric basal septal hypertrophy and SAM with LVOT gradient peak 58 mmHg on echo in 3/15 along with moderate MR.  Repeat echo in 7/15 showed LVOT gradient down to 30 mmHg on higher beta blocker.  Repeat echo in 11/15 showed no significant LVOT gradient but SAM still present.  Echo (8/16) showed asymmetric septal hypertrophy, No SAM, no significant LVOT gradient.  Echo 4/18 showed EF 55-60%, small LVOT gradient with severe asymmetric septal hypertrophy, mild MR, PASP 32 mmHg.    She was admitted in 1/20 with symptomatic atrial fibrillation with RVR, this was a new diagnosis.  She was started on Eliquis and cardioverted after TEE back to NSR.  TEE showed severe mitral regurgitation that appeared to be due mostly to posterior leaflet prolapse, there was minimal systolic anterior motion.   In 3/20, she had Mitraclip placement. Echo in 6/20 showed EF 60-65%, severe asymmetric septal hypertrophy, normal RV, s/p Mitraclip with mean gradient 3 mmHg and trivial MR.  Echo in 3/21 showed EF 65%, asymmetric septal hypertrophy with LVOT gradient 37 mmHg, SAM of clipped segment of the mitral valve, normal RV, severe LAE, s/p Mitraclip x 2 with mild MR/no MS.   In October she went to [facility] ED on 07/07/20 for chest pain. She was in A fib at that time. Work up negative so she was discharged later that day.   Had cardioversion 08/07/20 --> NSR  Evaluated in the  ED on 01/23/21 for  presyncope on 01/22/21 and syncope 01/23/21. She does not recall tripping or dizziness. Bruising noted on face/arms. No acute findings. She declined cardiology consultation and preferred to be seen in the clinic. She was supposed to wear Zio patch but this was never done.  Carotid dopplers showed no significant disease in 5/22.   Echo in 5/22 showed EF 60-65% with several focal basal septal hypertrophy, there does not appear to be a significant LVOT gradient, s/p Mitraclip with mean gradient 6 mmHg and mild-moderate MR, RV normal, PASP 38 mmHg.   She developed symptomatic atrial fibrillation again and underwent DCCV in 6/22 on amiodarone. Zio monitor in 8/22 showed NSR, short runs of SVT, no AF.  She was admitted in 11/22 after a mechanical fall with a right hip fracture, now s/p repair. She was noted to be in atrial fibrillation during that admission.    She returns for followup of CHF, hypertrophic cardiomyopathy, atrial fibrillation.  She is currently living in a SNF.  She is doing PT for her hip and has started to walk with a walker though she is mainly confined to the wheelchair.  She is not short of breath with exertion though she is not very active.  Rare atypical chest pain.  Main complaint is ankle swelling that has been going on for several weeks.  She is in atypical atrial flutter today.   ECG (personally reviewed): Atrial flutter rate 86   Labs (3/15): K 4.5, creatinine  0.84, LDL 91, HDL 46 Labs (5/15): K 3.9, creatinine 1.1 Labs (12/15): LDL 80, HDL 28, hemoglobin 10.9 Labs (1/16): K 3.7, creatinine 0.8 Labs (2/16): K 3.6, creatinine 1.2, HCT 34.4 Labs (6/16): K 4.1, creatinine 1.24 Labs (7/17): K 4.1, creatinine 1.2, HCT 38.8, LDL 143 Labs (1/18): LDL 142 Labs (5/18): K 4.4, creatinine 1.0 Labs (1/20): K 3.9, creatinine 1.27, LDL 95 Labs (8/20): K 3.8, creatinine 2.06, LFTs normal Labs (12/20): K 4.1, creatinine 1.14 Labs (2/21): LDL 121 Labs (3/21): K 4.4,  creatinine 1.27 Labs (01/23/21): K 3.5 Creatinine 1.2, TSH normal, BNP 385 Labs (5/22): K 4.3, creatinine 1.26, hgb 11.7, LDL 122 Labs (7/22): LFTs normal Labs (8/22): K 4.3, creatinine 1.45 Labs (11/22): K 3.7, creatinine 1.24  PMH: 1. CAD: Unstable angina 3/15 with LHC showing 99% mRCA stenosis, treated with DES to mRCA.  2. Hypothyroidism 3. Diverticulosis 4. HTN 5. H/o TAH/BSO 6. Breast cancer: s/p lumpectomy with lymph node biopsy in 2/15.  4/10 nodes positive.  She was started on docetaxol/carboplatin/Herceptin in 3/15 with plan for 6 cycles chemo.  7. Hypertrophic obstructive cardiomyopathy: Echo (3/15) with severe focal basal septal hypertrophy (22 mm), narrow LV outflow tract with mitral valve SAM and 58 mmHg peak LVOT resting gradient, EF 60-65%, moderate MR, moderate LAE, normal RV, lateral s' 10.4 cm/sec.  Patient says that her 2 grown sons has had echoes to screen for HOCM.  Echo (4/15) with EF 65-70%, severe focal basal septal hypertrophy, LVOT gradient 33 mmHg, SAM was present with moderate MR, normal RV size and systolic function, lateral S' 10.6, GLS -17.5%.  Cardiac MRI (5/15) with EF 65%, moderate asymmetric septal hypertrophy, systolic anterior motion of the mitral valve with moderate MR, there was no delayed enhancement.  Echo (7/15) with EF 65-70%, moderate ASH, peak LVOT gradient 30 mmHg, SAM with moderate MR, moderate to severe LAE.  Echo (11/15) with EF 55-60%, GLS -15%, no significant LVOT gradient, systolic anterior motion of the mitral valve with mild MR, normal RV size and systolic function. Echo (4/14) with EF 55-60%, severe asymmetric septal hypertrophy, LVOT gradient 20 mmHg, mild-moderate MR, GLS -18%.  - Echo (8/16): EF 60-65%, asymmetric septal hypertrophy, no significant LV outflow tract gradient, mild MR.   - Echo (4/18): EF 55-60%, small LVOT gradient with severe asymmetric septal hypertrophy, mild MR, PASP 32 mmHg.   - TEE (1/20): EF 55-60%, moderate-severe  asymmetric septal hypertrophy, no LVOT gradient, normal RV size and systolic function, severe LAE, severe MR with posterior MV leaflet prolapse and minimal SAM.   - Echo (6/20): EF 60-65%, severe asymmetric septal hypertrophy, normal RV, s/p Mitraclip with mean gradient 3 mmHg and trivial MR.   - Echo (3/21): EF 65%, asymmetric septal hypertrophy with LVOT gradient 37 mmHg, SAM of clipped segment of the mitral valve, normal RV, severe LAE, s/p Mitraclip x 2 with mild MR/no MS.  - Echo (5/22): EF 60-65% with several focal basal septal hypertrophy, There was no significant LVOT gradient, s/p Mitraclip with mean gradient 6 mmHg and mild-moderate MR, RV normal, PASP 38 mmHg.  8. Hyperlipidemia: Myalgias with atorvastatin, myalgias with Repatha.  9. Atrial fibrillation: Paroxysmal, first noted in 1/20.  - DCCV 11/21.  - DCCV 6/22 - Zio monitor (8/22): NSR, rare short SVT, no AF.  10. Mitral regurgitation: Appears severe on 1/20 TEE.  Has posterior leaflet prolapse with minimal SAM.  - Mitraclip 3/20.  11. Right hip fracture 11/22 with repair.   SH: Married, 2 children, retired, nonsmoker.  FH: No family history of HOCM or sudden death.  There is a family history of CAD.   ROS: All systems reviewed and negative except as per HPI.    Current Outpatient Medications  Medication Sig Dispense Refill   acetaminophen (TYLENOL) 500 MG tablet Take 1,000 mg by mouth in the morning, at noon, and at bedtime.     amiodarone (PACERONE) 200 MG tablet Take 1 tablet (200 mg total) by mouth daily. 90 tablet 3   cyanocobalamin 1000 MCG tablet Take 1,000 mcg by mouth daily.     diclofenac Sodium (VOLTAREN) 1 % GEL Apply 4 g topically 2 (two) times daily.     diltiazem (CARDIZEM CD) 240 MG 24 hr capsule Take 1 capsule (240 mg total) by mouth daily.     ELIQUIS 5 MG TABS tablet TAKE 1 TABLET BY MOUTH TWICE (2) DAILY 180 tablet 1   exemestane (AROMASIN) 25 MG tablet TAKE 1 TABLET BY MOUTH ONCE DAILY AFTER BREAKFAST  90 tablet 3   ferrous gluconate (FERGON) 324 MG tablet Take 324 mg by mouth daily with lunch.     furosemide (LASIX) 20 MG tablet Take 1 tablet (20 mg total) by mouth daily. 90 tablet 3   HYDROcodone-acetaminophen (NORCO/VICODIN) 5-325 MG tablet Take 1 tablet by mouth every 4 (four) hours as needed for moderate pain.     levothyroxine (SYNTHROID, LEVOTHROID) 137 MCG tablet Take 1 tablet (137 mcg total) by mouth daily before breakfast. 90 tablet 3   methocarbamol (ROBAXIN) 500 MG tablet Take 1 tablet (500 mg total) by mouth every 8 (eight) hours as needed for muscle spasms. 30 tablet 0   metoprolol succinate (TOPROL-XL) 50 MG 24 hr tablet Take 50 mg by mouth daily. Take with or immediately following a meal.     Multiple Vitamin (MULTIVITAMIN WITH MINERALS) TABS tablet Take 1 tablet by mouth daily.     nitroGLYCERIN (NITROSTAT) 0.4 MG SL tablet Place 1 tablet (0.4 mg total) under the tongue every 5 (five) minutes as needed for chest pain. 100 tablet 3   polyethylene glycol (MIRALAX / GLYCOLAX) 17 g packet Take 17 g by mouth daily. 14 each 0   potassium chloride (KLOR-CON) 10 MEQ tablet Take 1 tablet (10 mEq total) by mouth daily. 90 tablet 3   senna (SENOKOT) 8.6 MG TABS tablet Take 1 tablet (8.6 mg total) by mouth 2 (two) times daily.  0   traMADol (ULTRAM) 50 MG tablet Take by mouth 2 (two) times daily as needed.     No current facility-administered medications for this encounter.   BP 132/80    Pulse 85    LMP 10/19/1991    SpO2 98%   Vitals:   11/05/21 1447  BP: 132/80  Pulse: 85  SpO2: 98%   Wt Readings from Last 3 Encounters:  08/22/21 67.9 kg (149 lb 11.1 oz)  07/13/21 65.1 kg (143 lb 9.6 oz)  05/13/21 66.4 kg (146 lb 6.4 oz)   General: NAD Neck: JVP 8 cm, no thyromegaly or thyroid nodule.  Lungs: Clear to auscultation bilaterally with normal respiratory effort. CV: Nondisplaced PMI.  Heart irregular S1/S2, no S3/S4, 1/6 SEM RUSB.  1+ ankle edema.  No carotid bruit.  Normal pedal  pulses.  Abdomen: Soft, nontender, no hepatosplenomegaly, no distention.  Skin: Intact without lesions or rashes.  Neurologic: Alert and oriented x 3.  Psych: Normal affect. Extremities: No clubbing or cyanosis.  HEENT: Normal.   Assessment/Plan: 1. CAD: Status post PCI for unstable angina  in 3/15 with DES to [facility]. No chest pain.   - No ASA, taking Eliquis.    - Unable to tolerate statins or Repatha. 2. Hyperlipidemia: Myalgias with Crestor, Lipitor, and pravastatin.  Myalgias with Repatha.  - She is off Zetia currently, should restart.  3. Hypertrophic obstructive cardiomyopathy: No family history of HOCM or sudden death (interestingly, her husband has HOCM). Cardiac MRI in 5/15 showed no delayed enhancement.  Echo in 3/21 (post-Mitraclip) showed asymmetric septal hypertrophy with LVOT gradient peak 37 mmHg, SAM of the clipped segment of the mitral valve with mild MR, no MS.  Echo in 5/22 showed asymmetric septal hypertrophy but there was no significant LVOT gradient. Minimal murmur on exam  - Continue diltiazem CD 240 mg daily.  - Continue Toprol XL 25 mg bid.   - Not candidate for mavacamten with no significant LVOT gradient detected.  - Patient's daughter still needs echo screening for HCM, her son has been screened.     4. Atrial fibrillation: Noted initially in 1/20, suspect this was triggered by mitral regurgitation and HCM.  She was cardioverted to NSR.  She was on amiodarone for a few months, this was stopped after Mitraclip. In A fib 07/07/20. Had successful cardioversion 08/07/20 and in 6/22.  She was in atrial fibrillation in 11/22 when she fractured her hip and is in atypical atrial flutter today.  - Continue amiodarone 200 mg daily.  Check CMET and TSH today, she will need regular eye exam.  - Continue Eliquis. CBC today.  She has not missed any Eliquis doses.  - I will arrange for DCCV.  We discussed risks/benefits and she agrees to procedure.  5. Chronic diastolic CHF:  Echo in  0/62 with normal EF and normal RV, suspect she still has LVOT gradient though it was not measured on echo today.  She is in atrial flutter today and appears mildly volume overloaded on exam.     - I will have her take Lasix 20 mg daily with KCl 10 daily. BMET today and again in 10 days.  6. Mitral regurgitation: Suspect that mitral regurgitation may have been a trigger for atrial fibrillation.  I think the mechanism of MR was most likely mitral valve prolapse. She has now had Mitraclip placement, echo in 5/22 showed mild-moderate MR, mild MS with mean gradient 6 mmHg.  - Antibiotics with dental work.  7. Syncope: No recurrence since decreasing Toprol XL dose.  ?Orthostatic.    Followup in 1 month with APP.   [name] 11/05/2021

## 2021-11-05 NOTE — Progress Notes (Signed)
Patient ID: Kim [name], female   DOB: Jun 02, 1935, 87 y.o.   MRN: [id number] PCP: Dr. Sarajane Jews Oncologist: Dr. Marin Olp Cardiology: Dr. Aundra Dubin  87 y.o. with history of CAD, HCM, paroxysmal atrial fibrillation, and mitral regurgitation presents for followup of HCM, CHF, MR.  Patient had breast cancer in 2015 and received treatment involving Herceptin.  During breast cancer treatment, she developed unstable angina and ended up getting a DES to the mid RCA in 3/15.  Patient additionally has a history of HOCM.  This has been recognized on prior echoes.  She has severe asymmetric basal septal hypertrophy and SAM with LVOT gradient peak 58 mmHg on echo in 3/15 along with moderate MR.  Repeat echo in 7/15 showed LVOT gradient down to 30 mmHg on higher beta blocker.  Repeat echo in 11/15 showed no significant LVOT gradient but SAM still present.  Echo (8/16) showed asymmetric septal hypertrophy, No SAM, no significant LVOT gradient.  Echo 4/18 showed EF 55-60%, small LVOT gradient with severe asymmetric septal hypertrophy, mild MR, PASP 32 mmHg.    She was admitted in 1/20 with symptomatic atrial fibrillation with RVR, this was a new diagnosis.  She was started on Eliquis and cardioverted after TEE back to NSR.  TEE showed severe mitral regurgitation that appeared to be due mostly to posterior leaflet prolapse, there was minimal systolic anterior motion.   In 3/20, she had Mitraclip placement. Echo in 6/20 showed EF 60-65%, severe asymmetric septal hypertrophy, normal RV, s/p Mitraclip with mean gradient 3 mmHg and trivial MR.  Echo in 3/21 showed EF 65%, asymmetric septal hypertrophy with LVOT gradient 37 mmHg, SAM of clipped segment of the mitral valve, normal RV, severe LAE, s/p Mitraclip x 2 with mild MR/no MS.   In October she went to [facility] ED on 07/07/20 for chest pain. She was in A fib at that time. Work up negative so she was discharged later that day.   Had cardioversion 08/07/20 --> NSR  Evaluated in the  ED on 01/23/21 for  presyncope on 01/22/21 and syncope 01/23/21. She does not recall tripping or dizziness. Bruising noted on face/arms. No acute findings. She declined cardiology consultation and preferred to be seen in the clinic. She was supposed to wear Zio patch but this was never done.  Carotid dopplers showed no significant disease in 5/22.   Echo in 5/22 showed EF 60-65% with several focal basal septal hypertrophy, there does not appear to be a significant LVOT gradient, s/p Mitraclip with mean gradient 6 mmHg and mild-moderate MR, RV normal, PASP 38 mmHg.   She developed symptomatic atrial fibrillation again and underwent DCCV in 6/22 on amiodarone. Zio monitor in 8/22 showed NSR, short runs of SVT, no AF.  She was admitted in 11/22 after a mechanical fall with a right hip fracture, now s/p repair. She was noted to be in atrial fibrillation during that admission.    She returns for followup of CHF, hypertrophic cardiomyopathy, atrial fibrillation.  She is currently living in a SNF.  She is doing PT for her hip and has started to walk with a walker though she is mainly confined to the wheelchair.  She is not short of breath with exertion though she is not very active.  Rare atypical chest pain.  Main complaint is ankle swelling that has been going on for several weeks.  She is in atypical atrial flutter today.   ECG (personally reviewed): Atrial flutter rate 86   Labs (3/15): K 4.5, creatinine  0.84, LDL 91, HDL 46 Labs (5/15): K 3.9, creatinine 1.1 Labs (12/15): LDL 80, HDL 28, hemoglobin 10.9 Labs (1/16): K 3.7, creatinine 0.8 Labs (2/16): K 3.6, creatinine 1.2, HCT 34.4 Labs (6/16): K 4.1, creatinine 1.24 Labs (7/17): K 4.1, creatinine 1.2, HCT 38.8, LDL 143 Labs (1/18): LDL 142 Labs (5/18): K 4.4, creatinine 1.0 Labs (1/20): K 3.9, creatinine 1.27, LDL 95 Labs (8/20): K 3.8, creatinine 2.06, LFTs normal Labs (12/20): K 4.1, creatinine 1.14 Labs (2/21): LDL 121 Labs (3/21): K 4.4,  creatinine 1.27 Labs (01/23/21): K 3.5 Creatinine 1.2, TSH normal, BNP 385 Labs (5/22): K 4.3, creatinine 1.26, hgb 11.7, LDL 122 Labs (7/22): LFTs normal Labs (8/22): K 4.3, creatinine 1.45 Labs (11/22): K 3.7, creatinine 1.24  PMH: 1. CAD: Unstable angina 3/15 with LHC showing 99% mRCA stenosis, treated with DES to mRCA.  2. Hypothyroidism 3. Diverticulosis 4. HTN 5. H/o TAH/BSO 6. Breast cancer: s/p lumpectomy with lymph node biopsy in 2/15.  4/10 nodes positive.  She was started on docetaxol/carboplatin/Herceptin in 3/15 with plan for 6 cycles chemo.  7. Hypertrophic obstructive cardiomyopathy: Echo (3/15) with severe focal basal septal hypertrophy (22 mm), narrow LV outflow tract with mitral valve SAM and 58 mmHg peak LVOT resting gradient, EF 60-65%, moderate MR, moderate LAE, normal RV, lateral s' 10.4 cm/sec.  Patient says that her 2 grown sons has had echoes to screen for HOCM.  Echo (4/15) with EF 65-70%, severe focal basal septal hypertrophy, LVOT gradient 33 mmHg, SAM was present with moderate MR, normal RV size and systolic function, lateral S' 10.6, GLS -17.5%.  Cardiac MRI (5/15) with EF 65%, moderate asymmetric septal hypertrophy, systolic anterior motion of the mitral valve with moderate MR, there was no delayed enhancement.  Echo (7/15) with EF 65-70%, moderate ASH, peak LVOT gradient 30 mmHg, SAM with moderate MR, moderate to severe LAE.  Echo (11/15) with EF 55-60%, GLS -15%, no significant LVOT gradient, systolic anterior motion of the mitral valve with mild MR, normal RV size and systolic function. Echo (4/14) with EF 55-60%, severe asymmetric septal hypertrophy, LVOT gradient 20 mmHg, mild-moderate MR, GLS -18%.  - Echo (8/16): EF 60-65%, asymmetric septal hypertrophy, no significant LV outflow tract gradient, mild MR.   - Echo (4/18): EF 55-60%, small LVOT gradient with severe asymmetric septal hypertrophy, mild MR, PASP 32 mmHg.   - TEE (1/20): EF 55-60%, moderate-severe  asymmetric septal hypertrophy, no LVOT gradient, normal RV size and systolic function, severe LAE, severe MR with posterior MV leaflet prolapse and minimal SAM.   - Echo (6/20): EF 60-65%, severe asymmetric septal hypertrophy, normal RV, s/p Mitraclip with mean gradient 3 mmHg and trivial MR.   - Echo (3/21): EF 65%, asymmetric septal hypertrophy with LVOT gradient 37 mmHg, SAM of clipped segment of the mitral valve, normal RV, severe LAE, s/p Mitraclip x 2 with mild MR/no MS.  - Echo (5/22): EF 60-65% with several focal basal septal hypertrophy, There was no significant LVOT gradient, s/p Mitraclip with mean gradient 6 mmHg and mild-moderate MR, RV normal, PASP 38 mmHg.  8. Hyperlipidemia: Myalgias with atorvastatin, myalgias with Repatha.  9. Atrial fibrillation: Paroxysmal, first noted in 1/20.  - DCCV 11/21.  - DCCV 6/22 - Zio monitor (8/22): NSR, rare short SVT, no AF.  10. Mitral regurgitation: Appears severe on 1/20 TEE.  Has posterior leaflet prolapse with minimal SAM.  - Mitraclip 3/20.  11. Right hip fracture 11/22 with repair.   SH: Married, 2 children, retired, nonsmoker.  FH: No family history of HOCM or sudden death.  There is a family history of CAD.   ROS: All systems reviewed and negative except as per HPI.    Current Outpatient Medications  Medication Sig Dispense Refill   acetaminophen (TYLENOL) 500 MG tablet Take 1,000 mg by mouth in the morning, at noon, and at bedtime.     amiodarone (PACERONE) 200 MG tablet Take 1 tablet (200 mg total) by mouth daily. 90 tablet 3   cyanocobalamin 1000 MCG tablet Take 1,000 mcg by mouth daily.     diclofenac Sodium (VOLTAREN) 1 % GEL Apply 4 g topically 2 (two) times daily.     diltiazem (CARDIZEM CD) 240 MG 24 hr capsule Take 1 capsule (240 mg total) by mouth daily.     ELIQUIS 5 MG TABS tablet TAKE 1 TABLET BY MOUTH TWICE (2) DAILY 180 tablet 1   exemestane (AROMASIN) 25 MG tablet TAKE 1 TABLET BY MOUTH ONCE DAILY AFTER BREAKFAST  90 tablet 3   ferrous gluconate (FERGON) 324 MG tablet Take 324 mg by mouth daily with lunch.     furosemide (LASIX) 20 MG tablet Take 1 tablet (20 mg total) by mouth daily. 90 tablet 3   HYDROcodone-acetaminophen (NORCO/VICODIN) 5-325 MG tablet Take 1 tablet by mouth every 4 (four) hours as needed for moderate pain.     levothyroxine (SYNTHROID, LEVOTHROID) 137 MCG tablet Take 1 tablet (137 mcg total) by mouth daily before breakfast. 90 tablet 3   methocarbamol (ROBAXIN) 500 MG tablet Take 1 tablet (500 mg total) by mouth every 8 (eight) hours as needed for muscle spasms. 30 tablet 0   metoprolol succinate (TOPROL-XL) 50 MG 24 hr tablet Take 50 mg by mouth daily. Take with or immediately following a meal.     Multiple Vitamin (MULTIVITAMIN WITH MINERALS) TABS tablet Take 1 tablet by mouth daily.     nitroGLYCERIN (NITROSTAT) 0.4 MG SL tablet Place 1 tablet (0.4 mg total) under the tongue every 5 (five) minutes as needed for chest pain. 100 tablet 3   polyethylene glycol (MIRALAX / GLYCOLAX) 17 g packet Take 17 g by mouth daily. 14 each 0   potassium chloride (KLOR-CON) 10 MEQ tablet Take 1 tablet (10 mEq total) by mouth daily. 90 tablet 3   senna (SENOKOT) 8.6 MG TABS tablet Take 1 tablet (8.6 mg total) by mouth 2 (two) times daily.  0   traMADol (ULTRAM) 50 MG tablet Take by mouth 2 (two) times daily as needed.     No current facility-administered medications for this encounter.   BP 132/80    Pulse 85    LMP 10/19/1991    SpO2 98%   Vitals:   11/05/21 1447  BP: 132/80  Pulse: 85  SpO2: 98%   Wt Readings from Last 3 Encounters:  08/22/21 67.9 kg (149 lb 11.1 oz)  07/13/21 65.1 kg (143 lb 9.6 oz)  05/13/21 66.4 kg (146 lb 6.4 oz)   General: NAD Neck: JVP 8 cm, no thyromegaly or thyroid nodule.  Lungs: Clear to auscultation bilaterally with normal respiratory effort. CV: Nondisplaced PMI.  Heart irregular S1/S2, no S3/S4, 1/6 SEM RUSB.  1+ ankle edema.  No carotid bruit.  Normal pedal  pulses.  Abdomen: Soft, nontender, no hepatosplenomegaly, no distention.  Skin: Intact without lesions or rashes.  Neurologic: Alert and oriented x 3.  Psych: Normal affect. Extremities: No clubbing or cyanosis.  HEENT: Normal.   Assessment/Plan: 1. CAD: Status post PCI for unstable angina  in 3/15 with DES to [facility]. No chest pain.   - No ASA, taking Eliquis.    - Unable to tolerate statins or Repatha. 2. Hyperlipidemia: Myalgias with Crestor, Lipitor, and pravastatin.  Myalgias with Repatha.  - She is off Zetia currently, should restart.  3. Hypertrophic obstructive cardiomyopathy: No family history of HOCM or sudden death (interestingly, her husband has HOCM). Cardiac MRI in 5/15 showed no delayed enhancement.  Echo in 3/21 (post-Mitraclip) showed asymmetric septal hypertrophy with LVOT gradient peak 37 mmHg, SAM of the clipped segment of the mitral valve with mild MR, no MS.  Echo in 5/22 showed asymmetric septal hypertrophy but there was no significant LVOT gradient. Minimal murmur on exam  - Continue diltiazem CD 240 mg daily.  - Continue Toprol XL 25 mg bid.   - Not candidate for mavacamten with no significant LVOT gradient detected.  - Patient's daughter still needs echo screening for HCM, her son has been screened.     4. Atrial fibrillation: Noted initially in 1/20, suspect this was triggered by mitral regurgitation and HCM.  She was cardioverted to NSR.  She was on amiodarone for a few months, this was stopped after Mitraclip. In A fib 07/07/20. Had successful cardioversion 08/07/20 and in 6/22.  She was in atrial fibrillation in 11/22 when she fractured her hip and is in atypical atrial flutter today.  - Continue amiodarone 200 mg daily.  Check CMET and TSH today, she will need regular eye exam.  - Continue Eliquis. CBC today.  She has not missed any Eliquis doses.  - I will arrange for DCCV.  We discussed risks/benefits and she agrees to procedure.  5. Chronic diastolic CHF:  Echo in  2/87 with normal EF and normal RV, suspect she still has LVOT gradient though it was not measured on echo today.  She is in atrial flutter today and appears mildly volume overloaded on exam.     - I will have her take Lasix 20 mg daily with KCl 10 daily. BMET today and again in 10 days.  6. Mitral regurgitation: Suspect that mitral regurgitation may have been a trigger for atrial fibrillation.  I think the mechanism of MR was most likely mitral valve prolapse. She has now had Mitraclip placement, echo in 5/22 showed mild-moderate MR, mild MS with mean gradient 6 mmHg.  - Antibiotics with dental work.  7. Syncope: No recurrence since decreasing Toprol XL dose.  ?Orthostatic.    Followup in 1 month with APP.   [name] 11/05/2021

## 2021-11-05 NOTE — Patient Instructions (Addendum)
FOLLOWING ORDERS SENT BACK TO [facility]:  Start Furosemide 20 mg Daily  Start Potassium (klor con) 10 meq Daily  Labs done today, we will call you for abnormal results  Your physician recommends that you return for lab work in: 1 week (this can be down at your facility)  Your physician has recommended that you have a Cardioversion (DCCV). Electrical Cardioversion uses a jolt of electricity to your heart either through paddles or wired patches attached to your chest. This is a controlled, usually prescheduled, procedure. Defibrillation is done under light anesthesia in the hospital, and you usually go home the day of the procedure. This is done to get your heart back into a normal rhythm. You are not awake for the procedure. Please see the instruction sheet given to you today. SEE INSTRUCTIONS BELOW  Your physician recommends that you schedule a follow-up appointment in: 4-6 weeks (Thursday 12/03/21 at 1:30 PM)   Cardioversion Instructions:  You are scheduled for a Cardioversion on Thursday 11/26/21 with Dr. Aundra Dubin.    Please arrive at the [facility] (Main Entrance A) at [facility]: [address] [place], [place] [address] at 8:30 am  DIET: Nothing to eat or drink after midnight except a sip of water with medications (see medication instructions below)  FYI: For your safety, and to allow [place] to monitor your vital signs accurately during the surgery/procedure we request that   if you have artificial nails, gel coating, SNS etc. Please have those removed prior to your surgery/procedure. Not having the nail coverings /polish removed may result in cancellation or delay of your surgery/procedure.   Medication Instructions:  Hold Furosemide 2/23 AM  Continue your anticoagulant: Eliquis PLEASE DO NOT MISS ANY DOSES OF THIS  You must have a responsible person to drive you home and stay in the waiting area during your procedure. Failure to do so could result in  cancellation.  Bring your insurance cards.  *Special Note: Every effort is made to have your procedure done on time. Occasionally there are emergencies that occur at the hospital that may cause delays. Please be patient if a delay does occur.

## 2021-11-06 DIAGNOSIS — M6281 Muscle weakness (generalized): Secondary | ICD-10-CM | POA: Diagnosis not present

## 2021-11-06 DIAGNOSIS — E1142 Type 2 diabetes mellitus with diabetic polyneuropathy: Secondary | ICD-10-CM | POA: Diagnosis not present

## 2021-11-06 DIAGNOSIS — S72109D Unspecified trochanteric fracture of unspecified femur, subsequent encounter for closed fracture with routine healing: Secondary | ICD-10-CM | POA: Diagnosis not present

## 2021-11-09 ENCOUNTER — Other Ambulatory Visit: Payer: Self-pay

## 2021-11-09 DIAGNOSIS — E1142 Type 2 diabetes mellitus with diabetic polyneuropathy: Secondary | ICD-10-CM | POA: Diagnosis not present

## 2021-11-09 DIAGNOSIS — M6281 Muscle weakness (generalized): Secondary | ICD-10-CM | POA: Diagnosis not present

## 2021-11-09 DIAGNOSIS — D500 Iron deficiency anemia secondary to blood loss (chronic): Secondary | ICD-10-CM

## 2021-11-09 DIAGNOSIS — R911 Solitary pulmonary nodule: Secondary | ICD-10-CM

## 2021-11-09 DIAGNOSIS — S72109D Unspecified trochanteric fracture of unspecified femur, subsequent encounter for closed fracture with routine healing: Secondary | ICD-10-CM | POA: Diagnosis not present

## 2021-11-10 ENCOUNTER — Telehealth (HOSPITAL_COMMUNITY): Payer: Self-pay | Admitting: Family Medicine

## 2021-11-10 ENCOUNTER — Inpatient Hospital Stay: Payer: Medicare PPO | Attending: Hematology & Oncology

## 2021-11-10 ENCOUNTER — Inpatient Hospital Stay: Payer: Medicare PPO | Admitting: Hematology & Oncology

## 2021-11-10 ENCOUNTER — Telehealth: Payer: Self-pay | Admitting: Radiation Oncology

## 2021-11-10 ENCOUNTER — Inpatient Hospital Stay: Payer: Medicare PPO

## 2021-11-10 ENCOUNTER — Encounter: Payer: Self-pay | Admitting: Hematology & Oncology

## 2021-11-10 ENCOUNTER — Other Ambulatory Visit: Payer: Self-pay

## 2021-11-10 VITALS — BP 109/92 | HR 79 | Temp 98.3°F | Resp 18 | Ht 66.0 in | Wt 138.5 lb

## 2021-11-10 DIAGNOSIS — C779 Secondary and unspecified malignant neoplasm of lymph node, unspecified: Secondary | ICD-10-CM | POA: Insufficient documentation

## 2021-11-10 DIAGNOSIS — R911 Solitary pulmonary nodule: Secondary | ICD-10-CM

## 2021-11-10 DIAGNOSIS — M5440 Lumbago with sciatica, unspecified side: Secondary | ICD-10-CM

## 2021-11-10 DIAGNOSIS — Z170 Estrogen receptor positive status [ER+]: Secondary | ICD-10-CM | POA: Insufficient documentation

## 2021-11-10 DIAGNOSIS — C50412 Malignant neoplasm of upper-outer quadrant of left female breast: Secondary | ICD-10-CM | POA: Diagnosis not present

## 2021-11-10 DIAGNOSIS — M549 Dorsalgia, unspecified: Secondary | ICD-10-CM | POA: Diagnosis not present

## 2021-11-10 DIAGNOSIS — C50912 Malignant neoplasm of unspecified site of left female breast: Secondary | ICD-10-CM | POA: Insufficient documentation

## 2021-11-10 DIAGNOSIS — D500 Iron deficiency anemia secondary to blood loss (chronic): Secondary | ICD-10-CM | POA: Diagnosis not present

## 2021-11-10 LAB — CMP (CANCER CENTER ONLY)
ALT: 12 U/L (ref 0–44)
AST: 11 U/L — ABNORMAL LOW (ref 15–41)
Albumin: 3.9 g/dL (ref 3.5–5.0)
Alkaline Phosphatase: 94 U/L (ref 38–126)
Anion gap: 10 (ref 5–15)
BUN: 23 mg/dL (ref 8–23)
CO2: 27 mmol/L (ref 22–32)
Calcium: 9.4 mg/dL (ref 8.9–10.3)
Chloride: 99 mmol/L (ref 98–111)
Creatinine: 1.07 mg/dL — ABNORMAL HIGH (ref 0.44–1.00)
GFR, Estimated: 51 mL/min — ABNORMAL LOW (ref >60)
Glucose, Bld: 106 mg/dL — ABNORMAL HIGH (ref 70–99)
Potassium: 4 mmol/L (ref 3.5–5.1)
Sodium: 136 mmol/L (ref 135–145)
Total Bilirubin: 0.6 mg/dL (ref 0.3–1.2)
Total Protein: 6.3 g/dL — ABNORMAL LOW (ref 6.5–8.1)

## 2021-11-10 LAB — CBC WITH DIFFERENTIAL (CANCER CENTER ONLY)
Abs Immature Granulocytes: 0.03 10*3/uL (ref 0.00–0.07)
Basophils Absolute: 0 10*3/uL (ref 0.0–0.1)
Basophils Relative: 1 %
Eosinophils Absolute: 0.1 10*3/uL (ref 0.0–0.5)
Eosinophils Relative: 2 %
HCT: 40.2 % (ref 36.0–46.0)
Hemoglobin: 13 g/dL (ref 12.0–15.0)
Immature Granulocytes: 1 %
Lymphocytes Relative: 13 %
Lymphs Abs: 0.7 10*3/uL (ref 0.7–4.0)
MCH: 34.2 pg — ABNORMAL HIGH (ref 26.0–34.0)
MCHC: 32.3 g/dL (ref 30.0–36.0)
MCV: 105.8 fL — ABNORMAL HIGH (ref 80.0–100.0)
Monocytes Absolute: 0.5 10*3/uL (ref 0.1–1.0)
Monocytes Relative: 9 %
Neutro Abs: 3.9 10*3/uL (ref 1.7–7.7)
Neutrophils Relative %: 74 %
Platelet Count: 194 10*3/uL (ref 150–400)
RBC: 3.8 MIL/uL — ABNORMAL LOW (ref 3.87–5.11)
RDW: 15.6 % — ABNORMAL HIGH (ref 11.5–15.5)
WBC Count: 5.2 10*3/uL (ref 4.0–10.5)
nRBC: 0 % (ref 0.0–0.2)

## 2021-11-10 LAB — IRON AND IRON BINDING CAPACITY (CC-WL,HP ONLY)
Iron: 78 ug/dL (ref 28–170)
Saturation Ratios: 24 % (ref 10.4–31.8)
TIBC: 328 ug/dL (ref 250–450)
UIBC: 250 ug/dL (ref 148–442)

## 2021-11-10 LAB — FERRITIN: Ferritin: 175 ng/mL (ref 11–307)

## 2021-11-10 MED ORDER — MORPHINE SULFATE 2 MG/ML IJ SOLN
2.0000 mg | Freq: Once | INTRAMUSCULAR | Status: DC
  Filled 2021-11-10: qty 1

## 2021-11-10 MED ORDER — MORPHINE SULFATE (PF) 2 MG/ML IV SOLN: 2.0000 mg | Freq: Once | INTRAVENOUS | Status: DC

## 2021-11-10 MED ORDER — MORPHINE SULFATE 2 MG/ML IJ SOLN
2.0000 mg | Freq: Once | INTRAMUSCULAR | Status: AC
  Administered 2021-11-10: 2 mg via SUBCUTANEOUS
  Filled 2021-11-10: qty 1

## 2021-11-10 NOTE — Patient Instructions (Signed)
Pain Medicine Instructions You may need pain medicine after an injury or illness. There are many types of pain medicine. Two common types of pain medicine are: Non-opioid pain medicines. These include: Over-the-counter (OTC) medicines such as acetaminophen or non-steroidal anti-inflammatory medicines (NSAIDS). Prescription medicines such as gabapentin or pregabalin. Opioid prescription pain medicines. These include: Hydrocodone. Oxycodone. Tramadol. Pain medicine may be prescribed to relieve most or all of your pain. It may not be possible to make all of your pain go away, but you should be comfortable enough to move, breathe, and do normal activities. How can pain medicines affect me? Pain medicines can cause side effects such as: Nausea. Vomiting. Abdominal pain. Opioid pain medicines can cause additional side effects, such as: Constipation. Drowsiness. Confusion. Difficulty breathing (respiratory depression). Dependence and addiction (opioid use disorder). Using opioid pain medicines for longer than 3 days increases your risk of these side effects. Taking opioid pain medicine for a long period of time can affect your ability to do daily tasks. It also puts you at risk for: Motor vehicle accidents. Depression. Suicide. Heart attack. If they are not taken correctly, non-opioid and opioid medicines may also put you at risk for: Liver problems. Kidney problems. Overdose. This is taking too much of the medicine. It can sometimes lead to death. What actions can I take to lower my risk of problems? Know your pain treatment plan To manage your pain successfully, you and your health care provider need to understand each other and work together. To do this: Discuss the goals of your treatment, including how much pain you might expect to have and how you will manage the pain. Ask your health care provider to refer you to one or more specialists who can help you manage pain in other ways.  Some other ways of managing pain include physical therapy, exercise, massage, or biofeedback. Review the risks and benefits of taking pain medicines for your condition. Be honest about the amount of medicines you take, and about any drugs or alcohol you use. Get pain medicine prescriptions from only one health care provider. Keep all follow-up visits. This is important. Take your medicine as directed  Take your pain medicine exactly as told by your health care provider. Take it only when you need it. If your pain gets less severe, you may take less than your prescribed dose if your health care provider approves. If you are not having pain, do nottake pain medicine unless your health care provider tells you to take it. If your pain medicine contains acetaminophen, do not take any other acetaminophen while taking this medicine. Acetaminophen is found in many over-the-counter and prescription medicines. An overdose of acetaminophen can result in severe liver damage. If your pain is severe, do nottry to treat it yourself by taking more pills than instructed on your prescription. Contact your health care provider for help. Write down the times when you take your pain medicine. It is easy to become confused while on pain medicine. Writing the time can help you avoid overdose. Take other over-the-counter or prescription medicines only as told by your health care provider. Restrict your activity as directed While you are taking prescription pain medicine, and for 8 hours after your last dose, follow these instructions: Do not drive. Do not use machinery or power tools. Do not sign legal documents. Do not drink alcohol. Do not take sleeping pills. Do not supervise children by yourself. Do not do activities that require climbing or being in high places. Do  not go to a lake, river, ocean, spa, or swimming pool unless an adult is nearby who can monitor and help you.  Keep pets and people safe Keep pain  medicine in a locked cabinet, or in a secure area where children and pets cannot reach it. Never share your prescription pain medicine with anyone. Do not save any leftover pills. If you have leftover medicine, you can: Bring the medicine to a prescription take-back program. This is usually offered by the county or Event organiser. Bring it to a pharmacy that has a drug disposal container. Throw it out in the trash. Check the label or package insert of your medicine to see whether this is safe to do. If it is safe to throw it out, remove the medicine from the container and mix it with material that makes it unusable, such as pet waste, before putting it in the trash. Flush it down the toilet only if this is safe to do. To find out, check the label or package insert of your medicine. Information is also available at the [place] Food and Drug Administration website: TruckOr.si. Treat or prevent constipation Taking pain medicines increases your risk for constipation. To prevent or treat this problem: Drink enough water to keep your urine pale yellow. Take over-the-counter or prescription medicines. You may need to take stool softeners. Eat foods that are high in fiber, such as beans, whole grains, and fresh fruits and vegetables. Limit foods that are high in fat and processed sugars, such as fried or sweet foods. Contact a health care provider if: Your medicine is not relieving your pain. You have a rash. You have nausea and vomiting. You feel depressed. Get help right away if: You have difficulty breathing. Your breathing is slower or more shallow than normal. You feel confused. You are too sleepy or you have difficulty staying awake. Your skin or lips turn pale or bluish in color. Your tongue swells. You have thoughts of harming yourself or harming others. These symptoms may represent a serious problem that is an emergency. Do not wait to see if the symptoms will go away. Get medical help right  away. Call your local emergency services (911 in the [place] Do not drive yourself to the hospital. If you ever feel like you may hurt yourself or others, or have thoughts about taking your own life, get help right away. Go to your nearest emergency department or: Call your local emergency services (911 in the [place] Call the [facility] 412-877-0523 in the [place] Call a suicide crisis helpline, such as the [place] at 708-387-1497 or 988 in the [place]. This is open 24 hours a day in the [place] Text the Crisis Text Line at (515) 090-0768 (in the [place].). Summary It is important to follow instructions from your health care provider when you are taking prescription pain medicines. Pain medicine can help reduce pain but may also cause side effects. Make sure you understand the risks and benefits of taking pain medicine for your condition. Take steps to lower your risk of problems by taking medicine exactly as told, restricting activity, keeping others safe, preventing constipation, and having a pain treatment plan. This information is not intended to replace advice given to you by your health care provider. Make sure you discuss any questions you have with your health care provider. Document Revised: 04/15/2021 Document Reviewed: 01/28/2021 Elsevier Patient Education  [place].

## 2021-11-10 NOTE — Progress Notes (Signed)
Hematology and Oncology Follow Up Visit  Kim [name] [id number] 1935/02/26 87 y.o. 11/10/2021   Principle Diagnosis:  Stage IIA (T1N1M0) adenocarcinoma of the left breast-triple positive Iron deficiency anemia secondary to blood loss Solitary left upper lobe nodule - hypermetabolic on PET scan  Current Therapy:   Status post cycle 4 of Abraxane/Herceptin Maintenance Herceptin q 3wk - finish 03/25/2015 Zometa 4 mg IV q 6 months - due in 03/2022  Aromasin 25 mg by mouth daily  - start on 01/10/2017  Feraheme as needed-dose given today SBRT-to be done in 11/2021     Interim History:  [name] is back for followup.  She is not having a good day at all.  She is having a lot of back pain.  She has a lot of arthritis in the back.  She has had back surgery before.  She is on tramadol at the nursing home.  She did not take the tramadol this morning.  We did go ahead and give her 2 mg of morphine subcutaneously for this.  When we had last seen her, there was concern about a left upper lobe nodule.  We did do a PET scan on her.  The PET scan did not show any evidence of metastatic disease.  She had a solitary 11 mm left upper lobe nodule that was mildly hypermetabolic.  The radiologist felt that this was likely a primary bronchogenic carcinoma.  Because of her overall health condition, particularly the dementia, I really do not think we have to start doing invasive procedures on her to make a diagnosis.  I do not think we have to do a bronchoscopy or a transcutaneous biopsy.  I think that the best way to try to treat this is with radiosurgery.  I spoke with Dr. Sondra Come of Radiation Oncology.  He will get a hold of Kim [name] daughter and get everything set up.  Otherwise, she seems to be managing okay.  She has a dementia.  She has a hard time hearing today.  Her appetite is okay.  She may have lost a little bit of weight..  She has had no cough or shortness of breath.  She has had no chest  pain.  Overall, her performance status is probably ECOG 2-3.   Medications:  Current Outpatient Medications:    acetaminophen (TYLENOL) 500 MG tablet, Take 1,000 mg by mouth in the morning, at noon, and at bedtime., Disp: , Rfl:    amiodarone (PACERONE) 200 MG tablet, Take 1 tablet (200 mg total) by mouth daily., Disp: 90 tablet, Rfl: 3   cyanocobalamin 1000 MCG tablet, Take 1,000 mcg by mouth daily., Disp: , Rfl:    diclofenac Sodium (VOLTAREN) 1 % GEL, Apply 4 g topically 2 (two) times daily., Disp: , Rfl:    diltiazem (CARDIZEM CD) 240 MG 24 hr capsule, Take 1 capsule (240 mg total) by mouth daily., Disp: , Rfl:    ELIQUIS 5 MG TABS tablet, TAKE 1 TABLET BY MOUTH TWICE (2) DAILY, Disp: 180 tablet, Rfl: 1   exemestane (AROMASIN) 25 MG tablet, TAKE 1 TABLET BY MOUTH ONCE DAILY AFTER BREAKFAST, Disp: 90 tablet, Rfl: 3   ferrous gluconate (FERGON) 324 MG tablet, Take 324 mg by mouth daily with lunch., Disp: , Rfl:    furosemide (LASIX) 20 MG tablet, Take 1 tablet (20 mg total) by mouth daily., Disp: 90 tablet, Rfl: 3   GNP VITAMIN B-12 1000 MCG TBCR, Take 1 tablet by mouth daily., Disp: ,  Rfl:    HYDROcodone-acetaminophen (NORCO/VICODIN) 5-325 MG tablet, Take 1 tablet by mouth every 4 (four) hours as needed for moderate pain., Disp: , Rfl:    levothyroxine (SYNTHROID) 137 MCG tablet, Take 1 tablet by mouth daily., Disp: , Rfl:    methocarbamol (ROBAXIN) 500 MG tablet, Take 1 tablet (500 mg total) by mouth every 8 (eight) hours as needed for muscle spasms., Disp: 30 tablet, Rfl: 0   metoprolol succinate (TOPROL-XL) 50 MG 24 hr tablet, Take 50 mg by mouth daily. Take with or immediately following a meal., Disp: , Rfl:    Multiple Vitamin (MULTIVITAMIN WITH MINERALS) TABS tablet, Take 1 tablet by mouth daily., Disp: , Rfl:    polyethylene glycol (MIRALAX / GLYCOLAX) 17 g packet, Take 17 g by mouth daily., Disp: 14 each, Rfl: 0   potassium chloride (KLOR-CON) 10 MEQ tablet, Take 1 tablet (10 mEq  total) by mouth daily., Disp: 90 tablet, Rfl: 3   traMADol (ULTRAM) 50 MG tablet, Take by mouth 2 (two) times daily as needed., Disp: , Rfl:    triamcinolone cream (KENALOG) 0.1 %, triamcinolone acetonide 0.1 % topical cream, Disp: , Rfl:    diltiazem (TIAZAC) 240 MG 24 hr capsule, Take 240 mg by mouth daily., Disp: , Rfl:    donepezil (ARICEPT) 5 MG tablet, half for 2 weeks then stop, Disp: , Rfl:    DULoxetine (CYMBALTA) 30 MG capsule, duloxetine 30 mg capsule,delayed release, Disp: , Rfl:    meloxicam (MOBIC) 7.5 MG tablet, meloxicam 7.5 mg tablet (Patient not taking: Reported on 11/10/2021), Disp: , Rfl:    nitroGLYCERIN (NITROSTAT) 0.4 MG SL tablet, Place 1 tablet (0.4 mg total) under the tongue every 5 (five) minutes as needed for chest pain. (Patient not taking: Reported on 11/10/2021), Disp: 100 tablet, Rfl: 3   omeprazole (PRILOSEC) 40 MG capsule, omeprazole 40 mg capsule,delayed release, Disp: , Rfl:    senna (SENOKOT) 8.6 MG TABS tablet, Take 1 tablet (8.6 mg total) by mouth 2 (two) times daily., Disp: , Rfl: 0 No current facility-administered medications for this visit.  Facility-Administered Medications Ordered in Other Visits:    morphine 2 MG/ML injection 2 mg, 2 mg, Subcutaneous, Once, Kim [name], Kim Cobb, MD  Allergies:  Allergies  Allergen Reactions   Penicillins Anaphylaxis and Swelling    FACIAL SWELLING PATIENT HAS HAD A PCN REACTION WITH IMMEDIATE RASH, FACIAL/TONGUE/THROAT SWELLING, SOB, OR LIGHTHEADEDNESS WITH HYPOTENSION:  #  #  YES  #  #  Has patient had a PCN reaction causing severe rash involving mucus membranes or skin necrosis: No Has patient had a PCN reaction that required hospitalization: No Has patient had a PCN reaction occurring within the last 10 years: No   Propoxyphene N-Acetaminophen Swelling    tongue swelling   Evolocumab Other (See Comments)    Myalgias, fatigue, some itching and burning on her feet,  Arthralgia (Joint Pain)   Levaquin [Levofloxacin]  Hives   Statins Other (See Comments)    Joint pain   Duloxetine Other (See Comments)   Cortisone Rash   Myrbetriq [Mirabegron] Rash    Rash on face   Pseudoephedrine Other (See Comments)    Facial flushing    Past Medical History, Surgical history, Social history, and Family History were reviewed and updated.  Review of Systems: Review of Systems  Constitutional: Negative.   HENT: Negative.    Eyes: Negative.   Respiratory: Negative.    Cardiovascular: Negative.   Gastrointestinal: Negative.   Genitourinary: Negative.  Musculoskeletal:  Positive for back pain and joint pain.  Skin: Negative.   Neurological:  Positive for focal weakness.  Endo/Heme/Allergies: Negative.   Psychiatric/Behavioral: Negative.      Physical Exam:  height is 5\' 6"  (1.676 m) and weight is 138 lb 8 oz (62.8 kg). Her oral temperature is 98.3 F (36.8 C). Her blood pressure is 109/92 (abnormal) and her pulse is 79. Her respiration is 18 and oxygen saturation is 99%.   Physical Exam Vitals reviewed.  HENT:     Head: Normocephalic and atraumatic.  Eyes:     Pupils: Pupils are equal, round, and reactive to light.  Cardiovascular:     Rate and Rhythm: Normal rate and regular rhythm.     Heart sounds: Normal heart sounds.     Comments: Cardiac exam shows a regular rate and rhythm.  She has a 2/6 systolic ejection murmur. Pulmonary:     Effort: Pulmonary effort is normal.     Breath sounds: Normal breath sounds.  Abdominal:     General: Bowel sounds are normal.     Palpations: Abdomen is soft.  Musculoskeletal:        General: No tenderness or deformity. Normal range of motion.     Cervical back: Normal range of motion.  Lymphadenopathy:     Cervical: No cervical adenopathy.  Skin:    General: Skin is warm and dry.     Findings: No erythema or rash.  Neurological:     Mental Status: She is alert and oriented to person, place, and time.  Psychiatric:        Behavior: Behavior normal.         Thought Content: Thought content normal.        Judgment: Judgment normal.    Lab Results  Component Value Date   WBC 5.2 11/10/2021   HGB 13.0 11/10/2021   HCT 40.2 11/10/2021   MCV 105.8 (H) 11/10/2021   PLT 194 11/10/2021     Chemistry      Component Value Date/Time   NA 136 11/10/2021 0913   NA 140 06/09/2017 0758   NA 141 10/11/2016 0844   K 4.0 11/10/2021 0913   K 4.5 06/09/2017 0758   K 4.1 10/11/2016 0844   CL 99 11/10/2021 0913   CL 106 06/09/2017 0758   CO2 27 11/10/2021 0913   CO2 28 06/09/2017 0758   CO2 25 10/11/2016 0844   BUN 23 11/10/2021 0913   BUN 17 06/09/2017 0758   BUN 20.3 10/11/2016 0844   CREATININE 1.07 (H) 11/10/2021 0913   CREATININE 1.2 06/09/2017 0758   CREATININE 1.1 10/11/2016 0844      Component Value Date/Time   CALCIUM 9.4 11/10/2021 0913   CALCIUM 9.1 06/09/2017 0758   CALCIUM 9.5 10/11/2016 0844   ALKPHOS 94 11/10/2021 0913   ALKPHOS 49 06/09/2017 0758   ALKPHOS 52 10/11/2016 0844   AST 11 (L) 11/10/2021 0913   AST 13 10/11/2016 0844   ALT 12 11/10/2021 0913   ALT 19 06/09/2017 0758   ALT 10 10/11/2016 0844   BILITOT 0.6 11/10/2021 0913   BILITOT 0.83 10/11/2016 0844         Impression and Plan: Ms. Claytor is 87 year-old white female with stage IIA ductal carcinoma of the left breast. She had 4 positive lymph nodes. Her breast cancer was triple positive. She completed her adjuvant chemotherapy. She completed this in July of 2015.  She then went on to complete adjuvant Herceptin  in June 2016.  Again, this looks like this might be a primary bronchogenic carcinoma.  She never smoked.  However, I suspect this probably is can be a low-grade adenocarcinoma.  The PET scan is quite helpful for the fact that there is no obvious metastatic disease that would suggest breast cancer.  We will treat the nodule.  She will get radiosurgery.  I would wait about 3 months after she completes her radiosurgery before I would do any type  of follow-up to see how effective treatment was.  Again we did give her the morphine for her back pain.  This was should hopefully help her with comfort.  I will like to see her back probably in about 6 weeks.     [name], MD 2/7/202311:24 AM

## 2021-11-11 DIAGNOSIS — E1142 Type 2 diabetes mellitus with diabetic polyneuropathy: Secondary | ICD-10-CM | POA: Diagnosis not present

## 2021-11-11 DIAGNOSIS — M6281 Muscle weakness (generalized): Secondary | ICD-10-CM | POA: Diagnosis not present

## 2021-11-11 DIAGNOSIS — S72109D Unspecified trochanteric fracture of unspecified femur, subsequent encounter for closed fracture with routine healing: Secondary | ICD-10-CM | POA: Diagnosis not present

## 2021-11-12 DIAGNOSIS — E1142 Type 2 diabetes mellitus with diabetic polyneuropathy: Secondary | ICD-10-CM | POA: Diagnosis not present

## 2021-11-12 DIAGNOSIS — M6281 Muscle weakness (generalized): Secondary | ICD-10-CM | POA: Diagnosis not present

## 2021-11-12 DIAGNOSIS — S72109D Unspecified trochanteric fracture of unspecified femur, subsequent encounter for closed fracture with routine healing: Secondary | ICD-10-CM | POA: Diagnosis not present

## 2021-11-13 ENCOUNTER — Encounter (HOSPITAL_COMMUNITY): Payer: Medicare PPO | Admitting: Cardiology

## 2021-11-16 DIAGNOSIS — S72109D Unspecified trochanteric fracture of unspecified femur, subsequent encounter for closed fracture with routine healing: Secondary | ICD-10-CM | POA: Diagnosis not present

## 2021-11-16 DIAGNOSIS — M6281 Muscle weakness (generalized): Secondary | ICD-10-CM | POA: Diagnosis not present

## 2021-11-16 DIAGNOSIS — E1142 Type 2 diabetes mellitus with diabetic polyneuropathy: Secondary | ICD-10-CM | POA: Diagnosis not present

## 2021-11-18 ENCOUNTER — Encounter (HOSPITAL_COMMUNITY): Payer: Self-pay | Admitting: Cardiology

## 2021-11-18 DIAGNOSIS — M6281 Muscle weakness (generalized): Secondary | ICD-10-CM | POA: Diagnosis not present

## 2021-11-18 DIAGNOSIS — E1142 Type 2 diabetes mellitus with diabetic polyneuropathy: Secondary | ICD-10-CM | POA: Diagnosis not present

## 2021-11-18 DIAGNOSIS — S72109D Unspecified trochanteric fracture of unspecified femur, subsequent encounter for closed fracture with routine healing: Secondary | ICD-10-CM | POA: Diagnosis not present

## 2021-11-18 NOTE — Progress Notes (Signed)
Attempted to obtain medical history via telephone, unable to reach at this time. I left a voicemail to return pre surgical testing department's phone call.  

## 2021-11-20 DIAGNOSIS — E1142 Type 2 diabetes mellitus with diabetic polyneuropathy: Secondary | ICD-10-CM | POA: Diagnosis not present

## 2021-11-20 DIAGNOSIS — S72109D Unspecified trochanteric fracture of unspecified femur, subsequent encounter for closed fracture with routine healing: Secondary | ICD-10-CM | POA: Diagnosis not present

## 2021-11-20 DIAGNOSIS — M6281 Muscle weakness (generalized): Secondary | ICD-10-CM | POA: Diagnosis not present

## 2021-11-23 DIAGNOSIS — S72109D Unspecified trochanteric fracture of unspecified femur, subsequent encounter for closed fracture with routine healing: Secondary | ICD-10-CM | POA: Diagnosis not present

## 2021-11-23 DIAGNOSIS — M6281 Muscle weakness (generalized): Secondary | ICD-10-CM | POA: Diagnosis not present

## 2021-11-23 DIAGNOSIS — E1142 Type 2 diabetes mellitus with diabetic polyneuropathy: Secondary | ICD-10-CM | POA: Diagnosis not present

## 2021-11-24 DIAGNOSIS — E1142 Type 2 diabetes mellitus with diabetic polyneuropathy: Secondary | ICD-10-CM | POA: Diagnosis not present

## 2021-11-24 DIAGNOSIS — M6281 Muscle weakness (generalized): Secondary | ICD-10-CM | POA: Diagnosis not present

## 2021-11-24 DIAGNOSIS — S72109D Unspecified trochanteric fracture of unspecified femur, subsequent encounter for closed fracture with routine healing: Secondary | ICD-10-CM | POA: Diagnosis not present

## 2021-11-25 ENCOUNTER — Other Ambulatory Visit (HOSPITAL_COMMUNITY): Payer: Self-pay | Admitting: Adult Health

## 2021-11-25 ENCOUNTER — Ambulatory Visit: Payer: Medicare PPO | Admitting: Radiation Oncology

## 2021-11-25 ENCOUNTER — Ambulatory Visit: Payer: Medicare PPO

## 2021-11-25 ENCOUNTER — Other Ambulatory Visit (HOSPITAL_COMMUNITY): Payer: Self-pay | Admitting: Cardiology

## 2021-11-26 ENCOUNTER — Ambulatory Visit (HOSPITAL_BASED_OUTPATIENT_CLINIC_OR_DEPARTMENT_OTHER): Payer: Medicare PPO | Admitting: Anesthesiology

## 2021-11-26 ENCOUNTER — Ambulatory Visit (HOSPITAL_COMMUNITY)
Admission: RE | Admit: 2021-11-26 | Discharge: 2021-11-26 | Disposition: A | Discharge status: Home / Self Care | Payer: Medicare PPO | Source: Ambulatory Visit | Attending: Cardiology | Admitting: Cardiology

## 2021-11-26 ENCOUNTER — Ambulatory Visit (HOSPITAL_COMMUNITY): Payer: Medicare PPO | Admitting: Anesthesiology

## 2021-11-26 ENCOUNTER — Other Ambulatory Visit: Payer: Self-pay

## 2021-11-26 ENCOUNTER — Encounter (HOSPITAL_COMMUNITY)
Admission: RE | Disposition: A | Discharge status: Home / Self Care | Payer: Self-pay | Source: Ambulatory Visit | Attending: Cardiology

## 2021-11-26 ENCOUNTER — Encounter (HOSPITAL_COMMUNITY): Payer: Self-pay | Admitting: Cardiology

## 2021-11-26 DIAGNOSIS — I2510 Atherosclerotic heart disease of native coronary artery without angina pectoris: Secondary | ICD-10-CM | POA: Diagnosis not present

## 2021-11-26 DIAGNOSIS — I480 Paroxysmal atrial fibrillation: Secondary | ICD-10-CM | POA: Insufficient documentation

## 2021-11-26 DIAGNOSIS — I340 Nonrheumatic mitral (valve) insufficiency: Secondary | ICD-10-CM | POA: Insufficient documentation

## 2021-11-26 DIAGNOSIS — I421 Obstructive hypertrophic cardiomyopathy: Secondary | ICD-10-CM | POA: Diagnosis not present

## 2021-11-26 DIAGNOSIS — I5032 Chronic diastolic (congestive) heart failure: Secondary | ICD-10-CM | POA: Insufficient documentation

## 2021-11-26 DIAGNOSIS — Z7901 Long term (current) use of anticoagulants: Secondary | ICD-10-CM | POA: Insufficient documentation

## 2021-11-26 DIAGNOSIS — Z955 Presence of coronary angioplasty implant and graft: Secondary | ICD-10-CM | POA: Diagnosis not present

## 2021-11-26 DIAGNOSIS — Z853 Personal history of malignant neoplasm of breast: Secondary | ICD-10-CM | POA: Insufficient documentation

## 2021-11-26 DIAGNOSIS — I110 Hypertensive heart disease with heart failure: Secondary | ICD-10-CM | POA: Diagnosis not present

## 2021-11-26 DIAGNOSIS — Z79899 Other long term (current) drug therapy: Secondary | ICD-10-CM | POA: Insufficient documentation

## 2021-11-26 DIAGNOSIS — I509 Heart failure, unspecified: Secondary | ICD-10-CM | POA: Diagnosis not present

## 2021-11-26 DIAGNOSIS — Z87891 Personal history of nicotine dependence: Secondary | ICD-10-CM | POA: Insufficient documentation

## 2021-11-26 DIAGNOSIS — E785 Hyperlipidemia, unspecified: Secondary | ICD-10-CM | POA: Insufficient documentation

## 2021-11-26 DIAGNOSIS — I252 Old myocardial infarction: Secondary | ICD-10-CM | POA: Insufficient documentation

## 2021-11-26 DIAGNOSIS — I4891 Unspecified atrial fibrillation: Secondary | ICD-10-CM | POA: Diagnosis not present

## 2021-11-26 DIAGNOSIS — I4892 Unspecified atrial flutter: Secondary | ICD-10-CM

## 2021-11-26 HISTORY — PX: CARDIOVERSION: SHX1299

## 2021-11-26 SURGERY — CARDIOVERSION
Anesthesia: Monitor Anesthesia Care

## 2021-11-26 MED ORDER — LIDOCAINE 2% (20 MG/ML) 5 ML SYRINGE
INTRAMUSCULAR | Status: DC | PRN
  Administered 2021-11-26: 60 mg via INTRAVENOUS

## 2021-11-26 MED ORDER — SODIUM CHLORIDE 0.9 % IV SOLN: INTRAVENOUS | Status: DC

## 2021-11-26 MED ORDER — PROPOFOL 10 MG/ML IV BOLUS
INTRAVENOUS | Status: DC | PRN
  Administered 2021-11-26: 40 mg via INTRAVENOUS
  Administered 2021-11-26: 60 mg via INTRAVENOUS

## 2021-11-26 NOTE — Procedures (Signed)
Electrical Cardioversion Procedure Note Kim [name] [id number] 02-17-1935  Procedure: Electrical Cardioversion Indications:  Atrial Fibrillation  Procedure Details Consent: Risks of procedure as well as the alternatives and risks of each were explained to the (patient/caregiver).  Consent for procedure obtained. Time Out: Verified patient identification, verified procedure, site/side was marked, verified correct patient position, special equipment/implants available, medications/allergies/relevent history reviewed, required imaging and test results available.  Performed  Patient placed on cardiac monitor, pulse oximetry, supplemental oxygen as necessary.  Sedation given:  Propofol per anesthesiology Pacer pads placed anterior and posterior chest.  Cardioverted 1 time(s).  Cardioverted at [facility].  Evaluation Findings: Post procedure EKG shows: NSR Complications: None Patient did tolerate procedure well.   [name] 11/26/2021, 10:24 AM

## 2021-11-26 NOTE — Anesthesia Preprocedure Evaluation (Signed)
Anesthesia Evaluation  Patient identified by MRN, date of birth, ID band  Reviewed: Allergy & Precautions, NPO status   Airway Mallampati: II  TM Distance: >3 FB     Dental   Pulmonary neg pulmonary ROS,    breath sounds clear to auscultation       Cardiovascular hypertension, + CAD, + Past MI and +CHF   Rhythm:Regular Rate:Normal     Neuro/Psych negative neurological ROS     GI/Hepatic negative GI ROS, Neg liver ROS,   Endo/Other  Hypothyroidism   Renal/GU negative Renal ROS     Musculoskeletal  (+) Arthritis ,   Abdominal   Peds  Hematology   Anesthesia Other Findings   Reproductive/Obstetrics                             Anesthesia Physical Anesthesia Plan  ASA: 3  Anesthesia Plan: MAC   Post-op Pain Management:    Induction: Intravenous  PONV Risk Score and Plan: 2 and Propofol infusion  Airway Management Planned: Simple Face Mask  Additional Equipment:   Intra-op Plan:   Post-operative Plan:   Informed Consent:     Dental advisory given  Plan Discussed with: CRNA and Anesthesiologist  Anesthesia Plan Comments:         Anesthesia Quick Evaluation

## 2021-11-26 NOTE — Interval H&P Note (Signed)
History and Physical Interval Note:  11/26/2021 10:08 AM  Kim [name]  has presented today for surgery, with the diagnosis of AFLUTTER.  The various methods of treatment have been discussed with the patient and family. After consideration of risks, benefits and other options for treatment, the patient has consented to  Procedure(s): CARDIOVERSION (N/A) as a surgical intervention.  The patient's history has been reviewed, patient examined, no change in status, stable for surgery.  I have reviewed the patient's chart and labs.  Questions were answered to the patient's satisfaction.     Dell Hurtubise [employer]

## 2021-11-26 NOTE — Discharge Instructions (Signed)

## 2021-11-26 NOTE — Anesthesia Procedure Notes (Signed)
Procedure Name: General with mask airway Date/Time: 11/26/2021 10:06 AM Performed by: Mariea Clonts, CRNA Pre-anesthesia Checklist: Timeout performed, Patient being monitored, Suction available, Emergency Drugs available and Patient identified Patient Re-evaluated:Patient Re-evaluated prior to induction Oxygen Delivery Method: Ambu bag Preoxygenation: Pre-oxygenation with 100% oxygen Induction Type: IV induction

## 2021-11-26 NOTE — Transfer of Care (Signed)
Immediate Anesthesia Transfer of Care Note  Patient: Kim [name]  Procedure(s) Performed: CARDIOVERSION  Patient Location: Endoscopy Unit  Anesthesia Type:General  Level of Consciousness: awake, alert  and oriented  Airway & Oxygen Therapy: Patient Spontanous Breathing  Post-op Assessment: Report given to RN and Post -op Vital signs reviewed and stable  Post vital signs: Reviewed and stable  Last Vitals:  Vitals Value Taken Time  BP    Temp    Pulse    Resp    SpO2      Last Pain:  Vitals:   11/26/21 0950  TempSrc: Temporal  PainSc: 0-No pain         Complications: No notable events documented.

## 2021-11-26 NOTE — Anesthesia Postprocedure Evaluation (Signed)
Anesthesia Post Note  Patient: Kim [name]  Procedure(s) Performed: CARDIOVERSION     Patient location during evaluation: PACU Anesthesia Type: MAC Level of consciousness: awake Pain management: pain level controlled Vital Signs Assessment: post-procedure vital signs reviewed and stable Cardiovascular status: stable Postop Assessment: no apparent nausea or vomiting Anesthetic complications: no   No notable events documented.  Last Vitals:  Vitals:   11/26/21 1054 11/26/21 1055  BP: (!) 145/78 (!) 144/82  Pulse: 70 70  Resp: 14 17  Temp:    SpO2: 100% 99%    Last Pain:  Vitals:   11/26/21 1055  TempSrc:   PainSc: 0-No pain                 Moranda Billiot

## 2021-11-27 ENCOUNTER — Other Ambulatory Visit (HOSPITAL_COMMUNITY): Payer: Self-pay | Admitting: Cardiology

## 2021-11-27 DIAGNOSIS — M6281 Muscle weakness (generalized): Secondary | ICD-10-CM | POA: Diagnosis not present

## 2021-11-27 DIAGNOSIS — E1142 Type 2 diabetes mellitus with diabetic polyneuropathy: Secondary | ICD-10-CM | POA: Diagnosis not present

## 2021-11-27 DIAGNOSIS — S72109D Unspecified trochanteric fracture of unspecified femur, subsequent encounter for closed fracture with routine healing: Secondary | ICD-10-CM | POA: Diagnosis not present

## 2021-11-28 ENCOUNTER — Encounter (HOSPITAL_COMMUNITY): Payer: Self-pay | Admitting: Emergency Medicine

## 2021-11-28 ENCOUNTER — Other Ambulatory Visit: Payer: Self-pay

## 2021-11-28 ENCOUNTER — Emergency Department (HOSPITAL_COMMUNITY): Payer: Medicare PPO

## 2021-11-28 ENCOUNTER — Emergency Department (HOSPITAL_COMMUNITY)
Admission: EM | Admit: 2021-11-28 | Discharge: 2021-11-28 | Disposition: A | Discharge status: Home / Self Care | Payer: Medicare PPO | Attending: Emergency Medicine | Admitting: Emergency Medicine

## 2021-11-28 DIAGNOSIS — M79651 Pain in right thigh: Secondary | ICD-10-CM | POA: Diagnosis not present

## 2021-11-28 DIAGNOSIS — I10 Essential (primary) hypertension: Secondary | ICD-10-CM | POA: Diagnosis not present

## 2021-11-28 DIAGNOSIS — I509 Heart failure, unspecified: Secondary | ICD-10-CM | POA: Diagnosis not present

## 2021-11-28 DIAGNOSIS — R6889 Other general symptoms and signs: Secondary | ICD-10-CM | POA: Diagnosis not present

## 2021-11-28 DIAGNOSIS — Z7901 Long term (current) use of anticoagulants: Secondary | ICD-10-CM | POA: Insufficient documentation

## 2021-11-28 DIAGNOSIS — Z853 Personal history of malignant neoplasm of breast: Secondary | ICD-10-CM | POA: Diagnosis not present

## 2021-11-28 DIAGNOSIS — M79604 Pain in right leg: Secondary | ICD-10-CM | POA: Diagnosis not present

## 2021-11-28 DIAGNOSIS — I480 Paroxysmal atrial fibrillation: Secondary | ICD-10-CM | POA: Diagnosis not present

## 2021-11-28 DIAGNOSIS — F0390 Unspecified dementia without behavioral disturbance: Secondary | ICD-10-CM | POA: Diagnosis not present

## 2021-11-28 DIAGNOSIS — I110 Hypertensive heart disease with heart failure: Secondary | ICD-10-CM | POA: Insufficient documentation

## 2021-11-28 DIAGNOSIS — W19XXXA Unspecified fall, initial encounter: Secondary | ICD-10-CM | POA: Diagnosis not present

## 2021-11-28 MED ORDER — TRAMADOL HCL 50 MG PO TABS
50.0000 mg | ORAL_TABLET | Freq: Once | ORAL | Status: AC
  Administered 2021-11-28: 50 mg via ORAL

## 2021-11-28 MED ORDER — TRAMADOL HCL 50 MG PO TABS: 50.0000 mg | ORAL_TABLET | ORAL | 0 refills | Status: DC

## 2021-11-28 NOTE — ED Provider Notes (Signed)
[facility] EMERGENCY DEPARTMENT Provider Note   CSN: [id number] Arrival date & time: 11/28/21  1415     History  Chief Complaint  Patient presents with   Knee Pain    Kim [name] is a 87 y.o. female with a past medical history of paroxysmal A-fib, MR, artery disease, lumbar herniated disc, hypertension, active breast cancer, CHF and past MI presenting today due to right sided leg pain.  Patient reports that in November she fell and broke her right femur.  She had surgery and has been in rehab ever since.  2 weeks ago she had a fall in her skilled nursing facility which increased her right leg pain.  She does not fully remember the fall and believes she got dizzy and blacked out.  Denies any current dizziness, shortness of breath, chest pain, or palpitations.  Denies any history of DVT, smoking, recent travel or surgery.  She reports that she has only been given Tylenol for this pain at her facility because she has been out of tramadol for the past 6 days  Per chart review, patient has some degree of dementia, this was taken into consideration throughout history taking.  Case manager, Alma Friendly, was able to obtain collateral from patient's living facility which served as majority of the HPI.  Home Medications Prior to Admission medications   Medication Sig Start Date End Date Taking? Authorizing Provider  acetaminophen (TYLENOL) 500 MG tablet Take 1,000 mg by mouth in the morning, at noon, and at bedtime.    [provider]  amiodarone (PACERONE) 200 MG tablet Take 1 tablet (200 mg total) by mouth daily. 07/13/21   Larey Dresser, MD  diclofenac Sodium (VOLTAREN) 1 % GEL Apply 4 g topically 2 (two) times daily. (0800 & 2000) Apply to right knee    [provider]  diltiazem (CARDIZEM CD) 240 MG 24 hr capsule Take 1 capsule (240 mg total) by mouth daily. 08/28/21   Domenic Polite, MD  ELIQUIS 5 MG TABS tablet TAKE 1 TABLET BY MOUTH TWICE (2)  DAILY 10/06/21   Larey Dresser, MD  exemestane (AROMASIN) 25 MG tablet TAKE 1 TABLET BY MOUTH ONCE DAILY AFTER BREAKFAST 03/03/21   [name], MD  ferrous gluconate (FERGON) 324 MG tablet Take 324 mg by mouth daily with lunch.    [provider]  furosemide (LASIX) 20 MG tablet Take 1 tablet (20 mg total) by mouth daily. Patient not taking: Reported on 11/24/2021 11/05/21 02/03/22  Larey Dresser, MD  HYDROcodone-acetaminophen (NORCO/VICODIN) 5-325 MG tablet Take 1 tablet by mouth every 4 (four) hours as needed for moderate pain.    [provider]  levothyroxine (SYNTHROID) 137 MCG tablet Take 137 mcg by mouth daily before breakfast.    [provider]  methocarbamol (ROBAXIN) 500 MG tablet Take 1 tablet (500 mg total) by mouth every 8 (eight) hours as needed for muscle spasms. 08/24/21   Irving Copas, PA-C  metoprolol succinate (TOPROL-XL) 50 MG 24 hr tablet Take 50 mg by mouth daily. Take with or immediately following a meal.    [provider]  Multiple Vitamin (MULTIVITAMIN WITH MINERALS) TABS tablet Take 1 tablet by mouth daily.    [provider]  nitroGLYCERIN (NITROSTAT) 0.4 MG SL tablet Place 1 tablet (0.4 mg total) under the tongue every 5 (five) minutes as needed for chest pain. Patient taking differently: Place 0.4 mg under the tongue every 5 (five) minutes x 3 doses  as needed for chest pain. 07/24/20 11/05/22  Clegg, Amy D, NP  polyethylene glycol (MIRALAX / GLYCOLAX) 17 g packet Take 17 g by mouth daily. Patient not taking: Reported on 11/24/2021 08/28/21   Domenic Polite, MD  potassium chloride (KLOR-CON) 10 MEQ tablet Take 1 tablet (10 mEq total) by mouth daily. Patient not taking: Reported on 11/24/2021 11/05/21 02/03/22  Larey Dresser, MD  senna (SENOKOT) 8.6 MG TABS tablet Take 1 tablet (8.6 mg total) by mouth 2 (two) times daily. Patient not taking: Reported on 11/24/2021 08/28/21   Domenic Polite, MD  senna-docusate (SENOKOT-S)  8.6-50 MG tablet Take 1 tablet by mouth in the morning and at bedtime.    [provider]  traMADol (ULTRAM) 50 MG tablet Take 1 tablet (50 mg total) by mouth See admin instructions. Take 1 tablet (50 mg) by mouth (scheduled) twice daily (0800 & 2000). May take an additional dose (50 mg) by mouth once daily if needed for pain. 11/28/21   Donnice Nielsen A, PA-C  vitamin B-12 (CYANOCOBALAMIN) 1000 MCG tablet Take 1,000 mcg by mouth in the morning.    [provider]  ferrous sulfate 325 (65 FE) MG tablet Take 325 mg by mouth 2 (two) times daily.    01/16/19  [provider]  pravastatin (PRAVACHOL) 80 MG tablet Take 1 tablet (80 mg total) by mouth every evening. 11/20/15 01/16/19  Larey Dresser, MD      Allergies    Penicillins, Propoxyphene n-acetaminophen, Levaquin [levofloxacin], Repatha [evolocumab], Statins, Cortisone, Cymbalta [duloxetine hcl], Myrbetriq [mirabegron], and Pseudoephedrine    Review of Systems   Review of Systems See HPI  Physical Exam Updated Vital Signs BP 138/81    Pulse 81    Temp 97.6 F (36.4 C) (Oral)    Resp 19    LMP 10/19/1991    SpO2 99%  Physical Exam Vitals and nursing note reviewed.  Constitutional:      General: She is not in acute distress.    Appearance: Normal appearance. She is not ill-appearing.  HENT:     Head: Normocephalic and atraumatic.  Eyes:     General: No scleral icterus.    Conjunctiva/sclera: Conjunctivae normal.  Cardiovascular:     Rate and Rhythm: Normal rate. Rhythm irregular.  Pulmonary:     Effort: Pulmonary effort is normal. No respiratory distress.  Musculoskeletal:     Comments: Full range of motion of the bilateral lower extremities.  Strong DP pulses.  No appreciable lower extremity edema.  Some tenderness to deep palpation of the right femur.  Skin:    General: Skin is warm and dry.     Findings: No rash.  Neurological:     Mental Status: She is alert.     Motor: No weakness.      Comments: 5 out of 5 strength in all motions of the bilateral lower extremities.  Psychiatric:        Mood and Affect: Mood normal.    ED Results / Procedures / Treatments   Labs (all labs ordered are listed, but only abnormal results are displayed) Labs Reviewed - No data to display  EKG None  Radiology DG Femur Min 2 Views Right  Result Date: 11/28/2021 CLINICAL DATA:  Pain after fall. EXAM: RIGHT FEMUR 2 VIEWS COMPARISON:  08/23/2021 FINDINGS: Intramedullary nail fixation proximal RIGHT femur. Avulsion of the lesser trochanter similar prior. Heterotopic calcification about the fracture planes. No acute fracture or dislocation. Total knee arthroplasty. IMPRESSION: No acute fracture  dislocation. Electronically Signed   By: Suzy Bouchard M.D.   On: 11/28/2021 17:57    Procedures Procedures    Medications Ordered in ED Medications  traMADol (ULTRAM) tablet 50 mg (50 mg Oral Given 11/28/21 1542)    ED Course/ Medical Decision Making/ A&P                           Medical Decision Making Amount and/or Complexity of Data Reviewed Radiology: ordered.  Risk Prescription drug management.   Patient presents to the ED for concern of leg pain.  Differential includes but is not limited to DVT, cellulitis, fracture, rhabdo, ligamental or meniscal injury, and radiculopathy.  Co morbidities that complicate the patient evaluation include: Previous right lower extremity procedure, known Paroxysmal A-fib potentially causing multiple falls  Additional history obtained from internal and external records:  Per internal and external chart review, patient has had multiple episodes of originally, she presented in A-fib with RVR in 2021.  She had no history of this.  She was cardioverted however she converted back into A-fib within the next few months.  Started on Eliquis and amiodarone.  Per chart review, patient had a cardioversion outpatient on 2/23. In afib today.   Workup:  Imaging  Studies ordered:  I ordered and individually viewed patient's femur radiograph. I noted no abnormalities.  Radiologist read negative.  . Cardiac Monitoring:  The patient was maintained on a cardiac monitor.  I personally viewed and interpreted the cardiac monitored which showed rate controlled afib  4. Test Considered: I considered ordering lab work to further work-up patient's potential falls however per chart review, this has been done multiple times.  She follows closely with cardiology as well as physical therapy and her pain is subacute and appears more related to running out of her tramadol.   Treatment:  Medications:  I ordered tramadol for her pain.  She says that this helped her.   Dispo:  Problem List / ED Course:  Leg pain.  Social determinants of health include patient's dementia and inability to consistently access her tramadol.   Dispostion:  After the interventions noted above, I ambulated the patient she was steady on her feet.  At this time I believe she is stable for discharge with a refill of her tramadol that has been sent to the 24-hour pharmacy due to her proximal pharmacy being closed until Monday.  Final Clinical Impression(s) / ED Diagnoses Final diagnoses:  Pain of right lower extremity    Rx / DC Orders ED Discharge Orders          Ordered    traMADol (ULTRAM) 50 MG tablet  See admin instructions        11/28/21 1548           Results and diagnoses were explained to the patient. Return precautions discussed in full. Patient had no additional questions and expressed complete understanding.   This chart was dictated using voice recognition software.  Despite best efforts to proofread,  errors can occur which can change the documentation meaning.    Rhae Hammock, PA-C 11/28/21 1826    Gareth Morgan, MD 11/29/21 1016

## 2021-11-28 NOTE — Discharge Instructions (Signed)
I have sent a refill of your tramadol to the pharmacy.  It is important for you to follow-up with primary care who can continue to prescribe this medication.

## 2021-11-28 NOTE — ED Notes (Signed)
Help patient on the bedpan patient is now off the pan with call bell in reach

## 2021-11-28 NOTE — ED Notes (Signed)
Got patient undressed into a gown on the monitor patient is resting with call bell in reach and family at bedside got patient some warm blankets

## 2021-11-28 NOTE — ED Notes (Signed)
Pt able to ambulate with walker without assistance

## 2021-11-28 NOTE — Care Management (Addendum)
Reviewed paperwork from [facility]. Called and spoke with [employer]. Tramadol has been circled ( not given ) for the past 6 days. . The order ran out and was not renewed by the MD. They made attempts to call for an order as well as speak with the pharmacy that supplies the facility The pharmacy does not deliver medications every day.  But still there was no order for medication from MD, who is listed as Dr Su Grand., however a different ]MD is seeing her while at SNF.  1545 spoke with PA regarding medication for pain. She will assess and order via pharmacy and also call [facility] concerning this medication.

## 2021-11-28 NOTE — ED Notes (Signed)
Patient reports to this RN that she has not received her pain medication in 4 days.

## 2021-11-28 NOTE — ED Triage Notes (Signed)
Patient BIB [facility] from [facility]. Complaint of knee and leg pain d/t fall 2 weeks ago. A&Ox4.

## 2021-11-29 ENCOUNTER — Encounter (HOSPITAL_COMMUNITY): Payer: Self-pay | Admitting: Cardiology

## 2021-11-30 DIAGNOSIS — S72109D Unspecified trochanteric fracture of unspecified femur, subsequent encounter for closed fracture with routine healing: Secondary | ICD-10-CM | POA: Diagnosis not present

## 2021-11-30 DIAGNOSIS — E1142 Type 2 diabetes mellitus with diabetic polyneuropathy: Secondary | ICD-10-CM | POA: Diagnosis not present

## 2021-11-30 DIAGNOSIS — M6281 Muscle weakness (generalized): Secondary | ICD-10-CM | POA: Diagnosis not present

## 2021-12-02 DIAGNOSIS — E1142 Type 2 diabetes mellitus with diabetic polyneuropathy: Secondary | ICD-10-CM | POA: Diagnosis not present

## 2021-12-02 DIAGNOSIS — S72109D Unspecified trochanteric fracture of unspecified femur, subsequent encounter for closed fracture with routine healing: Secondary | ICD-10-CM | POA: Diagnosis not present

## 2021-12-02 DIAGNOSIS — M6281 Muscle weakness (generalized): Secondary | ICD-10-CM | POA: Diagnosis not present

## 2021-12-03 ENCOUNTER — Encounter (HOSPITAL_COMMUNITY): Payer: Medicare PPO

## 2021-12-04 DIAGNOSIS — M6281 Muscle weakness (generalized): Secondary | ICD-10-CM | POA: Diagnosis not present

## 2021-12-04 DIAGNOSIS — E1142 Type 2 diabetes mellitus with diabetic polyneuropathy: Secondary | ICD-10-CM | POA: Diagnosis not present

## 2021-12-04 DIAGNOSIS — S72109D Unspecified trochanteric fracture of unspecified femur, subsequent encounter for closed fracture with routine healing: Secondary | ICD-10-CM | POA: Diagnosis not present

## 2021-12-07 DIAGNOSIS — E1142 Type 2 diabetes mellitus with diabetic polyneuropathy: Secondary | ICD-10-CM | POA: Diagnosis not present

## 2021-12-07 DIAGNOSIS — S72109D Unspecified trochanteric fracture of unspecified femur, subsequent encounter for closed fracture with routine healing: Secondary | ICD-10-CM | POA: Diagnosis not present

## 2021-12-07 DIAGNOSIS — M6281 Muscle weakness (generalized): Secondary | ICD-10-CM | POA: Diagnosis not present

## 2021-12-09 DIAGNOSIS — E1142 Type 2 diabetes mellitus with diabetic polyneuropathy: Secondary | ICD-10-CM | POA: Diagnosis not present

## 2021-12-09 DIAGNOSIS — S72109D Unspecified trochanteric fracture of unspecified femur, subsequent encounter for closed fracture with routine healing: Secondary | ICD-10-CM | POA: Diagnosis not present

## 2021-12-09 DIAGNOSIS — M6281 Muscle weakness (generalized): Secondary | ICD-10-CM | POA: Diagnosis not present

## 2021-12-09 NOTE — Progress Notes (Signed)
Patient ID: Kim [name], female   DOB: 1935-01-09, 87 y.o.   MRN: [id number] PCP: Dr. Sarajane Jews Oncologist: Dr. Marin Olp Cardiology: Dr. Aundra Dubin  87 y.o. with history of CAD, HCM, paroxysmal atrial fibrillation, and mitral regurgitation presents for followup of HCM, CHF, MR.  Patient had breast cancer in 2015 and received treatment involving Herceptin.  During breast cancer treatment, she developed unstable angina and ended up getting a DES to the mid RCA in 3/15.  Patient additionally has a history of HOCM.  This has been recognized on prior echoes.  She has severe asymmetric basal septal hypertrophy and SAM with LVOT gradient peak 58 mmHg on echo in 3/15 along with moderate MR.  Repeat echo in 7/15 showed LVOT gradient down to 30 mmHg on higher beta blocker.  Repeat echo in 11/15 showed no significant LVOT gradient but SAM still present.  Echo (8/16) showed asymmetric septal hypertrophy, No SAM, no significant LVOT gradient.  Echo 4/18 showed EF 55-60%, small LVOT gradient with severe asymmetric septal hypertrophy, mild MR, PASP 32 mmHg.    She was admitted in 1/20 with symptomatic atrial fibrillation with RVR, this was a new diagnosis.  She was started on Eliquis and cardioverted after TEE back to NSR.  TEE showed severe mitral regurgitation that appeared to be due mostly to posterior leaflet prolapse, there was minimal systolic anterior motion.   In 3/20, she had Mitraclip placement. Echo in 6/20 showed EF 60-65%, severe asymmetric septal hypertrophy, normal RV, s/p Mitraclip with mean gradient 3 mmHg and trivial MR.  Echo in 3/21 showed EF 65%, asymmetric septal hypertrophy with LVOT gradient 37 mmHg, SAM of clipped segment of the mitral valve, normal RV, severe LAE, s/p Mitraclip x 2 with mild MR/no MS.   In October she went to [facility] ED on 07/07/20 for chest pain. She was in A fib at that time. Work up negative so she was discharged later that day.   Had cardioversion 08/07/20 --> NSR  Evaluated in the  ED on 01/23/21 for  presyncope on 01/22/21 and syncope 01/23/21. She does not recall tripping or dizziness. Bruising noted on face/arms. No acute findings. She declined cardiology consultation and preferred to be seen in the clinic. She was supposed to wear Zio patch but this was never done.  Carotid dopplers showed no significant disease in 5/22.   Echo in 5/22 showed EF 60-65% with several focal basal septal hypertrophy, there does not appear to be a significant LVOT gradient, s/p Mitraclip with mean gradient 6 mmHg and mild-moderate MR, RV normal, PASP 38 mmHg.   She developed symptomatic atrial fibrillation again and underwent DCCV in 6/22 on amiodarone. Zio monitor in 8/22 showed NSR, short runs of SVT, no AF.  She was admitted in 11/22 after a mechanical fall with a right hip fracture, now s/p repair. She was noted to be in atrial fibrillation during that admission.    S/P DC-CV 11/26/21 with restoration NSR.   Today she returns for HF follow up with her daughter. Overall feeling fine. Main complaint is leg weakness. She has had 2 recent falls over the last week. Mechanical fall. Continues to work with therapy 2-3 days a week.  Denies SOB/PND/Orthopnea. Appetite ok. No fever or chills. She has not been weighing Assisted Living. Taking all medications.    Labs (3/15): K 4.5, creatinine 0.84, LDL 91, HDL 46 Labs (5/15): K 3.9, creatinine 1.1 Labs (12/15): LDL 80, HDL 28, hemoglobin 10.9 Labs (1/16): K 3.7, creatinine 0.8 Labs (  2/16): K 3.6, creatinine 1.2, HCT 34.4 Labs (6/16): K 4.1, creatinine 1.24 Labs (7/17): K 4.1, creatinine 1.2, HCT 38.8, LDL 143 Labs (1/18): LDL 142 Labs (5/18): K 4.4, creatinine 1.0 Labs (1/20): K 3.9, creatinine 1.27, LDL 95 Labs (8/20): K 3.8, creatinine 2.06, LFTs normal Labs (12/20): K 4.1, creatinine 1.14 Labs (2/21): LDL 121 Labs (3/21): K 4.4, creatinine 1.27 Labs (01/23/21): K 3.5 Creatinine 1.2, TSH normal, BNP 385 Labs (5/22): K 4.3, creatinine 1.26, hgb  11.7, LDL 122 Labs (7/22): LFTs normal Labs (8/22): K 4.3, creatinine 1.45 Labs (11/22): K 3.7, creatinine 1.24  PMH: 1. CAD: Unstable angina 3/15 with LHC showing 99% mRCA stenosis, treated with DES to mRCA.  2. Hypothyroidism 3. Diverticulosis 4. HTN 5. H/o TAH/BSO 6. Breast cancer: s/p lumpectomy with lymph node biopsy in 2/15.  4/10 nodes positive.  She was started on docetaxol/carboplatin/Herceptin in 3/15 with plan for 6 cycles chemo.  7. Hypertrophic obstructive cardiomyopathy: Echo (3/15) with severe focal basal septal hypertrophy (22 mm), narrow LV outflow tract with mitral valve SAM and 58 mmHg peak LVOT resting gradient, EF 60-65%, moderate MR, moderate LAE, normal RV, lateral s' 10.4 cm/sec.  Patient says that her 2 grown sons has had echoes to screen for HOCM.  Echo (4/15) with EF 65-70%, severe focal basal septal hypertrophy, LVOT gradient 33 mmHg, SAM was present with moderate MR, normal RV size and systolic function, lateral S' 10.6, GLS -17.5%.  Cardiac MRI (5/15) with EF 65%, moderate asymmetric septal hypertrophy, systolic anterior motion of the mitral valve with moderate MR, there was no delayed enhancement.  Echo (7/15) with EF 65-70%, moderate ASH, peak LVOT gradient 30 mmHg, SAM with moderate MR, moderate to severe LAE.  Echo (11/15) with EF 55-60%, GLS -15%, no significant LVOT gradient, systolic anterior motion of the mitral valve with mild MR, normal RV size and systolic function. Echo (4/14) with EF 55-60%, severe asymmetric septal hypertrophy, LVOT gradient 20 mmHg, mild-moderate MR, GLS -18%.  - Echo (8/16): EF 60-65%, asymmetric septal hypertrophy, no significant LV outflow tract gradient, mild MR.   - Echo (4/18): EF 55-60%, small LVOT gradient with severe asymmetric septal hypertrophy, mild MR, PASP 32 mmHg.   - TEE (1/20): EF 55-60%, moderate-severe asymmetric septal hypertrophy, no LVOT gradient, normal RV size and systolic function, severe LAE, severe MR with  posterior MV leaflet prolapse and minimal SAM.   - Echo (6/20): EF 60-65%, severe asymmetric septal hypertrophy, normal RV, s/p Mitraclip with mean gradient 3 mmHg and trivial MR.   - Echo (3/21): EF 65%, asymmetric septal hypertrophy with LVOT gradient 37 mmHg, SAM of clipped segment of the mitral valve, normal RV, severe LAE, s/p Mitraclip x 2 with mild MR/no MS.  - Echo (5/22): EF 60-65% with several focal basal septal hypertrophy, There was no significant LVOT gradient, s/p Mitraclip with mean gradient 6 mmHg and mild-moderate MR, RV normal, PASP 38 mmHg.  8. Hyperlipidemia: Myalgias with atorvastatin, myalgias with Repatha.  9. Atrial fibrillation: Paroxysmal, first noted in 1/20.  - DCCV 11/21.  - DCCV 6/22 - Zio monitor (8/22): NSR, rare short SVT, no AF.  10. Mitral regurgitation: Appears severe on 1/20 TEE.  Has posterior leaflet prolapse with minimal SAM.  - Mitraclip 3/20.  11. Right hip fracture 11/22 with repair.   SH: Married, 2 children, retired, nonsmoker.   FH: No family history of HOCM or sudden death.  There is a family history of CAD.   ROS: All systems reviewed and  negative except as per HPI.    Current Outpatient Medications  Medication Sig Dispense Refill   acetaminophen (TYLENOL) 500 MG tablet Take 1,000 mg by mouth in the morning, at noon, and at bedtime.     amiodarone (PACERONE) 200 MG tablet Take 1 tablet (200 mg total) by mouth daily. 90 tablet 3   diclofenac Sodium (VOLTAREN) 1 % GEL Apply 4 g topically 2 (two) times daily. (0800 & 2000) Apply to right knee     diltiazem (CARDIZEM CD) 240 MG 24 hr capsule Take 1 capsule (240 mg total) by mouth daily.     ELIQUIS 5 MG TABS tablet TAKE 1 TABLET BY MOUTH TWICE (2) DAILY 180 tablet 1   exemestane (AROMASIN) 25 MG tablet TAKE 1 TABLET BY MOUTH ONCE DAILY AFTER BREAKFAST 90 tablet 3   ferrous gluconate (FERGON) 324 MG tablet Take 324 mg by mouth daily with lunch.     furosemide (LASIX) 20 MG tablet Take 1 tablet  (20 mg total) by mouth daily. 90 tablet 3   levothyroxine (SYNTHROID) 137 MCG tablet Take 137 mcg by mouth daily before breakfast.     metoprolol succinate (TOPROL-XL) 50 MG 24 hr tablet Take 50 mg by mouth daily. Take with or immediately following a meal.     Multiple Vitamin (MULTIVITAMIN WITH MINERALS) TABS tablet Take 1 tablet by mouth daily.     nitroGLYCERIN (NITROSTAT) 0.4 MG SL tablet Place 1 tablet (0.4 mg total) under the tongue every 5 (five) minutes as needed for chest pain. 100 tablet 3   polyethylene glycol (MIRALAX / GLYCOLAX) 17 g packet Take 17 g by mouth daily. 14 each 0   potassium chloride (KLOR-CON) 10 MEQ tablet Take 1 tablet (10 mEq total) by mouth daily. 90 tablet 3   senna (SENOKOT) 8.6 MG TABS tablet Take 1 tablet (8.6 mg total) by mouth 2 (two) times daily.  0   senna-docusate (SENOKOT-S) 8.6-50 MG tablet Take 1 tablet by mouth in the morning and at bedtime.     traMADol (ULTRAM) 50 MG tablet Take 1 tablet (50 mg total) by mouth See admin instructions. Take 1 tablet (50 mg) by mouth (scheduled) twice daily (0800 & 2000). May take an additional dose (50 mg) by mouth once daily if needed for pain. 15 tablet 0   vitamin B-12 (CYANOCOBALAMIN) 1000 MCG tablet Take 1,000 mcg by mouth in the morning.     methocarbamol (ROBAXIN) 500 MG tablet Take 1 tablet (500 mg total) by mouth every 8 (eight) hours as needed for muscle spasms. (Patient not taking: Reported on 12/10/2021) 30 tablet 0   No current facility-administered medications for this encounter.   BP 124/80    Pulse 86    Wt 60.9 kg (134 lb 3.2 oz)    LMP 10/19/1991    SpO2 98%    BMI 21.66 kg/m   Vitals:   12/10/21 1347  BP: 124/80  Pulse: 86  SpO2: 98%    Wt Readings from Last 3 Encounters:  12/10/21 60.9 kg (134 lb 3.2 oz)  11/26/21 62.8 kg (138 lb 8 oz)  11/10/21 62.8 kg (138 lb 8 oz)   General:  Arrived in wheel chair. No resp difficulty HEENT: normal Neck: supple. no JVD. Carotids 2+ bilat; no bruits. No  lymphadenopathy or thryomegaly appreciated. Cor: PMI nondisplaced. Irregular rate & rhythm. No rubs, gallops or murmurs. Lungs: clear Abdomen: soft, nontender, nondistended. No hepatosplenomegaly. No bruits or masses. Good bowel sounds. Extremities: no cyanosis, clubbing, rash,  edema Neuro: alert & orientedx3, cranial nerves grossly intact. moves all 4 extremities w/o difficulty. Affect pleasant  EKG:  A fib 86 personally checked  Assessment/Plan: 1. CAD: Status post PCI for unstable angina in 3/15 with DES to [facility]. No chest pain.    - No ASA, taking Eliquis..     - Unable to tolerate statins or Repatha. 2. Hyperlipidemia: Myalgias with Crestor, Lipitor, and pravastatin.  Myalgias with Repatha.  - She is off Zetia currently, should restart.  3. Hypertrophic obstructive cardiomyopathy: No family history of HOCM or sudden death (interestingly, her husband has HOCM). Cardiac MRI in 5/15 showed no delayed enhancement.  Echo in 3/21 (post-Mitraclip) showed asymmetric septal hypertrophy with LVOT gradient peak 37 mmHg, SAM of the clipped segment of the mitral valve with mild MR, no MS.  Echo in 5/22 showed asymmetric septal hypertrophy but there was no significant LVOT gradient. Minimal murmur on exam  - Continue diltiazem CD 240 mg daily.  - Continue Toprol XL 25 mg bid.   - Not candidate for mavacamten with no significant LVOT gradient detected.  - Patient's daughter still needs echo screening for HCM, her son has been screened.     4. Atrial fibrillation: Noted initially in 1/20, suspect this was triggered by mitral regurgitation and HCM.  She was cardioverted to NSR.  She was on amiodarone for a few months, this was stopped after Mitraclip. In A fib 07/07/20. Had successful cardioversion 08/07/20 and in 6/22.  She was in atrial fibrillation in 11/22 when she fractured her hip and is in atypical atrial flutter.   -S/P DC-CV 11/26/21 with restoration to NSR but today she is back in Afib with controlled  rate.  - Will send to A fib clinic for other options. If plan for rate control will need to stop amiodarone.   Continue amiodarone 200 mg daily + diltiazem + metoprolol.  - Continue Eliquis now. I am concerned about bleeding if she continues to fall.  5. Chronic diastolic CHF:  Echo in 3/34 with normal EF and normal RV, suspect she still has LVOT gradient though it was not measured on echo.    -Functional class difficult to determine but I thinks she is NYHA II.   -Volume status stable. Continue Lasix 20 mg daily with KCl 10 daily.  6. Mitral regurgitation: Suspect that mitral regurgitation may have been a trigger for atrial fibrillation.  She has now had Mitraclip placement, echo in 5/22 showed mild-moderate MR, mild MS with mean gradient 6 mmHg.  - Antibiotics with dental work.  7. Syncope: No recent issues  8. Falls 2 recent mechanical falls.    Refer to A Fib clinic to see we have any other options\ or will rate control be recommended. Discussed with Dr Aundra Dubin  ? Watchman consideration.   Follow up in 3 months with Dr Aundra Dubin.    Deran Barro NP-C  12/10/2021

## 2021-12-10 ENCOUNTER — Ambulatory Visit (HOSPITAL_COMMUNITY)
Admission: RE | Admit: 2021-12-10 | Discharge: 2021-12-10 | Disposition: A | Discharge status: Home / Self Care | Payer: Medicare PPO | Source: Ambulatory Visit | Attending: Cardiology | Admitting: Cardiology

## 2021-12-10 ENCOUNTER — Other Ambulatory Visit: Payer: Self-pay

## 2021-12-10 ENCOUNTER — Encounter (HOSPITAL_COMMUNITY): Payer: Self-pay

## 2021-12-10 VITALS — BP 124/80 | HR 86 | Wt 134.2 lb

## 2021-12-10 DIAGNOSIS — R531 Weakness: Secondary | ICD-10-CM | POA: Diagnosis not present

## 2021-12-10 DIAGNOSIS — I5032 Chronic diastolic (congestive) heart failure: Secondary | ICD-10-CM | POA: Insufficient documentation

## 2021-12-10 DIAGNOSIS — Z955 Presence of coronary angioplasty implant and graft: Secondary | ICD-10-CM | POA: Diagnosis not present

## 2021-12-10 DIAGNOSIS — R296 Repeated falls: Secondary | ICD-10-CM

## 2021-12-10 DIAGNOSIS — I484 Atypical atrial flutter: Secondary | ICD-10-CM | POA: Insufficient documentation

## 2021-12-10 DIAGNOSIS — I480 Paroxysmal atrial fibrillation: Secondary | ICD-10-CM | POA: Insufficient documentation

## 2021-12-10 DIAGNOSIS — Z7901 Long term (current) use of anticoagulants: Secondary | ICD-10-CM | POA: Diagnosis not present

## 2021-12-10 DIAGNOSIS — Z9181 History of falling: Secondary | ICD-10-CM | POA: Diagnosis not present

## 2021-12-10 DIAGNOSIS — I421 Obstructive hypertrophic cardiomyopathy: Secondary | ICD-10-CM | POA: Diagnosis not present

## 2021-12-10 DIAGNOSIS — I110 Hypertensive heart disease with heart failure: Secondary | ICD-10-CM | POA: Diagnosis not present

## 2021-12-10 DIAGNOSIS — I2510 Atherosclerotic heart disease of native coronary artery without angina pectoris: Secondary | ICD-10-CM | POA: Insufficient documentation

## 2021-12-10 DIAGNOSIS — I340 Nonrheumatic mitral (valve) insufficiency: Secondary | ICD-10-CM | POA: Insufficient documentation

## 2021-12-10 DIAGNOSIS — Z79899 Other long term (current) drug therapy: Secondary | ICD-10-CM | POA: Insufficient documentation

## 2021-12-10 DIAGNOSIS — E785 Hyperlipidemia, unspecified: Secondary | ICD-10-CM | POA: Diagnosis not present

## 2021-12-10 NOTE — Patient Instructions (Addendum)
The instructions below were given to patient via SNF orders: [facility] ? ? ?It was great to see you today! ?No medication changes are needed at this time. ? ? ?You have been referred to [facility] ?-they will be in contact with appointment ? ?Keep cardiology follow up as scheduled with Dr Aundra Dubin ? ? ?

## 2021-12-11 DIAGNOSIS — E1142 Type 2 diabetes mellitus with diabetic polyneuropathy: Secondary | ICD-10-CM | POA: Diagnosis not present

## 2021-12-11 DIAGNOSIS — M6281 Muscle weakness (generalized): Secondary | ICD-10-CM | POA: Diagnosis not present

## 2021-12-11 DIAGNOSIS — S72109D Unspecified trochanteric fracture of unspecified femur, subsequent encounter for closed fracture with routine healing: Secondary | ICD-10-CM | POA: Diagnosis not present

## 2021-12-11 NOTE — Progress Notes (Incomplete)
Histology and Location of Primary Cancer: Stage IIA (T1N1M0) adenocarcinoma of the left breast-triple positive ? ?Location(s) of Symptomatic tumor(s): left upper lobe pulmonary nodule ? ?Past/Anticipated chemotherapy by medical oncology, if any: Dr Marin Olp ?Current Therapy:        ?Status post cycle 4 of Abraxane/Herceptin ?Maintenance Herceptin q 3wk - finish 03/25/2015 ?Zometa 4 mg IV q 6 months - due in 03/2022  ?Aromasin 25 mg by mouth daily  - start on 01/10/2017  ?Feraheme as needed-dose given today ?SBRT-to be done in 11/2021 ? ?Patient's main complaints related to symptomatic tumor(s) are: *** ? ?Pain on a scale of 0-10 is: ***  ? ? ?Ambulatory status? Walker? Wheelchair?: *** ? ?SAFETY ISSUES: ?Prior radiation? Left breast 2015 ?Pacemaker/ICD? *** ?Possible current pregnancy? No, hysterectomy ?Is the patient on methotrexate? *** ? ?Additional Complaints / other details:  *** ? ?*** ?

## 2021-12-14 DIAGNOSIS — S72109D Unspecified trochanteric fracture of unspecified femur, subsequent encounter for closed fracture with routine healing: Secondary | ICD-10-CM | POA: Diagnosis not present

## 2021-12-14 DIAGNOSIS — E1142 Type 2 diabetes mellitus with diabetic polyneuropathy: Secondary | ICD-10-CM | POA: Diagnosis not present

## 2021-12-14 DIAGNOSIS — M6281 Muscle weakness (generalized): Secondary | ICD-10-CM | POA: Diagnosis not present

## 2021-12-16 DIAGNOSIS — S72109D Unspecified trochanteric fracture of unspecified femur, subsequent encounter for closed fracture with routine healing: Secondary | ICD-10-CM | POA: Diagnosis not present

## 2021-12-16 DIAGNOSIS — E1142 Type 2 diabetes mellitus with diabetic polyneuropathy: Secondary | ICD-10-CM | POA: Diagnosis not present

## 2021-12-16 DIAGNOSIS — M6281 Muscle weakness (generalized): Secondary | ICD-10-CM | POA: Diagnosis not present

## 2021-12-16 NOTE — Progress Notes (Incomplete)
?Radiation Oncology         [phone] ?________________________________ ? ?Initial Outpatient Consultation ? ?Name: Kim [name] MRN: [id number]  ?Date: 12/17/2021  DOB: 12-25-1934 ? ?QI:[id number], Liane Comber, DO  [name], MD  ? ?REFERRING PHYSICIAN: [name], MD ? ?DIAGNOSIS: There were no encounter diagnoses. ? ?Solitary left upper lobe nodule - hypermetabolic on PET scan (tissue diagnosis differed)  ? ?Stage IIA (T1N1M0) adenocarcinoma of the left breast-triple positive diagnosed in 2015: S/p chemotherapy, lumpectomy, radiation, and herceptin  ? ?HISTORY OF PRESENT ILLNESS::Kim [name] is a 87 y.o. female who is accompanied by ***. she is seen as a courtesy of Dr. Jacelyn Pi for an opinion concerning radiation therapy as part of management for her recently diagnosed left upper lobe nodule. As noted above, the patient has a history of left breast cancer diagnosed in 2015, s/p chemotherapy, lumpectomy, radiation, and herceptin. Since then, the patient has maintained surveillance under Dr. Marin Olp. Her recent history is as follows.  ? ?During a routine follow up visit with Dr. Marin Olp on 10/01/21, the patient reported pain over the left lateral anterior chest wall. Physical exam performed revealed no obvious findings, prompting Dr. Marin Olp to order a chest CT.  ? ?Subsequent chest CT on 10/15/21 showed interval enlargement of a nodule in the posterior aspect of the left upper lobe, highly concerning for slow growing primary ?bronchogenic neoplasm such as a bronchogenic adenocarcinoma. Metastatic disease was noted to be less likely in regards to this finding given the very slow progression compared to 2015. CT also showed other tiny pulmonary nodules in the lungs bilaterally which were nonspecific and likely benign. ? ?PET on 10/30/21 demonstrated the 1 mm left upper lobe pulmonary nodule as mildly hypermetabolic. Findings were noted as again worrisome for a primary pulmonary neoplasm than a  solitary metastasis. Otherwise, PET showed no evidence of mediastinal/ hilar lymphadenopathy, or findings for metastatic disease involving the abdomen/pelvis ?or bony structures. (Other findings on PET included diffuse hypermetabolism in the thyroid gland suggesting thyroiditis).  ? ?Accordingly, the patient followed up with Dr. Marin Olp on 11/10/21 to discuss recent imaging. Given the patient's multiple comorbidities and advance age, Dr. Marin Olp did not recommended any invasive procedures or tissue diagnoses to treat the nodule, and referred the patient to me for consideration of radiosurgery.  ? ?Per recent follow-up visits with Dr. Marin Olp, the patient denied any chest pain or respiratory issues.  ? ?Of note: She patient has dementia, chronic pain, and is hard of hearing. She also recently underwent cardioversion on 11/26/21 for a-fib, and presented to the ED on 11/28/21 for evaluation of right leg pain. She reported that she fell and broke her right femur in November resulting in surgery and has been in rehab ever since (currently resides in SNF).  2 weeks prior to ED presentation, she had a fall in her skilled nursing facility which increased her right leg pain. Following evaluation in the ED, the patient was able to ambulate well with assistance and was prescribed tramadol for her chronic leg pain. ? ?PREVIOUS RADIATION THERAPY: Yes, Dr. Isidore Moos ? ?TisN2Mx Stage III left breast ductal carcinoma with 5 of 11 positive lymph nodes  ?Indication for treatment:  curative      ?Radiation treatment dates:   05/14/2014-06/26/2014 ?Site/dose:       ?1) Left Breast  / 50 Gy in 25 fractions ?2) Left Supraclavicular fossa/ 47.5 Gy in 25 fractions ?3) Left Posterior Axillary boost / 4.825 Gy in 25 fractions ?4) Left  Breast boost / 10 Gy in 5 fractions ? ?PAST MEDICAL HISTORY:  ?Past Medical History:  ?Diagnosis Date  ? Allergic rhinitis   ? Allergy   ? Arthritis   ? hands  ? Breast cancer ([place])   ? Breast cancer of upper-outer  quadrant of left female breast ([place]) 11/08/13  ? finished chemo and radiation 2015  ? CAD (coronary artery disease)   ? a. 12/2013 Cath/PCI: LM nl, LAD 20p, 70m 30d, D1 30p, LCX 20p, 332mOM1 20, Mo2 40p, RCA 30p, 9958m.0x16 Promus Premier DES), 20d, RPL 30, RPDA 70p, 41m94m 65-70%.  ? CHF (congestive heart failure) (HCC)[place]? Diverticulosis   ? HOH (hard of hearing)   ? bilateral, wears hearing aids  ? Hx of adenomatous colonic polyps 02/1999  ? Hyperlipidemia   ? Hypertension   ? Hypertrophic obstructive cardiomyopathy (HOCM) (HCC)[place]? Cardiologist is Dr. DaltLoralie ChampagneHypothyroidism   ? Iron deficiency anemia due to chronic blood loss 01/07/2016  ? Low back pain   ? gets ESI from Dr. Max Rennis HardingMalabsorption of iron 01/07/2016  ? [facility]  ? very mild-with first chemo -placed stent x1  ? Persistent atrial fibrillation with rapid ventricular response (HCC)[place]11/2020  ? Personal history of radiation therapy 2015  ? S/P radiation therapy 05/14/2014-06/26/2014  ? 1) Left Breast  / 50 Gy in 25 fractions, 2) Left Supraclavicular fossa/ 47.5 Gy in 25 fractions, 3) Left Posterior Axillary boost / 4.825 Gy in 25 fractions, 4) Left Breast boost / 10 Gy in 5 fractions   ? Wears hearing aid   ? left and right   ? ? ?PAST SURGICAL HISTORY: ?Past Surgical History:  ?Procedure Laterality Date  ? ABDOMINAL HYSTERECTOMY    ? ANTERIOR AND POSTERIOR REPAIR N/A 01/17/2015  ? Procedure: CYSTOCELE REPAIR ;  Surgeon: MariPrincess Bruins;  [facility];  Service: Gynecology;  Laterality: N/A;  ? BILATERAL SALPINGOOPHORECTOMY    ? see BethLaurin Coderfor GYN exams  ? BREAST BIOPSY Left 10/18/2013  ? BREAST BIOPSY Left 10/31/2013  ? BREAST LUMPECTOMY Left 11/05/2013  ? BREAST LUMPECTOMY WITH NEEDLE LOCALIZATION AND AXILLARY LYMPH NODE DISSECTION Left 11/05/2013  ? Procedure: LEFT BREAST LUMPECTOMY WITH NEEDLE LOCALIZATION and axillary lymph Node Dissection;  Surgeon: BenjEdward Jolly;  [place] [facility] [place];  Laterality: Left;  ? BREAST SURGERY  11/2013  ? left lumpectomy  ? CARDIOVERSION N/A 10/17/2018  ? Procedure: CARDIOVERSION;  Surgeon: McLeLarey Dresser;  [facility];  Service: Cardiovascular;  Laterality: N/A;  ? CARDIOVERSION N/A 11/08/2018  ? Procedure: CARDIOVERSION;  Surgeon: McLeLarey Dresser;  [facility];  Service: Cardiovascular;  Laterality: N/A;  ? CARDIOVERSION N/A 08/13/2020  ? Procedure: CARDIOVERSION;  Surgeon: McLeLarey Dresser;  [facility];  Service: Cardiovascular;  Laterality: N/A;  ? CARDIOVERSION N/A 03/13/2021  ? Procedure: CARDIOVERSION;  Surgeon: McLeLarey Dresser;  [facility];  Service: Cardiovascular;  Laterality: N/A;  ? CARDIOVERSION N/A 11/26/2021  ? Procedure: CARDIOVERSION;  Surgeon: McLeLarey Dresser;  [facility];  Service: Cardiovascular;  Laterality: N/A;  ? CATARACT EXTRACTION W/ INTRAOCULAR LENS  IMPLANT, BILATERAL  2012  ? bilateral  ? COLONOSCOPY  11/04/2015  ? per Dr. StarFuller Planenomatous polyps, no repeats planned   ? EYE SURGERY  11/22/2006  ? cataracts, bilateral, intraocular lens implant  ? INTRAMEDULLARY (  IM) NAIL INTERTROCHANTERIC Right 08/23/2021  ? Procedure: INTRAMEDULLARY (IM) NAIL INTERTROCHANTRIC;  Surgeon: Paralee Cancel, MD;  Location: [place];  Service: Orthopedics;  Laterality: Right;  ? JOINT REPLACEMENT    ? 2019  ? LEFT HEART CATHETERIZATION WITH CORONARY ANGIOGRAM N/A 12/19/2013  ? Procedure: LEFT HEART CATHETERIZATION WITH CORONARY ANGIOGRAM;  Surgeon: Burnell Blanks, MD;  Location: [facility] CATH LAB;  Service: Cardiovascular;  Laterality: N/A;  ? lumbar epidural steroid injection    ? lumber spinal stenosis  ? LUMBAR LAMINECTOMY/DECOMPRESSION MICRODISCECTOMY Right 03/01/2018  ? Procedure: RIGHT LUMBAR FOUR- LUMBAR FIVE LAMINECTOMY/MICRODISCECTOMY;  Surgeon: [name], MD;  Location: [place];  Service: Neurosurgery;  Laterality: Right;  ? MITRAL VALVE  REPAIR N/A 12/06/2018  ? Procedure: MITRAL VALVE REPAIR;  Surgeon: Sherren Mocha, MD;  Location: [place] CV LAB;  Service: Cardiovascular;  Laterality: N/A;  ? POLYPECTOMY    ? PORTACATH PLACEMENT Right 3/10/2

## 2021-12-17 ENCOUNTER — Ambulatory Visit
Admission: RE | Admit: 2021-12-17 | Discharge: 2021-12-17 | Disposition: A | Discharge status: Home / Self Care | Payer: Medicare PPO | Source: Ambulatory Visit | Attending: Radiation Oncology | Admitting: Radiation Oncology

## 2021-12-17 ENCOUNTER — Telehealth: Payer: Self-pay | Admitting: Radiation Oncology

## 2021-12-17 ENCOUNTER — Ambulatory Visit: Payer: Medicare PPO

## 2021-12-17 DIAGNOSIS — S72141D Displaced intertrochanteric fracture of right femur, subsequent encounter for closed fracture with routine healing: Secondary | ICD-10-CM | POA: Diagnosis not present

## 2021-12-17 DIAGNOSIS — Z96651 Presence of right artificial knee joint: Secondary | ICD-10-CM | POA: Diagnosis not present

## 2021-12-17 DIAGNOSIS — M1712 Unilateral primary osteoarthritis, left knee: Secondary | ICD-10-CM | POA: Diagnosis not present

## 2021-12-17 NOTE — Telephone Encounter (Signed)
LVM to r/s missed appt.

## 2021-12-18 ENCOUNTER — Telehealth: Payer: Self-pay | Admitting: Radiation Oncology

## 2021-12-18 NOTE — Telephone Encounter (Signed)
Called patient's daughter Aris [place] to get the patient's consultation w. Dr. Sondra Come rescheduled. No answer, LVM for a return call.  ?

## 2021-12-18 NOTE — Telephone Encounter (Signed)
LVM to r/s missed 3/16 appt with Dr. Sondra Come ?

## 2021-12-21 ENCOUNTER — Telehealth: Payer: Self-pay | Admitting: Radiation Oncology

## 2021-12-21 DIAGNOSIS — E1142 Type 2 diabetes mellitus with diabetic polyneuropathy: Secondary | ICD-10-CM | POA: Diagnosis not present

## 2021-12-21 DIAGNOSIS — M6281 Muscle weakness (generalized): Secondary | ICD-10-CM | POA: Diagnosis not present

## 2021-12-21 DIAGNOSIS — S72109D Unspecified trochanteric fracture of unspecified femur, subsequent encounter for closed fracture with routine healing: Secondary | ICD-10-CM | POA: Diagnosis not present

## 2021-12-21 NOTE — Telephone Encounter (Signed)
3/20 @ 8:40 am Left voicemail for pt's daughter to call our office.  ?

## 2021-12-21 NOTE — Telephone Encounter (Signed)
3/20 @ 3:34 pm left voicemail for patient to call our office to reschedule her appointment.   ?

## 2021-12-23 DIAGNOSIS — M6281 Muscle weakness (generalized): Secondary | ICD-10-CM | POA: Diagnosis not present

## 2021-12-23 DIAGNOSIS — E1142 Type 2 diabetes mellitus with diabetic polyneuropathy: Secondary | ICD-10-CM | POA: Diagnosis not present

## 2021-12-23 DIAGNOSIS — S72109D Unspecified trochanteric fracture of unspecified femur, subsequent encounter for closed fracture with routine healing: Secondary | ICD-10-CM | POA: Diagnosis not present

## 2021-12-24 ENCOUNTER — Other Ambulatory Visit: Payer: Medicare PPO

## 2021-12-24 ENCOUNTER — Telehealth: Payer: Self-pay | Admitting: Radiation Oncology

## 2021-12-24 ENCOUNTER — Ambulatory Visit: Payer: Medicare PPO | Admitting: Hematology & Oncology

## 2021-12-24 NOTE — Telephone Encounter (Signed)
Patient's daughter called back. F/U call 3/23 @ 10:02 am left voicemail with patient's daughter to reschedule patient appointment.   ?

## 2021-12-25 DIAGNOSIS — M6281 Muscle weakness (generalized): Secondary | ICD-10-CM | POA: Diagnosis not present

## 2021-12-25 DIAGNOSIS — S72109D Unspecified trochanteric fracture of unspecified femur, subsequent encounter for closed fracture with routine healing: Secondary | ICD-10-CM | POA: Diagnosis not present

## 2021-12-25 DIAGNOSIS — E1142 Type 2 diabetes mellitus with diabetic polyneuropathy: Secondary | ICD-10-CM | POA: Diagnosis not present

## 2021-12-28 DIAGNOSIS — S72109D Unspecified trochanteric fracture of unspecified femur, subsequent encounter for closed fracture with routine healing: Secondary | ICD-10-CM | POA: Diagnosis not present

## 2021-12-28 DIAGNOSIS — M6281 Muscle weakness (generalized): Secondary | ICD-10-CM | POA: Diagnosis not present

## 2021-12-28 DIAGNOSIS — E1142 Type 2 diabetes mellitus with diabetic polyneuropathy: Secondary | ICD-10-CM | POA: Diagnosis not present

## 2021-12-29 ENCOUNTER — Ambulatory Visit (HOSPITAL_COMMUNITY)
Admission: RE | Admit: 2021-12-29 | Discharge: 2021-12-29 | Disposition: A | Discharge status: Home / Self Care | Payer: Medicare PPO | Source: Ambulatory Visit | Attending: Nurse Practitioner | Admitting: Nurse Practitioner

## 2021-12-29 ENCOUNTER — Other Ambulatory Visit: Payer: Self-pay

## 2021-12-29 VITALS — BP 116/64 | HR 89 | Ht 66.0 in | Wt 139.6 lb

## 2021-12-29 DIAGNOSIS — I110 Hypertensive heart disease with heart failure: Secondary | ICD-10-CM | POA: Insufficient documentation

## 2021-12-29 DIAGNOSIS — Z9181 History of falling: Secondary | ICD-10-CM | POA: Insufficient documentation

## 2021-12-29 DIAGNOSIS — I340 Nonrheumatic mitral (valve) insufficiency: Secondary | ICD-10-CM | POA: Diagnosis not present

## 2021-12-29 DIAGNOSIS — R9431 Abnormal electrocardiogram [ECG] [EKG]: Secondary | ICD-10-CM | POA: Insufficient documentation

## 2021-12-29 DIAGNOSIS — Z79899 Other long term (current) drug therapy: Secondary | ICD-10-CM | POA: Diagnosis not present

## 2021-12-29 DIAGNOSIS — I509 Heart failure, unspecified: Secondary | ICD-10-CM | POA: Insufficient documentation

## 2021-12-29 DIAGNOSIS — I4819 Other persistent atrial fibrillation: Secondary | ICD-10-CM | POA: Diagnosis not present

## 2021-12-29 DIAGNOSIS — I421 Obstructive hypertrophic cardiomyopathy: Secondary | ICD-10-CM | POA: Insufficient documentation

## 2021-12-29 DIAGNOSIS — Z7901 Long term (current) use of anticoagulants: Secondary | ICD-10-CM | POA: Insufficient documentation

## 2021-12-29 DIAGNOSIS — I2510 Atherosclerotic heart disease of native coronary artery without angina pectoris: Secondary | ICD-10-CM | POA: Insufficient documentation

## 2021-12-29 DIAGNOSIS — D6869 Other thrombophilia: Secondary | ICD-10-CM

## 2021-12-29 NOTE — Progress Notes (Signed)
? ?Primary Care Physician: Sueanne Margarita, DO ?Referring Physician: Dr. Regan Rakers Clegg,NP ? ? ?Kim [name] is a 87 y.o. female with a h/o  CAD,  atrial fibrillation, on amiodarone, and mitral regurgitation, s/p [place], presents for evaluation in the afib clinic for persistent afib and falls, ? Watchman.  Patient additionally has a history of HOCM.  This has been recognized on prior echoes.  She has severe asymmetric basal septal hypertrophy and SAM with LVOT gradient peak 58 mmHg. she had a cardioversion late February for breakthrough afib and was found to be back in rate controlled afib at a recent HF clinic appointment from which she states she is asymptomatic. She has had 3 recent mechanical falls.  ? ?Today, she denies symptoms of palpitations, chest pain, shortness of breath, orthopnea, PND, lower extremity edema, dizziness, presyncope, syncope, or neurologic sequela. The patient is tolerating medications without difficulties and is otherwise without complaint today.  ? ?Past Medical History:  ?Diagnosis Date  ? Allergic rhinitis   ? Allergy   ? Arthritis   ? hands  ? Breast cancer ([place])   ? Breast cancer of upper-outer quadrant of left female breast ([place]) 11/08/13  ? finished chemo and radiation 2015  ? CAD (coronary artery disease)   ? a. 12/2013 Cath/PCI: LM nl, LAD 20p, 74m 30d, D1 30p, LCX 20p, 352mOM1 20, Mo2 40p, RCA 30p, 9945m.0x16 Promus Premier DES), 20d, RPL 30, RPDA 70p, 62m61m 65-70%.  ? CHF (congestive heart failure) (HCC)[place]? Diverticulosis   ? HOH (hard of hearing)   ? bilateral, wears hearing aids  ? Hx of adenomatous colonic polyps 02/1999  ? Hyperlipidemia   ? Hypertension   ? Hypertrophic obstructive cardiomyopathy (HOCM) (HCC)[place]? Cardiologist is Dr. DaltLoralie ChampagneHypothyroidism   ? Iron deficiency anemia due to chronic blood loss 01/07/2016  ? Low back pain   ? gets ESI from Dr. Max Rennis HardingMalabsorption of iron 01/07/2016  ? [facility]  ? very mild-with  first chemo -placed stent x1  ? Persistent atrial fibrillation with rapid ventricular response (HCC)[place]11/2020  ? Personal history of radiation therapy 2015  ? S/P radiation therapy 05/14/2014-06/26/2014  ? 1) Left Breast  / 50 Gy in 25 fractions, 2) Left Supraclavicular fossa/ 47.5 Gy in 25 fractions, 3) Left Posterior Axillary boost / 4.825 Gy in 25 fractions, 4) Left Breast boost / 10 Gy in 5 fractions   ? Wears hearing aid   ? left and right   ? ?Past Surgical History:  ?Procedure Laterality Date  ? ABDOMINAL HYSTERECTOMY    ? ANTERIOR AND POSTERIOR REPAIR N/A 01/17/2015  ? Procedure: CYSTOCELE REPAIR ;  Surgeon: MariPrincess Bruins;  [facility];  Service: Gynecology;  Laterality: N/A;  ? BILATERAL SALPINGOOPHORECTOMY    ? see BethLaurin Coderfor GYN exams  ? BREAST BIOPSY Left 10/18/2013  ? BREAST BIOPSY Left 10/31/2013  ? BREAST LUMPECTOMY Left 11/05/2013  ? BREAST LUMPECTOMY WITH NEEDLE LOCALIZATION AND AXILLARY LYMPH NODE DISSECTION Left 11/05/2013  ? Procedure: LEFT BREAST LUMPECTOMY WITH NEEDLE LOCALIZATION and axillary lymph Node Dissection;  Surgeon: BenjEdward Jolly;  [facility];  Laterality: Left;  ? BREAST SURGERY  11/2013  ? left lumpectomy  ? CARDIOVERSION N/A 10/17/2018  ? Procedure: CARDIOVERSION;  Surgeon: McLeLarey Dresser;  [facility];  Service: Cardiovascular;  Laterality: N/A;  ? CARDIOVERSION N/A  11/08/2018  ? Procedure: CARDIOVERSION;  Surgeon: Larey Dresser, MD;  Location: [facility] ENDOSCOPY;  Service: Cardiovascular;  Laterality: N/A;  ? CARDIOVERSION N/A 08/13/2020  ? Procedure: CARDIOVERSION;  Surgeon: Larey Dresser, MD;  Location: [facility] ENDOSCOPY;  Service: Cardiovascular;  Laterality: N/A;  ? CARDIOVERSION N/A 03/13/2021  ? Procedure: CARDIOVERSION;  Surgeon: Larey Dresser, MD;  Location: [facility] ENDOSCOPY;  Service: Cardiovascular;  Laterality: N/A;  ? CARDIOVERSION N/A 11/26/2021  ? Procedure: CARDIOVERSION;  Surgeon: Larey Dresser, MD;  Location: [facility] ENDOSCOPY;  Service: Cardiovascular;  Laterality: N/A;  ? CATARACT EXTRACTION W/ INTRAOCULAR LENS  IMPLANT, BILATERAL  2012  ? bilateral  ? COLONOSCOPY  11/04/2015  ? per Dr. Fuller Plan, adenomatous polyps, no repeats planned   ? EYE SURGERY  11/22/2006  ? cataracts, bilateral, intraocular lens implant  ? INTRAMEDULLARY (IM) NAIL INTERTROCHANTERIC Right 08/23/2021  ? Procedure: INTRAMEDULLARY (IM) NAIL INTERTROCHANTRIC;  Surgeon: Paralee Cancel, MD;  Location: [place];  Service: Orthopedics;  Laterality: Right;  ? JOINT REPLACEMENT    ? 2019  ? LEFT HEART CATHETERIZATION WITH CORONARY ANGIOGRAM N/A 12/19/2013  ? Procedure: LEFT HEART CATHETERIZATION WITH CORONARY ANGIOGRAM;  Surgeon: Burnell Blanks, MD;  Location: [facility] CATH LAB;  Service: Cardiovascular;  Laterality: N/A;  ? lumbar epidural steroid injection    ? lumber spinal stenosis  ? LUMBAR LAMINECTOMY/DECOMPRESSION MICRODISCECTOMY Right 03/01/2018  ? Procedure: RIGHT LUMBAR FOUR- LUMBAR FIVE LAMINECTOMY/MICRODISCECTOMY;  Surgeon: [name], MD;  Location: [place];  Service: Neurosurgery;  Laterality: Right;  ? MITRAL VALVE REPAIR N/A 12/06/2018  ? Procedure: MITRAL VALVE REPAIR;  Surgeon: Sherren Mocha, MD;  Location: [place] CV LAB;  Service: Cardiovascular;  Laterality: N/A;  ? POLYPECTOMY    ? PORTACATH PLACEMENT Right 12/11/2013  ? Procedure: INSERTION PORT-A-CATH;  Surgeon: Edward Jolly, MD;  Location: [place];  Service: General;  Laterality: Right;  Subclavian Vein; ?  ? RIGHT/LEFT HEART CATH AND CORONARY ANGIOGRAPHY N/A 11/03/2018  ? Procedure: RIGHT/LEFT HEART CATH AND CORONARY ANGIOGRAPHY;  Surgeon: Larey Dresser, MD;  Location: [place] CV LAB;  Service: Cardiovascular;  Laterality: N/A;  ? TEE WITHOUT CARDIOVERSION N/A 10/17/2018  ? Procedure: TRANSESOPHAGEAL ECHOCARDIOGRAM (TEE);  Surgeon: Larey Dresser, MD;  Location: [facility] ENDOSCOPY;  Service: Cardiovascular;  Laterality: N/A;  ? TEE  WITHOUT CARDIOVERSION N/A 11/08/2018  ? Procedure: TRANSESOPHAGEAL ECHOCARDIOGRAM (TEE);  Surgeon: Larey Dresser, MD;  Location: [facility] ENDOSCOPY;  Service: Cardiovascular;  Laterality: N/A;  ? TOTAL KNEE ARTHROPLASTY Right 10/24/2017  ? Procedure: TOTAL RIGHT KNEE ARTHROPLASTY;  Surgeon: Gaynelle Arabian, MD;  [facility];  Service: Orthopedics;  Laterality: Right;  ? ? ?Current Outpatient Medications  ?Medication Sig Dispense Refill  ? acetaminophen (TYLENOL) 500 MG tablet Take 1,000 mg by mouth in the morning, at noon, and at bedtime.    ? amiodarone (PACERONE) 200 MG tablet Take 1 tablet (200 mg total) by mouth daily. 90 tablet 3  ? diclofenac Sodium (VOLTAREN) 1 % GEL Apply 4 g topically 2 (two) times daily. (0800 & 2000) Apply to right knee    ? diltiazem (CARDIZEM CD) 240 MG 24 hr capsule Take 1 capsule (240 mg total) by mouth daily.    ? ELIQUIS 5 MG TABS tablet TAKE 1 TABLET BY MOUTH TWICE (2) DAILY 180 tablet 1  ? exemestane (AROMASIN) 25 MG tablet TAKE 1 TABLET BY MOUTH ONCE DAILY AFTER BREAKFAST 90 tablet 3  ? ferrous gluconate (FERGON) 324 MG tablet Take 324 mg by  mouth daily with lunch.    ? furosemide (LASIX) 20 MG tablet Take 1 tablet (20 mg total) by mouth daily. 90 tablet 3  ? HYDROcodone-acetaminophen (NORCO/VICODIN) 5-325 MG tablet Take 1 tablet by mouth every 4 (four) hours as needed for moderate pain.    ? Infant [employer] [facility]) OINT Apply topically as needed.    ? levothyroxine (SYNTHROID) 137 MCG tablet Take 137 mcg by mouth daily before breakfast.    ? methocarbamol (ROBAXIN) 500 MG tablet Take 1 tablet (500 mg total) by mouth every 8 (eight) hours as needed for muscle spasms. 30 tablet 0  ? metoprolol succinate (TOPROL-XL) 50 MG 24 hr tablet Take 50 mg by mouth daily. Take with or immediately following a meal.    ? Multiple Vitamin (MULTIVITAMIN WITH MINERALS) TABS tablet Take 1 tablet by mouth daily.    ? nitroGLYCERIN (NITROSTAT) 0.4 MG SL tablet Place 1 tablet (0.4 mg total)  under the tongue every 5 (five) minutes as needed for chest pain. 100 tablet 3  ? polyethylene glycol (MIRALAX / GLYCOLAX) 17 g packet Take 17 g by mouth daily. (Patient taking differently: Take 17 g by mouth

## 2021-12-30 ENCOUNTER — Encounter (HOSPITAL_COMMUNITY): Payer: Self-pay | Admitting: Nurse Practitioner

## 2021-12-30 DIAGNOSIS — S72109D Unspecified trochanteric fracture of unspecified femur, subsequent encounter for closed fracture with routine healing: Secondary | ICD-10-CM | POA: Diagnosis not present

## 2021-12-30 DIAGNOSIS — E1142 Type 2 diabetes mellitus with diabetic polyneuropathy: Secondary | ICD-10-CM | POA: Diagnosis not present

## 2021-12-30 DIAGNOSIS — M6281 Muscle weakness (generalized): Secondary | ICD-10-CM | POA: Diagnosis not present

## 2021-12-31 DIAGNOSIS — F0390 Unspecified dementia without behavioral disturbance: Secondary | ICD-10-CM | POA: Diagnosis not present

## 2021-12-31 DIAGNOSIS — F322 Major depressive disorder, single episode, severe without psychotic features: Secondary | ICD-10-CM | POA: Diagnosis not present

## 2021-12-31 DIAGNOSIS — D692 Other nonthrombocytopenic purpura: Secondary | ICD-10-CM | POA: Diagnosis not present

## 2021-12-31 DIAGNOSIS — M25562 Pain in left knee: Secondary | ICD-10-CM | POA: Diagnosis not present

## 2021-12-31 DIAGNOSIS — M810 Age-related osteoporosis without current pathological fracture: Secondary | ICD-10-CM | POA: Diagnosis not present

## 2021-12-31 DIAGNOSIS — E785 Hyperlipidemia, unspecified: Secondary | ICD-10-CM | POA: Diagnosis not present

## 2021-12-31 DIAGNOSIS — N1831 Chronic kidney disease, stage 3a: Secondary | ICD-10-CM | POA: Diagnosis not present

## 2021-12-31 DIAGNOSIS — E538 Deficiency of other specified B group vitamins: Secondary | ICD-10-CM | POA: Diagnosis not present

## 2021-12-31 DIAGNOSIS — M549 Dorsalgia, unspecified: Secondary | ICD-10-CM | POA: Diagnosis not present

## 2022-01-01 DIAGNOSIS — M6281 Muscle weakness (generalized): Secondary | ICD-10-CM | POA: Diagnosis not present

## 2022-01-01 DIAGNOSIS — S72109D Unspecified trochanteric fracture of unspecified femur, subsequent encounter for closed fracture with routine healing: Secondary | ICD-10-CM | POA: Diagnosis not present

## 2022-01-01 DIAGNOSIS — E1142 Type 2 diabetes mellitus with diabetic polyneuropathy: Secondary | ICD-10-CM | POA: Diagnosis not present

## 2022-01-04 DIAGNOSIS — M6281 Muscle weakness (generalized): Secondary | ICD-10-CM | POA: Diagnosis not present

## 2022-01-04 DIAGNOSIS — S72109D Unspecified trochanteric fracture of unspecified femur, subsequent encounter for closed fracture with routine healing: Secondary | ICD-10-CM | POA: Diagnosis not present

## 2022-01-04 DIAGNOSIS — E1142 Type 2 diabetes mellitus with diabetic polyneuropathy: Secondary | ICD-10-CM | POA: Diagnosis not present

## 2022-01-05 DIAGNOSIS — S72109D Unspecified trochanteric fracture of unspecified femur, subsequent encounter for closed fracture with routine healing: Secondary | ICD-10-CM | POA: Diagnosis not present

## 2022-01-05 DIAGNOSIS — E1142 Type 2 diabetes mellitus with diabetic polyneuropathy: Secondary | ICD-10-CM | POA: Diagnosis not present

## 2022-01-05 DIAGNOSIS — M6281 Muscle weakness (generalized): Secondary | ICD-10-CM | POA: Diagnosis not present

## 2022-01-07 ENCOUNTER — Other Ambulatory Visit (HOSPITAL_COMMUNITY): Payer: Self-pay | Admitting: Cardiology

## 2022-01-07 DIAGNOSIS — S72109D Unspecified trochanteric fracture of unspecified femur, subsequent encounter for closed fracture with routine healing: Secondary | ICD-10-CM | POA: Diagnosis not present

## 2022-01-07 DIAGNOSIS — M6281 Muscle weakness (generalized): Secondary | ICD-10-CM | POA: Diagnosis not present

## 2022-01-07 DIAGNOSIS — E1142 Type 2 diabetes mellitus with diabetic polyneuropathy: Secondary | ICD-10-CM | POA: Diagnosis not present

## 2022-01-08 DIAGNOSIS — I480 Paroxysmal atrial fibrillation: Secondary | ICD-10-CM | POA: Diagnosis not present

## 2022-01-08 DIAGNOSIS — I10 Essential (primary) hypertension: Secondary | ICD-10-CM | POA: Diagnosis not present

## 2022-01-12 DIAGNOSIS — S72109D Unspecified trochanteric fracture of unspecified femur, subsequent encounter for closed fracture with routine healing: Secondary | ICD-10-CM | POA: Diagnosis not present

## 2022-01-12 DIAGNOSIS — E1142 Type 2 diabetes mellitus with diabetic polyneuropathy: Secondary | ICD-10-CM | POA: Diagnosis not present

## 2022-01-12 DIAGNOSIS — M6281 Muscle weakness (generalized): Secondary | ICD-10-CM | POA: Diagnosis not present

## 2022-01-14 ENCOUNTER — Telehealth: Payer: Self-pay | Admitting: Radiation Oncology

## 2022-01-14 DIAGNOSIS — E1142 Type 2 diabetes mellitus with diabetic polyneuropathy: Secondary | ICD-10-CM | POA: Diagnosis not present

## 2022-01-14 DIAGNOSIS — S72109D Unspecified trochanteric fracture of unspecified femur, subsequent encounter for closed fracture with routine healing: Secondary | ICD-10-CM | POA: Diagnosis not present

## 2022-01-14 DIAGNOSIS — M6281 Muscle weakness (generalized): Secondary | ICD-10-CM | POA: Diagnosis not present

## 2022-01-14 NOTE — Telephone Encounter (Signed)
LVM for pt's daughter about r/s consult with Dr. Sondra Come. If she c/b, please transfer to main line [phone]  ?

## 2022-01-19 DIAGNOSIS — M6281 Muscle weakness (generalized): Secondary | ICD-10-CM | POA: Diagnosis not present

## 2022-01-19 DIAGNOSIS — E1142 Type 2 diabetes mellitus with diabetic polyneuropathy: Secondary | ICD-10-CM | POA: Diagnosis not present

## 2022-01-19 DIAGNOSIS — S72109D Unspecified trochanteric fracture of unspecified femur, subsequent encounter for closed fracture with routine healing: Secondary | ICD-10-CM | POA: Diagnosis not present

## 2022-01-21 ENCOUNTER — Inpatient Hospital Stay: Payer: Medicare PPO

## 2022-01-21 ENCOUNTER — Inpatient Hospital Stay: Payer: Medicare PPO | Admitting: Hematology & Oncology

## 2022-01-21 DIAGNOSIS — M6281 Muscle weakness (generalized): Secondary | ICD-10-CM | POA: Diagnosis not present

## 2022-01-21 DIAGNOSIS — E1142 Type 2 diabetes mellitus with diabetic polyneuropathy: Secondary | ICD-10-CM | POA: Diagnosis not present

## 2022-01-21 DIAGNOSIS — S72109D Unspecified trochanteric fracture of unspecified femur, subsequent encounter for closed fracture with routine healing: Secondary | ICD-10-CM | POA: Diagnosis not present

## 2022-01-22 ENCOUNTER — Telehealth (HOSPITAL_COMMUNITY): Payer: Self-pay | Admitting: Cardiology

## 2022-01-22 NOTE — Telephone Encounter (Signed)
Patient daughter called to request diuretic decreased  ? ?Repots patient UOP has increased a lot  ? ?Would like to even hold for a day or so ? ?[facility]  ? ?

## 2022-01-22 NOTE — Telephone Encounter (Signed)
Decrease Lasix and KCl to every other day.  ?

## 2022-01-22 NOTE — Telephone Encounter (Signed)
Order faxed to [phone] ?

## 2022-01-25 ENCOUNTER — Encounter: Payer: Self-pay | Admitting: Radiation Oncology

## 2022-01-25 NOTE — Progress Notes (Signed)
Location of tumor and Histology per Pathology Report: LUL nodule ? ?Biopsy: no biopsy performed. Hypermetabolic nodule seen on PET ? ?Past/Anticipated interventions by surgeon, if any:  none ? ?Past/Anticipated interventions by medical oncology, if any: Dr Marin Olp ?Current Therapy:        ?Status post cycle 4 of Abraxane/Herceptin ?Maintenance Herceptin q 3wk - finish 03/25/2015 ?Zometa 4 mg IV q 6 months - due in 03/2022  ?Aromasin 25 mg by mouth daily  - start on 01/10/2017  ?Feraheme as needed ? ? ? ?Pain issues, if any:  no  ? ?SAFETY ISSUES: ?Prior radiation? Left breast 2015 ?Pacemaker/ICD? no ?Possible current pregnancy? no ?Is the patient on methotrexate? no ? ?Current Complaints / other details:   ?Principle Diagnosis: Stage IIA (T1N1M0) adenocarcinoma of the left breast-triple positive ?   ? ?Vitals:  ? 02/03/22 0843  ?BP: 136/88  ?Pulse: 87  ?Resp: 18  ?Temp: (!) 96 ?F (35.6 ?C)  ?TempSrc: Temporal  ?SpO2: 98%  ?Weight: 142 lb 6 oz (64.6 kg)  ?Height: '5\' 6"'$  (1.676 m)  ? ? ? ?

## 2022-01-26 ENCOUNTER — Other Ambulatory Visit (HOSPITAL_COMMUNITY): Payer: Self-pay | Admitting: Cardiology

## 2022-01-26 DIAGNOSIS — S72109D Unspecified trochanteric fracture of unspecified femur, subsequent encounter for closed fracture with routine healing: Secondary | ICD-10-CM | POA: Diagnosis not present

## 2022-01-26 DIAGNOSIS — E1142 Type 2 diabetes mellitus with diabetic polyneuropathy: Secondary | ICD-10-CM | POA: Diagnosis not present

## 2022-01-26 DIAGNOSIS — M6281 Muscle weakness (generalized): Secondary | ICD-10-CM | POA: Diagnosis not present

## 2022-01-27 ENCOUNTER — Ambulatory Visit: Payer: Medicare PPO | Admitting: Radiation Oncology

## 2022-01-27 ENCOUNTER — Ambulatory Visit: Payer: Medicare PPO

## 2022-01-28 DIAGNOSIS — M6281 Muscle weakness (generalized): Secondary | ICD-10-CM | POA: Diagnosis not present

## 2022-01-28 DIAGNOSIS — E1142 Type 2 diabetes mellitus with diabetic polyneuropathy: Secondary | ICD-10-CM | POA: Diagnosis not present

## 2022-01-28 DIAGNOSIS — S72109D Unspecified trochanteric fracture of unspecified femur, subsequent encounter for closed fracture with routine healing: Secondary | ICD-10-CM | POA: Diagnosis not present

## 2022-02-02 DIAGNOSIS — S72109D Unspecified trochanteric fracture of unspecified femur, subsequent encounter for closed fracture with routine healing: Secondary | ICD-10-CM | POA: Diagnosis not present

## 2022-02-02 DIAGNOSIS — M6281 Muscle weakness (generalized): Secondary | ICD-10-CM | POA: Diagnosis not present

## 2022-02-02 DIAGNOSIS — E1142 Type 2 diabetes mellitus with diabetic polyneuropathy: Secondary | ICD-10-CM | POA: Diagnosis not present

## 2022-02-02 NOTE — Progress Notes (Signed)
?Radiation Oncology         [phone] ?________________________________ ? ?Initial Outpatient Consultation ? ?Name: Kim [name] MRN: [id number]  ?Date: 02/03/2022  DOB: 07-30-1935 ? ?SF:KCLEXN, Liane Comber, DO  [name], MD  ? ?REFERRING PHYSICIAN: [name], MD ? ?DIAGNOSIS: The primary encounter diagnosis was Malignant neoplasm of upper-outer quadrant of left breast in female, estrogen receptor positive ([place]). A diagnosis of Nodule of upper lobe of left lung was also pertinent to this visit. ? ?LUL nodule, PET positive  ? ?History of left breast DCIS in 2015; s/p left lumpectomy, chemotherapy, XRT and antiestrogens ? ?HISTORY OF PRESENT ILLNESS::Kim [name] is a 87 y.o. female who is accompanied by by her daughter. she is seen as a courtesy of Dr. Marin Olp for an opinion concerning radiation therapy as part of management for her recently diagnosed LUL nodule. The patient has a cancer history significant for left breast DCIS with lymph node metastasis (4+ positive nodes) diagnosed in 2015, s/p left lumpectomy, chemotherapy, XRT and antiestrogens. Since completing treatment for breast cancer, the patient maintained surveillance under Dr. Burr Medico and Dr. Marin Olp.  ? ?During a follow-up visit with Dr. Marin Olp on 10/01/22, the patient reported new onset of left lateral anterior chest wall pain. For further evaluation, Dr. Marin Olp ordered a chest CT on 10/15/21 which revealed interval enlargement (since CT in 2015) of a nodule in the posterior aspect of the left upper lobe, highly concerning for slow growing primary bronchogenic neoplasm such as a bronchogenic adenocarcinoma. Metastatic disease was noted as a less likely differential given the nodules slow progression since 2015. Other likely benign tiny pulmonary nodules in the lungs bilaterally were also appreciated though highly nonspecific. ? ?PET on 10/30/21 further revealed the LUL nodule as mildly hypermetabolic, measuring 11 mm, and more  worrisome for primary pulmonary neoplasm than a solitary metastasis. Otherwise, PET showed no evidence of mediastinal or hilar adenopathy, or findings suggestive of metastatic disease involving the abdomen/pelvis or bony structures. ? ?Given the patient's overall health condition, particularly with dementia, Dr. Marin Olp does not advise doing any invasive procedures for diagnostic purposes. Given so, he advises the patient to proceed with radiosurgery for management of the LUL nodule.  ? ?Of note: The patient has ongoing back pain and significant arthritis. She also suffered a fall this past November where she broke her right hip and underwent hip surgery on 08/23/21. Her fall occurred at [facility] where she still resides. This past February 2023, the patient suffered another fall at [facility] which increased her right hip/leg pain. On 11/28/21, the patient presented to the ED for further evaluation of her ongoing right leg pain. X-ray of the right femur showed no acute fracture/dislocation. Following hospital evaluation and further information provided by her nursing facility, the patient's pain was likely due to insufficient pain management, as she had run out tramadol. At discharge, her tramadol was refilled and she was sent back to her nursing facility in stable condition.  ? ? ?PREVIOUS RADIATION THERAPY: Yes - Dr. Isidore Moos in 2015 ? ?Indication for treatment:  Curative      ?Radiation treatment dates:   05/14/2014 - 06/26/2014 ?Site/dose:       ?1) Left Breast  / 50 Gy in 25 fractions ?2) Left Supraclavicular fossa/ 47.5 Gy in 25 fractions ?3) Left Posterior Axillary boost / 4.825 Gy in 25 fractions ?4) Left Breast boost / 10 Gy in 5 fractions ?Beams/energy:   ?1) Opposed tangents / 10 and 6 MV  photons ?2) Right anterior oblique / 10 MV photons ?3) PA / 6MV photons ?4) En face electrons / 12 MeV electrons ? ?PREVIOUS OTHER THERAPY: ?-- Chemotherapy 12/17/2013 through 03/25/2015 consisting of 1 dose of  Carboplatin/Taxotere and Trastuzumab on 12/17/13, and 4 cycles of Abraxane and Trastuzumab 02/05/11-04/18/14. (She continue Trastuzumab until 03/25/15) ?-- Antiestrogens consisting of Exemestane 2.5 mg daily started 07/01/14 ? ?PAST MEDICAL HISTORY:  ?Past Medical History:  ?Diagnosis Date  ? Allergic rhinitis   ? Allergy   ? Arthritis   ? hands  ? Breast cancer ([place])   ? Breast cancer of upper-outer quadrant of left female breast ([place]) 11/08/2013  ? finished chemo and radiation 2015  ? CAD (coronary artery disease)   ? a. 12/2013 Cath/PCI: LM nl, LAD 20p, 51m 30d, D1 30p, LCX 20p, 335mOM1 20, Mo2 40p, RCA 30p, 9973m.0x16 Promus Premier DES), 20d, RPL 30, RPDA 70p, 53m68m 65-70%.  ? CHF (congestive heart failure) (HCC)[place]? Diverticulosis   ? HOH (hard of hearing)   ? bilateral, wears hearing aids  ? Hx of adenomatous colonic polyps 02/1999  ? Hyperlipidemia   ? Hypertension   ? Hypertrophic obstructive cardiomyopathy (HOCM) (HCC)[place]? Cardiologist is Dr. DaltLoralie ChampagneHypothyroidism   ? Iron deficiency anemia due to chronic blood loss 01/07/2016  ? Low back pain   ? gets ESI from Dr. Max Rennis HardingMalabsorption of iron 01/07/2016  ? [facility]  ? very mild-with first chemo -placed stent x1  ? Persistent atrial fibrillation with rapid ventricular response (HCC)[place]/08/2019  ? Personal history of radiation therapy 2015  ? left breast, scv, axilla  05/14/2014-06/26/2014  Dr SaraEppie GibsonS/P radiation therapy 05/14/2014-06/26/2014  ? 1) Left Breast  / 50 Gy in 25 fractions, 2) Left Supraclavicular fossa/ 47.5 Gy in 25 fractions, 3) Left Posterior Axillary boost / 4.825 Gy in 25 fractions, 4) Left Breast boost / 10 Gy in 5 fractions   ? Wears hearing aid   ? left and right   ? ? ?PAST SURGICAL HISTORY: ?Past Surgical History:  ?Procedure Laterality Date  ? ABDOMINAL HYSTERECTOMY    ? ANTERIOR AND POSTERIOR REPAIR N/A 01/17/2015  ? Procedure: CYSTOCELE REPAIR ;  Surgeon: MariPrincess Bruins;   [facility];  Service: Gynecology;  Laterality: N/A;  ? BILATERAL SALPINGOOPHORECTOMY    ? see BethLaurin Coderfor GYN exams  ? BREAST BIOPSY Left 10/18/2013  ? BREAST BIOPSY Left 10/31/2013  ? BREAST LUMPECTOMY Left 11/05/2013  ? BREAST LUMPECTOMY WITH NEEDLE LOCALIZATION AND AXILLARY LYMPH NODE DISSECTION Left 11/05/2013  ? Procedure: LEFT BREAST LUMPECTOMY WITH NEEDLE LOCALIZATION and axillary lymph Node Dissection;  Surgeon: BenjEdward Jolly;  [place] [facility];  Laterality: Left;  ? BREAST SURGERY  11/2013  ? left lumpectomy  ? CARDIOVERSION N/A 10/17/2018  ? Procedure: CARDIOVERSION;  Surgeon: McLeLarey Dresser;  [facility];  Service: Cardiovascular;  Laterality: N/A;  ? CARDIOVERSION N/A 11/08/2018  ? Procedure: CARDIOVERSION;  Surgeon: McLeLarey Dresser;  [facility];  Service: Cardiovascular;  Laterality: N/A;  ? CARDIOVERSION N/A 08/13/2020  ? Procedure: CARDIOVERSION;  Surgeon: McLeLarey Dresser;  [facility];  Service: Cardiovascular;  Laterality: N/A;  ? CARDIOVERSION N/A 03/13/2021  ? Procedure: CARDIOVERSION;  Surgeon: McLeLarey Dresser;  [facility];  Service: Cardiovascular;  Laterality: N/A;  ? CARDIOVERSION N/A  11/26/2021  ? Procedure: CARDIOVERSION;  Surgeon: Larey Dresser, MD;  Location[facility] ENDOSCOPY;  Service: Cardiovascular;  Laterality: N/A;  ? CATARACT EXTRACTION W/ INTRAOCULAR LENS  IMPLANT, BILATERAL  2012  ? bilateral  ? COLONOSCOPY  11/04/2015  ? per Dr. Fuller Plan, adenomatous polyps, no repeats planned   ? EYE SURGERY  11/22/2006  ? cataracts, bilateral, intraocular lens implant  ? INTRAMEDULLARY (IM) NAIL INTERTROCHANTERIC Right 08/23/2021  ? Procedure: INTRAMEDULLARY (IM) NAIL INTERTROCHANTRIC;  Surgeon: Paralee Cancel, MD;  Location: [place];  Service: Orthopedics;  Laterality: Right;  ? JOINT REPLACEMENT    ? 2019  ? LEFT HEART CATHETERIZATION WITH CORONARY ANGIOGRAM N/A 12/19/2013  ? Procedure:  LEFT HEART CATHETERIZATION WITH CORONARY ANGIOGRAM;  Surgeon: Burnell Blanks, MD;  Location: [facility] C[facility] LAB;  Service: Cardiovascular;  Laterality: N/A;  ? lumbar epidural steroid injection    ? lumber spinal ste

## 2022-02-03 ENCOUNTER — Ambulatory Visit
Admission: RE | Admit: 2022-02-03 | Discharge: 2022-02-03 | Disposition: A | Discharge status: Home / Self Care | Payer: Medicare PPO | Source: Ambulatory Visit | Attending: Radiation Oncology | Admitting: Radiation Oncology

## 2022-02-03 ENCOUNTER — Encounter: Payer: Self-pay | Admitting: Radiation Oncology

## 2022-02-03 ENCOUNTER — Other Ambulatory Visit: Payer: Self-pay

## 2022-02-03 VITALS — BP 136/88 | HR 87 | Temp 96.0°F | Resp 18 | Ht 66.0 in | Wt 142.4 lb

## 2022-02-03 DIAGNOSIS — C50412 Malignant neoplasm of upper-outer quadrant of left female breast: Secondary | ICD-10-CM | POA: Diagnosis not present

## 2022-02-03 DIAGNOSIS — Z170 Estrogen receptor positive status [ER+]: Secondary | ICD-10-CM | POA: Insufficient documentation

## 2022-02-03 DIAGNOSIS — I4891 Unspecified atrial fibrillation: Secondary | ICD-10-CM | POA: Diagnosis not present

## 2022-02-03 DIAGNOSIS — Z7901 Long term (current) use of anticoagulants: Secondary | ICD-10-CM | POA: Insufficient documentation

## 2022-02-03 DIAGNOSIS — Z791 Long term (current) use of non-steroidal anti-inflammatories (NSAID): Secondary | ICD-10-CM | POA: Diagnosis not present

## 2022-02-03 DIAGNOSIS — D500 Iron deficiency anemia secondary to blood loss (chronic): Secondary | ICD-10-CM | POA: Insufficient documentation

## 2022-02-03 DIAGNOSIS — E785 Hyperlipidemia, unspecified: Secondary | ICD-10-CM | POA: Diagnosis not present

## 2022-02-03 DIAGNOSIS — I10 Essential (primary) hypertension: Secondary | ICD-10-CM | POA: Insufficient documentation

## 2022-02-03 DIAGNOSIS — Z79899 Other long term (current) drug therapy: Secondary | ICD-10-CM | POA: Diagnosis not present

## 2022-02-03 DIAGNOSIS — M6281 Muscle weakness (generalized): Secondary | ICD-10-CM | POA: Diagnosis not present

## 2022-02-03 DIAGNOSIS — E1142 Type 2 diabetes mellitus with diabetic polyneuropathy: Secondary | ICD-10-CM | POA: Diagnosis not present

## 2022-02-03 DIAGNOSIS — Z923 Personal history of irradiation: Secondary | ICD-10-CM | POA: Diagnosis not present

## 2022-02-03 DIAGNOSIS — S72109D Unspecified trochanteric fracture of unspecified femur, subsequent encounter for closed fracture with routine healing: Secondary | ICD-10-CM | POA: Diagnosis not present

## 2022-02-03 DIAGNOSIS — F0390 Unspecified dementia without behavioral disturbance: Secondary | ICD-10-CM | POA: Diagnosis not present

## 2022-02-03 DIAGNOSIS — Z800 Family history of malignant neoplasm of digestive organs: Secondary | ICD-10-CM | POA: Diagnosis not present

## 2022-02-03 DIAGNOSIS — R911 Solitary pulmonary nodule: Secondary | ICD-10-CM

## 2022-02-03 DIAGNOSIS — I2510 Atherosclerotic heart disease of native coronary artery without angina pectoris: Secondary | ICD-10-CM | POA: Insufficient documentation

## 2022-02-03 DIAGNOSIS — C3412 Malignant neoplasm of upper lobe, left bronchus or lung: Secondary | ICD-10-CM | POA: Diagnosis not present

## 2022-02-03 DIAGNOSIS — I509 Heart failure, unspecified: Secondary | ICD-10-CM | POA: Diagnosis not present

## 2022-02-03 DIAGNOSIS — E039 Hypothyroidism, unspecified: Secondary | ICD-10-CM | POA: Insufficient documentation

## 2022-02-03 DIAGNOSIS — Z79811 Long term (current) use of aromatase inhibitors: Secondary | ICD-10-CM | POA: Diagnosis not present

## 2022-02-03 NOTE — Progress Notes (Signed)
See MD note for nursing evaluation. °

## 2022-02-04 ENCOUNTER — Encounter (HOSPITAL_COMMUNITY): Payer: Medicare PPO | Admitting: Cardiology

## 2022-02-04 DIAGNOSIS — I10 Essential (primary) hypertension: Secondary | ICD-10-CM | POA: Diagnosis not present

## 2022-02-04 DIAGNOSIS — I480 Paroxysmal atrial fibrillation: Secondary | ICD-10-CM | POA: Diagnosis not present

## 2022-02-11 DIAGNOSIS — E039 Hypothyroidism, unspecified: Secondary | ICD-10-CM | POA: Diagnosis not present

## 2022-02-11 DIAGNOSIS — I4891 Unspecified atrial fibrillation: Secondary | ICD-10-CM | POA: Diagnosis not present

## 2022-02-11 DIAGNOSIS — I10 Essential (primary) hypertension: Secondary | ICD-10-CM | POA: Diagnosis not present

## 2022-02-11 DIAGNOSIS — R2681 Unsteadiness on feet: Secondary | ICD-10-CM | POA: Diagnosis not present

## 2022-02-11 DIAGNOSIS — N390 Urinary tract infection, site not specified: Secondary | ICD-10-CM | POA: Diagnosis not present

## 2022-02-11 DIAGNOSIS — R350 Frequency of micturition: Secondary | ICD-10-CM | POA: Diagnosis not present

## 2022-02-11 DIAGNOSIS — M549 Dorsalgia, unspecified: Secondary | ICD-10-CM | POA: Diagnosis not present

## 2022-02-11 DIAGNOSIS — D6869 Other thrombophilia: Secondary | ICD-10-CM | POA: Diagnosis not present

## 2022-02-11 DIAGNOSIS — I25118 Atherosclerotic heart disease of native coronary artery with other forms of angina pectoris: Secondary | ICD-10-CM | POA: Diagnosis not present

## 2022-02-11 DIAGNOSIS — C3490 Malignant neoplasm of unspecified part of unspecified bronchus or lung: Secondary | ICD-10-CM | POA: Diagnosis not present

## 2022-02-15 DIAGNOSIS — F03C0 Unspecified dementia, severe, without behavioral disturbance, psychotic disturbance, mood disturbance, and anxiety: Secondary | ICD-10-CM | POA: Diagnosis not present

## 2022-02-15 DIAGNOSIS — E785 Hyperlipidemia, unspecified: Secondary | ICD-10-CM | POA: Diagnosis not present

## 2022-02-15 DIAGNOSIS — C3490 Malignant neoplasm of unspecified part of unspecified bronchus or lung: Secondary | ICD-10-CM | POA: Diagnosis not present

## 2022-02-15 DIAGNOSIS — M25562 Pain in left knee: Secondary | ICD-10-CM | POA: Diagnosis not present

## 2022-02-15 DIAGNOSIS — E039 Hypothyroidism, unspecified: Secondary | ICD-10-CM | POA: Diagnosis not present

## 2022-02-15 DIAGNOSIS — I4891 Unspecified atrial fibrillation: Secondary | ICD-10-CM | POA: Diagnosis not present

## 2022-02-15 DIAGNOSIS — N1832 Chronic kidney disease, stage 3b: Secondary | ICD-10-CM | POA: Diagnosis not present

## 2022-02-15 DIAGNOSIS — I130 Hypertensive heart and chronic kidney disease with heart failure and stage 1 through stage 4 chronic kidney disease, or unspecified chronic kidney disease: Secondary | ICD-10-CM | POA: Diagnosis not present

## 2022-02-15 DIAGNOSIS — I509 Heart failure, unspecified: Secondary | ICD-10-CM | POA: Diagnosis not present

## 2022-03-11 ENCOUNTER — Emergency Department (HOSPITAL_COMMUNITY): Payer: Medicare PPO

## 2022-03-11 ENCOUNTER — Encounter (HOSPITAL_COMMUNITY): Payer: Self-pay | Admitting: Emergency Medicine

## 2022-03-11 ENCOUNTER — Other Ambulatory Visit: Payer: Self-pay

## 2022-03-11 ENCOUNTER — Inpatient Hospital Stay (HOSPITAL_COMMUNITY)
Admission: EM | Admit: 2022-03-11 | Discharge: 2022-03-18 | DRG: 521 | Disposition: A | Discharge status: Skilled Nursing Facility | Payer: Medicare PPO | Attending: Internal Medicine | Admitting: Internal Medicine

## 2022-03-11 DIAGNOSIS — H9190 Unspecified hearing loss, unspecified ear: Secondary | ICD-10-CM | POA: Diagnosis present

## 2022-03-11 DIAGNOSIS — Z955 Presence of coronary angioplasty implant and graft: Secondary | ICD-10-CM

## 2022-03-11 DIAGNOSIS — N1831 Chronic kidney disease, stage 3a: Secondary | ICD-10-CM

## 2022-03-11 DIAGNOSIS — E871 Hypo-osmolality and hyponatremia: Secondary | ICD-10-CM | POA: Diagnosis not present

## 2022-03-11 DIAGNOSIS — D62 Acute posthemorrhagic anemia: Secondary | ICD-10-CM | POA: Diagnosis not present

## 2022-03-11 DIAGNOSIS — I340 Nonrheumatic mitral (valve) insufficiency: Secondary | ICD-10-CM | POA: Diagnosis present

## 2022-03-11 DIAGNOSIS — I4821 Permanent atrial fibrillation: Secondary | ICD-10-CM | POA: Diagnosis present

## 2022-03-11 DIAGNOSIS — Z888 Allergy status to other drugs, medicaments and biological substances status: Secondary | ICD-10-CM

## 2022-03-11 DIAGNOSIS — T84194A Other mechanical complication of internal fixation device of right femur, initial encounter: Secondary | ICD-10-CM | POA: Diagnosis not present

## 2022-03-11 DIAGNOSIS — D72829 Elevated white blood cell count, unspecified: Secondary | ICD-10-CM | POA: Diagnosis not present

## 2022-03-11 DIAGNOSIS — D7589 Other specified diseases of blood and blood-forming organs: Secondary | ICD-10-CM | POA: Diagnosis not present

## 2022-03-11 DIAGNOSIS — E039 Hypothyroidism, unspecified: Secondary | ICD-10-CM | POA: Diagnosis present

## 2022-03-11 DIAGNOSIS — Z79899 Other long term (current) drug therapy: Secondary | ICD-10-CM | POA: Diagnosis not present

## 2022-03-11 DIAGNOSIS — Y9301 Activity, walking, marching and hiking: Secondary | ICD-10-CM | POA: Diagnosis present

## 2022-03-11 DIAGNOSIS — Z96641 Presence of right artificial hip joint: Secondary | ICD-10-CM | POA: Diagnosis not present

## 2022-03-11 DIAGNOSIS — S72001A Fracture of unspecified part of neck of right femur, initial encounter for closed fracture: Secondary | ICD-10-CM | POA: Diagnosis present

## 2022-03-11 DIAGNOSIS — Z79890 Hormone replacement therapy (postmenopausal): Secondary | ICD-10-CM

## 2022-03-11 DIAGNOSIS — I130 Hypertensive heart and chronic kidney disease with heart failure and stage 1 through stage 4 chronic kidney disease, or unspecified chronic kidney disease: Secondary | ICD-10-CM | POA: Diagnosis present

## 2022-03-11 DIAGNOSIS — I10 Essential (primary) hypertension: Secondary | ICD-10-CM | POA: Diagnosis present

## 2022-03-11 DIAGNOSIS — G9341 Metabolic encephalopathy: Secondary | ICD-10-CM | POA: Diagnosis not present

## 2022-03-11 DIAGNOSIS — N179 Acute kidney failure, unspecified: Secondary | ICD-10-CM | POA: Diagnosis not present

## 2022-03-11 DIAGNOSIS — Z9841 Cataract extraction status, right eye: Secondary | ICD-10-CM

## 2022-03-11 DIAGNOSIS — R296 Repeated falls: Secondary | ICD-10-CM | POA: Diagnosis present

## 2022-03-11 DIAGNOSIS — S72141A Displaced intertrochanteric fracture of right femur, initial encounter for closed fracture: Secondary | ICD-10-CM | POA: Diagnosis present

## 2022-03-11 DIAGNOSIS — I5032 Chronic diastolic (congestive) heart failure: Secondary | ICD-10-CM | POA: Diagnosis present

## 2022-03-11 DIAGNOSIS — Z79811 Long term (current) use of aromatase inhibitors: Secondary | ICD-10-CM

## 2022-03-11 DIAGNOSIS — E8809 Other disorders of plasma-protein metabolism, not elsewhere classified: Secondary | ICD-10-CM | POA: Diagnosis present

## 2022-03-11 DIAGNOSIS — I2510 Atherosclerotic heart disease of native coronary artery without angina pectoris: Secondary | ICD-10-CM | POA: Diagnosis present

## 2022-03-11 DIAGNOSIS — E1122 Type 2 diabetes mellitus with diabetic chronic kidney disease: Secondary | ICD-10-CM | POA: Diagnosis present

## 2022-03-11 DIAGNOSIS — Z853 Personal history of malignant neoplasm of breast: Secondary | ICD-10-CM

## 2022-03-11 DIAGNOSIS — Z9181 History of falling: Secondary | ICD-10-CM

## 2022-03-11 DIAGNOSIS — E440 Moderate protein-calorie malnutrition: Secondary | ICD-10-CM | POA: Diagnosis present

## 2022-03-11 DIAGNOSIS — Z01810 Encounter for preprocedural cardiovascular examination: Secondary | ICD-10-CM

## 2022-03-11 DIAGNOSIS — Z841 Family history of disorders of kidney and ureter: Secondary | ICD-10-CM

## 2022-03-11 DIAGNOSIS — Z880 Allergy status to penicillin: Secondary | ICD-10-CM

## 2022-03-11 DIAGNOSIS — Z96651 Presence of right artificial knee joint: Secondary | ICD-10-CM | POA: Diagnosis present

## 2022-03-11 DIAGNOSIS — Z7901 Long term (current) use of anticoagulants: Secondary | ICD-10-CM

## 2022-03-11 DIAGNOSIS — Z961 Presence of intraocular lens: Secondary | ICD-10-CM | POA: Diagnosis present

## 2022-03-11 DIAGNOSIS — S72141P Displaced intertrochanteric fracture of right femur, subsequent encounter for closed fracture with malunion: Secondary | ICD-10-CM | POA: Diagnosis not present

## 2022-03-11 DIAGNOSIS — Z9842 Cataract extraction status, left eye: Secondary | ICD-10-CM

## 2022-03-11 DIAGNOSIS — Z6821 Body mass index (BMI) 21.0-21.9, adult: Secondary | ICD-10-CM

## 2022-03-11 DIAGNOSIS — Z95818 Presence of other cardiac implants and grafts: Secondary | ICD-10-CM | POA: Diagnosis not present

## 2022-03-11 DIAGNOSIS — C50919 Malignant neoplasm of unspecified site of unspecified female breast: Secondary | ICD-10-CM | POA: Diagnosis present

## 2022-03-11 DIAGNOSIS — M5441 Lumbago with sciatica, right side: Principal | ICD-10-CM

## 2022-03-11 DIAGNOSIS — W010XXA Fall on same level from slipping, tripping and stumbling without subsequent striking against object, initial encounter: Secondary | ICD-10-CM | POA: Diagnosis present

## 2022-03-11 DIAGNOSIS — R6889 Other general symptoms and signs: Secondary | ICD-10-CM | POA: Diagnosis present

## 2022-03-11 DIAGNOSIS — I252 Old myocardial infarction: Secondary | ICD-10-CM | POA: Diagnosis not present

## 2022-03-11 DIAGNOSIS — D696 Thrombocytopenia, unspecified: Secondary | ICD-10-CM | POA: Diagnosis present

## 2022-03-11 DIAGNOSIS — I421 Obstructive hypertrophic cardiomyopathy: Secondary | ICD-10-CM | POA: Diagnosis present

## 2022-03-11 DIAGNOSIS — Z923 Personal history of irradiation: Secondary | ICD-10-CM

## 2022-03-11 LAB — CBC WITH DIFFERENTIAL/PLATELET
Abs Immature Granulocytes: 0.04 10*3/uL (ref 0.00–0.07)
Basophils Absolute: 0 10*3/uL (ref 0.0–0.1)
Basophils Relative: 0 %
Eosinophils Absolute: 0.1 10*3/uL (ref 0.0–0.5)
Eosinophils Relative: 1 %
HCT: 42.4 % (ref 36.0–46.0)
Hemoglobin: 13.6 g/dL (ref 12.0–15.0)
Immature Granulocytes: 1 %
Lymphocytes Relative: 12 %
Lymphs Abs: 0.8 10*3/uL (ref 0.7–4.0)
MCH: 33.3 pg (ref 26.0–34.0)
MCHC: 32.1 g/dL (ref 30.0–36.0)
MCV: 103.7 fL — ABNORMAL HIGH (ref 80.0–100.0)
Monocytes Absolute: 0.9 10*3/uL (ref 0.1–1.0)
Monocytes Relative: 12 %
Neutro Abs: 5.4 10*3/uL (ref 1.7–7.7)
Neutrophils Relative %: 74 %
Platelets: 146 10*3/uL — ABNORMAL LOW (ref 150–400)
RBC: 4.09 MIL/uL (ref 3.87–5.11)
RDW: 12.5 % (ref 11.5–15.5)
WBC: 7.2 10*3/uL (ref 4.0–10.5)
nRBC: 0 % (ref 0.0–0.2)

## 2022-03-11 LAB — BASIC METABOLIC PANEL
Anion gap: 8 (ref 5–15)
BUN: 29 mg/dL — ABNORMAL HIGH (ref 8–23)
CO2: 22 mmol/L (ref 22–32)
Calcium: 9.2 mg/dL (ref 8.9–10.3)
Chloride: 108 mmol/L (ref 98–111)
Creatinine, Ser: 1.38 mg/dL — ABNORMAL HIGH (ref 0.44–1.00)
GFR, Estimated: 38 mL/min — ABNORMAL LOW (ref >60)
Glucose, Bld: 102 mg/dL — ABNORMAL HIGH (ref 70–99)
Potassium: 4.6 mmol/L (ref 3.5–5.1)
Sodium: 138 mmol/L (ref 135–145)

## 2022-03-11 LAB — HEPARIN LEVEL (UNFRACTIONATED): Heparin Unfractionated: >1.1 IU/mL — ABNORMAL HIGH (ref 0.30–0.70)

## 2022-03-11 LAB — PROTIME-INR
INR: 1.3 — ABNORMAL HIGH (ref 0.8–1.2)
Prothrombin Time: 16 seconds — ABNORMAL HIGH (ref 11.4–15.2)

## 2022-03-11 LAB — APTT: aPTT: 36 seconds (ref 24–36)

## 2022-03-11 MED ORDER — HEPARIN (PORCINE) 25000 UT/250ML-% IV SOLN
1100.0000 [IU]/h | INTRAVENOUS | Status: DC
  Administered 2022-03-11 – 2022-03-13 (×2): 950 [IU]/h via INTRAVENOUS
  Filled 2022-03-11 (×3): qty 250

## 2022-03-11 MED ORDER — METOPROLOL SUCCINATE ER 50 MG PO TB24
50.0000 mg | ORAL_TABLET | Freq: Every day | ORAL | Status: AC
  Administered 2022-03-11: 50 mg via ORAL
  Filled 2022-03-11: qty 1

## 2022-03-11 MED ORDER — KETOROLAC TROMETHAMINE 15 MG/ML IJ SOLN
15.0000 mg | Freq: Once | INTRAMUSCULAR | Status: AC
  Administered 2022-03-11: 15 mg via INTRAMUSCULAR
  Filled 2022-03-11: qty 1

## 2022-03-11 MED ORDER — OXYCODONE HCL 5 MG PO TABS
5.0000 mg | ORAL_TABLET | Freq: Once | ORAL | Status: AC
  Administered 2022-03-11: 5 mg via ORAL
  Filled 2022-03-11: qty 1

## 2022-03-11 MED ORDER — MORPHINE SULFATE (PF) 2 MG/ML IV SOLN
0.5000 mg | INTRAVENOUS | Status: DC | PRN
  Administered 2022-03-12 – 2022-03-13 (×4): 0.5 mg via INTRAVENOUS
  Filled 2022-03-11 (×4): qty 1

## 2022-03-11 MED ORDER — ACETAMINOPHEN 500 MG PO TABS
1000.0000 mg | ORAL_TABLET | Freq: Once | ORAL | Status: AC
  Administered 2022-03-11: 1000 mg via ORAL
  Filled 2022-03-11: qty 2

## 2022-03-11 MED ORDER — DICLOFENAC SODIUM 1 % EX GEL: 4.0000 g | Freq: Four times a day (QID) | CUTANEOUS | 0 refills | Status: DC

## 2022-03-11 MED ORDER — METHYLPREDNISOLONE 4 MG PO TBPK: ORAL_TABLET | ORAL | 0 refills | Status: DC

## 2022-03-11 NOTE — Progress Notes (Addendum)
ANTICOAGULATION CONSULT NOTE - Initial Consult  Pharmacy Consult for IV heparin  Indication: atrial fibrillation  Allergies  Allergen Reactions   Penicillins Anaphylaxis and Swelling    FACIAL SWELLING PATIENT HAS HAD A PCN REACTION WITH IMMEDIATE RASH, FACIAL/TONGUE/THROAT SWELLING, SOB, OR LIGHTHEADEDNESS WITH HYPOTENSION:  #  #  YES  #  #  Has patient had a PCN reaction causing severe rash involving mucus membranes or skin necrosis: No Has patient had a PCN reaction that required hospitalization: No Has patient had a PCN reaction occurring within the last 10 years: No   Propoxyphene N-Acetaminophen Swelling    tongue swelling   Levaquin [Levofloxacin] Hives   Repatha [Evolocumab] Other (See Comments)    Myalgias, fatigue, some itching and burning on her feet,  Arthralgia (Joint Pain)   Statins Other (See Comments)    Joint pain   Cortisone Rash   Cymbalta [Duloxetine Hcl] Itching and Rash   Myrbetriq [Mirabegron] Rash    Rash on face   Pseudoephedrine Other (See Comments)    Facial flushing    Patient Measurements: Height: '5\' 8"'$  (172.7 cm) Weight: 64.7 kg (142 lb 10.2 oz) IBW/kg (Calculated) : 63.9 Heparin Dosing Weight: TBW  Vital Signs: Temp: 98.5 F (36.9 C) (06/08 1939) Temp Source: Oral (06/08 1912) BP: 134/93 (06/08 1939) Pulse Rate: 77 (06/08 1939)  Labs: Recent Labs    03/11/22 1430 03/11/22 1808  HGB 13.6  --   HCT 42.4  --   PLT 146*  --   APTT  --  36  LABPROT  --  16.0*  INR  --  1.3*  HEPARINUNFRC  --  >1.10*  CREATININE 1.38*  --     Estimated Creatinine Clearance: 30.1 mL/min (A) (by C-G formula based on SCr of 1.38 mg/dL (H)).   Medical History: Past Medical History:  Diagnosis Date   Allergic rhinitis    Allergy    Arthritis    hands   Breast cancer ([place])    Breast cancer of upper-outer quadrant of left female breast ([place]) 11/08/2013   finished chemo and radiation 2015   CAD (coronary artery disease)    a. 12/2013 Cath/PCI: LM  nl, LAD 20p, 61m 30d, D1 30p, LCX 20p, 323mOM1 20, Mo2 40p, RCA 30p, 9986m.0x16 Promus Premier DES), 20d, RPL 30, RPDA 70p, 6m80m 65-70%.   CHF (congestive heart failure) (HCC)    Diverticulosis    HOH (hard of hearing)    bilateral, wears hearing aids   Hx of adenomatous colonic polyps 02/1999   Hyperlipidemia    Hypertension    Hypertrophic obstructive cardiomyopathy (HOCM) (HCC)[place] Cardiologist is Dr. DaltLoralie Champagneypothyroidism    Iron deficiency anemia due to chronic blood loss 01/07/2016   Low back pain    gets ESI from Dr. Max Rennis Hardingalabsorption of iron 01/07/2016   Myocardial infarction (HCC)[place]/2015   very mild-with first chemo -placed stent x1   Persistent atrial fibrillation with rapid ventricular response (HCC)[place]/08/2019   Personal history of radiation therapy 2015   left breast, scv, axilla  05/14/2014-06/26/2014  Dr SaraEppie Gibson/P radiation therapy 05/14/2014-06/26/2014   1) Left Breast  / 50 Gy in 25 fractions, 2) Left Supraclavicular fossa/ 47.5 Gy in 25 fractions, 3) Left Posterior Axillary boost / 4.825 Gy in 25 fractions, 4) Left Breast boost / 10 Gy in 5 fractions    Wears hearing aid    left and right  Medications:  PTA Apixaban '5mg'$  PO BID-last dose reported as 03/10/2022 at 1800  Assessment: [name] on Apixaban PTA for afib admitted for right femur fracture s/p fall. Apixaban held on admission in case surgery needed. Pharmacy consulted for IV heparin dosing. CBC: Hgb WNL, Pltc slightly low at 146K.   Goal of Therapy:  Heparin level 0.3-0.7 units/ml aPTT 66-102 seconds Monitor platelets by anticoagulation protocol: Yes   Plan:  Baseline PT/INR, aPTT, heparin level Start heparin infusion at 950 units/hr (no bolus due to recent Apixaban use) Heparin level falsely elevated due to recent Apixaban use. Will use aPTT for dose titration/monitoring until aPTT and heparin level correlate aPTT 8 hours after initiation Daily CBC, heparin level,  aPTT Monitor closely for s/sx of bleeding F/u ortho plans   Lindell Spar, PharmD, BCPS Clinical Pharmacist  03/11/2022,7:52 PM

## 2022-03-11 NOTE — Discharge Instructions (Addendum)
Your back pain is most likely due to a muscular strain.  There is been a lot of research on back pain, unfortunately the only thing that seems to really help is Tylenol and ibuprofen.  Relative rest is also important to not lift greater than 10 pounds bending or twisting at the waist.  Please follow-up with your family physician.  The other thing that really seems to benefit patients is physical therapy which your doctor may send you for.  Please return to the emergency department for new numbness or weakness to your arms or legs. Difficulty with urinating or urinating or pooping on yourself.  Also if you cannot feel toilet paper when you wipe or get a fever.   Use the gel as prescribed Also take tylenol 1000mg(2 extra strength) four times a day.   

## 2022-03-11 NOTE — Evaluation (Signed)
Physical Therapy Evaluation Patient Details Name: Kim [name] MRN: [id number] DOB: 1935/01/21 Today's Date: 03/11/2022  History of Present Illness  Pt admitted from home s/p fall 03/08/22 and now unable to walk and c/o R hip and knee pain.  Pt with hx of CAD, CHF, HOH, HOCM, MI, a-fib, back surgery, R TKR and R hip fx with IM nailing.  Clinical Impression  Pt admitted as above and presenting with functional mobility limitations 2* decreased R LE strength/ROM and weight bearing tolerance as well as increased R hip/knee pain with activity.  Pt assisted to sitting and to stand at bed side and side step to top of bed.  ED physician arrived in room to report he had just spoken to Dr Alvan Dame who was now recommending R hip surgery.  Further PT will be deferred until surgery completed and PT service will sign off until post op order received.     Recommendations for follow up therapy are one component of a multi-disciplinary discharge planning process, led by the attending physician.  Recommendations may be updated based on patient status, additional functional criteria and insurance authorization.  Follow Up Recommendations Other (comment) (TBD - Pt with surgery planned)    Assistance Recommended at Discharge Frequent or constant Supervision/Assistance  Patient can return home with the following  A lot of help with walking and/or transfers;A lot of help with bathing/dressing/bathroom;Assist for transportation;Assistance with cooking/housework;Help with stairs or ramp for entrance    Equipment Recommendations None recommended by PT  Recommendations for Other Services       Functional Status Assessment Patient has had a recent decline in their functional status and demonstrates the ability to make significant improvements in function in a reasonable and predictable amount of time.     Precautions / Restrictions Precautions Precautions: Fall Restrictions Weight Bearing Restrictions: No       Mobility  Bed Mobility Overal bed mobility: Needs Assistance Bed Mobility: Supine to Sit     Supine to sit: Mod assist     General bed mobility comments: modified roll and assist with bil LEs and to control trunk    Transfers Overall transfer level: Needs assistance Equipment used: Rolling walker (2 wheels) Transfers: Sit to/from Stand Sit to Stand: Min assist           General transfer comment: cues for LE management and use of UEs to self assist    Ambulation/Gait Ambulation/Gait assistance: Min assist, +2 safety/equipment Gait Distance (Feet): 3 Feet Assistive device: Rolling walker (2 wheels) Gait Pattern/deviations: Step-to pattern, Decreased step length - right, Decreased step length - left, Shuffle       General Gait Details: limited WB tolerated on R LE; pt side stepped up side of bed only to be  returned to bed and repositioned for comfort  Stairs            Wheelchair Mobility    Modified Rankin (Stroke Patients Only)       Balance Overall balance assessment: Needs assistance Sitting-balance support: No upper extremity supported, Feet unsupported Sitting balance-Leahy Scale: Good     Standing balance support: Bilateral upper extremity supported                                 Pertinent Vitals/Pain Pain Assessment Pain Assessment: 0-10 Pain Score: 6  Pain Location: R knee at rest; R hip with activity Pain Descriptors / Indicators: Aching, Grimacing, Guarding, Sore Pain Intervention(s):  Limited activity within patient's tolerance, Monitored during session, Premedicated before session    Home Living Family/patient expects to be discharged to:: Unsure                   Additional Comments: dependent on acute stay progress - during PT session, Dr arrived to inform that pt would need R hip surgery    Prior Function Prior Level of Function : Needs assist             Mobility Comments: uses rollator at  baseline ADLs Comments: has daughter and hired assist to help as needed but does not have 24/7 available     Hand Dominance   Dominant Hand: Right    Extremity/Trunk Assessment   Upper Extremity Assessment Upper Extremity Assessment: Overall WFL for tasks assessed    Lower Extremity Assessment Lower Extremity Assessment: RLE deficits/detail RLE Deficits / Details: ankle and knee ROM WFL - hip ROM restricted by pain    Cervical / Trunk Assessment Cervical / Trunk Assessment: Normal  Communication   Communication: HOH  Cognition Arousal/Alertness: Awake/alert Behavior During Therapy: WFL for tasks assessed/performed Overall Cognitive Status: Within Functional Limits for tasks assessed                                          General Comments      Exercises General Exercises - Lower Extremity Ankle Circles/Pumps: AROM, Both, 15 reps, Supine   Assessment/Plan    PT Assessment Patient needs continued PT services  PT Problem List Decreased strength;Decreased range of motion;Decreased activity tolerance;Decreased balance;Decreased mobility;Decreased knowledge of use of DME;Pain       PT Treatment Interventions DME instruction;Gait training;Functional mobility training;Therapeutic activities;Therapeutic exercise;Balance training;Patient/family education    PT Goals (Current goals can be found in the Care Plan section)  Acute Rehab PT Goals Patient Stated Goal: Regain IND PT Goal Formulation: With patient Time For Goal Achievement: 03/25/22 Potential to Achieve Goals: Fair    Frequency Min 1X/week     Co-evaluation               AM-PAC PT "6 Clicks" Mobility  Outcome Measure Help needed turning from your back to your side while in a flat bed without using bedrails?: A Lot Help needed moving from lying on your back to sitting on the side of a flat bed without using bedrails?: A Lot Help needed moving to and from a bed to a chair (including a  wheelchair)?: A Lot Help needed standing up from a chair using your arms (e.g., wheelchair or bedside chair)?: A Lot Help needed to walk in hospital room?: Total Help needed climbing 3-5 steps with a railing? : Total 6 Click Score: 10    End of Session Equipment Utilized During Treatment: Gait belt Activity Tolerance: Patient tolerated treatment well;Patient limited by pain Patient left: in bed;with call bell/phone within reach;with family/visitor present Nurse Communication: Mobility status PT Visit Diagnosis: Difficulty in walking, not elsewhere classified (R26.2);Pain Pain - Right/Left: Right Pain - part of body: Hip    Time: 6962-9528 PT Time Calculation (min) (ACUTE ONLY): 25 min   Charges:   PT Evaluation $PT Eval Low Complexity: 1 Low          [place] Pager [phone] Office [phone]   Kim [name] 03/11/2022, 2:21 PM

## 2022-03-11 NOTE — Progress Notes (Addendum)
Transition of Care [facility]) - Emergency Department Mini Assessment   Patient Details  Name: Kim [name] MRN: [id number] Date of Birth: Nov 02, 1934  Transition of [facility]) CM/SW Contact:    Roseanne Kaufman, RN Phone Number: 03/11/2022, 12:20 PM   Clinical Narrative: Patient presented to [facility] ED with c/o low back pain. RNCM received TOC consult for HHC/DME vs SNF placement.      ED Mini Assessment:  RNCM spoke with patient who is HOH and daughter at bedside. Daughter reports patient had a fall on Monday, which was not hard, patient slid out chair. [name] ( patient's daughter) reports the patient was previously in [facility] and discharged once in co-pay days. Per chart review patient was at [facility] 08/28/21-10/02/21. Patient was transferred to [facility] as private pay. Patient was discharged from [facility] to home ambulating with walker. The patient's daughter reports patient currently has HHC: PT, OT, RN with [facility] and palliative care in place. Per [facility] they are following the patient for RN & PT.The patient's daughter reports patient has private pay nurse for 6 hours in the day and 6 hours at night. Patient's daughter request SNF with [facility] for rehab. Patient's daughter reports she is unable to walk with possible pinched nerve. RNCM notified EDP regarding PT evaluation and RN re: pain medication request.   4:10 pm: RNCM received call back from [name] (patient's daughter) no additional concerns and aware patient will be admitted for surgery.   TOC will continue to follow  Patient Contact and Communications     Aris [place] (Daughter)  [phone] (Mobile)    Patient Goals and CMS Choice Patient states their goals for this hospitalization and ongoing recovery are:: "get back to walking with walker"   Expected Discharge Plan and Services Expected Discharge Plan: [place] In-house Referral: RNCM Post Acute Care Choice:  (tbd) Living arrangements for the past  2 months: Single Family Home  Admission diagnosis:  fall Patient Active Problem List   Diagnosis Date Noted   Nodule of upper lobe of left lung 02/03/2022   Acute blood loss anemia ([place]LA)    Paroxysmal atrial fibrillation with rapid ventricular response ([place]) 08/23/2021   History of breast cancer 08/23/2021   Hip fracture ([place]) 08/22/2021   Severe mitral regurgitation 12/06/2018   Persistent atrial fibrillation with rapid ventricular response ([place]) 10/14/2018   HNP (herniated nucleus pulposus), lumbar 03/01/2018   History of total knee replacement, right 11/13/2017   OA (osteoarthritis) of knee 10/24/2017   Chronic pain of right knee 06/17/2017   Osteoporosis 07/30/2016   Iron deficiency anemia due to chronic blood loss 01/07/2016   Malabsorption of iron 01/07/2016   Postoperative state 01/17/2015   HOCM (hypertrophic obstructive cardiomyopathy) ([place]) 01/09/2014   Breast cancer ([place]) 01/09/2014   Coronary atherosclerosis of native coronary artery 12/20/2013   CAD (coronary artery disease)    Hyperlipidemia    Malignant neoplasm of upper-outer quadrant of left breast in female, estrogen receptor positive ([place]) 11/05/2013   DCIS (ductal carcinoma in situ) of breast 10/26/2013   Hypertension 04/05/2012   UNSPECIFIED HEARING LOSS 08/07/2010   LOW BACK PAIN 04/22/2009   CONSTIPATION 11/04/2008   COLONIC POLYPS, HX OF 11/01/2008   ALLERGIC RHINITIS 06/27/2007   Hypothyroidism 06/22/2007   PCP:  Sueanne Margarita, DO Pharmacy:   [place], [place] [address] Phone: [phone] Fax: [place], [place] [address]  [place] [address] Phone: [phone] Fax: [phone]

## 2022-03-11 NOTE — ED Triage Notes (Signed)
The patient suffered a fall on Monday in which she slipped out of the bed. She has had soreness in her arms and legs since. Today she was unable to get out of bed. A&O x4.    HX Cancer  EMS vitals: 150/80 BP 76 HR 97 % SPO2 on room air 146 CBG

## 2022-03-11 NOTE — ED Provider Notes (Addendum)
[place] DEPT Provider Note   CSN: [id number] Arrival date & time: 03/11/22  5009     History  Chief Complaint  Patient presents with   Fall   Weakness    Kim [name] is a 87 y.o. female.  87 yo F with a chief complaints of low back pain that radiates down the right leg.  This has been going on for about a week.  Started with a fall.  The patient was walking to the bathroom with her walker and lost her balance and fell onto the right side.  She denies head injury denies loss consciousness denies chest pain abdominal pain.  She feels like the pain is gotten progressively worse.  Worse to the back of the leg.  Has a history of broken bone to her leg and so was worried about that.   Fall  Weakness      Home Medications Prior to Admission medications   Medication Sig Start Date End Date Taking? Authorizing Provider  diclofenac Sodium (VOLTAREN) 1 % GEL Apply 4 g topically 4 (four) times daily. 03/11/22  Yes Deno Etienne, DO  acetaminophen (TYLENOL) 500 MG tablet Take 1,000 mg by mouth in the morning, at noon, and at bedtime.    [provider]  amiodarone (PACERONE) 200 MG tablet Take 1 tablet (200 mg total) by mouth daily. 07/13/21   Larey Dresser, MD  diclofenac Sodium (VOLTAREN) 1 % GEL Apply 4 g topically 2 (two) times daily. (0800 & 2000) Apply to right knee    [provider]  diltiazem (CARDIZEM CD) 240 MG 24 hr capsule Take 1 capsule (240 mg total) by mouth daily. 08/28/21   Domenic Polite, MD  ELIQUIS 5 MG TABS tablet TAKE 1 TABLET BY MOUTH TWICE (2) DAILY 10/06/21   Larey Dresser, MD  exemestane (AROMASIN) 25 MG tablet TAKE 1 TABLET BY MOUTH ONCE DAILY AFTER BREAKFAST 03/03/21   [name], MD  ferrous gluconate (FERGON) 324 MG tablet Take 324 mg by mouth daily with lunch.    [provider]  furosemide (LASIX) 20 MG tablet TAKE 1 TAB BY MOUTH EVERY OTHER DAY . TAKE WITH POTASSIUM 20MEQ Patient not  taking: Reported on 02/03/2022 01/26/22   Larey Dresser, MD  furosemide (LASIX) 40 MG tablet TAKE 1 TABLET BY MOUTH AS NEEDED FOR FLUID OR EDEMA. . 01/07/22   Larey Dresser, MD  HYDROcodone-acetaminophen (NORCO/VICODIN) 5-325 MG tablet Take 1 tablet by mouth every 4 (four) hours as needed for moderate pain. Patient not taking: Reported on 02/03/2022    [provider]  [facility]) OINT Apply topically as needed.    [provider]  levothyroxine (SYNTHROID) 137 MCG tablet Take 137 mcg by mouth daily before breakfast.    [provider]  methocarbamol (ROBAXIN) 500 MG tablet Take 1 tablet (500 mg total) by mouth every 8 (eight) hours as needed for muscle spasms. 08/24/21   Irving Copas, PA-C  metoprolol succinate (TOPROL-XL) 50 MG 24 hr tablet Take 50 mg by mouth daily. Take with or immediately following a meal.    [provider]  Multiple Vitamin (MULTIVITAMIN WITH MINERALS) TABS tablet Take 1 tablet by mouth daily.    [provider]  nitroGLYCERIN (NITROSTAT) 0.4 MG SL tablet Place 1 tablet (0.4 mg total) under the tongue every 5 (five) minutes as needed for chest pain. 07/24/20 11/05/22  Conrad McNary, NP  polyethylene glycol (MIRALAX /  GLYCOLAX) 17 g packet Take 17 g by mouth daily. Patient taking differently: Take 17 g by mouth 2 (two) times daily. 08/28/21   Domenic Polite, MD  potassium chloride (KLOR-CON) 10 MEQ tablet Take 1 tablet (10 mEq total) by mouth daily. 11/05/21 02/03/22  Larey Dresser, MD  potassium chloride SA (KLOR-CON M) 20 MEQ tablet TAKE 1/2 TO 1 TABLET (10-20 MEQ TOTAL) BY MOUTH AS NEEDED. ONLY TAKE 1 FULL TABLET (20 MEQ) IF YOU TAKE LASIX. 01/07/22   Larey Dresser, MD  senna-docusate (SENOKOT-S) 8.6-50 MG tablet Take 1 tablet by mouth in the morning and at bedtime. Patient not taking: Reported on 02/03/2022    [provider]  traMADol (ULTRAM) 50 MG tablet Take 1 tablet (50 mg total) by mouth See  admin instructions. Take 1 tablet (50 mg) by mouth (scheduled) twice daily (0800 & 2000). May take an additional dose (50 mg) by mouth once daily if needed for pain. Patient not taking: Reported on 02/03/2022 11/28/21   Redwine, Madison A, PA-C  vitamin B-12 (CYANOCOBALAMIN) 1000 MCG tablet Take 1,000 mcg by mouth in the morning.    [provider]  ferrous sulfate 325 (65 FE) MG tablet Take 325 mg by mouth 2 (two) times daily.    01/16/19  [provider]  pravastatin (PRAVACHOL) 80 MG tablet Take 1 tablet (80 mg total) by mouth every evening. 11/20/15 01/16/19  Larey Dresser, MD      Allergies    Penicillins, Propoxyphene n-acetaminophen, Levaquin [levofloxacin], Repatha [evolocumab], Statins, Cortisone, Cymbalta [duloxetine hcl], Myrbetriq [mirabegron], and Pseudoephedrine    Review of Systems   Review of Systems  Neurological:  Positive for weakness.    Physical Exam Updated Vital Signs BP 129/82   Pulse 79   Temp 97.9 F (36.6 C) (Oral)   Resp 17   LMP 10/19/1991   SpO2 93%  Physical Exam Vitals and nursing note reviewed.  Constitutional:      General: She is not in acute distress.    Appearance: She is well-developed. She is not diaphoretic.  HENT:     Head: Normocephalic and atraumatic.  Eyes:     Pupils: Pupils are equal, round, and reactive to light.  Cardiovascular:     Rate and Rhythm: Normal rate and regular rhythm.     Heart sounds: No murmur heard.    No friction rub. No gallop.  Pulmonary:     Effort: Pulmonary effort is normal.     Breath sounds: No wheezing or rales.  Abdominal:     General: There is no distension.     Palpations: Abdomen is soft.     Tenderness: There is no abdominal tenderness.  Musculoskeletal:        General: No tenderness.     Cervical back: Normal range of motion and neck supple.     Comments: Pulse and motor intact bilateral lower extremities.  Patient denies any sensation to the legs to light touch.  Negative  straight leg raise test.  No clonus.  Reflexes 2+ and equal.  No obvious midline spinal tenderness step-offs or deformities.  Skin:    General: Skin is warm and dry.  Neurological:     Mental Status: She is alert and oriented to person, place, and time.  Psychiatric:        Behavior: Behavior normal.     ED Results / Procedures / Treatments   Labs (all labs ordered are listed, but only abnormal results are displayed) Labs  Reviewed - No data to display  EKG None  Radiology CT Lumbar Spine Wo Contrast  Result Date: 03/11/2022 CLINICAL DATA:  Fall EXAM: CT LUMBAR SPINE WITHOUT CONTRAST TECHNIQUE: Multidetector CT imaging of the lumbar spine was performed without intravenous contrast administration. Multiplanar CT image reconstructions were also generated. RADIATION DOSE REDUCTION: This exam was performed according to the departmental dose-optimization program which includes automated exposure control, adjustment of the mA and/or kV according to patient size and/or use of iterative reconstruction technique. COMPARISON:  02/03/2018 CT lumbar spine, correlation is also made with 09/21/2019 MRI lumbar spine FINDINGS: Segmentation: 5 lumbar type vertebrae. Alignment: Trace anterolisthesis L4 on L5, unchanged. Mild dextrocurvature. Mild straightening of the normal lumbar lordosis. Vertebrae: No acute fracture or suspicious osseous lesion. Paraspinal and other soft tissues: No acute finding. Aortic atherosclerosis. Low-density lesions in the kidneys, possibly renal cysts. Disc levels: Multilevel degenerative changes, which appear grossly unchanged compared to the 09/21/2019 MRI, with disc height loss most prominently at L3-L4, L4-L5, and L5-S1. The previously noted L4-L5 disc extrusion with cranial extension is not well evaluated on this study. IMPRESSION: No acute fracture or traumatic listhesis in the lumbar spine. Electronically Signed   By: Merilyn Baba M.D.   On: 03/11/2022 11:38   DG Hip Unilat W  or Wo Pelvis 2-3 Views Right  Result Date: 03/11/2022 CLINICAL DATA:  [place] on Monday EXAM: DG HIP (WITH OR WITHOUT PELVIS) 2-3V RIGHT COMPARISON:  11/28/2021 FINDINGS: IM nail with compression screw at proximal RIGHT femur post ORIF of intertrochanteric fracture. Osseous demineralization. Penetration of compression screw through femoral head into acetabulum new since prior exam. Heterotopic bone adjacent to the medial aspect of the proximal RIGHT femoral diaphysis. Hardware appears intact. Mild narrowing of hip joints bilaterally. No new fracture, dislocation, or bone destruction. IMPRESSION: Prior ORIF of intertrochanteric fracture RIGHT femur with previously seen compression screw now penetrating through the RIGHT femoral neck/head into the acetabulum. Osseous demineralization with degenerative changes of both hip joints. Electronically Signed   By: Lavonia Dana M.D.   On: 03/11/2022 09:41    Procedures Procedures    Medications Ordered in ED Medications  acetaminophen (TYLENOL) tablet 1,000 mg (1,000 mg Oral Given 03/11/22 0935)  oxyCODONE (Oxy IR/ROXICODONE) immediate release tablet 5 mg (5 mg Oral Given 03/11/22 0935)  ketorolac (TORADOL) 15 MG/ML injection 15 mg (15 mg Intramuscular Given 03/11/22 1245)    ED Course/ Medical Decision Making/ A&P                           Medical Decision Making Amount and/or Complexity of Data Reviewed Labs: ordered. Radiology: ordered. ECG/medicine tests: ordered.  Risk OTC drugs. Prescription drug management. Decision regarding hospitalization.   87 yo F with a chief complaints of low back pain that radiates down the right leg.  Patient had a traumatic event prior to this.  We will obtain a plain film of the hip and CT scan of the L-spine.  Treat pain here.  Reassess.  Plain film of the hip independently interpreted by me without fracture or dislocation.  Hardware appears to be intact.  CT scan of the L-spine also negative for acute fracture.  Will  discharge home.  PCP follow-up.  When I discussed the results with the patient the family member had showed up and told me that they could not take her back home, tell me that she has not been able to get up and move around due to  pain for the past week.  Since the patient has had some pain medicine and will attempt to ambulate here.  Transition of care consult.  I received a phone call from Dr. Aurea Graff office, they had reviewed the patient's x-rays and were concerned about hardware migration through the acetabulum and thought that the patient would require hip replacement.  They recommended that I discussed with medicine and they would see in the morning.  We will obtain blood work.  Chest x-ray EKG.  Signed out to Dr. Vanita Panda, plan for admission post labs.  The patients results and plan were reviewed and discussed.   Any x-rays performed were independently reviewed by myself.   Differential diagnosis were considered with the presenting HPI.  Medications  morphine (PF) 2 MG/ML injection 0.5 mg (has no administration in time range)  heparin ADULT infusion 100 units/mL (25000 units/235m) (950 Units/hr Intravenous New Bag/Given 03/11/22 2034)  acetaminophen (TYLENOL) tablet 1,000 mg (1,000 mg Oral Given 03/11/22 0935)  oxyCODONE (Oxy IR/ROXICODONE) immediate release tablet 5 mg (5 mg Oral Given 03/11/22 0935)  ketorolac (TORADOL) 15 MG/ML injection 15 mg (15 mg Intramuscular Given 03/11/22 0936)  oxyCODONE (Oxy IR/ROXICODONE) immediate release tablet 5 mg (5 mg Oral Given 03/11/22 1216)  metoprolol succinate (TOPROL-XL) 24 hr tablet 50 mg (50 mg Oral Given 03/11/22 2112)    Vitals:   03/11/22 1912 03/11/22 1939 03/11/22 2346 03/12/22 0422  BP: (!) 154/91 (!) 134/93 121/83 128/82  Pulse: 70 77 73 72  Resp:  '18 16 16  '$ Temp: 98 F (36.7 C) 98.5 F (36.9 C) 98.1 F (36.7 C) 98.2 F (36.8 C)  TempSrc: Oral     SpO2: 95% 96% 97% 98%  Weight: 64.7 kg     Height: '5\' 8"'$  (1.727 m)       Final  diagnoses:  Acute right-sided low back pain with right-sided sciatica    Admission/ observation were discussed with the admitting physician, patient and/or family and they are comfortable with the plan.    Medications given during this visit Medications  acetaminophen (TYLENOL) tablet 1,000 mg (1,000 mg Oral Given 03/11/22 0935)  oxyCODONE (Oxy IR/ROXICODONE) immediate release tablet 5 mg (5 mg Oral Given 03/11/22 0935)  ketorolac (TORADOL) 15 MG/ML injection 15 mg (15 mg Intramuscular Given 03/11/22 0936)          Final Clinical Impression(s) / ED Diagnoses Final diagnoses:  Acute right-sided low back pain with right-sided sciatica    Rx / DC Orders ED Discharge Orders          Ordered    diclofenac Sodium (VOLTAREN) 1 % GEL  4 times daily        03/11/22 1[place] DLakeline DO 03/12/22 [id number]

## 2022-03-11 NOTE — ED Provider Notes (Incomplete Revision)
[place] DEPT Provider Note   CSN: [id number] Arrival date & time: 03/11/22  2876     History  Chief Complaint  Patient presents with   Fall   Weakness    Kim [name] is a 87 y.o. female.  87 yo F with a chief complaints of low back pain that radiates down the right leg.  This has been going on for about a week.  Started with a fall.  The patient was walking to the bathroom with her walker and lost her balance and fell onto the right side.  She denies head injury denies loss consciousness denies chest pain abdominal pain.  She feels like the pain is gotten progressively worse.  Worse to the back of the leg.  Has a history of broken bone to her leg and so was worried about that.   Fall  Weakness      Home Medications Prior to Admission medications   Medication Sig Start Date End Date Taking? Authorizing Provider  diclofenac Sodium (VOLTAREN) 1 % GEL Apply 4 g topically 4 (four) times daily. 03/11/22  Yes Deno Etienne, DO  acetaminophen (TYLENOL) 500 MG tablet Take 1,000 mg by mouth in the morning, at noon, and at bedtime.    [provider]  amiodarone (PACERONE) 200 MG tablet Take 1 tablet (200 mg total) by mouth daily. 07/13/21   Larey Dresser, MD  diclofenac Sodium (VOLTAREN) 1 % GEL Apply 4 g topically 2 (two) times daily. (0800 & 2000) Apply to right knee    [provider]  diltiazem (CARDIZEM CD) 240 MG 24 hr capsule Take 1 capsule (240 mg total) by mouth daily. 08/28/21   Domenic Polite, MD  ELIQUIS 5 MG TABS tablet TAKE 1 TABLET BY MOUTH TWICE (2) DAILY 10/06/21   Larey Dresser, MD  exemestane (AROMASIN) 25 MG tablet TAKE 1 TABLET BY MOUTH ONCE DAILY AFTER BREAKFAST 03/03/21   [name], MD  ferrous gluconate (FERGON) 324 MG tablet Take 324 mg by mouth daily with lunch.    [provider]  furosemide (LASIX) 20 MG tablet TAKE 1 TAB BY MOUTH EVERY OTHER DAY . TAKE WITH POTASSIUM 20MEQ Patient not  taking: Reported on 02/03/2022 01/26/22   Larey Dresser, MD  furosemide (LASIX) 40 MG tablet TAKE 1 TABLET BY MOUTH AS NEEDED FOR FLUID OR EDEMA. . 01/07/22   Larey Dresser, MD  HYDROcodone-acetaminophen (NORCO/VICODIN) 5-325 MG tablet Take 1 tablet by mouth every 4 (four) hours as needed for moderate pain. Patient not taking: Reported on 02/03/2022    [provider]  [facility]) OINT Apply topically as needed.    [provider]  levothyroxine (SYNTHROID) 137 MCG tablet Take 137 mcg by mouth daily before breakfast.    [provider]  methocarbamol (ROBAXIN) 500 MG tablet Take 1 tablet (500 mg total) by mouth every 8 (eight) hours as needed for muscle spasms. 08/24/21   Irving Copas, PA-C  metoprolol succinate (TOPROL-XL) 50 MG 24 hr tablet Take 50 mg by mouth daily. Take with or immediately following a meal.    [provider]  Multiple Vitamin (MULTIVITAMIN WITH MINERALS) TABS tablet Take 1 tablet by mouth daily.    [provider]  nitroGLYCERIN (NITROSTAT) 0.4 MG SL tablet Place 1 tablet (0.4 mg total) under the tongue every 5 (five) minutes as needed for chest pain. 07/24/20 11/05/22  Conrad [place], NP  polyethylene glycol (MIRALAX /  GLYCOLAX) 17 g packet Take 17 g by mouth daily. Patient taking differently: Take 17 g by mouth 2 (two) times daily. 08/28/21   Domenic Polite, MD  potassium chloride (KLOR-CON) 10 MEQ tablet Take 1 tablet (10 mEq total) by mouth daily. 11/05/21 02/03/22  Larey Dresser, MD  potassium chloride SA (KLOR-CON M) 20 MEQ tablet TAKE 1/2 TO 1 TABLET (10-20 MEQ TOTAL) BY MOUTH AS NEEDED. ONLY TAKE 1 FULL TABLET (20 MEQ) IF YOU TAKE LASIX. 01/07/22   Larey Dresser, MD  senna-docusate (SENOKOT-S) 8.6-50 MG tablet Take 1 tablet by mouth in the morning and at bedtime. Patient not taking: Reported on 02/03/2022    [provider]  traMADol (ULTRAM) 50 MG tablet Take 1 tablet (50 mg total) by mouth See  admin instructions. Take 1 tablet (50 mg) by mouth (scheduled) twice daily (0800 & 2000). May take an additional dose (50 mg) by mouth once daily if needed for pain. Patient not taking: Reported on 02/03/2022 11/28/21   Redwine, Madison A, PA-C  vitamin B-12 (CYANOCOBALAMIN) 1000 MCG tablet Take 1,000 mcg by mouth in the morning.    [provider]  ferrous sulfate 325 (65 FE) MG tablet Take 325 mg by mouth 2 (two) times daily.    01/16/19  [provider]  pravastatin (PRAVACHOL) 80 MG tablet Take 1 tablet (80 mg total) by mouth every evening. 11/20/15 01/16/19  Larey Dresser, MD      Allergies    Penicillins, Propoxyphene n-acetaminophen, Levaquin [levofloxacin], Repatha [evolocumab], Statins, Cortisone, Cymbalta [duloxetine hcl], Myrbetriq [mirabegron], and Pseudoephedrine    Review of Systems   Review of Systems  Neurological:  Positive for weakness.    Physical Exam Updated Vital Signs BP 129/82   Pulse 79   Temp 97.9 F (36.6 C) (Oral)   Resp 17   LMP 10/19/1991   SpO2 93%  Physical Exam Vitals and nursing note reviewed.  Constitutional:      General: She is not in acute distress.    Appearance: She is well-developed. She is not diaphoretic.  HENT:     Head: Normocephalic and atraumatic.  Eyes:     Pupils: Pupils are equal, round, and reactive to light.  Cardiovascular:     Rate and Rhythm: Normal rate and regular rhythm.     Heart sounds: No murmur heard.    No friction rub. No gallop.  Pulmonary:     Effort: Pulmonary effort is normal.     Breath sounds: No wheezing or rales.  Abdominal:     General: There is no distension.     Palpations: Abdomen is soft.     Tenderness: There is no abdominal tenderness.  Musculoskeletal:        General: No tenderness.     Cervical back: Normal range of motion and neck supple.     Comments: Pulse and motor intact bilateral lower extremities.  Patient denies any sensation to the legs to light touch.  Negative  straight leg raise test.  No clonus.  Reflexes 2+ and equal.  No obvious midline spinal tenderness step-offs or deformities.  Skin:    General: Skin is warm and dry.  Neurological:     Mental Status: She is alert and oriented to person, place, and time.  Psychiatric:        Behavior: Behavior normal.     ED Results / Procedures / Treatments   Labs (all labs ordered are listed, but only abnormal results are displayed) Labs  Reviewed - No data to display  EKG None  Radiology CT Lumbar Spine Wo Contrast  Result Date: 03/11/2022 CLINICAL DATA:  Fall EXAM: CT LUMBAR SPINE WITHOUT CONTRAST TECHNIQUE: Multidetector CT imaging of the lumbar spine was performed without intravenous contrast administration. Multiplanar CT image reconstructions were also generated. RADIATION DOSE REDUCTION: This exam was performed according to the departmental dose-optimization program which includes automated exposure control, adjustment of the mA and/or kV according to patient size and/or use of iterative reconstruction technique. COMPARISON:  02/03/2018 CT lumbar spine, correlation is also made with 09/21/2019 MRI lumbar spine FINDINGS: Segmentation: 5 lumbar type vertebrae. Alignment: Trace anterolisthesis L4 on L5, unchanged. Mild dextrocurvature. Mild straightening of the normal lumbar lordosis. Vertebrae: No acute fracture or suspicious osseous lesion. Paraspinal and other soft tissues: No acute finding. Aortic atherosclerosis. Low-density lesions in the kidneys, possibly renal cysts. Disc levels: Multilevel degenerative changes, which appear grossly unchanged compared to the 09/21/2019 MRI, with disc height loss most prominently at L3-L4, L4-L5, and L5-S1. The previously noted L4-L5 disc extrusion with cranial extension is not well evaluated on this study. IMPRESSION: No acute fracture or traumatic listhesis in the lumbar spine. Electronically Signed   By: Merilyn Baba M.D.   On: 03/11/2022 11:38   DG Hip Unilat W  or Wo Pelvis 2-3 Views Right  Result Date: 03/11/2022 CLINICAL DATA:  [place] on Monday EXAM: DG HIP (WITH OR WITHOUT PELVIS) 2-3V RIGHT COMPARISON:  11/28/2021 FINDINGS: IM nail with compression screw at proximal RIGHT femur post ORIF of intertrochanteric fracture. Osseous demineralization. Penetration of compression screw through femoral head into acetabulum new since prior exam. Heterotopic bone adjacent to the medial aspect of the proximal RIGHT femoral diaphysis. Hardware appears intact. Mild narrowing of hip joints bilaterally. No new fracture, dislocation, or bone destruction. IMPRESSION: Prior ORIF of intertrochanteric fracture RIGHT femur with previously seen compression screw now penetrating through the RIGHT femoral neck/head into the acetabulum. Osseous demineralization with degenerative changes of both hip joints. Electronically Signed   By: Lavonia Dana M.D.   On: 03/11/2022 09:41    Procedures Procedures    Medications Ordered in ED Medications  acetaminophen (TYLENOL) tablet 1,000 mg (1,000 mg Oral Given 03/11/22 0935)  oxyCODONE (Oxy IR/ROXICODONE) immediate release tablet 5 mg (5 mg Oral Given 03/11/22 0935)  ketorolac (TORADOL) 15 MG/ML injection 15 mg (15 mg Intramuscular Given 03/11/22 4097)    ED Course/ Medical Decision Making/ A&P                           Medical Decision Making Amount and/or Complexity of Data Reviewed Labs: ordered. Radiology: ordered. ECG/medicine tests: ordered.  Risk OTC drugs. Prescription drug management.   87 yo F with a chief complaints of low back pain that radiates down the right leg.  Patient had a traumatic event prior to this.  We will obtain a plain film of the hip and CT scan of the L-spine.  Treat pain here.  Reassess.  Plain film of the hip independently interpreted by me without fracture or dislocation.  Hardware appears to be intact.  CT scan of the L-spine also negative for acute fracture.  Will discharge home.  PCP follow-up.  When  I discussed the results with the patient the family member had showed up and told me that they could not take her back home, tell me that she has not been able to get up and move around due to pain for the  past week.  Since the patient has had some pain medicine and will attempt to ambulate here.  Transition of care consult.  I received a phone call from Dr. Aurea Graff office, they had reviewed the patient's x-rays and were concerned about hardware migration through the acetabulum and thought that the patient would require hip replacement.  They recommended that I discussed with medicine and they would see in the morning.  We will obtain blood work.  Chest x-ray EKG.   Medications given during this visit Medications  acetaminophen (TYLENOL) tablet 1,000 mg (1,000 mg Oral Given 03/11/22 0935)  oxyCODONE (Oxy IR/ROXICODONE) immediate release tablet 5 mg (5 mg Oral Given 03/11/22 0935)  ketorolac (TORADOL) 15 MG/ML injection 15 mg (15 mg Intramuscular Given 03/11/22 0936)     The patient appears reasonably screen and/or stabilized for discharge and I doubt any other medical condition or other [facility] requiring further screening, evaluation, or treatment in the ED at this time prior to discharge.          Final Clinical Impression(s) / ED Diagnoses Final diagnoses:  Acute right-sided low back pain with right-sided sciatica    Rx / DC Orders ED Discharge Orders          Ordered    diclofenac Sodium (VOLTAREN) 1 % GEL  4 times daily        03/11/22 [place], Danae [place], DO 03/11/22 1145

## 2022-03-11 NOTE — ED Notes (Signed)
An attempt to ambulate the patient was made. They patient refused saying her knees were too weak. Family also stated they wanted [place] to wait until the patient was seen by PT.

## 2022-03-11 NOTE — H&P (Signed)
History and Physical    Patient: Kim [name] VHQ:[id number] DOB: [birth year] DOA: 03/11/2022 DOS: the patient was seen and examined on 03/11/2022 PCP: Sueanne Margarita, DO  Patient coming from: Home  Chief Complaint:  Chief Complaint  Patient presents with   Fall   Weakness   HPI: Kim [name] is a 87 y.o. female with medical history significant of permanent a fib, CAD, HOCM, HFpEF, hypothyroidism. Presenting with right hip pain. 3 nights ago, the patient was returning to bed after going to the bathroom. She steadied herself beside her bed with her walker. She she tried to back in, she slid to the floor and hit her hip. She didn't have any CP, palpitations, dizziness, lightheadedness, or head injury. She did not pass out. Initially she was ok, but the pain worsened over the last couple of days. She was finding it more difficult to get around this morning. So she decided to come to the ED for help. She denies any other aggravating or alleviating factors.    Review of Systems: As mentioned in the history of present illness. All other systems reviewed and are negative. Past Medical History:  Diagnosis Date   Allergic rhinitis    Allergy    Arthritis    hands   Breast cancer ([place])    Breast cancer of upper-outer quadrant of left female breast ([place]) 11/08/2013   finished chemo and radiation 2015   CAD (coronary artery disease)    a. 12/2013 Cath/PCI: LM nl, LAD 20p, 76m 30d, D1 30p, LCX 20p, 361mOM1 20, Mo2 40p, RCA 30p, 9939m.0x16 Promus Premier DES), 20d, RPL 30, RPDA 70p, 31m67m 65-70%.   CHF (congestive heart failure) (HCC)    Diverticulosis    HOH (hard of hearing)    bilateral, wears hearing aids   Hx of adenomatous colonic polyps 02/1999   Hyperlipidemia    Hypertension    Hypertrophic obstructive cardiomyopathy (HOCM) (HCC)[place] Cardiologist is Dr. DaltLoralie Champagneypothyroidism    Iron deficiency anemia due to chronic blood loss 01/07/2016   Low back pain    gets ESI  from Dr. Max Rennis Hardingalabsorption of iron 01/07/2016   Myocardial infarction (HCC)[place]/2015   very mild-with first chemo -placed stent x1   Persistent atrial fibrillation with rapid ventricular response (HCC)[place]/08/2019   Personal history of radiation therapy 2015   left breast, scv, axilla  05/14/2014-06/26/2014  Dr SaraEppie Gibson/P radiation therapy 05/14/2014-06/26/2014   1) Left Breast  / 50 Gy in 25 fractions, 2) Left Supraclavicular fossa/ 47.5 Gy in 25 fractions, 3) Left Posterior Axillary boost / 4.825 Gy in 25 fractions, 4) Left Breast boost / 10 Gy in 5 fractions    Wears hearing aid    left and right    Past Surgical History:  Procedure Laterality Date   ABDOMINAL HYSTERECTOMY     ANTERIOR AND POSTERIOR REPAIR N/A 01/17/2015   Procedure: CYSTOCELE REPAIR ;  Surgeon: MariPrincess Bruins;  [facility];  Service: Gynecology;  Laterality: N/A;   BILATERAL SALPINGOOPHORECTOMY     see BethLaurin Coderfor GYN exams   BREAST BIOPSY Left 10/18/2013   BREAST BIOPSY Left 10/31/2013   BREAST LUMPECTOMY Left 11/05/2013   BREAST LUMPECTOMY WITH NEEDLE LOCALIZATION AND AXILLARY LYMPH NODE DISSECTION Left 11/05/2013   Procedure: LEFT BREAST LUMPECTOMY WITH NEEDLE LOCALIZATION and axillary lymph Node Dissection;  Surgeon: BenjEdward Jolly;  Location: [facility];  Service: General;  Laterality: Left;   BREAST SURGERY  11/2013   left lumpectomy   CARDIOVERSION N/A 10/17/2018   Procedure: CARDIOVERSION;  Surgeon: Larey Dresser, MD;  Location: [facility] ENDOSCOPY;  Service: Cardiovascular;  Laterality: N/A;   CARDIOVERSION N/A 11/08/2018   Procedure: CARDIOVERSION;  Surgeon: Larey Dresser, MD;  Location: [facility] ENDOSCOPY;  Service: Cardiovascular;  Laterality: N/A;   CARDIOVERSION N/A 08/13/2020   Procedure: CARDIOVERSION;  Surgeon: Larey Dresser, MD;  Location: [facility] ENDOSCOPY;  Service: Cardiovascular;  Laterality: N/A;   CARDIOVERSION N/A 03/13/2021   Procedure: CARDIOVERSION;   Surgeon: Larey Dresser, MD;  Location: [facility] ENDOSCOPY;  Service: Cardiovascular;  Laterality: N/A;   CARDIOVERSION N/A 11/26/2021   Procedure: CARDIOVERSION;  Surgeon: Larey Dresser, MD;  Location: [facility] ENDOSCOPY;  Service: Cardiovascular;  Laterality: N/A;   CATARACT EXTRACTION [place], BILATERAL  2012   bilateral   COLONOSCOPY  11/04/2015   per Dr. Fuller Plan, adenomatous polyps, no repeats planned    EYE SURGERY  11/22/2006   cataracts, bilateral, intraocular lens implant   INTRAMEDULLARY (IM) NAIL INTERTROCHANTERIC Right 08/23/2021   Procedure: INTRAMEDULLARY (IM) NAIL INTERTROCHANTRIC;  Surgeon: Paralee Cancel, MD;  Location: [place];  Service: Orthopedics;  Laterality: Right;   JOINT REPLACEMENT     2019   LEFT HEART CATHETERIZATION WITH CORONARY ANGIOGRAM N/A 12/19/2013   Procedure: LEFT HEART CATHETERIZATION WITH CORONARY ANGIOGRAM;  Surgeon: Burnell Blanks, MD;  Location: [facility] CATH LAB;  Service: Cardiovascular;  Laterality: N/A;   lumbar epidural steroid injection     lumber spinal stenosis   LUMBAR LAMINECTOMY/DECOMPRESSION MICRODISCECTOMY Right 03/01/2018   Procedure: RIGHT LUMBAR FOUR- LUMBAR FIVE LAMINECTOMY/MICRODISCECTOMY;  Surgeon: [name], MD;  Location: [place];  Service: Neurosurgery;  Laterality: Right;   MITRAL VALVE REPAIR N/A 12/06/2018   Procedure: MITRAL VALVE REPAIR;  Surgeon: Sherren Mocha, MD;  Location: [place] CV LAB;  Service: Cardiovascular;  Laterality: N/A;   POLYPECTOMY     PORTACATH PLACEMENT Right 12/11/2013   Procedure: INSERTION PORT-A-CATH;  Surgeon: Edward Jolly, MD;  Location: [place];  Service: General;  Laterality: Right;  Subclavian Vein;    RIGHT/LEFT HEART CATH AND CORONARY ANGIOGRAPHY N/A 11/03/2018   Procedure: RIGHT/LEFT HEART CATH AND CORONARY ANGIOGRAPHY;  Surgeon: Larey Dresser, MD;  Location: [place] CV LAB;  Service: Cardiovascular;  Laterality: N/A;   TEE WITHOUT CARDIOVERSION  N/A 10/17/2018   Procedure: TRANSESOPHAGEAL ECHOCARDIOGRAM (TEE);  Surgeon: Larey Dresser, MD;  Location: [facility] END[facility]COPY;  Service: Cardiovascular;  Laterality: N/A;   TEE WITHOUT CARDIOVERSION N/A 11/08/2018   Procedure: TRANSESOPHAGEAL ECHOCARDIOGRAM (TEE);  Surgeon: Larey Dresser, MD;  Location: [facility] ENDOSCOPY;  Service: Cardiovascular;  Laterality: N/A;   TOTAL KNEE ARTHROPLASTY Right 10/24/2017   Procedure: TOTAL RIGHT KNEE ARTHROPLASTY;  Surgeon: Gaynelle Arabian, MD;  Location: [facility];  Service: Orthopedics;  Laterality: Right;   Social History:  reports that she has never smoked. She has never used smokeless tobacco. She reports that she does not drink alcohol and does not use drugs.  Allergies  Allergen Reactions   Penicillins Anaphylaxis and Swelling    FACIAL SWELLING PATIENT HAS HAD A PCN REACTION WITH IMMEDIATE RASH, FACIAL/TONGUE/THROAT SWELLING, SOB, OR LIGHTHEADEDNESS WITH HYPOTENSION:  #  #  YES  #  #  Has patient had a PCN reaction causing severe rash involving mucus membranes or skin necrosis: No Has patient had a PCN reaction that required hospitalization: No Has patient  had a PCN reaction occurring within the last 10 years: No   Propoxyphene N-Acetaminophen Swelling    tongue swelling   Levaquin [Levofloxacin] Hives   Repatha [Evolocumab] Other (See Comments)    Myalgias, fatigue, some itching and burning on her feet,  Arthralgia (Joint Pain)   Statins Other (See Comments)    Joint pain   Cortisone Rash   Cymbalta [Duloxetine Hcl] Itching and Rash   Myrbetriq [Mirabegron] Rash    Rash on face   Pseudoephedrine Other (See Comments)    Facial flushing    Family History  Problem Relation Age of Onset   Alcohol abuse Father    Kidney failure Mother    Colon cancer Neg Hx    Rectal cancer Neg Hx    Stomach cancer Neg Hx    Colon polyps Neg Hx     Prior to Admission medications   Medication Sig Start Date End Date Taking? Authorizing Provider  diclofenac Sodium  (VOLTAREN) 1 % GEL Apply 4 g topically 4 (four) times daily. 03/11/22  Yes Deno Etienne, DO  methylPREDNISolone (MEDROL DOSEPAK) 4 MG TBPK tablet Day 1: '8mg'$  before breakfast, 4 mg after lunch, 4 mg after supper, and 8 mg at bedtime Day 2: 4 mg before breakfast, 4 mg after lunch, 4 mg  after supper, and 8 mg  at bedtime Day 3:  4 mg  before breakfast, 4 mg  after lunch, 4 mg after supper, and 4 mg  at bedtime Day 4: 4 mg  before breakfast, 4 mg  after lunch, and 4 mg at bedtime Day 5: 4 mg  before breakfast and 4 mg at bedtime Day 6: 4 mg  before breakfast 03/11/22  Yes Deno Etienne, DO  acetaminophen (TYLENOL) 500 MG tablet Take 1,000 mg by mouth in the morning, at noon, and at bedtime.    [provider]  amiodarone (PACERONE) 200 MG tablet Take 1 tablet (200 mg total) by mouth daily. 07/13/21   Larey Dresser, MD  diclofenac Sodium (VOLTAREN) 1 % GEL Apply 4 g topically 2 (two) times daily. (0800 & 2000) Apply to right knee    [provider]  diltiazem (CARDIZEM CD) 240 MG 24 hr capsule Take 1 capsule (240 mg total) by mouth daily. 08/28/21   Domenic Polite, MD  ELIQUIS 5 MG TABS tablet TAKE 1 TABLET BY MOUTH TWICE (2) DAILY 10/06/21   Larey Dresser, MD  exemestane (AROMASIN) 25 MG tablet TAKE 1 TABLET BY MOUTH ONCE DAILY AFTER BREAKFAST 03/03/21   [name], MD  ferrous gluconate (FERGON) 324 MG tablet Take 324 mg by mouth daily with lunch.    [provider]  furosemide (LASIX) 20 MG tablet TAKE 1 TAB BY MOUTH EVERY OTHER DAY . TAKE WITH POTASSIUM 20MEQ Patient not taking: Reported on 02/03/2022 01/26/22   Larey Dresser, MD  furosemide (LASIX) 40 MG tablet TAKE 1 TABLET BY MOUTH AS NEEDED FOR FLUID OR EDEMA. . 01/07/22   Larey Dresser, MD  HYDROcodone-acetaminophen (NORCO/VICODIN) 5-325 MG tablet Take 1 tablet by mouth every 4 (four) hours as needed for moderate pain. Patient not taking: Reported on 02/03/2022    [provider]  [place]  [facility]) OINT Apply topically as needed.    [provider]  levothyroxine (SYNTHROID) 137 MCG tablet Take 137 mcg by mouth daily before breakfast.    [provider]  methocarbamol (ROBAXIN) 500 MG tablet Take 1 tablet (500 mg total) by mouth  every 8 (eight) hours as needed for muscle spasms. 08/24/21   Irving Copas, PA-C  metoprolol succinate (TOPROL-XL) 50 MG 24 hr tablet Take 50 mg by mouth daily. Take with or immediately following a meal.    [provider]  Multiple Vitamin (MULTIVITAMIN WITH MINERALS) TABS tablet Take 1 tablet by mouth daily.    [provider]  nitroGLYCERIN (NITROSTAT) 0.4 MG SL tablet Place 1 tablet (0.4 mg total) under the tongue every 5 (five) minutes as needed for chest pain. 07/24/20 11/05/22  Clegg, Amy D, NP  polyethylene glycol (MIRALAX / GLYCOLAX) 17 g packet Take 17 g by mouth daily. Patient taking differently: Take 17 g by mouth 2 (two) times daily. 08/28/21   Domenic Polite, MD  potassium chloride (KLOR-CON) 10 MEQ tablet Take 1 tablet (10 mEq total) by mouth daily. 11/05/21 02/03/22  Larey Dresser, MD  potassium chloride SA (KLOR-CON M) 20 MEQ tablet TAKE 1/2 TO 1 TABLET (10-20 MEQ TOTAL) BY MOUTH AS NEEDED. ONLY TAKE 1 FULL TABLET (20 MEQ) IF YOU TAKE LASIX. 01/07/22   Larey Dresser, MD  senna-docusate (SENOKOT-S) 8.6-50 MG tablet Take 1 tablet by mouth in the morning and at bedtime. Patient not taking: Reported on 02/03/2022    [provider]  traMADol (ULTRAM) 50 MG tablet Take 1 tablet (50 mg total) by mouth See admin instructions. Take 1 tablet (50 mg) by mouth (scheduled) twice daily (0800 & 2000). May take an additional dose (50 mg) by mouth once daily if needed for pain. Patient not taking: Reported on 02/03/2022 11/28/21   Redwine, Madison A, PA-C  vitamin B-12 (CYANOCOBALAMIN) 1000 MCG tablet Take 1,000 mcg by mouth in the morning.    [provider]  ferrous sulfate 325 (65 FE) MG tablet Take  325 mg by mouth 2 (two) times daily.    01/16/19  [provider]  pravastatin (PRAVACHOL) 80 MG tablet Take 1 tablet (80 mg total) by mouth every evening. 11/20/15 01/16/19  Larey Dresser, MD    Physical Exam: Vitals:   03/11/22 1100 03/11/22 1415 03/11/22 1500 03/11/22 1515  BP: 129/82 121/84 120/72 127/83  Pulse: 79 66 64 66  Resp: '17 15 11 12  '$ Temp:      TempSrc:      SpO2: 93% 98% 94% 95%   General: 87 y.o. female resting in bed in NAD Eyes: PERRL, normal sclera ENMT: Nares patent w/o discharge, orophaynx clear, dentition normal, ears w/o discharge/lesions/ulcers Neck: Supple, trachea midline Cardiovascular: RRR, +S1, S2, no m/g/r, equal pulses throughout Respiratory: CTABL, no w/r/r, normal WOB GI: BS+, NDNT, no masses noted, no organomegaly noted MSK: No e/c/c; limited ROM for right hip d/t pain Neuro: A&O x 3, other than HoH, no focal deficits Psyc: Appropriate interaction and affect, calm/cooperative  Data Reviewed:  Na+  138 K+  4.6 BUN  29 Scr  1.38 WBC  7.2  Hip XR Prior ORIF of intertrochanteric fracture RIGHT femur with previously seen compression screw now penetrating through the RIGHT femoral neck/head into the acetabulum. Osseous demineralization with degenerative changes of both hip joints.  CXR: Cardiomegaly. There are no signs of pulmonary edema or focal pulmonary consolidation.  Assessment and Plan: Right femur fracture     - admit to inpt, tele     - [facility] consulted, appreciate assistance     - make NPOpMN     - pain control  Permanent A fib     - hold eliquis until  eval'd by ortho     - will have heparin gtt     - continue home regimen otherwise  CAD s/p DES HOCM Chronic diastolic HF     - continue home regimen when confirmed  Triple positive adenocarcinoma of left breast     - continue f/u w/ Dr. Marin Olp  CKD 3a     - she is at baseline, monitor  Hypothyroidism     - continue home regimen when  confirmed  HTN     - continue home regimen when confirmed  Advance Care Planning:   Code Status: FULL  Consults: EDP spoke with [facility]  Family Communication: w/ daughter by phone  Severity of Illness: The appropriate patient status for this patient is INPATIENT. Inpatient status is judged to be reasonable and necessary in order to provide the required intensity of service to ensure the patient's safety. The patient's presenting symptoms, physical exam findings, and initial radiographic and laboratory data in the context of their chronic comorbidities is felt to place them at high risk for further clinical deterioration. Furthermore, it is not anticipated that the patient will be medically stable for discharge from the hospital within 2 midnights of admission.   * I certify that at the point of admission it is my clinical judgment that the patient will require inpatient hospital care spanning beyond 2 midnights from the point of admission due to high intensity of service, high risk for further deterioration and high frequency of surveillance required.*  Author: Jonnie Finner, DO 03/11/2022 4:03 PM  For on call review [email or web address].

## 2022-03-12 DIAGNOSIS — I4821 Permanent atrial fibrillation: Secondary | ICD-10-CM

## 2022-03-12 DIAGNOSIS — Z01810 Encounter for preprocedural cardiovascular examination: Secondary | ICD-10-CM | POA: Diagnosis not present

## 2022-03-12 DIAGNOSIS — I5032 Chronic diastolic (congestive) heart failure: Secondary | ICD-10-CM

## 2022-03-12 DIAGNOSIS — S72001A Fracture of unspecified part of neck of right femur, initial encounter for closed fracture: Secondary | ICD-10-CM | POA: Diagnosis not present

## 2022-03-12 DIAGNOSIS — E440 Moderate protein-calorie malnutrition: Secondary | ICD-10-CM | POA: Insufficient documentation

## 2022-03-12 DIAGNOSIS — I421 Obstructive hypertrophic cardiomyopathy: Secondary | ICD-10-CM

## 2022-03-12 DIAGNOSIS — I2510 Atherosclerotic heart disease of native coronary artery without angina pectoris: Secondary | ICD-10-CM

## 2022-03-12 LAB — CBC
HCT: 40.3 % (ref 36.0–46.0)
Hemoglobin: 12.9 g/dL (ref 12.0–15.0)
MCH: 33.4 pg (ref 26.0–34.0)
MCHC: 32 g/dL (ref 30.0–36.0)
MCV: 104.4 fL — ABNORMAL HIGH (ref 80.0–100.0)
Platelets: 136 10*3/uL — ABNORMAL LOW (ref 150–400)
RBC: 3.86 MIL/uL — ABNORMAL LOW (ref 3.87–5.11)
RDW: 12.6 % (ref 11.5–15.5)
WBC: 7 10*3/uL (ref 4.0–10.5)
nRBC: 0 % (ref 0.0–0.2)

## 2022-03-12 LAB — APTT
aPTT: 83 seconds — ABNORMAL HIGH (ref 24–36)
aPTT: 97 seconds — ABNORMAL HIGH (ref 24–36)

## 2022-03-12 LAB — HEPARIN LEVEL (UNFRACTIONATED): Heparin Unfractionated: >1.1 IU/mL — ABNORMAL HIGH (ref 0.30–0.70)

## 2022-03-12 MED ORDER — FERROUS GLUCONATE 324 (38 FE) MG PO TABS
324.0000 mg | ORAL_TABLET | Freq: Every day | ORAL | Status: DC
  Administered 2022-03-12 – 2022-03-18 (×7): 324 mg via ORAL
  Filled 2022-03-12 (×7): qty 1

## 2022-03-12 MED ORDER — FUROSEMIDE 40 MG PO TABS
40.0000 mg | ORAL_TABLET | Freq: Every day | ORAL | Status: DC | PRN
Start: 2022-03-12 — End: 2022-03-15

## 2022-03-12 MED ORDER — METHOCARBAMOL 500 MG PO TABS
500.0000 mg | ORAL_TABLET | Freq: Three times a day (TID) | ORAL | Status: DC | PRN
  Administered 2022-03-12 – 2022-03-15 (×6): 500 mg via ORAL
  Filled 2022-03-12 (×6): qty 1

## 2022-03-12 MED ORDER — HYDROCODONE-ACETAMINOPHEN 5-325 MG PO TABS
1.0000 | ORAL_TABLET | ORAL | Status: DC | PRN
  Administered 2022-03-12 – 2022-03-15 (×9): 1 via ORAL
  Filled 2022-03-12 (×9): qty 1

## 2022-03-12 MED ORDER — AMIODARONE HCL 200 MG PO TABS
200.0000 mg | ORAL_TABLET | Freq: Every day | ORAL | Status: DC
  Administered 2022-03-12 – 2022-03-18 (×7): 200 mg via ORAL
  Filled 2022-03-12 (×7): qty 1

## 2022-03-12 MED ORDER — SENNOSIDES-DOCUSATE SODIUM 8.6-50 MG PO TABS: 1.0000 | ORAL_TABLET | Freq: Every evening | ORAL | Status: DC | PRN

## 2022-03-12 MED ORDER — ENSURE ENLIVE PO LIQD
237.0000 mL | Freq: Two times a day (BID) | ORAL | Status: DC
  Administered 2022-03-12 – 2022-03-18 (×6): 237 mL via ORAL

## 2022-03-12 MED ORDER — VITAMIN B-12 1000 MCG PO TABS
1000.0000 ug | ORAL_TABLET | Freq: Every day | ORAL | Status: DC
  Administered 2022-03-12 – 2022-03-18 (×7): 1000 ug via ORAL
  Filled 2022-03-12 (×7): qty 1

## 2022-03-12 MED ORDER — ADULT MULTIVITAMIN W/MINERALS CH
1.0000 | ORAL_TABLET | Freq: Every day | ORAL | Status: DC
Start: 2022-03-12 — End: 2022-03-18
  Administered 2022-03-12 – 2022-03-18 (×7): 1 via ORAL
  Filled 2022-03-12 (×7): qty 1

## 2022-03-12 MED ORDER — METOPROLOL SUCCINATE ER 50 MG PO TB24
50.0000 mg | ORAL_TABLET | Freq: Every day | ORAL | Status: DC
  Administered 2022-03-12 – 2022-03-16 (×4): 50 mg via ORAL
  Filled 2022-03-12 (×5): qty 1

## 2022-03-12 MED ORDER — GABAPENTIN 100 MG PO CAPS
100.0000 mg | ORAL_CAPSULE | ORAL | Status: DC
  Administered 2022-03-12 – 2022-03-18 (×4): 100 mg via ORAL
  Filled 2022-03-12 (×4): qty 1

## 2022-03-12 MED ORDER — DICLOFENAC SODIUM 1 % EX GEL: 4.0000 g | Freq: Two times a day (BID) | CUTANEOUS | Status: DC | PRN

## 2022-03-12 MED ORDER — EXEMESTANE 25 MG PO TABS
25.0000 mg | ORAL_TABLET | Freq: Every day | ORAL | Status: DC
  Administered 2022-03-12 – 2022-03-18 (×7): 25 mg via ORAL
  Filled 2022-03-12 (×7): qty 1

## 2022-03-12 MED ORDER — LEVOTHYROXINE SODIUM 25 MCG PO TABS
137.0000 ug | ORAL_TABLET | Freq: Every day | ORAL | Status: DC
  Administered 2022-03-13 – 2022-03-18 (×5): 137 ug via ORAL
  Filled 2022-03-12 (×6): qty 1

## 2022-03-12 NOTE — Progress Notes (Signed)
Initial Nutrition Assessment  DOCUMENTATION CODES:   Non-severe (moderate) malnutrition in context of chronic illness  INTERVENTION:   -Ensure Plus High Protein po BID, each supplement provides 350 kcal and 20 grams of protein.   -Multivitamin with minerals daily   NUTRITION DIAGNOSIS:   Moderate Malnutrition related to chronic illness as evidenced by mild fat depletion, mild muscle depletion.  GOAL:   Patient will meet greater than or equal to 90% of their needs  MONITOR:   PO intake, Supplement acceptance, Labs, Weight trends, I & O's  REASON FOR ASSESSMENT:   Consult Hip fracture protocol  ASSESSMENT:   87 y.o. female with medical history significant of permanent a fib, CAD, HOCM, HFpEF, hypothyroidism. Presenting with right hip pain. Admitted for right hip fracture.  Patient in room, reports feeling a lot of pain. Per ortho note, plan is for surgery of right hip on 6/12. Reports she has a good appetite, even though she is struggling with pain.  Will order Ensure supplements given increased needs.  Per weight records, no weight loss noted.  Medications: Fergon, Vitamin B-12  Labs reviewed.  NUTRITION - FOCUSED PHYSICAL EXAM:  Flowsheet Row Most Recent Value  Orbital Region Mild depletion  Upper Arm Region Mild depletion  Thoracic and Lumbar Region No depletion  Buccal Region Mild depletion  Temple Region Mild depletion  Clavicle Bone Region Mild depletion  Clavicle and Acromion Bone Region Mild depletion  Scapular Bone Region Mild depletion  Dorsal Hand Severe depletion  Patellar Region Unable to assess  [reports too much pain]  Anterior Thigh Region Unable to assess  Posterior Calf Region Unable to assess  Edema (RD Assessment) None  Hair Reviewed  Eyes Reviewed  Mouth Reviewed  Skin Reviewed       Diet Order:   Diet Order             Diet regular Room service appropriate? Yes; Fluid consistency: Thin  Diet effective now                    EDUCATION NEEDS:   No education needs have been identified at this time  Skin:  Skin Assessment: Reviewed RN Assessment  Last BM:  6/7  Height:   Ht Readings from Last 1 Encounters:  03/11/22 '5\' 8"'$  (1.727 m)    Weight:   Wt Readings from Last 1 Encounters:  03/11/22 64.7 kg    BMI:  Body mass index is 21.69 kg/m.  Estimated Nutritional Needs:   Kcal:  1600-1800  Protein:  75-90g  Fluid:  1.8L/day   Clayton Bibles, MS, RD, LDN Inpatient Clinical Dietitian Contact information available via Amion

## 2022-03-12 NOTE — Hospital Course (Addendum)
The patient is an 87 y.o. female with medical history significant of permanent a fib on amiodarone and anticoagulant Eliquis, CAD, HOCM, HFpEF, hypothyroidism as well as other comorbidities who was presenting with right hip pain after a fall found to have right femur fracture patient was admitted Ortho was consulted.She's normally on eliquis, held and started on heparin gtt. last echo 03/03/2021 with EF 60-65% no RWMA, severe focal basal septal left ventricular hypertrophy noted, indeterminate diastolic pressure RV systolic function normal, status post MitraClip x2 with residual mild to moderate mitral regurgitation noted.  Cardiology was consulted for clearance and she was cleared by cardiology and had a right hip repair on 03/15/2022 and now is off the heparin drip discharged back to Eliquis on 03/16/2022.  Yesterday she was pleasantly confused and today she is more awake and complaining of some pain but she extremely hard of hearing.  PT OT evaluated and recommending SNF and currently awaiting placement.  Orthopedic surgery recommending continuing minimal pain medications given avoiding prolonging her confusion.  She is improved and renal function is improved as well.  She is medically stable for discharging to SNF to continue rehab and will need to follow-up with PCP within 1 to 2 weeks as well as orthopedic surgery and cardiology in outpatient setting.

## 2022-03-12 NOTE — Progress Notes (Signed)
[place] for IV heparin  Indication: atrial fibrillation  Allergies  Allergen Reactions   Penicillins Anaphylaxis and Swelling    FACIAL SWELLING PATIENT HAS HAD A PCN REACTION WITH IMMEDIATE RASH, FACIAL/TONGUE/THROAT SWELLING, SOB, OR LIGHTHEADEDNESS WITH HYPOTENSION:  #  #  YES  #  #  Has patient had a PCN reaction causing severe rash involving mucus membranes or skin necrosis: No Has patient had a PCN reaction that required hospitalization: No Has patient had a PCN reaction occurring within the last 10 years: No   Propoxyphene N-Acetaminophen Swelling    tongue swelling   Levaquin [Levofloxacin] Hives   Repatha [Evolocumab] Other (See Comments)    Myalgias, fatigue, some itching and burning on her feet,  Arthralgia (Joint Pain)   Statins Other (See Comments)    Joint pain   Cortisone Rash   Cymbalta [Duloxetine Hcl] Itching and Rash   Myrbetriq [Mirabegron] Rash    Rash on face   Pseudoephedrine Other (See Comments)    Facial flushing    Patient Measurements: Height: '5\' 8"'$  (172.7 cm) Weight: 64.7 kg (142 lb 10.2 oz) IBW/kg (Calculated) : 63.9 Heparin Dosing Weight: TBW  Vital Signs: Temp: 98.2 F (36.8 C) (06/09 0422) Temp Source: Oral (06/08 1912) BP: 128/82 (06/09 0422) Pulse Rate: 72 (06/09 0422)  Labs: Recent Labs    03/11/22 1430 03/11/22 1808 03/12/22 0529  HGB 13.6  --  12.9  HCT 42.4  --  40.3  PLT 146*  --  136*  APTT  --  36 83*  LABPROT  --  16.0*  --   INR  --  1.3*  --   HEPARINUNFRC  --  >1.10* >1.10*  CREATININE 1.38*  --   --      Estimated Creatinine Clearance: 30.1 mL/min (A) (by C-G formula based on SCr of 1.38 mg/dL (H)).   Medical History: Past Medical History:  Diagnosis Date   Allergic rhinitis    Allergy    Arthritis    hands   Breast cancer ([place])    Breast cancer of upper-outer quadrant of left female breast ([place]) 11/08/2013   finished chemo and radiation 2015   CAD (coronary  artery disease)    a. 12/2013 Cath/PCI: LM nl, LAD 20p, 16m 30d, D1 30p, LCX 20p, 387mOM1 20, Mo2 40p, RCA 30p, 9962m.0x16 Promus Premier DES), 20d, RPL 30, RPDA 70p, 2m44m 65-70%.   CHF (congestive heart failure) (HCC)    Diverticulosis    HOH (hard of hearing)    bilateral, wears hearing aids   Hx of adenomatous colonic polyps 02/1999   Hyperlipidemia    Hypertension    Hypertrophic obstructive cardiomyopathy (HOCM) (HCC)[place] Cardiologist is Dr. DaltLoralie Champagneypothyroidism    Iron deficiency anemia due to chronic blood loss 01/07/2016   Low back pain    gets ESI from Dr. Max Rennis Hardingalabsorption of iron 01/07/2016   Myocardial infarction (HCC)[place]/2015   very mild-with first chemo -placed stent x1   Persistent atrial fibrillation with rapid ventricular response (HCC)[place]/08/2019   Personal history of radiation therapy 2015   left breast, scv, axilla  05/14/2014-06/26/2014  Dr SaraEppie Gibson/P radiation therapy 05/14/2014-06/26/2014   1) Left Breast  / 50 Gy in 25 fractions, 2) Left Supraclavicular fossa/ 47.5 Gy in 25 fractions, 3) Left Posterior Axillary boost / 4.825 Gy in 25 fractions, 4) Left Breast boost / 10 Gy  in 5 fractions    Wears hearing aid    left and right     Medications:  PTA Apixaban '5mg'$  PO BID-last dose reported as 03/10/2022 at 1800  Assessment: [name] on Apixaban PTA for afib admitted for right femur fracture s/p fall. Apixaban held on admission in case surgery needed. Pharmacy consulted for IV heparin dosing. CBC: Hgb WNL, Pltc slightly low at 146K.   03/12/22 Heparin level > 1.1 (falsely elevated due to lingering effects of apixaban) aPTT = 83 sec (therapeutic) with heparin gtt @ 950 units/hr No complications of therapy noted  Goal of Therapy:  Heparin level 0.3-0.7 units/ml aPTT 66-102 seconds Monitor platelets by anticoagulation protocol: Yes   Plan:  Continue heparin infusion at 950 units/hr  Heparin level falsely elevated due to recent  Apixaban use. Will use aPTT for dose titration/monitoring until aPTT and heparin level correlate Recheck aPTT in 8 hours to confirm therapeutic dose Daily CBC, heparin level, aPTT Monitor closely for s/sx of bleeding F/u ortho plans  Leone Haven, PharmD 03/12/2022,6:08 AM

## 2022-03-12 NOTE — Progress Notes (Addendum)
PROGRESS NOTE Kim [name]  ZJI:[id number] DOB: [birth year] DOA: 03/11/2022 PCP: Sueanne Margarita, DO   Brief Narrative/Hospital Course: 87 y.o. female with medical history significant of permanent a fib, CAD, HOCM, HFpEF, hypothyroidism. Presenting with right hip pain after a fall found to have right femur fracture patient was admitted Ortho was consulted.She's normally on eliquis, held and started on heparin gtt. last echo 03/03/2021 with EF 6665% no RWMA, severe focal basal septal left ventricular hypertrophy noted, indeterminate diastolic pressure RV systolic function normal, status post MitraClip x2 with residual mild to moderate mitral regurgitation noted    Subjective: Seen this am Daughter at bedside Saw ortho this am Pain on rt hip on movement.  On RA no chest pain   Assessment and Plan: Principal Problem:   Closed right hip fracture (HCC) Active Problems:   Hypothyroidism   Hypertension   CAD (coronary artery disease)   HOCM (hypertrophic obstructive cardiomyopathy) (HCC)   Breast cancer (HCC)   Permanent atrial fibrillation (HCC)   Chronic diastolic CHF (congestive heart failure) (HCC)   Stage 3a chronic kidney disease (CKD) ([facility])   Closed right hip fracture: Orthopedics on consult, most likely will be added on schedule for Monday afternoon.  Given patient's significant cardiac history getting preop clearance from cardiology.  N.p.o. after midnight Sunday, continue pain control.  Hypothyroidism: Continue home Synthroid  Hypertension CAD S/P DES RCA 2015 HOCM Chronic diastolic CHF: Blood pressure well controlled.  No chest pain.  Resume  home po prn Lasix, Toprol.  Hold Cardizem and reassess.  History of MR s/p MitraClip March 2020 stable on previous echo  Permanent atrial fibrillation: Rate controlled resume metoprolol Eliquis on hold- on heaprin gtt-hold preop as per orthopedics.  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 Benjamine Mola  Stage 3a chronic kidney disease: Stable, creatinine one-point 3 repeat BMP in a.m. Recent Labs  Lab 03/11/22 1430  BUN 29*  CREATININE 1.38*   Moderate malnutrition: Nutrition Problem: Moderate Malnutrition Etiology: chronic illness Signs/Symptoms: mild fat depletion, mild muscle depletion Interventions: MVI, Ensure Enlive (each supplement provides 350kcal and 20 grams of protein)   Body mass index is 21.69 kg/m.   DVT prophylaxis: heparin gtt Code Status:   Code Status: Full Code Family Communication: plan of care discussed with patient/daughter at bedside. Patient status is: Inpatient because of management of hip fracture Level of care: Telemetry   Dispo: The patient is from: Home            Anticipated disposition: Anticipate skilled nursing facility.   Mobility Assessment (last 72 hours)     Mobility Assessment     Row Name 03/11/22 2000 03/11/22 1400         Does patient have an order for bedrest or is patient medically unstable Yes- Bedfast (Level 1) - Complete --      What is the highest level of mobility based on the progressive mobility assessment? -- Level 3 (Stands with assist) - Balance while standing  and cannot march in place                Objective: Vitals last 24 hrs: Vitals:   03/11/22 1912 03/11/22 1939 03/11/22 2346 03/12/22 0422  BP: (!) 154/91 (!) 134/93 121/83 128/82  Pulse: 70 77 73 72  Resp:  '18 16 16  '$ Temp: 98 F (36.7 C) 98.5 F (36.9 C) 98.1 F (36.7 C) 98.2  F (36.8 C)  TempSrc: Oral     SpO2: 95% 96% 97% 98%  Weight: 64.7 kg     Height: '5\' 8"'$  (1.727 m)      Weight change:   Physical Examination: General exam: alert awake,older than stated age, weak appearing. HEENT:Oral mucosa moist, Ear/Nose WNL grossly, dentition normal. Respiratory system: bilaterally diminished BS, no use of accessory muscle Cardiovascular system: S1 & S2 +, No  JVD. Gastrointestinal system: Abdomen soft,NT,ND, BS+ Nervous System:Alert, awake, moving extremities and grossly nonfocal Extremities: LE edema neg, tender rt hip Skin: No rashes,no icterus. MSK: Normal muscle bulk,tone, power  Medications reviewed:  Scheduled Meds: Continuous Infusions:  heparin 950 Units/hr (03/11/22 2034)      Diet Order             Diet NPO time specified  Diet effective midnight                            Intake/Output Summary (Last 24 hours) at 03/12/2022 0853 Last data filed at 03/12/2022 0500 Gross per 24 hour  Intake --  Output 300 ml  Net -300 ml   Net IO Since Admission: -300 mL [03/12/22 0853]  Wt Readings from Last 3 Encounters:  03/11/22 64.7 kg  02/03/22 64.6 kg  12/29/21 63.3 kg     Unresulted Labs (From admission, onward)     Start     Ordered   03/12/22 1330  APTT  Once-Timed,   TIMED       Question:  Specimen collection method  Answer:  Lab=Lab collect   03/12/22 0612   03/12/22 0500  CBC  Daily,   R      03/11/22 2016   03/12/22 0500  Heparin level (unfractionated)  Daily,   R      03/11/22 2027          Data Reviewed: I have personally reviewed following labs and imaging studies CBC: Recent Labs  Lab 03/11/22 1430 03/12/22 0529  WBC 7.2 7.0  NEUTROABS 5.4  --   HGB 13.6 12.9  HCT 42.4 40.3  MCV 103.7* 104.4*  PLT 146* 096*   Basic Metabolic Panel: Recent Labs  Lab 03/11/22 1430  NA 138  K 4.6  CL 108  CO2 22  GLUCOSE 102*  BUN 29*  CREATININE 1.38*  CALCIUM 9.2   GFR: Estimated Creatinine Clearance: 30.1 mL/min (A) (by C-G formula based on SCr of 1.38 mg/dL (H)). Liver Function Tests: No results for input(s): "AST", "ALT", "ALKPHOS", "BILITOT", "PROT", "ALBUMIN" in the last 168 hours. No results for input(s): "LIPASE", "AMYLASE" in the last 168 hours. No results for input(s): "AMMONIA" in the last 168 hours. Coagulation Profile: Recent Labs  Lab 03/11/22 1808  INR 1.3*   BNP (last 3  results) No results for input(s): "PROBNP" in the last 8760 hours. HbA1C: No results for input(s): "HGBA1C" in the last 72 hours. CBG: No results for input(s): "GLUCAP" in the last 168 hours. Lipid Profile: No results for input(s): "CHOL", "HDL", "LDLCALC", "TRIG", "CHOLHDL", "LDLDIRECT" in the last 72 hours. Thyroid Function Tests: No results for input(s): "TSH", "T4TOTAL", "FREET4", "T3FREE", "THYROIDAB" in the last 72 hours. Sepsis Labs: No results for input(s): "PROCALCITON", "LATICACIDVEN" in the last 168 hours.  No results found for this or any previous visit (from the past 240 hour(s)).  Antimicrobials: Anti-infectives (From admission, onward)    None      Culture/Microbiology    Component Value Date/Time  SDES  01/23/2021 1824    URINE, RANDOM Performed at [facility], [address] [place], [place] [address]    [facility]  01/23/2021 1824    NONE Performed at [facility], [address] [place], [place] [address]    CULT >=100,000 COLONIES/mL ESCHERICHIA COLI (A) 01/23/2021 1824   REPTSTATUS 01/26/2021 FINAL 01/23/2021 1824    Other culture-see note  Radiology Studies: [facility] Chest Port 1 View  Result Date: 03/11/2022 CLINICAL DATA:  Preop evaluation EXAM: PORTABLE CHEST 1 VIEW COMPARISON:  01/23/2021 FINDINGS: Transverse diameter of heart is increased. There are no signs of pulmonary edema or focal pulmonary consolidation. There is no pleural effusion or pneumothorax. Surgical clips are seen in the left chest wall. IMPRESSION: Cardiomegaly. There are no signs of pulmonary edema or focal pulmonary consolidation. Electronically Signed   By: Elmer Picker M.D.   On: 03/11/2022 14:20   CT Lumbar Spine Wo Contrast  Result Date: 03/11/2022 CLINICAL DATA:  Fall EXAM: CT LUMBAR SPINE WITHOUT CONTRAST TECHNIQUE: Multidetector CT imaging of the lumbar spine was performed without intravenous contrast administration. Multiplanar CT image  reconstructions were also generated. RADIATION DOSE REDUCTION: This exam was performed according to the departmental dose-optimization program which includes automated exposure control, adjustment of the mA and/or kV according to patient size and/or use of iterative reconstruction technique. COMPARISON:  02/03/2018 CT lumbar spine, correlation is also made with 09/21/2019 MRI lumbar spine FINDINGS: Segmentation: 5 lumbar type vertebrae. Alignment: Trace anterolisthesis L4 on L5, unchanged. Mild dextrocurvature. Mild straightening of the normal lumbar lordosis. Vertebrae: No acute fracture or suspicious osseous lesion. Paraspinal and other soft tissues: No acute finding. Aortic atherosclerosis. Low-density lesions in the kidneys, possibly renal cysts. Disc levels: Multilevel degenerative changes, which appear grossly unchanged compared to the 09/21/2019 MRI, with disc height loss most prominently at L3-L4, L4-L5, and L5-S1. The previously noted L4-L5 disc extrusion with cranial extension is not well evaluated on this study. IMPRESSION: No acute fracture or traumatic listhesis in the lumbar spine. Electronically Signed   By: Merilyn Baba M.D.   On: 03/11/2022 11:38   DG Hip Unilat W or Wo Pelvis 2-3 Views Right  Result Date: 03/11/2022 CLINICAL DATA:  [place] on Monday EXAM: DG HIP (WITH OR WITHOUT PELVIS) 2-3V RIGHT COMPARISON:  11/28/2021 FINDINGS: IM nail with compression screw at proximal RIGHT femur post ORIF of intertrochanteric fracture. Osseous demineralization. Penetration of compression screw through femoral head into acetabulum new since prior exam. Heterotopic bone adjacent to the medial aspect of the proximal RIGHT femoral diaphysis. Hardware appears intact. Mild narrowing of hip joints bilaterally. No new fracture, dislocation, or bone destruction. IMPRESSION: Prior ORIF of intertrochanteric fracture RIGHT femur with previously seen compression screw now penetrating through the RIGHT femoral neck/head  into the acetabulum. Osseous demineralization with degenerative changes of both hip joints. Electronically Signed   By: Lavonia Dana M.D.   On: 03/11/2022 09:41     LOS: 1 day   Antonieta Pert, MD [facility]  03/12/2022, 8:53 AM

## 2022-03-12 NOTE — Consult Note (Addendum)
Cardiology Consultation:   Patient ID: ALAINE LOUGHNEY MRN: [id number]; DOB: [birth year]  Admit date: 03/11/2022 Date of Consult: 03/12/2022  PCP:  Sueanne Margarita, DO   [facility] Providers Cardiologist:  None  Advanced Heart Failure:  [name], MD       Patient Profile:   Kim [name] is a 87 y.o. female with a hx of HOH, CAD s/p DES to RCA 2015, persistent/permanent atrial fibrillation, MR s/p MitraClip, HOCM, CHF and h/o breast CA who is being seen 03/12/2022 for the evaluation of preoperative clearance prior to hip surgery at the request of Dr. Lupita Leash.  History of Present Illness:   Kim [name] is a pleasant 87 year old female with past medical history of HOH, CAD s/p DES to RCA 2015, persistent/permanent atrial fibrillation, MR s/p MitraClip, HOCM, CHF and h/o breast CA. during the previous breast cancer treatment in 2015, she developed unstable angina and received a DES to mid RCA in March 2015.  She has a history of severe asymmetric basal septal hypertrophy and SAM with LVOT gradient of 58 mmHg on echo in March 2015 along with moderate MR.  Repeat echocardiogram in July 2015 after initiation of beta-blocker shows LVOT gradient down to 30 mmHg.  Repeat echo in November 2015 showed no significant LVOT gradient but SAM still present.  She was diagnosed with atrial fibrillation with RVR in January 2020.  She was started on Eliquis and cardioverted after TEE.  Cardiac catheterization performed on 11/03/2018 showed well optimized to left and right heart filling pressure, normal pulmonary artery pressure, 60% ostial PDA lesion and 70 to 80% proximal LAD stenosis after large D1 takeoff.  She was seen by Dr. Burt Knack of structural heart clinic on 11/29/2018 for evaluation of significant mitral regurgitation.  He reviewed the cardiac catheterization results at the time, per note "Both vessels are small in caliber and not particularly favorable for PCI.  Considering her lack of anginal symptoms,  preserved LV function, and small caliber coronary vessels, I am not inclined to proceed with PCI."  Patient ultimately underwent MitraClip on 12/06/2018.  Patient had underwent multiple cardioversion on 08/07/2020, 03/2019 amiodarone and 11/26/2021.  ZIO monitor obtained in August 2020 showed normal sinus rhythm, short runs of SVT.  Patient had a mechanical fall with right hip fracture and underwent intramedullary nailing by Dr. Alvan Dame on 08/23/2021.  Postprocedure, she recovered okay.  After her last cardioversion in February, unfortunately she had recurrence of A-fib.  She was seen by A-fib clinic on 12/21/2021, given her history of HOCM, she is not a good candidate for Watchman device or antiarrhythmic such as Tikosyn.  Fortunately, patient has no cardiac awareness of atrial fibrillation, therefore it was eventually decided to continue amiodarone for rate control only.  She was left on Eliquis due to lack of frequent falls.  It was mentioned in the cardiology note that she would have to have "295 falls  a year to outweigh the benefit of anticoagulation."   Patient was in her usual state of health until Monday when she had another fall and hit her right hip.  Prior to that, she walks around with a walker and was able to " walk everywhere" she wanted.  She does admit that her baseline functional ability is not very good.  She does admit to have chest tightness at night when she lays down, however the symptom does not occur during the day when she walked around with a walker.  I do not think she can do 4  METS of activity.  Images showed prior ORIF of intertrochanteric fracture of the right femur was previously seen compression screw now penetrating through the right femoral neck and into the acetabulum.  Eliquis has been held in anticipation of possible procedure.  Patient has been seen by orthopedic service will likely will take the patient to the OR for revision surgery on Monday.  Cardiology service consulted for  preoperative clearance.     Past Medical History:  Diagnosis Date   Allergic rhinitis    Allergy    Arthritis    hands   Breast cancer ([place])    Breast cancer of upper-outer quadrant of left female breast ([place]) 11/08/2013   finished chemo and radiation 2015   CAD (coronary artery disease)    a. 12/2013 Cath/PCI: LM nl, LAD 20p, 55m 30d, D1 30p, LCX 20p, 332mOM1 20, Mo2 40p, RCA 30p, 9959m.0x16 Promus Premier DES), 20d, RPL 30, RPDA 70p, 46m58m 65-70%.   CHF (congestive heart failure) (HCC)    Diverticulosis    HOH (hard of hearing)    bilateral, wears hearing aids   Hx of adenomatous colonic polyps 02/1999   Hyperlipidemia    Hypertension    Hypertrophic obstructive cardiomyopathy (HOCM) (HCC)[place] Cardiologist is Dr. DaltLoralie Champagneypothyroidism    Iron deficiency anemia due to chronic blood loss 01/07/2016   Low back pain    gets ESI from Dr. Max Rennis Hardingalabsorption of iron 01/07/2016   Myocardial infarction (HCC)[place]/2015   very mild-with first chemo -placed stent x1   Persistent atrial fibrillation with rapid ventricular response (HCC)[place]/08/2019   Personal history of radiation therapy 2015   left breast, scv, axilla  05/14/2014-06/26/2014  Dr SaraEppie Gibson/P radiation therapy 05/14/2014-06/26/2014   1) Left Breast  / 50 Gy in 25 fractions, 2) Left Supraclavicular fossa/ 47.5 Gy in 25 fractions, 3) Left Posterior Axillary boost / 4.825 Gy in 25 fractions, 4) Left Breast boost / 10 Gy in 5 fractions    Wears hearing aid    left and right     Past Surgical History:  Procedure Laterality Date   ABDOMINAL HYSTERECTOMY     ANTERIOR AND POSTERIOR REPAIR N/A 01/17/2015   Procedure: CYSTOCELE REPAIR ;  Surgeon: MariPrincess Bruins;  [facility];  Service: Gynecology;  Laterality: N/A;   BILATERAL SALPINGOOPHORECTOMY     see BethLaurin Coderfor GYN exams   BREAST BIOPSY Left 10/18/2013   BREAST BIOPSY Left 10/31/2013   BREAST LUMPECTOMY Left 11/05/2013   BREAST  LUMPECTOMY WITH NEEDLE LOCALIZATION AND AXILLARY LYMPH NODE DISSECTION Left 11/05/2013   Procedure: LEFT BREAST LUMPECTOMY WITH NEEDLE LOCALIZATION and axillary lymph Node Dissection;  Surgeon: BenjEdward Jolly;  [facility] [facility];  Laterality: Left;   BREAST SURGERY  11/2013   left lumpectomy   CARDIOVERSION N/A 10/17/2018   Procedure: CARDIOVERSION;  Surgeon: McLeLarey Dresser;  [facility];  Service: Cardiovascular;  Laterality: N/A;   CARDIOVERSION N/A 11/08/2018   Procedure: CARDIOVERSION;  Surgeon: McLeLarey Dresser;  [facility];  Service: Cardiovascular;  Laterality: N/A;   CARDIOVERSION N/A 08/13/2020   Procedure: CARDIOVERSION;  Surgeon: McLeLarey Dresser;  [facility];  Service: Cardiovascular;  Laterality: N/A;   CARDIOVERSION N/A 03/13/2021   Procedure: CARDIOVERSION;  Surgeon: McLeLarey Dresser;  [facility];  Service: Cardiovascular;  Laterality: N/A;  CARDIOVERSION N/A 11/26/2021   Procedure: CARDIOVERSION;  Surgeon: Larey Dresser, MD;  Location: [facility] ENDOSCOPY;  Service: Cardiovascular;  Laterality: N/A;   CATARACT EXTRACTION W/ INTRAOCULAR LENS  IMPLANT, BILATERAL  2012   bilateral   COLONOSCOPY  11/04/2015   per Dr. Fuller Plan, adenomatous polyps, no repeats planned    EYE SURGERY  11/22/2006   cataracts, bilateral, intraocular lens implant   INTRAMEDULLARY (IM) NAIL INTERTROCHANTERIC Right 08/23/2021   Procedure: INTRAMEDULLARY (IM) NAIL INTERTROCHANTRIC;  Surgeon: Paralee Cancel, MD;  Location: [place];  Service: Orthopedics;  Laterality: Right;   JOINT REPLACEMENT     2019   LEFT HEART CATHETERIZATION WITH CORONARY ANGIOGRAM N/A 12/19/2013   Procedure: LEFT HEART CATHETERIZATION WITH CORONARY ANGIOGRAM;  Surgeon: Burnell Blanks, MD;  Location[facility] CATH LAB;  Service: Cardiovascular;  Laterality: N/A;   lumbar epidural steroid injection     lumber spinal stenosis   LUMBAR  LAMINECTOMY/DECOMPRESSION MICRODISCECTOMY Right 03/01/2018   Procedure: RIGHT LUMBAR FOUR- LUMBAR FIVE LAMINECTOMY/MICRODISCECTOMY;  Surgeon: [name], MD;  Location: [place];  Service: Neurosurgery;  Laterality: Right;   MITRAL VALVE REPAIR N/A 12/06/2018   Procedure: MITRAL VALVE REPAIR;  Surgeon: Sherren Mocha, MD;  Location: [place] CV LAB;  Service: Cardiovascular;  Laterality: N/A;   POLYPECTOMY     PORTACATH PLACEMENT Right 12/11/2013   Procedure: INSERTION PORT-A-CATH;  Surgeon: Edward Jolly, MD;  Location: [place];  Service: General;  Laterality: Right;  Subclavian Vein;    RIGHT/LEFT HEART CATH AND CORONARY ANGIOGRAPHY N/A 11/03/2018   Procedure: RIGHT/LEFT HEART CATH AND CORONARY ANGIOGRAPHY;  Surgeon: Larey Dresser, MD;  Location: [place] CV LAB;  Service: Cardiovascular;  Laterality: N/A;   TEE WITHOUT CARDIOVERSION N/A 10/17/2018   Procedure: TRANSESOPHAGEAL ECHOCARDIOGRAM (TEE);  Surgeon: Larey Dresser, MD;  Location: [facility] ENDOSCOPY;  Service: Cardiovascular;  Laterality: N/A;   TEE WITHOUT CARDIOVERSION N/A 11/08/2018   Procedure: TRANSESOPHAGEAL ECHOCARDIOGRAM (TEE);  Surgeon: Larey Dresser, MD;  Location: [facility] ENDOSCOPY;  Service: Cardiovascular;  Laterality: N/A;   TOTAL KNEE ARTHROPLASTY Right 10/24/2017   Procedure: TOTAL RIGHT KNEE ARTHROPLASTY;  Surgeon: Gaynelle Arabian, MD;  [facility];  Service: Orthopedics;  Laterality: Right;     Home Medications:  Prior to Admission medications   Medication Sig Start Date End Date Taking? Authorizing Provider  acetaminophen (TYLENOL) 500 MG tablet Take 1,000 mg by mouth 2 (two) times daily.   Yes [provider]  amiodarone (PACERONE) 200 MG tablet Take 1 tablet (200 mg total) by mouth daily. 07/13/21  Yes Larey Dresser, MD  diclofenac Sodium (VOLTAREN) 1 % GEL Apply 4 g topically 2 (two) times daily as needed (knee pain).   Yes [provider]  diclofenac Sodium  (VOLTAREN) 1 % GEL Apply 4 g topically 4 (four) times daily. 03/11/22  Yes Deno Etienne, DO  diltiazem (CARDIZEM CD) 240 MG 24 hr capsule Take 1 capsule (240 mg total) by mouth daily. 08/28/21  Yes Domenic Polite, MD  ELIQUIS 5 MG TABS tablet TAKE 1 TABLET BY MOUTH TWICE (2) DAILY Patient taking differently: Take 5 mg by mouth 2 (two) times daily. 10/06/21  Yes Larey Dresser, MD  exemestane (AROMASIN) 25 MG tablet TAKE 1 TABLET BY MOUTH ONCE DAILY AFTER BREAKFAST Patient taking differently: Take 25 mg by mouth daily after breakfast. 03/03/21  Yes Ennever, Rudell Cobb, MD  ferrous gluconate (FERGON) 324 MG tablet Take 324 mg by mouth daily with lunch.   Yes [provider]  furosemide (LASIX) 40 MG tablet TAKE 1 TABLET BY MOUTH AS NEEDED FOR FLUID OR EDEMA. . Patient taking differently: Take 40 mg by mouth daily as needed for fluid. 01/07/22  Yes Larey Dresser, MD  gabapentin (NEURONTIN) 100 MG capsule Take 100 mg by mouth every other day.   Yes [provider]  levothyroxine (SYNTHROID) 137 MCG tablet Take 137 mcg by mouth daily before breakfast.   Yes [provider]  methocarbamol (ROBAXIN) 500 MG tablet Take 1 tablet (500 mg total) by mouth every 8 (eight) hours as needed for muscle spasms. 08/24/21  Yes Irving Copas, PA-C  methylPREDNISolone (MEDROL DOSEPAK) 4 MG TBPK tablet Day 1: '8mg'$  before breakfast, 4 mg after lunch, 4 mg after supper, and 8 mg at bedtime Day 2: 4 mg before breakfast, 4 mg after lunch, 4 mg  after supper, and 8 mg  at bedtime Day 3:  4 mg  before breakfast, 4 mg  after lunch, 4 mg after supper, and 4 mg  at bedtime Day 4: 4 mg  before breakfast, 4 mg  after lunch, and 4 mg at bedtime Day 5: 4 mg  before breakfast and 4 mg at bedtime Day 6: 4 mg  before breakfast 03/11/22  Yes Deno Etienne, DO  metoprolol succinate (TOPROL-XL) 50 MG 24 hr tablet Take 50 mg by mouth at bedtime.   Yes [provider]  Multiple Vitamin (MULTIVITAMIN WITH MINERALS)  TABS tablet Take 1 tablet by mouth daily.   Yes [provider]  nitroGLYCERIN (NITROSTAT) 0.4 MG SL tablet Place 1 tablet (0.4 mg total) under the tongue every 5 (five) minutes as needed for chest pain. 07/24/20 11/05/22 Yes Clegg, Amy D, NP  potassium chloride SA (KLOR-CON M) 20 MEQ tablet TAKE 1/2 TO 1 TABLET (10-20 MEQ TOTAL) BY MOUTH AS NEEDED. ONLY TAKE 1 FULL TABLET (20 MEQ) IF YOU TAKE LASIX. Patient taking differently: Take 20 mEq by mouth daily. 01/07/22  Yes Larey Dresser, MD  senna-docusate (SENOKOT-S) 8.6-50 MG tablet Take 1 tablet by mouth at bedtime as needed for mild constipation.   Yes [provider]  traMADol (ULTRAM) 50 MG tablet Take 1 tablet (50 mg total) by mouth See admin instructions. Take 1 tablet (50 mg) by mouth (scheduled) twice daily (0800 & 2000). May take an additional dose (50 mg) by mouth once daily if needed for pain. Patient taking differently: Take 25 mg by mouth every 4 (four) hours as needed for moderate pain. 11/28/21  Yes Redwine, Madison A, PA-C  vitamin B-12 (CYANOCOBALAMIN) 1000 MCG tablet Take 1,000 mcg by mouth daily.   Yes [provider]  furosemide (LASIX) 20 MG tablet TAKE 1 TAB BY MOUTH EVERY OTHER DAY . TAKE WITH POTASSIUM 20MEQ Patient not taking: Reported on 02/03/2022 01/26/22   Larey Dresser, MD  polyethylene glycol (MIRALAX / GLYCOLAX) 17 g packet Take 17 g by mouth daily. Patient not taking: Reported on 03/11/2022 08/28/21   Domenic Polite, MD  potassium chloride (KLOR-CON) 10 MEQ tablet Take 1 tablet (10 mEq total) by mouth daily. 11/05/21 02/03/22  Larey Dresser, MD  ferrous sulfate 325 (65 FE) MG tablet Take 325 mg by mouth 2 (two) times daily.    01/16/19  [provider]  pravastatin (PRAVACHOL) 80 MG tablet Take 1 tablet (80 mg total) by mouth every evening. 11/20/15 01/16/19  Larey Dresser, MD    Inpatient Medications: Scheduled Meds:  Continuous Infusions:  heparin 950  Units/hr (03/11/22 2034)    PRN Meds: morphine injection  Allergies:    Allergies  Allergen Reactions   Penicillins Anaphylaxis and Swelling    FACIAL SWELLING PATIENT HAS HAD A PCN REACTION WITH IMMEDIATE RASH, FACIAL/TONGUE/THROAT SWELLING, SOB, OR LIGHTHEADEDNESS WITH HYPOTENSION:  #  #  YES  #  #  Has patient had a PCN reaction causing severe rash involving mucus membranes or skin necrosis: No Has patient had a PCN reaction that required hospitalization: No Has patient had a PCN reaction occurring within the last 10 years: No   Propoxyphene N-Acetaminophen Swelling    tongue swelling   Levaquin [Levofloxacin] Hives   Repatha [Evolocumab] Other (See Comments)    Myalgias, fatigue, some itching and burning on her feet,  Arthralgia (Joint Pain)   Statins Other (See Comments)    Joint pain   Cortisone Rash   Cymbalta [Duloxetine Hcl] Itching and Rash   Myrbetriq [Mirabegron] Rash    Rash on face   Pseudoephedrine Other (See Comments)    Facial flushing    Social History:   Social History   Socioeconomic History   Marital status: Widowed    Spouse name: Not on file   Number of children: 2   Years of education: Not on file   Highest education level: Not on file  Occupational History   Occupation: Retired-school system  Tobacco Use   Smoking status: Never   Smokeless tobacco: Never   Tobacco comments:    never used tobacco  Vaping Use   Vaping Use: Never used  Substance and Sexual Activity   Alcohol use: No    Alcohol/week: 0.0 standard drinks of alcohol   Drug use: No   Sexual activity: Not Currently    Birth control/protection: Post-menopausal  Other Topics Concern   Not on file  Social History Narrative   Lives at home by herself, husband died in 25-Aug-2020   Social Determinants of Health   Financial Resource Strain: Not on file  Food Insecurity: Not on file  Transportation Needs: Not on file  Physical Activity: Not on file  Stress: Not on file  Social Connections: Not on file   Intimate Partner Violence: Not on file    Family History:    Family History  Problem Relation Age of Onset   Alcohol abuse Father    Kidney failure Mother    Colon cancer Neg Hx    Rectal cancer Neg Hx    Stomach cancer Neg Hx    Colon polyps Neg Hx      ROS:  Please see the history of present illness.   All other ROS reviewed and negative.     Physical Exam/Data:   Vitals:   03/11/22 1912 03/11/22 1939 03/11/22 2346 03/12/22 0422  BP: (!) 154/91 (!) 134/93 121/83 128/82  Pulse: 70 77 73 72  Resp:  '18 16 16  '$ Temp: 98 F (36.7 C) 98.5 F (36.9 C) 98.1 F (36.7 C) 98.2 F (36.8 C)  TempSrc: Oral     SpO2: 95% 96% 97% 98%  Weight: 64.7 kg     Height: '5\' 8"'$  (1.727 m)       Intake/Output Summary (Last 24 hours) at 03/12/2022 1128 Last data filed at 03/12/2022 0952 Gross per 24 hour  Intake 591 ml  Output 300 ml  Net 291 ml      03/11/2022    7:12 PM 02/03/2022    8:43 AM 12/29/2021    1:19 PM  Last 3  Weights  Weight (lbs) 142 lb 10.2 oz 142 lb 6 oz 139 lb 9.6 oz  Weight (kg) 64.7 kg 64.581 kg 63.322 kg     Body mass index is 21.69 kg/m.  General:  Well nourished, well developed, in no acute distress HEENT: normal Neck: no JVD Vascular: No carotid bruits; Distal pulses 2+ bilaterally Cardiac:  normal S1, S2; irregularly irregular; no murmur  Lungs:  clear to auscultation bilaterally, no wheezing, rhonchi or rales  Abd: soft, nontender, no hepatomegaly  Ext: no edema Musculoskeletal:  No deformities, BUE and BLE strength normal and equal Skin: warm and dry  Neuro:  CNs 2-12 intact, no focal abnormalities noted Psych:  Normal affect   EKG:  The EKG was personally reviewed and demonstrates: A-fib versus atrial flutter, rate controlled.  Poor R wave progression in anterior leads.  Unchanged when compared with previous EKG in March Telemetry:  Telemetry was personally reviewed and demonstrates: Atrial fibrillation, heart rate well controlled in the 80s  Relevant  CV Studies:  Cath 11/03/2018 1. Well-optimized right/left heart filling pressures, normal pulmonary artery pressure, preserved cardiac output.  2. 60% ostial PDA stenosis, relatively small vessel.  3. 70-80% proximal LAD stenosis after large D1 take-off.    Echo 03/03/2021 1. Left ventricular ejection fraction, by estimation, is 60 to 65%. The  left ventricle has normal function. The left ventricle has no regional  wall motion abnormalities. There is severe focal basal septal left  ventricular hypertrophy. Left ventricular  diastolic parameters are indeterminate. LV outflow tract gradient was not  measured.   2. Right ventricular systolic function is normal. The right ventricular  size is normal. There is mildly elevated pulmonary artery systolic  pressure. The estimated right ventricular systolic pressure is 29.7 mmHg.   3. Left atrial size was severely dilated.   4. Status post Mitraclip x 2 with residual mild-moderate mitral  regurgitation. Mean gradient 6 mmHg.   5. The aortic valve is tricuspid. Aortic valve regurgitation is not  visualized. Mild aortic valve sclerosis is present, with no evidence of  aortic valve stenosis.   6. The inferior vena cava is normal in size with greater than 50%  respiratory variability, suggesting right atrial pressure of 3 mmHg.   7. The patient was in atrial fibrillation.      Laboratory Data:  High Sensitivity Troponin:  No results for input(s): "TROPONINIHS" in the last 720 hours.   Chemistry Recent Labs  Lab 03/11/22 1430  NA 138  K 4.6  CL 108  CO2 22  GLUCOSE 102*  BUN 29*  CREATININE 1.38*  CALCIUM 9.2  GFRNONAA 38*  ANIONGAP 8    No results for input(s): "PROT", "ALBUMIN", "AST", "ALT", "ALKPHOS", "BILITOT" in the last 168 hours. Lipids No results for input(s): "CHOL", "TRIG", "HDL", "LABVLDL", "LDLCALC", "CHOLHDL" in the last 168 hours.  Hematology Recent Labs  Lab 03/11/22 1430 03/12/22 0529  WBC 7.2 7.0  RBC 4.09  3.86*  HGB 13.6 12.9  HCT 42.4 40.3  MCV 103.7* 104.4*  MCH 33.3 33.4  MCHC 32.1 32.0  RDW 12.5 12.6  PLT 146* 136*   Thyroid No results for input(s): "TSH", "FREET4" in the last 168 hours.  BNPNo results for input(s): "BNP", "PROBNP" in the last 168 hours.  DDimer No results for input(s): "DDIMER" in the last 168 hours.   Radiology/Studies:  DG Chest Port 1 View  Result Date: 03/11/2022 CLINICAL DATA:  Preop evaluation EXAM: PORTABLE CHEST 1 VIEW COMPARISON:  01/23/2021 FINDINGS: Transverse  diameter of heart is increased. There are no signs of pulmonary edema or focal pulmonary consolidation. There is no pleural effusion or pneumothorax. Surgical clips are seen in the left chest wall. IMPRESSION: Cardiomegaly. There are no signs of pulmonary edema or focal pulmonary consolidation. Electronically Signed   By: Elmer Picker M.D.   On: 03/11/2022 14:20   CT Lumbar Spine Wo Contrast  Result Date: 03/11/2022 CLINICAL DATA:  Fall EXAM: CT LUMBAR SPINE WITHOUT CONTRAST TECHNIQUE: Multidetector CT imaging of the lumbar spine was performed without intravenous contrast administration. Multiplanar CT image reconstructions were also generated. RADIATION DOSE REDUCTION: This exam was performed according to the departmental dose-optimization program which includes automated exposure control, adjustment of the mA and/or kV according to patient size and/or use of iterative reconstruction technique. COMPARISON:  02/03/2018 CT lumbar spine, correlation is also made with 09/21/2019 MRI lumbar spine FINDINGS: Segmentation: 5 lumbar type vertebrae. Alignment: Trace anterolisthesis L4 on L5, unchanged. Mild dextrocurvature. Mild straightening of the normal lumbar lordosis. Vertebrae: No acute fracture or suspicious osseous lesion. Paraspinal and other soft tissues: No acute finding. Aortic atherosclerosis. Low-density lesions in the kidneys, possibly renal cysts. Disc levels: Multilevel degenerative changes,  which appear grossly unchanged compared to the 09/21/2019 MRI, with disc height loss most prominently at L3-L4, L4-L5, and L5-S1. The previously noted L4-L5 disc extrusion with cranial extension is not well evaluated on this study. IMPRESSION: No acute fracture or traumatic listhesis in the lumbar spine. Electronically Signed   By: Merilyn Baba M.D.   On: 03/11/2022 11:38   DG Hip Unilat W or Wo Pelvis 2-3 Views Right  Result Date: 03/11/2022 CLINICAL DATA:  [place] on Monday EXAM: DG HIP (WITH OR WITHOUT PELVIS) 2-3V RIGHT COMPARISON:  11/28/2021 FINDINGS: IM nail with compression screw at proximal RIGHT femur post ORIF of intertrochanteric fracture. Osseous demineralization. Penetration of compression screw through femoral head into acetabulum new since prior exam. Heterotopic bone adjacent to the medial aspect of the proximal RIGHT femoral diaphysis. Hardware appears intact. Mild narrowing of hip joints bilaterally. No new fracture, dislocation, or bone destruction. IMPRESSION: Prior ORIF of intertrochanteric fracture RIGHT femur with previously seen compression screw now penetrating through the RIGHT femoral neck/head into the acetabulum. Osseous demineralization with degenerative changes of both hip joints. Electronically Signed   By: Lavonia Dana M.D.   On: 03/11/2022 09:41     Assessment and Plan:   Preoperative clearance:  -Patient has recurrent fall on the right side injuring her right hip.  Orthopedic service is thinking of taking the patient back to the OR on Monday for revision surgery -She was previously able to tolerate a intramedullary nailing surgery in November by Dr.Olin after right hip fracture at that time. -Talking with the patient, she does have chest tightness at night when she lays down, however the same chest tightness does not occur with physical activity.  Symptoms somewhat atypical.  Previous cardiac catheterization on 11/03/2018 revealed a 60% ostial PDA stenosis, 70 to 80%  proximal LAD stenosis after a large D1 takeoff.  This was reviewed by Dr. Burt Knack prior to mitral clips procedure.  Medical therapy was more appropriate given lack of symptom and the small caliber of the artery. -Despite the recent resting chest discomfort, she does not have any obvious chest pain with exertion.  She was able to tolerate the previous intramedullary nailing surgery in November.  I think the patient is at least a moderate risk patient going to with a upcoming surgery however it is  not prohibitive.  Will check echocardiogram, if stable no further cardiac work-up recommended prior to procedure  Right hip injury: She had right hip fracture in November after a fall and required intramedullary nailing.  She had another fall on Monday, imaging of the hip shows compression screw not penetrating through the right femoral neck/head into the acetabulum.  She will need to go back to the OR for revision surgery next Monday  CAD s/p DES to RCA 2015  Persistent/permanent atrial fibrillation: Underwent multiple cardioversions in the past.  Last cardioversion in February.  However patient had recurrence of A-fib.  She was seen by A-fib clinic in March, given lack of cardiac awareness, rate control management was recommended.  She was left on amiodarone for rate control.  Eliquis on hold in anticipation of surgery.  -All home medication has been held for some reason, recommend to resume metoprolol succinate and amiodarone.  We will hold off on resuming Eliquis until after the surgery next Monday  History of MR s/p MitraClip March 2020: Stable on previous echocardiogram  HOCM  History of diastolic heart failure: Euvolemic on exam.  Very hard of hearing    Risk Assessment/Risk Scores:          CHA2DS2-VASc Score = 6   This indicates a 9.7% annual risk of stroke. The patient's score is based upon: CHF History: 1 HTN History: 1 Diabetes History: 0 Stroke History: 0 Vascular Disease History:  1 Age Score: 2 Gender Score: 1         For questions or updates, please contact [place] Please consult [email or web address] for contact info under    Hilbert Corrigan, [place]  03/12/2022 11:28 AM   Patient seen and examined.  Agree with above documentation.  Kim [name] is a 87 year old female with a history of CAD status post DES to RCA 2015, likely permanent atrial fibrillation, MR status post MitraClip, hypertrophic cardiomyopathy, breast cancer who we are consulted for preoperative evaluation by Dr. Lupita Leash.  History of unstable angina with DES to mid RCA in 2015.  Most recent cath 11/03/2018 showed 60% ostial PDA lesion and 70 to 80% proximal LAD stenosis; she was subsequently seen by Dr. Burt Knack in structural heart clinic and felt that both vessels were small in caliber and not favorable for PCI, and given her preserved LV systolic function and small caliber vessels, recommended medical management.  She underwent MitraClip 12/06/2018.  She had a fall and suffered hip fracture 08/23/2021 and underwent intramedullary nailing.  No complications.  She follows in the A-fib clinic for persistent A-fib, last seen 12/21/2021.  Was continued on amiodarone for rate control only.  She remains on Eliquis.  On Monday she had another fall and suffered a fracture.  Eliquis has been held.  Cardiology consulted for preoperative evaluation.  Most recent echo 03/03/2021 shows EF 60 to 65%, severe basal septal LV hypertrophy, normal RV function, status post MitraClip x2 with mild to moderate mitral regurgitation.  EKG shows atrial flutter with variable conduction, rate 75.  On exam, patient is alert and oriented, irregular rhythm, normal rate, no murmurs, lungs CTAB, no LE edema or JVD.  She walks with a walker.  Most exertion she does is walking around her home.  She denies any exertional chest pain or dyspnea.  For her preoperative evaluation, she denies any exertional symptoms but functional capacity is less than 4 METS.  She  has no known obstructive CAD, but was in small vessels and medical management recommended.  RCRI  score 2 (CAD, CHF), corresponding to a 10% 30-day risk of major adverse cardiac event.  Given her age and comorbidities, she is at least moderate risk for the procedure, but she is not at prohibitive risk.  She tolerated the procedure without complication in November.  She currently appears compensated.  Will check echocardiogram, if stable no further work-up recommended from cardiac standpoint  Donato Heinz, MD

## 2022-03-12 NOTE — Consult Note (Signed)
ORTHOPAEDIC CONSULTATION  REQUESTING PHYSICIAN: Antonieta Pert, MD  PCP:  Sueanne Margarita, DO  Chief Complaint: Fall   HPI: Kim [name] is a 87 y.o. female who had a fall onto her hip when trying to get back into bed a few nights ago. She has a history of right intertrochanteric femur fracture which was treated with IM nail in 2022 by Dr. Alvan Dame. She contacted [facility] regarding need for X-ray, but ended up going to the ED for evaluation. Imaging in the ED revealed failed hardware with lag screw protruding through the femoral head. I reached out to the ED physician regarding these findings and need for admission. She was admitted to the hospitalist service for medical management & pain control while awaiting surgery.  Patient is resting in bed on exam this morning. She tells [place] that her hip had not bothered her since surgery last year. She lives at home currently. She has a history of atrial fibrillation on Eliquis. She requests we call her daughter for an update.     Past Medical History:  Diagnosis Date   Allergic rhinitis    Allergy    Arthritis    hands   Breast cancer ([place])    Breast cancer of upper-outer quadrant of left female breast ([place]) 11/08/2013   finished chemo and radiation 2015   CAD (coronary artery disease)    a. 12/2013 Cath/PCI: LM nl, LAD 20p, 65m 30d, D1 30p, LCX 20p, 325mOM1 20, Mo2 40p, RCA 30p, 9916m.0x16 Promus Premier DES), 20d, RPL 30, RPDA 70p, 57m27m 65-70%.   CHF (congestive heart failure) (HCC)    Diverticulosis    HOH (hard of hearing)    bilateral, wears hearing aids   Hx of adenomatous colonic polyps 02/1999   Hyperlipidemia    Hypertension    Hypertrophic obstructive cardiomyopathy (HOCM) (HCC)[place] Cardiologist is Dr. DaltLoralie Champagneypothyroidism    Iron deficiency anemia due to chronic blood loss 01/07/2016   Low back pain    gets ESI from Dr. Max Rennis Hardingalabsorption of iron 01/07/2016   Myocardial infarction (HCC)[place]/2015    very mild-with first chemo -placed stent x1   Persistent atrial fibrillation with rapid ventricular response (HCC)[place]/08/2019   Personal history of radiation therapy 2015   left breast, scv, axilla  05/14/2014-06/26/2014  Dr SaraEppie Gibson/P radiation therapy 05/14/2014-06/26/2014   1) Left Breast  / 50 Gy in 25 fractions, 2) Left Supraclavicular fossa/ 47.5 Gy in 25 fractions, 3) Left Posterior Axillary boost / 4.825 Gy in 25 fractions, 4) Left Breast boost / 10 Gy in 5 fractions    Wears hearing aid    left and right    Past Surgical History:  Procedure Laterality Date   ABDOMINAL HYSTERECTOMY     ANTERIOR AND POSTERIOR REPAIR N/A 01/17/2015   Procedure: CYSTOCELE REPAIR ;  Surgeon: MariPrincess Bruins;  [facility];  Service: Gynecology;  Laterality: N/A;   BILATERAL SALPINGOOPHORECTOMY     see BethLaurin Coderfor GYN exams   BREAST BIOPSY Left 10/18/2013   BREAST BIOPSY Left 10/31/2013   BREAST LUMPECTOMY Left 11/05/2013   BREAST LUMPECTOMY WITH NEEDLE LOCALIZATION AND AXILLARY LYMPH NODE DISSECTION Left 11/05/2013   Procedure: LEFT BREAST LUMPECTOMY WITH NEEDLE LOCALIZATION and axillary lymph Node Dissection;  Surgeon: BenjEdward Jolly;  [facility];  Laterality: Left;   BREAST SURGERY  11/2013  left lumpectomy   CARDIOVERSION N/A 10/17/2018   Procedure: CARDIOVERSION;  Surgeon: Larey Dresser, MD;  Location: [facility] ENDOSCOPY;  Service: Cardiovascular;  Laterality: N/A;   CARDIOVERSION N/A 11/08/2018   Procedure: CARDIOVERSION;  Surgeon: Larey Dresser, MD;  Location: [facility] ENDOSCOPY;  Service: Cardiovascular;  Laterality: N/A;   CARDIOVERSION N/A 08/13/2020   Procedure: CARDIOVERSION;  Surgeon: Larey Dresser, MD;  Location: [facility] ENDOSCOPY;  Service: Cardiovascular;  Laterality: N/A;   CARDIOVERSION N/A 03/13/2021   Procedure: CARDIOVERSION;  Surgeon: Larey Dresser, MD;  Location: [facility] ENDOSCOPY;  Service: Cardiovascular;  Laterality:  N/A;   CARDIOVERSION N/A 11/26/2021   Procedure: CARDIOVERSION;  Surgeon: Larey Dresser, MD;  Location: [facility] ENDOSCOPY;  Service: Cardiovascular;  Laterality: N/A;   CATARACT EXTRACTION [place], BILATERAL  2012   bilateral   COLONOSCOPY  11/04/2015   per Dr. Fuller Plan, adenomatous polyps, no repeats planned    EYE SURGERY  11/22/2006   cataracts, bilateral, intraocular lens implant   INTRAMEDULLARY (IM) NAIL INTERTROCHANTERIC Right 08/23/2021   Procedure: INTRAMEDULLARY (IM) NAIL INTERTROCHANTRIC;  Surgeon: Paralee Cancel, MD;  Location: [place];  Service: Orthopedics;  Laterality: Right;   JOINT REPLACEMENT     2019   LEFT HEART CATHETERIZATION WITH CORONARY ANGIOGRAM N/A 12/19/2013   Procedure: LEFT HEART CATHETERIZATION WITH CORONARY ANGIOGRAM;  Surgeon: Burnell Blanks, MD;  Location: [facility] CATH LAB;  Service: Cardiovascular;  Laterality: N/A;   lumbar epidural steroid injection     lumber spinal stenosis   LUMBAR LAMINECTOMY/DECOMPRESSION MICRODISCECTOMY Right 03/01/2018   Procedure: RIGHT LUMBAR FOUR- LUMBAR FIVE LAMINECTOMY/MICRODISCECTOMY;  Surgeon: [name], MD;  Location: [place];  Service: Neurosurgery;  Laterality: Right;   MITRAL VALVE REPAIR N/A 12/06/2018   Procedure: MITRAL VALVE REPAIR;  Surgeon: Sherren Mocha, MD;  Location: [place] CV LAB;  Service: Cardiovascular;  Laterality: N/A;   POLYPECTOMY     PORTACATH PLACEMENT Right 12/11/2013   Procedure: INSERTION PORT-A-CATH;  Surgeon: Edward Jolly, MD;  Location: [place];  Service: General;  Laterality: Right;  Subclavian Vein;    RIGHT/LEFT HEART CATH AND CORONARY ANGIOGRAPHY N/A 11/03/2018   Procedure: RIGHT/LEFT HEART CATH AND CORONARY ANGIOGRAPHY;  Surgeon: Larey Dresser, MD;  Location: [place] CV LAB;  Service: Cardiovascular;  Laterality: N/A;   TEE WITHOUT CARDIOVERSION N/A 10/17/2018   Procedure: TRANSESOPHAGEAL ECHOCARDIOGRAM (TEE);  Surgeon: Larey Dresser,  MD;  Location: [facility] ENDOSCOPY;  Service: Cardiovascular;  Laterality: N/A;   TEE WITHOUT CARDIOVERSION N/A 11/08/2018   Procedure: TRANSESOPHAGEAL ECHOCARDIOGRAM (TEE);  Surgeon: Larey Dresser, MD;  Location: [facility] ENDOSCOPY;  Service: Cardiovascular;  Laterality: N/A;   TOTAL KNEE ARTHROPLASTY Right 10/24/2017   Procedure: TOTAL RIGHT KNEE ARTHROPLASTY;  Surgeon: Gaynelle Arabian, MD;  Location: [facility];  Service: Orthopedics;  Laterality: Right;   Social History   Socioeconomic History   Marital status: Widowed    Spouse name: Not on file   Number of children: 2   Years of education: Not on file   Highest education level: Not on file  Occupational History   Occupation: Retired-school system  Tobacco Use   Smoking status: Never   Smokeless tobacco: Never   Tobacco comments:    never used tobacco  Vaping Use   Vaping Use: Never used  Substance and Sexual Activity   Alcohol use: No    Alcohol/week: 0.0 standard drinks of alcohol   Drug use: No   Sexual activity: Not Currently  Birth control/protection: Post-menopausal  Other Topics Concern   Not on file  Social History Narrative   Lives at home by herself, husband died in 08-Sep-2020   Social Determinants of Health   Financial Resource Strain: Not on file  Food Insecurity: Not on file  Transportation Needs: Not on file  Physical Activity: Not on file  Stress: Not on file  Social Connections: Not on file   Family History  Problem Relation Age of Onset   Alcohol abuse Father    Kidney failure Mother    Colon cancer Neg Hx    Rectal cancer Neg Hx    Stomach cancer Neg Hx    Colon polyps Neg Hx    Allergies  Allergen Reactions   Penicillins Anaphylaxis and Swelling    FACIAL SWELLING PATIENT HAS HAD A PCN REACTION WITH IMMEDIATE RASH, FACIAL/TONGUE/THROAT SWELLING, SOB, OR LIGHTHEADEDNESS WITH HYPOTENSION:  #  #  YES  #  #  Has patient had a PCN reaction causing severe rash involving mucus membranes or skin necrosis:  No Has patient had a PCN reaction that required hospitalization: No Has patient had a PCN reaction occurring within the last 10 years: No   Propoxyphene N-Acetaminophen Swelling    tongue swelling   Levaquin [Levofloxacin] Hives   Repatha [Evolocumab] Other (See Comments)    Myalgias, fatigue, some itching and burning on her feet,  Arthralgia (Joint Pain)   Statins Other (See Comments)    Joint pain   Cortisone Rash   Cymbalta [Duloxetine Hcl] Itching and Rash   Myrbetriq [Mirabegron] Rash    Rash on face   Pseudoephedrine Other (See Comments)    Facial flushing   Prior to Admission medications   Medication Sig Start Date End Date Taking? Authorizing Provider  acetaminophen (TYLENOL) 500 MG tablet Take 1,000 mg by mouth 2 (two) times daily.   Yes [provider]  amiodarone (PACERONE) 200 MG tablet Take 1 tablet (200 mg total) by mouth daily. 07/13/21  Yes Larey Dresser, MD  diclofenac Sodium (VOLTAREN) 1 % GEL Apply 4 g topically 2 (two) times daily as needed (knee pain).   Yes [provider]  diclofenac Sodium (VOLTAREN) 1 % GEL Apply 4 g topically 4 (four) times daily. 03/11/22  Yes Deno Etienne, DO  diltiazem (CARDIZEM CD) 240 MG 24 hr capsule Take 1 capsule (240 mg total) by mouth daily. 08/28/21  Yes Domenic Polite, MD  ELIQUIS 5 MG TABS tablet TAKE 1 TABLET BY MOUTH TWICE (2) DAILY Patient taking differently: Take 5 mg by mouth 2 (two) times daily. 10/06/21  Yes Larey Dresser, MD  exemestane (AROMASIN) 25 MG tablet TAKE 1 TABLET BY MOUTH ONCE DAILY AFTER BREAKFAST Patient taking differently: Take 25 mg by mouth daily after breakfast. 03/03/21  Yes Ennever, Rudell Cobb, MD  ferrous gluconate (FERGON) 324 MG tablet Take 324 mg by mouth daily with lunch.   Yes [provider]  furosemide (LASIX) 40 MG tablet TAKE 1 TABLET BY MOUTH AS NEEDED FOR FLUID OR EDEMA. . Patient taking differently: Take 40 mg by mouth daily as needed for fluid. 01/07/22  Yes Larey Dresser, MD  gabapentin (NEURONTIN) 100 MG capsule Take 100 mg by mouth every other day.   Yes [provider]  levothyroxine (SYNTHROID) 137 MCG tablet Take 137 mcg by mouth daily before breakfast.   Yes [provider]  methocarbamol (ROBAXIN) 500 MG tablet Take 1 tablet (500 mg total) by mouth every 8 (  eight) hours as needed for muscle spasms. 08/24/21  Yes Irving Copas, PA-C  methylPREDNISolone (MEDROL DOSEPAK) 4 MG TBPK tablet Day 1: '8mg'$  before breakfast, 4 mg after lunch, 4 mg after supper, and 8 mg at bedtime Day 2: 4 mg before breakfast, 4 mg after lunch, 4 mg  after supper, and 8 mg  at bedtime Day 3:  4 mg  before breakfast, 4 mg  after lunch, 4 mg after supper, and 4 mg  at bedtime Day 4: 4 mg  before breakfast, 4 mg  after lunch, and 4 mg at bedtime Day 5: 4 mg  before breakfast and 4 mg at bedtime Day 6: 4 mg  before breakfast 03/11/22  Yes Deno Etienne, DO  metoprolol succinate (TOPROL-XL) 50 MG 24 hr tablet Take 50 mg by mouth at bedtime.   Yes [provider]  Multiple Vitamin (MULTIVITAMIN WITH MINERALS) TABS tablet Take 1 tablet by mouth daily.   Yes [provider]  nitroGLYCERIN (NITROSTAT) 0.4 MG SL tablet Place 1 tablet (0.4 mg total) under the tongue every 5 (five) minutes as needed for chest pain. 07/24/20 11/05/22 Yes Clegg, Amy D, NP  potassium chloride SA (KLOR-CON M) 20 MEQ tablet TAKE 1/2 TO 1 TABLET (10-20 MEQ TOTAL) BY MOUTH AS NEEDED. ONLY TAKE 1 FULL TABLET (20 MEQ) IF YOU TAKE LASIX. Patient taking differently: Take 20 mEq by mouth daily. 01/07/22  Yes Larey Dresser, MD  senna-docusate (SENOKOT-S) 8.6-50 MG tablet Take 1 tablet by mouth at bedtime as needed for mild constipation.   Yes [provider]  traMADol (ULTRAM) 50 MG tablet Take 1 tablet (50 mg total) by mouth See admin instructions. Take 1 tablet (50 mg) by mouth (scheduled) twice daily (0800 & 2000). May take an additional dose (50 mg) by mouth once daily if needed  for pain. Patient taking differently: Take 25 mg by mouth every 4 (four) hours as needed for moderate pain. 11/28/21  Yes Redwine, Madison A, PA-C  vitamin B-12 (CYANOCOBALAMIN) 1000 MCG tablet Take 1,000 mcg by mouth daily.   Yes [provider]  furosemide (LASIX) 20 MG tablet TAKE 1 TAB BY MOUTH EVERY OTHER DAY . TAKE WITH POTASSIUM 20MEQ Patient not taking: Reported on 02/03/2022 01/26/22   Larey Dresser, MD  polyethylene glycol (MIRALAX / GLYCOLAX) 17 g packet Take 17 g by mouth daily. Patient not taking: Reported on 03/11/2022 08/28/21   Domenic Polite, MD  potassium chloride (KLOR-CON) 10 MEQ tablet Take 1 tablet (10 mEq total) by mouth daily. 11/05/21 02/03/22  Larey Dresser, MD  ferrous sulfate 325 (65 FE) MG tablet Take 325 mg by mouth 2 (two) times daily.    01/16/19  [provider]  pravastatin (PRAVACHOL) 80 MG tablet Take 1 tablet (80 mg total) by mouth every evening. 11/20/15 01/16/19  Larey Dresser, MD   DG Chest Port 1 View  Result Date: 03/11/2022 CLINICAL DATA:  Preop evaluation EXAM: PORTABLE CHEST 1 VIEW COMPARISON:  01/23/2021 FINDINGS: Transverse diameter of heart is increased. There are no signs of pulmonary edema or focal pulmonary consolidation. There is no pleural effusion or pneumothorax. Surgical clips are seen in the left chest wall. IMPRESSION: Cardiomegaly. There are no signs of pulmonary edema or focal pulmonary consolidation. Electronically Signed   By: Elmer Picker M.D.   On: 03/11/2022 14:20   CT Lumbar Spine Wo Contrast  Result Date: 03/11/2022 CLINICAL DATA:  Fall EXAM: CT LUMBAR SPINE WITHOUT CONTRAST TECHNIQUE: Multidetector CT  imaging of the lumbar spine was performed without intravenous contrast administration. Multiplanar CT image reconstructions were also generated. RADIATION DOSE REDUCTION: This exam was performed according to the departmental dose-optimization program which includes automated exposure control, adjustment of the mA  and/or kV according to patient size and/or use of iterative reconstruction technique. COMPARISON:  02/03/2018 CT lumbar spine, correlation is also made with 09/21/2019 MRI lumbar spine FINDINGS: Segmentation: 5 lumbar type vertebrae. Alignment: Trace anterolisthesis L4 on L5, unchanged. Mild dextrocurvature. Mild straightening of the normal lumbar lordosis. Vertebrae: No acute fracture or suspicious osseous lesion. Paraspinal and other soft tissues: No acute finding. Aortic atherosclerosis. Low-density lesions in the kidneys, possibly renal cysts. Disc levels: Multilevel degenerative changes, which appear grossly unchanged compared to the 09/21/2019 MRI, with disc height loss most prominently at L3-L4, L4-L5, and L5-S1. The previously noted L4-L5 disc extrusion with cranial extension is not well evaluated on this study. IMPRESSION: No acute fracture or traumatic listhesis in the lumbar spine. Electronically Signed   By: Merilyn Baba M.D.   On: 03/11/2022 11:38   DG Hip Unilat W or Wo Pelvis 2-3 Views Right  Result Date: 03/11/2022 CLINICAL DATA:  [place] on Monday EXAM: DG HIP (WITH OR WITHOUT PELVIS) 2-3V RIGHT COMPARISON:  11/28/2021 FINDINGS: IM nail with compression screw at proximal RIGHT femur post ORIF of intertrochanteric fracture. Osseous demineralization. Penetration of compression screw through femoral head into acetabulum new since prior exam. Heterotopic bone adjacent to the medial aspect of the proximal RIGHT femoral diaphysis. Hardware appears intact. Mild narrowing of hip joints bilaterally. No new fracture, dislocation, or bone destruction. IMPRESSION: Prior ORIF of intertrochanteric fracture RIGHT femur with previously seen compression screw now penetrating through the RIGHT femoral neck/head into the acetabulum. Osseous demineralization with degenerative changes of both hip joints. Electronically Signed   By: Lavonia Dana M.D.   On: 03/11/2022 09:41    Positive ROS: All other systems have been  reviewed and were otherwise negative with the exception of those mentioned in the HPI and as above.  Physical Exam: General: Alert, no acute distress Cardiovascular: No pedal edema Respiratory: No cyanosis, no use of accessory musculature   MUSCULOSKELETAL:  Right Hip Exam: Skin intact. No ecchymosis or lesions present. Pain with gentle log rolling of the hip. Moves foot and toes well on exam. Pulses and sensation intact.   Assessment: Failed intramedullary nail, right femur  Plan: Dr. Alvan Dame and I discussed with the patient her diagnosis of failed right hip hardware and need for revision surgery. Most likely, this will be added on Monday afternoon. For now she may have a regular diet and continue heparin for anticoagulation. NPO after midnight on Sunday. Continue pain management.   She may get up and WBAT although this will likely be very limited based on pain. She may get in the recliner if she wishes.     Irving Copas, PA-C Cell [phone]   03/12/2022 7:55 AM

## 2022-03-12 NOTE — Progress Notes (Signed)
Kim [name] for IV heparin  Indication: atrial fibrillation  Allergies  Allergen Reactions   Penicillins Anaphylaxis and Swelling    FACIAL SWELLING PATIENT HAS HAD A PCN REACTION WITH IMMEDIATE RASH, FACIAL/TONGUE/THROAT SWELLING, SOB, OR LIGHTHEADEDNESS WITH HYPOTENSION:  #  #  YES  #  #  Has patient had a PCN reaction causing severe rash involving mucus membranes or skin necrosis: No Has patient had a PCN reaction that required hospitalization: No Has patient had a PCN reaction occurring within the last 10 years: No   Propoxyphene N-Acetaminophen Swelling    tongue swelling   Levaquin [Levofloxacin] Hives   Repatha [Evolocumab] Other (See Comments)    Myalgias, fatigue, some itching and burning on her feet,  Arthralgia (Joint Pain)   Statins Other (See Comments)    Joint pain   Cortisone Rash   Cymbalta [Duloxetine Hcl] Itching and Rash   Myrbetriq [Mirabegron] Rash    Rash on face   Pseudoephedrine Other (See Comments)    Facial flushing    Patient Measurements: Height: '5\' 8"'$  (172.7 cm) Weight: 64.7 kg (142 lb 10.2 oz) IBW/kg (Calculated) : 63.9 Heparin Dosing Weight: TBW  Vital Signs: Temp: 98.3 F (36.8 C) (06/09 1544) BP: 127/64 (06/09 1544) Pulse Rate: 77 (06/09 1544)  Labs: Recent Labs    03/11/22 1430 03/11/22 1808 03/12/22 0529 03/12/22 1349  HGB 13.6  --  12.9  --   HCT 42.4  --  40.3  --   PLT 146*  --  136*  --   APTT  --  36 83* 97*  LABPROT  --  16.0*  --   --   INR  --  1.3*  --   --   HEPARINUNFRC  --  >1.10* >1.10*  --   CREATININE 1.38*  --   --   --      Estimated Creatinine Clearance: 30.1 mL/min (A) (by C-G formula based on SCr of 1.38 mg/dL (H)).   Medical History: Past Medical History:  Diagnosis Date   Allergic rhinitis    Allergy    Arthritis    hands   Breast cancer ([place])    Breast cancer of upper-outer quadrant of left female breast ([place]) 11/08/2013   finished chemo and radiation  2015   CAD (coronary artery disease)    a. 12/2013 Cath/PCI: LM nl, LAD 20p, 71m 30d, D1 30p, LCX 20p, 34mOM1 20, Mo2 40p, RCA 30p, 9926m.0x16 Promus Premier DES), 20d, RPL 30, RPDA 70p, 76m13m 65-70%.   CHF (congestive heart failure) (HCC)    Diverticulosis    HOH (hard of hearing)    bilateral, wears hearing aids   Hx of adenomatous colonic polyps 02/1999   Hyperlipidemia    Hypertension    [facility] is Dr. DaltLoralie Champagneypothyroidism    Iron deficiency anemia due to chronic blood loss 01/07/2016   Low back pain    gets ESI from Dr. Max Rennis Hardingalabsorption of iron 01/07/2016   Myocardial infarction (HCC)[place]/2015   very mild-with first chemo -placed stent x1   Persistent atrial fibrillation with rapid ventricular response (HCC)[place]/08/2019   Personal history of radiation therapy 2015   left breast, scv, axilla  05/14/2014-06/26/2014  Dr SaraEppie Gibson/P radiation therapy 05/14/2014-06/26/2014   1) Left Breast  / 50 Gy in 25 fractions, 2) Left Supraclavicular fossa/ 47.5 Gy in 25  fractions, 3) Left Posterior Axillary boost / 4.825 Gy in 25 fractions, 4) Left Breast boost / 10 Gy in 5 fractions    Wears hearing aid    left and right     Medications:  PTA Apixaban '5mg'$  PO BID-last dose reported as 03/10/2022 at 1800  Assessment: [name] on Apixaban PTA for afib admitted for right femur fracture s/p fall. Apixaban held on admission in case surgery needed. Pharmacy consulted for IV heparin dosing. CBC: Hgb WNL, Pltc slightly low at 136K.   03/12/22 Heparin level > 1.1 (falsely elevated due to lingering effects of apixaban) 13:49 aPTT = 97 sec (therapeutic) with heparin gtt @ 950 units/hr No complications of therapy noted  Goal of Therapy:  Heparin level 0.3-0.7 units/ml aPTT 66-102 seconds Monitor platelets by anticoagulation protocol: Yes   Plan:  Continue heparin infusion at 950 units/hr  Heparin level falsely  elevated due to recent Apixaban use. Will use aPTT for dose titration/monitoring until aPTT and heparin level correlate Daily CBC, heparin level, aPTT Monitor closely for s/sx of bleeding F/u ortho plans -surgery likely on Monday, 6/12  Kim [name], PharmD, [place] Please utilize Amion for appropriate phone number to reach the unit pharmacist ([place]) 03/12/2022 4:55 PM

## 2022-03-13 ENCOUNTER — Inpatient Hospital Stay (HOSPITAL_COMMUNITY): Payer: Medicare PPO

## 2022-03-13 DIAGNOSIS — S72001A Fracture of unspecified part of neck of right femur, initial encounter for closed fracture: Secondary | ICD-10-CM | POA: Diagnosis not present

## 2022-03-13 DIAGNOSIS — Z01810 Encounter for preprocedural cardiovascular examination: Secondary | ICD-10-CM

## 2022-03-13 DIAGNOSIS — I5032 Chronic diastolic (congestive) heart failure: Secondary | ICD-10-CM

## 2022-03-13 LAB — CBC
HCT: 41.1 % (ref 36.0–46.0)
Hemoglobin: 13.6 g/dL (ref 12.0–15.0)
MCH: 34 pg (ref 26.0–34.0)
MCHC: 33.1 g/dL (ref 30.0–36.0)
MCV: 102.8 fL — ABNORMAL HIGH (ref 80.0–100.0)
Platelets: 148 10*3/uL — ABNORMAL LOW (ref 150–400)
RBC: 4 MIL/uL (ref 3.87–5.11)
RDW: 12.6 % (ref 11.5–15.5)
WBC: 5.2 10*3/uL (ref 4.0–10.5)
nRBC: 0 % (ref 0.0–0.2)

## 2022-03-13 LAB — BASIC METABOLIC PANEL
Anion gap: 7 (ref 5–15)
BUN: 25 mg/dL — ABNORMAL HIGH (ref 8–23)
CO2: 27 mmol/L (ref 22–32)
Calcium: 9.2 mg/dL (ref 8.9–10.3)
Chloride: 104 mmol/L (ref 98–111)
Creatinine, Ser: 1.01 mg/dL — ABNORMAL HIGH (ref 0.44–1.00)
GFR, Estimated: 55 mL/min — ABNORMAL LOW (ref >60)
Glucose, Bld: 94 mg/dL (ref 70–99)
Potassium: 4.1 mmol/L (ref 3.5–5.1)
Sodium: 138 mmol/L (ref 135–145)

## 2022-03-13 LAB — ECHOCARDIOGRAM COMPLETE
AR max vel: 2.35 cm2
AV Peak grad: 7.4 mmHg
Ao pk vel: 1.36 m/s
Height: 68 in
S' Lateral: 2.1 cm
Weight: 2282.2 oz

## 2022-03-13 LAB — APTT: aPTT: 76 seconds — ABNORMAL HIGH (ref 24–36)

## 2022-03-13 LAB — HEPARIN LEVEL (UNFRACTIONATED): Heparin Unfractionated: 1.08 IU/mL — ABNORMAL HIGH (ref 0.30–0.70)

## 2022-03-13 NOTE — Progress Notes (Signed)
PROGRESS NOTE  Kim [name] NOB:[id number] DOB: September 08, 1935 DOA: 03/11/2022 PCP: Sueanne Margarita, DO  HPI/Recap of past 24 hours: 87 y.o. female with medical history significant of permanent a fib on amiodarone and Eliquis, CAD, HOCM, HFpEF, hypothyroidism. Presenting with right hip pain after a fall found to have right femur fracture.  Cardiology consulted for cardiac clearance.  Plan for orthopedics intervention on Monday, 03/23/2022.  Currently on heparin drip for CVA prevention.  03/13/22:  No new complaints.  Pain is well controlled.  Assessment/Plan: Principal Problem:   Closed right hip fracture (HCC) Active Problems:   Hypothyroidism   Hypertension   CAD (coronary artery disease)   HOCM (hypertrophic obstructive cardiomyopathy) (HCC)   Breast cancer (HCC)   Permanent atrial fibrillation (HCC)   Chronic diastolic CHF (congestive heart failure) (HCC)   Stage 3a chronic kidney disease (CKD) (HCC)   Malnutrition of moderate degree   Preoperative cardiovascular examination  Closed right hip fracture:  Cardiology was consulted for clearance, no further cardiac work-up required at this time per cardiology.   Plan for orthopedic intervention on 03/23/2022.   Continue pain control and bowel regimen   Hypothyroidism:  Continue home Synthroid   Hypertension CAD S/P DES RCA 2015 HOCM Chronic diastolic CHF: Blood pressure well controlled.  Denies any anginal symptoms.  Home prn Lasix, Toprol.  Hold Cardizem and reassess.   History of MR s/p MitraClip March 2020 stable on previous echo   Permanent atrial fibrillation:  Rate controlled on metoprolol On amiodarone for rhythm control Eliquis on hold, on heparin drip.    Breast cancer hx triple positive adenocarcinoma of the left breast Followed by Dr. Benjamine Mola   Resolved AKI on CKD 3A Currently at I suggested she dressing them just forgot to [facility] here at her baseline creatinine  Moderate malnutrition: Nutrition  Problem: Moderate Malnutrition Etiology: chronic illness Signs/Symptoms: mild fat depletion, mild muscle depletion Interventions: MVI, Ensure Enlive (each supplement provides 350kcal and 20 grams of protein)   Body mass index is 21.69 kg/m.    DVT prophylaxis: heparin gtt Code Status:   Code Status: Full Code Family Communication: plan of care discussed with patient/daughter at bedside. Patient status is: Inpatient because of management of hip fracture Level of care: Telemetry    Dispo: The patient is from: Home            Anticipated disposition: Anticipate skilled nursing facility.    Mobility Assessment (last 72 hours)       Status is: Inpatient The patient requires at least 2 midnights for further evaluation and treatment of present condition.    Objective: Vitals:   03/12/22 1544 03/12/22 1959 03/13/22 0522 03/13/22 1135  BP: 127/64 (!) 138/91 (!) 150/92 117/79  Pulse: 77 81 75 70  Resp: '16 16 16 17  '$ Temp: 98.3 F (36.8 C) 98.5 F (36.9 C) 97.9 F (36.6 C) 97.8 F (36.6 C)  TempSrc:  Oral Oral Oral  SpO2: 99% 97% 96% 96%  Weight:      Height:        Intake/Output Summary (Last 24 hours) at 03/13/2022 1251 Last data filed at 03/13/2022 9476 Gross per 24 hour  Intake 508.47 ml  Output 500 ml  Net 8.47 ml   Filed Weights   03/11/22 1912  Weight: 64.7 kg    Exam:  General: 87 y.o. year-old female well developed well nourished in no acute distress.  Alert and oriented x3. Cardiovascular: Regular rate and rhythm with no rubs or gallops.  No thyromegaly or JVD noted.   Respiratory: Clear to auscultation with no wheezes or rales. Good inspiratory effort. Abdomen: Soft nontender nondistended with normal bowel sounds x4 quadrants. Musculoskeletal: No lower extremity edema. 2/4 pulses in all 4 extremities. Skin: No ulcerative lesions noted or rashes, Psychiatry: Mood is appropriate for condition and setting   Data Reviewed: CBC: Recent Labs  Lab  03/11/22 1430 03/12/22 0529 03/13/22 0614  WBC 7.2 7.0 5.2  NEUTROABS 5.4  --   --   HGB 13.6 12.9 13.6  HCT 42.4 40.3 41.1  MCV 103.7* 104.4* 102.8*  PLT 146* 136* 970*   Basic Metabolic Panel: Recent Labs  Lab 03/11/22 1430 03/13/22 0614  NA 138 138  K 4.6 4.1  CL 108 104  CO2 22 27  GLUCOSE 102* 94  BUN 29* 25*  CREATININE 1.38* 1.01*  CALCIUM 9.2 9.2   GFR: Estimated Creatinine Clearance: 41.1 mL/min (A) (by C-G formula based on SCr of 1.01 mg/dL (H)). Liver Function Tests: No results for input(s): "AST", "ALT", "ALKPHOS", "BILITOT", "PROT", "ALBUMIN" in the last 168 hours. No results for input(s): "LIPASE", "AMYLASE" in the last 168 hours. No results for input(s): "AMMONIA" in the last 168 hours. Coagulation Profile: Recent Labs  Lab 03/11/22 1808  INR 1.3*   Cardiac Enzymes: No results for input(s): "CKTOTAL", "CKMB", "CKMBINDEX", "TROPONINI" in the last 168 hours. BNP (last 3 results) No results for input(s): "PROBNP" in the last 8760 hours. HbA1C: No results for input(s): "HGBA1C" in the last 72 hours. CBG: No results for input(s): "GLUCAP" in the last 168 hours. Lipid Profile: No results for input(s): "CHOL", "HDL", "LDLCALC", "TRIG", "CHOLHDL", "LDLDIRECT" in the last 72 hours. Thyroid Function Tests: No results for input(s): "TSH", "T4TOTAL", "FREET4", "T3FREE", "THYROIDAB" in the last 72 hours. Anemia Panel: No results for input(s): "VITAMINB12", "FOLATE", "FERRITIN", "TIBC", "IRON", "RETICCTPCT" in the last 72 hours. Urine analysis:    Component Value Date/Time   COLORURINE YELLOW 08/24/2021 2148   APPEARANCEUR HAZY (A) 08/24/2021 2148   LABSPEC 1.018 08/24/2021 2148   PHURINE 5.0 08/24/2021 2148   GLUCOSEU NEGATIVE 08/24/2021 2148   HGBUR NEGATIVE 08/24/2021 2148   BILIRUBINUR NEGATIVE 08/24/2021 2148   BILIRUBINUR N 10/25/2016 [place] 08/24/2021 2148   PROTEINUR NEGATIVE 08/24/2021 2148   UROBILINOGEN 0.2 10/25/2016 1022    NITRITE NEGATIVE 08/24/2021 2148   LEUKOCYTESUR NEGATIVE 08/24/2021 2148   Sepsis Labs: '@LABRCNTIP'$ (procalcitonin:4,lacticidven:4)  )No results found for this or any previous visit (from the past 240 hour(s)).    Studies: ECHOCARDIOGRAM COMPLETE  Result Date: 03/13/2022    ECHOCARDIOGRAM REPORT   Patient Name:   SHAHARA HARTSFIELD Date of Exam: 03/13/2022 Medical Rec #:  [id number]       Height:       68.0 in Accession #:    [id number]      Weight:       142.6 lb Date of Birth:  [birth year]       BSA:          1.770 m Patient Age:    38 years        BP:           150/92 mmHg Patient Gender: F               HR:           75 bpm. Exam Location:  Inpatient Procedure: 2D Echo, Cardiac Doppler and Color Doppler Indications:    Preoperative evaluation  History:  Patient has prior history of Echocardiogram examinations, most                 recent 03/03/2021. CHF, CAD, Arrythmias:Atrial Fibrillation; Risk                 Factors:Hypertension.                  Mitral Valve: Mitra-Clip valve is present in the mitral                 position.  Sonographer:    Jefferey Pica Referring Phys: [id number] [place]  1. Left ventricular ejection fraction, by estimation, is 60 to 65%. The left ventricle has normal function. The left ventricle has no regional wall motion abnormalities. There is severe left ventricular hypertrophy of the basal-septal segment. Left ventricular diastolic function could not be evaluated.  2. Right ventricular systolic function is normal. The right ventricular size is normal. There is normal pulmonary artery systolic pressure.  3. Left atrial size was severely dilated.  4. The mitral valve has been repaired/replaced. Mild mitral valve regurgitation. There is a Mitra-Clip present in the mitral position.  5. The aortic valve is tricuspid. There is mild calcification of the aortic valve. There is mild thickening of the aortic valve. Aortic valve regurgitation is trivial.  Aortic valve sclerosis/calcification is present, without any evidence of aortic stenosis.  6. The inferior vena cava is normal in size with greater than 50% respiratory variability, suggesting right atrial pressure of 3 mmHg. Comparison(s): No significant change from prior study. Conclusion(s)/Recommendation(s): S/P prior mitraclip x2. No significant changes compared to prior. FINDINGS  Left Ventricle: High velocity flow seen in LVOT, but gradient not measured. Left ventricular ejection fraction, by estimation, is 60 to 65%. The left ventricle has normal function. The left ventricle has no regional wall motion abnormalities. The left ventricular internal cavity size was normal in size. There is severe left ventricular hypertrophy of the basal-septal segment. Left ventricular diastolic function could not be evaluated. Right Ventricle: The right ventricular size is normal. No increase in right ventricular wall thickness. Right ventricular systolic function is normal. There is normal pulmonary artery systolic pressure. The tricuspid regurgitant velocity is 2.40 m/s, and  with an assumed right atrial pressure of 3 mmHg, the estimated right ventricular systolic pressure is 89.2 mmHg. Left Atrium: Left atrial size was severely dilated. Right Atrium: Right atrial size was normal in size. Pericardium: Trivial pericardial effusion is present. Mitral Valve: The mitral valve has been repaired/replaced. Mild mitral valve regurgitation. There is a Mitra-Clip present in the mitral position. Tricuspid Valve: The tricuspid valve is normal in structure. Tricuspid valve regurgitation is mild . No evidence of tricuspid stenosis. Aortic Valve: The aortic valve is tricuspid. There is mild calcification of the aortic valve. There is mild thickening of the aortic valve. Aortic valve regurgitation is trivial. Aortic valve sclerosis/calcification is present, without any evidence of aortic stenosis. Aortic valve peak gradient measures 7.4  mmHg. Pulmonic Valve: The pulmonic valve was not well visualized. Pulmonic valve regurgitation is trivial. Aorta: The aortic root, ascending aorta, aortic arch and descending aorta are all structurally normal, with no evidence of dilitation or obstruction. Venous: The inferior vena cava is normal in size with greater than 50% respiratory variability, suggesting right atrial pressure of 3 mmHg. IAS/Shunts: The atrial septum is grossly normal.  LEFT VENTRICLE PLAX 2D LVIDd:         3.50 cm LVIDs:  2.10 cm LV PW:         1.40 cm LV IVS:        2.20 cm LVOT diam:     1.80 cm LV SV:         56 LV SV Index:   32 LVOT Area:     2.54 cm  RIGHT VENTRICLE          IVC RV Basal diam:  3.10 cm  IVC diam: 2.00 cm LEFT ATRIUM              Index        RIGHT ATRIUM           Index LA diam:        4.80 cm  2.71 cm/m   RA Area:     19.30 cm LA Vol (A2C):   119.0 ml 67.22 ml/m  RA Volume:   48.20 ml  27.23 ml/m LA Vol (A4C):   125.0 ml 70.61 ml/m LA Biplane Vol: 123.0 ml 69.48 ml/m  AORTIC VALVE                 PULMONIC VALVE AV Area (Vmax): 2.35 cm     PV Vmax:       0.96 m/s AV Vmax:        136.00 cm/s  PV Peak grad:  3.7 mmHg AV Peak Grad:   7.4 mmHg LVOT Vmax:      125.50 cm/s LVOT Vmean:     78.000 cm/s LVOT VTI:       0.220 m  AORTA Ao Root diam: 3.20 cm Ao Asc diam:  3.60 cm TRICUSPID VALVE TR Peak grad:   23.0 mmHg TR Vmax:        240.00 cm/s  SHUNTS Systemic VTI:  0.22 m Systemic Diam: 1.80 cm Buford Dresser MD Electronically signed by Buford Dresser MD Signature Date/Time: 03/13/2022/11:00:47 AM    Final     Scheduled Meds:  amiodarone  200 mg Oral Daily   exemestane  25 mg Oral QPC breakfast   feeding supplement  237 mL Oral BID BM   ferrous gluconate  324 mg Oral Q lunch   gabapentin  100 mg Oral QODAY   levothyroxine  137 mcg Oral QAC breakfast   metoprolol succinate  50 mg Oral QHS   multivitamin with minerals  1 tablet Oral Daily   vitamin B-12  1,000 mcg Oral Daily     Continuous Infusions:  heparin 950 Units/hr (03/13/22 0213)     LOS: 2 days     Kayleen Memos, MD [facility] Pager [phone]  If 7PM-7AM, please contact night-coverage [email or web address] Password [facility] 03/13/2022, 12:51 PM

## 2022-03-13 NOTE — Progress Notes (Signed)
Kim [name]  MRN: [id number] DOB/Age: Apr 18, 1935 87 y.o. [facility] Procedure: Procedure(s) (LRB): CONVERSION TO TOTAL HIP (Right)     Subjective: Resting comfortably at bed and "napping", no specific complaints  Vital Signs Temp:  [97.9 F (36.6 C)-98.5 F (36.9 C)] 97.9 F (36.6 C) (06/10 0522) Pulse Rate:  [75-81] 75 (06/10 0522) Resp:  [16] 16 (06/10 0522) BP: (127-150)/(64-92) 150/92 (06/10 0522) SpO2:  [96 %-99 %] 96 % (06/10 0522)  Lab Results Recent Labs    03/12/22 0529 03/13/22 0614  WBC 7.0 5.2  HGB 12.9 13.6  HCT 40.3 41.1  PLT 136* 148*   BMET Recent Labs    03/11/22 1430 03/13/22 0614  NA 138 138  K 4.6 4.1  CL 108 104  CO2 22 27  GLUCOSE 102* 94  BUN 29* 25*  CREATININE 1.38* 1.01*  CALCIUM 9.2 9.2   INR  Date Value Ref Range Status  03/11/2022 1.3 (H) 0.8 - 1.2 Final    Comment:    (NOTE) INR goal varies based on device and disease states. Performed at [facility], [place] [address] [place], [place] [address]      Exam Right LE NVI         Plan Continue activity modifications to pain tolerance through weekend Plan for surgery Monday  [facility] PA-C  03/13/2022, 9:51 AM Contact # [phone]

## 2022-03-13 NOTE — Progress Notes (Signed)
Brief cardiology progress note:  See preoperative recommendations from Dr. Gardiner Rhyme yesterday. I personally read/reviewed echo, no significant changes from prior. Based on consult recommendations, no further cardiac workup required at this time.  Please contact us with any questions or concerns.  Buford Dresser, MD, PhD, [facility] at [facility] at [facility] [address] [place], [place] [address] [phone]

## 2022-03-13 NOTE — Progress Notes (Signed)
[place] for IV heparin  Indication: atrial fibrillation  Allergies  Allergen Reactions   Penicillins Anaphylaxis and Swelling    FACIAL SWELLING PATIENT HAS HAD A PCN REACTION WITH IMMEDIATE RASH, FACIAL/TONGUE/THROAT SWELLING, SOB, OR LIGHTHEADEDNESS WITH HYPOTENSION:  #  #  YES  #  #  Has patient had a PCN reaction causing severe rash involving mucus membranes or skin necrosis: No Has patient had a PCN reaction that required hospitalization: No Has patient had a PCN reaction occurring within the last 10 years: No   Propoxyphene N-Acetaminophen Swelling    tongue swelling   Levaquin [Levofloxacin] Hives   Repatha [Evolocumab] Other (See Comments)    Myalgias, fatigue, some itching and burning on her feet,  Arthralgia (Joint Pain)   Statins Other (See Comments)    Joint pain   Cortisone Rash   Cymbalta [Duloxetine Hcl] Itching and Rash   Myrbetriq [Mirabegron] Rash    Rash on face   Pseudoephedrine Other (See Comments)    Facial flushing    Patient Measurements: Height: '5\' 8"'$  (172.7 cm) Weight: 64.7 kg (142 lb 10.2 oz) IBW/kg (Calculated) : 63.9 Heparin Dosing Weight: TBW  Vital Signs: Temp: 97.9 F (36.6 C) (06/10 0522) Temp Source: Oral (06/10 0522) BP: 150/92 (06/10 0522) Pulse Rate: 75 (06/10 0522)  Labs: Recent Labs    03/11/22 1430 03/11/22 1430 03/11/22 1808 03/12/22 0529 03/12/22 1349 03/13/22 0614  HGB 13.6  --   --  12.9  --  13.6  HCT 42.4  --   --  40.3  --  41.1  PLT 146*  --   --  136*  --  148*  APTT  --    < > 36 83* 97* 76*  LABPROT  --   --  16.0*  --   --   --   INR  --   --  1.3*  --   --   --   HEPARINUNFRC  --   --  >1.10* >1.10*  --  1.08*  CREATININE 1.38*  --   --   --   --  1.01*   < > = values in this interval not displayed.     Estimated Creatinine Clearance: 41.1 mL/min (A) (by C-G formula based on SCr of 1.01 mg/dL (H)).   Medications:  PTA Apixaban '5mg'$  PO BID-last dose reported as  03/10/2022 at 1800  Assessment: Kim [name] on Apixaban PTA for afib admitted for right femur fracture s/p fall. Apixaban held on admission in case surgery needed. Pharmacy consulted for IV heparin dosing. CBC: Hgb WNL, Pltc slightly low at 136K.   03/13/22 Heparin level remains falsely elevated d/t recent DOAC Most recent aPTT lower (likely diurnal variation) but remains WNL on heparin at 950 units/hr No complications of therapy noted  Goal of Therapy:  Heparin level 0.3-0.7 units/ml aPTT 66-102 seconds Monitor platelets by anticoagulation protocol: Yes   Plan:  Continue heparin infusion at 950 units/hr  Daily CBC, heparin level, aPTT; add'l aPTT as needed while heparin level remains falsely elevated Monitor closely for s/sx of bleeding F/u ortho plans -surgery likely on Monday, 6/12  Reuel Boom, PharmD, BCPS [phone] 03/13/2022, 8:22 AM

## 2022-03-14 DIAGNOSIS — S72001A Fracture of unspecified part of neck of right femur, initial encounter for closed fracture: Secondary | ICD-10-CM | POA: Diagnosis not present

## 2022-03-14 LAB — CBC
HCT: 40.3 % (ref 36.0–46.0)
Hemoglobin: 13.1 g/dL (ref 12.0–15.0)
MCH: 33.2 pg (ref 26.0–34.0)
MCHC: 32.5 g/dL (ref 30.0–36.0)
MCV: 102 fL — ABNORMAL HIGH (ref 80.0–100.0)
Platelets: 155 10*3/uL (ref 150–400)
RBC: 3.95 MIL/uL (ref 3.87–5.11)
RDW: 12.6 % (ref 11.5–15.5)
WBC: 5.8 10*3/uL (ref 4.0–10.5)
nRBC: 0 % (ref 0.0–0.2)

## 2022-03-14 LAB — COMPREHENSIVE METABOLIC PANEL
ALT: 14 U/L (ref 0–44)
AST: 14 U/L — ABNORMAL LOW (ref 15–41)
Albumin: 3.2 g/dL — ABNORMAL LOW (ref 3.5–5.0)
Alkaline Phosphatase: 63 U/L (ref 38–126)
Anion gap: 7 (ref 5–15)
BUN: 30 mg/dL — ABNORMAL HIGH (ref 8–23)
CO2: 25 mmol/L (ref 22–32)
Calcium: 9.2 mg/dL (ref 8.9–10.3)
Chloride: 104 mmol/L (ref 98–111)
Creatinine, Ser: 1.04 mg/dL — ABNORMAL HIGH (ref 0.44–1.00)
GFR, Estimated: 53 mL/min — ABNORMAL LOW (ref >60)
Glucose, Bld: 96 mg/dL (ref 70–99)
Potassium: 4.5 mmol/L (ref 3.5–5.1)
Sodium: 136 mmol/L (ref 135–145)
Total Bilirubin: 0.9 mg/dL (ref 0.3–1.2)
Total Protein: 6.2 g/dL — ABNORMAL LOW (ref 6.5–8.1)

## 2022-03-14 LAB — APTT
aPTT: 60 seconds — ABNORMAL HIGH (ref 24–36)
aPTT: 63 seconds — ABNORMAL HIGH (ref 24–36)

## 2022-03-14 LAB — HEPARIN LEVEL (UNFRACTIONATED)
Heparin Unfractionated: 0.59 IU/mL (ref 0.30–0.70)
Heparin Unfractionated: 0.61 IU/mL (ref 0.30–0.70)

## 2022-03-14 MED ORDER — DILTIAZEM HCL ER COATED BEADS 240 MG PO CP24
240.0000 mg | ORAL_CAPSULE | Freq: Every day | ORAL | Status: DC
  Administered 2022-03-14 – 2022-03-18 (×5): 240 mg via ORAL
  Filled 2022-03-14 (×5): qty 1

## 2022-03-14 NOTE — Progress Notes (Signed)
Noting that pt has new order for NPO. Pt has just finished breakfast, where she ate 100%. Pt will now be NPO.

## 2022-03-14 NOTE — Progress Notes (Signed)
[place] for IV heparin  Indication: atrial fibrillation  Allergies  Allergen Reactions   Penicillins Anaphylaxis and Swelling    FACIAL SWELLING PATIENT HAS HAD A PCN REACTION WITH IMMEDIATE RASH, FACIAL/TONGUE/THROAT SWELLING, SOB, OR LIGHTHEADEDNESS WITH HYPOTENSION:  #  #  YES  #  #  Has patient had a PCN reaction causing severe rash involving mucus membranes or skin necrosis: No Has patient had a PCN reaction that required hospitalization: No Has patient had a PCN reaction occurring within the last 10 years: No   Propoxyphene N-Acetaminophen Swelling    tongue swelling   Levaquin [Levofloxacin] Hives   Repatha [Evolocumab] Other (See Comments)    Myalgias, fatigue, some itching and burning on her feet,  Arthralgia (Joint Pain)   Statins Other (See Comments)    Joint pain   Cortisone Rash   Cymbalta [Duloxetine Hcl] Itching and Rash   Myrbetriq [Mirabegron] Rash    Rash on face   Pseudoephedrine Other (See Comments)    Facial flushing    Patient Measurements: Height: '5\' 8"'$  (172.7 cm) Weight: 64.7 kg (142 lb 10.2 oz) IBW/kg (Calculated) : 63.9 Heparin Dosing Weight: TBW  Vital Signs: Temp: 97.6 F (36.4 C) (06/11 0315) Temp Source: Oral (06/11 0315) BP: 146/90 (06/11 0315) Pulse Rate: 83 (06/11 0315)  Labs: Recent Labs    03/11/22 1430 03/11/22 1430 03/11/22 1808 03/12/22 0529 03/12/22 1349 03/13/22 0614 03/14/22 0618  HGB 13.6  --   --  12.9  --  13.6 13.1  HCT 42.4  --   --  40.3  --  41.1 40.3  PLT 146*  --   --  136*  --  148* 155  APTT  --    < > 36 83* 97* 76* 60*  LABPROT  --   --  16.0*  --   --   --   --   INR  --   --  1.3*  --   --   --   --   HEPARINUNFRC  --    < > >1.10* >1.10*  --  1.08* 0.59  CREATININE 1.38*  --   --   --   --  1.01*  --    < > = values in this interval not displayed.     Estimated Creatinine Clearance: 41.1 mL/min (A) (by C-G formula based on SCr of 1.01 mg/dL  (H)).   Medications:  PTA Apixaban '5mg'$  PO BID-last dose reported as 03/10/2022 at 1800  Assessment: [name] on Apixaban PTA for afib admitted for right femur fracture s/p fall. Apixaban held on admission in case surgery needed. Pharmacy consulted for IV heparin dosing. CBC: Hgb WNL, Pltc slightly low at 136K.   03/14/22 CBC stable WNL Mild AKI on admission now resolved Today's aPTT now slightly low on 950 units/hr - no infusion issues per RN Heparin level now within therapeutic range, but given continued decline along with subtherapeutic aPTT, most likely this is still falsely elevated from recent DOAC No bleeding per RN  Goal of Therapy:  Heparin level 0.3-0.7 units/ml aPTT 66-102 seconds Monitor platelets by anticoagulation protocol: Yes   Plan:  Increase heparin infusion to 1100 units/hr  Recheck aPTT/HL in 8 hr Daily CBC, heparin level, aPTT; add'l aPTT as needed while heparin level remains falsely elevated Monitor closely for s/sx of bleeding F/u ortho plans -surgery likely on Monday, 6/12  Reuel Boom, PharmD, BCPS [phone] 03/14/2022, 7:37 AM

## 2022-03-14 NOTE — TOC Progression Note (Signed)
Transition of Care [facility]) - Progression Note    Patient Details  Name: NIKOLETTA VARMA MRN: [id number] Date of Birth: 02-Oct-1935  Transition of Care [facility]) CM/SW Contact  Ross Ludwig, [place] Phone Number: 03/14/2022, 1:02 PM  Clinical Narrative:     CSW spoke with patient's [name] and discussed with her the process of trying to get patient into SNF for rehab.  Patient's daughter said she currently lives at [facility] and they are looking at other options after rehab.  Per patient's daughter, they would prefer to return back to [facility] once patient is ready for SNF placement.  Per chart review, patient will be having surgery tomorrow, and then will have to be seen by therapy afterwards.  CSW explained the process of getting insurance authorization and how it pays for stay at [facility].  CSW updated patient's daughter that [facility] team can not start process for SNF placement until after PT evaluates after surgery.  Patient's daughter asked if there is a possibility of patient returning to ALF for [place] therapy, CSW informed her that would be up to the facility to decide.  TOC to continue to follow patient's progress throughout discharge planning.        Expected Discharge Plan and Services                                                 Social Determinants of Health (SDOH) Interventions    Readmission Risk Interventions     No data to display

## 2022-03-14 NOTE — Progress Notes (Signed)
In reading Orthopedic note of this morning from PA, the pt will be NPO after midnight tonight.  Will noted this order and alert pt.

## 2022-03-14 NOTE — Anesthesia Preprocedure Evaluation (Signed)
Anesthesia Evaluation  Patient identified by MRN, date of birth, ID band Patient awake    Reviewed: Allergy & Precautions, NPO status , Patient's Chart, lab work & pertinent test results  Airway Mallampati: III  TM Distance: <3 FB Neck ROM: Full    Dental no notable dental hx. (+) Upper Dentures, Lower Dentures, Dental Advisory Given   Pulmonary neg pulmonary ROS,    Pulmonary exam normal breath sounds clear to auscultation       Cardiovascular hypertension, + CAD and + Past MI  Normal cardiovascular exam+ dysrhythmias Atrial Fibrillation  Rhythm:Regular Rate:Normal  03/12/2022 Echo 1. Left ventricular ejection fraction, by estimation, is 60 to 65%. The  left ventricle has normal function. The left ventricle has no regional  wall motion abnormalities. There is severe left ventricular hypertrophy of  the basal-septal segment. Left  ventricular diastolic function could not be evaluated.  2. Right ventricular systolic function is normal. The right ventricular  size is normal. There is normal pulmonary artery systolic pressure.  3. Left atrial size was severely dilated.  4. The mitral valve has been repaired/replaced. Mild mitral valve  regurgitation. There is a Mitra-Clip present in the mitral position.  5. The aortic valve is tricuspid. There is mild calcification of the  aortic valve. There is mild thickening of the aortic valve. Aortic valve  regurgitation is trivial. Aortic valve sclerosis/calcification is present,  without any evidence of aortic  stenosis.  6. The inferior vena cava is normal in size with greater than 50%  respiratory variability, suggesting right atrial pressure of 3 mmHg.    Neuro/Psych negative neurological ROS     GI/Hepatic negative GI ROS, Neg liver ROS,   Endo/Other  Hypothyroidism   Renal/GU Lab Results      Component                Value               Date                      CREATININE                1.04 (H)            03/14/2022              K                        4.5                 03/14/2022                     Musculoskeletal  (+) Arthritis ,   Abdominal   Peds  Hematology Lab Results      Component                Value               Date                           HGB                      13.1                03/14/2022                HCT  40.3                03/14/2022                  PLT                      155                 03/14/2022              Anesthesia Other Findings All: see list   L Breast CA  Reproductive/Obstetrics                            Anesthesia Physical Anesthesia Plan  ASA: 3  Anesthesia Plan: General   Post-op Pain Management: Regional block* and Ofirmev IV (intra-op)*   Induction: Intravenous  PONV Risk Score and Plan: 2 and Treatment may vary due to age or medical condition  Airway Management Planned: Oral ETT  Additional Equipment: None  Intra-op Plan:   Post-operative Plan: Extubation in OR  Informed Consent: I have reviewed the patients History and Physical, chart, labs and discussed the procedure including the risks, benefits and alternatives for the proposed anesthesia with the patient or authorized representative who has indicated his/her understanding and acceptance.     Dental advisory given  Plan Discussed with: CRNA and Anesthesiologist  Anesthesia Plan Comments:       Anesthesia Quick Evaluation

## 2022-03-14 NOTE — Progress Notes (Signed)
ANTICOAGULATION CONSULT - Brief Note   Pharmacy Consult for IV heparin  Indication: atrial fibrillation  Patient Measurements: Height: '5\' 8"'$  (172.7 cm) Weight: 64.7 kg (142 lb 10.2 oz) IBW/kg (Calculated) : 63.9 Heparin Dosing Weight: TBW  Labs: Recent Labs    03/11/22 1808 03/11/22 1808 03/12/22 0529 03/12/22 1349 03/13/22 0614 03/14/22 0618 03/14/22 1628  HGB  --    < > 12.9  --  13.6 13.1  --   HCT  --   --  40.3  --  41.1 40.3  --   PLT  --   --  136*  --  148* 155  --   APTT 36  --  83*   < > 76* 60* 63*  LABPROT 16.0*  --   --   --   --   --   --   INR 1.3*  --   --   --   --   --   --   HEPARINUNFRC >1.10*  --  >1.10*  --  1.08* 0.59 0.61  CREATININE  --   --   --   --  1.01* 1.04*  --    < > = values in this interval not displayed.     Estimated Creatinine Clearance: 39.9 mL/min (A) (by C-G formula based on SCr of 1.04 mg/dL (H)).   Medications: Infusions   heparin 1,100 Units/hr (03/14/22 0817)   Assessment: [name] on Apixaban PTA for afib admitted for right femur fracture s/p fall. Apixaban held on admission in case surgery needed (LD 6/7 at 1800). Pharmacy consulted for IV heparin dosing.   Heparin level 0.61, remains therapeutic on heparin 1100 units/hr APTT 63, just below therapeutic range; given stability of heparin level in therapeutic range, will monitor/adjust using Heparin levels only.   No bleeding, complications, interruptions reported.    Goal of Therapy:  Heparin level 0.3-0.7 units/ml aPTT 66-102 seconds Monitor platelets by anticoagulation protocol: Yes   Plan:  Continue heparin infusion 1100 units/hr  Daily CBC, heparin level Monitor closely for s/sx of bleeding F/u ortho plans -surgery likely on Monday, 6/12  Gretta Arab PharmD, BCPS Clinical Pharmacist [facility] main pharmacy [phone] 03/14/2022 5:40 PM

## 2022-03-14 NOTE — Progress Notes (Signed)
PROGRESS NOTE  Kim [name] EHU:[id number] DOB: December 04, 1934 DOA: 03/11/2022 PCP: Sueanne Margarita, DO  HPI/Recap of past 24 hours: 87 y.o. female with medical history significant of permanent a fib on amiodarone and Eliquis, CAD, HOCM, HFpEF, hypothyroidism. Presenting with right hip pain after a mechanical fall found to have right femur fracture.  Cardiology consulted for cardiac clearance.  Plan for orthopedics intervention on Monday, 03/15/2022.  Currently on heparin drip for CVA prevention.  03/14/22: No new complaints.  She denies having any pain with no movement.  Assessment/Plan: Principal Problem:   Closed right hip fracture (HCC) Active Problems:   Hypothyroidism   Hypertension   CAD (coronary artery disease)   HOCM (hypertrophic obstructive cardiomyopathy) (HCC)   Breast cancer (HCC)   Permanent atrial fibrillation (HCC)   Chronic diastolic CHF (congestive heart failure) (HCC)   Stage 3a chronic kidney disease (CKD) (HCC)   Malnutrition of moderate degree   Preoperative cardiovascular examination  Closed right hip fracture:  Cardiology was consulted for clearance, no further cardiac work-up required at this time per cardiology.   Plan for orthopedic intervention on 03/15/2022.   Continue pain control and bowel regimen   Hypothyroidism:  Continue home Synthroid   Hypertension CAD S/P DES RCA 2015 HOCM Chronic diastolic CHF: Blood pressure well controlled.  Denies any anginal symptoms.  Continue home cardiac medications   History of MR s/p MitraClip March 2020 Stable on previous echo   Permanent atrial fibrillation:  Rate controlled on metoprolol On amiodarone for rhythm control Eliquis on hold, on heparin drip.    Breast cancer hx triple positive adenocarcinoma of the left breast Followed by Dr. Benjamine Mola   Resolved AKI on CKD 3A Baseline creatinine appears to be 1.0 with GFR 53. She presented with creatinine of 1.38 and GFR of 38. Continue to avoid  nephrotoxic agents  Moderate malnutrition: Nutrition Problem: Moderate Malnutrition Etiology: chronic illness Signs/Symptoms: mild fat depletion, mild muscle depletion Interventions: MVI, Ensure Enlive (each supplement provides 350kcal and 20 grams of protein)   Body mass index is 21.69 kg/m.    DVT prophylaxis: heparin gtt Code Status:   Code Status: Full Code Family Communication: Plan of care discussed with the patient. Patient status is: Inpatient because of management of hip fracture Level of care: Telemetry    Dispo: The patient is from: Home            Anticipated disposition: Anticipate skilled nursing facility.    Mobility Assessment (last 72 hours)       Status is: Inpatient The patient requires at least 2 midnights for further evaluation and treatment of present condition.    Objective: Vitals:   03/13/22 1928 03/14/22 0028 03/14/22 0315 03/14/22 1248  BP: 122/83 119/84 (!) 146/90 132/80  Pulse: 84 83 83 78  Resp: '17 17 18 18  '$ Temp: 98.2 F (36.8 C) 97.8 F (36.6 C) 97.6 F (36.4 C) 97.8 F (36.6 C)  TempSrc: Oral Oral Oral Oral  SpO2: 97% 98% 96% 95%  Weight:      Height:        Intake/Output Summary (Last 24 hours) at 03/14/2022 1343 Last data filed at 03/14/2022 0935 Gross per 24 hour  Intake 360 ml  Output 1001 ml  Net -641 ml   Filed Weights   03/11/22 1912  Weight: 64.7 kg    Exam:  General: 87 y.o. year-old female well-developed well-nourished in no acute stress.  She is alert and oriented x3.   Cardiovascular: Regular rate  and rhythm no rubs or gallops. Respiratory: Clear to auscultation no wheezes or rales. Abdomen: Soft nontender normal bowel sounds. Musculoskeletal: No lower extremity edema bilaterally. Skin: No ulcerative lesions noted. Psychiatry: Mood is appropriate for condition.   Data Reviewed: CBC: Recent Labs  Lab 03/11/22 1430 03/12/22 0529 03/13/22 0614 03/14/22 0618  WBC 7.2 7.0 5.2 5.8  NEUTROABS 5.4  --    --   --   HGB 13.6 12.9 13.6 13.1  HCT 42.4 40.3 41.1 40.3  MCV 103.7* 104.4* 102.8* 102.0*  PLT 146* 136* 148* 102   Basic Metabolic Panel: Recent Labs  Lab 03/11/22 1430 03/13/22 0614 03/14/22 0618  NA 138 138 136  K 4.6 4.1 4.5  CL 108 104 104  CO2 '22 27 25  '$ GLUCOSE 102* 94 96  BUN 29* 25* 30*  CREATININE 1.38* 1.01* 1.04*  CALCIUM 9.2 9.2 9.2   GFR: Estimated Creatinine Clearance: 39.9 mL/min (A) (by C-G formula based on SCr of 1.04 mg/dL (H)). Liver Function Tests: Recent Labs  Lab 03/14/22 0618  AST 14*  ALT 14  ALKPHOS 63  BILITOT 0.9  PROT 6.2*  ALBUMIN 3.2*   No results for input(s): "LIPASE", "AMYLASE" in the last 168 hours. No results for input(s): "AMMONIA" in the last 168 hours. Coagulation Profile: Recent Labs  Lab 03/11/22 1808  INR 1.3*   Cardiac Enzymes: No results for input(s): "CKTOTAL", "CKMB", "CKMBINDEX", "TROPONINI" in the last 168 hours. BNP (last 3 results) No results for input(s): "PROBNP" in the last 8760 hours. HbA1C: No results for input(s): "HGBA1C" in the last 72 hours. CBG: No results for input(s): "GLUCAP" in the last 168 hours. Lipid Profile: No results for input(s): "CHOL", "HDL", "LDLCALC", "TRIG", "CHOLHDL", "LDLDIRECT" in the last 72 hours. Thyroid Function Tests: No results for input(s): "TSH", "T4TOTAL", "FREET4", "T3FREE", "THYROIDAB" in the last 72 hours. Anemia Panel: No results for input(s): "VITAMINB12", "FOLATE", "FERRITIN", "TIBC", "IRON", "RETICCTPCT" in the last 72 hours. Urine analysis:    Component Value Date/Time   COLORURINE YELLOW 08/24/2021 2148   APPEARANCEUR HAZY (A) 08/24/2021 2148   LABSPEC 1.018 08/24/2021 2148   PHURINE 5.0 08/24/2021 2148   GLUCOSEU NEGATIVE 08/24/2021 2148   HGBUR NEGATIVE 08/24/2021 2148   BILIRUBINUR NEGATIVE 08/24/2021 2148   BILIRUBINUR N 10/25/2016 [place] 08/24/2021 2148   PROTEINUR NEGATIVE 08/24/2021 2148   UROBILINOGEN 0.2 10/25/2016 1022    NITRITE NEGATIVE 08/24/2021 2148   LEUKOCYTESUR NEGATIVE 08/24/2021 2148   Sepsis Labs: '@LABRCNTIP'$ (procalcitonin:4,lacticidven:4)  )No results found for this or any previous visit (from the past 240 hour(s)).    Studies: No results found.  Scheduled Meds:  amiodarone  200 mg Oral Daily   diltiazem  240 mg Oral Daily   exemestane  25 mg Oral QPC breakfast   feeding supplement  237 mL Oral BID BM   ferrous gluconate  324 mg Oral Q lunch   gabapentin  100 mg Oral QODAY   levothyroxine  137 mcg Oral QAC breakfast   metoprolol succinate  50 mg Oral QHS   multivitamin with minerals  1 tablet Oral Daily   vitamin B-12  1,000 mcg Oral Daily    Continuous Infusions:  heparin 1,100 Units/hr (03/14/22 0817)     LOS: 3 days     Kayleen Memos, MD [facility] Pager [phone]  If 7PM-7AM, please contact night-coverage [email or web address] Password [facility] 03/14/2022, 1:43 PM

## 2022-03-14 NOTE — Progress Notes (Addendum)
Patient ID: Kim [name], female   DOB: November 26, 1934, 87 y.o.   MRN: [id number]  Awaiting surgery tomorrow with Dr. Alvan Dame - conversion to total hip. Order placed for NPO after MN. Anticoagulation will need to be held accordingly.

## 2022-03-15 ENCOUNTER — Encounter (HOSPITAL_COMMUNITY)
Admission: EM | Disposition: A | Discharge status: Skilled Nursing Facility | Payer: Self-pay | Source: Home / Self Care | Attending: Internal Medicine

## 2022-03-15 ENCOUNTER — Inpatient Hospital Stay (HOSPITAL_COMMUNITY): Payer: Medicare PPO | Admitting: Anesthesiology

## 2022-03-15 ENCOUNTER — Inpatient Hospital Stay (HOSPITAL_COMMUNITY): Payer: Medicare PPO

## 2022-03-15 DIAGNOSIS — I2510 Atherosclerotic heart disease of native coronary artery without angina pectoris: Secondary | ICD-10-CM

## 2022-03-15 DIAGNOSIS — S72141P Displaced intertrochanteric fracture of right femur, subsequent encounter for closed fracture with malunion: Secondary | ICD-10-CM

## 2022-03-15 DIAGNOSIS — T84194A Other mechanical complication of internal fixation device of right femur, initial encounter: Secondary | ICD-10-CM

## 2022-03-15 DIAGNOSIS — S72001A Fracture of unspecified part of neck of right femur, initial encounter for closed fracture: Secondary | ICD-10-CM | POA: Diagnosis not present

## 2022-03-15 DIAGNOSIS — I252 Old myocardial infarction: Secondary | ICD-10-CM

## 2022-03-15 DIAGNOSIS — Z96641 Presence of right artificial hip joint: Secondary | ICD-10-CM

## 2022-03-15 HISTORY — PX: CONVERSION TO TOTAL HIP: SHX5784

## 2022-03-15 LAB — CBC
HCT: 40.9 % (ref 36.0–46.0)
Hemoglobin: 13.1 g/dL (ref 12.0–15.0)
MCH: 33 pg (ref 26.0–34.0)
MCHC: 32 g/dL (ref 30.0–36.0)
MCV: 103 fL — ABNORMAL HIGH (ref 80.0–100.0)
Platelets: 153 10*3/uL (ref 150–400)
RBC: 3.97 MIL/uL (ref 3.87–5.11)
RDW: 12.7 % (ref 11.5–15.5)
WBC: 5.6 10*3/uL (ref 4.0–10.5)
nRBC: 0 % (ref 0.0–0.2)

## 2022-03-15 LAB — COMPREHENSIVE METABOLIC PANEL
ALT: 12 U/L (ref 0–44)
AST: 13 U/L — ABNORMAL LOW (ref 15–41)
Albumin: 3.3 g/dL — ABNORMAL LOW (ref 3.5–5.0)
Alkaline Phosphatase: 66 U/L (ref 38–126)
Anion gap: 8 (ref 5–15)
BUN: 27 mg/dL — ABNORMAL HIGH (ref 8–23)
CO2: 27 mmol/L (ref 22–32)
Calcium: 9.4 mg/dL (ref 8.9–10.3)
Chloride: 103 mmol/L (ref 98–111)
Creatinine, Ser: 1.03 mg/dL — ABNORMAL HIGH (ref 0.44–1.00)
GFR, Estimated: 53 mL/min — ABNORMAL LOW (ref >60)
Glucose, Bld: 95 mg/dL (ref 70–99)
Potassium: 4.3 mmol/L (ref 3.5–5.1)
Sodium: 138 mmol/L (ref 135–145)
Total Bilirubin: 1.1 mg/dL (ref 0.3–1.2)
Total Protein: 6.4 g/dL — ABNORMAL LOW (ref 6.5–8.1)

## 2022-03-15 LAB — TYPE AND SCREEN
ABO/RH(D): A POS
Antibody Screen: NEGATIVE

## 2022-03-15 LAB — HEPARIN LEVEL (UNFRACTIONATED): Heparin Unfractionated: 0.61 IU/mL (ref 0.30–0.70)

## 2022-03-15 SURGERY — CONVERSION, PREVIOUS HIP SURGERY, TO TOTAL HIP ARTHROPLASTY
Anesthesia: General | Site: Hip | Laterality: Right

## 2022-03-15 MED ORDER — POLYETHYLENE GLYCOL 3350 17 G PO PACK
17.0000 g | PACK | Freq: Every day | ORAL | Status: DC | PRN
  Administered 2022-03-18: 17 g via ORAL
  Filled 2022-03-15: qty 1

## 2022-03-15 MED ORDER — BISACODYL 10 MG RE SUPP: 10.0000 mg | Freq: Every day | RECTAL | Status: DC | PRN

## 2022-03-15 MED ORDER — ONDANSETRON HCL 4 MG/2ML IJ SOLN
INTRAMUSCULAR | Status: AC
  Filled 2022-03-15: qty 2

## 2022-03-15 MED ORDER — PROPOFOL 1000 MG/100ML IV EMUL
INTRAVENOUS | Status: AC
Start: 2022-03-15 — End: ?
  Filled 2022-03-15: qty 100

## 2022-03-15 MED ORDER — PHENYLEPHRINE 80 MCG/ML (10ML) SYRINGE FOR IV PUSH (FOR BLOOD PRESSURE SUPPORT)
PREFILLED_SYRINGE | INTRAVENOUS | Status: DC | PRN
  Administered 2022-03-15 (×3): 80 ug via INTRAVENOUS
  Administered 2022-03-15: 160 ug via INTRAVENOUS
  Administered 2022-03-15: 80 ug via INTRAVENOUS
  Administered 2022-03-15 (×2): 160 ug via INTRAVENOUS
  Administered 2022-03-15: 80 ug via INTRAVENOUS

## 2022-03-15 MED ORDER — SODIUM CHLORIDE 0.9 % IV SOLN: INTRAVENOUS | Status: DC

## 2022-03-15 MED ORDER — EPHEDRINE 5 MG/ML INJ
INTRAVENOUS | Status: AC
  Filled 2022-03-15: qty 5

## 2022-03-15 MED ORDER — FENTANYL CITRATE PF 50 MCG/ML IJ SOSY
25.0000 ug | PREFILLED_SYRINGE | INTRAMUSCULAR | Status: DC | PRN
  Administered 2022-03-15: 25 ug via INTRAVENOUS
  Administered 2022-03-15: 50 ug via INTRAVENOUS

## 2022-03-15 MED ORDER — ONDANSETRON HCL 4 MG/2ML IJ SOLN: 4.0000 mg | Freq: Once | INTRAMUSCULAR | Status: DC | PRN

## 2022-03-15 MED ORDER — METOCLOPRAMIDE HCL 5 MG PO TABS: 5.0000 mg | ORAL_TABLET | Freq: Three times a day (TID) | ORAL | Status: DC | PRN

## 2022-03-15 MED ORDER — DEXAMETHASONE SODIUM PHOSPHATE 10 MG/ML IJ SOLN
INTRAMUSCULAR | Status: DC | PRN
  Administered 2022-03-15: 8 mg via INTRAVENOUS

## 2022-03-15 MED ORDER — DEXAMETHASONE SODIUM PHOSPHATE 10 MG/ML IJ SOLN
INTRAMUSCULAR | Status: AC
  Filled 2022-03-15: qty 1

## 2022-03-15 MED ORDER — ROCURONIUM BROMIDE 10 MG/ML (PF) SYRINGE
PREFILLED_SYRINGE | INTRAVENOUS | Status: AC
  Filled 2022-03-15: qty 10

## 2022-03-15 MED ORDER — GENTAMICIN IN SALINE 1.2-0.9 MG/ML-% IV SOLN
60.0000 mg | INTRAVENOUS | Status: DC
  Filled 2022-03-15: qty 50

## 2022-03-15 MED ORDER — ACETAMINOPHEN 325 MG PO TABS: 325.0000 mg | ORAL_TABLET | Freq: Four times a day (QID) | ORAL | Status: DC | PRN

## 2022-03-15 MED ORDER — METOCLOPRAMIDE HCL 5 MG/ML IJ SOLN: 5.0000 mg | Freq: Three times a day (TID) | INTRAMUSCULAR | Status: DC | PRN

## 2022-03-15 MED ORDER — FENTANYL CITRATE PF 50 MCG/ML IJ SOSY
PREFILLED_SYRINGE | INTRAMUSCULAR | Status: AC
  Administered 2022-03-15: 25 ug via INTRAVENOUS
  Filled 2022-03-15: qty 2

## 2022-03-15 MED ORDER — PHENYLEPHRINE HCL (PRESSORS) 10 MG/ML IV SOLN
INTRAVENOUS | Status: AC
  Filled 2022-03-15: qty 1

## 2022-03-15 MED ORDER — ONDANSETRON HCL 4 MG/2ML IJ SOLN: 4.0000 mg | Freq: Four times a day (QID) | INTRAMUSCULAR | Status: DC | PRN

## 2022-03-15 MED ORDER — PROPOFOL 10 MG/ML IV BOLUS
INTRAVENOUS | Status: AC
  Filled 2022-03-15: qty 20

## 2022-03-15 MED ORDER — METHOCARBAMOL 1000 MG/10ML IJ SOLN: 500.0000 mg | Freq: Four times a day (QID) | INTRAVENOUS | Status: DC | PRN

## 2022-03-15 MED ORDER — VANCOMYCIN HCL IN DEXTROSE 1-5 GM/200ML-% IV SOLN
1000.0000 mg | Freq: Two times a day (BID) | INTRAVENOUS | Status: AC
  Administered 2022-03-15 – 2022-03-16 (×2): 1000 mg via INTRAVENOUS
  Filled 2022-03-15: qty 200

## 2022-03-15 MED ORDER — MORPHINE SULFATE (PF) 2 MG/ML IV SOLN: 0.5000 mg | INTRAVENOUS | Status: DC | PRN

## 2022-03-15 MED ORDER — PHENYLEPHRINE HCL-NACL 20-0.9 MG/250ML-% IV SOLN
INTRAVENOUS | Status: DC | PRN
  Administered 2022-03-15: 50 ug/min via INTRAVENOUS

## 2022-03-15 MED ORDER — GENTAMICIN SULFATE 40 MG/ML IJ SOLN
65.0000 mg | INTRAMUSCULAR | Status: AC
  Administered 2022-03-15: 70 mg via INTRAVENOUS
  Filled 2022-03-15: qty 1.75

## 2022-03-15 MED ORDER — LIDOCAINE 2% (20 MG/ML) 5 ML SYRINGE
INTRAMUSCULAR | Status: DC | PRN
  Administered 2022-03-15: 80 mg via INTRAVENOUS

## 2022-03-15 MED ORDER — FENTANYL CITRATE (PF) 100 MCG/2ML IJ SOLN
INTRAMUSCULAR | Status: DC | PRN
Start: 2022-03-15 — End: 2022-03-15
  Administered 2022-03-15: 25 ug via INTRAVENOUS
  Administered 2022-03-15: 50 ug via INTRAVENOUS

## 2022-03-15 MED ORDER — FENTANYL CITRATE (PF) 100 MCG/2ML IJ SOLN
INTRAMUSCULAR | Status: AC
  Filled 2022-03-15: qty 2

## 2022-03-15 MED ORDER — LACTATED RINGERS IV SOLN: INTRAVENOUS | Status: DC

## 2022-03-15 MED ORDER — HYDROCODONE-ACETAMINOPHEN 5-325 MG PO TABS
1.0000 | ORAL_TABLET | ORAL | Status: DC | PRN
  Administered 2022-03-15 – 2022-03-18 (×6): 1 via ORAL
  Administered 2022-03-18 (×2): 2 via ORAL
  Filled 2022-03-15: qty 1
  Filled 2022-03-15: qty 2
  Filled 2022-03-15: qty 1
  Filled 2022-03-15: qty 2
  Filled 2022-03-15 (×4): qty 1

## 2022-03-15 MED ORDER — PHENYLEPHRINE 80 MCG/ML (10ML) SYRINGE FOR IV PUSH (FOR BLOOD PRESSURE SUPPORT)
PREFILLED_SYRINGE | INTRAVENOUS | Status: AC
  Filled 2022-03-15: qty 10

## 2022-03-15 MED ORDER — DEXAMETHASONE SODIUM PHOSPHATE 10 MG/ML IJ SOLN
10.0000 mg | Freq: Once | INTRAMUSCULAR | Status: AC
  Administered 2022-03-16: 10 mg via INTRAVENOUS
  Filled 2022-03-15: qty 1

## 2022-03-15 MED ORDER — EPHEDRINE SULFATE-NACL 50-0.9 MG/10ML-% IV SOSY
PREFILLED_SYRINGE | INTRAVENOUS | Status: DC | PRN
  Administered 2022-03-15: 5 mg via INTRAVENOUS
  Administered 2022-03-15: 10 mg via INTRAVENOUS

## 2022-03-15 MED ORDER — PROPOFOL 10 MG/ML IV BOLUS
INTRAVENOUS | Status: DC | PRN
  Administered 2022-03-15: 90 mg via INTRAVENOUS

## 2022-03-15 MED ORDER — POVIDONE-IODINE 10 % EX SWAB
2.0000 "application " | Freq: Once | CUTANEOUS | Status: AC
  Administered 2022-03-15: 2 via TOPICAL

## 2022-03-15 MED ORDER — PHENYLEPHRINE HCL (PRESSORS) 10 MG/ML IV SOLN
INTRAVENOUS | Status: AC
Start: 2022-03-15 — End: ?
  Filled 2022-03-15: qty 1

## 2022-03-15 MED ORDER — ROCURONIUM BROMIDE 10 MG/ML (PF) SYRINGE
PREFILLED_SYRINGE | INTRAVENOUS | Status: DC | PRN
  Administered 2022-03-15: 50 mg via INTRAVENOUS

## 2022-03-15 MED ORDER — FENTANYL CITRATE PF 50 MCG/ML IJ SOSY
PREFILLED_SYRINGE | INTRAMUSCULAR | Status: AC
  Administered 2022-03-15: 50 ug via INTRAVENOUS
  Filled 2022-03-15: qty 1

## 2022-03-15 MED ORDER — ONDANSETRON HCL 4 MG PO TABS: 4.0000 mg | ORAL_TABLET | Freq: Four times a day (QID) | ORAL | Status: DC | PRN

## 2022-03-15 MED ORDER — ACETAMINOPHEN 10 MG/ML IV SOLN: 1000.0000 mg | Freq: Once | INTRAVENOUS | Status: DC | PRN

## 2022-03-15 MED ORDER — VANCOMYCIN HCL IN DEXTROSE 1-5 GM/200ML-% IV SOLN
1000.0000 mg | INTRAVENOUS | Status: AC
  Administered 2022-03-15: 1000 mg via INTRAVENOUS
  Filled 2022-03-15: qty 200

## 2022-03-15 MED ORDER — LIDOCAINE HCL (PF) 2 % IJ SOLN
INTRAMUSCULAR | Status: AC
  Filled 2022-03-15: qty 5

## 2022-03-15 MED ORDER — CHLORHEXIDINE GLUCONATE 4 % EX LIQD
60.0000 mL | Freq: Once | CUTANEOUS | Status: DC
  Filled 2022-03-15: qty 60

## 2022-03-15 MED ORDER — ASPIRIN 81 MG PO CHEW
81.0000 mg | CHEWABLE_TABLET | Freq: Two times a day (BID) | ORAL | Status: DC
  Administered 2022-03-15: 81 mg via ORAL
  Filled 2022-03-15: qty 1

## 2022-03-15 MED ORDER — DIPHENHYDRAMINE HCL 12.5 MG/5ML PO ELIX: 12.5000 mg | ORAL_SOLUTION | ORAL | Status: DC | PRN

## 2022-03-15 MED ORDER — MENTHOL 3 MG MT LOZG: 1.0000 | LOZENGE | OROMUCOSAL | Status: DC | PRN

## 2022-03-15 MED ORDER — STERILE WATER FOR IRRIGATION IR SOLN
Status: DC | PRN
  Administered 2022-03-15: 2000 mL

## 2022-03-15 MED ORDER — ORAL CARE MOUTH RINSE: 15.0000 mL | Freq: Once | OROMUCOSAL | Status: AC

## 2022-03-15 MED ORDER — 0.9 % SODIUM CHLORIDE (POUR BTL) OPTIME
TOPICAL | Status: DC | PRN
  Administered 2022-03-15: 1000 mL

## 2022-03-15 MED ORDER — TRANEXAMIC ACID-NACL 1000-0.7 MG/100ML-% IV SOLN
1000.0000 mg | Freq: Once | INTRAVENOUS | Status: AC
  Administered 2022-03-15: 1000 mg via INTRAVENOUS
  Filled 2022-03-15: qty 100

## 2022-03-15 MED ORDER — SUGAMMADEX SODIUM 200 MG/2ML IV SOLN
INTRAVENOUS | Status: DC | PRN
  Administered 2022-03-15: 130 mg via INTRAVENOUS

## 2022-03-15 MED ORDER — METHOCARBAMOL 500 MG PO TABS
500.0000 mg | ORAL_TABLET | Freq: Four times a day (QID) | ORAL | Status: DC | PRN
  Administered 2022-03-18: 500 mg via ORAL
  Filled 2022-03-15: qty 1

## 2022-03-15 MED ORDER — PHENOL 1.4 % MT LIQD: 1.0000 | OROMUCOSAL | Status: DC | PRN

## 2022-03-15 MED ORDER — CHLORHEXIDINE GLUCONATE 0.12 % MT SOLN
15.0000 mL | Freq: Once | OROMUCOSAL | Status: AC
Start: 2022-03-15 — End: 2022-03-15
  Administered 2022-03-15: 15 mL via OROMUCOSAL

## 2022-03-15 MED ORDER — ONDANSETRON HCL 4 MG/2ML IJ SOLN
INTRAMUSCULAR | Status: DC | PRN
  Administered 2022-03-15: 4 mg via INTRAVENOUS

## 2022-03-15 MED ORDER — DOCUSATE SODIUM 100 MG PO CAPS
100.0000 mg | ORAL_CAPSULE | Freq: Two times a day (BID) | ORAL | Status: DC
  Administered 2022-03-15 – 2022-03-18 (×6): 100 mg via ORAL
  Filled 2022-03-15 (×6): qty 1

## 2022-03-15 MED ORDER — TRANEXAMIC ACID-NACL 1000-0.7 MG/100ML-% IV SOLN
1000.0000 mg | INTRAVENOUS | Status: AC
  Administered 2022-03-15: 1000 mg via INTRAVENOUS
  Filled 2022-03-15: qty 100

## 2022-03-15 SURGICAL SUPPLY — 54 items
ADH SKN CLS APL DERMABOND .7 (GAUZE/BANDAGES/DRESSINGS) ×1
AML 16.5 LRG 12/14 OFFSET (Hips) ×2 IMPLANT
ARTICULEZE HEAD 36 12 (Hips) ×2 IMPLANT
BAG COUNTER SPONGE SURGICOUNT (BAG) IMPLANT
BAG SPEC THK2 15X12 ZIP CLS (MISCELLANEOUS) ×1
BAG SPNG CNTER NS LX DISP (BAG)
BAG ZIPLOCK 12X15 (MISCELLANEOUS) ×3 IMPLANT
BLADE SAW SGTL 18X1.27X75 (BLADE) ×3 IMPLANT
COVER SURGICAL LIGHT HANDLE (MISCELLANEOUS) ×3 IMPLANT
CUP ACETBLR 52 OD PINNACLE (Hips) ×1 IMPLANT
DERMABOND ADVANCED (GAUZE/BANDAGES/DRESSINGS) ×1
DERMABOND ADVANCED .7 DNX12 (GAUZE/BANDAGES/DRESSINGS) ×2 IMPLANT
DRAPE INCISE IOBAN 85X60 (DRAPES) ×3 IMPLANT
DRAPE ORTHO SPLIT 77X108 STRL (DRAPES) ×4
DRAPE POUCH INSTRU U-SHP 10X18 (DRAPES) ×3 IMPLANT
DRAPE SURG ORHT 6 SPLT 77X108 (DRAPES) ×4 IMPLANT
DRAPE U-SHAPE 47X51 STRL (DRAPES) ×5 IMPLANT
DRESSING AQUACEL AG SP 3.5X10 (GAUZE/BANDAGES/DRESSINGS) IMPLANT
DRSG AQUACEL AG 3.5X4 (GAUZE/BANDAGES/DRESSINGS) ×1 IMPLANT
DRSG AQUACEL AG ADV 3.5X14 (GAUZE/BANDAGES/DRESSINGS) ×1 IMPLANT
DRSG AQUACEL AG SP 3.5X10 (GAUZE/BANDAGES/DRESSINGS)
DURAPREP 26ML APPLICATOR (WOUND CARE) ×3 IMPLANT
ELECT BLADE TIP CTD 4 INCH (ELECTRODE) ×1 IMPLANT
ELECT REM PT RETURN 15FT ADLT (MISCELLANEOUS) ×3 IMPLANT
GLOVE BIOGEL PI IND STRL 7.5 (GLOVE) ×6 IMPLANT
GLOVE BIOGEL PI IND STRL 8.5 (GLOVE) ×2 IMPLANT
GLOVE BIOGEL PI INDICATOR 7.5 (GLOVE) ×3
GLOVE BIOGEL PI INDICATOR 8.5 (GLOVE)
GLOVE ECLIPSE 8.0 STRL XLNG CF (GLOVE) ×3 IMPLANT
GLOVE INDICATOR 6.5 STRL GRN (GLOVE) ×3 IMPLANT
GOWN STRL REUS W/ TWL LRG LVL3 (GOWN DISPOSABLE) ×4 IMPLANT
GOWN STRL REUS W/TWL LRG LVL3 (GOWN DISPOSABLE) ×4
HEAD ARTICULEZE 36 12 (Hips) IMPLANT
HIP AML 16.5 LRG 12/14 OFFSET (Hips) IMPLANT
KIT BASIN OR (CUSTOM PROCEDURE TRAY) ×3 IMPLANT
KIT TURNOVER KIT A (KITS) ×1 IMPLANT
LINER NEUTRAL 52X36MM PLUS 4 (Liner) ×1 IMPLANT
MANIFOLD NEPTUNE II (INSTRUMENTS) ×3 IMPLANT
NS IRRIG 1000ML POUR BTL (IV SOLUTION) ×3 IMPLANT
PACK TOTAL JOINT (CUSTOM PROCEDURE TRAY) ×3 IMPLANT
PROTECTOR NERVE ULNAR (MISCELLANEOUS) ×3 IMPLANT
SCREW 6.5MMX25MM (Screw) ×1 IMPLANT
SCREW 6.5MMX35MM (Screw) ×1 IMPLANT
SUCTION FRAZIER HANDLE 12FR (TUBING) ×2
SUCTION TUBE FRAZIER 12FR DISP (TUBING) ×2 IMPLANT
SUT STRATAFIX 0 PDS 27 VIOLET (SUTURE) ×2
SUT VIC AB 1 CT1 36 (SUTURE) ×9 IMPLANT
SUT VIC AB 2-0 CT1 27 (SUTURE) ×6
SUT VIC AB 2-0 CT1 TAPERPNT 27 (SUTURE) ×6 IMPLANT
SUTURE STRATFX 0 PDS 27 VIOLET (SUTURE) ×2 IMPLANT
TOWEL OR 17X26 10 PK STRL BLUE (TOWEL DISPOSABLE) ×6 IMPLANT
TRAY FOLEY MTR SLVR 16FR STAT (SET/KITS/TRAYS/PACK) ×3 IMPLANT
TUBE SUCTION HIGH CAP CLEAR NV (SUCTIONS) ×3 IMPLANT
WATER STERILE IRR 1000ML POUR (IV SOLUTION) ×6 IMPLANT

## 2022-03-15 NOTE — Progress Notes (Signed)
Patient ID: Kim [name], female   DOB: Jan 26, 1935, 87 y.o.   MRN: [id number] Subjective: Day of Surgery Procedure(s) (LRB): CONVERSION TO TOTAL HIP (Right)    Patient reports pain as mild.  She has been comfortable here in the hospital. Her daughter was with her this am  Objective:   VITALS:   Vitals:   03/14/22 1919 03/15/22 0330  BP: 114/70 126/74  Pulse: 79 73  Resp: 17 16  Temp: 98.2 F (36.8 C) 97.9 F (36.6 C)  SpO2: 98% 96%    Exam: NVI ROM not stressed  LABS Recent Labs    03/13/22 0614 03/14/22 0618 03/15/22 0509  HGB 13.6 13.1 13.1  HCT 41.1 40.3 40.9  WBC 5.2 5.8 5.6  PLT 148* 155 153    Recent Labs    03/13/22 0614 03/14/22 0618 03/15/22 0509  NA 138 136 138  K 4.1 4.5 4.3  BUN 25* 30* 27*  CREATININE 1.01* 1.04* 1.03*  GLUCOSE 94 96 95    No results for input(s): "LABPT", "INR" in the last 72 hours.   Assessment/Plan: Day of Surgery Procedure(s) (LRB): CONVERSION TO TOTAL HIP (Right)   NPO D/C'd heparin gtt for surgery today Will resume pre-op anticoagulation post operatively  Surgery scheduled for 3 today Post orders to be placed based on OR findings Xrays of hip reviewed with her daughter Risks of infection DVT, dislocation further hardware related issues and leg length issues reviewed Consent ordered

## 2022-03-15 NOTE — Progress Notes (Signed)
ANTICOAGULATION CONSULT NOTE - Follow Up Consult  Pharmacy Consult for Heparin Indication: Hx of Atrial fibrillation (home Eliquis on hold)  Allergies  Allergen Reactions   Penicillins Anaphylaxis and Swelling    FACIAL SWELLING PATIENT HAS HAD A PCN REACTION WITH IMMEDIATE RASH, FACIAL/TONGUE/THROAT SWELLING, SOB, OR LIGHTHEADEDNESS WITH HYPOTENSION:  #  #  YES  #  #  Has patient had a PCN reaction causing severe rash involving mucus membranes or skin necrosis: No Has patient had a PCN reaction that required hospitalization: No Has patient had a PCN reaction occurring within the last 10 years: No   Propoxyphene N-Acetaminophen Swelling    tongue swelling   Levaquin [Levofloxacin] Hives   Repatha [Evolocumab] Other (See Comments)    Myalgias, fatigue, some itching and burning on her feet,  Arthralgia (Joint Pain)   Statins Other (See Comments)    Joint pain   Cortisone Rash   Cymbalta [Duloxetine Hcl] Itching and Rash   Myrbetriq [Mirabegron] Rash    Rash on face   Pseudoephedrine Other (See Comments)    Facial flushing    Patient Measurements: Height: '5\' 8"'$  (172.7 cm) Weight: 64.7 kg (142 lb 10.2 oz) IBW/kg (Calculated) : 63.9 Heparin Dosing Weight: TBW   Vital Signs: Temp: 97.9 F (36.6 C) (06/12 0330) Temp Source: Oral (06/12 0330) BP: 126/74 (06/12 0330) Pulse Rate: 73 (06/12 0330)  Labs: Recent Labs    03/13/22 0614 03/14/22 0618 03/14/22 1628 03/15/22 0509  HGB 13.6 13.1  --  13.1  HCT 41.1 40.3  --  40.9  PLT 148* 155  --  153  APTT 76* 60* 63*  --   HEPARINUNFRC 1.08* 0.59 0.61 0.61  CREATININE 1.01* 1.04*  --  1.03*    Estimated Creatinine Clearance: 40.3 mL/min (A) (by C-G formula based on SCr of 1.03 mg/dL (H)).   Medications:  - Eliquis 5 mg tabs BID PTA (last dose on 03/10/22)   Assessment: Pt is 60 YOF w/ hx of afib on Eliquis PTA and hx of femur fracture s/p IM nail in 2022. Presented on 03/11/22 w/ complaints of pain s/p fall. X-ray showed  displacement of screw from previous ORIF femur fracture. Apixaban held upon admit d/t possible need for surgery and patient was started on heparin drip.   Today, 03/15/22, - Scheduled for THA on 03/15/22 at 3:00 PM - Heparin level 0.61, this morning therapeutic. - Heparin  D/c by Dr. Alvan Dame this AM with RN turning off drip at 8:25 AM - Hgb and platelets stable - No signs of bleeding -  Goal of Therapy:  Heparin level 0.3-0.7 units/ml Monitor platelets by anticoagulation protocol: Yes   Plan:  -  heparin infusion d/c at 8:25 AM - Please advise on timing of resumption of AC post THA procedure.  Barnet Pall 03/15/2022,7:13 AM

## 2022-03-15 NOTE — H&P (View-Only) (Signed)
Patient ID: Kim [name], female   DOB: 04-28-1935, 87 y.o.   MRN: [id number] Subjective: Day of Surgery Procedure(s) (LRB): CONVERSION TO TOTAL HIP (Right)    Patient reports pain as mild.  She has been comfortable here in the hospital. Her daughter was with her this am  Objective:   VITALS:   Vitals:   03/14/22 1919 03/15/22 0330  BP: 114/70 126/74  Pulse: 79 73  Resp: 17 16  Temp: 98.2 F (36.8 C) 97.9 F (36.6 C)  SpO2: 98% 96%    Exam: NVI ROM not stressed  LABS Recent Labs    03/13/22 0614 03/14/22 0618 03/15/22 0509  HGB 13.6 13.1 13.1  HCT 41.1 40.3 40.9  WBC 5.2 5.8 5.6  PLT 148* 155 153    Recent Labs    03/13/22 0614 03/14/22 0618 03/15/22 0509  NA 138 136 138  K 4.1 4.5 4.3  BUN 25* 30* 27*  CREATININE 1.01* 1.04* 1.03*  GLUCOSE 94 96 95    No results for input(s): "LABPT", "INR" in the last 72 hours.   Assessment/Plan: Day of Surgery Procedure(s) (LRB): CONVERSION TO TOTAL HIP (Right)   NPO D/C'd heparin gtt for surgery today Will resume pre-op anticoagulation post operatively  Surgery scheduled for 3 today Post orders to be placed based on OR findings Xrays of hip reviewed with her daughter Risks of infection DVT, dislocation further hardware related issues and leg length issues reviewed Consent ordered

## 2022-03-15 NOTE — Transfer of Care (Signed)
Immediate Anesthesia Transfer of Care Note  Patient: Kim [name]  Procedure(s) Performed: CONVERSION TO TOTAL HIP (Right: Hip)  Patient Location: PACU  Anesthesia Type:General  Level of Consciousness: awake, drowsy and patient cooperative  Airway & Oxygen Therapy: Patient Spontanous Breathing and Patient connected to face mask oxygen  Post-op Assessment: Report given to RN and Post -op Vital signs reviewed and stable  Post vital signs: Reviewed and stable  Last Vitals:  Vitals Value Taken Time  BP 147/74 03/15/22 1751  Temp    Pulse 88 03/15/22 1753  Resp 17 03/15/22 1753  SpO2 100 % 03/15/22 1753  Vitals shown include unvalidated device data.  Last Pain:  Vitals:   03/15/22 1330  TempSrc: Oral  PainSc:       Patients Stated Pain Goal: 1 (15/40/08 6761)  Complications: No notable events documented.

## 2022-03-15 NOTE — Anesthesia Procedure Notes (Signed)
Procedure Name: Intubation Date/Time: 03/15/2022 3:27 PM  Performed by: Niel Hummer, CRNAPre-anesthesia Checklist: Patient identified, Emergency Drugs available, Suction available and Patient being monitored Patient Re-evaluated:Patient Re-evaluated prior to induction Oxygen Delivery Method: Circle system utilized Preoxygenation: Pre-oxygenation with 100% oxygen Induction Type: IV induction Ventilation: Mask ventilation without difficulty Laryngoscope Size: Mac and 4 Grade View: Grade I Tube type: Oral Tube size: 7.0 mm Number of attempts: 1 Airway Equipment and Method: Stylet Placement Confirmation: ETT inserted through vocal cords under direct vision, positive ETCO2 and breath sounds checked- equal and bilateral Secured at: 22 cm Tube secured with: Tape Dental Injury: Teeth and Oropharynx as per pre-operative assessment

## 2022-03-15 NOTE — Anesthesia Postprocedure Evaluation (Signed)
Anesthesia Post Note  Patient: Kim [name]  Procedure(s) Performed: CONVERSION TO TOTAL HIP (Right: Hip)     Patient location during evaluation: PACU Anesthesia Type: General Level of consciousness: awake and alert Pain management: pain level controlled Vital Signs Assessment: post-procedure vital signs reviewed and stable Respiratory status: spontaneous breathing, nonlabored ventilation, respiratory function stable and patient connected to nasal cannula oxygen Cardiovascular status: blood pressure returned to baseline and stable Postop Assessment: no apparent nausea or vomiting Anesthetic complications: no   No notable events documented.  Last Vitals:  Vitals:   03/15/22 1830 03/15/22 1855  BP: 110/65 (!) 98/58  Pulse: 98 86  Resp: 12 16  Temp:  36.9 C  SpO2: 100% 94%    Last Pain:  Vitals:   03/15/22 1830  TempSrc:   PainSc: [place]

## 2022-03-15 NOTE — Plan of Care (Signed)

## 2022-03-15 NOTE — Interval H&P Note (Signed)
History and Physical Interval Note:  03/15/2022 3:09 PM  Kim [name]  has presented today for surgery, with the diagnosis of Failed right hip, convert to Right total hip arthroplasty.  The various methods of treatment have been discussed with the patient and family. After consideration of risks, benefits and other options for treatment, the patient has consented to  Procedure(s) with comments: CONVERSION TO TOTAL HIP (Right) - Dr. Alvan Dame requests to start at 3:00pm as a surgical intervention.  The patient's history has been reviewed, patient examined, no change in status, stable for surgery.  I have reviewed the patient's chart and labs.  Questions were answered to the patient's satisfaction.     Mauri Pole

## 2022-03-15 NOTE — Brief Op Note (Signed)
03/15/2022  5:51 PM  PATIENT:  Kim [name]  87 y.o. female  PRE-OPERATIVE DIAGNOSIS:  Failed previous right hip surgery status post open reduction internal fixation of intertrochanteric hip fracture with malunion and penetration of lag screw into her acetabulum  POST-OPERATIVE DIAGNOSIS:  Failed previous right hip surgery status post open reduction internal fixation of intertrochanteric hip fracture with malunion and penetration of lag screw into her acetabulum  PROCEDURE:  Procedure(s) with comments: CONVERSION TO TOTAL HIP (Right)  SURGEON:  Surgeon(s) and Role:    Paralee Cancel, MD - Primary  PHYSICIAN ASSISTANT: Costella Hatcher, PA-C  ANESTHESIA:   general  EBL:  450 mL   BLOOD ADMINISTERED:none  DRAINS: none   LOCAL MEDICATIONS USED:  NONE  SPECIMEN:  No Specimen  DISPOSITION OF SPECIMEN:  N/A  COUNTS:  YES  TOURNIQUET:  * No tourniquets in log *  DICTATION: .Other Dictation: Dictation Number [id number]  PLAN OF CARE: Admit to inpatient   PATIENT DISPOSITION:  PACU - hemodynamically stable.   Delay start of Pharmacological VTE agent (>24hrs) due to surgical blood loss or risk of bleeding: no

## 2022-03-15 NOTE — Progress Notes (Signed)
Patient arrived back to room 1501 from PACU. VS obtained and recorded. T 98.4 BP 98/58 MAP 72 HR 86 RR 16 SpO2 94% RA. AMION page to Dr. Nevada Crane. Report given to PM RN.

## 2022-03-15 NOTE — Progress Notes (Signed)
PROGRESS NOTE  CELA NEWCOM GYK:[id number] DOB: [birth year] DOA: 03/11/2022 PCP: Sueanne Margarita, DO  HPI/Recap of past 24 hours: 87 y.o. female with medical history significant of permanent a fib on amiodarone and Eliquis, CAD, HOCM, HFpEF, hypothyroidism. Presenting with right hip pain after a mechanical fall found to have right femur fracture.  Cardiology consulted for cardiac clearance.  Plan for orthopedics intervention on Monday, 03/15/2022.  Currently on heparin drip for CVA prevention.  03/15/22: No new complaints this morning.  She feels ready for her surgery.  Assessment/Plan: Principal Problem:   Closed right hip fracture (HCC) Active Problems:   Hypothyroidism   Hypertension   CAD (coronary artery disease)   HOCM (hypertrophic obstructive cardiomyopathy) (HCC)   Breast cancer (HCC)   Permanent atrial fibrillation (HCC)   Chronic diastolic CHF (congestive heart failure) (HCC)   Stage 3a chronic kidney disease (CKD) (HCC)   Malnutrition of moderate degree   Preoperative cardiovascular examination  Closed right hip fracture:  Cardiology was consulted for clearance, no further cardiac work-up required at this time per cardiology.   Plan for orthopedic intervention on 03/15/2022.   Continue pain control and bowel regimen   Hypothyroidism:  Continue home Synthroid   Hypertension CAD S/P DES RCA 2015 HOCM Chronic diastolic CHF: Blood pressure well controlled.  Denies any anginal symptoms.  Continue home cardiac medications   History of MR s/p MitraClip March 2020 Stable on previous echo   Permanent atrial fibrillation:  Rate controlled on metoprolol On amiodarone for rhythm control Eliquis on hold, on heparin drip.    Breast cancer hx triple positive adenocarcinoma of the left breast Followed by Dr. Benjamine Mola   Resolved AKI on CKD 3A Baseline creatinine appears to be 1.0 with GFR 53. She presented with creatinine of 1.38 and GFR of 38. Continue to avoid  nephrotoxic agents  Moderate malnutrition: Nutrition Problem: Moderate Malnutrition Etiology: chronic illness Signs/Symptoms: mild fat depletion, mild muscle depletion Interventions: MVI, Ensure Enlive (each supplement provides 350kcal and 20 grams of protein)   Body mass index is 21.69 kg/m.    DVT prophylaxis: heparin gtt Code Status:   Code Status: Full Code Family Communication: Plan of care discussed with the patient. Patient status is: Inpatient because of management of hip fracture Level of care: Telemetry    Dispo: The patient is from: Home            Anticipated disposition: Anticipate skilled nursing facility.    Mobility Assessment (last 72 hours)       Status is: Inpatient The patient requires at least 2 midnights for further evaluation and treatment of present condition.    Objective: Vitals:   03/14/22 1248 03/14/22 1919 03/15/22 0330 03/15/22 1045  BP: 132/80 114/70 126/74 115/81  Pulse: 78 79 73 79  Resp: '18 17 16   '$ Temp: 97.8 F (36.6 C) 98.2 F (36.8 C) 97.9 F (36.6 C)   TempSrc: Oral Oral Oral   SpO2: 95% 98% 96% 96%  Weight:      Height:        Intake/Output Summary (Last 24 hours) at 03/15/2022 1151 Last data filed at 03/15/2022 0330 Gross per 24 hour  Intake 320 ml  Output 1400 ml  Net -1080 ml   Filed Weights   03/11/22 1912  Weight: 64.7 kg    Exam: No significant changes from prior exam.  General: 87 y.o. year-old female well-developed well-nourished in no acute stress.  She is alert and oriented x3.   Cardiovascular:  Regular rate and rhythm no rubs or gallops. Respiratory: Clear to auscultation no wheezes or rales. Abdomen: Soft nontender normal bowel sounds. Musculoskeletal: No lower extremity edema bilaterally. Skin: No ulcerative lesions noted. Psychiatry: Mood is appropriate for condition.   Data Reviewed: CBC: Recent Labs  Lab 03/11/22 1430 03/12/22 0529 03/13/22 0614 03/14/22 0618 03/15/22 0509  WBC 7.2 7.0  5.2 5.8 5.6  NEUTROABS 5.4  --   --   --   --   HGB 13.6 12.9 13.6 13.1 13.1  HCT 42.4 40.3 41.1 40.3 40.9  MCV 103.7* 104.4* 102.8* 102.0* 103.0*  PLT 146* 136* 148* 155 237   Basic Metabolic Panel: Recent Labs  Lab 03/11/22 1430 03/13/22 0614 03/14/22 0618 03/15/22 0509  NA 138 138 136 138  K 4.6 4.1 4.5 4.3  CL 108 104 104 103  CO2 '22 27 25 27  '$ GLUCOSE 102* 94 96 95  BUN 29* 25* 30* 27*  CREATININE 1.38* 1.01* 1.04* 1.03*  CALCIUM 9.2 9.2 9.2 9.4   GFR: Estimated Creatinine Clearance: 40.3 mL/min (A) (by C-G formula based on SCr of 1.03 mg/dL (H)). Liver Function Tests: Recent Labs  Lab 03/14/22 0618 03/15/22 0509  AST 14* 13*  ALT 14 12  ALKPHOS 63 66  BILITOT 0.9 1.1  PROT 6.2* 6.4*  ALBUMIN 3.2* 3.3*   No results for input(s): "LIPASE", "AMYLASE" in the last 168 hours. No results for input(s): "AMMONIA" in the last 168 hours. Coagulation Profile: Recent Labs  Lab 03/11/22 1808  INR 1.3*   Cardiac Enzymes: No results for input(s): "CKTOTAL", "CKMB", "CKMBINDEX", "TROPONINI" in the last 168 hours. BNP (last 3 results) No results for input(s): "PROBNP" in the last 8760 hours. HbA1C: No results for input(s): "HGBA1C" in the last 72 hours. CBG: No results for input(s): "GLUCAP" in the last 168 hours. Lipid Profile: No results for input(s): "CHOL", "HDL", "LDLCALC", "TRIG", "CHOLHDL", "LDLDIRECT" in the last 72 hours. Thyroid Function Tests: No results for input(s): "TSH", "T4TOTAL", "FREET4", "T3FREE", "THYROIDAB" in the last 72 hours. Anemia Panel: No results for input(s): "VITAMINB12", "FOLATE", "FERRITIN", "TIBC", "IRON", "RETICCTPCT" in the last 72 hours. Urine analysis:    Component Value Date/Time   COLORURINE YELLOW 08/24/2021 2148   APPEARANCEUR HAZY (A) 08/24/2021 2148   LABSPEC 1.018 08/24/2021 2148   PHURINE 5.0 08/24/2021 2148   GLUCOSEU NEGATIVE 08/24/2021 2148   HGBUR NEGATIVE 08/24/2021 2148   BILIRUBINUR NEGATIVE 08/24/2021 2148    BILIRUBINUR N 10/25/2016 [place] 08/24/2021 2148   PROTEINUR NEGATIVE 08/24/2021 2148   UROBILINOGEN 0.2 10/25/2016 1022   NITRITE NEGATIVE 08/24/2021 2148   LEUKOCYTESUR NEGATIVE 08/24/2021 2148   Sepsis Labs: '@LABRCNTIP'$ (procalcitonin:4,lacticidven:4)  )No results found for this or any previous visit (from the past 240 hour(s)).    Studies: No results found.  Scheduled Meds:  amiodarone  200 mg Oral Daily   chlorhexidine  60 mL Topical Once   diltiazem  240 mg Oral Daily   exemestane  25 mg Oral QPC breakfast   feeding supplement  237 mL Oral BID BM   ferrous gluconate  324 mg Oral Q lunch   gabapentin  100 mg Oral QODAY   levothyroxine  137 mcg Oral QAC breakfast   metoprolol succinate  50 mg Oral QHS   multivitamin with minerals  1 tablet Oral Daily   povidone-iodine  2 application  Topical Once   vitamin B-12  1,000 mcg Oral Daily    Continuous Infusions:  sodium chloride 50 mL/hr  at 03/15/22 1050   tranexamic acid     vancomycin       LOS: 4 days     Kayleen Memos, MD [facility] Pager [phone]  If 7PM-7AM, please contact night-coverage [email or web address] Password [facility] 03/15/2022, 11:51 AM

## 2022-03-15 NOTE — Care Management Important Message (Signed)
Important Message  Patient Details IM Letter given to the Patient. Name: Kim [name] MRN: [id number] Date of Birth: [birth year]   Medicare Important Message Given:  Yes     Kerin Salen 03/15/2022, 10:32 AM

## 2022-03-16 ENCOUNTER — Encounter (HOSPITAL_COMMUNITY): Payer: Self-pay | Admitting: Orthopedic Surgery

## 2022-03-16 DIAGNOSIS — S72001A Fracture of unspecified part of neck of right femur, initial encounter for closed fracture: Secondary | ICD-10-CM | POA: Diagnosis not present

## 2022-03-16 LAB — CBC
HCT: 33.6 % — ABNORMAL LOW (ref 36.0–46.0)
Hemoglobin: 11.1 g/dL — ABNORMAL LOW (ref 12.0–15.0)
MCH: 33.9 pg (ref 26.0–34.0)
MCHC: 33 g/dL (ref 30.0–36.0)
MCV: 102.8 fL — ABNORMAL HIGH (ref 80.0–100.0)
Platelets: 140 10*3/uL — ABNORMAL LOW (ref 150–400)
RBC: 3.27 MIL/uL — ABNORMAL LOW (ref 3.87–5.11)
RDW: 12.7 % (ref 11.5–15.5)
WBC: 8.8 10*3/uL (ref 4.0–10.5)
nRBC: 0 % (ref 0.0–0.2)

## 2022-03-16 LAB — BASIC METABOLIC PANEL
Anion gap: 8 (ref 5–15)
BUN: 31 mg/dL — ABNORMAL HIGH (ref 8–23)
CO2: 24 mmol/L (ref 22–32)
Calcium: 8.6 mg/dL — ABNORMAL LOW (ref 8.9–10.3)
Chloride: 100 mmol/L (ref 98–111)
Creatinine, Ser: 1.26 mg/dL — ABNORMAL HIGH (ref 0.44–1.00)
GFR, Estimated: 42 mL/min — ABNORMAL LOW (ref >60)
Glucose, Bld: 170 mg/dL — ABNORMAL HIGH (ref 70–99)
Potassium: 5 mmol/L (ref 3.5–5.1)
Sodium: 132 mmol/L — ABNORMAL LOW (ref 135–145)

## 2022-03-16 LAB — GLUCOSE, CAPILLARY: Glucose-Capillary: 160 mg/dL — ABNORMAL HIGH (ref 70–99)

## 2022-03-16 MED ORDER — APIXABAN 5 MG PO TABS
5.0000 mg | ORAL_TABLET | Freq: Two times a day (BID) | ORAL | Status: DC
  Administered 2022-03-16 – 2022-03-18 (×5): 5 mg via ORAL
  Filled 2022-03-16 (×5): qty 1

## 2022-03-16 MED ORDER — MELATONIN 5 MG PO TABS
5.0000 mg | ORAL_TABLET | Freq: Every day | ORAL | Status: DC
  Administered 2022-03-16 – 2022-03-17 (×2): 5 mg via ORAL
  Filled 2022-03-16 (×2): qty 1

## 2022-03-16 NOTE — TOC Progression Note (Signed)
Transition of Care [facility]) - Progression Note    Patient Details  Name: Kim [name] MRN: [id number] Date of Birth: 09-09-1935  Transition of Care [facility]) CM/SW [place], LCSW Phone Number: 03/16/2022, 4:15 PM  Clinical Narrative:    PT evaluated and recommended SNF placement for this pt. FL-2 has been completed and pt has been referred out for SNF placement. Currently awaiting bed offers.         Expected Discharge Plan and Services                                                 Social Determinants of Health (SDOH) Interventions    Readmission Risk Interventions     No data to display

## 2022-03-16 NOTE — Progress Notes (Signed)
Physical Therapy Treatment Patient Details Name: Kim [name] MRN: [id number] DOB: 24-Aug-1935 Today's Date: 03/16/2022   History of Present Illness Pt admitted from home s/p fall 03/08/22 and now unable to walk and c/o R hip and knee pain.  Pt with hx of CAD, CHF, HOH, HOCM, MI, a-fib, back surgery, R TKR and R hip fx with IM nailing. Pt now s/p conversion to Rt THA posterior approach on 03/15/22.    PT Comments    Patient seen for follow up visit to return to bed safely and complete Rt LE exercises. Mod-Max +2 assist required for stand pivot transfer with bil HHA. Pt unable to sequence steps with bil LE due to weakness and pain. EOS reposition in supine and instructed on exercises for ROM to Rt LE. Continue to recommend SNF rehab for pt.    Recommendations for follow up therapy are one component of a multi-disciplinary discharge planning process, led by the attending physician.  Recommendations may be updated based on patient status, additional functional criteria and insurance authorization.  Follow Up Recommendations  Skilled nursing-short term rehab (<3 hours/day)     Assistance Recommended at Discharge Frequent or constant Supervision/Assistance  Patient can return home with the following Two people to help with walking and/or transfers;Two people to help with bathing/dressing/bathroom;Assistance with cooking/housework;Direct supervision/assist for medications management;Direct supervision/assist for financial management;Assist for transportation;Help with stairs or ramp for entrance   Equipment Recommendations  None recommended by PT    Recommendations for Other Services       Precautions / Restrictions Precautions Precautions: Fall Precaution Comments: No posterior hip precautions per MD Restrictions Weight Bearing Restrictions: No RLE Weight Bearing: Weight bearing as tolerated     Mobility  Bed Mobility Overal bed mobility: Needs Assistance Bed Mobility: Sit to  Supine     Supine to sit: Mod assist, HOB elevated, +2 for safety/equipment, +2 for physical assistance Sit to supine: Max assist, Total assist, +2 for physical assistance, +2 for safety/equipment   General bed mobility comments: Max +2 assist to raise bil LE's onto bed and control lowering trunk to supine. 2+ to boost in bed.    Transfers Overall transfer level: Needs assistance Equipment used: 2 person hand held assist Transfers: Sit to/from Stand, Bed to chair/wheelchair/BSC Sit to Stand: +2 safety/equipment, From elevated surface, +2 physical assistance, Max assist, Mod assist Stand pivot transfers: +2 physical assistance, +2 safety/equipment, Mod assist         General transfer comment: Mod +2 to rise from recliner with [place]. pt pushing from armrest to power up and holding therapist for support after. Max+2 to pivot to EOB from recliner.    Ambulation/Gait Ambulation/Gait assistance: Max assist, +2 safety/equipment, +2 physical assistance Gait Distance (Feet): 4 Feet Assistive device: Rolling walker (2 wheels) Gait Pattern/deviations: Step-to pattern, Decreased step length - right, Decreased step length - left, Shuffle, Narrow base of support, Trunk flexed, Antalgic, Knees buckling, Knee flexed in stance - left, Knee flexed in stance - right       General Gait Details: pt attempting forward steps with RW to go to bathroom. Mod Assist +2 for safety at start and pt fatigued quickly. Pt unable to advance LE's forward with RW and bil LE's buckling due to paina nd weakness. Pt required MAX assist to maintain balance and assisted to seated rest on therapist's thigh. 2+ Total assist to pivot from therapist thigh to recliner for safe seated rest.   Stairs  Wheelchair Mobility    Modified Rankin (Stroke Patients Only)       Balance Overall balance assessment: Needs assistance Sitting-balance support: No upper extremity supported, Feet unsupported Sitting  balance-Leahy Scale: Fair     Standing balance support: Bilateral upper extremity supported, During functional activity, Reliant on assistive device for balance Standing balance-Leahy Scale: Zero                              Cognition Arousal/Alertness: Awake/alert Behavior During Therapy: Impulsive Overall Cognitive Status: Impaired/Different from baseline Area of Impairment: Orientation, Attention, Memory, Following commands, Safety/judgement, Awareness, Problem solving                 Orientation Level: Disoriented to, Situation Current Attention Level: Sustained Memory: Decreased recall of precautions, Decreased short-term memory Following Commands: Follows one step commands with increased time, Follows multi-step commands inconsistently, Follows multi-step commands with increased time Safety/Judgement: Decreased awareness of safety, Decreased awareness of deficits Awareness: Emergent Problem Solving: Slow processing, Difficulty sequencing, Requires verbal cues, Requires tactile cues          Exercises General Exercises - Lower Extremity Ankle Circles/Pumps: AROM, Both, 10 reps Short Arc Quad: AROM, Both, 10 reps, Supine (knee bent in bed) Hip ABduction/ADduction: AAROM, Right, 10 reps    General Comments        Pertinent Vitals/Pain Pain Assessment Pain Assessment: Faces Faces Pain Scale: Hurts little more Pain Location: Rt hip with weightbearing Pain Descriptors / Indicators: Aching, Grimacing, Guarding, Sore Pain Intervention(s): Limited activity within patient's tolerance, Monitored during session, Repositioned    Home Living Family/patient expects to be discharged to:: Private residence Living Arrangements: Alone (for last week pt's sister staying at night and PCA staying in daytime) Available Help at Discharge: Family;Available PRN/intermittently Type of Home: House Home Access: Ramped entrance       Home Layout: One level Home  Equipment: Shower seat;Cane - single point;Rollator (4 wheels)      Prior Function            PT Goals (current goals can now be found in the care plan section) Acute Rehab PT Goals Patient Stated Goal: Regain IND PT Goal Formulation: With patient Time For Goal Achievement: 03/30/22 Potential to Achieve Goals: Fair Progress towards PT goals: Progressing toward goals    Frequency    Min 2X/week      PT Plan Current plan remains appropriate    Co-evaluation              AM-PAC PT "6 Clicks" Mobility   Outcome Measure  Help needed turning from your back to your side while in a flat bed without using bedrails?: A Lot Help needed moving from lying on your back to sitting on the side of a flat bed without using bedrails?: A Lot Help needed moving to and from a bed to a chair (including a wheelchair)?: Total Help needed standing up from a chair using your arms (e.g., wheelchair or bedside chair)?: Total Help needed to walk in hospital room?: Total Help needed climbing 3-5 steps with a railing? : Total 6 Click Score: 8    End of Session Equipment Utilized During Treatment: Gait belt Activity Tolerance: Patient limited by fatigue;Patient limited by pain Patient left: in chair;with call bell/phone within reach;with family/visitor present Nurse Communication: Mobility status PT Visit Diagnosis: Difficulty in walking, not elsewhere classified (R26.2);Pain Pain - Right/Left: Right Pain - part of body: Hip  Time: 1505-6979 PT Time Calculation (min) (ACUTE ONLY): 16 min  Charges:  $Therapeutic Exercise: 8-22 mins $Therapeutic Activity: 8-22 mins                     Verner Mould, DPT Acute Rehabilitation Services Office [phone] Pager [phone]  03/16/22 4:15 PM

## 2022-03-16 NOTE — Progress Notes (Signed)
PROGRESS NOTE  Kim [name] VFI:[id number] DOB: 1934-11-08 DOA: 03/11/2022 PCP: Sueanne Margarita, DO  HPI/Recap of past 24 hours: 87 y.o. female with medical history significant of permanent a fib on amiodarone and Eliquis, CAD, HOCM, HFpEF, hypothyroidism who presented with right hip pain after a mechanical fall.  Found to have right femur fracture.  She was cleared by cardiology and had a right hip repair on 03/15/2022.  Now off heparin drip and switched back to home Eliquis on 03/16/2022.  03/16/22: She denies having any pain without movement.  She is pleasantly confused.  Delirium precautions in place.  Reorient as needed.  Regulate sleep and wake cycle.  Assessment/Plan: Principal Problem:   Closed right hip fracture (HCC) Active Problems:   Hypothyroidism   Hypertension   CAD (coronary artery disease)   HOCM (hypertrophic obstructive cardiomyopathy) (HCC)   Breast cancer (HCC)   Permanent atrial fibrillation (HCC)   Chronic diastolic CHF (congestive heart failure) (HCC)   Stage 3a chronic kidney disease (CKD) (HCC)   Malnutrition of moderate degree   Preoperative cardiovascular examination   S/P total right hip arthroplasty  Closed right hip fracture post mechanical fall, POA s/p repair on 03/15/2022 by Dr. Alvan Dame:  Cleared by cardiology for surgery. Pain control regimen along with bowel regimen. PT OT assessment under orthopedic surgery's guidance TOC consulted to assist with SNF placement  Acute metabolic encephalopathy in the setting of acute illness and recent surgery Delirium precautions in place Regimen as needed Avoid sedating agents Regular sleep and wake cycle Fall precautions  Acute blood loss anemia post orthopedic surgery Drop in hemoglobin from 13.1 to 11.1. Continue to monitor H&H   Hypothyroidism:  Continue home Synthroid   Hypertension CAD S/P DES RCA 2015 HOCM Chronic diastolic CHF: Blood pressure well controlled.  Denies any anginal symptoms.   Continue home cardiac medications   History of MR s/p MitraClip March 2020 Stable on previous echo   Permanent atrial fibrillation:  Rate controlled on metoprolol On amiodarone for rhythm control Home Eliquis restarted on 03/16/2022 postsurgery.   Breast cancer hx triple positive adenocarcinoma of the left breast Followed by Dr. Benjamine Mola   Resolved AKI on CKD 3A Baseline creatinine appears to be 1.0 with GFR 53. She presented with creatinine of 1.38 and GFR of 38. Creatinine 1.26 with GFR 42. Continue to avoid nephrotoxic agents  Moderate malnutrition: Nutrition Problem: Moderate Malnutrition Etiology: chronic illness Signs/Symptoms: mild fat depletion, mild muscle depletion Interventions: MVI, Ensure Enlive (each supplement provides 350kcal and 20 grams of protein)   Body mass index is 21.69 kg/m.    DVT prophylaxis: Eliquis Code Status:   Code Status: Full Code Family Communication: Plan of care discussed with the patient. Patient status is: Inpatient because of management of hip fracture Level of care: Telemetry    Dispo: The patient is from: Home            Anticipated disposition: Anticipate skilled nursing facility.    Mobility Assessment (last 72 hours)       Status is: Inpatient The patient requires at least 2 midnights for further evaluation and treatment of present condition.    Objective: Vitals:   03/15/22 1855 03/15/22 2315 03/16/22 0300 03/16/22 0621  BP: (!) 98/58 112/67 99/67 106/63  Pulse: 86 95 78 75  Resp: '16 18 18 18  '$ Temp: 98.4 F (36.9 C) 99 F (37.2 C) (!) 97.4 F (36.3 C) 97.8 F (36.6 C)  TempSrc:  Oral Oral Oral  SpO2: 94%  98% 95% 98%  Weight:      Height:        Intake/Output Summary (Last 24 hours) at 03/16/2022 1401 Last data filed at 03/16/2022 0700 Gross per 24 hour  Intake 2636.42 ml  Output 850 ml  Net 1786.42 ml   Filed Weights   03/11/22 1912  Weight: 64.7 kg    Exam:   General: 87 y.o. year-old female  well-developed well-nourished in no acute stress.  She is alert and pleasantly confused.  Hard of hearing. cardiovascular: Regular rate and rhythm no rubs or gallops. Respiratory: Clear to auscultation no wheeze or rales. Abdomen: Soft normal bowel sounds present. Musculoskeletal: No lower extremity edema bilaterally. Skin: No ulcerative lesions noted Psychiatry: Mood is appropriate for condition   Data Reviewed: CBC: Recent Labs  Lab 03/11/22 1430 03/12/22 0529 03/13/22 0614 03/14/22 0618 03/15/22 0509 03/16/22 0452  WBC 7.2 7.0 5.2 5.8 5.6 8.8  NEUTROABS 5.4  --   --   --   --   --   HGB 13.6 12.9 13.6 13.1 13.1 11.1*  HCT 42.4 40.3 41.1 40.3 40.9 33.6*  MCV 103.7* 104.4* 102.8* 102.0* 103.0* 102.8*  PLT 146* 136* 148* 155 153 295*   Basic Metabolic Panel: Recent Labs  Lab 03/11/22 1430 03/13/22 0614 03/14/22 0618 03/15/22 0509 03/16/22 0452  NA 138 138 136 138 132*  K 4.6 4.1 4.5 4.3 5.0  CL 108 104 104 103 100  CO2 '22 27 25 27 24  '$ GLUCOSE 102* 94 96 95 170*  BUN 29* 25* 30* 27* 31*  CREATININE 1.38* 1.01* 1.04* 1.03* 1.26*  CALCIUM 9.2 9.2 9.2 9.4 8.6*   GFR: Estimated Creatinine Clearance: 32.9 mL/min (A) (by C-G formula based on SCr of 1.26 mg/dL (H)). Liver Function Tests: Recent Labs  Lab 03/14/22 0618 03/15/22 0509  AST 14* 13*  ALT 14 12  ALKPHOS 63 66  BILITOT 0.9 1.1  PROT 6.2* 6.4*  ALBUMIN 3.2* 3.3*   No results for input(s): "LIPASE", "AMYLASE" in the last 168 hours. No results for input(s): "AMMONIA" in the last 168 hours. Coagulation Profile: Recent Labs  Lab 03/11/22 1808  INR 1.3*   Cardiac Enzymes: No results for input(s): "CKTOTAL", "CKMB", "CKMBINDEX", "TROPONINI" in the last 168 hours. BNP (last 3 results) No results for input(s): "PROBNP" in the last 8760 hours. HbA1C: No results for input(s): "HGBA1C" in the last 72 hours. CBG: No results for input(s): "GLUCAP" in the last 168 hours. Lipid Profile: No results for  input(s): "CHOL", "HDL", "LDLCALC", "TRIG", "CHOLHDL", "LDLDIRECT" in the last 72 hours. Thyroid Function Tests: No results for input(s): "TSH", "T4TOTAL", "FREET4", "T3FREE", "THYROIDAB" in the last 72 hours. Anemia Panel: No results for input(s): "VITAMINB12", "FOLATE", "FERRITIN", "TIBC", "IRON", "RETICCTPCT" in the last 72 hours. Urine analysis:    Component Value Date/Time   COLORURINE YELLOW 08/24/2021 2148   APPEARANCEUR HAZY (A) 08/24/2021 2148   LABSPEC 1.018 08/24/2021 2148   PHURINE 5.0 08/24/2021 2148   GLUCOSEU NEGATIVE 08/24/2021 2148   HGBUR NEGATIVE 08/24/2021 2148   BILIRUBINUR NEGATIVE 08/24/2021 2148   BILIRUBINUR N 10/25/2016 [place] 08/24/2021 2148   PROTEINUR NEGATIVE 08/24/2021 2148   UROBILINOGEN 0.2 10/25/2016 1022   NITRITE NEGATIVE 08/24/2021 2148   LEUKOCYTESUR NEGATIVE 08/24/2021 2148   Sepsis Labs: '@LABRCNTIP'$ (procalcitonin:4,lacticidven:4)  )No results found for this or any previous visit (from the past 240 hour(s)).    Studies: DG Pelvis Portable  Result Date: 03/15/2022 CLINICAL DATA:  Postop right hip  arthroplasty conversion EXAM: PORTABLE PELVIS 1-2 VIEWS COMPARISON:  08/22/2021 FINDINGS: Status post right hip total arthroplasty about persistently displaced, nonacute fractures of the intratrochanteric right femur with evidence of prior osteotomy. Expected overlying postoperative change. No perihardware fracture or component malpositioning. IMPRESSION: Status post right hip total arthroplasty about persistently displaced, nonacute fractures of the intratrochanteric right femur with evidence of prior osteotomy. No evidence of acute perihardware fracture or component malpositioning. Electronically Signed   By: Delanna Ahmadi M.D.   On: 03/15/2022 18:59    Scheduled Meds:  amiodarone  200 mg Oral Daily   apixaban  5 mg Oral BID   diltiazem  240 mg Oral Daily   docusate sodium  100 mg Oral BID   exemestane  25 mg Oral QPC breakfast    feeding supplement  237 mL Oral BID BM   ferrous gluconate  324 mg Oral Q lunch   gabapentin  100 mg Oral QODAY   levothyroxine  137 mcg Oral QAC breakfast   metoprolol succinate  50 mg Oral QHS   multivitamin with minerals  1 tablet Oral Daily   vitamin B-12  1,000 mcg Oral Daily    Continuous Infusions:  methocarbamol (ROBAXIN) IV       LOS: 5 days     Kayleen Memos, MD [facility] Pager [phone]  If 7PM-7AM, please contact night-coverage [email or web address] Password [facility] 03/16/2022, 2:01 PM

## 2022-03-16 NOTE — NC FL2 (Signed)
[place] LEVEL OF CARE SCREENING TOOL     IDENTIFICATION  Patient Name: Kim [name] Birthdate: [birth year] Sex: female Admission Date (Current Location): 03/11/2022  [facility] and [place] Number:  Herbalist and Address:  [facility],  [place], [place]      Provider Number: [id number]  Attending Physician Name and Address:  Kayleen Memos, DO  Relative Name and Phone Number:  Daughter, Aris [place] ([phone])    Current Level of Care: Hospital Recommended Level of Care: [place] Prior Approval Number:    Date Approved/Denied:   PASRR Number: [id number]  Discharge Plan: SNF    Current Diagnoses: Patient Active Problem List   Diagnosis Date Noted   S/P total right hip arthroplasty 03/15/2022   Malnutrition of moderate degree 03/12/2022   Preoperative cardiovascular examination    Closed right hip fracture ([place]) 03/11/2022   Permanent atrial fibrillation ([place]) 03/11/2022   Chronic diastolic CHF (congestive heart failure) ([place]) 03/11/2022   Stage 3a chronic kidney disease (CKD) ([place]) 03/11/2022   Nodule of upper lobe of left lung 02/03/2022   Acute blood loss anemia (ABLA)    Paroxysmal atrial fibrillation with rapid ventricular response ([place]) 08/23/2021   History of breast cancer 08/23/2021   Hip fracture ([place]) 08/22/2021   Severe mitral regurgitation 12/06/2018   Persistent atrial fibrillation with rapid ventricular response ([place]) 10/14/2018   HNP (herniated nucleus pulposus), lumbar 03/01/2018   History of total knee replacement, right 11/13/2017   OA (osteoarthritis) of knee 10/24/2017   Chronic pain of right knee 06/17/2017   Osteoporosis 07/30/2016   Iron deficiency anemia due to chronic blood loss 01/07/2016   Malabsorption of iron 01/07/2016   Postoperative state 01/17/2015   HOCM (hypertrophic obstructive cardiomyopathy) ([place]) 01/09/2014   Breast cancer ([place]) 01/09/2014   Coronary  atherosclerosis of native coronary artery 12/20/2013   CAD (coronary artery disease)    Hyperlipidemia    Malignant neoplasm of upper-outer quadrant of left breast in female, estrogen receptor positive ([place]) 11/05/2013   DCIS (ductal carcinoma in situ) of breast 10/26/2013   Hypertension 04/05/2012   UNSPECIFIED HEARING LOSS 08/07/2010   LOW BACK PAIN 04/22/2009   CONSTIPATION 11/04/2008   COLONIC POLYPS, HX OF 11/01/2008   ALLERGIC RHINITIS 06/27/2007   Hypothyroidism 06/22/2007    Orientation RESPIRATION BLADDER Height & Weight     Self, Time, Situation, Place  Normal Incontinent, External catheter Weight: 142 lb 10.2 oz (64.7 kg) Height:  '5\' 8"'$  (172.7 cm)  BEHAVIORAL SYMPTOMS/MOOD NEUROLOGICAL BOWEL NUTRITION STATUS      Continent Diet (Normal)  AMBULATORY STATUS COMMUNICATION OF NEEDS Skin   Extensive Assist Verbally Normal                       Personal Care Assistance Level of Assistance  Bathing, Feeding, Dressing, Total care Bathing Assistance: Independent Feeding assistance: Independent Dressing Assistance: Independent Total Care Assistance: Independent   Functional Limitations Info  Sight, Hearing, Speech Sight Info: Adequate Hearing Info: Impaired Speech Info: Adequate    SPECIAL CARE FACTORS FREQUENCY                       Contractures Contractures Info: Not present    Additional Factors Info  Code Status, Allergies Code Status Info: Full Allergies Info: Penicillins, Propoxyphene N-acetaminophen, Levaquin (Levofloxacin), Repatha (Evolocumab), Statins, Cortisone, Cymbalta (Duloxetine Hcl), Myrbetriq (Mirabegron), Pseudoephedrine  Current Medications (03/16/2022):  This is the current hospital active medication list Current Facility-Administered Medications  Medication Dose Route Frequency Provider Last Rate Last Admin   acetaminophen (TYLENOL) tablet 325-650 mg  325-650 mg Oral Q6H PRN Irving Copas, PA-C       amiodarone  (PACERONE) tablet 200 mg  200 mg Oral Daily Irving Copas, PA-C   200 mg at 03/16/22 1130   apixaban (ELIQUIS) tablet 5 mg  5 mg Oral BID Paralee Cancel, MD   5 mg at 03/16/22 1129   bisacodyl (DULCOLAX) suppository 10 mg  10 mg Rectal Daily PRN Irving Copas, PA-C       diclofenac Sodium (VOLTAREN) 1 % topical gel 4 g  4 g Topical BID PRN Irving Copas, PA-C       diltiazem (CARDIZEM CD) 24 hr capsule 240 mg  240 mg Oral Daily Irving Copas, PA-C   240 mg at 03/16/22 1129   diphenhydrAMINE (BENADRYL) 12.5 MG/5ML elixir 12.5-25 mg  12.5-25 mg Oral Q4H PRN Irving Copas, PA-C       docusate sodium (COLACE) capsule 100 mg  100 mg Oral BID Irving Copas, PA-C   100 mg at 03/16/22 1129   exemestane (AROMASIN) tablet 25 mg  25 mg Oral QPC breakfast Irving Copas, PA-C   25 mg at 03/16/22 1129   feeding supplement (ENSURE ENLIVE / ENSURE PLUS) liquid 237 mL  237 mL Oral BID BM Irving Copas, PA-C   237 mL at 03/16/22 1130   ferrous gluconate (FERGON) tablet 324 mg  324 mg Oral Q lunch Irving Copas, PA-C   324 mg at 03/16/22 1130   gabapentin (NEURONTIN) capsule 100 mg  100 mg Oral Kizzie Furnish, PA-C   100 mg at 03/16/22 1129   HYDROcodone-acetaminophen (NORCO/VICODIN) 5-325 MG per tablet 1-2 tablet  1-2 tablet Oral Q4H PRN Irving Copas, PA-C   1 tablet at 03/16/22 0152   levothyroxine (SYNTHROID) tablet 137 mcg  137 mcg Oral QAC breakfast Irving Copas, PA-C   137 mcg at 03/16/22 9528   melatonin tablet 5 mg  5 mg Oral QHS Hall, Carole N, DO       menthol-cetylpyridinium (CEPACOL) lozenge 3 mg  1 lozenge Oral PRN Irving Copas, PA-C       Or   phenol (CHLORASEPTIC) mouth spray 1 spray  1 spray Mouth/Throat PRN Irving Copas, PA-C       methocarbamol (ROBAXIN) tablet 500 mg  500 mg Oral Q6H PRN Irving Copas, PA-C       Or   methocarbamol (ROBAXIN) 500 mg in dextrose 5 % 50 mL IVPB  500 mg Intravenous Q6H PRN Irving Copas, PA-C        metoCLOPramide (REGLAN) tablet 5-10 mg  5-10 mg Oral Q8H PRN Irving Copas, PA-C       Or   metoCLOPramide (REGLAN) injection 5-10 mg  5-10 mg Intravenous Q8H PRN Irving Copas, PA-C       metoprolol succinate (TOPROL-XL) 24 hr tablet 50 mg  50 mg Oral QHS Irving Copas, PA-C   50 mg at 03/14/22 2107   morphine (PF) 2 MG/ML injection 0.5 mg  0.5 mg Intravenous Q2H PRN Irving Copas, PA-C       multivitamin with minerals tablet 1 tablet  1 tablet Oral Daily Irving Copas, PA-C   1 tablet at 03/16/22 1130   ondansetron (ZOFRAN)  tablet 4 mg  4 mg Oral Q6H PRN Irving Copas, PA-C       Or   ondansetron [facility]) injection 4 mg  4 mg Intravenous Q6H PRN Costella Hatcher R, PA-C       polyethylene glycol (MIRALAX / GLYCOLAX) packet 17 g  17 g Oral Daily PRN Irving Copas, PA-C       senna-docusate (Senokot-S) tablet 1 tablet  1 tablet Oral QHS PRN Irving Copas, PA-C       vitamin B-12 (CYANOCOBALAMIN) tablet 1,000 mcg  1,000 mcg Oral Daily Irving Copas, PA-C   1,000 mcg at 03/16/22 1130     Discharge Medications: Please see discharge summary for a list of discharge medications.  Relevant Imaging Results:  Relevant Lab Results:   Additional Information SSN: [id number].  Pt is vaccinated for covid but no booster.  Vassie Moselle, LCSW

## 2022-03-16 NOTE — Op Note (Signed)
NAME: Kim [name], Kim [name] MEDICAL RECORD NO: [id number] ACCOUNT NO: [id number] DATE OF BIRTH: [birth year] FACILITY: [facility] LOCATION: [facility] PHYSICIAN: Pietro Cassis. Alvan Dame, MD  Operative Report   DATE OF PROCEDURE: 03/15/2022  PREOPERATIVE DIAGNOSES:  Failed right previous hip surgery for comminuted right intertrochanteric femur fracture with malunion versus nonunion with penetration of the lag screw into the acetabulum.  POSTOPERATIVE DIAGNOSES:  Failed right previous hip surgery for comminuted right intertrochanteric femur fracture with malunion versus nonunion with penetration of the lag screw into the acetabulum.  PROCEDURE:  Conversion of right hip surgery to right total hip arthroplasty.  COMPONENTS USED:  A DePuy hip system with a size 52 Pinnacle shell with a 36 +4 neutral AltrX liner.  Two cancellous screws placed into the ilium.  On the femoral side, I used a size 16.5 large stature AML 6-inch stem with a 36 +12 ball.  SURGEON:  Pietro Cassis. Alvan Dame, MD  ASSISTANT:  Costella Hatcher, PA-C.  Note that Ms. Kim [name] was present the procedure.  She assisted with management of the operative extremity, general facilitation of the case and primary wound closure.  ANESTHESIA:  General.  BLOOD LOSS:  About 450 mL.  DRAINS:  None.  COMPLICATIONS:  None.  INDICATIONS FOR THE PROCEDURE:  The patient is an 87 year old female who unfortunately fell in November 2022.  She was found to have a comminuted intertrochanteric femur fracture.  She underwent an open reduction and internal fixation with intramedullary  nail.  On her last office visit, she had been apparently healing well without complication.  Apparently, she has been having a couple other falls.  Radiographs in the emergency room revealed penetration of the lag screw into the acetabulum.  Based on  this, she was admitted to the hospital and scheduled for surgery.  The indication of the procedure were reviewed.  Risks of malunion, infection, DVT,  dislocation, neurovascular injury, and need for future surgeries were discussed and reviewed.  Consent  was obtained for benefit of pain relief.  DESCRIPTION OF PROCEDURE:  The patient was brought to the operative theater.  Once adequate anesthesia, preoperative antibiotics, vancomycin administered as well as gentamicin.  She was positioned into the left lateral decubitus position with the right  hip up.  The right lower extremity was then prepped and draped in sterile fashion.  Timeout was performed identifying the patient, planned procedure, and extremity.  First, I identified her distal incision for her distal interlocking screw.  Incision was  made sharply through the skin and the iliotibial band.  I then was able to palpate the screw and using the standard screwdriver, removed the distal interlocking screw.  Once this was done, I made an incision for posterior approach to the hip with some  extensile approach over the distal aspect of the proximal femur to expose the hip.  I was able to identify the incision through the iliotibial band where the lag screw was placed.  I identified this and then attached the lag screw screwdriver with the  attachment screw device.  Once this was in, I exposed the proximal aspect of the femur.  I removed bone over the proximal aspect of the nail.  With the nail exposed, I first used the screwdriver to remove the locking bolt.  Loosening this up, I was able  to remove the lag screw without event.  I then had to remove further bone around the nail itself proximally and the nail was then able to be removed easily without consequence.  At  this point, I irrigated these wounds.  I reapproximated the gluteal  fascia proximally for nail removal with #1 Vicryl.  I also reapproximated the area of the lag screw insertion with #1 Vicryl.  At this point, the posterior aspect of the hip was exposed.  We encountered what would be expected at this point with a  posterior displaced  greater trochanter segment.  I used an osteotome to remove some of this excessive bone.  I did find that there was some malunited segments.  Once I had the posterior aspect of the hip exposed, I used an oscillating saw, made a neck  osteotomy.  I removed the femoral head.  There was shortening through the femoral head and neck region.  I removed further bone for exposure purposes to allow for preparation of the femur.  With the proximal femur exposed, identifying malunion  proximally, but what appeared to be a stable calcar that slightly displaced.  I began reaming with a 10 mm reamer and reamed up to 14 mm in 1 mm increments and then by 0.5 mm diameter from there.  I reamed up to a total of 16 mm.  At that point, I felt I  had good fixation distally.  I then assessed this with a 16.5 reamer on hand.  Once I felt comfortable with the reaming that had been performed, I began broaching with a 15 small stature, then 16 small stature broaches and then the 15 large and then the  16.5 large stature broaches.  After broaching, I directed attention to the acetabulum.  Acetabular exposure was obtained in routine fashion with retractors placed.  It readily identified the area where the lag screw had penetrated into the acetabulum.   I debrided the acetabular soft tissues around the rim.  Once this was exposed, I began reaming with a 47 reamer.  The lag screw did not penetrate the medial wall of the acetabulum.  I reamed to this area and expanded up to 51 mm.  We selected a 52  Pinnacle shell.  The shell was then impacted with about 35-40 degrees of abduction and at least 20 degrees of forward flexion.  I was able to place 2 cancellous screws in the ilium.  I did place a trial neutral liner at this point.  At this point, we did  a trial reduction with the 16 large stature broach in place.  Here, I did find that with the 36+5 ball that combined anteversion was about 50 degrees.  There were still some concerns of  shortening through the right lower extremity.  There was some  evidence of some impingement.  I then retrialed with an 8.5 and then actually the +12 ball.  Given these trial reductions, we selected the final 16.5 large stature AML stem.  It was impacted using a reamer to guide anteversion.  Once it was fully seated,  we retrialed with the neutral liner to assess for stability.  I ended up electing to use the 36+4 neutral liner.  Trial liner was removed and the final liner was impacted into place.  I then retrialed and focused on the 36, 8.5 versus 12 ball.  I ended  up selecting a 36 +12 ball because I felt that it helped to restore leg length, which was one of our secondary objectives.  The final 36 +12 Articul/EZE metal head ball was impacted on a clean and dried trunnion and the hip was reduced.  The hip had been  irrigated throughout the  case and again at this point, I was able to reapproximate some of the posterior soft tissues over the posterior aspect of the hip.  We then reapproximated the iliotibial band and gluteal fascia using #1 Vicryl and #1 Stratafix  suture.  The remainder of the wound was closed with 2-0 Vicryl and a running Monocryl stitch.  The distal incision was closed in layers as well.  All wounds were cleaned, dried and dressed sterilely using surgical glue and Aquacel dressing.  The patient  was then brought to the recovery room, extubated in stable condition, tolerating the procedure well.  Findings reviewed with her daughter.  Postoperatively, we will allow her to be weightbearing as tolerated.  She will remain in the hospital for  disposition purposes, being evaluated and worked with physical therapy.  Based on her history and medical issues and social situations, likely she will require short-term nursing facility.  We will hope to get her through this in a safe fashion.   MUK D: 03/15/2022 6:03:09 pm T: 03/16/2022 12:37:00 am  JOB: [id number]/ [id number]

## 2022-03-16 NOTE — Evaluation (Addendum)
Physical Therapy Re-Evaluation Patient Details Name: Kim [name] MRN: [id number] DOB: 12/15/1934 Today's Date: 03/16/2022  History of Present Illness  Pt admitted from home s/p fall 03/08/22 and now unable to walk and c/o R hip and knee pain.  Pt with hx of CAD, CHF, HOH, HOCM, MI, a-fib, back surgery, R TKR and R hip fx with IM nailing. Pt now s/p conversion to Rt THA posterior approach on 03/15/22.    Clinical Impression  Kim [name] is 87 y.o. female admitted with above HPI and diagnosis, now s/p Rt THA on 03/15/22. Patient is currently limited by functional impairments below (see PT problem list). Pt eager to mobilize to bathroom and required repeated cues for safety to slow down. Pt required Mod Assist to rise from EOB with +2 for safety. Pt unable to take multiple successful forward steps after ~3' and LE's began to give out with pt unable to use UE's on RW to prevent LOB. MAX assist required to provide safe seated rest on therapist's thigh, [pt then transitioned to recliner with +2 Total Assist. Patient will benefit from continued skilled PT interventions to address impairments and progress independence with mobility, recommending SNF with 24/7 assist for follow up rehab. Acute PT will follow and progress as able.      Recommendations for follow up therapy are one component of a multi-disciplinary discharge planning process, led by the attending physician.  Recommendations may be updated based on patient status, additional functional criteria and insurance authorization.  Follow Up Recommendations Skilled nursing-short term rehab (<3 hours/day)    Assistance Recommended at Discharge Frequent or constant Supervision/Assistance  Patient can return home with the following  Two people to help with walking and/or transfers;Two people to help with bathing/dressing/bathroom;Assistance with cooking/housework;Direct supervision/assist for medications management;Direct supervision/assist for  financial management;Assist for transportation;Help with stairs or ramp for entrance    Equipment Recommendations None recommended by PT  Recommendations for Other Services       Functional Status Assessment Patient has had a recent decline in their functional status and demonstrates the ability to make significant improvements in function in a reasonable and predictable amount of time.     Precautions / Restrictions Precautions Precautions: Fall Precaution Comments: No posterior hip precautions per MD Restrictions Weight Bearing Restrictions: No RLE Weight Bearing: Weight bearing as tolerated      Mobility  Bed Mobility Overal bed mobility: Needs Assistance Bed Mobility: Supine to Sit     Supine to sit: Mod assist, HOB elevated, +2 for safety/equipment, +2 for physical assistance     General bed mobility comments: Mod +2 assist to bring LE's off EOB and to raise trunk upright.    Transfers Overall transfer level: Needs assistance Equipment used: Rolling walker (2 wheels) Transfers: Sit to/from Stand Sit to Stand: Mod assist, +2 safety/equipment, From elevated surface, +2 physical assistance           General transfer comment: Mod+2 to rise from elevated EOB and transition hands to RW.    Ambulation/Gait Ambulation/Gait assistance: Max assist, +2 safety/equipment, +2 physical assistance Gait Distance (Feet): 4 Feet Assistive device: Rolling walker (2 wheels) Gait Pattern/deviations: Step-to pattern, Decreased step length - right, Decreased step length - left, Shuffle, Narrow base of support, Trunk flexed, Antalgic, Knees buckling, Knee flexed in stance - left, Knee flexed in stance - right       General Gait Details: pt attempting forward steps with RW to go to bathroom. Mod Assist +2 for safety at start  and pt fatigued quickly. Pt unable to advance LE's forward with RW and bil LE's buckling due to paina nd weakness. Pt required MAX assist to maintain balance and  assisted to seated rest on therapist's thigh. 2+ Total assist to pivot from therapist thigh to recliner for safe seated rest.  Stairs            Wheelchair Mobility    Modified Rankin (Stroke Patients Only)       Balance Overall balance assessment: Needs assistance Sitting-balance support: No upper extremity supported, Feet unsupported Sitting balance-Leahy Scale: Fair     Standing balance support: Bilateral upper extremity supported, During functional activity, Reliant on assistive device for balance Standing balance-Leahy Scale: Zero                               Pertinent Vitals/Pain Pain Assessment Pain Assessment: Faces Faces Pain Scale: Hurts little more Pain Location: Rt hip with weightbearing Pain Descriptors / Indicators: Aching, Grimacing, Guarding, Sore Pain Intervention(s): Limited activity within patient's tolerance, Monitored during session, Repositioned    Home Living Family/patient expects to be discharged to:: Private residence Living Arrangements: Alone (for last week pt's sister staying at night and PCA staying in daytime) Available Help at Discharge: Family;Available PRN/intermittently Type of Home: House Home Access: Ramped entrance       Home Layout: One level Home Equipment: Shower seat;Cane - single point;Rollator (4 wheels)      Prior Function Prior Level of Function : Needs assist             Mobility Comments: uses rollator at baseline ADLs Comments: pt has had care attentdent for ~1 week during day but does not have 24/7 care     Hand Dominance   Dominant Hand: Right    Extremity/Trunk Assessment   Upper Extremity Assessment Upper Extremity Assessment: Generalized weakness;Defer to OT evaluation    Lower Extremity Assessment Lower Extremity Assessment: Generalized weakness    Cervical / Trunk Assessment Cervical / Trunk Assessment: Kyphotic  Communication   Communication: HOH  Cognition  Arousal/Alertness: Awake/alert Behavior During Therapy: Impulsive Overall Cognitive Status: Impaired/Different from baseline Area of Impairment: Orientation, Attention, Memory, Following commands, Safety/judgement, Awareness, Problem solving                 Orientation Level: Disoriented to, Situation Current Attention Level: Sustained Memory: Decreased recall of precautions, Decreased short-term memory Following Commands: Follows one step commands with increased time, Follows multi-step commands inconsistently, Follows multi-step commands with increased time Safety/Judgement: Decreased awareness of safety, Decreased awareness of deficits Awareness: Emergent Problem Solving: Slow processing, Difficulty sequencing, Requires verbal cues, Requires tactile cues          General Comments      Exercises     Assessment/Plan    PT Assessment Patient needs continued PT services  PT Problem List Decreased strength;Decreased range of motion;Decreased activity tolerance;Decreased balance;Decreased mobility;Decreased knowledge of use of DME;Pain;Decreased cognition;Decreased safety awareness;Decreased knowledge of precautions       PT Treatment Interventions DME instruction;Gait training;Functional mobility training;Therapeutic activities;Therapeutic exercise;Balance training;Patient/family education    PT Goals (Current goals can be found in the Care Plan section)  Acute Rehab PT Goals Patient Stated Goal: Regain IND PT Goal Formulation: With patient Time For Goal Achievement: 03/30/22 Potential to Achieve Goals: Fair    Frequency Min 2X/week     Co-evaluation  AM-PAC PT "6 Clicks" Mobility  Outcome Measure Help needed turning from your back to your side while in a flat bed without using bedrails?: A Lot Help needed moving from lying on your back to sitting on the side of a flat bed without using bedrails?: A Lot Help needed moving to and from a bed to a  chair (including a wheelchair)?: Total Help needed standing up from a chair using your arms (e.g., wheelchair or bedside chair)?: Total Help needed to walk in hospital room?: Total Help needed climbing 3-5 steps with a railing? : Total 6 Click Score: 8    End of Session Equipment Utilized During Treatment: Gait belt Activity Tolerance: Patient limited by fatigue;Patient limited by pain Patient left: in chair;with call bell/phone within reach;with family/visitor present Nurse Communication: Mobility status PT Visit Diagnosis: Difficulty in walking, not elsewhere classified (R26.2);Pain Pain - Right/Left: Right Pain - part of body: Hip    Time: 1350-1419 PT Time Calculation (min) (ACUTE ONLY): 29 min   Charges:   PT Evaluation $PT Re-evaluation: 1 Re-eval PT Treatments $Therapeutic Activity: 8-22 mins        Verner Mould, DPT Acute Rehabilitation Services Office [phone] Pager [phone]  03/16/22 4:04 PM

## 2022-03-16 NOTE — Plan of Care (Signed)

## 2022-03-16 NOTE — Progress Notes (Signed)
Patient ID: Kim [name], female   DOB: 1935/09/01, 87 y.o.   MRN: [id number] Subjective: 1 Day Post-Op Procedure(s) (LRB): CONVERSION TO TOTAL HIP (Right)    Patient reports pain as mild to moderate. No events No OOB activity yet She is pleasantly confused - ? baseline  Objective:   VITALS:   Vitals:   03/16/22 0300 03/16/22 0621  BP: 99/67 106/63  Pulse: 78 75  Resp: 18 18  Temp: (!) 97.4 F (36.3 C) 97.8 F (36.6 C)  SpO2: 95% 98%    Neurovascular intact Incision: dressing C/D/I - right hip  LABS Recent Labs    03/14/22 0618 03/15/22 0509 03/16/22 0452  HGB 13.1 13.1 11.1*  HCT 40.3 40.9 33.6*  WBC 5.8 5.6 8.8  PLT 155 153 140*    Recent Labs    03/14/22 0618 03/15/22 0509 03/16/22 0452  NA 136 138 132*  K 4.5 4.3 5.0  BUN 30* 27* 31*  CREATININE 1.04* 1.03* 1.26*  GLUCOSE 96 95 170*    No results for input(s): "LABPT", "INR" in the last 72 hours.   Assessment/Plan: 1 Day Post-Op Procedure(s) (LRB): CONVERSION TO TOTAL HIP (Right)   Advance diet Up with therapy - WBAT RLE Dispo pending therapy eval and recs with discussion with family Resume Eliqui, D/C ASA RTC in 2 weeks Monitor pain med use and mental status

## 2022-03-17 DIAGNOSIS — E440 Moderate protein-calorie malnutrition: Secondary | ICD-10-CM

## 2022-03-17 DIAGNOSIS — N1831 Chronic kidney disease, stage 3a: Secondary | ICD-10-CM

## 2022-03-17 DIAGNOSIS — I10 Essential (primary) hypertension: Secondary | ICD-10-CM | POA: Diagnosis not present

## 2022-03-17 DIAGNOSIS — E039 Hypothyroidism, unspecified: Secondary | ICD-10-CM

## 2022-03-17 DIAGNOSIS — I5032 Chronic diastolic (congestive) heart failure: Secondary | ICD-10-CM | POA: Diagnosis not present

## 2022-03-17 DIAGNOSIS — S72001A Fracture of unspecified part of neck of right femur, initial encounter for closed fracture: Secondary | ICD-10-CM | POA: Diagnosis not present

## 2022-03-17 DIAGNOSIS — I421 Obstructive hypertrophic cardiomyopathy: Secondary | ICD-10-CM | POA: Diagnosis not present

## 2022-03-17 DIAGNOSIS — Z96641 Presence of right artificial hip joint: Secondary | ICD-10-CM

## 2022-03-17 LAB — CBC
HCT: 31.3 % — ABNORMAL LOW (ref 36.0–46.0)
Hemoglobin: 10.3 g/dL — ABNORMAL LOW (ref 12.0–15.0)
MCH: 33.3 pg (ref 26.0–34.0)
MCHC: 32.9 g/dL (ref 30.0–36.0)
MCV: 101.3 fL — ABNORMAL HIGH (ref 80.0–100.0)
Platelets: 157 K/uL (ref 150–400)
RBC: 3.09 MIL/uL — ABNORMAL LOW (ref 3.87–5.11)
RDW: 12.8 % (ref 11.5–15.5)
WBC: 11.5 K/uL — ABNORMAL HIGH (ref 4.0–10.5)
nRBC: 0 % (ref 0.0–0.2)

## 2022-03-17 LAB — COMPREHENSIVE METABOLIC PANEL
ALT: 17 U/L (ref 0–44)
AST: 16 U/L (ref 15–41)
Albumin: 3 g/dL — ABNORMAL LOW (ref 3.5–5.0)
Alkaline Phosphatase: 53 U/L (ref 38–126)
Anion gap: 8 (ref 5–15)
BUN: 36 mg/dL — ABNORMAL HIGH (ref 8–23)
CO2: 24 mmol/L (ref 22–32)
Calcium: 9.4 mg/dL (ref 8.9–10.3)
Chloride: 103 mmol/L (ref 98–111)
Creatinine, Ser: 1.32 mg/dL — ABNORMAL HIGH (ref 0.44–1.00)
GFR, Estimated: 40 mL/min — ABNORMAL LOW (ref >60)
Glucose, Bld: 137 mg/dL — ABNORMAL HIGH (ref 70–99)
Potassium: 4.7 mmol/L (ref 3.5–5.1)
Sodium: 135 mmol/L (ref 135–145)
Total Bilirubin: 0.9 mg/dL (ref 0.3–1.2)
Total Protein: 5.9 g/dL — ABNORMAL LOW (ref 6.5–8.1)

## 2022-03-17 LAB — PHOSPHORUS: Phosphorus: 3.7 mg/dL (ref 2.5–4.6)

## 2022-03-17 LAB — MAGNESIUM: Magnesium: 2.4 mg/dL (ref 1.7–2.4)

## 2022-03-17 MED ORDER — HYDROCODONE-ACETAMINOPHEN 5-325 MG PO TABS: 1.0000 | ORAL_TABLET | Freq: Three times a day (TID) | ORAL | 0 refills | Status: DC | PRN

## 2022-03-17 MED ORDER — SODIUM CHLORIDE 0.9 % IV SOLN: INTRAVENOUS | Status: DC

## 2022-03-17 NOTE — Progress Notes (Addendum)
Subjective: 2 Days Post-Op Procedure(s) (LRB): CONVERSION TO TOTAL HIP (Right) Patient reports pain as mild.   Patient seen in rounds for Dr. Alvan Dame. Patient is resting in bed on exam this morning. She is alert and recognizes that she is confused. She tells me she had a hip surgery yesterday, but continues to tell me she doesn't understand why she is here. She denies pain in the hip but states it is hard to move it. She requests that we continue to update her daughter. No acute events overnight.  We will continue therapy today.   Objective: Vital signs in last 24 hours: Temp:  [98.4 F (36.9 C)-99.7 F (37.6 C)] 98.4 F (36.9 C) (06/14 0603) Pulse Rate:  [91-101] 98 (06/14 0603) Resp:  [15-20] 16 (06/14 0603) BP: (107-133)/(66-69) 133/66 (06/14 0603) SpO2:  [96 %-100 %] 97 % (06/14 0603)  Intake/Output from previous day: No intake or output data in the 24 hours ending 03/17/22 0744   Intake/Output this shift: No intake/output data recorded.  Labs: Recent Labs    03/15/22 0509 03/16/22 0452 03/17/22 0547  HGB 13.1 11.1* 10.3*   Recent Labs    03/16/22 0452 03/17/22 0547  WBC 8.8 11.5*  RBC 3.27* 3.09*  HCT 33.6* 31.3*  PLT 140* 157   Recent Labs    03/16/22 0452 03/17/22 0547  NA 132* 135  K 5.0 4.7  CL 100 103  CO2 24 24  BUN 31* 36*  CREATININE 1.26* 1.32*  GLUCOSE 170* 137*  CALCIUM 8.6* 9.4   No results for input(s): "LABPT", "INR" in the last 72 hours.  Exam: General - Patient is Alert and Confused Extremity - Neurologically intact Sensation intact distally Intact pulses distally Dorsiflexion/Plantar flexion intact Dressing - dressing C/D/I Motor Function - intact, moving foot and toes well on exam.   Past Medical History:  Diagnosis Date   Allergic rhinitis    Allergy    Arthritis    hands   Breast cancer ([place])    Breast cancer of upper-outer quadrant of left female breast ([place]) 11/08/2013   finished chemo and radiation 2015   CAD  (coronary artery disease)    a. 12/2013 Cath/PCI: LM nl, LAD 20p, 86m 30d, D1 30p, LCX 20p, 365mOM1 20, Mo2 40p, RCA 30p, 9991m.0x16 Promus Premier DES), 20d, RPL 30, RPDA 70p, 47m9m 65-70%.   CHF (congestive heart failure) (HCC)    Diverticulosis    HOH (hard of hearing)    bilateral, wears hearing aids   Hx of adenomatous colonic polyps 02/1999   Hyperlipidemia    Hypertension    Hypertrophic obstructive cardiomyopathy (HOCM) (HCC)[place] Cardiologist is Dr. DaltLoralie Champagneypothyroidism    Iron deficiency anemia due to chronic blood loss 01/07/2016   Low back pain    gets ESI from Dr. Max Rennis Hardingalabsorption of iron 01/07/2016   Myocardial infarction (HCC)[place]/2015   very mild-with first chemo -placed stent x1   Persistent atrial fibrillation with rapid ventricular response (HCC)[place]/08/2019   Personal history of radiation therapy 2015   left breast, scv, axilla  05/14/2014-06/26/2014  Dr SaraEppie Gibson/P radiation therapy 05/14/2014-06/26/2014   1) Left Breast  / 50 Gy in 25 fractions, 2) Left Supraclavicular fossa/ 47.5 Gy in 25 fractions, 3) Left Posterior Axillary boost / 4.825 Gy in 25 fractions, 4) Left Breast boost / 10 Gy in 5 fractions    Wears hearing aid  left and right     Assessment/Plan: 2 Days Post-Op Procedure(s) (LRB): CONVERSION TO TOTAL HIP (Right) Principal Problem:   Closed right hip fracture (HCC) Active Problems:   Hypothyroidism   Hypertension   CAD (coronary artery disease)   HOCM (hypertrophic obstructive cardiomyopathy) (HCC)   Breast cancer (HCC)   Permanent atrial fibrillation (HCC)   Chronic diastolic CHF (congestive heart failure) (HCC)   Stage 3a chronic kidney disease (CKD) (HCC)   Malnutrition of moderate degree   Preoperative cardiovascular examination   S/P total right hip arthroplasty  Estimated body mass index is 21.69 kg/m as calculated from the following:   Height as of this encounter: '5\' 8"'$  (1.727 m).   Weight as of this  encounter: 64.7 kg. Advance diet  DVT Prophylaxis -  Eliquis Weight bearing as tolerated.  Plan is to go Skilled nursing facility after hospital stay. Awaiting placement. Continue working with PT. She has been getting minimal pain meds, which I would continue so as to avoid worsening confusion.    Follow up in the office in 1-2 weeks.    I called and spoke with [name], her daughter, this morning. She would like to be notified once bed options available.   Griffith Citron, PA-C Orthopedic Surgery [phone] 03/17/2022, 7:44 AM

## 2022-03-17 NOTE — NC FL2 (Signed)
[place] LEVEL OF CARE SCREENING TOOL     IDENTIFICATION  Patient Name: Kim [name] Birthdate: 08-Feb-1935 Sex: female Admission Date (Current Location): 03/11/2022  [facility] and [place] Number:  Herbalist and Address:  [facility],  [address] [place], [place]      Provider Number: [id number]  Attending Physician Name and Address:  Kerney Elbe, DO  Relative Name and Phone Number:  Daughter, Aris [place] ([phone])    Current Level of Care: Hospital Recommended Level of Care: [place] Prior Approval Number:    Date Approved/Denied:   PASRR Number: [id number]  Discharge Plan: SNF    Current Diagnoses: Patient Active Problem List   Diagnosis Date Noted   S/P total right hip arthroplasty 03/15/2022   Malnutrition of moderate degree 03/12/2022   Preoperative cardiovascular examination    Closed right hip fracture ([place]) 03/11/2022   Permanent atrial fibrillation ([place]) 03/11/2022   Chronic diastolic CHF (congestive heart failure) ([place]) 03/11/2022   Stage 3a chronic kidney disease (CKD) ([place]) 03/11/2022   Nodule of upper lobe of left lung 02/03/2022   Acute blood loss anemia (ABLA)    Paroxysmal atrial fibrillation with rapid ventricular response ([place]) 08/23/2021   History of breast cancer 08/23/2021   Hip fracture ([place]) 08/22/2021   Severe mitral regurgitation 12/06/2018   Persistent atrial fibrillation with rapid ventricular response ([place]) 10/14/2018   HNP (herniated nucleus pulposus), lumbar 03/01/2018   History of total knee replacement, right 11/13/2017   OA (osteoarthritis) of knee 10/24/2017   Chronic pain of right knee 06/17/2017   Osteoporosis 07/30/2016   Iron deficiency anemia due to chronic blood loss 01/07/2016   Malabsorption of iron 01/07/2016   Postoperative state 01/17/2015   HOCM (hypertrophic obstructive cardiomyopathy) ([place]) 01/09/2014   Breast cancer ([place]) 01/09/2014    Coronary atherosclerosis of native coronary artery 12/20/2013   CAD (coronary artery disease)    Hyperlipidemia    Malignant neoplasm of upper-outer quadrant of left breast in female, estrogen receptor positive ([place]) 11/05/2013   DCIS (ductal carcinoma in situ) of breast 10/26/2013   Hypertension 04/05/2012   UNSPECIFIED HEARING LOSS 08/07/2010   LOW BACK PAIN 04/22/2009   CONSTIPATION 11/04/2008   COLONIC POLYPS, HX OF 11/01/2008   ALLERGIC RHINITIS 06/27/2007   Hypothyroidism 06/22/2007    Orientation RESPIRATION BLADDER Height & Weight     Self, Time, Situation, Place  Normal Incontinent, External catheter Weight: 142 lb 10.2 oz (64.7 kg) Height:  '5\' 8"'$  (172.7 cm)  BEHAVIORAL SYMPTOMS/MOOD NEUROLOGICAL BOWEL NUTRITION STATUS      Continent Diet (Normal)  AMBULATORY STATUS COMMUNICATION OF NEEDS Skin   Extensive Assist Verbally Normal                       Personal Care Assistance Level of Assistance  Bathing, Feeding, Dressing, Total care Bathing Assistance: Independent Feeding assistance: Independent Dressing Assistance: Independent Total Care Assistance: Independent   Functional Limitations Info  Sight, Hearing, Speech Sight Info: Adequate Hearing Info: Impaired Speech Info: Adequate    SPECIAL CARE FACTORS FREQUENCY  PT (By licensed PT)     PT Frequency: 5x/wk              Contractures Contractures Info: Not present    Additional Factors Info  Code Status, Allergies Code Status Info: Full Allergies Info: Penicillins, Propoxyphene N-acetaminophen, Levaquin (Levofloxacin), Repatha (Evolocumab), Statins, Cortisone, Cymbalta (Duloxetine Hcl), Myrbetriq (Mirabegron), Pseudoephedrine  Current Medications (03/17/2022):  This is the current hospital active medication list Current Facility-Administered Medications  Medication Dose Route Frequency Provider Last Rate Last Admin   acetaminophen (TYLENOL) tablet 325-650 mg  325-650 mg Oral Q6H PRN  Irving Copas, PA-C       amiodarone (PACERONE) tablet 200 mg  200 mg Oral Daily Irving Copas, PA-C   200 mg at 03/16/22 1130   apixaban (ELIQUIS) tablet 5 mg  5 mg Oral BID Paralee Cancel, MD   5 mg at 03/16/22 1129   bisacodyl (DULCOLAX) suppository 10 mg  10 mg Rectal Daily PRN Irving Copas, PA-C       diclofenac Sodium (VOLTAREN) 1 % topical gel 4 g  4 g Topical BID PRN Irving Copas, PA-C       diltiazem (CARDIZEM CD) 24 hr capsule 240 mg  240 mg Oral Daily Irving Copas, PA-C   240 mg at 03/16/22 1129   diphenhydrAMINE (BENADRYL) 12.5 MG/5ML elixir 12.5-25 mg  12.5-25 mg Oral Q4H PRN Irving Copas, PA-C       docusate sodium (COLACE) capsule 100 mg  100 mg Oral BID Irving Copas, PA-C   100 mg at 03/16/22 1129   exemestane (AROMASIN) tablet 25 mg  25 mg Oral QPC breakfast Irving Copas, PA-C   25 mg at 03/16/22 1129   feeding supplement (ENSURE ENLIVE / ENSURE PLUS) liquid 237 mL  237 mL Oral BID BM Irving Copas, PA-C   237 mL at 03/16/22 1130   ferrous gluconate (FERGON) tablet 324 mg  324 mg Oral Q lunch Irving Copas, PA-C   324 mg at 03/16/22 1130   gabapentin (NEURONTIN) capsule 100 mg  100 mg Oral Kizzie Furnish, PA-C   100 mg at 03/16/22 1129   HYDROcodone-acetaminophen (NORCO/VICODIN) 5-325 MG per tablet 1-2 tablet  1-2 tablet Oral Q4H PRN Irving Copas, PA-C   1 tablet at 03/16/22 0152   levothyroxine (SYNTHROID) tablet 137 mcg  137 mcg Oral QAC breakfast Irving Copas, PA-C   137 mcg at 03/16/22 7741   melatonin tablet 5 mg  5 mg Oral QHS Hall, Carole N, DO       menthol-cetylpyridinium (CEPACOL) lozenge 3 mg  1 lozenge Oral PRN Irving Copas, PA-C       Or   phenol (CHLORASEPTIC) mouth spray 1 spray  1 spray Mouth/Throat PRN Irving Copas, PA-C       methocarbamol (ROBAXIN) tablet 500 mg  500 mg Oral Q6H PRN Irving Copas, PA-C       Or   methocarbamol (ROBAXIN) 500 mg in dextrose 5 % 50 mL IVPB  500 mg  Intravenous Q6H PRN Irving Copas, PA-C       metoCLOPramide (REGLAN) tablet 5-10 mg  5-10 mg Oral Q8H PRN Irving Copas, PA-C       Or   metoCLOPramide (REGLAN) injection 5-10 mg  5-10 mg Intravenous Q8H PRN Irving Copas, PA-C       metoprolol succinate (TOPROL-XL) 24 hr tablet 50 mg  50 mg Oral QHS Irving Copas, PA-C   50 mg at 03/14/22 2107   morphine (PF) 2 MG/ML injection 0.5 mg  0.5 mg Intravenous Q2H PRN Irving Copas, PA-C       multivitamin with minerals tablet 1 tablet  1 tablet Oral Daily Irving Copas, PA-C   1 tablet at 03/16/22 1130   ondansetron (ZOFRAN)  tablet 4 mg  4 mg Oral Q6H PRN Irving Copas, PA-C       Or   ondansetron [facility]) injection 4 mg  4 mg Intravenous Q6H PRN Costella Hatcher R, PA-C       polyethylene glycol (MIRALAX / GLYCOLAX) packet 17 g  17 g Oral Daily PRN Irving Copas, PA-C       senna-docusate (Senokot-S) tablet 1 tablet  1 tablet Oral QHS PRN Irving Copas, PA-C       vitamin B-12 (CYANOCOBALAMIN) tablet 1,000 mcg  1,000 mcg Oral Daily Irving Copas, PA-C   1,000 mcg at 03/16/22 1130     Discharge Medications: Please see discharge summary for a list of discharge medications.  Relevant Imaging Results:  Relevant Lab Results:   Additional Information SSN: [id number].  Pt is vaccinated for covid but no booster.  Vassie Moselle, LCSW

## 2022-03-17 NOTE — Evaluation (Signed)
Occupational Therapy Evaluation Patient Details Name: Kim [name] MRN: [id number] DOB: 12-17-1934 Today's Date: 03/17/2022   History of Present Illness Pt admitted from home s/p fall 03/08/22 and now unable to walk and c/o R hip and knee pain.  Pt with hx of CAD, CHF, HOH, HOCM, MI, a-fib, back surgery, R TKR and R hip fx with IM nailing. Pt now s/p conversion to Rt THA posterior approach on 03/15/22.   Clinical Impression   Pt admitted with the above diagnoses and presents with below problem list. Pt will benefit from continued acute OT to address the below listed deficits and maximize independence with basic ADLs prior to d/c to venue below. PTA, pt was at least mod I with basic ADLs, used rollator. Pt currently needs up to mod assist +2 with LB ADLs, bed mobility, and stand pivot transfers to access recliner/BSC. + lightheadedness with onset during transfer, resolved with time and reclined position in chair.        Recommendations for follow up therapy are one component of a multi-disciplinary discharge planning process, led by the attending physician.  Recommendations may be updated based on patient status, additional functional criteria and insurance authorization.   Follow Up Recommendations  Skilled nursing-short term rehab (<3 hours/day)    Assistance Recommended at Discharge Frequent or constant Supervision/Assistance  Patient can return home with the following Two people to help with walking and/or transfers;Two people to help with bathing/dressing/bathroom;Assistance with cooking/housework;Direct supervision/assist for medications management;Assist for transportation;Help with stairs or ramp for entrance    Functional Status Assessment  Patient has had a recent decline in their functional status and demonstrates the ability to make significant improvements in function in a reasonable and predictable amount of time.  Equipment Recommendations  Other (comment) (defer to next venue)     Recommendations for Other Services       Precautions / Restrictions Precautions Precautions: Fall Precaution Comments: No posterior hip precautions per MD Restrictions Weight Bearing Restrictions: No RLE Weight Bearing: Weight bearing as tolerated      Mobility Bed Mobility Overal bed mobility: Needs Assistance Bed Mobility: Supine to Sit     Supine to sit: Mod assist, HOB elevated, +2 for safety/equipment, +2 for physical assistance     General bed mobility comments: Assist to advance RLE, pivot hips and powerup trunk. Pt with increased pain during bed mobility.    Transfers Overall transfer level: Needs assistance Equipment used: Rolling walker (2 wheels) Transfers: Sit to/from Stand, Bed to chair/wheelchair/BSC Sit to Stand: +2 safety/equipment, From elevated surface, +2 physical assistance, Max assist, Mod assist Stand pivot transfers: +2 physical assistance, +2 safety/equipment, Mod assist         General transfer comment: Successful on second attempt to stand. Pt guarded with movement d/t increased pain with WB. mod A +2 with some knee buckling noted. Cues for sequencing and off loading onto rw. fatigued quickly. Pain a limiting factor. EOB>recliner      Balance Overall balance assessment: Needs assistance Sitting-balance support: No upper extremity supported, Feet unsupported Sitting balance-Leahy Scale: Fair     Standing balance support: Bilateral upper extremity supported, During functional activity, Reliant on assistive device for balance Standing balance-Leahy Scale: Zero Standing balance comment: rw and mod A +2 static standing                           ADL either performed or assessed with clinical judgement   ADL Overall ADL's :  Needs assistance/impaired Eating/Feeding: Set up;Sitting   Grooming: Set up;Sitting   Upper Body Bathing: Set up;Sitting;Minimal assistance   Lower Body Bathing: Maximal assistance;+2 for physical  assistance;Sit to/from stand;+2 for safety/equipment   Upper Body Dressing : Set up;Minimal assistance;Sitting   Lower Body Dressing: Maximal assistance;+2 for physical assistance;Sit to/from stand;+2 for safety/equipment   Toilet Transfer: Moderate assistance;+2 for physical assistance;Stand-pivot;BSC/3in1;Rolling walker (2 wheels) Toilet Transfer Details (indicate cue type and reason): simulated with EOB to recliner, going towards pt's left side. elevated seat surfaces. cues for sequencing. Toileting- Clothing Manipulation and Hygiene: Maximal assistance;+2 for physical assistance;+2 for safety/equipment;Sit to/from stand         General ADL Comments: needs supported sitting for UB ADLs     Vision         Perception     Praxis      Pertinent Vitals/Pain Pain Assessment Pain Assessment: Faces Faces Pain Scale: Hurts whole lot Pain Location: Rt hip with weightbearing Pain Descriptors / Indicators: Aching, Grimacing, Guarding, Sore, Moaning Pain Intervention(s): Monitored during session, Limited activity within patient's tolerance, RN gave pain meds during session, Repositioned     Hand Dominance Right   Extremity/Trunk Assessment Upper Extremity Assessment Upper Extremity Assessment: Generalized weakness   Lower Extremity Assessment Lower Extremity Assessment: Defer to PT evaluation   Cervical / Trunk Assessment Cervical / Trunk Assessment: Kyphotic   Communication Communication Communication: HOH   Cognition Arousal/Alertness: Awake/alert Behavior During Therapy: Impulsive, WFL for tasks assessed/performed Overall Cognitive Status: Impaired/Different from baseline Area of Impairment: Orientation, Attention, Memory, Following commands, Safety/judgement, Awareness, Problem solving                 Orientation Level: Disoriented to, Situation Current Attention Level: Sustained Memory: Decreased recall of precautions, Decreased short-term memory Following  Commands: Follows one step commands with increased time, Follows multi-step commands inconsistently, Follows multi-step commands with increased time Safety/Judgement: Decreased awareness of safety, Decreased awareness of deficits Awareness: Emergent Problem Solving: Slow processing, Difficulty sequencing, Requires verbal cues, Requires tactile cues       General Comments  Niece and sister in law(?) present during session.    Exercises     Shoulder Instructions      Home Living Family/patient expects to be discharged to:: Private residence Living Arrangements: Alone (for last week pt's sister staying at night and PCA staying in daytime) Available Help at Discharge: Family;Available PRN/intermittently Type of Home: House Home Access: Ramped entrance     Home Layout: One level     Bathroom Shower/Tub: Occupational psychologist: Standard Bathroom Accessibility: Yes   Home Equipment: Shower seat;Cane - single point;Rollator (4 wheels)          Prior Functioning/Environment Prior Level of Function : Needs assist             Mobility Comments: uses rollator at baseline ADLs Comments: pt has had care attentdent for ~1 week during day but does not have 24/7 care        OT Problem List: Decreased strength;Decreased activity tolerance;Impaired balance (sitting and/or standing);Decreased knowledge of use of DME or AE;Decreased knowledge of precautions;Pain;Decreased cognition      OT Treatment/Interventions: Self-care/ADL training;Therapeutic exercise;Energy conservation;DME and/or AE instruction;Therapeutic activities;Cognitive remediation/compensation;Patient/family education;Balance training    OT Goals(Current goals can be found in the care plan section) Acute Rehab OT Goals Patient Stated Goal: reduce pain OT Goal Formulation: With patient/family Time For Goal Achievement: 03/31/22 Potential to Achieve Goals: Good ADL Goals Pt Will Perform Lower Body  Bathing: with mod assist;sit to/from stand Pt Will Perform Lower Body Dressing: with mod assist;sit to/from stand Pt Will Transfer to Toilet: with mod assist;stand pivot transfer;bedside commode Pt Will Perform Toileting - Clothing Manipulation and hygiene: with mod assist;sitting/lateral leans;sit to/from stand Additional ADL Goal #1: Pt will complete bed mobility at min A level to prepare for EOB/OOB ADLs.  OT Frequency: Min 2X/week    Co-evaluation              AM-PAC OT "6 Clicks" Daily Activity     Outcome Measure Help from another person eating meals?: A Little Help from another person taking care of personal grooming?: A Little Help from another person toileting, which includes using toliet, bedpan, or urinal?: Total Help from another person bathing (including washing, rinsing, drying)?: Total Help from another person to put on and taking off regular upper body clothing?: A Little Help from another person to put on and taking off regular lower body clothing?: Total 6 Click Score: 12   End of Session Equipment Utilized During Treatment: Rolling walker (2 wheels);Gait belt Nurse Communication: Mobility status;Precautions  Activity Tolerance: Patient limited by pain;Patient limited by fatigue;Other (comment) (+ lightheaded in standing, resolved with time and reclining in chair) Patient left: in chair;with call bell/phone within reach;with chair alarm set;with family/visitor present  OT Visit Diagnosis: Unsteadiness on feet (R26.81);Muscle weakness (generalized) (M62.81);Pain;History of falling (Z91.81);Other symptoms and signs involving cognitive function Pain - Right/Left: Right Pain - part of body: Hip;Leg                Time: 1100-1130 OT Time Calculation (min): 30 min Charges:  OT General Charges $OT Visit: 1 Visit OT Evaluation $OT Eval Moderate Complexity: 1 Mod OT Treatments $Self Care/Home Management : 8-22 mins  Tyrone Schimke, OT Acute Rehabilitation  Services Office: [phone]   Hortencia Pilar 03/17/2022, 12:04 PM

## 2022-03-17 NOTE — Progress Notes (Signed)
PROGRESS NOTE    Kim [name]  ONG:[id number] DOB: 02-Apr-1935 DOA: 03/11/2022 PCP: Sueanne Margarita, DO   Brief Narrative:  The patient  is an The patient is an 87 y.o. female with medical history significant of permanent a fib on amiodarone and anticoagulant Eliquis, CAD, HOCM, HFpEF, hypothyroidism as well as other comorbidities who was presenting with right hip pain after a fall found to have right femur fracture patient was admitted Ortho was consulted.She's normally on eliquis, held and started on heparin gtt. last echo 03/03/2021 with EF 60-65% no RWMA, severe focal basal septal left ventricular hypertrophy noted, indeterminate diastolic pressure RV systolic function normal, status post MitraClip x2 with residual mild to moderate mitral regurgitation noted.  Cardiology was consulted for clearance and she was cleared by cardiology and had a right hip repair on 03/15/2022 and now is off the heparin drip discharged back to Eliquis on 03/16/2022.  Yesterday she was pleasantly confused and today she is more awake and complaining of some pain but she extremely hard of hearing.  PT OT evaluated and recommending SNF and currently awaiting placement.  Orthopedic surgery recommending continuing minimal pain medications given avoiding prolonging her confusion.   Assessment and Plan:  Closed right hip fracture post mechanical fall, POA s/p repair on 03/15/2022 by Dr. Alvan Dame:  -Cleared by cardiology for surgery. -Pain control regimen along with bowel regimen. -PT OT assessment under orthopedic surgery's guidance -TOC consulted to assist with SNF placement and bed options presented to the family   Acute Metabolic Encephalopathy in the setting of acute illness and recent surgery -Delirium precautions in place -Regimen as needed -Avoid sedating agents -Regular sleep and wake cycle -Fall precautions   Acute Blood Loss anemia post orthopedic surgery -Drop in hemoglobin/hematocrit from 13.1/40.9 and is now  turned down to 10.3/31.3 and likely drop in the setting of her surgical intervention -Continue to monitor for signs and symptoms of bleeding no other anticoagulation-resumed; no overt bleeding noted -Continue with ferrous gluconate 325 mg p.o. daily with lunch -Check anemia panel in the a.m. -Repeat CBC in a.m.   Hypothyroidism:  -Continue home levothyroxine 137 mcg p.o. daily   Hypertension CAD S/P DES RCA 2015 HOCM Chronic Diastolic CHF: -Blood pressure well controlled.  -Denies any anginal symptoms.  -Continue home cardiac medications with amiodarone 200 mg p.o. daily and apixaban 5 mg p.o. twice daily as well as diltiazem 240 mg p.o. daily -Continue metoprolol succinate 50 mg p.o. daily nightly   History of MR s/p MitraClip March 2020 -Stable on previous echo and showed "S/P prior mitraclip x2. No significant changes compared to prior."   Permanent atrial fibrillation:  -Rate controlled on metoprolol -On amiodarone for rhythm control -Home Eliquis restarted on 03/16/2022 postsurgery. -Continue to Monitor on Telemetry    Breast cancer hx triple positive adenocarcinoma of the left breast -Followed by Dr. Marin Olp and Dr. Sondra Come  -Continue with Exemestane 25 mg p.o. daily with breakfast  AKI on CKD 3A -Baseline creatinine appears to be 1.0 with GFR 53. -She presented with creatinine of 1.38 and GFR of 38. -Now her BUNs/creatinine is slightly trended up and trend has been going from 25/1.01 -> 30/1.04 -> 27/1.03 -> 31/1.26 -> 36/1.32 -Avoid further nephrotoxic medications, contrast dyes, hypotension and dehydration to ensure adequate renal perfusion and renally adjust medications -Repeat CMP in a.m.  Leukocytosis -Patient's WBC went from 5.2 -> 5.8 -> 5.6 -> 8.8 -> 11.5 -Continue to Monitor for S/Sx of Infection -WBC likely elevated in the setting  of Surgical Intervention and Pain -Repeat CBC in the AM    Moderate malnutrition: Nutrition Status: Nutrition Problem: Moderate  Malnutrition Etiology: chronic illness Signs/Symptoms: mild fat depletion, mild muscle depletion Interventions: MVI, Ensure Enlive (each supplement provides 350kcal and 20 grams of protein) Estimated body mass index is 21.69 kg/m as calculated from the following:   Height as of this encounter: '5\' 8"'$  (1.727 m).   Weight as of this encounter: 64.7 kg.  DVT prophylaxis: SCDs Start: 03/15/22 2008 Place TED hose Start: 03/15/22 2008 apixaban (ELIQUIS) tablet 5 mg    Code Status: Full Code Family Communication: Discussed with family at bedside   Disposition Plan:  Level of care: Telemetry Status is: Inpatient Remains inpatient appropriate because: Needs SNF and Insurance Authorization   Consultants:  Orthopedic Surgery Cardiology   Procedures:  Conversion of right hip surgery to right total hip arthroplasty.  Antimicrobials:  Anti-infectives (From admission, onward)    Start     Dose/Rate Route Frequency Ordered Stop   03/16/22 0230  vancomycin (VANCOCIN) IVPB 1000 mg/200 mL premix        1,000 mg 200 mL/hr over 60 Minutes Intravenous Every 12 hours 03/15/22 2008 03/16/22 0248   03/15/22 1615  gentamicin (GARAMYCIN) 70 mg in dextrose 5 % 50 mL IVPB        65 mg 103.5 mL/hr over 30 Minutes Intravenous STAT 03/15/22 1602 03/15/22 1900   03/15/22 1600  gentamicin (GARAMYCIN) IVPB 60 mg  Status:  Discontinued        60 mg 100 mL/hr over 30 Minutes Intravenous STAT 03/15/22 1559 03/15/22 1600   03/15/22 0800  vancomycin (VANCOCIN) IVPB 1000 mg/200 mL premix        1,000 mg 200 mL/hr over 60 Minutes Intravenous On call to O.R. 03/15/22 0701 03/15/22 1630       Subjective: Seen and examined at bedside and she is extremely hard of hearing.  States that she has some pain when she tries to put weight on it.  Denies any chest pain or shortness of breath.  No other concerns or complaints at this time.  PT OT recommending SNF.  Objective: Vitals:   03/16/22 1403 03/16/22 1955 03/17/22  0603 03/17/22 1344  BP: 124/69 107/67 133/66 109/68  Pulse: (!) 101 91 98 86  Resp: '20 15 16 16  '$ Temp:  99.7 F (37.6 C) 98.4 F (36.9 C) 97.9 F (36.6 C)  TempSrc:  Oral Oral Oral  SpO2: 100% 96% 97% 97%  Weight:      Height:        Intake/Output Summary (Last 24 hours) at 03/17/2022 1419 Last data filed at 03/17/2022 1230 Gross per 24 hour  Intake 600 ml  Output --  Net 600 ml   Filed Weights   03/11/22 1912  Weight: 64.7 kg   Examination: Physical Exam:  Constitutional: Elderly chronically ill-appearing Caucasian female who is extremely hard of hearing and in no real acute distress working with therapy Respiratory: Diminished to auscultation bilaterally, no wheezing, rales, rhonchi or crackles. Normal respiratory effort and patient is not tachypenic. No accessory muscle use.  Unlabored breathing Cardiovascular: RRR, has a slight 2 out of 6 murmur. No extremity edema.  Abdomen: Soft, non-tender, non-distended.  Bowel sounds positive.  GU: Deferred. Musculoskeletal: No clubbing / cyanosis of digits/nails. No joint deformity upper and lower extremities.  Skin: No rashes, lesions, ulcers on limited skin evaluation. No induration; Warm and dry.  Neurologic: CN 2-12 grossly intact with no focal deficits  but she is extremely hard of hearing Psychiatric: Normal judgment and insight.  He is awake and alert but not fully oriented  Data Reviewed: I have personally reviewed following labs and imaging studies  CBC: Recent Labs  Lab 03/11/22 1430 03/12/22 0529 03/13/22 0614 03/14/22 0618 03/15/22 0509 03/16/22 0452 03/17/22 0547  WBC 7.2   < > 5.2 5.8 5.6 8.8 11.5*  NEUTROABS 5.4  --   --   --   --   --   --   HGB 13.6   < > 13.6 13.1 13.1 11.1* 10.3*  HCT 42.4   < > 41.1 40.3 40.9 33.6* 31.3*  MCV 103.7*   < > 102.8* 102.0* 103.0* 102.8* 101.3*  PLT 146*   < > 148* 155 153 140* 157   < > = values in this interval not displayed.   Basic Metabolic Panel: Recent Labs  Lab  03/13/22 0614 03/14/22 0618 03/15/22 0509 03/16/22 0452 03/17/22 0547  NA 138 136 138 132* 135  K 4.1 4.5 4.3 5.0 4.7  CL 104 104 103 100 103  CO2 '27 25 27 24 24  '$ GLUCOSE 94 96 95 170* 137*  BUN 25* 30* 27* 31* 36*  CREATININE 1.01* 1.04* 1.03* 1.26* 1.32*  CALCIUM 9.2 9.2 9.4 8.6* 9.4  MG  --   --   --   --  2.4  PHOS  --   --   --   --  3.7   GFR: Estimated Creatinine Clearance: 31.4 mL/min (A) (by C-G formula based on SCr of 1.32 mg/dL (H)). Liver Function Tests: Recent Labs  Lab 03/14/22 0618 03/15/22 0509 03/17/22 0547  AST 14* 13* 16  ALT '14 12 17  '$ ALKPHOS 63 66 53  BILITOT 0.9 1.1 0.9  PROT 6.2* 6.4* 5.9*  ALBUMIN 3.2* 3.3* 3.0*   No results for input(s): "LIPASE", "AMYLASE" in the last 168 hours. No results for input(s): "AMMONIA" in the last 168 hours. Coagulation Profile: Recent Labs  Lab 03/11/22 1808  INR 1.3*   Cardiac Enzymes: No results for input(s): "CKTOTAL", "CKMB", "CKMBINDEX", "TROPONINI" in the last 168 hours. BNP (last 3 results) No results for input(s): "PROBNP" in the last 8760 hours. HbA1C: No results for input(s): "HGBA1C" in the last 72 hours. CBG: Recent Labs  Lab 03/16/22 2104  GLUCAP 160*   Lipid Profile: No results for input(s): "CHOL", "HDL", "LDLCALC", "TRIG", "CHOLHDL", "LDLDIRECT" in the last 72 hours. Thyroid Function Tests: No results for input(s): "TSH", "T4TOTAL", "FREET4", "T3FREE", "THYROIDAB" in the last 72 hours. Anemia Panel: No results for input(s): "VITAMINB12", "FOLATE", "FERRITIN", "TIBC", "IRON", "RETICCTPCT" in the last 72 hours. Sepsis Labs: No results for input(s): "PROCALCITON", "LATICACIDVEN" in the last 168 hours.  No results found for this or any previous visit (from the past 240 hour(s)).   Radiology Studies: DG Pelvis Portable  Result Date: 03/15/2022 CLINICAL DATA:  Postop right hip arthroplasty conversion EXAM: PORTABLE PELVIS 1-2 VIEWS COMPARISON:  08/22/2021 FINDINGS: Status post right hip  total arthroplasty about persistently displaced, nonacute fractures of the intratrochanteric right femur with evidence of prior osteotomy. Expected overlying postoperative change. No perihardware fracture or component malpositioning. IMPRESSION: Status post right hip total arthroplasty about persistently displaced, nonacute fractures of the intratrochanteric right femur with evidence of prior osteotomy. No evidence of acute perihardware fracture or component malpositioning. Electronically Signed   By: Delanna Ahmadi M.D.   On: 03/15/2022 18:59     Scheduled Meds:  amiodarone  200 mg Oral Daily  apixaban  5 mg Oral BID   diltiazem  240 mg Oral Daily   docusate sodium  100 mg Oral BID   exemestane  25 mg Oral QPC breakfast   feeding supplement  237 mL Oral BID BM   ferrous gluconate  324 mg Oral Q lunch   gabapentin  100 mg Oral QODAY   levothyroxine  137 mcg Oral QAC breakfast   melatonin  5 mg Oral QHS   metoprolol succinate  50 mg Oral QHS   multivitamin with minerals  1 tablet Oral Daily   vitamin B-12  1,000 mcg Oral Daily   Continuous Infusions:  methocarbamol (ROBAXIN) IV      LOS: 6 days   Raiford Noble, DO [facility] Available via Epic secure chat 7am-7pm After these hours, please refer to coverage provider listed on [email or web address] 03/17/2022, 2:19 PM

## 2022-03-17 NOTE — TOC Progression Note (Addendum)
Transition of Care [facility]) - Progression Note    Patient Details  Name: Kim [name] MRN: [id number] Date of Birth: 08-06-1935  [facility] CM/SW [place], LCSW Phone Number: 03/17/2022, 9:06 AM  Clinical Narrative:    CSW spoke with pt's daughter, Aris [place] ([phone]) to review SNF bed offers. Pt's daughter plans to visit SNF facilities prior to making a decision on bed offer.   1520: CSW spoke with patients daughter who shared that [facility] their top choice for SNF placement. CSW confirmed bed offer still available for this pt at [facility]. Insurance authorization has been started and ID# is [id number].       Expected Discharge Plan and Services                                                 Social Determinants of Health (SDOH) Interventions    Readmission Risk Interventions     No data to display

## 2022-03-17 NOTE — Progress Notes (Signed)
Pt confused throughout night  refused hs/am meds

## 2022-03-18 DIAGNOSIS — I10 Essential (primary) hypertension: Secondary | ICD-10-CM | POA: Diagnosis not present

## 2022-03-18 DIAGNOSIS — E440 Moderate protein-calorie malnutrition: Secondary | ICD-10-CM | POA: Diagnosis not present

## 2022-03-18 DIAGNOSIS — I5032 Chronic diastolic (congestive) heart failure: Secondary | ICD-10-CM | POA: Diagnosis not present

## 2022-03-18 DIAGNOSIS — E039 Hypothyroidism, unspecified: Secondary | ICD-10-CM | POA: Diagnosis not present

## 2022-03-18 LAB — CBC WITH DIFFERENTIAL/PLATELET
Abs Immature Granulocytes: 0.06 10*3/uL (ref 0.00–0.07)
Basophils Absolute: 0 10*3/uL (ref 0.0–0.1)
Basophils Relative: 0 %
Eosinophils Absolute: 0 10*3/uL (ref 0.0–0.5)
Eosinophils Relative: 0 %
HCT: 28.2 % — ABNORMAL LOW (ref 36.0–46.0)
Hemoglobin: 9.3 g/dL — ABNORMAL LOW (ref 12.0–15.0)
Immature Granulocytes: 1 %
Lymphocytes Relative: 4 %
Lymphs Abs: 0.4 10*3/uL — ABNORMAL LOW (ref 0.7–4.0)
MCH: 34.4 pg — ABNORMAL HIGH (ref 26.0–34.0)
MCHC: 33 g/dL (ref 30.0–36.0)
MCV: 104.4 fL — ABNORMAL HIGH (ref 80.0–100.0)
Monocytes Absolute: 0.4 10*3/uL (ref 0.1–1.0)
Monocytes Relative: 5 %
Neutro Abs: 8.4 10*3/uL — ABNORMAL HIGH (ref 1.7–7.7)
Neutrophils Relative %: 90 %
Platelets: 148 10*3/uL — ABNORMAL LOW (ref 150–400)
RBC: 2.7 MIL/uL — ABNORMAL LOW (ref 3.87–5.11)
RDW: 13 % (ref 11.5–15.5)
WBC: 9.3 10*3/uL (ref 4.0–10.5)
nRBC: 0 % (ref 0.0–0.2)

## 2022-03-18 LAB — PHOSPHORUS: Phosphorus: 3.9 mg/dL (ref 2.5–4.6)

## 2022-03-18 LAB — COMPREHENSIVE METABOLIC PANEL
ALT: 16 U/L (ref 0–44)
AST: 12 U/L — ABNORMAL LOW (ref 15–41)
Albumin: 2.7 g/dL — ABNORMAL LOW (ref 3.5–5.0)
Alkaline Phosphatase: 44 U/L (ref 38–126)
Anion gap: 7 (ref 5–15)
BUN: 40 mg/dL — ABNORMAL HIGH (ref 8–23)
CO2: 25 mmol/L (ref 22–32)
Calcium: 8.4 mg/dL — ABNORMAL LOW (ref 8.9–10.3)
Chloride: 102 mmol/L (ref 98–111)
Creatinine, Ser: 1.22 mg/dL — ABNORMAL HIGH (ref 0.44–1.00)
GFR, Estimated: 43 mL/min — ABNORMAL LOW (ref >60)
Glucose, Bld: 141 mg/dL — ABNORMAL HIGH (ref 70–99)
Potassium: 4.8 mmol/L (ref 3.5–5.1)
Sodium: 134 mmol/L — ABNORMAL LOW (ref 135–145)
Total Bilirubin: 0.6 mg/dL (ref 0.3–1.2)
Total Protein: 5.5 g/dL — ABNORMAL LOW (ref 6.5–8.1)

## 2022-03-18 LAB — MAGNESIUM: Magnesium: 2 mg/dL (ref 1.7–2.4)

## 2022-03-18 MED ORDER — MENTHOL 3 MG MT LOZG: 1.0000 | LOZENGE | OROMUCOSAL | 12 refills | Status: AC | PRN

## 2022-03-18 MED ORDER — POLYETHYLENE GLYCOL 3350 17 G PO PACK: 17.0000 g | PACK | Freq: Every day | ORAL | 0 refills | Status: AC | PRN

## 2022-03-18 MED ORDER — ENSURE ENLIVE PO LIQD: 237.0000 mL | Freq: Two times a day (BID) | ORAL | 12 refills | Status: AC

## 2022-03-18 MED ORDER — ONDANSETRON HCL 4 MG PO TABS: 4.0000 mg | ORAL_TABLET | Freq: Four times a day (QID) | ORAL | 0 refills | Status: AC | PRN

## 2022-03-18 NOTE — Discharge Summary (Signed)
Physician Discharge Summary   Patient: Kim [name] MRN: [id number] DOB: 08-14-1935  Admit date:     03/11/2022  Discharge date: 03/18/22  Discharge Physician: Raiford Noble, DO   PCP: Sueanne Margarita, DO   Recommendations at discharge:   Follow-up with PCP within 1 to 2 weeks and repeat CBC, CMP, mag, Phos within 1 week Follow-up with orthopedic surgery within 1 to 2 weeks and continue weightbearing as tolerated Follow-up with cardiology within 1 to 2 weeks after discharge  Discharge Diagnoses: Principal Problem:   Closed right hip fracture ([place]) Active Problems:   Hypothyroidism   Hypertension   CAD (coronary artery disease)   HOCM (hypertrophic obstructive cardiomyopathy) (HCC)   Breast cancer (HCC)   Permanent atrial fibrillation (HCC)   Chronic diastolic CHF (congestive heart failure) (HCC)   Stage 3a chronic kidney disease (CKD) ([place])   Malnutrition of moderate degree   Preoperative cardiovascular examination   S/P total right hip arthroplasty  Resolved Problems:   * No resolved hospital problems. [facility] Hospital Course: The patient is an 87 y.o. female with medical history significant of permanent a fib on amiodarone and anticoagulant Eliquis, CAD, HOCM, HFpEF, hypothyroidism as well as other comorbidities who was presenting with right hip pain after a fall found to have right femur fracture patient was admitted Ortho was consulted.She's normally on eliquis, held and started on heparin gtt. last echo 03/03/2021 with EF 60-65% no RWMA, severe focal basal septal left ventricular hypertrophy noted, indeterminate diastolic pressure RV systolic function normal, status post MitraClip x2 with residual mild to moderate mitral regurgitation noted.  Cardiology was consulted for clearance and she was cleared by cardiology and had a right hip repair on 03/15/2022 and now is off the heparin drip discharged back to Eliquis on 03/16/2022.  Yesterday she was pleasantly confused and today she is  more awake and complaining of some pain but she extremely hard of hearing.  PT OT evaluated and recommending SNF and currently awaiting placement.  Orthopedic surgery recommending continuing minimal pain medications given avoiding prolonging her confusion.  She is improved and renal function is improved as well.  She is medically stable for discharging to SNF to continue rehab and will need to follow-up with PCP within 1 to 2 weeks as well as orthopedic surgery and cardiology in outpatient setting.  Assessment and Plan:  Closed right hip fracture post mechanical fall, POA s/p repair on 03/15/2022 by Dr. Alvan Dame:  -Cleared by cardiology for surgery. -Pain control regimen along with bowel regimen. -PT OT assessment under orthopedic surgery's guidance -TOC consulted to assist with SNF placement and bed options presented to the family -Weightbearing as tolerated and bowel regimen per orthopedic surgery as well as pain control -Resumed anticoagulation with Eliquis    Acute Metabolic Encephalopathy in the setting of acute illness and recent surgery -Delirium precautions in place -Regimen as needed -Avoid sedating agents -Regular sleep and wake cycle -Fall precautions -Her encephalopathy is improving and she is doing better   Acute Blood Loss anemia post orthopedic surgery with superimposed macrocytosis -Drop in hemoglobin/hematocrit from 13.1/40.9 and is now turned down to 10.3/31.3 yesterday and today is now 9.3/28.2 and likely drop in the setting of her surgical intervention -Continue to monitor for signs and symptoms of bleeding no other anticoagulation-resumed; no overt bleeding noted -Continue with ferrous gluconate 325 mg p.o. daily with lunch -Check anemia panel in the outpatient setting -Repeat CBC within 1 to 2 weeks   Hypothyroidism:  -Continue home levothyroxine  137 mcg p.o. daily   Hypertension CAD S/P DES RCA 2015 HOCM Chronic Diastolic CHF: -Blood pressure well controlled.   -Denies any anginal symptoms.  -Continue home cardiac medications with amiodarone 200 mg p.o. daily and apixaban 5 mg p.o. twice daily as well as diltiazem 240 mg p.o. daily -Continue metoprolol succinate 50 mg p.o. daily nightly -Follow-up with cardiology in outpatient setting   History of MR s/p MitraClip March 2020 -Stable on previous echo and showed "S/P prior mitraclip x2. No significant changes compared to prior."   Permanent Atrial Fibrillation:  -Rate controlled on metoprolol -On amiodarone for rhythm control -Home Eliquis restarted on 03/16/2022 postsurgery. -Continue to Monitor on Telemetry   Hyponatremia -Patient's sodium went from 136 -> 138 -> 132 -> 135 -> 134 -Continue to monitor trend and repeat CMP in outpatient setting   Breast cancer hx triple positive adenocarcinoma of the left breast -Followed by Dr. Marin Olp and Dr. Sondra Come and can follow up at D/C -Continue with Exemestane 25 mg p.o. daily with breakfast  Hypoalbuminemia -Patient's albumin level trended down and went from 3.2 -> 3.3 -> 3.0 -> 2.7 -Continue to Monitor and Trend and repeat CMP within 1 week   AKI on CKD 3A -Baseline creatinine appears to be 1.0 with GFR 53. -She presented with creatinine of 1.38 and GFR of 38. -Now her BUNs/creatinine is slightly trended up and trend has been going from 25/1.01 -> 30/1.04 -> 27/1.03 -> 31/1.26 -> 36/1.32 -> 40/1.22 -Avoid further nephrotoxic medications, contrast dyes, hypotension and dehydration to ensure adequate renal perfusion and renally adjust medications -Repeat CMP in a.m.   Leukocytosis -Patient's WBC went from 5.2 -> 5.8 -> 5.6 -> 8.8 -> 11.5 -> 9.3 -Continue to Monitor for S/Sx of Infection -WBC likely elevated in the setting of Surgical Intervention and Pain -Repeat CBC within 1 week  Thrombocytopenia -Mild.  Patient's platelet count went from 155 -> 153 -> 140 -> 157 -> 148 -Continue to monitor for signs and symptoms of bleeding and continue  to monitor and trend and repeat CBC within 1 week  Moderate malnutrition: Nutrition Status: Nutrition Problem: Moderate Malnutrition Etiology: chronic illness Signs/Symptoms: mild fat depletion, mild muscle depletion Interventions: MVI, Ensure Enlive (each supplement provides 350kcal and 20 grams of protein) Estimated body mass index is 21.69 kg/m as calculated from the following:   Height as of this encounter: '5\' 8"'$  (1.727 m).   Weight as of this encounter: 64.7 kg.  Nutrition Documentation    [place] [facility] to Hosp-Admission (Current) from 03/11/2022 in [facility] MEDICAL UNIT  Nutrition Problem Moderate Malnutrition  Etiology chronic illness  Nutrition Goal Patient will meet greater than or equal to 90% of their needs  Interventions MVI, Ensure Enlive (each supplement provides 350kcal and 20 grams of protein)      Consultants: Orthopedic Surgery; Cardiology  Procedures performed: Conversion of right hip surgery to right total hip arthroplasty.  ECHOCARDIOGRAM IMPRESSIONS     1. Left ventricular ejection fraction, by estimation, is 60 to 65%. The  left ventricle has normal function. The left ventricle has no regional  wall motion abnormalities. There is severe left ventricular hypertrophy of  the basal-septal segment. Left  ventricular diastolic function could not be evaluated.   2. Right ventricular systolic function is normal. The right ventricular  size is normal. There is normal pulmonary artery systolic pressure.   3. Left atrial size was severely dilated.   4. The mitral valve has been repaired/replaced.  Mild mitral valve  regurgitation. There is a Mitra-Clip present in the mitral position.   5. The aortic valve is tricuspid. There is mild calcification of the  aortic valve. There is mild thickening of the aortic valve. Aortic valve  regurgitation is trivial. Aortic valve sclerosis/calcification is present,  without any evidence of aortic   stenosis.   6. The inferior vena cava is normal in size with greater than 50%  respiratory variability, suggesting right atrial pressure of 3 mmHg.   Comparison(s): No significant change from prior study.   Conclusion(s)/Recommendation(s): S/P prior mitraclip x2. No significant  changes compared to prior.   FINDINGS   Left Ventricle: High velocity flow seen in LVOT, but gradient not  measured. Left ventricular ejection fraction, by estimation, is 60 to 65%.  The left ventricle has normal function. The left ventricle has no regional  wall motion abnormalities. The left  ventricular internal cavity size was normal in size. There is severe left  ventricular hypertrophy of the basal-septal segment. Left ventricular  diastolic function could not be evaluated.   Right Ventricle: The right ventricular size is normal. No increase in  right ventricular wall thickness. Right ventricular systolic function is  normal. There is normal pulmonary artery systolic pressure. The tricuspid  regurgitant velocity is 2.40 m/s, and   with an assumed right atrial pressure of 3 mmHg, the estimated right  ventricular systolic pressure is 91.6 mmHg.   Left Atrium: Left atrial size was severely dilated.   Right Atrium: Right atrial size was normal in size.   Pericardium: Trivial pericardial effusion is present.   Mitral Valve: The mitral valve has been repaired/replaced. Mild mitral  valve regurgitation. There is a Mitra-Clip present in the mitral position.   Tricuspid Valve: The tricuspid valve is normal in structure. Tricuspid  valve regurgitation is mild . No evidence of tricuspid stenosis.   Aortic Valve: The aortic valve is tricuspid. There is mild calcification  of the aortic valve. There is mild thickening of the aortic valve. Aortic  valve regurgitation is trivial. Aortic valve sclerosis/calcification is  present, without any evidence of  aortic stenosis. Aortic valve peak gradient measures  7.4 mmHg.   Pulmonic Valve: The pulmonic valve was not well visualized. Pulmonic valve  regurgitation is trivial.   Aorta: The aortic root, ascending aorta, aortic arch and descending aorta  are all structurally normal, with no evidence of dilitation or  obstruction.   Venous: The inferior vena cava is normal in size with greater than 50%  respiratory variability, suggesting right atrial pressure of 3 mmHg.   IAS/Shunts: The atrial septum is grossly normal.      LEFT VENTRICLE  PLAX 2D  LVIDd:         3.50 cm  LVIDs:         2.10 cm  LV PW:         1.40 cm  LV IVS:        2.20 cm  LVOT diam:     1.80 cm  LV SV:         56  LV SV Index:   32  LVOT Area:     2.54 cm      RIGHT VENTRICLE          IVC  RV Basal diam:  3.10 cm  IVC diam: 2.00 cm   LEFT ATRIUM              Index  RIGHT ATRIUM           Index  LA diam:        4.80 cm  2.71 cm/m   RA Area:     19.30 cm  LA Vol (A2C):   119.0 ml 67.22 ml/m  RA Volume:   48.20 ml  27.23 ml/m  LA Vol (A4C):   125.0 ml 70.61 ml/m  LA Biplane Vol: 123.0 ml 69.48 ml/m   AORTIC VALVE                 PULMONIC VALVE  AV Area (Vmax): 2.35 cm     PV Vmax:       0.96 m/s  AV Vmax:        136.00 cm/s  PV Peak grad:  3.7 mmHg  AV Peak Grad:   7.4 mmHg  LVOT Vmax:      125.50 cm/s  LVOT Vmean:     78.000 cm/s  LVOT VTI:       0.220 m     AORTA  Ao Root diam: 3.20 cm  Ao Asc diam:  3.60 cm   TRICUSPID VALVE  TR Peak grad:   23.0 mmHg  TR Vmax:        240.00 cm/s     SHUNTS  Systemic VTI:  0.22 m  Systemic Diam: 1.80 cm   Disposition: Skilled nursing facility Diet recommendation:  Cardiac diet DISCHARGE MEDICATION: Allergies as of 03/18/2022       Reactions   Penicillins Anaphylaxis, Swelling   FACIAL SWELLING PATIENT HAS HAD A PCN REACTION WITH IMMEDIATE RASH, FACIAL/TONGUE/THROAT SWELLING, SOB, OR LIGHTHEADEDNESS WITH HYPOTENSION:  #  #  YES  #  #  Has patient had a PCN reaction causing severe rash involving  mucus membranes or skin necrosis: No Has patient had a PCN reaction that required hospitalization: No Has patient had a PCN reaction occurring within the last 10 years: No   Propoxyphene N-acetaminophen Swelling   tongue swelling   Levaquin [levofloxacin] Hives   Repatha [evolocumab] Other (See Comments)   Myalgias, fatigue, some itching and burning on her feet,  Arthralgia (Joint Pain)   Statins Other (See Comments)   Joint pain   Cortisone Rash   Cymbalta [duloxetine Hcl] Itching, Rash   Myrbetriq [mirabegron] Rash   Rash on face   Pseudoephedrine Other (See Comments)   Facial flushing        Medication List     STOP taking these medications    methocarbamol 500 MG tablet Commonly known as: ROBAXIN   potassium chloride 10 MEQ tablet Commonly known as: KLOR-CON   traMADol 50 MG tablet Commonly known as: ULTRAM       TAKE these medications    acetaminophen 500 MG tablet Commonly known as: TYLENOL Take 1,000 mg by mouth 2 (two) times daily.   amiodarone 200 MG tablet Commonly known as: PACERONE Take 1 tablet (200 mg total) by mouth daily.   diclofenac Sodium 1 % Gel Commonly known as: VOLTAREN Apply 4 g topically 2 (two) times daily as needed (knee pain). What changed: Another medication with the same name was added. Make sure you understand how and when to take each.   diclofenac Sodium 1 % Gel Commonly known as: VOLTAREN Apply 4 g topically 4 (four) times daily. What changed: You were already taking a medication with the same name, and this prescription was added. Make sure you understand how and when to take each.   diltiazem 240  MG 24 hr capsule Commonly known as: CARDIZEM CD Take 1 capsule (240 mg total) by mouth daily.   Eliquis 5 MG Tabs tablet Generic drug: apixaban TAKE 1 TABLET BY MOUTH TWICE (2) DAILY What changed: See the new instructions.   exemestane 25 MG tablet Commonly known as: AROMASIN TAKE 1 TABLET BY MOUTH ONCE DAILY AFTER  BREAKFAST What changed: See the new instructions.   feeding supplement Liqd Take 237 mLs by mouth 2 (two) times daily between meals.   ferrous gluconate 324 MG tablet Commonly known as: FERGON Take 324 mg by mouth daily with lunch.   furosemide 40 MG tablet Commonly known as: LASIX TAKE 1 TABLET BY MOUTH AS NEEDED FOR FLUID OR EDEMA. . What changed:  See the new instructions. Another medication with the same name was removed. Continue taking this medication, and follow the directions you see here.   gabapentin 100 MG capsule Commonly known as: NEURONTIN Take 100 mg by mouth every other day.   HYDROcodone-acetaminophen 5-325 MG tablet Commonly known as: NORCO/VICODIN Take 1 tablet by mouth every 8 (eight) hours as needed for severe pain.   levothyroxine 137 MCG tablet Commonly known as: SYNTHROID Take 137 mcg by mouth daily before breakfast.   menthol-cetylpyridinium 3 MG lozenge Commonly known as: CEPACOL Take 1 lozenge (3 mg total) by mouth as needed for sore throat.   methylPREDNISolone 4 MG Tbpk tablet Commonly known as: MEDROL DOSEPAK Day 1: '8mg'$  before breakfast, 4 mg after lunch, 4 mg after supper, and 8 mg at bedtime Day 2: 4 mg before breakfast, 4 mg after lunch, 4 mg  after supper, and 8 mg  at bedtime Day 3:  4 mg  before breakfast, 4 mg  after lunch, 4 mg after supper, and 4 mg  at bedtime Day 4: 4 mg  before breakfast, 4 mg  after lunch, and 4 mg at bedtime Day 5: 4 mg  before breakfast and 4 mg at bedtime Day 6: 4 mg  before breakfast   metoprolol succinate 50 MG 24 hr tablet Commonly known as: TOPROL-XL Take 50 mg by mouth at bedtime.   multivitamin with minerals Tabs tablet Take 1 tablet by mouth daily.   nitroGLYCERIN 0.4 MG SL tablet Commonly known as: Nitrostat Place 1 tablet (0.4 mg total) under the tongue every 5 (five) minutes as needed for chest pain.   ondansetron 4 MG tablet Commonly known as: ZOFRAN Take 1 tablet (4 mg total) by mouth every 6  (six) hours as needed for nausea.   polyethylene glycol 17 g packet Commonly known as: MIRALAX / GLYCOLAX Take 17 g by mouth daily as needed for mild constipation. What changed:  when to take this reasons to take this   potassium chloride SA 20 MEQ tablet Commonly known as: KLOR-CON M TAKE 1/2 TO 1 TABLET (10-20 MEQ TOTAL) BY MOUTH AS NEEDED. ONLY TAKE 1 FULL TABLET (20 MEQ) IF YOU TAKE LASIX. What changed: See the new instructions.   senna-docusate 8.6-50 MG tablet Commonly known as: Senokot-S Take 1 tablet by mouth at bedtime as needed for mild constipation.   vitamin B-12 1000 MCG tablet Commonly known as: CYANOCOBALAMIN Take 1,000 mcg by mouth daily.               Discharge Care Instructions  (From admission, onward)           Start     Ordered   03/18/22 0000  Discharge wound care:  Comments: Reinforce Dressing as needed   03/18/22 1247            Contact information for follow-up providers     Sueanne Margarita, DO. Call in 1 day.   Specialty: Internal Medicine Why: discuss your visit, see when they want to see you in the office Contact information: [place] [address] [phone]         Paralee Cancel, MD. Schedule an appointment as soon as possible for a visit in 2 week(s).   Specialty: Orthopedic Surgery Contact information: [address] [place] [address] [phone]              Contact information for after-discharge care     Destination     [facility]erred [facility] .   Service: Skilled Nursing Contact information: [address] [place] [phone]                    Discharge Exam: Danley Danker Weights   03/11/22 1912  Weight: 64.7 kg   Vitals:   03/18/22 0547 03/18/22 0921  BP: 102/68 110/62  Pulse: 78 76  Resp: 16   Temp: 98 F (36.7 C)   SpO2: 98%    Examination: Physical Exam:  Constitutional: Thin Caucasian female currently  no acute distress appears calm and relatively comfortable Respiratory: Diminished to auscultation bilaterally, no wheezing, rales, rhonchi or crackles. Normal respiratory effort and patient is not tachypenic. No accessory muscle use.  Unlabored breathing Cardiovascular: RRR, no murmurs / rubs / gallops. S1 and S2 auscultated.   Abdomen: Soft, non-tender, non-distended. Bowel sounds positive.  GU: Deferred. Musculoskeletal: No clubbing / cyanosis of digits/nails. No joint deformity upper and lower extremities.  Neurologic: CN 2-12 grossly intact with no focal deficits but she is extremely hard of hearing .Romberg sign and cerebellar reflexes not assessed.  Psychiatric: Normal judgment and insight.  She is awake and alert.  Condition at discharge: stable  The results of significant diagnostics from this hospitalization (including imaging, microbiology, ancillary and laboratory) are listed below for reference.   Imaging Studies: DG Pelvis Portable  Result Date: 03/15/2022 CLINICAL DATA:  Postop right hip arthroplasty conversion EXAM: PORTABLE PELVIS 1-2 VIEWS COMPARISON:  08/22/2021 FINDINGS: Status post right hip total arthroplasty about persistently displaced, nonacute fractures of the intratrochanteric right femur with evidence of prior osteotomy. Expected overlying postoperative change. No perihardware fracture or component malpositioning. IMPRESSION: Status post right hip total arthroplasty about persistently displaced, nonacute fractures of the intratrochanteric right femur with evidence of prior osteotomy. No evidence of acute perihardware fracture or component malpositioning. Electronically Signed   By: Delanna Ahmadi M.D.   On: 03/15/2022 18:59   ECHOCARDIOGRAM COMPLETE  Result Date: 03/13/2022    ECHOCARDIOGRAM REPORT   Patient Name:   ALIZANDRA LOH Date of Exam: 03/13/2022 Medical Rec #:  [id number]       Height:       68.0 in Accession #:    [id number]      Weight:       142.6 lb Date of  Birth:  1934-11-30       BSA:          1.770 m Patient Age:    87 years        BP:           150/92 mmHg Patient Gender: F               HR:  75 bpm. Exam Location:  Inpatient Procedure: 2D Echo, Cardiac Doppler and Color Doppler Indications:    Preoperative evaluation  History:        Patient has prior history of Echocardiogram examinations, most                 recent 03/03/2021. CHF, CAD, Arrythmias:Atrial Fibrillation; Risk                 Factors:Hypertension.                  Mitral Valve: Mitra-Clip valve is present in the mitral                 position.  Sonographer:    Jefferey Pica Referring Phys: [id number] [place]  1. Left ventricular ejection fraction, by estimation, is 60 to 65%. The left ventricle has normal function. The left ventricle has no regional wall motion abnormalities. There is severe left ventricular hypertrophy of the basal-septal segment. Left ventricular diastolic function could not be evaluated.  2. Right ventricular systolic function is normal. The right ventricular size is normal. There is normal pulmonary artery systolic pressure.  3. Left atrial size was severely dilated.  4. The mitral valve has been repaired/replaced. Mild mitral valve regurgitation. There is a Mitra-Clip present in the mitral position.  5. The aortic valve is tricuspid. There is mild calcification of the aortic valve. There is mild thickening of the aortic valve. Aortic valve regurgitation is trivial. Aortic valve sclerosis/calcification is present, without any evidence of aortic stenosis.  6. The inferior vena cava is normal in size with greater than 50% respiratory variability, suggesting right atrial pressure of 3 mmHg. Comparison(s): No significant change from prior study. Conclusion(s)/Recommendation(s): S/P prior mitraclip x2. No significant changes compared to prior. FINDINGS  Left Ventricle: High velocity flow seen in LVOT, but gradient not measured. Left ventricular  ejection fraction, by estimation, is 60 to 65%. The left ventricle has normal function. The left ventricle has no regional wall motion abnormalities. The left ventricular internal cavity size was normal in size. There is severe left ventricular hypertrophy of the basal-septal segment. Left ventricular diastolic function could not be evaluated. Right Ventricle: The right ventricular size is normal. No increase in right ventricular wall thickness. Right ventricular systolic function is normal. There is normal pulmonary artery systolic pressure. The tricuspid regurgitant velocity is 2.40 m/s, and  with an assumed right atrial pressure of 3 mmHg, the estimated right ventricular systolic pressure is 91.4 mmHg. Left Atrium: Left atrial size was severely dilated. Right Atrium: Right atrial size was normal in size. Pericardium: Trivial pericardial effusion is present. Mitral Valve: The mitral valve has been repaired/replaced. Mild mitral valve regurgitation. There is a Mitra-Clip present in the mitral position. Tricuspid Valve: The tricuspid valve is normal in structure. Tricuspid valve regurgitation is mild . No evidence of tricuspid stenosis. Aortic Valve: The aortic valve is tricuspid. There is mild calcification of the aortic valve. There is mild thickening of the aortic valve. Aortic valve regurgitation is trivial. Aortic valve sclerosis/calcification is present, without any evidence of aortic stenosis. Aortic valve peak gradient measures 7.4 mmHg. Pulmonic Valve: The pulmonic valve was not well visualized. Pulmonic valve regurgitation is trivial. Aorta: The aortic root, ascending aorta, aortic arch and descending aorta are all structurally normal, with no evidence of dilitation or obstruction. Venous: The inferior vena cava is normal in size with greater than 50% respiratory variability, suggesting right atrial pressure of 3 mmHg.  IAS/Shunts: The atrial septum is grossly normal.  LEFT VENTRICLE PLAX 2D LVIDd:          3.50 cm LVIDs:         2.10 cm LV PW:         1.40 cm LV IVS:        2.20 cm LVOT diam:     1.80 cm LV SV:         56 LV SV Index:   32 LVOT Area:     2.54 cm  RIGHT VENTRICLE          IVC RV Basal diam:  3.10 cm  IVC diam: 2.00 cm LEFT ATRIUM              Index        RIGHT ATRIUM           Index LA diam:        4.80 cm  2.71 cm/m   RA Area:     19.30 cm LA Vol (A2C):   119.0 ml 67.22 ml/m  RA Volume:   48.20 ml  27.23 ml/m LA Vol (A4C):   125.0 ml 70.61 ml/m LA Biplane Vol: 123.0 ml 69.48 ml/m  AORTIC VALVE                 PULMONIC VALVE AV Area (Vmax): 2.35 cm     PV Vmax:       0.96 m/s AV Vmax:        136.00 cm/s  PV Peak grad:  3.7 mmHg AV Peak Grad:   7.4 mmHg LVOT Vmax:      125.50 cm/s LVOT Vmean:     78.000 cm/s LVOT VTI:       0.220 m  AORTA Ao Root diam: 3.20 cm Ao Asc diam:  3.60 cm TRICUSPID VALVE TR Peak grad:   23.0 mmHg TR Vmax:        240.00 cm/s  SHUNTS Systemic VTI:  0.22 m Systemic Diam: 1.80 cm Buford Dresser MD Electronically signed by Buford Dresser MD Signature Date/Time: 03/13/2022/11:00:47 AM    Final    DG Chest Port 1 View  Result Date: 03/11/2022 CLINICAL DATA:  Preop evaluation EXAM: PORTABLE CHEST 1 VIEW COMPARISON:  01/23/2021 FINDINGS: Transverse diameter of heart is increased. There are no signs of pulmonary edema or focal pulmonary consolidation. There is no pleural effusion or pneumothorax. Surgical clips are seen in the left chest wall. IMPRESSION: Cardiomegaly. There are no signs of pulmonary edema or focal pulmonary consolidation. Electronically Signed   By: Elmer Picker M.D.   On: 03/11/2022 14:20   CT Lumbar Spine Wo Contrast  Result Date: 03/11/2022 CLINICAL DATA:  Fall EXAM: CT LUMBAR SPINE WITHOUT CONTRAST TECHNIQUE: Multidetector CT imaging of the lumbar spine was performed without intravenous contrast administration. Multiplanar CT image reconstructions were also generated. RADIATION DOSE REDUCTION: This exam was performed according to  the departmental dose-optimization program which includes automated exposure control, adjustment of the mA and/or kV according to patient size and/or use of iterative reconstruction technique. COMPARISON:  02/03/2018 CT lumbar spine, correlation is also made with 09/21/2019 MRI lumbar spine FINDINGS: Segmentation: 5 lumbar type vertebrae. Alignment: Trace anterolisthesis L4 on L5, unchanged. Mild dextrocurvature. Mild straightening of the normal lumbar lordosis. Vertebrae: No acute fracture or suspicious osseous lesion. Paraspinal and other soft tissues: No acute finding. Aortic atherosclerosis. Low-density lesions in the kidneys, possibly renal cysts. Disc levels: Multilevel degenerative changes, which appear grossly unchanged compared to  the 09/21/2019 MRI, with disc height loss most prominently at L3-L4, L4-L5, and L5-S1. The previously noted L4-L5 disc extrusion with cranial extension is not well evaluated on this study. IMPRESSION: No acute fracture or traumatic listhesis in the lumbar spine. Electronically Signed   By: Merilyn Baba M.D.   On: 03/11/2022 11:38   DG Hip Unilat W or Wo Pelvis 2-3 Views Right  Result Date: 03/11/2022 CLINICAL DATA:  [place] on Monday EXAM: DG HIP (WITH OR WITHOUT PELVIS) 2-3V RIGHT COMPARISON:  11/28/2021 FINDINGS: IM nail with compression screw at proximal RIGHT femur post ORIF of intertrochanteric fracture. Osseous demineralization. Penetration of compression screw through femoral head into acetabulum new since prior exam. Heterotopic bone adjacent to the medial aspect of the proximal RIGHT femoral diaphysis. Hardware appears intact. Mild narrowing of hip joints bilaterally. No new fracture, dislocation, or bone destruction. IMPRESSION: Prior ORIF of intertrochanteric fracture RIGHT femur with previously seen compression screw now penetrating through the RIGHT femoral neck/head into the acetabulum. Osseous demineralization with degenerative changes of both hip joints.  Electronically Signed   By: Lavonia Dana M.D.   On: 03/11/2022 09:41    Microbiology: Results for orders placed or performed during the hospital encounter of 08/22/21  Resp Panel by RT-PCR (Flu A&B, Covid) Nasopharyngeal Swab     Status: None   Collection Time: 08/22/21  2:58 PM   Specimen: Nasopharyngeal Swab; Nasopharyngeal(NP) swabs in vial transport medium  Result Value Ref Range Status   SARS Coronavirus 2 by RT PCR NEGATIVE NEGATIVE Final    Comment: (NOTE) SARS-CoV-2 target nucleic acids are NOT DETECTED.  The SARS-CoV-2 RNA is generally detectable in upper respiratory specimens during the acute phase of infection. The lowest concentration of SARS-CoV-2 viral copies this assay can detect is 138 copies/mL. A negative result does not preclude SARS-Cov-2 infection and should not be used as the sole basis for treatment or other patient management decisions. A negative result may occur with  improper specimen collection/handling, submission of specimen other than nasopharyngeal swab, presence of viral mutation(s) within the areas targeted by this assay, and inadequate number of viral copies(<138 copies/mL). A negative result must be combined with clinical observations, patient history, and epidemiological information. The expected result is Negative.  Fact Sheet for Patients:  EntrepreneurPulse.com.au  Fact Sheet for Healthcare Providers:  IncredibleEmployment.be  This test is no t yet approved or cleared by the [place] FDA and  has been authorized for detection and/or diagnosis of SARS-CoV-2 by FDA under an Emergency Use Authorization (EUA). This EUA will remain  in effect (meaning this test can be used) for the duration of the COVID-19 declaration under Section 564(b)(1) of the Act, 21 U.S.C.section 360bbb-3(b)(1), unless the authorization is terminated  or revoked sooner.       Influenza A by PCR NEGATIVE NEGATIVE Final    Influenza B by PCR NEGATIVE NEGATIVE Final    Comment: (NOTE) The Xpert Xpress SARS-CoV-2/FLU/RSV plus assay is intended as an aid in the diagnosis of influenza from Nasopharyngeal swab specimens and should not be used as a sole basis for treatment. Nasal washings and aspirates are unacceptable for Xpert Xpress SARS-CoV-2/FLU/RSV testing.  Fact Sheet for Patients: EntrepreneurPulse.com.au  Fact Sheet for Healthcare Providers: IncredibleEmployment.be  This test is not yet approved or cleared by the [place] FDA and has been authorized for detection and/or diagnosis of SARS-CoV-2 by FDA under an Emergency Use Authorization (EUA). This EUA will remain in effect (meaning this test can be used) for the  duration of the COVID-19 declaration under Section 564(b)(1) of the Act, 21 U.S.C. section 360bbb-3(b)(1), unless the authorization is terminated or revoked.  Performed at [facility], [place] [address] [place], [place] [address]   Surgical PCR screen     Status: None   Collection Time: 08/23/21 11:11 AM   Specimen: Nasal Mucosa; Nasal Swab  Result Value Ref Range Status   MRSA, PCR NEGATIVE NEGATIVE Final   Staphylococcus aureus NEGATIVE NEGATIVE Final    Comment: (NOTE) The Xpert SA Assay (FDA approved for NASAL specimens in patients 30 years of age and older), is one component of a comprehensive surveillance program. It is not intended to diagnose infection nor to guide or monitor treatment. Performed at [facility], [place] [address] [place], [place] [address]   Resp Panel by RT-PCR (Flu A&B, Covid) Nasopharyngeal Swab     Status: None   Collection Time: 08/28/21 11:42 AM   Specimen: Nasopharyngeal Swab; Nasopharyngeal(NP) swabs in vial transport medium  Result Value Ref Range Status   SARS Coronavirus 2 by RT PCR NEGATIVE NEGATIVE Final    Comment: (NOTE) SARS-CoV-2 target nucleic acids are NOT DETECTED.  The SARS-CoV-2  RNA is generally detectable in upper respiratory specimens during the acute phase of infection. The lowest concentration of SARS-CoV-2 viral copies this assay can detect is 138 copies/mL. A negative result does not preclude SARS-Cov-2 infection and should not be used as the sole basis for treatment or other patient management decisions. A negative result may occur with  improper specimen collection/handling, submission of specimen other than nasopharyngeal swab, presence of viral mutation(s) within the areas targeted by this assay, and inadequate number of viral copies(<138 copies/mL). A negative result must be combined with clinical observations, patient history, and epidemiological information. The expected result is Negative.  Fact Sheet for Patients:  EntrepreneurPulse.com.au  Fact Sheet for Healthcare Providers:  IncredibleEmployment.be  This test is no t yet approved or cleared by the [place] FDA and  has been authorized for detection and/or diagnosis of SARS-CoV-2 by FDA under an Emergency Use Authorization (EUA). This EUA will remain  in effect (meaning this test can be used) for the duration of the COVID-19 declaration under Section 564(b)(1) of the Act, 21 U.S.C.section 360bbb-3(b)(1), unless the authorization is terminated  or revoked sooner.       Influenza A by PCR NEGATIVE NEGATIVE Final   Influenza B by PCR NEGATIVE NEGATIVE Final    Comment: (NOTE) The Xpert Xpress SARS-CoV-2/FLU/RSV plus assay is intended as an aid in the diagnosis of influenza from Nasopharyngeal swab specimens and should not be used as a sole basis for treatment. Nasal washings and aspirates are unacceptable for Xpert Xpress SARS-CoV-2/FLU/RSV testing.  Fact Sheet for Patients: EntrepreneurPulse.com.au  Fact Sheet for Healthcare Providers: IncredibleEmployment.be  This test is not yet approved or cleared by the  [place] FDA and has been authorized for detection and/or diagnosis of SARS-CoV-2 by FDA under an Emergency Use Authorization (EUA). This EUA will remain in effect (meaning this test can be used) for the duration of the COVID-19 declaration under Section 564(b)(1) of the Act, 21 U.S.C. section 360bbb-3(b)(1), unless the authorization is terminated or revoked.  Performed at [facility], [place] [address], [place], [place] [address]    *Note: Due to a large number of results and/or encounters for the requested time period, some results have not been displayed. A complete set of results can be found in Results Review.  Labs: CBC: Recent Labs  Lab 03/11/22 1430 03/12/22 0529 03/14/22 0618 03/15/22 0509 03/16/22 0452 03/17/22 0547 03/18/22 0553  WBC 7.2   < > 5.8 5.6 8.8 11.5* 9.3  NEUTROABS 5.4  --   --   --   --   --  8.4*  HGB 13.6   < > 13.1 13.1 11.1* 10.3* 9.3*  HCT 42.4   < > 40.3 40.9 33.6* 31.3* 28.2*  MCV 103.7*   < > 102.0* 103.0* 102.8* 101.3* 104.4*  PLT 146*   < > 155 153 140* 157 148*   < > = values in this interval not displayed.   Basic Metabolic Panel: Recent Labs  Lab 03/14/22 0618 03/15/22 0509 03/16/22 0452 03/17/22 0547 03/18/22 0553  NA 136 138 132* 135 134*  K 4.5 4.3 5.0 4.7 4.8  CL 104 103 100 103 102  CO2 '25 27 24 24 25  '$ GLUCOSE 96 95 170* 137* 141*  BUN 30* 27* 31* 36* 40*  CREATININE 1.04* 1.03* 1.26* 1.32* 1.22*  CALCIUM 9.2 9.4 8.6* 9.4 8.4*  MG  --   --   --  2.4 2.0  PHOS  --   --   --  3.7 3.9   Liver Function Tests: Recent Labs  Lab 03/14/22 0618 03/15/22 0509 03/17/22 0547 03/18/22 0553  AST 14* 13* 16 12*  ALT '14 12 17 16  '$ ALKPHOS 63 66 53 44  BILITOT 0.9 1.1 0.9 0.6  PROT 6.2* 6.4* 5.9* 5.5*  ALBUMIN 3.2* 3.3* 3.0* 2.7*   CBG: Recent Labs  Lab 03/16/22 2104  GLUCAP 160*    Discharge time spent: greater than 30 minutes.  Signed: Raiford Noble, DO [facility] 03/18/2022

## 2022-03-18 NOTE — Plan of Care (Signed)

## 2022-03-18 NOTE — Progress Notes (Signed)
Physical Therapy Treatment Patient Details Name: Kim [name] MRN: [id number] DOB: 1935/06/11 Today's Date: 03/18/2022   History of Present Illness Pt admitted from home s/p fall 03/08/22 and now unable to walk and c/o R hip and knee pain.  Pt with hx of CAD, CHF, HOH, HOCM, MI, a-fib, back surgery, R TKR and R hip fx with IM nailing. Pt now s/p conversion to Rt THA posterior approach on 03/15/22.    PT Comments    Pt had improved activity tolerance today, she was able to take several pivotal steps with RW from bed to recliner with +2 assistance. Assisted pt with R hip exercises, however she tolerated all movement to R hip poorly 2* pain. Cognition is improved today.     Recommendations for follow up therapy are one component of a multi-disciplinary discharge planning process, led by the attending physician.  Recommendations may be updated based on patient status, additional functional criteria and insurance authorization.  Follow Up Recommendations  Skilled nursing-short term rehab (<3 hours/day)     Assistance Recommended at Discharge Frequent or constant Supervision/Assistance  Patient can return home with the following Two people to help with walking and/or transfers;Two people to help with bathing/dressing/bathroom;Assistance with cooking/housework;Direct supervision/assist for medications management;Direct supervision/assist for financial management;Assist for transportation;Help with stairs or ramp for entrance   Equipment Recommendations  None recommended by PT    Recommendations for Other Services       Precautions / Restrictions Precautions Precautions: Fall Precaution Comments: No posterior hip precautions per MD Restrictions Weight Bearing Restrictions: No RLE Weight Bearing: Weight bearing as tolerated     Mobility  Bed Mobility Overal bed mobility: Needs Assistance Bed Mobility: Supine to Sit     Supine to sit: Total assist, +2 for physical assistance      General bed mobility comments: Assist to advance RLE, pivot hips and powerup trunk. Pt with increased pain during bed mobility.    Transfers Overall transfer level: Needs assistance Equipment used: Rolling walker (2 wheels) Transfers: Sit to/from Stand, Bed to chair/wheelchair/BSC Sit to Stand: +2 safety/equipment, From elevated surface, +2 physical assistance, Max assist, Mod assist   Step pivot transfers: +2 safety/equipment, +2 physical assistance, Min assist       General transfer comment: pt able to take several pivotal steps from bed to recliner    Ambulation/Gait Ambulation/Gait assistance: Min assist, +2 physical assistance, +2 safety/equipment Gait Distance (Feet): 3 Feet Assistive device: Rolling walker (2 wheels) Gait Pattern/deviations: Step-to pattern, Decreased step length - right, Decreased step length - left, Shuffle, Narrow base of support, Antalgic       General Gait Details: pt took several pivotal steps from bed to recliner with RW, distance limited by fatigue   Stairs             Wheelchair Mobility    Modified Rankin (Stroke Patients Only)       Balance Overall balance assessment: Needs assistance Sitting-balance support: No upper extremity supported, Feet supported Sitting balance-Leahy Scale: Fair     Standing balance support: Bilateral upper extremity supported, During functional activity, Reliant on assistive device for balance Standing balance-Leahy Scale: Poor Standing balance comment: rw and mod A +2 static standing                            Cognition Arousal/Alertness: Awake/alert Behavior During Therapy: WFL for tasks assessed/performed Overall Cognitive Status: Within Functional Limits for tasks assessed Area of Impairment: Attention  Following Commands: Follows one step commands with increased time       General Comments: pt is very HOH, has hearing aides in but they are making  a high pitched noise, don't seem to be working        Exercises Total Joint Exercises Ankle Circles/Pumps: AROM, Both, 10 reps, Supine Heel Slides: AAROM, Right, 5 reps, Supine, Limitations Heel Slides Limitations: pain Hip ABduction/ADduction: AAROM, Right, 5 reps, Supine    General Comments        Pertinent Vitals/Pain Pain Assessment Faces Pain Scale: Hurts whole lot Pain Location: Rt hip with movement Pain Descriptors / Indicators: Aching, Grimacing, Guarding, Sore, Moaning Pain Intervention(s): Limited activity within patient's tolerance, Monitored during session, Premedicated before session, Ice applied, Repositioned    Home Living                          Prior Function            PT Goals (current goals can now be found in the care plan section) Acute Rehab PT Goals Patient Stated Goal: Regain IND PT Goal Formulation: With patient Time For Goal Achievement: 03/30/22 Potential to Achieve Goals: Fair Progress towards PT goals: Progressing toward goals    Frequency    Min 3X/week      PT Plan Current plan remains appropriate    Co-evaluation              AM-PAC PT "6 Clicks" Mobility   Outcome Measure  Help needed turning from your back to your side while in a flat bed without using bedrails?: A Lot Help needed moving from lying on your back to sitting on the side of a flat bed without using bedrails?: Total Help needed moving to and from a bed to a chair (including a wheelchair)?: A Lot Help needed standing up from a chair using your arms (e.g., wheelchair or bedside chair)?: Total Help needed to walk in hospital room?: Total Help needed climbing 3-5 steps with a railing? : Total 6 Click Score: 8    End of Session Equipment Utilized During Treatment: Gait belt Activity Tolerance: Patient limited by fatigue;Patient limited by pain Patient left: in chair;with call bell/phone within reach;with chair alarm set Nurse Communication:  Mobility status PT Visit Diagnosis: Difficulty in walking, not elsewhere classified (R26.2);Pain Pain - Right/Left: Right Pain - part of body: Hip     Time: 6720-9470 PT Time Calculation (min) (ACUTE ONLY): 20 min  Charges:  $Therapeutic Activity: 8-22 mins                     Blondell Reveal Kistler PT 03/18/2022  Acute Rehabilitation Services Pager [phone] Office [phone]

## 2022-03-18 NOTE — Care Management Important Message (Signed)
Important Message  Patient Details IM Letter given to the Patient. Name: Kim [name] MRN: [id number] Date of Birth: [birth year]   Medicare Important Message Given:  Yes     Kerin Salen 03/18/2022, 11:46 AM

## 2022-03-18 NOTE — Progress Notes (Signed)
Called facility for report. No answer at this time. Left call back number on voicemail. Awaiting response.

## 2022-03-18 NOTE — TOC Transition Note (Signed)
Transition of Care [facility]) - CM/SW Discharge Note   Patient Details  Name: Kim [name] MRN: [id number] Date of Birth: Sep 15, 1935  Transition of [facility]) CM/SW Contact:  Vassie Moselle, LCSW Phone Number: 03/18/2022, 1:08 PM   Clinical Narrative:    Pt is to discharge to [facility] room 103B. Call to report is [phone]. Pt and daughter notified and agreeable to transfer. PTAR has been called.    Final next level of care: Skilled Nursing Facility Barriers to Discharge: No Barriers Identified   Patient Goals and CMS Choice Patient states their goals for this hospitalization and ongoing recovery are:: Return home   Choice offered to / list presented to : [facility] POA / Guardian  Discharge Placement   Existing PASRR number confirmed : 03/17/22          Patient chooses bed at: [facility] Patient to be transferred to facility by: [facility] Name of family member notified: Daughter/HCPOA Kim [name] Patient and family notified of of transfer: 03/18/22  Discharge Plan and Services                DME Arranged: N/A DME Agency: NA                  Social Determinants of Health (SDOH) Interventions     Readmission Risk Interventions    03/18/2022    1:07 PM  Readmission Risk Prevention Plan  Transportation Screening Complete  Medication Review (RN Care Manager) Complete  PCP or Specialist appointment within 3-5 days of discharge Complete  HRI or [place] Complete  SW Recovery Care/Counseling Consult Complete  Palliative Care Screening Not [place] Complete

## 2022-03-18 NOTE — Progress Notes (Signed)
Pt discharged to facility. AVS printed and placed in pt chart for facility use. Called facility for report, and left call back number on voicemail. Awaiting response. VSS. IV removed. Pt has all belongings. Pt and daughter did mention that "she lost partial dentures" during her stay here.

## 2022-04-24 ENCOUNTER — Other Ambulatory Visit
Admission: RE | Admit: 2022-04-24 | Discharge: 2022-04-24 | Disposition: A | Discharge status: Home / Self Care | Payer: Medicare PPO | Source: Ambulatory Visit | Attending: Internal Medicine | Admitting: Internal Medicine

## 2022-04-24 DIAGNOSIS — N390 Urinary tract infection, site not specified: Secondary | ICD-10-CM | POA: Diagnosis present

## 2022-04-24 LAB — URINALYSIS, ROUTINE W REFLEX MICROSCOPIC
Bilirubin Urine: NEGATIVE
Glucose, UA: NEGATIVE mg/dL
Hgb urine dipstick: NEGATIVE
Ketones, ur: 5 mg/dL — AB
Leukocytes,Ua: NEGATIVE
Nitrite: POSITIVE — AB
Protein, ur: NEGATIVE mg/dL
Specific Gravity, Urine: 1.021 (ref 1.005–1.030)
pH: 5 (ref 5.0–8.0)

## 2022-04-27 LAB — URINE CULTURE: Culture: >=100000 — AB

## 2022-04-29 ENCOUNTER — Telehealth: Payer: Self-pay

## 2022-04-29 NOTE — Telephone Encounter (Signed)
RNCM received inbound call from patient's daughter Aris [place] re: 03/11/22-03/18/22 admission. [name] reports during patient's stay here she lost her upper and lower dentures. This RNCM advised will place safety zone report with [facility] risk management department and someone will give her a call once their investigation is completed. [name] verbalized understanding.  No additional TOC needs at this time.

## 2022-10-21 DIAGNOSIS — I5032 Chronic diastolic (congestive) heart failure: Secondary | ICD-10-CM | POA: Diagnosis not present

## 2022-10-26 DIAGNOSIS — I480 Paroxysmal atrial fibrillation: Secondary | ICD-10-CM | POA: Diagnosis not present

## 2022-10-26 DIAGNOSIS — D0512 Intraductal carcinoma in situ of left breast: Secondary | ICD-10-CM | POA: Diagnosis not present

## 2022-10-26 DIAGNOSIS — M1909 Primary osteoarthritis, other specified site: Secondary | ICD-10-CM | POA: Diagnosis not present

## 2022-10-26 DIAGNOSIS — E038 Other specified hypothyroidism: Secondary | ICD-10-CM | POA: Diagnosis not present

## 2022-11-04 DIAGNOSIS — M810 Age-related osteoporosis without current pathological fracture: Secondary | ICD-10-CM | POA: Diagnosis not present

## 2022-11-04 DIAGNOSIS — E039 Hypothyroidism, unspecified: Secondary | ICD-10-CM | POA: Diagnosis not present

## 2022-11-04 DIAGNOSIS — E785 Hyperlipidemia, unspecified: Secondary | ICD-10-CM | POA: Diagnosis not present

## 2022-11-04 DIAGNOSIS — E538 Deficiency of other specified B group vitamins: Secondary | ICD-10-CM | POA: Diagnosis not present

## 2022-11-04 DIAGNOSIS — I10 Essential (primary) hypertension: Secondary | ICD-10-CM | POA: Diagnosis not present

## 2022-11-11 ENCOUNTER — Encounter (HOSPITAL_COMMUNITY): Payer: Self-pay | Admitting: *Deleted

## 2022-11-11 DIAGNOSIS — D692 Other nonthrombocytopenic purpura: Secondary | ICD-10-CM | POA: Diagnosis not present

## 2022-11-11 DIAGNOSIS — N1831 Chronic kidney disease, stage 3a: Secondary | ICD-10-CM | POA: Diagnosis not present

## 2022-11-11 DIAGNOSIS — Z0000 Encounter for general adult medical examination without abnormal findings: Secondary | ICD-10-CM | POA: Diagnosis not present

## 2022-11-11 DIAGNOSIS — C3490 Malignant neoplasm of unspecified part of unspecified bronchus or lung: Secondary | ICD-10-CM | POA: Diagnosis not present

## 2022-11-11 DIAGNOSIS — E039 Hypothyroidism, unspecified: Secondary | ICD-10-CM | POA: Diagnosis not present

## 2022-11-11 DIAGNOSIS — D6869 Other thrombophilia: Secondary | ICD-10-CM | POA: Diagnosis not present

## 2022-11-11 DIAGNOSIS — M25562 Pain in left knee: Secondary | ICD-10-CM | POA: Diagnosis not present

## 2022-11-11 DIAGNOSIS — I10 Essential (primary) hypertension: Secondary | ICD-10-CM | POA: Diagnosis not present

## 2022-11-11 DIAGNOSIS — R82998 Other abnormal findings in urine: Secondary | ICD-10-CM | POA: Diagnosis not present

## 2022-11-11 DIAGNOSIS — R7301 Impaired fasting glucose: Secondary | ICD-10-CM | POA: Diagnosis not present

## 2022-11-11 DIAGNOSIS — I25118 Atherosclerotic heart disease of native coronary artery with other forms of angina pectoris: Secondary | ICD-10-CM | POA: Diagnosis not present

## 2022-11-20 DIAGNOSIS — M6281 Muscle weakness (generalized): Secondary | ICD-10-CM | POA: Diagnosis not present

## 2022-11-20 DIAGNOSIS — R269 Unspecified abnormalities of gait and mobility: Secondary | ICD-10-CM | POA: Diagnosis not present

## 2022-11-21 DIAGNOSIS — I5032 Chronic diastolic (congestive) heart failure: Secondary | ICD-10-CM | POA: Diagnosis not present

## 2022-11-24 DIAGNOSIS — R269 Unspecified abnormalities of gait and mobility: Secondary | ICD-10-CM | POA: Diagnosis not present

## 2022-11-24 DIAGNOSIS — M6281 Muscle weakness (generalized): Secondary | ICD-10-CM | POA: Diagnosis not present

## 2022-11-29 DIAGNOSIS — M6281 Muscle weakness (generalized): Secondary | ICD-10-CM | POA: Diagnosis not present

## 2022-11-29 DIAGNOSIS — R269 Unspecified abnormalities of gait and mobility: Secondary | ICD-10-CM | POA: Diagnosis not present

## 2022-12-01 DIAGNOSIS — M6281 Muscle weakness (generalized): Secondary | ICD-10-CM | POA: Diagnosis not present

## 2022-12-01 DIAGNOSIS — R269 Unspecified abnormalities of gait and mobility: Secondary | ICD-10-CM | POA: Diagnosis not present

## 2022-12-07 DIAGNOSIS — R269 Unspecified abnormalities of gait and mobility: Secondary | ICD-10-CM | POA: Diagnosis not present

## 2022-12-07 DIAGNOSIS — M6281 Muscle weakness (generalized): Secondary | ICD-10-CM | POA: Diagnosis not present

## 2022-12-08 ENCOUNTER — Encounter (HOSPITAL_COMMUNITY): Payer: Self-pay | Admitting: Cardiology

## 2022-12-08 ENCOUNTER — Ambulatory Visit (HOSPITAL_COMMUNITY)
Admission: RE | Admit: 2022-12-08 | Discharge: 2022-12-08 | Disposition: A | Discharge status: Home / Self Care | Payer: Medicare PPO | Source: Ambulatory Visit | Attending: Cardiology | Admitting: Cardiology

## 2022-12-08 VITALS — BP 140/80 | HR 68 | Wt 146.4 lb

## 2022-12-08 DIAGNOSIS — I5032 Chronic diastolic (congestive) heart failure: Secondary | ICD-10-CM | POA: Diagnosis not present

## 2022-12-08 DIAGNOSIS — I110 Hypertensive heart disease with heart failure: Secondary | ICD-10-CM | POA: Diagnosis not present

## 2022-12-08 DIAGNOSIS — R42 Dizziness and giddiness: Secondary | ICD-10-CM | POA: Diagnosis present

## 2022-12-08 DIAGNOSIS — Z7901 Long term (current) use of anticoagulants: Secondary | ICD-10-CM | POA: Diagnosis not present

## 2022-12-08 DIAGNOSIS — I340 Nonrheumatic mitral (valve) insufficiency: Secondary | ICD-10-CM | POA: Insufficient documentation

## 2022-12-08 DIAGNOSIS — Z79899 Other long term (current) drug therapy: Secondary | ICD-10-CM | POA: Insufficient documentation

## 2022-12-08 DIAGNOSIS — I421 Obstructive hypertrophic cardiomyopathy: Secondary | ICD-10-CM | POA: Diagnosis not present

## 2022-12-08 DIAGNOSIS — Z955 Presence of coronary angioplasty implant and graft: Secondary | ICD-10-CM | POA: Diagnosis not present

## 2022-12-08 DIAGNOSIS — I4891 Unspecified atrial fibrillation: Secondary | ICD-10-CM | POA: Insufficient documentation

## 2022-12-08 DIAGNOSIS — E785 Hyperlipidemia, unspecified: Secondary | ICD-10-CM | POA: Diagnosis not present

## 2022-12-08 DIAGNOSIS — I2510 Atherosclerotic heart disease of native coronary artery without angina pectoris: Secondary | ICD-10-CM | POA: Diagnosis not present

## 2022-12-08 DIAGNOSIS — M791 Myalgia, unspecified site: Secondary | ICD-10-CM | POA: Diagnosis not present

## 2022-12-08 DIAGNOSIS — I484 Atypical atrial flutter: Secondary | ICD-10-CM | POA: Diagnosis not present

## 2022-12-08 LAB — COMPREHENSIVE METABOLIC PANEL
ALT: 28 U/L (ref 0–44)
AST: 21 U/L (ref 15–41)
Albumin: 3.8 g/dL (ref 3.5–5.0)
Alkaline Phosphatase: 69 U/L (ref 38–126)
Anion gap: 10 (ref 5–15)
BUN: 21 mg/dL (ref 8–23)
CO2: 25 mmol/L (ref 22–32)
Calcium: 9.1 mg/dL (ref 8.9–10.3)
Chloride: 103 mmol/L (ref 98–111)
Creatinine, Ser: 1.44 mg/dL — ABNORMAL HIGH (ref 0.44–1.00)
GFR, Estimated: 35 mL/min — ABNORMAL LOW (ref >60)
Glucose, Bld: 102 mg/dL — ABNORMAL HIGH (ref 70–99)
Potassium: 3.9 mmol/L (ref 3.5–5.1)
Sodium: 138 mmol/L (ref 135–145)
Total Bilirubin: 0.5 mg/dL (ref 0.3–1.2)
Total Protein: 6.4 g/dL — ABNORMAL LOW (ref 6.5–8.1)

## 2022-12-08 LAB — CBC
HCT: 41.3 % (ref 36.0–46.0)
Hemoglobin: 13.6 g/dL (ref 12.0–15.0)
MCH: 33.8 pg (ref 26.0–34.0)
MCHC: 32.9 g/dL (ref 30.0–36.0)
MCV: 102.7 fL — ABNORMAL HIGH (ref 80.0–100.0)
Platelets: 167 10*3/uL (ref 150–400)
RBC: 4.02 MIL/uL (ref 3.87–5.11)
RDW: 12.7 % (ref 11.5–15.5)
WBC: 5.5 10*3/uL (ref 4.0–10.5)
nRBC: 0 % (ref 0.0–0.2)

## 2022-12-08 LAB — TSH: TSH: 2.244 u[IU]/mL (ref 0.350–4.500)

## 2022-12-08 MED ORDER — METOPROLOL SUCCINATE ER 25 MG PO TB24: 25.0000 mg | ORAL_TABLET | Freq: Two times a day (BID) | ORAL | 3 refills | Status: DC

## 2022-12-08 NOTE — Patient Instructions (Signed)
STOP Amiodarone  INCREASE Toprol XL to 25 mg Twice daily  Labs done today, your results will be available in MyChart, we will contact you for abnormal readings.  Your physician recommends that you schedule a follow-up appointment in: 4 months (July) ** please call the office in May to arrange your follow up appointment. **  If you have any questions or concerns before your next appointment please send [place] a message through [place] or call our office at [phone].    TO LEAVE A MESSAGE FOR THE NURSE SELECT OPTION 2, PLEASE LEAVE A MESSAGE INCLUDING: YOUR NAME DATE OF BIRTH CALL BACK NUMBER REASON FOR CALL**this is important as we prioritize the call backs  YOU WILL RECEIVE A CALL BACK THE SAME DAY AS LONG AS YOU CALL BEFORE 4:00 PM  At the [facility], you and your health needs are our priority. As part of our continuing mission to provide you with exceptional heart care, we have created designated Provider Care Teams. These Care Teams include your primary Cardiologist (physician) and Advanced Practice Providers (APPs- Physician Assistants and Nurse Practitioners) who all work together to provide you with the care you need, when you need it.   You may see any of the following providers on your designated Care Team at your next follow up: Dr Glori Bickers Dr [name] Dr. Roxana Hires, NP Lyda Jester, [place] [facility] [place], [place] Forestine Na, NP Audry Riles, PharmD   Please be sure to bring in all your medications bottles to every appointment.    Thank you for choosing [facility]

## 2022-12-09 NOTE — Progress Notes (Signed)
Patient ID: Kim [name], female   DOB: April 10, 1935, 88 y.o.   MRN: [id number] PCP: Sueanne Margarita, DO Oncologist: Dr. Marin Olp Cardiology: Dr. Aundra Dubin  88 y.o. with history of CAD, HCM, paroxysmal atrial fibrillation, and mitral regurgitation presents for followup of HCM, CHF, MR.  Patient had breast cancer in 2015 and received treatment involving Herceptin.  During breast cancer treatment, she developed unstable angina and ended up getting a DES to the mid RCA in 3/15.  Patient additionally has a history of HOCM.  This has been recognized on prior echoes.  She has severe asymmetric basal septal hypertrophy and SAM with LVOT gradient peak 58 mmHg on echo in 3/15 along with moderate MR.  Repeat echo in 7/15 showed LVOT gradient down to 30 mmHg on higher beta blocker.  Repeat echo in 11/15 showed no significant LVOT gradient but SAM still present.  Echo (8/16) showed asymmetric septal hypertrophy, No SAM, no significant LVOT gradient.  Echo 4/18 showed EF 55-60%, small LVOT gradient with severe asymmetric septal hypertrophy, mild MR, PASP 32 mmHg.    She was admitted in 1/20 with symptomatic atrial fibrillation with RVR, this was a new diagnosis.  She was started on Eliquis and cardioverted after TEE back to NSR.  TEE showed severe mitral regurgitation that appeared to be due mostly to posterior leaflet prolapse, there was minimal systolic anterior motion.   In 3/20, she had Mitraclip placement. Echo in 6/20 showed EF 60-65%, severe asymmetric septal hypertrophy, normal RV, s/p Mitraclip with mean gradient 3 mmHg and trivial MR.  Echo in 3/21 showed EF 65%, asymmetric septal hypertrophy with LVOT gradient 37 mmHg, SAM of clipped segment of the mitral valve, normal RV, severe LAE, s/p Mitraclip x 2 with mild MR/no MS.   In October she went to [facility] ED on 07/07/20 for chest pain. She was in A fib at that time. Work up negative so she was discharged later that day.   Had cardioversion 08/07/20 -->  NSR  Evaluated in the ED on 01/23/21 for  presyncope on 01/22/21 and syncope 01/23/21. She does not recall tripping or dizziness. Bruising noted on face/arms. No acute findings. She declined cardiology consultation and preferred to be seen in the clinic. She was supposed to wear Zio patch but this was never done.  Carotid dopplers showed no significant disease in 5/22.   Echo in 5/22 showed EF 60-65% with several focal basal septal hypertrophy, there does not appear to be a significant LVOT gradient, s/p Mitraclip with mean gradient 6 mmHg and mild-moderate MR, RV normal, PASP 38 mmHg.   She developed symptomatic atrial fibrillation again and underwent DCCV in 6/22 on amiodarone. Zio monitor in 8/22 showed NSR, short runs of SVT, no AF.  She was admitted in 11/22 after a mechanical fall with a right hip fracture, now s/p repair. She was noted to be in atrial fibrillation during that admission.    S/P DC-CV 11/26/21 with restoration NSR. Echo in 6/23 with EF 60-65%, severe asymmetric basal septal hypertrophy, RV normal, s/p Mitraclip with mild MR, IVC normal.   Today she returns for HF follow up with her daughter. She has gone back into atypical flutter and remains in flutter despite amiodarone.  She is now using a wheelchair at all times and lives in [place].  No dyspnea with transfers.  Occasional mild lightheadedness when she transfers.  No chest pain.  She does not feel palpitations. Now in SNF, no recent falls.    Labs (3/15): K  4.5, creatinine 0.84, LDL 91, HDL 46 Labs (5/15): K 3.9, creatinine 1.1 Labs (12/15): LDL 80, HDL 28, hemoglobin 10.9 Labs (1/16): K 3.7, creatinine 0.8 Labs (2/16): K 3.6, creatinine 1.2, HCT 34.4 Labs (6/16): K 4.1, creatinine 1.24 Labs (7/17): K 4.1, creatinine 1.2, HCT 38.8, LDL 143 Labs (1/18): LDL 142 Labs (5/18): K 4.4, creatinine 1.0 Labs (1/20): K 3.9, creatinine 1.27, LDL 95 Labs (8/20): K 3.8, creatinine 2.06, LFTs normal Labs (12/20): K 4.1, creatinine  1.14 Labs (2/21): LDL 121 Labs (3/21): K 4.4, creatinine 1.27 Labs (01/23/21): K 3.5 Creatinine 1.2, TSH normal, BNP 385 Labs (5/22): K 4.3, creatinine 1.26, hgb 11.7, LDL 122 Labs (7/22): LFTs normal Labs (8/22): K 4.3, creatinine 1.45 Labs (11/22): K 3.7, creatinine 1.24 Labs (6/23): K 4.8, creatinine 1.22, LFTs normal  ECG (personally reviewed): atypical atrial flutter with inferolateral ST depression  PMH: 1. CAD: Unstable angina 3/15 with LHC showing 99% mRCA stenosis, treated with DES to mRCA.  2. Hypothyroidism 3. Diverticulosis 4. HTN 5. H/o TAH/BSO 6. Breast cancer: s/p lumpectomy with lymph node biopsy in 2/15.  4/10 nodes positive.  She was started on docetaxol/carboplatin/Herceptin in 3/15 with plan for 6 cycles chemo.  7. Hypertrophic obstructive cardiomyopathy: Echo (3/15) with severe focal basal septal hypertrophy (22 mm), narrow LV outflow tract with mitral valve SAM and 58 mmHg peak LVOT resting gradient, EF 60-65%, moderate MR, moderate LAE, normal RV, lateral s' 10.4 cm/sec.  Patient says that her 2 grown sons has had echoes to screen for HOCM.  Echo (4/15) with EF 65-70%, severe focal basal septal hypertrophy, LVOT gradient 33 mmHg, SAM was present with moderate MR, normal RV size and systolic function, lateral S' 10.6, GLS -17.5%.  Cardiac MRI (5/15) with EF 65%, moderate asymmetric septal hypertrophy, systolic anterior motion of the mitral valve with moderate MR, there was no delayed enhancement.  Echo (7/15) with EF 65-70%, moderate ASH, peak LVOT gradient 30 mmHg, SAM with moderate MR, moderate to severe LAE.  Echo (11/15) with EF 55-60%, GLS -15%, no significant LVOT gradient, systolic anterior motion of the mitral valve with mild MR, normal RV size and systolic function. Echo (4/14) with EF 55-60%, severe asymmetric septal hypertrophy, LVOT gradient 20 mmHg, mild-moderate MR, GLS -18%.  - Echo (8/16): EF 60-65%, asymmetric septal hypertrophy, no significant LV outflow  tract gradient, mild MR.   - Echo (4/18): EF 55-60%, small LVOT gradient with severe asymmetric septal hypertrophy, mild MR, PASP 32 mmHg.   - TEE (1/20): EF 55-60%, moderate-severe asymmetric septal hypertrophy, no LVOT gradient, normal RV size and systolic function, severe LAE, severe MR with posterior MV leaflet prolapse and minimal SAM.   - Echo (6/20): EF 60-65%, severe asymmetric septal hypertrophy, normal RV, s/p Mitraclip with mean gradient 3 mmHg and trivial MR.   - Echo (3/21): EF 65%, asymmetric septal hypertrophy with LVOT gradient 37 mmHg, SAM of clipped segment of the mitral valve, normal RV, severe LAE, s/p Mitraclip x 2 with mild MR/no MS.  - Echo (5/22): EF 60-65% with several focal basal septal hypertrophy, There was no significant LVOT gradient, s/p Mitraclip with mean gradient 6 mmHg and mild-moderate MR, RV normal, PASP 38 mmHg.  - Echo (6/23): EF 60-65%, severe asymmetric basal septal hypertrophy, RV normal, s/p Mitraclip with mild MR, IVC normal.  8. Hyperlipidemia: Myalgias with atorvastatin, myalgias with Repatha.  9. Atrial fibrillation/atypical flutter: Now chronic - DCCV 11/21.  - DCCV 6/22 - Zio monitor (8/22): NSR, rare short SVT, no  AF.  - DCCV 2/23 10. Mitral regurgitation: Appears severe on 1/20 TEE.  Has posterior leaflet prolapse with minimal SAM.  - Mitraclip 3/20.  11. Right hip fracture 11/22 with repair.   SH: Married, 2 children, retired, nonsmoker.   FH: No family history of HOCM or sudden death.  There is a family history of CAD.   ROS: All systems reviewed and negative except as per HPI.    Current Outpatient Medications  Medication Sig Dispense Refill   acetaminophen (TYLENOL) 500 MG tablet Take 1,000 mg by mouth 2 (two) times daily.     diclofenac Sodium (VOLTAREN) 1 % GEL Apply 4 g topically 2 (two) times daily as needed (knee pain).     diltiazem (CARDIZEM CD) 240 MG 24 hr capsule Take 1 capsule (240 mg total) by mouth daily.     docusate  sodium (COLACE) 100 MG capsule Take 100 mg by mouth as needed for mild constipation.     ELIQUIS 5 MG TABS tablet TAKE 1 TABLET BY MOUTH TWICE (2) DAILY 180 tablet 1   exemestane (AROMASIN) 25 MG tablet TAKE 1 TABLET BY MOUTH ONCE DAILY AFTER BREAKFAST 90 tablet 3   feeding supplement (ENSURE ENLIVE / ENSURE PLUS) LIQD Take 237 mLs by mouth 2 (two) times daily between meals. 237 mL 12   ferrous gluconate (FERGON) 324 MG tablet Take 324 mg by mouth daily with lunch.     furosemide (LASIX) 40 MG tablet TAKE 1 TABLET BY MOUTH AS NEEDED FOR FLUID OR EDEMA. . 30 tablet 11   gabapentin (NEURONTIN) 100 MG capsule Take 100 mg by mouth every other day.     levothyroxine (SYNTHROID) 137 MCG tablet Take 137 mcg by mouth daily before breakfast.     menthol-cetylpyridinium (CEPACOL) 3 MG lozenge Take 1 lozenge (3 mg total) by mouth as needed for sore throat. 100 tablet 12   Multiple Vitamin (MULTIVITAMIN WITH MINERALS) TABS tablet Take 1 tablet by mouth daily.     nitroGLYCERIN (NITROSTAT) 0.4 MG SL tablet Place 1 tablet (0.4 mg total) under the tongue every 5 (five) minutes as needed for chest pain. 100 tablet 3   ondansetron (ZOFRAN) 4 MG tablet Take 1 tablet (4 mg total) by mouth every 6 (six) hours as needed for nausea. 20 tablet 0   polyethylene glycol (MIRALAX / GLYCOLAX) 17 g packet Take 17 g by mouth daily as needed for mild constipation. 14 each 0   potassium chloride SA (KLOR-CON M) 20 MEQ tablet TAKE 1/2 TO 1 TABLET (10-20 MEQ TOTAL) BY MOUTH AS NEEDED. ONLY TAKE 1 FULL TABLET (20 MEQ) IF YOU TAKE LASIX. 30 tablet 11   senna-docusate (SENOKOT-S) 8.6-50 MG tablet Take 1 tablet by mouth at bedtime as needed for mild constipation.     vitamin B-12 (CYANOCOBALAMIN) 1000 MCG tablet Take 1,000 mcg by mouth daily.     metoprolol succinate (TOPROL-XL) 25 MG 24 hr tablet Take 1 tablet (25 mg total) by mouth 2 (two) times daily. 180 tablet 3   No current facility-administered medications for this encounter.    BP (!) 140/80   Pulse 68   Wt 66.4 kg (146 lb 6.4 oz)   LMP 10/19/1991   SpO2 98%   BMI 22.26 kg/m   Vitals:   12/08/22 1601  BP: (!) 140/80  Pulse: 68  SpO2: 98%    Wt Readings from Last 3 Encounters:  12/08/22 66.4 kg (146 lb 6.4 oz)  03/11/22 64.7 kg (142 lb 10.2 oz)  02/03/22 64.6 kg (142 lb 6 oz)   General: NAD Neck: No JVD, no thyromegaly or thyroid nodule.  Lungs: Clear to auscultation bilaterally with normal respiratory effort. CV: Nondisplaced PMI.  Heart irregular S1/S2, no S3/S4, 2/6 early SEM RUSB.  No peripheral edema.  No carotid bruit.  Normal pedal pulses.  Abdomen: Soft, nontender, no hepatosplenomegaly, no distention.  Skin: Intact without lesions or rashes.  Neurologic: Alert and oriented x 3.  Psych: Normal affect. Extremities: No clubbing or cyanosis.  HEENT: Normal.   Assessment/Plan: 1. CAD: Status post PCI for unstable angina in 3/15 with DES to [facility]. No chest pain.    - No ASA, taking Eliquis..     - Unable to tolerate statins or Repatha. 2. Hyperlipidemia: Myalgias with Crestor, Lipitor, and pravastatin.  Myalgias with Repatha. Did not tolerate Zetia.  3. Hypertrophic obstructive cardiomyopathy: No family history of HOCM or sudden death (interestingly, her husband has HOCM). Cardiac MRI in 5/15 showed no delayed enhancement.  Echo in 3/21 (post-Mitraclip) showed asymmetric septal hypertrophy with LVOT gradient peak 37 mmHg, SAM of the clipped segment of the mitral valve with mild MR, no MS.  Echo in 5/22 showed asymmetric septal hypertrophy but there was no significant LVOT gradient.   Echo in 6/23 with EF 60-65%, severe asymmetric basal septal hypertrophy but no significant LVOT gradient, RV normal, s/p Mitraclip with mild MR, IVC normal. Minimal murmur on exam  - Continue diltiazem CD 240 mg daily.  - Increase Toprol XL to 25 mg bid since we are stopping amiodarone.    - Not candidate for mavacamten with no significant LVOT gradient detected.  -  Patient's daughter still needs echo screening for HCM, her son has been screened.     4. Atrial fibrillation: Noted initially in 1/20, suspect this was triggered by mitral regurgitation and HCM.  She was cardioverted to NSR.  She was on amiodarone for a few months, this was stopped after Mitraclip. In A fib 07/07/20. Had successful cardioversion 08/07/20 and in 6/22.  She was in atrial fibrillation in 11/22 when she fractured her hip and is in atypical atrial flutter.  S/P DC-CV 11/26/21 with restoration to NSR but she went back into atypical atrial flutter quickly despite amiodarone use.  I do not think we have good options for her for rhythm control.  She is in atypical flutter today.  - Stop amiodarone. Check CMET, TSH today.  - Increase Toprol XL to 25 mg bid.  - Continue diltiazem.  - Continue Eliquis. CBC today.  5. Chronic diastolic CHF:  Echo in 0000000 with normal EF and normal RV, no significant LVOT gradient was measured. NYHA class II but minimally active.  She is not volume overloaded on exam.   - She takes Lasix prn.   6. Mitral regurgitation: Suspect that mitral regurgitation may have been a trigger for atrial fibrillation.  She has now had Mitraclip placement, echo in 6/23 showed mild MR. - Antibiotics with dental work.     Followup 4 months with APP.   [name]  12/09/2022

## 2022-12-15 DIAGNOSIS — M6281 Muscle weakness (generalized): Secondary | ICD-10-CM | POA: Diagnosis not present

## 2022-12-15 DIAGNOSIS — M1712 Unilateral primary osteoarthritis, left knee: Secondary | ICD-10-CM | POA: Diagnosis not present

## 2022-12-15 DIAGNOSIS — R269 Unspecified abnormalities of gait and mobility: Secondary | ICD-10-CM | POA: Diagnosis not present

## 2022-12-16 DIAGNOSIS — R269 Unspecified abnormalities of gait and mobility: Secondary | ICD-10-CM | POA: Diagnosis not present

## 2022-12-16 DIAGNOSIS — M6281 Muscle weakness (generalized): Secondary | ICD-10-CM | POA: Diagnosis not present

## 2022-12-20 DIAGNOSIS — I5032 Chronic diastolic (congestive) heart failure: Secondary | ICD-10-CM | POA: Diagnosis not present

## 2023-01-07 ENCOUNTER — Other Ambulatory Visit: Payer: Self-pay | Admitting: *Deleted

## 2023-01-07 DIAGNOSIS — Z170 Estrogen receptor positive status [ER+]: Secondary | ICD-10-CM

## 2023-01-10 ENCOUNTER — Other Ambulatory Visit: Payer: Self-pay

## 2023-01-10 ENCOUNTER — Inpatient Hospital Stay: Payer: Medicare PPO | Attending: Hematology & Oncology

## 2023-01-10 ENCOUNTER — Inpatient Hospital Stay: Payer: Medicare PPO | Admitting: Hematology & Oncology

## 2023-01-10 ENCOUNTER — Encounter: Payer: Self-pay | Admitting: Hematology & Oncology

## 2023-01-10 ENCOUNTER — Ambulatory Visit (HOSPITAL_BASED_OUTPATIENT_CLINIC_OR_DEPARTMENT_OTHER)
Admission: RE | Admit: 2023-01-10 | Discharge: 2023-01-10 | Disposition: A | Discharge status: Home / Self Care | Payer: Medicare PPO | Source: Ambulatory Visit | Attending: Hematology & Oncology | Admitting: Hematology & Oncology

## 2023-01-10 VITALS — BP 133/86 | HR 70 | Temp 98.8°F | Resp 20 | Ht 68.0 in | Wt 147.0 lb

## 2023-01-10 DIAGNOSIS — Z853 Personal history of malignant neoplasm of breast: Secondary | ICD-10-CM | POA: Diagnosis not present

## 2023-01-10 DIAGNOSIS — C50412 Malignant neoplasm of upper-outer quadrant of left female breast: Secondary | ICD-10-CM

## 2023-01-10 DIAGNOSIS — R911 Solitary pulmonary nodule: Secondary | ICD-10-CM

## 2023-01-10 DIAGNOSIS — R918 Other nonspecific abnormal finding of lung field: Secondary | ICD-10-CM | POA: Diagnosis not present

## 2023-01-10 DIAGNOSIS — C3490 Malignant neoplasm of unspecified part of unspecified bronchus or lung: Secondary | ICD-10-CM | POA: Diagnosis not present

## 2023-01-10 DIAGNOSIS — D500 Iron deficiency anemia secondary to blood loss (chronic): Secondary | ICD-10-CM | POA: Diagnosis not present

## 2023-01-10 LAB — CBC WITH DIFFERENTIAL (CANCER CENTER ONLY)
Abs Immature Granulocytes: 0.01 10*3/uL (ref 0.00–0.07)
Basophils Absolute: 0 10*3/uL (ref 0.0–0.1)
Basophils Relative: 1 %
Eosinophils Absolute: 0.1 10*3/uL (ref 0.0–0.5)
Eosinophils Relative: 1 %
HCT: 41.8 % (ref 36.0–46.0)
Hemoglobin: 13.7 g/dL (ref 12.0–15.0)
Immature Granulocytes: 0 %
Lymphocytes Relative: 15 %
Lymphs Abs: 0.8 10*3/uL (ref 0.7–4.0)
MCH: 33.4 pg (ref 26.0–34.0)
MCHC: 32.8 g/dL (ref 30.0–36.0)
MCV: 102 fL — ABNORMAL HIGH (ref 80.0–100.0)
Monocytes Absolute: 0.6 10*3/uL (ref 0.1–1.0)
Monocytes Relative: 11 %
Neutro Abs: 3.7 10*3/uL (ref 1.7–7.7)
Neutrophils Relative %: 72 %
Platelet Count: 171 10*3/uL (ref 150–400)
RBC: 4.1 MIL/uL (ref 3.87–5.11)
RDW: 12.7 % (ref 11.5–15.5)
WBC Count: 5.2 10*3/uL (ref 4.0–10.5)
nRBC: 0 % (ref 0.0–0.2)

## 2023-01-10 LAB — CMP (CANCER CENTER ONLY)
ALT: 22 U/L (ref 0–44)
AST: 18 U/L (ref 15–41)
Albumin: 4.2 g/dL (ref 3.5–5.0)
Alkaline Phosphatase: 76 U/L (ref 38–126)
Anion gap: 6 (ref 5–15)
BUN: 23 mg/dL (ref 8–23)
CO2: 30 mmol/L (ref 22–32)
Calcium: 9.4 mg/dL (ref 8.9–10.3)
Chloride: 102 mmol/L (ref 98–111)
Creatinine: 1.52 mg/dL — ABNORMAL HIGH (ref 0.44–1.00)
GFR, Estimated: 33 mL/min — ABNORMAL LOW (ref >60)
Glucose, Bld: 75 mg/dL (ref 70–99)
Potassium: 4.7 mmol/L (ref 3.5–5.1)
Sodium: 138 mmol/L (ref 135–145)
Total Bilirubin: 0.6 mg/dL (ref 0.3–1.2)
Total Protein: 6.7 g/dL (ref 6.5–8.1)

## 2023-01-10 LAB — IRON AND IRON BINDING CAPACITY (CC-WL,HP ONLY)
Iron: 103 ug/dL (ref 28–170)
Saturation Ratios: 29 % (ref 10.4–31.8)
TIBC: 360 ug/dL (ref 250–450)
UIBC: 257 ug/dL (ref 148–442)

## 2023-01-10 LAB — FERRITIN: Ferritin: 30 ng/mL (ref 11–307)

## 2023-01-10 NOTE — Progress Notes (Signed)
Hematology and Oncology Follow Up Visit  Kim [name] [id number] Dec 01, 1934 88 y.o. 01/10/2023   Principle Diagnosis:  Stage IIA (T1N1M0) adenocarcinoma of the left breast-triple positive Iron deficiency anemia secondary to blood loss Solitary left upper lobe nodule - hypermetabolic on PET scan  Current Therapy:   Status post cycle 4 of Abraxane/Herceptin Maintenance Herceptin q 3wk - finish 03/25/2015 Zometa 4 mg IV q 6 months - due in 03/2022  Aromasin 25 mg by mouth daily  - start on 01/10/2017  Feraheme as needed-dose given today      Interim History:  [name] is back for followup.  We have not seen her in over a year.  Last time that I saw her was back in February 2023.  At that time, she had a 11 mm left upper lobe nodule that was active on PET scan.  It was felt that this was likely a primary bronchogenic carcinoma.  We had gotten her set up for SBRT.  Unfortunately,, she really has had problems.  She has fallen.  She has broken her right thigh and hip.  She needed surgery.  She is never been able to make it to Radiation Oncology.  I just hate that this might be larger.  We are going to have to check it out.  She has dementia.  She is living in assisted living right now.  She does not have any cough.  There is no shortness of breath.  She is having no bleeding.  There is no fever.  She has had no problems with COVID.  Patient does have issues with arthritis.  This has been chronic.  I think we will go ahead to get another CT scan on her.  Again it has been over a year that she has had an evaluation for this nodule.   Last chest x-ray that she had was back in June 2023.  This did not mention any nodule in the left upper lobe.  She seems to be eating okay.  She is having no problems with respect to the breast cancer that she had.  There is no cardiac issues.  Overall, I would say that her performance status is probably ECOG 2.     Medications:  Current Outpatient  Medications:    acetaminophen (TYLENOL) 500 MG tablet, Take 1,000 mg by mouth 2 (two) times daily., Disp: , Rfl:    amoxicillin (AMOXIL) 500 MG capsule, Take 2,000 mg by mouth. One hour before dental work., Disp: , Rfl:    diclofenac Sodium (VOLTAREN) 1 % GEL, Apply 4 g topically 2 (two) times daily as needed (knee pain)., Disp: , Rfl:    diltiazem (CARDIZEM CD) 240 MG 24 hr capsule, Take 1 capsule (240 mg total) by mouth daily., Disp: , Rfl:    docusate sodium (COLACE) 100 MG capsule, Take 100 mg by mouth as needed for mild constipation., Disp: , Rfl:    ELIQUIS 5 MG TABS tablet, TAKE 1 TABLET BY MOUTH TWICE (2) DAILY, Disp: 180 tablet, Rfl: 1   exemestane (AROMASIN) 25 MG tablet, TAKE 1 TABLET BY MOUTH ONCE DAILY AFTER BREAKFAST, Disp: 90 tablet, Rfl: 3   feeding supplement (ENSURE ENLIVE / ENSURE PLUS) LIQD, Take 237 mLs by mouth 2 (two) times daily between meals., Disp: 237 mL, Rfl: 12   ferrous gluconate (FERGON) 324 MG tablet, Take 324 mg by mouth daily with lunch., Disp: , Rfl:    furosemide (LASIX) 40 MG tablet, TAKE 1 TABLET BY MOUTH  AS NEEDED FOR FLUID OR EDEMA. ., Disp: 30 tablet, Rfl: 11   gabapentin (NEURONTIN) 100 MG capsule, Take 100 mg by mouth daily., Disp: , Rfl:    levothyroxine (SYNTHROID) 137 MCG tablet, Take 137 mcg by mouth daily before breakfast., Disp: , Rfl:    menthol-cetylpyridinium (CEPACOL) 3 MG lozenge, Take 1 lozenge (3 mg total) by mouth as needed for sore throat., Disp: 100 tablet, Rfl: 12   metoprolol succinate (TOPROL-XL) 25 MG 24 hr tablet, Take 1 tablet (25 mg total) by mouth 2 (two) times daily., Disp: 180 tablet, Rfl: 3   Multiple Vitamin (MULTIVITAMIN WITH MINERALS) TABS tablet, Take 1 tablet by mouth daily., Disp: , Rfl:    ondansetron (ZOFRAN) 4 MG tablet, Take 1 tablet (4 mg total) by mouth every 6 (six) hours as needed for nausea., Disp: 20 tablet, Rfl: 0   polyethylene glycol (MIRALAX / GLYCOLAX) 17 g packet, Take 17 g by mouth daily as needed for mild  constipation., Disp: 14 each, Rfl: 0   potassium chloride SA (KLOR-CON M) 20 MEQ tablet, TAKE 1/2 TO 1 TABLET (10-20 MEQ TOTAL) BY MOUTH AS NEEDED. ONLY TAKE 1 FULL TABLET (20 MEQ) IF YOU TAKE LASIX., Disp: 30 tablet, Rfl: 11   senna-docusate (SENOKOT-S) 8.6-50 MG tablet, Take 1 tablet by mouth at bedtime as needed for mild constipation., Disp: , Rfl:    triamcinolone cream (KENALOG) 0.1 %, Apply 1 Application topically 2 (two) times daily as needed., Disp: , Rfl:    vitamin B-12 (CYANOCOBALAMIN) 1000 MCG tablet, Take 1,000 mcg by mouth daily., Disp: , Rfl:    nitroGLYCERIN (NITROSTAT) 0.4 MG SL tablet, Place 1 tablet (0.4 mg total) under the tongue every 5 (five) minutes as needed for chest pain. (Patient not taking: Reported on 01/10/2023), Disp: 100 tablet, Rfl: 3  Allergies:  Allergies  Allergen Reactions   Penicillins Anaphylaxis and Swelling    FACIAL SWELLING PATIENT HAS HAD A PCN REACTION WITH IMMEDIATE RASH, FACIAL/TONGUE/THROAT SWELLING, SOB, OR LIGHTHEADEDNESS WITH HYPOTENSION:  #  #  YES  #  #  Has patient had a PCN reaction causing severe rash involving mucus membranes or skin necrosis: No Has patient had a PCN reaction that required hospitalization: No Has patient had a PCN reaction occurring within the last 10 years: No   Propoxyphene N-Acetaminophen Swelling    tongue swelling   Levaquin [Levofloxacin] Hives   Repatha [Evolocumab] Other (See Comments)    Myalgias, fatigue, some itching and burning on her feet,  Arthralgia (Joint Pain)   Statins Other (See Comments)    Joint pain   Cortisone Rash   Cymbalta [Duloxetine Hcl] Itching and Rash   Myrbetriq [Mirabegron] Rash    Rash on face   Pseudoephedrine Other (See Comments)    Facial flushing    Past Medical History, Surgical history, Social history, and Family History were reviewed and updated.  Review of Systems: Review of Systems  Constitutional: Negative.   HENT: Negative.    Eyes: Negative.   Respiratory:  Negative.    Cardiovascular: Negative.   Gastrointestinal: Negative.   Genitourinary: Negative.   Musculoskeletal:  Positive for back pain and joint pain.  Skin: Negative.   Neurological:  Positive for focal weakness.  Endo/Heme/Allergies: Negative.   Psychiatric/Behavioral: Negative.       Physical Exam:  height is 5\' 8"  (1.727 m) and weight is 147 lb (66.7 kg). Her oral temperature is 98.8 F (37.1 C). Her blood pressure is 133/86 and her pulse is  70. Her respiration is 20 and oxygen saturation is 100%.   Physical Exam Vitals reviewed.  HENT:     Head: Normocephalic and atraumatic.  Eyes:     Pupils: Pupils are equal, round, and reactive to light.  Cardiovascular:     Rate and Rhythm: Normal rate and regular rhythm.     Heart sounds: Normal heart sounds.     Comments: Cardiac exam shows a regular rate and rhythm.  She has a 2/6 systolic ejection murmur. Pulmonary:     Effort: Pulmonary effort is normal.     Breath sounds: Normal breath sounds.  Abdominal:     General: Bowel sounds are normal.     Palpations: Abdomen is soft.  Musculoskeletal:        General: No tenderness or deformity. Normal range of motion.     Cervical back: Normal range of motion.  Lymphadenopathy:     Cervical: No cervical adenopathy.  Skin:    General: Skin is warm and dry.     Findings: No erythema or rash.  Neurological:     Mental Status: She is alert and oriented to person, place, and time.  Psychiatric:        Behavior: Behavior normal.        Thought Content: Thought content normal.        Judgment: Judgment normal.     Lab Results  Component Value Date   WBC 5.2 01/10/2023   HGB 13.7 01/10/2023   HCT 41.8 01/10/2023   MCV 102.0 (H) 01/10/2023   PLT 171 01/10/2023     Chemistry      Component Value Date/Time   NA 138 12/08/2022 1623   NA 140 06/09/2017 0758   NA 141 10/11/2016 0844   K 3.9 12/08/2022 1623   K 4.5 06/09/2017 0758   K 4.1 10/11/2016 0844   CL 103  12/08/2022 1623   CL 106 06/09/2017 0758   CO2 25 12/08/2022 1623   CO2 28 06/09/2017 0758   CO2 25 10/11/2016 0844   BUN 21 12/08/2022 1623   BUN 17 06/09/2017 0758   BUN 20.3 10/11/2016 0844   CREATININE 1.44 (H) 12/08/2022 1623   CREATININE 1.07 (H) 11/10/2021 0913   CREATININE 1.2 06/09/2017 0758   CREATININE 1.1 10/11/2016 0844      Component Value Date/Time   CALCIUM 9.1 12/08/2022 1623   CALCIUM 9.1 06/09/2017 0758   CALCIUM 9.5 10/11/2016 0844   ALKPHOS 69 12/08/2022 1623   ALKPHOS 49 06/09/2017 0758   ALKPHOS 52 10/11/2016 0844   AST 21 12/08/2022 1623   AST 11 (L) 11/10/2021 0913   AST 13 10/11/2016 0844   ALT 28 12/08/2022 1623   ALT 12 11/10/2021 0913   ALT 19 06/09/2017 0758   ALT 10 10/11/2016 0844   BILITOT 0.5 12/08/2022 1623   BILITOT 0.6 11/10/2021 0913   BILITOT 0.83 10/11/2016 0844         Impression and Plan: Ms. Maurine CaneKinley is 88 year-old white female with stage IIA ductal carcinoma of the left breast. She had 4 positive lymph nodes. Her breast cancer was triple positive. She completed her adjuvant chemotherapy. She completed this in July of 2015.  She then went on to complete adjuvant Herceptin in June 2016.  Again, we can have to evaluate this nodule.  I believe that is going to be larger.  Hopefully, it can still be in the purview of radiosurgery.  Will try to get a CT of the chest  today on her.  We do not need contrast.  We will then have to see about getting her back to Radiation Oncology.  I am glad that she finally came in to see [place].  I know she has had a quite a few issues since we last saw her.  I would like to see her back probably in about 6 weeks or so.    Josph Macho, MD 4/8/202412:41 PM

## 2023-01-11 ENCOUNTER — Telehealth: Payer: Self-pay

## 2023-01-11 NOTE — Telephone Encounter (Signed)
-----   Message from Josph Macho, MD sent at 01/10/2023  4:51 PM EDT ----- Call her daughter and let her know that the left upper lung nodule actually has not grown in over a year.  This is good news.  I will still have to let Radiation Oncology know so they can see her.  Thanks.  Cindee Lame

## 2023-01-11 NOTE — Telephone Encounter (Signed)
Called daughter, Darl Pikes (ok per [facility]) and informed her of patient's results. Darl Pikes verbalized understanding and denies any questions or concerns at this time. Central scheduling number given for her to contact if she does not hear from them soon.

## 2023-01-18 NOTE — Progress Notes (Signed)
Radiation Oncology         [phone] ________________________________  Outpatient Re-Consultation  Name: Kim [name] MRN: [id number]  Date: 01/19/2023  DOB: [birth year]  UY:[id number], Eliberto Ivory, DO  Ennever, Rose Phi, MD   REFERRING PHYSICIAN: Josph Macho, MD  DIAGNOSIS: The encounter diagnosis was Nodule of upper lobe of left lung [R91.1].  LUL nodule, PET positive    History of left breast cancer in 2015; s/p left lumpectomy, chemotherapy, XRT and antiestrogens  HISTORY OF PRESENT ILLNESS::Kim [name] is a 88 y.o. female who is accompanied by her daughter. she is seen as a courtesy of Dr. Myna Hidalgo for re-evaluation and an opinion concerning radiation therapy as part of management for her diagnosed LUL pulmonary nodule. I last met with the patient in consultation on 02/03/22 for consideration radiotherapy to the PET positive LUL nodule. Her lesion appeared to be low-grade and without characteristics of a fast-growing lung cancer. To review, we discussed that the lesion would be ideal for 3 fractions of SBRT treatments. However, due to her significant medical issues, she was unable to proceed with radiation therapy and continued with close follow-up with Dr. Myna Hidalgo.   A month after our consultation, the patient was hospitalized from 03/11/22 through 03/18/22 due to fall resulting in a right femoral fracture. She was also found to have severe focal basal septal left ventricular hypertrophy. She underwent right hip repair on 06/12. She did have a chest x-ray performed while inpatient for pre-op evaluation which showed evidence of cardiomegaly but no abnormal pulmonary findings. (Other imaging performed while inpatient included a CT of the lumbar spine which was without evidence of acute trauma to the lumbar spine).   In more recent history, the patient presented for a follow-up chest CT without contrast on 01/10/23 which showed no significant change in spiculated left upper lobe  pulmonary nodule, other abnormalities in the chest, or evidence of metastatic disease. The other smaller pulmonary nodules also appeared to remain unchanged and are thus likely benign.   The patient also followed up with Dr. Myna Hidalgo on 01/10/23. During this visit, the patient denied ant respiratory concerns.   Although her recent chest CT shows stability of the LUL nodule, Dr. Myna Hidalgo would still like her to receive radiation therapy if possible which we will discuss in detail today.    PREVIOUS RADIATION THERAPY: Yes - Dr. Basilio Cairo in 2015   Indication for treatment:  Curative      Radiation treatment dates:   05/14/2014 - 06/26/2014 Site/dose:       1) Left Breast  / 50 Gy in 25 fractions 2) Left Supraclavicular fossa/ 47.5 Gy in 25 fractions 3) Left Posterior Axillary boost / 4.825 Gy in 25 fractions 4) Left Breast boost / 10 Gy in 5 fractions Beams/energy:   1) Opposed tangents / 10 and 6 MV photons 2) Right anterior oblique / 10 MV photons 3) PA / photons 4) En face electrons / 12 MeV electrons   PREVIOUS OTHER THERAPY: -- Chemotherapy 12/17/2013 through 03/25/2015 consisting of 1 dose of Carboplatin/Taxotere and Trastuzumab on 12/17/13, and 4 cycles of Abraxane and Trastuzumab 02/05/11-04/18/14. (She continue Trastuzumab until 03/25/15) -- Antiestrogens consisting of Exemestane 2.5 mg daily started 07/01/14  PAST MEDICAL HISTORY:  Past Medical History:  Diagnosis Date   Allergic rhinitis    Allergy    Arthritis    hands   Breast cancer    Breast cancer of upper-outer quadrant of left female breast 11/08/2013   finished chemo  and radiation 2015   CAD (coronary artery disease)    a. 12/2013 Cath/PCI: LM nl, LAD 20p, 69m, 30d, D1 30p, LCX 20p, 53m, OM1 20, Mo2 40p, RCA 30p, 40m (4.0x16 Promus Premier DES), 20d, RPL 30, RPDA 70p, 1m, EF 65-70%.   CHF (congestive heart failure)    Diverticulosis    HOH (hard of hearing)    bilateral, wears hearing aids   Hx of adenomatous  colonic polyps 02/1999   Hyperlipidemia    Hypertension    Hypertrophic obstructive cardiomyopathy (HOCM)    Cardiologist is Dr. Marca Ancona   Hypothyroidism    Iron deficiency anemia due to chronic blood loss 01/07/2016   Low back pain    gets ESI from Dr. Sharolyn Douglas   Malabsorption of iron 01/07/2016   Myocardial infarction 12/2013   very mild-with first chemo -placed stent x1   Persistent atrial fibrillation with rapid ventricular response 10/14/2018   Personal history of radiation therapy 2015   left breast, scv, axilla  05/14/2014-06/26/2014  Dr Lonie Peak   S/P radiation therapy 05/14/2014-06/26/2014   1) Left Breast  / 50 Gy in 25 fractions, 2) Left Supraclavicular fossa/ 47.5 Gy in 25 fractions, 3) Left Posterior Axillary boost / 4.825 Gy in 25 fractions, 4) Left Breast boost / 10 Gy in 5 fractions    Wears hearing aid    left and right     PAST SURGICAL HISTORY: Past Surgical History:  Procedure Laterality Date   ABDOMINAL HYSTERECTOMY     ANTERIOR AND POSTERIOR REPAIR N/A 01/17/2015   Procedure: CYSTOCELE REPAIR ;  Surgeon: Genia Del, MD;  Location: [facility];  Service: Gynecology;  Laterality: N/A;   BILATERAL SALPINGOOPHORECTOMY     see Debbora Dus NP for GYN exams   BREAST BIOPSY Left 10/18/2013   BREAST BIOPSY Left 10/31/2013   BREAST LUMPECTOMY Left 11/05/2013   BREAST LUMPECTOMY WITH NEEDLE LOCALIZATION AND AXILLARY LYMPH NODE DISSECTION Left 11/05/2013   Procedure: LEFT BREAST LUMPECTOMY WITH NEEDLE LOCALIZATION and axillary lymph Node Dissection;  Surgeon: Mariella Saa, MD;  Location: [facility];  Service: General;  Laterality: Left;   BREAST SURGERY  11/2013   left lumpectomy   CARDIOVERSION N/A 10/17/2018   Procedure: CARDIOVERSION;  Surgeon: Laurey Morale, MD;  Location: [facility] ENDOSCOPY;  Service: Cardiovascular;  Laterality: N/A;   CARDIOVERSION N/A 11/08/2018   Procedure: CARDIOVERSION;  Surgeon: Laurey Morale, MD;  Location: [facility]  ENDOSCOPY;  Service: Cardiovascular;  Laterality: N/A;   CARDIOVERSION N/A 08/13/2020   Procedure: CARDIOVERSION;  Surgeon: Laurey Morale, MD;  Location: [facility] ENDOSCOPY;  Service: Cardiovascular;  Laterality: N/A;   CARDIOVERSION N/A 03/13/2021   Procedure: CARDIOVERSION;  Surgeon: Laurey Morale, MD;  Location: [facility] ENDOSCOPY;  Service: Cardiovascular;  Laterality: N/A;   CARDIOVERSION N/A 11/26/2021   Procedure: CARDIOVERSION;  Surgeon: Laurey Morale, MD;  Location: [facility] ENDOSCOPY;  Service: Cardiovascular;  Laterality: N/A;   CATARACT EXTRACTION W/ INTRAOCULAR LENS  IMPLANT, BILATERAL  2012   bilateral   COLONOSCOPY  11/04/2015   per Dr. Russella Dar, adenomatous polyps, no repeats planned    CONVERSION TO TOTAL HIP Right 03/15/2022   Procedure: CONVERSION TO TOTAL HIP;  Surgeon: Durene Romans, MD;  Location: [facility];  Service: Orthopedics;  Laterality: Right;  Dr. Charlann Boxer requests to start at 3:00pm   EYE SURGERY  11/22/2006   cataracts, bilateral, intraocular lens implant   INTRAMEDULLARY (IM) NAIL INTERTROCHANTERIC Right 08/23/2021   Procedure: INTRAMEDULLARY (IM) NAIL INTERTROCHANTRIC;  Surgeon: Durene Romans, MD;  Location: [facility] OR;  Service: Orthopedics;  Laterality: Right;   JOINT REPLACEMENT     2019   LEFT HEART CATHETERIZATION WITH CORONARY ANGIOGRAM N/A 12/19/2013   Procedure: LEFT HEART CATHETERIZATION WITH CORONARY ANGIOGRAM;  Surgeon: Kathleene Hazel, MD;  Location: [facility] CATH LAB;  Service: Cardiovascular;  Laterality: N/A;   lumbar epidural steroid injection     lumber spinal stenosis   LUMBAR LAMINECTOMY/DECOMPRESSION MICRODISCECTOMY Right 03/01/2018   Procedure: RIGHT LUMBAR FOUR- LUMBAR FIVE LAMINECTOMY/MICRODISCECTOMY;  Surgeon: Shirlean Kelly, MD;  Location: [facility] OR;  Service: Neurosurgery;  Laterality: Right;   MITRAL VALVE REPAIR N/A 12/06/2018   Procedure: MITRAL VALVE REPAIR;  Surgeon: Tonny Bollman, MD;  Location: [facility] INVASIVE CV LAB;  Service: Cardiovascular;  Laterality:  N/A;   POLYPECTOMY     PORTACATH PLACEMENT Right 12/11/2013   Procedure: INSERTION PORT-A-CATH;  Surgeon: Mariella Saa, MD;  Location: [facility];  Service: General;  Laterality: Right;  Subclavian Vein;    RIGHT/LEFT HEART CATH AND CORONARY ANGIOGRAPHY N/A 11/03/2018   Procedure: RIGHT/LEFT HEART CATH AND CORONARY ANGIOGRAPHY;  Surgeon: Laurey Morale, MD;  Location: [facility] INVASIVE CV LAB;  Service: Cardiovascular;  Laterality: N/A;   TEE WITHOUT CARDIOVERSION N/A 10/17/2018   Procedure: TRANSESOPHAGEAL ECHOCARDIOGRAM (TEE);  Surgeon: Laurey Morale, MD;  Location: [facility] ENDOSCOPY;  Service: Cardiovascular;  Laterality: N/A;   TEE WITHOUT CARDIOVERSION N/A 11/08/2018   Procedure: TRANSESOPHAGEAL ECHOCARDIOGRAM (TEE);  Surgeon: Laurey Morale, MD;  Location: [facility] ENDOSCOPY;  Service: Cardiovascular;  Laterality: N/A;   TOTAL KNEE ARTHROPLASTY Right 10/24/2017   Procedure: TOTAL RIGHT KNEE ARTHROPLASTY;  Surgeon: Ollen Gross, MD;  Location: [facility];  Service: Orthopedics;  Laterality: Right;    FAMILY HISTORY:  Family History  Problem Relation Age of Onset   Alcohol abuse Father    Kidney failure Mother    Colon cancer Neg Hx    Rectal cancer Neg Hx    Stomach cancer Neg Hx    Colon polyps Neg Hx     SOCIAL HISTORY:  Social History   Tobacco Use   Smoking status: Never   Smokeless tobacco: Never   Tobacco comments:    never used tobacco  Vaping Use   Vaping Use: Never used  Substance Use Topics   Alcohol use: No    Alcohol/week: 0.0 standard drinks of alcohol   Drug use: No    ALLERGIES:  Allergies  Allergen Reactions   Penicillins Anaphylaxis and Swelling    FACIAL SWELLING PATIENT HAS HAD A PCN REACTION WITH IMMEDIATE RASH, FACIAL/TONGUE/THROAT SWELLING, SOB, OR LIGHTHEADEDNESS WITH HYPOTENSION:  #  #  YES  #  #  Has patient had a PCN reaction causing severe rash involving mucus membranes or skin necrosis: No Has patient had a PCN reaction that  required hospitalization: No Has patient had a PCN reaction occurring within the last 10 years: No   Propoxyphene N-Acetaminophen Swelling    tongue swelling   Levaquin [Levofloxacin] Hives   Repatha [Evolocumab] Other (See Comments)    Myalgias, fatigue, some itching and burning on her feet,  Arthralgia (Joint Pain)   Statins Other (See Comments)    Joint pain   Cortisone Rash   Cymbalta [Duloxetine Hcl] Itching and Rash   Myrbetriq [Mirabegron] Rash    Rash on face   Pseudoephedrine Other (See Comments)    Facial flushing    MEDICATIONS:  Current Outpatient Medications  Medication Sig Dispense Refill   acetaminophen (  TYLENOL) 500 MG tablet Take 1,000 mg by mouth 2 (two) times daily.     amoxicillin (AMOXIL) 500 MG capsule Take 2,000 mg by mouth. One hour before dental work.     diclofenac Sodium (VOLTAREN) 1 % GEL Apply 4 g topically 2 (two) times daily as needed (knee pain).     diltiazem (CARDIZEM CD) 240 MG 24 hr capsule Take 1 capsule (240 mg total) by mouth daily. (Patient not taking: Reported on 01/19/2023)     docusate sodium (COLACE) 100 MG capsule Take 100 mg by mouth as needed for mild constipation.     ELIQUIS 5 MG TABS tablet TAKE 1 TABLET BY MOUTH TWICE (2) DAILY 180 tablet 1   exemestane (AROMASIN) 25 MG tablet TAKE 1 TABLET BY MOUTH ONCE DAILY AFTER BREAKFAST 90 tablet 3   feeding supplement (ENSURE ENLIVE / ENSURE PLUS) LIQD Take 237 mLs by mouth 2 (two) times daily between meals. 237 mL 12   ferrous gluconate (FERGON) 324 MG tablet Take 324 mg by mouth daily with lunch.     furosemide (LASIX) 40 MG tablet TAKE 1 TABLET BY MOUTH AS NEEDED FOR FLUID OR EDEMA. . 30 tablet 11   gabapentin (NEURONTIN) 100 MG capsule Take 100 mg by mouth daily.     levothyroxine (SYNTHROID) 137 MCG tablet Take 137 mcg by mouth daily before breakfast.     menthol-cetylpyridinium (CEPACOL) 3 MG lozenge Take 1 lozenge (3 mg total) by mouth as needed for sore throat. 100 tablet 12    metoprolol succinate (TOPROL-XL) 25 MG 24 hr tablet Take 1 tablet (25 mg total) by mouth 2 (two) times daily. 180 tablet 3   Multiple Vitamin (MULTIVITAMIN WITH MINERALS) TABS tablet Take 1 tablet by mouth daily.     nitroGLYCERIN (NITROSTAT) 0.4 MG SL tablet Place 1 tablet (0.4 mg total) under the tongue every 5 (five) minutes as needed for chest pain. (Patient not taking: Reported on 01/10/2023) 100 tablet 3   ondansetron (ZOFRAN) 4 MG tablet Take 1 tablet (4 mg total) by mouth every 6 (six) hours as needed for nausea. 20 tablet 0   polyethylene glycol (MIRALAX / GLYCOLAX) 17 g packet Take 17 g by mouth daily as needed for mild constipation. 14 each 0   potassium chloride SA (KLOR-CON M) 20 MEQ tablet TAKE 1/2 TO 1 TABLET (10-20 MEQ TOTAL) BY MOUTH AS NEEDED. ONLY TAKE 1 FULL TABLET (20 MEQ) IF YOU TAKE LASIX. 30 tablet 11   senna-docusate (SENOKOT-S) 8.6-50 MG tablet Take 1 tablet by mouth at bedtime as needed for mild constipation.     triamcinolone cream (KENALOG) 0.1 % Apply 1 Application topically 2 (two) times daily as needed.     vitamin B-12 (CYANOCOBALAMIN) 1000 MCG tablet Take 1,000 mcg by mouth daily.     No current facility-administered medications for this encounter.    REVIEW OF SYSTEMS:  A 10+ POINT REVIEW OF SYSTEMS WAS OBTAINED including neurology, dermatology, psychiatry, cardiac, respiratory, lymph, extremities, GI, GU, musculoskeletal, constitutional, reproductive, HEENT.  She denies any pain within the chest area, significant cough or hemoptysis.   PHYSICAL EXAM:  height is 5\' 8"  (1.727 m) and weight is 149 lb (67.6 kg). Her temperature is 98.5 F (36.9 C). Her blood pressure is 129/76 and her pulse is 69. Her respiration is 18 and oxygen saturation is 98%.   General: Alert and oriented, in no acute distress, hard of hearing HEENT: Head is normocephalic. Extraocular movements are intact.  Neck: Neck is supple,  no palpable cervical or supraclavicular lymphadenopathy. Heart:  Regular in rate and rhythm with no murmurs, rubs, or gallops. Chest: Clear to auscultation bilaterally, with no rhonchi, wheezes, or rales. Abdomen: Soft, nontender, nondistended, with no rigidity or guarding. Extremities: No cyanosis or edema. Lymphatics: see Neck Exam Skin: No concerning lesions. Musculoskeletal: symmetric strength and muscle tone throughout. Neurologic: Cranial nerves II through XII are grossly intact. No obvious focalities. Speech is fluent. Coordination is intact. Psychiatric: Judgment and insight are intact. Affect is appropriate.   ECOG = 1  0 - Asymptomatic (Fully active, able to carry on all predisease activities without restriction)  1 - Symptomatic but completely ambulatory (Restricted in physically strenuous activity but ambulatory and able to carry out work of a light or sedentary nature. For example, light housework, office work)  2 - Symptomatic, <50% in bed during the day (Ambulatory and capable of all self care but unable to carry out any work activities. Up and about more than 50% of waking hours)  3 - Symptomatic, >50% in bed, but not bedbound (Capable of only limited self-care, confined to bed or chair 50% or more of waking hours)  4 - Bedbound (Completely disabled. Cannot carry on any self-care. Totally confined to bed or chair)  5 - Death   Santiago Glad MM, Creech RH, Tormey DC, et al. [phone]). "Toxicity and response criteria of the [facility]". Am. Evlyn Clines. Oncol. 5 (6): 649-55  LABORATORY DATA:  Lab Results  Component Value Date   WBC 5.2 01/10/2023   HGB 13.7 01/10/2023   HCT 41.8 01/10/2023   MCV 102.0 (H) 01/10/2023   PLT 171 01/10/2023   NEUTROABS 3.7 01/10/2023   Lab Results  Component Value Date   NA 138 01/10/2023   K 4.7 01/10/2023   CL 102 01/10/2023   CO2 30 01/10/2023   GLUCOSE 75 01/10/2023   BUN 23 01/10/2023   CREATININE 1.52 (H) 01/10/2023   CALCIUM 9.4 01/10/2023      RADIOGRAPHY: CT Chest Wo  Contrast  Result Date: 01/10/2023 CLINICAL DATA:  Non-small-cell lung cancer. Left upper lobe nodule 1 year ago. Not being treated. Evaluate for interval growth. History of breast cancer. * Tracking Code: BO * EXAM: CT CHEST WITHOUT CONTRAST TECHNIQUE: Multidetector CT imaging of the chest was performed following the standard protocol without IV contrast. RADIATION DOSE REDUCTION: This exam was performed according to the departmental dose-optimization program which includes automated exposure control, adjustment of the mA and/or kV according to patient size and/or use of iterative reconstruction technique. COMPARISON:  CT 10/15/2021.  PET 10/30/2021. FINDINGS: Cardiovascular: Tortuous thoracic aorta. Aortic atherosclerosis. Moderate cardiomegaly with trace pericardial fluid or thickening. Three vessel coronary artery calcification. Mitral valve clip again identified. Pulmonary artery enlargement, outflow tract 3.3 cm. Mediastinum/Nodes: Left axillary node dissection. No axillary adenopathy. No mediastinal or hilar adenopathy, given limitations of unenhanced CT. Lungs/Pleura: No pleural fluid. Mild biapical pleuroparenchymal scarring. Subpleural right lower lobe 3 mm pulmonary nodule is similar on 71/3. A left lower lobe subpleural 4 mm nodule is unchanged on 84/3. The spiculated posterior left upper lobe pulmonary nodule measures 1.1 x 1.3 cm on 51/3 versus 1.2 x 1.2 cm when measured in a similar fashion on the prior. Based on sagittal reformats, 1.5 cm craniocaudal on 130/6 versus similar (when remeasured). Upper Abdomen: Gallstones. Liver hypoattenuation is nonspecific but can be seen with amiodarone exposure. Normal imaged portions of the spleen, stomach, right adrenal gland, right kidney. Musculoskeletal: Left sided lumpectomy.  Osteopenia. IMPRESSION:  1. No significant change in spiculated left upper lobe pulmonary nodule. 2. No evidence of metastatic disease or other acute process in the chest. 3. The other  smaller pulmonary nodules are unchanged and can be presumed benign. 4. Cholelithiasis 5. Coronary artery atherosclerosis. Aortic Atherosclerosis (ICD10-I70.0). 6. Pulmonary artery enlargement suggests pulmonary arterial hypertension. Electronically Signed   By: Jeronimo Greaves M.D.   On: 01/10/2023 14:59      IMPRESSION: LUL nodule, PET positive   She was found to have a solitary left upper lobe pulmonary nodule which was positive on PET.  Given her age and other medical issues she does not wish to consider biopsy of this area or surgical management of this issue.  We discussed that this lesion has not changed significantly in size but ideally would want to treat while the lesion is small with a better chance for control.  One option would be to repeat chest CT scan in 6 months.  If the lesion enlarges at that point then she could proceed with SBRT.  After careful consideration the patient and her daughter did wish that she proceed with SBRT treatment at this time.  Today, I talked to the patient and daughter about the findings and work-up thus far.  The nodule was reviewed on CT and PET scan images.  we discussed the natural history of presumptive non-small cell lung cancer and general treatment, highlighting the role of radiotherapy (SBRT) in the management.  We discussed the available radiation techniques, and focused on the details of logistics and delivery.  We reviewed the anticipated acute and late sequelae associated with radiation in this setting.  The patient was encouraged to ask questions that I answered to the best of my ability.  A patient consent form was discussed and signed.  We retained a copy for our records.  The patient would like to proceed with radiation and will be scheduled for CT simulation.  PLAN: She will proceed with SBRT simulation next week.  Anticipate 3 SBRT sessions directed at the solitary nodule in the left upper lobe.   60 minutes of total time was spent for this  patient encounter, including preparation, face-to-face counseling with the patient and coordination of care, physical exam, and documentation of the encounter.   ------------------------------------------------  Billie Lade, PhD, MD  This document serves as a record of services personally performed by Antony Blackbird, MD. It was created on his behalf by Neena Rhymes, a trained medical scribe. The creation of this record is based on the scribe's personal observations and the provider's statements to them. This document has been checked and approved by the attending provider.

## 2023-01-19 ENCOUNTER — Ambulatory Visit: Payer: Medicare PPO

## 2023-01-19 ENCOUNTER — Ambulatory Visit: Payer: Medicare PPO | Admitting: Radiation Oncology

## 2023-01-19 ENCOUNTER — Encounter: Payer: Self-pay | Admitting: Radiation Oncology

## 2023-01-19 ENCOUNTER — Ambulatory Visit
Admission: RE | Admit: 2023-01-19 | Discharge: 2023-01-19 | Disposition: A | Discharge status: Home / Self Care | Payer: Medicare PPO | Source: Ambulatory Visit | Attending: Radiation Oncology | Admitting: Radiation Oncology

## 2023-01-19 VITALS — BP 129/76 | HR 69 | Temp 98.5°F | Resp 18 | Wt 149.0 lb

## 2023-01-19 VITALS — BP 129/76 | HR 69 | Temp 98.5°F | Resp 18 | Ht 68.0 in | Wt 149.0 lb

## 2023-01-19 DIAGNOSIS — I509 Heart failure, unspecified: Secondary | ICD-10-CM | POA: Insufficient documentation

## 2023-01-19 DIAGNOSIS — I2510 Atherosclerotic heart disease of native coronary artery without angina pectoris: Secondary | ICD-10-CM | POA: Diagnosis not present

## 2023-01-19 DIAGNOSIS — Z79811 Long term (current) use of aromatase inhibitors: Secondary | ICD-10-CM | POA: Insufficient documentation

## 2023-01-19 DIAGNOSIS — Z79899 Other long term (current) drug therapy: Secondary | ICD-10-CM | POA: Diagnosis not present

## 2023-01-19 DIAGNOSIS — Z79890 Hormone replacement therapy (postmenopausal): Secondary | ICD-10-CM | POA: Diagnosis not present

## 2023-01-19 DIAGNOSIS — Z7901 Long term (current) use of anticoagulants: Secondary | ICD-10-CM | POA: Insufficient documentation

## 2023-01-19 DIAGNOSIS — Z923 Personal history of irradiation: Secondary | ICD-10-CM | POA: Insufficient documentation

## 2023-01-19 DIAGNOSIS — C50412 Malignant neoplasm of upper-outer quadrant of left female breast: Secondary | ICD-10-CM | POA: Insufficient documentation

## 2023-01-19 DIAGNOSIS — E785 Hyperlipidemia, unspecified: Secondary | ICD-10-CM | POA: Diagnosis not present

## 2023-01-19 DIAGNOSIS — I10 Essential (primary) hypertension: Secondary | ICD-10-CM | POA: Insufficient documentation

## 2023-01-19 DIAGNOSIS — Z791 Long term (current) use of non-steroidal anti-inflammatories (NSAID): Secondary | ICD-10-CM | POA: Diagnosis not present

## 2023-01-19 DIAGNOSIS — I252 Old myocardial infarction: Secondary | ICD-10-CM | POA: Diagnosis not present

## 2023-01-19 DIAGNOSIS — Z170 Estrogen receptor positive status [ER+]: Secondary | ICD-10-CM | POA: Insufficient documentation

## 2023-01-19 DIAGNOSIS — R911 Solitary pulmonary nodule: Secondary | ICD-10-CM | POA: Insufficient documentation

## 2023-01-19 DIAGNOSIS — D509 Iron deficiency anemia, unspecified: Secondary | ICD-10-CM | POA: Diagnosis not present

## 2023-01-19 DIAGNOSIS — C3412 Malignant neoplasm of upper lobe, left bronchus or lung: Secondary | ICD-10-CM | POA: Diagnosis not present

## 2023-01-19 DIAGNOSIS — E039 Hypothyroidism, unspecified: Secondary | ICD-10-CM | POA: Diagnosis not present

## 2023-01-19 NOTE — Progress Notes (Signed)
Location of tumor and Biopsy: NA  Past/Anticipated interventions by surgeon, if any: None  Past/Anticipated interventions by medical oncology, if any:     Pain issues, if any:  no   SAFETY ISSUES: Prior radiation? Yes  Pacemaker/ICD? no Possible current pregnancy? no Is the patient on methotrexate? no  Current Complaints / other details:  Patient denies any coughing, shortness of breath or    Hemoptysis.   BP 129/76   Pulse 69   Temp 98.5 F (36.9 C)   Resp 18   Ht 5\' 8"  (1.727 m)   Wt 149 lb (67.6 kg)   LMP 10/19/1991   SpO2 98%   BMI 22.66 kg/m

## 2023-01-20 DIAGNOSIS — I5032 Chronic diastolic (congestive) heart failure: Secondary | ICD-10-CM | POA: Diagnosis not present

## 2023-01-25 ENCOUNTER — Ambulatory Visit
Admission: RE | Admit: 2023-01-25 | Discharge: 2023-01-25 | Disposition: A | Discharge status: Home / Self Care | Payer: Medicare PPO | Source: Ambulatory Visit | Attending: Radiation Oncology | Admitting: Radiation Oncology

## 2023-01-25 DIAGNOSIS — Z853 Personal history of malignant neoplasm of breast: Secondary | ICD-10-CM | POA: Insufficient documentation

## 2023-01-25 DIAGNOSIS — R911 Solitary pulmonary nodule: Secondary | ICD-10-CM

## 2023-01-25 DIAGNOSIS — Z510 Encounter for antineoplastic radiation therapy: Secondary | ICD-10-CM | POA: Diagnosis not present

## 2023-01-25 DIAGNOSIS — R918 Other nonspecific abnormal finding of lung field: Secondary | ICD-10-CM | POA: Insufficient documentation

## 2023-01-25 DIAGNOSIS — C3412 Malignant neoplasm of upper lobe, left bronchus or lung: Secondary | ICD-10-CM | POA: Diagnosis not present

## 2023-01-26 DIAGNOSIS — C3412 Malignant neoplasm of upper lobe, left bronchus or lung: Secondary | ICD-10-CM | POA: Diagnosis not present

## 2023-02-01 DIAGNOSIS — R918 Other nonspecific abnormal finding of lung field: Secondary | ICD-10-CM | POA: Diagnosis not present

## 2023-02-01 DIAGNOSIS — Z510 Encounter for antineoplastic radiation therapy: Secondary | ICD-10-CM | POA: Diagnosis not present

## 2023-02-01 DIAGNOSIS — Z853 Personal history of malignant neoplasm of breast: Secondary | ICD-10-CM | POA: Diagnosis not present

## 2023-02-01 DIAGNOSIS — C3412 Malignant neoplasm of upper lobe, left bronchus or lung: Secondary | ICD-10-CM | POA: Diagnosis not present

## 2023-02-08 ENCOUNTER — Ambulatory Visit
Admission: RE | Admit: 2023-02-08 | Discharge: 2023-02-08 | Disposition: A | Discharge status: Home / Self Care | Payer: Medicare PPO | Source: Ambulatory Visit | Attending: Radiation Oncology | Admitting: Radiation Oncology

## 2023-02-08 ENCOUNTER — Other Ambulatory Visit: Payer: Self-pay

## 2023-02-08 DIAGNOSIS — Z923 Personal history of irradiation: Secondary | ICD-10-CM | POA: Diagnosis not present

## 2023-02-08 DIAGNOSIS — R911 Solitary pulmonary nodule: Secondary | ICD-10-CM | POA: Insufficient documentation

## 2023-02-08 DIAGNOSIS — Z510 Encounter for antineoplastic radiation therapy: Secondary | ICD-10-CM | POA: Diagnosis not present

## 2023-02-08 DIAGNOSIS — Z853 Personal history of malignant neoplasm of breast: Secondary | ICD-10-CM | POA: Diagnosis not present

## 2023-02-08 LAB — RAD ONC ARIA SESSION SUMMARY
Course Elapsed Days: 0
Plan Fractions Treated to Date: 1
Plan Prescribed Dose Per Fraction: 18 Gy
Plan Total Fractions Prescribed: 3
Plan Total Prescribed Dose: 54 Gy
Reference Point Dosage Given to Date: 18 Gy
Reference Point Session Dosage Given: 18 Gy
Session Number: 1

## 2023-02-10 ENCOUNTER — Ambulatory Visit
Admission: RE | Admit: 2023-02-10 | Discharge: 2023-02-10 | Disposition: A | Discharge status: Home / Self Care | Payer: Medicare PPO | Source: Ambulatory Visit | Attending: Radiation Oncology | Admitting: Radiation Oncology

## 2023-02-10 ENCOUNTER — Other Ambulatory Visit: Payer: Self-pay

## 2023-02-10 DIAGNOSIS — R911 Solitary pulmonary nodule: Secondary | ICD-10-CM | POA: Diagnosis not present

## 2023-02-10 DIAGNOSIS — Z510 Encounter for antineoplastic radiation therapy: Secondary | ICD-10-CM | POA: Diagnosis not present

## 2023-02-10 DIAGNOSIS — Z853 Personal history of malignant neoplasm of breast: Secondary | ICD-10-CM | POA: Diagnosis not present

## 2023-02-10 DIAGNOSIS — Z923 Personal history of irradiation: Secondary | ICD-10-CM | POA: Diagnosis not present

## 2023-02-10 LAB — RAD ONC ARIA SESSION SUMMARY
Course Elapsed Days: 2
Plan Fractions Treated to Date: 2
Plan Prescribed Dose Per Fraction: 18 Gy
Plan Total Fractions Prescribed: 3
Plan Total Prescribed Dose: 54 Gy
Reference Point Dosage Given to Date: 36 Gy
Reference Point Session Dosage Given: 18 Gy
Session Number: 2

## 2023-02-14 ENCOUNTER — Ambulatory Visit
Admission: RE | Admit: 2023-02-14 | Discharge: 2023-02-14 | Disposition: A | Discharge status: Home / Self Care | Payer: Medicare PPO | Source: Ambulatory Visit | Attending: Radiation Oncology | Admitting: Radiation Oncology

## 2023-02-14 ENCOUNTER — Other Ambulatory Visit: Payer: Self-pay

## 2023-02-14 DIAGNOSIS — R911 Solitary pulmonary nodule: Secondary | ICD-10-CM

## 2023-02-14 DIAGNOSIS — Z510 Encounter for antineoplastic radiation therapy: Secondary | ICD-10-CM | POA: Diagnosis not present

## 2023-02-14 DIAGNOSIS — C3412 Malignant neoplasm of upper lobe, left bronchus or lung: Secondary | ICD-10-CM | POA: Diagnosis not present

## 2023-02-14 DIAGNOSIS — Z923 Personal history of irradiation: Secondary | ICD-10-CM | POA: Diagnosis not present

## 2023-02-14 DIAGNOSIS — Z853 Personal history of malignant neoplasm of breast: Secondary | ICD-10-CM | POA: Diagnosis not present

## 2023-02-14 LAB — RAD ONC ARIA SESSION SUMMARY
Course Elapsed Days: 6
Plan Fractions Treated to Date: 3
Plan Prescribed Dose Per Fraction: 18 Gy
Plan Total Fractions Prescribed: 3
Plan Total Prescribed Dose: 54 Gy
Reference Point Dosage Given to Date: 54 Gy
Reference Point Session Dosage Given: 18 Gy
Session Number: 3

## 2023-02-17 NOTE — Radiation Completion Notes (Signed)
Patient Name: Kim [name], Kim [name] MRN: [id number] Date of Birth: 1934-12-24 Referring Physician: Arlan Organ, M.D. Date of Service: 2023-02-17 Radiation Oncologist: Arnette Schaumann, M.D. [facility] [place]                             RADIATION ONCOLOGY END OF TREATMENT NOTE     Diagnosis: R91.1 Solitary pulmonary nodule Staging on 2013-10-26: DCIS (ductal carcinoma in situ) of breast T=T73mic, N=N2, M=cM0 Staging on 2013-10-26: DCIS (ductal carcinoma in situ) of breast T=Tis (DCIS), N=N0, M=cM0 Staging on 2013-11-06: Malignant neoplasm of upper-outer quadrant of left breast in female, estrogen receptor positive (HCC) T=Tis (DCIS), N=N2, M=cM0 Intent: Curative     ==========DELIVERED PLANS==========  First Treatment Date: 2023-02-08 - Last Treatment Date: 2023-02-14   Plan Name: Lung_L_SBRT Site: Lung, Left Technique: SBRT/SRT-IMRT Mode: Photon Dose Per Fraction: 18 Gy Prescribed Dose (Delivered / Prescribed): 54 Gy / 54 Gy Prescribed Fxs (Delivered / Prescribed): 3 / 3     ==========ON TREATMENT VISIT DATES========== 2023-02-08, 2023-02-10, 2023-02-14, 2023-02-14     ==========UPCOMING VISITS==========       ==========APPENDIX - ON TREATMENT VISIT NOTES==========   See weekly On Treatment Notes is Epic for details.

## 2023-02-19 DIAGNOSIS — I5032 Chronic diastolic (congestive) heart failure: Secondary | ICD-10-CM | POA: Diagnosis not present

## 2023-03-01 ENCOUNTER — Ambulatory Visit: Payer: Medicare PPO | Admitting: Hematology & Oncology

## 2023-03-01 ENCOUNTER — Other Ambulatory Visit: Payer: Medicare PPO

## 2023-03-22 DIAGNOSIS — I5032 Chronic diastolic (congestive) heart failure: Secondary | ICD-10-CM | POA: Diagnosis not present

## 2023-03-23 ENCOUNTER — Encounter: Payer: Self-pay | Admitting: Radiation Oncology

## 2023-03-23 NOTE — Progress Notes (Signed)
  Radiation Oncology         [phone] ________________________________  Name: Kim [name] MRN: [id number]  Date: 03/24/2023  DOB: 1935/06/14  End of Treatment Note  Diagnosis: The encounter diagnosis was Nodule of upper lobe of left lung [R91.1].   LUL nodule, PET positive    History of left breast cancer in 2015; s/p left lumpectomy, chemotherapy, XRT and antiestrogens     Indication for treatment: Curative        Radiation treatment dates: 02/08/23 through 02/14/23  Site/dose: Left Lung - 54 Gy delivered in 3 Fx at 18 Gy/Fx.    Beams/energy: 6X-FFF   Technique/Mode: SBRT/SRT-IMRT, Photon   Narrative: She tolerated the treatment very well without any significant side effects.  Plan: The patient has completed radiation treatment. The patient will return to radiation oncology clinic for routine followup in one month. I advised them to call or return sooner if they have any questions or concerns related to their recovery or treatment.  -----------------------------------  Billie Lade, PhD, MD  This document serves as a record of services personally performed by Antony Blackbird, MD. It was created on his behalf by Neena Rhymes, a trained medical scribe. The creation of this record is based on the scribe's personal observations and the provider's statements to them. This document has been checked and approved by the attending provider.

## 2023-03-23 NOTE — Progress Notes (Signed)
Radiation Oncology         [phone] ________________________________  Name: Kim [name] MRN: [id number]  Date: 03/24/2023  DOB: 11-16-1934  Follow-Up Visit Note  CC: Charlane Ferretti, DO  Josph Macho, MD  R91.1   ICD-10-CM   1. Nodule of upper lobe of left lung [R91.1]  R91.1 CT Chest Wo Contrast    2. Malignant neoplasm involving both nipple and areola of left breast in female, unspecified estrogen receptor status (HCC)  C50.012       Diagnosis: The encounter diagnosis was Nodule of upper lobe of left lung [R91.1].   LUL nodule, PET positive    History of left breast cancer in 2015; s/p left lumpectomy, chemotherapy, XRT and antiestrogens     Interval Since Last Radiation: 1 month and 7 days  Indication for treatment: Curative       Radiation treatment dates: 02/08/23 through 02/14/23 Site/dose: Left Lung - 54 Gy delivered in 3 Fx at 18 Gy/Fx.    Beams/energy: 6X-FFF  Technique/Mode: SBRT/SRT-IMRT, Photon   Narrative:  The patient returns today for routine follow-up. She tolerated the treatment very well without any significant side effects.        No other significant interval history since the patient completed radiation therapy or since starting radiation therapy.                       Patient is here today with her supportive daughter. She is doing well overall since the radiation. She denies any dyspnea on exertion, new cough, hemoptysis, or other breathing issues.   Allergies:  is allergic to penicillins, propoxyphene n-acetaminophen, levaquin [levofloxacin], repatha [evolocumab], statins, cortisone, cymbalta [duloxetine hcl], myrbetriq [mirabegron], and pseudoephedrine.  Meds: Current Outpatient Medications  Medication Sig Dispense Refill   acetaminophen (TYLENOL) 500 MG tablet Take 1,000 mg by mouth 2 (two) times daily.     diclofenac Sodium (VOLTAREN) 1 % GEL Apply 4 g topically 2 (two) times daily as needed (knee pain).     diltiazem (CARDIZEM CD)  240 MG 24 hr capsule Take 1 capsule (240 mg total) by mouth daily.     docusate sodium (COLACE) 100 MG capsule Take 100 mg by mouth as needed for mild constipation.     ELIQUIS 5 MG TABS tablet TAKE 1 TABLET BY MOUTH TWICE (2) DAILY 180 tablet 1   exemestane (AROMASIN) 25 MG tablet TAKE 1 TABLET BY MOUTH ONCE DAILY AFTER BREAKFAST 90 tablet 3   ferrous gluconate (FERGON) 324 MG tablet Take 324 mg by mouth daily with lunch.     gabapentin (NEURONTIN) 100 MG capsule Take 100 mg by mouth daily.     levothyroxine (SYNTHROID) 137 MCG tablet Take 137 mcg by mouth daily before breakfast.     menthol-cetylpyridinium (CEPACOL) 3 MG lozenge Take 1 lozenge (3 mg total) by mouth as needed for sore throat. 100 tablet 12   metoprolol succinate (TOPROL-XL) 25 MG 24 hr tablet Take 1 tablet (25 mg total) by mouth 2 (two) times daily. 180 tablet 3   Multiple Vitamin (MULTIVITAMIN WITH MINERALS) TABS tablet Take 1 tablet by mouth daily.     nitroGLYCERIN (NITROSTAT) 0.4 MG SL tablet Place 1 tablet (0.4 mg total) under the tongue every 5 (five) minutes as needed for chest pain. 100 tablet 3   ondansetron (ZOFRAN) 4 MG tablet Take 1 tablet (4 mg total) by mouth every 6 (six) hours as needed for nausea. 20 tablet 0   polyethylene  glycol (MIRALAX / GLYCOLAX) 17 g packet Take 17 g by mouth daily as needed for mild constipation. 14 each 0   potassium chloride SA (KLOR-CON M) 20 MEQ tablet TAKE 1/2 TO 1 TABLET (10-20 MEQ TOTAL) BY MOUTH AS NEEDED. ONLY TAKE 1 FULL TABLET (20 MEQ) IF YOU TAKE LASIX. 30 tablet 11   senna-docusate (SENOKOT-S) 8.6-50 MG tablet Take 1 tablet by mouth at bedtime as needed for mild constipation.     triamcinolone cream (KENALOG) 0.1 % Apply 1 Application topically 2 (two) times daily as needed.     vitamin B-12 (CYANOCOBALAMIN) 1000 MCG tablet Take 1,000 mcg by mouth daily.     amoxicillin (AMOXIL) 500 MG capsule Take 2,000 mg by mouth. One hour before dental work. (Patient not taking: Reported on  03/24/2023)     feeding supplement (ENSURE ENLIVE / ENSURE PLUS) LIQD Take 237 mLs by mouth 2 (two) times daily between meals. 237 mL 12   furosemide (LASIX) 40 MG tablet TAKE 1 TABLET BY MOUTH AS NEEDED FOR FLUID OR EDEMA. . 30 tablet 11   No current facility-administered medications for this encounter.    Physical Findings: The patient is in no acute distress. Patient is alert and oriented.  height is 5\' 9"  (1.753 m) and weight is 146 lb (66.2 kg). Her temperature is 96.8 F (36 C) (abnormal). Her blood pressure is 112/64 and her pulse is 64. Her respiration is 20 and oxygen saturation is 100%. .  No significant changes. Lungs are clear to auscultation bilaterally. Heart has regular rate and rhythm. No palpable cervical, supraclavicular, or axillary adenopathy. Abdomen soft, non-tender, normal bowel sounds.   Lab Findings: Lab Results  Component Value Date   WBC 5.2 01/10/2023   HGB 13.7 01/10/2023   HCT 41.8 01/10/2023   MCV 102.0 (H) 01/10/2023   PLT 171 01/10/2023    Radiographic Findings: No results found.  Impression: The encounter diagnosis was Nodule of upper lobe of left lung [R91.1].   LUL nodule, PET positive    History of left breast cancer in 2015; s/p left lumpectomy, chemotherapy, XRT and antiestrogens     The patient is recovering well from the effects of radiation therapy.   Plan:  Follow up chest CT in 3 months. Routine office visit 3-4 days after scan to review results. Patient knows to call with any questions or concerns in the meantime.   Patient is currently scheduled to see Dr. Myna Hidalgo next week. He would like to see her after the CT scan is done, so the family will work on getting that rescheduled after her scan.  ____________________________________   Joyice Faster, PA-C   Billie Lade, PhD, MD  This document serves as a record of services personally performed by Antony Blackbird, MD. It was created on his behalf by Neena Rhymes, a trained medical  scribe. The creation of this record is based on the scribe's personal observations and the provider's statements to them. This document has been checked and approved by the attending provider.

## 2023-03-24 ENCOUNTER — Encounter: Payer: Self-pay | Admitting: Radiation Oncology

## 2023-03-24 ENCOUNTER — Other Ambulatory Visit: Payer: Self-pay

## 2023-03-24 ENCOUNTER — Ambulatory Visit
Admission: RE | Admit: 2023-03-24 | Discharge: 2023-03-24 | Disposition: A | Discharge status: Home / Self Care | Payer: Medicare PPO | Source: Ambulatory Visit | Attending: Radiation Oncology | Admitting: Radiation Oncology

## 2023-03-24 VITALS — BP 112/64 | HR 64 | Temp 96.8°F | Resp 20 | Ht 69.0 in | Wt 146.0 lb

## 2023-03-24 DIAGNOSIS — C50012 Malignant neoplasm of nipple and areola, left female breast: Secondary | ICD-10-CM | POA: Diagnosis not present

## 2023-03-24 DIAGNOSIS — R911 Solitary pulmonary nodule: Secondary | ICD-10-CM | POA: Insufficient documentation

## 2023-03-24 HISTORY — DX: Personal history of irradiation: Z92.3

## 2023-03-24 NOTE — Progress Notes (Signed)
Kim [name] is here today for follow up post radiation to the lung.  Lung Side: Left, patient completed on 02/14/23  Does the patient complain of any of the following: Pain:No Shortness of breath w/wo exertion: No Cough: No Hemoptysis: No Pain with swallowing: No Swallowing/choking concerns: No Appetite: Good Energy Level: low Post radiation skin Changes: No    Additional comments if applicable:   BP 112/64 (BP Location: Right Arm, Patient Position: Sitting, Cuff Size: Normal)   Pulse 64   Temp (!) 96.8 F (36 C)   Resp 20   Ht 5\' 9"  (1.753 m)   Wt 146 lb (66.2 kg)   LMP 10/19/1991   SpO2 100%   BMI 21.56 kg/m

## 2023-03-28 ENCOUNTER — Inpatient Hospital Stay: Payer: Medicare PPO | Admitting: Hematology & Oncology

## 2023-03-28 ENCOUNTER — Inpatient Hospital Stay: Payer: Medicare PPO

## 2023-04-21 DIAGNOSIS — I5032 Chronic diastolic (congestive) heart failure: Secondary | ICD-10-CM | POA: Diagnosis not present

## 2023-05-17 DIAGNOSIS — N1831 Chronic kidney disease, stage 3a: Secondary | ICD-10-CM | POA: Diagnosis not present

## 2023-05-17 DIAGNOSIS — C3490 Malignant neoplasm of unspecified part of unspecified bronchus or lung: Secondary | ICD-10-CM | POA: Diagnosis not present

## 2023-05-17 DIAGNOSIS — F0390 Unspecified dementia without behavioral disturbance: Secondary | ICD-10-CM | POA: Diagnosis not present

## 2023-05-17 DIAGNOSIS — E785 Hyperlipidemia, unspecified: Secondary | ICD-10-CM | POA: Diagnosis not present

## 2023-05-17 DIAGNOSIS — M810 Age-related osteoporosis without current pathological fracture: Secondary | ICD-10-CM | POA: Diagnosis not present

## 2023-05-17 DIAGNOSIS — N390 Urinary tract infection, site not specified: Secondary | ICD-10-CM | POA: Diagnosis not present

## 2023-05-17 DIAGNOSIS — I25118 Atherosclerotic heart disease of native coronary artery with other forms of angina pectoris: Secondary | ICD-10-CM | POA: Diagnosis not present

## 2023-05-17 DIAGNOSIS — I422 Other hypertrophic cardiomyopathy: Secondary | ICD-10-CM | POA: Diagnosis not present

## 2023-05-17 DIAGNOSIS — E039 Hypothyroidism, unspecified: Secondary | ICD-10-CM | POA: Diagnosis not present

## 2023-05-17 DIAGNOSIS — D692 Other nonthrombocytopenic purpura: Secondary | ICD-10-CM | POA: Diagnosis not present

## 2023-06-16 ENCOUNTER — Telehealth: Payer: Self-pay | Admitting: *Deleted

## 2023-06-16 NOTE — Telephone Encounter (Signed)
CALLED PATIENT'S DAUGHTER- SUSAN GLOVER TO INFORM OF CT FOR 06-24-23- ARRIVAL TIME- 12:45 PM @ [facility], NO RESTRICTIONS TO TEST, PATIENT TO FU WITH DR. KINARD ON 06-30-23 @ 9:45 AM AND THIS APPT. IS FOR HER MOM, Kim [name], LVM FOR A RETURN CALL

## 2023-06-20 ENCOUNTER — Telehealth: Payer: Self-pay | Admitting: *Deleted

## 2023-06-20 NOTE — Telephone Encounter (Signed)
Returned patient's daughter's phone call Cecille Aver), lvm for a return call

## 2023-06-22 DIAGNOSIS — M6281 Muscle weakness (generalized): Secondary | ICD-10-CM | POA: Diagnosis not present

## 2023-06-22 DIAGNOSIS — R2681 Unsteadiness on feet: Secondary | ICD-10-CM | POA: Diagnosis not present

## 2023-06-23 ENCOUNTER — Encounter: Payer: Self-pay | Admitting: Hematology & Oncology

## 2023-06-24 ENCOUNTER — Ambulatory Visit (HOSPITAL_COMMUNITY)
Admission: RE | Admit: 2023-06-24 | Discharge: 2023-06-24 | Disposition: A | Discharge status: Home / Self Care | Payer: Medicare PPO | Source: Ambulatory Visit | Attending: Radiology | Admitting: Radiology

## 2023-06-24 ENCOUNTER — Other Ambulatory Visit: Payer: Self-pay | Admitting: Internal Medicine

## 2023-06-24 DIAGNOSIS — R911 Solitary pulmonary nodule: Secondary | ICD-10-CM | POA: Insufficient documentation

## 2023-06-24 DIAGNOSIS — C3490 Malignant neoplasm of unspecified part of unspecified bronchus or lung: Secondary | ICD-10-CM | POA: Diagnosis not present

## 2023-06-24 DIAGNOSIS — I700 Atherosclerosis of aorta: Secondary | ICD-10-CM | POA: Diagnosis not present

## 2023-06-27 DIAGNOSIS — M6281 Muscle weakness (generalized): Secondary | ICD-10-CM | POA: Diagnosis not present

## 2023-06-27 DIAGNOSIS — M1712 Unilateral primary osteoarthritis, left knee: Secondary | ICD-10-CM | POA: Diagnosis not present

## 2023-06-27 DIAGNOSIS — R2681 Unsteadiness on feet: Secondary | ICD-10-CM | POA: Diagnosis not present

## 2023-06-30 ENCOUNTER — Ambulatory Visit
Admission: RE | Admit: 2023-06-30 | Discharge: 2023-06-30 | Disposition: A | Discharge status: Home / Self Care | Payer: Medicare PPO | Source: Ambulatory Visit | Attending: Radiation Oncology | Admitting: Radiation Oncology

## 2023-06-30 ENCOUNTER — Encounter: Payer: Self-pay | Admitting: Radiology

## 2023-06-30 DIAGNOSIS — Z170 Estrogen receptor positive status [ER+]: Secondary | ICD-10-CM

## 2023-06-30 DIAGNOSIS — R911 Solitary pulmonary nodule: Secondary | ICD-10-CM

## 2023-06-30 NOTE — Progress Notes (Signed)
I called the patient's daughter today with the results of her most recent CT. She was unable to talk, so I left a voicemail.   She was unable to come in for her scheduled follow-up today due to transportation issues. I reviewed the scan with Dr. Roselind Messier. Overall, her imaging looks good with no evidence of disease progression. She does have some lung scarring around the treated area. I informed the patient to let us know if she has experienced any changes to her breathing as this could potentially be a finding consistent with radiation pneumonitis/fibrosis. We will plan to follow-up with another CT scan in 6 months.     Joyice Faster, PA-C

## 2023-07-05 DIAGNOSIS — M6281 Muscle weakness (generalized): Secondary | ICD-10-CM | POA: Diagnosis not present

## 2023-07-05 DIAGNOSIS — R2681 Unsteadiness on feet: Secondary | ICD-10-CM | POA: Diagnosis not present

## 2023-07-07 DIAGNOSIS — R2681 Unsteadiness on feet: Secondary | ICD-10-CM | POA: Diagnosis not present

## 2023-07-07 DIAGNOSIS — M6281 Muscle weakness (generalized): Secondary | ICD-10-CM | POA: Diagnosis not present

## 2023-07-12 DIAGNOSIS — R2681 Unsteadiness on feet: Secondary | ICD-10-CM | POA: Diagnosis not present

## 2023-07-12 DIAGNOSIS — M6281 Muscle weakness (generalized): Secondary | ICD-10-CM | POA: Diagnosis not present

## 2023-08-09 DIAGNOSIS — M2011 Hallux valgus (acquired), right foot: Secondary | ICD-10-CM | POA: Diagnosis not present

## 2023-08-09 DIAGNOSIS — B351 Tinea unguium: Secondary | ICD-10-CM | POA: Diagnosis not present

## 2023-08-09 DIAGNOSIS — G609 Hereditary and idiopathic neuropathy, unspecified: Secondary | ICD-10-CM | POA: Diagnosis not present

## 2023-08-09 DIAGNOSIS — M79674 Pain in right toe(s): Secondary | ICD-10-CM | POA: Diagnosis not present

## 2023-10-06 DIAGNOSIS — N390 Urinary tract infection, site not specified: Secondary | ICD-10-CM | POA: Diagnosis not present

## 2023-10-11 DIAGNOSIS — E039 Hypothyroidism, unspecified: Secondary | ICD-10-CM | POA: Diagnosis not present

## 2023-10-11 DIAGNOSIS — K5900 Constipation, unspecified: Secondary | ICD-10-CM | POA: Diagnosis not present

## 2023-10-11 DIAGNOSIS — I10 Essential (primary) hypertension: Secondary | ICD-10-CM | POA: Diagnosis not present

## 2023-10-11 DIAGNOSIS — I4891 Unspecified atrial fibrillation: Secondary | ICD-10-CM | POA: Diagnosis not present

## 2023-10-11 DIAGNOSIS — D509 Iron deficiency anemia, unspecified: Secondary | ICD-10-CM | POA: Diagnosis not present

## 2023-10-11 DIAGNOSIS — E7800 Pure hypercholesterolemia, unspecified: Secondary | ICD-10-CM | POA: Diagnosis not present

## 2023-10-11 DIAGNOSIS — N390 Urinary tract infection, site not specified: Secondary | ICD-10-CM | POA: Diagnosis not present

## 2023-10-11 DIAGNOSIS — Z853 Personal history of malignant neoplasm of breast: Secondary | ICD-10-CM | POA: Diagnosis not present

## 2023-10-11 DIAGNOSIS — E440 Moderate protein-calorie malnutrition: Secondary | ICD-10-CM | POA: Diagnosis not present

## 2023-10-13 DIAGNOSIS — D519 Vitamin B12 deficiency anemia, unspecified: Secondary | ICD-10-CM | POA: Diagnosis not present

## 2023-10-13 DIAGNOSIS — E039 Hypothyroidism, unspecified: Secondary | ICD-10-CM | POA: Diagnosis not present

## 2023-10-13 DIAGNOSIS — I10 Essential (primary) hypertension: Secondary | ICD-10-CM | POA: Diagnosis not present

## 2023-10-13 DIAGNOSIS — E559 Vitamin D deficiency, unspecified: Secondary | ICD-10-CM | POA: Diagnosis not present

## 2023-10-13 DIAGNOSIS — E785 Hyperlipidemia, unspecified: Secondary | ICD-10-CM | POA: Diagnosis not present

## 2023-10-14 DIAGNOSIS — F330 Major depressive disorder, recurrent, mild: Secondary | ICD-10-CM | POA: Diagnosis not present

## 2023-10-27 DIAGNOSIS — R32 Unspecified urinary incontinence: Secondary | ICD-10-CM | POA: Diagnosis not present

## 2023-10-27 DIAGNOSIS — R350 Frequency of micturition: Secondary | ICD-10-CM | POA: Diagnosis not present

## 2023-10-27 DIAGNOSIS — N390 Urinary tract infection, site not specified: Secondary | ICD-10-CM | POA: Diagnosis not present

## 2023-10-31 DIAGNOSIS — D62 Acute posthemorrhagic anemia: Secondary | ICD-10-CM | POA: Diagnosis not present

## 2023-10-31 DIAGNOSIS — D696 Thrombocytopenia, unspecified: Secondary | ICD-10-CM | POA: Diagnosis not present

## 2023-11-02 DIAGNOSIS — R2681 Unsteadiness on feet: Secondary | ICD-10-CM | POA: Diagnosis not present

## 2023-11-02 DIAGNOSIS — M6281 Muscle weakness (generalized): Secondary | ICD-10-CM | POA: Diagnosis not present

## 2023-11-07 DIAGNOSIS — M6281 Muscle weakness (generalized): Secondary | ICD-10-CM | POA: Diagnosis not present

## 2023-11-07 DIAGNOSIS — R2681 Unsteadiness on feet: Secondary | ICD-10-CM | POA: Diagnosis not present

## 2023-11-08 DIAGNOSIS — M6281 Muscle weakness (generalized): Secondary | ICD-10-CM | POA: Diagnosis not present

## 2023-11-08 DIAGNOSIS — R2681 Unsteadiness on feet: Secondary | ICD-10-CM | POA: Diagnosis not present

## 2023-11-09 ENCOUNTER — Other Ambulatory Visit (HOSPITAL_COMMUNITY): Payer: Self-pay | Admitting: Cardiology

## 2023-11-10 DIAGNOSIS — E039 Hypothyroidism, unspecified: Secondary | ICD-10-CM | POA: Diagnosis not present

## 2023-11-10 DIAGNOSIS — N390 Urinary tract infection, site not specified: Secondary | ICD-10-CM | POA: Diagnosis not present

## 2023-11-10 DIAGNOSIS — E559 Vitamin D deficiency, unspecified: Secondary | ICD-10-CM | POA: Diagnosis not present

## 2023-11-10 DIAGNOSIS — R2681 Unsteadiness on feet: Secondary | ICD-10-CM | POA: Diagnosis not present

## 2023-11-10 DIAGNOSIS — M6281 Muscle weakness (generalized): Secondary | ICD-10-CM | POA: Diagnosis not present

## 2023-11-10 DIAGNOSIS — I480 Paroxysmal atrial fibrillation: Secondary | ICD-10-CM | POA: Diagnosis not present

## 2023-11-10 DIAGNOSIS — N183 Chronic kidney disease, stage 3 unspecified: Secondary | ICD-10-CM | POA: Diagnosis not present

## 2023-11-10 DIAGNOSIS — I129 Hypertensive chronic kidney disease with stage 1 through stage 4 chronic kidney disease, or unspecified chronic kidney disease: Secondary | ICD-10-CM | POA: Diagnosis not present

## 2023-11-11 DIAGNOSIS — D62 Acute posthemorrhagic anemia: Secondary | ICD-10-CM | POA: Diagnosis not present

## 2023-11-11 DIAGNOSIS — F4321 Adjustment disorder with depressed mood: Secondary | ICD-10-CM | POA: Diagnosis not present

## 2023-11-11 DIAGNOSIS — D696 Thrombocytopenia, unspecified: Secondary | ICD-10-CM | POA: Diagnosis not present

## 2023-11-14 DIAGNOSIS — M6281 Muscle weakness (generalized): Secondary | ICD-10-CM | POA: Diagnosis not present

## 2023-11-14 DIAGNOSIS — R2681 Unsteadiness on feet: Secondary | ICD-10-CM | POA: Diagnosis not present

## 2023-11-18 DIAGNOSIS — M6281 Muscle weakness (generalized): Secondary | ICD-10-CM | POA: Diagnosis not present

## 2023-11-18 DIAGNOSIS — R2681 Unsteadiness on feet: Secondary | ICD-10-CM | POA: Diagnosis not present

## 2023-11-25 DIAGNOSIS — M6281 Muscle weakness (generalized): Secondary | ICD-10-CM | POA: Diagnosis not present

## 2023-11-25 DIAGNOSIS — R2681 Unsteadiness on feet: Secondary | ICD-10-CM | POA: Diagnosis not present

## 2023-11-29 DIAGNOSIS — M6281 Muscle weakness (generalized): Secondary | ICD-10-CM | POA: Diagnosis not present

## 2023-11-29 DIAGNOSIS — R2681 Unsteadiness on feet: Secondary | ICD-10-CM | POA: Diagnosis not present

## 2023-12-22 ENCOUNTER — Telehealth: Payer: Self-pay | Admitting: *Deleted

## 2023-12-22 NOTE — Telephone Encounter (Signed)
 Called patient to inform of CT for 12-28-23- arrival time- 3:45 pm @ [facility], no restrictions to scan, patient to receive results from Dr. Roselind Messier on 01-02-24 @ 11:45 am, lvm for a return call

## 2023-12-28 ENCOUNTER — Ambulatory Visit (HOSPITAL_COMMUNITY): Attending: Radiology

## 2024-01-02 ENCOUNTER — Ambulatory Visit: Payer: Self-pay | Admitting: Radiation Oncology

## 2024-08-20 ENCOUNTER — Telehealth (HOSPITAL_COMMUNITY): Payer: Self-pay | Admitting: *Deleted

## 2024-08-20 NOTE — Telephone Encounter (Signed)
 Called to confirm/remind patient of their appointment at the [facility] on 08/21/24:       Appointment:              [] Confirmed             [x] Left mess              [] No answer/No voice mail             [] Phone not in service   Patient reminded to bring all medications and/or complete list.   Confirmed patient has transportation. Gave directions, instructed to utilize valet parking.

## 2024-08-20 NOTE — Progress Notes (Incomplete)
 [facility] NOTE  PCP: Valentin Skates, DO Oncologist: Dr. Timmy Cardiology: Dr. Rolan  HPI: 89 y.o. with history of CAD, HCM, paroxysmal atrial fibrillation, and mitral regurgitation presents for followup of HCM, CHF, MR.  Patient had breast cancer in 2015 and received treatment involving Herceptin .  During breast cancer treatment, she developed unstable angina and ended up getting a DES to the mid RCA in 3/15.  Patient additionally has a history of HOCM.  This has been recognized on prior echoes.  She has severe asymmetric basal septal hypertrophy and SAM with LVOT gradient peak 58 mmHg on echo in 3/15 along with moderate MR.  Repeat echo in 7/15 showed LVOT gradient down to 30 mmHg on higher beta blocker.  Repeat echo in 11/15 showed no significant LVOT gradient but SAM still present.  Echo (8/16) showed asymmetric septal hypertrophy, No SAM, no significant LVOT gradient.  Echo 4/18 showed EF 55-60%, small LVOT gradient with severe asymmetric septal hypertrophy, mild MR, PASP 32 mmHg.    She was admitted in 1/20 with symptomatic atrial fibrillation with RVR, this was a new diagnosis.  She was started on Eliquis  and cardioverted after TEE back to NSR.  TEE showed severe mitral regurgitation that appeared to be due mostly to posterior leaflet prolapse, there was minimal systolic anterior motion.   In 3/20, she had Mitraclip placement. Echo in 6/20 showed EF 60-65%, severe asymmetric septal hypertrophy, normal RV, s/p Mitraclip with mean gradient 3 mmHg and trivial MR.  Echo in 3/21 showed EF 65%, asymmetric septal hypertrophy with LVOT gradient 37 mmHg, SAM of clipped segment of the mitral valve, normal RV, severe LAE, s/p Mitraclip x 2 with mild MR/no MS.   In October she went to [facility] ED on 07/07/20 for chest pain. She was in A fib at that time. Work up negative so she was discharged later that day.   Had cardioversion 08/07/20 --> NSR  Evaluated in the ED on 4/22 for  presyncope on  01/22/21 and syncope 01/23/21. She does not recall tripping or dizziness. Bruising noted on face/arms. No acute findings. She declined cardiology consultation and preferred to be seen in the clinic. She was supposed to wear Zio patch but this was never done.  Carotid dopplers showed no significant disease in 5/22.   Echo in 5/22 showed EF 60-65% with several focal basal septal hypertrophy, there does not appear to be a significant LVOT gradient, s/p Mitraclip with mean gradient 6 mmHg and mild-moderate MR, RV normal, PASP 38 mmHg.   She developed symptomatic atrial fibrillation again and underwent DCCV in 6/22 on amiodarone . Zio monitor in 8/22 showed NSR, short runs of SVT, no AF.  She was admitted in 11/22 after a mechanical fall with a right hip fracture, now s/p repair. She was noted to be in atrial fibrillation during that admission.    S/P DCCV 2/23 with restoration NSR. Echo in 6/23 with EF 60-65%, severe asymmetric basal septal hypertrophy, RV normal, s/p Mitraclip with mild MR, IVC normal.   She returns today for heart failure follow up with her daughter. Last seen 3/24. Overall feeling well, wheelchair bound due to arthritis. Lives in SNF. NYHA I. Reports rare atypical chest pain, related to stress and anxiety. Has been given NTG x2 in the past 6 months for chest pain during the night. Denies dyspnea, fatigue, palpitations, falls, dizziness, and abnormal bleeding. Able to perform ADLs. Appetite okay. Compliant with all medications.   Labs (3/15): K 4.5, creatinine 0.84,  LDL 91, HDL 46 Labs (5/15): K 3.9, creatinine 1.1 Labs (12/15): LDL 80, HDL 28, hemoglobin 10.9 Labs (1/16): K 3.7, creatinine 0.8 Labs (2/16): K 3.6, creatinine 1.2, HCT 34.4 Labs (6/16): K 4.1, creatinine 1.24 Labs (7/17): K 4.1, creatinine 1.2, HCT 38.8, LDL 143 Labs (1/18): LDL 142 Labs (5/18): K 4.4, creatinine 1.0 Labs (1/20): K 3.9, creatinine 1.27, LDL 95 Labs (8/20): K 3.8, creatinine 2.06, LFTs normal Labs  (12/20): K 4.1, creatinine 1.14 Labs (2/21): LDL 121 Labs (3/21): K 4.4, creatinine 1.27 Labs (01/23/21): K 3.5 Creatinine 1.2, TSH normal, BNP 385 Labs (5/22): K 4.3, creatinine 1.26, hgb 11.7, LDL 122 Labs (7/22): LFTs normal Labs (8/22): K 4.3, creatinine 1.45 Labs (11/22): K 3.7, creatinine 1.24 Labs (6/23): K 4.8, creatinine 1.22, LFTs normal  PMH: 1. CAD: Unstable angina 3/15 with LHC showing 99% mRCA stenosis, treated with DES to mRCA.  2. Hypothyroidism 3. Diverticulosis 4. HTN 5. H/o TAH/BSO 6. Breast cancer: s/p lumpectomy with lymph node biopsy in 2/15.  4/10 nodes positive.  She was started on docetaxol/carboplatin /Herceptin  in 3/15 with plan for 6 cycles chemo.  7. Hypertrophic obstructive cardiomyopathy: Echo (3/15) with severe focal basal septal hypertrophy (22 mm), narrow LV outflow tract with mitral valve SAM and 58 mmHg peak LVOT resting gradient, EF 60-65%, moderate MR, moderate LAE, normal RV, lateral s' 10.4 cm/sec.  Patient says that her 2 grown sons has had echoes to screen for HOCM.  Echo (4/15) with EF 65-70%, severe focal basal septal hypertrophy, LVOT gradient 33 mmHg, SAM was present with moderate MR, normal RV size and systolic function, lateral S' 89.3, GLS -17.5%.  Cardiac MRI (5/15) with EF 65%, moderate asymmetric septal hypertrophy, systolic anterior motion of the mitral valve with moderate MR, there was no delayed enhancement.  Echo (7/15) with EF 65-70%, moderate ASH, peak LVOT gradient 30 mmHg, SAM with moderate MR, moderate to severe LAE.  Echo (11/15) with EF 55-60%, GLS -15%, no significant LVOT gradient, systolic anterior motion of the mitral valve with mild MR, normal RV size and systolic function. Echo (4/14) with EF 55-60%, severe asymmetric septal hypertrophy, LVOT gradient 20 mmHg, mild-moderate MR, GLS -18%.  - Echo (8/16): EF 60-65%, asymmetric septal hypertrophy, no significant LV outflow tract gradient, mild MR.   - Echo (4/18): EF 55-60%, small  LVOT gradient with severe asymmetric septal hypertrophy, mild MR, PASP 32 mmHg.   - TEE (1/20): EF 55-60%, moderate-severe asymmetric septal hypertrophy, no LVOT gradient, normal RV size and systolic function, severe LAE, severe MR with posterior MV leaflet prolapse and minimal SAM.   - Echo (6/20): EF 60-65%, severe asymmetric septal hypertrophy, normal RV, s/p Mitraclip with mean gradient 3 mmHg and trivial MR.   - Echo (3/21): EF 65%, asymmetric septal hypertrophy with LVOT gradient 37 mmHg, SAM of clipped segment of the mitral valve, normal RV, severe LAE, s/p Mitraclip x 2 with mild MR/no MS.  - Echo (5/22): EF 60-65% with several focal basal septal hypertrophy, There was no significant LVOT gradient, s/p Mitraclip with mean gradient 6 mmHg and mild-moderate MR, RV normal, PASP 38 mmHg.  - Echo (6/23): EF 60-65%, severe asymmetric basal septal hypertrophy, RV normal, s/p Mitraclip with mild MR, IVC normal.  8. Hyperlipidemia: Myalgias with atorvastatin , myalgias with Repatha .  9. Atrial fibrillation/atypical flutter: Now chronic - DCCV 11/21.  - DCCV 6/22 - Zio monitor (8/22): NSR, rare short SVT, no AF.  - DCCV 2/23 10. Mitral regurgitation: Appears severe on 1/20 TEE.  Has posterior leaflet prolapse with minimal SAM.  - Mitraclip 3/20.  11. Right hip fracture 11/22 with repair.   SH: Married, 2 children, retired, nonsmoker.   FH: No family history of HOCM or sudden death.  There is a family history of CAD.   ROS: All systems reviewed and negative except as per HPI.    Current Outpatient Medications  Medication Sig Dispense Refill   acetaminophen  (TYLENOL ) 500 MG tablet Take 1,000 mg by mouth 2 (two) times daily.     diclofenac  Sodium (VOLTAREN ) 1 % GEL Apply 4 g topically 2 (two) times daily as needed (knee pain).     diltiazem  (CARDIZEM  CD) 240 MG 24 hr capsule Take 1 capsule (240 mg total) by mouth daily.     docusate sodium  (COLACE) 100 MG capsule Take 100 mg by mouth as needed  for mild constipation.     ELIQUIS  2.5 MG TABS tablet Take 2.5 mg by mouth 2 (two) times daily.     exemestane  (AROMASIN ) 25 MG tablet TAKE 1 TABLET BY MOUTH ONCE DAILY AFTER BREAKFAST 90 tablet 3   feeding supplement (ENSURE ENLIVE / ENSURE PLUS) LIQD Take 237 mLs by mouth 2 (two) times daily between meals. 237 mL 12   ferrous gluconate  (FERGON) 324 MG tablet Take 324 mg by mouth daily with lunch.     gabapentin  (NEURONTIN ) 100 MG capsule Take 100 mg by mouth daily.     levothyroxine  (SYNTHROID ) 137 MCG tablet Take 137 mcg by mouth daily before breakfast.     metoprolol  succinate (TOPROL -XL) 25 MG 24 hr tablet TAKE 1 TABLET BY MOUTH TWICE A DAY.**DO NOT CRUSH** 60 tablet 11   nitroGLYCERIN  (NITROSTAT ) 0.4 MG SL tablet Place 1 tablet (0.4 mg total) under the tongue every 5 (five) minutes as needed for chest pain. 100 tablet 3   ondansetron  (ZOFRAN ) 4 MG tablet Take 1 tablet (4 mg total) by mouth every 6 (six) hours as needed for nausea. 20 tablet 0   polyethylene glycol (MIRALAX  / GLYCOLAX ) 17 g packet Take 17 g by mouth daily as needed for mild constipation. 14 each 0   senna-docusate (SENOKOT-S) 8.6-50 MG tablet Take 1 tablet by mouth at bedtime as needed for mild constipation.     tamsulosin (FLOMAX) 0.4 MG CAPS capsule Take 0.4 mg by mouth.     triamcinolone  cream (KENALOG) 0.1 % Apply 1 Application topically 2 (two) times daily as needed.     vitamin B-12 (CYANOCOBALAMIN ) 1000 MCG tablet Take 1,000 mcg by mouth daily.     amoxicillin (AMOXIL) 500 MG capsule Take 2,000 mg by mouth. One hour before dental work. (Patient not taking: Reported on 08/21/2024)     furosemide  (LASIX ) 40 MG tablet TAKE 1 TABLET BY MOUTH AS NEEDED FOR FLUID OR EDEMA. . (Patient not taking: Reported on 08/21/2024) 30 tablet 11   menthol -cetylpyridinium (CEPACOL) 3 MG lozenge Take 1 lozenge (3 mg total) by mouth as needed for sore throat. 100 tablet 12   Multiple Vitamin (MULTIVITAMIN WITH MINERALS) TABS tablet Take 1  tablet by mouth daily.     potassium chloride  SA (KLOR-CON  M) 20 MEQ tablet TAKE 1/2 TO 1 TABLET (10-20 MEQ TOTAL) BY MOUTH AS NEEDED. ONLY TAKE 1 FULL TABLET (20 MEQ) IF YOU TAKE LASIX . (Patient not taking: Reported on 08/21/2024) 30 tablet 11   No current facility-administered medications for this encounter.   BP 138/74   Pulse 77   Ht 5' 8 (1.727 m)   Wt 68 kg (  150 lb)   LMP 10/19/1991   SpO2 99%   BMI 22.81 kg/m   Vitals:   08/21/24 0855  BP: 138/74  Pulse: 77  SpO2: 99%    Wt Readings from Last 3 Encounters:  08/21/24 68 kg (150 lb)  03/24/23 66.2 kg (146 lb)  01/19/23 67.6 kg (149 lb)   Physical Exam: General: elderly appearing. No distress  Cardiac: JVP flat. No murmurs  Extremities: Warm and dry.  No edema.  Neuro: A&O x3. Affect pleasant.   ECG (personally reviewed): AFL 73 bpm  Assessment/Plan: 1. CAD: Status post PCI for unstable angina in 3/15 with DES to [facility]. Rare chest pain related to anxiety.   - No ASA, on Eliquis .     - Unable to tolerate statins or Repatha . 2. Hyperlipidemia: Myalgias with Crestor , Lipitor, and pravastatin .  Myalgias with Repatha . Did not tolerate Zetia .  3. Hypertrophic obstructive cardiomyopathy: No family history of HOCM or sudden death (interestingly, her husband has HOCM). Cardiac MRI in 5/15 showed no delayed enhancement.  Echo in 3/21 (post-Mitraclip) showed asymmetric septal hypertrophy with LVOT gradient peak 37 mmHg, SAM of the clipped segment of the mitral valve with mild MR, no MS.  Echo in 5/22 showed asymmetric septal hypertrophy but there was no significant LVOT gradient.   Echo in 6/23 with EF 60-65%, severe asymmetric basal septal hypertrophy but no significant LVOT gradient, RV normal, s/p Mitraclip with mild MR, IVC normal. No murmur on exam. - will hold off on repeat echo, asymptomatic. Discussed that if this changes, we will repeat. - continue diltiazem  CD 240 mg daily.  - continue Toprol  XL to 25 mg bid - Not  candidate for mavacamten with no significant LVOT gradient detected.  - Patient's daughter still needs echo screening for HCM (discussed again today, daughter will bring up with her PCP), her son has been screened.     4. Atrial fibrillation: Permanent. Noted initially in 1/20, suspect this was triggered by mitral regurgitation and HCM.  She was cardioverted to NSR.  She was on amiodarone  for a few months, this was stopped after Mitraclip. In A fib 07/07/20. Had successful cardioversion 08/07/20 and in 6/22.  She was in atrial fibrillation in 11/22 when she fractured her hip and is in atypical atrial flutter. S/P DC-CV 11/26/21 with restoration to NSR but she went back into atypical atrial flutter quickly despite amiodarone  use. Now off amio. - ECG with AFL today, rate controlled - continue Toprol  XL to 25 mg bid.  - continue diltiazem .  - continue Eliquis  2.5 mg bid (age, Cr) . CBC today.  5. Chronic HFpEF:  Echo in 6/23 with normal EF and normal RV, no significant LVOT gradient was measured.  - NYHA class I, but minimally active.  Euvolemic - Does not need diuretic at this time 6. Mitral regurgitation: Suspect that mitral regurgitation may have been a trigger for atrial fibrillation.  She has now had Mitraclip placement, echo in 6/23 showed mild MR. - Antibiotics with dental work.     Follow up in 6 months with Dr. McLean  Teriah Muela, NP 08/21/2024

## 2024-08-21 ENCOUNTER — Ambulatory Visit (HOSPITAL_COMMUNITY): Payer: Self-pay | Admitting: Cardiology

## 2024-08-21 ENCOUNTER — Ambulatory Visit (HOSPITAL_COMMUNITY)
Admission: RE | Admit: 2024-08-21 | Discharge: 2024-08-21 | Disposition: A | Source: Ambulatory Visit | Attending: Cardiology

## 2024-08-21 ENCOUNTER — Encounter (HOSPITAL_COMMUNITY): Payer: Self-pay

## 2024-08-21 VITALS — BP 138/74 | HR 77 | Ht 68.0 in | Wt 150.0 lb

## 2024-08-21 DIAGNOSIS — I340 Nonrheumatic mitral (valve) insufficiency: Secondary | ICD-10-CM | POA: Insufficient documentation

## 2024-08-21 DIAGNOSIS — Z993 Dependence on wheelchair: Secondary | ICD-10-CM | POA: Diagnosis not present

## 2024-08-21 DIAGNOSIS — I5032 Chronic diastolic (congestive) heart failure: Secondary | ICD-10-CM | POA: Insufficient documentation

## 2024-08-21 DIAGNOSIS — I484 Atypical atrial flutter: Secondary | ICD-10-CM | POA: Diagnosis not present

## 2024-08-21 DIAGNOSIS — Z853 Personal history of malignant neoplasm of breast: Secondary | ICD-10-CM | POA: Insufficient documentation

## 2024-08-21 DIAGNOSIS — Z952 Presence of prosthetic heart valve: Secondary | ICD-10-CM | POA: Insufficient documentation

## 2024-08-21 DIAGNOSIS — I421 Obstructive hypertrophic cardiomyopathy: Secondary | ICD-10-CM | POA: Diagnosis not present

## 2024-08-21 DIAGNOSIS — I422 Other hypertrophic cardiomyopathy: Secondary | ICD-10-CM | POA: Insufficient documentation

## 2024-08-21 DIAGNOSIS — F419 Anxiety disorder, unspecified: Secondary | ICD-10-CM | POA: Insufficient documentation

## 2024-08-21 DIAGNOSIS — I4821 Permanent atrial fibrillation: Secondary | ICD-10-CM | POA: Insufficient documentation

## 2024-08-21 DIAGNOSIS — Z7901 Long term (current) use of anticoagulants: Secondary | ICD-10-CM | POA: Insufficient documentation

## 2024-08-21 DIAGNOSIS — Z79899 Other long term (current) drug therapy: Secondary | ICD-10-CM | POA: Diagnosis not present

## 2024-08-21 DIAGNOSIS — I110 Hypertensive heart disease with heart failure: Secondary | ICD-10-CM | POA: Insufficient documentation

## 2024-08-21 DIAGNOSIS — I2510 Atherosclerotic heart disease of native coronary artery without angina pectoris: Secondary | ICD-10-CM | POA: Insufficient documentation

## 2024-08-21 DIAGNOSIS — E785 Hyperlipidemia, unspecified: Secondary | ICD-10-CM | POA: Insufficient documentation

## 2024-08-21 DIAGNOSIS — M1990 Unspecified osteoarthritis, unspecified site: Secondary | ICD-10-CM | POA: Diagnosis not present

## 2024-08-21 LAB — CBC
HCT: 41.7 % (ref 36.0–46.0)
Hemoglobin: 13.4 g/dL (ref 12.0–15.0)
MCH: 32.7 pg (ref 26.0–34.0)
MCHC: 32.1 g/dL (ref 30.0–36.0)
MCV: 101.7 fL — ABNORMAL HIGH (ref 80.0–100.0)
Platelets: 149 K/uL — ABNORMAL LOW (ref 150–400)
RBC: 4.1 MIL/uL (ref 3.87–5.11)
RDW: 12.6 % (ref 11.5–15.5)
WBC: 5.8 K/uL (ref 4.0–10.5)
nRBC: 0 % (ref 0.0–0.2)

## 2024-08-21 LAB — BASIC METABOLIC PANEL WITH GFR
Anion gap: 9 (ref 5–15)
BUN: 23 mg/dL (ref 8–23)
CO2: 24 mmol/L (ref 22–32)
Calcium: 9 mg/dL (ref 8.9–10.3)
Chloride: 104 mmol/L (ref 98–111)
Creatinine, Ser: 1.36 mg/dL — ABNORMAL HIGH (ref 0.44–1.00)
GFR, Estimated: 38 mL/min — ABNORMAL LOW (ref >60)
Glucose, Bld: 148 mg/dL — ABNORMAL HIGH (ref 70–99)
Potassium: 4.3 mmol/L (ref 3.5–5.1)
Sodium: 137 mmol/L (ref 135–145)

## 2024-08-21 NOTE — Patient Instructions (Signed)
 Medication Changes:  None, continue current medications  Lab Work:  Labs done today, your results will be available in MyChart, we will contact you for abnormal readings.   Special Instructions // Education:  Do the following things EVERYDAY: Weigh yourself in the morning before breakfast. Write it down and keep it in a log. Take your medicines as prescribed Eat low salt foods--Limit salt (sodium) to 2000 mg per day.  Stay as active as you can everyday Limit all fluids for the day to less than 2 liters   Follow-Up in: 6 months with Dr Rolan (May 2026), **PLEASE CALL OUR OFFICE IN MARCH TO SCHEDULE THIS APPOINTMENT   At the [facility], you and your health needs are our priority. We have a designated team specialized in the treatment of Heart Failure. This Care Team includes your primary Heart Failure Specialized Cardiologist (physician), Advanced Practice Providers (APPs- Physician Assistants and Nurse Practitioners), and Pharmacist who all work together to provide you with the care you need, when you need it.   You may see any of the following providers on your designated Care Team at your next follow up:  Dr. Toribio Fuel Dr. Ezra Rolan Dr. Odis Brownie Greig Mosses, NP Caffie Shed, [place] [facility] [place], [place] Beckey Coe, NP Jordan Lee, NP Tinnie Redman, PharmD   Please be sure to bring in all your medications bottles to every appointment.   Need to Contact Us :  If you have any questions or concerns before your next appointment please send us  a message through [place] or call our office at [phone].    TO LEAVE A MESSAGE FOR THE NURSE SELECT OPTION 2, PLEASE LEAVE A MESSAGE INCLUDING: YOUR NAME DATE OF BIRTH CALL BACK NUMBER REASON FOR CALL**this is important as we prioritize the call backs  YOU WILL RECEIVE A CALL BACK THE SAME DAY AS LONG AS YOU CALL BEFORE 4:00 PM
